# Patient Record
Sex: Male | Born: 1970 | Race: Black or African American | Hispanic: No | Marital: Single | Smoking: Never smoker
Health system: Southern US, Community
[De-identification: ages and names within clinical notes are randomized; demographics above are authoritative.]

## PROBLEM LIST (undated history)

## (undated) HISTORY — PX: OTHER SURGICAL HISTORY: SHX169

---

## 2012-07-11 ENCOUNTER — Encounter: Payer: Self-pay | Admitting: Internal Medicine

## 2012-07-31 ENCOUNTER — Encounter: Payer: Self-pay | Admitting: Internal Medicine

## 2012-08-01 ENCOUNTER — Other Ambulatory Visit (INDEPENDENT_AMBULATORY_CARE_PROVIDER_SITE_OTHER): Payer: PRIVATE HEALTH INSURANCE

## 2012-08-01 ENCOUNTER — Ambulatory Visit (INDEPENDENT_AMBULATORY_CARE_PROVIDER_SITE_OTHER): Payer: PRIVATE HEALTH INSURANCE | Admitting: Internal Medicine

## 2012-08-01 ENCOUNTER — Encounter: Payer: Self-pay | Admitting: Internal Medicine

## 2012-08-01 VITALS — BP 120/80 | HR 66 | Ht 62.0 in | Wt 175.8 lb

## 2012-08-01 DIAGNOSIS — R195 Other fecal abnormalities: Secondary | ICD-10-CM

## 2012-08-01 DIAGNOSIS — Z8719 Personal history of other diseases of the digestive system: Secondary | ICD-10-CM

## 2012-08-01 DIAGNOSIS — R140 Abdominal distension (gaseous): Secondary | ICD-10-CM

## 2012-08-01 DIAGNOSIS — R141 Gas pain: Secondary | ICD-10-CM

## 2012-08-01 LAB — TSH: TSH: 1.17 u[IU]/mL (ref 0.35–5.50)

## 2012-08-01 MED ORDER — RESTORA PO CAPS: 1.0000 | ORAL_CAPSULE | Freq: Every day | ORAL | Status: AC

## 2012-08-01 NOTE — Progress Notes (Signed)
Patient ID: Alex [name], male   DOB: Feb 11, 1971, 42 y.o.   MRN: [id number] HPI: Alex [name] is a 42 year old male with little past medical history who was seen in consultation at the request of Lannie Fields, FNP for evaluation of abdominal bloating and loose stools. The patient reports in late April 2014 he developed abdominal bloating and foul-smelling loose stools which lasted approximately 3 weeks. About a week into his symptoms he was seen in urgent care and submitted stool studies, which he never heard the results of. He was told that he may have developed a bacterial or viral infection, and was placed on clears for a few days, then a brat diet and then eventually returned to normal diet. He reports his abdominal bloating and foul-smelling stools have resolved entirely at this point. Overall symptoms lasted about one month. He is now having 2 formed brown stools daily without bloating, abdominal pain, diarrhea, or constipation. He denies rectal bleeding or melena. No fevers or chills. No nausea or vomiting. No significant heartburn, dysphagia or odynophagia.  There are no active problems to display for this patient.   Past Surgical History  Procedure Laterality Date  . Wisdon teeth      Current Outpatient Prescriptions  Medication Sig Dispense Refill  . Ascorbic Acid (VITAMIN C) 100 MG tablet Take by mouth daily.      . Multiple Vitamin CHEW Chew by mouth.      Marland Kitchen omeprazole (PRILOSEC) 40 MG capsule Take 40 mg by mouth daily.      . Probiotic Product (RESTORA) CAPS Take 1 capsule by mouth daily.  30 capsule  0   No current facility-administered medications for this visit.    No Known Allergies  Family History  Problem Relation Age of Onset  . Heart disease Father   . Heart disease Mother   . Breast cancer Mother     History  Substance Use Topics  . Smoking status: Never Smoker   . Smokeless tobacco: Never Used  . Alcohol Use: No    ROS: As per history of present  illness, otherwise negative  BP 120/80  Pulse 66  Ht 5\' 2"  (1.575 m)  Wt 175 lb 12.8 oz (79.742 kg)  BMI 32.15 kg/m2 Constitutional: Well-developed and well-nourished. No distress. HEENT: Normocephalic and atraumatic. Oropharynx is clear and moist. No oropharyngeal exudate. Conjunctivae are normal.  No scleral icterus. Neck: Neck supple. Trachea midline. Cardiovascular: Normal rate, regular rhythm and intact distal pulses. No M/R/G Pulmonary/chest: Effort normal and breath sounds normal. No wheezing, rales or rhonchi. Abdominal: Soft, nontender, nondistended. Bowel sounds active throughout. There are no masses palpable. No hepatosplenomegaly. Extremities: no clubbing, cyanosis, or edema Lymphadenopathy: No cervical adenopathy noted. Neurological: Alert and oriented to person place and time. Skin: Skin is warm and dry. No rashes noted. Psychiatric: Normal mood and affect. Behavior is normal.  ASSESSMENT/PLAN: 42 year old male with little past medical history who was seen in consultation at the request of Lannie Fields, FNP for evaluation of abdominal bloating and loose stools.   1.  Resolved gastroenteritis/loose stools/abd bloating -- it sounds that he likely had an infectious gastroenteritis, likely viral in nature. It also sounds that he had some postinfectious irritable bowel but lasted almost a month. His symptoms have entirely resolved at this point, nothing further is felt necessary. I have recommended a probiotic and given him samples of Restora, should the symptoms recur. I will also check a TSH, as we discussed thyroid dysfunction can sometimes cause GI symptoms, and  he also feels like he is metabolically slower last several years and having a hard time maintaining a normal weight. He has gained some pounds over the last several years. He can followup as needed.

## 2012-08-01 NOTE — Patient Instructions (Addendum)
Your physician has requested that you go to the basement for the following lab work before leaving today: TSH  We have given you samples of Restora. This puts good bacteria back into your intestines. You should take 1 capsule by mouth once daily. If this works well for you, it can be purchased over the counter.  Follow up as needed                                               We are excited to introduce MyChart, a new best-in-class service that provides you online access to important information in your electronic medical record. We want to make it easier for you to view your health information - all in one secure location - when and where you need it. We expect MyChart will enhance the quality of care and service we provide.  When you register for MyChart, you can:    View your test results.    Request appointments and receive appointment reminders via email.    Request medication renewals.    View your medical history, allergies, medications and immunizations.    Communicate with your physician's office through a password-protected site.    Conveniently print information such as your medication lists.  To find out if MyChart is right for you, please talk to a member of our clinical staff today. We will gladly answer your questions about this free health and wellness tool.  If you are age 83 or older and want a member of your family to have access to your record, you must provide written consent by completing a proxy form available at our office. Please speak to our clinical staff about guidelines regarding accounts for patients younger than age 67.  As you activate your MyChart account and need any technical assistance, please call the MyChart technical support line at [phone]) [phone]-CHART [phone]) or email your question to [email or web address]. If you email your question(s), please include your name, a return phone number and the best time to reach you.  If you have non-urgent  health-related questions, you can send a message to our office through MyChart at [email or web address]. If you have a medical emergency, call 911.  Thank you for using MyChart as your new health and wellness resource!   MyChart licensed from [employer],  4540-9811. Patents Pending.

## 2012-08-09 ENCOUNTER — Telehealth: Payer: Self-pay | Admitting: *Deleted

## 2012-08-09 NOTE — Telephone Encounter (Signed)
Message copied by Florene Glen on Fri Aug 09, 2012 11:24 AM ------      Message from: Beverley Fiedler      Created: Thu Aug 01, 2012  1:26 PM       Normal thyroid function ------

## 2012-08-09 NOTE — Telephone Encounter (Signed)
Mailed pt a letter with results.  

## 2013-03-27 ENCOUNTER — Ambulatory Visit: Payer: PRIVATE HEALTH INSURANCE | Admitting: Physician Assistant

## 2015-05-31 ENCOUNTER — Ambulatory Visit: Payer: Self-pay | Admitting: Family Medicine

## 2020-03-08 ENCOUNTER — Emergency Department (HOSPITAL_BASED_OUTPATIENT_CLINIC_OR_DEPARTMENT_OTHER): Payer: HRSA Program

## 2020-03-08 ENCOUNTER — Emergency Department (HOSPITAL_BASED_OUTPATIENT_CLINIC_OR_DEPARTMENT_OTHER)
Admission: EM | Admit: 2020-03-08 | Discharge: 2020-03-08 | Disposition: A | Discharge status: Home / Self Care | Payer: HRSA Program | Attending: Emergency Medicine | Admitting: Emergency Medicine

## 2020-03-08 ENCOUNTER — Encounter (HOSPITAL_BASED_OUTPATIENT_CLINIC_OR_DEPARTMENT_OTHER): Payer: Self-pay | Admitting: *Deleted

## 2020-03-08 ENCOUNTER — Other Ambulatory Visit: Payer: Self-pay

## 2020-03-08 DIAGNOSIS — J1282 Pneumonia due to coronavirus disease 2019: Secondary | ICD-10-CM | POA: Diagnosis not present

## 2020-03-08 DIAGNOSIS — R509 Fever, unspecified: Secondary | ICD-10-CM | POA: Diagnosis present

## 2020-03-08 DIAGNOSIS — U071 COVID-19: Secondary | ICD-10-CM

## 2020-03-08 LAB — RESP PANEL BY RT-PCR (FLU A&B, COVID) ARPGX2
Influenza A by PCR: NEGATIVE
Influenza B by PCR: NEGATIVE
SARS Coronavirus 2 by RT PCR: POSITIVE — AB

## 2020-03-08 MED ORDER — ONDANSETRON 4 MG PO TBDP: 4.0000 mg | ORAL_TABLET | Freq: Three times a day (TID) | ORAL | 0 refills | Status: AC | PRN

## 2020-03-08 NOTE — ED Notes (Signed)
Pulse ox with ambulation: 93% 108 HR

## 2020-03-08 NOTE — Discharge Instructions (Addendum)
Please follow isolation precautions as discussed.  You should remain in isolation for at least 10 days from onset of symptoms and should be symptom and fever free.  Take Tylenol and Motrin as needed for fevers and body aches.  If you develop any difficulty in breathing, chest pain or other new concerning symptom, return to ER for reassessment.

## 2020-03-08 NOTE — ED Triage Notes (Signed)
Fatigue, headache and fever from Thursday until last night.  Not vaccinated for Covid.

## 2020-03-08 NOTE — ED Provider Notes (Signed)
[facility] EMERGENCY DEPARTMENT Provider Note   CSN: [id number] Arrival date & time: 03/08/20  0753     History Chief Complaint  Patient presents with  . Fever  . Fatigue  . Headache    Alex [name] is a 49 y.o. male.  Presents to ER with concern for Covid symptoms.  Not vaccinated against Covid.  Since Thursday, patient has been having dull achy headache, fatigue, chills and low-grade fevers.  Unsure of his temperature.  Headache is not worsening of his life, not sudden onset.  Currently mild.  Having cough that is nonproductive and nonbloody.  Does not have any chest pain or shortness of breath associated with his symptoms at present.  Denies any medical problems.  Non-smoker.  HPI     History reviewed. No pertinent past medical history.  There are no problems to display for this patient.   Past Surgical History:  Procedure Laterality Date  . Wisdon teeth         Family History  Problem Relation Age of Onset  . Heart disease Father   . Heart disease Mother   . Breast cancer Mother     Social History   Tobacco Use  . Smoking status: Never Smoker  . Smokeless tobacco: Never Used  Substance Use Topics  . Alcohol use: Yes    Comment: occasionally  . Drug use: Never    Home Medications Prior to Admission medications   Medication Sig Start Date End Date Taking? Authorizing Provider  Ascorbic Acid (VITAMIN C) 100 MG tablet Take by mouth daily.    [provider]  Multiple Vitamin CHEW Chew by mouth.    [provider]  omeprazole (PRILOSEC) 40 MG capsule Take 40 mg by mouth daily.    [provider]  ondansetron (ZOFRAN ODT) 4 MG disintegrating tablet Take 1 tablet (4 mg total) by mouth every 8 (eight) hours as needed for nausea or vomiting. 03/08/20   Milagros Loll, MD  Probiotic Product (RESTORA) CAPS Take 1 capsule by mouth daily. 08/01/12   Pyrtle, Carie Caddy, MD    Allergies    Patient has no known  allergies.  Review of Systems   Review of Systems  Constitutional: Positive for chills. Negative for fever.  HENT: Negative for ear pain and sore throat.   Eyes: Negative for pain and visual disturbance.  Respiratory: Positive for cough and shortness of breath.   Cardiovascular: Negative for chest pain and palpitations.  Gastrointestinal: Negative for abdominal pain and vomiting.  Genitourinary: Negative for dysuria and hematuria.  Musculoskeletal: Negative for arthralgias and back pain.  Skin: Negative for color change and rash.  Neurological: Negative for seizures and syncope.  All other systems reviewed and are negative.   Physical Exam Updated Vital Signs BP (!) 141/90 (BP Location: Right Arm)   Pulse (!) 104   Temp 98.9 F (37.2 C) (Oral)   Resp 16   Ht 5\' 4"  (1.626 m)   Wt 72.6 kg   SpO2 94%   BMI 27.46 kg/m   Physical Exam Vitals and nursing note reviewed.  Constitutional:      Appearance: He is well-developed and well-nourished.  HENT:     Head: Normocephalic and atraumatic.  Eyes:     Conjunctiva/sclera: Conjunctivae normal.  Cardiovascular:     Rate and Rhythm: Normal rate and regular rhythm.     Heart sounds: No murmur heard.   Pulmonary:     Effort: Pulmonary effort is normal. No respiratory  distress.     Breath sounds: Normal breath sounds.  Abdominal:     Palpations: Abdomen is soft.     Tenderness: There is no abdominal tenderness.  Musculoskeletal:        General: No edema.     Cervical back: Neck supple.  Skin:    General: Skin is warm and dry.  Neurological:     Mental Status: He is alert.  Psychiatric:        Mood and Affect: Mood and affect and mood normal.        Behavior: Behavior normal.     ED Results / Procedures / Treatments   Labs (all labs ordered are listed, but only abnormal results are displayed) Labs Reviewed  RESP PANEL BY RT-PCR (FLU A&B, COVID) ARPGX2 - Abnormal; Notable for the following components:      Result  Value   SARS Coronavirus 2 by RT PCR POSITIVE (*)    All other components within normal limits    EKG None  Radiology DG Chest Portable 1 View  Result Date: 03/08/2020 CLINICAL DATA:  COVID, shortness of breath. EXAM: PORTABLE CHEST 1 VIEW COMPARISON:  None. FINDINGS: Bibasilar peripheral predominant airspace opacities. No visible pleural effusions or pneumothorax. Cardiac silhouette is accentuated by AP technique and low lung volumes. No acute osseous abnormality. IMPRESSION: Bibasilar peripheral predominant airspace opacities, compatible with multifocal pneumonia and reported COVID diagnosis. Electronically Signed   By: Feliberto Harts MD   On: 03/08/2020 08:42    Procedures Procedures (including critical care time)  Medications Ordered in ED Medications - No data to display  ED Course  I have reviewed the triage vital signs and the nursing notes.  Pertinent labs & imaging results that were available during my care of the patient were reviewed by me and considered in my medical decision making (see chart for details).    MDM Rules/Calculators/A&P                         49 year old male presenting to the emergency room with concern for cough, chills, fatigue and headache.  Covid test is positive.  CXR consistent with Covid pneumonia.  Presentation consistent with Covid.  On exam, patient currently is remarkably well-appearing.  Noted initial documented oxygen saturation of 91% on room air.  When I assessed patient O2 sats remained 93 to 94%.  Personally witnessed ambulation trial in room and patient had no desaturation episodes, and furthermore denies shortness of breath.  At present believe he is appropriate for outpatient management.  Reviewed strict return precautions should he develop worsening symptoms, any chest pain or any shortness of breath.  Patient demonstrated understanding as well as need for isolation precautions.  After the discussed management above, the patient was  determined to be safe for discharge.  The patient was in agreement with this plan and all questions regarding their care were answered.  ED return precautions were discussed and the patient will return to the ED with any significant worsening of condition.  Final Clinical Impression(s) / ED Diagnoses Final diagnoses:  COVID-19  Pneumonia due to COVID-19 virus    Rx / DC Orders ED Discharge Orders         Ordered    ondansetron (ZOFRAN ODT) 4 MG disintegrating tablet  Every 8 hours PRN        03/08/20 0940           Milagros Loll, MD 04-08-2020 [phone]

## 2020-03-08 NOTE — ED Notes (Signed)
ED Provider at bedside. 

## 2020-03-09 ENCOUNTER — Emergency Department (HOSPITAL_COMMUNITY): Payer: Medicaid Other

## 2020-03-09 ENCOUNTER — Inpatient Hospital Stay (HOSPITAL_COMMUNITY)
Admission: EM | Admit: 2020-03-09 | Discharge: 2020-06-11 | DRG: 003 | Disposition: E | Payer: Medicaid Other | Attending: Pulmonary Disease | Admitting: Pulmonary Disease

## 2020-03-09 ENCOUNTER — Encounter (HOSPITAL_COMMUNITY): Payer: Self-pay

## 2020-03-09 DIAGNOSIS — Z7189 Other specified counseling: Secondary | ICD-10-CM | POA: Diagnosis not present

## 2020-03-09 DIAGNOSIS — D6959 Other secondary thrombocytopenia: Secondary | ICD-10-CM | POA: Diagnosis present

## 2020-03-09 DIAGNOSIS — D6489 Other specified anemias: Secondary | ICD-10-CM | POA: Diagnosis present

## 2020-03-09 DIAGNOSIS — E861 Hypovolemia: Secondary | ICD-10-CM | POA: Diagnosis present

## 2020-03-09 DIAGNOSIS — J1282 Pneumonia due to coronavirus disease 2019: Secondary | ICD-10-CM

## 2020-03-09 DIAGNOSIS — X58XXXA Exposure to other specified factors, initial encounter: Secondary | ICD-10-CM | POA: Diagnosis not present

## 2020-03-09 DIAGNOSIS — Z9889 Other specified postprocedural states: Secondary | ICD-10-CM

## 2020-03-09 DIAGNOSIS — I2609 Other pulmonary embolism with acute cor pulmonale: Secondary | ICD-10-CM | POA: Diagnosis not present

## 2020-03-09 DIAGNOSIS — E871 Hypo-osmolality and hyponatremia: Secondary | ICD-10-CM | POA: Diagnosis present

## 2020-03-09 DIAGNOSIS — Z66 Do not resuscitate: Secondary | ICD-10-CM | POA: Diagnosis not present

## 2020-03-09 DIAGNOSIS — R1110 Vomiting, unspecified: Secondary | ICD-10-CM

## 2020-03-09 DIAGNOSIS — J9690 Respiratory failure, unspecified, unspecified whether with hypoxia or hypercapnia: Secondary | ICD-10-CM

## 2020-03-09 DIAGNOSIS — E1152 Type 2 diabetes mellitus with diabetic peripheral angiopathy with gangrene: Secondary | ICD-10-CM | POA: Diagnosis present

## 2020-03-09 DIAGNOSIS — Z79899 Other long term (current) drug therapy: Secondary | ICD-10-CM

## 2020-03-09 DIAGNOSIS — K567 Ileus, unspecified: Secondary | ICD-10-CM | POA: Diagnosis not present

## 2020-03-09 DIAGNOSIS — T85598A Other mechanical complication of other gastrointestinal prosthetic devices, implants and grafts, initial encounter: Secondary | ICD-10-CM

## 2020-03-09 DIAGNOSIS — J80 Acute respiratory distress syndrome: Secondary | ICD-10-CM | POA: Diagnosis present

## 2020-03-09 DIAGNOSIS — Z452 Encounter for adjustment and management of vascular access device: Secondary | ICD-10-CM

## 2020-03-09 DIAGNOSIS — N179 Acute kidney failure, unspecified: Secondary | ICD-10-CM | POA: Diagnosis present

## 2020-03-09 DIAGNOSIS — E870 Hyperosmolality and hypernatremia: Secondary | ICD-10-CM | POA: Diagnosis not present

## 2020-03-09 DIAGNOSIS — T790XXA Air embolism (traumatic), initial encounter: Secondary | ICD-10-CM | POA: Diagnosis not present

## 2020-03-09 DIAGNOSIS — F32A Depression, unspecified: Secondary | ICD-10-CM | POA: Diagnosis not present

## 2020-03-09 DIAGNOSIS — Z4659 Encounter for fitting and adjustment of other gastrointestinal appliance and device: Secondary | ICD-10-CM

## 2020-03-09 DIAGNOSIS — R451 Restlessness and agitation: Secondary | ICD-10-CM | POA: Diagnosis not present

## 2020-03-09 DIAGNOSIS — J9601 Acute respiratory failure with hypoxia: Secondary | ICD-10-CM | POA: Diagnosis present

## 2020-03-09 DIAGNOSIS — R319 Hematuria, unspecified: Secondary | ICD-10-CM | POA: Diagnosis not present

## 2020-03-09 DIAGNOSIS — J154 Pneumonia due to other streptococci: Secondary | ICD-10-CM | POA: Diagnosis not present

## 2020-03-09 DIAGNOSIS — Y832 Surgical operation with anastomosis, bypass or graft as the cause of abnormal reaction of the patient, or of later complication, without mention of misadventure at the time of the procedure: Secondary | ICD-10-CM | POA: Diagnosis not present

## 2020-03-09 DIAGNOSIS — J810 Acute pulmonary edema: Secondary | ICD-10-CM | POA: Diagnosis not present

## 2020-03-09 DIAGNOSIS — L8990 Pressure ulcer of unspecified site, unspecified stage: Secondary | ICD-10-CM | POA: Insufficient documentation

## 2020-03-09 DIAGNOSIS — E11649 Type 2 diabetes mellitus with hypoglycemia without coma: Secondary | ICD-10-CM | POA: Diagnosis not present

## 2020-03-09 DIAGNOSIS — K3184 Gastroparesis: Secondary | ICD-10-CM | POA: Diagnosis present

## 2020-03-09 DIAGNOSIS — I442 Atrioventricular block, complete: Secondary | ICD-10-CM | POA: Diagnosis present

## 2020-03-09 DIAGNOSIS — U071 COVID-19: Secondary | ICD-10-CM | POA: Diagnosis present

## 2020-03-09 DIAGNOSIS — I82622 Acute embolism and thrombosis of deep veins of left upper extremity: Secondary | ICD-10-CM | POA: Diagnosis present

## 2020-03-09 DIAGNOSIS — E8770 Fluid overload, unspecified: Secondary | ICD-10-CM | POA: Diagnosis not present

## 2020-03-09 DIAGNOSIS — Z4682 Encounter for fitting and adjustment of non-vascular catheter: Secondary | ICD-10-CM

## 2020-03-09 DIAGNOSIS — Z0189 Encounter for other specified special examinations: Secondary | ICD-10-CM

## 2020-03-09 DIAGNOSIS — Z978 Presence of other specified devices: Secondary | ICD-10-CM

## 2020-03-09 DIAGNOSIS — R198 Other specified symptoms and signs involving the digestive system and abdomen: Secondary | ICD-10-CM

## 2020-03-09 DIAGNOSIS — Z930 Tracheostomy status: Secondary | ICD-10-CM

## 2020-03-09 DIAGNOSIS — Z9911 Dependence on respirator [ventilator] status: Secondary | ICD-10-CM

## 2020-03-09 DIAGNOSIS — R339 Retention of urine, unspecified: Secondary | ICD-10-CM | POA: Diagnosis not present

## 2020-03-09 DIAGNOSIS — R0989 Other specified symptoms and signs involving the circulatory and respiratory systems: Secondary | ICD-10-CM

## 2020-03-09 DIAGNOSIS — Z515 Encounter for palliative care: Secondary | ICD-10-CM | POA: Diagnosis not present

## 2020-03-09 DIAGNOSIS — B954 Other streptococcus as the cause of diseases classified elsewhere: Secondary | ICD-10-CM | POA: Diagnosis present

## 2020-03-09 DIAGNOSIS — A4181 Sepsis due to Enterococcus: Secondary | ICD-10-CM | POA: Diagnosis not present

## 2020-03-09 DIAGNOSIS — G931 Anoxic brain damage, not elsewhere classified: Secondary | ICD-10-CM | POA: Diagnosis not present

## 2020-03-09 DIAGNOSIS — J95811 Postprocedural pneumothorax: Secondary | ICD-10-CM | POA: Diagnosis not present

## 2020-03-09 DIAGNOSIS — Z9689 Presence of other specified functional implants: Secondary | ICD-10-CM

## 2020-03-09 DIAGNOSIS — I82413 Acute embolism and thrombosis of femoral vein, bilateral: Secondary | ICD-10-CM | POA: Diagnosis present

## 2020-03-09 DIAGNOSIS — E1165 Type 2 diabetes mellitus with hyperglycemia: Secondary | ICD-10-CM | POA: Diagnosis present

## 2020-03-09 DIAGNOSIS — I468 Cardiac arrest due to other underlying condition: Secondary | ICD-10-CM | POA: Diagnosis not present

## 2020-03-09 DIAGNOSIS — B952 Enterococcus as the cause of diseases classified elsewhere: Secondary | ICD-10-CM

## 2020-03-09 DIAGNOSIS — R040 Epistaxis: Secondary | ICD-10-CM | POA: Diagnosis not present

## 2020-03-09 DIAGNOSIS — F419 Anxiety disorder, unspecified: Secondary | ICD-10-CM | POA: Diagnosis present

## 2020-03-09 DIAGNOSIS — J939 Pneumothorax, unspecified: Secondary | ICD-10-CM

## 2020-03-09 DIAGNOSIS — Z9281 Personal history of extracorporeal membrane oxygenation (ECMO): Secondary | ICD-10-CM

## 2020-03-09 DIAGNOSIS — J158 Pneumonia due to other specified bacteria: Secondary | ICD-10-CM | POA: Diagnosis not present

## 2020-03-09 DIAGNOSIS — E874 Mixed disorder of acid-base balance: Secondary | ICD-10-CM | POA: Diagnosis not present

## 2020-03-09 DIAGNOSIS — R7881 Bacteremia: Secondary | ICD-10-CM

## 2020-03-09 DIAGNOSIS — I10 Essential (primary) hypertension: Secondary | ICD-10-CM | POA: Diagnosis present

## 2020-03-09 DIAGNOSIS — R140 Abdominal distension (gaseous): Secondary | ICD-10-CM

## 2020-03-09 DIAGNOSIS — R092 Respiratory arrest: Secondary | ICD-10-CM

## 2020-03-09 DIAGNOSIS — L89152 Pressure ulcer of sacral region, stage 2: Secondary | ICD-10-CM | POA: Diagnosis not present

## 2020-03-09 DIAGNOSIS — S20212A Contusion of left front wall of thorax, initial encounter: Secondary | ICD-10-CM | POA: Diagnosis not present

## 2020-03-09 DIAGNOSIS — R0902 Hypoxemia: Secondary | ICD-10-CM

## 2020-03-09 DIAGNOSIS — E1143 Type 2 diabetes mellitus with diabetic autonomic (poly)neuropathy: Secondary | ICD-10-CM | POA: Diagnosis present

## 2020-03-09 DIAGNOSIS — E860 Dehydration: Secondary | ICD-10-CM | POA: Diagnosis present

## 2020-03-09 DIAGNOSIS — T859XXA Unspecified complication of internal prosthetic device, implant and graft, initial encounter: Secondary | ICD-10-CM

## 2020-03-09 DIAGNOSIS — Z01818 Encounter for other preprocedural examination: Secondary | ICD-10-CM

## 2020-03-09 LAB — BLOOD GAS, VENOUS
Acid-base deficit: 4.2 mmol/L — ABNORMAL HIGH (ref 0.0–2.0)
Bicarbonate: 21.2 mmol/L (ref 20.0–28.0)
O2 Saturation: 67.3 %
Patient temperature: 98.6
pCO2, Ven: 42 mmHg — ABNORMAL LOW (ref 44.0–60.0)
pH, Ven: 7.323 (ref 7.250–7.430)
pO2, Ven: 39.3 mmHg (ref 32.0–45.0)

## 2020-03-09 LAB — COMPREHENSIVE METABOLIC PANEL
ALT: 66 U/L — ABNORMAL HIGH (ref 0–44)
ALT: 70 U/L — ABNORMAL HIGH (ref 0–44)
AST: 76 U/L — ABNORMAL HIGH (ref 15–41)
AST: 96 U/L — ABNORMAL HIGH (ref 15–41)
Albumin: 3.8 g/dL (ref 3.5–5.0)
Albumin: 3.4 g/dL — ABNORMAL LOW (ref 3.5–5.0)
Alkaline Phosphatase: 84 U/L (ref 38–126)
Alkaline Phosphatase: 94 U/L (ref 38–126)
Anion gap: 20 — ABNORMAL HIGH (ref 5–15)
Anion gap: 17 — ABNORMAL HIGH (ref 5–15)
BUN: 13 mg/dL (ref 6–20)
BUN: 15 mg/dL (ref 6–20)
CO2: 21 mmol/L — ABNORMAL LOW (ref 22–32)
CO2: 20 mmol/L — ABNORMAL LOW (ref 22–32)
Calcium: 8.8 mg/dL — ABNORMAL LOW (ref 8.9–10.3)
Calcium: 8.1 mg/dL — ABNORMAL LOW (ref 8.9–10.3)
Chloride: 84 mmol/L — ABNORMAL LOW (ref 98–111)
Chloride: 91 mmol/L — ABNORMAL LOW (ref 98–111)
Creatinine, Ser: 1.22 mg/dL (ref 0.61–1.24)
Creatinine, Ser: 1.15 mg/dL (ref 0.61–1.24)
GFR, Estimated: >60 mL/min (ref >60)
GFR, Estimated: >60 mL/min (ref >60)
Glucose, Bld: 335 mg/dL — ABNORMAL HIGH (ref 70–99)
Glucose, Bld: 365 mg/dL — ABNORMAL HIGH (ref 70–99)
Potassium: 4.3 mmol/L (ref 3.5–5.1)
Potassium: 3.9 mmol/L (ref 3.5–5.1)
Sodium: 125 mmol/L — ABNORMAL LOW (ref 135–145)
Sodium: 128 mmol/L — ABNORMAL LOW (ref 135–145)
Total Bilirubin: 1.3 mg/dL — ABNORMAL HIGH (ref 0.3–1.2)
Total Bilirubin: 1.3 mg/dL — ABNORMAL HIGH (ref 0.3–1.2)
Total Protein: 7.7 g/dL (ref 6.5–8.1)
Total Protein: 7.5 g/dL (ref 6.5–8.1)

## 2020-03-09 LAB — CBC WITH DIFFERENTIAL/PLATELET
Abs Immature Granulocytes: 0.01 10*3/uL (ref 0.00–0.07)
Basophils Absolute: 0 10*3/uL (ref 0.0–0.1)
Basophils Relative: 0 %
Eosinophils Absolute: 0 10*3/uL (ref 0.0–0.5)
Eosinophils Relative: 0 %
HCT: 45.2 % (ref 39.0–52.0)
Hemoglobin: 15.7 g/dL (ref 13.0–17.0)
Immature Granulocytes: 0 %
Lymphocytes Relative: 5 %
Lymphs Abs: 0.3 10*3/uL — ABNORMAL LOW (ref 0.7–4.0)
MCH: 29.1 pg (ref 26.0–34.0)
MCHC: 34.7 g/dL (ref 30.0–36.0)
MCV: 83.7 fL (ref 80.0–100.0)
Monocytes Absolute: 0.1 10*3/uL (ref 0.1–1.0)
Monocytes Relative: 2 %
Neutro Abs: 5.9 10*3/uL (ref 1.7–7.7)
Neutrophils Relative %: 93 %
Platelets: 259 10*3/uL (ref 150–400)
RBC: 5.4 MIL/uL (ref 4.22–5.81)
RDW: 11.8 % (ref 11.5–15.5)
WBC Morphology: INCREASED
WBC: 6.3 10*3/uL (ref 4.0–10.5)
nRBC: 0 % (ref 0.0–0.2)

## 2020-03-09 LAB — C-REACTIVE PROTEIN: CRP: 26.8 mg/dL — ABNORMAL HIGH (ref <1.0)

## 2020-03-09 LAB — GLUCOSE, CAPILLARY
Glucose-Capillary: 401 mg/dL — ABNORMAL HIGH (ref 70–99)
Glucose-Capillary: 331 mg/dL — ABNORMAL HIGH (ref 70–99)
Glucose-Capillary: 206 mg/dL — ABNORMAL HIGH (ref 70–99)
Glucose-Capillary: 202 mg/dL — ABNORMAL HIGH (ref 70–99)
Glucose-Capillary: 209 mg/dL — ABNORMAL HIGH (ref 70–99)

## 2020-03-09 LAB — HEMOGLOBIN A1C
Hgb A1c MFr Bld: 12.2 % — ABNORMAL HIGH (ref 4.8–5.6)
Hgb A1c MFr Bld: 11.8 % — ABNORMAL HIGH (ref 4.8–5.6)
Mean Plasma Glucose: 303.44 mg/dL
Mean Plasma Glucose: 291.96 mg/dL

## 2020-03-09 LAB — BASIC METABOLIC PANEL
Anion gap: 12 (ref 5–15)
BUN: 16 mg/dL (ref 6–20)
CO2: 22 mmol/L (ref 22–32)
Calcium: 7.9 mg/dL — ABNORMAL LOW (ref 8.9–10.3)
Chloride: 94 mmol/L — ABNORMAL LOW (ref 98–111)
Creatinine, Ser: 0.95 mg/dL (ref 0.61–1.24)
GFR, Estimated: >60 mL/min (ref >60)
Glucose, Bld: 209 mg/dL — ABNORMAL HIGH (ref 70–99)
Potassium: 4.1 mmol/L (ref 3.5–5.1)
Sodium: 128 mmol/L — ABNORMAL LOW (ref 135–145)

## 2020-03-09 LAB — OSMOLALITY: Osmolality: 291 mOsm/kg (ref 275–295)

## 2020-03-09 LAB — HEPATITIS PANEL, ACUTE
HCV Ab: NONREACTIVE
Hep A IgM: NONREACTIVE
Hep B C IgM: NONREACTIVE
Hepatitis B Surface Ag: NONREACTIVE

## 2020-03-09 LAB — FERRITIN: Ferritin: 915 ng/mL — ABNORMAL HIGH (ref 24–336)

## 2020-03-09 LAB — MRSA PCR SCREENING: MRSA by PCR: NEGATIVE

## 2020-03-09 LAB — FIBRINOGEN: Fibrinogen: >800 mg/dL — ABNORMAL HIGH (ref 210–475)

## 2020-03-09 LAB — PROCALCITONIN: Procalcitonin: 18.38 ng/mL

## 2020-03-09 LAB — TRIGLYCERIDES: Triglycerides: 137 mg/dL (ref <150)

## 2020-03-09 LAB — LACTIC ACID, PLASMA: Lactic Acid, Venous: 1.4 mmol/L (ref 0.5–1.9)

## 2020-03-09 LAB — D-DIMER, QUANTITATIVE: D-Dimer, Quant: 2.64 ug/mL-FEU — ABNORMAL HIGH (ref 0.00–0.50)

## 2020-03-09 LAB — BETA-HYDROXYBUTYRIC ACID: Beta-Hydroxybutyric Acid: 4.92 mmol/L — ABNORMAL HIGH (ref 0.05–0.27)

## 2020-03-09 LAB — LACTATE DEHYDROGENASE: LDH: 517 U/L — ABNORMAL HIGH (ref 98–192)

## 2020-03-09 MED ORDER — INSULIN REGULAR(HUMAN) IN NACL 100-0.9 UT/100ML-% IV SOLN
INTRAVENOUS | Status: DC
  Administered 2020-03-09: 20:00:00 14 [IU]/h via INTRAVENOUS
  Filled 2020-03-09 (×2): qty 100

## 2020-03-09 MED ORDER — ONDANSETRON HCL 4 MG PO TABS: 4.0000 mg | ORAL_TABLET | Freq: Four times a day (QID) | ORAL | Status: DC | PRN

## 2020-03-09 MED ORDER — ONDANSETRON HCL 4 MG/2ML IJ SOLN: 4.0000 mg | Freq: Four times a day (QID) | INTRAMUSCULAR | Status: DC | PRN

## 2020-03-09 MED ORDER — PROCHLORPERAZINE EDISYLATE 10 MG/2ML IJ SOLN
10.0000 mg | Freq: Once | INTRAMUSCULAR | Status: AC
  Administered 2020-03-09: 17:00:00 10 mg via INTRAVENOUS
  Filled 2020-03-09: qty 2

## 2020-03-09 MED ORDER — ORAL CARE MOUTH RINSE
15.0000 mL | Freq: Two times a day (BID) | OROMUCOSAL | Status: DC
  Administered 2020-03-10 (×2): 15 mL via OROMUCOSAL

## 2020-03-09 MED ORDER — DEXTROSE 50 % IV SOLN: 0.0000 mL | INTRAVENOUS | Status: DC | PRN

## 2020-03-09 MED ORDER — ACETAMINOPHEN 325 MG PO TABS: 650.0000 mg | ORAL_TABLET | Freq: Once | ORAL | Status: DC | PRN

## 2020-03-09 MED ORDER — SODIUM CHLORIDE 0.9 % IV SOLN
100.0000 mg | Freq: Every day | INTRAVENOUS | Status: AC
  Administered 2020-03-10 – 2020-03-13 (×4): 100 mg via INTRAVENOUS
  Filled 2020-03-09 (×2): qty 20
  Filled 2020-03-09: qty 100
  Filled 2020-03-09: qty 20

## 2020-03-09 MED ORDER — PREDNISONE 20 MG PO TABS
50.0000 mg | ORAL_TABLET | Freq: Every day | ORAL | Status: DC
  Administered 2020-03-13 – 2020-03-15 (×3): 50 mg via ORAL
  Filled 2020-03-09 (×3): qty 2

## 2020-03-09 MED ORDER — METHYLPREDNISOLONE SODIUM SUCC 40 MG IJ SOLR
40.0000 mg | Freq: Once | INTRAMUSCULAR | Status: AC
  Administered 2020-03-09: 08:00:00 40 mg via INTRAVENOUS
  Filled 2020-03-09: qty 1

## 2020-03-09 MED ORDER — SODIUM CHLORIDE 0.9 % IV SOLN: INTRAVENOUS | Status: DC

## 2020-03-09 MED ORDER — HYDROCOD POLST-CPM POLST ER 10-8 MG/5ML PO SUER
5.0000 mL | Freq: Two times a day (BID) | ORAL | Status: DC | PRN
  Administered 2020-03-15 (×2): 5 mL via ORAL
  Filled 2020-03-09 (×2): qty 5

## 2020-03-09 MED ORDER — ACETAMINOPHEN 325 MG PO TABS
650.0000 mg | ORAL_TABLET | Freq: Four times a day (QID) | ORAL | Status: DC | PRN
  Filled 2020-03-09: qty 2

## 2020-03-09 MED ORDER — LACTATED RINGERS IV BOLUS
20.0000 mL/kg | Freq: Once | INTRAVENOUS | Status: AC
  Administered 2020-03-09: 20:00:00 1442 mL via INTRAVENOUS

## 2020-03-09 MED ORDER — DEXTROSE IN LACTATED RINGERS 5 % IV SOLN: INTRAVENOUS | Status: DC

## 2020-03-09 MED ORDER — LACTATED RINGERS IV SOLN: INTRAVENOUS | Status: DC

## 2020-03-09 MED ORDER — ENOXAPARIN SODIUM 40 MG/0.4ML ~~LOC~~ SOLN
40.0000 mg | SUBCUTANEOUS | Status: DC
  Administered 2020-03-09 – 2020-03-10 (×2): 40 mg via SUBCUTANEOUS
  Filled 2020-03-09 (×2): qty 0.4

## 2020-03-09 MED ORDER — REMDESIVIR 100 MG IV SOLR
200.0000 mg | Freq: Once | INTRAVENOUS | Status: DC
Start: 2020-03-09 — End: 2020-03-09

## 2020-03-09 MED ORDER — SODIUM CHLORIDE 0.9 % IV SOLN
500.0000 mg | Freq: Once | INTRAVENOUS | Status: AC
  Administered 2020-03-09: 11:00:00 500 mg via INTRAVENOUS
  Filled 2020-03-09: qty 500

## 2020-03-09 MED ORDER — TRAMADOL HCL 50 MG PO TABS
50.0000 mg | ORAL_TABLET | Freq: Two times a day (BID) | ORAL | Status: DC | PRN
  Administered 2020-03-09: 50 mg via ORAL
  Filled 2020-03-09: qty 1

## 2020-03-09 MED ORDER — SODIUM CHLORIDE 0.9 % IV SOLN: 100.0000 mg | Freq: Every day | INTRAVENOUS | Status: DC

## 2020-03-09 MED ORDER — BARICITINIB 2 MG PO TABS: 4.0000 mg | ORAL_TABLET | Freq: Every day | ORAL | Status: DC

## 2020-03-09 MED ORDER — ONDANSETRON HCL 4 MG/2ML IJ SOLN
4.0000 mg | Freq: Once | INTRAMUSCULAR | Status: AC
  Administered 2020-03-09: 08:00:00 4 mg via INTRAVENOUS
  Filled 2020-03-09: qty 2

## 2020-03-09 MED ORDER — SODIUM CHLORIDE 0.9 % IV SOLN
200.0000 mg | Freq: Once | INTRAVENOUS | Status: AC
  Administered 2020-03-09: 13:00:00 200 mg via INTRAVENOUS
  Filled 2020-03-09: qty 200

## 2020-03-09 MED ORDER — ASCORBIC ACID 500 MG PO TABS
500.0000 mg | ORAL_TABLET | Freq: Every day | ORAL | Status: DC
  Administered 2020-03-09 – 2020-03-10 (×2): 500 mg via ORAL
  Filled 2020-03-09 (×2): qty 1

## 2020-03-09 MED ORDER — CHLORHEXIDINE GLUCONATE 0.12 % MT SOLN
15.0000 mL | Freq: Two times a day (BID) | OROMUCOSAL | Status: DC
  Filled 2020-03-09: qty 15

## 2020-03-09 MED ORDER — INSULIN ASPART 100 UNIT/ML ~~LOC~~ SOLN
0.0000 [IU] | Freq: Three times a day (TID) | SUBCUTANEOUS | Status: DC
  Administered 2020-03-09: 17:00:00 15 [IU] via SUBCUTANEOUS

## 2020-03-09 MED ORDER — LACTATED RINGERS IV BOLUS
1000.0000 mL | Freq: Once | INTRAVENOUS | Status: AC
  Administered 2020-03-09: 08:00:00 1000 mL via INTRAVENOUS

## 2020-03-09 MED ORDER — ORAL CARE MOUTH RINSE
15.0000 mL | Freq: Two times a day (BID) | OROMUCOSAL | Status: DC
  Administered 2020-03-10: 16:00:00 15 mL via OROMUCOSAL

## 2020-03-09 MED ORDER — INSULIN ASPART 100 UNIT/ML ~~LOC~~ SOLN: 0.0000 [IU] | Freq: Every day | SUBCUTANEOUS | Status: DC

## 2020-03-09 MED ORDER — SODIUM CHLORIDE 0.9 % IV SOLN
1.0000 g | Freq: Once | INTRAVENOUS | Status: AC
  Administered 2020-03-09: 10:00:00 1 g via INTRAVENOUS
  Filled 2020-03-09: qty 10

## 2020-03-09 MED ORDER — INSULIN REGULAR(HUMAN) IN NACL 100-0.9 UT/100ML-% IV SOLN
INTRAVENOUS | Status: DC
  Filled 2020-03-09: qty 100

## 2020-03-09 MED ORDER — CHLORHEXIDINE GLUCONATE CLOTH 2 % EX PADS
6.0000 | MEDICATED_PAD | Freq: Every day | CUTANEOUS | Status: DC
  Administered 2020-03-09 – 2020-03-22 (×13): 6 via TOPICAL

## 2020-03-09 MED ORDER — ACETAMINOPHEN 500 MG PO TABS
1000.0000 mg | ORAL_TABLET | Freq: Once | ORAL | Status: AC
  Administered 2020-03-09: 08:00:00 1000 mg via ORAL
  Filled 2020-03-09: qty 2

## 2020-03-09 MED ORDER — IPRATROPIUM-ALBUTEROL 20-100 MCG/ACT IN AERS
1.0000 | INHALATION_SPRAY | Freq: Four times a day (QID) | RESPIRATORY_TRACT | Status: DC
  Administered 2020-03-09 – 2020-03-10 (×5): 1 via RESPIRATORY_TRACT
  Filled 2020-03-09: qty 4

## 2020-03-09 MED ORDER — INSULIN GLARGINE 100 UNIT/ML ~~LOC~~ SOLN
10.0000 [IU] | Freq: Once | SUBCUTANEOUS | Status: AC
  Administered 2020-03-09: 13:00:00 10 [IU] via SUBCUTANEOUS
  Filled 2020-03-09: qty 0.1

## 2020-03-09 MED ORDER — GUAIFENESIN-DM 100-10 MG/5ML PO SYRP
10.0000 mL | ORAL_SOLUTION | ORAL | Status: DC | PRN
  Administered 2020-03-09: 15:00:00 10 mL via ORAL
  Filled 2020-03-09: qty 10

## 2020-03-09 MED ORDER — METHYLPREDNISOLONE SODIUM SUCC 125 MG IJ SOLR
40.0000 mg | Freq: Two times a day (BID) | INTRAMUSCULAR | Status: AC
  Administered 2020-03-09 – 2020-03-12 (×6): 40 mg via INTRAVENOUS
  Filled 2020-03-09 (×6): qty 2

## 2020-03-09 MED ORDER — ZINC SULFATE 220 (50 ZN) MG PO CAPS
220.0000 mg | ORAL_CAPSULE | Freq: Every day | ORAL | Status: DC
  Administered 2020-03-09 – 2020-03-10 (×2): 220 mg via ORAL
  Filled 2020-03-09 (×2): qty 1

## 2020-03-09 MED ORDER — POTASSIUM CHLORIDE 10 MEQ/100ML IV SOLN
10.0000 meq | INTRAVENOUS | Status: AC
  Administered 2020-03-09 (×2): 10 meq via INTRAVENOUS
  Filled 2020-03-09 (×2): qty 100

## 2020-03-09 NOTE — ED Notes (Signed)
Pt refuses to put on gown. 

## 2020-03-09 NOTE — ED Triage Notes (Signed)
Pt states that he's not been able to keep anything down since Thursday and it's getting worse, he also has low O2 sats, he doesn't require oxygen at home but is currently on 4 liters to have a sat 93

## 2020-03-09 NOTE — ED Notes (Signed)
Pt request pillow to lean on bedside table. States he breathes much better sitting up and leaning over. Will continue to monitor.

## 2020-03-09 NOTE — ED Notes (Signed)
Recheck on pt temp. Pt oral temp is 101.1.

## 2020-03-09 NOTE — ED Notes (Signed)
Report called to Heather RN

## 2020-03-09 NOTE — ED Notes (Signed)
RT called to place pt on high flow per Dr Criss Alvine.

## 2020-03-09 NOTE — ED Notes (Signed)
Ua collected at bedside incase of order.

## 2020-03-09 NOTE — ED Provider Notes (Signed)
[facility]-EMERGENCY DEPT Provider Note   CSN: [id number] Arrival date & time: 03-16-2020  6213     History No chief complaint on file.   Alex [name] is a 49 y.o. male.  HPI 49 year old male presents with intractable vomiting.  He was diagnosed with COVID yesterday at [facility]  Endorses cough, fever, headache, vomiting and overall not feeling well.  Overall his symptoms have been about 4 days.  No diarrhea or abdominal pain.  Prior to me seeing him, his O2 sats were in the 80s and he is now on 4 L.   History reviewed. No pertinent past medical history.  Patient Active Problem List   Diagnosis Date Noted  . COVID-19 03/16/20    Past Surgical History:  Procedure Laterality Date  . Wisdon teeth         Family History  Problem Relation Age of Onset  . Heart disease Father   . Heart disease Mother   . Breast cancer Mother     Social History   Tobacco Use  . Smoking status: Never Smoker  . Smokeless tobacco: Never Used  Substance Use Topics  . Alcohol use: Yes    Comment: occasionally  . Drug use: Never    Home Medications Prior to Admission medications   Medication Sig Start Date End Date Taking? Authorizing Provider  acetaminophen (TYLENOL) 500 MG tablet Take 1,000 mg by mouth every 6 (six) hours as needed for mild pain.   Yes [provider]  Ascorbic Acid (VITAMIN C) 100 MG tablet Take by mouth daily.   Yes [provider]  guaiFENesin (MUCINEX) 600 MG 12 hr tablet Take 600 mg by mouth 2 (two) times daily as needed for cough or to loosen phlegm.   Yes [provider]  omeprazole (PRILOSEC) 40 MG capsule Take 40 mg by mouth daily as needed (heartburn).   Yes [provider]  ondansetron (ZOFRAN ODT) 4 MG disintegrating tablet Take 1 tablet (4 mg total) by mouth every 8 (eight) hours as needed for nausea or vomiting. 03/08/20   Milagros Loll, MD  Probiotic Product (RESTORA) CAPS Take  1 capsule by mouth daily. Patient not taking: Reported on 03-16-2020 08/01/12   Pyrtle, Carie Caddy, MD    Allergies    Patient has no known allergies.  Review of Systems   Review of Systems  Constitutional: Positive for fever.  Respiratory: Positive for cough and shortness of breath.   Gastrointestinal: Positive for vomiting. Negative for abdominal pain.  Neurological: Positive for headaches.  All other systems reviewed and are negative.   Physical Exam Updated Vital Signs BP (!) 140/102 (BP Location: Right Arm)   Pulse 99   Temp 98.8 F (37.1 C) (Oral)   Resp (!) 29   Ht 5\' 4"  (1.626 m)   Wt 72.1 kg   SpO2 91%   BMI 27.29 kg/m   Physical Exam Vitals and nursing note reviewed.  Constitutional:      General: He is not in acute distress.    Appearance: He is well-developed and well-nourished. He is not ill-appearing or diaphoretic.  HENT:     Head: Normocephalic and atraumatic.     Right Ear: External ear normal.     Left Ear: External ear normal.     Nose: Nose normal.  Eyes:     General:        Right eye: No discharge.        Left eye: No discharge.  Cardiovascular:     Rate and Rhythm: Regular rhythm. Tachycardia present.     Heart sounds: Normal heart sounds.  Pulmonary:     Effort: Pulmonary effort is normal. Tachypnea present. No accessory muscle usage or respiratory distress.     Breath sounds: Examination of the right-lower field reveals rales. Examination of the left-lower field reveals rales. Rales present.  Abdominal:     General: There is no distension.     Palpations: Abdomen is soft.     Tenderness: There is no abdominal tenderness.  Musculoskeletal:        General: No edema.     Cervical back: Neck supple.  Skin:    General: Skin is warm and dry.  Neurological:     Mental Status: He is alert.  Psychiatric:        Mood and Affect: Mood is not anxious.     ED Results / Procedures / Treatments   Labs (all labs ordered are listed, but only  abnormal results are displayed) Labs Reviewed  CBC WITH DIFFERENTIAL/PLATELET - Abnormal; Notable for the following components:      Result Value   Lymphs Abs 0.3 (*)    All other components within normal limits  COMPREHENSIVE METABOLIC PANEL - Abnormal; Notable for the following components:   Sodium 125 (*)    Chloride 84 (*)    CO2 21 (*)    Glucose, Bld 335 (*)    Calcium 8.8 (*)    AST 76 (*)    ALT 66 (*)    Total Bilirubin 1.3 (*)    Anion gap 20 (*)    All other components within normal limits  D-DIMER, QUANTITATIVE (NOT AT [facility]) - Abnormal; Notable for the following components:   D-Dimer, Quant 2.64 (*)    All other components within normal limits  LACTATE DEHYDROGENASE - Abnormal; Notable for the following components:   LDH 517 (*)    All other components within normal limits  FERRITIN - Abnormal; Notable for the following components:   Ferritin 915 (*)    All other components within normal limits  FIBRINOGEN - Abnormal; Notable for the following components:   Fibrinogen >800 (*)    All other components within normal limits  C-REACTIVE PROTEIN - Abnormal; Notable for the following components:   CRP 26.8 (*)    All other components within normal limits  CULTURE, BLOOD (ROUTINE X 2)  CULTURE, BLOOD (ROUTINE X 2)  LACTIC ACID, PLASMA  PROCALCITONIN  TRIGLYCERIDES  LACTIC ACID, PLASMA    EKG EKG Interpretation  Date/Time:  Tuesday March 09 2020 07:41:53 EST Ventricular Rate:  115 PR Interval:    QRS Duration: 92 QT Interval:  321 QTC Calculation: 444 R Axis:   -54 Text Interpretation: Sinus tachycardia Probable left atrial enlargement Left anterior fascicular block RSR' in V1 or V2, right VCD or RVH ST elev, probable normal early repol pattern No old tracing to compare Confirmed by Pricilla Loveless [phone]) on 02/29/2020 8:43:09 AM   Radiology DG Chest 2 View  Result Date: 02/19/2020 CLINICAL DATA:  49 year old male with chest pain and shortness of  breath for 5 days. Nausea vomiting. Positive for COVID-19 yesterday. EXAM: CHEST - 2 VIEW COMPARISON:  Portable chest 03/08/2020. FINDINGS: Widespread patchy but indistinct bilateral mid and lower lung opacity. On the lateral view middle and lower lobe involvement is suggested. Left lung opacity does appear increased since yesterday. No superimposed pneumothorax or pleural effusion. Mediastinal contours remain within normal limits. Visualized tracheal  air column is within normal limits. No osseous abnormality identified. Negative visible bowel gas pattern. IMPRESSION: Bilateral COVID-19 pneumonia with mild progression since yesterday. No pleural effusion. Electronically Signed   By: Odessa Fleming M.D.   On: March 28, 2020 07:33   DG Chest Portable 1 View  Result Date: 03/08/2020 CLINICAL DATA:  COVID, shortness of breath. EXAM: PORTABLE CHEST 1 VIEW COMPARISON:  None. FINDINGS: Bibasilar peripheral predominant airspace opacities. No visible pleural effusions or pneumothorax. Cardiac silhouette is accentuated by AP technique and low lung volumes. No acute osseous abnormality. IMPRESSION: Bibasilar peripheral predominant airspace opacities, compatible with multifocal pneumonia and reported COVID diagnosis. Electronically Signed   By: Feliberto Harts MD   On: 03/08/2020 08:42    Procedures .Critical Care Performed by: Pricilla Loveless, MD Authorized by: Pricilla Loveless, MD   Critical care provider statement:    Critical care time (minutes):  35   Critical care time was exclusive of:  Separately billable procedures and treating other patients   Critical care was necessary to treat or prevent imminent or life-threatening deterioration of the following conditions:  Respiratory failure   Critical care was time spent personally by me on the following activities:  Discussions with consultants, evaluation of patient's response to treatment, examination of patient, ordering and performing treatments and interventions,  ordering and review of laboratory studies, ordering and review of radiographic studies, pulse oximetry, re-evaluation of patient's condition, obtaining history from patient or surrogate and review of old charts   (including critical care time)  Medications Ordered in ED Medications  ondansetron (ZOFRAN) injection 4 mg (4 mg Intravenous Given 03/28/20 0826)  lactated ringers bolus 1,000 mL (0 mLs Intravenous Stopped 2020/03/28 0918)  acetaminophen (TYLENOL) tablet 1,000 mg (1,000 mg Oral Given 03-28-20 0826)  methylPREDNISolone sodium succinate (SOLU-MEDROL) 40 mg/mL injection 40 mg (40 mg Intravenous Given 28-Mar-2020 0826)  cefTRIAXone (ROCEPHIN) 1 g in sodium chloride 0.9 % 100 mL IVPB (0 g Intravenous Stopped 2020/03/28 1054)  azithromycin (ZITHROMAX) 500 mg in sodium chloride 0.9 % 250 mL IVPB (0 mg Intravenous Stopped March 28, 2020 1200)    ED Course  I have reviewed the triage vital signs and the nursing notes.  Pertinent labs & imaging results that were available during my care of the patient were reviewed by me and considered in my medical decision making (see chart for details).    MDM Rules/Calculators/A&P                          Patient has some tachypnea but no real increased work of breathing or distress.  However he is hypoxic on 4 L.  Chest x-ray worse than yesterday.  Given fluids given the poor p.o. intake and given Tylenol.  His oxygen requirement has progressively increased throughout the day though no significant increased work of breathing or distress.  Still maintaining airway easily.  At 1 point went up to a nonrebreather but respiratory has been called and patient is now on a high flow nasal cannula doing better.  I discussed with Dr. Ronaldo Miyamoto of hospitalist service who asked for pneumonia antibiotics because of the elevated procalcitonin.  Patient will need admission.  Given Solu-Medrol IV.  Alex [name] was evaluated in Emergency Department on 2020-03-28 for the symptoms  described in the history of present illness. He was evaluated in the context of the global COVID-19 pandemic, which necessitated consideration that the patient might be at risk for infection with the SARS-CoV-2 virus that causes COVID-19. Institutional  protocols and algorithms that pertain to the evaluation of patients at risk for COVID-19 are in a state of rapid change based on information released by regulatory bodies including the CDC and federal and state organizations. These policies and algorithms were followed during the patient's care in the ED.  Final Clinical Impression(s) / ED Diagnoses Final diagnoses:  Acute respiratory failure with hypoxia (HCC)  Pneumonia due to COVID-19 virus    Rx / DC Orders ED Discharge Orders    None       Pricilla LovelessGoldston, Tynia Wiers, MD 06-09-19 1214

## 2020-03-09 NOTE — ED Notes (Signed)
Pt unable to maintain O2 sats on [place], switched to NRB.

## 2020-03-09 NOTE — Progress Notes (Signed)
Glucose continues to rise despite 10 of lantus. No previous Hx of DM, but he's got an elevated beta-hydroxybutyric acid. A1c is pending. Will start Endotool and move to SDU.

## 2020-03-09 NOTE — H&P (Addendum)
History and Physical    Aravind Chrismer PQZ:[id number] DOB: September 11, 1970 DOA: March 28, 2020  PCP: Patient, No Pcp Per  Patient coming from: Home  Chief Complaint: Cough, N/V.  HPI: Kevontay Burks is a 49 y.o. male with no significant medical history significant. Presenting with dyspnea, cough, N/V. He reports that his symptoms began with cough 1 week ago. He tried some OTC meds but nothing help. Symptoms worsened to include body aches, fatigue, N/V 3 days later. Continued with OTC meds and fluids, but nothing seemed to help. He went to his PCP yesterday and got tested for COVID. He was positive. He went home with supportive care. This morning he woke up unable to breathe. He became concerned and came to the ED.   ED Course: Found he was COVID positive. Started on steroids. TRH was called for admission.   Review of Systems: Review of systems is otherwise negative for all not mentioned in HPI.   PMHx History reviewed. No pertinent past medical history.  PSHx Past Surgical History:  Procedure Laterality Date  . Wisdon teeth      SocHx  reports that he has never smoked. He has never used smokeless tobacco. He reports current alcohol use. He reports that he does not use drugs.  No Known Allergies  FamHx Family History  Problem Relation Age of Onset  . Heart disease Father   . Heart disease Mother   . Breast cancer Mother     Prior to Admission medications   Medication Sig Start Date End Date Taking? Authorizing Provider  Ascorbic Acid (VITAMIN C) 100 MG tablet Take by mouth daily.    [provider]  Multiple Vitamin CHEW Chew by mouth.    [provider]  omeprazole (PRILOSEC) 40 MG capsule Take 40 mg by mouth daily.    [provider]  ondansetron (ZOFRAN ODT) 4 MG disintegrating tablet Take 1 tablet (4 mg total) by mouth every 8 (eight) hours as needed for nausea or vomiting. 03/08/20   Milagros Loll, MD  Probiotic Product (RESTORA) CAPS Take 1  capsule by mouth daily. 08/01/12   Beverley Fiedler, MD    Physical Exam: Vitals:   03-28-2020 0909 2020/03/28 0930 2020/03/28 0952 2020-03-28 1052  BP: (!) 145/96 128/81 128/81 (!) 140/102  Pulse: (!) 125 (!) 106 (!) 106 99  Resp: (!) 21 20 (!) 30 (!) 29  Temp:    98.8 F (37.1 C)  TempSrc:    Oral  SpO2: (!) 84% 95% 92% 91%  Weight:      Height:        General: 49 y.o. male resting in bed in NAD Eyes: PERRL, normal sclera ENMT: Nares patent w/o discharge, orophaynx clear, dentition normal, ears w/o discharge/lesions/ulcers Neck: Supple, trachea midline Cardiovascular: RRR, +S1, S2, no m/g/r, equal pulses throughout Respiratory: decreased at bases, no w/r/r, normal WOB on 12L HFNC GI: BS+, ND, soft, mild global TTP, no masses noted, no organomegaly noted MSK: No e/c/c Skin: No rashes, bruises, ulcerations noted Neuro: A&O x 3, no focal deficits Psyc: Appropriate interaction and affect, calm/cooperative  Labs on Admission: I have personally reviewed following labs and imaging studies  CBC: Recent Labs  Lab 03-28-20 0830  WBC 6.3  NEUTROABS 5.9  HGB 15.7  HCT 45.2  MCV 83.7  PLT 259   Basic Metabolic Panel: Recent Labs  Lab 03/28/20 0830  NA 125*  K 4.3  CL 84*  CO2 21*  GLUCOSE 335*  BUN 13  CREATININE 1.22  CALCIUM 8.8*   GFR: Estimated Creatinine Clearance: 68.2 mL/min (by C-G formula based on SCr of 1.22 mg/dL). Liver Function Tests: Recent Labs  Lab Mar 13, 2020 0830  AST 76*  ALT 66*  ALKPHOS 84  BILITOT 1.3*  PROT 7.7  ALBUMIN 3.8   No results for input(s): LIPASE, AMYLASE in the last 168 hours. No results for input(s): AMMONIA in the last 168 hours. Coagulation Profile: No results for input(s): INR, PROTIME in the last 168 hours. Cardiac Enzymes: No results for input(s): CKTOTAL, CKMB, CKMBINDEX, TROPONINI in the last 168 hours. BNP (last 3 results) No results for input(s): PROBNP in the last 8760 hours. HbA1C: No results for input(s): HGBA1C in the  last 72 hours. CBG: No results for input(s): GLUCAP in the last 168 hours. Lipid Profile: Recent Labs    March 13, 2020 0830  TRIG 137   Thyroid Function Tests: No results for input(s): TSH, T4TOTAL, FREET4, T3FREE, THYROIDAB in the last 72 hours. Anemia Panel: Recent Labs    03/13/2020 0830  FERRITIN 915*   Urine analysis: No results found for: COLORURINE, APPEARANCEUR, LABSPEC, PHURINE, GLUCOSEU, HGBUR, BILIRUBINUR, KETONESUR, PROTEINUR, UROBILINOGEN, NITRITE, LEUKOCYTESUR  Radiological Exams on Admission: DG Chest 2 View  Result Date: 03/13/20 CLINICAL DATA:  49 year old male with chest pain and shortness of breath for 5 days. Nausea vomiting. Positive for COVID-19 yesterday. EXAM: CHEST - 2 VIEW COMPARISON:  Portable chest 03/08/2020. FINDINGS: Widespread patchy but indistinct bilateral mid and lower lung opacity. On the lateral view middle and lower lobe involvement is suggested. Left lung opacity does appear increased since yesterday. No superimposed pneumothorax or pleural effusion. Mediastinal contours remain within normal limits. Visualized tracheal air column is within normal limits. No osseous abnormality identified. Negative visible bowel gas pattern. IMPRESSION: Bilateral COVID-19 pneumonia with mild progression since yesterday. No pleural effusion. Electronically Signed   By: Odessa Fleming M.D.   On: Mar 13, 2020 07:33   DG Chest Portable 1 View  Result Date: 03/08/2020 CLINICAL DATA:  COVID, shortness of breath. EXAM: PORTABLE CHEST 1 VIEW COMPARISON:  None. FINDINGS: Bibasilar peripheral predominant airspace opacities. No visible pleural effusions or pneumothorax. Cardiac silhouette is accentuated by AP technique and low lung volumes. No acute osseous abnormality. IMPRESSION: Bibasilar peripheral predominant airspace opacities, compatible with multifocal pneumonia and reported COVID diagnosis. Electronically Signed   By: Feliberto Harts MD   On: 03/08/2020 08:42    EKG:  Independently reviewed. Sinus tach, no st elevation  Assessment/Plan COVID 19 PNA Possible superimposed bacterial PNA     - admit to inpt, progress     - steroids, remdes, inhalers, anti-tussives, IS, FV     - currently on 12 HFNC, wean O2 as able     - follow inflammatory markers     - procal is also elevated, cover with rocephin, zithro  Hyperglycemia w/ elevated anion gap     - no Hx of DM     - give lantus OT dose now, check A1c and beta-hydroxybutyric acid     - lactic acid is normal     - will also get some fluids  Hyponatremia     - may be related to hyperglycemia     - check Uosm, UNa+     - fluids, follow  Elevated LFTs     - check hepatitis panel  DVT prophylaxis: lovenox  Code Status: FULL  Family Communication: None at bedside  Consults called: None   Status is: Inpatient  Remains inpatient appropriate because:Inpatient level of care  appropriate due to severity of illness   Dispo: The patient is from: Home              Anticipated d/c is to: Home              Anticipated d/c date is: 3 days              Patient currently is not medically stable to d/c.  Teddy Spike DO [facility]  If 7PM-7AM, please contact night-coverage [email or web address]  03-12-2020, 10:59 AM

## 2020-03-10 ENCOUNTER — Inpatient Hospital Stay (HOSPITAL_COMMUNITY): Payer: Medicaid Other

## 2020-03-10 DIAGNOSIS — U071 COVID-19: Secondary | ICD-10-CM

## 2020-03-10 DIAGNOSIS — J1282 Pneumonia due to coronavirus disease 2019: Secondary | ICD-10-CM

## 2020-03-10 LAB — CBC WITH DIFFERENTIAL/PLATELET
Abs Immature Granulocytes: 0.01 10*3/uL (ref 0.00–0.07)
Basophils Absolute: 0 10*3/uL (ref 0.0–0.1)
Basophils Relative: 0 %
Eosinophils Absolute: 0 10*3/uL (ref 0.0–0.5)
Eosinophils Relative: 0 %
HCT: 40.2 % (ref 39.0–52.0)
Hemoglobin: 14.1 g/dL (ref 13.0–17.0)
Immature Granulocytes: 0 %
Lymphocytes Relative: 6 %
Lymphs Abs: 0.4 10*3/uL — ABNORMAL LOW (ref 0.7–4.0)
MCH: 29.2 pg (ref 26.0–34.0)
MCHC: 35.1 g/dL (ref 30.0–36.0)
MCV: 83.2 fL (ref 80.0–100.0)
Monocytes Absolute: 0.1 10*3/uL (ref 0.1–1.0)
Monocytes Relative: 1 %
Neutro Abs: 5.9 10*3/uL (ref 1.7–7.7)
Neutrophils Relative %: 93 %
Platelets: 302 10*3/uL (ref 150–400)
RBC: 4.83 MIL/uL (ref 4.22–5.81)
RDW: 12.1 % (ref 11.5–15.5)
WBC Morphology: INCREASED
WBC: 6.4 10*3/uL (ref 4.0–10.5)
nRBC: 0 % (ref 0.0–0.2)

## 2020-03-10 LAB — COMPREHENSIVE METABOLIC PANEL
ALT: 76 U/L — ABNORMAL HIGH (ref 0–44)
AST: 117 U/L — ABNORMAL HIGH (ref 15–41)
Albumin: 3 g/dL — ABNORMAL LOW (ref 3.5–5.0)
Alkaline Phosphatase: 92 U/L (ref 38–126)
Anion gap: 10 (ref 5–15)
BUN: 16 mg/dL (ref 6–20)
CO2: 25 mmol/L (ref 22–32)
Calcium: 8 mg/dL — ABNORMAL LOW (ref 8.9–10.3)
Chloride: 97 mmol/L — ABNORMAL LOW (ref 98–111)
Creatinine, Ser: 0.93 mg/dL (ref 0.61–1.24)
GFR, Estimated: >60 mL/min (ref >60)
Glucose, Bld: 188 mg/dL — ABNORMAL HIGH (ref 70–99)
Potassium: 3.8 mmol/L (ref 3.5–5.1)
Sodium: 132 mmol/L — ABNORMAL LOW (ref 135–145)
Total Bilirubin: 0.7 mg/dL (ref 0.3–1.2)
Total Protein: 6.4 g/dL — ABNORMAL LOW (ref 6.5–8.1)

## 2020-03-10 LAB — GLUCOSE, CAPILLARY
Glucose-Capillary: 163 mg/dL — ABNORMAL HIGH (ref 70–99)
Glucose-Capillary: 177 mg/dL — ABNORMAL HIGH (ref 70–99)
Glucose-Capillary: 178 mg/dL — ABNORMAL HIGH (ref 70–99)
Glucose-Capillary: 180 mg/dL — ABNORMAL HIGH (ref 70–99)
Glucose-Capillary: 191 mg/dL — ABNORMAL HIGH (ref 70–99)
Glucose-Capillary: 192 mg/dL — ABNORMAL HIGH (ref 70–99)
Glucose-Capillary: 197 mg/dL — ABNORMAL HIGH (ref 70–99)
Glucose-Capillary: 297 mg/dL — ABNORMAL HIGH (ref 70–99)
Glucose-Capillary: 297 mg/dL — ABNORMAL HIGH (ref 70–99)
Glucose-Capillary: 306 mg/dL — ABNORMAL HIGH (ref 70–99)

## 2020-03-10 LAB — BLOOD GAS, ARTERIAL
Acid-base deficit: 4.6 mmol/L — ABNORMAL HIGH (ref 0.0–2.0)
Bicarbonate: 24.9 mmol/L (ref 20.0–28.0)
FIO2: 100
MECHVT: 350 mL
O2 Content: 100 L/min
O2 Saturation: 72 %
PEEP: 16 cmH2O
Patient temperature: 98.6
pCO2 arterial: 66.3 mmHg (ref 32.0–48.0)
pH, Arterial: 7.2 — ABNORMAL LOW (ref 7.350–7.450)
pO2, Arterial: 47.5 mmHg — ABNORMAL LOW (ref 83.0–108.0)

## 2020-03-10 LAB — HIV ANTIBODY (ROUTINE TESTING W REFLEX): HIV Screen 4th Generation wRfx: NONREACTIVE

## 2020-03-10 LAB — C-REACTIVE PROTEIN: CRP: 31.9 mg/dL — ABNORMAL HIGH (ref <1.0)

## 2020-03-10 LAB — D-DIMER, QUANTITATIVE: D-Dimer, Quant: 3.14 ug/mL-FEU — ABNORMAL HIGH (ref 0.00–0.50)

## 2020-03-10 LAB — MAGNESIUM
Magnesium: 2.8 mg/dL — ABNORMAL HIGH (ref 1.7–2.4)
Magnesium: 2.8 mg/dL — ABNORMAL HIGH (ref 1.7–2.4)

## 2020-03-10 LAB — FERRITIN: Ferritin: 1095 ng/mL — ABNORMAL HIGH (ref 24–336)

## 2020-03-10 LAB — PHOSPHORUS: Phosphorus: 4.2 mg/dL (ref 2.5–4.6)

## 2020-03-10 MED ORDER — INSULIN GLARGINE 100 UNIT/ML ~~LOC~~ SOLN: 10.0000 [IU] | Freq: Every day | SUBCUTANEOUS | Status: DC

## 2020-03-10 MED ORDER — SUCCINYLCHOLINE CHLORIDE 200 MG/10ML IV SOSY
PREFILLED_SYRINGE | INTRAVENOUS | Status: AC
  Filled 2020-03-10: qty 10

## 2020-03-10 MED ORDER — SODIUM CHLORIDE 0.9 % IV SOLN
500.0000 mg | INTRAVENOUS | Status: AC
  Administered 2020-03-10 – 2020-03-13 (×4): 500 mg via INTRAVENOUS
  Filled 2020-03-10 (×4): qty 500

## 2020-03-10 MED ORDER — ETOMIDATE 2 MG/ML IV SOLN
INTRAVENOUS | Status: AC
  Administered 2020-03-10: 15:00:00 30 mg
  Filled 2020-03-10: qty 20

## 2020-03-10 MED ORDER — LORAZEPAM 2 MG/ML IJ SOLN
INTRAMUSCULAR | Status: AC
  Filled 2020-03-10: qty 1

## 2020-03-10 MED ORDER — FENTANYL BOLUS VIA INFUSION
50.0000 ug | INTRAVENOUS | Status: DC | PRN
  Administered 2020-03-10 – 2020-03-13 (×2): 50 ug via INTRAVENOUS
  Filled 2020-03-10: qty 50

## 2020-03-10 MED ORDER — INSULIN GLARGINE 100 UNIT/ML ~~LOC~~ SOLN
10.0000 [IU] | Freq: Every day | SUBCUTANEOUS | Status: DC
  Administered 2020-03-10: 05:00:00 10 [IU] via SUBCUTANEOUS
  Filled 2020-03-10 (×2): qty 0.1

## 2020-03-10 MED ORDER — FENTANYL CITRATE (PF) 100 MCG/2ML IJ SOLN: 100.0000 ug | Freq: Once | INTRAMUSCULAR | Status: AC

## 2020-03-10 MED ORDER — FENTANYL 2500MCG IN NS 250ML (10MCG/ML) PREMIX INFUSION
50.0000 ug/h | INTRAVENOUS | Status: DC
  Administered 2020-03-10: 15:00:00 50 ug/h via INTRAVENOUS
  Administered 2020-03-11 (×2): 125 ug/h via INTRAVENOUS
  Administered 2020-03-12: 150 ug/h via INTRAVENOUS
  Administered 2020-03-13: 200 ug/h via INTRAVENOUS
  Administered 2020-03-13: 150 ug/h via INTRAVENOUS
  Filled 2020-03-10 (×5): qty 250

## 2020-03-10 MED ORDER — VECURONIUM BROMIDE 10 MG IV SOLR
0.0000 ug/kg/min | Status: DC
  Administered 2020-03-10 – 2020-03-11 (×2): 1 ug/kg/min via INTRAVENOUS
  Administered 2020-03-11: 10:00:00 0.994 ug/kg/min via INTRAVENOUS
  Administered 2020-03-12: 1 ug/kg/min via INTRAVENOUS
  Filled 2020-03-10 (×5): qty 100

## 2020-03-10 MED ORDER — FUROSEMIDE 10 MG/ML IJ SOLN
40.0000 mg | Freq: Once | INTRAMUSCULAR | Status: AC
  Administered 2020-03-10: 17:00:00 40 mg via INTRAVENOUS
  Filled 2020-03-10: qty 4

## 2020-03-10 MED ORDER — VECURONIUM BOLUS VIA INFUSION
0.0800 mg/kg | Freq: Once | INTRAVENOUS | Status: AC
  Administered 2020-03-10: 20:00:00 5.8 mg via INTRAVENOUS
  Filled 2020-03-10: qty 6

## 2020-03-10 MED ORDER — LORAZEPAM 2 MG/ML IJ SOLN
1.0000 mg | Freq: Once | INTRAMUSCULAR | Status: AC
  Administered 2020-03-10: 14:00:00 1 mg via INTRAVENOUS

## 2020-03-10 MED ORDER — PROPOFOL 1000 MG/100ML IV EMUL
0.0000 ug/kg/min | INTRAVENOUS | Status: DC
  Administered 2020-03-10: 22:00:00 30 ug/kg/min via INTRAVENOUS
  Administered 2020-03-10: 15:00:00 5 ug/kg/min via INTRAVENOUS
  Administered 2020-03-11 – 2020-03-12 (×4): 30 ug/kg/min via INTRAVENOUS
  Administered 2020-03-12: 40 ug/kg/min via INTRAVENOUS
  Administered 2020-03-12 (×2): 30 ug/kg/min via INTRAVENOUS
  Administered 2020-03-13: 35 ug/kg/min via INTRAVENOUS
  Filled 2020-03-10 (×6): qty 100
  Filled 2020-03-10 (×2): qty 200
  Filled 2020-03-10: qty 100

## 2020-03-10 MED ORDER — ROCURONIUM BROMIDE 50 MG/5ML IV SOLN: 30.0000 mg | Freq: Once | INTRAVENOUS | Status: AC

## 2020-03-10 MED ORDER — VITAL HIGH PROTEIN PO LIQD
1000.0000 mL | ORAL | Status: DC
  Administered 2020-03-11: 05:00:00 1000 mL

## 2020-03-10 MED ORDER — FENTANYL CITRATE (PF) 100 MCG/2ML IJ SOLN
INTRAMUSCULAR | Status: AC
  Administered 2020-03-10: 15:00:00 100 ug
  Filled 2020-03-10: qty 2

## 2020-03-10 MED ORDER — MIDAZOLAM HCL 2 MG/2ML IJ SOLN: 4.0000 mg | Freq: Once | INTRAMUSCULAR | Status: AC

## 2020-03-10 MED ORDER — ORAL CARE MOUTH RINSE
15.0000 mL | OROMUCOSAL | Status: DC
  Administered 2020-03-11 – 2020-03-14 (×31): 15 mL via OROMUCOSAL

## 2020-03-10 MED ORDER — PANTOPRAZOLE SODIUM 40 MG IV SOLR
40.0000 mg | Freq: Every day | INTRAVENOUS | Status: DC
  Administered 2020-03-10 – 2020-03-16 (×7): 40 mg via INTRAVENOUS
  Filled 2020-03-10 (×7): qty 40

## 2020-03-10 MED ORDER — VECURONIUM BROMIDE 10 MG IV SOLR
0.1000 mg/kg | INTRAVENOUS | Status: DC | PRN
  Filled 2020-03-10: qty 10

## 2020-03-10 MED ORDER — ETOMIDATE 2 MG/ML IV SOLN: 20.0000 mg | Freq: Once | INTRAVENOUS | Status: AC

## 2020-03-10 MED ORDER — INSULIN STARTER KIT- PEN NEEDLES (ENGLISH)
1.0000 | Freq: Once | Status: DC
  Filled 2020-03-10: qty 1

## 2020-03-10 MED ORDER — VECURONIUM BROMIDE 10 MG IV SOLR
INTRAVENOUS | Status: AC
  Administered 2020-03-10: 15:00:00 10 mg
  Filled 2020-03-10: qty 10

## 2020-03-10 MED ORDER — PHENYLEPHRINE 40 MCG/ML (10ML) SYRINGE FOR IV PUSH (FOR BLOOD PRESSURE SUPPORT)
PREFILLED_SYRINGE | INTRAVENOUS | Status: AC
  Filled 2020-03-10: qty 10

## 2020-03-10 MED ORDER — STERILE WATER FOR INJECTION IJ SOLN
INTRAMUSCULAR | Status: AC
  Filled 2020-03-10: qty 10

## 2020-03-10 MED ORDER — ARTIFICIAL TEARS OPHTHALMIC OINT
1.0000 "application " | TOPICAL_OINTMENT | Freq: Three times a day (TID) | OPHTHALMIC | Status: DC
  Administered 2020-03-10 – 2020-03-13 (×8): 1 via OPHTHALMIC
  Filled 2020-03-10 (×3): qty 3.5

## 2020-03-10 MED ORDER — FENTANYL CITRATE (PF) 100 MCG/2ML IJ SOLN
50.0000 ug | Freq: Once | INTRAMUSCULAR | Status: AC
Start: 2020-03-10 — End: 2020-03-10
  Administered 2020-03-10: 16:00:00 100 ug via INTRAVENOUS

## 2020-03-10 MED ORDER — INSULIN ASPART 100 UNIT/ML ~~LOC~~ SOLN
0.0000 [IU] | Freq: Three times a day (TID) | SUBCUTANEOUS | Status: DC
  Administered 2020-03-10: 17:00:00 8 [IU] via SUBCUTANEOUS
  Administered 2020-03-10: 13:00:00 11 [IU] via SUBCUTANEOUS
  Administered 2020-03-10: 07:00:00 3 [IU] via SUBCUTANEOUS

## 2020-03-10 MED ORDER — INSULIN ASPART 100 UNIT/ML ~~LOC~~ SOLN: 0.0000 [IU] | Freq: Every day | SUBCUTANEOUS | Status: DC

## 2020-03-10 MED ORDER — LIVING WELL WITH DIABETES BOOK
Freq: Once | Status: DC
  Filled 2020-03-10: qty 1

## 2020-03-10 MED ORDER — MIDAZOLAM HCL 2 MG/2ML IJ SOLN
INTRAMUSCULAR | Status: AC
  Administered 2020-03-10: 15:00:00 4 mg
  Filled 2020-03-10: qty 4

## 2020-03-10 MED ORDER — SODIUM CHLORIDE 0.9 % IV SOLN: INTRAVENOUS | Status: DC | PRN

## 2020-03-10 MED ORDER — VECURONIUM BROMIDE 10 MG IV SOLR: 10.0000 mg | Freq: Once | INTRAVENOUS | Status: AC

## 2020-03-10 MED ORDER — INSULIN ASPART 100 UNIT/ML ~~LOC~~ SOLN
0.0000 [IU] | SUBCUTANEOUS | Status: DC
  Administered 2020-03-10 – 2020-03-11 (×5): 8 [IU] via SUBCUTANEOUS
  Administered 2020-03-11: 11 [IU] via SUBCUTANEOUS
  Administered 2020-03-11 – 2020-03-12 (×2): 8 [IU] via SUBCUTANEOUS

## 2020-03-10 MED ORDER — PROSOURCE TF PO LIQD
45.0000 mL | Freq: Two times a day (BID) | ORAL | Status: DC
  Administered 2020-03-10 – 2020-03-11 (×2): 45 mL
  Filled 2020-03-10 (×2): qty 45

## 2020-03-10 MED ORDER — SODIUM CHLORIDE 0.9 % IV SOLN
1.0000 g | INTRAVENOUS | Status: AC
  Administered 2020-03-10 – 2020-03-13 (×4): 1 g via INTRAVENOUS
  Filled 2020-03-10: qty 1
  Filled 2020-03-10: qty 10
  Filled 2020-03-10 (×2): qty 1

## 2020-03-10 MED ORDER — ROCURONIUM BROMIDE 10 MG/ML (PF) SYRINGE
PREFILLED_SYRINGE | INTRAVENOUS | Status: AC
  Administered 2020-03-10: 15:00:00 30 mg
  Filled 2020-03-10: qty 10

## 2020-03-10 MED ORDER — ARTIFICIAL TEARS OPHTHALMIC OINT
1.0000 "application " | TOPICAL_OINTMENT | Freq: Three times a day (TID) | OPHTHALMIC | Status: DC
  Administered 2020-03-10: 1 via OPHTHALMIC
  Filled 2020-03-10: qty 3.5

## 2020-03-10 MED ORDER — CHLORHEXIDINE GLUCONATE 0.12% ORAL RINSE (MEDLINE KIT)
15.0000 mL | Freq: Two times a day (BID) | OROMUCOSAL | Status: DC
  Administered 2020-03-11 – 2020-03-14 (×8): 15 mL via OROMUCOSAL

## 2020-03-10 NOTE — Procedures (Signed)
Central Venous Catheter Insertion Procedure Note  Alex [name]  [id number]  05/19/1970  Date:03/10/20  Time:3:40 PM   Provider Performing:Alex [name]   Procedure: Insertion of Non-tunneled Central Venous Catheter(36556) with US guidance (02233)   Indication(s) Medication administration  Consent Unable to obtain consent due to emergent nature of procedure.  Anesthesia Topical only with 1% lidocaine   Timeout Verified patient identification, verified procedure, site/side was marked, verified correct patient position, special equipment/implants available, medications/allergies/relevant history reviewed, required imaging and test results available.  Sterile Technique Maximal sterile technique including full sterile barrier drape, hand hygiene, sterile gown, sterile gloves, mask, hair covering, sterile ultrasound probe cover (if used).  Procedure Description Area of catheter insertion was cleaned with chlorhexidine and draped in sterile fashion.  With real-time ultrasound guidance a central venous catheter was placed into the right internal jugular vein. Nonpulsatile blood flow and easy flushing noted in all ports.  The catheter was sutured in place and sterile dressing applied.  Complications/Tolerance None; patient tolerated the procedure well. Chest X-ray is ordered to verify placement for internal jugular or subclavian cannulation.   Chest x-ray is not ordered for femoral cannulation.  EBL Minimal  Specimen(s) None

## 2020-03-10 NOTE — Progress Notes (Signed)
eLink Physician-Brief Progress Note Patient Name: Alex [name] DOB: Jan 01, 1971 MRN: [id number]   Date of Service  03/10/2020  HPI/Events of Note  Hyperglycemia - Request to change from AC/HS moderate Novolog SSI to Q 4 moderate Novolog SSI as patient is now intubated.   eICU Interventions  Plan: 1. Change to Q 4 hour moderate Novolog SSI.     Intervention Category Major Interventions: Hyperglycemia - active titration of insulin therapy  Lenell Antu 03/10/2020, 9:13 PM

## 2020-03-10 NOTE — TOC Initial Note (Signed)
Transition of Care [facility]) - Initial/Assessment Note    Patient Details  Name: Alex [name] MRN: [id number] Date of Birth: 1970/12/03  [facility] CM/SW Contact:    Golda Acre, RN Phone Number: 03/10/2020, 7:45 AM  Clinical Narrative:                 49 y.o. male with no significant medical history significant. Presenting with dyspnea, cough, N/V. He reports that his symptoms began with cough 1 week ago. He tried some OTC meds but nothing help. Symptoms worsened to include body aches, fatigue, N/V 3 days later. Continued with OTC meds and fluids, but nothing seemed to help. He went to his PCP yesterday and got tested for COVID. He was positive. He went home with supportive care. This morning he woke up unable to breathe. He became concerned and came to the ED.   ED Course: Found he was COVID positive. Started on steroids. TRH was called for admission.   Review of Systems: Review of systems is otherwise negative for all not mentioned in HPI.   PMHx History reviewed. No pertinent past medical history.  PSHx      Past Surgical History:  Procedure Laterality Date  . Wisdon teeth      SocHx  reports that he has never smoked. He has never used smokeless tobacco. He reports current alcohol use. He reports that he does not use drugs.  PLAN: TO RETURN TO HOME FOLLOWING FOR PROIGRESSION  Expected Discharge Plan: Home/Self Care Barriers to Discharge: Continued Medical Work up   Patient Goals and CMS Choice Patient states their goals for this hospitalization and ongoing recovery are:: to go home CMS Medicare.gov Compare Post Acute Care list provided to:: Patient Choice offered to / list presented to : Patient  Expected Discharge Plan and Services Expected Discharge Plan: Home/Self Care   Discharge Planning Services: CM Consult   Living arrangements for the past 2 months: Single Family Home                                      Prior Living  Arrangements/Services Living arrangements for the past 2 months: Single Family Home Lives with:: Self Patient language and need for interpreter reviewed:: Yes Do you feel safe going back to the place where you live?: Yes      Need for Family Participation in Patient Care: Yes (Comment) Care giver support system in place?: Yes (comment)   Criminal Activity/Legal Involvement Pertinent to Current Situation/Hospitalization: No - Comment as needed  Activities of Daily Living Home Assistive Devices/Equipment: None ADL Screening (condition at time of admission) Patient's cognitive ability adequate to safely complete daily activities?: Yes Is the patient deaf or have difficulty hearing?: No Does the patient have difficulty seeing, even when wearing glasses/contacts?: No Does the patient have difficulty concentrating, remembering, or making decisions?: No Patient able to express need for assistance with ADLs?: Yes Does the patient have difficulty dressing or bathing?: No Independently performs ADLs?: Yes (appropriate for developmental age) Does the patient have difficulty walking or climbing stairs?: No Weakness of Legs: None Weakness of Arms/Hands: None  Permission Sought/Granted                  Emotional Assessment Appearance:: Appears stated age Attitude/Demeanor/Rapport: Engaged Affect (typically observed): Calm Orientation: : Oriented to Place,Oriented to Self,Oriented to  Time,Oriented to Situation Alcohol / Substance Use: Tobacco Use  Psych Involvement: No (comment)  Admission diagnosis:  Acute respiratory failure with hypoxia (HCC) [J96.01] Pneumonia due to COVID-19 virus [U07.1, J12.82] COVID-19 [U07.1] Patient Active Problem List   Diagnosis Date Noted  . COVID-19 03/12/2020  . Pneumonia due to COVID-19 virus 02/15/2020   PCP:  Patient, No Pcp Per Pharmacy:   [id number] [place], [place] - [phone] AT [facility] [address] [place] [address] Phone: [phone] Fax: [phone]     Social Determinants of Health (SDOH) Interventions    Readmission Risk Interventions No flowsheet data found.

## 2020-03-10 NOTE — Procedures (Signed)
Arterial Catheter Insertion Procedure Note  Alex [name]  [id number]  08/12/1970  Date:03/10/20  Time:3:20 PM    Provider Performing: Suzan Garibaldi    Procedure: Insertion of Arterial Line (78295) without US guidance  Indication(s) Blood pressure monitoring and/or need for frequent ABGs  Consent Unable to obtain consent due to emergent nature of procedure.  Anesthesia None   Time Out Verified patient identification, verified procedure, site/side was marked, verified correct patient position, special equipment/implants available, medications/allergies/relevant history reviewed, required imaging and test results available.   Sterile Technique Maximal sterile technique including full sterile barrier drape, hand hygiene, sterile gown, sterile gloves, mask, hair covering, sterile ultrasound probe cover (if used).   Procedure Description Area of catheter insertion was cleaned with chlorhexidine and draped in sterile fashion. With real-time ultrasound guidance an arterial catheter was placed into the left radial artery.  Appropriate arterial tracings confirmed on monitor.     Complications/Tolerance None; patient tolerated the procedure well.   EBL Minimal   Specimen(s) None

## 2020-03-10 NOTE — Procedures (Signed)
Intubation Procedure Note  Alex [name]  [id number]  [birth year]  Date:03/10/20  Time:3:39 PM   Provider Performing:Michaelle Bottomley A Uthman Mroczkowski    Procedure: Intubation (31500)  Indication(s) Respiratory Failure  Consent Unable to obtain consent due to emergent nature of procedure.   Anesthesia Etomidate, Versed, Fentanyl and Rocuronium  20 mg etomidate, 3 mg Versed, 100 mcg fentanyl, 30 mg rocuronium  Time Out Verified patient identification, verified procedure, site/side was marked, verified correct patient position, special equipment/implants available, medications/allergies/relevant history reviewed, required imaging and test results available.   Sterile Technique Usual hand hygeine, masks, and gloves were used   Procedure Description Patient positioned in bed supine.  Sedation given as noted above.  Patient was intubated with endotracheal tube using Glidescope.  View was Grade 1 full glottis .  Number of attempts was 1.  Colorimetric CO2 detector was consistent with tracheal placement.   Complications/Tolerance None; patient tolerated the procedure well. Chest X-ray is ordered to verify placement.   EBL None   Specimen(s) None

## 2020-03-10 NOTE — Progress Notes (Signed)
Pt was given 2 boluses of 100 Fent and 1 bolus of Propofol from pump. Immediately after intubation, per Wynona Neat, MD, verbal order at bedside. Pt was not compliant with vent and MD putting in lines.

## 2020-03-10 NOTE — Progress Notes (Addendum)
Patient placed in prone position  Still hypoxemic with saturations in the low 70s  PEEP increased to 18 Respiratory rate increased to 30  Diuresed with 40 of Lasix Monitor CVP  Chest x-ray reviewed  Initiate tube feeding   Updated patient's son and patient's listed contact  Patient's son [phone]

## 2020-03-10 NOTE — Plan of Care (Signed)
  Problem: Coping: Goal: Psychosocial and spiritual needs will be supported Outcome: Not Progressing   Problem: Respiratory: Goal: Will maintain a patent airway Outcome: Not Progressing   Problem: Education: Goal: Knowledge of General Education information will improve Description: Including pain rating scale, medication(s)/side effects and non-pharmacologic comfort measures Outcome: Progressing   Problem: Education: Goal: Knowledge of risk factors and measures for prevention of condition will improve Outcome: Progressing   Problem: Respiratory: Goal: Complications related to the disease process, condition or treatment will be avoided or minimized Outcome: Progressing

## 2020-03-10 NOTE — Progress Notes (Signed)
Inpatient Diabetes Program Recommendations  AACE/ADA: New Consensus Statement on Inpatient Glycemic Control (2015)  Target Ranges:  Prepandial:   less than 140 mg/dL      Peak postprandial:   less than 180 mg/dL (1-2 hours)      Critically ill patients:  140 - 180 mg/dL   Lab Results  Component Value Date   GLUCAP 191 (H) 03/10/2020   HGBA1C 11.8 (H) 02/29/2020    Review of Glycemic Control  Diabetes history: DM2 Outpatient Diabetes medications: None Current orders for Inpatient glycemic control: IV insulin transitioning to Lantus 10 units QD and Novolog 0-15 units tidwc  On Solumedrol 40 mg Q12H HgbA1C - 11.8%  Inpatient Diabetes Program Recommendations:     Add Novolog HS correction If FBS > 180 mg/dL, increase Lantus to 15 units QD.  Will order Living Well book and insulin pen starter kit, as pt will likely need insulin at home.   Will speak with pt this afternoon regarding his diabetes and HgbA1C of 11.8%.  Allow pt to stick finger for CBG checks and give his own insulin injections. Survival skill teaching.  Continue to follow.  Thank you. Lorenda Peck, RD, LDN, CDE Inpatient Diabetes Coordinator [phone]

## 2020-03-10 NOTE — Progress Notes (Addendum)
ABG from 1601 noted  Recruitment maneuvers did not help saturations  Patient switched to pressure control-did not help desaturations as well   Will try to increase rate to 35, continuous paralysis to see if this helps  Unfortunately continues to do poorly Remains unable to oxygenate well despite full ventilator support  Prognosis is poor

## 2020-03-10 NOTE — Progress Notes (Signed)
Pt was anxious and sob of breath got progressively worse. RT was called to placed pt on HHFNC. Pt was still sob and labored breathing. RT was called back to check on pt. Pt was placed on BiPAP. Pt was still SOB, and tachypnea. No relief from BiPAP, Sats in the 70s. IV Ativan was given to support pt breathing and help calm pt, after stating he was anxious. Sats stayed in the 70s. MD was present and decided to intubate pt. Procedure was explained to pt, and pt agreeable.

## 2020-03-10 NOTE — Consult Note (Signed)
NAME:  Alex [name], MRN:  [id number], DOB:  [birth year], LOS: 1 ADMISSION DATE:  02/12/2020, CONSULTATION DATE:  03/10/2020  REFERRING MD:  Dr Natale Milch, CHIEF COMPLAINT:  Acute respiratory failure   Brief History:  Patient admitted with Covid pneumonia Continues to deteriorate  History of Present Illness:  No significant medical history Had a cough, body aches fatigue nausea and vomiting Got tested for Covid on the 27 Severe shortness of breath 40 woke up on a 28 led to presentation to the hospital  Past Medical History:  History reviewed. No pertinent past medical history.  Significant Hospital Events:  Endotracheal intubation 12/29  Consults:  PCCM 12/29  Procedures:  Endotracheal intubation 12/29  Significant Diagnostic Tests:  Chest x-ray reviewed by myself showing multifocal infiltrates  Micro Data:  Coronavirus positive Blood culture 12/28>> Antimicrobials:  Azithromycin 12/29>> Ceftriaxone 12/29>> Remdesivir 12/29>>  Interim History / Subjective:  Worsening saturations today, significant anxiety Increasing oxygen requirement Persistently low oxygen saturation led to consultation to critical care medicine  Objective   Blood pressure (!) 160/93, pulse (!) 118, temperature 98.6 F (37 C), temperature source Axillary, resp. rate (!) 51, height 5\' 4"  (1.626 m), weight 72.1 kg, SpO2 (!) 75 %.    FiO2 (%):  [100 %] 100 %   Intake/Output Summary (Last 24 hours) at 03/10/2020 1359 Last data filed at 03/10/2020 1308 Gross per 24 hour  Intake 1261.22 ml  Output 475 ml  Net 786.22 ml   Filed Weights   03/12/2020 0556  Weight: 72.1 kg   Examination: General: Middle-age gentleman, increased work of breathing HENT: Dry oral mucosa Lungs: Decreased air movement bilaterally Cardiovascular: S1-S2 appreciated Abdomen: Soft, bowel sounds appreciated Extremities: No clubbing, no edema Neuro: Alert and oriented GU: Fair output  Resolved Hospital Problem list      Assessment & Plan:  Acute hypoxic respiratory failure secondary to Covid pneumonia Possible superimposed concurrent bacterial pneumonia -On steroids, remdesivir -Follow inflammatory markers -Risk of progression is significant  Hypoxemic respiratory failure -Continue mechanical ventilation per ARDS protocol Target TVol 6-8cc/kgIBW Target Plateau Pressure < 30cm H20 Target driving pressure less than 15 cm of water Target PaO2 55-65: titrate PEEP/FiO2 per protocol As long as PaO2 to FiO2 ratio is less than 1:150 position in prone position for 16 hours a day Check CVP daily if CVL in place Target CVP less than 4, diurese as necessary Ventilator associated pneumonia prevention protocol  Hyperglycemia -SSI  Transaminitis -Monitor closely  We will initiate proning  Best practice (evaluated daily)  Diet: Tube feeds Pain/Anxiety/Delirium protocol (if indicated): Propofol, fentanyl, intermittent paralytic VAP protocol (if indicated): In place DVT prophylaxis: Lovenox GI prophylaxis: Protonix Glucose control: SSI Mobility: Bedrest Disposition: ICU  Goals of Care:  Last date of multidisciplinary goals of care discussion: Pending Family and staff present: No Summary of discussion: No Follow up goals of care discussion due: We will update Code Status: Full code  Labs   CBC: Recent Labs  Lab 02/26/2020 0830 03/10/20 0222  WBC 6.3 6.4  NEUTROABS 5.9 5.9  HGB 15.7 14.1  HCT 45.2 40.2  MCV 83.7 83.2  PLT 259 302    Basic Metabolic Panel: Recent Labs  Lab 02/14/2020 0830 02/22/2020 1810 03/02/2020 2254 03/10/20 0222  NA 125* 128* 128* 132*  K 4.3 3.9 4.1 3.8  CL 84* 91* 94* 97*  CO2 21* 20* 22 25  GLUCOSE 335* 365* 209* 188*  BUN 13 15 16 16   CREATININE 1.22 1.15 0.95 0.93  CALCIUM 8.8*  8.1* 7.9* 8.0*  MG  --   --   --  2.8*   GFR: Estimated Creatinine Clearance: 89.4 mL/min (by C-G formula based on SCr of 0.93 mg/dL). Recent Labs  Lab 03-13-20 0830  03/10/20 0222  PROCALCITON 18.38  --   WBC 6.3 6.4  LATICACIDVEN 1.4  --     Liver Function Tests: Recent Labs  Lab March 13, 2020 0830 03/13/20 1810 03/10/20 0222  AST 76* 96* 117*  ALT 66* 70* 76*  ALKPHOS 84 94 92  BILITOT 1.3* 1.3* 0.7  PROT 7.7 7.5 6.4*  ALBUMIN 3.8 3.4* 3.0*   No results for input(s): LIPASE, AMYLASE in the last 168 hours. No results for input(s): AMMONIA in the last 168 hours.  ABG    Component Value Date/Time   HCO3 21.2 03-13-20 1810   ACIDBASEDEF 4.2 (H) 03-13-20 1810   O2SAT 67.3 03/13/2020 1810     Coagulation Profile: No results for input(s): INR, PROTIME in the last 168 hours.  Cardiac Enzymes: No results for input(s): CKTOTAL, CKMB, CKMBINDEX, TROPONINI in the last 168 hours.  HbA1C: Hgb A1c MFr Bld  Date/Time Value Ref Range Status  Mar 13, 2020 06:10 PM 11.8 (H) 4.8 - 5.6 % Final    Comment:    (NOTE) Pre diabetes:          5.7%-6.4%  Diabetes:              >6.4%  Glycemic control for   <7.0% adults with diabetes   13-Mar-2020 08:30 AM 12.2 (H) 4.8 - 5.6 % Final    Comment:    (NOTE) Pre diabetes:          5.7%-6.4%  Diabetes:              >6.4%  Glycemic control for   <7.0% adults with diabetes     CBG: Recent Labs  Lab 03/10/20 0313 03/10/20 0452 03/10/20 0635 03/10/20 0807 03/10/20 1252  GLUCAP 163* 177* 180* 191* 306*    Review of Systems:   Shortness of breath  Past Medical History:  He,  has no past medical history on file.   Surgical History:   Past Surgical History:  Procedure Laterality Date  . Wisd[place] teeth       Social History:   reports that he has never smoked. He has never used smokeless tobacco. He reports current alcohol use. He reports that he does not use drugs.   Family History:  His family history includes Breast cancer in his mother; Heart disease in his father and mother.   Allergies No Known Allergies   Home Medications  Prior to Admission medications   Medication Sig  Start Date End Date Taking? Authorizing Provider  acetaminophen (TYLENOL) 500 MG tablet Take 1,000 mg by mouth every 6 (six) hours as needed for mild pain.   Yes [provider]  Ascorbic Acid (VITAMIN C) 100 MG tablet Take by mouth daily.   Yes [provider]  guaiFENesin (MUCINEX) 600 MG 12 hr tablet Take 600 mg by mouth 2 (two) times daily as needed for cough or to loosen phlegm.   Yes [provider]  omeprazole (PRILOSEC) 40 MG capsule Take 40 mg by mouth daily as needed (heartburn).   Yes [provider]  ondansetron (ZOFRAN ODT) 4 MG disintegrating tablet Take 1 tablet (4 mg total) by mouth every 8 (eight) hours as needed for nausea or vomiting. 03/08/20   Milagros Loll, MD  Probiotic Product (RESTORA) CAPS Take 1  capsule by mouth daily. Patient not taking: Reported on 02/19/2020 08/01/12   Beverley Fiedler, MD     The patient is critically ill with multiple organ systems failure and requires high complexity decision making for assessment and support, frequent evaluation and titration of therapies, application of advanced monitoring technologies and extensive interpretation of multiple databases. Critical Care Time devoted to patient care services described in this note independent of APP/resident time (if applicable)  is 45 minutes.   Virl Diamond MD [facility] Personal pager: [phone] If unanswered, please page CCM On-call: #[phone]

## 2020-03-10 NOTE — Progress Notes (Addendum)
PROGRESS NOTE    Alex [name]  FYB:[id number] DOB: 03-18-1970 DOA: 2020-03-19 PCP: Patient, No Pcp Per   Brief Narrative:  Alex [name] is a 49 y.o. male with no significant medical history significant. Presenting with dyspnea, cough, N/V. He reports that his symptoms began with cough 1 week ago. He tried some OTC meds but nothing help. Symptoms worsened to include body aches, fatigue, N/V 3 days later. Continued with OTC meds and fluids, but nothing seemed to help. He went to his PCP yesterday and got tested for COVID. He was positive. He went home with supportive care. This morning he woke up unable to breathe. He became concerned and came to the ED. Found he was COVID positive. Started on steroids. TRH was called for admission.   **UPDATE - afternoon of 29th patient became markedly more hypoxic, tachypneic, and tachycardic with labored breathing. Given our discussion this am in regards to intubation and agreement for escalation of support if necessary PCCM was consulted for intubation. Will continue to follow along - concern for PE given somewhat acute worsening respiratory status - dimer minimally elevated at 3 today - if continues to rise would evaluate for thrombosis.  Assessment & Plan:   Active Problems:   COVID-19   Pneumonia due to COVID-19 virus   Acute hypoxic respiratory failure secondary to Covid pna Likely superimposed/concurrent bacterial PNA - Steroids, remdesivir, inhalers, anti-tussives, IS, FV - Follow inflammatory markers - Procal is also elevated, cover with rocephin, Azithromycin for likely concurrent bacterial infection/pneumonia -Patient remains high risk given prolonged illness, advanced disease, multiple comorbidities, age and worsening hypoxia.  Patient agreeable for intubation if necessary.  Hyperglycemia w/ elevated anion gap; uncontrolled DM2 Lab Results  Component Value Date   HGBA1C 11.8 (H) 03-19-20  - No known history of DM - no real outpatient  follow up either - Start sliding scale/hypoglycemic protocol - Lactic acid is normal  Hypovolemic hyponatremia  -In the setting of poor p.o. intake and dehydration, continue to advance diet as above status post IV fluids  Elevated LFTs - In the setting of covid/vial infection and dehydration - Hepatitis panel pending, HIV negative  DVT prophylaxis: lovenox  Code Status: FULL  Family Communication: None  Status is: Inpatient  Dispo: The patient is from: Home              Anticipated d/c is to: Pending              Anticipated d/c date is: >72h              Patient currently not medically stable for discharge  Consultants:   None  Procedures:   None  Antimicrobials:  Remdesivir, azithromycin, ceftriaxone - 12/28-ongoing  Subjective: No acute issues or events overnight, respiratory status seems to be stabilizing, patient markedly short of breath with 3-4 word sentences at rest, somewhat anxious about his respiratory status, we did discuss advancement of supportive care including intubation which he was agreeable for if necessary.  Otherwise denies chest pain, nausea, vomiting, diarrhea, constipation, headache, fevers, chills.  Objective: Vitals:   03/19/2020 2000 03/10/20 0000 03/10/20 0400 03/10/20 0600  BP: (!) 155/96 138/76 116/74 124/75  Pulse: (!) 103 97 95 97  Resp: (!) 42 (!) 34 (!) 30 (!) 32  Temp: 98.9 F (37.2 C)  98.7 F (37.1 C)   TempSrc: Oral  Oral   SpO2: (!) 87% (!) 88% (!) 89% 91%  Weight:      Height:  Intake/Output Summary (Last 24 hours) at 03/10/2020 0743 Last data filed at 03/10/2020 0600 Gross per 24 hour  Intake 1261.22 ml  Output --  Net 1261.22 ml   Filed Weights   03/06/2020 0556  Weight: 72.1 kg    Examination:  General:  Pleasantly resting in bed, No acute distress.40 HEENT:  Normocephalic atraumatic.  Sclerae nonicteric, noninjected.  Extraocular movements intact bilaterally. Neck:  Without mass or deformity.  Trachea  is midline. Lungs: Diminished breath sounds bilaterally, marked rhonchi without overt rales Heart: Tachycardic with regular rhythm.  Without murmurs, rubs, or gallops. Abdomen:  Soft, nontender, nondistended.  Without guarding or rebound. Extremities: Without cyanosis, clubbing, edema, or obvious deformity. Vascular:  Dorsalis pedis and posterior tibial pulses palpable bilaterally. Skin:  Warm and dry, no erythema, no ulcerations.    Data Reviewed: I have personally reviewed following labs and imaging studies  CBC: Recent Labs  Lab 03/08/2020 0830 03/10/20 0222  WBC 6.3 6.4  NEUTROABS 5.9 5.9  HGB 15.7 14.1  HCT 45.2 40.2  MCV 83.7 83.2  PLT 259 302   Basic Metabolic Panel: Recent Labs  Lab 02/18/2020 0830 02/16/2020 1810 02/11/2020 2254 03/10/20 0222  NA 125* 128* 128* 132*  K 4.3 3.9 4.1 3.8  CL 84* 91* 94* 97*  CO2 21* 20* 22 25  GLUCOSE 335* 365* 209* 188*  BUN 13 15 16 16   CREATININE 1.22 1.15 0.95 0.93  CALCIUM 8.8* 8.1* 7.9* 8.0*  MG  --   --   --  2.8*   GFR: Estimated Creatinine Clearance: 89.4 mL/min (by C-G formula based on SCr of 0.93 mg/dL). Liver Function Tests: Recent Labs  Lab 03/02/2020 0830 02/14/2020 1810 03/10/20 0222  AST 76* 96* 117*  ALT 66* 70* 76*  ALKPHOS 84 94 92  BILITOT 1.3* 1.3* 0.7  PROT 7.7 7.5 6.4*  ALBUMIN 3.8 3.4* 3.0*   No results for input(s): LIPASE, AMYLASE in the last 168 hours. No results for input(s): AMMONIA in the last 168 hours. Coagulation Profile: No results for input(s): INR, PROTIME in the last 168 hours. Cardiac Enzymes: No results for input(s): CKTOTAL, CKMB, CKMBINDEX, TROPONINI in the last 168 hours. BNP (last 3 results) No results for input(s): PROBNP in the last 8760 hours. HbA1C: Recent Labs    02/27/2020 0830 02/14/2020 1810  HGBA1C 12.2* 11.8*   CBG: Recent Labs  Lab 03/10/20 0113 03/10/20 0210 03/10/20 0313 03/10/20 0452 03/10/20 0635  GLUCAP 178* 197* 163* 177* 180*   Lipid Profile: Recent  Labs    02/22/2020 0830  TRIG 137   Thyroid Function Tests: No results for input(s): TSH, T4TOTAL, FREET4, T3FREE, THYROIDAB in the last 72 hours. Anemia Panel: Recent Labs    02/21/2020 0830 03/10/20 0222  FERRITIN 915* 1,095*   Sepsis Labs: Recent Labs  Lab 02/24/2020 0830  PROCALCITON 18.38  LATICACIDVEN 1.4    Recent Results (from the past 240 hour(s))  Resp Panel by RT-PCR (Flu A&B, Covid) Nasopharyngeal Swab     Status: Abnormal   Collection Time: 03/08/20  7:55 AM   Specimen: Nasopharyngeal Swab; Nasopharyngeal(NP) swabs in vial transport medium  Result Value Ref Range Status   SARS Coronavirus 2 by RT PCR POSITIVE (A) NEGATIVE Final    Comment: RESULT CALLED TO, READ BACK BY AND VERIFIED WITH: BAILEY J. RN AT 0920 BY SROY ON 03/10/20 (NOTE) SARS-CoV-2 target nucleic acids are DETECTED.  The SARS-CoV-2 RNA is generally detectable in upper respiratory specimens during the acute phase of infection. Positive  results are indicative of the presence of the identified virus, but do not rule out bacterial infection or co-infection with other pathogens not detected by the test. Clinical correlation with patient history and other diagnostic information is necessary to determine patient infection status. The expected result is Negative.  Fact Sheet for Patients: BloggerCourse.com  Fact Sheet for Healthcare Providers: SeriousBroker.it  This test is not yet approved or cleared by the [place] FDA and  has been authorized for detection and/or diagnosis of SARS-CoV-2 by FDA under an Emergency Use Authorization (EUA).  This EUA will remain in effect (meaning this test c an be used) for the duration of  the COVID-19 declaration under Section 564(b)(1) of the Act, 21 U.S.C. section 360bbb-3(b)(1), unless the authorization is terminated or revoked sooner.     Influenza A by PCR NEGATIVE NEGATIVE Final   Influenza B by PCR  NEGATIVE NEGATIVE Final    Comment: (NOTE) The Xpert Xpress SARS-CoV-2/FLU/RSV plus assay is intended as an aid in the diagnosis of influenza from Nasopharyngeal swab specimens and should not be used as a sole basis for treatment. Nasal washings and aspirates are unacceptable for Xpert Xpress SARS-CoV-2/FLU/RSV testing.  Fact Sheet for Patients: BloggerCourse.com  Fact Sheet for Healthcare Providers: SeriousBroker.it  This test is not yet approved or cleared by the [place] FDA and has been authorized for detection and/or diagnosis of SARS-CoV-2 by FDA under an Emergency Use Authorization (EUA). This EUA will remain in effect (meaning this test can be used) for the duration of the COVID-19 declaration under Section 564(b)(1) of the Act, 21 U.S.C. section 360bbb-3(b)(1), unless the authorization is terminated or revoked.  Performed at [facility], [address] [place], [place] [address]   Blood Culture (routine x 2)     Status: None (Preliminary result)   Collection Time: 03/03/2020  8:30 AM   Specimen: BLOOD  Result Value Ref Range Status   Specimen Description   Final    BLOOD LEFT ARM Performed at [facility], [address], [place], [place] [address]    Special Requests   Final    BOTTLES DRAWN AEROBIC AND ANAEROBIC Blood Culture adequate volume Performed at [facility] [address] [place], [place] [address]    Culture   Final    NO GROWTH < 24 HOURS Performed at [facility], [address] [place], [place] [address]    Report Status PENDING  Incomplete  MRSA PCR Screening     Status: None   Collection Time: 02/27/2020  7:39 PM   Specimen: Nasopharyngeal  Result Value Ref Range Status   MRSA by PCR NEGATIVE NEGATIVE Final    Comment:        The GeneXpert MRSA Assay (FDA approved for NASAL specimens only), is one component of a comprehensive MRSA  colonization surveillance program. It is not intended to diagnose MRSA infection nor to guide or monitor treatment for MRSA infections. Performed at [facility], [address], [place], [place] [address]          Radiology Studies: DG Chest 2 View  Result Date: 02/17/2020 CLINICAL DATA:  49 year old male with chest pain and shortness of breath for 5 days. Nausea vomiting. Positive for COVID-19 yesterday. EXAM: CHEST - 2 VIEW COMPARISON:  Portable chest 03/08/2020. FINDINGS: Widespread patchy but indistinct bilateral mid and lower lung opacity. On the lateral view middle and lower lobe involvement is suggested. Left lung opacity does  appear increased since yesterday. No superimposed pneumothorax or pleural effusion. Mediastinal contours remain within normal limits. Visualized tracheal air column is within normal limits. No osseous abnormality identified. Negative visible bowel gas pattern. IMPRESSION: Bilateral COVID-19 pneumonia with mild progression since yesterday. No pleural effusion. Electronically Signed   By: Odessa FlemingH  Hall M.D.   On: 02/21/2020 07:33   DG Chest Portable 1 View  Result Date: 03/08/2020 CLINICAL DATA:  COVID, shortness of breath. EXAM: PORTABLE CHEST 1 VIEW COMPARISON:  None. FINDINGS: Bibasilar peripheral predominant airspace opacities. No visible pleural effusions or pneumothorax. Cardiac silhouette is accentuated by AP technique and low lung volumes. No acute osseous abnormality. IMPRESSION: Bibasilar peripheral predominant airspace opacities, compatible with multifocal pneumonia and reported COVID diagnosis. Electronically Signed   By: Feliberto HartsFrederick S Jones MD   On: 03/08/2020 08:42        Scheduled Meds: . vitamin C  500 mg Oral Daily  . Chlorhexidine Gluconate Cloth  6 each Topical Daily  . enoxaparin (LOVENOX) injection  40 mg Subcutaneous Q24H  . insulin aspart  0-15 Units Subcutaneous TID WC  . insulin aspart  0-5 Units Subcutaneous QHS  .  insulin glargine  10 Units Subcutaneous Daily  . Ipratropium-Albuterol  1 puff Inhalation Q6H  . mouth rinse  15 mL Mouth Rinse BID  . mouth rinse  15 mL Mouth Rinse q12n4p  . methylPREDNISolone (SOLU-MEDROL) injection  40 mg Intravenous Q12H   Followed by  . [START ON 03/13/2020] predniSONE  50 mg Oral Daily  . zinc sulfate  220 mg Oral Daily   Continuous Infusions: . sodium chloride Stopped (03/07/2020 1943)  . dextrose 5% lactated ringers Stopped (03/10/20 0545)  . insulin Stopped (03/10/20 0636)  . lactated ringers    . remdesivir 100 mg in NS 100 mL       LOS: 1 day    Time spent: 40 min    Alex FallenWilliam C Reyes Fifield, DO [facility]  If 7PM-7AM, please contact night-coverage [email or web address]  03/10/2020, 7:43 AM

## 2020-03-11 ENCOUNTER — Ambulatory Visit (HOSPITAL_COMMUNITY): Admit: 2020-03-11 | Payer: Self-pay | Admitting: Cardiothoracic Surgery

## 2020-03-11 ENCOUNTER — Inpatient Hospital Stay (HOSPITAL_COMMUNITY): Payer: Medicaid Other

## 2020-03-11 ENCOUNTER — Inpatient Hospital Stay (HOSPITAL_COMMUNITY): Admission: EM | Disposition: E | Payer: Self-pay | Source: Home / Self Care | Attending: Internal Medicine

## 2020-03-11 DIAGNOSIS — J9621 Acute and chronic respiratory failure with hypoxia: Secondary | ICD-10-CM

## 2020-03-11 DIAGNOSIS — U071 COVID-19: Secondary | ICD-10-CM

## 2020-03-11 DIAGNOSIS — J9690 Respiratory failure, unspecified, unspecified whether with hypoxia or hypercapnia: Secondary | ICD-10-CM | POA: Diagnosis present

## 2020-03-11 DIAGNOSIS — J9601 Acute respiratory failure with hypoxia: Secondary | ICD-10-CM

## 2020-03-11 DIAGNOSIS — R7989 Other specified abnormal findings of blood chemistry: Secondary | ICD-10-CM

## 2020-03-11 DIAGNOSIS — J80 Acute respiratory distress syndrome: Secondary | ICD-10-CM

## 2020-03-11 HISTORY — PX: TEE WITHOUT CARDIOVERSION: SHX5443

## 2020-03-11 HISTORY — PX: ECMO CANNULATION: CATH118321

## 2020-03-11 HISTORY — PX: CENTRAL LINE INSERTION: CATH118232

## 2020-03-11 LAB — CBC
HCT: 37.4 % — ABNORMAL LOW (ref 39.0–52.0)
HCT: 31.2 % — ABNORMAL LOW (ref 39.0–52.0)
Hemoglobin: 12.7 g/dL — ABNORMAL LOW (ref 13.0–17.0)
Hemoglobin: 10.6 g/dL — ABNORMAL LOW (ref 13.0–17.0)
MCH: 28.9 pg (ref 26.0–34.0)
MCH: 29.3 pg (ref 26.0–34.0)
MCHC: 34 g/dL (ref 30.0–36.0)
MCHC: 34 g/dL (ref 30.0–36.0)
MCV: 85 fL (ref 80.0–100.0)
MCV: 86.2 fL (ref 80.0–100.0)
Platelets: 186 10*3/uL (ref 150–400)
Platelets: 156 10*3/uL (ref 150–400)
RBC: 4.4 MIL/uL (ref 4.22–5.81)
RBC: 3.62 MIL/uL — ABNORMAL LOW (ref 4.22–5.81)
RDW: 13.2 % (ref 11.5–15.5)
RDW: 13.2 % (ref 11.5–15.5)
WBC: 6.6 10*3/uL (ref 4.0–10.5)
WBC: 6.7 10*3/uL (ref 4.0–10.5)
nRBC: 0 % (ref 0.0–0.2)
nRBC: 0 % (ref 0.0–0.2)

## 2020-03-11 LAB — POCT I-STAT 7, (LYTES, BLD GAS, ICA,H+H)
Acid-Base Excess: 1 mmol/L (ref 0.0–2.0)
Acid-Base Excess: 0 mmol/L (ref 0.0–2.0)
Acid-base deficit: 7 mmol/L — ABNORMAL HIGH (ref 0.0–2.0)
Bicarbonate: 20.1 mmol/L (ref 20.0–28.0)
Bicarbonate: 23.9 mmol/L (ref 20.0–28.0)
Bicarbonate: 25.2 mmol/L (ref 20.0–28.0)
Calcium, Ion: 0.85 mmol/L — CL (ref 1.15–1.40)
Calcium, Ion: 0.9 mmol/L — ABNORMAL LOW (ref 1.15–1.40)
Calcium, Ion: 0.94 mmol/L — ABNORMAL LOW (ref 1.15–1.40)
HCT: 36 % — ABNORMAL LOW (ref 39.0–52.0)
HCT: 36 % — ABNORMAL LOW (ref 39.0–52.0)
HCT: 30 % — ABNORMAL LOW (ref 39.0–52.0)
Hemoglobin: 12.2 g/dL — ABNORMAL LOW (ref 13.0–17.0)
Hemoglobin: 12.2 g/dL — ABNORMAL LOW (ref 13.0–17.0)
Hemoglobin: 10.2 g/dL — ABNORMAL LOW (ref 13.0–17.0)
O2 Saturation: 100 %
O2 Saturation: 100 %
O2 Saturation: 100 %
Patient temperature: 36
Patient temperature: 36
Patient temperature: 36.7
Potassium: 4.9 mmol/L (ref 3.5–5.1)
Potassium: 4.7 mmol/L (ref 3.5–5.1)
Potassium: 4.1 mmol/L (ref 3.5–5.1)
Sodium: 133 mmol/L — ABNORMAL LOW (ref 135–145)
Sodium: 132 mmol/L — ABNORMAL LOW (ref 135–145)
Sodium: 134 mmol/L — ABNORMAL LOW (ref 135–145)
TCO2: 21 mmol/L — ABNORMAL LOW (ref 22–32)
TCO2: 25 mmol/L (ref 22–32)
TCO2: 26 mmol/L (ref 22–32)
pCO2 arterial: 43.6 mmHg (ref 32.0–48.0)
pCO2 arterial: 30.5 mmHg — ABNORMAL LOW (ref 32.0–48.0)
pCO2 arterial: 41.5 mmHg (ref 32.0–48.0)
pH, Arterial: 7.266 — ABNORMAL LOW (ref 7.350–7.450)
pH, Arterial: 7.498 — ABNORMAL HIGH (ref 7.350–7.450)
pH, Arterial: 7.39 (ref 7.350–7.450)
pO2, Arterial: 190 mmHg — ABNORMAL HIGH (ref 83.0–108.0)
pO2, Arterial: 370 mmHg — ABNORMAL HIGH (ref 83.0–108.0)
pO2, Arterial: 420 mmHg — ABNORMAL HIGH (ref 83.0–108.0)

## 2020-03-11 LAB — BLOOD GAS, ARTERIAL
Acid-base deficit: 1.4 mmol/L (ref 0.0–2.0)
Acid-base deficit: 4.8 mmol/L — ABNORMAL HIGH (ref 0.0–2.0)
Bicarbonate: 24.9 mmol/L (ref 20.0–28.0)
Bicarbonate: 23.1 mmol/L (ref 20.0–28.0)
Drawn by: 23281
Drawn by: 270211
FIO2: 100
FIO2: 100
MECHVT: 350 mL
MECHVT: 350 mL
O2 Saturation: 85.3 %
O2 Saturation: 79 %
PEEP: 18 cmH2O
PEEP: 18 cmH2O
Patient temperature: 98.5
Patient temperature: 98.6
RATE: 35 resp/min
RATE: 35 resp/min
pCO2 arterial: 49.9 mmHg — ABNORMAL HIGH (ref 32.0–48.0)
pCO2 arterial: 56.3 mmHg — ABNORMAL HIGH (ref 32.0–48.0)
pH, Arterial: 7.319 — ABNORMAL LOW (ref 7.350–7.450)
pH, Arterial: 7.237 — ABNORMAL LOW (ref 7.350–7.450)
pO2, Arterial: 55 mmHg — ABNORMAL LOW (ref 83.0–108.0)
pO2, Arterial: 52.4 mmHg — ABNORMAL LOW (ref 83.0–108.0)

## 2020-03-11 LAB — CBC WITH DIFFERENTIAL/PLATELET
Abs Immature Granulocytes: 0.04 10*3/uL (ref 0.00–0.07)
Basophils Absolute: 0 10*3/uL (ref 0.0–0.1)
Basophils Relative: 0 %
Eosinophils Absolute: 0 10*3/uL (ref 0.0–0.5)
Eosinophils Relative: 0 %
HCT: 44.2 % (ref 39.0–52.0)
Hemoglobin: 14.8 g/dL (ref 13.0–17.0)
Immature Granulocytes: 1 %
Lymphocytes Relative: 8 %
Lymphs Abs: 0.4 10*3/uL — ABNORMAL LOW (ref 0.7–4.0)
MCH: 28.8 pg (ref 26.0–34.0)
MCHC: 33.5 g/dL (ref 30.0–36.0)
MCV: 86.2 fL (ref 80.0–100.0)
Monocytes Absolute: 0.2 10*3/uL (ref 0.1–1.0)
Monocytes Relative: 3 %
Neutro Abs: 4.4 10*3/uL (ref 1.7–7.7)
Neutrophils Relative %: 88 %
Platelets: 206 10*3/uL (ref 150–400)
RBC: 5.13 MIL/uL (ref 4.22–5.81)
RDW: 12.9 % (ref 11.5–15.5)
WBC Morphology: INCREASED
WBC: 5 10*3/uL (ref 4.0–10.5)
nRBC: 0 % (ref 0.0–0.2)

## 2020-03-11 LAB — ECHOCARDIOGRAM LIMITED
Area-P 1/2: 5.62 cm2
Height: 64 in
S' Lateral: 2.2 cm
Weight: 2544 oz

## 2020-03-11 LAB — MAGNESIUM
Magnesium: 3.3 mg/dL — ABNORMAL HIGH (ref 1.7–2.4)
Magnesium: 2.9 mg/dL — ABNORMAL HIGH (ref 1.7–2.4)

## 2020-03-11 LAB — PROTIME-INR
INR: 2 — ABNORMAL HIGH (ref 0.8–1.2)
Prothrombin Time: 21.8 seconds — ABNORMAL HIGH (ref 11.4–15.2)

## 2020-03-11 LAB — POCT I-STAT EG7
Acid-base deficit: 4 mmol/L — ABNORMAL HIGH (ref 0.0–2.0)
Bicarbonate: 22.9 mmol/L (ref 20.0–28.0)
Calcium, Ion: 0.93 mmol/L — ABNORMAL LOW (ref 1.15–1.40)
HCT: 36 % — ABNORMAL LOW (ref 39.0–52.0)
Hemoglobin: 12.2 g/dL — ABNORMAL LOW (ref 13.0–17.0)
O2 Saturation: 82 %
Patient temperature: 37.4
Potassium: 4.6 mmol/L (ref 3.5–5.1)
Sodium: 133 mmol/L — ABNORMAL LOW (ref 135–145)
TCO2: 24 mmol/L (ref 22–32)
pCO2, Ven: 48.1 mmHg (ref 44.0–60.0)
pH, Ven: 7.287 (ref 7.250–7.430)
pO2, Ven: 53 mmHg — ABNORMAL HIGH (ref 32.0–45.0)

## 2020-03-11 LAB — GLUCOSE, CAPILLARY
Glucose-Capillary: 255 mg/dL — ABNORMAL HIGH (ref 70–99)
Glucose-Capillary: 277 mg/dL — ABNORMAL HIGH (ref 70–99)
Glucose-Capillary: 279 mg/dL — ABNORMAL HIGH (ref 70–99)
Glucose-Capillary: 281 mg/dL — ABNORMAL HIGH (ref 70–99)
Glucose-Capillary: 282 mg/dL — ABNORMAL HIGH (ref 70–99)
Glucose-Capillary: 345 mg/dL — ABNORMAL HIGH (ref 70–99)

## 2020-03-11 LAB — C-REACTIVE PROTEIN: CRP: 20.6 mg/dL — ABNORMAL HIGH (ref <1.0)

## 2020-03-11 LAB — BASIC METABOLIC PANEL
Anion gap: 12 (ref 5–15)
Anion gap: 11 (ref 5–15)
BUN: 54 mg/dL — ABNORMAL HIGH (ref 6–20)
BUN: 51 mg/dL — ABNORMAL HIGH (ref 6–20)
CO2: 19 mmol/L — ABNORMAL LOW (ref 22–32)
CO2: 20 mmol/L — ABNORMAL LOW (ref 22–32)
Calcium: 6.3 mg/dL — CL (ref 8.9–10.3)
Calcium: 6.1 mg/dL — CL (ref 8.9–10.3)
Chloride: 97 mmol/L — ABNORMAL LOW (ref 98–111)
Chloride: 101 mmol/L (ref 98–111)
Creatinine, Ser: 1.98 mg/dL — ABNORMAL HIGH (ref 0.61–1.24)
Creatinine, Ser: 1.81 mg/dL — ABNORMAL HIGH (ref 0.61–1.24)
GFR, Estimated: 41 mL/min — ABNORMAL LOW (ref >60)
GFR, Estimated: 46 mL/min — ABNORMAL LOW (ref >60)
Glucose, Bld: 327 mg/dL — ABNORMAL HIGH (ref 70–99)
Glucose, Bld: 290 mg/dL — ABNORMAL HIGH (ref 70–99)
Potassium: 4.7 mmol/L (ref 3.5–5.1)
Potassium: 4.2 mmol/L (ref 3.5–5.1)
Sodium: 128 mmol/L — ABNORMAL LOW (ref 135–145)
Sodium: 132 mmol/L — ABNORMAL LOW (ref 135–145)

## 2020-03-11 LAB — HEPATIC FUNCTION PANEL
ALT: 48 U/L — ABNORMAL HIGH (ref 0–44)
AST: 63 U/L — ABNORMAL HIGH (ref 15–41)
Albumin: 2.2 g/dL — ABNORMAL LOW (ref 3.5–5.0)
Alkaline Phosphatase: 96 U/L (ref 38–126)
Bilirubin, Direct: 0.1 mg/dL (ref 0.0–0.2)
Indirect Bilirubin: 0.4 mg/dL (ref 0.3–0.9)
Total Bilirubin: 0.5 mg/dL (ref 0.3–1.2)
Total Protein: 4.1 g/dL — ABNORMAL LOW (ref 6.5–8.1)

## 2020-03-11 LAB — PHOSPHORUS
Phosphorus: 4.3 mg/dL (ref 2.5–4.6)
Phosphorus: 3.5 mg/dL (ref 2.5–4.6)

## 2020-03-11 LAB — LACTIC ACID, PLASMA
Lactic Acid, Venous: 2.1 mmol/L (ref 0.5–1.9)
Lactic Acid, Venous: 1.8 mmol/L (ref 0.5–1.9)

## 2020-03-11 LAB — LACTATE DEHYDROGENASE: LDH: 494 U/L — ABNORMAL HIGH (ref 98–192)

## 2020-03-11 LAB — FERRITIN: Ferritin: 1214 ng/mL — ABNORMAL HIGH (ref 24–336)

## 2020-03-11 LAB — POCT ACTIVATED CLOTTING TIME
Activated Clotting Time: 255 seconds
Activated Clotting Time: 285 seconds

## 2020-03-11 LAB — PREPARE RBC (CROSSMATCH)

## 2020-03-11 LAB — FIBRINOGEN: Fibrinogen: 232 mg/dL (ref 210–475)

## 2020-03-11 LAB — TRIGLYCERIDES: Triglycerides: 496 mg/dL — ABNORMAL HIGH (ref <150)

## 2020-03-11 LAB — ABO/RH: ABO/RH(D): B POS

## 2020-03-11 LAB — D-DIMER, QUANTITATIVE: D-Dimer, Quant: >20 ug/mL-FEU — ABNORMAL HIGH (ref 0.00–0.50)

## 2020-03-11 SURGERY — ECMO CANNULATION
Anesthesia: LOCAL

## 2020-03-11 MED ORDER — SODIUM CHLORIDE 0.9% FLUSH: 10.0000 mL | INTRAVENOUS | Status: DC | PRN

## 2020-03-11 MED ORDER — HEPARIN BOLUS VIA INFUSION
3000.0000 [IU] | Freq: Once | INTRAVENOUS | Status: AC
  Administered 2020-03-11: 10:00:00 3000 [IU] via INTRAVENOUS
  Filled 2020-03-11: qty 3000

## 2020-03-11 MED ORDER — ALBUMIN HUMAN 5 % IV SOLN
INTRAVENOUS | Status: AC
  Administered 2020-03-11: 20:00:00 12.5 g via INTRAVENOUS
  Filled 2020-03-11: qty 500

## 2020-03-11 MED ORDER — SODIUM CHLORIDE 0.9% IV SOLUTION: Freq: Once | INTRAVENOUS | Status: AC

## 2020-03-11 MED ORDER — HEPARIN SODIUM (PORCINE) 1000 UNIT/ML IJ SOLN
INTRAMUSCULAR | Status: AC
  Filled 2020-03-11: qty 1

## 2020-03-11 MED ORDER — SODIUM CHLORIDE 0.9% FLUSH
10.0000 mL | Freq: Two times a day (BID) | INTRAVENOUS | Status: DC
  Administered 2020-03-11: 21:00:00 30 mL
  Administered 2020-03-11: 11:00:00 20 mL
  Administered 2020-03-12 – 2020-03-28 (×32): 10 mL
  Administered 2020-03-29: 20 mL
  Administered 2020-03-29 – 2020-04-09 (×20): 10 mL
  Administered 2020-04-09 – 2020-04-10 (×2): 20 mL
  Administered 2020-04-10 – 2020-04-11 (×2): 10 mL
  Administered 2020-04-11: 20 mL
  Administered 2020-04-12 – 2020-04-28 (×30): 10 mL

## 2020-03-11 MED ORDER — ALBUMIN HUMAN 5 % IV SOLN: 12.5000 g | INTRAVENOUS | Status: DC | PRN

## 2020-03-11 MED ORDER — SODIUM CHLORIDE 0.9 % IV SOLN: 2.0000 g | Freq: Once | INTRAVENOUS | Status: DC

## 2020-03-11 MED ORDER — LIDOCAINE HCL (PF) 1 % IJ SOLN
INTRAMUSCULAR | Status: DC | PRN
  Administered 2020-03-11: 15 mL

## 2020-03-11 MED ORDER — ALBUMIN HUMAN 5 % IV SOLN
12.5000 g | INTRAVENOUS | Status: DC | PRN
  Administered 2020-03-11 – 2020-03-27 (×20): 12.5 g via INTRAVENOUS
  Filled 2020-03-11 (×23): qty 250

## 2020-03-11 MED ORDER — INSULIN ASPART 100 UNIT/ML ~~LOC~~ SOLN
5.0000 [IU] | SUBCUTANEOUS | Status: DC
  Administered 2020-03-11 – 2020-03-12 (×8): 5 [IU] via SUBCUTANEOUS

## 2020-03-11 MED ORDER — ONDANSETRON HCL 4 MG/2ML IJ SOLN
4.0000 mg | Freq: Four times a day (QID) | INTRAMUSCULAR | Status: DC | PRN
  Administered 2020-03-13 – 2020-05-08 (×17): 4 mg via INTRAVENOUS
  Filled 2020-03-11 (×19): qty 2

## 2020-03-11 MED ORDER — INSULIN GLARGINE 100 UNIT/ML ~~LOC~~ SOLN
10.0000 [IU] | Freq: Two times a day (BID) | SUBCUTANEOUS | Status: DC
  Administered 2020-03-11 (×2): 10 [IU] via SUBCUTANEOUS
  Filled 2020-03-11 (×5): qty 0.1

## 2020-03-11 MED ORDER — CALCIUM GLUCONATE-NACL 2-0.675 GM/100ML-% IV SOLN
2.0000 g | Freq: Once | INTRAVENOUS | Status: AC
  Administered 2020-03-11: 23:00:00 2000 mg via INTRAVENOUS
  Filled 2020-03-11: qty 100

## 2020-03-11 MED ORDER — VITAL HIGH PROTEIN PO LIQD
1000.0000 mL | ORAL | Status: DC
  Administered 2020-03-11: 21:00:00 1000 mL

## 2020-03-11 MED ORDER — TOCILIZUMAB 400 MG/20ML IV SOLN
8.0000 mg/kg | Freq: Once | INTRAVENOUS | Status: AC
  Administered 2020-03-11: 576 mg via INTRAVENOUS
  Filled 2020-03-11: qty 20

## 2020-03-11 MED ORDER — NOREPINEPHRINE 16 MG/250ML-% IV SOLN
0.0000 ug/min | INTRAVENOUS | Status: DC
  Administered 2020-03-11: 21:00:00 4 ug/min via INTRAVENOUS
  Filled 2020-03-11: qty 250

## 2020-03-11 MED ORDER — PROSOURCE TF PO LIQD: 45.0000 mL | Freq: Three times a day (TID) | ORAL | Status: DC

## 2020-03-11 MED ORDER — SODIUM CHLORIDE 0.9 % IV SOLN
0.0500 mg/kg/h | INTRAVENOUS | Status: DC
  Administered 2020-03-11: 0.05 mg/kg/h via INTRAVENOUS
  Filled 2020-03-11 (×2): qty 250

## 2020-03-11 MED ORDER — HEPARIN (PORCINE) IN NACL 1000-0.9 UT/500ML-% IV SOLN
INTRAVENOUS | Status: DC | PRN
  Administered 2020-03-11: 500 mL

## 2020-03-11 MED ORDER — LIDOCAINE HCL (PF) 1 % IJ SOLN
INTRAMUSCULAR | Status: AC
  Filled 2020-03-11: qty 30

## 2020-03-11 MED ORDER — HEPARIN SODIUM (PORCINE) 1000 UNIT/ML IJ SOLN
INTRAMUSCULAR | Status: AC
  Filled 2020-03-11: qty 2

## 2020-03-11 MED ORDER — SODIUM CHLORIDE 0.9 % IV SOLN
INTRAVENOUS | Status: DC | PRN
  Administered 2020-03-11 – 2020-03-19 (×4): 1000 mL via INTRAVENOUS
  Administered 2020-04-09 – 2020-04-16 (×5): 250 mL via INTRAVENOUS

## 2020-03-11 MED ORDER — HEPARIN SODIUM (PORCINE) 1000 UNIT/ML IJ SOLN
INTRAMUSCULAR | Status: DC | PRN
  Administered 2020-03-11: 2000 [IU] via INTRAVENOUS

## 2020-03-11 MED ORDER — SODIUM CHLORIDE 0.9 % IV SOLN
INTRAVENOUS | Status: DC | PRN
  Administered 2020-03-11 – 2020-03-19 (×3): 1000 mL via INTRAVENOUS
  Administered 2020-03-21: 250 mL via INTRAVENOUS
  Administered 2020-03-24: 1000 mL via INTRAVENOUS
  Administered 2020-04-01 – 2020-04-07 (×3): 250 mL via INTRAVENOUS

## 2020-03-11 MED ORDER — HEPARIN (PORCINE) 25000 UT/250ML-% IV SOLN
1200.0000 [IU]/h | INTRAVENOUS | Status: DC
  Administered 2020-03-11: 10:00:00 1200 [IU]/h via INTRAVENOUS
  Filled 2020-03-11: qty 250

## 2020-03-11 MED ORDER — PROSOURCE TF PO LIQD
45.0000 mL | Freq: Two times a day (BID) | ORAL | Status: DC
  Administered 2020-03-11 – 2020-03-12 (×2): 45 mL
  Filled 2020-03-11 (×2): qty 45

## 2020-03-11 MED ORDER — PIVOT 1.5 CAL PO LIQD
1000.0000 mL | ORAL | Status: DC
  Filled 2020-03-11: qty 1000

## 2020-03-11 MED ORDER — LACTATED RINGERS IV SOLN: INTRAVENOUS | Status: DC

## 2020-03-11 SURGICAL SUPPLY — 8 items
CATH DUAL LUMEN CRESCENT 32FR (CATHETERS) ×1 IMPLANT
KIT CV MULTILUMEN 7FR 20 (SET/KITS/TRAYS/PACK) ×3
KIT CV MULTILUMEN 7FR 20 SUB (SET/KITS/TRAYS/PACK) ×1 IMPLANT
KIT DILATOR VASC 18G NDL (KITS) ×1 IMPLANT
PACK CARDIAC CATHETERIZATION (CUSTOM PROCEDURE TRAY) ×1 IMPLANT
SHEATH PINNACLE 7F 10CM (SHEATH) ×1 IMPLANT
SHEATH PINNACLE 8F 10CM (SHEATH) ×4 IMPLANT
WIRE EMERALD 3MM-J .035X150CM (WIRE) ×3 IMPLANT

## 2020-03-11 NOTE — CV Procedure (Signed)
°  ECMO INITIATION   Patient: Alex [name], 1970-07-14, 49 y.o. Location:   Date of Service:  03/01/2020     Time: 7:46 PM  Date of Admission: March 27, 2020 Admitting diagnosis: Pneumonia due to COVID-19 virus  Ht: 5\' 4"  (162.6 cm) Wt: 72.1 kg BSA: Body surface area is 1.8 meters squared.  Blood Type: PENDING Allergies: No Known Allergies  Past medical history: History reviewed. No pertinent past medical history. Past surgical history:  Past Surgical History:  Procedure Laterality Date   [place] teeth      Indication for ECMO: ARDS  ECMO was deployed at 1642 and initiated at 2  Anticoagulation achieved with Heparin bolus of 2,000 units given to patient at 1655. Cannulated for ECMO Mode: VV and achieved initial ECMO Flow (LPM): 3.58 and ECMO Sweep Gas (LPM): 2.    ECMO Cannula Information     Staff Present  Primary Perfusionist [facility]  Assisting Perfusionist/ECMO Specialist [facility]  Cannulating Physician Devota Pace   ECMO Lot Numbers  CardioHelp Console  2  Oxygenator  Valentina Lucks  Tubing Pack [id number]  ECMO Goals  Flow goal      Anticoagulation goal      Cardiac goal      Respiratory goal      Other goal       ECMO Handoff  Patient Information * Age Height Weight BSA IBW BMI  49 y.o. 5\' 4"  (162.6 cm)  (72.1 kg Body surface area is 1.8 meters squared. 59.1 kg  Body mass index is 27.29 kg/m.   Review History * Primary Diagnosis   Pneumonia due to COVID-19 virus  Prior Cardiac Arrest within 24hrs of ECMO initiation?   ECMO and MCS * Type ECMO Flow ECMO Sweep Gases   ECMO Device: Cardiohelp   Flow (LPM): 3.58   Sweep Gas (LPM): 2     Additional Mechanical Support   Ventilation *    $ Ventilator Initial/Subsequent : Initial, Vent Mode: (S) PCV, Vt Set: 350 mL, Set Rate: 35 bmp, FiO2 (%): 100 %, I Time: 0.9 Sec(s), PEEP: (S) 10 cmH20     Cannula Size and Locations       Drainage 32 Fr Crescent  Return 32 Fr Crescent    *Cannula(e) sutured and anchored, secured and dressed.   Labs and Imaging *  *Cannulation position verified via imaging on arrival to ICU. Concerns communicated to attending surgeon. Labs reviewed.   All ECMO safety checks complete. ECMO flowsheet initiated, applicable charges captured, LDA's entered/confirmed, imaging and labs verified, blood products available, and report given to [phone].

## 2020-03-11 NOTE — Progress Notes (Signed)
  Echocardiogram Echocardiogram Transesophageal has been performed.  Delcie Roch 03/15/20, 7:16 PM

## 2020-03-11 NOTE — Progress Notes (Signed)
Initial Nutrition Assessment  DOCUMENTATION CODES:   Not applicable  INTERVENTION:  - will adjust TF order: Pivot 1.5 @ 25 ml/hr to advance by 10 ml every 4 hours to reach goal rate of 45 ml/hr with 45 ml Prosource TF TID.  - at goal rate, this regimen + kcal from current propofol rate will provide 2083 kcal (96% kcal need), 127 grams protein, and 820 ml free water. - free water flush, if desired, to be per CCM.  NUTRITION DIAGNOSIS:   Increased nutrient needs related to acute illness,catabolic illness (COVID-19 infection) as evidenced by estimated needs.  GOAL:   Patient will meet greater than or equal to 90% of their needs  MONITOR:   Vent status,TF tolerance,Labs,Weight trends  REASON FOR ASSESSMENT:   Ventilator,Consult Enteral/tube feeding initiation and management  ASSESSMENT:   49 y.o. male with no significant medical history. He presented to the ED with dyspnea, cough, and N/V. He reported that his symptoms began with cough 1 week ago. He tried OTC meds but nothing helped. Symptoms worsened to include body aches, fatigue, and N/V 3 days later. He went to his PCP 12/26 and got tested for COVID; he was positive.  Patient was discussed in rounds this AM.   Patient is intubated and has OGT in place; abdominal xray report from 12/29 states tube in the distal stomach vs proximal duodenum.   He has been receiving TF per protocol order set: Vital High Protein @ 40 ml/hr with 45 ml Prosource TF BID. This regimen provides 1040 kcal, 106 grams protein, and 802 ml free water.   RN turned TF off at the time of RD visit a short time ago as patient is being transferred to [facility] to be cannulated and started on ECMO.   RN denies any issues with TF while it was running this shift. He has flexiseal in place. RN conveys that in report she was told that he had diarrhea during night shift, but she reports very little output from flexi this shift.   Weight on 12/28 was 159 lb, weight  on 12/27 was 160 lb, and prior to that the most recently documented weight was on 03/18/14 at [facility] when he weighed 181 lb.   Per notes: - patient was proned 12/29 (first proning); plan to continue proning - COVID PNA with ARDS - SIRS/sepsis   Patient is currently intubated on ventilator support MV: 12.2 L/min Temp (24hrs), Avg:99.1 F (37.3 C), Min:98.24 F (36.8 C), Max:99.5 F (37.5 C) Propofol: 13 ml/hr (343 kcal)  Labs reviewed; CBGs: 345, 281, 255, 282 mg/dl, Na: 354 mmol/l, Cl: 97 mmol/l, Ca: 8 mg/dl, LFTs elevated.  Medications reviewed; 40 mg IV lasix x1 dose 12/29, sliding scale novolog, 5 units novolog every 4 hours, 10 units lantus/day, 40 mg solu-medrol BID, 40 mg IV protonix/day, 100 mg IV remdesivir x1 dose/day x4 days (12/29-1/1). Drips; fentanyl @ 125 mcg/hr, heparin @ 1200 units/hr, propofol @ 30 mcg/kg/min, vec @ 1 mcg/kg/min.     NUTRITION - FOCUSED PHYSICAL EXAM:  completed; no muscle or fat depletions, mild edema to all extremities  Diet Order:   Diet Order    None      EDUCATION NEEDS:   Not appropriate for education at this time  Skin:  Skin Assessment: Reviewed RN Assessment  Last BM:  12/29 (type 5)  Height:   Ht Readings from Last 1 Encounters:  02/22/2020 5\' 4"  (1.626 m)    Weight:   Wt Readings from Last 1  Encounters:  02/22/2020 72.1 kg    Estimated Nutritional Needs:  Kcal:  2165 kcal Protein:  120-130 grams Fluid:  >/= 2.5 L/day      Trenton Gammon, MS, RD, LDN, CNSC Inpatient Clinical Dietitian RD pager # available in AMION  After hours/weekend pager # available in [facility]

## 2020-03-11 NOTE — CV Procedure (Signed)
Procedure: TEE  Sedation: Per CCM  Indication: ECMO cannulation positioning  Findings: Please see echo section for full report.  The left ventricle was normal in size with mild to moderate LV hypertrophy.  EF 50%, mild diffuse hypokinesis.  The RV was moderately dilated with moderate systolic dysfunction.  D-shaped septum suggestive of RV pressure/volume overload.  There was thrombus noted near the RV apex.  Normal right atrial size.  Normal left atrial size.  No LA appendage thrombus.  Trileaflet aortic valve without significant stenosis or regurgitation.  No significant mitral regurgitation or tricuspid regurgitation.    Crescent ECMO cannula noted in RA, the cannula was repositioned to direct outflow across the tricuspid valve.   Alex [name] 02/13/2020 6:52 PM

## 2020-03-11 NOTE — Progress Notes (Signed)
  Echocardiogram 2D Echocardiogram has been performed.  Alex [name] 02/23/2020, 11:10 AM

## 2020-03-11 NOTE — Progress Notes (Signed)
Patient picked up by [facility].  Belongings sent with patient (patient belonging bag and black bookbag with shoes, laptop, and cell phone). Patient's son, [name], notified of transfer and new contact information for [facility] at [facility].  Maintenance fluids and tube feeds held for transfer.  Patient's receiving nurse, Marylene Land, RN made aware.

## 2020-03-11 NOTE — Progress Notes (Signed)
Report called to Marylene Land, RN at [facility]. Medical necessity form completed. Secretary to arrange transport with [facility].

## 2020-03-11 NOTE — Progress Notes (Signed)
Abg obtained on the following vent settings: PRVC mode, VT=362ml, rr35, 100% +18 peep:  Results for [name], Alex [name] (MRN [id number]) as of 03/10/2020 06:33  Ref. Range 02/15/2020 05:35  Delivery systems Unknown VENTILATOR  FIO2 Unknown 100.00  Mode Unknown PRESSURE REGULATED VOLUME CONTROL  VT Latest Units: mL 350  Peep/cpap Latest Units: cm H20 18.0  pH, Arterial Latest Ref Range: 7.350 - 7.450  7.319 (L)  pCO2 arterial Latest Ref Range: 32.0 - 48.0 mmHg 49.9 (H)  pO2, Arterial Latest Ref Range: 83.0 - 108.0 mmHg 55.0 (L)  Acid-base deficit Latest Ref Range: 0.0 - 2.0 mmol/L 1.4  Bicarbonate Latest Ref Range: 20.0 - 28.0 mmol/L 24.9  O2 Saturation Latest Units: % 85.3  Patient temperature Unknown 98.5  Collection site Unknown A-LINE  Allens test (pass/fail) Latest Ref Range: PASS  PASS

## 2020-03-11 NOTE — Progress Notes (Signed)
NAME:  Alex [name], MRN:  [id number], DOB:  [birth year], LOS: 2 ADMISSION DATE:  02/13/2020, CONSULTATION DATE:  03/10/2020  REFERRING MD:  Dr Natale Milch, CHIEF COMPLAINT:  Acute respiratory failure   Brief History:  Admitted w/ COVID 12/28 w/ ARDS   Past Medical History:  History reviewed. No pertinent past medical history.  Significant Hospital Events:  12/28 admitted  Endotracheal intubation 12/29; prone positioning started. Refractory hypoxia  12/30 LE [place] ordered. DDimer > 20, started on IV heparin. ABX continued. ECMO team spoke to Paul Dykes, and [employer].    Consults:  PCCM 12/29  Procedures:  Endotracheal intubation 12/29 Right IJ CVL 12/29>> Left rad aline 12/29>>> Significant Diagnostic Tests:  Chest x-ray reviewed by myself showing multifocal infiltrates  Micro Data:  Coronavirus positive Blood culture 12/28>> Antimicrobials:  Azithromycin 12/29>> Ceftriaxone 12/29>> Remdesivir 12/29>>  Interim History / Subjective:  Still hypoxic  Objective   Blood pressure 98/66, pulse 96, temperature 99.3 F (37.4 C), temperature source Axillary, resp. rate (Abnormal) 35, height 5\' 4"  (1.626 m), weight 72.1 kg, SpO2 (Abnormal) 81 %. CVP:  [8 mmHg-10 mmHg] 9 mmHg  Vent Mode: PRVC FiO2 (%):  [26 %-100 %] 100 % Set Rate:  [15 bmp-35 bmp] 35 bmp Vt Set:  [350 mL] 350 mL PEEP:  [10 cmH20-18 cmH20] 18 cmH20 Plateau Pressure:  [30 cmH20-39 cmH20] 39 cmH20   Intake/Output Summary (Last 24 hours) at 04/05/20 03/13/2020 Last data filed at 04-05-2020 0600 Gross per 24 hour  Intake 2019.2 ml  Output 741 ml  Net 1278.2 ml   Filed Weights   03/04/2020 0556  Weight: 72.1 kg   Examination: General Hen[place] sedated 49 year old black male remains on high PEEP/FiO2 HEENT normocephalic atraumatic no jugular venous distention right IJ unremarkable orally intubated Pulmonary: Diminished bilaterally.  Equal bilaterally.  Current settings on Vent: vt 350/rr 35 peep  18/ fio2 100 pplat 39; sats 81 Card RRR abd soft not tender Ext warm GU clear  Neuro on NMB  Resolved Hospital Problem list     Assessment & Plan:  Acute hypoxic respiratory failure secondary to Covid pneumonia w/ ARDS and what looks like superimposed concurrent bacterial pneumonia pcxr from 12/29 bilateral airspace disease w/ dense bibasilar consolidation Fio2/Peep needs: abg reviewed: P/F ratio: 55 ddimer > 20 Plan Cont full vent support/ARDS protocol Vt 6cc/kg pplat goal <30/driving pressure goal 1/30 Target Pao2 >55 Cont prone positioning for P/F ratio <150 Target sats >88% Day 2 Remdesivir Day 2 of 3 high dose systemic steroids-->then start taper Day 3 ceftriaxone and azithro  VAP bundle PAD protocol w/ RASS goal -4; repeat triglycerides in am; may need to change to versed PRN NMB  Adding empiric anticoagulation and getting LE <67 -->ddimer > 20 and concern for PE fairly high Getting ECHO-->may need to consider TPA (will order ECHO soon as supine) Will also run case by ECMO team  SIRS/Sepsis 2/2 above (COVID and possible CAP) No sig fever, no leukocytosis.  CVP 9 Plan Cont abx Hold further diuresis for now Tele  Keep euvolemic F/u cultures  Hyperglycemia Plan Inc lantus to 10 bid Add TF coverage Cont ssi  Transaminitis Plan Repeat am  We will initiate proning  Best practice (evaluated daily)  Diet: Tube feeds Pain/Anxiety/Delirium protocol (if indicated): Propofol, fentanyl, intermittent paralytic VAP protocol (if indicated): In place DVT prophylaxis: Lovenox-->change to IV heparin 12/30 GI prophylaxis: Protonix Glucose control: SSI; added TF coverage and increased lantus 12/30 Mobility: Bedrest Disposition: ICU going to [facility]  for ECMO  Goals of Care:  Last date of multidisciplinary goals of care discussion: Pending Family and staff present: No Summary of discussion: No Follow up goals of care discussion due: We will update Code Status: Full  code  My cct 43 minutes.   Simonne Martinet ACNP-BC [facility] Pulmonary/Critical Care Pager # [phone] OR # [phone] if no answer

## 2020-03-11 NOTE — Progress Notes (Signed)
Ventilator changes made while pt was in cathlab per Dr. Denese Killings.

## 2020-03-11 NOTE — Progress Notes (Signed)
100 cc of Vec and 200 cc of Fentanyl wasted with Devota Pace.

## 2020-03-11 NOTE — Progress Notes (Signed)
RT assessed with transport from cath lab #6 to 2H23 while on full ventilatory support. Pt tolerated well.

## 2020-03-11 NOTE — Progress Notes (Signed)
[place] for Heparin Indication: presumed PE  No Known Allergies  Patient Measurements: Height: '5\' 4"'  (162.6 cm) Weight: 72.1 kg (159 lb) IBW/kg (Calculated) : 59.2 Heparin Dosing Weight: 72.1 kg  Vital Signs: BP: 93/65 (12/30 0800) Pulse Rate: 96 (12/30 0800)  Labs: Recent Labs    02/22/2020 0830 02/12/2020 1810 02/18/2020 2254 03/10/20 0222 02/18/2020 0518  HGB 15.7  --   --  14.1 14.8  HCT 45.2  --   --  40.2 44.2  PLT 259  --   --  302 206  CREATININE 1.22 1.15 0.95 0.93  --    Estimated Creatinine Clearance: 89.4 mL/min (by C-G formula based on SCr of 0.93 mg/dL).  Medical History: History reviewed. No pertinent past medical history.  Medications:  Scheduled:  . artificial tears  1 application Both Eyes H4L  . chlorhexidine gluconate (MEDLINE KIT)  15 mL Mouth Rinse BID  . Chlorhexidine Gluconate Cloth  6 each Topical Daily  . feeding supplement (PROSource TF)  45 mL Per Tube BID  . feeding supplement (VITAL HIGH PROTEIN)  1,000 mL Per Tube Q24H  . insulin aspart  0-15 Units Subcutaneous Q4H  . insulin aspart  5 Units Subcutaneous Q4H  . insulin glargine  10 Units Subcutaneous BID  . insulin starter kit- pen needles  1 kit Other Once  . Ipratropium-Albuterol  1 puff Inhalation Q6H  . living well with diabetes book   Does not apply Once  . mouth rinse  15 mL Mouth Rinse 10 times per day  . methylPREDNISolone (SOLU-MEDROL) injection  40 mg Intravenous Q12H   Followed by  . [START ON 03/13/2020] predniSONE  50 mg Oral Daily  . pantoprazole (PROTONIX) IV  40 mg Intravenous QHS  . sodium chloride flush  10-40 mL Intracatheter Q12H   Infusions:  . sodium chloride    . azithromycin 500 mg (03/03/2020 1126)  . cefTRIAXone (ROCEPHIN)  IV 1 g (02/13/2020 1033)  . fentaNYL infusion INTRAVENOUS 125 mcg/hr (02/24/2020 0936)  . heparin 1,200 Units/hr (02/17/2020 0939)  . lactated ringers 100 mL/hr at 02/29/2020 1031  . propofol (DIPRIVAN)  infusion 30 mcg/kg/min (03/05/2020 0831)  . remdesivir 100 mg in NS 100 mL 100 mg (03/10/2020 1000)  . vecuronium (NORCURON) infusion 165m/100mL (1 mg/mL) 0.994 mcg/kg/min (03/10/2020 09379    Assessment: Covid positive patient, admit 12/28, intubated 12/29 with high O2 needs. Begin Heparin infusion for presumed PE.  Goal of Therapy:  Heparin level 0.3-0.7 units/ml Monitor for s/s bleed   Plan:   Heparin bolus 3000 units, infusion at 1200 units/hr  Check 1st heparin level 6 hr after start  Daily CBC, plan daily Heparin level at steady state  Alex [name] TRona RavensPharmD 03/08/2020,8:59 AM

## 2020-03-11 NOTE — Progress Notes (Signed)
Lower extremity venous bilateral study completed.  Preliminary results relayed to Denese Killings, MD and Donnal Debar, RN.   See CV Proc for preliminary results report.   Jean Rosenthal, RDMS

## 2020-03-11 NOTE — Consult Note (Signed)
ECMO Consult Note   Called from [facility] for ECMO Consult at (time) 10:30 AM by Zenia Resides, NP Admitting Diagnosis-COVID-19 pneumonia Primary Issue-refractory acute hypoxic respiratory failure Age:49 y.o. Weight: 72 kg (BMI 27.2)  Days on Mechanical Ventilation-1  MAP FiO2 Oxygen Index P/F Ratio   80  1.0  n/a  55    Vasopressors no   MSOF No   RESP score (VV-ECMO) : 6 - 80% chance of survival with ECMO http://www.respscore.com  SAVE score (VA-ECMO) : n/a http://www.save-score.com  Recent Blood Gas:     Component Value Date/Time   PHART 7.319 (L) 03/04/2020 0535   PCO2ART 49.9 (H) 03/01/2020 0535   PO2ART 55.0 (L) 02/21/2020 0535   HCO3 24.9 03/04/2020 0535   ACIDBASEDEF 1.4 03/12/2020 0535   O2SAT 85.3 03/03/2020 0535    Coags:    Component Value Date/Time   FIBRINOGEN >800 (H) 03/03/2020 0830    CBC    Component Value Date/Time   WBC 5.0 03/05/2020 0518   RBC 5.13 02/13/2020 0518   HGB 14.8 03/10/2020 0518   HCT 44.2 02/13/2020 0518   PLT 206 02/27/2020 0518   MCV 86.2 02/24/2020 0518   MCH 28.8 02/12/2020 0518   MCHC 33.5 03/10/2020 0518   RDW 12.9 02/13/2020 0518   LYMPHSABS 0.4 (L) 03/07/2020 0518   MONOABS 0.2 03/04/2020 0518   EOSABS 0.0 02/12/2020 0518   BASOSABS 0.0 02/27/2020 0518    BMET    Component Value Date/Time   NA 132 (L) 03/10/2020 0222   K 3.8 03/10/2020 0222   CL 97 (L) 03/10/2020 0222   CO2 25 03/10/2020 0222   GLUCOSE 188 (H) 03/10/2020 0222   BUN 16 03/10/2020 0222   CREATININE 0.93 03/10/2020 0222   CALCIUM 8.0 (L) 03/10/2020 0222   GFRNONAA >60 03/10/2020 0222                                                                                                                                                             ECMO physician Sarayah Bacchi notified at 10:30 (time) Candidate meets ECMO Criteria- Yes  Accepted in transfer at 10:50   Alex [name] is an 49 y.o. male.  HPI: Admitted to hospital on 12/28 with a 1 week  history of dyspnea, cough, nausea and vomiting.  Came to hospital because of inability to breathe.  Tested Covid positive.  Was started on steroids.  Rapidly deteriorated and required intubation 12/29.  Severe ARDS with persistent hypoxemia and low PF ratio in spite of of 18 of PEEP and 1.0.  No improvement/did not tolerate prone ventilation. ECMO team consulted 12/30 for potential candidacy for VV ECMO.  History reviewed. No pertinent past medical history.  Past Surgical History:  Procedure Laterality Date  . Wisdon teeth      Family  History  Problem Relation Age of Onset  . Heart disease Father   . Heart disease Mother   . Breast cancer Mother     Social History:  reports that he has never smoked. He has never used smokeless tobacco. He reports current alcohol use. He reports that he does not use drugs.  Allergies: No Known Allergies  Medications: I have reviewed the patient's current medications.  Results for orders placed or performed during the hospital encounter of 02/29/2020 (from the past 48 hour(s))  Glucose, capillary     Status: Abnormal   Collection Time: 03/04/2020  4:49 PM  Result Value Ref Range   Glucose-Capillary 401 (H) 70 - 99 mg/dL    Comment: Glucose reference range applies only to samples taken after fasting for at least 8 hours.  Comprehensive metabolic panel     Status: Abnormal   Collection Time: 02/25/2020  6:10 PM  Result Value Ref Range   Sodium 128 (L) 135 - 145 mmol/L   Potassium 3.9 3.5 - 5.1 mmol/L   Chloride 91 (L) 98 - 111 mmol/L   CO2 20 (L) 22 - 32 mmol/L   Glucose, Bld 365 (H) 70 - 99 mg/dL    Comment: Glucose reference range applies only to samples taken after fasting for at least 8 hours.   BUN 15 6 - 20 mg/dL   Creatinine, Ser 7.82 0.61 - 1.24 mg/dL   Calcium 8.1 (L) 8.9 - 10.3 mg/dL   Total Protein 7.5 6.5 - 8.1 g/dL   Albumin 3.4 (L) 3.5 - 5.0 g/dL   AST 96 (H) 15 - 41 U/L   ALT 70 (H) 0 - 44 U/L   Alkaline Phosphatase 94 38 - 126 U/L    Total Bilirubin 1.3 (H) 0.3 - 1.2 mg/dL   GFR, Estimated >95 >62 mL/min    Comment: (NOTE) Calculated using the CKD-EPI Creatinine Equation (2021)    Anion gap 17 (H) 5 - 15    Comment: Performed at [facility], [address], [place], [place] [address]  Blood gas, venous     Status: Abnormal   Collection Time: 03/12/2020  6:10 PM  Result Value Ref Range   pH, Ven 7.323 7.250 - 7.430   pCO2, Ven 42.0 (L) 44.0 - 60.0 mmHg   pO2, Ven 39.3 32.0 - 45.0 mmHg   Bicarbonate 21.2 20.0 - 28.0 mmol/L   Acid-base deficit 4.2 (H) 0.0 - 2.0 mmol/L   O2 Saturation 67.3 %   Patient temperature 98.6     Comment: Performed at [facility], [address] [place], [place] [address]  Osmolality     Status: None   Collection Time: 03/04/2020  6:10 PM  Result Value Ref Range   Osmolality 291 275 - 295 mOsm/kg    Comment: PERFORMED AT [facility] Performed at [facility], [address], [place], [place] [address]   Hemoglobin A1c     Status: Abnormal   Collection Time: 02/24/2020  6:10 PM  Result Value Ref Range   Hgb A1c MFr Bld 11.8 (H) 4.8 - 5.6 %    Comment: (NOTE) Pre diabetes:          5.7%-6.4%  Diabetes:              >6.4%  Glycemic control for   <7.0% adults with diabetes    Mean Plasma Glucose 291.96 mg/dL    Comment: Performed at [facility], [address], [place], [place] [address]  MRSA PCR Screening     Status: None   Collection Time: 02/23/2020  7:39 PM   Specimen: Nasopharyngeal  Result Value Ref Range   MRSA by PCR NEGATIVE NEGATIVE    Comment:        The GeneXpert MRSA Assay (FDA approved for NASAL specimens only), is one component of a comprehensive MRSA colonization surveillance program. It is not intended to diagnose MRSA infection nor to guide or monitor treatment for MRSA infections. Performed at [facility] [address] [place], [place] [address]   Glucose, capillary      Status: Abnormal   Collection Time: 02/11/2020  7:51 PM  Result Value Ref Range   Glucose-Capillary 331 (H) 70 - 99 mg/dL    Comment: Glucose reference range applies only to samples taken after fasting for at least 8 hours.  Glucose, capillary     Status: Abnormal   Collection Time: 03/05/2020  9:06 PM  Result Value Ref Range   Glucose-Capillary 206 (H) 70 - 99 mg/dL    Comment: Glucose reference range applies only to samples taken after fasting for at least 8 hours.   Comment 1 Document in Chart   Glucose, capillary     Status: Abnormal   Collection Time: 03/04/2020 10:17 PM  Result Value Ref Range   Glucose-Capillary 202 (H) 70 - 99 mg/dL    Comment: Glucose reference range applies only to samples taken after fasting for at least 8 hours.  Basic metabolic panel     Status: Abnormal   Collection Time: 02/17/2020 10:54 PM  Result Value Ref Range   Sodium 128 (L) 135 - 145 mmol/L   Potassium 4.1 3.5 - 5.1 mmol/L   Chloride 94 (L) 98 - 111 mmol/L   CO2 22 22 - 32 mmol/L   Glucose, Bld 209 (H) 70 - 99 mg/dL    Comment: Glucose reference range applies only to samples taken after fasting for at least 8 hours.   BUN 16 6 - 20 mg/dL   Creatinine, Ser 0.98 0.61 - 1.24 mg/dL   Calcium 7.9 (L) 8.9 - 10.3 mg/dL   GFR, Estimated >11 >91 mL/min    Comment: (NOTE) Calculated using the CKD-EPI Creatinine Equation (2021)    Anion gap 12 5 - 15    Comment: Performed at [facility], [address], [place], [place] [address]  Glucose, capillary     Status: Abnormal   Collection Time: 03/07/2020 11:19 PM  Result Value Ref Range   Glucose-Capillary 209 (H) 70 - 99 mg/dL    Comment: Glucose reference range applies only to samples taken after fasting for at least 8 hours.  Glucose, capillary     Status: Abnormal   Collection Time: 03/10/20 12:20 AM  Result Value Ref Range   Glucose-Capillary 192 (H) 70 - 99 mg/dL    Comment: Glucose reference range applies only to samples taken after  fasting for at least 8 hours.   Comment 1 Document in Chart   Glucose, capillary     Status: Abnormal   Collection Time: 03/10/20  1:13 AM  Result Value Ref Range   Glucose-Capillary 178 (H) 70 - 99 mg/dL    Comment: Glucose reference range applies only to samples taken after fasting for at least 8 hours.  Glucose, capillary     Status: Abnormal   Collection Time: 03/10/20  2:10 AM  Result Value Ref Range   Glucose-Capillary 197 (H) 70 -  99 mg/dL    Comment: Glucose reference range applies only to samples taken after fasting for at least 8 hours.   Comment 1 Document in Chart   HIV Antibody (routine testing w rflx)     Status: None   Collection Time: 03/10/20  2:22 AM  Result Value Ref Range   HIV Screen 4th Generation wRfx Non Reactive Non Reactive    Comment: Performed at [facility], [address] [place], [place] [address]  CBC with Differential/Platelet     Status: Abnormal   Collection Time: 03/10/20  2:22 AM  Result Value Ref Range   WBC 6.4 4.0 - 10.5 K/uL   RBC 4.83 4.22 - 5.81 MIL/uL   Hemoglobin 14.1 13.0 - 17.0 g/dL   HCT 88.4 16.6 - 06.3 %   MCV 83.2 80.0 - 100.0 fL   MCH 29.2 26.0 - 34.0 pg   MCHC 35.1 30.0 - 36.0 g/dL   RDW 01.6 01.0 - 93.2 %   Platelets 302 150 - 400 K/uL   nRBC 0.0 0.0 - 0.2 %   Neutrophils Relative % 93 %   Neutro Abs 5.9 1.7 - 7.7 K/uL   Lymphocytes Relative 6 %   Lymphs Abs 0.4 (L) 0.7 - 4.0 K/uL   Monocytes Relative 1 %   Monocytes Absolute 0.1 0.1 - 1.0 K/uL   Eosinophils Relative 0 %   Eosinophils Absolute 0.0 0.0 - 0.5 K/uL   Basophils Relative 0 %   Basophils Absolute 0.0 0.0 - 0.1 K/uL   WBC Morphology INCREASED BANDS (>20% BANDS)     Comment: MILD LEFT SHIFT (1-5% METAS, OCC MYELO, OCC BANDS)   Immature Granulocytes 0 %   Abs Immature Granulocytes 0.01 0.00 - 0.07 K/uL    Comment: Performed at [facility], [address], [place], [place] [address]  C-reactive protein     Status: Abnormal    Collection Time: 03/10/20  2:22 AM  Result Value Ref Range   CRP 31.9 (H) <1.0 mg/dL    Comment: Performed at [facility], [address] [place] [address], [place], [place] [address]  D-dimer, quantitative (not at [facility])     Status: Abnormal   Collection Time: 03/10/20  2:22 AM  Result Value Ref Range   D-Dimer, Quant 3.14 (H) 0.00 - 0.50 ug/mL-FEU    Comment: (NOTE) At the manufacturer cut-off value of 0.5 g/mL FEU, this assay has a negative predictive value of 95-100%.This assay is intended for use in conjunction with a clinical pretest probability (PTP) assessment model to exclude pulmonary embolism (PE) and deep venous thrombosis (DVT) in outpatients suspected of PE or DVT. Results should be correlated with clinical presentation. Performed at [facility], [address] [place], [place] [address]   Comprehensive metabolic panel     Status: Abnormal   Collection Time: 03/10/20  2:22 AM  Result Value Ref Range   Sodium 132 (L) 135 - 145 mmol/L   Potassium 3.8 3.5 - 5.1 mmol/L   Chloride 97 (L) 98 - 111 mmol/L   CO2 25 22 - 32 mmol/L   Glucose, Bld 188 (H) 70 - 99 mg/dL    Comment: Glucose reference range applies only to samples taken after fasting for at least 8 hours.   BUN 16 6 - 20 mg/dL   Creatinine, Ser 2.37 0.61 - 1.24 mg/dL   Calcium 8.0 (L) 8.9 - 10.3 mg/dL   Total Protein 6.4 (L) 6.5 - 8.1 g/dL  Albumin 3.0 (L) 3.5 - 5.0 g/dL   AST 696 (H) 15 - 41 U/L   ALT 76 (H) 0 - 44 U/L   Alkaline Phosphatase 92 38 - 126 U/L   Total Bilirubin 0.7 0.3 - 1.2 mg/dL   GFR, Estimated >29 >52 mL/min    Comment: (NOTE) Calculated using the CKD-EPI Creatinine Equation (2021)    Anion gap 10 5 - 15    Comment: Performed at [facility], [address], [place], [place] [address]  Ferritin     Status: Abnormal   Collection Time: 03/10/20  2:22 AM  Result Value Ref Range   Ferritin 1,095 (H) 24 - 336 ng/mL    Comment: Performed at [facility], [address] [place], [place] [address]  Magnesium     Status: Abnormal   Collection Time: 03/10/20  2:22 AM  Result Value Ref Range   Magnesium 2.8 (H) 1.7 - 2.4 mg/dL    Comment: Performed at [facility], [address] [place], [place] [address]  Glucose, capillary     Status: Abnormal   Collection Time: 03/10/20  3:13 AM  Result Value Ref Range   Glucose-Capillary 163 (H) 70 - 99 mg/dL    Comment: Glucose reference range applies only to samples taken after fasting for at least 8 hours.  Glucose, capillary     Status: Abnormal   Collection Time: 03/10/20  4:52 AM  Result Value Ref Range   Glucose-Capillary 177 (H) 70 - 99 mg/dL    Comment: Glucose reference range applies only to samples taken after fasting for at least 8 hours.   Comment 1 Document in Chart   Glucose, capillary     Status: Abnormal   Collection Time: 03/10/20  6:35 AM  Result Value Ref Range   Glucose-Capillary 180 (H) 70 - 99 mg/dL    Comment: Glucose reference range applies only to samples taken after fasting for at least 8 hours.   Comment 1 Document in Chart   Glucose, capillary     Status: Abnormal   Collection Time: 03/10/20  8:07 AM  Result Value Ref Range   Glucose-Capillary 191 (H) 70 - 99 mg/dL    Comment: Glucose reference range applies only to samples taken after fasting for at least 8 hours.  Glucose, capillary     Status: Abnormal   Collection Time: 03/10/20 12:52 PM  Result Value Ref Range   Glucose-Capillary 306 (H) 70 - 99 mg/dL    Comment: Glucose reference range applies only to samples taken after fasting for at least 8 hours.  Blood gas, arterial     Status: Abnormal   Collection Time: 03/10/20  4:01 PM  Result Value Ref Range   FIO2 100.00    O2 Content 100.0 L/min   VT 350 mL   Peep/cpap 16.0 cm H20   pH, Arterial 7.200 (L) 7.350 - 7.450   pCO2 arterial 66.3 (HH) 32.0 - 48.0 mmHg    Comment: CRITICAL RESULT CALLED TO, READ BACK BY AND  VERIFIED WITH: GARCIA,K. RN  ON 12.29.2021 BY COHEN,K    pO2, Arterial 47.5 (L) 83.0 - 108.0 mmHg   Bicarbonate 24.9 20.0 - 28.0 mmol/L   Acid-base deficit 4.6 (H) 0.0 - 2.0 mmol/L   O2 Saturation 72.0 %   Patient temperature 98.6    Collection site A-LINE    Sample type ARTERIAL    Allens test (pass/fail) PASS PASS    Comment: Performed at  [facility], [address] [place], [place] [address]  Glucose, capillary     Status: Abnormal   Collection Time: 03/10/20  4:46 PM  Result Value Ref Range   Glucose-Capillary 297 (H) 70 - 99 mg/dL    Comment: Glucose reference range applies only to samples taken after fasting for at least 8 hours.  Magnesium     Status: Abnormal   Collection Time: 03/10/20  6:49 PM  Result Value Ref Range   Magnesium 2.8 (H) 1.7 - 2.4 mg/dL    Comment: Performed at [facility], [address], [place], [place] [address]  Phosphorus     Status: None   Collection Time: 03/10/20  6:49 PM  Result Value Ref Range   Phosphorus 4.2 2.5 - 4.6 mg/dL    Comment: Performed at [facility], [address] [place], [place] [address]  Glucose, capillary     Status: Abnormal   Collection Time: 03/10/20  7:38 PM  Result Value Ref Range   Glucose-Capillary 297 (H) 70 - 99 mg/dL    Comment: Glucose reference range applies only to samples taken after fasting for at least 8 hours.  Glucose, capillary     Status: Abnormal   Collection Time: 02/26/2020 12:21 AM  Result Value Ref Range   Glucose-Capillary 345 (H) 70 - 99 mg/dL    Comment: Glucose reference range applies only to samples taken after fasting for at least 8 hours.  Glucose, capillary     Status: Abnormal   Collection Time: 03/12/2020  5:00 AM  Result Value Ref Range   Glucose-Capillary 281 (H) 70 - 99 mg/dL    Comment: Glucose reference range applies only to samples taken after fasting for at least 8 hours.  CBC with Differential/Platelet     Status:  Abnormal   Collection Time: 02/15/2020  5:18 AM  Result Value Ref Range   WBC 5.0 4.0 - 10.5 K/uL   RBC 5.13 4.22 - 5.81 MIL/uL   Hemoglobin 14.8 13.0 - 17.0 g/dL   HCT 91.4 78.2 - 95.6 %   MCV 86.2 80.0 - 100.0 fL   MCH 28.8 26.0 - 34.0 pg   MCHC 33.5 30.0 - 36.0 g/dL   RDW 21.3 08.6 - 57.8 %   Platelets 206 150 - 400 K/uL   nRBC 0.0 0.0 - 0.2 %   Neutrophils Relative % 88 %   Neutro Abs 4.4 1.7 - 7.7 K/uL   Lymphocytes Relative 8 %   Lymphs Abs 0.4 (L) 0.7 - 4.0 K/uL   Monocytes Relative 3 %   Monocytes Absolute 0.2 0.1 - 1.0 K/uL   Eosinophils Relative 0 %   Eosinophils Absolute 0.0 0.0 - 0.5 K/uL   Basophils Relative 0 %   Basophils Absolute 0.0 0.0 - 0.1 K/uL   WBC Morphology INCREASED BANDS (>20% BANDS)    Immature Granulocytes 1 %   Abs Immature Granulocytes 0.04 0.00 - 0.07 K/uL   Reactive, Benign Lymphocytes PRESENT     Comment: Performed at [facility], [address], [place], [place] [address]  C-reactive protein     Status: Abnormal   Collection Time: 02/14/2020  5:18 AM  Result Value Ref Range   CRP 20.6 (H) <1.0 mg/dL    Comment: Performed at [facility], [address] [place], [place] [address]  D-dimer, quantitative (not at [facility])     Status: Abnormal   Collection Time: 02/29/2020  5:18 AM  Result Value Ref Range   D-Dimer, Quant >20.00 (H) 0.00 - 0.50 ug/mL-FEU    Comment: (NOTE) At the manufacturer cut-off value of 0.5 g/mL FEU, this assay has a negative predictive value of 95-100%.This assay is intended for use in conjunction with a clinical pretest probability (PTP) assessment model to exclude pulmonary embolism (PE) and deep venous thrombosis (DVT) in outpatients suspected of PE or DVT. Results should be correlated with clinical presentation. Performed at [facility], [address] [place], [place] [address]   Ferritin     Status: Abnormal   Collection Time: January 08, 2020  5:18 AM  Result Value  Ref Range   Ferritin 1,214 (H) 24 - 336 ng/mL    Comment: Performed at [facility], [address] [place], [place] [address]  Magnesium     Status: Abnormal   Collection Time: January 08, 2020  5:18 AM  Result Value Ref Range   Magnesium 3.3 (H) 1.7 - 2.4 mg/dL    Comment: Performed at [facility], [address] [place], [place] [address]  Triglycerides     Status: Abnormal   Collection Time: January 08, 2020  5:18 AM  Result Value Ref Range   Triglycerides 496 (H) <150 mg/dL    Comment: Performed at [facility], [address] [place], [place] [address]  Phosphorus     Status: None   Collection Time: January 08, 2020  5:18 AM  Result Value Ref Range   Phosphorus 4.3 2.5 - 4.6 mg/dL    Comment: Performed at [facility], [address] [place], [place] [address]  Blood gas, arterial     Status: Abnormal   Collection Time: January 08, 2020  5:35 AM  Result Value Ref Range   FIO2 100.00    Delivery systems VENTILATOR    Mode PRESSURE REGULATED VOLUME CONTROL    VT 350 mL   LHR 35 resp/min   Peep/cpap 18.0 cm H20   pH, Arterial 7.319 (L) 7.350 - 7.450   pCO2 arterial 49.9 (H) 32.0 - 48.0 mmHg   pO2, Arterial 55.0 (L) 83.0 - 108.0 mmHg   Bicarbonate 24.9 20.0 - 28.0 mmol/L   Acid-base deficit 1.4 0.0 - 2.0 mmol/L   O2 Saturation 85.3 %   Patient temperature 98.5    Collection site A-LINE    Drawn by [id number]    Allens test (pass/fail) PASS PASS    Comment: Performed at [facility], [address] [place], [place] [address]  Glucose, capillary     Status: Abnormal   Collection Time: January 08, 2020  8:20 AM  Result Value Ref Range   Glucose-Capillary 255 (H) 70 - 99 mg/dL    Comment: Glucose reference range applies only to samples taken after fasting for at least 8 hours.    DG Abd 1 View  Result Date: 03/10/2020 CLINICAL DATA:  OG tube placement EXAM: ABDOMEN - 1 VIEW COMPARISON:  None. FINDINGS: An enteric  tube is present with tip in the right upper quadrant consistent with location in the distal stomach or possibly proximal duodenum. Visualized bowel are decompressed. Consolidation or infiltration suggested in the lung bases. IMPRESSION: Enteric tube tip in the right upper quadrant consistent with location in the distal stomach or possibly proximal duodenum. Electronically Signed   By: Burman NievesWilliam  Stevens M.D.   On: 03/10/2020 16:08   DG Chest Port 1 View  Result Date: 03/10/2020 CLINICAL DATA:  COVID-19 positive, pronation EXAM: PORTABLE CHEST 1 VIEW COMPARISON:  03/10/2020 FINDINGS:  Portable prone radiograph was performed. Endotracheal tube overlies tracheal air column tip at thoracic inlet. Enteric catheter passes below diaphragm tip excluded by collimation. Right internal jugular catheter tip projects over superior vena cava. The cardiac silhouette is obscured by dense bibasilar consolidation, with minimal progression since prior study. No evidence of pneumothorax. No pleural effusion. IMPRESSION: 1. Dense bibasilar consolidation consistent with pneumonia. 2. Stable support devices. Electronically Signed   By: Sharlet Salina M.D.   On: 03/10/2020 18:33   DG CHEST PORT 1 VIEW  Result Date: 03/10/2020 CLINICAL DATA:  Endotracheal and central line placements EXAM: PORTABLE CHEST 1 VIEW COMPARISON:  02/29/2020 FINDINGS: An endotracheal tube has been placed with tip measuring 4.5 cm above the carina. A right central venous catheter has been placed with tip projected over the cavoatrial junction region. No pneumothorax. Bilateral perihilar and basilar lung consolidation, likely multifocal pneumonia or possibly edema. Pulmonary infiltrates are progressing since previous study. No obvious pleural effusions. Heart size is obscured. IMPRESSION: Appliances appear in satisfactory location. Increasing bilateral perihilar and basilar lung consolidation. Electronically Signed   By: Burman Nieves M.D.   On: 03/10/2020  16:10    Review of Systems  Unable to perform ROS: Intubated   Blood pressure 93/65, pulse 96, temperature 99.3 F (37.4 C), temperature source Axillary, resp. rate (!) 35, height 5\' 4"  (1.626 m), weight 72.1 kg, SpO2 (!) 83 %. Physical Exam Constitutional:      Interventions: He is sedated, chemically paralyzed and intubated.  Eyes:     Conjunctiva/sclera: Conjunctivae normal.  Neck:     Comments: Orally intubated, OGT Cardiovascular:     Rate and Rhythm: Normal rate and regular rhythm.     Heart sounds: Normal heart sounds.  Pulmonary:     Effort: He is intubated.     Breath sounds: Decreased air movement present. Decreased breath sounds and rales present.  Genitourinary:    Comments: Foley catheter in place.  Neurological:     Mental Status: He is unresponsive.     GCS: GCS eye subscore is 1. GCS verbal subscore is 1. GCS motor subscore is 1.     Assessment/Plan:  Critically ill due to acute hypoxic and hypercarbic respiratory failure due to severe ARDS from COVID-19 pneumonia. Acute cor pulmonale with severe RV. Acute bilateral lower extremity DVT with clot in transit on TEE  Plan:  Cannulate for VV ECMO.  RV function should improve VV ECMO support. Systemic anticoagulation  Daily Goals Checklist  Pain/Anxiety/Delirium protocol (if indicated): Fentanyl and propofol VAP protocol (if indicated): Bundle in place Respiratory support goals: Rest ventilator settings respiratory rate 20 10/10 40% Blood pressure target: On no vasopressor support.  Likely volume seeking at this time DVT prophylaxis: Systemic bivalirudin Nutrition Status: High nutritional risk initiate tube feed GI prophylaxis: Pantoprazole Fluid status goals: Continue to give fluids Urinary catheter: Assessment of intravascular volume Central lines: Subclavian triple-lumen.  Right IJ crescent can Glucose control: Phase 1 glycemic protocol Mobility/therapy needs: Bedrest Antibiotic de-escalation:  Continue corticosteroids and initiate biologic. Home medication reconciliation: On hold Daily labs: Per ECMO Code Status: Full Family Communication: Son his mother advised at time of consent Disposition: ICU   CRITICAL CARE Performed by:   Total critical care time: 120 minutes  Critical care time was exclusive of separately billable procedures and treating other patients.  Critical care was necessary to treat or prevent imminent or life-threatening deterioration.  Critical care was time spent personally by me on the following activities: development  of treatment plan with patient and/or surrogate as well as nursing, discussions with consultants, evaluation of patient's response to treatment, examination of patient, obtaining history from patient or surrogate, ordering and performing treatments and interventions, ordering and review of laboratory studies, ordering and review of radiographic studies, pulse oximetry, re-evaluation of patient's condition and participation in multidisciplinary rounds.  Lynnell Catalan, MD [facility] ICU Physician [facility]  Pager: [phone] Mobile: [phone] After hours: [phone].     Lynnell Catalan Date: 03/07/2020 Time: 10:33 AM

## 2020-03-11 NOTE — Consult Note (Signed)
Advanced Heart Failure Team Consult Note   Primary Physician: Patient, No Pcp Per PCP-Cardiologist:  No primary care provider on file.  Reason for Consultation: ECMO cannulation  HPI:    Alex [name] is seen today for evaluation of ECMO cannulation at the request of Dr. Doyne Keel.   49 y.o. with minimal past medical history was admitted on 12/28 with COVID-19 PNA, bilateral airspace disease on CXR.  He was intubated on 12/29 and proned.  He was started on steroids, remdesivir, ceftriaxone/azithromycin.  On 12/30, he was noted to have right common femoral vein DVT.  Echo showed normal LV systolic function EF 09% but the RV was noted to be moderately dilated and dysfunctional with D-shaped septum and PASP 51 mmH, IVC dilated.  Concern for PE, heparin begun.   With refractory hypoxemia, decision was made to cannulate for VV ECMO.  Patient was successfully cannulated via right IJ with Crescent catheter.  TEE was used to guide position, thrombus was noted in the RV.  Left subclavian CVL was placed, complicated by left PTX and patient had chest tube placed.   Review of Systems: Intubated, unable to obtain  Home Medications Prior to Admission medications   Medication Sig Start Date End Date Taking? Authorizing Provider  acetaminophen (TYLENOL) 500 MG tablet Take 1,000 mg by mouth every 6 (six) hours as needed for mild pain.   Yes [provider]  Ascorbic Acid (VITAMIN C) 100 MG tablet Take by mouth daily.   Yes [provider]  guaiFENesin (MUCINEX) 600 MG 12 hr tablet Take 600 mg by mouth 2 (two) times daily as needed for cough or to loosen phlegm.   Yes [provider]  omeprazole (PRILOSEC) 40 MG capsule Take 40 mg by mouth daily as needed (heartburn).   Yes [provider]  ondansetron (ZOFRAN ODT) 4 MG disintegrating tablet Take 1 tablet (4 mg total) by mouth every 8 (eight) hours as needed for nausea or vomiting. 03/08/20   Lucrezia Starch, MD   Probiotic Product (RESTORA) CAPS Take 1 capsule by mouth daily. Patient not taking: Reported on 03/10/2020 08/01/12   Pyrtle, Lajuan Lines, MD    Past Medical History: History reviewed. No pertinent past medical history.  Past Surgical History: Past Surgical History:  Procedure Laterality Date  . Wisd[place] teeth      Family History: Family History  Problem Relation Age of Onset  . Heart disease Father   . Heart disease Mother   . Breast cancer Mother     Social History: Social History   Socioeconomic History  . Marital status: Single    Spouse name: Not on file  . Number of children: Not on file  . Years of education: Not on file  . Highest education level: Not on file  Occupational History  . Not on file  Tobacco Use  . Smoking status: Never Smoker  . Smokeless tobacco: Never Used  Substance and Sexual Activity  . Alcohol use: Yes    Comment: occasionally  . Drug use: Never  . Sexual activity: Not Currently  Other Topics Concern  . Not on file  Social History Narrative  . Not on file   Social Determinants of Health   Financial Resource Strain: Not on file  Food Insecurity: Not on file  Transportation Needs: Not on file  Physical Activity: Not on file  Stress: Not on file  Social Connections: Not on file    Allergies:  No Known Allergies  Objective:  Vital Signs:   Temp:  [98.24 F (36.8 C)-99.5 F (37.5 C)] 99.5 F (37.5 C) (12/30 1400) Pulse Rate:  [0-295] 118 (12/30 1852) Resp:  [0-76] 0 (12/30 1852) BP: (61-140)/(36-90) 105/80 (12/30 1847) SpO2:  [0 %-100 %] 0 % (12/30 1852) Arterial Line BP: (88-132)/(63-82) 107/68 (12/30 1400) FiO2 (%):  [100 %] 100 % (12/30 1200) Last BM Date: 03/10/20  Weight change: Filed Weights   03/08/2020 0556  Weight: 72.1 kg    Intake/Output:   Intake/Output Summary (Last 24 hours) at 02/26/2020 1927 Last data filed at 03/10/2020 1456 Gross per 24 hour  Intake 1629 ml  Output 1065 ml  Net 564 ml       Physical Exam    General:  Well appearing. No resp difficulty HEENT: normal Neck: supple. JVP . Carotids 2+ bilat; no bruits. No lymphadenopathy or thyromegaly appreciated. Cor: PMI nondisplaced. Regular rate & rhythm. No rubs, gallops or murmurs. Lungs: clear Abdomen: soft, nontender, nondistended. No hepatosplenomegaly. No bruits or masses. Good bowel sounds. Extremities: no cyanosis, clubbing, rash, edema Neuro: alert & orientedx3, cranial nerves grossly intact. moves all 4 extremities w/o difficulty. Affect pleasant   Telemetry   NSR 70s (personally reviewed)   Labs   Basic Metabolic Panel: Recent Labs  Lab 02/20/2020 0830 02/29/2020 1810 02/19/2020 2254 03/10/20 0222 03/10/20 1849 02/14/2020 0518  NA 125* 128* 128* 132*  --   --   K 4.3 3.9 4.1 3.8  --   --   CL 84* 91* 94* 97*  --   --   CO2 21* 20* 22 25  --   --   GLUCOSE 335* 365* 209* 188*  --   --   BUN '13 15 16 16  ' --   --   CREATININE 1.22 1.15 0.95 0.93  --   --   CALCIUM 8.8* 8.1* 7.9* 8.0*  --   --   MG  --   --   --  2.8* 2.8* 3.3*  PHOS  --   --   --   --  4.2 4.3    Liver Function Tests: Recent Labs  Lab 03/07/2020 0830 02/25/2020 1810 03/10/20 0222  AST 76* 96* 117*  ALT 66* 70* 76*  ALKPHOS 84 94 92  BILITOT 1.3* 1.3* 0.7  PROT 7.7 7.5 6.4*  ALBUMIN 3.8 3.4* 3.0*   No results for input(s): LIPASE, AMYLASE in the last 168 hours. No results for input(s): AMMONIA in the last 168 hours.  CBC: Recent Labs  Lab 03/05/2020 0830 03/10/20 0222 02/26/2020 0518  WBC 6.3 6.4 5.0  NEUTROABS 5.9 5.9 4.4  HGB 15.7 14.1 14.8  HCT 45.2 40.2 44.2  MCV 83.7 83.2 86.2  PLT 259 302 206    Cardiac Enzymes: No results for input(s): CKTOTAL, CKMB, CKMBINDEX, TROPONINI in the last 168 hours.  BNP: BNP (last 3 results) No results for input(s): BNP in the last 8760 hours.  ProBNP (last 3 results) No results for input(s): PROBNP in the last 8760 hours.   CBG: Recent Labs  Lab 03/10/20 1938  03/06/2020 0021 03/01/2020 0500 02/13/2020 0820 02/22/2020 1147  GLUCAP 297* 345* 281* 255* 282*    Coagulation Studies: No results for input(s): LABPROT, INR in the last 72 hours.   Imaging   CARDIAC CATHETERIZATION  Result Date: 03/05/2020 Successful VV ECMO cannulation.  Patient will start on bivalirudin.   VAS [place] LOWER EXTREMITY VENOUS (DVT)  Result Date: 02/19/2020  Lower Venous DVT Study Indications: D-dimer >20, Covid-19,  concern for PE.  Anticoagulation: Heparin. Comparison Study: No prior studies. Performing Technologist: Darlin Coco RDMS  Examination Guidelines: A complete evaluation includes B-mode imaging, spectral Doppler, color Doppler, and power Doppler as needed of all accessible portions of each vessel. Bilateral testing is considered an integral part of a complete examination. Limited examinations for reoccurring indications may be performed as noted. The reflux portion of the exam is performed with the patient in reverse Trendelenburg.  +---------+---------------+---------+-----------+----------+--------------+ RIGHT    CompressibilityPhasicitySpontaneityPropertiesThrombus Aging +---------+---------------+---------+-----------+----------+--------------+ CFV      Partial        Yes      Yes                  Acute          +---------+---------------+---------+-----------+----------+--------------+ SFJ      Full                                                        +---------+---------------+---------+-----------+----------+--------------+ FV Prox                 Yes      Yes                                 +---------+---------------+---------+-----------+----------+--------------+ FV Mid                  Yes      Yes                                 +---------+---------------+---------+-----------+----------+--------------+ FV Distal               Yes      Yes                                  +---------+---------------+---------+-----------+----------+--------------+ PFV                     Yes      Yes                                 +---------+---------------+---------+-----------+----------+--------------+ POP                     Yes      Yes                                 +---------+---------------+---------+-----------+----------+--------------+ PTV                     Yes      Yes                                 +---------+---------------+---------+-----------+----------+--------------+ PERO                    Yes      Yes                                 +---------+---------------+---------+-----------+----------+--------------+   +---------+---------------+---------+-----------+----------+-------------------+  LEFT     CompressibilityPhasicitySpontaneityPropertiesThrombus Aging      +---------+---------------+---------+-----------+----------+-------------------+ CFV      Full           Yes      Yes                                      +---------+---------------+---------+-----------+----------+-------------------+ SFJ      Full                                                             +---------+---------------+---------+-----------+----------+-------------------+ FV Prox  Full                                                             +---------+---------------+---------+-----------+----------+-------------------+ FV Mid   Full                                                             +---------+---------------+---------+-----------+----------+-------------------+ FV DistalPartial        Yes      Yes                                      +---------+---------------+---------+-----------+----------+-------------------+ PFV      Full                                                             +---------+---------------+---------+-----------+----------+-------------------+ POP                     Yes      Yes                                       +---------+---------------+---------+-----------+----------+-------------------+ PTV                     Yes      Yes                                      +---------+---------------+---------+-----------+----------+-------------------+ PERO                                                  Not well visualized +---------+---------------+---------+-----------+----------+-------------------+     Summary: RIGHT: - Findings consistent with partial, acute deep vein thrombosis involving the  right common femoral vein at the level of the saphenofemoral junction. - No cystic structure found in the popliteal fossa.  LEFT: - Findings consistent with partial, acute deep vein thrombosis involving the distal left femoral vein. - No cystic structure found in the popliteal fossa.  *See table(s) above for measurements and observations.    Preliminary    ECHOCARDIOGRAM LIMITED  Result Date: 02/23/2020    ECHOCARDIOGRAM LIMITED REPORT   Patient Name:   Alex [name] Date of Exam: 02/19/2020 Medical Rec #:  [id number]      Height:       64.0 in Accession #:    [id number]     Weight:       159.0 lb Date of Birth:  [birth year]      BSA:          1.775 m Patient Age:    68 years       BP:           98/66 mmHg Patient Gender: M              HR:           94 bpm. Exam Location:  Inpatient Procedure: Limited Echo, Cardiac Doppler and Color Doppler STAT ECHO Indications:    Respiratory distress  History:        Patient has no prior history of Echocardiogram examinations.                 Signs/Symptoms:Dyspnea. COVID+ SIRS sepsis.  Sonographer:    Dustin Flock Referring Phys: [place] Comments: Patient positioned on right side. IMPRESSIONS  1. Left ventricular ejection fraction, by estimation, is 65 to 70%. The left ventricle has normal function. Left ventricular diastolic parameters are consistent with Grade I diastolic dysfunction (impaired relaxation). There is  the interventricular septum is flattened in systole and diastole, consistent with right ventricular pressure and volume overload.  2. Right ventricular systolic function is severely reduced. The right ventricular size is moderately enlarged. There is moderately elevated pulmonary artery systolic pressure. The estimated right ventricular systolic pressure is 39.5 mmHg.  3. The inferior vena cava is dilated in size with <50% respiratory variability, suggesting right atrial pressure of 15 mmHg. FINDINGS  Left Ventricle: Left ventricular ejection fraction, by estimation, is 65 to 70%. The left ventricle has normal function. The interventricular septum is flattened in systole and diastole, consistent with right ventricular pressure and volume overload. Left ventricular diastolic parameters are consistent with Grade I diastolic dysfunction (impaired relaxation). Right Ventricle: The right ventricular size is moderately enlarged. Right ventricular systolic function is severely reduced. There is moderately elevated pulmonary artery systolic pressure. The tricuspid regurgitant velocity is 3.45 m/s, and with an assumed right atrial pressure of 3 mmHg, the estimated right ventricular systolic pressure is 32.0 mmHg. Venous: The inferior vena cava is dilated in size with less than 50% respiratory variability, suggesting right atrial pressure of 15 mmHg. LEFT VENTRICLE PLAX 2D LVIDd:         3.00 cm  Diastology LVIDs:         2.20 cm  LV e' medial:    5.77 cm/s LV PW:         1.00 cm  LV E/e' medial:  7.3 LV IVS:        0.90 cm  LV e' lateral:   6.53 cm/s LVOT diam:     1.60 cm  LV E/e' lateral: 6.5 LVOT Area:     2.01  cm  RIGHT VENTRICLE RV S prime:     3.48 cm/s LEFT ATRIUM         Index LA diam:    2.00 cm 1.13 cm/m   AORTA Ao Root diam: 2.60 cm MITRAL VALVE               TRICUSPID VALVE MV Area (PHT): 5.62 cm    TR Peak grad:   47.6 mmHg MV Decel Time: 135 msec    TR Vmax:        345.00 cm/s MV E velocity: 42.40 cm/s MV A  velocity: 66.00 cm/s  SHUNTS MV E/A ratio:  0.64        Systemic Diam: 1.60 cm Candee Furbish MD Electronically signed by Candee Furbish MD Signature Date/Time: 02/15/2020/1:24:49 PM    Final       Medications:     Current Medications: . artificial tears  1 application Both Eyes Y3K  . chlorhexidine gluconate (MEDLINE KIT)  15 mL Mouth Rinse BID  . Chlorhexidine Gluconate Cloth  6 each Topical Daily  . feeding supplement (PROSource TF)  45 mL Per Tube TID  . insulin aspart  0-15 Units Subcutaneous Q4H  . insulin aspart  5 Units Subcutaneous Q4H  . insulin glargine  10 Units Subcutaneous BID  . insulin starter kit- pen needles  1 kit Other Once  . Ipratropium-Albuterol  1 puff Inhalation Q6H  . living well with diabetes book   Does not apply Once  . mouth rinse  15 mL Mouth Rinse 10 times per day  . methylPREDNISolone (SOLU-MEDROL) injection  40 mg Intravenous Q12H   Followed by  . [START ON 03/13/2020] predniSONE  50 mg Oral Daily  . pantoprazole (PROTONIX) IV  40 mg Intravenous QHS  . sodium chloride flush  10-40 mL Intracatheter Q12H     Infusions: . sodium chloride    . azithromycin Stopped (02/12/2020 1226)  . bivalirudin (ANGIOMAX) infusion 0.5 mg/mL (Non-ACS indications) 0.05 mg/kg/hr (02/23/2020 1923)  . cefTRIAXone (ROCEPHIN)  IV Stopped (02/21/2020 1103)  . feeding supplement (PIVOT 1.5 CAL)    . fentaNYL infusion INTRAVENOUS 125 mcg/hr (03/07/2020 1227)  . lactated ringers 100 mL/hr at 03/07/2020 1227  . propofol (DIPRIVAN) infusion 30 mcg/kg/min (02/18/2020 1922)  . remdesivir 100 mg in NS 100 mL Stopped (03/08/2020 1030)  . vecuronium (NORCURON) infusion 13m/100mL (1 mg/mL) 1 mcg/kg/min (03/12/2020 1227)      Assessment/Plan   1. Acute hypoxemic respiratory failure: Due to COVID-19 PNA with bilateral infiltrates.  Refractory hypoxemia, VV-ECMO cannulation on 03/08/2020 with improvement in oxygenation.   - Patient has had remdesivir, steroids.  - Empiric antibiotics =>  azithromycin/ceftriaxone.  - Aim for negative fluid balance.  2. RLE DVT/thrombus in RV/suspect PE: Echo with moderately dilated and moderately dysfunctional RV.   - Starting bivalirudin gtt, aiming for PTT 50-80.   Length of Stay: 2  DLoralie Champagne MD  03/10/2020, 7:27 PM  Advanced Heart Failure Team Pager [phone](M-F; 7a - 4p)  Please contact [facility] for night-coverage after hours (4p -7a ) and weekends on [email or web address]

## 2020-03-11 NOTE — Progress Notes (Signed)
ANTICOAGULATION CONSULT NOTE  Pharmacy Consult for bivalirudin Indication: ECMO + DVTs  No Known Allergies  Patient Measurements: Height: 5\' 4"  (162.6 cm) Weight: 72.1 kg (159 lb) IBW/kg (Calculated) : 59.2 Heparin Dosing Weight: 72kg  Vital Signs: Temp: 99.5 F (37.5 C) (12/30 1400) Temp Source: Bladder (12/30 1221) BP: 98/77 (12/30 1200) Pulse Rate: 84 (12/30 1400)  Labs: Recent Labs    04-06-20 0830 April 06, 2020 1810 04-06-2020 2254 03/10/20 0222 02/20/2020 0518  HGB 15.7  --   --  14.1 14.8  HCT 45.2  --   --  40.2 44.2  PLT 259  --   --  302 206  CREATININE 1.22 1.15 0.95 0.93  --     Estimated Creatinine Clearance: 89.4 mL/min (by C-G formula based on SCr of 0.93 mg/dL).   Medical History: History reviewed. No pertinent past medical history.   Assessment: 20 yoM admitted with COVID-19 PNA with worsening hypoxia now s/p cannulation for ECMO. Pt was started on IV heparin prior to cannulation due to acute DVTs and possible PE, now to transition to bivalirudin.   Goal of Therapy:  aPTT 50-80 seconds Monitor platelets by anticoagulation protocol: Yes   Plan:  Start bivalirudin 0.05mg /kg/hr (wt 72kg) Check q4h aPTT until therapeutic x2, then begin q12h Watch closely for S/Sx bleeding   59, PharmD, BCPS, [facility] Clinical Pharmacist [phone] Please check AMION for all [facility] Pharmacy numbers 02/16/2020

## 2020-03-11 NOTE — Progress Notes (Signed)
Report called to [facility] °

## 2020-03-11 NOTE — Procedures (Signed)
Central Venous Catheter Insertion Procedure Note  Alex [name]  [id number]  02-05-1971  Date:02/14/2020  Time:7:04 PM   Provider Performing:Law Corsino   Procedure: Insertion of Non-tunneled Central Venous [phone]) with US guidance (70962)   Indication(s) Medication administration and Difficult access  Consent Risks of the procedure as well as the alternatives and risks of each were explained to the patient and/or caregiver.  Consent for the procedure was obtained and is signed in the bedside chart  Anesthesia Topical only with 1% lidocaine   Timeout Verified patient identification, verified procedure, site/side was marked, verified correct patient position, special equipment/implants available, medications/allergies/relevant history reviewed, required imaging and test results available.  Sterile Technique Maximal sterile technique including full sterile barrier drape, hand hygiene, sterile gown, sterile gloves, mask, hair covering, sterile ultrasound probe cover (if used).  Procedure Description Area of catheter insertion was cleaned with chlorhexidine and draped in sterile fashion.  With real-time ultrasound guidance a central venous catheter was placed into the left subclavian vein. Nonpulsatile blood flow and easy flushing noted in all ports.  The catheter was sutured in place and sterile dressing applied.  Complications/Tolerance Pneumothorax  Unable to cannulate via subclavian approach despite 3 passes. Single pass successful via supraclavicular approach.  Chest tube inserted. Chest X-ray is ordered to verify placement for internal jugular or subclavian cannulation.   Chest x-ray is not ordered for femoral cannulation.  EBL Minimal  Lynnell Catalan, MD [facility] ICU Physician [facility]  Pager: [phone] Or Epic Secure Chat After hours: [phone].  02/27/2020, 7:07 PM

## 2020-03-11 NOTE — Progress Notes (Signed)
Emergent RBC given in cath lab during VV ECMO cannulation. 2 RN's verified product. Patient stable throughout transfusion.  Unit #: [id number]

## 2020-03-12 ENCOUNTER — Inpatient Hospital Stay (HOSPITAL_COMMUNITY): Payer: Medicaid Other

## 2020-03-12 DIAGNOSIS — Z515 Encounter for palliative care: Secondary | ICD-10-CM | POA: Diagnosis not present

## 2020-03-12 DIAGNOSIS — U071 COVID-19: Secondary | ICD-10-CM

## 2020-03-12 DIAGNOSIS — J80 Acute respiratory distress syndrome: Secondary | ICD-10-CM | POA: Diagnosis not present

## 2020-03-12 DIAGNOSIS — J1282 Pneumonia due to coronavirus disease 2019: Secondary | ICD-10-CM

## 2020-03-12 DIAGNOSIS — J9601 Acute respiratory failure with hypoxia: Secondary | ICD-10-CM

## 2020-03-12 DIAGNOSIS — Z7189 Other specified counseling: Secondary | ICD-10-CM

## 2020-03-12 LAB — BASIC METABOLIC PANEL
Anion gap: 8 (ref 5–15)
Anion gap: 7 (ref 5–15)
BUN: 52 mg/dL — ABNORMAL HIGH (ref 6–20)
BUN: 49 mg/dL — ABNORMAL HIGH (ref 6–20)
CO2: 22 mmol/L (ref 22–32)
CO2: 23 mmol/L (ref 22–32)
Calcium: 6.6 mg/dL — ABNORMAL LOW (ref 8.9–10.3)
Calcium: 6.7 mg/dL — ABNORMAL LOW (ref 8.9–10.3)
Chloride: 104 mmol/L (ref 98–111)
Chloride: 107 mmol/L (ref 98–111)
Creatinine, Ser: 1.56 mg/dL — ABNORMAL HIGH (ref 0.61–1.24)
Creatinine, Ser: 1.27 mg/dL — ABNORMAL HIGH (ref 0.61–1.24)
GFR, Estimated: 55 mL/min — ABNORMAL LOW (ref >60)
GFR, Estimated: >60 mL/min (ref >60)
Glucose, Bld: 319 mg/dL — ABNORMAL HIGH (ref 70–99)
Glucose, Bld: 382 mg/dL — ABNORMAL HIGH (ref 70–99)
Potassium: 4.1 mmol/L (ref 3.5–5.1)
Potassium: 4.4 mmol/L (ref 3.5–5.1)
Sodium: 134 mmol/L — ABNORMAL LOW (ref 135–145)
Sodium: 137 mmol/L (ref 135–145)

## 2020-03-12 LAB — POCT I-STAT 7, (LYTES, BLD GAS, ICA,H+H)
Acid-base deficit: 4 mmol/L — ABNORMAL HIGH (ref 0.0–2.0)
Acid-base deficit: 3 mmol/L — ABNORMAL HIGH (ref 0.0–2.0)
Acid-base deficit: 1 mmol/L (ref 0.0–2.0)
Acid-base deficit: 2 mmol/L (ref 0.0–2.0)
Acid-base deficit: 2 mmol/L (ref 0.0–2.0)
Acid-base deficit: 3 mmol/L — ABNORMAL HIGH (ref 0.0–2.0)
Acid-base deficit: 2 mmol/L (ref 0.0–2.0)
Acid-base deficit: 1 mmol/L (ref 0.0–2.0)
Bicarbonate: 22.9 mmol/L (ref 20.0–28.0)
Bicarbonate: 23.3 mmol/L (ref 20.0–28.0)
Bicarbonate: 23.9 mmol/L (ref 20.0–28.0)
Bicarbonate: 22.5 mmol/L (ref 20.0–28.0)
Bicarbonate: 22.4 mmol/L (ref 20.0–28.0)
Bicarbonate: 23.3 mmol/L (ref 20.0–28.0)
Bicarbonate: 24.2 mmol/L (ref 20.0–28.0)
Bicarbonate: 24.7 mmol/L (ref 20.0–28.0)
Calcium, Ion: 1.09 mmol/L — ABNORMAL LOW (ref 1.15–1.40)
Calcium, Ion: 1.05 mmol/L — ABNORMAL LOW (ref 1.15–1.40)
Calcium, Ion: 1.04 mmol/L — ABNORMAL LOW (ref 1.15–1.40)
Calcium, Ion: 1.01 mmol/L — ABNORMAL LOW (ref 1.15–1.40)
Calcium, Ion: 1.06 mmol/L — ABNORMAL LOW (ref 1.15–1.40)
Calcium, Ion: 1.08 mmol/L — ABNORMAL LOW (ref 1.15–1.40)
Calcium, Ion: 1.08 mmol/L — ABNORMAL LOW (ref 1.15–1.40)
Calcium, Ion: 1.04 mmol/L — ABNORMAL LOW (ref 1.15–1.40)
HCT: 29 % — ABNORMAL LOW (ref 39.0–52.0)
HCT: 27 % — ABNORMAL LOW (ref 39.0–52.0)
HCT: 26 % — ABNORMAL LOW (ref 39.0–52.0)
HCT: 28 % — ABNORMAL LOW (ref 39.0–52.0)
HCT: 24 % — ABNORMAL LOW (ref 39.0–52.0)
HCT: 28 % — ABNORMAL LOW (ref 39.0–52.0)
HCT: 29 % — ABNORMAL LOW (ref 39.0–52.0)
HCT: 26 % — ABNORMAL LOW (ref 39.0–52.0)
Hemoglobin: 9.9 g/dL — ABNORMAL LOW (ref 13.0–17.0)
Hemoglobin: 9.2 g/dL — ABNORMAL LOW (ref 13.0–17.0)
Hemoglobin: 8.8 g/dL — ABNORMAL LOW (ref 13.0–17.0)
Hemoglobin: 9.5 g/dL — ABNORMAL LOW (ref 13.0–17.0)
Hemoglobin: 8.2 g/dL — ABNORMAL LOW (ref 13.0–17.0)
Hemoglobin: 9.5 g/dL — ABNORMAL LOW (ref 13.0–17.0)
Hemoglobin: 9.9 g/dL — ABNORMAL LOW (ref 13.0–17.0)
Hemoglobin: 8.8 g/dL — ABNORMAL LOW (ref 13.0–17.0)
O2 Saturation: 99 %
O2 Saturation: 99 %
O2 Saturation: 100 %
O2 Saturation: 98 %
O2 Saturation: 97 %
O2 Saturation: 94 %
O2 Saturation: 98 %
O2 Saturation: 99 %
Patient temperature: 37
Patient temperature: 36.8
Patient temperature: 36.8
Patient temperature: 36.6
Patient temperature: 36.8
Patient temperature: 36.8
Patient temperature: 36.8
Potassium: 3.9 mmol/L (ref 3.5–5.1)
Potassium: 4.1 mmol/L (ref 3.5–5.1)
Potassium: 4.1 mmol/L (ref 3.5–5.1)
Potassium: 4.4 mmol/L (ref 3.5–5.1)
Potassium: 4.4 mmol/L (ref 3.5–5.1)
Potassium: 4.3 mmol/L (ref 3.5–5.1)
Potassium: 4.3 mmol/L (ref 3.5–5.1)
Potassium: 4.4 mmol/L (ref 3.5–5.1)
Sodium: 136 mmol/L (ref 135–145)
Sodium: 136 mmol/L (ref 135–145)
Sodium: 137 mmol/L (ref 135–145)
Sodium: 137 mmol/L (ref 135–145)
Sodium: 137 mmol/L (ref 135–145)
Sodium: 136 mmol/L (ref 135–145)
Sodium: 137 mmol/L (ref 135–145)
Sodium: 137 mmol/L (ref 135–145)
TCO2: 24 mmol/L (ref 22–32)
TCO2: 25 mmol/L (ref 22–32)
TCO2: 25 mmol/L (ref 22–32)
TCO2: 24 mmol/L (ref 22–32)
TCO2: 24 mmol/L (ref 22–32)
TCO2: 25 mmol/L (ref 22–32)
TCO2: 26 mmol/L (ref 22–32)
TCO2: 26 mmol/L (ref 22–32)
pCO2 arterial: 51.6 mmHg — ABNORMAL HIGH (ref 32.0–48.0)
pCO2 arterial: 48.5 mmHg — ABNORMAL HIGH (ref 32.0–48.0)
pCO2 arterial: 42.5 mmHg (ref 32.0–48.0)
pCO2 arterial: 38.1 mmHg (ref 32.0–48.0)
pCO2 arterial: 36.2 mmHg (ref 32.0–48.0)
pCO2 arterial: 46.6 mmHg (ref 32.0–48.0)
pCO2 arterial: 49.4 mmHg — ABNORMAL HIGH (ref 32.0–48.0)
pCO2 arterial: 44 mmHg (ref 32.0–48.0)
pH, Arterial: 7.255 — ABNORMAL LOW (ref 7.350–7.450)
pH, Arterial: 7.289 — ABNORMAL LOW (ref 7.350–7.450)
pH, Arterial: 7.359 (ref 7.350–7.450)
pH, Arterial: 7.379 (ref 7.350–7.450)
pH, Arterial: 7.399 (ref 7.350–7.450)
pH, Arterial: 7.307 — ABNORMAL LOW (ref 7.350–7.450)
pH, Arterial: 7.297 — ABNORMAL LOW (ref 7.350–7.450)
pH, Arterial: 7.357 (ref 7.350–7.450)
pO2, Arterial: 148 mmHg — ABNORMAL HIGH (ref 83.0–108.0)
pO2, Arterial: 152 mmHg — ABNORMAL HIGH (ref 83.0–108.0)
pO2, Arterial: 250 mmHg — ABNORMAL HIGH (ref 83.0–108.0)
pO2, Arterial: 112 mmHg — ABNORMAL HIGH (ref 83.0–108.0)
pO2, Arterial: 93 mmHg (ref 83.0–108.0)
pO2, Arterial: 79 mmHg — ABNORMAL LOW (ref 83.0–108.0)
pO2, Arterial: 109 mmHg — ABNORMAL HIGH (ref 83.0–108.0)
pO2, Arterial: 138 mmHg — ABNORMAL HIGH (ref 83.0–108.0)

## 2020-03-12 LAB — HEPATIC FUNCTION PANEL
ALT: 38 U/L (ref 0–44)
AST: 53 U/L — ABNORMAL HIGH (ref 15–41)
Albumin: 2.4 g/dL — ABNORMAL LOW (ref 3.5–5.0)
Alkaline Phosphatase: 68 U/L (ref 38–126)
Bilirubin, Direct: 0.2 mg/dL (ref 0.0–0.2)
Indirect Bilirubin: 0.3 mg/dL (ref 0.3–0.9)
Total Bilirubin: 0.5 mg/dL (ref 0.3–1.2)
Total Protein: 3.9 g/dL — ABNORMAL LOW (ref 6.5–8.1)

## 2020-03-12 LAB — CBC WITH DIFFERENTIAL/PLATELET
Abs Immature Granulocytes: 0.11 10*3/uL — ABNORMAL HIGH (ref 0.00–0.07)
Basophils Absolute: 0 10*3/uL (ref 0.0–0.1)
Basophils Relative: 0 %
Eosinophils Absolute: 0 10*3/uL (ref 0.0–0.5)
Eosinophils Relative: 0 %
HCT: 32.1 % — ABNORMAL LOW (ref 39.0–52.0)
Hemoglobin: 10.5 g/dL — ABNORMAL LOW (ref 13.0–17.0)
Immature Granulocytes: 2 %
Lymphocytes Relative: 7 %
Lymphs Abs: 0.5 10*3/uL — ABNORMAL LOW (ref 0.7–4.0)
MCH: 27.2 pg (ref 26.0–34.0)
MCHC: 32.7 g/dL (ref 30.0–36.0)
MCV: 83.2 fL (ref 80.0–100.0)
Monocytes Absolute: 0.3 10*3/uL (ref 0.1–1.0)
Monocytes Relative: 4 %
Neutro Abs: 6.1 10*3/uL (ref 1.7–7.7)
Neutrophils Relative %: 87 %
Platelets: 143 10*3/uL — ABNORMAL LOW (ref 150–400)
RBC: 3.86 MIL/uL — ABNORMAL LOW (ref 4.22–5.81)
RDW: 16.6 % — ABNORMAL HIGH (ref 11.5–15.5)
WBC: 7.1 10*3/uL (ref 4.0–10.5)
nRBC: 0 % (ref 0.0–0.2)

## 2020-03-12 LAB — TRIGLYCERIDES: Triglycerides: 498 mg/dL — ABNORMAL HIGH (ref <150)

## 2020-03-12 LAB — CBC
HCT: 28.2 % — ABNORMAL LOW (ref 39.0–52.0)
Hemoglobin: 9.4 g/dL — ABNORMAL LOW (ref 13.0–17.0)
MCH: 27.6 pg (ref 26.0–34.0)
MCHC: 33.3 g/dL (ref 30.0–36.0)
MCV: 82.7 fL (ref 80.0–100.0)
Platelets: 156 10*3/uL (ref 150–400)
RBC: 3.41 MIL/uL — ABNORMAL LOW (ref 4.22–5.81)
RDW: 17.9 % — ABNORMAL HIGH (ref 11.5–15.5)
WBC: 9 10*3/uL (ref 4.0–10.5)
nRBC: 0 % (ref 0.0–0.2)

## 2020-03-12 LAB — APTT
aPTT: 191 seconds (ref 24–36)
aPTT: 46 seconds — ABNORMAL HIGH (ref 24–36)
aPTT: 49 seconds — ABNORMAL HIGH (ref 24–36)
aPTT: 47 seconds — ABNORMAL HIGH (ref 24–36)
aPTT: 47 seconds — ABNORMAL HIGH (ref 24–36)

## 2020-03-12 LAB — BLOOD PRODUCT ORDER (VERBAL) VERIFICATION

## 2020-03-12 LAB — PROTIME-INR
INR: 1.5 — ABNORMAL HIGH (ref 0.8–1.2)
Prothrombin Time: 18 seconds — ABNORMAL HIGH (ref 11.4–15.2)

## 2020-03-12 LAB — HEMOGLOBIN AND HEMATOCRIT, BLOOD
HCT: 29.5 % — ABNORMAL LOW (ref 39.0–52.0)
Hemoglobin: 9.9 g/dL — ABNORMAL LOW (ref 13.0–17.0)

## 2020-03-12 LAB — PREPARE RBC (CROSSMATCH)

## 2020-03-12 LAB — LACTIC ACID, PLASMA
Lactic Acid, Venous: 1.2 mmol/L (ref 0.5–1.9)
Lactic Acid, Venous: 1.7 mmol/L (ref 0.5–1.9)

## 2020-03-12 LAB — FERRITIN: Ferritin: 724 ng/mL — ABNORMAL HIGH (ref 24–336)

## 2020-03-12 LAB — GLUCOSE, CAPILLARY
Glucose-Capillary: 291 mg/dL — ABNORMAL HIGH (ref 70–99)
Glucose-Capillary: 297 mg/dL — ABNORMAL HIGH (ref 70–99)
Glucose-Capillary: 263 mg/dL — ABNORMAL HIGH (ref 70–99)
Glucose-Capillary: 344 mg/dL — ABNORMAL HIGH (ref 70–99)
Glucose-Capillary: 365 mg/dL — ABNORMAL HIGH (ref 70–99)
Glucose-Capillary: 368 mg/dL — ABNORMAL HIGH (ref 70–99)

## 2020-03-12 LAB — MAGNESIUM: Magnesium: 2.8 mg/dL — ABNORMAL HIGH (ref 1.7–2.4)

## 2020-03-12 LAB — FIBRINOGEN: Fibrinogen: 174 mg/dL — ABNORMAL LOW (ref 210–475)

## 2020-03-12 LAB — D-DIMER, QUANTITATIVE: D-Dimer, Quant: >20 ug/mL-FEU — ABNORMAL HIGH (ref 0.00–0.50)

## 2020-03-12 LAB — PHOSPHORUS: Phosphorus: 3.3 mg/dL (ref 2.5–4.6)

## 2020-03-12 LAB — LACTATE DEHYDROGENASE: LDH: 410 U/L — ABNORMAL HIGH (ref 98–192)

## 2020-03-12 LAB — C-REACTIVE PROTEIN: CRP: 6.3 mg/dL — ABNORMAL HIGH (ref <1.0)

## 2020-03-12 MED ORDER — FREE WATER
200.0000 mL | Freq: Four times a day (QID) | Status: DC
  Administered 2020-03-12 – 2020-03-15 (×12): 200 mL

## 2020-03-12 MED ORDER — PIVOT 1.5 CAL PO LIQD
1000.0000 mL | ORAL | Status: DC
  Administered 2020-03-12 – 2020-03-15 (×3): 1000 mL

## 2020-03-12 MED ORDER — LIDOCAINE HCL (PF) 1 % IJ SOLN
INTRAMUSCULAR | Status: AC
  Filled 2020-03-12: qty 30

## 2020-03-12 MED ORDER — CALCIUM GLUCONATE-NACL 1-0.675 GM/50ML-% IV SOLN
1.0000 g | Freq: Once | INTRAVENOUS | Status: AC
  Administered 2020-03-12: 1000 mg via INTRAVENOUS
  Filled 2020-03-12: qty 50

## 2020-03-12 MED ORDER — SODIUM CHLORIDE 0.9 % IV SOLN: INTRAVENOUS | Status: DC

## 2020-03-12 MED ORDER — INSULIN ASPART 100 UNIT/ML ~~LOC~~ SOLN
0.0000 [IU] | SUBCUTANEOUS | Status: DC
  Administered 2020-03-12: 11 [IU] via SUBCUTANEOUS
  Administered 2020-03-12: 20 [IU] via SUBCUTANEOUS
  Administered 2020-03-12: 11 [IU] via SUBCUTANEOUS
  Administered 2020-03-12: 20 [IU] via SUBCUTANEOUS
  Administered 2020-03-12: 15 [IU] via SUBCUTANEOUS
  Administered 2020-03-13: 11 [IU] via SUBCUTANEOUS
  Administered 2020-03-13: 15 [IU] via SUBCUTANEOUS

## 2020-03-12 MED ORDER — SODIUM CHLORIDE 0.9 % IV SOLN
0.0250 mg/kg/h | INTRAVENOUS | Status: DC
  Administered 2020-03-12 – 2020-03-13 (×2): 0.025 mg/kg/h via INTRAVENOUS
  Filled 2020-03-12 (×3): qty 250

## 2020-03-12 MED ORDER — LABETALOL HCL 5 MG/ML IV SOLN
10.0000 mg | INTRAVENOUS | Status: DC | PRN
  Administered 2020-03-13 – 2020-05-09 (×23): 10 mg via INTRAVENOUS
  Filled 2020-03-12 (×19): qty 4

## 2020-03-12 MED ORDER — INSULIN GLARGINE 100 UNIT/ML ~~LOC~~ SOLN
25.0000 [IU] | Freq: Two times a day (BID) | SUBCUTANEOUS | Status: DC
  Administered 2020-03-12: 25 [IU] via SUBCUTANEOUS
  Filled 2020-03-12 (×3): qty 0.25

## 2020-03-12 MED ORDER — PROPRANOLOL HCL 20 MG/5ML PO SOLN
40.0000 mg | Freq: Three times a day (TID) | ORAL | Status: DC
  Administered 2020-03-12 – 2020-03-15 (×10): 40 mg
  Filled 2020-03-12 (×13): qty 10

## 2020-03-12 MED ORDER — SODIUM CHLORIDE 0.9% IV SOLUTION: Freq: Once | INTRAVENOUS | Status: AC

## 2020-03-12 MED ORDER — INSULIN ASPART 100 UNIT/ML ~~LOC~~ SOLN
15.0000 [IU] | Freq: Once | SUBCUTANEOUS | Status: AC
  Administered 2020-03-12: 15 [IU] via SUBCUTANEOUS

## 2020-03-12 MED ORDER — INSULIN GLARGINE 100 UNIT/ML ~~LOC~~ SOLN
15.0000 [IU] | Freq: Two times a day (BID) | SUBCUTANEOUS | Status: DC
  Administered 2020-03-12: 15 [IU] via SUBCUTANEOUS
  Filled 2020-03-12 (×2): qty 0.15

## 2020-03-12 MED ORDER — INSULIN ASPART 100 UNIT/ML ~~LOC~~ SOLN
8.0000 [IU] | SUBCUTANEOUS | Status: DC
  Administered 2020-03-12 – 2020-03-13 (×4): 8 [IU] via SUBCUTANEOUS

## 2020-03-12 MED ORDER — SODIUM CHLORIDE 0.9 % IV SOLN: Freq: Once | INTRAVENOUS | Status: AC

## 2020-03-12 MED FILL — Heparin Sodium (Porcine) Inj 1000 Unit/ML: INTRAMUSCULAR | Qty: 10 | Status: AC

## 2020-03-12 NOTE — Progress Notes (Signed)
ANTICOAGULATION CONSULT NOTE  Pharmacy Consult for Bivalirudin Indication: ECMO + DVTs  No Known Allergies  Patient Measurements: Height: 5\' 4"  (162.6 cm) Weight: 75.9 kg (167 lb 5.3 oz) IBW/kg (Calculated) : 59.2 Heparin Dosing Weight: 72kg  Vital Signs: Temp: 98.42 F (36.9 C) (12/31 1600) Temp Source: Core (12/31 1600) BP: 140/71 (12/31 1716) Pulse Rate: 91 (12/31 1600)  Labs: Recent Labs    02/24/2020 2112 03/05/2020 2116 03/12/20 0332 03/12/20 0340 03/12/20 0600 03/12/20 0605 03/12/20 1024 03/12/20 1217 03/12/20 1336 03/12/20 1605  HGB 10.6*   < > 10.5*   < >  --    < >  --  8.2* 9.5* 9.4*  HCT 31.2*   < > 32.1*   < >  --    < >  --  24.0* 28.0* 28.2*  PLT 156  --  143*  --   --   --   --   --   --  156  APTT  --    < >  --   --  49*  --  47*  --   --  47*  LABPROT 21.8*  --  18.0*  --   --   --   --   --   --   --   INR 2.0*  --  1.5*  --   --   --   --   --   --   --   CREATININE 1.81*  --  1.56*  --   --   --   --   --   --  1.27*   < > = values in this interval not displayed.    Estimated Creatinine Clearance: 67 mL/min (A) (by C-G formula based on SCr of 1.27 mg/dL (H)).   Medical History: History reviewed. No pertinent past medical history.   Assessment: 47 yoM admitted with COVID-19 PNA with worsening hypoxia now s/p cannulation for ECMO. Pt was started on IV heparin prior to cannulation due to acute DVTs and possible PE, now to transition to bivalirudin.   Repeat aPTT tonight remains therapeutic at 47 seconds. Hematoma improving per RN and CBC is stable.  Goal of Therapy:  APTT 40-50 secs per MD Monitor platelets by anticoagulation protocol: Yes   Plan:  Cont Bivalirudin at 0.025 mg/kg/hr q12h aPTT   59, PharmD, BCPS, [facility] Clinical Pharmacist [phone] Please check AMION for all [facility] Pharmacy numbers 03/12/2020

## 2020-03-12 NOTE — Progress Notes (Signed)
OT Cancellation Note  Patient Details Name: Victor Langenbach MRN: [id number] DOB: 24-Aug-1970   Cancelled Treatment:    Reason Eval/Treat Not Completed: Medical issues which prohibited therapy.  Advised to hold therapies until Monday.  OT will continue efforts as appropriate.    Shakila Mak D Marigene Erler 03/12/2020, 11:33 AM

## 2020-03-12 NOTE — Progress Notes (Signed)
ECMO PROGRESS NOTE  NAME:  Alex [name], MRN:  [id number], DOB:  May 14, 1970, LOS: 3 ADMISSION DATE:  03/08/2020, CONSULTATION DATE: 20-Mar-2020 REFERRING MD: Wynona Neat -LBPCCM, CHIEF COMPLAINT: Respiratory failure requiring ECMO  HPI/course in hospital  49 year old man admitted to hospital 12/28 with 1 week history of dyspnea cough nausea and vomiting.  Initially admitted to [facility] long hospital and placed on high flow nasal cannula but rapidly failed and required intubation 12/29.  Persistent hypoxic respiratory failure with PF ratio 55 in spite of 18 of PEEP FiO2 0.1 despite paralytics.  Did not improve with prone ventilation  Cannulated for VV ECMO 12/30 via right IJ crescent cannula. Iatrogenic pneumothorax from left subclavian triple-lumen placement  Family confirms no significant past medical history apart from possible prediabetes  Past Medical History  History reviewed. No pertinent past medical history.   Past Surgical History:  Procedure Laterality Date  . [place] teeth      Interim history/subjective:  Volume seeking overnight with chatter.  Inability to flow above 3 L.  Nevertheless gas exchange adequate on the settings. Recurrent left-sided pneumothorax despite chest tube placement.  Objective   Blood pressure 105/66, pulse 79, temperature 98.24 F (36.8 C), resp. rate 20, height 5\' 4"  (1.626 m), weight 75.9 kg, SpO2 95 %. CVP:  [11 mmHg-14 mmHg] 11 mmHg  Vent Mode: PCV FiO2 (%):  [40 %-100 %] 40 % Set Rate:  [20 bmp-35 bmp] 20 bmp Vt Set:  [350 mL] 350 mL PEEP:  [10 cmH20-18 cmH20] 10 cmH20 Plateau Pressure:  [20 cmH20-38 cmH20] 20 cmH20   Intake/Output Summary (Last 24 hours) at 03/12/2020 1059 Last data filed at 03/12/2020 1000 Gross per 24 hour  Intake 8543.49 ml  Output 3000 ml  Net 5543.49 ml   Filed Weights   03/08/2020 0556 03/12/20 0500  Weight: 72.1 kg 75.9 kg    ECMO Device: Cardiohelp  ECMO Mode: VV  Flow (LPM): 2.92    Examination: Physical Exam Constitutional:      Appearance: Normal appearance. He is normal weight.     Interventions: He is sedated and intubated.  HENT:     Head: Normocephalic and atraumatic.     Comments: Right IJ crescent cannula sutured in place. Eyes:     Conjunctiva/sclera: Conjunctivae normal.  Neck:     Trachea: Trachea normal.     Comments: Orally intubated Cardiovascular:     Rate and Rhythm: Normal rate and regular rhythm.     Pulses: Normal pulses.     Heart sounds: Normal heart sounds.  Pulmonary:     Effort: He is intubated.     Breath sounds: Rales present. No decreased breath sounds, wheezing or rhonchi.     Comments: At the bases bilaterally Chest:     Comments: Left chest tube in place.  Nonfluctuant firmness overlying left infraclavicular area Abdominal:     General: Abdomen is flat. Bowel sounds are normal.     Palpations: Abdomen is soft.     Tenderness: There is no abdominal tenderness.  Genitourinary:    Comments: Foley catheter in place Musculoskeletal:     Right lower leg: No edema.     Left lower leg: No edema.  Neurological:     Mental Status: He is unresponsive.     GCS: GCS eye subscore is 1. GCS verbal subscore is 1. GCS motor subscore is 1.      Ancillary tests (personally reviewed)  CBC: Recent Labs  Lab 02/16/2020 0830 03/10/20 0222  03/05/2020 0518 03/07/2020 1804 02/12/2020 1928 02/16/2020 1949 02/24/2020 2112 03/05/2020 2116 03/12/20 0332 03/12/20 0340 03/12/20 0529 03/12/20 0605 03/12/20 0822  WBC 6.3 6.4 5.0  --  6.6  --  6.7  --  7.1  --   --   --   --   NEUTROABS 5.9 5.9 4.4  --   --   --   --   --  6.1  --   --   --   --   HGB 15.7 14.1 14.8   < > 12.7*   < > 10.6*   < > 10.5* 9.9* 9.2* 8.8* 9.5*  HCT 45.2 40.2 44.2   < > 37.4*   < > 31.2*   < > 32.1* 29.0* 27.0* 26.0* 28.0*  MCV 83.7 83.2 86.2  --  85.0  --  86.2  --  83.2  --   --   --   --   PLT 259 302 206  --  186  --  156  --  143*  --   --   --   --    < > = values in  this interval not displayed.    Basic Metabolic Panel: Recent Labs  Lab 02/14/2020 2254 03/10/20 0222 03/10/20 1849 02/28/2020 0518 02/21/2020 1804 02/12/2020 1928 02/27/2020 1949 02/23/2020 2112 03/04/2020 2116 03/12/20 0332 03/12/20 0340 03/12/20 0529 03/12/20 0605 03/12/20 0822  NA 128* 132*  --   --    < > 128*   < > 132*   < > 134* 136 136 137 137  K 4.1 3.8  --   --    < > 4.7   < > 4.2   < > 4.1 3.9 4.1 4.1 4.4  CL 94* 97*  --   --   --  97*  --  101  --  104  --   --   --   --   CO2 22 25  --   --   --  19*  --  20*  --  22  --   --   --   --   GLUCOSE 209* 188*  --   --   --  327*  --  290*  --  319*  --   --   --   --   BUN 16 16  --   --   --  54*  --  51*  --  52*  --   --   --   --   CREATININE 0.95 0.93  --   --   --  1.98*  --  1.81*  --  1.56*  --   --   --   --   CALCIUM 7.9* 8.0*  --   --   --  6.3*  --  6.1*  --  6.6*  --   --   --   --   MG  --  2.8* 2.8* 3.3*  --   --   --  2.9*  --   --   --   --   --   --   PHOS  --   --  4.2 4.3  --   --   --  3.5  --   --   --   --   --   --    < > = values in this interval not displayed.   GFR: Estimated Creatinine Clearance: 54.6 mL/min (A) (by C-G formula based  on SCr of 1.56 mg/dL (H)). Recent Labs  Lab 2020-03-16 0830 03/10/20 0222 03/08/2020 0518 03/08/2020 1928 02/22/2020 2112 03/12/20 0332  PROCALCITON 18.38  --   --   --   --   --   WBC 6.3   < > 5.0 6.6 6.7 7.1  LATICACIDVEN 1.4  --   --  2.1* 1.8 1.2   < > = values in this interval not displayed.    Liver Function Tests: Recent Labs  Lab Mar 16, 2020 0830 2020-03-16 1810 03/10/20 0222 03/08/2020 2112 03/12/20 0332  AST 76* 96* 117* 63* 53*  ALT 66* 70* 76* 48* 38  ALKPHOS 84 94 92 96 68  BILITOT 1.3* 1.3* 0.7 0.5 0.5  PROT 7.7 7.5 6.4* 4.1* 3.9*  ALBUMIN 3.8 3.4* 3.0* 2.2* 2.4*   No results for input(s): LIPASE, AMYLASE in the last 168 hours. No results for input(s): AMMONIA in the last 168 hours.  ABG    Component Value Date/Time   PHART 7.379 03/12/2020  0822   PCO2ART 38.1 03/12/2020 0822   PO2ART 112 (H) 03/12/2020 0822   HCO3 22.5 03/12/2020 0822   TCO2 24 03/12/2020 0822   ACIDBASEDEF 2.0 03/12/2020 0822   O2SAT 98.0 03/12/2020 0822     Coagulation Profile: Recent Labs  Lab 03/10/2020 2112 03/12/20 0332  INR 2.0* 1.5*    Cardiac Enzymes: No results for input(s): CKTOTAL, CKMB, CKMBINDEX, TROPONINI in the last 168 hours.  HbA1C: Hgb A1c MFr Bld  Date/Time Value Ref Range Status  March 16, 2020 06:10 PM 11.8 (H) 4.8 - 5.6 % Final    Comment:    (NOTE) Pre diabetes:          5.7%-6.4%  Diabetes:              >6.4%  Glycemic control for   <7.0% adults with diabetes   16-Mar-2020 08:30 AM 12.2 (H) 4.8 - 5.6 % Final    Comment:    (NOTE) Pre diabetes:          5.7%-6.4%  Diabetes:              >6.4%  Glycemic control for   <7.0% adults with diabetes     CBG: Recent Labs  Lab 02/23/2020 1147 02/26/2020 1949 02/27/2020 2308 03/12/20 0338 03/12/20 0820  GLUCAP 282* 277* 279* 291* 297*   Chest x-ray (personal interpretation) 12/31: Bilateral bibasilar dense consolidations.  Resolved left pneumothorax following placement of new chest tube.  Assessment & Plan:   Critically ill due to acute hypoxic and hypercarbic respiratory failure requiring VV ECMO support and mechanical ventilation. ARDS due to COVID-19 pneumonia Pneumothorax Bilateral DVT ,cardiac clot. Likely small PE but hemodynamic stability likely will start large clot.  Plan: -Mechanical ventilation to rest settings, observe for spontaneous recovery of tidal volume -Stop paralytic allow patient to wake up. -Progress to extubated awake ECMO if possible. -Titrate ECMO blood and gas flows to normal ABG -Chest tube to continuous suction -Expect patient to be volume seeking for next 24 hours given possible SIRS response to new circuit.  Hold off on diuresis for now. -Complete courses of remdesivir, corticosteroids, Actemra. -Complete empiric courses of  antibiotics for community-acquired pneumonia -Will need 3 months of anticoagulation for thromboembolic disease.  Daily Goals Checklist  Pain/Anxiety/Delirium protocol (if indicated): Propofol fentanyl infusion VAP protocol (if indicated): Bundle Respiratory support goals: Rest ventilator settings.  ECMO support. Blood pressure target: MAP greater than 65 on no vasoactive DVT prophylaxis: Systemic bivalirudin. Nutritional status and feeding goals:  High nutritional risk, initiate tube feeds today via core track GI prophylaxis: Pantoprazole Fluid status goals: Volume seeking.  Allow autoregulation Urinary catheter: Assessment of intravascular volume Central lines: Right IJ crescent, left subclavian triple-lumen, right radial arterial line Glucose control: Suboptimal glycemic control, will change to resistant sliding scale and increase Lantus Mobility/therapy needs: Bedrest for now Antibiotic de-escalation: Complete 7 days of antibiotic Home medication reconciliation: None Daily labs: Per ECMO protocol Code Status: Full Family Communication: Son and mother updated 12/31.  Indicated to them that patient had stabilized on ECMO and the plan was to reduce sedation over the next 48 hours and attempt awake extubated support.  Did inform them that this is not always possible and that amount of support may actually increase as we are catching him fairly early in his disease course. Disposition: ICU.  Critical care time: 50 minutes.    Alex Catalanavi Johntavious Francom, MD [facility] ICU Physician [facility] [place] [facility]  Pager: [phone] Or Epic Secure Chat After hours: [phone].  03/12/2020, 10:59 AM

## 2020-03-12 NOTE — Progress Notes (Signed)
ANTICOAGULATION CONSULT NOTE  Pharmacy Consult for Bivalirudin Indication: ECMO + DVTs  No Known Allergies  Patient Measurements: Height: 5\' 4"  (162.6 cm) Weight: 72.1 kg (159 lb) IBW/kg (Calculated) : 59.2 Heparin Dosing Weight: 72kg  Vital Signs: Temp: 97.88 F (36.6 C) (12/31 0007) Temp Source: Bladder (12/30 2332) BP: 104/58 (12/30 2332) Pulse Rate: 80 (12/31 0007)  Labs: Recent Labs    03/10/20 0222 02/22/2020 0518 02/14/2020 1804 03/07/2020 1928 02/19/2020 1949 03/12/2020 2112 02/16/2020 2116 02/21/2020 2306  HGB 14.1 14.8   < > 12.7* 12.2* 10.6* 10.2*  --   HCT 40.2 44.2   < > 37.4* 36.0* 31.2* 30.0*  --   PLT 302 206  --  186  --  156  --   --   APTT  --   --   --   --   --   --   --  191*  LABPROT  --   --   --   --   --  21.8*  --   --   INR  --   --   --   --   --  2.0*  --   --   CREATININE 0.93  --   --  1.98*  --  1.81*  --   --    < > = values in this interval not displayed.    Estimated Creatinine Clearance: 46 mL/min (A) (by C-G formula based on SCr of 1.81 mg/dL (H)).   Medical History: History reviewed. No pertinent past medical history.   Assessment: 17 yoM admitted with COVID-19 PNA with worsening hypoxia now s/p cannulation for ECMO. Pt was started on IV heparin prior to cannulation due to acute DVTs and possible PE, now to transition to bivalirudin.    12/31 AM update:  Initial aPTT is supra-therapeutic at 191, drawn correctly Worsening left upper chest wall hematoma per RN Hgb down to 10.2  Goal of Therapy:  aPTT 50-80 seconds Monitor platelets by anticoagulation protocol: Yes   Plan:  Hold bivalirudin for 2 hours Will have RN contact MD regarding hematoma prior to re-starting If ok to re-start bival per MD, will check aPTT prior  1/32, PharmD, BCPS Clinical Pharmacist Phone: [phone]

## 2020-03-12 NOTE — Consult Note (Signed)
Consultation Note Date: 03/12/2020   Patient Name: Alex [name]  DOB: [birth year]  MRN: [id number]  Age / Sex: 49 y.o., male  PCP: Patient, No Pcp Per Referring Physician: Kipp Brood, MD  Reason for Consultation: ECMO  HPI/Patient Profile: 49 y.o. male  with no significant past medical history admitted on 02/24/2020 with dyspnea, cough, nausea/vomiting ~ 1 week ago with worsening symptoms of body aches and fatigue and +COVID 02/07/20 and admitted with shortness of breath. Required intubation 03/10/20. Cannulated for VV ECMO 02/13/2020. Oxygenating better with ECMO. Also evidence of RLE DVT, RV thrombus, and high suspicion of PE. Has required chest tube to L lung for collapse which needs further reposition with recurrent collapse.   Clinical Assessment and Goals of Care: I met today during ECMO rounds for Alex [name]. He is on COVID precautions so no family at bedside. Plans discussed for reposition of chest tube to improve left chest collapsed lung. Plans after to begin to wean sedation as much as possible. Hopeful that he can begin vent weaning and potential extubation in the near future.   I called and spoke with son, Alex [name]. I explained palliative care role to offer support to he and his family during this time of his father's critical illness. Discussed my goal to ensure Alex [name] is informed and updated in order to continue to make good decisions while he speaks on his father's behalf. Discussed ECMO use and that this could be a very long journey and illness for Alex [name]. He has spoken with Dr. Lynetta Mare today and is familiar with the plan.   All questions/concerns addressed. Emotional support provided.   Primary Decision Maker NEXT OF KIN [name]    SUMMARY OF RECOMMENDATIONS   - ECMO and vent support with hopes of eventual improvement and weaning   Code Status/Advance Care Planning:  Full  code   Symptom Management:   Per PCCM/ECMO team  Palliative Prophylaxis:   Aspiration, Bowel Regimen, Delirium Protocol, Frequent Pain Assessment, Oral Care and Turn Reposition  Additional Recommendations (Limitations, Scope, Preferences):  Full Scope Treatment  Psycho-social/Spiritual:   Desire for further Chaplaincy support:no  Additional Recommendations: Caregiving  Support/Resources  Prognosis:  To be determined  Discharge Planning: To Be Determined      Primary Diagnoses: Present on Admission: . COVID-19 . Respiratory failure ([place])   I have reviewed the medical record, interviewed the patient and family, and examined the patient. The following aspects are pertinent.  History reviewed. No pertinent past medical history. Social History   Socioeconomic History  . Marital status: Single    Spouse name: Not on file  . Number of children: Not on file  . Years of education: Not on file  . Highest education level: Not on file  Occupational History  . Not on file  Tobacco Use  . Smoking status: Never Smoker  . Smokeless tobacco: Never Used  Substance and Sexual Activity  . Alcohol use: Yes    Comment: occasionally  . Drug use: Never  . Sexual activity: Not  Currently  Other Topics Concern  . Not on file  Social History Narrative  . Not on file   Social Determinants of Health   Financial Resource Strain: Not on file  Food Insecurity: Not on file  Transportation Needs: Not on file  Physical Activity: Not on file  Stress: Not on file  Social Connections: Not on file   Family History  Problem Relation Age of Onset  . Heart disease Father   . Heart disease Mother   . Breast cancer Mother    Scheduled Meds: . artificial tears  1 application Both Eyes V9D  . chlorhexidine gluconate (MEDLINE KIT)  15 mL Mouth Rinse BID  . Chlorhexidine Gluconate Cloth  6 each Topical Daily  . feeding supplement (PROSource TF)  45 mL Per Tube BID  . feeding supplement  (VITAL HIGH PROTEIN)  1,000 mL Per Tube Q24H  . insulin aspart  0-20 Units Subcutaneous Q4H  . insulin aspart  5 Units Subcutaneous Q4H  . insulin glargine  15 Units Subcutaneous BID  . insulin starter kit- pen needles  1 kit Other Once  . lidocaine (PF)      . living well with diabetes book   Does not apply Once  . mouth rinse  15 mL Mouth Rinse 10 times per day  . methylPREDNISolone (SOLU-MEDROL) injection  40 mg Intravenous Q12H   Followed by  . [START ON 03/13/2020] predniSONE  50 mg Oral Daily  . pantoprazole (PROTONIX) IV  40 mg Intravenous QHS  . sodium chloride flush  10-40 mL Intracatheter Q12H   Continuous Infusions: . sodium chloride    . sodium chloride 10 mL/hr at 03/12/20 0900  . sodium chloride Stopped (03/12/20 0310)  . sodium chloride Stopped (03/12/20 0131)  . albumin human Stopped (03/12/20 0041)  . azithromycin Stopped (03/08/2020 1226)  . bivalirudin (ANGIOMAX) infusion 0.5 mg/mL (Non-ACS indications) 0.025 mg/kg/hr (03/12/20 0900)  . cefTRIAXone (ROCEPHIN)  IV Stopped (02/26/2020 1103)  . fentaNYL infusion INTRAVENOUS 125 mcg/hr (03/12/20 0900)  . lactated ringers Stopped (02/14/2020 1522)  . norepinephrine (LEVOPHED) Adult infusion 2 mcg/min (03/12/20 0900)  . propofol (DIPRIVAN) infusion 30 mcg/kg/min (03/12/20 0900)  . remdesivir 100 mg in NS 100 mL Stopped (03/04/2020 1030)  . vecuronium (NORCURON) infusion 146m/100mL (1 mg/mL) 1 mcg/kg/min (03/12/20 0900)   PRN Meds:.Place/Maintain arterial line **AND** sodium chloride, sodium chloride, sodium chloride, acetaminophen, albumin human, chlorpheniramine-HYDROcodone, dextrose, fentaNYL, ondansetron (ZOFRAN) IV, sodium chloride flush, vecuronium No Known Allergies Review of Systems  Unable to perform ROS: Acuity of condition    Physical Exam Vitals and nursing note reviewed.  Constitutional:      General: He is not in acute distress. Cardiovascular:     Rate and Rhythm: Normal rate.  Pulmonary:     Effort: No  tachypnea, accessory muscle usage or respiratory distress.     Comments: Sedated on vent VV ECMO Abdominal:     General: Abdomen is flat.  Neurological:     Comments: Sedated     Vital Signs: BP (!) 97/59   Pulse 84   Temp 98.06 F (36.7 C)   Resp 20   Ht _0  (1.626 m)   Wt 75.9 kg   SpO2 100%   BMI 28.72 kg/m  Pain Scale: CPOT   Pain Score: 0-No pain   SpO2: SpO2: 100 % O2 Device:SpO2: 100 % O2 Flow Rate: .O2 Flow Rate (L/min): 70 L/min  IO: Intake/output summary:   Intake/Output Summary (Last 24 hours) at 03/12/2020 [phone]  Last data filed at 03/12/2020 0900 Gross per 24 hour  Intake 8478.25 ml  Output 2850 ml  Net 5628.25 ml    LBM: Last BM Date: 02/28/2020 Baseline Weight: Weight: 72.1 kg Most recent weight: Weight: 75.9 kg     Palliative Assessment/Data:     Time In: 1300 Time Out: 1350 Time Total: 50 min Greater than 50%  of this time was spent counseling and coordinating care related to the above assessment and plan.  Signed by: Vinie Sill, NP Palliative Medicine Team Pager # [phone] (M-F 8a-5p) Team Phone # [phone] (Nights/Weekends)

## 2020-03-12 NOTE — Procedures (Signed)
Extracorporeal support note   ECLS support day: 1 Indication: Severe respiratory failure secondary to COVID-19 pneumonia with RV dysfunction  Configuration: Venovenous  Drainage cannula: 32 French crescent cannula via right IJ Return cannula: Same  Pump speed: 2980 RPM Pump flow: 3.58 L/min Pump used: Cardio help  Oxygenator: Cardio help O2 blender: 100% Sweep gas: 2  Circuit check: No clots Anticoagulant: Bivalirudin Anticoagulation targets: 50-80  Changes in support: ECMO initiated this evening.  Can you positioning confirmed by fluoroscopy and transesophageal echocardiography. Initial difficulty maintaining pump speed with significant chatter.  Patient volume seeking responsive to boluses of albumin and normal saline.  2 units of blood given.  Anticipated goals/duration of support: Bridge to recovery.  Lynnell Catalan, MD [facility] ICU Physician [facility] Critical Care  Pager: [phone] Mobile: [phone] After hours: [phone].  03/12/2020, 10:49 AM

## 2020-03-12 NOTE — Progress Notes (Signed)
Nutrition Follow-up  DOCUMENTATION CODES:   Not applicable  INTERVENTION:   Plan for Cortrak today  Tube Feeding via Cortrak Change to Pivot 1.5 at 60 ml/hr D/C Pro-Source TF Provides 2160 kcals, 135 g of protein and 1094 mL of free water  TF regimen and propofol at current rate providing 2503 total kcal/day   Add free water of 200 mL q 6 hours; provides 1894 mL  Recommend considering insulin drip to manage CBGs, at least initially, with ICU goal of 140-180   NUTRITION DIAGNOSIS:   Increased nutrient needs related to acute illness,catabolic illness (COVID-19 infection) as evidenced by estimated needs.  Being addressed via TF   GOAL:   Patient will meet greater than or equal to 90% of their needs  Progressing  MONITOR:   Vent status,TF tolerance,Labs,Weight trends  REASON FOR ASSESSMENT:   Ventilator,Consult Enteral/tube feeding initiation and management  ASSESSMENT:   49 y.o. male with no significant medical history. He presented to the ED with dyspnea, cough, and N/V. He reported that his symptoms began with cough 1 week ago. He tried OTC meds but nothing helped. Symptoms worsened to include body aches, fatigue, and N/V 3 days later. He went to his PCP 12/26 and got tested for COVID; he was positive.  12/26 COVID+  12/28 Admitted to [facility] 12/29 Intubated 12/30 Transferred to [facility], VV ECMO cannulation, L PTX with Chest tube insertion  Remains on VV ECMO Pt sedated and paralyzed on vent support; sedated on fentanyl and propofol gtt. Requiring levophed. On Bival gtt Propofol: 13 ml/hr  OG tube in place, Vital High Protein at 40 ml/hr infusing, Pro-source TF 45 mL BID. Plan for Cortrak placement today  Chest tube with 870 mL yesterday, 250 mL today thus far  Recommend considering insulin drip initially to control CBGs, ICU goal 140-180  Weight up post procedure  Labs: CBGs 291-297 Meds:  Ss novolog, novolog q 4 hours, lantus, LR at 100 ml/hr    Diet Order:    Diet Order            Diet NPO time specified  Diet effective now                 EDUCATION NEEDS:   Not appropriate for education at this time  Skin:  Skin Assessment: Reviewed RN Assessment  Last BM:  12/31 rectal tube  Height:   Ht Readings from Last 1 Encounters:  02/14/2020 5\' 4"  (1.626 m)    Weight:   Wt Readings from Last 1 Encounters:  03/12/20 75.9 kg    BMI:  Body mass index is 28.72 kg/m.  Estimated Nutritional Needs:   Kcal:  2160-2520 kcals  Protein:  130-150 g  Fluid:  >/= 2 L   05-30-1992 MS, RDN, LDN, CNSC Registered Dietitian III Clinical Nutrition RD Pager and On-Call Pager Number Located in [place]

## 2020-03-12 NOTE — Procedures (Signed)
Extracorporeal support note   ECLS support day: 2 Indication: Severe respiratory failure secondary to COVID-19 pneumonia with RV dysfunction  Configuration: Venovenous  Drainage cannula: 32 French crescent cannula via right IJ Return cannula: Same  Pump speed: 2425 RPM Pump flow: 2.92 L/min Pump used: Cardio help  Oxygenator: Cardio help O2 blender: 100% Sweep gas: 4   Circuit check: No clots Anticoagulant: Bivalirudin Anticoagulation targets: 40-50 for next 24 hours then increase 50-80  Changes in support: Continue current level of support.  May remain volume seeking over next 24 hours.  Conservative anticoagulation target today given possible chest wall hematoma.  Requirements may increase as patient becomes more awake.  Anticipated goals/duration of support: Bridge to recovery.  Lynnell Catalan, MD [facility] ICU Physician [facility] Critical Care  Pager: [phone] Mobile: [phone] After hours: [phone].  03/12/2020, 10:56 AM

## 2020-03-12 NOTE — Progress Notes (Addendum)
Patient ID: Alex [name], male   DOB: 08/15/1970, 49 y.o.   MRN: [id number]     Advanced Heart Failure Rounding Note  PCP-Cardiologist: No primary care provider on file.   Subjective:    - 12/30: VV ECMO cannulation  Overnight, developed extensive hematoma at left subclavian CVL site, bivalirudin rate decreased for PTT 40-50.   CXR last night showed re-expanded left PTX, but this morning, the left lung is again collapsed by CXR.   Bivalirudin gtt PTT 49  Patient is on NE 2 this morning, I/Os positive.  Creatinine lower at 1.56.   Patient remains on ceftriaxone/azithromycin.   ECMO parameters: 2400 rpm Flow 3.12 L/min Pvenous -32 Delta P 11 Sweep 4 ABG 7.35/43/50/100% LDH 410 Lactate 1.2  Objective:   Weight Range: 75.9 kg Body mass index is 28.72 kg/m.   Vital Signs:   Temp:  [97.5 F (36.4 C)-99.5 F (37.5 C)] 98.24 F (36.8 C) (12/31 0700) Pulse Rate:  [0-295] 88 (12/31 0700) Resp:  [0-76] 20 (12/31 0700) BP: (61-140)/(36-88) 115/64 (12/31 0400) SpO2:  [0 %-100 %] 99 % (12/31 0700) Arterial Line BP: (90-143)/(53-115) 103/59 (12/31 0700) FiO2 (%):  [40 %-100 %] 40 % (12/31 0400) Weight:  [75.9 kg] 75.9 kg (12/31 0500) Last BM Date: 02/12/2020  Weight change: Filed Weights   03/02/2020 0556 03/12/20 0500  Weight: 72.1 kg 75.9 kg    Intake/Output:   Intake/Output Summary (Last 24 hours) at 03/12/2020 0749 Last data filed at 03/12/2020 0700 Gross per 24 hour  Intake 8333.69 ml  Output 2450 ml  Net 5883.69 ml      Physical Exam    General:  Intubated/sedated.  HEENT: Normal Neck: Supple. JVP 8-9 cm. Carotids 2+ bilat; no bruits. No lymphadenopathy or thyromegaly appreciated. Cor: PMI nondisplaced. Regular rate & rhythm. No rubs, gallops or murmurs. Lungs: Decreased BS on left Abdomen: Soft, nontender, nondistended. No hepatosplenomegaly. No bruits or masses. Good bowel sounds. Extremities: No cyanosis, clubbing, rash, edema Neuro:  Sedated/paralyzed   Telemetry   NSR 90s (personally reviewed)  Labs    CBC Recent Labs    03/06/2020 0518 03/10/2020 1804 02/22/2020 2112 03/08/2020 2116 03/12/20 0332 03/12/20 0340 03/12/20 0529 03/12/20 0605  WBC 5.0   < > 6.7  --  7.1  --   --   --   NEUTROABS 4.4  --   --   --  6.1  --   --   --   HGB 14.8   < > 10.6*   < > 10.5*   < > 9.2* 8.8*  HCT 44.2   < > 31.2*   < > 32.1*   < > 27.0* 26.0*  MCV 86.2   < > 86.2  --  83.2  --   --   --   PLT 206   < > 156  --  143*  --   --   --    < > = values in this interval not displayed.   Basic Metabolic Panel Recent Labs    02/25/2020 0518 02/16/2020 1804 02/22/2020 2112 03/01/2020 2116 03/12/20 0332 03/12/20 0340 03/12/20 0529 03/12/20 0605  NA  --    < > 132*   < > 134*   < > 136 137  K  --    < > 4.2   < > 4.1   < > 4.1 4.1  CL  --    < > 101  --  104  --   --   --     CO2  --    < > 20*  --  22  --   --   --   GLUCOSE  --    < > 290*  --  319*  --   --   --   BUN  --    < > 51*  --  52*  --   --   --   CREATININE  --    < > 1.81*  --  1.56*  --   --   --   CALCIUM  --    < > 6.1*  --  6.6*  --   --   --   MG 3.3*  --  2.9*  --   --   --   --   --   PHOS 4.3  --  3.5  --   --   --   --   --    < > = values in this interval not displayed.   Liver Function Tests Recent Labs    02/21/2020 2112 03/12/20 0332  AST 63* 53*  ALT 48* 38  ALKPHOS 96 68  BILITOT 0.5 0.5  PROT 4.1* 3.9*  ALBUMIN 2.2* 2.4*   No results for input(s): LIPASE, AMYLASE in the last 72 hours. Cardiac Enzymes No results for input(s): CKTOTAL, CKMB, CKMBINDEX, TROPONINI in the last 72 hours.  BNP: BNP (last 3 results) No results for input(s): BNP in the last 8760 hours.  ProBNP (last 3 results) No results for input(s): PROBNP in the last 8760 hours.   D-Dimer Recent Labs    03/02/2020 0518 03/12/20 0332  DDIMER >20.00* >20.00*   Hemoglobin A1C Recent Labs    02/22/2020 1810  HGBA1C 11.8*   Fasting Lipid Panel Recent Labs     03/12/20 0332  TRIG 498*   Thyroid Function Tests No results for input(s): TSH, T4TOTAL, T3FREE, THYROIDAB in the last 72 hours.  Invalid input(s): FREET3  Other results:   Imaging    CARDIAC CATHETERIZATION  Result Date: 03/06/2020 Successful VV ECMO cannulation.  Patient will start on bivalirudin.   DG CHEST PORT 1 VIEW  Result Date: 03/12/2020 CLINICAL DATA:  49 year old male on ECMO.  COVID-19. EXAM: PORTABLE CHEST 1 VIEW COMPARISON:  Portable chest 02/19/2020 and earlier. FINDINGS: Portable AP semi upright view at at 0549 hours. Endotracheal tube tip in good position between the clavicles and carina. Enteric tube courses to the abdomen, tip not included. Stable left chest central line. Left chest tube in place and stable. Stable right chest ECMO cannula. Suspected new left pneumothorax, with paucity of lung markings in the left apex. Probable superimposed small to moderate volume left pleural effusion. Increased dense opacification of the left lung base also. There are air bronchograms at the left hilum. Basilar predominant right lung opacity with right lower lung consolidation also suspected. Right lung ventilation not significantly changed from yesterday. Stable cardiac size and mediastinal contours. Visualized tracheal air column is within normal limits. IMPRESSION: 1. Stable lines and tubes. Left chest tube remains in place but new left pneumothorax is suspected, with worsening left lung ventilation and probable at least small volume superimposed left pleural fluid. Noncontrast Chest CT would confirm. 2. Stable right lung with lung base consolidation. Electronically Signed   By: Genevie Ann M.D.   On: 03/12/2020 07:40   DG CHEST PORT 1 VIEW  Result Date: 02/20/2020 CLINICAL DATA:  ECMO.  Chest tube in place. EXAM: PORTABLE CHEST 1 VIEW COMPARISON:  Radiograph yesterday.  FINDINGS: Endotracheal tube tip 3.5 cm from the carina. Enteric tube in place with tip below the diaphragm not  included in the field of view. There is a left-sided chest tube in place. Left central venous catheter tip in the region of the mid SVC. ECMO catheter extends from the right internal jugular region to the subdiaphragmatic IVC, not included in the field of view. There is new pneumomediastinum allowing the right and left heart border. Probable pneumomediastinum adjacent to the distal esophagus. Suspected pneumomediastinum tracking along the bronchus in the right middle lobe. Decreasing opacity in the left mid lung. Otherwise unchanged diffuse bilateral lung opacities. Stable heart size and mediastinal contours. IMPRESSION: 1. New pneumomediastinum, moderate in degree. 2. New left-sided chest tube in place. Decreasing opacity in the left mid lung may represent improving pleural effusion. Otherwise unchanged multifocal pneumonia. 3. New left subclavian central line tip in the mid SVC. New right-sided ECMO catheter in place. These results will be called to the ordering clinician or representative by the Radiologist Assistant, and communication documented in the PACS or [facility] Dashboard. Electronically Signed   By: Melanie  Sanford M.D.   On: 03/02/2020 20:14   VAS US LOWER EXTREMITY VENOUS (DVT)  Result Date: 02/20/2020  Lower Venous DVT Study Indications: D-dimer >20, Covid-19, concern for PE.  Anticoagulation: Heparin. Comparison Study: No prior studies. Performing Technologist: Rachel Hodge RDMS  Examination Guidelines: A complete evaluation includes B-mode imaging, spectral Doppler, color Doppler, and power Doppler as needed of all accessible portions of each vessel. Bilateral testing is considered an integral part of a complete examination. Limited examinations for reoccurring indications may be performed as noted. The reflux portion of the exam is performed with the patient in reverse Trendelenburg.  +---------+---------------+---------+-----------+----------+--------------+ RIGHT     CompressibilityPhasicitySpontaneityPropertiesThrombus Aging +---------+---------------+---------+-----------+----------+--------------+ CFV      Partial        Yes      Yes                  Acute          +---------+---------------+---------+-----------+----------+--------------+ SFJ      Full                                                        +---------+---------------+---------+-----------+----------+--------------+ FV Prox                 Yes      Yes                                 +---------+---------------+---------+-----------+----------+--------------+ FV Mid                  Yes      Yes                                 +---------+---------------+---------+-----------+----------+--------------+ FV Distal               Yes      Yes                                 +---------+---------------+---------+-----------+----------+--------------+ PFV                       Yes      Yes                                 +---------+---------------+---------+-----------+----------+--------------+ POP                     Yes      Yes                                 +---------+---------------+---------+-----------+----------+--------------+ PTV                     Yes      Yes                                 +---------+---------------+---------+-----------+----------+--------------+ PERO                    Yes      Yes                                 +---------+---------------+---------+-----------+----------+--------------+   +---------+---------------+---------+-----------+----------+-------------------+ LEFT     CompressibilityPhasicitySpontaneityPropertiesThrombus Aging      +---------+---------------+---------+-----------+----------+-------------------+ CFV      Full           Yes      Yes                                      +---------+---------------+---------+-----------+----------+-------------------+ SFJ      Full                                                              +---------+---------------+---------+-----------+----------+-------------------+ FV Prox  Full                                                             +---------+---------------+---------+-----------+----------+-------------------+ FV Mid   Full                                                             +---------+---------------+---------+-----------+----------+-------------------+ FV DistalPartial        Yes      Yes                                      +---------+---------------+---------+-----------+----------+-------------------+ PFV      Full                                                             +---------+---------------+---------+-----------+----------+-------------------+   POP                     Yes      Yes                                      +---------+---------------+---------+-----------+----------+-------------------+ PTV                     Yes      Yes                                      +---------+---------------+---------+-----------+----------+-------------------+ PERO                                                  Not well visualized +---------+---------------+---------+-----------+----------+-------------------+     Summary: RIGHT: - Findings consistent with partial, acute deep vein thrombosis involving the right common femoral vein at the level of the saphenofemoral junction. - No cystic structure found in the popliteal fossa.  LEFT: - Findings consistent with partial, acute deep vein thrombosis involving the distal left femoral vein. - No cystic structure found in the popliteal fossa.  *See table(s) above for measurements and observations. Electronically signed by Brandon Cain MD on 02/18/2020 at 8:18:18 PM.    Final    ECHOCARDIOGRAM LIMITED  Result Date: 02/17/2020    ECHOCARDIOGRAM LIMITED REPORT   Patient Name:   Alex [name] Date of Exam: 02/23/2020 Medical Rec #:   [id number]      Height:       64.0 in Accession #:    [id number]     Weight:       159.0 lb Date of Birth:  03/03/1971      BSA:          1.775 m Patient Age:    49 years       BP:           98/66 mmHg Patient Gender: M              HR:           94 bpm. Exam Location:  Inpatient Procedure: Limited Echo, Cardiac Doppler and Color Doppler STAT ECHO Indications:    Respiratory distress  History:        Patient has no prior history of Echocardiogram examinations.                 Signs/Symptoms:Dyspnea. COVID+ SIRS sepsis.  Sonographer:    Brooke Strickland Referring Phys: [id number] PETER E BABCOCK  Sonographer Comments: Patient positioned on right side. IMPRESSIONS  1. Left ventricular ejection fraction, by estimation, is 65 to 70%. The left ventricle has normal function. Left ventricular diastolic parameters are consistent with Grade I diastolic dysfunction (impaired relaxation). There is the interventricular septum is flattened in systole and diastole, consistent with right ventricular pressure and volume overload.  2. Right ventricular systolic function is severely reduced. The right ventricular size is moderately enlarged. There is moderately elevated pulmonary artery systolic pressure. The estimated right ventricular systolic pressure is 50.6 mmHg.  3. The inferior vena cava is dilated in size with <50% respiratory variability, suggesting right atrial pressure of 15 mmHg. FINDINGS    Left Ventricle: Left ventricular ejection fraction, by estimation, is 65 to 70%. The left ventricle has normal function. The interventricular septum is flattened in systole and diastole, consistent with right ventricular pressure and volume overload. Left ventricular diastolic parameters are consistent with Grade I diastolic dysfunction (impaired relaxation). Right Ventricle: The right ventricular size is moderately enlarged. Right ventricular systolic function is severely reduced. There is moderately elevated pulmonary artery systolic  pressure. The tricuspid regurgitant velocity is 3.45 m/s, and with an assumed right atrial pressure of 3 mmHg, the estimated right ventricular systolic pressure is 50.6 mmHg. Venous: The inferior vena cava is dilated in size with less than 50% respiratory variability, suggesting right atrial pressure of 15 mmHg. LEFT VENTRICLE PLAX 2D LVIDd:         3.00 cm  Diastology LVIDs:         2.20 cm  LV e' medial:    5.77 cm/s LV PW:         1.00 cm  LV E/e' medial:  7.3 LV IVS:        0.90 cm  LV e' lateral:   6.53 cm/s LVOT diam:     1.60 cm  LV E/e' lateral: 6.5 LVOT Area:     2.01 cm  RIGHT VENTRICLE RV S prime:     3.48 cm/s LEFT ATRIUM         Index LA diam:    2.00 cm 1.13 cm/m   AORTA Ao Root diam: 2.60 cm MITRAL VALVE               TRICUSPID VALVE MV Area (PHT): 5.62 cm    TR Peak grad:   47.6 mmHg MV Decel Time: 135 msec    TR Vmax:        345.00 cm/s MV E velocity: 42.40 cm/s MV A velocity: 66.00 cm/s  SHUNTS MV E/A ratio:  0.64        Systemic Diam: 1.60 cm Mark Skains MD Electronically signed by Mark Skains MD Signature Date/Time: 02/16/2020/1:24:49 PM    Final       Medications:     Scheduled Medications: . artificial tears  1 application Both Eyes Q8H  . chlorhexidine gluconate (MEDLINE KIT)  15 mL Mouth Rinse BID  . Chlorhexidine Gluconate Cloth  6 each Topical Daily  . feeding supplement (PROSource TF)  45 mL Per Tube BID  . feeding supplement (VITAL HIGH PROTEIN)  1,000 mL Per Tube Q24H  . insulin aspart  0-20 Units Subcutaneous Q4H  . insulin aspart  5 Units Subcutaneous Q4H  . insulin glargine  15 Units Subcutaneous BID  . insulin starter kit- pen needles  1 kit Other Once  . Ipratropium-Albuterol  1 puff Inhalation Q6H  . living well with diabetes book   Does not apply Once  . mouth rinse  15 mL Mouth Rinse 10 times per day  . methylPREDNISolone (SOLU-MEDROL) injection  40 mg Intravenous Q12H   Followed by  . [START ON 03/13/2020] predniSONE  50 mg Oral Daily  . pantoprazole  (PROTONIX) IV  40 mg Intravenous QHS  . sodium chloride flush  10-40 mL Intracatheter Q12H     Infusions: . sodium chloride    . sodium chloride 10 mL/hr at 03/12/20 0700  . sodium chloride Stopped (03/12/20 0310)  . sodium chloride Stopped (03/12/20 0131)  . albumin human Stopped (03/12/20 0041)  . azithromycin Stopped (03/08/2020 1226)  . bivalirudin (ANGIOMAX) infusion 0.5 mg/mL (Non-ACS indications) 0.025 mg/kg/hr (03/12/20 0700)  .   calcium gluconate    . cefTRIAXone (ROCEPHIN)  IV Stopped (02/15/2020 1103)  . fentaNYL infusion INTRAVENOUS 125 mcg/hr (03/12/20 0700)  . lactated ringers Stopped (02/11/2020 1522)  . norepinephrine (LEVOPHED) Adult infusion 2 mcg/min (03/12/20 0700)  . propofol (DIPRIVAN) infusion 30 mcg/kg/min (03/12/20 0748)  . remdesivir 100 mg in NS 100 mL Stopped (03/02/2020 1030)  . vecuronium (NORCURON) infusion 100mg/100mL (1 mg/mL) 1 mcg/kg/min (03/12/20 0700)     PRN Medications:  Place/Maintain arterial line **AND** sodium chloride, sodium chloride, sodium chloride, acetaminophen, albumin human, chlorpheniramine-HYDROcodone, dextrose, fentaNYL, ondansetron (ZOFRAN) IV, sodium chloride flush, vecuronium   Assessment/Plan   1. Acute hypoxemic respiratory failure: Due to COVID-19 PNA with bilateral infiltrates.  Refractory hypoxemia, VV-ECMO cannulation on 02/13/2020 with improvement in oxygenation.  Developed left PTX post-subclavian CVL and has left chest tube, the left lung is collapsed again today. - ECMO circuit functioning appropriately, excellent ABG this morning.   - Patient has had remdesivir, getting tocilizumab. - Ongoing steroids with Solumedrol.  - Empiric antibiotics => azithromycin/ceftriaxone.  - Will need repositioning of left CT today, d/w Dr. Atkins.  - Would aim to get off paralytics after CT repositioned.  - Would aim for extubation soon over weekend.  2. RLE DVT/thrombus in RV/suspect PE: Echo with moderately dilated and moderately  dysfunctional RV.  Clot noted on TEE in RV as well.  - Bivalirudin decreased for PTT goal 40-50 for now with left upper chest hematoma, can hopefully increase back to 50-80 tomorrow.  3. Left PTX: Left lung remains collapsed, will need repositioning of left CT as above.  4. Shock: Suspect septic/distributive.  He is on low dose norepinephrine at 2.  Titrate down as able.  5. Anemia: Hgb 10.5, got 2 units PRBCs overnight in setting of large left upper chest hematoma.  6. AKI: Creatinine up to 1.98 yesterday, now lower at 1.56. Fluid balance ++, hold off on diuresis until tomorrow (hopefully titrate off pressors, improve creatinine).   CRITICAL CARE Performed by:    Total critical care time: 40 minutes  Critical care time was exclusive of separately billable procedures and treating other patients.  Critical care was necessary to treat or prevent imminent or life-threatening deterioration.  Critical care was time spent personally by me on the following activities: development of treatment plan with patient and/or surrogate as well as nursing, discussions with consultants, evaluation of patient's response to treatment, examination of patient, obtaining history from patient or surrogate, ordering and performing treatments and interventions, ordering and review of laboratory studies, ordering and review of radiographic studies, pulse oximetry and re-evaluation of patient's condition.    Length of Stay: 3   , MD  03/12/2020, 7:49 AM  Advanced Heart Failure Team Pager [phone] (M-F; 7a - 4p)  Please contact [facility] for night-coverage after hours (4p -7a ) and weekends on [email or web address]  

## 2020-03-12 NOTE — Procedures (Signed)
Cortrak  Person Inserting Tube:  Renie Ora, RD Tube Type:  Cortrak - 43 inches Tube Location:  Left nare Initial Placement:  Postpyloric Secured by: Bridle Technique Used to Measure Tube Placement:  Documented cm marking at nare/ corner of mouth Cortrak Secured At:  88 cm Procedure Comments:  Cortrak Tube Team Note:  Consult received to place a Cortrak feeding tube.   X-ray is required, abdominal x-ray has been ordered by the Cortrak team. Please confirm tube placement before using the Cortrak tube.   If the tube becomes dislodged please keep the tube and contact the Cortrak team at [email or web address] ([email or web address]) for replacement.  If after hours and replacement cannot be delayed, place a NG tube and confirm placement with an abdominal x-ray.      Trenton Gammon, MS, RD, LDN, CNSC Inpatient Clinical Dietitian RD pager # available in AMION  After hours/weekend pager # available in [facility]

## 2020-03-12 NOTE — Progress Notes (Signed)
ANTICOAGULATION CONSULT NOTE  Pharmacy Consult for Bivalirudin Indication: ECMO + DVTs  No Known Allergies  Patient Measurements: Height: 5\' 4"  (162.6 cm) Weight: 75.9 kg (167 lb 5.3 oz) IBW/kg (Calculated) : 59.2 Heparin Dosing Weight: 72kg  Vital Signs: Temp: 98.24 F (36.8 C) (12/31 0600) Temp Source: Bladder (12/31 0400) BP: 115/64 (12/31 0400) Pulse Rate: 89 (12/31 0600)  Labs: Recent Labs    02/25/2020 1928 03/10/2020 1949 03/10/2020 2112 03/09/2020 2116 03/10/2020 2306 03/12/20 0017 03/12/20 0221 03/12/20 0332 03/12/20 0340 03/12/20 0529 03/12/20 0600 03/12/20 0605  HGB 12.7*   < > 10.6*   < >  --    < >  --  10.5* 9.9* 9.2*  --  8.8*  HCT 37.4*   < > 31.2*   < >  --    < >  --  32.1* 29.0* 27.0*  --  26.0*  PLT 186  --  156  --   --   --   --  143*  --   --   --   --   APTT  --   --   --   --  191*  --  46*  --   --   --  49*  --   LABPROT  --   --  21.8*  --   --   --   --  18.0*  --   --   --   --   INR  --   --  2.0*  --   --   --   --  1.5*  --   --   --   --   CREATININE 1.98*  --  1.81*  --   --   --   --  1.56*  --   --   --   --    < > = values in this interval not displayed.    Estimated Creatinine Clearance: 54.6 mL/min (A) (by C-G formula based on SCr of 1.56 mg/dL (H)).   Medical History: History reviewed. No pertinent past medical history.   Assessment: 65 yoM admitted with COVID-19 PNA with worsening hypoxia now s/p cannulation for ECMO. Pt was started on IV heparin prior to cannulation due to acute DVTs and possible PE, now to transition to bivalirudin.    12/31 AM update #1:  Initial aPTT is supra-therapeutic at 191, drawn correctly Worsening left upper chest wall hematoma per RN Hgb down to 10.2  12/31 AM update #2:  APTT now down to 46 Hematoma more stabilized per RN Hgb 9.9-getting 1 unit blood Per Dr. 1/32, OK to re-start heparin with new aPTT goal of 40-50 secs  12/31 AM update #3:  APTT therapeutic at 49 about 2.5 hours after  re-start  Goal of Therapy:  APTT 40-50 secs per MD Monitor platelets by anticoagulation protocol: Yes   Plan:  Cont Bivalirudin at 0.025 mg/kg/hr Re-check aPTT at 1000  1/32, PharmD, BCPS Clinical Pharmacist Phone: [phone]

## 2020-03-12 NOTE — Progress Notes (Addendum)
Pt received from cath lab following cannulation for VV ECMO. Pt transported from cath lab to 2H23 with bedside RN, ECMO Specialist, Perfusionist and RT. Report received from [facility], perfusionist.  Cardiohelp placed on wall power (red outlet) and wall O2. Sweep and flows verified with perfusionist.

## 2020-03-12 NOTE — Progress Notes (Signed)
ANTICOAGULATION CONSULT NOTE  Pharmacy Consult for Bivalirudin Indication: ECMO + DVTs  No Known Allergies  Patient Measurements: Height: 5\' 4"  (162.6 cm) Weight: 75.9 kg (167 lb 5.3 oz) IBW/kg (Calculated) : 59.2 Heparin Dosing Weight: 72kg  Vital Signs: Temp: 98.42 F (36.9 C) (12/31 1445) Temp Source: Core (12/31 1200) BP: 102/70 (12/31 1400) Pulse Rate: 91 (12/31 1445)  Labs: Recent Labs    02/24/2020 1928 02/19/2020 1949 03/09/2020 2112 02/21/2020 2116 03/12/20 0221 03/12/20 0332 03/12/20 0340 03/12/20 0600 03/12/20 0605 03/12/20 0822 03/12/20 1024 03/12/20 1217 03/12/20 1336  HGB 12.7*   < > 10.6*   < >  --  10.5*   < >  --    < > 9.5*  --  8.2* 9.5*  HCT 37.4*   < > 31.2*   < >  --  32.1*   < >  --    < > 28.0*  --  24.0* 28.0*  PLT 186  --  156  --   --  143*  --   --   --   --   --   --   --   APTT  --   --   --    < > 46*  --   --  49*  --   --  47*  --   --   LABPROT  --   --  21.8*  --   --  18.0*  --   --   --   --   --   --   --   INR  --   --  2.0*  --   --  1.5*  --   --   --   --   --   --   --   CREATININE 1.98*  --  1.81*  --   --  1.56*  --   --   --   --   --   --   --    < > = values in this interval not displayed.    Estimated Creatinine Clearance: 54.6 mL/min (A) (by C-G formula based on SCr of 1.56 mg/dL (H)).   Medical History: History reviewed. No pertinent past medical history.   Assessment: 28 yoM admitted with COVID-19 PNA with worsening hypoxia now s/p cannulation for ECMO. Pt was started on IV heparin prior to cannulation due to acute DVTs and possible PE, now to transition to bivalirudin.   APTT this AM remains in range at 47, hematoma stable.  Goal of Therapy:  APTT 40-50 secs per MD Monitor platelets by anticoagulation protocol: Yes   Plan:  Cont Bivalirudin at 0.025 mg/kg/hr Re-check aPTT at 5 pm.  57, PharmD, BCPS Abran Duke, Reece Leader, BCPS, [facility] Clinical Pharmacist  03/12/2020 3:12 PM   [facility] pharmacy  phone numbers are listed on [email or web address]

## 2020-03-12 NOTE — Progress Notes (Signed)
CRITICAL VALUE ALERT  Critical Value:  PTT 191  Date & Time Notied:  03/12/20 @ 0030  Provider Notified: Dr Denese Killings  Orders Received/Actions taken: order for 1 PRBC, 1 NS bolus. Holding Bival gtt  MD made aware of low flows on ECMO despite several Albumins. Also pt turned to left side to help with flows. Pressure bag to applied to left chest hematoma.

## 2020-03-12 NOTE — Progress Notes (Signed)
ANTICOAGULATION CONSULT NOTE  Pharmacy Consult for Bivalirudin Indication: ECMO + DVTs  No Known Allergies  Patient Measurements: Height: 5\' 4"  (162.6 cm) Weight: 72.1 kg (159 lb) IBW/kg (Calculated) : 59.2 Heparin Dosing Weight: 72kg  Vital Signs: Temp: 97.88 F (36.6 C) (12/31 0200) Temp Source: Bladder (12/31 0110) BP: 118/65 (12/31 0110) Pulse Rate: 83 (12/31 0200)  Labs: Recent Labs    03/10/20 0222 02/24/2020 0518 02/11/2020 1804 03/07/2020 1928 03/08/2020 1949 02/18/2020 2112 02/28/2020 2116 03/06/2020 2306 03/12/20 0017 03/12/20 0221  HGB 14.1 14.8   < > 12.7*   < > 10.6* 10.2*  --  9.9*  --   HCT 40.2 44.2   < > 37.4*   < > 31.2* 30.0*  --  29.5*  --   PLT 302 206  --  186  --  156  --   --   --   --   APTT  --   --   --   --   --   --   --  191*  --  46*  LABPROT  --   --   --   --   --  21.8*  --   --   --   --   INR  --   --   --   --   --  2.0*  --   --   --   --   CREATININE 0.93  --   --  1.98*  --  1.81*  --   --   --   --    < > = values in this interval not displayed.    Estimated Creatinine Clearance: 46 mL/min (A) (by C-G formula based on SCr of 1.81 mg/dL (H)).   Medical History: History reviewed. No pertinent past medical history.   Assessment: 89 yoM admitted with COVID-19 PNA with worsening hypoxia now s/p cannulation for ECMO. Pt was started on IV heparin prior to cannulation due to acute DVTs and possible PE, now to transition to bivalirudin.    12/31 AM update #1:  Initial aPTT is supra-therapeutic at 191, drawn correctly Worsening left upper chest wall hematoma per RN Hgb down to 10.2  12/31 AM update #2:  APTT now down to 46 Hematoma more stabilized per RN Hgb 9.9-getting 1 unit blood Per Dr. 1/32, OK to re-start heparin with new aPTT goal of 40-50 secs  Goal of Therapy:  APTT 40-50 secs per MD Monitor platelets by anticoagulation protocol: Yes   Plan:  Re-start Bivalirudin at 0.025 mg/kg/hr Re-check aPTT at 0700  Denese Killings, PharmD, BCPS Clinical Pharmacist Phone: [phone]

## 2020-03-12 NOTE — Progress Notes (Signed)
PT Cancellation Note  Patient Details Name: Alex [name] MRN: [id number] DOB: 16-Jun-1970   Cancelled Treatment:    Reason Eval/Treat Not Completed: Medical issues which prohibited therapy (Pt not ready for PT per nurse. Will check back on Monday with nurse in agreement.)   Berline Lopes 03/12/2020, 1:22 PM Izzabelle Bouley W,PT Acute Rehabilitation Services Pager:  [phone]  Office:  [phone]

## 2020-03-13 ENCOUNTER — Inpatient Hospital Stay (HOSPITAL_COMMUNITY): Payer: Medicaid Other

## 2020-03-13 DIAGNOSIS — Z7189 Other specified counseling: Secondary | ICD-10-CM

## 2020-03-13 DIAGNOSIS — Z515 Encounter for palliative care: Secondary | ICD-10-CM

## 2020-03-13 LAB — BASIC METABOLIC PANEL
Anion gap: 8 (ref 5–15)
Anion gap: 8 (ref 5–15)
BUN: 47 mg/dL — ABNORMAL HIGH (ref 6–20)
BUN: 46 mg/dL — ABNORMAL HIGH (ref 6–20)
CO2: 23 mmol/L (ref 22–32)
CO2: 25 mmol/L (ref 22–32)
Calcium: 6.9 mg/dL — ABNORMAL LOW (ref 8.9–10.3)
Calcium: 7 mg/dL — ABNORMAL LOW (ref 8.9–10.3)
Chloride: 106 mmol/L (ref 98–111)
Chloride: 110 mmol/L (ref 98–111)
Creatinine, Ser: 0.96 mg/dL (ref 0.61–1.24)
Creatinine, Ser: 0.87 mg/dL (ref 0.61–1.24)
GFR, Estimated: >60 mL/min (ref >60)
GFR, Estimated: >60 mL/min (ref >60)
Glucose, Bld: 362 mg/dL — ABNORMAL HIGH (ref 70–99)
Glucose, Bld: 183 mg/dL — ABNORMAL HIGH (ref 70–99)
Potassium: 4.4 mmol/L (ref 3.5–5.1)
Potassium: 4.3 mmol/L (ref 3.5–5.1)
Sodium: 137 mmol/L (ref 135–145)
Sodium: 143 mmol/L (ref 135–145)

## 2020-03-13 LAB — POCT I-STAT 7, (LYTES, BLD GAS, ICA,H+H)
Acid-Base Excess: 0 mmol/L (ref 0.0–2.0)
Acid-Base Excess: 3 mmol/L — ABNORMAL HIGH (ref 0.0–2.0)
Acid-Base Excess: 0 mmol/L (ref 0.0–2.0)
Acid-Base Excess: 2 mmol/L (ref 0.0–2.0)
Acid-Base Excess: 2 mmol/L (ref 0.0–2.0)
Acid-Base Excess: 4 mmol/L — ABNORMAL HIGH (ref 0.0–2.0)
Acid-base deficit: 2 mmol/L (ref 0.0–2.0)
Bicarbonate: 23.5 mmol/L (ref 20.0–28.0)
Bicarbonate: 25.8 mmol/L (ref 20.0–28.0)
Bicarbonate: 27.5 mmol/L (ref 20.0–28.0)
Bicarbonate: 25.2 mmol/L (ref 20.0–28.0)
Bicarbonate: 27.1 mmol/L (ref 20.0–28.0)
Bicarbonate: 26.3 mmol/L (ref 20.0–28.0)
Bicarbonate: 28.8 mmol/L — ABNORMAL HIGH (ref 20.0–28.0)
Calcium, Ion: 1.06 mmol/L — ABNORMAL LOW (ref 1.15–1.40)
Calcium, Ion: 1.12 mmol/L — ABNORMAL LOW (ref 1.15–1.40)
Calcium, Ion: 1.12 mmol/L — ABNORMAL LOW (ref 1.15–1.40)
Calcium, Ion: 1.09 mmol/L — ABNORMAL LOW (ref 1.15–1.40)
Calcium, Ion: 1.09 mmol/L — ABNORMAL LOW (ref 1.15–1.40)
Calcium, Ion: 1.07 mmol/L — ABNORMAL LOW (ref 1.15–1.40)
Calcium, Ion: 1.08 mmol/L — ABNORMAL LOW (ref 1.15–1.40)
HCT: 23 % — ABNORMAL LOW (ref 39.0–52.0)
HCT: 23 % — ABNORMAL LOW (ref 39.0–52.0)
HCT: 22 % — ABNORMAL LOW (ref 39.0–52.0)
HCT: 22 % — ABNORMAL LOW (ref 39.0–52.0)
HCT: 22 % — ABNORMAL LOW (ref 39.0–52.0)
HCT: 25 % — ABNORMAL LOW (ref 39.0–52.0)
HCT: 20 % — ABNORMAL LOW (ref 39.0–52.0)
Hemoglobin: 7.8 g/dL — ABNORMAL LOW (ref 13.0–17.0)
Hemoglobin: 7.8 g/dL — ABNORMAL LOW (ref 13.0–17.0)
Hemoglobin: 7.5 g/dL — ABNORMAL LOW (ref 13.0–17.0)
Hemoglobin: 7.5 g/dL — ABNORMAL LOW (ref 13.0–17.0)
Hemoglobin: 7.5 g/dL — ABNORMAL LOW (ref 13.0–17.0)
Hemoglobin: 8.5 g/dL — ABNORMAL LOW (ref 13.0–17.0)
Hemoglobin: 6.8 g/dL — CL (ref 13.0–17.0)
O2 Saturation: 99 %
O2 Saturation: 99 %
O2 Saturation: 98 %
O2 Saturation: 90 %
O2 Saturation: 96 %
O2 Saturation: 93 %
O2 Saturation: 100 %
Patient temperature: 36.9
Patient temperature: 36.8
Patient temperature: 36.8
Patient temperature: 36.8
Patient temperature: 36.7
Patient temperature: 36.8
Patient temperature: 36.8
Potassium: 4.3 mmol/L (ref 3.5–5.1)
Potassium: 4.2 mmol/L (ref 3.5–5.1)
Potassium: 4.2 mmol/L (ref 3.5–5.1)
Potassium: 3.9 mmol/L (ref 3.5–5.1)
Potassium: 4 mmol/L (ref 3.5–5.1)
Potassium: 3.6 mmol/L (ref 3.5–5.1)
Potassium: 3.5 mmol/L (ref 3.5–5.1)
Sodium: 136 mmol/L (ref 135–145)
Sodium: 139 mmol/L (ref 135–145)
Sodium: 139 mmol/L (ref 135–145)
Sodium: 143 mmol/L (ref 135–145)
Sodium: 143 mmol/L (ref 135–145)
Sodium: 144 mmol/L (ref 135–145)
Sodium: 146 mmol/L — ABNORMAL HIGH (ref 135–145)
TCO2: 25 mmol/L (ref 22–32)
TCO2: 27 mmol/L (ref 22–32)
TCO2: 29 mmol/L (ref 22–32)
TCO2: 26 mmol/L (ref 22–32)
TCO2: 28 mmol/L (ref 22–32)
TCO2: 27 mmol/L (ref 22–32)
TCO2: 30 mmol/L (ref 22–32)
pCO2 arterial: 42.4 mmHg (ref 32.0–48.0)
pCO2 arterial: 43.8 mmHg (ref 32.0–48.0)
pCO2 arterial: 42.2 mmHg (ref 32.0–48.0)
pCO2 arterial: 41.8 mmHg (ref 32.0–48.0)
pCO2 arterial: 41.2 mmHg (ref 32.0–48.0)
pCO2 arterial: 37.2 mmHg (ref 32.0–48.0)
pCO2 arterial: 40.4 mmHg (ref 32.0–48.0)
pH, Arterial: 7.351 (ref 7.350–7.450)
pH, Arterial: 7.377 (ref 7.350–7.450)
pH, Arterial: 7.422 (ref 7.350–7.450)
pH, Arterial: 7.387 (ref 7.350–7.450)
pH, Arterial: 7.424 (ref 7.350–7.450)
pH, Arterial: 7.456 — ABNORMAL HIGH (ref 7.350–7.450)
pH, Arterial: 7.459 — ABNORMAL HIGH (ref 7.350–7.450)
pO2, Arterial: 142 mmHg — ABNORMAL HIGH (ref 83.0–108.0)
pO2, Arterial: 122 mmHg — ABNORMAL HIGH (ref 83.0–108.0)
pO2, Arterial: 111 mmHg — ABNORMAL HIGH (ref 83.0–108.0)
pO2, Arterial: 58 mmHg — ABNORMAL LOW (ref 83.0–108.0)
pO2, Arterial: 82 mmHg — ABNORMAL LOW (ref 83.0–108.0)
pO2, Arterial: 64 mmHg — ABNORMAL LOW (ref 83.0–108.0)
pO2, Arterial: 158 mmHg — ABNORMAL HIGH (ref 83.0–108.0)

## 2020-03-13 LAB — GLUCOSE, CAPILLARY
Glucose-Capillary: 361 mg/dL — ABNORMAL HIGH (ref 70–99)
Glucose-Capillary: 345 mg/dL — ABNORMAL HIGH (ref 70–99)
Glucose-Capillary: 292 mg/dL — ABNORMAL HIGH (ref 70–99)
Glucose-Capillary: 308 mg/dL — ABNORMAL HIGH (ref 70–99)
Glucose-Capillary: 304 mg/dL — ABNORMAL HIGH (ref 70–99)
Glucose-Capillary: 248 mg/dL — ABNORMAL HIGH (ref 70–99)
Glucose-Capillary: 216 mg/dL — ABNORMAL HIGH (ref 70–99)
Glucose-Capillary: 185 mg/dL — ABNORMAL HIGH (ref 70–99)
Glucose-Capillary: 177 mg/dL — ABNORMAL HIGH (ref 70–99)
Glucose-Capillary: 162 mg/dL — ABNORMAL HIGH (ref 70–99)
Glucose-Capillary: 102 mg/dL — ABNORMAL HIGH (ref 70–99)
Glucose-Capillary: 145 mg/dL — ABNORMAL HIGH (ref 70–99)

## 2020-03-13 LAB — CBC WITH DIFFERENTIAL/PLATELET
Abs Immature Granulocytes: 0.22 10*3/uL — ABNORMAL HIGH (ref 0.00–0.07)
Basophils Absolute: 0 10*3/uL (ref 0.0–0.1)
Basophils Relative: 0 %
Eosinophils Absolute: 0 10*3/uL (ref 0.0–0.5)
Eosinophils Relative: 0 %
HCT: 24.1 % — ABNORMAL LOW (ref 39.0–52.0)
Hemoglobin: 8.9 g/dL — ABNORMAL LOW (ref 13.0–17.0)
Immature Granulocytes: 3 %
Lymphocytes Relative: 6 %
Lymphs Abs: 0.5 10*3/uL — ABNORMAL LOW (ref 0.7–4.0)
MCH: 30.3 pg (ref 26.0–34.0)
MCHC: 36.9 g/dL — ABNORMAL HIGH (ref 30.0–36.0)
MCV: 82 fL (ref 80.0–100.0)
Monocytes Absolute: 0.4 10*3/uL (ref 0.1–1.0)
Monocytes Relative: 5 %
Neutro Abs: 6.6 10*3/uL (ref 1.7–7.7)
Neutrophils Relative %: 86 %
Platelets: 167 10*3/uL (ref 150–400)
RBC: 2.94 MIL/uL — ABNORMAL LOW (ref 4.22–5.81)
RDW: 17.7 % — ABNORMAL HIGH (ref 11.5–15.5)
WBC: 7.6 10*3/uL (ref 4.0–10.5)
nRBC: 0.5 % — ABNORMAL HIGH (ref 0.0–0.2)

## 2020-03-13 LAB — MAGNESIUM: Magnesium: 3.4 mg/dL — ABNORMAL HIGH (ref 1.7–2.4)

## 2020-03-13 LAB — CBC
HCT: 24.9 % — ABNORMAL LOW (ref 39.0–52.0)
HCT: 21.6 % — ABNORMAL LOW (ref 39.0–52.0)
Hemoglobin: 8.3 g/dL — ABNORMAL LOW (ref 13.0–17.0)
Hemoglobin: 7.6 g/dL — ABNORMAL LOW (ref 13.0–17.0)
MCH: 27.6 pg (ref 26.0–34.0)
MCH: 28.4 pg (ref 26.0–34.0)
MCHC: 33.3 g/dL (ref 30.0–36.0)
MCHC: 35.2 g/dL (ref 30.0–36.0)
MCV: 82.7 fL (ref 80.0–100.0)
MCV: 80.6 fL (ref 80.0–100.0)
Platelets: 158 10*3/uL (ref 150–400)
Platelets: 145 10*3/uL — ABNORMAL LOW (ref 150–400)
RBC: 3.01 MIL/uL — ABNORMAL LOW (ref 4.22–5.81)
RBC: 2.68 MIL/uL — ABNORMAL LOW (ref 4.22–5.81)
RDW: 17.4 % — ABNORMAL HIGH (ref 11.5–15.5)
RDW: 17.3 % — ABNORMAL HIGH (ref 11.5–15.5)
WBC: 9.4 10*3/uL (ref 4.0–10.5)
WBC: 8.3 10*3/uL (ref 4.0–10.5)
nRBC: 3.1 % — ABNORMAL HIGH (ref 0.0–0.2)
nRBC: 3.4 % — ABNORMAL HIGH (ref 0.0–0.2)

## 2020-03-13 LAB — APTT
aPTT: 44 seconds — ABNORMAL HIGH (ref 24–36)
aPTT: 45 seconds — ABNORMAL HIGH (ref 24–36)
aPTT: 46 seconds — ABNORMAL HIGH (ref 24–36)
aPTT: 46 seconds — ABNORMAL HIGH (ref 24–36)

## 2020-03-13 LAB — PHOSPHORUS: Phosphorus: 1.8 mg/dL — ABNORMAL LOW (ref 2.5–4.6)

## 2020-03-13 LAB — HEPATIC FUNCTION PANEL
ALT: 39 U/L (ref 0–44)
AST: 55 U/L — ABNORMAL HIGH (ref 15–41)
Albumin: 2.4 g/dL — ABNORMAL LOW (ref 3.5–5.0)
Alkaline Phosphatase: 76 U/L (ref 38–126)
Bilirubin, Direct: 0.1 mg/dL (ref 0.0–0.2)
Indirect Bilirubin: 0.2 mg/dL — ABNORMAL LOW (ref 0.3–0.9)
Total Bilirubin: 0.3 mg/dL (ref 0.3–1.2)
Total Protein: 3.8 g/dL — ABNORMAL LOW (ref 6.5–8.1)

## 2020-03-13 LAB — FERRITIN: Ferritin: 470 ng/mL — ABNORMAL HIGH (ref 24–336)

## 2020-03-13 LAB — LACTIC ACID, PLASMA
Lactic Acid, Venous: 2.1 mmol/L (ref 0.5–1.9)
Lactic Acid, Venous: 2 mmol/L (ref 0.5–1.9)

## 2020-03-13 LAB — PREPARE RBC (CROSSMATCH)

## 2020-03-13 LAB — D-DIMER, QUANTITATIVE: D-Dimer, Quant: 9.61 ug/mL-FEU — ABNORMAL HIGH (ref 0.00–0.50)

## 2020-03-13 LAB — FIBRINOGEN: Fibrinogen: 144 mg/dL — ABNORMAL LOW (ref 210–475)

## 2020-03-13 LAB — PROTIME-INR
INR: 1.5 — ABNORMAL HIGH (ref 0.8–1.2)
Prothrombin Time: 17.3 seconds — ABNORMAL HIGH (ref 11.4–15.2)

## 2020-03-13 LAB — LACTATE DEHYDROGENASE: LDH: 543 U/L — ABNORMAL HIGH (ref 98–192)

## 2020-03-13 LAB — C-REACTIVE PROTEIN: CRP: <0.5 mg/dL (ref <1.0)

## 2020-03-13 LAB — TRIGLYCERIDES: Triglycerides: 1460 mg/dL — ABNORMAL HIGH (ref <150)

## 2020-03-13 MED ORDER — SODIUM CHLORIDE 0.9 % IV SOLN
0.2500 mg/kg/h | INTRAVENOUS | Status: DC
  Administered 2020-03-15: 07:00:00 0.14 mg/kg/h via INTRAVENOUS
  Administered 2020-03-16 (×2): 0.23 mg/kg/h via INTRAVENOUS
  Administered 2020-03-17: 0.3 mg/kg/h via INTRAVENOUS
  Administered 2020-03-18: 0.25 mg/kg/h via INTRAVENOUS
  Filled 2020-03-13 (×10): qty 250

## 2020-03-13 MED ORDER — POTASSIUM PHOSPHATES 15 MMOLE/5ML IV SOLN
20.0000 mmol | Freq: Once | INTRAVENOUS | Status: AC
  Administered 2020-03-13: 20 mmol via INTRAVENOUS
  Filled 2020-03-13: qty 6.67

## 2020-03-13 MED ORDER — SODIUM CHLORIDE 0.9% IV SOLUTION: Freq: Once | INTRAVENOUS | Status: AC

## 2020-03-13 MED ORDER — SODIUM CHLORIDE 0.9 % IV SOLN
0.0350 mg/kg/h | INTRAVENOUS | Status: DC
  Administered 2020-03-13: 0.03 mg/kg/h via INTRAVENOUS
  Filled 2020-03-13: qty 250

## 2020-03-13 MED ORDER — DEXTROSE 50 % IV SOLN
0.0000 mL | INTRAVENOUS | Status: DC | PRN
Start: 2020-03-13 — End: 2020-03-28
  Administered 2020-03-18 (×2): 30 mL via INTRAVENOUS
  Administered 2020-03-21: 25 mL via INTRAVENOUS
  Administered 2020-03-21 – 2020-03-23 (×4): 50 mL via INTRAVENOUS
  Filled 2020-03-13 (×6): qty 50

## 2020-03-13 MED ORDER — FUROSEMIDE 10 MG/ML IJ SOLN
40.0000 mg | Freq: Once | INTRAMUSCULAR | Status: AC
  Administered 2020-03-13: 40 mg via INTRAVENOUS
  Filled 2020-03-13: qty 4

## 2020-03-13 MED ORDER — DEXMEDETOMIDINE HCL IN NACL 400 MCG/100ML IV SOLN
0.1000 ug/kg/h | INTRAVENOUS | Status: DC
  Administered 2020-03-13: 0.8 ug/kg/h via INTRAVENOUS
  Administered 2020-03-13: 09:00:00 0.4 ug/kg/h via INTRAVENOUS
  Administered 2020-03-14: 07:00:00 1.1 ug/kg/h via INTRAVENOUS
  Administered 2020-03-14: 1.2 ug/kg/h via INTRAVENOUS
  Administered 2020-03-14: 21:00:00 0.9 ug/kg/h via INTRAVENOUS
  Administered 2020-03-14: 1.2 ug/kg/h via INTRAVENOUS
  Administered 2020-03-15: 0.4 ug/kg/h via INTRAVENOUS
  Administered 2020-03-15: 0.5 ug/kg/h via INTRAVENOUS
  Administered 2020-03-16: 20:00:00 0.3 ug/kg/h via INTRAVENOUS
  Administered 2020-03-16 – 2020-03-17 (×3): 0.8 ug/kg/h via INTRAVENOUS
  Administered 2020-03-18: 0.7 ug/kg/h via INTRAVENOUS
  Administered 2020-03-18: 1 ug/kg/h via INTRAVENOUS
  Administered 2020-03-18: 0.7 ug/kg/h via INTRAVENOUS
  Administered 2020-03-19: 0.8 ug/kg/h via INTRAVENOUS
  Administered 2020-03-19 (×2): 1 ug/kg/h via INTRAVENOUS
  Filled 2020-03-13 (×20): qty 100

## 2020-03-13 MED ORDER — INSULIN REGULAR(HUMAN) IN NACL 100-0.9 UT/100ML-% IV SOLN
INTRAVENOUS | Status: DC
  Administered 2020-03-13: 20 [IU]/h via INTRAVENOUS
  Administered 2020-03-13: 9 [IU]/h via INTRAVENOUS
  Administered 2020-03-14: 1.4 [IU]/h via INTRAVENOUS
  Administered 2020-03-14: 10.5 [IU]/h via INTRAVENOUS
  Administered 2020-03-15: 3.4 [IU]/h via INTRAVENOUS
  Administered 2020-03-16: 9.5 [IU]/h via INTRAVENOUS
  Administered 2020-03-16: 17 [IU]/h via INTRAVENOUS
  Administered 2020-03-17: 8 [IU]/h via INTRAVENOUS
  Administered 2020-03-17: 13 [IU]/h via INTRAVENOUS
  Administered 2020-03-19: 14 [IU]/h via INTRAVENOUS
  Administered 2020-03-19: 11 [IU]/h via INTRAVENOUS
  Administered 2020-03-19: 8 [IU]/h via INTRAVENOUS
  Administered 2020-03-20: 6 [IU]/h via INTRAVENOUS
  Filled 2020-03-13 (×12): qty 100

## 2020-03-13 MED ORDER — AMLODIPINE BESYLATE 5 MG PO TABS: 5.0000 mg | ORAL_TABLET | Freq: Every day | ORAL | Status: DC

## 2020-03-13 MED ORDER — HYDRALAZINE HCL 20 MG/ML IJ SOLN
10.0000 mg | INTRAMUSCULAR | Status: DC | PRN
  Administered 2020-03-13 – 2020-05-09 (×21): 10 mg via INTRAVENOUS
  Filled 2020-03-13 (×21): qty 1

## 2020-03-13 NOTE — Progress Notes (Addendum)
ANTICOAGULATION CONSULT NOTE  Pharmacy Consult for Bivalirudin Indication: ECMO + DVTs  No Known Allergies  Patient Measurements: Height: 5\' 4"  (162.6 cm) Weight: 74.6 kg (164 lb 7.4 oz) IBW/kg (Calculated) : 59.2 Heparin Dosing Weight: 72kg  Vital Signs: Temp: 98.42 F (36.9 C) (01/01 0751) BP: 107/68 (01/01 0751) Pulse Rate: 78 (01/01 0751)  Labs: Recent Labs    02/26/2020 2112 02/15/2020 2116 03/12/20 0332 03/12/20 0340 03/12/20 1024 03/12/20 1217 03/12/20 1605 03/12/20 1613 03/12/20 2026 03/13/20 0314 03/13/20 0324  HGB 10.6*   < > 10.5*   < >  --    < > 9.4*   < > 7.8* 8.9* 7.8*  HCT 31.2*   < > 32.1*   < >  --    < > 28.2*   < > 23.0* 24.1* 23.0*  PLT 156  --  143*  --   --   --  156  --   --  167  --   APTT  --    < >  --    < > 47*  --  47*  --   --  44*  --   LABPROT 21.8*  --  18.0*  --   --   --   --   --   --  17.3*  --   INR 2.0*  --  1.5*  --   --   --   --   --   --  1.5*  --   CREATININE 1.81*  --  1.56*  --   --   --  1.27*  --   --  0.96  --    < > = values in this interval not displayed.    Estimated Creatinine Clearance: 88 mL/min (by C-G formula based on SCr of 0.96 mg/dL).   Medical History: History reviewed. No pertinent past medical history.   Assessment: 76 yoM admitted with COVID-19 PNA with worsening hypoxia now s/p cannulation for ECMO. Pt was started on IV heparin prior to cannulation due to acute DVTs and possible PE, thrombus in RV, now to transition to bivalirudin.   APTT this morning within established goal range at 44 seconds. Hematoma improving per RN and CBC is stable.  Developing some fibrin at corners of oxygenator per RNs report.  Goal of Therapy:  APTT 50-80 secs per MD Monitor platelets by anticoagulation protocol: Yes   Plan:  Discussed with Drs. McLean and Agarwala, will increase aPTT goal to 50-80 since bleeding has stabilized. Increase bivalirudin to 0.03 mg/kg/hr Recheck aPTT in 2 hrs. Daily aPTT, CBC.  57, Reece Leader, BCPS, [facility] Clinical Pharmacist  03/13/2020 8:02 AM   Addendum: APTT about 2 hrs after rate increase = 44.  Will further increase bivalirudin to 0.035 mg/kg/hr.  05/11/2020, Reece Leader, BCCP Clinical Pharmacist  03/13/2020 1:17 PM   [facility] pharmacy phone numbers are listed on [email or web address]

## 2020-03-13 NOTE — Progress Notes (Signed)
RT NOTES: MD switched patient's mode to PS/CPAP 10/5. Sats 82%, RR 40, BP 201/101. Placed back on full support at this time.

## 2020-03-13 NOTE — Progress Notes (Addendum)
   Palliative Medicine Inpatient Follow Up Note  HPI:50 y.o. male  with no significant past medical history admitted on 2020/03/23 with dyspnea, cough, nausea/vomiting ~ 1 week ago with worsening symptoms of body aches and fatigue and +COVID 02/07/20 and admitted with shortness of breath. Required intubation 03/10/20. Cannulated for VV ECMO 02/24/2020. Oxygenating better with ECMO. Also evidence of RLE DVT, RV thrombus, and high suspicion of PE. Has required chest tube to L lung for collapse which needs further reposition with recurrent collapse.   Today's Discussion (03/13/2020): Chart reviewed.   Attended ECMO rounds this morning.   Patient is able to follow commands per nursing.   Per Dr. Denese Killings plan to extubate today given patients stable cognitive state. Left pneumothorax with improvement s/p chest tube. Remains on CTX, axithro, and prednisone. Remains on an insulin gtt. Has been weaned off of norephinephrine.   Patients son, Sharia Reeve called and updated over the phone. He requests an E-Link visit which I shared his RN, Maralyn Sago could help coordinate. He states that he does not have any pressing questions right now.   Discussed the importance of continued conversation with family and their  medical providers regarding overall plan of care and treatment options, ensuring decisions are within the context of the patients values and GOCs.  Questions and concerns addressed   Objective Assessment: Vital Signs Vitals:   03/13/20 1100 03/13/20 1121  BP: 100/66 113/72  Pulse: 76 80  Resp: 20 (!) 21  Temp: 98.42 F (36.9 C) 98.42 F (36.9 C)  SpO2: 92% 91%    Intake/Output Summary (Last 24 hours) at 03/13/2020 1158 Last data filed at 03/13/2020 1130 Gross per 24 hour  Intake 4293.25 ml  Output 2730 ml  Net 1563.25 ml   Last Weight  Most recent update: 03/13/2020 12:53 AM   Weight  74.6 kg (164 lb 7.4 oz)           Physical Exam Vitals and nursing note reviewed.  Constitutional:       General: He is not in acute distress. Cardiovascular:     Rate and Rhythm: Normal rate.  Pulmonary:     Effort: No tachypnea, accessory muscle usage or respiratory distress.     Comments: Sedated on vent; L chest tube with serous drainage VV ECMO Abdominal:     General: Abdomen is flat.  Neurological:     Comments: Sedated   SUMMARY OF RECOMMENDATIONS   Full Code / Full Scope of treatment  ECMO as a bridge to recovery  Ongoing Palliative care support  Spiritual Support - Christian  Time Spent: 25 Greater than 50% of the time was spent in counseling and coordination of care ______________________________________________________________________________________ [facility] Health Palliative Medicine Team Team Cell Phone: [phone] Please utilize secure chat with additional questions, if there is no response within 30 minutes please call the above phone number  Palliative Medicine Team providers are available by phone from 7am to 7pm daily and can be reached through the team cell phone.  Should this patient require assistance outside of these hours, please call the patient's attending physician.

## 2020-03-13 NOTE — Progress Notes (Signed)
Assisted family with video chat. 

## 2020-03-13 NOTE — Progress Notes (Addendum)
Patient ID: Alex [name], male   DOB: February 24, 1971, 50 y.o.   MRN: [id number]     Advanced Heart Failure Rounding Note  PCP-Cardiologist: No primary care provider on file.   Subjective:    - 12/30: VV ECMO cannulation - 12/31: Left chest tube replaced  CXR with bibasilar infiltrates, re-expanded left lung.    Bivalirudin gtt PTT 44  Creatinine down to 0.96, I/Os even.    Patient remains on ceftriaxone/azithromycin.   Patient is awake/alert on vent this morning. Off Propofol, only getting Fentanyl.   ECMO parameters: 2850 rpm Flow 3.23 L/min Pvenous -48 Delta P 13 Sweep 4 ABG 7.38/44/122/99% LDH 410 => 543 Lactate 2.1  Objective:   Weight Range: 74.6 kg Body mass index is 28.23 kg/m.   Vital Signs:   Temp:  [98.06 F (36.7 C)-98.42 F (36.9 C)] 98.42 F (36.9 C) (01/01 0700) Pulse Rate:  [75-95] 78 (01/01 0700) Resp:  [0-25] 20 (01/01 0700) BP: (88-154)/(59-98) 107/68 (01/01 0700) SpO2:  [93 %-100 %] 99 % (01/01 0700) Arterial Line BP: (110-273)/(12-117) 131/62 (01/01 0700) FiO2 (%):  [40 %] 40 % (01/01 0600) Weight:  [74.6 kg] 74.6 kg (01/01 0045) Last BM Date: 03/12/20  Weight change: Filed Weights   02/29/2020 0556 03/12/20 0500 03/13/20 0045  Weight: 72.1 kg 75.9 kg 74.6 kg    Intake/Output:   Intake/Output Summary (Last 24 hours) at 03/13/2020 0745 Last data filed at 03/13/2020 0700 Gross per 24 hour  Intake 2864.23 ml  Output 2675 ml  Net 189.23 ml      Physical Exam    General: Intubated/awake Neck: No JVD, no thyromegaly or thyroid nodule.  Lungs: Decreased at bases. CV: Nondisplaced PMI.  Heart regular S1/S2, no S3/S4, no murmur.  No peripheral edema.  Abdomen: Soft, nontender, no hepatosplenomegaly, no distention.  Skin: Intact without lesions or rashes.  Neurologic: Awake on vent, follows commands.  Extremities: No clubbing or cyanosis.  HEENT: Normal.    Telemetry   NSR 90s (personally reviewed)  Labs    CBC Recent Labs     03/12/20 0332 03/12/20 0340 03/12/20 1605 03/12/20 1613 03/13/20 0314 03/13/20 0324  WBC 7.1  --  9.0  --  7.6  --   NEUTROABS 6.1  --   --   --  6.6  --   HGB 10.5*   < > 9.4*   < > 8.9* 7.8*  HCT 32.1*   < > 28.2*   < > 24.1* 23.0*  MCV 83.2  --  82.7  --  82.0  --   PLT 143*  --  156  --  167  --    < > = values in this interval not displayed.   Basic Metabolic Panel Recent Labs    03/12/20 1605 03/12/20 1613 03/13/20 0314 03/13/20 0324  NA 137   < > 137 139  K 4.4   < > 4.4 4.2  CL 107  --  106  --   CO2 23  --  23  --   GLUCOSE 382*  --  362*  --   BUN 49*  --  47*  --   CREATININE 1.27*  --  0.96  --   CALCIUM 6.7*  --  6.9*  --   MG 2.8*  --  3.4*  --   PHOS 3.3  --  1.8*  --    < > = values in this interval not displayed.   Liver Function Tests Recent Labs  03/12/20 0332 03/13/20 0314  AST 53* 55*  ALT 38 39  ALKPHOS 68 76  BILITOT 0.5 0.3  PROT 3.9* 3.8*  ALBUMIN 2.4* 2.4*   No results for input(s): LIPASE, AMYLASE in the last 72 hours. Cardiac Enzymes No results for input(s): CKTOTAL, CKMB, CKMBINDEX, TROPONINI in the last 72 hours.  BNP: BNP (last 3 results) No results for input(s): BNP in the last 8760 hours.  ProBNP (last 3 results) No results for input(s): PROBNP in the last 8760 hours.   D-Dimer Recent Labs    03/12/20 0332 03/13/20 0314  DDIMER >20.00* 9.61*   Hemoglobin A1C No results for input(s): HGBA1C in the last 72 hours. Fasting Lipid Panel Recent Labs    03/13/20 0314  TRIG 1,460*   Thyroid Function Tests No results for input(s): TSH, T4TOTAL, T3FREE, THYROIDAB in the last 72 hours.  Invalid input(s): FREET3  Other results:   Imaging    DG CHEST PORT 1 VIEW  Result Date: 03/12/2020 CLINICAL DATA:  Chest tube placement. EXAM: PORTABLE CHEST 1 VIEW COMPARISON:  Same day. FINDINGS: Stable cardiomediastinal silhouette. Endotracheal and nasogastric tubes are unchanged in position. Left internal jugular  catheter is unchanged. Left-sided chest tube has been redirected with tip in left lung apex. No definite pneumothorax is noted. Stable bilateral lung opacities are noted concerning for multifocal pneumonia. Bony thorax is unremarkable. IMPRESSION: Left-sided chest tube has been redirected with tip in left lung apex. No definite pneumothorax is noted. Stable bilateral lung opacities are noted concerning for multifocal pneumonia. Electronically Signed   By: Marijo Conception M.D.   On: 03/12/2020 09:11   DG Abd Portable 1V  Result Date: 03/12/2020 CLINICAL DATA:  Feeding tube placement. EXAM: PORTABLE ABDOMEN - 1 VIEW COMPARISON:  None. FINDINGS: The bowel gas pattern is normal. Feeding tube is seen with distal tip in expected position of the proximal jejunum. No radio-opaque calculi or other significant radiographic abnormality are seen. IMPRESSION: Feeding tube tip seen in expected position of proximal jejunum. Electronically Signed   By: Marijo Conception M.D.   On: 03/12/2020 11:28     Medications:     Scheduled Medications: . artificial tears  1 application Both Eyes Z6X  . chlorhexidine gluconate (MEDLINE KIT)  15 mL Mouth Rinse BID  . Chlorhexidine Gluconate Cloth  6 each Topical Daily  . free water  200 mL Per Tube Q6H  . insulin aspart  0-20 Units Subcutaneous Q4H  . insulin aspart  8 Units Subcutaneous Q4H  . insulin glargine  25 Units Subcutaneous BID  . insulin starter kit- pen needles  1 kit Other Once  . living well with diabetes book   Does not apply Once  . mouth rinse  15 mL Mouth Rinse 10 times per day  . pantoprazole (PROTONIX) IV  40 mg Intravenous QHS  . predniSONE  50 mg Oral Daily  . propranolol  40 mg Per Tube TID  . sodium chloride flush  10-40 mL Intracatheter Q12H    Infusions: . sodium chloride    . sodium chloride 10 mL/hr at 03/13/20 0700  . sodium chloride Stopped (03/12/20 0310)  . sodium chloride Stopped (03/12/20 0131)  . albumin human Stopped (03/12/20  1636)  . azithromycin Stopped (03/12/20 1229)  . bivalirudin (ANGIOMAX) infusion 0.5 mg/mL (Non-ACS indications)    . cefTRIAXone (ROCEPHIN)  IV Stopped (03/12/20 1030)  . feeding supplement (PIVOT 1.5 CAL) 1,000 mL (03/12/20 1224)  . fentaNYL infusion INTRAVENOUS 200 mcg/hr (03/13/20 0743)  .  lactated ringers Stopped (02/25/2020 1522)  . norepinephrine (LEVOPHED) Adult infusion Stopped (03/12/20 1156)  . propofol (DIPRIVAN) infusion 15 mcg/kg/min (03/13/20 0700)  . remdesivir 100 mg in NS 100 mL Stopped (03/12/20 1111)  . vecuronium (NORCURON) infusion 158m/100mL (1 mg/mL) Stopped (03/12/20 1055)    PRN Medications: Place/Maintain arterial line **AND** sodium chloride, sodium chloride, sodium chloride, acetaminophen, albumin human, chlorpheniramine-HYDROcodone, dextrose, fentaNYL, labetalol, ondansetron (ZOFRAN) IV, sodium chloride flush, vecuronium   Assessment/Plan   1. Acute hypoxemic respiratory failure: Due to COVID-19 PNA with bilateral infiltrates.  Refractory hypoxemia, VV-ECMO cannulation on 03/08/2020 with improvement in oxygenation.  Developed left PTX post-subclavian CVL and has left chest tube, the left lung is re-expanded.  CXR with bibasilar infiltrates. LDH higher this morning.  - ECMO circuit functioning appropriately, excellent ABG this morning.   - Increase bivalirudin goal to PTT 50-80.  - Patient has had remdesivir, tocilizumab. - Ongoing steroids with prednisone.  - Empiric antibiotics => azithromycin/ceftriaxone.  - Aiming for extubation today.   2. RLE DVT/thrombus in RV/suspect PE: Echo with moderately dilated and moderately dysfunctional RV.  Clot noted on TEE in RV as well.  - Increase bivalirudin for goal PTT 50-80.  - Echo to reassess RV tomorrow.   3. Left PTX: Left chest tube, lung is re-expanded.   4. Shock: Suspect septic/distributive.  Now resolved, off NE.  5. Anemia: Hgb 8.9, transfuse < 8.  6. AKI: Resolving, creatinine down to 0.96.  I/Os even.  7.  Hyperglycemia: Insulin gtt today.   CRITICAL CARE Performed by: DLoralie Champagne Total critical care time: 40 minutes  Critical care time was exclusive of separately billable procedures and treating other patients.  Critical care was necessary to treat or prevent imminent or life-threatening deterioration.  Critical care was time spent personally by me on the following activities: development of treatment plan with patient and/or surrogate as well as nursing, discussions with consultants, evaluation of patient's response to treatment, examination of patient, obtaining history from patient or surrogate, ordering and performing treatments and interventions, ordering and review of laboratory studies, ordering and review of radiographic studies, pulse oximetry and re-evaluation of patient's condition.    Length of Stay: 4  DLoralie Champagne MD  03/13/2020, 7:45 AM  Advanced Heart Failure Team Pager [phone](M-F; 7a - 4p)  Please contact [email or web address] for night-coverage after hours (4p -7a ) and weekends on [email or web address]

## 2020-03-13 NOTE — Progress Notes (Addendum)
ANTICOAGULATION CONSULT NOTE  Pharmacy Consult for Bivalirudin Indication: ECMO + DVTs  No Known Allergies  Patient Measurements: Height: 5\' 4"  (162.6 cm) Weight: 74.6 kg (164 lb 7.4 oz) IBW/kg (Calculated) : 59.2 Heparin Dosing Weight: 72kg  Vital Signs: Temp: 98.42 F (36.9 C) (01/01 1600) Temp Source: Core (01/01 1600) BP: 118/70 (01/01 1600) Pulse Rate: 74 (01/01 1600)  Labs: Recent Labs    2020-03-20 2112 Mar 20, 2020 2116 03/12/20 0332 03/12/20 0340 03/12/20 1605 03/12/20 1613 03/13/20 0314 03/13/20 0324 03/13/20 0804 03/13/20 0946 03/13/20 1126 03/13/20 1626  HGB 10.6*   < > 10.5*   < > 9.4*   < > 8.9*   < > 7.5*  --  7.5* 8.3*  HCT 31.2*   < > 32.1*   < > 28.2*   < > 24.1*   < > 22.0*  --  22.0* 24.9*  PLT 156  --  143*  --  156  --  167  --   --   --   --  158  APTT  --    < >  --    < > 47*  --  44*  --   --  45*  --  46*  LABPROT 21.8*  --  18.0*  --   --   --  17.3*  --   --   --   --   --   INR 2.0*  --  1.5*  --   --   --  1.5*  --   --   --   --   --   CREATININE 1.81*  --  1.56*  --  1.27*  --  0.96  --   --   --   --  0.87   < > = values in this interval not displayed.    Estimated Creatinine Clearance: 97.1 mL/min (by C-G formula based on SCr of 0.87 mg/dL).   Medical History: History reviewed. No pertinent past medical history.   Assessment: 26 yoM admitted with COVID-19 PNA with worsening hypoxia now s/p cannulation for ECMO. Pt was started on IV heparin prior to cannulation due to acute DVTs and possible PE, thrombus in RV, now to transition to bivalirudin.   APTT up to 46 on bivalirudin 0.035 mg/kg/hr  Goal of Therapy:  APTT 50-80 secs per MD Monitor platelets by anticoagulation protocol: Yes   Plan:  Increase bivalirudin to 0.04 mg/kg/hr Recheck aPTT in 4 hrs. Daily aPTT, CBC.  57, PharmD Clinical Pharmacist **Pharmacist phone directory can now be found on [email or web address] ([email or web address]).  Listed under [facility]  Pharmacy.   Addendum -aPTT remains at 46 after increase to 0.4 (lab drawn ~ 2.5 hrs after last rate change) -no further increase and aPTT may increase further with time -aPTT in am  [facility], PharmD Clinical Pharmacist **Pharmacist phone directory can now be found on [email or web address] ([email or web address]).  Listed under [facility].

## 2020-03-13 NOTE — Procedures (Signed)
Extracorporeal support note   ECLS support day: 3 Indication: Severe respiratory failure secondary to COVID-19 pneumonia with RV dysfunction  Configuration: Venovenous  Drainage cannula: 32 French crescent cannula via right IJ Return cannula: Same  Pump speed: 3.42 RPM Pump flow: 2.95 L/min Pump used: Cardio help  Oxygenator: Cardio help O2 blender: 100% Sweep gas: 4   Circuit check: No clots Anticoagulant: Bivalirudin Anticoagulation targets: PTT 50-80  Changes in support: Continue current level of support.  Support requirements have not increased as patient has become more awake.  Will attempt extubation.  Anticipated goals/duration of support: Bridge to recovery.  Lynnell Catalan, MD [facility] ICU Physician [facility] [place] [facility]  Pager: [phone] Mobile: [phone] After hours: [phone].  03/13/2020, 4:07 PM

## 2020-03-13 NOTE — Plan of Care (Signed)
  Problem: Coping: Goal: Psychosocial and spiritual needs will be supported Outcome: Progressing   Problem: Respiratory: Goal: Will maintain a patent airway Outcome: Progressing   Problem: Respiratory: Goal: Ability to maintain a clear airway and adequate ventilation will improve Outcome: Progressing   Problem: Role Relationship: Goal: Method of communication will improve Outcome: Progressing

## 2020-03-13 NOTE — Progress Notes (Signed)
ECMO PROGRESS NOTE  NAME:  Alex [name], MRN:  [id number], DOB:  10/02/1970, LOS: 4 ADMISSION DATE:  03/11/2020, CONSULTATION DATE: 2020-03-13 REFERRING MD: Wynona Neat -LBPCCM, CHIEF COMPLAINT: Respiratory failure requiring ECMO  HPI/course in hospital  50 year old man admitted to hospital 12/28 with 1 week history of dyspnea cough nausea and vomiting.  Initially admitted to [facility] long hospital and placed on high flow nasal cannula but rapidly failed and required intubation 12/29.  Persistent hypoxic respiratory failure with PF ratio 55 in spite of 18 of PEEP FiO2 0.1 despite paralytics.  Did not improve with prone ventilation  Cannulated for VV ECMO 12/30 via right IJ crescent cannula. Iatrogenic pneumothorax from left subclavian triple-lumen placement  Family confirms no significant past medical history apart from possible prediabetes  Past Medical History  History reviewed. No pertinent past medical history.   Past Surgical History:  Procedure Laterality Date  . [place] teeth      Interim history/subjective:  Chatter has resolved, no longer volume seeking.  Now hypertensive.  Awake and following commands intermittently  Objective   Blood pressure 115/76, pulse 84, temperature 98.42 F (36.9 C), resp. rate (!) 21, height 5\' 4"  (1.626 m), weight 74.6 kg, SpO2 96 %. CVP:  [6 mmHg-11 mmHg] 10 mmHg  Vent Mode: PCV FiO2 (%):  [40 %] 40 % Set Rate:  [20 bmp] 20 bmp PEEP:  [10 cmH20] 10 cmH20 Plateau Pressure:  [19 cmH20-20 cmH20] 19 cmH20   Intake/Output Summary (Last 24 hours) at 03/13/2020 1609 Last data filed at 03/13/2020 1500 Gross per 24 hour  Intake 3851.81 ml  Output 2565 ml  Net 1286.81 ml   Filed Weights   03/05/2020 0556 03/12/20 0500 03/13/20 0045  Weight: 72.1 kg 75.9 kg 74.6 kg    ECMO Device: Cardiohelp  ECMO Mode: VV  Flow (LPM): 3.42   Examination:  Physical Exam Constitutional:      Appearance: Normal appearance. He is normal weight.      Interventions: He is sedated and intubated.  HENT:     Head: Normocephalic and atraumatic.     Comments: Right IJ crescent cannula sutured in place. Eyes:     Conjunctiva/sclera: Conjunctivae normal.  Neck:     Trachea: Trachea normal.     Comments: Orally intubated Cardiovascular:     Rate and Rhythm: Normal rate and regular rhythm.     Pulses: Normal pulses.     Heart sounds: Normal heart sounds.  Pulmonary:     Effort: He is intubated.  Apnea on SBT this morning    Breath sounds: Rales present. No decreased breath sounds, wheezing or rhonchi.     Comments: At the bases bilaterally Chest:     Comments: Left chest tube in place.  Nonfluctuant firmness overlying left infraclavicular area Abdominal:     General: Abdomen is flat. Bowel sounds are normal.     Palpations: Abdomen is soft.     Tenderness: There is no abdominal tenderness.  Genitourinary:    Comments: Foley catheter in place Musculoskeletal:     Right lower leg: No edema.     Left lower leg: No edema.  Neurological:     Mental Status: He is somnolent and will follow commands.  Periods of inattention    GCS: 10 T   Ancillary tests (personally reviewed)  CBC: Recent Labs  Lab 02/21/2020 0830 03/10/20 0222 03/13/20 0518 March 13, 2020 1804 2020/03/13 1928 03-13-20 1949 03/13/20 2112 Mar 13, 2020 2116 03/12/20 0332 03/12/20 0340 03/12/20 1605 03/12/20  1613 03/12/20 2026 03/13/20 0314 03/13/20 0324 03/13/20 0804 03/13/20 1126  WBC 6.3 6.4 5.0  --  6.6  --  6.7  --  7.1  --  9.0  --   --  7.6  --   --   --   NEUTROABS 5.9 5.9 4.4  --   --   --   --   --  6.1  --   --   --   --  6.6  --   --   --   HGB 15.7 14.1 14.8   < > 12.7*   < > 10.6*   < > 10.5*   < > 9.4*   < > 7.8* 8.9* 7.8* 7.5* 7.5*  HCT 45.2 40.2 44.2   < > 37.4*   < > 31.2*   < > 32.1*   < > 28.2*   < > 23.0* 24.1* 23.0* 22.0* 22.0*  MCV 83.7 83.2 86.2  --  85.0  --  86.2  --  83.2  --  82.7  --   --  82.0  --   --   --   PLT 259 302 206  --  186  --  156   --  143*  --  156  --   --  167  --   --   --    < > = values in this interval not displayed.    Basic Metabolic Panel: Recent Labs  Lab 03/10/20 1849 03/01/2020 0518 02/14/2020 1804 02/11/2020 1928 02/14/2020 1949 03/06/2020 2112 03/02/2020 2116 03/12/20 0332 03/12/20 0340 03/12/20 1605 03/12/20 1613 03/12/20 2026 03/13/20 0314 03/13/20 0324 03/13/20 0804 03/13/20 1126  NA  --   --    < > 128*   < > 132*   < > 134*   < > 137   < > 136 137 139 139 143  K  --   --    < > 4.7   < > 4.2   < > 4.1   < > 4.4   < > 4.3 4.4 4.2 4.2 3.9  CL  --   --   --  97*  --  101  --  104  --  107  --   --  106  --   --   --   CO2  --   --   --  19*  --  20*  --  22  --  23  --   --  23  --   --   --   GLUCOSE  --   --   --  327*  --  290*  --  319*  --  382*  --   --  362*  --   --   --   BUN  --   --   --  54*  --  51*  --  52*  --  49*  --   --  47*  --   --   --   CREATININE  --   --   --  1.98*  --  1.81*  --  1.56*  --  1.27*  --   --  0.96  --   --   --   CALCIUM  --   --   --  6.3*  --  6.1*  --  6.6*  --  6.7*  --   --  6.9*  --   --   --  MG 2.8* 3.3*  --   --   --  2.9*  --   --   --  2.8*  --   --  3.4*  --   --   --   PHOS 4.2 4.3  --   --   --  3.5  --   --   --  3.3  --   --  1.8*  --   --   --    < > = values in this interval not displayed.   GFR: Estimated Creatinine Clearance: 88 mL/min (by C-G formula based on SCr of 0.96 mg/dL). Recent Labs  Lab 02/15/2020 0830 03/10/20 0222 02/20/2020 2112 03/12/20 0332 03/12/20 1605 03/12/20 1616 03/13/20 0314  PROCALCITON 18.38  --   --   --   --   --   --   WBC 6.3   < > 6.7 7.1 9.0  --  7.6  LATICACIDVEN 1.4   < > 1.8 1.2  --  1.7 2.1*   < > = values in this interval not displayed.    Liver Function Tests: Recent Labs  Lab 03/05/2020 1810 03/10/20 0222 02/14/2020 2112 03/12/20 0332 03/13/20 0314  AST 96* 117* 63* 53* 55*  ALT 70* 76* 48* 38 39  ALKPHOS 94 92 96 68 76  BILITOT 1.3* 0.7 0.5 0.5 0.3  PROT 7.5 6.4* 4.1* 3.9* 3.8*   ALBUMIN 3.4* 3.0* 2.2* 2.4* 2.4*   No results for input(s): LIPASE, AMYLASE in the last 168 hours. No results for input(s): AMMONIA in the last 168 hours.  ABG    Component Value Date/Time   PHART 7.387 03/13/2020 1126   PCO2ART 41.8 03/13/2020 1126   PO2ART 58 (L) 03/13/2020 1126   HCO3 25.2 03/13/2020 1126   TCO2 26 03/13/2020 1126   ACIDBASEDEF 2.0 03/12/2020 2026   O2SAT 90.0 03/13/2020 1126     Coagulation Profile: Recent Labs  Lab 02/18/2020 2112 03/12/20 0332 03/13/20 0314  INR 2.0* 1.5* 1.5*    Cardiac Enzymes: No results for input(s): CKTOTAL, CKMB, CKMBINDEX, TROPONINI in the last 168 hours.  HbA1C: Hgb A1c MFr Bld  Date/Time Value Ref Range Status  02/22/2020 06:10 PM 11.8 (H) 4.8 - 5.6 % Final    Comment:    (NOTE) Pre diabetes:          5.7%-6.4%  Diabetes:              >6.4%  Glycemic control for   <7.0% adults with diabetes   02/24/2020 08:30 AM 12.2 (H) 4.8 - 5.6 % Final    Comment:    (NOTE) Pre diabetes:          5.7%-6.4%  Diabetes:              >6.4%  Glycemic control for   <7.0% adults with diabetes     CBG: Recent Labs  Lab 03/13/20 0918 03/13/20 0950 03/13/20 1124 03/13/20 1216 03/13/20 1410  GLUCAP 308* 304* 248* 216* 185*   Chest x-ray (personal interpretation) 12/31: Bilateral bibasilar dense consolidations.  Resolved left pneumothorax following placement of new chest tube.  Assessment & Plan:   Critically ill due to acute hypoxic and hypercarbic respiratory failure requiring VV ECMO support and mechanical ventilation. ARDS due to COVID-19 pneumonia Pneumothorax Bilateral DVT ,cardiac clot. Likely small PE but hemodynamic stability likely will start large clot.  Plan: -Switch to dexmedetomidine allow fentanyl washout. -SBT and attempt awake extubated ECMO -Titrate ECMO blood and gas flows to  normal ABG -Chest tube to continuous suction -Complete courses of remdesivir, corticosteroids, Actemra. -Complete empiric  courses of antibiotics for community-acquired pneumonia -Will need 3 months of anticoagulation for thromboembolic disease.  Daily Goals Checklist  Pain/Anxiety/Delirium protocol (if indicated): Dexmedetomidine VAP protocol (if indicated): Bundle Respiratory support goals: Rest ventilator settings.  ECMO support.  SBT and possible extubation Blood pressure target: MAP greater than 65 on no vasoactive DVT prophylaxis: Systemic bivalirudin. Nutritional status and feeding goals: High nutritional risk, initiate tube feeds today via core track GI prophylaxis: Pantoprazole Fluid status goals: Volume seeking.  Allow autoregulation Urinary catheter: Assessment of intravascular volume Central lines: Right IJ crescent, left subclavian triple-lumen, right radial arterial line Glucose control: Suboptimal glycemic control, will change to resistant sliding scale and increase Lantus Mobility/therapy needs: Bedrest for now Antibiotic de-escalation: Complete 7 days of antibiotic Home medication reconciliation: None Daily labs: Per ECMO protocol Code Status: Full Family Communication: Son and mother updated 12/31.  Indicated to them that patient had stabilized on ECMO and the plan was to reduce sedation over the next 48 hours and attempt awake extubated support.  Did inform them that this is not always possible and that amount of support may actually increase as we are catching him fairly early in his disease course. Disposition: ICU.  Critical care time: 40 minutes.    Lynnell Catalan, MD [facility] ICU Physician [facility] [place] [facility]  Pager: [phone] Or Epic Secure Chat After hours: [phone].  03/13/2020, 4:09 PM

## 2020-03-13 DEATH — deceased

## 2020-03-14 ENCOUNTER — Inpatient Hospital Stay (HOSPITAL_COMMUNITY): Payer: Medicaid Other

## 2020-03-14 DIAGNOSIS — I5021 Acute systolic (congestive) heart failure: Secondary | ICD-10-CM

## 2020-03-14 LAB — POCT I-STAT 7, (LYTES, BLD GAS, ICA,H+H)
Acid-Base Excess: 3 mmol/L — ABNORMAL HIGH (ref 0.0–2.0)
Acid-Base Excess: 4 mmol/L — ABNORMAL HIGH (ref 0.0–2.0)
Acid-Base Excess: 2 mmol/L (ref 0.0–2.0)
Acid-Base Excess: 1 mmol/L (ref 0.0–2.0)
Acid-Base Excess: 2 mmol/L (ref 0.0–2.0)
Acid-Base Excess: 3 mmol/L — ABNORMAL HIGH (ref 0.0–2.0)
Acid-Base Excess: 7 mmol/L — ABNORMAL HIGH (ref 0.0–2.0)
Bicarbonate: 27.4 mmol/L (ref 20.0–28.0)
Bicarbonate: 28.1 mmol/L — ABNORMAL HIGH (ref 20.0–28.0)
Bicarbonate: 27.5 mmol/L (ref 20.0–28.0)
Bicarbonate: 29 mmol/L — ABNORMAL HIGH (ref 20.0–28.0)
Bicarbonate: 28 mmol/L (ref 20.0–28.0)
Bicarbonate: 28.6 mmol/L — ABNORMAL HIGH (ref 20.0–28.0)
Bicarbonate: 31.8 mmol/L — ABNORMAL HIGH (ref 20.0–28.0)
Calcium, Ion: 1.05 mmol/L — ABNORMAL LOW (ref 1.15–1.40)
Calcium, Ion: 1.05 mmol/L — ABNORMAL LOW (ref 1.15–1.40)
Calcium, Ion: 1.1 mmol/L — ABNORMAL LOW (ref 1.15–1.40)
Calcium, Ion: 1.11 mmol/L — ABNORMAL LOW (ref 1.15–1.40)
Calcium, Ion: 1.11 mmol/L — ABNORMAL LOW (ref 1.15–1.40)
Calcium, Ion: 1.07 mmol/L — ABNORMAL LOW (ref 1.15–1.40)
Calcium, Ion: 1.06 mmol/L — ABNORMAL LOW (ref 1.15–1.40)
HCT: 25 % — ABNORMAL LOW (ref 39.0–52.0)
HCT: 23 % — ABNORMAL LOW (ref 39.0–52.0)
HCT: 27 % — ABNORMAL LOW (ref 39.0–52.0)
HCT: 29 % — ABNORMAL LOW (ref 39.0–52.0)
HCT: 25 % — ABNORMAL LOW (ref 39.0–52.0)
HCT: 24 % — ABNORMAL LOW (ref 39.0–52.0)
HCT: 26 % — ABNORMAL LOW (ref 39.0–52.0)
Hemoglobin: 8.5 g/dL — ABNORMAL LOW (ref 13.0–17.0)
Hemoglobin: 7.8 g/dL — ABNORMAL LOW (ref 13.0–17.0)
Hemoglobin: 9.2 g/dL — ABNORMAL LOW (ref 13.0–17.0)
Hemoglobin: 9.9 g/dL — ABNORMAL LOW (ref 13.0–17.0)
Hemoglobin: 8.5 g/dL — ABNORMAL LOW (ref 13.0–17.0)
Hemoglobin: 8.2 g/dL — ABNORMAL LOW (ref 13.0–17.0)
Hemoglobin: 8.8 g/dL — ABNORMAL LOW (ref 13.0–17.0)
O2 Saturation: 97 %
O2 Saturation: 97 %
O2 Saturation: 88 %
O2 Saturation: 87 %
O2 Saturation: 90 %
O2 Saturation: 96 %
O2 Saturation: 99 %
Patient temperature: 36.8
Patient temperature: 36.9
Patient temperature: 36.8
Patient temperature: 36.8
Patient temperature: 36.7
Patient temperature: 36.5
Patient temperature: 36.6
Potassium: 3.8 mmol/L (ref 3.5–5.1)
Potassium: 3.7 mmol/L (ref 3.5–5.1)
Potassium: 4.3 mmol/L (ref 3.5–5.1)
Potassium: 4.3 mmol/L (ref 3.5–5.1)
Potassium: 4.1 mmol/L (ref 3.5–5.1)
Potassium: 4.1 mmol/L (ref 3.5–5.1)
Potassium: 4.1 mmol/L (ref 3.5–5.1)
Sodium: 141 mmol/L (ref 135–145)
Sodium: 144 mmol/L (ref 135–145)
Sodium: 145 mmol/L (ref 135–145)
Sodium: 144 mmol/L (ref 135–145)
Sodium: 144 mmol/L (ref 135–145)
Sodium: 146 mmol/L — ABNORMAL HIGH (ref 135–145)
Sodium: 145 mmol/L (ref 135–145)
TCO2: 29 mmol/L (ref 22–32)
TCO2: 29 mmol/L (ref 22–32)
TCO2: 29 mmol/L (ref 22–32)
TCO2: 31 mmol/L (ref 22–32)
TCO2: 30 mmol/L (ref 22–32)
TCO2: 30 mmol/L (ref 22–32)
TCO2: 33 mmol/L — ABNORMAL HIGH (ref 22–32)
pCO2 arterial: 39.1 mmHg (ref 32.0–48.0)
pCO2 arterial: 40.5 mmHg (ref 32.0–48.0)
pCO2 arterial: 44 mmHg (ref 32.0–48.0)
pCO2 arterial: 62.2 mmHg — ABNORMAL HIGH (ref 32.0–48.0)
pCO2 arterial: 51.2 mmHg — ABNORMAL HIGH (ref 32.0–48.0)
pCO2 arterial: 46.7 mmHg (ref 32.0–48.0)
pCO2 arterial: 44.8 mmHg (ref 32.0–48.0)
pH, Arterial: 7.453 — ABNORMAL HIGH (ref 7.350–7.450)
pH, Arterial: 7.449 (ref 7.350–7.450)
pH, Arterial: 7.402 (ref 7.350–7.450)
pH, Arterial: 7.275 — ABNORMAL LOW (ref 7.350–7.450)
pH, Arterial: 7.344 — ABNORMAL LOW (ref 7.350–7.450)
pH, Arterial: 7.393 (ref 7.350–7.450)
pH, Arterial: 7.457 — ABNORMAL HIGH (ref 7.350–7.450)
pO2, Arterial: 84 mmHg (ref 83.0–108.0)
pO2, Arterial: 86 mmHg (ref 83.0–108.0)
pO2, Arterial: 55 mmHg — ABNORMAL LOW (ref 83.0–108.0)
pO2, Arterial: 60 mmHg — ABNORMAL LOW (ref 83.0–108.0)
pO2, Arterial: 63 mmHg — ABNORMAL LOW (ref 83.0–108.0)
pO2, Arterial: 84 mmHg (ref 83.0–108.0)
pO2, Arterial: 110 mmHg — ABNORMAL HIGH (ref 83.0–108.0)

## 2020-03-14 LAB — BPAM RBC
Blood Product Expiration Date: 202201202359
Blood Product Expiration Date: 202201202359
Blood Product Expiration Date: 202201202359
Blood Product Expiration Date: 202201202359
Blood Product Expiration Date: 202201202359
Blood Product Expiration Date: 202201212359
Blood Product Expiration Date: 202202032359
Blood Product Expiration Date: 202202032359
Blood Product Expiration Date: 202202032359
Blood Product Expiration Date: 202202032359
ISSUE DATE / TIME: 202112301649
ISSUE DATE / TIME: 202112302312
ISSUE DATE / TIME: 202112310037
ISSUE DATE / TIME: 202112310518
ISSUE DATE / TIME: 202201011018
ISSUE DATE / TIME: 202201011445
ISSUE DATE / TIME: 202201012114
Unit Type and Rh: 5100
Unit Type and Rh: 5100
Unit Type and Rh: 5100
Unit Type and Rh: 5100
Unit Type and Rh: 7300
Unit Type and Rh: 7300
Unit Type and Rh: 7300
Unit Type and Rh: 7300
Unit Type and Rh: 7300
Unit Type and Rh: 7300

## 2020-03-14 LAB — GLUCOSE, CAPILLARY
Glucose-Capillary: 100 mg/dL — ABNORMAL HIGH (ref 70–99)
Glucose-Capillary: 102 mg/dL — ABNORMAL HIGH (ref 70–99)
Glucose-Capillary: 137 mg/dL — ABNORMAL HIGH (ref 70–99)
Glucose-Capillary: 143 mg/dL — ABNORMAL HIGH (ref 70–99)
Glucose-Capillary: 194 mg/dL — ABNORMAL HIGH (ref 70–99)
Glucose-Capillary: 204 mg/dL — ABNORMAL HIGH (ref 70–99)
Glucose-Capillary: 207 mg/dL — ABNORMAL HIGH (ref 70–99)
Glucose-Capillary: 209 mg/dL — ABNORMAL HIGH (ref 70–99)
Glucose-Capillary: 219 mg/dL — ABNORMAL HIGH (ref 70–99)
Glucose-Capillary: 220 mg/dL — ABNORMAL HIGH (ref 70–99)
Glucose-Capillary: 223 mg/dL — ABNORMAL HIGH (ref 70–99)
Glucose-Capillary: 236 mg/dL — ABNORMAL HIGH (ref 70–99)
Glucose-Capillary: 250 mg/dL — ABNORMAL HIGH (ref 70–99)
Glucose-Capillary: 254 mg/dL — ABNORMAL HIGH (ref 70–99)
Glucose-Capillary: 260 mg/dL — ABNORMAL HIGH (ref 70–99)
Glucose-Capillary: 87 mg/dL (ref 70–99)
Glucose-Capillary: 98 mg/dL (ref 70–99)

## 2020-03-14 LAB — CBC WITH DIFFERENTIAL/PLATELET
Abs Immature Granulocytes: 0.65 10*3/uL — ABNORMAL HIGH (ref 0.00–0.07)
Basophils Absolute: 0 10*3/uL (ref 0.0–0.1)
Basophils Relative: 0 %
Eosinophils Absolute: 0 10*3/uL (ref 0.0–0.5)
Eosinophils Relative: 0 %
HCT: 25.5 % — ABNORMAL LOW (ref 39.0–52.0)
Hemoglobin: 9 g/dL — ABNORMAL LOW (ref 13.0–17.0)
Immature Granulocytes: 7 %
Lymphocytes Relative: 8 %
Lymphs Abs: 0.7 10*3/uL (ref 0.7–4.0)
MCH: 28.8 pg (ref 26.0–34.0)
MCHC: 35.3 g/dL (ref 30.0–36.0)
MCV: 81.5 fL (ref 80.0–100.0)
Monocytes Absolute: 0.6 10*3/uL (ref 0.1–1.0)
Monocytes Relative: 6 %
Neutro Abs: 7.4 10*3/uL (ref 1.7–7.7)
Neutrophils Relative %: 79 %
Platelets: 139 10*3/uL — ABNORMAL LOW (ref 150–400)
RBC: 3.13 MIL/uL — ABNORMAL LOW (ref 4.22–5.81)
RDW: 17 % — ABNORMAL HIGH (ref 11.5–15.5)
WBC: 9.4 10*3/uL (ref 4.0–10.5)
nRBC: 3.5 % — ABNORMAL HIGH (ref 0.0–0.2)

## 2020-03-14 LAB — TYPE AND SCREEN
ABO/RH(D): B POS
Antibody Screen: NEGATIVE
Unit division: 0
Unit division: 0
Unit division: 0
Unit division: 0
Unit division: 0
Unit division: 0
Unit division: 0
Unit division: 0
Unit division: 0
Unit division: 0

## 2020-03-14 LAB — HEPATIC FUNCTION PANEL
ALT: 40 U/L (ref 0–44)
AST: 58 U/L — ABNORMAL HIGH (ref 15–41)
Albumin: 2.7 g/dL — ABNORMAL LOW (ref 3.5–5.0)
Alkaline Phosphatase: 73 U/L (ref 38–126)
Bilirubin, Direct: 0.2 mg/dL (ref 0.0–0.2)
Indirect Bilirubin: 0.9 mg/dL (ref 0.3–0.9)
Total Bilirubin: 1.1 mg/dL (ref 0.3–1.2)
Total Protein: 4 g/dL — ABNORMAL LOW (ref 6.5–8.1)

## 2020-03-14 LAB — BASIC METABOLIC PANEL
Anion gap: 9 (ref 5–15)
Anion gap: 7 (ref 5–15)
BUN: 43 mg/dL — ABNORMAL HIGH (ref 6–20)
BUN: 44 mg/dL — ABNORMAL HIGH (ref 6–20)
CO2: 26 mmol/L (ref 22–32)
CO2: 27 mmol/L (ref 22–32)
Calcium: 7 mg/dL — ABNORMAL LOW (ref 8.9–10.3)
Calcium: 7 mg/dL — ABNORMAL LOW (ref 8.9–10.3)
Chloride: 108 mmol/L (ref 98–111)
Chloride: 111 mmol/L (ref 98–111)
Creatinine, Ser: 0.88 mg/dL (ref 0.61–1.24)
Creatinine, Ser: 0.87 mg/dL (ref 0.61–1.24)
GFR, Estimated: >60 mL/min (ref >60)
GFR, Estimated: >60 mL/min (ref >60)
Glucose, Bld: 280 mg/dL — ABNORMAL HIGH (ref 70–99)
Glucose, Bld: 154 mg/dL — ABNORMAL HIGH (ref 70–99)
Potassium: 3.9 mmol/L (ref 3.5–5.1)
Potassium: 4.2 mmol/L (ref 3.5–5.1)
Sodium: 143 mmol/L (ref 135–145)
Sodium: 145 mmol/L (ref 135–145)

## 2020-03-14 LAB — CBC
HCT: 25.7 % — ABNORMAL LOW (ref 39.0–52.0)
Hemoglobin: 8.5 g/dL — ABNORMAL LOW (ref 13.0–17.0)
MCH: 27.7 pg (ref 26.0–34.0)
MCHC: 33.1 g/dL (ref 30.0–36.0)
MCV: 83.7 fL (ref 80.0–100.0)
Platelets: 115 10*3/uL — ABNORMAL LOW (ref 150–400)
RBC: 3.07 MIL/uL — ABNORMAL LOW (ref 4.22–5.81)
RDW: 17.2 % — ABNORMAL HIGH (ref 11.5–15.5)
WBC: 8.5 10*3/uL (ref 4.0–10.5)
nRBC: 2.6 % — ABNORMAL HIGH (ref 0.0–0.2)

## 2020-03-14 LAB — APTT
aPTT: 45 seconds — ABNORMAL HIGH (ref 24–36)
aPTT: 49 seconds — ABNORMAL HIGH (ref 24–36)
aPTT: 53 seconds — ABNORMAL HIGH (ref 24–36)
aPTT: 54 seconds — ABNORMAL HIGH (ref 24–36)
aPTT: 52 seconds — ABNORMAL HIGH (ref 24–36)

## 2020-03-14 LAB — FIBRINOGEN: Fibrinogen: 123 mg/dL — ABNORMAL LOW (ref 210–475)

## 2020-03-14 LAB — TRIGLYCERIDES: Triglycerides: 512 mg/dL — ABNORMAL HIGH (ref <150)

## 2020-03-14 LAB — LACTIC ACID, PLASMA
Lactic Acid, Venous: 1.9 mmol/L (ref 0.5–1.9)
Lactic Acid, Venous: 1.7 mmol/L (ref 0.5–1.9)

## 2020-03-14 LAB — PROTIME-INR
INR: 1.5 — ABNORMAL HIGH (ref 0.8–1.2)
Prothrombin Time: 17.9 seconds — ABNORMAL HIGH (ref 11.4–15.2)

## 2020-03-14 LAB — LACTATE DEHYDROGENASE: LDH: 728 U/L — ABNORMAL HIGH (ref 98–192)

## 2020-03-14 MED ORDER — POTASSIUM CHLORIDE 20 MEQ PO PACK
20.0000 meq | PACK | Freq: Once | ORAL | Status: AC
  Administered 2020-03-14: 20 meq via ORAL
  Filled 2020-03-14: qty 1

## 2020-03-14 MED ORDER — ORAL CARE MOUTH RINSE
15.0000 mL | Freq: Two times a day (BID) | OROMUCOSAL | Status: DC
  Administered 2020-03-14 – 2020-03-16 (×6): 15 mL via OROMUCOSAL

## 2020-03-14 MED ORDER — OXYCODONE HCL 5 MG PO TABS
10.0000 mg | ORAL_TABLET | ORAL | Status: DC | PRN
  Administered 2020-03-14 – 2020-03-15 (×5): 10 mg via ORAL
  Filled 2020-03-14 (×5): qty 2

## 2020-03-14 MED ORDER — FUROSEMIDE 10 MG/ML IJ SOLN
20.0000 mg | Freq: Two times a day (BID) | INTRAMUSCULAR | Status: DC
  Administered 2020-03-14 – 2020-03-15 (×3): 20 mg via INTRAVENOUS
  Filled 2020-03-14 (×3): qty 2

## 2020-03-14 MED ORDER — CLONAZEPAM 0.5 MG PO TABS
1.0000 mg | ORAL_TABLET | Freq: Two times a day (BID) | ORAL | Status: DC
  Administered 2020-03-14 – 2020-03-16 (×6): 1 mg
  Filled 2020-03-14 (×6): qty 2

## 2020-03-14 MED ORDER — CHLORHEXIDINE GLUCONATE 0.12 % MT SOLN
15.0000 mL | Freq: Two times a day (BID) | OROMUCOSAL | Status: DC
  Administered 2020-03-14 – 2020-03-17 (×6): 15 mL via OROMUCOSAL
  Filled 2020-03-14 (×2): qty 15

## 2020-03-14 NOTE — Procedures (Signed)
Extubation Procedure Note  Patient Details:   Name: Alex [name] DOB: 23-Apr-1970 MRN: [id number]   Airway Documentation:   Patient extubated per orders. Placed on HHFNC 40L/100%. Patient had positive cuff leak prior to extubation. Sats 93%. Will continue to monitor. RN at bedside instructing on incentive spirometer.  Vent end date: 03/14/20 Vent end time: 0830   Evaluation  O2 sats: stable throughout Complications: No apparent complications Patient did tolerate procedure well. Bilateral Breath Sounds: Clear,Diminished   Yes  Suszanne Conners 03/14/2020, 8:31 AM

## 2020-03-14 NOTE — Progress Notes (Signed)
ANTICOAGULATION CONSULT NOTE  Pharmacy Consult for Bivalirudin Indication: ECMO + DVTs  No Known Allergies  Patient Measurements: Height: 5\' 4"  (162.6 cm) Weight: 77.5 kg (170 lb 13.7 oz) IBW/kg (Calculated) : 59.2 Heparin Dosing Weight: 72kg  Vital Signs: Temp: 98.42 F (36.9 C) (01/02 0715) BP: 125/77 (01/02 0700) Pulse Rate: 79 (01/02 0715)  Labs: Recent Labs    03/12/20 0332 03/12/20 0340 03/13/20 0314 03/13/20 0324 03/13/20 1626 03/13/20 1628 03/13/20 2025 03/14/20 0429 03/14/20 0435 03/14/20 0557  HGB 10.5*   < > 8.9*   < > 8.3*   < > 7.6* 9.0* 8.5* 7.8*  HCT 32.1*   < > 24.1*   < > 24.9*   < > 21.6* 25.5* 25.0* 23.0*  PLT 143*   < > 167  --  158  --  145* 139*  --   --   APTT  --    < > 44*   < > 46*  --  46* 45*  --   --   LABPROT 18.0*  --  17.3*  --   --   --   --  17.9*  --   --   INR 1.5*  --  1.5*  --   --   --   --  1.5*  --   --   CREATININE 1.56*   < > 0.96  --  0.87  --   --  0.88  --   --    < > = values in this interval not displayed.    Estimated Creatinine Clearance: 97.6 mL/min (by C-G formula based on SCr of 0.88 mg/dL).   Medical History: History reviewed. No pertinent past medical history.   Assessment: 27 yoM admitted with COVID-19 PNA with worsening hypoxia now s/p cannulation for ECMO. Pt was started on IV heparin prior to cannulation due to acute DVTs and possible PE, thrombus in RV, now to transition to bivalirudin.   APTT 45 on bivalirudin 0.04 mg/kg/hr.  Circuit with increasing fibrin per RN report.  Goal of Therapy:  APTT 50-80 secs per MD (targeting closer to 80) Monitor platelets by anticoagulation protocol: Yes   Plan:  Increase bivalirudin to 0.06 mg/kg/hr Recheck aPTT in 2 hrs. Daily aPTT, CBC.  57, Reece Leader, BCCP Clinical Pharmacist  03/14/2020 7:33 AM   [facility] pharmacy phone numbers are listed on [email or web address]

## 2020-03-14 NOTE — Progress Notes (Signed)
ANTICOAGULATION CONSULT NOTE  Pharmacy Consult for Bivalirudin Indication: ECMO + DVTs  No Known Allergies  Patient Measurements: Height: 5\' 4"  (162.6 cm) Weight: 77.5 kg (170 lb 13.7 oz) IBW/kg (Calculated) : 59.2 Heparin Dosing Weight: 72kg  Vital Signs: Temp: 97.88 F (36.6 C) (01/02 1715) Temp Source: Core (01/02 1600) BP: 115/73 (01/02 1700) Pulse Rate: 80 (01/02 1715)  Labs: Recent Labs    03/12/20 0332 03/12/20 0340 03/13/20 0314 03/13/20 0324 03/13/20 1626 03/13/20 1628 03/13/20 2025 03/14/20 0429 03/14/20 0435 03/14/20 1009 03/14/20 1010 03/14/20 1153 03/14/20 1343 03/14/20 1634  HGB 10.5*   < > 8.9*   < > 8.3*   < > 7.6* 9.0*   < >  --  9.9* 8.5*  --  8.5*  HCT 32.1*   < > 24.1*   < > 24.9*   < > 21.6* 25.5*   < >  --  29.0* 25.0*  --  25.7*  PLT 143*   < > 167  --  158  --  145* 139*  --   --   --   --   --  115*  APTT  --    < > 44*   < > 46*  --  46* 45*  --  49*  --   --  53* 54*  LABPROT 18.0*  --  17.3*  --   --   --   --  17.9*  --   --   --   --   --   --   INR 1.5*  --  1.5*  --   --   --   --  1.5*  --   --   --   --   --   --   CREATININE 1.56*   < > 0.96  --  0.87  --   --  0.88  --   --   --   --   --  0.87   < > = values in this interval not displayed.    Estimated Creatinine Clearance: 98.7 mL/min (by C-G formula based on SCr of 0.87 mg/dL).   Medical History: History reviewed. No pertinent past medical history.   Assessment: 69 yoM admitted with COVID-19 PNA with worsening hypoxia now s/p cannulation for ECMO. Pt was started on IV heparin prior to cannulation due to acute DVTs and possible PE, thrombus in RV, now to transition to bivalirudin.   APTT 54 on bivalirudin 0.09 mg/kg/hr.    Goal of Therapy:  APTT 50-80 secs per MD (targeting closer to 80) Monitor platelets by anticoagulation protocol: Yes   Plan:  Increase bivalirudin to 0.1 mg/kg/hr Recheck aPTT in 4 hrs. Daily aPTT, CBC.  57, PharmD Clinical  Pharmacist **Pharmacist phone directory can now be found on [email or web address] ([email or web address]).  Listed under [facility].

## 2020-03-14 NOTE — Progress Notes (Signed)
  Echocardiogram 2D Echocardiogram has been performed.  Delcie Roch 03/14/2020, 5:25 PM

## 2020-03-14 NOTE — Progress Notes (Signed)
   Palliative Medicine Inpatient Follow Up Note  HPI:50 y.o. male  with no significant past medical history admitted on 03/07/2020 with dyspnea, cough, nausea/vomiting ~ 1 week ago with worsening symptoms of body aches and fatigue and +COVID 02/07/20 and admitted with shortness of breath. Required intubation 03/10/20. Cannulated for VV ECMO 03/12/2020. Oxygenating better with ECMO. Also evidence of RLE DVT, RV thrombus, and high suspicion of PE. Has required chest tube to L lung for collapse which needs further reposition with recurrent collapse.   Today's Discussion (03/14/2020): Chart reviewed.   Attended ECMO rounds this morning.   Patient remains to be able to follow commands.   Unable to extubate yesterday so hopefully will do so today if patient remains stable. Left pneumothorax with improvement s/p chest tube to suction. Remains on CTX, axithro, and prednisone. (+) volume overload will get some diuresis today.   Patient son, Sharia Reeve updated over the phone. He is hopeful to get an update when his father is extubated. We discussed anticoagulation and the reality that Jemar in the setting of thromboembolic disease will require this for a three month period of time.   Discussed the importance of continued conversation with family and their  medical providers regarding overall plan of care and treatment options, ensuring decisions are within the context of the patients values and GOCs.  Questions and concerns addressed   Objective Assessment: Vital Signs Vitals:   03/14/20 0731 03/14/20 0830  BP: 125/77   Pulse: 78   Resp: (!) 28   Temp: 98.42 F (36.9 C)   SpO2: 97% 92%    Intake/Output Summary (Last 24 hours) at 03/14/2020 0841 Last data filed at 03/14/2020 0800 Gross per 24 hour  Intake 4915.46 ml  Output 3070 ml  Net 1845.46 ml   Last Weight  Most recent update: 03/14/2020  6:06 AM   Weight  77.5 kg (170 lb 13.7 oz)           Physical Exam Vitals and nursing note reviewed.   Constitutional:      General: He is not in acute distress. Cardiovascular:     Rate and Rhythm: Normal rate.  Pulmonary:     Effort: No tachypnea, accessory muscle usage or respiratory distress.     Comments: Sedated on vent; L chest tube with serous drainage VV ECMO Abdominal:     General: Abdomen is flat.  Neurological:     Comments: Sedated   SUMMARY OF RECOMMENDATIONS   Full Code / Full Scope of treatment  ECMO as a bridge to recovery  Ongoing Palliative care support  Spiritual Support - Christian  Time Spent: 25 Greater than 50% of the time was spent in counseling and coordination of care ______________________________________________________________________________________ Lamarr Lulas [facility] Palliative Medicine Team Team Cell Phone: [phone] Please utilize secure chat with additional questions, if there is no response within 30 minutes please call the above phone number  Palliative Medicine Team providers are available by phone from 7am to 7pm daily and can be reached through the team cell phone.  Should this patient require assistance outside of these hours, please call the patient's attending physician.

## 2020-03-14 NOTE — Progress Notes (Signed)
ANTICOAGULATION CONSULT NOTE  Pharmacy Consult for Bivalirudin Indication: ECMO + DVTs  No Known Allergies  Patient Measurements: Height: 5\' 4"  (162.6 cm) Weight: 77.5 kg (170 lb 13.7 oz) IBW/kg (Calculated) : 59.2 Heparin Dosing Weight: 72kg  Vital Signs: Temp: 98.24 F (36.8 C) (01/02 2200) Temp Source: Core (01/02 1600) BP: 132/75 (01/02 2200) Pulse Rate: 79 (01/02 2200)  Labs: Recent Labs    03/12/20 0332 03/12/20 0340 03/13/20 0314 03/13/20 0324 03/13/20 1626 03/13/20 1628 03/13/20 2025 03/14/20 0429 03/14/20 0435 03/14/20 1343 03/14/20 1633 03/14/20 1634 03/14/20 1949 03/14/20 2153  HGB 10.5*   < > 8.9*   < > 8.3*   < > 7.6* 9.0*   < >  --  8.2* 8.5* 8.8*  --   HCT 32.1*   < > 24.1*   < > 24.9*   < > 21.6* 25.5*   < >  --  24.0* 25.7* 26.0*  --   PLT 143*   < > 167  --  158  --  145* 139*  --   --   --  115*  --   --   APTT  --    < > 44*   < > 46*  --  46* 45*   < > 53*  --  54*  --  52*  LABPROT 18.0*  --  17.3*  --   --   --   --  17.9*  --   --   --   --   --   --   INR 1.5*  --  1.5*  --   --   --   --  1.5*  --   --   --   --   --   --   CREATININE 1.56*   < > 0.96  --  0.87  --   --  0.88  --   --   --  0.87  --   --    < > = values in this interval not displayed.    Estimated Creatinine Clearance: 98.7 mL/min (by C-G formula based on SCr of 0.87 mg/dL).   Medical History: History reviewed. No pertinent past medical history.   Assessment: 42 yoM admitted with COVID-19 PNA with worsening hypoxia now s/p cannulation for ECMO. Pt was started on IV heparin prior to cannulation due to acute DVTs and possible PE, thrombus in RV, now to transition to bivalirudin.   APTT 54 on bivalirudin 0.09 mg/kg/hr.    Goal of Therapy:  APTT 50-80 secs per MD (targeting closer to 80) Monitor platelets by anticoagulation protocol: Yes   Plan:  Increase bivalirudin to 0.1 mg/kg/hr Recheck aPTT in 4 hrs. Daily aPTT, CBC.  57, PharmD Clinical  Pharmacist **Pharmacist phone directory can now be found on [email or web address] ([email or web address]).  Listed under [facility].   addendum -aPTT= 52  Plan -Increase bivalirudin to 0.12 mg/kg/hr -check aPTT in am  [facility], PharmD Clinical Pharmacist **Pharmacist phone directory can now be found on [email or web address] ([email or web address]).  Listed under [facility].

## 2020-03-14 NOTE — Plan of Care (Signed)
  Problem: Coping: Goal: Psychosocial and spiritual needs will be supported Outcome: Progressing   Problem: Respiratory: Goal: Ability to maintain a clear airway and adequate ventilation will improve Outcome: Progressing Note: Able to extubate today but placed on BiPAP

## 2020-03-14 NOTE — Progress Notes (Signed)
ECMO PROGRESS NOTE  NAME:  Alex [name], MRN:  [id number], DOB:  Aug 25, 1970, LOS: 5 ADMISSION DATE:  02/17/2020, CONSULTATION DATE: 03/08/2020 REFERRING MD: Ander Slade -LBPCCM, CHIEF COMPLAINT: Respiratory failure requiring ECMO  HPI/course in hospital  50 year old man admitted to hospital 12/28 with 1 week history of dyspnea cough nausea and vomiting.  Initially admitted to [facility] long hospital and placed on high flow nasal cannula but rapidly failed and required intubation 12/29.  Persistent hypoxic respiratory failure with PF ratio 55 in spite of 18 of PEEP FiO2 0.1 despite paralytics.  Did not improve with prone ventilation  Cannulated for VV ECMO 12/30 via right IJ crescent cannula. Iatrogenic pneumothorax from left subclavian triple-lumen placement  Family confirms no significant past medical history apart from possible prediabetes  Past Medical History  History reviewed. No pertinent past medical history.   Past Surgical History:  Procedure Laterality Date  . [place] teeth      Interim history/subjective:  Received albumin again due to chatter following attempt at diuresis. Extubated today but complains of pain.  Objective   Blood pressure (!) 109/52, pulse 79, temperature 98.2 F (36.8 C), resp. rate (!) 24, height 5\' 4"  (1.626 m), weight 77.5 kg, SpO2 (!) 89 %. CVP:  [6 mmHg-11 mmHg] 10 mmHg  Vent Mode: PCV FiO2 (%):  [40 %-100 %] 100 % Set Rate:  [20 bmp] 20 bmp PEEP:  [5 cmH20-10 cmH20] 10 cmH20 Pressure Support:  [10 cmH20] 10 cmH20 Plateau Pressure:  [18 cmH20] 18 cmH20   Intake/Output Summary (Last 24 hours) at 03/14/2020 1139 Last data filed at 03/14/2020 1000 Gross per 24 hour  Intake 3643.14 ml  Output 3205 ml  Net 438.14 ml   Filed Weights   03/12/20 0500 03/13/20 0045 03/14/20 0600  Weight: 75.9 kg 74.6 kg 77.5 kg    ECMO Device: Cardiohelp  ECMO Mode: VV  Flow (LPM): 3.71   Examination:  Physical Exam Constitutional:      Appearance: Normal  appearance. He is normal weight.     Interventions: He is awake and agitated at times.  HENT:     Head: Normocephalic and atraumatic.     Comments: Right IJ crescent cannula sutured in place. Eyes:     Conjunctiva/sclera: Conjunctivae normal.  Neck:     Trachea: Trachea normal.     Comments: extubated.  Cardiovascular:     Rate and Rhythm: Normal rate and regular rhythm.     Pulses: Normal pulses.     Heart sounds: Normal heart sounds.  Pulmonary:     Effort: He is intubated.  Apnea successful this morning.     Breath sounds: Rales present. No decreased breath sounds, wheezing or rhonchi.     Comments: At the bases bilaterally Chest:     Comments: Left chest tube in place.  Nonfluctuant firmness overlying left infraclavicular area has improved Abdominal:     General: Abdomen is flat. Bowel sounds are normal.     Palpations: Abdomen is soft.     Tenderness: There is no abdominal tenderness.  Genitourinary:    Comments: Foley catheter in place Musculoskeletal:     Right lower leg: No edema.     Left lower leg: No edema.  Neurological:     Mental Status: He is awake and following commands     Ancillary tests (personally reviewed)  CBC: Recent Labs  Lab 03/10/20 0222 02/28/2020 0518 02/12/2020 1804 03/12/20 0865 03/12/20 0340 03/12/20 1605 03/12/20 1613 03/13/20 7846 03/13/20 9629  03/13/20 1626 03/13/20 1628 03/13/20 2025 03/14/20 0429 03/14/20 0435 03/14/20 0557 03/14/20 0918 03/14/20 1010  WBC 6.4 5.0   < > 7.1  --  9.0  --  7.6  --  9.4  --  8.3 9.4  --   --   --   --   NEUTROABS 5.9 4.4  --  6.1  --   --   --  6.6  --   --   --   --  7.4  --   --   --   --   HGB 14.1 14.8   < > 10.5*   < > 9.4*   < > 8.9*   < > 8.3*   < > 7.6* 9.0* 8.5* 7.8* 9.2* 9.9*  HCT 40.2 44.2   < > 32.1*   < > 28.2*   < > 24.1*   < > 24.9*   < > 21.6* 25.5* 25.0* 23.0* 27.0* 29.0*  MCV 83.2 86.2   < > 83.2  --  82.7  --  82.0  --  82.7  --  80.6 81.5  --   --   --   --   PLT 302 206   < >  143*  --  156  --  167  --  158  --  145* 139*  --   --   --   --    < > = values in this interval not displayed.    Basic Metabolic Panel: Recent Labs  Lab 03/10/20 1849 02/20/2020 0518 03/06/2020 1804 02/12/2020 2112 02/25/2020 2116 03/12/20 0332 03/12/20 0340 03/12/20 1605 03/12/20 1613 03/13/20 0314 03/13/20 0324 03/13/20 1626 03/13/20 1628 03/14/20 0429 03/14/20 0435 03/14/20 0557 03/14/20 0918 03/14/20 1010  NA  --   --    < > 132*   < > 134*   < > 137   < > 137   < > 143   < > 143 141 144 145 144  K  --   --    < > 4.2   < > 4.1   < > 4.4   < > 4.4   < > 4.3   < > 3.9 3.8 3.7 4.3 4.3  CL  --   --    < > 101  --  104  --  107  --  106  --  110  --  108  --   --   --   --   CO2  --   --    < > 20*  --  22  --  23  --  23  --  25  --  26  --   --   --   --   GLUCOSE  --   --    < > 290*  --  319*  --  382*  --  362*  --  183*  --  280*  --   --   --   --   BUN  --   --    < > 51*  --  52*  --  49*  --  47*  --  46*  --  43*  --   --   --   --   CREATININE  --   --    < > 1.81*  --  1.56*  --  1.27*  --  0.96  --  0.87  --  0.88  --   --   --   --  CALCIUM  --   --    < > 6.1*  --  6.6*  --  6.7*  --  6.9*  --  7.0*  --  7.0*  --   --   --   --   MG 2.8* 3.3*  --  2.9*  --   --   --  2.8*  --  3.4*  --   --   --   --   --   --   --   --   PHOS 4.2 4.3  --  3.5  --   --   --  3.3  --  1.8*  --   --   --   --   --   --   --   --    < > = values in this interval not displayed.   GFR: Estimated Creatinine Clearance: 97.6 mL/min (by C-G formula based on SCr of 0.88 mg/dL). Recent Labs  Lab 03/03/2020 0830 03/10/20 0222 03/12/20 1616 03/13/20 0314 03/13/20 1626 03/13/20 1632 03/13/20 2025 03/14/20 0429  PROCALCITON 18.38  --   --   --   --   --   --   --   WBC 6.3   < >  --  7.6 9.4  --  8.3 9.4  LATICACIDVEN 1.4   < > 1.7 2.1*  --  2.0*  --  1.9   < > = values in this interval not displayed.    Liver Function Tests: Recent Labs  Lab 03/10/20 0222 Mar 13, 2020 2112  03/12/20 0332 03/13/20 0314 03/14/20 0429  AST 117* 63* 53* 55* 58*  ALT 76* 48* 38 39 40  ALKPHOS 92 96 68 76 73  BILITOT 0.7 0.5 0.5 0.3 1.1  PROT 6.4* 4.1* 3.9* 3.8* 4.0*  ALBUMIN 3.0* 2.2* 2.4* 2.4* 2.7*   No results for input(s): LIPASE, AMYLASE in the last 168 hours. No results for input(s): AMMONIA in the last 168 hours.  ABG    Component Value Date/Time   PHART 7.275 (L) 03/14/2020 1010   PCO2ART 62.2 (H) 03/14/2020 1010   PO2ART 60 (L) 03/14/2020 1010   HCO3 29.0 (H) 03/14/2020 1010   TCO2 31 03/14/2020 1010   ACIDBASEDEF 2.0 03/12/2020 2026   O2SAT 87.0 03/14/2020 1010     Coagulation Profile: Recent Labs  Lab Mar 13, 2020 2112 03/12/20 0332 03/13/20 0314 03/14/20 0429  INR 2.0* 1.5* 1.5* 1.5*    Cardiac Enzymes: No results for input(s): CKTOTAL, CKMB, CKMBINDEX, TROPONINI in the last 168 hours.  HbA1C: Hgb A1c MFr Bld  Date/Time Value Ref Range Status  02/11/2020 06:10 PM 11.8 (H) 4.8 - 5.6 % Final    Comment:    (NOTE) Pre diabetes:          5.7%-6.4%  Diabetes:              >6.4%  Glycemic control for   <7.0% adults with diabetes   02/22/2020 08:30 AM 12.2 (H) 4.8 - 5.6 % Final    Comment:    (NOTE) Pre diabetes:          5.7%-6.4%  Diabetes:              >6.4%  Glycemic control for   <7.0% adults with diabetes     CBG: Recent Labs  Lab 03/14/20 0250 03/14/20 0427 03/14/20 0555 03/14/20 0752 03/14/20 0916  GLUCAP 254* 260* 250* 223* 194*   Chest x-ray (personal interpretation) 12/31: Bilateral bibasilar dense consolidations.  Resolved left pneumothorax following placement of new chest tube.  Assessment & Plan:   Critically ill due to acute hypoxic and hypercarbic respiratory failure requiring VV ECMO support and mechanical ventilation. ARDS due to COVID-19 pneumonia Pneumothorax Bilateral DVT ,cardiac clot. Likely small PE but hemodynamic stability likely will start large clot.  Plan: -Extubated to HFNC, BIPAP as needed.  -  limit diuresis and allow spontaneous fluid clearance.  -Titrate ECMO blood and gas flows to normal ABG -Chest tube to continuous suction -Complete courses of remdesivir, corticosteroids, Actemra. -Complete empiric courses of antibiotics for community-acquired pneumonia -Will need 3 months of anticoagulation for thromboembolic disease.  Daily Goals Checklist  Pain/Anxiety/Delirium protocol (if indicated): enteral oxycodone and clonazepam VAP protocol (if indicated): extubated/  Respiratory support goals: Rest ventilator settings.  ECMO support.   Extubated to HHFNC, BIPAP prn. Progressive ambulation, encourage self proning.  Blood pressure target: MAP greater than 65 on no vasoactive DVT prophylaxis: Systemic bivalirudin. Nutritional status and feeding goals: High nutritional risk,  tube feeds via core track GI prophylaxis: Pantoprazole Fluid status goals: Volume seeking.  Allow autoregulation Urinary catheter: Assessment of intravascular volume Central lines: Right IJ crescent, left subclavian triple-lumen, right radial arterial line Glucose control: Suboptimal glycemic control, will change to resistant sliding scale and increase Lantus Mobility/therapy needs: Bedrest for now Antibiotic de-escalation: Complete 7 days of antibiotic Home medication reconciliation: None Daily labs: Per ECMO protocol Code Status: Full Family Communication: Son and mother updated 12/31.  Indicated to them that patient had stabilized on ECMO and the plan was to reduce sedation over the next 48 hours and attempt awake extubated support.  Did inform them that this is not always possible and that amount of support may actually increase as we are catching him fairly early in his disease course. Disposition: ICU.  Critical care time: 40 minutes.    Lynnell Catalan, MD [facility] ICU Physician [facility] Critical Care  Pager: [phone] Or Epic Secure Chat After hours: [phone].  03/14/2020, 11:39  AM

## 2020-03-14 NOTE — Progress Notes (Addendum)
ANTICOAGULATION CONSULT NOTE  Pharmacy Consult for Bivalirudin Indication: ECMO + DVTs  No Known Allergies  Patient Measurements: Height: 5\' 4"  (162.6 cm) Weight: 77.5 kg (170 lb 13.7 oz) IBW/kg (Calculated) : 59.2 Heparin Dosing Weight: 72kg  Vital Signs: Temp: 97.88 F (36.6 C) (01/02 1415) Temp Source: Core (01/02 1200) BP: 108/64 (01/02 1400) Pulse Rate: 84 (01/02 1415)  Labs: Recent Labs    03/12/20 0332 03/12/20 0340 03/13/20 0314 03/13/20 0324 03/13/20 1626 03/13/20 1628 03/13/20 2025 03/14/20 0429 03/14/20 0435 03/14/20 0918 03/14/20 1009 03/14/20 1010 03/14/20 1153 03/14/20 1343  HGB 10.5*   < > 8.9*   < > 8.3*   < > 7.6* 9.0*   < > 9.2*  --  9.9* 8.5*  --   HCT 32.1*   < > 24.1*   < > 24.9*   < > 21.6* 25.5*   < > 27.0*  --  29.0* 25.0*  --   PLT 143*   < > 167  --  158  --  145* 139*  --   --   --   --   --   --   APTT  --    < > 44*   < > 46*  --  46* 45*  --   --  49*  --   --  53*  LABPROT 18.0*  --  17.3*  --   --   --   --  17.9*  --   --   --   --   --   --   INR 1.5*  --  1.5*  --   --   --   --  1.5*  --   --   --   --   --   --   CREATININE 1.56*   < > 0.96  --  0.87  --   --  0.88  --   --   --   --   --   --    < > = values in this interval not displayed.    Estimated Creatinine Clearance: 97.6 mL/min (by C-G formula based on SCr of 0.88 mg/dL).   Medical History: History reviewed. No pertinent past medical history.   Assessment: 50 yoM admitted with COVID-19 PNA with worsening hypoxia now s/p cannulation for ECMO. Pt was started on IV heparin prior to cannulation due to acute DVTs and possible PE, thrombus in RV, now to transition to bivalirudin.   APTT 49 on bivalirudin 0.06 mg/kg/hr.  Circuit with increasing fibrin per RN report.  Goal of Therapy:  APTT 50-80 secs per MD (targeting closer to 80) Monitor platelets by anticoagulation protocol: Yes   Plan:  Increase bivalirudin to 0.08 mg/kg/hr Recheck aPTT in 2 hrs. Daily aPTT,  CBC.  57, Reece Leader, BCCP Clinical Pharmacist  03/14/2020 2:25 PM   [facility] pharmacy phone numbers are listed on [email or web address]  Addendum:  F/u aPTT up to 53 on bivalirudin at 0.08 mg/kg/hr.  Will increase to 0.09 mg/kg/hr.  Recheck aPTT with 5 pm labs.  [facility], Reece Leader, BCCP Clinical Pharmacist  03/14/2020 2:26 PM   [facility] pharmacy phone numbers are listed on [email or web address]

## 2020-03-14 NOTE — Progress Notes (Signed)
Assisted tele visit to patient with family member.  Alex [name] Harold, RN  

## 2020-03-14 NOTE — Procedures (Signed)
Extracorporeal support note   ECLS support day: 4 Indication: Severe respiratory failure secondary to COVID-19 pneumonia with RV dysfunction  Configuration: Venovenous  Drainage cannula: 32 French crescent cannula via right IJ Return cannula: Same  Pump speed: 3.23 RPM Pump flow: 3.71 L/min Pump used: Cardio help  Oxygenator: Cardio help O2 blender: 100% Sweep gas: 2   Circuit check: clot at corners. Anticoagulant: Bivalirudin Anticoagulation targets: PTT 50-80  Changes in support: Continue current level of support.  Extubated today.  Anticipated goals/duration of support: Bridge to recovery.  Lynnell Catalan, MD [facility] ICU Physician [facility] Critical Care  Pager: [phone] Mobile: [phone] After hours: [phone].  03/14/2020, 11:35 AM

## 2020-03-14 NOTE — Progress Notes (Signed)
Patient ID: Alex [name], male   DOB: Apr 23, 1970, 50 y.o.   MRN: [id number]     Advanced Heart Failure Rounding Note  PCP-Cardiologist: No primary care provider on file.   Subjective:    - 12/30: VV ECMO cannulation - 12/31: Left chest tube replaced  CXR with bibasilar infiltrates, re-expanded left lung.    Bivalirudin gtt PTT 45. More fibrin noted in circuit.   I/Os positive with weight up.   Patient remains on ceftriaxone/azithromycin.   Patient is awake/alert on vent this morning. Off Propofol, only getting Fentanyl.   ECMO parameters: 2650 rpm Flow 2.95 L/min Pvenous -33 Delta P 13 Sweep 4 ABG 7.45/40.5/86/97% LDH 410 => 543 => 728 Lactate 1.9  Objective:   Weight Range: 77.5 kg Body mass index is 29.33 kg/m.   Vital Signs:   Temp:  [98.06 F (36.7 C)-98.6 F (37 C)] 98.42 F (36.9 C) (01/02 0731) Pulse Rate:  [71-85] 78 (01/02 0731) Resp:  [0-30] 28 (01/02 0731) BP: (96-158)/(54-95) 125/77 (01/02 0731) SpO2:  [83 %-100 %] 97 % (01/02 0731) Arterial Line BP: (113-235)/(50-130) 156/69 (01/02 0715) FiO2 (%):  [40 %-100 %] 60 % (01/02 0731) Weight:  [77.5 kg] 77.5 kg (01/02 0600) Last BM Date: 03/13/20  Weight change: Filed Weights   03/12/20 0500 03/13/20 0045 03/14/20 0600  Weight: 75.9 kg 74.6 kg 77.5 kg    Intake/Output:   Intake/Output Summary (Last 24 hours) at 03/14/2020 0742 Last data filed at 03/14/2020 0600 Gross per 24 hour  Intake 5035.54 ml  Output 3065 ml  Net 1970.54 ml      Physical Exam    General: Intubated/awake Neck: No JVD, no thyromegaly or thyroid nodule.  Lungs: Decreased at bases.  CV: Nondisplaced PMI.  Heart regular S1/S2, no S3/S4, no murmur.  No peripheral edema.   Abdomen: Soft, nontender, no hepatosplenomegaly, no distention.  Skin: Intact without lesions or rashes.  Neurologic: Follow commands.  Extremities: No clubbing or cyanosis.  HEENT: Normal.    Telemetry   NSR 90s (personally reviewed)  Labs     CBC Recent Labs    03/13/20 0314 03/13/20 0324 03/13/20 2025 03/14/20 0429 03/14/20 0435 03/14/20 0557  WBC 7.6   < > 8.3 9.4  --   --   NEUTROABS 6.6  --   --  7.4  --   --   HGB 8.9*   < > 7.6* 9.0* 8.5* 7.8*  HCT 24.1*   < > 21.6* 25.5* 25.0* 23.0*  MCV 82.0   < > 80.6 81.5  --   --   PLT 167   < > 145* 139*  --   --    < > = values in this interval not displayed.   Basic Metabolic Panel Recent Labs    03/12/20 1605 03/12/20 1613 03/13/20 0314 03/13/20 0324 03/13/20 1626 03/13/20 1628 03/14/20 0429 03/14/20 0435 03/14/20 0557  NA 137   < > 137   < > 143   < > 143 141 144  K 4.4   < > 4.4   < > 4.3   < > 3.9 3.8 3.7  CL 107  --  106  --  110  --  108  --   --   CO2 23  --  23  --  25  --  26  --   --   GLUCOSE 382*  --  362*  --  183*  --  280*  --   --  BUN 49*  --  47*  --  46*  --  43*  --   --   CREATININE 1.27*  --  0.96  --  0.87  --  0.88  --   --   CALCIUM 6.7*  --  6.9*  --  7.0*  --  7.0*  --   --   MG 2.8*  --  3.4*  --   --   --   --   --   --   PHOS 3.3  --  1.8*  --   --   --   --   --   --    < > = values in this interval not displayed.   Liver Function Tests Recent Labs    03/13/20 0314 03/14/20 0429  AST 55* 58*  ALT 39 40  ALKPHOS 76 73  BILITOT 0.3 1.1  PROT 3.8* 4.0*  ALBUMIN 2.4* 2.7*   No results for input(s): LIPASE, AMYLASE in the last 72 hours. Cardiac Enzymes No results for input(s): CKTOTAL, CKMB, CKMBINDEX, TROPONINI in the last 72 hours.  BNP: BNP (last 3 results) No results for input(s): BNP in the last 8760 hours.  ProBNP (last 3 results) No results for input(s): PROBNP in the last 8760 hours.   D-Dimer Recent Labs    03/12/20 0332 03/13/20 0314  DDIMER >20.00* 9.61*   Hemoglobin A1C No results for input(s): HGBA1C in the last 72 hours. Fasting Lipid Panel Recent Labs    03/14/20 0429  TRIG 512*   Thyroid Function Tests No results for input(s): TSH, T4TOTAL, T3FREE, THYROIDAB in the last 72  hours.  Invalid input(s): FREET3  Other results:   Imaging    No results found.   Medications:     Scheduled Medications: . chlorhexidine gluconate (MEDLINE KIT)  15 mL Mouth Rinse BID  . Chlorhexidine Gluconate Cloth  6 each Topical Daily  . free water  200 mL Per Tube Q6H  . living well with diabetes book   Does not apply Once  . mouth rinse  15 mL Mouth Rinse 10 times per day  . pantoprazole (PROTONIX) IV  40 mg Intravenous QHS  . predniSONE  50 mg Oral Daily  . propranolol  40 mg Per Tube TID  . sodium chloride flush  10-40 mL Intracatheter Q12H    Infusions: . sodium chloride    . sodium chloride 10 mL/hr at 03/14/20 0600  . sodium chloride Stopped (03/12/20 0310)  . sodium chloride Stopped (03/12/20 0131)  . albumin human 60 mL/hr at 03/14/20 0600  . bivalirudin (ANGIOMAX) infusion 0.5 mg/mL (Non-ACS indications) 0.05 mg/kg/hr (03/14/20 0720)  . dexmedetomidine (PRECEDEX) IV infusion 1.1 mcg/kg/hr (03/14/20 6213)  . feeding supplement (PIVOT 1.5 CAL) 1,000 mL (03/14/20 0625)  . fentaNYL infusion INTRAVENOUS 25 mcg/hr (03/14/20 0600)  . insulin 9.5 mL/hr at 03/14/20 0600  . lactated ringers Stopped (03/10/2020 1522)  . norepinephrine (LEVOPHED) Adult infusion Stopped (03/12/20 1156)  . propofol (DIPRIVAN) infusion Stopped (03/13/20 0721)  . vecuronium (NORCURON) infusion 115m/100mL (1 mg/mL) Stopped (03/12/20 1055)    PRN Medications: Place/Maintain arterial line **AND** sodium chloride, sodium chloride, sodium chloride, acetaminophen, albumin human, chlorpheniramine-HYDROcodone, dextrose, fentaNYL, hydrALAZINE, labetalol, ondansetron (ZOFRAN) IV, sodium chloride flush, vecuronium   Assessment/Plan   1. Acute hypoxemic respiratory failure: Due to COVID-19 PNA with bilateral infiltrates.  Refractory hypoxemia, VV-ECMO cannulation on 02/18/2020 with improvement in oxygenation.  Developed left PTX post-subclavian CVL and has left chest tube, the left lung  is  re-expanded.  CXR with bibasilar infiltrates. LDH higher this morning with more fibrin in circuit.  - ECMO circuit functioning appropriately, good ABG this morning.   - Increase bivalirudin goal to PTT 60-80.  - Patient has had remdesivir, tocilizumab. - Ongoing steroids with prednisone.  - Completed antibiotics.   - Aiming for extubation today, start PSV trial.    - Gentle diuresis, Lasix 20 mg IV bid.  2. RLE DVT/thrombus in RV/suspect PE: Echo with moderately dilated and moderately dysfunctional RV.  Clot noted on TEE in RV as well.  - Increase bivalirudin for goal PTT 60-80, has been subtherapeutic.  - Echo to reassess RV.  3. Left PTX: Left chest tube, lung is re-expanded.   4. Shock: Suspect septic/distributive.  Now resolved, off NE.  5. Anemia: Transfused 1 unit overnight.  Hgb 9, transfuse < 8.   6. AKI: Resolving, creatinine down to 0.96.  Net positive.  7. Hyperglycemia: Insulin gtt today.   CRITICAL CARE Performed by: Loralie Champagne  Total critical care time: 40 minutes  Critical care time was exclusive of separately billable procedures and treating other patients.  Critical care was necessary to treat or prevent imminent or life-threatening deterioration.  Critical care was time spent personally by me on the following activities: development of treatment plan with patient and/or surrogate as well as nursing, discussions with consultants, evaluation of patient's response to treatment, examination of patient, obtaining history from patient or surrogate, ordering and performing treatments and interventions, ordering and review of laboratory studies, ordering and review of radiographic studies, pulse oximetry and re-evaluation of patient's condition.    Length of Stay: 5  Loralie Champagne, MD  03/14/2020, 7:42 AM  Advanced Heart Failure Team Pager [phone] (M-F; 7a - 4p)  Please contact [facility] for night-coverage after hours (4p -7a ) and weekends on [email or web address]

## 2020-03-15 ENCOUNTER — Inpatient Hospital Stay (HOSPITAL_COMMUNITY): Payer: Medicaid Other

## 2020-03-15 ENCOUNTER — Encounter (HOSPITAL_COMMUNITY): Payer: Self-pay | Admitting: Cardiology

## 2020-03-15 DIAGNOSIS — Z9281 Personal history of extracorporeal membrane oxygenation (ECMO): Secondary | ICD-10-CM

## 2020-03-15 DIAGNOSIS — J9602 Acute respiratory failure with hypercapnia: Secondary | ICD-10-CM

## 2020-03-15 LAB — POCT I-STAT 7, (LYTES, BLD GAS, ICA,H+H)
Acid-Base Excess: 6 mmol/L — ABNORMAL HIGH (ref 0.0–2.0)
Acid-Base Excess: 5 mmol/L — ABNORMAL HIGH (ref 0.0–2.0)
Acid-Base Excess: 8 mmol/L — ABNORMAL HIGH (ref 0.0–2.0)
Acid-Base Excess: 5 mmol/L — ABNORMAL HIGH (ref 0.0–2.0)
Acid-Base Excess: 4 mmol/L — ABNORMAL HIGH (ref 0.0–2.0)
Acid-Base Excess: 5 mmol/L — ABNORMAL HIGH (ref 0.0–2.0)
Acid-Base Excess: 5 mmol/L — ABNORMAL HIGH (ref 0.0–2.0)
Bicarbonate: 31.1 mmol/L — ABNORMAL HIGH (ref 20.0–28.0)
Bicarbonate: 31.3 mmol/L — ABNORMAL HIGH (ref 20.0–28.0)
Bicarbonate: 32.8 mmol/L — ABNORMAL HIGH (ref 20.0–28.0)
Bicarbonate: 32.4 mmol/L — ABNORMAL HIGH (ref 20.0–28.0)
Bicarbonate: 33.4 mmol/L — ABNORMAL HIGH (ref 20.0–28.0)
Bicarbonate: 30.4 mmol/L — ABNORMAL HIGH (ref 20.0–28.0)
Bicarbonate: 30.2 mmol/L — ABNORMAL HIGH (ref 20.0–28.0)
Calcium, Ion: 1.09 mmol/L — ABNORMAL LOW (ref 1.15–1.40)
Calcium, Ion: 1.13 mmol/L — ABNORMAL LOW (ref 1.15–1.40)
Calcium, Ion: 1.07 mmol/L — ABNORMAL LOW (ref 1.15–1.40)
Calcium, Ion: 1.07 mmol/L — ABNORMAL LOW (ref 1.15–1.40)
Calcium, Ion: 1.16 mmol/L (ref 1.15–1.40)
Calcium, Ion: 1.05 mmol/L — ABNORMAL LOW (ref 1.15–1.40)
Calcium, Ion: 1.03 mmol/L — ABNORMAL LOW (ref 1.15–1.40)
HCT: 25 % — ABNORMAL LOW (ref 39.0–52.0)
HCT: 27 % — ABNORMAL LOW (ref 39.0–52.0)
HCT: 22 % — ABNORMAL LOW (ref 39.0–52.0)
HCT: 27 % — ABNORMAL LOW (ref 39.0–52.0)
HCT: 27 % — ABNORMAL LOW (ref 39.0–52.0)
HCT: 25 % — ABNORMAL LOW (ref 39.0–52.0)
HCT: 31 % — ABNORMAL LOW (ref 39.0–52.0)
Hemoglobin: 8.5 g/dL — ABNORMAL LOW (ref 13.0–17.0)
Hemoglobin: 9.2 g/dL — ABNORMAL LOW (ref 13.0–17.0)
Hemoglobin: 7.5 g/dL — ABNORMAL LOW (ref 13.0–17.0)
Hemoglobin: 9.2 g/dL — ABNORMAL LOW (ref 13.0–17.0)
Hemoglobin: 9.2 g/dL — ABNORMAL LOW (ref 13.0–17.0)
Hemoglobin: 8.5 g/dL — ABNORMAL LOW (ref 13.0–17.0)
Hemoglobin: 10.5 g/dL — ABNORMAL LOW (ref 13.0–17.0)
O2 Saturation: 98 %
O2 Saturation: 90 %
O2 Saturation: 99 %
O2 Saturation: 89 %
O2 Saturation: 81 %
O2 Saturation: 89 %
O2 Saturation: 94 %
Patient temperature: 36.8
Patient temperature: 36.9
Patient temperature: 36.8
Patient temperature: 36.9
Patient temperature: 37.1
Patient temperature: 36.8
Patient temperature: 36.9
Potassium: 4 mmol/L (ref 3.5–5.1)
Potassium: 4.8 mmol/L (ref 3.5–5.1)
Potassium: 4.1 mmol/L (ref 3.5–5.1)
Potassium: 4.4 mmol/L (ref 3.5–5.1)
Potassium: 4.6 mmol/L (ref 3.5–5.1)
Potassium: 4.4 mmol/L (ref 3.5–5.1)
Potassium: 4.3 mmol/L (ref 3.5–5.1)
Sodium: 144 mmol/L (ref 135–145)
Sodium: 144 mmol/L (ref 135–145)
Sodium: 145 mmol/L (ref 135–145)
Sodium: 144 mmol/L (ref 135–145)
Sodium: 145 mmol/L (ref 135–145)
Sodium: 146 mmol/L — ABNORMAL HIGH (ref 135–145)
Sodium: 145 mmol/L (ref 135–145)
TCO2: 32 mmol/L (ref 22–32)
TCO2: 33 mmol/L — ABNORMAL HIGH (ref 22–32)
TCO2: 34 mmol/L — ABNORMAL HIGH (ref 22–32)
TCO2: 34 mmol/L — ABNORMAL HIGH (ref 22–32)
TCO2: 36 mmol/L — ABNORMAL HIGH (ref 22–32)
TCO2: 32 mmol/L (ref 22–32)
TCO2: 32 mmol/L (ref 22–32)
pCO2 arterial: 46.1 mmHg (ref 32.0–48.0)
pCO2 arterial: 56.5 mmHg — ABNORMAL HIGH (ref 32.0–48.0)
pCO2 arterial: 46.1 mmHg (ref 32.0–48.0)
pCO2 arterial: 61.1 mmHg — ABNORMAL HIGH (ref 32.0–48.0)
pCO2 arterial: 82.9 mmHg (ref 32.0–48.0)
pCO2 arterial: 48.6 mmHg — ABNORMAL HIGH (ref 32.0–48.0)
pCO2 arterial: 44.9 mmHg (ref 32.0–48.0)
pH, Arterial: 7.436 (ref 7.350–7.450)
pH, Arterial: 7.351 (ref 7.350–7.450)
pH, Arterial: 7.459 — ABNORMAL HIGH (ref 7.350–7.450)
pH, Arterial: 7.332 — ABNORMAL LOW (ref 7.350–7.450)
pH, Arterial: 7.214 — ABNORMAL LOW (ref 7.350–7.450)
pH, Arterial: 7.404 (ref 7.350–7.450)
pH, Arterial: 7.435 (ref 7.350–7.450)
pO2, Arterial: 98 mmHg (ref 83.0–108.0)
pO2, Arterial: 64 mmHg — ABNORMAL LOW (ref 83.0–108.0)
pO2, Arterial: 131 mmHg — ABNORMAL HIGH (ref 83.0–108.0)
pO2, Arterial: 62 mmHg — ABNORMAL LOW (ref 83.0–108.0)
pO2, Arterial: 58 mmHg — ABNORMAL LOW (ref 83.0–108.0)
pO2, Arterial: 56 mmHg — ABNORMAL LOW (ref 83.0–108.0)
pO2, Arterial: 71 mmHg — ABNORMAL LOW (ref 83.0–108.0)

## 2020-03-15 LAB — HEPATIC FUNCTION PANEL
ALT: 38 U/L (ref 0–44)
AST: 55 U/L — ABNORMAL HIGH (ref 15–41)
Albumin: 3.1 g/dL — ABNORMAL LOW (ref 3.5–5.0)
Alkaline Phosphatase: 76 U/L (ref 38–126)
Bilirubin, Direct: 0.2 mg/dL (ref 0.0–0.2)
Indirect Bilirubin: 0.9 mg/dL (ref 0.3–0.9)
Total Bilirubin: 1.1 mg/dL (ref 0.3–1.2)
Total Protein: 4.5 g/dL — ABNORMAL LOW (ref 6.5–8.1)

## 2020-03-15 LAB — BASIC METABOLIC PANEL
Anion gap: 10 (ref 5–15)
Anion gap: 8 (ref 5–15)
BUN: 42 mg/dL — ABNORMAL HIGH (ref 6–20)
BUN: 45 mg/dL — ABNORMAL HIGH (ref 6–20)
CO2: 28 mmol/L (ref 22–32)
CO2: 30 mmol/L (ref 22–32)
Calcium: 7.4 mg/dL — ABNORMAL LOW (ref 8.9–10.3)
Calcium: 7.1 mg/dL — ABNORMAL LOW (ref 8.9–10.3)
Chloride: 107 mmol/L (ref 98–111)
Chloride: 106 mmol/L (ref 98–111)
Creatinine, Ser: 0.86 mg/dL (ref 0.61–1.24)
Creatinine, Ser: 0.94 mg/dL (ref 0.61–1.24)
GFR, Estimated: >60 mL/min (ref >60)
GFR, Estimated: >60 mL/min (ref >60)
Glucose, Bld: 218 mg/dL — ABNORMAL HIGH (ref 70–99)
Glucose, Bld: 150 mg/dL — ABNORMAL HIGH (ref 70–99)
Potassium: 4.2 mmol/L (ref 3.5–5.1)
Potassium: 4.3 mmol/L (ref 3.5–5.1)
Sodium: 145 mmol/L (ref 135–145)
Sodium: 144 mmol/L (ref 135–145)

## 2020-03-15 LAB — CBC
HCT: 27.3 % — ABNORMAL LOW (ref 39.0–52.0)
HCT: 25.2 % — ABNORMAL LOW (ref 39.0–52.0)
Hemoglobin: 9 g/dL — ABNORMAL LOW (ref 13.0–17.0)
Hemoglobin: 8.1 g/dL — ABNORMAL LOW (ref 13.0–17.0)
MCH: 28 pg (ref 26.0–34.0)
MCH: 28.2 pg (ref 26.0–34.0)
MCHC: 33 g/dL (ref 30.0–36.0)
MCHC: 32.1 g/dL (ref 30.0–36.0)
MCV: 84.8 fL (ref 80.0–100.0)
MCV: 87.8 fL (ref 80.0–100.0)
Platelets: 121 10*3/uL — ABNORMAL LOW (ref 150–400)
Platelets: 119 10*3/uL — ABNORMAL LOW (ref 150–400)
RBC: 3.22 MIL/uL — ABNORMAL LOW (ref 4.22–5.81)
RBC: 2.87 MIL/uL — ABNORMAL LOW (ref 4.22–5.81)
RDW: 17.2 % — ABNORMAL HIGH (ref 11.5–15.5)
RDW: 17.7 % — ABNORMAL HIGH (ref 11.5–15.5)
WBC: 13.6 10*3/uL — ABNORMAL HIGH (ref 4.0–10.5)
WBC: 13.8 10*3/uL — ABNORMAL HIGH (ref 4.0–10.5)
nRBC: 1.6 % — ABNORMAL HIGH (ref 0.0–0.2)
nRBC: 0.5 % — ABNORMAL HIGH (ref 0.0–0.2)

## 2020-03-15 LAB — GLUCOSE, CAPILLARY
Glucose-Capillary: 177 mg/dL — ABNORMAL HIGH (ref 70–99)
Glucose-Capillary: 182 mg/dL — ABNORMAL HIGH (ref 70–99)
Glucose-Capillary: 194 mg/dL — ABNORMAL HIGH (ref 70–99)
Glucose-Capillary: 200 mg/dL — ABNORMAL HIGH (ref 70–99)
Glucose-Capillary: 210 mg/dL — ABNORMAL HIGH (ref 70–99)
Glucose-Capillary: 188 mg/dL — ABNORMAL HIGH (ref 70–99)
Glucose-Capillary: 209 mg/dL — ABNORMAL HIGH (ref 70–99)
Glucose-Capillary: 181 mg/dL — ABNORMAL HIGH (ref 70–99)
Glucose-Capillary: 151 mg/dL — ABNORMAL HIGH (ref 70–99)
Glucose-Capillary: 137 mg/dL — ABNORMAL HIGH (ref 70–99)
Glucose-Capillary: 143 mg/dL — ABNORMAL HIGH (ref 70–99)
Glucose-Capillary: 185 mg/dL — ABNORMAL HIGH (ref 70–99)
Glucose-Capillary: 196 mg/dL — ABNORMAL HIGH (ref 70–99)
Glucose-Capillary: 142 mg/dL — ABNORMAL HIGH (ref 70–99)
Glucose-Capillary: 182 mg/dL — ABNORMAL HIGH (ref 70–99)
Glucose-Capillary: 133 mg/dL — ABNORMAL HIGH (ref 70–99)
Glucose-Capillary: 141 mg/dL — ABNORMAL HIGH (ref 70–99)
Glucose-Capillary: 148 mg/dL — ABNORMAL HIGH (ref 70–99)
Glucose-Capillary: 144 mg/dL — ABNORMAL HIGH (ref 70–99)

## 2020-03-15 LAB — APTT
aPTT: 51 seconds — ABNORMAL HIGH (ref 24–36)
aPTT: 55 seconds — ABNORMAL HIGH (ref 24–36)
aPTT: 59 seconds — ABNORMAL HIGH (ref 24–36)
aPTT: 59 seconds — ABNORMAL HIGH (ref 24–36)

## 2020-03-15 LAB — LACTATE DEHYDROGENASE: LDH: 1041 U/L — ABNORMAL HIGH (ref 98–192)

## 2020-03-15 LAB — PROTIME-INR
INR: 1.6 — ABNORMAL HIGH (ref 0.8–1.2)
Prothrombin Time: 18.8 seconds — ABNORMAL HIGH (ref 11.4–15.2)

## 2020-03-15 LAB — TRIGLYCERIDES: Triglycerides: 449 mg/dL — ABNORMAL HIGH (ref <150)

## 2020-03-15 LAB — LACTIC ACID, PLASMA
Lactic Acid, Venous: 1.9 mmol/L (ref 0.5–1.9)
Lactic Acid, Venous: 1.5 mmol/L (ref 0.5–1.9)

## 2020-03-15 LAB — FIBRINOGEN: Fibrinogen: 160 mg/dL — ABNORMAL LOW (ref 210–475)

## 2020-03-15 MED ORDER — PIVOT 1.5 CAL PO LIQD
1000.0000 mL | ORAL | Status: DC
  Administered 2020-03-15 – 2020-04-02 (×17): 1000 mL
  Filled 2020-03-15: qty 1000

## 2020-03-15 MED ORDER — VANCOMYCIN HCL 2000 MG/400ML IV SOLN
2000.0000 mg | Freq: Once | INTRAVENOUS | Status: AC
  Administered 2020-03-15: 2000 mg via INTRAVENOUS
  Filled 2020-03-15: qty 400

## 2020-03-15 MED ORDER — FENTANYL CITRATE (PF) 100 MCG/2ML IJ SOLN
50.0000 ug | INTRAMUSCULAR | Status: DC | PRN
  Administered 2020-03-16 – 2020-03-17 (×8): 50 ug via INTRAVENOUS
  Filled 2020-03-15 (×9): qty 2

## 2020-03-15 MED ORDER — PREDNISONE 20 MG PO TABS
50.0000 mg | ORAL_TABLET | Freq: Every day | ORAL | Status: DC
  Administered 2020-03-16 – 2020-03-19 (×4): 50 mg
  Filled 2020-03-15 (×5): qty 2

## 2020-03-15 MED ORDER — LORAZEPAM 2 MG/ML IJ SOLN
0.5000 mg | INTRAMUSCULAR | Status: DC | PRN
  Administered 2020-03-15 – 2020-03-23 (×19): 0.5 mg via INTRAVENOUS
  Filled 2020-03-15 (×20): qty 1

## 2020-03-15 MED ORDER — SODIUM CHLORIDE 0.9 % IV SOLN: 2.0000 g | Freq: Three times a day (TID) | INTRAVENOUS | Status: DC

## 2020-03-15 MED ORDER — VANCOMYCIN HCL IN DEXTROSE 1-5 GM/200ML-% IV SOLN
1000.0000 mg | Freq: Two times a day (BID) | INTRAVENOUS | Status: DC
  Administered 2020-03-16 – 2020-03-18 (×5): 1000 mg via INTRAVENOUS
  Filled 2020-03-15 (×5): qty 200

## 2020-03-15 MED ORDER — FREE WATER
200.0000 mL | Status: DC
  Administered 2020-03-15 – 2020-03-19 (×15): 200 mL

## 2020-03-15 MED ORDER — HYDROCOD POLST-CPM POLST ER 10-8 MG/5ML PO SUER
5.0000 mL | Freq: Two times a day (BID) | ORAL | Status: DC | PRN
  Administered 2020-03-16 – 2020-04-05 (×16): 5 mL
  Filled 2020-03-15 (×18): qty 5

## 2020-03-15 MED ORDER — QUETIAPINE FUMARATE 50 MG PO TABS
50.0000 mg | ORAL_TABLET | Freq: Two times a day (BID) | ORAL | Status: DC
  Administered 2020-03-15: 50 mg via ORAL
  Filled 2020-03-15: qty 1

## 2020-03-15 MED ORDER — OXYCODONE HCL 5 MG PO TABS
10.0000 mg | ORAL_TABLET | ORAL | Status: DC | PRN
  Administered 2020-03-15: 10 mg via ORAL
  Filled 2020-03-15: qty 2

## 2020-03-15 MED ORDER — OXYCODONE HCL 5 MG PO TABS
10.0000 mg | ORAL_TABLET | ORAL | Status: DC | PRN
  Administered 2020-03-15 – 2020-04-06 (×15): 10 mg
  Filled 2020-03-15 (×16): qty 2

## 2020-03-15 MED ORDER — QUETIAPINE FUMARATE 50 MG PO TABS
50.0000 mg | ORAL_TABLET | Freq: Two times a day (BID) | ORAL | Status: DC
  Administered 2020-03-15: 50 mg
  Filled 2020-03-15: qty 1

## 2020-03-15 MED ORDER — ACETAMINOPHEN 160 MG/5ML PO SOLN
650.0000 mg | Freq: Four times a day (QID) | ORAL | Status: DC | PRN
  Administered 2020-03-16 – 2020-05-01 (×7): 650 mg
  Filled 2020-03-15 (×9): qty 20.3

## 2020-03-15 MED ORDER — SODIUM CHLORIDE 0.9 % IV SOLN
2.0000 g | Freq: Three times a day (TID) | INTRAVENOUS | Status: AC
  Administered 2020-03-15 – 2020-03-24 (×28): 2 g via INTRAVENOUS
  Filled 2020-03-15 (×28): qty 2

## 2020-03-15 NOTE — Progress Notes (Signed)
Pharmacy Antibiotic Note  Alex [name] is a 50 y.o. male admitted on 03/02/2020 with sepsis.  Pharmacy has been consulted for vancomycin and cefepime dosing.  WBC increasing to 13.6 (on prednisone). LA at 1.9. Afebrile. Scr stable at 0.86. CXR concerning for RLL pneumonia - recently completed ceftriaxone/azithromycin on 1/1.   Plan: Cefepime 2g IV every 8 hours  Vancomycin 2g IV once then 1g IV every 12 hours Monitor renal fx, cx results, clinical pic  Height: 5\' 4"  (162.6 cm) Weight: 77.5 kg (170 lb 13.7 oz) IBW/kg (Calculated) : 59.2  Temp (24hrs), Avg:98.2 F (36.8 C), Min:97.88 F (36.6 C), Max:98.6 F (37 C)  Recent Labs  Lab 03/13/20 0314 03/13/20 1626 03/13/20 1632 03/13/20 2025 03/14/20 0429 03/14/20 1634 03/15/20 0346  WBC 7.6 9.4  --  8.3 9.4 8.5 13.6*  CREATININE 0.96 0.87  --   --  0.88 0.87 0.86  LATICACIDVEN 2.1*  --  2.0*  --  1.9 1.7 1.9    Estimated Creatinine Clearance: 99.9 mL/min (by C-G formula based on SCr of 0.86 mg/dL).    No Known Allergies  Antimicrobials this admission: Ceftriaxone 12/28 >> 1/1 Azithromycin 12/28 >> 1/1 Vancomycin 1/3 >> Cefepime 1/3 >>  Dose adjustments this admission: N/A  Microbiology results: 1/3 BCx: sent 1/3 UCx: sent   1/3 Sputum: sent  12/28 MRSA PCR: neg   Thank you for allowing pharmacy to be a part of this patient's care.  1/29, PharmD, BCCCP Clinical Pharmacist  Phone: [phone] 03/15/2020 12:20 PM  Please check AMION for all [facility] phone numbers After 10:00 PM, call Main Pharmacy [phone]

## 2020-03-15 NOTE — Progress Notes (Signed)
Upon stimulation, patient became hypertensive with SBP 190s by aline. Cuff pressure 40-4mmHg consistently lower than aline. When Bipap taken off to assess patient's mentation and for mouth care, SBP rose into the low to mid 200s. Attempted to reassure and reorient patient to his current situation. Patient became very anxious trying to "get up." Bipap put back on and patient immediately calmed down. BP still elevated so treated with prn Labetalol.

## 2020-03-15 NOTE — Progress Notes (Signed)
Nutrition Follow-up  DOCUMENTATION CODES:   Not applicable  INTERVENTION:   Tube Feeding via Cortrak:  Increase Pivot 1.5 to 65 ml/hr Provides 2340 kcals, 146 g of protein and 1186 mL of free water Meets 100% estimated calorie and protein needs  Total free water with TF plus free water flush of 200 mL q 4 hours: 2386 mL  NUTRITION DIAGNOSIS:   Increased nutrient needs related to acute illness,catabolic illness (COVID-19 infection) as evidenced by estimated needs.  Being addressed via TF   GOAL:   Patient will meet greater than or equal to 90% of their needs  Progressing  MONITOR:   Vent status,TF tolerance,Labs,Weight trends  REASON FOR ASSESSMENT:   Ventilator,Consult Enteral/tube feeding initiation and management  ASSESSMENT:   50 y.o. male with no significant medical history. He presented to the ED with dyspnea, cough, and N/V. He reported that his symptoms began with cough 1 week ago. He tried OTC meds but nothing helped. Symptoms worsened to include body aches, fatigue, and N/V 3 days later. He went to his PCP 12/26 and got tested for COVID; he was positive.  12/26 COVID+  12/28 Admitted to [facility] 12/29 Intubated 12/30 Transferred to [facility], VV ECMO cannulation, L PTX with Chest tube insertion 12/31 Cortrak placed, Post-pyloric  1/02 Extubated to HFNC/BiPap as needed, ECHO with EF 60-65%  Pt currently on BiPap, unable to tolerate HFNC Remains on VV ECMO  Pivot 1.5 at 60 ml/hr via Cortrak (post pyloric), Free water flush of 300 mL q 6 hours. Some nausea, no vomiting. Noted order for zofran prn  Sodium has been trending between 144-146, recommend increasing free water flush  CBGs well controlled currently on insulin drip  Labs: reviewed Meds: lasix, prednisone, insulin drip  Diet Order:   Diet Order            Diet NPO time specified  Diet effective now                 EDUCATION NEEDS:   Not appropriate for education at this time  Skin:  Skin  Assessment: Reviewed RN Assessment  Last BM:  12/31 rectal tube  Height:   Ht Readings from Last 1 Encounters:  02/23/2020 5\' 4"  (1.626 m)    Weight:   Wt Readings from Last 1 Encounters:  03/15/20 77.5 kg    Ideal Body Weight:  59.1 kg  BMI:  Body mass index is 29.33 kg/m.  Estimated Nutritional Needs:   Kcal:  2160-2520 kcals  Protein:  130-150 g  Fluid:  >/= 2 L   05-30-1992 MS, RDN, LDN, CNSC Registered Dietitian III Clinical Nutrition RD Pager and On-Call Pager Number Located in [place]

## 2020-03-15 NOTE — Evaluation (Signed)
Physical Therapy Evaluation Patient Details Name: Alex [name] MRN: [id number] DOB: 02-23-1971 Today's Date: 03/15/2020   History of Present Illness  50 y.o. male  with no significant past medical history admitted on 02/29/2020 with dyspnea, cough, nausea/vomiting ~ 1 week ago with worsening symptoms of body aches and fatigue and +COVID 02/07/20 and admitted with shortness of breath. Required intubation 03/10/20. Cannulated for VV ECMO Apr 01, 2020. Oxygenating better with ECMO. Also evidence of RLE DVT, RV thrombus, and high suspicion of PE. Has required chest tube to L lung for collapse which needs further reposition with recurrent collapse. Extubated 03-15-19  Clinical Impression  Pt admitted with above diagnosis. Pt was not very reponsive to PT today. PT performed P/ROM all 4 extremities.  Did elevate bed to 55 degree sitting position for 10 minutes and pts RR decr to 30's and Saturations incr to 85% for a brief time. Left pt in 45 degree chair position. Will follow acutely and progress pt as able.  Pt currently with functional limitations due to the deficits listed below (see PT Problem List). Pt will benefit from skilled PT to increase their independence and safety with mobility to allow discharge to the venue listed below.      Follow Up Recommendations CIR;Supervision/Assistance - 24 hour    Equipment Recommendations  Other (comment) (TBA)    Recommendations for Other Services       Precautions / Restrictions Precautions Precautions: Fall Precaution Comments: ECMO, Heated HFNC Restrictions Weight Bearing Restrictions: No      Mobility  Bed Mobility Overal bed mobility: Needs Assistance Bed Mobility: Rolling;Sidelying to Sit           General bed mobility comments: Did not assess today as pt lethargic but nurse states he needs max to total assist to roll.Progressively inclined pts bed to 55 degree chair position for 10 min.  Left pt at 45 degree chair position.      Transfers                 General transfer comment: TBA  Ambulation/Gait             General Gait Details: TBA  Stairs            Wheelchair Mobility    Modified Rankin (Stroke Patients Only)       Balance                                             Pertinent Vitals/Pain Pain Assessment:  (per nurse chest pain)    Home Living Family/patient expects to be discharged to:: Private residence Living Arrangements: Alone;Other relatives (son in college and brother who can take off work) Available Help at Discharge: Available 24 hours/day;Family Type of Home: House Home Access: Stairs to enter Entrance Stairs-Rails: Doctor, general practice of Steps: 4 Home Layout: One level Home Equipment: None Additional Comments: Pt coaches football - [place]    Prior Function Level of Independence: Independent               Hand Dominance        Extremity/Trunk Assessment   Upper Extremity Assessment Upper Extremity Assessment: RUE deficits/detail;LUE deficits/detail RUE Deficits / Details: unable to assess as pt did not assist with movement LUE Deficits / Details: unable to assess as pt did not assist with movement    Lower Extremity Assessment Lower Extremity Assessment:  RLE deficits/detail;LLE deficits/detail RLE Deficits / Details: unable to assess as pt did not assist with movement LLE Deficits / Details: unable to assess as pt did not assist with movement       Communication   Communication: Other (comment) (No verbalizations)  Cognition Arousal/Alertness: Lethargic;Suspect due to medications Behavior During Therapy: Flat affect Overall Cognitive Status: Difficult to assess                                        General Comments General comments (skin integrity, edema, etc.): 89-91 bpm, Sats 79-85% during evaluation - nurse aware that saturations are low.  Pt on heated HFNC-50L/Min.  RR  31-43. BP 121/63.    Exercises General Exercises - Upper Extremity Shoulder Flexion: PROM;Both;5 reps;Supine Elbow Flexion: PROM;Both;5 reps;Supine Elbow Extension: PROM;Both;5 reps;Supine General Exercises - Lower Extremity Ankle Circles/Pumps: PROM;Both;5 reps;Supine Hip Flexion/Marching: PROM;Both;5 reps;Supine   Assessment/Plan    PT Assessment Patient needs continued PT services  PT Problem List Decreased activity tolerance;Decreased balance;Decreased mobility;Decreased knowledge of use of DME;Decreased safety awareness;Decreased knowledge of precautions;Cardiopulmonary status limiting activity;Pain;Decreased strength;Decreased range of motion       PT Treatment Interventions DME instruction;Functional mobility training;Therapeutic exercise;Patient/family education    PT Goals (Current goals can be found in the Care Plan section)  Acute Rehab PT Goals Patient Stated Goal: unable to state PT Goal Formulation: Patient unable to participate in goal setting Time For Goal Achievement: 03/29/20 Potential to Achieve Goals: Fair    Frequency Min 3X/week   Barriers to discharge        Co-evaluation               AM-PAC PT "6 Clicks" Mobility  Outcome Measure Help needed turning from your back to your side while in a flat bed without using bedrails?: Total Help needed moving from lying on your back to sitting on the side of a flat bed without using bedrails?: Total Help needed moving to and from a bed to a chair (including a wheelchair)?: Total Help needed standing up from a chair using your arms (e.g., wheelchair or bedside chair)?: Total Help needed to walk in hospital room?: Total Help needed climbing 3-5 steps with a railing? : Total 6 Click Score: 6    End of Session   Activity Tolerance: Patient limited by fatigue;Patient limited by lethargy Patient left: in bed;with call bell/phone within reach;with bed alarm set Nurse Communication: Mobility status PT Visit  Diagnosis: Muscle weakness (generalized) (M62.81);Pain Pain - part of body:  (chest)    Time: 3664-4034 PT Time Calculation (min) (ACUTE ONLY): 21 min   Charges:   PT Evaluation $PT Eval Moderate Complexity: 1 Mod          Babs Dabbs W,PT Acute Rehabilitation Services Pager:  [phone]  Office:  [phone]    Berline Lopes 03/15/2020, 3:04 PM

## 2020-03-15 NOTE — Progress Notes (Signed)
ANTICOAGULATION CONSULT NOTE  Pharmacy Consult for Bivalirudin Indication: ECMO + DVTs  No Known Allergies  Patient Measurements: Height: 5\' 4"  (162.6 cm) Weight: 77.5 kg (170 lb 13.7 oz) IBW/kg (Calculated) : 59.2 Heparin Dosing Weight: 72kg  Vital Signs: Temp: 98.24 F (36.8 C) (01/03 2300) Temp Source: Bladder (01/03 2000) BP: 113/64 (01/03 2300) Pulse Rate: 84 (01/03 2300)  Labs: Recent Labs    03/13/20 0314 03/13/20 0324 03/14/20 0429 03/14/20 0435 03/14/20 1634 03/14/20 1949 03/15/20 0346 03/15/20 0349 03/15/20 1009 03/15/20 1141 03/15/20 1608 03/15/20 1656 03/15/20 2009 03/15/20 2226  HGB 8.9*   < > 9.0*   < > 8.5*   < > 9.0*   < >  --    < > 8.5* 8.1* 10.5*  --   HCT 24.1*   < > 25.5*   < > 25.7*   < > 27.3*   < >  --    < > 25.0* 25.2* 31.0*  --   PLT 167   < > 139*  --  115*  --  121*  --   --   --   --  119*  --   --   APTT 44*   < > 45*   < > 54*   < > 51*  --  55*  --   --  59*  --  59*  LABPROT 17.3*  --  17.9*  --   --   --  18.8*  --   --   --   --   --   --   --   INR 1.5*  --  1.5*  --   --   --  1.6*  --   --   --   --   --   --   --   CREATININE 0.96   < > 0.88  --  0.87  --  0.86  --   --   --   --  0.94  --   --    < > = values in this interval not displayed.    Estimated Creatinine Clearance: 91.4 mL/min (by C-G formula based on SCr of 0.94 mg/dL).   Assessment: 50 yo Male admitted with COVID-19 PNA on ECMO, (+) DVT and possible PE, for bivalirudin  Goal of Therapy:  APTT 60-80 secs Monitor platelets by anticoagulation protocol: Yes   Plan:  Increase Bivalirudin to 0.23 mg/kg/hr Follow-up am labs.   57, PharmD, BCPS

## 2020-03-15 NOTE — Progress Notes (Signed)
ECMO PROGRESS NOTE  NAME:  Alex [name], MRN:  [id number], DOB:  February 22, 1971, LOS: 6 ADMISSION DATE:  03/03/2020, CONSULTATION DATE: 04-03-20 REFERRING MD: Ander Slade -LBPCCM, CHIEF COMPLAINT: Respiratory failure requiring ECMO  HPI/course in hospital  49 year old man admitted to hospital 12/28 with 1 week history of dyspnea cough nausea and vomiting.  Initially admitted to [facility] long hospital and placed on high flow nasal cannula but rapidly failed and required intubation 12/29.  Persistent hypoxic respiratory failure with PF ratio 55 in spite of 18 of PEEP FiO2 0.1 despite paralytics.  Did not improve with prone ventilation  Cannulated for VV ECMO 12/30 via right IJ crescent cannula. Iatrogenic pneumothorax from left subclavian triple-lumen placement  Family confirms no significant past medical history apart from possible prediabetes  Past Medical History  History reviewed. No pertinent past medical history.   Past Surgical History:  Procedure Laterality Date  . CENTRAL LINE INSERTION  04/03/2020   Procedure: CENTRAL LINE INSERTION;  Surgeon: Larey Dresser, MD;  Location: [place] [facility];  Service: Cardiovascular;;  . ECMO CANNULATION N/A 04/03/20   Procedure: ECMO CANNULATION;  Surgeon: Larey Dresser, MD;  Location: [place] [facility];  Service: Cardiovascular;  Laterality: N/A;  . TEE WITHOUT CARDIOVERSION  2020-04-03   Procedure: TRANSESOPHAGEAL ECHOCARDIOGRAM (TEE);  Surgeon: Larey Dresser, MD;  Location: [place] [facility];  Service: Cardiovascular;;  . [place] teeth      Interim history/subjective:  Received albumin multiple time due to chugging.  Some nausea this morning.  Objective   Blood pressure 117/65, pulse 80, temperature 98.24 F (36.8 C), resp. rate (!) 29, height 5\' 4"  (1.626 m), weight 77.5 kg, SpO2 100 %. CVP:  [8 mmHg-18 mmHg] 8 mmHg  Vent Mode: BIPAP;PCV FiO2 (%):  [60 %-100 %] 70 % Set Rate:  [18 bmp-20 bmp] 18 bmp PEEP:  [10 cmH20] 10  cmH20   Intake/Output Summary (Last 24 hours) at 03/15/2020 0657 Last data filed at 03/15/2020 0600 Gross per 24 hour  Intake 3620.11 ml  Output 3780 ml  Net -159.89 ml   Filed Weights   03/13/20 0045 03/14/20 0600 03/15/20 0530  Weight: 74.6 kg 77.5 kg 77.5 kg    ECMO Device: Cardiohelp  ECMO Mode: VV  Flow (LPM): 3.01   Examination: General: middle aged man, ill appearing on ECMO & BiPAP HEENT: [place]/AT, eye anicteric, oral mucosa moist Cardio: Regular rate and rhythm Respiratory-reduced breath sounds bilaterally.  Chest tube in place on the right-bandage clean dry and intact. No air leak. Abdomen soft, nontender, nondistended Extremities no peripheral edema.  Some mottling of left volar surface, both hands cold. Derm no rashes or wounds Neuro awake, agitated but redirectable, moving all extremities spontaneously.    CXR personally reviewed-right basilar opacity silhouetting lateral hemidiaphragm, improving ARDS  Ancillary tests (personally reviewed)  CBC: Recent Labs  Lab 03/10/20 0222 April 03, 2020 0518 2020/04/03 1804 03/12/20 0332 03/12/20 0340 03/13/20 0314 03/13/20 0324 03/13/20 1626 03/13/20 1628 03/13/20 2025 03/14/20 0429 03/14/20 0435 03/14/20 1633 03/14/20 1634 03/14/20 1949 03/15/20 0346 03/15/20 0349  WBC 6.4 5.0   < > 7.1   < > 7.6  --  9.4  --  8.3 9.4  --   --  8.5  --  13.6*  --   NEUTROABS 5.9 4.4  --  6.1  --  6.6  --   --   --   --  7.4  --   --   --   --   --   --  HGB 14.1 14.8   < > 10.5*   < > 8.9*   < > 8.3*   < > 7.6* 9.0*   < > 8.2* 8.5* 8.8* 9.0* 8.5*  HCT 40.2 44.2   < > 32.1*   < > 24.1*   < > 24.9*   < > 21.6* 25.5*   < > 24.0* 25.7* 26.0* 27.3* 25.0*  MCV 83.2 86.2   < > 83.2   < > 82.0  --  82.7  --  80.6 81.5  --   --  83.7  --  84.8  --   PLT 302 206   < > 143*   < > 167  --  158  --  145* 139*  --   --  115*  --  121*  --    < > = values in this interval not displayed.    Basic Metabolic Panel: Recent Labs  Lab 03/10/20 1849  02/12/2020 0518 02/16/2020 1804 03/12/2020 2112 02/18/2020 2116 03/12/20 1605 03/12/20 1613 03/13/20 0314 03/13/20 0324 03/13/20 1626 03/13/20 1628 03/14/20 0429 03/14/20 0435 03/14/20 1633 03/14/20 1634 03/14/20 1949 03/15/20 0346 03/15/20 0349  NA  --   --    < > 132*   < > 137   < > 137   < > 143   < > 143   < > 146* 145 145 145 144  K  --   --    < > 4.2   < > 4.4   < > 4.4   < > 4.3   < > 3.9   < > 4.1 4.2 4.1 4.2 4.0  CL  --   --    < > 101   < > 107  --  106  --  110  --  108  --   --  111  --  107  --   CO2  --   --    < > 20*   < > 23  --  23  --  25  --  26  --   --  27  --  28  --   GLUCOSE  --   --    < > 290*   < > 382*  --  362*  --  183*  --  280*  --   --  154*  --  218*  --   BUN  --   --    < > 51*   < > 49*  --  47*  --  46*  --  43*  --   --  44*  --  42*  --   CREATININE  --   --    < > 1.81*   < > 1.27*  --  0.96  --  0.87  --  0.88  --   --  0.87  --  0.86  --   CALCIUM  --   --    < > 6.1*   < > 6.7*  --  6.9*  --  7.0*  --  7.0*  --   --  7.0*  --  7.4*  --   MG 2.8* 3.3*  --  2.9*  --  2.8*  --  3.4*  --   --   --   --   --   --   --   --   --   --   PHOS 4.2 4.3  --  3.5  --  3.3  --  1.8*  --   --   --   --   --   --   --   --   --   --    < > = values in this interval not displayed.   GFR: Estimated Creatinine Clearance: 99.9 mL/min (by C-G formula based on SCr of 0.86 mg/dL). Recent Labs  Lab 02/22/2020 0830 03/10/20 0222 03/13/20 1632 03/13/20 2025 03/14/20 0429 03/14/20 1634 03/15/20 0346  PROCALCITON 18.38  --   --   --   --   --   --   WBC 6.3   < >  --  8.3 9.4 8.5 13.6*  LATICACIDVEN 1.4   < > 2.0*  --  1.9 1.7 1.9   < > = values in this interval not displayed.    Liver Function Tests: Recent Labs  Lab 03/21/2020 2112 03/12/20 0332 03/13/20 0314 03/14/20 0429 03/15/20 0346  AST 63* 53* 55* 58* 55*  ALT 48* 38 39 40 38  ALKPHOS 96 68 76 73 76  BILITOT 0.5 0.5 0.3 1.1 1.1  PROT 4.1* 3.9* 3.8* 4.0* 4.5*  ALBUMIN 2.2* 2.4* 2.4* 2.7* 3.1*    No results for input(s): LIPASE, AMYLASE in the last 168 hours. No results for input(s): AMMONIA in the last 168 hours.  ABG    Component Value Date/Time   PHART 7.436 03/15/2020 0349   PCO2ART 46.1 03/15/2020 0349   PO2ART 98 03/15/2020 0349   HCO3 31.1 (H) 03/15/2020 0349   TCO2 32 03/15/2020 0349   ACIDBASEDEF 2.0 03/12/2020 2026   O2SAT 98.0 03/15/2020 0349     Coagulation Profile: Recent Labs  Lab 2020-03-21 2112 03/12/20 0332 03/13/20 0314 03/14/20 0429 03/15/20 0346  INR 2.0* 1.5* 1.5* 1.5* 1.6*    Cardiac Enzymes: No results for input(s): CKTOTAL, CKMB, CKMBINDEX, TROPONINI in the last 168 hours.  HbA1C: Hgb A1c MFr Bld  Date/Time Value Ref Range Status  03/01/2020 06:10 PM 11.8 (H) 4.8 - 5.6 % Final    Comment:    (NOTE) Pre diabetes:          5.7%-6.4%  Diabetes:              >6.4%  Glycemic control for   <7.0% adults with diabetes   03/07/2020 08:30 AM 12.2 (H) 4.8 - 5.6 % Final    Comment:    (NOTE) Pre diabetes:          5.7%-6.4%  Diabetes:              >6.4%  Glycemic control for   <7.0% adults with diabetes     CBG: Recent Labs  Lab 03/15/20 0113 03/15/20 0216 03/15/20 0346 03/15/20 0508 03/15/20 0635  GLUCAP 182* 194* 200* 210* 188*     Assessment & Plan:   Critically ill due to acute hypoxic and hypercarbic respiratory failure requiring VV ECMO support and mechanical ventilation. ARDS due to COVID-19 viral pneumonia Pneumothorax on left Bilateral DVT , cardiac clot in transit. Likely small PE but hemodynamically stable. Concern for RLL pneumonia. -Extubated to HFNC, BIPAP as needed.  - diuresis to maintain euvolemia -Titrate ECMO blood and gas flows to normal ABG -Con't chest tube to continuous suction -Complete courses of remdesivir, corticosteroids, Actemra. -Completed empiric courses of antibiotics for community-acquired pneumonia. Adding empiric antibiotics for resistant organisms- cefepime & vanc. -Will need 3  months of anticoagulation for thromboembolic disease.  Agitation -Continue Precedex infusion -Adding Ativan 0.5  every 4 hours as needed -Adding Seroquel 50 mg twice daily -Continue enteral oxycodone -Continue Klonopin 1 mg twice daily  Hyperglycemia; uncontrolled DM PTA (A1c 12.2) -Continue insulin infusion -goal BG 140-1  Anemia Thrombocytopenia; stable -Transfuse for hemoglobin less than 8 or hemodynamically significant bleeding -Continue to monitor platelets  Leukocytosis-Concern for right lower lobe pneumonia -Panculture -Start empiric antibiotics vanc, cefepime  Daily Goals Checklist  Pain/Anxiety/Delirium protocol (if indicated): enteral oxycodone and clonazepam VAP protocol (if indicated): extubated DVT prophylaxis: Systemic bivalirudin Nutritional status and feeding goals: High nutritional risk,  tube feeds via core track GI prophylaxis: Pantoprazole Urinary catheter: Assessment of intravascular volume Central lines: Right IJ crescent, left subclavian triple-lumen, right radial arterial line Glucose control: basal bolus, SSI Mobility/therapy needs: progressive mobility Code Status: Full Family Communication: Son Sharia Reeve updated via phone.  This patient is critically ill with multiple organ system failure which requires frequent high complexity decision making, assessment, support, evaluation, and titration of therapies. This was completed through the application of advanced monitoring technologies and extensive interpretation of multiple databases. During this encounter critical care time was devoted to patient care services described in this note for 45 minutes.  Steffanie Dunn, DO 03/15/20 11:34 AM [facility]

## 2020-03-15 NOTE — Progress Notes (Signed)
ANTICOAGULATION CONSULT NOTE  Pharmacy Consult for Bivalirudin Indication: ECMO + DVTs  No Known Allergies  Patient Measurements: Height: 5\' 4"  (162.6 cm) Weight: 77.5 kg (170 lb 13.7 oz) IBW/kg (Calculated) : 59.2 Heparin Dosing Weight: 72kg  Vital Signs: Temp: 98.06 F (36.7 C) (01/03 1730) Temp Source: Bladder (01/03 1600) BP: 106/68 (01/03 1700) Pulse Rate: 80 (01/03 1730)  Labs: Recent Labs    03/13/20 0314 03/13/20 0324 03/14/20 0429 03/14/20 0435 03/14/20 1634 03/14/20 1949 03/15/20 0346 03/15/20 0349 03/15/20 1009 03/15/20 1141 03/15/20 1312 03/15/20 1513 03/15/20 1608 03/15/20 1656  HGB 8.9*   < > 9.0*   < > 8.5*   < > 9.0*   < >  --    < > 9.2* 9.2* 8.5*  --   HCT 24.1*   < > 25.5*   < > 25.7*   < > 27.3*   < >  --    < > 27.0* 27.0* 25.0*  --   PLT 167   < > 139*  --  115*  --  121*  --   --   --   --   --   --   --   APTT 44*   < > 45*   < > 54*   < > 51*  --  55*  --   --   --   --  59*  LABPROT 17.3*  --  17.9*  --   --   --  18.8*  --   --   --   --   --   --   --   INR 1.5*  --  1.5*  --   --   --  1.6*  --   --   --   --   --   --   --   CREATININE 0.96   < > 0.88  --  0.87  --  0.86  --   --   --   --   --   --   --    < > = values in this interval not displayed.    Estimated Creatinine Clearance: 99.9 mL/min (by C-G formula based on SCr of 0.86 mg/dL).   Medical History: History reviewed. No pertinent past medical history.   Assessment: 76 yoM admitted with COVID-19 PNA with worsening hypoxia now s/p cannulation for ECMO. Pt was started on IV heparin prior to cannulation due to acute DVTs and possible PE, now to transition to bivalirudin.   aPTT this evening increasing but remains below goal at 59 seconds, targeting higher goal with LDH rising and fibrin within circuit.   Goal of Therapy:  APTT 60-80 secs Monitor platelets by anticoagulation protocol: Yes   Plan:  Increase Bivalirudin to 0.19 mg/kg/hr Recheck aPTT in 4h   57, PharmD, [place], [facility] Clinical Pharmacist [phone] Please check AMION for all [facility] numbers 03/15/2020

## 2020-03-15 NOTE — Progress Notes (Signed)
   Palliative Medicine Inpatient Follow Up Note  HPI:50 y.o. male  with no significant past medical history admitted on 08-Apr-2020 with dyspnea, cough, nausea/vomiting ~ 1 week ago with worsening symptoms of body aches and fatigue and +COVID 02/07/20 and admitted with shortness of breath. Required intubation 03/10/20. Cannulated for VV ECMO 02/18/2020. Oxygenating better with ECMO. Also evidence of RLE DVT, RV thrombus, and high suspicion of PE. Has required chest tube to L lung for collapse which needs further reposition with recurrent collapse. Extubated 03-15-19  Today's Discussion (03/14/2020): Chart reviewed.   Collaborated case with team   Patient continues to  follow commands.   Extubated, utilizing BiPap, unable to tolerate HFNC at this time   I spoke to [name]/son by telephone and updated him on his father's condition.  Questions and concerns addressed   Discussed the importance of continued conversation with family and the  medical providers regarding overall plan of care and treatment options, ensuring decisions are within the context of the patients values and GOCs.  Physical Exam Vitals and nursing note reviewed.  Constitutional:      General: He is not in acute distress. Cardiovascular:     Rate and Rhythm: Normal rate.  Pulmonary:     Effort: No tachypnea, accessory muscle usage or respiratory distress.     Comments: Sedated on vent; L chest tube with serous drainage VV ECMO Abdominal:     General: Abdomen is flat.  Neurological:     Comments: follows commands, calm  SUMMARY OF RECOMMENDATIONS   Full Code / Full Scope of treatment  ECMO as a bridge to recovery  Ongoing Palliative care support  Spiritual Support - Christian, discussed with Stephanie Acre  Chaplain with PMT  Time Spent: 20 Greater than 50% of the time was spent in counseling and coordination of care ______________________________________________________________________________________ Lorinda Creed  NP [facility] Palliative Medicine Team Team Cell Phone: [phone] Please utilize secure chat with additional questions, if there is no response within 30 minutes please call the above phone number  Palliative Medicine Team providers are available by phone from 7am to 7pm daily and can be reached through the team cell phone.  Should this patient require assistance outside of these hours, please call the patient's attending physician.

## 2020-03-15 NOTE — Progress Notes (Signed)
Patient ID: Alex [name], male   DOB: March 29, 1970, 50 y.o.   MRN: [id number]     Advanced Heart Failure Rounding Note  PCP-Cardiologist: No primary care provider on file.   Subjective:    - 12/30: VV ECMO cannulation - 12/31: Left chest tube replaced - 1/2: Extubated. Echo with EF 60-65%, mildly dilated RV with mildly decreased systolic function.   CXR with bibasilar infiltrates, re-expanded left lung.  Some improvement.   Bivalirudin gtt PTT 51. More fibrin noted in circuit.   I/Os even with stable weight. Lasix 20 mg IV bid yesterday, needed albumin with chugging.   Patient now off antibiotics. Afebrile but WBCs rising.   On Bipap now with increased work of breathing and anxiety.  Dexmedetomidine gtt.   ECMO parameters: 2600 rpm Flow 3.08 L/min Pvenous -35 Delta P 14 Sweep 2 ABG 7.44/46/98/98% LDH 410 => 543 => 728 => 1041 Lactate 1.9  Objective:   Weight Range: 77.5 kg Body mass index is 29.33 kg/m.   Vital Signs:   Temp:  [97.88 F (36.6 C)-98.6 F (37 C)] 98.24 F (36.8 C) (01/03 0700) Pulse Rate:  [74-97] 85 (01/03 0700) Resp:  [5-41] 36 (01/03 0700) BP: (95-201)/(52-115) 130/79 (01/03 0700) SpO2:  [85 %-100 %] 98 % (01/03 0700) Arterial Line BP: (111-258)/(50-114) 170/70 (01/03 0700) FiO2 (%):  [60 %-100 %] 70 % (01/03 0356) Weight:  [77.5 kg] 77.5 kg (01/03 0530) Last BM Date: 03/14/20  Weight change: Filed Weights   03/13/20 0045 03/14/20 0600 03/15/20 0530  Weight: 74.6 kg 77.5 kg 77.5 kg    Intake/Output:   Intake/Output Summary (Last 24 hours) at 03/15/2020 0735 Last data filed at 03/15/2020 0700 Gross per 24 hour  Intake 3716.61 ml  Output 3780 ml  Net -63.39 ml      Physical Exam    General: Bipap Neck: No JVD, no thyromegaly or thyroid nodule.  Lungs: Decreased at bases. CV: Nondisplaced PMI.  Heart regular S1/S2, no S3/S4, no murmur.  No peripheral edema.   Abdomen: Soft, nontender, no hepatosplenomegaly, no distention.  Skin:  Intact without lesions or rashes.  Neurologic: Alert and oriented x 3.  Psych: Normal affect. Extremities: No clubbing or cyanosis.  HEENT: Normal.    Telemetry   NSR 90s (personally reviewed)  Labs    CBC Recent Labs    03/13/20 0314 03/13/20 0324 03/14/20 0429 03/14/20 0435 03/14/20 1634 03/14/20 1949 03/15/20 0346 03/15/20 0349  WBC 7.6   < > 9.4  --  8.5  --  13.6*  --   NEUTROABS 6.6  --  7.4  --   --   --   --   --   HGB 8.9*   < > 9.0*   < > 8.5*   < > 9.0* 8.5*  HCT 24.1*   < > 25.5*   < > 25.7*   < > 27.3* 25.0*  MCV 82.0   < > 81.5  --  83.7  --  84.8  --   PLT 167   < > 139*  --  115*  --  121*  --    < > = values in this interval not displayed.   Basic Metabolic Panel Recent Labs    03/12/20 1605 03/12/20 1613 03/13/20 0314 03/13/20 0324 03/14/20 1634 03/14/20 1949 03/15/20 0346 03/15/20 0349  NA 137   < > 137   < > 145   < > 145 144  K 4.4   < > 4.4   < >  4.2   < > 4.2 4.0  CL 107  --  106   < > 111  --  107  --   CO2 23  --  23   < > 27  --  28  --   GLUCOSE 382*  --  362*   < > 154*  --  218*  --   BUN 49*  --  47*   < > 44*  --  42*  --   CREATININE 1.27*  --  0.96   < > 0.87  --  0.86  --   CALCIUM 6.7*  --  6.9*   < > 7.0*  --  7.4*  --   MG 2.8*  --  3.4*  --   --   --   --   --   PHOS 3.3  --  1.8*  --   --   --   --   --    < > = values in this interval not displayed.   Liver Function Tests Recent Labs    03/14/20 0429 03/15/20 0346  AST 58* 55*  ALT 40 38  ALKPHOS 73 76  BILITOT 1.1 1.1  PROT 4.0* 4.5*  ALBUMIN 2.7* 3.1*   No results for input(s): LIPASE, AMYLASE in the last 72 hours. Cardiac Enzymes No results for input(s): CKTOTAL, CKMB, CKMBINDEX, TROPONINI in the last 72 hours.  BNP: BNP (last 3 results) No results for input(s): BNP in the last 8760 hours.  ProBNP (last 3 results) No results for input(s): PROBNP in the last 8760 hours.   D-Dimer Recent Labs    03/13/20 0314  DDIMER 9.61*   Hemoglobin  A1C No results for input(s): HGBA1C in the last 72 hours. Fasting Lipid Panel Recent Labs    03/15/20 0346  TRIG 449*   Thyroid Function Tests No results for input(s): TSH, T4TOTAL, T3FREE, THYROIDAB in the last 72 hours.  Invalid input(s): FREET3  Other results:   Imaging    DG CHEST PORT 1 VIEW  Result Date: 03/15/2020 CLINICAL DATA:  COVID EXAM: PORTABLE CHEST 1 VIEW COMPARISON:  03/14/2020 FINDINGS: Left chest tube remains in place. A feeding tube passes into the stomach although the distal tip position is not included on the film. Left subclavian central line tip overlies the mid SVC level. ECMO cannula again noted. The diffuse bilateral airspace disease is similar to prior without substantial pleural effusion. IMPRESSION: Interval extubation. No substantial interval change in cardiopulmonary exam. Electronically Signed   By: Kennith Center M.D.   On: 03/15/2020 07:18     Medications:     Scheduled Medications: . chlorhexidine  15 mL Mouth Rinse BID  . Chlorhexidine Gluconate Cloth  6 each Topical Daily  . clonazePAM  1 mg Per Tube BID  . free water  200 mL Per Tube Q6H  . furosemide  20 mg Intravenous BID  . living well with diabetes book   Does not apply Once  . mouth rinse  15 mL Mouth Rinse q12n4p  . pantoprazole (PROTONIX) IV  40 mg Intravenous QHS  . predniSONE  50 mg Oral Daily  . propranolol  40 mg Per Tube TID  . sodium chloride flush  10-40 mL Intracatheter Q12H    Infusions: . sodium chloride    . sodium chloride 10 mL/hr at 03/14/20 0600  . sodium chloride Stopped (03/12/20 0310)  . sodium chloride Stopped (03/12/20 0131)  . albumin human Stopped (03/14/20 1951)  . bivalirudin (  ANGIOMAX) infusion 0.5 mg/mL (Non-ACS indications) 0.14 mg/kg/hr (03/15/20 0700)  . ceFEPime (MAXIPIME) IV    . dexmedetomidine (PRECEDEX) IV infusion 0.5 mcg/kg/hr (03/15/20 0700)  . feeding supplement (PIVOT 1.5 CAL) 1,000 mL (03/15/20 0500)  . fentaNYL infusion INTRAVENOUS  Stopped (03/15/20 0024)  . insulin 6 mL/hr at 03/15/20 0700  . lactated ringers Stopped (02/27/2020 1522)  . norepinephrine (LEVOPHED) Adult infusion Stopped (03/12/20 1156)  . vancomycin      PRN Medications: Place/Maintain arterial line **AND** sodium chloride, sodium chloride, sodium chloride, acetaminophen, albumin human, chlorpheniramine-HYDROcodone, dextrose, fentaNYL, hydrALAZINE, labetalol, ondansetron (ZOFRAN) IV, oxyCODONE, sodium chloride flush   Assessment/Plan   1. Acute hypoxemic respiratory failure: Due to COVID-19 PNA with bilateral infiltrates.  Refractory hypoxemia, VV-ECMO cannulation on 02/20/2020 with improvement in oxygenation.  Developed left PTX post-subclavian CVL and has left chest tube, the left lung is re-expanded.  He was extubated 1/2, now on Bipap.  CXR with bibasilar infiltrates and WBCs higher. LDH higher this morning with fibrin in circuit.  - ECMO circuit functioning appropriately, good ABG this morning.  Rising LDH from ?infection versus clotting in circuit versus both.  - Increase bivalirudin, goal PTT 60-80.  - Patient has had remdesivir, tocilizumab. - Ongoing steroids with prednisone.  - Will reculture and restart antibiotics, vancomycin/cefepime.    - Gentle diuresis, Lasix 20 mg IV bid to keep even.  Will give albumin with Lasix.  2. RLE DVT/thrombus in RV/suspect PE: Echo with moderately dilated and moderately dysfunctional RV.  Clot noted on TEE in RV as well.  TTE 1/2 showed normal EF 60-65%, RV improved (mildly dilated/dysfunctional).  - Increase bivalirudin for goal PTT 60-80, has been subtherapeutic.  3. Left PTX: Left chest tube, lung is re-expanded.   4. Shock: Suspect septic/distributive.  Now resolved, off NE.  5. Anemia: Transfused 1 unit overnight.  Hgb 9, transfuse < 8.   6. AKI: Resolving, creatinine down to 0.96.  Keep I/Os even to negative.  7. Hyperglycemia: Insulin gtt today.   CRITICAL CARE Performed by: Marca Ancona  Total  critical care time: 40 minutes  Critical care time was exclusive of separately billable procedures and treating other patients.  Critical care was necessary to treat or prevent imminent or life-threatening deterioration.  Critical care was time spent personally by me on the following activities: development of treatment plan with patient and/or surrogate as well as nursing, discussions with consultants, evaluation of patient's response to treatment, examination of patient, obtaining history from patient or surrogate, ordering and performing treatments and interventions, ordering and review of laboratory studies, ordering and review of radiographic studies, pulse oximetry and re-evaluation of patient's condition.    Length of Stay: 6  Marca Ancona, MD  03/15/2020, 7:35 AM  Advanced Heart Failure Team Pager [phone] (M-F; 7a - 4p)  Please contact [facility] for night-coverage after hours (4p -7a ) and weekends on [email or web address]

## 2020-03-15 NOTE — Procedures (Signed)
Extracorporeal support note   ECLS support day: 5 Indication: Severe respiratory failure secondary to COVID-19 pneumonia with RV dysfunction  Configuration: Venovenous  Drainage cannula: 32 French crescent cannula via right IJ Return cannula: Same  Pump speed: 2600 RPM Pump flow: 2.98 L/min Pump used: Cardio help  Oxygenator: Cardio help O2 blender: 100% Sweep gas: 2   Circuit check: clot at corners. Anticoagulant: Bivalirudin Anticoagulation targets: PTT 60-80  Changes in support: Con't current ECMO support. Requires frequent meds for help with agitation- need to ensure he can maintain his airway. BiPAP PRN with HHFNC.  Anticipated goals/duration of support: Bridge to recovery.   Steffanie Dunn, DO 03/15/20 8:43 AM [facility]

## 2020-03-15 NOTE — Progress Notes (Signed)
This chaplain responded to PMT consult for spiritual care.  The Pt. consulted with PMT NP-Mary Larach and Pt. RN-Quandra before calling the Pt. son-[name] and introducing herself.  The chaplain defined the role of spiritual care for the Pt. and the family. Alex [name] requested a chaplain visit for the Pt. The chaplain learned the Pt. has a brother in [place] who will like to visit at the appropriate time.  The chaplain will plan to visit the Pt. following the healthcare team's recommendations.

## 2020-03-15 NOTE — Progress Notes (Addendum)
ANTICOAGULATION CONSULT NOTE  Pharmacy Consult for Bivalirudin Indication: ECMO + DVTs  No Known Allergies  Patient Measurements: Height: 5\' 4"  (162.6 cm) Weight: 77.5 kg (170 lb 13.7 oz) IBW/kg (Calculated) : 59.2 Heparin Dosing Weight: 72kg  Vital Signs: Temp: 98.24 F (36.8 C) (01/03 1200) Temp Source: Bladder (01/03 1200) BP: 127/77 (01/03 1200) Pulse Rate: 85 (01/03 1200)  Labs: Recent Labs    03/13/20 0314 03/13/20 0324 03/14/20 0429 03/14/20 0435 03/14/20 1634 03/14/20 1949 03/14/20 2153 03/15/20 0346 03/15/20 0349 03/15/20 0807 03/15/20 1009  HGB 8.9*   < > 9.0*   < > 8.5*   < >  --  9.0* 8.5* 9.2*  --   HCT 24.1*   < > 25.5*   < > 25.7*   < >  --  27.3* 25.0* 27.0*  --   PLT 167   < > 139*  --  115*  --   --  121*  --   --   --   APTT 44*   < > 45*   < > 54*  --  52* 51*  --   --  55*  LABPROT 17.3*  --  17.9*  --   --   --   --  18.8*  --   --   --   INR 1.5*  --  1.5*  --   --   --   --  1.6*  --   --   --   CREATININE 0.96   < > 0.88  --  0.87  --   --  0.86  --   --   --    < > = values in this interval not displayed.    Estimated Creatinine Clearance: 99.9 mL/min (by C-G formula based on SCr of 0.86 mg/dL).   Medical History: History reviewed. No pertinent past medical history.   Assessment: 48 yoM admitted with COVID-19 PNA with worsening hypoxia now s/p cannulation for ECMO. Pt was started on IV heparin prior to cannulation due to acute DVTs and possible PE, now to transition to bivalirudin.   APTT came back lower than goal range at 55, on bivalirudin@0 .14 mg/kg/hr. Hgb 8.5, plt 121. Fibrinogen 160, LDH 1041. No s/sx of bleeding or infusion issues- chest hematoma stable/resolved. Circuit stable with fibrin - not worsening.  Goal of Therapy:  APTT 60-80 secs Monitor platelets by anticoagulation protocol: Yes   Plan:  Increase Bivalirudin to 0.17 mg/kg/hr Re-check aPTT at 1700  57, PharmD, BCCCP Clinical Pharmacist  Phone:  [phone] 03/15/2020 12:24 PM  Please check AMION for all [facility] Pharmacy phone numbers After 10:00 PM, call Main Pharmacy [phone]

## 2020-03-15 NOTE — Progress Notes (Signed)
ANTICOAGULATION CONSULT NOTE  Pharmacy Consult for Bivalirudin Indication: ECMO + DVTs  No Known Allergies  Patient Measurements: Height: 5\' 4"  (162.6 cm) Weight: 77.5 kg (170 lb 13.7 oz) IBW/kg (Calculated) : 59.2 Heparin Dosing Weight: 72kg  Vital Signs: Temp: 98.6 F (37 C) (01/03 0500) BP: 153/88 (01/03 0500) Pulse Rate: 91 (01/03 0500)  Labs: Recent Labs    03/13/20 0314 03/13/20 0324 03/14/20 0429 03/14/20 0435 03/14/20 1634 03/14/20 1949 03/14/20 2153 03/15/20 0346 03/15/20 0349  HGB 8.9*   < > 9.0*   < > 8.5* 8.8*  --  9.0* 8.5*  HCT 24.1*   < > 25.5*   < > 25.7* 26.0*  --  27.3* 25.0*  PLT 167   < > 139*  --  115*  --   --  121*  --   APTT 44*   < > 45*   < > 54*  --  52* 51*  --   LABPROT 17.3*  --  17.9*  --   --   --   --  18.8*  --   INR 1.5*  --  1.5*  --   --   --   --  1.6*  --   CREATININE 0.96   < > 0.88  --  0.87  --   --  0.86  --    < > = values in this interval not displayed.    Estimated Creatinine Clearance: 99.9 mL/min (by C-G formula based on SCr of 0.86 mg/dL).   Medical History: History reviewed. No pertinent past medical history.   Assessment: 17 yoM admitted with COVID-19 PNA with worsening hypoxia now s/p cannulation for ECMO. Pt was started on IV heparin prior to cannulation due to acute DVTs and possible PE, now to transition to bivalirudin.    1/3 AM update:  APTT remains low despite multiple rate increases No issues per RN Chest hematoma stable  Goal of Therapy:  APTT 60-80 secs Monitor platelets by anticoagulation protocol: Yes   Plan:  Inc Bivalirudin to 0.14 mg/kg/hr Re-check aPTT at 1000  57, PharmD, BCPS Clinical Pharmacist Phone: [phone]

## 2020-03-16 ENCOUNTER — Inpatient Hospital Stay (HOSPITAL_COMMUNITY): Payer: Medicaid Other

## 2020-03-16 LAB — GLUCOSE, CAPILLARY
Glucose-Capillary: 143 mg/dL — ABNORMAL HIGH (ref 70–99)
Glucose-Capillary: 148 mg/dL — ABNORMAL HIGH (ref 70–99)
Glucose-Capillary: 150 mg/dL — ABNORMAL HIGH (ref 70–99)
Glucose-Capillary: 152 mg/dL — ABNORMAL HIGH (ref 70–99)
Glucose-Capillary: 154 mg/dL — ABNORMAL HIGH (ref 70–99)
Glucose-Capillary: 158 mg/dL — ABNORMAL HIGH (ref 70–99)
Glucose-Capillary: 166 mg/dL — ABNORMAL HIGH (ref 70–99)
Glucose-Capillary: 178 mg/dL — ABNORMAL HIGH (ref 70–99)
Glucose-Capillary: 180 mg/dL — ABNORMAL HIGH (ref 70–99)
Glucose-Capillary: 180 mg/dL — ABNORMAL HIGH (ref 70–99)
Glucose-Capillary: 188 mg/dL — ABNORMAL HIGH (ref 70–99)
Glucose-Capillary: 198 mg/dL — ABNORMAL HIGH (ref 70–99)
Glucose-Capillary: 212 mg/dL — ABNORMAL HIGH (ref 70–99)
Glucose-Capillary: 213 mg/dL — ABNORMAL HIGH (ref 70–99)
Glucose-Capillary: 218 mg/dL — ABNORMAL HIGH (ref 70–99)
Glucose-Capillary: 226 mg/dL — ABNORMAL HIGH (ref 70–99)
Glucose-Capillary: 233 mg/dL — ABNORMAL HIGH (ref 70–99)

## 2020-03-16 LAB — BLOOD CULTURE ID PANEL (REFLEXED) - BCID2

## 2020-03-16 LAB — POCT I-STAT 7, (LYTES, BLD GAS, ICA,H+H)
Acid-Base Excess: 6 mmol/L — ABNORMAL HIGH (ref 0.0–2.0)
Acid-Base Excess: 5 mmol/L — ABNORMAL HIGH (ref 0.0–2.0)
Acid-Base Excess: 5 mmol/L — ABNORMAL HIGH (ref 0.0–2.0)
Acid-Base Excess: 6 mmol/L — ABNORMAL HIGH (ref 0.0–2.0)
Acid-Base Excess: 3 mmol/L — ABNORMAL HIGH (ref 0.0–2.0)
Acid-Base Excess: 5 mmol/L — ABNORMAL HIGH (ref 0.0–2.0)
Bicarbonate: 30 mmol/L — ABNORMAL HIGH (ref 20.0–28.0)
Bicarbonate: 30.1 mmol/L — ABNORMAL HIGH (ref 20.0–28.0)
Bicarbonate: 29.9 mmol/L — ABNORMAL HIGH (ref 20.0–28.0)
Bicarbonate: 30.2 mmol/L — ABNORMAL HIGH (ref 20.0–28.0)
Bicarbonate: 27.6 mmol/L (ref 20.0–28.0)
Bicarbonate: 29.3 mmol/L — ABNORMAL HIGH (ref 20.0–28.0)
Calcium, Ion: 1.06 mmol/L — ABNORMAL LOW (ref 1.15–1.40)
Calcium, Ion: 1.07 mmol/L — ABNORMAL LOW (ref 1.15–1.40)
Calcium, Ion: 1.05 mmol/L — ABNORMAL LOW (ref 1.15–1.40)
Calcium, Ion: 1.08 mmol/L — ABNORMAL LOW (ref 1.15–1.40)
Calcium, Ion: 1.05 mmol/L — ABNORMAL LOW (ref 1.15–1.40)
Calcium, Ion: 1.1 mmol/L — ABNORMAL LOW (ref 1.15–1.40)
HCT: 21 % — ABNORMAL LOW (ref 39.0–52.0)
HCT: 29 % — ABNORMAL LOW (ref 39.0–52.0)
HCT: 28 % — ABNORMAL LOW (ref 39.0–52.0)
HCT: 27 % — ABNORMAL LOW (ref 39.0–52.0)
HCT: 27 % — ABNORMAL LOW (ref 39.0–52.0)
HCT: 25 % — ABNORMAL LOW (ref 39.0–52.0)
Hemoglobin: 7.1 g/dL — ABNORMAL LOW (ref 13.0–17.0)
Hemoglobin: 9.9 g/dL — ABNORMAL LOW (ref 13.0–17.0)
Hemoglobin: 9.5 g/dL — ABNORMAL LOW (ref 13.0–17.0)
Hemoglobin: 9.2 g/dL — ABNORMAL LOW (ref 13.0–17.0)
Hemoglobin: 9.2 g/dL — ABNORMAL LOW (ref 13.0–17.0)
Hemoglobin: 8.5 g/dL — ABNORMAL LOW (ref 13.0–17.0)
O2 Saturation: 93 %
O2 Saturation: 92 %
O2 Saturation: 93 %
O2 Saturation: 91 %
O2 Saturation: 89 %
O2 Saturation: 95 %
Patient temperature: 36.7
Patient temperature: 36.8
Patient temperature: 36.9
Patient temperature: 36.8
Potassium: 3.9 mmol/L (ref 3.5–5.1)
Potassium: 4 mmol/L (ref 3.5–5.1)
Potassium: 4 mmol/L (ref 3.5–5.1)
Potassium: 4.1 mmol/L (ref 3.5–5.1)
Potassium: 3.9 mmol/L (ref 3.5–5.1)
Potassium: 3.8 mmol/L (ref 3.5–5.1)
Sodium: 146 mmol/L — ABNORMAL HIGH (ref 135–145)
Sodium: 145 mmol/L (ref 135–145)
Sodium: 144 mmol/L (ref 135–145)
Sodium: 144 mmol/L (ref 135–145)
Sodium: 145 mmol/L (ref 135–145)
Sodium: 144 mmol/L (ref 135–145)
TCO2: 31 mmol/L (ref 22–32)
TCO2: 31 mmol/L (ref 22–32)
TCO2: 31 mmol/L (ref 22–32)
TCO2: 31 mmol/L (ref 22–32)
TCO2: 29 mmol/L (ref 22–32)
TCO2: 31 mmol/L (ref 22–32)
pCO2 arterial: 42.6 mmHg (ref 32.0–48.0)
pCO2 arterial: 44.4 mmHg (ref 32.0–48.0)
pCO2 arterial: 47.1 mmHg (ref 32.0–48.0)
pCO2 arterial: 42.5 mmHg (ref 32.0–48.0)
pCO2 arterial: 41.3 mmHg (ref 32.0–48.0)
pCO2 arterial: 44.1 mmHg (ref 32.0–48.0)
pH, Arterial: 7.455 — ABNORMAL HIGH (ref 7.350–7.450)
pH, Arterial: 7.438 (ref 7.350–7.450)
pH, Arterial: 7.411 (ref 7.350–7.450)
pH, Arterial: 7.458 — ABNORMAL HIGH (ref 7.350–7.450)
pH, Arterial: 7.433 (ref 7.350–7.450)
pH, Arterial: 7.431 (ref 7.350–7.450)
pO2, Arterial: 65 mmHg — ABNORMAL LOW (ref 83.0–108.0)
pO2, Arterial: 60 mmHg — ABNORMAL LOW (ref 83.0–108.0)
pO2, Arterial: 67 mmHg — ABNORMAL LOW (ref 83.0–108.0)
pO2, Arterial: 59 mmHg — ABNORMAL LOW (ref 83.0–108.0)
pO2, Arterial: 55 mmHg — ABNORMAL LOW (ref 83.0–108.0)
pO2, Arterial: 73 mmHg — ABNORMAL LOW (ref 83.0–108.0)

## 2020-03-16 LAB — EXPECTORATED SPUTUM ASSESSMENT W GRAM STAIN, RFLX TO RESP C

## 2020-03-16 LAB — ECHOCARDIOGRAM LIMITED
Area-P 1/2: 3.23 cm2
Height: 64 in
S' Lateral: 2.1 cm
Weight: 2733.7 oz

## 2020-03-16 LAB — HEPATIC FUNCTION PANEL
ALT: 42 U/L (ref 0–44)
AST: 54 U/L — ABNORMAL HIGH (ref 15–41)
Albumin: 2.9 g/dL — ABNORMAL LOW (ref 3.5–5.0)
Alkaline Phosphatase: 72 U/L (ref 38–126)
Bilirubin, Direct: 0.2 mg/dL (ref 0.0–0.2)
Indirect Bilirubin: 0.7 mg/dL (ref 0.3–0.9)
Total Bilirubin: 0.9 mg/dL (ref 0.3–1.2)
Total Protein: 4.4 g/dL — ABNORMAL LOW (ref 6.5–8.1)

## 2020-03-16 LAB — BASIC METABOLIC PANEL
Anion gap: 10 (ref 5–15)
Anion gap: 8 (ref 5–15)
BUN: 44 mg/dL — ABNORMAL HIGH (ref 6–20)
BUN: 38 mg/dL — ABNORMAL HIGH (ref 6–20)
CO2: 28 mmol/L (ref 22–32)
CO2: 27 mmol/L (ref 22–32)
Calcium: 7.2 mg/dL — ABNORMAL LOW (ref 8.9–10.3)
Calcium: 7.3 mg/dL — ABNORMAL LOW (ref 8.9–10.3)
Chloride: 107 mmol/L (ref 98–111)
Chloride: 106 mmol/L (ref 98–111)
Creatinine, Ser: 0.94 mg/dL (ref 0.61–1.24)
Creatinine, Ser: 0.86 mg/dL (ref 0.61–1.24)
GFR, Estimated: >60 mL/min (ref >60)
GFR, Estimated: >60 mL/min (ref >60)
Glucose, Bld: 213 mg/dL — ABNORMAL HIGH (ref 70–99)
Glucose, Bld: 199 mg/dL — ABNORMAL HIGH (ref 70–99)
Potassium: 3.9 mmol/L (ref 3.5–5.1)
Potassium: 4.1 mmol/L (ref 3.5–5.1)
Sodium: 145 mmol/L (ref 135–145)
Sodium: 141 mmol/L (ref 135–145)

## 2020-03-16 LAB — CBC
HCT: 22.4 % — ABNORMAL LOW (ref 39.0–52.0)
HCT: 30.1 % — ABNORMAL LOW (ref 39.0–52.0)
HCT: 27.8 % — ABNORMAL LOW (ref 39.0–52.0)
Hemoglobin: 7.1 g/dL — ABNORMAL LOW (ref 13.0–17.0)
Hemoglobin: 9.6 g/dL — ABNORMAL LOW (ref 13.0–17.0)
Hemoglobin: 9.4 g/dL — ABNORMAL LOW (ref 13.0–17.0)
MCH: 27.8 pg (ref 26.0–34.0)
MCH: 28.2 pg (ref 26.0–34.0)
MCH: 29.5 pg (ref 26.0–34.0)
MCHC: 31.7 g/dL (ref 30.0–36.0)
MCHC: 31.9 g/dL (ref 30.0–36.0)
MCHC: 33.8 g/dL (ref 30.0–36.0)
MCV: 87.8 fL (ref 80.0–100.0)
MCV: 88.5 fL (ref 80.0–100.0)
MCV: 87.1 fL (ref 80.0–100.0)
Platelets: 128 10*3/uL — ABNORMAL LOW (ref 150–400)
Platelets: 141 10*3/uL — ABNORMAL LOW (ref 150–400)
Platelets: 145 10*3/uL — ABNORMAL LOW (ref 150–400)
RBC: 2.55 MIL/uL — ABNORMAL LOW (ref 4.22–5.81)
RBC: 3.4 MIL/uL — ABNORMAL LOW (ref 4.22–5.81)
RBC: 3.19 MIL/uL — ABNORMAL LOW (ref 4.22–5.81)
RDW: 18 % — ABNORMAL HIGH (ref 11.5–15.5)
RDW: 17.3 % — ABNORMAL HIGH (ref 11.5–15.5)
RDW: 17.2 % — ABNORMAL HIGH (ref 11.5–15.5)
WBC: 12.3 10*3/uL — ABNORMAL HIGH (ref 4.0–10.5)
WBC: 16.3 10*3/uL — ABNORMAL HIGH (ref 4.0–10.5)
WBC: 17.7 10*3/uL — ABNORMAL HIGH (ref 4.0–10.5)
nRBC: 0.4 % — ABNORMAL HIGH (ref 0.0–0.2)
nRBC: 0.3 % — ABNORMAL HIGH (ref 0.0–0.2)
nRBC: 0.3 % — ABNORMAL HIGH (ref 0.0–0.2)

## 2020-03-16 LAB — URINE CULTURE: Culture: NO GROWTH

## 2020-03-16 LAB — APTT
aPTT: 62 seconds — ABNORMAL HIGH (ref 24–36)
aPTT: 58 seconds — ABNORMAL HIGH (ref 24–36)

## 2020-03-16 LAB — CULTURE, BLOOD (ROUTINE X 2): Special Requests: ADEQUATE

## 2020-03-16 LAB — PREPARE RBC (CROSSMATCH)

## 2020-03-16 LAB — PROTIME-INR
INR: 2 — ABNORMAL HIGH (ref 0.8–1.2)
Prothrombin Time: 21.6 seconds — ABNORMAL HIGH (ref 11.4–15.2)

## 2020-03-16 LAB — FIBRINOGEN: Fibrinogen: 202 mg/dL — ABNORMAL LOW (ref 210–475)

## 2020-03-16 LAB — LACTATE DEHYDROGENASE: LDH: 998 U/L — ABNORMAL HIGH (ref 98–192)

## 2020-03-16 LAB — LACTIC ACID, PLASMA
Lactic Acid, Venous: 1.9 mmol/L (ref 0.5–1.9)
Lactic Acid, Venous: 1.9 mmol/L (ref 0.5–1.9)

## 2020-03-16 MED ORDER — QUETIAPINE FUMARATE 50 MG PO TABS
50.0000 mg | ORAL_TABLET | Freq: Every day | ORAL | Status: DC
  Administered 2020-03-16: 50 mg
  Filled 2020-03-16: qty 1

## 2020-03-16 MED ORDER — SODIUM CHLORIDE 0.9% IV SOLUTION: Freq: Once | INTRAVENOUS | Status: DC

## 2020-03-16 MED ORDER — METOPROLOL TARTRATE 25 MG/10 ML ORAL SUSPENSION
25.0000 mg | Freq: Two times a day (BID) | ORAL | Status: DC
  Administered 2020-03-16 (×2): 25 mg
  Filled 2020-03-16 (×2): qty 10

## 2020-03-16 MED ORDER — FUROSEMIDE 10 MG/ML IJ SOLN
20.0000 mg | Freq: Every day | INTRAMUSCULAR | Status: DC
  Administered 2020-03-16: 20 mg via INTRAVENOUS
  Filled 2020-03-16 (×2): qty 2

## 2020-03-16 MED ORDER — QUETIAPINE FUMARATE 100 MG PO TABS: 100.0000 mg | ORAL_TABLET | Freq: Two times a day (BID) | ORAL | Status: DC

## 2020-03-16 MED ORDER — QUETIAPINE FUMARATE 100 MG PO TABS
100.0000 mg | ORAL_TABLET | Freq: Every day | ORAL | Status: DC
  Administered 2020-03-16: 100 mg
  Filled 2020-03-16: qty 1

## 2020-03-16 MED ORDER — OXYCODONE HCL 5 MG PO TABS
5.0000 mg | ORAL_TABLET | Freq: Four times a day (QID) | ORAL | Status: DC
  Administered 2020-03-16 – 2020-03-17 (×5): 5 mg
  Filled 2020-03-16 (×5): qty 1

## 2020-03-16 MED ORDER — INSULIN DETEMIR 100 UNIT/ML ~~LOC~~ SOLN
30.0000 [IU] | Freq: Two times a day (BID) | SUBCUTANEOUS | Status: DC
  Administered 2020-03-16 (×2): 30 [IU] via SUBCUTANEOUS
  Filled 2020-03-16 (×4): qty 0.3

## 2020-03-16 NOTE — Progress Notes (Signed)
PHARMACY - PHYSICIAN COMMUNICATION CRITICAL VALUE ALERT - BLOOD CULTURE IDENTIFICATION (BCID)  Alex [name] is an 50 y.o. male who presented to [facility] on 02/26/2020 with a chief complaint of acute respiratory failure requiring ECMO support and mechanical ventilation 2/2 to COVID pneumonia. New concern for bacterial pneumonia currently on cefepime/vancomycin. Blood culture now positive for coagulase neg Staph spp. In 1/4 bottles. Likely represents contamination.   Assessment:  1/4 Bcx + coag neg Staph spp.  Name of physician (or Provider) Contacted: Malva Cogan, PharmD; Karie Fetch, MD  Current antibiotics: Vancomycin + cefepime  Changes to prescribed antibiotics recommended:  Patient is on recommended antibiotics - No changes needed  Results for orders placed or performed during the hospital encounter of 02/16/2020  Blood Culture ID Panel (Reflexed) (Collected: 03/15/2020  4:18 PM)  Result Value Ref Range   Enterococcus faecalis NOT DETECTED NOT DETECTED   Enterococcus Faecium NOT DETECTED NOT DETECTED   Listeria monocytogenes NOT DETECTED NOT DETECTED   Staphylococcus species DETECTED (A) NOT DETECTED   Staphylococcus aureus (BCID) NOT DETECTED NOT DETECTED   Staphylococcus epidermidis NOT DETECTED NOT DETECTED   Staphylococcus lugdunensis NOT DETECTED NOT DETECTED   Streptococcus species NOT DETECTED NOT DETECTED   Streptococcus agalactiae NOT DETECTED NOT DETECTED   Streptococcus pneumoniae NOT DETECTED NOT DETECTED   Streptococcus pyogenes NOT DETECTED NOT DETECTED   A.calcoaceticus-baumannii NOT DETECTED NOT DETECTED   Bacteroides fragilis NOT DETECTED NOT DETECTED   Enterobacterales NOT DETECTED NOT DETECTED   Enterobacter cloacae complex NOT DETECTED NOT DETECTED   Escherichia coli NOT DETECTED NOT DETECTED   Klebsiella aerogenes NOT DETECTED NOT DETECTED   Klebsiella oxytoca NOT DETECTED NOT DETECTED   Klebsiella pneumoniae NOT DETECTED NOT DETECTED   Proteus species NOT  DETECTED NOT DETECTED   Salmonella species NOT DETECTED NOT DETECTED   Serratia marcescens NOT DETECTED NOT DETECTED   Haemophilus influenzae NOT DETECTED NOT DETECTED   Neisseria meningitidis NOT DETECTED NOT DETECTED   Pseudomonas aeruginosa NOT DETECTED NOT DETECTED   Stenotrophomonas maltophilia NOT DETECTED NOT DETECTED   Candida albicans NOT DETECTED NOT DETECTED   Candida auris NOT DETECTED NOT DETECTED   Candida glabrata NOT DETECTED NOT DETECTED   Candida krusei NOT DETECTED NOT DETECTED   Candida parapsilosis NOT DETECTED NOT DETECTED   Candida tropicalis NOT DETECTED NOT DETECTED   Cryptococcus neoformans/gattii NOT DETECTED NOT DETECTED   Margarite Gouge, PharmD PGY2 ID Pharmacy Resident [phone]  03/16/2020  3:28 PM

## 2020-03-16 NOTE — Progress Notes (Signed)
Patient ID: Alex [name], male   DOB: [birth year], 50 y.o.   MRN: [id number]     Advanced Heart Failure Rounding Note  PCP-Cardiologist: No primary care provider on file.   Subjective:    - 12/30: VV ECMO cannulation - 12/31: Left chest tube replaced - 1/2: Extubated. Echo with EF 60-65%, mildly dilated RV with mildly decreased systolic function.   CXR looks worse today.  I/Os +1609 with 1 dose IV Lasix yesterday. Has chugging with Lasix.   Bivalirudin gtt PTT 62. Less fibrin noted in circuit today.   On vanomycin/cefepime, WBCs 12.3.   On Bipap, has significant anxiety.   ECMO parameters: 2800 rpm Flow 3.48 L/min Pvenous -34 Delta P 16 Sweep 3 ABG 7.46/43/65/93% LDH 410 => 543 => 728 => 1041 => 998 Lactate 1.9  Objective:   Weight Range: 79.9 kg Body mass index is 30.24 kg/m.   Vital Signs:   Temp:  [98.06 F (36.7 C)-98.6 F (37 C)] 98.24 F (36.8 C) (01/04 0700) Pulse Rate:  [73-104] 75 (01/04 0700) Resp:  [17-42] 27 (01/04 0700) BP: (91-180)/(56-145) 117/73 (01/04 0700) SpO2:  [79 %-100 %] 95 % (01/04 0700) Arterial Line BP: (106-297)/(47-115) 156/65 (01/04 0700) FiO2 (%):  [60 %-70 %] 60 % (01/04 0600) Weight:  [79.9 kg] 79.9 kg (01/04 0530) Last BM Date: 03/15/20  Weight change: Filed Weights   03/14/20 0600 03/15/20 0530 03/16/20 0530  Weight: 77.5 kg 77.5 kg 79.9 kg    Intake/Output:   Intake/Output Summary (Last 24 hours) at 03/16/2020 0742 Last data filed at 03/16/2020 0700 Gross per 24 hour  Intake 4123.76 ml  Output 2515 ml  Net 1608.76 ml      Physical Exam    General: NAD, Bipap Neck: No JVD, no thyromegaly or thyroid nodule.  Lungs: Decreased at bases.  CV: Nondisplaced PMI.  Heart regular S1/S2, no S3/S4, no murmur.  No peripheral edema.   Abdomen: Soft, nontender, no hepatosplenomegaly, no distention.  Skin: Intact without lesions or rashes.  Neurologic: Awake on Bipap. Extremities: No clubbing or cyanosis.  HEENT: Normal.     Telemetry   NSR 80s (personally reviewed)  Labs    CBC Recent Labs    03/14/20 0429 03/14/20 0435 03/15/20 1656 03/15/20 2009 03/16/20 0427  WBC 9.4   < > 13.8*  --  12.3*  NEUTROABS 7.4  --   --   --   --   HGB 9.0*   < > 8.1* 10.5* 7.1*  7.1*  HCT 25.5*   < > 25.2* 31.0* 22.4*  21.0*  MCV 81.5   < > 87.8  --  87.8  PLT 139*   < > 119*  --  128*   < > = values in this interval not displayed.   Basic Metabolic Panel Recent Labs    03/47/42 1656 03/15/20 2009 03/16/20 0427  NA 144 145 145  146*  K 4.3 4.3 3.9  3.9  CL 106  --  107  CO2 30  --  28  GLUCOSE 150*  --  213*  BUN 45*  --  44*  CREATININE 0.94  --  0.94  CALCIUM 7.1*  --  7.2*   Liver Function Tests Recent Labs    03/15/20 0346 03/16/20 0427  AST 55* 54*  ALT 38 42  ALKPHOS 76 72  BILITOT 1.1 0.9  PROT 4.5* 4.4*  ALBUMIN 3.1* 2.9*   No results for input(s): LIPASE, AMYLASE in the last 72 hours. Cardiac Enzymes  No results for input(s): CKTOTAL, CKMB, CKMBINDEX, TROPONINI in the last 72 hours.  BNP: BNP (last 3 results) No results for input(s): BNP in the last 8760 hours.  ProBNP (last 3 results) No results for input(s): PROBNP in the last 8760 hours.   D-Dimer No results for input(s): DDIMER in the last 72 hours. Hemoglobin A1C No results for input(s): HGBA1C in the last 72 hours. Fasting Lipid Panel Recent Labs    03/15/20 0346  TRIG 449*   Thyroid Function Tests No results for input(s): TSH, T4TOTAL, T3FREE, THYROIDAB in the last 72 hours.  Invalid input(s): FREET3  Other results:   Imaging    DG CHEST PORT 1 VIEW  Result Date: 03/16/2020 CLINICAL DATA:  Respiratory failure.  ECMO.  COVID positive. EXAM: PORTABLE CHEST 1 VIEW COMPARISON:  03/15/2020.  03/14/2020.  03/13/2020. FINDINGS: Feeding tube, left central line, left chest tube in stable position. ECMO device in stable position. Heart size normal. Progressive diffuse dense bilateral pulmonary infiltrates.  Small right pleural effusion cannot be excluded. No pneumothorax. IMPRESSION: 1. Lines and tubes including left chest tube in stable position. No pneumothorax. 2. Progressive diffuse dense bilateral pulmonary infiltrates. Electronically Signed   By: Maisie Fus  Register   On: 03/16/2020 06:08     Medications:     Scheduled Medications: . sodium chloride   Intravenous Once  . chlorhexidine  15 mL Mouth Rinse BID  . Chlorhexidine Gluconate Cloth  6 each Topical Daily  . clonazePAM  1 mg Per Tube BID  . free water  200 mL Per Tube Q4H  . furosemide  20 mg Intravenous Daily  . living well with diabetes book   Does not apply Once  . mouth rinse  15 mL Mouth Rinse q12n4p  . metoprolol tartrate  25 mg Per Tube BID  . oxyCODONE  5 mg Per Tube Q6H  . pantoprazole (PROTONIX) IV  40 mg Intravenous QHS  . predniSONE  50 mg Per Tube Daily  . QUEtiapine  100 mg Per Tube BID  . sodium chloride flush  10-40 mL Intracatheter Q12H    Infusions: . sodium chloride    . sodium chloride 10 mL/hr at 03/16/20 0700  . sodium chloride 10 mL/hr at 03/16/20 0700  . sodium chloride Stopped (03/12/20 0131)  . albumin human Stopped (03/16/20 0407)  . bivalirudin (ANGIOMAX) infusion 0.5 mg/mL (Non-ACS indications) 0.23 mg/kg/hr (03/16/20 0700)  . ceFEPime (MAXIPIME) IV Stopped (03/16/20 0141)  . dexmedetomidine (PRECEDEX) IV infusion 0.4 mcg/kg/hr (03/16/20 0700)  . feeding supplement (PIVOT 1.5 CAL) 1,000 mL (03/16/20 0600)  . insulin 12 mL/hr at 03/16/20 0700  . vancomycin Stopped (03/16/20 0554)    PRN Medications: Place/Maintain arterial line **AND** sodium chloride, sodium chloride, sodium chloride, acetaminophen (TYLENOL) oral liquid 160 mg/5 mL, acetaminophen, albumin human, chlorpheniramine-HYDROcodone, dextrose, fentaNYL (SUBLIMAZE) injection, hydrALAZINE, labetalol, LORazepam, ondansetron (ZOFRAN) IV, oxyCODONE, sodium chloride flush   Assessment/Plan   1. Acute hypoxemic respiratory failure: Due  to COVID-19 PNA with bilateral infiltrates.  Refractory hypoxemia, VV-ECMO cannulation on 08-Apr-2020 with improvement in oxygenation.  Developed left PTX post-subclavian CVL and has left chest tube, the left lung is re-expanded.  He was extubated 1/2, now on Bipap.  CXR looks worse as is ABG with worsening ARDS.  He is now on vancomycin/cefepime for empiric abx coverage.  LDH lower today with antibiotics and higher PTT, less clot noted in circuit. - ECMO circuit functioning appropriately.  Will get chugging when higher flow attempted but ABG is acceptable.  -  Continue bivalirudin, goal PTT 60-80.  - Patient has had remdesivir, tocilizumab. - Ongoing steroids with prednisone.  - Continue vancomycin/cefepime.    - Gentle diuresis, Lasix 20 mg IV x 1 with blood this morning, may add afternoon dose with albumin.  2. RLE DVT/thrombus in RV/suspect PE: Echo with moderately dilated and moderately dysfunctional RV.  Clot noted on TEE in RV as well.  TTE 1/2 showed normal EF 60-65%, RV improved (mildly dilated/dysfunctional).  - Increase bivalirudin for goal PTT 60-80, has been subtherapeutic.  3. Left PTX: Left chest tube, lung is re-expanded.   4. Shock: Suspect septic/distributive.  Now resolved, off NE.  5. Anemia: Hgb 7.1, transfuse < 8.   - Transfuse 1 unit this morning. 6. AKI: Resolving, creatinine down to 0.94.  Aim for I/Os even to negative.  7. Hyperglycemia: Insulin gtt today.  8. HTN: Following cuff pressure (whip in ABG). - Start metoprolol 25 mg bid.   CRITICAL CARE Performed by: Loralie Champagne  Total critical care time: 40 minutes  Critical care time was exclusive of separately billable procedures and treating other patients.  Critical care was necessary to treat or prevent imminent or life-threatening deterioration.  Critical care was time spent personally by me on the following activities: development of treatment plan with patient and/or surrogate as well as nursing, discussions  with consultants, evaluation of patient's response to treatment, examination of patient, obtaining history from patient or surrogate, ordering and performing treatments and interventions, ordering and review of laboratory studies, ordering and review of radiographic studies, pulse oximetry and re-evaluation of patient's condition.    Length of Stay: 7  Loralie Champagne, MD  03/16/2020, 7:42 AM  Advanced Heart Failure Team Pager [phone] (M-F; 7a - 4p)  Please contact [facility] for night-coverage after hours (4p -7a ) and weekends on [email or web address]

## 2020-03-16 NOTE — Progress Notes (Signed)
Inpatient Diabetes Program Recommendations  AACE/ADA: New Consensus Statement on Inpatient Glycemic Control (2015)  Target Ranges:  Prepandial:   less than 140 mg/dL      Peak postprandial:   less than 180 mg/dL (1-2 hours)      Critically ill patients:  140 - 180 mg/dL   Lab Results  Component Value Date   GLUCAP 212 (H) 03/16/2020   HGBA1C 11.8 (H) 03-27-20    Review of Glycemic Control Results for BASSEM, BERNASCONI (MRN [id number]) as of 03/16/2020 09:34  Ref. Range 03/16/2020 06:40 03/16/2020 07:38 03/16/2020 08:40  Glucose-Capillary Latest Ref Range: 70 - 99 mg/dL 858 (H) 850 (H) 277 (H)    Diabetes history: DM2 Outpatient Diabetes medications: None Current orders for Inpatient glycemic control: IV insulin transitioning to Levemir 30 units BID Prednisone 50 mg QD Pivot 1.5 @65  ml/hr   Inpatient Diabetes Program Recommendations:    Noted plan for transition. Glucose trends and insulin needs on Endotool increased as Pivot rate increased. Would also consider adding Novolog 2-6 units Q4H under ICU protocol and Novolog 3 units Q4H for tube feed coverage (to be stopped or held in the event tube feeds held).   Thanks, , MSN, RNC-OB Diabetes Coordinator [phone] (8a-5p)

## 2020-03-16 NOTE — Progress Notes (Signed)
ANTICOAGULATION CONSULT NOTE  Pharmacy Consult for Bivalirudin Indication: ECMO + DVTs  No Known Allergies  Patient Measurements: Height: 5\' 4"  (162.6 cm) Weight: 79.9 kg (176 lb 2.4 oz) IBW/kg (Calculated) : 59.2 Heparin Dosing Weight: 72kg  Vital Signs: Temp: 98.24 F (36.8 C) (01/04 1700) Temp Source: Bladder (01/04 1200) BP: 168/88 (01/04 1700) Pulse Rate: 99 (01/04 1700)  Labs: Recent Labs    03/14/20 0429 03/14/20 0435 03/15/20 0346 03/15/20 0349 03/15/20 1656 03/15/20 2009 03/15/20 2226 03/16/20 0427 03/16/20 1120 03/16/20 1212 03/16/20 1411 03/16/20 1636 03/16/20 1639  HGB 9.0*   < > 9.0*   < > 8.1*   < >  --  7.1*  7.1* 9.6*   < > 9.5* 9.4* 9.2*  HCT 25.5*   < > 27.3*   < > 25.2*   < >  --  22.4*  21.0* 30.1*   < > 28.0* 27.8* 27.0*  PLT 139*   < > 121*  --  119*  --   --  128* 141*  --   --  145*  --   APTT 45*   < > 51*   < > 59*  --  59* 62*  --   --   --  58*  --   LABPROT 17.9*  --  18.8*  --   --   --   --  21.6*  --   --   --   --   --   INR 1.5*  --  1.6*  --   --   --   --  2.0*  --   --   --   --   --   CREATININE 0.88   < > 0.86  --  0.94  --   --  0.94  --   --   --  0.86  --    < > = values in this interval not displayed.    Estimated Creatinine Clearance: 101.4 mL/min (by C-G formula based on SCr of 0.86 mg/dL).   Medical History: History reviewed. No pertinent past medical history.   Assessment: 72 yoM admitted with COVID-19 PNA with worsening hypoxia now s/p cannulation for ECMO. Pt was started on IV heparin prior to cannulation due to acute DVTs and possible PE, now to transition to bivalirudin.   Repeat aPTT this afternoon is now below goal at 58 seconds. CBC stable, no infusion issues per RN.  Goal of Therapy:  APTT 60-80 secs Monitor platelets by anticoagulation protocol: Yes   Plan:  Increase Bivalirudin to 0.25 mg/kg/hr Recheck aPTT with am labs   59, PharmD, BCPS, [facility] Clinical  Pharmacist [phone] Please check AMION for all [facility] numbers 03/16/2020

## 2020-03-16 NOTE — Progress Notes (Signed)
ECMO PROGRESS NOTE  NAME:  Alex [name], MRN:  [id number], DOB:  [birth year], LOS: 7 ADMISSION DATE:  02/26/2020, CONSULTATION DATE: 03-18-20 REFERRING MD: Ander Slade -LBPCCM, CHIEF COMPLAINT: Respiratory failure requiring ECMO  HPI/course in hospital  50 year old man admitted to hospital 12/28 with 1 week history of dyspnea cough nausea and vomiting.  Initially admitted to [facility] and placed on high flow nasal cannula but rapidly failed and required intubation 12/29.  Persistent hypoxic respiratory failure with PF ratio 55 in spite of 18 of PEEP FiO2 0.1 despite paralytics.  Did not improve with prone ventilation  Cannulated for VV ECMO 12/30 via right IJ crescent cannula. Iatrogenic pneumothorax from left subclavian triple-lumen placement  Family confirms no significant past medical history apart from possible prediabetes  Past Medical History  none  Interim history/subjective:  Still occasionally agitated, especially when off BiPAP. Episodes of chugging again overnight.  Objective   Blood pressure 117/73, pulse 75, temperature 98.24 F (36.8 C), resp. rate (!) 27, height 5\' 4"  (1.626 m), weight 79.9 kg, SpO2 95 %. CVP:  [4 mmHg-12 mmHg] 9 mmHg  Vent Mode: BIPAP;PCV FiO2 (%):  [60 %-70 %] 60 % Set Rate:  [18 bmp] 18 bmp PEEP:  [10 cmH20] 10 cmH20   Intake/Output Summary (Last 24 hours) at 03/16/2020 0708 Last data filed at 03/16/2020 0700 Gross per 24 hour  Intake 3523.76 ml  Output 2515 ml  Net 1008.76 ml   Filed Weights   03/14/20 0600 03/15/20 0530 03/16/20 0530  Weight: 77.5 kg 77.5 kg 79.9 kg    ECMO Device: Cardiohelp  ECMO Mode: VV  Flow (LPM): 3.60   Examination: General: critically ill appearing man laying in bed in NAD on BiPAP, ECMO HEENT: [place]/AT, eyes anicteric. Mepilex on bridge of nose. Cardio: RRR, no murmurs Respiratory- Faint rhales bilaterally breathing comfortably on BiPAP, Vt ~450 on PS 14. Abdomen soft, NT, ND Extremities purple  toe distally on medial left first toe, no cyanosis. No peripheral edema. Derm : no rashes Neuro RASS -3, able to follow commands.    CXR personally reviewed- worse bilateral opacities  Ancillary tests (personally reviewed)  CBC: Recent Labs  Lab 03/10/20 0222 03-18-20 0518 18-Mar-2020 1804 03/12/20 0332 03/12/20 0340 03/13/20 0314 03/13/20 0324 03/14/20 0429 03/14/20 0435 03/14/20 1634 03/14/20 1949 03/15/20 0346 03/15/20 0349 03/15/20 1513 03/15/20 1608 03/15/20 1656 03/15/20 2009 03/16/20 0427  WBC 6.4 5.0   < > 7.1   < > 7.6   < > 9.4  --  8.5  --  13.6*  --   --   --  13.8*  --  12.3*  NEUTROABS 5.9 4.4  --  6.1  --  6.6  --  7.4  --   --   --   --   --   --   --   --   --   --   HGB 14.1 14.8   < > 10.5*   < > 8.9*   < > 9.0*   < > 8.5*   < > 9.0*   < > 9.2* 8.5* 8.1* 10.5* 7.1*  7.1*  HCT 40.2 44.2   < > 32.1*   < > 24.1*   < > 25.5*   < > 25.7*   < > 27.3*   < > 27.0* 25.0* 25.2* 31.0* 22.4*  21.0*  MCV 83.2 86.2   < > 83.2   < > 82.0   < > 81.5  --  83.7  --  84.8  --   --   --  87.8  --  87.8  PLT 302 206   < > 143*   < > 167   < > 139*  --  115*  --  121*  --   --   --  119*  --  128*   < > = values in this interval not displayed.    Basic Metabolic Panel: Recent Labs  Lab 03/10/20 1849 02/18/2020 0518 03/08/2020 1804 02/26/2020 2112 02/22/2020 2116 03/12/20 1605 03/12/20 1613 03/13/20 0314 03/13/20 0324 03/14/20 0429 03/14/20 0435 03/14/20 1634 03/14/20 1949 03/15/20 0346 03/15/20 0349 03/15/20 1513 03/15/20 1608 03/15/20 1656 03/15/20 2009 03/16/20 0427  NA  --   --    < > 132*   < > 137   < > 137   < > 143   < > 145   < > 145   < > 145 146* 144 145 145  146*  K  --   --    < > 4.2   < > 4.4   < > 4.4   < > 3.9   < > 4.2   < > 4.2   < > 4.6 4.4 4.3 4.3 3.9  3.9  CL  --   --    < > 101   < > 107  --  106   < > 108  --  111  --  107  --   --   --  106  --  107  CO2  --   --    < > 20*   < > 23  --  23   < > 26  --  27  --  28  --   --   --  30  --  28   GLUCOSE  --   --    < > 290*   < > 382*  --  362*   < > 280*  --  154*  --  218*  --   --   --  150*  --  213*  BUN  --   --    < > 51*   < > 49*  --  47*   < > 43*  --  44*  --  42*  --   --   --  45*  --  44*  CREATININE  --   --    < > 1.81*   < > 1.27*  --  0.96   < > 0.88  --  0.87  --  0.86  --   --   --  0.94  --  0.94  CALCIUM  --   --    < > 6.1*   < > 6.7*  --  6.9*   < > 7.0*  --  7.0*  --  7.4*  --   --   --  7.1*  --  7.2*  MG 2.8* 3.3*  --  2.9*  --  2.8*  --  3.4*  --   --   --   --   --   --   --   --   --   --   --   --   PHOS 4.2 4.3  --  3.5  --  3.3  --  1.8*  --   --   --   --   --   --   --   --   --   --   --   --    < > =  values in this interval not displayed.   GFR: Estimated Creatinine Clearance: 92.8 mL/min (by C-G formula based on SCr of 0.94 mg/dL). Recent Labs  Lab 03/05/2020 0830 03/10/20 0222 03/14/20 1634 03/15/20 0346 03/15/20 1656 03/15/20 1818 03/16/20 0427  PROCALCITON 18.38  --   --   --   --   --   --   WBC 6.3   < > 8.5 13.6* 13.8*  --  12.3*  LATICACIDVEN 1.4   < > 1.7 1.9  --  1.5 1.9   < > = values in this interval not displayed.    Liver Function Tests: Recent Labs  Lab 03/12/20 0332 03/13/20 0314 03/14/20 0429 03/15/20 0346 03/16/20 0427  AST 53* 55* 58* 55* 54*  ALT 38 39 40 38 42  ALKPHOS 68 76 73 76 72  BILITOT 0.5 0.3 1.1 1.1 0.9  PROT 3.9* 3.8* 4.0* 4.5* 4.4*  ALBUMIN 2.4* 2.4* 2.7* 3.1* 2.9*   No results for input(s): LIPASE, AMYLASE in the last 168 hours. No results for input(s): AMMONIA in the last 168 hours.  ABG    Component Value Date/Time   PHART 7.455 (H) 03/16/2020 0427   PCO2ART 42.6 03/16/2020 0427   PO2ART 65 (L) 03/16/2020 0427   HCO3 30.0 (H) 03/16/2020 0427   TCO2 31 03/16/2020 0427   ACIDBASEDEF 2.0 03/12/2020 2026   O2SAT 93.0 03/16/2020 0427     Coagulation Profile: Recent Labs  Lab 03/12/20 0332 03/13/20 0314 03/14/20 0429 03/15/20 0346 03/16/20 0427  INR 1.5* 1.5* 1.5* 1.6* 2.0*     Cardiac Enzymes: No results for input(s): CKTOTAL, CKMB, CKMBINDEX, TROPONINI in the last 168 hours.  HbA1C: Hgb A1c MFr Bld  Date/Time Value Ref Range Status  02/13/2020 06:10 PM 11.8 (H) 4.8 - 5.6 % Final    Comment:    (NOTE) Pre diabetes:          5.7%-6.4%  Diabetes:              >6.4%  Glycemic control for   <7.0% adults with diabetes   02/23/2020 08:30 AM 12.2 (H) 4.8 - 5.6 % Final    Comment:    (NOTE) Pre diabetes:          5.7%-6.4%  Diabetes:              >6.4%  Glycemic control for   <7.0% adults with diabetes     CBG: Recent Labs  Lab 03/16/20 0033 03/16/20 0235 03/16/20 0420 03/16/20 0559 03/16/20 0640  GLUCAP 143* 180* 198* 233* 226*     Assessment & Plan:   Critically ill due to acute hypoxic and hypercarbic respiratory failure requiring VV ECMO support and mechanical ventilation. ARDS due to COVID-19 viral pneumonia Pneumothorax on left Bilateral DVT , cardiac clot in transit. Likely small PE but hemodynamically stable. Concern for RLL pneumonia. -Continue H HFNC and BiPAP as needed.  Need to be able to come off BiPAP for breaks. Working on increasing oral sedation so he can tolerate longer before dyspnea limits him. - diuresis to maintain euvolemia-Lasix 40 mg daily -Titrate ECMO blood and gas flows to normal ABG.  Do not adjust BiPAP settings to achieve different ABG.  Please call PCCM on-call provider with questions. -Con't chest tube to continuous suction -Complete courses of remdesivir, corticosteroids, Actemra. -Completed empiric courses of antibiotics for community-acquired pneumonia.  Continue empiric antibiotics for resistant organisms- cefepime & vanc. -Will need 3 months of anticoagulation for thromboembolic disease.  Agitation -Continue Precedex  infusion; limit as possible -Con't Ativan 0.5 every 4 hours as needed -Con't Seroquel; 50 mg AM, 100mg  QPM -enteral oxycodone -Continue Klonopin 1 mg twice daily  Hyperglycemia;  uncontrolled DM PTA (A1c 12.2) -Continue insulin infusion; increasing long-acting insulin -goal BG 140-180  Acute anemia; suspect hemolytic. LDH improving. Thrombocytopenia; stable -Transfuse for hemoglobin less than 8 or hemodynamically significant bleeding- 1 unit pRBCs today -Continue to monitor platelets  Leukocytosis-Concern for right lower lobe pneumonia -blood cx 1/4 pending -Con't empiric antibiotics vanc, cefepime  Daily Goals Checklist  Pain/Anxiety/Delirium protocol (if indicated): enteral oxycodone and clonazepam VAP protocol (if indicated): extubated DVT prophylaxis: Systemic bivalirudin Nutritional status and feeding goals: High nutritional risk,  tube feeds via core track GI prophylaxis: Pantoprazole Urinary catheter: Assessment of intravascular volume Central lines: Right IJ crescent, left subclavian triple-lumen, right radial arterial line Glucose control: basal bolus, SSI Mobility/therapy needs: progressive mobility Code Status: Full Family Communication: Son updated via phone. Has a cold today. He going to get covid tested by PCP.  Multidisciplinary ECMO rounds with cardiology, pharmacy, RNs, ECMO specialists, and PCCM.  This patient is critically ill with multiple organ system failure which requires frequent high complexity decision making, assessment, support, evaluation, and titration of therapies. This was completed through the application of advanced monitoring technologies and extensive interpretation of multiple databases. During this encounter critical care time was devoted to patient care services described in this note for 45 minutes.  Sharia Reeve, DO 03/16/20 3:01 PM [facility]

## 2020-03-16 NOTE — Progress Notes (Signed)
Physical Therapy Treatment Patient Details Name: Alex [name] MRN: [id number] DOB: 1970/04/07 Today's Date: 03/16/2020    History of Present Illness 50 y.o. male  with no significant past medical history admitted on 2020-03-21 with dyspnea, cough, nausea/vomiting ~ 1 week ago with worsening symptoms of body aches and fatigue and +COVID 02/07/20 and admitted with shortness of breath. Required intubation 03/10/20. Cannulated for VV ECMO 02/25/2020. Oxygenating better with ECMO. Also evidence of RLE DVT, RV thrombus, and high suspicion of PE. Has required chest tube to L lung for collapse which needs further reposition with recurrent collapse. Extubated 03-15-19    PT Comments    Pt admitted with above diagnosis. Pt was able to sit EOB for 5 minutes with total assist of 2 persons for balance and ECMO specialist present as well.  Pt O2 saturations level stayed stable and actually incr to 92-93% once back in chair position in bed.  Pt did respond to questions but could not understand pt due to  Bipap mask. Pt did follow command to move LEs and UEs but could not move them very much at all. Pt currently with functional limitations due to balance and endurance deficits. Pt will benefit from skilled PT to increase their independence and safety with mobility to allow discharge to the venue listed below.     Follow Up Recommendations  CIR;Supervision/Assistance - 24 hour     Equipment Recommendations  Other (comment) (TBA)    Recommendations for Other Services       Precautions / Restrictions Precautions Precautions: Fall Precaution Comments: ECMO, Heated HFNC, chest tube Restrictions Weight Bearing Restrictions: No    Mobility  Bed Mobility Overal bed mobility: Needs Assistance Bed Mobility: Rolling;Sidelying to Sit Rolling: Total assist         General bed mobility comments: Pt needed a little assist for roling to place pillow under pt. Total assist to scoot up in bed.  Transfers                  General transfer comment: TBA  Ambulation/Gait             General Gait Details: TBA   Stairs             Wheelchair Mobility    Modified Rankin (Stroke Patients Only)       Balance Overall balance assessment: Needs assistance Sitting-balance support: No upper extremity supported;Feet supported Sitting balance-Leahy Scale: Zero Sitting balance - Comments: Pt sat a total of 5 minutes at EOB with Ecmo specialist and nurse with PT in room.  Pt needed total assist of 2 as he cannot sit up on his own. Tried to kick LE but only got a little of foot movement.  Pt also extended neck to command.  Saturations at EOB 87-92% on Bipap with other VSS.  Incr RR and nurse asked for pt to lie down after 5 min at EOB.                                    Cognition Arousal/Alertness: Lethargic;Suspect due to medications Behavior During Therapy: Flat affect Overall Cognitive Status: Difficult to assess                                        Exercises General Exercises - Upper Extremity Shoulder Flexion: PROM;Both;5 reps;Supine  Elbow Flexion: PROM;Both;5 reps;Supine Elbow Extension: PROM;Both;5 reps;Supine General Exercises - Lower Extremity Ankle Circles/Pumps: PROM;Both;5 reps;Supine Heel Slides: PROM;Both;10 reps;Supine    General Comments General comments (skin integrity, edema, etc.): HR stable, Sats 87-92% on Bipap      Pertinent Vitals/Pain      Home Living                      Prior Function            PT Goals (current goals can now be found in the care plan section) Acute Rehab PT Goals Patient Stated Goal: unable to state Progress towards PT goals: Progressing toward goals    Frequency    Min 3X/week      PT Plan Current plan remains appropriate    Co-evaluation              AM-PAC PT "6 Clicks" Mobility   Outcome Measure  Help needed turning from your back to your side while in a  flat bed without using bedrails?: Total Help needed moving from lying on your back to sitting on the side of a flat bed without using bedrails?: Total Help needed moving to and from a bed to a chair (including a wheelchair)?: Total Help needed standing up from a chair using your arms (e.g., wheelchair or bedside chair)?: Total Help needed to walk in hospital room?: Total Help needed climbing 3-5 steps with a railing? : Total 6 Click Score: 6    End of Session Equipment Utilized During Treatment: Other (comment) (Bipap) Activity Tolerance: Patient limited by fatigue;Patient limited by lethargy Patient left: in bed;with call bell/phone within reach;with bed alarm set;with restraints reapplied;with nursing/sitter in room Nurse Communication: Mobility status PT Visit Diagnosis: Muscle weakness (generalized) (M62.81);Pain Pain - part of body:  (chest)     Time: 7517-0017 PT Time Calculation (min) (ACUTE ONLY): 29 min  Charges:  $Therapeutic Exercise: 8-22 mins $Therapeutic Activity: 8-22 mins                     Gabrian Hoque W,PT Acute Rehabilitation Services Pager:  [phone]  Office:  [phone]     Berline Lopes 03/16/2020, 12:42 PM

## 2020-03-16 NOTE — Progress Notes (Signed)
Inpatient Rehab Admissions Coordinator Note:   Per therapy recommendations, pt was screened for CIR candidacy by Estill Dooms, PT, DPT.  At this time note pt on ECMO.  Will follow from a distance.  No consult needed at this time.  Please contact me with questions.   Estill Dooms, PT, DPT [phone] 03/16/20 11:47 AM

## 2020-03-16 NOTE — Procedures (Signed)
Extracorporeal support note  ECLS cannulation date: 12/30 ECLS support day: 6 Last circuit change: n/a  Indication: Severe respiratory failure secondary to COVID-19 pneumonia with RV dysfunction  Configuration: Venovenous  Drainage cannula: 32 French crescent cannula via right IJ Return cannula: Same  Pump speed: 2800 RPM Pump flow: 3.4 L/min Pump used: Cardio help  Oxygenator: Cardio help O2 blender: 100% Sweep gas: 3L  Circuit check: clot at corners Anticoagulant: Bivalirudin Anticoagulation targets: PTT 60-80  Changes in support: Con't current ECMO support. Requires frequent meds for help with agitation- need to ensure he can maintain his airway. BiPAP PRN with HHFNC. Adding long-acting insulin.  Anticipated goals/duration of support: Bridge to recovery.   Steffanie Dunn, DO 03/16/20 8:46 AM [facility]

## 2020-03-16 NOTE — Progress Notes (Signed)
This chaplain is present outside the Pt. room for F/U spiritual care requested by the Pt. son-Josh.   A moment of silence was observed by the chaplain before prayer for the Pt. and healthcare team.  F/U spiritual care is available as needed.

## 2020-03-16 NOTE — Progress Notes (Signed)
ANTICOAGULATION CONSULT NOTE  Pharmacy Consult for Bivalirudin Indication: ECMO + DVTs  No Known Allergies  Patient Measurements: Height: 5\' 4"  (162.6 cm) Weight: 79.9 kg (176 lb 2.4 oz) IBW/kg (Calculated) : 59.2 Heparin Dosing Weight: 72kg  Vital Signs: Temp: 98.24 F (36.8 C) (01/04 0700) Temp Source: Bladder (01/04 0600) BP: 117/73 (01/04 0700) Pulse Rate: 75 (01/04 0700)  Labs: Recent Labs    03/14/20 0429 03/14/20 0435 03/15/20 0346 03/15/20 0349 03/15/20 1656 03/15/20 2009 03/15/20 2226 03/16/20 0427  HGB 9.0*   < > 9.0*   < > 8.1* 10.5*  --  7.1*  7.1*  HCT 25.5*   < > 27.3*   < > 25.2* 31.0*  --  22.4*  21.0*  PLT 139*   < > 121*  --  119*  --   --  128*  APTT 45*   < > 51*   < > 59*  --  59* 62*  LABPROT 17.9*  --  18.8*  --   --   --   --  21.6*  INR 1.5*  --  1.6*  --   --   --   --  2.0*  CREATININE 0.88   < > 0.86  --  0.94  --   --  0.94   < > = values in this interval not displayed.    Estimated Creatinine Clearance: 92.8 mL/min (by C-G formula based on SCr of 0.94 mg/dL).   Medical History: History reviewed. No pertinent past medical history.   Assessment: 26 yoM admitted with COVID-19 PNA with worsening hypoxia now s/p cannulation for ECMO. Pt was started on IV heparin prior to cannulation due to acute DVTs and possible PE, now to transition to bivalirudin.   aPTT this morning now therapeutic at 62, on bivalirudin at 0.32 mg/kg/hr, have been targeting higher goal with LDH rising and fibrin within circuit. LDH down slightly to 998, fibrinogen 202, Hgb 7.1, plt 128. Put 100 mL out on chest tube.  Goal of Therapy:  APTT 60-80 secs Monitor platelets by anticoagulation protocol: Yes   Plan:  Increase Bivalirudin to 0.23 mg/kg/hr Recheck aPTT at 1700 Monitor daily aPTT, CBC, LDH, and for s/sx of bleeding  57, PharmD, BCCCP Clinical Pharmacist  Phone: [phone] 03/16/2020 7:26 AM  Please check AMION for all [facility] phone  numbers After 10:00 PM, call Main Pharmacy [phone]

## 2020-03-17 ENCOUNTER — Inpatient Hospital Stay (HOSPITAL_COMMUNITY): Payer: Medicaid Other

## 2020-03-17 DIAGNOSIS — D649 Anemia, unspecified: Secondary | ICD-10-CM

## 2020-03-17 DIAGNOSIS — I361 Nonrheumatic tricuspid (valve) insufficiency: Secondary | ICD-10-CM

## 2020-03-17 DIAGNOSIS — R0603 Acute respiratory distress: Secondary | ICD-10-CM

## 2020-03-17 DIAGNOSIS — R209 Unspecified disturbances of skin sensation: Secondary | ICD-10-CM

## 2020-03-17 DIAGNOSIS — I50811 Acute right heart failure: Secondary | ICD-10-CM

## 2020-03-17 DIAGNOSIS — R579 Shock, unspecified: Secondary | ICD-10-CM

## 2020-03-17 DIAGNOSIS — J181 Lobar pneumonia, unspecified organism: Secondary | ICD-10-CM

## 2020-03-17 DIAGNOSIS — Z9281 Personal history of extracorporeal membrane oxygenation (ECMO): Secondary | ICD-10-CM

## 2020-03-17 LAB — POCT I-STAT 7, (LYTES, BLD GAS, ICA,H+H)
Acid-Base Excess: 5 mmol/L — ABNORMAL HIGH (ref 0.0–2.0)
Acid-Base Excess: 1 mmol/L (ref 0.0–2.0)
Acid-Base Excess: 3 mmol/L — ABNORMAL HIGH (ref 0.0–2.0)
Acid-Base Excess: 5 mmol/L — ABNORMAL HIGH (ref 0.0–2.0)
Acid-Base Excess: 5 mmol/L — ABNORMAL HIGH (ref 0.0–2.0)
Acid-Base Excess: 3 mmol/L — ABNORMAL HIGH (ref 0.0–2.0)
Acid-Base Excess: 1 mmol/L (ref 0.0–2.0)
Acid-Base Excess: 4 mmol/L — ABNORMAL HIGH (ref 0.0–2.0)
Acid-Base Excess: 5 mmol/L — ABNORMAL HIGH (ref 0.0–2.0)
Acid-base deficit: 2 mmol/L (ref 0.0–2.0)
Acid-base deficit: 1 mmol/L (ref 0.0–2.0)
Bicarbonate: 30.2 mmol/L — ABNORMAL HIGH (ref 20.0–28.0)
Bicarbonate: 27.5 mmol/L (ref 20.0–28.0)
Bicarbonate: 28.6 mmol/L — ABNORMAL HIGH (ref 20.0–28.0)
Bicarbonate: 29.6 mmol/L — ABNORMAL HIGH (ref 20.0–28.0)
Bicarbonate: 28.5 mmol/L — ABNORMAL HIGH (ref 20.0–28.0)
Bicarbonate: 27.1 mmol/L (ref 20.0–28.0)
Bicarbonate: 22.6 mmol/L (ref 20.0–28.0)
Bicarbonate: 26 mmol/L (ref 20.0–28.0)
Bicarbonate: 23.3 mmol/L (ref 20.0–28.0)
Bicarbonate: 27.2 mmol/L (ref 20.0–28.0)
Bicarbonate: 28.5 mmol/L — ABNORMAL HIGH (ref 20.0–28.0)
Calcium, Ion: 1.07 mmol/L — ABNORMAL LOW (ref 1.15–1.40)
Calcium, Ion: 1.03 mmol/L — ABNORMAL LOW (ref 1.15–1.40)
Calcium, Ion: 1.06 mmol/L — ABNORMAL LOW (ref 1.15–1.40)
Calcium, Ion: 1.09 mmol/L — ABNORMAL LOW (ref 1.15–1.40)
Calcium, Ion: 1.07 mmol/L — ABNORMAL LOW (ref 1.15–1.40)
Calcium, Ion: 1.01 mmol/L — ABNORMAL LOW (ref 1.15–1.40)
Calcium, Ion: 1.04 mmol/L — ABNORMAL LOW (ref 1.15–1.40)
Calcium, Ion: 1.08 mmol/L — ABNORMAL LOW (ref 1.15–1.40)
Calcium, Ion: 1.02 mmol/L — ABNORMAL LOW (ref 1.15–1.40)
Calcium, Ion: 1.09 mmol/L — ABNORMAL LOW (ref 1.15–1.40)
Calcium, Ion: 1.04 mmol/L — ABNORMAL LOW (ref 1.15–1.40)
HCT: 28 % — ABNORMAL LOW (ref 39.0–52.0)
HCT: 26 % — ABNORMAL LOW (ref 39.0–52.0)
HCT: 26 % — ABNORMAL LOW (ref 39.0–52.0)
HCT: 26 % — ABNORMAL LOW (ref 39.0–52.0)
HCT: 26 % — ABNORMAL LOW (ref 39.0–52.0)
HCT: 26 % — ABNORMAL LOW (ref 39.0–52.0)
HCT: 25 % — ABNORMAL LOW (ref 39.0–52.0)
HCT: 26 % — ABNORMAL LOW (ref 39.0–52.0)
HCT: 25 % — ABNORMAL LOW (ref 39.0–52.0)
HCT: 25 % — ABNORMAL LOW (ref 39.0–52.0)
HCT: 22 % — ABNORMAL LOW (ref 39.0–52.0)
Hemoglobin: 9.5 g/dL — ABNORMAL LOW (ref 13.0–17.0)
Hemoglobin: 8.8 g/dL — ABNORMAL LOW (ref 13.0–17.0)
Hemoglobin: 8.8 g/dL — ABNORMAL LOW (ref 13.0–17.0)
Hemoglobin: 8.8 g/dL — ABNORMAL LOW (ref 13.0–17.0)
Hemoglobin: 8.8 g/dL — ABNORMAL LOW (ref 13.0–17.0)
Hemoglobin: 8.8 g/dL — ABNORMAL LOW (ref 13.0–17.0)
Hemoglobin: 8.5 g/dL — ABNORMAL LOW (ref 13.0–17.0)
Hemoglobin: 8.8 g/dL — ABNORMAL LOW (ref 13.0–17.0)
Hemoglobin: 8.5 g/dL — ABNORMAL LOW (ref 13.0–17.0)
Hemoglobin: 8.5 g/dL — ABNORMAL LOW (ref 13.0–17.0)
Hemoglobin: 7.5 g/dL — ABNORMAL LOW (ref 13.0–17.0)
O2 Saturation: 94 %
O2 Saturation: 100 %
O2 Saturation: 100 %
O2 Saturation: 76 %
O2 Saturation: 99 %
O2 Saturation: 100 %
O2 Saturation: 67 %
O2 Saturation: 81 %
O2 Saturation: 98 %
O2 Saturation: 100 %
O2 Saturation: 100 %
Patient temperature: 36.9
Patient temperature: 36.8
Patient temperature: 36.8
Patient temperature: 36.9
Patient temperature: 36.6
Patient temperature: 36.5
Patient temperature: 36.6
Patient temperature: 36.8
Patient temperature: 36.6
Patient temperature: 98.6
Patient temperature: 36.6
Potassium: 3.9 mmol/L (ref 3.5–5.1)
Potassium: 4.1 mmol/L (ref 3.5–5.1)
Potassium: 4.1 mmol/L (ref 3.5–5.1)
Potassium: 4.2 mmol/L (ref 3.5–5.1)
Potassium: 4.4 mmol/L (ref 3.5–5.1)
Potassium: 4 mmol/L (ref 3.5–5.1)
Potassium: 4.3 mmol/L (ref 3.5–5.1)
Potassium: 4.7 mmol/L (ref 3.5–5.1)
Potassium: 4.2 mmol/L (ref 3.5–5.1)
Potassium: 4.4 mmol/L (ref 3.5–5.1)
Potassium: 4.2 mmol/L (ref 3.5–5.1)
Sodium: 146 mmol/L — ABNORMAL HIGH (ref 135–145)
Sodium: 144 mmol/L (ref 135–145)
Sodium: 145 mmol/L (ref 135–145)
Sodium: 146 mmol/L — ABNORMAL HIGH (ref 135–145)
Sodium: 146 mmol/L — ABNORMAL HIGH (ref 135–145)
Sodium: 146 mmol/L — ABNORMAL HIGH (ref 135–145)
Sodium: 145 mmol/L (ref 135–145)
Sodium: 146 mmol/L — ABNORMAL HIGH (ref 135–145)
Sodium: 144 mmol/L (ref 135–145)
Sodium: 144 mmol/L (ref 135–145)
Sodium: 145 mmol/L (ref 135–145)
TCO2: 32 mmol/L (ref 22–32)
TCO2: 29 mmol/L (ref 22–32)
TCO2: 30 mmol/L (ref 22–32)
TCO2: 31 mmol/L (ref 22–32)
TCO2: 30 mmol/L (ref 22–32)
TCO2: 28 mmol/L (ref 22–32)
TCO2: 24 mmol/L (ref 22–32)
TCO2: 27 mmol/L (ref 22–32)
TCO2: 24 mmol/L (ref 22–32)
TCO2: 28 mmol/L (ref 22–32)
TCO2: 30 mmol/L (ref 22–32)
pCO2 arterial: 45.4 mmHg (ref 32.0–48.0)
pCO2 arterial: 42.4 mmHg (ref 32.0–48.0)
pCO2 arterial: 46.9 mmHg (ref 32.0–48.0)
pCO2 arterial: 51.7 mmHg — ABNORMAL HIGH (ref 32.0–48.0)
pCO2 arterial: 35.4 mmHg (ref 32.0–48.0)
pCO2 arterial: 37.3 mmHg (ref 32.0–48.0)
pCO2 arterial: 37.5 mmHg (ref 32.0–48.0)
pCO2 arterial: 43.8 mmHg (ref 32.0–48.0)
pCO2 arterial: 36.6 mmHg (ref 32.0–48.0)
pCO2 arterial: 35.8 mmHg (ref 32.0–48.0)
pCO2 arterial: 36.5 mmHg (ref 32.0–48.0)
pH, Arterial: 7.43 (ref 7.350–7.450)
pH, Arterial: 7.334 — ABNORMAL LOW (ref 7.350–7.450)
pH, Arterial: 7.393 (ref 7.350–7.450)
pH, Arterial: 7.451 — ABNORMAL HIGH (ref 7.350–7.450)
pH, Arterial: 7.513 — ABNORMAL HIGH (ref 7.350–7.450)
pH, Arterial: 7.467 — ABNORMAL HIGH (ref 7.350–7.450)
pH, Arterial: 7.381 (ref 7.350–7.450)
pH, Arterial: 7.387 (ref 7.350–7.450)
pH, Arterial: 7.41 (ref 7.350–7.450)
pH, Arterial: 7.489 — ABNORMAL HIGH (ref 7.350–7.450)
pH, Arterial: 7.499 — ABNORMAL HIGH (ref 7.350–7.450)
pO2, Arterial: 70 mmHg — ABNORMAL LOW (ref 83.0–108.0)
pO2, Arterial: 251 mmHg — ABNORMAL HIGH (ref 83.0–108.0)
pO2, Arterial: 44 mmHg — ABNORMAL LOW (ref 83.0–108.0)
pO2, Arterial: 455 mmHg — ABNORMAL HIGH (ref 83.0–108.0)
pO2, Arterial: 109 mmHg — ABNORMAL HIGH (ref 83.0–108.0)
pO2, Arterial: 564 mmHg — ABNORMAL HIGH (ref 83.0–108.0)
pO2, Arterial: 35 mmHg — CL (ref 83.0–108.0)
pO2, Arterial: 44 mmHg — ABNORMAL LOW (ref 83.0–108.0)
pO2, Arterial: 110 mmHg — ABNORMAL HIGH (ref 83.0–108.0)
pO2, Arterial: 236 mmHg — ABNORMAL HIGH (ref 83.0–108.0)
pO2, Arterial: 278 mmHg — ABNORMAL HIGH (ref 83.0–108.0)

## 2020-03-17 LAB — BASIC METABOLIC PANEL
Anion gap: 9 (ref 5–15)
Anion gap: 10 (ref 5–15)
BUN: 37 mg/dL — ABNORMAL HIGH (ref 6–20)
BUN: 49 mg/dL — ABNORMAL HIGH (ref 6–20)
CO2: 29 mmol/L (ref 22–32)
CO2: 21 mmol/L — ABNORMAL LOW (ref 22–32)
Calcium: 7.3 mg/dL — ABNORMAL LOW (ref 8.9–10.3)
Calcium: 6.9 mg/dL — ABNORMAL LOW (ref 8.9–10.3)
Chloride: 106 mmol/L (ref 98–111)
Chloride: 109 mmol/L (ref 98–111)
Creatinine, Ser: 0.84 mg/dL (ref 0.61–1.24)
Creatinine, Ser: 1.29 mg/dL — ABNORMAL HIGH (ref 0.61–1.24)
GFR, Estimated: >60 mL/min (ref >60)
GFR, Estimated: >60 mL/min (ref >60)
Glucose, Bld: 156 mg/dL — ABNORMAL HIGH (ref 70–99)
Glucose, Bld: 234 mg/dL — ABNORMAL HIGH (ref 70–99)
Potassium: 3.8 mmol/L (ref 3.5–5.1)
Potassium: 4.2 mmol/L (ref 3.5–5.1)
Sodium: 144 mmol/L (ref 135–145)
Sodium: 140 mmol/L (ref 135–145)

## 2020-03-17 LAB — CBC
HCT: 28.4 % — ABNORMAL LOW (ref 39.0–52.0)
HCT: 28.3 % — ABNORMAL LOW (ref 39.0–52.0)
HCT: 25.6 % — ABNORMAL LOW (ref 39.0–52.0)
Hemoglobin: 9.6 g/dL — ABNORMAL LOW (ref 13.0–17.0)
Hemoglobin: 8.8 g/dL — ABNORMAL LOW (ref 13.0–17.0)
Hemoglobin: 8.6 g/dL — ABNORMAL LOW (ref 13.0–17.0)
MCH: 29.5 pg (ref 26.0–34.0)
MCH: 28.5 pg (ref 26.0–34.0)
MCH: 29.8 pg (ref 26.0–34.0)
MCHC: 33.8 g/dL (ref 30.0–36.0)
MCHC: 31.1 g/dL (ref 30.0–36.0)
MCHC: 33.6 g/dL (ref 30.0–36.0)
MCV: 87.4 fL (ref 80.0–100.0)
MCV: 91.6 fL (ref 80.0–100.0)
MCV: 88.6 fL (ref 80.0–100.0)
Platelets: 149 10*3/uL — ABNORMAL LOW (ref 150–400)
Platelets: 127 10*3/uL — ABNORMAL LOW (ref 150–400)
Platelets: 140 10*3/uL — ABNORMAL LOW (ref 150–400)
RBC: 3.25 MIL/uL — ABNORMAL LOW (ref 4.22–5.81)
RBC: 3.09 MIL/uL — ABNORMAL LOW (ref 4.22–5.81)
RBC: 2.89 MIL/uL — ABNORMAL LOW (ref 4.22–5.81)
RDW: 17.7 % — ABNORMAL HIGH (ref 11.5–15.5)
RDW: 18.2 % — ABNORMAL HIGH (ref 11.5–15.5)
RDW: 18.5 % — ABNORMAL HIGH (ref 11.5–15.5)
WBC: 17.9 10*3/uL — ABNORMAL HIGH (ref 4.0–10.5)
WBC: 15.3 10*3/uL — ABNORMAL HIGH (ref 4.0–10.5)
WBC: 21.3 10*3/uL — ABNORMAL HIGH (ref 4.0–10.5)
nRBC: 0.5 % — ABNORMAL HIGH (ref 0.0–0.2)
nRBC: 0.8 % — ABNORMAL HIGH (ref 0.0–0.2)
nRBC: 0.9 % — ABNORMAL HIGH (ref 0.0–0.2)

## 2020-03-17 LAB — BPAM RBC
Blood Product Expiration Date: 202201202359
Blood Product Expiration Date: 202201202359
Blood Product Expiration Date: 202201212359
Blood Product Expiration Date: 202201232359
ISSUE DATE / TIME: 202201040530
Unit Type and Rh: 7300
Unit Type and Rh: 7300
Unit Type and Rh: 7300
Unit Type and Rh: 7300

## 2020-03-17 LAB — APTT
aPTT: 53 seconds — ABNORMAL HIGH (ref 24–36)
aPTT: 69 seconds — ABNORMAL HIGH (ref 24–36)
aPTT: 50 seconds — ABNORMAL HIGH (ref 24–36)
aPTT: 60 seconds — ABNORMAL HIGH (ref 24–36)

## 2020-03-17 LAB — GLUCOSE, CAPILLARY
Glucose-Capillary: 129 mg/dL — ABNORMAL HIGH (ref 70–99)
Glucose-Capillary: 136 mg/dL — ABNORMAL HIGH (ref 70–99)
Glucose-Capillary: 145 mg/dL — ABNORMAL HIGH (ref 70–99)
Glucose-Capillary: 153 mg/dL — ABNORMAL HIGH (ref 70–99)
Glucose-Capillary: 157 mg/dL — ABNORMAL HIGH (ref 70–99)
Glucose-Capillary: 189 mg/dL — ABNORMAL HIGH (ref 70–99)
Glucose-Capillary: 177 mg/dL — ABNORMAL HIGH (ref 70–99)
Glucose-Capillary: 114 mg/dL — ABNORMAL HIGH (ref 70–99)
Glucose-Capillary: 99 mg/dL (ref 70–99)
Glucose-Capillary: 170 mg/dL — ABNORMAL HIGH (ref 70–99)
Glucose-Capillary: 226 mg/dL — ABNORMAL HIGH (ref 70–99)
Glucose-Capillary: 304 mg/dL — ABNORMAL HIGH (ref 70–99)
Glucose-Capillary: 217 mg/dL — ABNORMAL HIGH (ref 70–99)
Glucose-Capillary: 178 mg/dL — ABNORMAL HIGH (ref 70–99)
Glucose-Capillary: 158 mg/dL — ABNORMAL HIGH (ref 70–99)
Glucose-Capillary: 105 mg/dL — ABNORMAL HIGH (ref 70–99)
Glucose-Capillary: 79 mg/dL (ref 70–99)
Glucose-Capillary: 80 mg/dL (ref 70–99)
Glucose-Capillary: 81 mg/dL (ref 70–99)

## 2020-03-17 LAB — TYPE AND SCREEN
ABO/RH(D): B POS
Antibody Screen: NEGATIVE
Unit division: 0
Unit division: 0
Unit division: 0
Unit division: 0

## 2020-03-17 LAB — FIBRINOGEN: Fibrinogen: 248 mg/dL (ref 210–475)

## 2020-03-17 LAB — HEPATIC FUNCTION PANEL
ALT: 43 U/L (ref 0–44)
ALT: 40 U/L (ref 0–44)
AST: 59 U/L — ABNORMAL HIGH (ref 15–41)
AST: 54 U/L — ABNORMAL HIGH (ref 15–41)
Albumin: 2.9 g/dL — ABNORMAL LOW (ref 3.5–5.0)
Albumin: 3 g/dL — ABNORMAL LOW (ref 3.5–5.0)
Alkaline Phosphatase: 123 U/L (ref 38–126)
Alkaline Phosphatase: 114 U/L (ref 38–126)
Bilirubin, Direct: 0.3 mg/dL — ABNORMAL HIGH (ref 0.0–0.2)
Bilirubin, Direct: 0.3 mg/dL — ABNORMAL HIGH (ref 0.0–0.2)
Indirect Bilirubin: 1.2 mg/dL — ABNORMAL HIGH (ref 0.3–0.9)
Indirect Bilirubin: 1.2 mg/dL — ABNORMAL HIGH (ref 0.3–0.9)
Total Bilirubin: 1.5 mg/dL — ABNORMAL HIGH (ref 0.3–1.2)
Total Bilirubin: 1.5 mg/dL — ABNORMAL HIGH (ref 0.3–1.2)
Total Protein: 4.7 g/dL — ABNORMAL LOW (ref 6.5–8.1)
Total Protein: 4.6 g/dL — ABNORMAL LOW (ref 6.5–8.1)

## 2020-03-17 LAB — PROTIME-INR
INR: 1.8 — ABNORMAL HIGH (ref 0.8–1.2)
INR: 1.7 — ABNORMAL HIGH (ref 0.8–1.2)
Prothrombin Time: 19.9 seconds — ABNORMAL HIGH (ref 11.4–15.2)
Prothrombin Time: 19.2 seconds — ABNORMAL HIGH (ref 11.4–15.2)

## 2020-03-17 LAB — LACTATE DEHYDROGENASE
LDH: 1305 U/L — ABNORMAL HIGH (ref 98–192)
LDH: 1128 U/L — ABNORMAL HIGH (ref 98–192)

## 2020-03-17 LAB — PHOSPHORUS: Phosphorus: 2.4 mg/dL — ABNORMAL LOW (ref 2.5–4.6)

## 2020-03-17 LAB — MAGNESIUM: Magnesium: 2.8 mg/dL — ABNORMAL HIGH (ref 1.7–2.4)

## 2020-03-17 LAB — LACTIC ACID, PLASMA
Lactic Acid, Venous: 1.8 mmol/L (ref 0.5–1.9)
Lactic Acid, Venous: 2.7 mmol/L (ref 0.5–1.9)

## 2020-03-17 LAB — ECHOCARDIOGRAM LIMITED
Height: 64 in
Weight: 2839.52 oz

## 2020-03-17 MED ORDER — SENNOSIDES 8.8 MG/5ML PO SYRP
10.0000 mL | ORAL_SOLUTION | Freq: Every day | ORAL | Status: DC
  Administered 2020-03-17 – 2020-03-20 (×4): 10 mL
  Filled 2020-03-17 (×4): qty 10

## 2020-03-17 MED ORDER — ETOMIDATE 2 MG/ML IV SOLN: 10.0000 mg | Freq: Once | INTRAVENOUS | Status: AC

## 2020-03-17 MED ORDER — ALTEPLASE (PULMONARY EMBOLISM) INFUSION
100.0000 mg | Freq: Once | INTRAVENOUS | Status: DC
  Filled 2020-03-17: qty 100

## 2020-03-17 MED ORDER — NOREPINEPHRINE 16 MG/250ML-% IV SOLN
0.0000 ug/min | INTRAVENOUS | Status: DC
  Filled 2020-03-17: qty 250

## 2020-03-17 MED ORDER — NOREPINEPHRINE 4 MG/250ML-% IV SOLN
0.0000 ug/min | INTRAVENOUS | Status: DC
  Filled 2020-03-17: qty 250

## 2020-03-17 MED ORDER — VECURONIUM BROMIDE 10 MG IV SOLR
0.1000 mg/kg | Freq: Once | INTRAVENOUS | Status: AC
  Administered 2020-03-17: 8.1 mg via INTRAVENOUS

## 2020-03-17 MED ORDER — FENTANYL CITRATE (PF) 100 MCG/2ML IJ SOLN
25.0000 ug | Freq: Once | INTRAMUSCULAR | Status: AC
  Administered 2020-03-17: 25 ug via INTRAVENOUS

## 2020-03-17 MED ORDER — GUAIFENESIN-DM 100-10 MG/5ML PO SYRP
10.0000 mL | ORAL_SOLUTION | Freq: Two times a day (BID) | ORAL | Status: DC
  Administered 2020-03-17 – 2020-04-09 (×47): 10 mL
  Filled 2020-03-17 (×47): qty 10

## 2020-03-17 MED ORDER — MIDAZOLAM HCL 2 MG/2ML IJ SOLN
INTRAMUSCULAR | Status: AC
  Filled 2020-03-17: qty 4

## 2020-03-17 MED ORDER — VECURONIUM BROMIDE 10 MG IV SOLR
INTRAVENOUS | Status: AC
  Filled 2020-03-17: qty 10

## 2020-03-17 MED ORDER — EPINEPHRINE HCL 5 MG/250ML IV SOLN IN NS
0.5000 ug/min | INTRAVENOUS | Status: DC
  Filled 2020-03-17: qty 250

## 2020-03-17 MED ORDER — ROCURONIUM BROMIDE 50 MG/5ML IV SOLN
80.0000 mg | Freq: Once | INTRAVENOUS | Status: AC
  Administered 2020-03-17: 80 mg via INTRAVENOUS

## 2020-03-17 MED ORDER — ORAL CARE MOUTH RINSE
15.0000 mL | OROMUCOSAL | Status: DC
  Administered 2020-03-17 – 2020-04-16 (×296): 15 mL via OROMUCOSAL

## 2020-03-17 MED ORDER — OXYCODONE HCL 5 MG PO TABS
10.0000 mg | ORAL_TABLET | Freq: Four times a day (QID) | ORAL | Status: DC
  Administered 2020-03-18 – 2020-03-20 (×10): 10 mg
  Filled 2020-03-17 (×10): qty 2

## 2020-03-17 MED ORDER — PANTOPRAZOLE SODIUM 40 MG PO PACK
40.0000 mg | PACK | Freq: Every day | ORAL | Status: DC
  Administered 2020-03-17 – 2020-05-05 (×50): 40 mg
  Filled 2020-03-17 (×51): qty 20

## 2020-03-17 MED ORDER — QUETIAPINE FUMARATE 100 MG PO TABS
100.0000 mg | ORAL_TABLET | Freq: Two times a day (BID) | ORAL | Status: DC
  Administered 2020-03-17 – 2020-03-19 (×6): 100 mg
  Filled 2020-03-17 (×7): qty 1

## 2020-03-17 MED ORDER — CHLORHEXIDINE GLUCONATE 0.12% ORAL RINSE (MEDLINE KIT)
15.0000 mL | Freq: Two times a day (BID) | OROMUCOSAL | Status: DC
  Administered 2020-03-17 – 2020-04-16 (×56): 15 mL via OROMUCOSAL

## 2020-03-17 MED ORDER — ROCURONIUM BROMIDE 10 MG/ML (PF) SYRINGE
PREFILLED_SYRINGE | INTRAVENOUS | Status: AC
  Filled 2020-03-17: qty 10

## 2020-03-17 MED ORDER — SODIUM CHLORIDE 0.9 % IV SOLN
250.0000 mL | Freq: Once | INTRAVENOUS | Status: AC
  Administered 2020-03-18: 250 mL via INTRAVENOUS

## 2020-03-17 MED ORDER — METOPROLOL TARTRATE 25 MG/10 ML ORAL SUSPENSION
50.0000 mg | Freq: Two times a day (BID) | ORAL | Status: DC
  Filled 2020-03-17: qty 20

## 2020-03-17 MED ORDER — POTASSIUM CHLORIDE 20 MEQ PO PACK
40.0000 meq | PACK | Freq: Once | ORAL | Status: AC
  Administered 2020-03-17: 40 meq
  Filled 2020-03-17: qty 2

## 2020-03-17 MED ORDER — DOPAMINE-DEXTROSE 3.2-5 MG/ML-% IV SOLN: 2.0000 ug/kg/min | INTRAVENOUS | Status: DC

## 2020-03-17 MED ORDER — NOREPINEPHRINE 4 MG/250ML-% IV SOLN
INTRAVENOUS | Status: AC
  Filled 2020-03-17: qty 250

## 2020-03-17 MED ORDER — VASOPRESSIN 20 UNITS/100 ML INFUSION FOR SHOCK
0.0000 [IU]/min | INTRAVENOUS | Status: DC
  Administered 2020-03-17: 0.03 [IU]/min via INTRAVENOUS
  Filled 2020-03-17: qty 100

## 2020-03-17 MED ORDER — FENTANYL CITRATE (PF) 100 MCG/2ML IJ SOLN
INTRAMUSCULAR | Status: AC
  Filled 2020-03-17: qty 2

## 2020-03-17 MED ORDER — ZIPRASIDONE MESYLATE 20 MG IM SOLR: 10.0000 mg | INTRAMUSCULAR | Status: DC | PRN

## 2020-03-17 MED ORDER — GUAIFENESIN 100 MG/5ML PO SOLN
5.0000 mL | ORAL | Status: DC | PRN
  Administered 2020-03-29 – 2020-03-30 (×2): 100 mg via ORAL
  Filled 2020-03-17 (×2): qty 5

## 2020-03-17 MED ORDER — INSULIN DETEMIR 100 UNIT/ML ~~LOC~~ SOLN
35.0000 [IU] | Freq: Two times a day (BID) | SUBCUTANEOUS | Status: DC
  Administered 2020-03-17 – 2020-03-18 (×2): 35 [IU] via SUBCUTANEOUS
  Filled 2020-03-17 (×6): qty 0.35

## 2020-03-17 MED ORDER — HALOPERIDOL LACTATE 5 MG/ML IJ SOLN
2.0000 mg | Freq: Four times a day (QID) | INTRAMUSCULAR | Status: DC | PRN
  Administered 2020-03-17 – 2020-04-03 (×4): 2 mg via INTRAVENOUS
  Filled 2020-03-17 (×4): qty 1

## 2020-03-17 MED ORDER — FENTANYL BOLUS VIA INFUSION
100.0000 ug | Freq: Once | INTRAVENOUS | Status: AC
  Administered 2020-03-17: 100 ug via INTRAVENOUS

## 2020-03-17 MED ORDER — ETOMIDATE 2 MG/ML IV SOLN
INTRAVENOUS | Status: AC
  Administered 2020-03-17: 10 mg via INTRAVENOUS
  Filled 2020-03-17: qty 20

## 2020-03-17 MED ORDER — FENTANYL 2500MCG IN NS 250ML (10MCG/ML) PREMIX INFUSION
0.0000 ug/h | INTRAVENOUS | Status: DC
  Administered 2020-03-17: 250 ug/h via INTRAVENOUS
  Administered 2020-03-18: 200 ug/h via INTRAVENOUS
  Administered 2020-03-19: 300 ug/h via INTRAVENOUS
  Administered 2020-03-19: 200 ug/h via INTRAVENOUS
  Administered 2020-03-19: 250 ug/h via INTRAVENOUS
  Administered 2020-03-20: 200 ug/h via INTRAVENOUS
  Administered 2020-03-20: 325 ug/h via INTRAVENOUS
  Administered 2020-03-21: 300 ug/h via INTRAVENOUS
  Administered 2020-03-21: 250 ug/h via INTRAVENOUS
  Administered 2020-03-21: 300 ug/h via INTRAVENOUS
  Administered 2020-03-21: 250 ug/h via INTRAVENOUS
  Administered 2020-03-22: 175 ug/h via INTRAVENOUS
  Administered 2020-03-23: 200 ug/h via INTRAVENOUS
  Administered 2020-03-23: 150 ug/h via INTRAVENOUS
  Administered 2020-03-24 – 2020-03-25 (×3): 200 ug/h via INTRAVENOUS
  Filled 2020-03-17 (×20): qty 250

## 2020-03-17 MED ORDER — SORBITOL 70 % SOLN
30.0000 mL | Freq: Once | Status: AC
  Administered 2020-03-17: 30 mL
  Filled 2020-03-17: qty 30

## 2020-03-17 MED ORDER — CLONAZEPAM 1 MG PO TABS
1.0000 mg | ORAL_TABLET | Freq: Three times a day (TID) | ORAL | Status: DC
  Administered 2020-03-17 – 2020-03-21 (×12): 1 mg
  Filled 2020-03-17 (×12): qty 1

## 2020-03-17 MED ORDER — DOCUSATE SODIUM 50 MG/5ML PO LIQD
100.0000 mg | Freq: Two times a day (BID) | ORAL | Status: DC
  Administered 2020-03-17 – 2020-05-06 (×91): 100 mg
  Filled 2020-03-17 (×95): qty 10

## 2020-03-17 MED ORDER — FUROSEMIDE 10 MG/ML IJ SOLN
40.0000 mg | Freq: Two times a day (BID) | INTRAMUSCULAR | Status: DC
  Administered 2020-03-17 – 2020-03-18 (×3): 40 mg via INTRAVENOUS
  Filled 2020-03-17 (×3): qty 4

## 2020-03-17 NOTE — Progress Notes (Signed)
ECMO PROGRESS NOTE  NAME:  Alex [name], MRN:  [id number], DOB:  Dec 31, 1970, LOS: 8 ADMISSION DATE:  02/26/2020, CONSULTATION DATE: 03/08/2020 REFERRING MD: Wynona Neat -LBPCCM, CHIEF COMPLAINT: Respiratory failure requiring ECMO  HPI/course in hospital  50 year old man admitted to hospital 12/28 with 1 week history of dyspnea cough nausea and vomiting.  Initially admitted to [facility] long hospital and placed on high flow nasal cannula but rapidly failed and required intubation 12/29.  Persistent hypoxic respiratory failure with PF ratio 55 in spite of 18 of PEEP FiO2 0.1 despite paralytics.  Did not improve with prone ventilation  Cannulated for VV ECMO 12/30 via right IJ crescent cannula. Iatrogenic pneumothorax from left subclavian triple-lumen placement  Family confirms no significant past medical history apart from possible prediabetes  Past Medical History  none  Interim history/subjective:  More agitated overnight.   Objective   Blood pressure (!) 171/86, pulse 100, temperature 98.24 F (36.8 C), resp. rate (!) 41, height 5\' 4"  (1.626 m), weight 80.5 kg, SpO2 95 %. CVP:  [9 mmHg-16 mmHg] 9 mmHg  Vent Mode: PCV;BIPAP FiO2 (%):  [50 %] 50 % Set Rate:  [18 bmp] 18 bmp PEEP:  [10 cmH20] 10 cmH20   Intake/Output Summary (Last 24 hours) at 03/17/2020 0700 Last data filed at 03/17/2020 0600 Gross per 24 hour  Intake 5678.68 ml  Output 3425 ml  Net 2253.68 ml   Filed Weights   03/15/20 0530 03/16/20 0530 03/17/20 0400  Weight: 77.5 kg 79.9 kg 80.5 kg    ECMO Device: Cardiohelp  ECMO Mode: VV  Flow (LPM): 3.54   Examination: General: critically ill, agitated man laying in bed on BiPAP, ECMO HEENT: [place]/AT, eyes anicteric, oral mucosa dry. Cortrak. Cardio: tachyardic, regular rhythm Respiratory: tachypneic, increased work of breathing but responsive to bolus meds Abdomen soft, NT, ND Extremities no cyanosis or clubbing Derm : no rashes or wounds.  Neuro: RASS +3, moving  all extremities on command, redirectable.    CXR personally reviewed- bilateral opacities, stable since yesterday  Ancillary tests (personally reviewed)  CBC: Recent Labs  Lab 02/13/2020 0518 03/08/2020 1804 03/12/20 0332 03/12/20 0340 03/13/20 0314 03/13/20 0324 03/14/20 0429 03/14/20 0435 03/15/20 1656 03/15/20 2009 03/16/20 0427 03/16/20 1120 03/16/20 1212 03/16/20 1636 03/16/20 1639 03/16/20 1949 03/16/20 2309 03/17/20 0349  WBC 5.0   < > 7.1   < > 7.6   < > 9.4   < > 13.8*  --  12.3* 16.3*  --  17.7*  --   --   --  17.9*  NEUTROABS 4.4  --  6.1  --  6.6  --  7.4  --   --   --   --   --   --   --   --   --   --   --   HGB 14.8   < > 10.5*   < > 8.9*   < > 9.0*   < > 8.1*   < > 7.1*  7.1* 9.6*   < > 9.4* 9.2* 9.2* 8.5* 9.5*  9.6*  HCT 44.2   < > 32.1*   < > 24.1*   < > 25.5*   < > 25.2*   < > 22.4*  21.0* 30.1*   < > 27.8* 27.0* 27.0* 25.0* 28.0*  28.4*  MCV 86.2   < > 83.2   < > 82.0   < > 81.5   < > 87.8  --  87.8 88.5  --  87.1  --   --   --  87.4  PLT 206   < > 143*   < > 167   < > 139*   < > 119*  --  128* 141*  --  145*  --   --   --  149*   < > = values in this interval not displayed.    Basic Metabolic Panel: Recent Labs  Lab 03/12/2020 0518 03/10/2020 1804 03/12/2020 2112 02/14/2020 2116 03/12/20 1605 03/12/20 1613 03/13/20 0314 03/13/20 0324 03/15/20 0346 03/15/20 0349 03/15/20 1656 03/15/20 2009 03/16/20 0427 03/16/20 1212 03/16/20 1636 03/16/20 1639 03/16/20 1949 03/16/20 2309 03/17/20 0349  NA  --    < > 132*   < > 137   < > 137   < > 145   < > 144   < > 145  146*   < > 141 144 145 144 144  146*  K  --    < > 4.2   < > 4.4   < > 4.4   < > 4.2   < > 4.3   < > 3.9  3.9   < > 4.1 4.1 3.9 3.8 3.8  3.9  CL  --    < > 101   < > 107  --  106   < > 107  --  106  --  107  --  106  --   --   --  106  CO2  --    < > 20*   < > 23  --  23   < > 28  --  30  --  28  --  27  --   --   --  29  GLUCOSE  --    < > 290*   < > 382*  --  362*   < > 218*  --  150*   --  213*  --  199*  --   --   --  156*  BUN  --    < > 51*   < > 49*  --  47*   < > 42*  --  45*  --  44*  --  38*  --   --   --  37*  CREATININE  --    < > 1.81*   < > 1.27*  --  0.96   < > 0.86  --  0.94  --  0.94  --  0.86  --   --   --  0.84  CALCIUM  --    < > 6.1*   < > 6.7*  --  6.9*   < > 7.4*  --  7.1*  --  7.2*  --  7.3*  --   --   --  7.3*  MG 3.3*  --  2.9*  --  2.8*  --  3.4*  --   --   --   --   --   --   --   --   --   --   --  2.8*  PHOS 4.3  --  3.5  --  3.3  --  1.8*  --   --   --   --   --   --   --   --   --   --   --  2.4*   < > = values in this interval not displayed.   GFR: Estimated Creatinine Clearance:  104.1 mL/min (by C-G formula based on SCr of 0.84 mg/dL). Recent Labs  Lab 03/15/20 1818 03/16/20 0427 03/16/20 1120 03/16/20 1636 03/17/20 0349  WBC  --  12.3* 16.3* 17.7* 17.9*  LATICACIDVEN 1.5 1.9  --  1.9 1.8    Liver Function Tests: Recent Labs  Lab 03/13/20 0314 03/14/20 0429 03/15/20 0346 03/16/20 0427 03/17/20 0349  AST 55* 58* 55* 54* 59*  ALT 39 40 38 42 43  ALKPHOS 76 73 76 72 123  BILITOT 0.3 1.1 1.1 0.9 1.5*  PROT 3.8* 4.0* 4.5* 4.4* 4.7*  ALBUMIN 2.4* 2.7* 3.1* 2.9* 2.9*   No results for input(s): LIPASE, AMYLASE in the last 168 hours. No results for input(s): AMMONIA in the last 168 hours.  ABG    Component Value Date/Time   PHART 7.430 03/17/2020 0349   PCO2ART 45.4 03/17/2020 0349   PO2ART 70 (L) 03/17/2020 0349   HCO3 30.2 (H) 03/17/2020 0349   TCO2 32 03/17/2020 0349   ACIDBASEDEF 2.0 03/12/2020 2026   O2SAT 94.0 03/17/2020 0349     Coagulation Profile: Recent Labs  Lab 03/13/20 0314 03/14/20 0429 03/15/20 0346 03/16/20 0427 03/17/20 0349  INR 1.5* 1.5* 1.6* 2.0* 1.8*    Cardiac Enzymes: No results for input(s): CKTOTAL, CKMB, CKMBINDEX, TROPONINI in the last 168 hours.  HbA1C: Hgb A1c MFr Bld  Date/Time Value Ref Range Status  03/08/2020 06:10 PM 11.8 (H) 4.8 - 5.6 % Final    Comment:    (NOTE) Pre  diabetes:          5.7%-6.4%  Diabetes:              >6.4%  Glycemic control for   <7.0% adults with diabetes   02/29/2020 08:30 AM 12.2 (H) 4.8 - 5.6 % Final    Comment:    (NOTE) Pre diabetes:          5.7%-6.4%  Diabetes:              >6.4%  Glycemic control for   <7.0% adults with diabetes     CBG: Recent Labs  Lab 03/17/20 0126 03/17/20 0209 03/17/20 0346 03/17/20 0424 03/17/20 0600  GLUCAP 129* 136* 153* 145* 157*     Assessment & Plan:   Critically ill due to acute hypoxic and hypercarbic respiratory failure requiring VV ECMO support and mechanical ventilation. ARDS due to COVID-19 viral pneumonia Pneumothorax on left Bilateral DVT , cardiac clot in transit. Likely small PE but hemodynamically stable. Concern for RLL pneumonia. -HHF; BiPAP PRN. Increasing pain and agitation meds. - diuresis to maintain euvolemia-Lasix 40 mg;  -Titrate ECMO blood and gas flows to normal ABG.  Do not adjust BiPAP settings to change ABG.  Please call PCCM on-call provider with questions. -Con't chest tube to continuous suction. -Complete courses of remdesivir, corticosteroids, Actemra. -Completed empiric courses of antibiotics for community-acquired pneumonia. Continue empiric antibiotics for resistant organisms- cefepime & vanc. -Will need 3 months of anticoagulation for thromboembolic disease. -Pre & post-ABG. Recheck LDH, LFTs, CBC at noon.  Agitation -Continue Precedex infusion; limit as possible -Con't Ativan 0.5 every 4 hours as needed -Increase Seroquel 100 mg BID -increase enteral oxycodone + PRN. Fentanyl PRN for breakthrough. -Increase Klonopin to 1 mg TID -adding haldol PRN   Hyperglycemia; uncontrolled DM PTA (A1c 12.2) -Continue insulin infusion; increasing long-acting insulin to 35 BID -goal BG 140-180  Acute anemia; suspect hemolytic. LDH improving. Thrombocytopenia; stable -Transfuse for hemoglobin less than 8 or hemodynamically significant  bleeding -Continue  to monitor platelets  Leukocytosis-Concern for right lower lobe pneumonia, likely bacteremia -blood cx 1/4 pending- CONSin 3/4 bottles -Con't empiric antibiotics vanc, cefepime  Daily Goals Checklist  Pain/Anxiety/Delirium protocol (if indicated): enteral oxycodone and clonazepam, precedex, seroquel VAP protocol (if indicated): extubated DVT prophylaxis: Systemic bivalirudin Nutritional status and feeding goals: High nutritional risk, tube feeds via core track GI prophylaxis: Pantoprazole Urinary catheter: Assessment of intravascular volume Central lines: Right IJ crescent, left subclavian triple-lumen, right radial arterial line Glucose control: basal bolus, insulin gtt Mobility/therapy needs: progressive mobility Code Status: Full Family Communication: will update son this afternoon  Multidisciplinary ECMO rounds with cardiology, pharmacy, RNs, ECMO specialists, and PCCM.  This patient is critically ill with multiple organ system failure which requires frequent high complexity decision making, assessment, support, evaluation, and titration of therapies. This was completed through the application of advanced monitoring technologies and extensive interpretation of multiple databases. During this encounter critical care time was devoted to patient care services described in this note for 60 minutes.  Steffanie Dunn, DO 03/17/20 8:34 AM [facility]

## 2020-03-17 NOTE — Progress Notes (Signed)
Low saturations this morning- correlated with multiple pulse oximeters. Pre, post circuit gases confirmed appropriate circuit function and patients ABG with low PaO2 correlating with SpO2. TTE confirmed dilated RV, catheter in appropriate position. Due to concern for PE, TPA orderd. Epinephrine infusion ordered in addition to NE and vasopressin. In 3rd degree heart block. iNO started 10ppm. TPA infusing with improvement in SpO2 60-> 70%. TTE performed with appearance of cannula location distally in the IVC. Pulled back about 1.5cm under direct TTE guidance. Resutured into place x 4 and sterile dressing reapplied. Heart block persistent. Coming down on vasopressors.  Repeat ABG in 1 h.  Repeat CXR Limited TTE tomorrow to reassess RV. Half dose of TPA given. Stopped due to nosebleed after 50mg . Will hold on additional 50mg . May need nasal packing.  Cc time: 60 min  , DO 03/17/20 3:23 PM  [facility]

## 2020-03-17 NOTE — Progress Notes (Signed)
Bilateral rhinorockets placed to ensure resolution of epistaxis overnight to allow for safe AC. RASS -3, nodded yes and no to appropriate questions.   Multiple family members updated via phone- brother, friend, son.  Steffanie Dunn, DO 03/17/20 8:04 PM [facility]

## 2020-03-17 NOTE — Progress Notes (Signed)
OT Cancellation Note  Patient Details Name: Alex [name] MRN: [id number] DOB: 26-Oct-1970   Cancelled Treatment:    Reason Eval/Treat Not Completed: Medical issues which prohibited therapy.  Re-intubated 1/5, will continue efforts as appropriate.    Marney Treloar D Makilah Dowda 03/17/2020, 12:53 PM

## 2020-03-17 NOTE — Progress Notes (Signed)
This chaplain checked in with the Pt. healthcare team and was  present for prayer outside of the Pt. room.

## 2020-03-17 NOTE — Progress Notes (Addendum)
ANTICOAGULATION CONSULT NOTE  Pharmacy Consult for Bivalirudin Indication: ECMO + DVTs  No Known Allergies  Patient Measurements: Height: 5\' 4"  (162.6 cm) Weight: 80.5 kg (177 lb 7.5 oz) IBW/kg (Calculated) : 59.2 Heparin Dosing Weight: 72kg  Vital Signs: Temp: 98.06 F (36.7 C) (01/05 1700) Temp Source: Bladder (01/05 1200) BP: 112/59 (01/05 1600) Pulse Rate: 88 (01/05 1700)  Labs: Recent Labs    03/16/20 0427 03/16/20 1120 03/16/20 1636 03/16/20 1639 03/17/20 0349 03/17/20 0904 03/17/20 1224 03/17/20 1252 03/17/20 1310 03/17/20 1608 03/17/20 1621  HGB 7.1*  7.1*   < > 9.4*   < > 9.5*  9.6*   < > 8.8*   < > 8.5* 8.5* 8.6*  HCT 22.4*  21.0*   < > 27.8*   < > 28.0*  28.4*   < > 28.3*   < > 25.0* 25.0* 25.6*  PLT 128*   < > 145*  --  149*  --  127*  --   --   --  140*  APTT 62*  --  58*  --  53*  --  69*  --   --   --  50*  LABPROT 21.6*  --   --   --  19.9*  --   --   --   --   --  19.2*  INR 2.0*  --   --   --  1.8*  --   --   --   --   --  1.7*  CREATININE 0.94  --  0.86  --  0.84  --   --   --   --   --   --    < > = values in this interval not displayed.    Estimated Creatinine Clearance: 104.1 mL/min (by C-G formula based on SCr of 0.84 mg/dL).   Medical History: History reviewed. No pertinent past medical history.   Assessment: 19 yoM admitted with COVID-19 PNA with worsening hypoxia now s/p cannulation for ECMO. Pt was started on IV heparin prior to cannulation due to acute DVTs and possible PE, now to transition to bivalirudin.   Pt decompensated this afternoon with RV enlargement and worsening hypoxia so tPA started with concern for PE. Pt developed nose bleed and O2 sats improved so only 50mg  tPA given. Pharmacy to resume bivalirudin once aPTT < 80s. Afternoon aPTT is 50 seconds, CBC stable. Given thrombolytic will reduce aPTT goal x24.  Goal of Therapy:  APTT 50-60 seconds - s/p tPA Monitor platelets by anticoagulation protocol: Yes   Plan:   -Restart bivalirudin at 0.25 mg/kg/hr - lowering rate since aPTT slightly elevated still -Check aPTT in 4h  ADDENDUM: Repeat aPTT after restarting is 60 seconds. Will keep current infusion rate and recheck in am.  57, PharmD, BCPS, [facility] Clinical Pharmacist [phone] Please check AMION for all [facility] Pharmacy numbers 03/17/2020

## 2020-03-17 NOTE — CV Procedure (Signed)
Procedure: TEE  Sedation: Per CCM  Indication: ECMO cannula position  Findings: Please see echo section for full report.  Normal LV size with EF 60-65% and mild LV hypertrophy.  The RV was moderately dilated and moderate-severely dysfunctional.  There was mild TR.  Initially, the ECMO cannula outflow was located outside the RA in the IVC.  The cannula was withdrawn under TEE guidance until the outflow was appropriately in the right atrium.   Alex [name] 03/17/2020 3:10 PM

## 2020-03-17 NOTE — Progress Notes (Signed)
Patient ID: Alex [name], male   DOB: 04-29-1970, 50 y.o.   MRN: [id number]  Called to bedside by CCM to help evaluate patient with marked drop in oxygen saturation this afternoon as well as hypotension and arrhythmias (heart block).    Patient's oxygen saturation had been stable this morning.  No apparent cannula position movement.  Post-oxygenator PaO2 was excellent so oxygenator functioning appropriately.  Pressors titrated up and TTE was done.  The RV was mod-severely dilated with mod-severe systolic dysfunction.  It was difficult to assess the ECMO cannula position. Given DVT and PE this admission, the patient was started on thrombolytics.   TEE was done, on TEE it was clear that the cannula position was too deep.  The cannula was withdrawn under TEE guidance to correct position and oxygen saturation increased to the 90s.  We were able to titrate down pressors after this and rhythm stabilized. Thrombolytics stopped before full infusion.   CRITICAL CARE Performed by: Marca Ancona  Total critical care time: 45 minutes  Critical care time was exclusive of separately billable procedures and treating other patients.  Critical care was necessary to treat or prevent imminent or life-threatening deterioration.  Critical care was time spent personally by me on the following activities: development of treatment plan with patient and/or surrogate as well as nursing, discussions with consultants, evaluation of patient's response to treatment, examination of patient, obtaining history from patient or surrogate, ordering and performing treatments and interventions, ordering and review of laboratory studies, ordering and review of radiographic studies, pulse oximetry and re-evaluation of patient's condition.   Marca Ancona 03/17/2020 3:08 PM

## 2020-03-17 NOTE — Procedures (Signed)
Extracorporeal support note  ECLS cannulation date: 12/30 ECLS support day: 7 Last circuit change: n/a  Indication: Severe respiratory failure secondary to COVID-19 pneumonia with RV dysfunction  Configuration: Venovenous  Drainage cannula: 32 French crescent cannula via right IJ Return cannula: Same  Pump speed: 3200 RPM Pump flow: 3.86 L/min Pump used: Cardio help  Oxygenator: Cardio help O2 blender: 100% Sweep gas: 3L  Circuit check: clot at corners- worse on the left today Anticoagulant: Bivalirudin Anticoagulation targets: PTT 60-80  Changes in support: Con't current ECMO support. Off bipap this morning titrating up on agitation meds. BiPAP PRN with HHFNC. Increase long-acting insulin. Con't antibiotics. Pre-post- ABGs and recheck LDH early today.  Anticipated goals/duration of support: Bridge to recovery.   Steffanie Dunn, DO 03/17/20 8:08 AM [facility]

## 2020-03-17 NOTE — Progress Notes (Addendum)
ANTICOAGULATION CONSULT NOTE  Pharmacy Consult for Bivalirudin Indication: ECMO + DVTs  No Known Allergies  Patient Measurements: Height: 5\' 4"  (162.6 cm) Weight: 80.5 kg (177 lb 7.5 oz) IBW/kg (Calculated) : 59.2 Heparin Dosing Weight: 72kg  Vital Signs: Temp: 98.24 F (36.8 C) (01/05 0930) Temp Source: Bladder (01/05 0800) BP: 105/61 (01/05 0930) Pulse Rate: 92 (01/05 0930)  Labs: Recent Labs    03/15/20 0346 03/15/20 0349 03/16/20 0427 03/16/20 1120 03/16/20 1212 03/16/20 1636 03/16/20 1639 03/17/20 0349 03/17/20 0904 03/17/20 0908 03/17/20 0911  HGB 9.0*   < > 7.1*  7.1* 9.6*   < > 9.4*   < > 9.5*  9.6* 8.8* 8.8* 8.8*  HCT 27.3*   < > 22.4*  21.0* 30.1*   < > 27.8*   < > 28.0*  28.4* 26.0* 26.0* 26.0*  PLT 121*   < > 128* 141*  --  145*  --  149*  --   --   --   APTT 51*   < > 62*  --   --  58*  --  53*  --   --   --   LABPROT 18.8*  --  21.6*  --   --   --   --  19.9*  --   --   --   INR 1.6*  --  2.0*  --   --   --   --  1.8*  --   --   --   CREATININE 0.86   < > 0.94  --   --  0.86  --  0.84  --   --   --    < > = values in this interval not displayed.    Estimated Creatinine Clearance: 104.1 mL/min (by C-G formula based on SCr of 0.84 mg/dL).   Medical History: History reviewed. No pertinent past medical history.   Assessment: 89 yoM admitted with COVID-19 PNA with worsening hypoxia now s/p cannulation for ECMO. Pt was started on IV heparin prior to cannulation due to acute DVTs and possible PE, now to transition to bivalirudin.    aPTT 53sec this am despite increased drip rate of bivalirudin 0.25mg /kg/hr. CBC stable, no infusion issues per RN. LDH elevated this am and increased fibrin clot in circuit but no pump issues or alarms.    Goal of Therapy:  APTT 60-80 secs> plan for trach tomorrow - will need to decrease goal 50-60 sec for 2 days after trach  Monitor platelets by anticoagulation protocol: Yes   Plan:  Increase Bivalirudin to 0.3  mg/kg/hr Recheck aPTT, LDH and CBC with afternoon labs      PM Addendum  Bivalirudin increased 0.3mg /kg/hr with follow up aptt 69sec at goal  Then O2sat decreased to 50s, HR dropped 50-60s, bedside ECHO shows RV enlargement> concern for PE > bivalirudin drip stopped, and TPA 100mg  drip started continued for about 1/2 dose = 50mg  and O2 sat improved to 90s and slight nose bleed so TPA discontinued.    Plan recheck aptt in 1hr and resume bivalirudin when aptt <80sec  59 Pharm.D. CPP, BCPS Clinical Pharmacist [phone] 03/17/2020 4:39 PM

## 2020-03-17 NOTE — Progress Notes (Signed)
Assisted MD with intubation.  Patient intubated with 8.0 ETT secured at 22 cm at the lip.  Patient placed on PC 10, RR 10, Peep 10 per MD orders.  Patient tolerated well.

## 2020-03-17 NOTE — Plan of Care (Signed)
Updated son Sharia Reeve about need for reintubation for agitation that was difficult to control with retained ability to control his airway. Planning for tracheostomy tomorrow afternoon.  Steffanie Dunn, DO 03/17/20 9:22 AM [facility]

## 2020-03-17 NOTE — Procedures (Signed)
Intubation Procedure Note  Alex [name]  [id number]  [birth year]  Date:03/17/20  Time:9:11 AM   Provider Performing:Jakaila Norment P Chestine Spore    Procedure: Intubation (31500)  Indication(s) Respiratory Failure  Consent Unable to obtain consent due to emergent nature of procedure.   Anesthesia Etomidate 10mg  Rocuronium 80mg   Time Out Verified patient identification, verified procedure, site/side was marked, verified correct patient position, special equipment/implants available, medications/allergies/relevant history reviewed, required imaging and test results available.   Sterile Technique Usual hand hygeine, masks, and gloves were used   Procedure Description Patient positioned in bed supine.  Sedation given as noted above.  Patient was intubated with endotracheal tube using Glidescope.  View was Grade 1 full glottis .  Number of attempts was 1.  Colorimetric CO2 detector was consistent with tracheal placement.   Complications/Tolerance None; patient tolerated the procedure well. Chest X-ray is ordered to verify placement.   EBL Minimal   Specimen(s) None  , DO 03/17/20 9:13 AM [facility]

## 2020-03-17 NOTE — Progress Notes (Signed)
Patient ID: Alex [name], male   DOB: Oct 12, 1970, 50 y.o.   MRN: [id number]     Advanced Heart Failure Rounding Note  PCP-Cardiologist: No primary care provider on file.   Subjective:    - 12/30: VV ECMO cannulation - 12/31: Left chest tube replaced - 1/2: Extubated. Echo with EF 60-65%, mildly dilated RV with mildly decreased systolic function.  - 1/5: Agitated, suspected aspiration.  Re-intubated.   CXR looks worse today.  I/Os positive with weight up.  1 dose of Lasix yesterday.   Bivalirudin gtt PTT 53.   On vanomycin/cefepime.   ECMO parameters: 3000 rpm Flow 3.48 L/min Pvenous -46 Delta P 18 Sweep 3 ABG 7.43/45/70/94% LDH 410 => 543 => 728 => 1041 => 998 => 1305 Lactate 1.8 Post-oxygenator PaO2 445, pre 44.   Objective:   Weight Range: 80.5 kg Body mass index is 30.46 kg/m.   Vital Signs:   Temp:  [98.06 F (36.7 C)-98.78 F (37.1 C)] 98.24 F (36.8 C) (01/05 0930) Pulse Rate:  [80-116] 92 (01/05 0930) Resp:  [10-55] 10 (01/05 0930) BP: (105-189)/(57-107) 105/61 (01/05 0930) SpO2:  [70 %-100 %] 100 % (01/05 0930) Arterial Line BP: (122-312)/(56-115) 122/56 (01/05 0930) FiO2 (%):  [40 %-100 %] 40 % (01/05 0901) Weight:  [80.5 kg] 80.5 kg (01/05 0400) Last BM Date: 03/16/19  Weight change: Filed Weights   03/15/20 0530 03/16/20 0530 03/17/20 0400  Weight: 77.5 kg 79.9 kg 80.5 kg    Intake/Output:   Intake/Output Summary (Last 24 hours) at 03/17/2020 1011 Last data filed at 03/17/2020 0900 Gross per 24 hour  Intake 5334.59 ml  Output 3410 ml  Net 1924.59 ml      Physical Exam    General: Intubated, sedated Neck: JVP 8 cm, no thyromegaly or thyroid nodule.  Lungs: Decreased bilaterally.  CV: Nondisplaced PMI.  Heart regular S1/S2, no S3/S4, no murmur.  Trace ankle edema.  Abdomen: Soft, nontender, no hepatosplenomegaly, no distention.  Skin: Intact without lesions or rashes.  Neurologic: Sedated on vent.  Extremities: No clubbing or cyanosis.   HEENT: Normal.    Telemetry   NSR 80s (personally reviewed)  Labs    CBC Recent Labs    03/16/20 1636 03/16/20 1639 03/17/20 0349 03/17/20 0904 03/17/20 0911  WBC 17.7*  --  17.9*  --   --   HGB 9.4*   < > 9.5*  9.6* 8.8* 8.8*  HCT 27.8*   < > 28.0*  28.4* 26.0* 26.0*  MCV 87.1  --  87.4  --   --   PLT 145*  --  149*  --   --    < > = values in this interval not displayed.   Basic Metabolic Panel Recent Labs    51/88/41 1636 03/16/20 1639 03/17/20 0349 03/17/20 0904 03/17/20 0911  NA 141   < > 144  146* 146* 145  K 4.1   < > 3.8  3.9 4.2 4.1  CL 106  --  106  --   --   CO2 27  --  29  --   --   GLUCOSE 199*  --  156*  --   --   BUN 38*  --  37*  --   --   CREATININE 0.86  --  0.84  --   --   CALCIUM 7.3*  --  7.3*  --   --   MG  --   --  2.8*  --   --  PHOS  --   --  2.4*  --   --    < > = values in this interval not displayed.   Liver Function Tests Recent Labs    03/16/20 0427 03/17/20 0349  AST 54* 59*  ALT 42 43  ALKPHOS 72 123  BILITOT 0.9 1.5*  PROT 4.4* 4.7*  ALBUMIN 2.9* 2.9*   No results for input(s): LIPASE, AMYLASE in the last 72 hours. Cardiac Enzymes No results for input(s): CKTOTAL, CKMB, CKMBINDEX, TROPONINI in the last 72 hours.  BNP: BNP (last 3 results) No results for input(s): BNP in the last 8760 hours.  ProBNP (last 3 results) No results for input(s): PROBNP in the last 8760 hours.   D-Dimer No results for input(s): DDIMER in the last 72 hours. Hemoglobin A1C No results for input(s): HGBA1C in the last 72 hours. Fasting Lipid Panel Recent Labs    03/15/20 0346  TRIG 449*   Thyroid Function Tests No results for input(s): TSH, T4TOTAL, T3FREE, THYROIDAB in the last 72 hours.  Invalid input(s): FREET3  Other results:   Imaging    DG CHEST PORT 1 VIEW  Result Date: 03/17/2020 CLINICAL DATA:  Status post re-intubation EXAM: PORTABLE CHEST 1 VIEW COMPARISON:  Film from earlier in the same day. FINDINGS:  Feeding catheter, ECMO cannula and the left jugular central line are again seen and stable. New endotracheal tube is noted 3.2 cm above the carina in satisfactory position. Left chest tube is again noted without definitive pneumothorax. Diffuse airspace opacity is again identified bilaterally stable from the prior study. No bony abnormality is seen. IMPRESSION: Stable appearance of the chest with the exception of reintroduction of the endotracheal tube in satisfactory position Electronically Signed   By: Alcide Clever M.D.   On: 03/17/2020 09:36   DG CHEST PORT 1 VIEW  Result Date: 03/17/2020 CLINICAL DATA:  Respiratory failure, ECMO, COVID pneumonia EXAM: PORTABLE CHEST 1 VIEW COMPARISON:  03/16/2020 FINDINGS: Nasoenteric feeding tube extends into the upper abdomen beyond the margin of the examination. Left internal jugular central venous catheter tip noted within the superior vena cava. Right internal jugular ECMO cannula is seen with its proximal marker overlying the superior cavoatrial junction and its middle marker overlying the expected right atrium. Pulmonary insufflation is preserved. Superimposed extensive bilateral airspace infiltrate is stable with bibasilar air bronchograms again noted. Left chest tube is in place. No pneumothorax or pleural effusion. Cardiac size is within normal limits. IMPRESSION: Support lines and tubes as described above. Stable extensive diffuse airspace infiltrates. Left chest tube in place.  No pneumothorax. Electronically Signed   By: Helyn Numbers MD   On: 03/17/2020 06:39   DG Abd Portable 1V  Result Date: 03/17/2020 CLINICAL DATA:  Abdominal distension EXAM: PORTABLE ABDOMEN - 1 VIEW COMPARISON:  03/12/2020 FINDINGS: Scattered large and small bowel gas is noted. No free air is seen. No mass lesion is noted. Feeding catheter is seen and stable. ECMO cannula is noted and stable. IMPRESSION: No acute abnormality noted. Electronically Signed   By: Alcide Clever M.D.   On:  03/17/2020 09:37     Medications:     Scheduled Medications: . sodium chloride   Intravenous Once  . chlorhexidine  15 mL Mouth Rinse BID  . Chlorhexidine Gluconate Cloth  6 each Topical Daily  . clonazePAM  1 mg Per Tube Q8H  . docusate  100 mg Per Tube BID  . free water  200 mL Per Tube Q4H  . furosemide  20 mg Intravenous Daily  . guaiFENesin-dextromethorphan  10 mL Per Tube BID  . insulin detemir  35 Units Subcutaneous BID  . living well with diabetes book   Does not apply Once  . mouth rinse  15 mL Mouth Rinse q12n4p  . metoprolol tartrate  50 mg Per Tube BID  . midazolam      . oxyCODONE  10 mg Per Tube Q6H  . pantoprazole sodium  40 mg Per Tube QHS  . predniSONE  50 mg Per Tube Daily  . QUEtiapine  100 mg Per Tube BID  . sennosides  10 mL Per Tube Daily  . sodium chloride flush  10-40 mL Intracatheter Q12H  . sorbitol  30 mL Per Tube Once    Infusions: . sodium chloride    . sodium chloride 10 mL/hr at 03/17/20 0900  . sodium chloride Stopped (03/17/20 0830)  . sodium chloride Stopped (03/12/20 0131)  . albumin human 60 mL/hr at 03/16/20 2300  . bivalirudin (ANGIOMAX) infusion 0.5 mg/mL (Non-ACS indications) 0.3 mg/kg/hr (03/17/20 0900)  . ceFEPime (MAXIPIME) IV 200 mL/hr at 03/17/20 0900  . dexmedetomidine (PRECEDEX) IV infusion 0.6 mcg/kg/hr (03/17/20 0900)  . feeding supplement (PIVOT 1.5 CAL) Stopped (03/17/20 0800)  . fentaNYL infusion INTRAVENOUS    . insulin 13 mL/hr at 03/17/20 0900  . vancomycin Stopped (03/17/20 0533)    PRN Medications: Place/Maintain arterial line **AND** sodium chloride, sodium chloride, sodium chloride, acetaminophen (TYLENOL) oral liquid 160 mg/5 mL, acetaminophen, albumin human, chlorpheniramine-HYDROcodone, dextrose, fentaNYL (SUBLIMAZE) injection, guaiFENesin, haloperidol lactate, hydrALAZINE, labetalol, LORazepam, ondansetron (ZOFRAN) IV, oxyCODONE, sodium chloride flush   Assessment/Plan   1. Acute hypoxemic respiratory  failure: Due to COVID-19 PNA with bilateral infiltrates.  Refractory hypoxemia, VV-ECMO cannulation on 04/02/20 with improvement in oxygenation.  Developed left PTX post-subclavian CVL and has left chest tube, the left lung is re-expanded.  He was extubated 1/2 but reintubated today with agitation and suspected aspiration.  CXR looks worse.  He is now on vancomycin/cefepime for empiric abx coverage.  LDH higher today, but post-oxygenator PaO2 is still good and circuit functioning appropriately.  - ECMO circuit functioning appropriately but LDH rise concerning.  Will not change out yet.  - Tracheostomy tomorrow.  - Continue bivalirudin, goal PTT 60-80.  - Patient has had remdesivir, tocilizumab. - Ongoing steroids with prednisone.  - Continue vancomycin/cefepime.    - Needs diuresis, Lasix 40 mg IV bid today now that he is sedated on vent, will give albumin with Lasix.   2. RLE DVT/thrombus in RV/suspect PE: Echo with moderately dilated and moderately dysfunctional RV.  Clot noted on TEE in RV as well.  TTE 1/2 showed normal EF 60-65%, RV improved (mildly dilated/dysfunctional).  - Bivalirudin for goal PTT 60-80..  3. Left PTX: Left chest tube, lung is re-expanded.   4. Shock: Suspect septic/distributive.  Now resolved, off NE.  5. Anemia: Hgb 8.8, transfuse < 8.   6. AKI: Resolved.  7. Hyperglycemia: Insulin gtt today.  8. HTN: Following cuff pressure (whip in ABG). - Increase metoprolol to 50 mg bid.   CRITICAL CARE Performed by: Loralie Champagne  Total critical care time: 40 minutes  Critical care time was exclusive of separately billable procedures and treating other patients.  Critical care was necessary to treat or prevent imminent or life-threatening deterioration.  Critical care was time spent personally by me on the following activities: development of treatment plan with patient and/or surrogate as well as nursing, discussions with consultants, evaluation of patient's  response to  treatment, examination of patient, obtaining history from patient or surrogate, ordering and performing treatments and interventions, ordering and review of laboratory studies, ordering and review of radiographic studies, pulse oximetry and re-evaluation of patient's condition.    Length of Stay: 8  Marca Ancona, MD  03/17/2020, 10:11 AM  Advanced Heart Failure Team Pager [phone] (M-F; 7a - 4p)  Please contact [facility] for night-coverage after hours (4p -7a ) and weekends on [email or web address]

## 2020-03-17 NOTE — Procedures (Signed)
Arterial Catheter Insertion Procedure Note  Alex [name]  [id number]  1970-03-31  Date:03/17/20  Time:12:51 PM    Provider Performing: Ave Filter    Procedure: Insertion of Arterial Line (49201) without US guidance  Indication(s) Blood pressure monitoring and/or need for frequent ABGs  Consent Unable to obtain consent due to emergent nature of procedure.  Anesthesia None   Time Out Verified patient identification, verified procedure, site/side was marked, verified correct patient position, special equipment/implants available, medications/allergies/relevant history reviewed, required imaging and test results available.   Sterile Technique Maximal sterile technique including full sterile barrier drape, hand hygiene, sterile gown, sterile gloves, mask, hair covering, sterile ultrasound probe cover (if used).   Procedure Description Area of catheter insertion was cleaned with chlorhexidine and draped in sterile fashion. Without real-time ultrasound guidance an arterial catheter was placed into the right radial artery.  Appropriate arterial tracings confirmed on monitor.     Complications/Tolerance None; patient tolerated the procedure well.   EBL Minimal   Specimen(s) None

## 2020-03-17 NOTE — Progress Notes (Signed)
  Echocardiogram Echocardiogram Transesophageal has been performed.  [name] M 03/17/2020, 3:42 PM

## 2020-03-17 NOTE — Progress Notes (Signed)
Upper extremity arterial LT study completed.  Preliminary results relayed to [facility], DO.    See CV Proc for preliminary results report.   Jean Rosenthal, RDMS

## 2020-03-18 ENCOUNTER — Inpatient Hospital Stay (HOSPITAL_COMMUNITY): Payer: Medicaid Other

## 2020-03-18 DIAGNOSIS — Z9911 Dependence on respirator [ventilator] status: Secondary | ICD-10-CM

## 2020-03-18 LAB — BASIC METABOLIC PANEL
Anion gap: 8 (ref 5–15)
Anion gap: 9 (ref 5–15)
BUN: 38 mg/dL — ABNORMAL HIGH (ref 6–20)
BUN: 36 mg/dL — ABNORMAL HIGH (ref 6–20)
CO2: 27 mmol/L (ref 22–32)
CO2: 27 mmol/L (ref 22–32)
Calcium: 7.1 mg/dL — ABNORMAL LOW (ref 8.9–10.3)
Calcium: 7.3 mg/dL — ABNORMAL LOW (ref 8.9–10.3)
Chloride: 109 mmol/L (ref 98–111)
Chloride: 104 mmol/L (ref 98–111)
Creatinine, Ser: 0.89 mg/dL (ref 0.61–1.24)
Creatinine, Ser: 0.95 mg/dL (ref 0.61–1.24)
GFR, Estimated: >60 mL/min (ref >60)
GFR, Estimated: >60 mL/min (ref >60)
Glucose, Bld: 103 mg/dL — ABNORMAL HIGH (ref 70–99)
Glucose, Bld: 155 mg/dL — ABNORMAL HIGH (ref 70–99)
Potassium: 5 mmol/L (ref 3.5–5.1)
Potassium: 5.2 mmol/L — ABNORMAL HIGH (ref 3.5–5.1)
Sodium: 144 mmol/L (ref 135–145)
Sodium: 140 mmol/L (ref 135–145)

## 2020-03-18 LAB — CULTURE, BLOOD (ROUTINE X 2): Special Requests: ADEQUATE

## 2020-03-18 LAB — POCT I-STAT 7, (LYTES, BLD GAS, ICA,H+H)
Acid-Base Excess: 2 mmol/L (ref 0.0–2.0)
Acid-Base Excess: 4 mmol/L — ABNORMAL HIGH (ref 0.0–2.0)
Acid-Base Excess: 4 mmol/L — ABNORMAL HIGH (ref 0.0–2.0)
Acid-Base Excess: 5 mmol/L — ABNORMAL HIGH (ref 0.0–2.0)
Acid-Base Excess: 5 mmol/L — ABNORMAL HIGH (ref 0.0–2.0)
Bicarbonate: 29.1 mmol/L — ABNORMAL HIGH (ref 20.0–28.0)
Bicarbonate: 29.4 mmol/L — ABNORMAL HIGH (ref 20.0–28.0)
Bicarbonate: 29.4 mmol/L — ABNORMAL HIGH (ref 20.0–28.0)
Bicarbonate: 30 mmol/L — ABNORMAL HIGH (ref 20.0–28.0)
Bicarbonate: 30.4 mmol/L — ABNORMAL HIGH (ref 20.0–28.0)
Calcium, Ion: 1.05 mmol/L — ABNORMAL LOW (ref 1.15–1.40)
Calcium, Ion: 1.07 mmol/L — ABNORMAL LOW (ref 1.15–1.40)
Calcium, Ion: 1.08 mmol/L — ABNORMAL LOW (ref 1.15–1.40)
Calcium, Ion: 1.09 mmol/L — ABNORMAL LOW (ref 1.15–1.40)
Calcium, Ion: 1.11 mmol/L — ABNORMAL LOW (ref 1.15–1.40)
HCT: 20 % — ABNORMAL LOW (ref 39.0–52.0)
HCT: 22 % — ABNORMAL LOW (ref 39.0–52.0)
HCT: 23 % — ABNORMAL LOW (ref 39.0–52.0)
HCT: 28 % — ABNORMAL LOW (ref 39.0–52.0)
HCT: 31 % — ABNORMAL LOW (ref 39.0–52.0)
Hemoglobin: 10.5 g/dL — ABNORMAL LOW (ref 13.0–17.0)
Hemoglobin: 6.8 g/dL — CL (ref 13.0–17.0)
Hemoglobin: 7.5 g/dL — ABNORMAL LOW (ref 13.0–17.0)
Hemoglobin: 7.8 g/dL — ABNORMAL LOW (ref 13.0–17.0)
Hemoglobin: 9.5 g/dL — ABNORMAL LOW (ref 13.0–17.0)
O2 Saturation: 100 %
O2 Saturation: 100 %
O2 Saturation: 100 %
O2 Saturation: 98 %
O2 Saturation: 98 %
Patient temperature: 36.6
Patient temperature: 36.7
Patient temperature: 36.7
Patient temperature: 37
Potassium: 4.2 mmol/L (ref 3.5–5.1)
Potassium: 4.3 mmol/L (ref 3.5–5.1)
Potassium: 4.8 mmol/L (ref 3.5–5.1)
Potassium: 5.3 mmol/L — ABNORMAL HIGH (ref 3.5–5.1)
Potassium: 5.6 mmol/L — ABNORMAL HIGH (ref 3.5–5.1)
Sodium: 140 mmol/L (ref 135–145)
Sodium: 140 mmol/L (ref 135–145)
Sodium: 144 mmol/L (ref 135–145)
Sodium: 144 mmol/L (ref 135–145)
Sodium: 144 mmol/L (ref 135–145)
TCO2: 31 mmol/L (ref 22–32)
TCO2: 31 mmol/L (ref 22–32)
TCO2: 31 mmol/L (ref 22–32)
TCO2: 32 mmol/L (ref 22–32)
TCO2: 32 mmol/L (ref 22–32)
pCO2 arterial: 44.1 mmHg (ref 32.0–48.0)
pCO2 arterial: 48.1 mmHg — ABNORMAL HIGH (ref 32.0–48.0)
pCO2 arterial: 48.5 mmHg — ABNORMAL HIGH (ref 32.0–48.0)
pCO2 arterial: 52.7 mmHg — ABNORMAL HIGH (ref 32.0–48.0)
pCO2 arterial: 60 mmHg — ABNORMAL HIGH (ref 32.0–48.0)
pH, Arterial: 7.294 — ABNORMAL LOW (ref 7.350–7.450)
pH, Arterial: 7.368 (ref 7.350–7.450)
pH, Arterial: 7.391 (ref 7.350–7.450)
pH, Arterial: 7.401 (ref 7.350–7.450)
pH, Arterial: 7.431 (ref 7.350–7.450)
pO2, Arterial: 113 mmHg — ABNORMAL HIGH (ref 83.0–108.0)
pO2, Arterial: 124 mmHg — ABNORMAL HIGH (ref 83.0–108.0)
pO2, Arterial: 184 mmHg — ABNORMAL HIGH (ref 83.0–108.0)
pO2, Arterial: 250 mmHg — ABNORMAL HIGH (ref 83.0–108.0)
pO2, Arterial: 274 mmHg — ABNORMAL HIGH (ref 83.0–108.0)

## 2020-03-18 LAB — CBC
HCT: 25.1 % — ABNORMAL LOW (ref 39.0–52.0)
HCT: 32.4 % — ABNORMAL LOW (ref 39.0–52.0)
Hemoglobin: 7.8 g/dL — ABNORMAL LOW (ref 13.0–17.0)
Hemoglobin: 10.4 g/dL — ABNORMAL LOW (ref 13.0–17.0)
MCH: 28.1 pg (ref 26.0–34.0)
MCH: 29.7 pg (ref 26.0–34.0)
MCHC: 31.1 g/dL (ref 30.0–36.0)
MCHC: 32.1 g/dL (ref 30.0–36.0)
MCV: 90.3 fL (ref 80.0–100.0)
MCV: 92.6 fL (ref 80.0–100.0)
Platelets: 127 10*3/uL — ABNORMAL LOW (ref 150–400)
Platelets: 132 10*3/uL — ABNORMAL LOW (ref 150–400)
RBC: 2.78 MIL/uL — ABNORMAL LOW (ref 4.22–5.81)
RBC: 3.5 MIL/uL — ABNORMAL LOW (ref 4.22–5.81)
RDW: 18.2 % — ABNORMAL HIGH (ref 11.5–15.5)
RDW: 18 % — ABNORMAL HIGH (ref 11.5–15.5)
WBC: 13.6 10*3/uL — ABNORMAL HIGH (ref 4.0–10.5)
WBC: 21.4 10*3/uL — ABNORMAL HIGH (ref 4.0–10.5)
nRBC: 1 % — ABNORMAL HIGH (ref 0.0–0.2)
nRBC: 0.8 % — ABNORMAL HIGH (ref 0.0–0.2)

## 2020-03-18 LAB — GLUCOSE, CAPILLARY
Glucose-Capillary: 102 mg/dL — ABNORMAL HIGH (ref 70–99)
Glucose-Capillary: 102 mg/dL — ABNORMAL HIGH (ref 70–99)
Glucose-Capillary: 125 mg/dL — ABNORMAL HIGH (ref 70–99)
Glucose-Capillary: 134 mg/dL — ABNORMAL HIGH (ref 70–99)
Glucose-Capillary: 140 mg/dL — ABNORMAL HIGH (ref 70–99)
Glucose-Capillary: 146 mg/dL — ABNORMAL HIGH (ref 70–99)
Glucose-Capillary: 165 mg/dL — ABNORMAL HIGH (ref 70–99)
Glucose-Capillary: 219 mg/dL — ABNORMAL HIGH (ref 70–99)
Glucose-Capillary: 251 mg/dL — ABNORMAL HIGH (ref 70–99)
Glucose-Capillary: 259 mg/dL — ABNORMAL HIGH (ref 70–99)
Glucose-Capillary: 278 mg/dL — ABNORMAL HIGH (ref 70–99)
Glucose-Capillary: 61 mg/dL — ABNORMAL LOW (ref 70–99)
Glucose-Capillary: 65 mg/dL — ABNORMAL LOW (ref 70–99)
Glucose-Capillary: 66 mg/dL — ABNORMAL LOW (ref 70–99)
Glucose-Capillary: 77 mg/dL (ref 70–99)
Glucose-Capillary: 85 mg/dL (ref 70–99)
Glucose-Capillary: 88 mg/dL (ref 70–99)
Glucose-Capillary: 97 mg/dL (ref 70–99)
Glucose-Capillary: 99 mg/dL (ref 70–99)

## 2020-03-18 LAB — PROTIME-INR
INR: 2 — ABNORMAL HIGH (ref 0.8–1.2)
Prothrombin Time: 22.3 seconds — ABNORMAL HIGH (ref 11.4–15.2)

## 2020-03-18 LAB — HEPATIC FUNCTION PANEL
ALT: 40 U/L (ref 0–44)
AST: 44 U/L — ABNORMAL HIGH (ref 15–41)
Albumin: 2.6 g/dL — ABNORMAL LOW (ref 3.5–5.0)
Alkaline Phosphatase: 82 U/L (ref 38–126)
Bilirubin, Direct: 0.2 mg/dL (ref 0.0–0.2)
Indirect Bilirubin: 1.1 mg/dL — ABNORMAL HIGH (ref 0.3–0.9)
Total Bilirubin: 1.3 mg/dL — ABNORMAL HIGH (ref 0.3–1.2)
Total Protein: 4.2 g/dL — ABNORMAL LOW (ref 6.5–8.1)

## 2020-03-18 LAB — LACTATE DEHYDROGENASE: LDH: 934 U/L — ABNORMAL HIGH (ref 98–192)

## 2020-03-18 LAB — VANCOMYCIN, TROUGH
Vancomycin Tr: 30 ug/mL (ref 15–20)
Vancomycin Tr: 13 ug/mL — ABNORMAL LOW (ref 15–20)

## 2020-03-18 LAB — FIBRINOGEN: Fibrinogen: 231 mg/dL (ref 210–475)

## 2020-03-18 LAB — APTT
aPTT: 63 seconds — ABNORMAL HIGH (ref 24–36)
aPTT: 29 seconds (ref 24–36)
aPTT: 58 seconds — ABNORMAL HIGH (ref 24–36)

## 2020-03-18 LAB — LACTIC ACID, PLASMA
Lactic Acid, Venous: 2.1 mmol/L (ref 0.5–1.9)
Lactic Acid, Venous: 2.1 mmol/L (ref 0.5–1.9)

## 2020-03-18 MED ORDER — DEXTROSE 50 % IV SOLN
INTRAVENOUS | Status: AC
  Filled 2020-03-18: qty 50

## 2020-03-18 MED ORDER — ACETAMINOPHEN 500 MG PO TABS
1000.0000 mg | ORAL_TABLET | Freq: Three times a day (TID) | ORAL | Status: AC
  Administered 2020-03-18 – 2020-03-19 (×3): 1000 mg via ORAL
  Filled 2020-03-18 (×3): qty 2

## 2020-03-18 MED ORDER — SODIUM CHLORIDE 0.9 % IV SOLN
0.1600 mg/kg/h | INTRAVENOUS | Status: DC
  Administered 2020-03-18 – 2020-03-19 (×2): 0.23 mg/kg/h via INTRAVENOUS
  Administered 2020-03-20: 0.25 mg/kg/h via INTRAVENOUS
  Administered 2020-03-20: 0.23 mg/kg/h via INTRAVENOUS
  Administered 2020-03-21 (×2): 0.25 mg/kg/h via INTRAVENOUS
  Administered 2020-03-22: 0.28 mg/kg/h via INTRAVENOUS
  Administered 2020-03-22 – 2020-03-23 (×3): 0.27 mg/kg/h via INTRAVENOUS
  Administered 2020-03-24: 0.25 mg/kg/h via INTRAVENOUS
  Administered 2020-03-25: 02:00:00 0.22 mg/kg/h via INTRAVENOUS
  Administered 2020-03-25 – 2020-03-26 (×2): 0.19 mg/kg/h via INTRAVENOUS
  Administered 2020-03-27 – 2020-03-30 (×5): 0.17 mg/kg/h via INTRAVENOUS
  Administered 2020-03-31: 0.16 mg/kg/h via INTRAVENOUS
  Filled 2020-03-18 (×25): qty 250

## 2020-03-18 MED ORDER — PROPOFOL 10 MG/ML IV BOLUS: 500.0000 mg | Freq: Once | INTRAVENOUS | Status: DC

## 2020-03-18 MED ORDER — VECURONIUM BROMIDE 10 MG IV SOLR
10.0000 mg | Freq: Once | INTRAVENOUS | Status: AC
  Administered 2020-03-18: 10 mg via INTRAVENOUS

## 2020-03-18 MED ORDER — VECURONIUM BROMIDE 10 MG IV SOLR
INTRAVENOUS | Status: AC
  Filled 2020-03-18: qty 10

## 2020-03-18 MED ORDER — VANCOMYCIN HCL IN DEXTROSE 1-5 GM/200ML-% IV SOLN
1000.0000 mg | Freq: Two times a day (BID) | INTRAVENOUS | Status: DC
  Administered 2020-03-18 – 2020-03-22 (×8): 1000 mg via INTRAVENOUS
  Filled 2020-03-18 (×8): qty 200

## 2020-03-18 MED ORDER — PROPOFOL 500 MG/50ML IV EMUL
INTRAVENOUS | Status: AC
  Filled 2020-03-18: qty 50

## 2020-03-18 MED ORDER — FENTANYL CITRATE (PF) 100 MCG/2ML IJ SOLN
200.0000 ug | Freq: Once | INTRAMUSCULAR | Status: AC
  Administered 2020-03-18: 200 ug via INTRAVENOUS

## 2020-03-18 MED ORDER — SODIUM CHLORIDE 0.9% IV SOLUTION: Freq: Once | INTRAVENOUS | Status: AC

## 2020-03-18 MED ORDER — MIDAZOLAM HCL 2 MG/2ML IJ SOLN
5.0000 mg | Freq: Once | INTRAMUSCULAR | Status: DC
  Filled 2020-03-18: qty 6

## 2020-03-18 MED ORDER — VANCOMYCIN VARIABLE DOSE PER UNSTABLE RENAL FUNCTION (PHARMACIST DOSING): Status: DC

## 2020-03-18 MED ORDER — ETOMIDATE 2 MG/ML IV SOLN
INTRAVENOUS | Status: AC
  Administered 2020-03-18: 40 mg
  Filled 2020-03-18: qty 20

## 2020-03-18 MED ORDER — STERILE WATER FOR INJECTION IJ SOLN
INTRAMUSCULAR | Status: AC
  Filled 2020-03-18: qty 10

## 2020-03-18 NOTE — Progress Notes (Signed)
Pharmacy Antibiotic Note  Alex [name] Alex [name] is a 50 y.o. male admitted on 04/05/20 with sepsis.  Pharmacy has been consulted for vancomycin and cefepime dosing.  Vancomycin levels demonstrate therapeutic AUC of 513 with vancomycin trough of 13 mcg/ml.   Plan: Cefepime 2g IV every 8 hours  Continue vancomycin 1000mg  IV q12h  Height: 5\' 4"  (162.6 cm) Weight: 81.6 kg (179 lb 14.3 oz) IBW/kg (Calculated) : 59.2  Temp (24hrs), Avg:98 F (36.7 C), Min:97.52 F (36.4 C), Max:98.42 F (36.9 C)  Recent Labs  Lab 03/16/20 0427 03/16/20 1120 03/16/20 1636 03/17/20 0349 03/17/20 1224 03/17/20 1621 03/18/20 0412 03/18/20 0609 03/18/20 1538  WBC 12.3*   < > 17.7* 17.9* 15.3* 21.3* 13.6*  --   --   CREATININE 0.94  --  0.86 0.84  --  1.29* 0.89  --   --   LATICACIDVEN 1.9  --  1.9 1.8  --  2.7* 2.1*  --   --   VANCOTROUGH  --   --   --   --   --   --   --  30* 13*   < > = values in this interval not displayed.    Estimated Creatinine Clearance: 99 mL/min (by C-G formula based on SCr of 0.89 mg/dL).    No Known Allergies  Antimicrobials this admission: Ceftriaxone 12/28 >> 1/1 Azithromycin 12/28 >> 1/1 Vancomycin 1/3 >> Cefepime 1/3 >>   Microbiology results: 1/3 BCx: sent 1/3 UCx: sent   1/3 Sputum: sent  12/28 MRSA PCR: neg   Thank you for allowing pharmacy to be a part of this patient's care.   1/29, PharmD, BCPS, [facility] Clinical Pharmacist [phone] Please check AMION for all [facility] numbers 03/18/2020

## 2020-03-18 NOTE — Progress Notes (Signed)
Hypoglycemic Event  CBG: 61  Treatment: 50% Dextrose 30 mL IV per [employer] instructions  Symptoms: None  Follow-up CBG: Time: 1045 CBG Result: 134  Possible Reasons for Event: TF on hold  Comments/MD notified:MD aware of hypoglycemic events while off tube feeds    [place], [address]

## 2020-03-18 NOTE — Progress Notes (Signed)
Patient ID: Alex [name], male   DOB: 1971-03-13, 50 y.o.   MRN: [id number]     Advanced Heart Failure Rounding Note  PCP-Cardiologist: No primary care provider on file.   Subjective:    - 12/30: VV ECMO cannulation - 12/31: Left chest tube replaced - 1/2: Extubated. Echo with EF 60-65%, mildly dilated RV with mildly decreased systolic function.  - 1/4: Agitated, suspected aspiration.  Re-intubated.  - 1/5: ECMO cannula repositioned under TEE guidance. TEE showed moderately dilated/moderate-severely dysfunctional RV in setting of hypoxemia. LUE DVT found.  Patient got 1/2 dose of TPA due to initial concern for large PE.   I/Os positive with weight up.  1 dose of Lasix yesterday.   Off pressors.  He is now back in NSR after CHB episode yesterday. Off metoprolol now.   Bivalirudin gtt PTT 63.   On vanomycin/cefepime.   ECMO parameters: 3400 rpm Flow 4.81 L/min Pvenous -80 Delta P 23 Sweep 4 ABG 7.37/53/274/100% LDH 410 => 543 => 728 => 1041 => 998 => 1305 => 934 Lactate 2.1  Objective:   Weight Range: 81.6 kg Body mass index is 30.88 kg/m.   Vital Signs:   Temp:  [97.52 F (36.4 C)-98.78 F (37.1 C)] 98.24 F (36.8 C) (01/06 0600) Pulse Rate:  [50-106] 85 (01/06 0600) Resp:  [0-32] 8 (01/06 0600) BP: (97-196)/(52-102) 134/81 (01/06 0600) SpO2:  [59 %-100 %] 100 % (01/06 0600) Arterial Line BP: (87-342)/(36-121) 159/72 (01/06 0600) FiO2 (%):  [40 %-100 %] 100 % (01/06 0408) Weight:  [81.6 kg] 81.6 kg (01/06 0500) Last BM Date: 03/17/20  Weight change: Filed Weights   03/16/20 0530 03/17/20 0400 03/18/20 0500  Weight: 79.9 kg 80.5 kg 81.6 kg    Intake/Output:   Intake/Output Summary (Last 24 hours) at 03/18/2020 0741 Last data filed at 03/18/2020 0600 Gross per 24 hour  Intake 3787.06 ml  Output 2190 ml  Net 1597.06 ml      Physical Exam    General: NAD, intubated Neck: JVP 8 cm, no thyromegaly or thyroid nodule.  Lungs: Clear to auscultation  bilaterally with normal respiratory effort. CV: Nondisplaced PMI.  Heart regular S1/S2, no S3/S4, no murmur.  No peripheral edema.   Abdomen: Soft, nontender, no hepatosplenomegaly, no distention.  Skin: Intact without lesions or rashes.  Neurologic: Will wake up/follow commands. Extremities: No clubbing or cyanosis.  HEENT: Normal.    Telemetry   NSR 70s (personally reviewed)  Labs    CBC Recent Labs    03/17/20 1621 03/17/20 2039 03/18/20 0412 03/18/20 0414  WBC 21.3*  --  13.6*  --   HGB 8.6*   < > 7.8* 7.5*  HCT 25.6*   < > 25.1* 22.0*  MCV 88.6  --  90.3  --   PLT 140*  --  127*  --    < > = values in this interval not displayed.   Basic Metabolic Panel Recent Labs    03/17/20 0349 03/17/20 0904 03/17/20 1621 03/17/20 2039 03/18/20 0412 03/18/20 0414  NA 144  146*   < > 140   < > 144 144  K 3.8  3.9   < > 4.2   < > 5.0 4.8  CL 106  --  109  --  109  --   CO2 29  --  21*  --  27  --   GLUCOSE 156*  --  234*  --  103*  --   BUN 37*  --  49*  --  38*  --   CREATININE 0.84  --  1.29*  --  0.89  --   CALCIUM 7.3*  --  6.9*  --  7.1*  --   MG 2.8*  --   --   --   --   --   PHOS 2.4*  --   --   --   --   --    < > = values in this interval not displayed.   Liver Function Tests Recent Labs    03/17/20 1224 03/18/20 0412  AST 54* 44*  ALT 40 40  ALKPHOS 114 82  BILITOT 1.5* 1.3*  PROT 4.6* 4.2*  ALBUMIN 3.0* 2.6*   No results for input(s): LIPASE, AMYLASE in the last 72 hours. Cardiac Enzymes No results for input(s): CKTOTAL, CKMB, CKMBINDEX, TROPONINI in the last 72 hours.  BNP: BNP (last 3 results) No results for input(s): BNP in the last 8760 hours.  ProBNP (last 3 results) No results for input(s): PROBNP in the last 8760 hours.   D-Dimer No results for input(s): DDIMER in the last 72 hours. Hemoglobin A1C No results for input(s): HGBA1C in the last 72 hours. Fasting Lipid Panel No results for input(s): CHOL, HDL, LDLCALC, TRIG, CHOLHDL,  LDLDIRECT in the last 72 hours. Thyroid Function Tests No results for input(s): TSH, T4TOTAL, T3FREE, THYROIDAB in the last 72 hours.  Invalid input(s): FREET3  Other results:   Imaging    DG CHEST PORT 1 VIEW  Result Date: 03/18/2020 CLINICAL DATA:  ECMO.  COVID positive EXAM: PORTABLE CHEST 1 VIEW COMPARISON:  Yesterday FINDINGS: ECMO catheter from right IJ approach in stable position. Left-sided central line with tip at the SVC level. Endotracheal tube tip is just below the clavicular heads. Left chest tube in place with tip at the apex. The enteric tube reaches the stomach. Some increase in lung aeration. No visible air leak. Largely obscured heart size. IMPRESSION: 1. Stable hardware positioning. 2. Interval aeration seen in the upper lungs. Electronically Signed   By: Monte Fantasia M.D.   On: 03/18/2020 06:22   DG CHEST PORT 1 VIEW  Result Date: 03/17/2020 CLINICAL DATA:  ECMO EXAM: PORTABLE CHEST 1 VIEW COMPARISON:  Portable exam 1556 hours compared to 0921 hours FINDINGS: Tip of endotracheal tube projects 3.7 cm above carina. Feeding tube extends into stomach. External pacing leads project over chest. LEFT thoracostomy tube stable. LEFT jugular central venous catheter with tip projecting over SVC. ECMO catheter projects over RIGHT atrium. Severe diffuse BILATERAL pulmonary consolidation, increased. No pneumothorax or acute osseous findings. IMPRESSION: Severe diffuse BILATERAL pulmonary consolidation, increased from previous study. Electronically Signed   By: Lavonia Dana M.D.   On: 03/17/2020 16:13   DG CHEST PORT 1 VIEW  Result Date: 03/17/2020 CLINICAL DATA:  Status post re-intubation EXAM: PORTABLE CHEST 1 VIEW COMPARISON:  Film from earlier in the same day. FINDINGS: Feeding catheter, ECMO cannula and the left jugular central line are again seen and stable. New endotracheal tube is noted 3.2 cm above the carina in satisfactory position. Left chest tube is again noted without  definitive pneumothorax. Diffuse airspace opacity is again identified bilaterally stable from the prior study. No bony abnormality is seen. IMPRESSION: Stable appearance of the chest with the exception of reintroduction of the endotracheal tube in satisfactory position Electronically Signed   By: Inez Catalina M.D.   On: 03/17/2020 09:36   DG Abd Portable 1V  Result Date: 03/17/2020 CLINICAL DATA:  Abdominal distension EXAM: PORTABLE  ABDOMEN - 1 VIEW COMPARISON:  03/12/2020 FINDINGS: Scattered large and small bowel gas is noted. No free air is seen. No mass lesion is noted. Feeding catheter is seen and stable. ECMO cannula is noted and stable. IMPRESSION: No acute abnormality noted. Electronically Signed   By: Inez Catalina M.D.   On: 03/17/2020 09:37   VAS [place] UPPER EXTREMITY ARTERIAL DUPLEX  Result Date: 03/17/2020 UPPER EXTREMITY DUPLEX STUDY Indications: Cold, discolored left hand. History:     Patient has a history of COVID-19 infection, on vasopressors.  Limitations: Bandaging Comparison Study: No prior studies. Performing Technologist: Darlin Coco RDMS  Examination Guidelines: A complete evaluation includes B-mode imaging, spectral Doppler, color Doppler, and power Doppler as needed of all accessible portions of each vessel. Bilateral testing is considered an integral part of a complete examination. Limited examinations for reoccurring indications may be performed as noted.  Left Doppler Findings: +--------------+----------+---------+-------------+----------------------------+ Site          PSV (cm/s)Waveform Stenosis     Comments                     +--------------+----------+---------+-------------+----------------------------+ Subclavian Mid88        triphasic             Diffcult to visualize due to                                               bandaging.                   +--------------+----------+---------+-------------+----------------------------+ Axillary      129        triphasic                                          +--------------+----------+---------+-------------+----------------------------+ Brachial Prox 237       triphasic>50% stenosis                             +--------------+----------+---------+-------------+----------------------------+ Brachial Mid  192       triphasic                                          +--------------+----------+---------+-------------+----------------------------+ Brachial Dist 138       triphasic                                          +--------------+----------+---------+-------------+----------------------------+ Radial Prox   92        triphasic                                          +--------------+----------+---------+-------------+----------------------------+ Radial Mid    85        triphasic                                          +--------------+----------+---------+-------------+----------------------------+  Radial Dist   88        triphasic                                          +--------------+----------+---------+-------------+----------------------------+ Ulnar Prox    78        triphasic                                          +--------------+----------+---------+-------------+----------------------------+ Ulnar Mid     44        triphasic                                          +--------------+----------+---------+-------------+----------------------------+ Ulnar Dist    67        triphasic                                          +--------------+----------+---------+-------------+----------------------------+ Palmar Arch   46        triphasic                                          +--------------+----------+---------+-------------+----------------------------+   Summary:  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        Incidental: Left brachial vein acute thrombus. *See table(s) above for measurements and observations.     Preliminary    ECHOCARDIOGRAM LIMITED  Result Date: 03/17/2020    ECHOCARDIOGRAM LIMITED REPORT   Patient Name:   DANDRAE KUSTRA Date of Exam: 03/17/2020 Medical Rec #:  [id number]      Height:       64.0 in Accession #:    [id number]     Weight:       177.5 lb Date of Birth:  1970-04-16      BSA:          1.859 m Patient Age:    75 years       BP:           140/62 mmHg Patient Gender: M              HR:           59 bpm. Exam Location:  Inpatient Procedure: Limited Color Doppler, Cardiac Doppler and Limited Echo Indications:    Acutre respiratory distress R06.03  History:        Patient has prior history of Echocardiogram examinations, most                 recent 03/16/2020. COVID 19. Shock. Anemia. Acute kidney injury.                 hypoxemia.  Sonographer:    Darlina Sicilian RDCS Referring Phys: [place] Comments: Oxygen drop in saturation, hypotension, and change in EKG. Look at [facility] cannula location. IMPRESSIONS  1. Left ventricular ejection fraction, by estimation, is >75%. The left ventricle has hyperdynamic function. The left ventricle has no regional wall motion abnormalities. There is the  interventricular septum is flattened in systole and diastole, consistent with right ventricular pressure and volume overload.  2. Right ventricular systolic function is severely reduced. The right ventricular size is moderately enlarged. There is severely elevated pulmonary artery systolic pressure. The estimated right ventricular systolic pressure is 15.0 mmHg.  3. The pericardial effusion is anterior to the right ventricle.  4. The mitral valve is normal in structure. No evidence of mitral valve regurgitation. No evidence of mitral stenosis.  5. Tricuspid valve regurgitation is mild to moderate.  6. The aortic valve is normal in structure. Aortic valve regurgitation is not visualized. No aortic stenosis is present.  7. ECMO catheter noted in IVC but poorly visualized. The inferior vena cava is  normal in size with greater than 50% respiratory variability, suggesting right atrial pressure of 3 mmHg. FINDINGS  Left Ventricle: Left ventricular ejection fraction, by estimation, is >75%. The left ventricle has hyperdynamic function. The left ventricle has no regional wall motion abnormalities. The left ventricular internal cavity size was normal in size. There is no left ventricular hypertrophy. The interventricular septum is flattened in systole and diastole, consistent with right ventricular pressure and volume overload. Right Ventricle: The right ventricular size is moderately enlarged. No increase in right ventricular wall thickness. Right ventricular systolic function is severely reduced. There is severely elevated pulmonary artery systolic pressure. The tricuspid regurgitant velocity is 3.37 m/s, and with an assumed right atrial pressure of 15 mmHg, the estimated right ventricular systolic pressure is 56.9 mmHg. Left Atrium: Left atrial size was normal in size. Right Atrium: Right atrial size was normal in size. Pericardium: Trivial pericardial effusion is present. The pericardial effusion is anterior to the right ventricle. Mitral Valve: The mitral valve is normal in structure. No evidence of mitral valve stenosis. Tricuspid Valve: The tricuspid valve is normal in structure. Tricuspid valve regurgitation is mild to moderate. No evidence of tricuspid stenosis. Aortic Valve: The aortic valve is normal in structure. Aortic valve regurgitation is not visualized. No aortic stenosis is present. Pulmonic Valve: The pulmonic valve was normal in structure. Pulmonic valve regurgitation is not visualized. No evidence of pulmonic stenosis. Aorta: The aortic root is normal in size and structure. Venous: ECMO catheter noted in IVC but poorly visualized. The inferior vena cava is normal in size with greater than 50% respiratory variability, suggesting right atrial pressure of 3 mmHg. IAS/Shunts: No atrial level shunt  detected by color flow Doppler. TRICUSPID VALVE TR Peak grad:   45.4 mmHg TR Vmax:        337.00 cm/s Fransico Him MD Electronically signed by Fransico Him MD Signature Date/Time: 03/17/2020/5:31:25 PM    Final      Medications:     Scheduled Medications: . sodium chloride   Intravenous Once  . chlorhexidine gluconate (MEDLINE KIT)  15 mL Mouth Rinse BID  . Chlorhexidine Gluconate Cloth  6 each Topical Daily  . clonazePAM  1 mg Per Tube Q8H  . docusate  100 mg Per Tube BID  . free water  200 mL Per Tube Q4H  . furosemide  40 mg Intravenous BID  . guaiFENesin-dextromethorphan  10 mL Per Tube BID  . insulin detemir  35 Units Subcutaneous BID  . living well with diabetes book   Does not apply Once  . mouth rinse  15 mL Mouth Rinse 10 times per day  . oxyCODONE  10 mg Per Tube Q6H  . pantoprazole sodium  40 mg Per Tube QHS  . predniSONE  50  mg Per Tube Daily  . QUEtiapine  100 mg Per Tube BID  . sennosides  10 mL Per Tube Daily  . sodium chloride flush  10-40 mL Intracatheter Q12H  . vancomycin variable dose per unstable renal function (pharmacist dosing)   Does not apply See admin instructions    Infusions: . sodium chloride    . sodium chloride 10 mL/hr at 03/18/20 0600  . sodium chloride 10 mL/hr at 03/18/20 0600  . sodium chloride Stopped (03/12/20 0131)  . sodium chloride    . albumin human 12.5 g (03/17/20 1143)  . bivalirudin (ANGIOMAX) infusion 0.5 mg/mL (Non-ACS indications) 0.25 mg/kg/hr (03/18/20 0600)  . ceFEPime (MAXIPIME) IV Stopped (03/18/20 0122)  . dexmedetomidine (PRECEDEX) IV infusion 0.7 mcg/kg/hr (03/18/20 0701)  . feeding supplement (PIVOT 1.5 CAL) Stopped (03/17/20 1400)  . fentaNYL infusion INTRAVENOUS 200 mcg/hr (03/18/20 0600)  . insulin 0.6 mL/hr at 03/18/20 0600  . norepinephrine (LEVOPHED) Adult infusion Stopped (03/17/20 2127)    PRN Medications: Place/Maintain arterial line **AND** sodium chloride, sodium chloride, sodium chloride, acetaminophen  (TYLENOL) oral liquid 160 mg/5 mL, acetaminophen, albumin human, chlorpheniramine-HYDROcodone, dextrose, fentaNYL (SUBLIMAZE) injection, guaiFENesin, haloperidol lactate, hydrALAZINE, labetalol, LORazepam, ondansetron (ZOFRAN) IV, oxyCODONE, sodium chloride flush   Assessment/Plan   1. Acute hypoxemic respiratory failure: Due to COVID-19 PNA with bilateral infiltrates.  Refractory hypoxemia, VV-ECMO cannulation on 02/18/2020 with improvement in oxygenation.  Developed left PTX post-subclavian CVL and has left chest tube, the left lung is re-expanded.  He was extubated 1/2 but reintubated 1/4 with agitation and suspected aspiration.  CXR with bilateral infiltrates.  He is now on vancomycin/cefepime for empiric abx coverage.  ECMO cannula repositioned 1/5, excellent ABG today.  LDH lower today.  - ECMO circuit functioning appropriately.  - Tracheostomy today.   - Continue bivalirudin, goal PTT 60-80.  - Patient has had remdesivir, tocilizumab. - Ongoing steroids with prednisone.  - Continue vancomycin/cefepime.    - Needs diuresis, Lasix 40 mg IV bid today.  Aim to keep I/Os negative.  2. RLE DVT/LUE DVT/thrombus in RV/suspect PE: Echo with moderately dilated and moderately dysfunctional RV.  Clot noted on TEE in RV as well.  TTE 1/2 showed normal EF 60-65%, RV improved (mildly dilated/dysfunctional). TEE on 1/5 with moderate to severe RV dysfunction but patient was hypoxemic.  Had 1/2 dose TPA on 1/5.  - Bivalirudin for goal PTT 60-80 (will have to hold for trach).   3. Left PTX: Left chest tube, lung is re-expanded.   4. Shock: Suspect septic/distributive.  Now resolved, off NE.  5. Anemia: Hgb 7.8, transfuse < 8.   - 1 unit PRBCs today.  6. AKI: Resolved.  7. Hyperglycemia: Insulin gtt today.  8. HTN: Following cuff pressure (whip in ABG). 9. CHB: Episode of CHB when hypoxemic.  Now off metoprolol.  NSR today.  10. Epistaxis: Rhinorockets 1/5.   CRITICAL CARE Performed by: Loralie Champagne  Total critical care time: 40 minutes  Critical care time was exclusive of separately billable procedures and treating other patients.  Critical care was necessary to treat or prevent imminent or life-threatening deterioration.  Critical care was time spent personally by me on the following activities: development of treatment plan with patient and/or surrogate as well as nursing, discussions with consultants, evaluation of patient's response to treatment, examination of patient, obtaining history from patient or surrogate, ordering and performing treatments and interventions, ordering and review of laboratory studies, ordering and review of radiographic studies, pulse oximetry and re-evaluation of patient's  condition.    Length of Stay: 9  Loralie Champagne, MD  03/18/2020, 7:41 AM  Advanced Heart Failure Team Pager [phone] (M-F; 7a - 4p)  Please contact [facility] for night-coverage after hours (4p -7a ) and weekends on [email or web address]

## 2020-03-18 NOTE — Progress Notes (Signed)
CRITICAL VALUE ALERT  Critical Value: Vanc trough 30  Date & Time Notied: 03/18/20 @ 0730  Provider Notified:PharmD notified  Orders Received/Actions taken: yes, Vanc discontinued

## 2020-03-18 NOTE — Procedures (Signed)
Diagnostic Bronchoscopy  Tion Tse  [id number]  1970-05-06  Date:03/18/20  Time:2:31 PM   Provider Performing:Anetha Slagel Audrie Lia   Procedure: Diagnostic Bronchoscopy (67544)  Indication(s) Assist with direct visualization of tracheostomy placement  Consent Risks of the procedure as well as the alternatives and risks of each were explained to the patient and/or caregiver.  Consent for the procedure was obtained.   Anesthesia See separate tracheostomy note   Time Out Verified patient identification, verified procedure, site/side was marked, verified correct patient position, special equipment/implants available, medications/allergies/relevant history reviewed, required imaging and test results available.   Sterile Technique Usual hand hygiene, masks, gowns, and gloves were used   Procedure Description Bronchoscope advanced through endotracheal tube and into airway.  After suctioning out tracheal secretions, bronchoscope used to provide direct visualization of tracheostomy placement. Post tracheostomy tube placement, the tube was confirmed to be in the trachea before connecting to MV.   Complications/Tolerance None; patient tolerated the procedure well.   EBL None  Specimen(s) None  Steffanie Dunn, DO 03/18/20 2:32 PM [facility]

## 2020-03-18 NOTE — Progress Notes (Signed)
Son Sharia Reeve called to discuss consent, all questions answered. Consent signed with RN Lillia Abed.  Steffanie Dunn, DO 03/18/20 1:46 PM [facility]

## 2020-03-18 NOTE — Progress Notes (Signed)
ECMO PROGRESS NOTE  NAME:  Alex [name], MRN:  [id number], DOB:  [birth year], LOS: 9 ADMISSION DATE:  02/27/2020, CONSULTATION DATE: 04/09/20 REFERRING MD: Wynona Neat -LBPCCM, CHIEF COMPLAINT: Respiratory failure requiring ECMO  HPI/course in hospital  50 year old man admitted to hospital 12/28 with 1 week history of dyspnea cough nausea and vomiting.  Initially admitted to [facility] long hospital and placed on high flow nasal cannula but rapidly failed and required intubation 12/29.  Persistent hypoxic respiratory failure with PF ratio 55 in spite of 18 of PEEP FiO2 0.1 despite paralytics.  Did not improve with prone ventilation  Cannulated for VV ECMO 12/30 via right IJ crescent cannula. Iatrogenic pneumothorax from left subclavian triple-lumen placement  Family confirms no significant past medical history apart from possible prediabetes  Past Medical History  none  Interim history/subjective:  Reintubated yesterday. Cannula pulled back, no issues since.  Objective   Blood pressure 134/81, pulse 85, temperature 98.24 F (36.8 C), resp. rate (!) 8, height 5\' 4"  (1.626 m), weight 81.6 kg, SpO2 100 %. CVP:  [9 mmHg-19 mmHg] 10 mmHg  Vent Mode: PCV FiO2 (%):  [40 %-100 %] 100 % Set Rate:  [10 bmp] 10 bmp PEEP:  [10 cmH20] 10 cmH20 Plateau Pressure:  [18 cmH20-20 cmH20] 20 cmH20   Intake/Output Summary (Last 24 hours) at 03/18/2020 0709 Last data filed at 03/18/2020 0600 Gross per 24 hour  Intake 3787.06 ml  Output 2190 ml  Net 1597.06 ml   Filed Weights   03/16/20 0530 03/17/20 0400 03/18/20 0500  Weight: 79.9 kg 80.5 kg 81.6 kg    ECMO Device: Cardiohelp  ECMO Mode: VV  Flow (LPM): 4.75   Examination: General: Critically ill-appearing middle-age man lying in bed on mechanical ventilation and ECMO HEENT: [place]/AT, eyes anicteric.  Mild scleral edema.  Nasal packing in place-no active bleeding.  Core track in place, endotracheal tube. Cardio: Regular rate and rhythm, no  murmurs.  Back in NSR. Respiratory: Breathing synchronously with the ventilator, tidal volumes around 200 cc.  Serosanguineous chest tube output, no airleak. Abdomen soft, nontender, nondistended Extremities no clubbing, cyanosis in left hand digits Derm : no rashes or wounds Neuro: RASS -2, nods to answer questions, tracks.    CXR personally reviewed- chest tube in appropriate position, marker on cannula overlying 7th rib space. Bilateral opacities.  Ancillary tests (personally reviewed)  CBC: Recent Labs  Lab 03/12/20 0332 03/12/20 0340 03/13/20 0314 03/13/20 0324 03/14/20 0429 03/14/20 0435 03/16/20 1636 03/16/20 1639 03/17/20 0349 03/17/20 0904 03/17/20 1224 03/17/20 1252 03/17/20 1621 03/17/20 2039 03/17/20 2318 03/18/20 0022 03/18/20 0412 03/18/20 0414  WBC 7.1   < > 7.6   < > 9.4   < > 17.7*  --  17.9*  --  15.3*  --  21.3*  --   --   --  13.6*  --   NEUTROABS 6.1  --  6.6  --  7.4  --   --   --   --   --   --   --   --   --   --   --   --   --   HGB 10.5*   < > 8.9*   < > 9.0*   < > 9.4*   < > 9.5*  9.6*   < > 8.8*   < > 8.6* 8.5* 7.5* 6.8* 7.8* 7.5*  HCT 32.1*   < > 24.1*   < > 25.5*   < > 27.8*   < >  28.0*  28.4*   < > 28.3*   < > 25.6* 25.0* 22.0* 20.0* 25.1* 22.0*  MCV 83.2   < > 82.0   < > 81.5   < > 87.1  --  87.4  --  91.6  --  88.6  --   --   --  90.3  --   PLT 143*   < > 167   < > 139*   < > 145*  --  149*  --  127*  --  140*  --   --   --  127*  --    < > = values in this interval not displayed.    Basic Metabolic Panel: Recent Labs  Lab April 07, 2020 2112 07-Apr-2020 2116 03/12/20 1605 03/12/20 1613 03/13/20 0314 03/13/20 0324 03/16/20 0427 03/16/20 1212 03/16/20 1636 03/16/20 1639 03/17/20 0349 03/17/20 2992 03/17/20 1621 03/17/20 2039 03/17/20 2318 03/18/20 0022 03/18/20 0412 03/18/20 0414  NA 132*   < > 137   < > 137   < > 145  146*   < > 141   < > 144  146*   < > 140 144 145 144 144 144  K 4.2   < > 4.4   < > 4.4   < > 3.9  3.9   < >  4.1   < > 3.8  3.9   < > 4.2 4.4 4.2 4.2 5.0 4.8  CL 101   < > 107  --  106   < > 107  --  106  --  106  --  109  --   --   --  109  --   CO2 20*   < > 23  --  23   < > 28  --  27  --  29  --  21*  --   --   --  27  --   GLUCOSE 290*   < > 382*  --  362*   < > 213*  --  199*  --  156*  --  234*  --   --   --  103*  --   BUN 51*   < > 49*  --  47*   < > 44*  --  38*  --  37*  --  49*  --   --   --  38*  --   CREATININE 1.81*   < > 1.27*  --  0.96   < > 0.94  --  0.86  --  0.84  --  1.29*  --   --   --  0.89  --   CALCIUM 6.1*   < > 6.7*  --  6.9*   < > 7.2*  --  7.3*  --  7.3*  --  6.9*  --   --   --  7.1*  --   MG 2.9*  --  2.8*  --  3.4*  --   --   --   --   --  2.8*  --   --   --   --   --   --   --   PHOS 3.5  --  3.3  --  1.8*  --   --   --   --   --  2.4*  --   --   --   --   --   --   --    < > = values  in this interval not displayed.   GFR: Estimated Creatinine Clearance: 99 mL/min (by C-G formula based on SCr of 0.89 mg/dL). Recent Labs  Lab 03/16/20 1636 03/17/20 0349 03/17/20 1224 03/17/20 1621 03/18/20 0412  WBC 17.7* 17.9* 15.3* 21.3* 13.6*  LATICACIDVEN 1.9 1.8  --  2.7* 2.1*    Liver Function Tests: Recent Labs  Lab 03/15/20 0346 03/16/20 0427 03/17/20 0349 03/17/20 1224 03/18/20 0412  AST 55* 54* 59* 54* 44*  ALT 38 42 43 40 40  ALKPHOS 76 72 123 114 82  BILITOT 1.1 0.9 1.5* 1.5* 1.3*  PROT 4.5* 4.4* 4.7* 4.6* 4.2*  ALBUMIN 3.1* 2.9* 2.9* 3.0* 2.6*   No results for input(s): LIPASE, AMYLASE in the last 168 hours. No results for input(s): AMMONIA in the last 168 hours.  ABG    Component Value Date/Time   PHART 7.368 03/18/2020 0414   PCO2ART 52.7 (H) 03/18/2020 0414   PO2ART 274 (H) 03/18/2020 0414   HCO3 30.4 (H) 03/18/2020 0414   TCO2 32 03/18/2020 0414   ACIDBASEDEF 1.0 03/17/2020 1608   O2SAT 100.0 03/18/2020 0414     Coagulation Profile: Recent Labs  Lab 03/15/20 0346 03/16/20 0427 03/17/20 0349 03/17/20 1621 03/18/20 0412  INR 1.6*  2.0* 1.8* 1.7* 2.0*    Cardiac Enzymes: No results for input(s): CKTOTAL, CKMB, CKMBINDEX, TROPONINI in the last 168 hours.  HbA1C: Hgb A1c MFr Bld  Date/Time Value Ref Range Status  03/04/2020 06:10 PM 11.8 (H) 4.8 - 5.6 % Final    Comment:    (NOTE) Pre diabetes:          5.7%-6.4%  Diabetes:              >6.4%  Glycemic control for   <7.0% adults with diabetes   02/25/2020 08:30 AM 12.2 (H) 4.8 - 5.6 % Final    Comment:    (NOTE) Pre diabetes:          5.7%-6.4%  Diabetes:              >6.4%  Glycemic control for   <7.0% adults with diabetes     CBG: Recent Labs  Lab 03/18/20 0225 03/18/20 0245 03/18/20 0410 03/18/20 0539 03/18/20 0648  GLUCAP 66* 140* 102* 99 88     Assessment & Plan:   Acute hypoxic and hypercarbic respiratory failure requiring VV ECMO support and mechanical ventilation. ARDS due to COVID-19 viral pneumonia Pneumothorax on left Bilateral DVT , cardiac clot in transit. Small PE but hemodynamically stable. Concern for RLL pneumonia -Continue ultra lung protective ventilation.  Planning for trach today. -Continue full ECMO support -Diuresis with Lasix twice daily -Con't chest tube to continuous suction. -Complete courses of remdesivir, corticosteroids, Actemra. -Completed empiric courses of antibiotics for community-acquired pneumonia. Continue empiric antibiotics for resistant organisms- cefepime & vanc. -Will need 3 months of anticoagulation for thromboembolic disease.  Agitation-improved now back on mechanical ventilation. -Continue Precedex infusion; limit as possible -Con't Ativan 0.5 every 4 hours as needed -Con't Seroquel 100 mg BID -con'toxycodone + PRN. Fentanyl PRN for breakthrough. -con't Klonopin to 1 mg TID   Hyperglycemia; uncontrolled DM PTA (A1c 12.2) -Continue insulin infusion; holding long-acting insulin while off tube feeds.  -goal BG 140-180  Acute anemia; suspect hemolytic. LDH improving. Thrombocytopenia;  stable -Transfuse for hemoglobin less than 8 or hemodynamically significant bleeding. 1 unit pRBCs today. -Continue to monitor platelets  Leukocytosis-Concern for right lower lobe pneumonia, likely bacteremia -blood cx 1/4 pending- CONSin 3/4 bottles -Con't empiric  antibiotics vanc, cefepime -trach aspirate pending from overnight  Complete heart block; associated with hypoxia due to cannula positioning issues -Resolved -Discontinue metoprolol  FEN -TF on hold until cortrak repositioned, after trach can restart  Daily Goals Checklist  Pain/Anxiety/Delirium protocol (if indicated): enteral oxycodone and clonazepam, precedex, seroquel VAP protocol (if indicated): extubated DVT prophylaxis: Systemic bivalirudin Nutritional status and feeding goals: High nutritional risk, tube feeds via core track GI prophylaxis: Pantoprazole Urinary catheter: Assessment of intravascular volume Central lines: Right IJ crescent, left subclavian triple-lumen, right radial arterial line Glucose control:  insulin gtt Mobility/therapy needs: progressive mobility Code Status: Full Family Communication: will update son this afternoon Last GoC meeting 03/18/19 with brother, son, friend> con't aggressive care  Multidisciplinary ECMO rounds with cardiology, pharmacy, RNs, ECMO specialists, and PCCM.  This patient is critically ill with multiple organ system failure which requires frequent high complexity decision making, assessment, support, evaluation, and titration of therapies. This was completed through the application of advanced monitoring technologies and extensive interpretation of multiple databases. During this encounter critical care time was devoted to patient care services described in this note for 49 minutes.  Steffanie Dunn, DO 03/18/20 12:28 PM [facility]

## 2020-03-18 NOTE — Progress Notes (Signed)
   Palliative Medicine Inpatient Follow Up Note  HPI:49 y.o. male  with no significant past medical history admitted on 03/23/2020 with dyspnea, cough, nausea/vomiting ~ 1 week ago with worsening symptoms of body aches and fatigue and +COVID 02/07/20 and admitted with shortness of breath. Required intubation 03/10/20. Cannulated for VV ECMO 03/05/2020. Oxygenating better with ECMO. Also evidence of RLE DVT, RV thrombus, and high suspicion of PE. Has required chest tube to L lung for collapse which needs further reposition with recurrent collapse. Trach placed 03-18-20  Today's Discussion (03/14/2020):  Chart reviewed.   Collaborated case with team   Patient continues to  follow commands.  Patient appears more comfortable today after trach procedure   I spoke to [name]/son by telephone and updated him on his father's condition.  Josh verbalizes appreciation for ongoing support from p.m. today.  He has not been in to visit secondary to he himself having some upper respiratory symptoms awaiting results of Covid test.  Questions and concerns addressed   Discussed the importance of continued conversation with his support people (Josh tells me that the patient's brother and best friend have been a great support to him through this difficult time )and the  medical providers regarding overall plan of care and treatment options, ensuring decisions are within the context of the patients values and GOCs.   SUMMARY OF RECOMMENDATIONS   Full Code / Full Scope of treatment  ECMO as a bridge to recovery  Ongoing Palliative care support  Spiritual Support - Christian, discussed with Stephanie Acre  Chaplain with PMT  Time Spent: 15 Greater than 50% of the time was spent in counseling and coordination of care ______________________________________________________________________________________ Lorinda Creed NP [facility] Palliative Medicine Team Team Cell Phone: [phone] Please utilize secure chat  with additional questions, if there is no response within 30 minutes please call the above phone number  Palliative Medicine Team providers are available by phone from 7am to 7pm daily and can be reached through the team cell phone.  Should this patient require assistance outside of these hours, please call the patient's attending physician.

## 2020-03-18 NOTE — Progress Notes (Signed)
PT Cancellation Note  Patient Details Name: Alex [name] MRN: [id number] DOB: 28-Jan-1971   Cancelled Treatment:    Reason Eval/Treat Not Completed: Medical issues which prohibited therapy Janina Mayo to be placed today and nurse asked to HOLD.)   Berline Lopes 03/18/2020, 10:51 AM Salwa Bai W,PT Acute Rehabilitation Services Pager:  [phone]  Office:  [phone]

## 2020-03-18 NOTE — Progress Notes (Signed)
Pharmacy Antibiotic Note  Alex [name] is a 50 y.o. male admitted on 05-Apr-2020 with sepsis.  Pharmacy has been consulted for vancomycin and cefepime dosing.  WBC increasing to 13.6 (on prednisone). LA up slightly to 2.1. Afebrile. Scr stable at 0.89. Vancomycin trough came back supratherapeutic at 30 - however, dose was given on 1/6@0358 , level was collected at 0609. Will plan to get another vancomycin level on 1/6 prior to PM dose.   Plan: Cefepime 2g IV every 8 hours  Hold vancomycin - given likely drawn similar to peak, will get vancomycin trough prior to next time due for dose  Monitor renal fx, cx results, clinical pic  Height: 5\' 4"  (162.6 cm) Weight: 81.6 kg (179 lb 14.3 oz) IBW/kg (Calculated) : 59.2  Temp (24hrs), Avg:98.1 F (36.7 C), Min:97.52 F (36.4 C), Max:98.78 F (37.1 C)  Recent Labs  Lab 03/16/20 0427 03/16/20 1120 03/16/20 1636 03/17/20 0349 03/17/20 1224 03/17/20 1621 03/18/20 0412 03/18/20 0609  WBC 12.3*   < > 17.7* 17.9* 15.3* 21.3* 13.6*  --   CREATININE 0.94  --  0.86 0.84  --  1.29* 0.89  --   LATICACIDVEN 1.9  --  1.9 1.8  --  2.7* 2.1*  --   VANCOTROUGH  --   --   --   --   --   --   --  30*   < > = values in this interval not displayed.    Estimated Creatinine Clearance: 99 mL/min (by C-G formula based on SCr of 0.89 mg/dL).    No Known Allergies  Antimicrobials this admission: Ceftriaxone 12/28 >> 1/1 Azithromycin 12/28 >> 1/1 Vancomycin 1/3 >> Cefepime 1/3 >>  Dose adjustments this admission: VT 30 (drawn after dose) - get vancomycin trough prior to next scheduled dose time  Microbiology results: 1/3 BCx: sent 1/3 UCx: sent   1/3 Sputum: sent  12/28 MRSA PCR: neg   Thank you for allowing pharmacy to be a part of this patient's care.  1/29, PharmD, BCCCP Clinical Pharmacist  Phone: [phone] 03/18/2020 7:42 AM  Please check AMION for all [facility] Pharmacy phone numbers After 10:00 PM, call Main Pharmacy [phone]

## 2020-03-18 NOTE — Procedures (Signed)
Bedside Tracheostomy Insertion Procedure Note   Patient Details:   Name: Alex [name] DOB: [birth year] MRN: [id number]  Procedure: Tracheostomy  Pre Procedure Assessment: ET Tube Size: ET Tube secured at lip (cm): Bite block in place: No Breath Sounds: Clear and Diminished  Post Procedure Assessment: BP 117/64   Pulse 85   Temp 98.42 F (36.9 C)   Resp 15   Ht 5\' 4"  (1.626 m)   Wt 81.6 kg   SpO2 99%   BMI 30.88 kg/m  O2 sats: stable throughout Complications: No apparent complications Patient did tolerate procedure well Tracheostomy Brand:Shiley Tracheostomy Style:Cuffed Tracheostomy Size: 8 Tracheostomy Secured Tracheostomy Placement Confirmation:Trach cuff visualized and in place    Jocilyn Trego 03/18/2020, 2:32 PM

## 2020-03-18 NOTE — Progress Notes (Signed)
Assisted tele visit to patient with family member.  Chrystopher Stangl McEachran, RN  

## 2020-03-18 NOTE — Progress Notes (Signed)
Inpatient Diabetes Program Recommendations  AACE/ADA: New Consensus Statement on Inpatient Glycemic Control (2015)  Target Ranges:  Prepandial:   less than 140 mg/dL      Peak postprandial:   less than 180 mg/dL (1-2 hours)      Critically ill patients:  140 - 180 mg/dL   Lab Results  Component Value Date   GLUCAP 61 (L) 03/18/2020   HGBA1C 11.8 (H) 03/17/2020    Review of Glycemic Control Results for Alex [name], Alex [name] (MRN [id number]) as of 03/18/2020 09:54  Ref. Range 03/18/2020 05:39 03/18/2020 06:48 03/18/2020 07:52 03/18/2020 09:01 03/18/2020 09:25  Glucose-Capillary Latest Ref Range: 70 - 99 mg/dL 99 88 77 65 (L) 61 (L)   Inpatient Diabetes Program Recommendations:   Patient's CBG 61 and did not receive Levemir last hs. -Consider hold Levemir currently and restart as needed. Secure chat sent to Dr. Chestine Spore.  Thank you, Billy Fischer. Inetha Maret, RN, MSN, CDE  Diabetes Coordinator Inpatient Glycemic Control Team Team Pager [phone] (8am-5pm) 03/18/2020 9:56 AM

## 2020-03-18 NOTE — Progress Notes (Signed)
Nutrition Follow-up  DOCUMENTATION CODES:   Not applicable  INTERVENTION:   Tube Feeding via Cortrak once Cortrak placement confirmed via xray: Pivot 1.5 to 65 ml/hr Provides 2340 kcals, 146 g of protein and 1186 mL of free water Meets 100% estimated calorie and protein needs  TF plus free water 200 mL q 4 hours provides    NUTRITION DIAGNOSIS:   Increased nutrient needs related to acute illness,catabolic illness (COVID-19 infection) as evidenced by estimated needs.  Being addressed via TF   GOAL:   Patient will meet greater than or equal to 90% of their needs  Progressing  MONITOR:   Vent status,TF tolerance,Labs,Weight trends  REASON FOR ASSESSMENT:   LOS Enteral/tube feeding initiation and management  ASSESSMENT:   50 y.o. male with no significant medical history. He presented to the ED with dyspnea, cough, and N/V. He reported that his symptoms began with cough 1 week ago. He tried OTC meds but nothing helped. Symptoms worsened to include body aches, fatigue, and N/V 3 days later. He went to his PCP 12/26 and got tested for COVID; he was positive.  12/26 COVID+  12/28 Admitted to [facility] 12/29 Intubated 12/30 Transferred to [facility], VV ECMO cannulation, L PTX with Chest tube insertion 12/31 Cortrak placed, Post-pyloric  1/02 Extubated to HFNC/BiPap as needed, ECHO with EF 60-65% 1/06 TEE for ECMO cannula position, Re-Intubated, Rhinorockets placed for epistaxis, Cortrak malpositioned-repositioned and now gastric per xray   Pt on vent support, sedated with fentanyl and versed. Noted plan for trach today Remains on VV ECMO  Hypoglycemia while TF off due to malpositioned Cortrak tube  No pressure injuries noted per RN skin assessment. Admitted wt 72.1 kg; 81.6 kg current  Chest tube 190 mL   Labs: CBGs 65-134 Meds: insulin gtt   Diet Order:   Diet Order            Diet NPO time specified  Diet effective now                 EDUCATION NEEDS:   Not  appropriate for education at this time  Skin:  Skin Assessment: Reviewed RN Assessment  Last BM:  1/6 rectal tube  Height:   Ht Readings from Last 1 Encounters:  03/08/2020 5\' 4"  (1.626 m)    Weight:   Wt Readings from Last 1 Encounters:  03/18/20 81.6 kg    Ideal Body Weight:  59.1 kg  BMI:  Body mass index is 30.88 kg/m.  Estimated Nutritional Needs:   Kcal:  2160-2520 kcals  Protein:  130-150 g  Fluid:  >/= 2 L   05-30-1992 MS, RDN, LDN, CNSC Registered Dietitian III Clinical Nutrition RD Pager and On-Call Pager Number Located in [place]

## 2020-03-18 NOTE — Progress Notes (Addendum)
ANTICOAGULATION CONSULT NOTE  Pharmacy Consult for Bivalirudin Indication: ECMO + DVTs  No Known Allergies  Patient Measurements: Height: 5\' 4"  (162.6 cm) Weight: 81.6 kg (179 lb 14.3 oz) IBW/kg (Calculated) : 59.2 Heparin Dosing Weight: 72kg  Vital Signs: Temp: 98.24 F (36.8 C) (01/06 0600) Temp Source: Bladder (01/06 0400) BP: 134/81 (01/06 0600) Pulse Rate: 85 (01/06 0600)  Labs: Recent Labs    03/17/20 0349 03/17/20 0904 03/17/20 1224 03/17/20 1252 03/17/20 1621 03/17/20 2039 03/17/20 2111 03/17/20 2318 03/18/20 0022 03/18/20 0412 03/18/20 0414  HGB 9.5*  9.6*   < > 8.8*   < > 8.6*   < >  --    < > 6.8* 7.8* 7.5*  HCT 28.0*  28.4*   < > 28.3*   < > 25.6*   < >  --    < > 20.0* 25.1* 22.0*  PLT 149*  --  127*  --  140*  --   --   --   --  127*  --   APTT 53*  --  69*  --  50*  --  60*  --   --  63*  --   LABPROT 19.9*  --   --   --  19.2*  --   --   --   --  22.3*  --   INR 1.8*  --   --   --  1.7*  --   --   --   --  2.0*  --   CREATININE 0.84  --   --   --  1.29*  --   --   --   --  0.89  --    < > = values in this interval not displayed.    Estimated Creatinine Clearance: 99 mL/min (by C-G formula based on SCr of 0.89 mg/dL).   Medical History: History reviewed. No pertinent past medical history.   Assessment: 40 yoM admitted with COVID-19 PNA with worsening hypoxia now s/p cannulation for ECMO. Pt was started on IV heparin prior to cannulation due to acute DVTs and possible PE, now to transition to bivalirudin.   Pt decompensated this afternoon with RV enlargement and worsening hypoxia so 50 mg tPA started with concern for PE.   aPTT is therapeutic at 63 seconds, on bivalirudin@0 .25 mg/kg/hr. Hgb 7.5, plt 127. Fibrinogen 231, LDH 934. Had nose bleed and bleeding at CT tube after tPA now stable.  Goal of Therapy:  APTT 50-60 seconds - s/p tPA Monitor platelets by anticoagulation protocol: Yes   Plan:  -Continue bivalirudin at 0.25 mg/kg/hr after  discussion with team -Evaluation for possible trach today - bivalirudin will need to be stopped before  57, PharmD, BCCCP Clinical Pharmacist  Phone: [phone] 03/18/2020 7:37 AM  Please check AMION for all [facility] phone numbers After 10:00 PM, call Main Pharmacy [phone]  ADDENDUM Underwent trach 1/6 - was stopped at 11, discussed with MD and okay to restart at 1800. Aiming for goal range of 50-60 - was still high this morning, will reduce rate to 0.23 mg/kg/hr and get level 4 hours after restart.   [phone], PharmD, BCCCP Clinical Pharmacist

## 2020-03-18 NOTE — Procedures (Signed)
Percutaneous Tracheostomy Procedure Note  Alex [name]  [id number]  [birth year]  Date:03/18/20  Time:2:30 PM   Provider Performing:[name] L [name]  Procedure: Percutaneous Tracheostomy with Bronchoscopic Guidance (32202)  Indication(s) Chronic hypoxemic respiratory failure, on ECMO for COVID19  Consent Risks of the procedure as well as the alternatives and risks of each were explained to the patient and/or caregiver.  Consent for the procedure was obtained.  Anesthesia Etomidate, Versed, Fentanyl, Vecuronium  Time Out Verified patient identification, verified procedure, site/side was marked, verified correct patient position, special equipment/implants available, medications/allergies/relevant history reviewed, required imaging and test results available.  Sterile Technique Maximal sterile technique including sterile barrier drape, hand hygiene, sterile gown, sterile gloves, mask, hair covering.   Procedure Description Appropriate anatomy identified by palpation.  Patient's neck prepped and draped in sterile fashion.  1% lidocaine with epinephrine was used to anesthetize skin overlying neck.  1.5cm incision made and blunt dissection performed until tracheal rings could be easily palpated.   Then a size 8 Shiley tracheostomy was placed under bronchoscopic visualization using usual Seldinger technique and serial dilation.   Bronchoscope confirmed placement above the carina. Tracheostomy was sutured in place with adhesive pad to protect skin under pressure.    Patient connected to ventilator.  Complications/Tolerance None; patient tolerated the procedure well. Chest X-ray is ordered to confirm no post-procedural complication.  EBL Minimal  Specimen(s) None   Josephine Igo, DO [facility] 03/18/2020 2:30 PM

## 2020-03-18 NOTE — Progress Notes (Signed)
ANTICOAGULATION CONSULT NOTE  Pharmacy Consult for Bivalirudin Indication: ECMO + DVTs  No Known Allergies  Patient Measurements: Height: 5\' 4"  (162.6 cm) Weight: 81.6 kg (179 lb 14.3 oz) IBW/kg (Calculated) : 59.2 Heparin Dosing Weight: 72kg  Vital Signs: Temp: 98.06 F (36.7 C) (01/06 1900) Temp Source: Bladder (01/06 2000) BP: 107/63 (01/06 1900) Pulse Rate: 83 (01/06 1900)  Labs: Recent Labs    03/17/20 0349 03/17/20 0904 03/17/20 1621 03/17/20 2039 03/18/20 0412 03/18/20 0414 03/18/20 1626 03/18/20 1634 03/18/20 2037 03/18/20 2141  HGB 9.5*  9.6*   < > 8.6*   < > 7.8*   < > 10.4* 10.5* 9.5*  --   HCT 28.0*  28.4*   < > 25.6*   < > 25.1*   < > 32.4* 31.0* 28.0*  --   PLT 149*   < > 140*  --  127*  --  132*  --   --   --   APTT 53*   < > 50*   < > 63*  --  29  --   --  58*  LABPROT 19.9*  --  19.2*  --  22.3*  --   --   --   --   --   INR 1.8*  --  1.7*  --  2.0*  --   --   --   --   --   CREATININE 0.84  --  1.29*  --  0.89  --  0.95  --   --   --    < > = values in this interval not displayed.    Estimated Creatinine Clearance: 92.7 mL/min (by C-G formula based on SCr of 0.95 mg/dL).   Medical History: History reviewed. No pertinent past medical history.   Assessment: 63 yoM admitted with COVID-19 PNA with worsening hypoxia now s/p cannulation for ECMO. Pt was started on IV heparin prior to cannulation due to acute DVTs and possible PE, now to transition to bivalirudin.   Bivalirudin held s/p trach and resumed at 1800. APTT is therapeutic at 58 seconds.  Goal of Therapy:  APTT 50-60 seconds - s/p tPA Monitor platelets by anticoagulation protocol: Yes   Plan:  -Continue bivalirudin at 0.23 mg/kg/hr  -q12h coags  59, PharmD, BCPS, [facility] Clinical Pharmacist [phone] Please check AMION for all [facility] numbers 03/18/2020

## 2020-03-18 NOTE — Progress Notes (Addendum)
Brief Note:   Received notification from RN that Cortrak tube is coiled in patient mouth after being slightly displaced during EEG yesterday. RD unclipped Cortrak and able to pull back on Cortrak tube to release coil in pt's mouth. Cortrak tube re-advanced and clipped at 75 cm. Abd xray ordered to confirm placement prior to use. Discussed with RN  Of note, Cortrak team does NOT have service today  Romelle Starcher MS, RDN, LDN, CNSC Registered Dietitian III Clinical Nutrition RD Pager and On-Call Pager Number Located in [place]

## 2020-03-18 NOTE — Procedures (Signed)
Extracorporeal support note  ECLS cannulation date: 12/30 ECLS support day: 8 Last circuit change: n/a  Indication: Severe respiratory failure secondary to COVID-19 pneumonia with RV dysfunction  Configuration: Venovenous  Drainage cannula: 32 French crescent cannula via right IJ Return cannula: Same  Pump speed: 3400 RPM Pump flow: 4.8 L/min Pump used: Cardio help  Oxygenator: Cardio help O2 blender: 100% Sweep gas: 4L  Circuit check: clot at corners-  Better today Anticoagulant: Bivalirudin Anticoagulation targets: PTT 60-80  Changes in support: Con't current ECMO support. Remains on MV- planning for trach this afternoon.Con't antibiotics.    Anticipated goals/duration of support: Bridge to recovery.  Multidisciplinary ECMO rounds completed.   Steffanie Dunn, DO 03/18/20 12:21 PM [facility]

## 2020-03-18 NOTE — Progress Notes (Signed)
OT Cancellation Note  Patient Details Name: Alex [name] MRN: [id number] DOB: March 07, 1971   Cancelled Treatment:    Reason Eval/Treat Not Completed: Patient not medically ready.  Scheduled for trache, and RN requesting to hold for today, OT to continue efforts.    Shandrea Lusk D Cathryne Mancebo 03/18/2020, 10:46 AM

## 2020-03-19 ENCOUNTER — Inpatient Hospital Stay (HOSPITAL_COMMUNITY): Payer: Medicaid Other

## 2020-03-19 DIAGNOSIS — J9601 Acute respiratory failure with hypoxia: Secondary | ICD-10-CM

## 2020-03-19 DIAGNOSIS — I509 Heart failure, unspecified: Secondary | ICD-10-CM

## 2020-03-19 LAB — CBC
HCT: 27 % — ABNORMAL LOW (ref 39.0–52.0)
HCT: 27.1 % — ABNORMAL LOW (ref 39.0–52.0)
Hemoglobin: 8.6 g/dL — ABNORMAL LOW (ref 13.0–17.0)
Hemoglobin: 9 g/dL — ABNORMAL LOW (ref 13.0–17.0)
MCH: 29.4 pg (ref 26.0–34.0)
MCH: 30.3 pg (ref 26.0–34.0)
MCHC: 31.9 g/dL (ref 30.0–36.0)
MCHC: 33.2 g/dL (ref 30.0–36.0)
MCV: 92.2 fL (ref 80.0–100.0)
MCV: 91.2 fL (ref 80.0–100.0)
Platelets: 121 10*3/uL — ABNORMAL LOW (ref 150–400)
Platelets: 125 10*3/uL — ABNORMAL LOW (ref 150–400)
RBC: 2.93 MIL/uL — ABNORMAL LOW (ref 4.22–5.81)
RBC: 2.97 MIL/uL — ABNORMAL LOW (ref 4.22–5.81)
RDW: 17.9 % — ABNORMAL HIGH (ref 11.5–15.5)
RDW: 18.1 % — ABNORMAL HIGH (ref 11.5–15.5)
WBC: 12.4 10*3/uL — ABNORMAL HIGH (ref 4.0–10.5)
WBC: 15.8 10*3/uL — ABNORMAL HIGH (ref 4.0–10.5)
nRBC: 0.6 % — ABNORMAL HIGH (ref 0.0–0.2)
nRBC: 0.3 % — ABNORMAL HIGH (ref 0.0–0.2)

## 2020-03-19 LAB — GLUCOSE, CAPILLARY
Glucose-Capillary: 111 mg/dL — ABNORMAL HIGH (ref 70–99)
Glucose-Capillary: 116 mg/dL — ABNORMAL HIGH (ref 70–99)
Glucose-Capillary: 142 mg/dL — ABNORMAL HIGH (ref 70–99)
Glucose-Capillary: 150 mg/dL — ABNORMAL HIGH (ref 70–99)
Glucose-Capillary: 166 mg/dL — ABNORMAL HIGH (ref 70–99)
Glucose-Capillary: 173 mg/dL — ABNORMAL HIGH (ref 70–99)
Glucose-Capillary: 175 mg/dL — ABNORMAL HIGH (ref 70–99)
Glucose-Capillary: 192 mg/dL — ABNORMAL HIGH (ref 70–99)
Glucose-Capillary: 205 mg/dL — ABNORMAL HIGH (ref 70–99)
Glucose-Capillary: 210 mg/dL — ABNORMAL HIGH (ref 70–99)
Glucose-Capillary: 222 mg/dL — ABNORMAL HIGH (ref 70–99)
Glucose-Capillary: 226 mg/dL — ABNORMAL HIGH (ref 70–99)
Glucose-Capillary: 266 mg/dL — ABNORMAL HIGH (ref 70–99)
Glucose-Capillary: 266 mg/dL — ABNORMAL HIGH (ref 70–99)
Glucose-Capillary: 275 mg/dL — ABNORMAL HIGH (ref 70–99)

## 2020-03-19 LAB — BASIC METABOLIC PANEL
Anion gap: 9 (ref 5–15)
Anion gap: 10 (ref 5–15)
BUN: 41 mg/dL — ABNORMAL HIGH (ref 6–20)
BUN: 36 mg/dL — ABNORMAL HIGH (ref 6–20)
CO2: 26 mmol/L (ref 22–32)
CO2: 28 mmol/L (ref 22–32)
Calcium: 7.3 mg/dL — ABNORMAL LOW (ref 8.9–10.3)
Calcium: 7.7 mg/dL — ABNORMAL LOW (ref 8.9–10.3)
Chloride: 104 mmol/L (ref 98–111)
Chloride: 104 mmol/L (ref 98–111)
Creatinine, Ser: 1.01 mg/dL (ref 0.61–1.24)
Creatinine, Ser: 0.94 mg/dL (ref 0.61–1.24)
GFR, Estimated: >60 mL/min (ref >60)
GFR, Estimated: >60 mL/min (ref >60)
Glucose, Bld: 255 mg/dL — ABNORMAL HIGH (ref 70–99)
Glucose, Bld: 119 mg/dL — ABNORMAL HIGH (ref 70–99)
Potassium: 4.4 mmol/L (ref 3.5–5.1)
Potassium: 4.7 mmol/L (ref 3.5–5.1)
Sodium: 139 mmol/L (ref 135–145)
Sodium: 142 mmol/L (ref 135–145)

## 2020-03-19 LAB — CULTURE, BLOOD (ROUTINE X 2): Special Requests: ADEQUATE

## 2020-03-19 LAB — LACTIC ACID, PLASMA
Lactic Acid, Venous: 2.4 mmol/L (ref 0.5–1.9)
Lactic Acid, Venous: 1.8 mmol/L (ref 0.5–1.9)

## 2020-03-19 LAB — LACTATE DEHYDROGENASE: LDH: 959 U/L — ABNORMAL HIGH (ref 98–192)

## 2020-03-19 LAB — ECHOCARDIOGRAM LIMITED
Calc EF: 70.3 %
Height: 64 in
S' Lateral: 2.1 cm
Single Plane A2C EF: 67.7 %
Single Plane A4C EF: 74.3 %
Weight: 2825.42 oz

## 2020-03-19 LAB — POCT I-STAT 7, (LYTES, BLD GAS, ICA,H+H)
Acid-Base Excess: 3 mmol/L — ABNORMAL HIGH (ref 0.0–2.0)
Acid-Base Excess: 4 mmol/L — ABNORMAL HIGH (ref 0.0–2.0)
Acid-Base Excess: 7 mmol/L — ABNORMAL HIGH (ref 0.0–2.0)
Bicarbonate: 28.6 mmol/L — ABNORMAL HIGH (ref 20.0–28.0)
Bicarbonate: 29.4 mmol/L — ABNORMAL HIGH (ref 20.0–28.0)
Bicarbonate: 32.4 mmol/L — ABNORMAL HIGH (ref 20.0–28.0)
Calcium, Ion: 1.05 mmol/L — ABNORMAL LOW (ref 1.15–1.40)
Calcium, Ion: 1.11 mmol/L — ABNORMAL LOW (ref 1.15–1.40)
Calcium, Ion: 1.09 mmol/L — ABNORMAL LOW (ref 1.15–1.40)
HCT: 24 % — ABNORMAL LOW (ref 39.0–52.0)
HCT: 27 % — ABNORMAL LOW (ref 39.0–52.0)
HCT: 27 % — ABNORMAL LOW (ref 39.0–52.0)
Hemoglobin: 8.2 g/dL — ABNORMAL LOW (ref 13.0–17.0)
Hemoglobin: 9.2 g/dL — ABNORMAL LOW (ref 13.0–17.0)
Hemoglobin: 9.2 g/dL — ABNORMAL LOW (ref 13.0–17.0)
O2 Saturation: 96 %
O2 Saturation: 94 %
O2 Saturation: 96 %
Patient temperature: 36.9
Patient temperature: 36.9
Potassium: 4.4 mmol/L (ref 3.5–5.1)
Potassium: 4.5 mmol/L (ref 3.5–5.1)
Potassium: 4.6 mmol/L (ref 3.5–5.1)
Sodium: 140 mmol/L (ref 135–145)
Sodium: 142 mmol/L (ref 135–145)
Sodium: 140 mmol/L (ref 135–145)
TCO2: 30 mmol/L (ref 22–32)
TCO2: 31 mmol/L (ref 22–32)
TCO2: 34 mmol/L — ABNORMAL HIGH (ref 22–32)
pCO2 arterial: 47.7 mmHg (ref 32.0–48.0)
pCO2 arterial: 46.9 mmHg (ref 32.0–48.0)
pCO2 arterial: 47.5 mmHg (ref 32.0–48.0)
pH, Arterial: 7.386 (ref 7.350–7.450)
pH, Arterial: 7.405 (ref 7.350–7.450)
pH, Arterial: 7.442 (ref 7.350–7.450)
pO2, Arterial: 84 mmHg (ref 83.0–108.0)
pO2, Arterial: 72 mmHg — ABNORMAL LOW (ref 83.0–108.0)
pO2, Arterial: 78 mmHg — ABNORMAL LOW (ref 83.0–108.0)

## 2020-03-19 LAB — HEPATIC FUNCTION PANEL
ALT: 37 U/L (ref 0–44)
AST: 41 U/L (ref 15–41)
Albumin: 2.9 g/dL — ABNORMAL LOW (ref 3.5–5.0)
Alkaline Phosphatase: 83 U/L (ref 38–126)
Bilirubin, Direct: 0.2 mg/dL (ref 0.0–0.2)
Indirect Bilirubin: 1.2 mg/dL — ABNORMAL HIGH (ref 0.3–0.9)
Total Bilirubin: 1.4 mg/dL — ABNORMAL HIGH (ref 0.3–1.2)
Total Protein: 4.7 g/dL — ABNORMAL LOW (ref 6.5–8.1)

## 2020-03-19 LAB — FIBRINOGEN: Fibrinogen: 310 mg/dL (ref 210–475)

## 2020-03-19 LAB — PROTIME-INR
INR: 1.9 — ABNORMAL HIGH (ref 0.8–1.2)
Prothrombin Time: 21.2 seconds — ABNORMAL HIGH (ref 11.4–15.2)

## 2020-03-19 LAB — APTT
aPTT: 61 seconds — ABNORMAL HIGH (ref 24–36)
aPTT: 58 seconds — ABNORMAL HIGH (ref 24–36)

## 2020-03-19 MED ORDER — DEXMEDETOMIDINE HCL IN NACL 400 MCG/100ML IV SOLN
0.1000 ug/kg/h | INTRAVENOUS | Status: DC
  Administered 2020-03-19: 0.8 ug/kg/h via INTRAVENOUS
  Administered 2020-03-20: 1.2 ug/kg/h via INTRAVENOUS
  Administered 2020-03-20: 1 ug/kg/h via INTRAVENOUS
  Administered 2020-03-20: 1.2 ug/kg/h via INTRAVENOUS
  Administered 2020-03-20: 0.8 ug/kg/h via INTRAVENOUS
  Administered 2020-03-20 – 2020-03-21 (×2): 1.4 ug/kg/h via INTRAVENOUS
  Administered 2020-03-21 (×2): 1.2 ug/kg/h via INTRAVENOUS
  Administered 2020-03-21 (×2): 1.4 ug/kg/h via INTRAVENOUS
  Administered 2020-03-22 (×5): 1.3 ug/kg/h via INTRAVENOUS
  Administered 2020-03-23 (×4): 1.4 ug/kg/h via INTRAVENOUS
  Administered 2020-03-23: 1.3 ug/kg/h via INTRAVENOUS
  Administered 2020-03-24 (×3): 1.4 ug/kg/h via INTRAVENOUS
  Filled 2020-03-19 (×18): qty 100
  Filled 2020-03-19: qty 200
  Filled 2020-03-19 (×4): qty 100

## 2020-03-19 MED ORDER — FUROSEMIDE 10 MG/ML IJ SOLN
4.0000 mg/h | INTRAVENOUS | Status: DC
  Administered 2020-03-19: 10:00:00 4 mg/h via INTRAVENOUS
  Administered 2020-03-20: 8 mg/h via INTRAVENOUS
  Administered 2020-03-21: 6 mg/h via INTRAVENOUS
  Administered 2020-03-22 – 2020-03-23 (×2): 4 mg/h via INTRAVENOUS
  Filled 2020-03-19 (×5): qty 20

## 2020-03-19 MED ORDER — FREE WATER
200.0000 mL | Freq: Four times a day (QID) | Status: DC
  Administered 2020-03-19 – 2020-03-21 (×8): 200 mL

## 2020-03-19 MED ORDER — POLYETHYLENE GLYCOL 3350 17 G PO PACK
17.0000 g | PACK | Freq: Every day | ORAL | Status: DC
  Administered 2020-03-19 – 2020-03-20 (×2): 17 g
  Filled 2020-03-19 (×2): qty 1

## 2020-03-19 MED ORDER — INSULIN DETEMIR 100 UNIT/ML ~~LOC~~ SOLN
50.0000 [IU] | Freq: Two times a day (BID) | SUBCUTANEOUS | Status: DC
  Administered 2020-03-19 – 2020-03-21 (×5): 50 [IU] via SUBCUTANEOUS
  Filled 2020-03-19 (×8): qty 0.5

## 2020-03-19 MED ORDER — SORBITOL 70 % SOLN
30.0000 mL | Freq: Once | Status: AC
  Administered 2020-03-19: 30 mL
  Filled 2020-03-19: qty 30

## 2020-03-19 MED ORDER — ACETAMINOPHEN 325 MG PO TABS
650.0000 mg | ORAL_TABLET | Freq: Three times a day (TID) | ORAL | Status: AC
  Administered 2020-03-19 – 2020-03-20 (×3): 650 mg via ORAL
  Filled 2020-03-19 (×3): qty 2

## 2020-03-19 NOTE — Plan of Care (Signed)
[name] updated via phone. Marcelino Duster from Palliative Care has previously called to give an update today. All questions answered.  Steffanie Dunn, DO 03/19/20 2:49 PM [facility]

## 2020-03-19 NOTE — Progress Notes (Signed)
  Echocardiogram 2D Echocardiogram has been performed.  Alex [name] 03/19/2020, 9:42 AM

## 2020-03-19 NOTE — Progress Notes (Addendum)
   Palliative Medicine Inpatient Follow Up Note  HPI:50 y.o. male  with no significant past medical history admitted on 03/10/2020 with dyspnea, cough, nausea/vomiting ~ 1 week ago with worsening symptoms of body aches and fatigue and +COVID 02/07/20 and admitted with shortness of breath. Required intubation 03/10/20. Cannulated for VV ECMO 02/17/2020. Oxygenating better with ECMO. Also evidence of RLE DVT, RV thrombus, and high suspicion of PE. Has required chest tube to L lung for collapse which needs further reposition with recurrent collapse. Trach placed 03-18-20  Today's Discussion (03/19/2020): Chart reviewed.   Attended ECMO round this morning. S/P tracheostomy yesterday. Tylenol ATC for pain from recent trach placement. Started on a lasix gtt for better volume management. Remains on prednisone. Insulin dosages modified d/t hyperglycemia. Constipation will be treatment more aggressively today give abdominal distention. Agitation is stable at this time on current measures. L pneumothorax improved s/p chest tube w/ re-expansion. Nursing staff maintaining vigilance with L hand given some cyanotic changes. Plan to have PT/OT to optimize mobility.   I reached out to patient son, Sharia Reeve. We discussed the plan for today. He vocalizes having some additional questions regarding medications. I did communicate these to [name], [facility]. She plans to reach out to him this afternoon.   Questions and concerns addressed   Discussed the importance of continued conversation with his support people and the  medical providers regarding overall plan of care and treatment options, ensuring decisions are within the context of the patients values and GOCs.  SUMMARY OF RECOMMENDATIONS   Full Code / Full Scope of treatment  ECMO as a bridge to recovery  Ongoing Palliative care support  Spiritual Support - Christian   Time Spent: 25 Greater than 50% of the time was spent in counseling and coordination of  care ______________________________________________________________________________________ Lamarr Lulas NP [facility] Palliative Medicine Team Team Cell Phone: [phone] Please utilize secure chat with additional questions, if there is no response within 30 minutes please call the above phone number  Palliative Medicine Team providers are available by phone from 7am to 7pm daily and can be reached through the team cell phone.  Should this patient require assistance outside of these hours, please call the patient's attending physician.

## 2020-03-19 NOTE — Progress Notes (Signed)
ECMO PROGRESS NOTE  NAME:  Alex [name], MRN:  [id number], DOB:  07-16-1970, LOS: 50 ADMISSION DATE:  03/10/2020, CONSULTATION DATE: 02/28/2020 REFERRING MD: Ander Slade -LBPCCM, CHIEF COMPLAINT: Respiratory failure requiring ECMO  HPI/course in hospital  50 year old man admitted to hospital 12/28 with 1 week history of dyspnea cough nausea and vomiting.  Initially admitted to [facility] long hospital and placed on high flow nasal cannula but rapidly failed and required intubation 12/29.  Persistent hypoxic respiratory failure with PF ratio 55 in spite of 18 of PEEP FiO2 0.1 despite paralytics.  Did not improve with prone ventilation  Cannulated for VV ECMO 12/30 via right IJ crescent cannula. Iatrogenic pneumothorax from left subclavian triple-lumen placement  Family confirms no significant past medical history apart from possible prediabetes  Past Medical History  none  Interim history/subjective:  Trach yesterday, increased pain afterwards, but better with scheduled tylenol overnight. Denies pain this morning.  Objective   Blood pressure 104/63, pulse 83, temperature 98.42 F (36.9 C), resp. rate 13, height 5\' 4"  (1.626 m), weight 80.1 kg, SpO2 98 %. CVP:  [9 mmHg-25 mmHg] 13 mmHg  Vent Mode: PCV FiO2 (%):  [40 %-100 %] 40 % Set Rate:  [10 bmp] 10 bmp PEEP:  [10 cmH20] 10 cmH20 Plateau Pressure:  [18 cmH20-22 cmH20] 18 cmH20   Intake/Output Summary (Last 24 hours) at 03/19/2020 0709 Last data filed at 03/19/2020 0500 Gross per 24 hour  Intake 4864.75 ml  Output 5555 ml  Net -690.25 ml   Filed Weights   03/17/20 0400 03/18/20 0500 03/19/20 0500  Weight: 80.5 kg 81.6 kg 80.1 kg    ECMO Device: Cardiohelp  ECMO Mode: VV  Flow (LPM): 4.05   Examination: General: critically ill appearing man laying in bed on MV, ECMO HEENT: [place]/AT, eyes anicteric.  Some old bloody secretions from throat with suction.  Nasal packing has been removed.  No anterior bleeding, only dried crusted  blood around nares. Neck: Trach without bleeding, RIJ ECMO cannula.  Left subclavian CVC. Cardio: Regular rate and rhythm, no murmur. Respiratory: Breathing synchronously with mechanical ventilation, tidal volumes around 200.  Rales bilaterally.  Tachypnea without increased work of breathing. Abdomen soft, nontender, nondistended Extremities: left 1st 3 digits cyanotic.  Pitting edema throughout.  Right radial A-line with good distal perfusion. Derm : no rashes or wounds Neuro: RASS 0, nods to answer question, moving all extremities on command.    CXR personally reviewed-bilateral opacities.  Tracheostomy in appropriate position.  Ancillary tests (personally reviewed)  CBC: Recent Labs  Lab 03/13/20 0314 03/13/20 0324 03/14/20 0429 03/14/20 0435 03/17/20 1224 03/17/20 1252 03/17/20 1621 03/17/20 2039 03/18/20 0412 03/18/20 0414 03/18/20 1626 03/18/20 1634 03/18/20 2037 03/19/20 0322 03/19/20 0327  WBC 7.6   < > 9.4   < > 15.3*  --  21.3*  --  13.6*  --  21.4*  --   --  12.4*  --   NEUTROABS 6.6  --  7.4  --   --   --   --   --   --   --   --   --   --   --   --   HGB 8.9*   < > 9.0*   < > 8.8*   < > 8.6*   < > 7.8*   < > 10.4* 10.5* 9.5* 8.6* 8.2*  HCT 24.1*   < > 25.5*   < > 28.3*   < > 25.6*   < >  25.1*   < > 32.4* 31.0* 28.0* 27.0* 24.0*  MCV 82.0   < > 81.5   < > 91.6  --  88.6  --  90.3  --  92.6  --   --  92.2  --   PLT 167   < > 139*   < > 127*  --  140*  --  127*  --  132*  --   --  121*  --    < > = values in this interval not displayed.    Basic Metabolic Panel: Recent Labs  Lab 03/12/20 1605 03/12/20 1613 03/13/20 0314 03/13/20 0324 03/17/20 0349 03/17/20 0904 03/17/20 1621 03/17/20 2039 03/18/20 0412 03/18/20 0414 03/18/20 1626 03/18/20 1634 03/18/20 2037 03/19/20 0322 03/19/20 0327  NA 137   < > 137   < > 144  146*   < > 140   < > 144   < > 140 140 140 139 140  K 4.4   < > 4.4   < > 3.8  3.9   < > 4.2   < > 5.0   < > 5.2* 5.3* 5.6* 4.4 4.4  CL  107  --  106   < > 106  --  109  --  109  --  104  --   --  104  --   CO2 23  --  23   < > 29  --  21*  --  27  --  27  --   --  26  --   GLUCOSE 382*  --  362*   < > 156*  --  234*  --  103*  --  155*  --   --  255*  --   BUN 49*  --  47*   < > 37*  --  49*  --  38*  --  36*  --   --  41*  --   CREATININE 1.27*  --  0.96   < > 0.84  --  1.29*  --  0.89  --  0.95  --   --  1.01  --   CALCIUM 6.7*  --  6.9*   < > 7.3*  --  6.9*  --  7.1*  --  7.3*  --   --  7.3*  --   MG 2.8*  --  3.4*  --  2.8*  --   --   --   --   --   --   --   --   --   --   PHOS 3.3  --  1.8*  --  2.4*  --   --   --   --   --   --   --   --   --   --    < > = values in this interval not displayed.   GFR: Estimated Creatinine Clearance: 86.5 mL/min (by C-G formula based on SCr of 1.01 mg/dL). Recent Labs  Lab 03/17/20 1621 03/18/20 0412 03/18/20 1626 03/18/20 1627 03/19/20 0322  WBC 21.3* 13.6* 21.4*  --  12.4*  LATICACIDVEN 2.7* 2.1*  --  2.1* 2.4*    Liver Function Tests: Recent Labs  Lab 03/16/20 0427 03/17/20 0349 03/17/20 1224 03/18/20 0412 03/19/20 0322  AST 54* 59* 54* 44* 41  ALT 42 43 40 40 37  ALKPHOS 72 123 114 82 83  BILITOT 0.9 1.5* 1.5* 1.3* 1.4*  PROT 4.4* 4.7* 4.6* 4.2*  4.7*  ALBUMIN 2.9* 2.9* 3.0* 2.6* 2.9*   No results for input(s): LIPASE, AMYLASE in the last 168 hours. No results for input(s): AMMONIA in the last 168 hours.  ABG    Component Value Date/Time   PHART 7.386 03/19/2020 0327   PCO2ART 47.7 03/19/2020 0327   PO2ART 84 03/19/2020 0327   HCO3 28.6 (H) 03/19/2020 0327   TCO2 30 03/19/2020 0327   ACIDBASEDEF 1.0 03/17/2020 1608   O2SAT 96.0 03/19/2020 0327     Coagulation Profile: Recent Labs  Lab 03/16/20 0427 03/17/20 0349 03/17/20 1621 03/18/20 0412 03/19/20 0322  INR 2.0* 1.8* 1.7* 2.0* 1.9*    Cardiac Enzymes: No results for input(s): CKTOTAL, CKMB, CKMBINDEX, TROPONINI in the last 168 hours.  HbA1C: Hgb A1c MFr Bld  Date/Time Value Ref Range  Status  02/19/2020 06:10 PM 11.8 (H) 4.8 - 5.6 % Final    Comment:    (NOTE) Pre diabetes:          5.7%-6.4%  Diabetes:              >6.4%  Glycemic control for   <7.0% adults with diabetes   03/01/2020 08:30 AM 12.2 (H) 4.8 - 5.6 % Final    Comment:    (NOTE) Pre diabetes:          5.7%-6.4%  Diabetes:              >6.4%  Glycemic control for   <7.0% adults with diabetes     CBG: Recent Labs  Lab 03/18/20 2140 03/18/20 2258 03/19/20 0013 03/19/20 0118 03/19/20 0216  GLUCAP 259* 278* 266* 275* 266*     Assessment & Plan:   Acute hypoxic and hypercarbic respiratory failure requiring VV ECMO support and mechanical ventilation. ARDS due to COVID-19 viral pneumonia Pneumothorax on left Bilateral DVT , cardiac clot in transit. Small PE but hemodynamically stable. Concern for RLL pneumonia -Continue ultra lung protective ventilation.  Can try trach collar trials depending on work of breathing.Marland Kitchen  SLP evaluation for speech and swallow.  Okay to start PMV trials on the vent. -Continue full ECMO support -Diuresis with Lasix drip due to issues with tracking with bolus dosing. -Con't chest tube to continuous suction. -Routine trach care -Complete courses of remdesivir, corticosteroids, Actemra. -Completed empiric courses of antibiotics for community-acquired pneumonia. Continue empiric antibiotics for resistant organisms- cefepime & vanc. -Will need 3 months of anticoagulation for thromboembolic disease.  Agitation-improved now back on mechanical ventilation. -Continue Precedex infusion; limit as possible -Con't Ativan 0.5 every 4 hours as needed -Con't Seroquel 100 mg BID -con't oxycodone + PRN. Fentanyl PRN for breakthrough. -con't Klonopin to 1 mg TID  -Haldol as needed  Hyperglycemia; uncontrolled DM PTA (A1c 12.2) -Continue insulin infusion; increase Levemir to 50 units twice daily -goal BG 140-180  Acute anemia; suspect hemolytic. LDH  improving. Thrombocytopenia; remains stable -Transfuse for hemoglobin less than 8 or hemodynamically significant bleeding.  -Continue to monitor platelets  Leukocytosis-Concern for right lower lobe pneumonia, likely bacteremia -blood cx 1/4 pending- CONSin 3/4 bottles -Con't empiric antibiotics vanc, cefepime -Continue to follow trach aspirate until finalized  Complete heart block; associated with hypoxia due to cannula positioning issues -Resolved -Continue to hold metoprolol  FEN -Continue tube feeds at goal  Daily Goals Checklist  Pain/Anxiety/Delirium protocol (if indicated): enteral oxycodone and clonazepam, precedex, seroquel VAP protocol (if indicated): extubated DVT prophylaxis: Systemic bivalirudin Nutritional status and feeding goals: High nutritional risk, tube feeds via core track GI prophylaxis: Pantoprazole Urinary  catheter: Assessment of intravascular volume Central lines: Right IJ crescent, left subclavian triple-lumen, right radial arterial line Glucose control:  insulin gtt Mobility/therapy needs: progressive mobility Code Status: Full Family Communication: will update son this afternoon Last GoC meeting 03/18/19 with brother, son, friend> con't aggressive care  Multidisciplinary ECMO rounds with cardiology, pharmacy, RNs, ECMO specialists, and PCCM.  This patient is critically ill with multiple organ system failure which requires frequent high complexity decision making, assessment, support, evaluation, and titration of therapies. This was completed through the application of advanced monitoring technologies and extensive interpretation of multiple databases. During this encounter critical care time was devoted to patient care services described in this note for 46 minutes.  Steffanie Dunn, DO 03/19/20 2:01 PM [facility]

## 2020-03-19 NOTE — Evaluation (Signed)
Occupational Therapy Evaluation Patient Details Name: Alex [name] MRN: [id number] DOB: [birth year] Today's Date: 03/19/2020    History of Present Illness 50 y.o. male  with no significant past medical history admitted on April 07, 2020 with dyspnea, cough, nausea/vomiting ~ 1 week ago with worsening symptoms of body aches and fatigue and +COVID 02/07/20 and admitted with shortness of breath. Required intubation 03/10/20. Cannulated for VV ECMO 03/05/2020. Oxygenating better with ECMO. Also evidence of RLE DVT, RV thrombus, and high suspicion of PE. Has required chest tube to L lung for collapse which needs further reposition with recurrent collapse. Extubated 03-15-19.  Reintubated 1/5 and trach placed 1/6.   Clinical Impression   Patient admitted for the diagnosis above.  PTA he was independent with all mobility and care.  He works full time.  Deficits listed below significantly impair his functional status.  Patient was able to sit edge of bed for a total of 5 minutes with OT/PT ECHO specialist and RN.  Vitals monitored throughout.  Patient with great effort and CIR has been reccommended.  OT will continue to follow in the acute setting.      Follow Up Recommendations  CIR    Equipment Recommendations  None recommended by OT    Recommendations for Other Services       Precautions / Restrictions Precautions Precautions: Fall Precaution Comments: ECMO, Vent, trach collar Restrictions Weight Bearing Restrictions: No      Mobility Bed Mobility Overal bed mobility: Needs Assistance Bed Mobility: Rolling;Sidelying to Sit;Sit to Sidelying Rolling: Mod assist;+2 for physical assistance Sidelying to sit: Mod assist;Max assist;+2 for physical assistance     Sit to sidelying: Max assist;Total assist;+2 for physical assistance General bed mobility comments: Pt needed mod to max assist to EOB with pt initiating movement of UE and LEs and trunk but needed assist due tolines and pt bodynot moving  as expected    Transfers                 General transfer comment: TBA    Balance Overall balance assessment: Needs assistance Sitting-balance support: No upper extremity supported;Feet supported Sitting balance-Leahy Scale: Zero Sitting balance - Comments: Pt sat a total of 5 minutes at EOB with Ecmo specialist and nurse with PT in room.  Pt needed total assist of 2 as he cannot sit up on his own. Tried to kick LE but only got a little of foot movement.  Pt also extended neck to command.  Saturations at EOB 87-92% on Bipap with other VSS.  Incr RR and nurse asked for pt to lie down after 5 min at EOB.                                   ADL either performed or assessed with clinical judgement   ADL Overall ADL's : Needs assistance/impaired Eating/Feeding: NPO   Grooming: Wash/dry hands;Wash/dry face;Maximal assistance   Upper Body Bathing: Total assistance;Bed level   Lower Body Bathing: Total assistance;Bed level   Upper Body Dressing : Total assistance;Bed level   Lower Body Dressing: Total assistance;Bed level       Toileting- Clothing Manipulation and Hygiene: Bed level;Total assistance;+2 for physical assistance               Vision Baseline Vision/History: No visual deficits Vision Assessment?: No apparent visual deficits     Perception     Praxis      Pertinent Vitals/Pain  Pain Assessment: Faces Faces Pain Scale: Hurts little more Pain Location: generalized Pain Descriptors / Indicators: Grimacing;Guarding;Discomfort Pain Intervention(s): Monitored during session     Hand Dominance Right   Extremity/Trunk Assessment Upper Extremity Assessment Upper Extremity Assessment: Generalized weakness   Lower Extremity Assessment Lower Extremity Assessment: Defer to PT evaluation       Communication Communication Communication: Tracheostomy   Cognition Arousal/Alertness: Awake/alert Behavior During Therapy: Flat affect Overall  Cognitive Status: Within Functional Limits for tasks assessed                                     General Comments  O2 sats as low as 83% with activity with FiO2 40% and PEEP 10 on vent.  HR stable during treatment.    Exercises   Shoulder Instructions      Home Living Family/patient expects to be discharged to:: Private residence Living Arrangements: Alone;Other relatives Available Help at Discharge: Available 24 hours/day;Family Type of Home: House Home Access: Stairs to enter Entrance Stairs-Number of Steps: 4 Entrance Stairs-Rails: Right;Left Home Layout: One level     Bathroom Shower/Tub: Chief Strategy Officer: Standard     Home Equipment: None   Additional Comments: Pt coaches football      Prior Functioning/Environment Level of Independence: Independent                 OT Problem List: Decreased strength;Decreased range of motion;Decreased activity tolerance;Impaired balance (sitting and/or standing);Impaired UE functional use;Cardiopulmonary status limiting activity;Pain;Increased edema      OT Treatment/Interventions: Self-care/ADL training;Therapeutic exercise;DME and/or AE instruction;Balance training;Patient/family education;Therapeutic activities    OT Goals(Current goals can be found in the care plan section) Acute Rehab OT Goals Patient Stated Goal: unable to state OT Goal Formulation: Patient unable to participate in goal setting Time For Goal Achievement: 04/02/20 Potential to Achieve Goals: Fair ADL Goals Pt Will Perform Grooming: with min guard assist;sitting Pt Will Perform Upper Body Bathing: with mod assist;sitting Pt Will Perform Upper Body Dressing: with mod assist;sitting Additional ADL Goal #1: Patient will be Mod A times one for supine to sit for increased independence with toilet transfers.  OT Frequency: Min 2X/week   Barriers to D/C:    none noted       Co-evaluation PT/OT/SLP  Co-Evaluation/Treatment: Yes Reason for Co-Treatment: Complexity of the patient's impairments (multi-system involvement);For patient/therapist safety PT goals addressed during session: Mobility/safety with mobility OT goals addressed during session: Other (comment) (bed mobility and sitting tolerance)      AM-PAC OT "6 Clicks" Daily Activity     Outcome Measure Help from another person eating meals?: Total Help from another person taking care of personal grooming?: Total Help from another person toileting, which includes using toliet, bedpan, or urinal?: Total Help from another person bathing (including washing, rinsing, drying)?: Total Help from another person to put on and taking off regular upper body clothing?: Total Help from another person to put on and taking off regular lower body clothing?: Total 6 Click Score: 6   End of Session Equipment Utilized During Treatment: Oxygen Nurse Communication: Mobility status  Activity Tolerance: Patient limited by fatigue Patient left: in bed;with call bell/phone within reach;with nursing/sitter in room  OT Visit Diagnosis: Muscle weakness (generalized) (M62.81)                Time: 0936-1000 OT Time Calculation (min): 24 min Charges:  OT General Charges $OT  Visit: 1 Visit OT Evaluation $OT Eval Moderate Complexity: 1 Mod  03/19/2020  Rich, OTR/L  Acute Rehabilitation Services  Office:  [place] 03/19/2020, 11:53 AM

## 2020-03-19 NOTE — Progress Notes (Signed)
ANTICOAGULATION CONSULT NOTE  Pharmacy Consult for Bivalirudin Indication: ECMO + DVTs  No Known Allergies  Patient Measurements: Height: 5\' 4"  (162.6 cm) Weight: 80.1 kg (176 lb 9.4 oz) IBW/kg (Calculated) : 59.2 Heparin Dosing Weight: 72kg  Vital Signs: Temp: 98.42 F (36.9 C) (01/07 0800) Temp Source: Bladder (01/07 0800) BP: 143/78 (01/07 0800) Pulse Rate: 114 (01/07 0800)  Labs: Recent Labs    03/17/20 1621 03/17/20 2039 03/18/20 0412 03/18/20 0414 03/18/20 1626 03/18/20 1634 03/18/20 2037 03/18/20 2141 03/19/20 0322 03/19/20 0327  HGB 8.6*   < > 7.8*   < > 10.4*   < > 9.5*  --  8.6* 8.2*  HCT 25.6*   < > 25.1*   < > 32.4*   < > 28.0*  --  27.0* 24.0*  PLT 140*  --  127*  --  132*  --   --   --  121*  --   APTT 50*   < > 63*  --  29  --   --  58* 61*  --   LABPROT 19.2*  --  22.3*  --   --   --   --   --  21.2*  --   INR 1.7*  --  2.0*  --   --   --   --   --  1.9*  --   CREATININE 1.29*  --  0.89  --  0.95  --   --   --  1.01  --    < > = values in this interval not displayed.    Estimated Creatinine Clearance: 86.5 mL/min (by C-G formula based on SCr of 1.01 mg/dL).   Medical History: History reviewed. No pertinent past medical history.   Assessment: 57 yoM admitted with COVID-19 PNA with worsening hypoxia, s/p cannulation for ECMO. Pt was started on IV heparin prior to cannulation due to acute DVTs and possible PE,  transitioned to bivalirudin with ECMO.   Bivalirudin held s/p trach 1/6 and resumed at 1800. APTT is therapeutic at 61 seconds on bivalirudin drip 0.23mg /kg/hr.  CBC stable post prbc last pm.  LDH elevated but stable 900s, fibrinogen elevated 200>300 will watch closely. No bleeding  Goal of Therapy:  APTT 50-60 seconds - s/p tPA Monitor platelets by anticoagulation protocol: Yes   Plan:  -Continue bivalirudin at 0.23 mg/kg/hr  -q12h CBC, aptt   59 Pharm.D. CPP, BCPS Clinical Pharmacist [phone] 03/19/2020 8:33  AM    Please check AMION for all [facility] Pharmacy numbers 03/19/2020

## 2020-03-19 NOTE — Progress Notes (Addendum)
Patient ID: Alex [name], male   DOB: 03/23/1970, 50 y.o.   MRN: [id number]     Advanced Heart Failure Rounding Note  PCP-Cardiologist: No primary care provider on file.   Subjective:    - 12/30: VV ECMO cannulation - 12/31: Left chest tube replaced - 1/2: Extubated. Echo with EF 60-65%, mildly dilated RV with mildly decreased systolic function.  - 1/4: Agitated, suspected aspiration.  Re-intubated.  - 1/5: ECMO cannula repositioned under TEE guidance. TEE showed moderately dilated/moderate-severely dysfunctional RV in setting of hypoxemia. LUE DVT found.  Patient got 1/2 dose of TPA due to initial concern for large PE.  - 1/6: Tracheostomy  I/Os even with weight down.  However, chugs with Lasix boluses.    Stable in NSR.    Bivalirudin gtt PTT 61.   On vanomycin/cefepime.   ECMO parameters: 3000 rpm Flow 3.93 L/min Pvenous -46 Delta P 22 Sweep 4 ABG 7.39/48/84/96% LDH 410 => 543 => 728 => 1041 => 998 => 1305 => 934 => 959 Lactate 2.4  Objective:   Weight Range: 80.1 kg Body mass index is 30.31 kg/m.   Vital Signs:   Temp:  [98.06 F (36.7 C)-98.42 F (36.9 C)] 98.42 F (36.9 C) (01/07 0800) Pulse Rate:  [82-125] 114 (01/07 0800) Resp:  [0-34] 29 (01/07 0800) BP: (95-224)/(57-132) 143/78 (01/07 0800) SpO2:  [89 %-100 %] 96 % (01/07 0800) Arterial Line BP: (113-275)/(49-104) 227/75 (01/07 0800) FiO2 (%):  [40 %-50 %] 40 % (01/07 0803) Weight:  [80.1 kg] 80.1 kg (01/07 0500) Last BM Date: 03/18/20  Weight change: Filed Weights   03/17/20 0400 03/18/20 0500 03/19/20 0500  Weight: 80.5 kg 81.6 kg 80.1 kg    Intake/Output:   Intake/Output Summary (Last 24 hours) at 03/19/2020 0851 Last data filed at 03/19/2020 0700 Gross per 24 hour  Intake 5003.66 ml  Output 5410 ml  Net -406.34 ml      Physical Exam    General: Awake, NAD Neck: Tracheostomy. No JVD, no thyromegaly or thyroid nodule.  Lungs: Decreased at bases.  CV: Nondisplaced PMI.  Heart regular  S1/S2, no S3/S4, no murmur.  No peripheral edema.  Abdomen: Soft, nontender, no hepatosplenomegaly, no distention.  Skin: Intact without lesions or rashes.  Neurologic: Alert, follows commands Extremities: No clubbing or cyanosis.  HEENT: Normal.    Telemetry   NSR 70s (personally reviewed)  Labs    CBC Recent Labs    03/18/20 1626 03/18/20 1634 03/19/20 0322 03/19/20 0327  WBC 21.4*  --  12.4*  --   HGB 10.4*   < > 8.6* 8.2*  HCT 32.4*   < > 27.0* 24.0*  MCV 92.6  --  92.2  --   PLT 132*  --  121*  --    < > = values in this interval not displayed.   Basic Metabolic Panel Recent Labs    03/17/20 0349 03/17/20 0904 03/18/20 1626 03/18/20 1634 03/19/20 0322 03/19/20 0327  NA 144  146*   < > 140   < > 139 140  K 3.8  3.9   < > 5.2*   < > 4.4 4.4  CL 106   < > 104  --  104  --   CO2 29   < > 27  --  26  --   GLUCOSE 156*   < > 155*  --  255*  --   BUN 37*   < > 36*  --  41*  --  CREATININE 0.84   < > 0.95  --  1.01  --   CALCIUM 7.3*   < > 7.3*  --  7.3*  --   MG 2.8*  --   --   --   --   --   PHOS 2.4*  --   --   --   --   --    < > = values in this interval not displayed.   Liver Function Tests Recent Labs    03/18/20 0412 03/19/20 0322  AST 44* 41  ALT 40 37  ALKPHOS 82 83  BILITOT 1.3* 1.4*  PROT 4.2* 4.7*  ALBUMIN 2.6* 2.9*   No results for input(s): LIPASE, AMYLASE in the last 72 hours. Cardiac Enzymes No results for input(s): CKTOTAL, CKMB, CKMBINDEX, TROPONINI in the last 72 hours.  BNP: BNP (last 3 results) No results for input(s): BNP in the last 8760 hours.  ProBNP (last 3 results) No results for input(s): PROBNP in the last 8760 hours.   D-Dimer No results for input(s): DDIMER in the last 72 hours. Hemoglobin A1C No results for input(s): HGBA1C in the last 72 hours. Fasting Lipid Panel No results for input(s): CHOL, HDL, LDLCALC, TRIG, CHOLHDL, LDLDIRECT in the last 72 hours. Thyroid Function Tests No results for input(s):  TSH, T4TOTAL, T3FREE, THYROIDAB in the last 72 hours.  Invalid input(s): FREET3  Other results:   Imaging    DG CHEST PORT 1 VIEW  Result Date: 03/19/2020 CLINICAL DATA:  ECMO, COVID pneumonia EXAM: PORTABLE CHEST 1 VIEW COMPARISON:  03/18/2020 FINDINGS: Tracheostomy, nasoenteric feeding tube extending beyond the margin of the examination, left apical medial chest tube, left internal jugular central venous catheter with its tip within the superior vena cava, and ECMO cannula with its middle marker overlying the expected right atrium are all unchanged. Diffuse, extensive pulmonary airspace infiltrate with essential complete whiteout of the a pulmonary parenchyma is again noted, unchanged. No pneumothorax or pleural effusion. Cardiac silhouette is largely obscured by overlying infiltrate. IMPRESSION: Stable examination. Unchanged extensive, diffuse pulmonary infiltrate. Left chest tube in place, no pneumothorax. Electronically Signed   By: Fidela Salisbury MD   On: 03/19/2020 06:45   DG Chest Port 1 View  Result Date: 03/18/2020 CLINICAL DATA:  Tracheostomy placement. EXAM: PORTABLE CHEST 1 VIEW COMPARISON:  Same day chest radiograph FINDINGS: Interval extubation and placement of tracheostomy tube, which appears appropriately positioned. Large bore feeding tube extends beyond the inferior margin of the film. Right IJ approach ECMO cannula and left IJ approach central venous catheter remain in place. Left-sided chest tube projecting near the left lung apex. Heart size is largely obscured. Diffuse bilateral airspace opacities with similar degree of aeration within the bilateral upper lung fields. No discernible pneumothorax. IMPRESSION: 1. Interval extubation and placement of tracheostomy tube, which appears appropriately positioned. 2. Diffuse bilateral airspace opacities with similar degree of aeration within the bilateral upper lung fields. Electronically Signed   By: Davina Poke D.O.   On:  03/18/2020 15:40   DG Abd Portable 1V  Result Date: 03/18/2020 CLINICAL DATA:  Feeding tube placement. EXAM: PORTABLE ABDOMEN - 1 VIEW COMPARISON:  None. FINDINGS: The bowel gas pattern is normal. Distal feeding tube is seen in expected position of distal stomach. No radio-opaque calculi or other significant radiographic abnormality are seen. IMPRESSION: Distal feeding tube tip seen in expected position of distal stomach. Electronically Signed   By: Marijo Conception M.D.   On: 03/18/2020 12:55  Medications:     Scheduled Medications: . sodium chloride   Intravenous Once  . acetaminophen  650 mg Oral Q8H  . chlorhexidine gluconate (MEDLINE KIT)  15 mL Mouth Rinse BID  . Chlorhexidine Gluconate Cloth  6 each Topical Daily  . clonazePAM  1 mg Per Tube Q8H  . docusate  100 mg Per Tube BID  . free water  200 mL Per Tube Q6H  . guaiFENesin-dextromethorphan  10 mL Per Tube BID  . insulin detemir  50 Units Subcutaneous BID  . living well with diabetes book   Does not apply Once  . mouth rinse  15 mL Mouth Rinse 10 times per day  . midazolam  5 mg Intravenous Once  . oxyCODONE  10 mg Per Tube Q6H  . pantoprazole sodium  40 mg Per Tube QHS  . polyethylene glycol  17 g Per Tube Daily  . predniSONE  50 mg Per Tube Daily  . propofol  500 mg Intravenous Once  . QUEtiapine  100 mg Per Tube BID  . sennosides  10 mL Per Tube Daily  . sodium chloride flush  10-40 mL Intracatheter Q12H  . sorbitol  30 mL Per Tube Once    Infusions: . sodium chloride    . sodium chloride 10 mL/hr at 03/19/20 0700  . sodium chloride Stopped (03/18/20 1644)  . sodium chloride Stopped (03/12/20 0131)  . albumin human 12.5 g (03/18/20 1736)  . bivalirudin (ANGIOMAX) infusion 0.5 mg/mL (Non-ACS indications) 0.23 mg/kg/hr (03/19/20 0700)  . ceFEPime (MAXIPIME) IV 2 g (03/19/20 0850)  . dexmedetomidine (PRECEDEX) IV infusion 0.5 mcg/kg/hr (03/19/20 0700)  . feeding supplement (PIVOT 1.5 CAL) 65 mL/hr at 03/18/20  2000  . fentaNYL infusion INTRAVENOUS 150 mcg/hr (03/19/20 0700)  . furosemide (LASIX) 200 mg in dextrose 5% 100 mL (36m/mL) infusion    . insulin 15 mL/hr at 03/19/20 0700  . norepinephrine (LEVOPHED) Adult infusion Stopped (03/17/20 2127)  . vancomycin 200 mL/hr at 03/19/20 0700    PRN Medications: Place/Maintain arterial line **AND** sodium chloride, sodium chloride, sodium chloride, acetaminophen (TYLENOL) oral liquid 160 mg/5 mL, acetaminophen, albumin human, chlorpheniramine-HYDROcodone, dextrose, fentaNYL (SUBLIMAZE) injection, guaiFENesin, haloperidol lactate, hydrALAZINE, labetalol, LORazepam, ondansetron (ZOFRAN) IV, oxyCODONE, sodium chloride flush   Assessment/Plan   1. Acute hypoxemic respiratory failure: Due to COVID-19 PNA with bilateral infiltrates.  Refractory hypoxemia, VV-ECMO cannulation on 03/10/2020 with improvement in oxygenation.  Developed left PTX post-subclavian CVL and has left chest tube, the left lung is re-expanded.  He was extubated 1/2 but reintubated 1/4 with agitation and suspected aspiration.  Tracheostomy 1/5.  CXR with bilateral infiltrates.  He is now on vancomycin/cefepime for empiric abx coverage.  ECMO cannula repositioned 1/5, good ABG today.  LDH elevated but stable.  - ECMO circuit functioning appropriately.  - Tracheostomy today.   - Continue bivalirudin, goal PTT 60-80.  - Patient has had remdesivir, tocilizumab. - Ongoing steroids with prednisone.  - Continue vancomycin/cefepime.    - Will start Lasix gtt 4 mg/hr today.  2. RLE DVT/LUE DVT/thrombus in RV/suspect PE: Echo with moderately dilated and moderately dysfunctional RV.  Clot noted on TEE in RV as well.  TTE 1/2 showed normal EF 60-65%, RV improved (mildly dilated/dysfunctional). TEE on 1/5 with moderate to severe RV dysfunction but patient was hypoxemic.  Had 1/2 dose TPA on 1/5.  - Bivalirudin for goal PTT 60-80.   - Limited echo today for RV function, if improved will stop NO.  3. Left  PTX: Left  chest tube, lung is re-expanded.   4. Shock: Suspect septic/distributive.  Now resolved, off NE.  5. Anemia: Hgb 8.6, transfuse < 8.   6. AKI: Resolved.  7. Hyperglycemia: Insulin gtt today.  8. HTN: Following cuff pressure (whip in ABG). 9. CHB: Episode of CHB when hypoxemic.  Now off metoprolol.  NSR today.  10. Epistaxis: Rhinorockets 1/5.   CRITICAL CARE Performed by: Loralie Champagne  Total critical care time: 40 minutes  Critical care time was exclusive of separately billable procedures and treating other patients.  Critical care was necessary to treat or prevent imminent or life-threatening deterioration.  Critical care was time spent personally by me on the following activities: development of treatment plan with patient and/or surrogate as well as nursing, discussions with consultants, evaluation of patient's response to treatment, examination of patient, obtaining history from patient or surrogate, ordering and performing treatments and interventions, ordering and review of laboratory studies, ordering and review of radiographic studies, pulse oximetry and re-evaluation of patient's condition.    Length of Stay: [place], MD  03/19/2020, 8:51 AM  Advanced Heart Failure Team Pager [phone] (M-F; 7a - 4p)  Please contact [facility] for night-coverage after hours (4p -7a ) and weekends on [email or web address]

## 2020-03-19 NOTE — Progress Notes (Signed)
ANTICOAGULATION CONSULT NOTE  Pharmacy Consult for Bivalirudin Indication: ECMO + DVTs  No Known Allergies  Patient Measurements: Height: 5\' 4"  (162.6 cm) Weight: 80.1 kg (176 lb 9.4 oz) IBW/kg (Calculated) : 59.2 Heparin Dosing Weight: 72kg  Vital Signs: Temp: 98.4 F (36.9 C) (01/07 1600) Temp Source: Core (01/07 1600) BP: 150/74 (01/07 1600) Pulse Rate: 103 (01/07 1600)  Labs: Recent Labs    03/17/20 1621 03/17/20 2039 03/18/20 0412 03/18/20 0414 03/18/20 1626 03/18/20 1634 03/18/20 2141 03/19/20 0322 03/19/20 0327 03/19/20 1234 03/19/20 1545 03/19/20 1555  HGB 8.6*   < > 7.8*   < > 10.4*   < >  --  8.6*   < > 9.2* 9.0* 9.2*  HCT 25.6*   < > 25.1*   < > 32.4*   < >  --  27.0*   < > 27.0* 27.1* 27.0*  PLT 140*  --  127*  --  132*  --   --  121*  --   --  125*  --   APTT 50*   < > 63*  --  29  --  58* 61*  --   --  58*  --   LABPROT 19.2*  --  22.3*  --   --   --   --  21.2*  --   --   --   --   INR 1.7*  --  2.0*  --   --   --   --  1.9*  --   --   --   --   CREATININE 1.29*  --  0.89  --  0.95  --   --  1.01  --   --  0.94  --    < > = values in this interval not displayed.    Estimated Creatinine Clearance: 92.9 mL/min (by C-G formula based on SCr of 0.94 mg/dL).   Medical History: History reviewed. No pertinent past medical history.   Assessment: 31 yoM admitted with COVID-19 PNA with worsening hypoxia, s/p cannulation for ECMO. Pt was started on IV heparin prior to cannulation due to acute DVTs and possible PE,  transitioned to bivalirudin with ECMO.   Bivalirudin held s/p trach 1/6 and resumed at 1800. APTT is therapeutic at 58 seconds on bivalirudin drip 0.23mg /kg/hr.  CBC stable this AM after PRBC last PM. LDH stable, fibrinogen elevated 200>300 from AM labs -  will watch closely. No bleeding reported per RN.  Goal of Therapy:  APTT 50-60 seconds - s/p tPA Monitor platelets by anticoagulation protocol: Yes   Plan:  -Continue bivalirudin at 0.23  mg/kg/hr  -q12h CBC, aptt  Thank you for allowing pharmacy to be a part of this patient's care.  59, PharmD, BCPS Clinical Pharmacist Clinical phone for 03/19/2020: [phone] 03/19/2020 5:12 PM   **Pharmacist phone directory can now be found on [email or web address] ([email or web address]).  Listed under [facility].

## 2020-03-19 NOTE — Progress Notes (Signed)
SLP Cancellation Note  Patient Details Name: Alex [name] MRN: [id number] DOB: 18-May-1970   Cancelled treatment:       Reason Eval/Treat Not Completed: Other (comment) Patient with new tracheostomy on previous date. Orders for SLP eval and treat for PMSV and swallowing received. Will follow pt closely for readiness for SLP interventions as appropriate.     Mahala Menghini., M.A. CCC-SLP Acute Rehabilitation Services Pager [phone] Office [phone]  03/19/2020, 8:17 AM

## 2020-03-19 NOTE — Procedures (Signed)
Extracorporeal support note  ECLS cannulation date: 12/30 ECLS support day: 9 Last circuit change: n/a  Indication: Severe respiratory failure secondary to COVID-19 pneumonia with RV dysfunction  Configuration: Venovenous  Drainage cannula: 32 French crescent cannula via right IJ Return cannula: Same  Pump speed: 3000 RPM Pump flow: 4.0 L/min Pump used: Cardio help  Oxygenator: Cardio help O2 blender: 100% Sweep gas: 4L  Circuit check: clot at corners, especially the left Anticoagulant: Bivalirudin Anticoagulation targets: PTT 50-80  Changes in support: Con't current ECMO support. Remains on MV, can go to [facility] as tolerated with WOB. Con't antibiotics.    Anticipated goals/duration of support: Bridge to recovery.  Multidisciplinary ECMO rounds completed.   Steffanie Dunn, DO 03/19/20 1:54 PM [facility]

## 2020-03-19 NOTE — Progress Notes (Signed)
Physical Therapy Treatment Patient Details Name: Alex [name] MRN: [id number] DOB: 1970-04-24 Today's Date: 03/19/2020    History of Present Illness 50 y.o. male  with no significant past medical history admitted on 03/10/2020 with dyspnea, cough, nausea/vomiting ~ 1 week ago with worsening symptoms of body aches and fatigue and +COVID 02/07/20 and admitted with shortness of breath. Required intubation 03/10/20. Cannulated for VV ECMO 02/21/2020. Oxygenating better with ECMO. Also evidence of RLE DVT, RV thrombus, and high suspicion of PE. Has required chest tube to L lung for collapse which needs further reposition with recurrent collapse. Extubated 03-15-19.  Reintubated 1/5 and trach placed 1/6.    PT Comments    Pt admitted with above diagnosis. Pt was able to sit EOB for up to 10 minutes with max to mod assist with pt at times leaning posteriorly and stating "I cant breathe."  Pt anxious and needed lots of relaxation techniques and cues to not lift right UE as he thrashes around when he becomes anxious. Followed commands to move all 4 extremities.  Pt currently with functional limitations due to balance and endurance deficits. Pt will benefit from skilled PT to increase their independence and safety with mobility to allow discharge to the venue listed below.     Follow Up Recommendations  CIR;Supervision/Assistance - 24 hour     Equipment Recommendations  Other (comment) (TBA)    Recommendations for Other Services       Precautions / Restrictions Precautions Precautions: Fall Precaution Comments: ECMO, Vent, chest tube Restrictions Weight Bearing Restrictions: No    Mobility  Bed Mobility Overal bed mobility: Needs Assistance Bed Mobility: Rolling;Sidelying to Sit;Sit to Sidelying Rolling: Mod assist;+2 for physical assistance (extra 2 persons for ECMO and other lines) Sidelying to sit: Mod assist;Max assist;+2 for physical assistance (extra 2 persons)     Sit to sidelying: Max  assist;Total assist;+2 for physical assistance (extra 2 persons for lines) General bed mobility comments: Pt needed mod to max assist to EOB with pt initiating movement of UE and LEs and trunk but needed assist due tolines and pt bodynot moving as expected  Transfers                 General transfer comment: TBA  Ambulation/Gait             General Gait Details: TBA   Stairs             Wheelchair Mobility    Modified Rankin (Stroke Patients Only)       Balance Overall balance assessment: Needs assistance Sitting-balance support: No upper extremity supported;Feet supported Sitting balance-Leahy Scale: Zero Sitting balance - Comments: Pt sat a total of 5 minutes at EOB with Ecmo specialist and nurse with PT in room.  Pt needed total assist of 2 as he cannot sit up on his own. Tried to kick LE but only got a little of foot movement.  Pt also extended neck to command.  Saturations at EOB 87-92% on Bipap with other VSS.  Incr RR and nurse asked for pt to lie down after 5 min at EOB.                                    Cognition Arousal/Alertness: Awake/alert Behavior During Therapy: Flat affect Overall Cognitive Status: Within Functional Limits for tasks assessed  Exercises General Exercises - Upper Extremity Shoulder Flexion: PROM;Both;5 reps;Supine Elbow Flexion: PROM;Both;5 reps;Supine Elbow Extension: PROM;Both;5 reps;Supine General Exercises - Lower Extremity Ankle Circles/Pumps: PROM;Both;5 reps;Supine Long Arc Quad: AROM;Both;5 reps;Seated Heel Slides: PROM;Both;10 reps;Supine    General Comments General comments (skin integrity, edema, etc.): O2 sats as low as 83% with activity with FiO2 40% and PEEP 10 on vent.  HR stable during treatment.      Pertinent Vitals/Pain Pain Assessment: Faces Faces Pain Scale: Hurts even more Pain Location: generalized Pain Descriptors /  Indicators: Grimacing;Guarding;Discomfort Pain Intervention(s): Limited activity within patient's tolerance;Monitored during session;Repositioned    Home Living                      Prior Function            PT Goals (current goals can now be found in the care plan section) Acute Rehab PT Goals Patient Stated Goal: unable to state Progress towards PT goals: Progressing toward goals    Frequency    Min 3X/week      PT Plan Current plan remains appropriate    Co-evaluation PT/OT/SLP Co-Evaluation/Treatment: Yes Reason for Co-Treatment: Complexity of the patient's impairments (multi-system involvement);For patient/therapist safety PT goals addressed during session: Mobility/safety with mobility        AM-PAC PT "6 Clicks" Mobility   Outcome Measure  Help needed turning from your back to your side while in a flat bed without using bedrails?: A Lot Help needed moving from lying on your back to sitting on the side of a flat bed without using bedrails?: A Lot Help needed moving to and from a bed to a chair (including a wheelchair)?: Total Help needed standing up from a chair using your arms (e.g., wheelchair or bedside chair)?: Total Help needed to walk in hospital room?: Total Help needed climbing 3-5 steps with a railing? : Total 6 Click Score: 8    End of Session Equipment Utilized During Treatment: Other (comment) (vent) Activity Tolerance: Patient limited by fatigue Patient left: in bed;with call bell/phone within reach;with bed alarm set;with restraints reapplied;with nursing/sitter in room Nurse Communication: Mobility status PT Visit Diagnosis: Muscle weakness (generalized) (M62.81);Pain Pain - part of body:  (chest)     Time: 8588-5027 PT Time Calculation (min) (ACUTE ONLY): 25 min  Charges:  $Therapeutic Activity: 8-22 mins                     Luetta Piazza W,PT Acute Rehabilitation Services Pager:  [phone]  Office:  [phone]     Berline Lopes 03/19/2020, 10:22 AM

## 2020-03-20 ENCOUNTER — Inpatient Hospital Stay (HOSPITAL_COMMUNITY): Payer: Medicaid Other

## 2020-03-20 LAB — POCT I-STAT 7, (LYTES, BLD GAS, ICA,H+H)
Acid-Base Excess: 7 mmol/L — ABNORMAL HIGH (ref 0.0–2.0)
Acid-Base Excess: 8 mmol/L — ABNORMAL HIGH (ref 0.0–2.0)
Acid-Base Excess: 6 mmol/L — ABNORMAL HIGH (ref 0.0–2.0)
Acid-Base Excess: 9 mmol/L — ABNORMAL HIGH (ref 0.0–2.0)
Acid-Base Excess: 12 mmol/L — ABNORMAL HIGH (ref 0.0–2.0)
Acid-Base Excess: 12 mmol/L — ABNORMAL HIGH (ref 0.0–2.0)
Bicarbonate: 31.8 mmol/L — ABNORMAL HIGH (ref 20.0–28.0)
Bicarbonate: 33.8 mmol/L — ABNORMAL HIGH (ref 20.0–28.0)
Bicarbonate: 32.4 mmol/L — ABNORMAL HIGH (ref 20.0–28.0)
Bicarbonate: 34.3 mmol/L — ABNORMAL HIGH (ref 20.0–28.0)
Bicarbonate: 36.5 mmol/L — ABNORMAL HIGH (ref 20.0–28.0)
Bicarbonate: 36.8 mmol/L — ABNORMAL HIGH (ref 20.0–28.0)
Calcium, Ion: 1.09 mmol/L — ABNORMAL LOW (ref 1.15–1.40)
Calcium, Ion: 1.13 mmol/L — ABNORMAL LOW (ref 1.15–1.40)
Calcium, Ion: 1.09 mmol/L — ABNORMAL LOW (ref 1.15–1.40)
Calcium, Ion: 1.06 mmol/L — ABNORMAL LOW (ref 1.15–1.40)
Calcium, Ion: 1.05 mmol/L — ABNORMAL LOW (ref 1.15–1.40)
Calcium, Ion: 1.04 mmol/L — ABNORMAL LOW (ref 1.15–1.40)
HCT: 26 % — ABNORMAL LOW (ref 39.0–52.0)
HCT: 25 % — ABNORMAL LOW (ref 39.0–52.0)
HCT: 26 % — ABNORMAL LOW (ref 39.0–52.0)
HCT: 25 % — ABNORMAL LOW (ref 39.0–52.0)
HCT: 25 % — ABNORMAL LOW (ref 39.0–52.0)
HCT: 23 % — ABNORMAL LOW (ref 39.0–52.0)
Hemoglobin: 8.8 g/dL — ABNORMAL LOW (ref 13.0–17.0)
Hemoglobin: 8.5 g/dL — ABNORMAL LOW (ref 13.0–17.0)
Hemoglobin: 8.8 g/dL — ABNORMAL LOW (ref 13.0–17.0)
Hemoglobin: 8.5 g/dL — ABNORMAL LOW (ref 13.0–17.0)
Hemoglobin: 8.5 g/dL — ABNORMAL LOW (ref 13.0–17.0)
Hemoglobin: 7.8 g/dL — ABNORMAL LOW (ref 13.0–17.0)
O2 Saturation: 95 %
O2 Saturation: 98 %
O2 Saturation: 92 %
O2 Saturation: 96 %
O2 Saturation: 97 %
O2 Saturation: 99 %
Patient temperature: 36.9
Patient temperature: 36.8
Patient temperature: 36.8
Patient temperature: 36.8
Patient temperature: 36.7
Patient temperature: 36.7
Potassium: 4.6 mmol/L (ref 3.5–5.1)
Potassium: 4.1 mmol/L (ref 3.5–5.1)
Potassium: 3.9 mmol/L (ref 3.5–5.1)
Potassium: 4 mmol/L (ref 3.5–5.1)
Potassium: 4.2 mmol/L (ref 3.5–5.1)
Potassium: 4 mmol/L (ref 3.5–5.1)
Sodium: 138 mmol/L (ref 135–145)
Sodium: 138 mmol/L (ref 135–145)
Sodium: 138 mmol/L (ref 135–145)
Sodium: 137 mmol/L (ref 135–145)
Sodium: 135 mmol/L (ref 135–145)
Sodium: 135 mmol/L (ref 135–145)
TCO2: 33 mmol/L — ABNORMAL HIGH (ref 22–32)
TCO2: 35 mmol/L — ABNORMAL HIGH (ref 22–32)
TCO2: 34 mmol/L — ABNORMAL HIGH (ref 22–32)
TCO2: 36 mmol/L — ABNORMAL HIGH (ref 22–32)
TCO2: 38 mmol/L — ABNORMAL HIGH (ref 22–32)
TCO2: 38 mmol/L — ABNORMAL HIGH (ref 22–32)
pCO2 arterial: 47.5 mmHg (ref 32.0–48.0)
pCO2 arterial: 53.2 mmHg — ABNORMAL HIGH (ref 32.0–48.0)
pCO2 arterial: 56.1 mmHg — ABNORMAL HIGH (ref 32.0–48.0)
pCO2 arterial: 48.5 mmHg — ABNORMAL HIGH (ref 32.0–48.0)
pCO2 arterial: 44.3 mmHg (ref 32.0–48.0)
pCO2 arterial: 47.1 mmHg (ref 32.0–48.0)
pH, Arterial: 7.434 (ref 7.350–7.450)
pH, Arterial: 7.41 (ref 7.350–7.450)
pH, Arterial: 7.368 (ref 7.350–7.450)
pH, Arterial: 7.457 — ABNORMAL HIGH (ref 7.350–7.450)
pH, Arterial: 7.523 — ABNORMAL HIGH (ref 7.350–7.450)
pH, Arterial: 7.5 — ABNORMAL HIGH (ref 7.350–7.450)
pO2, Arterial: 77 mmHg — ABNORMAL LOW (ref 83.0–108.0)
pO2, Arterial: 98 mmHg (ref 83.0–108.0)
pO2, Arterial: 67 mmHg — ABNORMAL LOW (ref 83.0–108.0)
pO2, Arterial: 79 mmHg — ABNORMAL LOW (ref 83.0–108.0)
pO2, Arterial: 79 mmHg — ABNORMAL LOW (ref 83.0–108.0)
pO2, Arterial: 110 mmHg — ABNORMAL HIGH (ref 83.0–108.0)

## 2020-03-20 LAB — BPAM RBC
Blood Product Expiration Date: 202201202359
Blood Product Expiration Date: 202201202359
Blood Product Expiration Date: 202201212359
Blood Product Expiration Date: 202201232359
Blood Product Expiration Date: 202201252359
ISSUE DATE / TIME: 202201060902
Unit Type and Rh: 7300
Unit Type and Rh: 7300
Unit Type and Rh: 7300
Unit Type and Rh: 7300
Unit Type and Rh: 7300

## 2020-03-20 LAB — CBC
HCT: 26.5 % — ABNORMAL LOW (ref 39.0–52.0)
HCT: 26.5 % — ABNORMAL LOW (ref 39.0–52.0)
Hemoglobin: 8.5 g/dL — ABNORMAL LOW (ref 13.0–17.0)
Hemoglobin: 8.6 g/dL — ABNORMAL LOW (ref 13.0–17.0)
MCH: 29.7 pg (ref 26.0–34.0)
MCH: 29.6 pg (ref 26.0–34.0)
MCHC: 32.1 g/dL (ref 30.0–36.0)
MCHC: 32.5 g/dL (ref 30.0–36.0)
MCV: 92.7 fL (ref 80.0–100.0)
MCV: 91.1 fL (ref 80.0–100.0)
Platelets: 116 10*3/uL — ABNORMAL LOW (ref 150–400)
Platelets: 117 10*3/uL — ABNORMAL LOW (ref 150–400)
RBC: 2.86 MIL/uL — ABNORMAL LOW (ref 4.22–5.81)
RBC: 2.91 MIL/uL — ABNORMAL LOW (ref 4.22–5.81)
RDW: 18.2 % — ABNORMAL HIGH (ref 11.5–15.5)
RDW: 17.9 % — ABNORMAL HIGH (ref 11.5–15.5)
WBC: 12.2 10*3/uL — ABNORMAL HIGH (ref 4.0–10.5)
WBC: 15.2 10*3/uL — ABNORMAL HIGH (ref 4.0–10.5)
nRBC: 0.2 % (ref 0.0–0.2)
nRBC: 0 % (ref 0.0–0.2)

## 2020-03-20 LAB — TYPE AND SCREEN
ABO/RH(D): B POS
Antibody Screen: NEGATIVE
Unit division: 0
Unit division: 0
Unit division: 0
Unit division: 0
Unit division: 0

## 2020-03-20 LAB — GLUCOSE, CAPILLARY
Glucose-Capillary: 124 mg/dL — ABNORMAL HIGH (ref 70–99)
Glucose-Capillary: 154 mg/dL — ABNORMAL HIGH (ref 70–99)
Glucose-Capillary: 148 mg/dL — ABNORMAL HIGH (ref 70–99)
Glucose-Capillary: 131 mg/dL — ABNORMAL HIGH (ref 70–99)
Glucose-Capillary: 149 mg/dL — ABNORMAL HIGH (ref 70–99)
Glucose-Capillary: 155 mg/dL — ABNORMAL HIGH (ref 70–99)
Glucose-Capillary: 149 mg/dL — ABNORMAL HIGH (ref 70–99)
Glucose-Capillary: 160 mg/dL — ABNORMAL HIGH (ref 70–99)
Glucose-Capillary: 134 mg/dL — ABNORMAL HIGH (ref 70–99)
Glucose-Capillary: 119 mg/dL — ABNORMAL HIGH (ref 70–99)
Glucose-Capillary: 173 mg/dL — ABNORMAL HIGH (ref 70–99)
Glucose-Capillary: 172 mg/dL — ABNORMAL HIGH (ref 70–99)
Glucose-Capillary: 164 mg/dL — ABNORMAL HIGH (ref 70–99)
Glucose-Capillary: 162 mg/dL — ABNORMAL HIGH (ref 70–99)

## 2020-03-20 LAB — PROTIME-INR
INR: 1.8 — ABNORMAL HIGH (ref 0.8–1.2)
Prothrombin Time: 20 seconds — ABNORMAL HIGH (ref 11.4–15.2)

## 2020-03-20 LAB — BASIC METABOLIC PANEL
Anion gap: 9 (ref 5–15)
Anion gap: 9 (ref 5–15)
BUN: 32 mg/dL — ABNORMAL HIGH (ref 6–20)
BUN: 36 mg/dL — ABNORMAL HIGH (ref 6–20)
CO2: 31 mmol/L (ref 22–32)
CO2: 32 mmol/L (ref 22–32)
Calcium: 7.5 mg/dL — ABNORMAL LOW (ref 8.9–10.3)
Calcium: 7.6 mg/dL — ABNORMAL LOW (ref 8.9–10.3)
Chloride: 98 mmol/L (ref 98–111)
Chloride: 95 mmol/L — ABNORMAL LOW (ref 98–111)
Creatinine, Ser: 0.89 mg/dL (ref 0.61–1.24)
Creatinine, Ser: 0.89 mg/dL (ref 0.61–1.24)
GFR, Estimated: >60 mL/min (ref >60)
GFR, Estimated: >60 mL/min (ref >60)
Glucose, Bld: 143 mg/dL — ABNORMAL HIGH (ref 70–99)
Glucose, Bld: 161 mg/dL — ABNORMAL HIGH (ref 70–99)
Potassium: 4 mmol/L (ref 3.5–5.1)
Potassium: 4.2 mmol/L (ref 3.5–5.1)
Sodium: 138 mmol/L (ref 135–145)
Sodium: 136 mmol/L (ref 135–145)

## 2020-03-20 LAB — CULTURE, RESPIRATORY W GRAM STAIN: Culture: NORMAL

## 2020-03-20 LAB — LACTATE DEHYDROGENASE: LDH: 1009 U/L — ABNORMAL HIGH (ref 98–192)

## 2020-03-20 LAB — LACTIC ACID, PLASMA
Lactic Acid, Venous: 1.4 mmol/L (ref 0.5–1.9)
Lactic Acid, Venous: 1.7 mmol/L (ref 0.5–1.9)

## 2020-03-20 LAB — HEPATIC FUNCTION PANEL
ALT: 32 U/L (ref 0–44)
AST: 43 U/L — ABNORMAL HIGH (ref 15–41)
Albumin: 2.7 g/dL — ABNORMAL LOW (ref 3.5–5.0)
Alkaline Phosphatase: 98 U/L (ref 38–126)
Bilirubin, Direct: 0.2 mg/dL (ref 0.0–0.2)
Indirect Bilirubin: 1 mg/dL — ABNORMAL HIGH (ref 0.3–0.9)
Total Bilirubin: 1.2 mg/dL (ref 0.3–1.2)
Total Protein: 4.7 g/dL — ABNORMAL LOW (ref 6.5–8.1)

## 2020-03-20 LAB — APTT
aPTT: 59 seconds — ABNORMAL HIGH (ref 24–36)
aPTT: 63 seconds — ABNORMAL HIGH (ref 24–36)

## 2020-03-20 LAB — FIBRINOGEN: Fibrinogen: 365 mg/dL (ref 210–475)

## 2020-03-20 MED ORDER — CLONIDINE HCL 0.2 MG PO TABS
0.2000 mg | ORAL_TABLET | Freq: Three times a day (TID) | ORAL | Status: DC
  Administered 2020-03-20 – 2020-03-22 (×6): 0.2 mg via ORAL
  Filled 2020-03-20 (×6): qty 1

## 2020-03-20 MED ORDER — PREDNISONE 20 MG PO TABS
30.0000 mg | ORAL_TABLET | Freq: Every day | ORAL | Status: AC
  Administered 2020-03-23 – 2020-03-25 (×3): 30 mg
  Filled 2020-03-20 (×3): qty 1

## 2020-03-20 MED ORDER — PREDNISONE 20 MG PO TABS: 20.0000 mg | ORAL_TABLET | Freq: Every day | ORAL | Status: DC

## 2020-03-20 MED ORDER — QUETIAPINE FUMARATE 50 MG PO TABS
150.0000 mg | ORAL_TABLET | Freq: Two times a day (BID) | ORAL | Status: DC
  Administered 2020-03-20 (×2): 150 mg
  Filled 2020-03-20 (×2): qty 1

## 2020-03-20 MED ORDER — PREDNISONE 10 MG PO TABS
10.0000 mg | ORAL_TABLET | Freq: Every day | ORAL | Status: AC
  Administered 2020-03-29 – 2020-03-31 (×3): 10 mg
  Filled 2020-03-20 (×3): qty 1

## 2020-03-20 MED ORDER — PREDNISONE 20 MG PO TABS
40.0000 mg | ORAL_TABLET | Freq: Every day | ORAL | Status: AC
  Administered 2020-03-20 – 2020-03-22 (×3): 40 mg
  Filled 2020-03-20 (×2): qty 2

## 2020-03-20 MED ORDER — PREDNISONE 20 MG PO TABS
20.0000 mg | ORAL_TABLET | Freq: Every day | ORAL | Status: AC
  Administered 2020-03-26 – 2020-03-28 (×3): 20 mg
  Filled 2020-03-20 (×3): qty 1

## 2020-03-20 MED ORDER — ACETAMINOPHEN 325 MG PO TABS
650.0000 mg | ORAL_TABLET | Freq: Three times a day (TID) | ORAL | Status: AC
  Administered 2020-03-20 – 2020-03-21 (×3): 650 mg via ORAL
  Filled 2020-03-20 (×3): qty 2

## 2020-03-20 MED ORDER — OXYCODONE HCL 5 MG PO TABS
15.0000 mg | ORAL_TABLET | Freq: Four times a day (QID) | ORAL | Status: DC
  Administered 2020-03-20 – 2020-03-22 (×8): 15 mg
  Filled 2020-03-20 (×8): qty 3

## 2020-03-20 NOTE — Plan of Care (Signed)
  Problem: Coping: Goal: Psychosocial and spiritual needs will be supported Outcome: Progressing   Problem: Respiratory: Goal: Will maintain a patent airway Outcome: Progressing   Problem: Activity: Goal: Ability to tolerate increased activity will improve Outcome: Progressing   Problem: Role Relationship: Goal: Method of communication will improve Outcome: Progressing

## 2020-03-20 NOTE — Progress Notes (Signed)
Per Dr. Chestine Spore, request RT to change pt's ventilator mode from adult to infant while pt is at rest. Once pt wakes up, switch him back to adult mode. RT changed mode to infant. Pt tolerated well.

## 2020-03-20 NOTE — Progress Notes (Signed)
   Palliative Medicine Inpatient Follow Up Note  HPI: 50 y.o. male  with no significant past medical history admitted on 04/03/20 with dyspnea, cough, nausea/vomiting ~ 1 week ago with worsening symptoms of body aches and fatigue and +COVID 02/07/20 and admitted with shortness of breath. Required intubation 03/10/20. Cannulated for VV ECMO 03/09/2020. Oxygenating better with ECMO. Also evidence of RLE DVT, RV thrombus, and high suspicion of PE. Has required chest tube to L lung for collapse which needs further reposition with recurrent collapse. Trach placed 03-18-20  Today's Discussion (03/20/2020): Chart reviewed.   Attended ECMO round this morning. S/P tracheostomy on 1/6. Tylenol ATC for pain from recent trach placement. Started on a lasix gtt yesterday for better volume management. Remains on prednisone. Insulin gtt and long acting continue to be modified by pharmacy. Abd distention improving. Agitation is stable at this time on current measures. L pneumothorax improved s/p chest tube w/ re-expansion. Nursing staff maintaining vigilance with L hand given some cyanotic changes. Plan to have PT/OT to optimize mobility. Plan to have speech start working with patient so that he may be able to communicate more thoroughly possibly with a passy muir valve.  Upon patient assessment he was noted to be anxious - repositioned and bolused with fentanyl.   Josh called this afternoon. Reviewed the patients present health state. He expresses that all of his questions were answered yesterday to his satisfaction and is thankful for the continued - coordinated efforts of all teams involved.   Discussed the importance of continued conversation with his support people and the  medical providers regarding overall plan of care and treatment options, ensuring decisions are within the context of the patients values and GOCs.  SUMMARY OF RECOMMENDATIONS   Full Code / Full Scope of treatment  ECMO as a bridge to  recovery  Ongoing Palliative care support  Spiritual Support - Christian   Time Spent: 25 Greater than 50% of the time was spent in counseling and coordination of care ______________________________________________________________________________________ Lamarr Lulas NP [facility] Palliative Medicine Team Team Cell Phone: [phone] Please utilize secure chat with additional questions, if there is no response within 30 minutes please call the above phone number  Palliative Medicine Team providers are available by phone from 7am to 7pm daily and can be reached through the team cell phone.  Should this patient require assistance outside of these hours, please call the patient's attending physician.

## 2020-03-20 NOTE — Progress Notes (Signed)
ANTICOAGULATION CONSULT NOTE  Pharmacy Consult for Bivalirudin Indication: ECMO + DVTs  No Known Allergies  Patient Measurements: Height: 5\' 4"  (162.6 cm) Weight: 77.5 kg (170 lb 13.7 oz) IBW/kg (Calculated) : 59.2 Heparin Dosing Weight: 72kg  Vital Signs: Temp: 98.42 F (36.9 C) (01/08 0715) BP: 105/67 (01/07 2300) Pulse Rate: 93 (01/08 0715)  Labs: Recent Labs    03/18/20 0412 03/18/20 0414 03/19/20 0322 03/19/20 0327 03/19/20 1545 03/19/20 1555 03/19/20 2050 03/20/20 0408 03/20/20 0415  HGB 7.8*   < > 8.6*   < > 9.0*   < > 8.8* 8.5* 8.5*  HCT 25.1*   < > 27.0*   < > 27.1*   < > 26.0* 26.5* 25.0*  PLT 127*   < > 121*  --  125*  --   --  116*  --   APTT 63*   < > 61*  --  58*  --   --  59*  --   LABPROT 22.3*  --  21.2*  --   --   --   --  20.0*  --   INR 2.0*  --  1.9*  --   --   --   --  1.8*  --   CREATININE 0.89   < > 1.01  --  0.94  --   --  0.89  --    < > = values in this interval not displayed.    Estimated Creatinine Clearance: 96.5 mL/min (by C-G formula based on SCr of 0.89 mg/dL).   Medical History: History reviewed. No pertinent past medical history.   Assessment: 63 yoM admitted with COVID-19 PNA with worsening hypoxia, s/p cannulation for ECMO. Pt was started on IV heparin prior to cannulation due to acute DVTs and possible PE,  transitioned to bivalirudin with ECMO. Now s/p tPA on 1/5 and tracheostomy on 1/6.  APTT is therapeutic at 59 seconds on bivalirudin drip 0.23mg /kg/hr. After discussion with Dr. 3/6 and Dr. Chestine Spore, will increase aPTT goal to 60-80 seconds since now 1.5 days out from trach and no bleeding.  Hgb up to 8.5 this morning, platelets stable 116. LDH up slightly 1009, fibrinogen up 365 this morning -  will watch closely.  Goal of Therapy:  APTT 60-80 seconds - new goal 1/8 Monitor platelets by anticoagulation protocol: Yes   Plan:  -Increase bivalirudin to 0.25 mg/kg/hr  -q12h CBC, aptt  Shirlee Latch, PharmD, BCPS PGY2  Cardiology Pharmacy Resident Phone: [phone] 03/20/2020  7:30 AM  Please check [email or web address] for unit-specific pharmacy phone numbers.

## 2020-03-20 NOTE — Progress Notes (Signed)
ECMO PROGRESS NOTE  NAME:  Alex [name], MRN:  [id number], DOB:  07-22-1970, LOS: 11 ADMISSION DATE:  08-Apr-2020, CONSULTATION DATE: 03/06/2020 REFERRING MD: Wynona Neat -LBPCCM, CHIEF COMPLAINT: Respiratory failure requiring ECMO  HPI/course in hospital  50 year old man admitted to hospital 12/28 with 1 week history of dyspnea cough nausea and vomiting.  Initially admitted to [facility] long hospital and placed on high flow nasal cannula but rapidly failed and required intubation 12/29.  Persistent hypoxic respiratory failure with PF ratio 55 in spite of 18 of PEEP FiO2 0.1 despite paralytics.  Did not improve with prone ventilation  Cannulated for VV ECMO 12/30 via right IJ crescent cannula. Iatrogenic pneumothorax from left subclavian triple-lumen placement  Family confirms no significant past medical history apart from possible prediabetes  Past Medical History  none  Interim history/subjective:  Awake, endorses some pain, but not localizing. He wants to get OOB as much as possible. Complains of dyspnea.  Objective   Blood pressure 105/67, pulse 99, temperature 98.42 F (36.9 C), resp. rate (!) 30, height 5\' 4"  (1.626 m), weight 77.5 kg, SpO2 97 %. CVP:  [8 mmHg-12 mmHg] 8 mmHg  Vent Mode: PCV FiO2 (%):  [40 %-50 %] 50 % Set Rate:  [10 bmp] 10 bmp PEEP:  [10 cmH20] 10 cmH20 Plateau Pressure:  [20 cmH20-22 cmH20] 20 cmH20   Intake/Output Summary (Last 24 hours) at 03/20/2020 0717 Last data filed at 03/20/2020 0700 Gross per 24 hour  Intake 5667.84 ml  Output 6590 ml  Net -922.16 ml   Filed Weights   03/18/20 0500 03/19/20 0500 03/20/20 0645  Weight: 81.6 kg 80.1 kg 77.5 kg    ECMO Device: Cardiohelp  ECMO Mode: VV  Flow (LPM): 4.04   Examination: General: critically ill appearing man laying in bed, awake and alert. On MV and ECMO. HEENT: [place]/AT, eyes anicteric. Oral mucosa moist. No bloody secretions suctioned from mouth today.  Neck: trach w/o bleeding, RIJ ECMO cannula.  L subclavian line. Cardio: RRR, no murmurs Respiratory: breathing synchronously, tahcypneic, Vt ~400cc Abdomen soft, nontender, nondistended Extremities: left hand with cyanosis in first 3 digits, bounding radial artery pulse. R radial Aline. Derm : no wounds or rashes. Ecchymoses around left chest tube site towards axilla. Neuro: RASS 0, moving all extremities, trying to write to communicate. CAM ICU negative.    CXR personally reviewed- b/l dense bilateral opacities, ECMO cannula marker at 7th rib, trach in appropriate position  Ancillary tests (personally reviewed)  CBC: Recent Labs  Lab 03/14/20 0429 03/14/20 0435 03/18/20 0412 03/18/20 0414 03/18/20 1626 03/18/20 1634 03/19/20 0322 03/19/20 0327 03/19/20 1545 03/19/20 1555 03/19/20 2050 03/20/20 0408 03/20/20 0415  WBC 9.4   < > 13.6*  --  21.4*  --  12.4*  --  15.8*  --   --  12.2*  --   NEUTROABS 7.4  --   --   --   --   --   --   --   --   --   --   --   --   HGB 9.0*   < > 7.8*   < > 10.4*   < > 8.6*   < > 9.0* 9.2* 8.8* 8.5* 8.5*  HCT 25.5*   < > 25.1*   < > 32.4*   < > 27.0*   < > 27.1* 27.0* 26.0* 26.5* 25.0*  MCV 81.5   < > 90.3  --  92.6  --  92.2  --  91.2  --   --  92.7  --   PLT 139*   < > 127*  --  132*  --  121*  --  125*  --   --  116*  --    < > = values in this interval not displayed.    Basic Metabolic Panel: Recent Labs  Lab 03/17/20 0349 03/17/20 0904 03/18/20 0412 03/18/20 0414 03/18/20 1626 03/18/20 1634 03/19/20 0322 03/19/20 0327 03/19/20 1545 03/19/20 1555 03/19/20 2050 03/20/20 0408 03/20/20 0415  NA 144  146*   < > 144   < > 140   < > 139   < > 142 140 138 138 138  K 3.8  3.9   < > 5.0   < > 5.2*   < > 4.4   < > 4.7 4.6 4.6 4.0 4.1  CL 106   < > 109  --  104  --  104  --  104  --   --  98  --   CO2 29   < > 27  --  27  --  26  --  28  --   --  31  --   GLUCOSE 156*   < > 103*  --  155*  --  255*  --  119*  --   --  143*  --   BUN 37*   < > 38*  --  36*  --  41*  --  36*  --    --  32*  --   CREATININE 0.84   < > 0.89  --  0.95  --  1.01  --  0.94  --   --  0.89  --   CALCIUM 7.3*   < > 7.1*  --  7.3*  --  7.3*  --  7.7*  --   --  7.5*  --   MG 2.8*  --   --   --   --   --   --   --   --   --   --   --   --   PHOS 2.4*  --   --   --   --   --   --   --   --   --   --   --   --    < > = values in this interval not displayed.   GFR: Estimated Creatinine Clearance: 96.5 mL/min (by C-G formula based on SCr of 0.89 mg/dL). Recent Labs  Lab 03/18/20 1626 03/18/20 1627 03/19/20 0322 03/19/20 1545 03/20/20 0408  WBC 21.4*  --  12.4* 15.8* 12.2*  LATICACIDVEN  --  2.1* 2.4* 1.8 1.4    Liver Function Tests: Recent Labs  Lab 03/17/20 0349 03/17/20 1224 03/18/20 0412 03/19/20 0322 03/20/20 0408  AST 59* 54* 44* 41 43*  ALT 43 40 40 37 32  ALKPHOS 123 114 82 83 98  BILITOT 1.5* 1.5* 1.3* 1.4* 1.2  PROT 4.7* 4.6* 4.2* 4.7* 4.7*  ALBUMIN 2.9* 3.0* 2.6* 2.9* 2.7*   No results for input(s): LIPASE, AMYLASE in the last 168 hours. No results for input(s): AMMONIA in the last 168 hours.  ABG    Component Value Date/Time   PHART 7.410 03/20/2020 0415   PCO2ART 53.2 (H) 03/20/2020 0415   PO2ART 98 03/20/2020 0415   HCO3 33.8 (H) 03/20/2020 0415   TCO2 35 (H) 03/20/2020 0415   ACIDBASEDEF 1.0 03/17/2020 1608   O2SAT  98.0 03/20/2020 0415     Coagulation Profile: Recent Labs  Lab 03/17/20 0349 03/17/20 1621 03/18/20 0412 03/19/20 0322 03/20/20 0408  INR 1.8* 1.7* 2.0* 1.9* 1.8*    Cardiac Enzymes: No results for input(s): CKTOTAL, CKMB, CKMBINDEX, TROPONINI in the last 168 hours.  HbA1C: Hgb A1c MFr Bld  Date/Time Value Ref Range Status  03/05/2020 06:10 PM 11.8 (H) 4.8 - 5.6 % Final    Comment:    (NOTE) Pre diabetes:          5.7%-6.4%  Diabetes:              >6.4%  Glycemic control for   <7.0% adults with diabetes   02/22/2020 08:30 AM 12.2 (H) 4.8 - 5.6 % Final    Comment:    (NOTE) Pre diabetes:          5.7%-6.4%  Diabetes:               >6.4%  Glycemic control for   <7.0% adults with diabetes     CBG: Recent Labs  Lab 03/19/20 2048 03/19/20 2325 03/20/20 0149 03/20/20 0412 03/20/20 0633  GLUCAP 175* 148* 131* 149* 155*     Assessment & Plan:   Acute hypoxic and hypercarbic respiratory failure requiring VV ECMO support and mechanical ventilation. ARDS due to COVID-19 viral pneumonia Pneumothorax on left Bilateral DVT , cardiac clot in transit. Small PE but hemodynamically stable. Concern for RLL pneumonia -Continue ultra lung protective ventilation.  Can try trach collar trials depending on work of breathing, but his dyspnea has been limiting.  SLP evaluation for speech and swallow.  Okay to start PMV trials on the vent-- will discuss with SLP. -Continue full ECMO support -Diuresis with Lasix drip due to issues with tracking with bolus dosing. -Con't chest tube to continuous suction. -Routine trach care -Complete courses of remdesivir, corticosteroids, Actemra. -Completed empiric courses of antibiotics for community-acquired pneumonia. Continue empiric antibiotics for resistant organisms- cefepime & vanc. -Will need 3 months of anticoagulation for provoked thromboembolic disease. -increasing seroquel and oxycodone to help dyspnea -start steroid taper, 40mg  x 3 days, then decrease by 10mg  Q3 days. Monitor for worsening hypoxia.  Agitation-improved now back on mechanical ventilation. -Continue Precedex infusion; limit as possible. Adding clonidine 0.2 Q8h -Con't Ativan 0.5 every 4 hours as needed -Seroquel 150 mg BID -increase oxycodone to 15 mg !4h + PRN. Fentanyl PRN for breakthrough. -con't Klonopin to 1 mg TID  -Haldol as needed  Hyperglycemia; uncontrolled DM PTA (A1c 12.2) -Continue insulin infusion; con't Levemir to 50 units twice daily -goal BG 140-180 -decreasing steroids today  Acute anemia; suspect hemolytic. LDH improving. Thrombocytopenia; remains stable -Transfuse for  hemoglobin less than 8 or hemodynamically significant bleeding.  -Continue to monitor platelets  Leukocytosis-Concern for right lower lobe pneumonia, likely bacteremia -blood cx 1/4 pending- CONS in 3/4 bottles -Con't empiric antibiotics vanc, cefepime -Continue to follow trach aspirate until finalized  Complete heart block; associated with hypoxia due to cannula positioning issues -Resolved -Continue to hold metoprolol  FEN -Continue tube feeds at goal  Daily Goals Checklist  Pain/Anxiety/Delirium protocol (if indicated): enteral oxycodone and clonazepam, precedex, seroquel, clonidine VAP protocol (if indicated): extubated DVT prophylaxis: Systemic bivalirudin Nutritional status and feeding goals: High nutritional risk, tube feeds via core track GI prophylaxis: Pantoprazole Urinary catheter: Assessment of intravascular volume Central lines: Right IJ crescent, left subclavian triple-lumen, right radial arterial line Glucose control:  insulin gtt, levemir Mobility/therapy needs: progressive mobility Code Status: Full Family Communication:  will update son this afternoon Last GoC meeting 03/18/19 with brother, son, friend> con't aggressive care  Multidisciplinary ECMO rounds with cardiology, pharmacy, RNs, ECMO specialists, and PCCM.  This patient is critically ill with multiple organ system failure which requires frequent high complexity decision making, assessment, support, evaluation, and titration of therapies. This was completed through the application of advanced monitoring technologies and extensive interpretation of multiple databases. During this encounter critical care time was devoted to patient care services described in this note for 55 minutes.  Steffanie Dunn, DO 03/20/20 9:36 AM [facility]

## 2020-03-20 NOTE — Procedures (Signed)
Extracorporeal support note  ECLS cannulation date: 12/30 ECLS support day: 10 Last circuit change: n/a  Indication: Severe respiratory failure secondary to COVID-19 pneumonia with RV dysfunction  Configuration: Venovenous  Drainage cannula: 32 French crescent cannula via right IJ Return cannula: Same  Pump speed: 3000 RPM Pump flow: 4.0 L/min Pump used: Cardio help  Oxygenator: Cardio help O2 blender: 100% Sweep gas: 3.5L  Circuit check: clot at corners, especially the left- stable Anticoagulant: Bivalirudin Anticoagulation targets: PTT 60-80  Changes in support: Con't current ECMO support. Remains on MV, can go to [facility] as tolerated with WOB. Con't antibiotics. He is ready for PT, OT, SLP and is motivated to start.  Anticipated goals/duration of support: Bridge to recovery.  Multidisciplinary ECMO rounds completed.   Steffanie Dunn, DO 03/20/20 9:16 AM [facility]

## 2020-03-20 NOTE — Progress Notes (Signed)
ANTICOAGULATION CONSULT NOTE  Pharmacy Consult for Bivalirudin Indication: ECMO + DVTs  No Known Allergies  Patient Measurements: Height: 5\' 4"  (162.6 cm) Weight: 77.5 kg (170 lb 13.7 oz) IBW/kg (Calculated) : 59.2 Heparin Dosing Weight: 72kg  Vital Signs: Temp: 98.6 F (37 C) (01/08 2000) Temp Source: Core (01/08 1600) BP: 169/82 (01/08 2000) Pulse Rate: 105 (01/08 2000)  Labs: Recent Labs    03/18/20 0412 03/18/20 0414 03/19/20 0322 03/19/20 0327 03/19/20 1545 03/19/20 1555 03/20/20 0408 03/20/20 0415 03/20/20 1638 03/20/20 1650 03/20/20 1808  HGB 7.8*   < > 8.6*   < > 9.0*   < > 8.5*   < > 8.5* 8.6* 7.8*  HCT 25.1*   < > 27.0*   < > 27.1*   < > 26.5*   < > 25.0* 26.5* 23.0*  PLT 127*   < > 121*  --  125*  --  116*  --   --  117*  --   APTT 63*   < > 61*  --  58*  --  59*  --   --  63*  --   LABPROT 22.3*  --  21.2*  --   --   --  20.0*  --   --   --   --   INR 2.0*  --  1.9*  --   --   --  1.8*  --   --   --   --   CREATININE 0.89   < > 1.01  --  0.94  --  0.89  --   --  0.89  --    < > = values in this interval not displayed.    Estimated Creatinine Clearance: 96.5 mL/min (by C-G formula based on SCr of 0.89 mg/dL).   Medical History: History reviewed. No pertinent past medical history.   Assessment: 15 yoM admitted with COVID-19 PNA with worsening hypoxia, s/p cannulation for ECMO. Pt was started on IV heparin prior to cannulation due to acute DVTs and possible PE,  transitioned to bivalirudin with ECMO. Now s/p tPA on 1/5 and tracheostomy on 1/6.  APTT 63 seconds on bivalirudin drip 0.25mg /kg/hr. After discussion with Dr. 3/6 and Dr. Chestine Spore, will increase aPTT goal to 60-80 seconds since now 1.5 days out from trach and no bleeding and + VTE.  Hgb trended down 7.8 will watch closely.  platelets stable 116. LDH up slightly 1009, fibrinogen up 365 this morning -  will watch closely.  Goal of Therapy:  APTT 60-80 seconds - new goal 1/8 Monitor platelets  by anticoagulation protocol: Yes   Plan:  -Continue bivalirudin to 0.25 mg/kg/hr  -q12h CBC, aptt   Shirlee Latch Pharm.D. CPP, BCPS Clinical Pharmacist [phone] 03/20/2020 8:11 PM    Please check [email or web address] for unit-specific pharmacy phone numbers.

## 2020-03-20 NOTE — Progress Notes (Signed)
Patient ID: Alex [name], male   DOB: 1970/08/20, 50 y.o.   MRN: [id number]     Advanced Heart Failure Rounding Note  PCP-Cardiologist: No primary care provider on file.   Subjective:    - 12/30: VV ECMO cannulation - 12/31: Left chest tube replaced - 1/2: Extubated. Echo with EF 60-65%, mildly dilated RV with mildly decreased systolic function.  - 1/4: Agitated, suspected aspiration.  Re-intubated.  - 1/5: ECMO cannula repositioned under TEE guidance. TEE showed moderately dilated/moderate-severely dysfunctional RV in setting of hypoxemia. LUE DVT found.  Patient got 1/2 dose of TPA due to initial concern for large PE.  - 1/6: Tracheostomy - 1/7: Echo with mild RV dilation/mild RV dysfunction.   Good diuresis with Lasix gtt 4 mg/hr, I/Os mildly negative with weight down.     CXR with stable bilateral infiltrates.   Bivalirudin gtt PTT 59.   On vanomycin/cefepime.   ECMO parameters: 3000 rpm Flow 4.0 L/min Pvenous -60 Delta P 21 Sweep 3.5 ABG 7.41/53/98/98% LDH 410 => 543 => 728 => 1041 => 998 => 1305 => 934 => 959 => 1009 Lactate 1.4  Objective:   Weight Range: 77.5 kg Body mass index is 29.33 kg/m.   Vital Signs:   Temp:  [98.06 F (36.7 C)-98.78 F (37.1 C)] 98.42 F (36.9 C) (01/08 0715) Pulse Rate:  [81-134] 93 (01/08 0715) Resp:  [8-36] 28 (01/08 0715) BP: (105-178)/(60-93) 105/67 (01/07 2300) SpO2:  [83 %-100 %] 96 % (01/08 0715) Arterial Line BP: (121-250)/(54-86) 158/63 (01/08 0715) FiO2 (%):  [40 %-50 %] 50 % (01/08 0600) Weight:  [77.5 kg] 77.5 kg (01/08 0645) Last BM Date: 03/19/20  Weight change: Filed Weights   03/18/20 0500 03/19/20 0500 03/20/20 0645  Weight: 81.6 kg 80.1 kg 77.5 kg    Intake/Output:   Intake/Output Summary (Last 24 hours) at 03/20/2020 0738 Last data filed at 03/20/2020 0700 Gross per 24 hour  Intake 5667.84 ml  Output 6590 ml  Net -922.16 ml      Physical Exam    General: NAD Neck: No JVD, no thyromegaly or  thyroid nodule.  Lungs: Decreased at bases.  CV: Nondisplaced PMI.  Heart regular S1/S2, no S3/S4, no murmur.  Trace ankle edema.  Abdomen: Soft, nontender, no hepatosplenomegaly, no distention.  Skin: Intact without lesions or rashes.  Neurologic: Will wake up, follow commands.  Extremities: No clubbing or cyanosis.  HEENT: Normal.    Telemetry   NSR 90s (personally reviewed)  Labs    CBC Recent Labs    03/19/20 1545 03/19/20 1555 03/20/20 0408 03/20/20 0415  WBC 15.8*  --  12.2*  --   HGB 9.0*   < > 8.5* 8.5*  HCT 27.1*   < > 26.5* 25.0*  MCV 91.2  --  92.7  --   PLT 125*  --  116*  --    < > = values in this interval not displayed.   Basic Metabolic Panel Recent Labs    03/19/20 1545 03/19/20 1555 03/20/20 0408 03/20/20 0415  NA 142   < > 138 138  K 4.7   < > 4.0 4.1  CL 104  --  98  --   CO2 28  --  31  --   GLUCOSE 119*  --  143*  --   BUN 36*  --  32*  --   CREATININE 0.94  --  0.89  --   CALCIUM 7.7*  --  7.5*  --    < > =  values in this interval not displayed.   Liver Function Tests Recent Labs    03/19/20 0322 03/20/20 0408  AST 41 43*  ALT 37 32  ALKPHOS 83 98  BILITOT 1.4* 1.2  PROT 4.7* 4.7*  ALBUMIN 2.9* 2.7*   No results for input(s): LIPASE, AMYLASE in the last 72 hours. Cardiac Enzymes No results for input(s): CKTOTAL, CKMB, CKMBINDEX, TROPONINI in the last 72 hours.  BNP: BNP (last 3 results) No results for input(s): BNP in the last 8760 hours.  ProBNP (last 3 results) No results for input(s): PROBNP in the last 8760 hours.   D-Dimer No results for input(s): DDIMER in the last 72 hours. Hemoglobin A1C No results for input(s): HGBA1C in the last 72 hours. Fasting Lipid Panel No results for input(s): CHOL, HDL, LDLCALC, TRIG, CHOLHDL, LDLDIRECT in the last 72 hours. Thyroid Function Tests No results for input(s): TSH, T4TOTAL, T3FREE, THYROIDAB in the last 72 hours.  Invalid input(s): FREET3  Other results:   Imaging     ECHOCARDIOGRAM LIMITED  Result Date: 03/19/2020    ECHOCARDIOGRAM LIMITED REPORT   Patient Name:   Alex [name] Date of Exam: 03/19/2020 Medical Rec #:  [id number]      Height:       64.0 in Accession #:    [id number]     Weight:       176.6 lb Date of Birth:  [birth year]      BSA:          1.856 m Patient Age:    68 years       BP:           143/78 mmHg Patient Gender: M              HR:           104 bpm. Exam Location:  Inpatient Procedure: Limited Echo, Color Doppler and Cardiac Doppler Indications:    Evaluate RV function.; I50.9* Heart failure (unspecified)  History:        Patient has prior history of Echocardiogram examinations, most                 recent 03/17/2020. Signs/Symptoms:Shortness of Breath and Dyspnea.                 Covid positive. Respiratory failure. ECMO. Pericardial effusion.                 Chest tube left side.  Sonographer:    Roseanna Rainbow RDCS Referring Phys: [id number] [facility]  Sonographer Comments: Technically difficult study due to poor echo windows and echo performed with patient supine and on artificial respirator. IMPRESSIONS  1. Left ventricular ejection fraction, by estimation, is 65 to 70%. The left ventricle has hyperdynamic function. The left ventricle has no regional wall motion abnormalities. There is mild left ventricular hypertrophy. Left ventricular diastolic function could not be evaluated.  2. Right ventricular systolic function is mildly reduced. The right ventricular size is mildly enlarged.  3. ECMO catheter noted in right atrium, flow appears to be across the tricuspid valve.  4. The mitral valve is normal in structure. No evidence of mitral valve regurgitation. No evidence of mitral stenosis.  5. The aortic valve is tricuspid. Aortic valve regurgitation is not visualized. No aortic stenosis is present.  6. Limited echo. FINDINGS  Left Ventricle: Left ventricular ejection fraction, by estimation, is 65 to 70%. The left ventricle has hyperdynamic function. The  left ventricle has no regional wall  motion abnormalities. The left ventricular internal cavity size was normal in size. There is mild left ventricular hypertrophy. Left ventricular diastolic function could not be evaluated. Right Ventricle: The right ventricular size is mildly enlarged. Right ventricular systolic function is mildly reduced. Left Atrium: Left atrial size was normal in size. Right Atrium: ECMO catheter noted in right atrium, flow appears to be across the tricuspid valve. Right atrial size was normal in size. Pericardium: Trivial pericardial effusion is present. Mitral Valve: The mitral valve is normal in structure. No evidence of mitral valve stenosis. Tricuspid Valve: The tricuspid valve is normal in structure. Tricuspid valve regurgitation is trivial. Aortic Valve: The aortic valve is tricuspid. Aortic valve regurgitation is not visualized. No aortic stenosis is present. Aorta: The aortic root is normal in size and structure. LEFT VENTRICLE PLAX 2D LVIDd:         3.80 cm LVIDs:         2.10 cm LV PW:         1.30 cm LV IVS:        1.20 cm  LV Volumes (MOD) LV vol d, MOD A2C: 64.1 ml LV vol d, MOD A4C: 86.1 ml LV vol s, MOD A2C: 20.7 ml LV vol s, MOD A4C: 22.1 ml LV SV MOD A2C:     43.4 ml LV SV MOD A4C:     86.1 ml LV SV MOD BP:      53.4 ml LEFT ATRIUM         Index LA diam:    2.60 cm 1.40 cm/m   AORTA Ao Root diam: 3.00 cm Loralie Champagne MD Electronically signed by Loralie Champagne MD Signature Date/Time: 03/19/2020/2:44:03 PM    Final      Medications:     Scheduled Medications: . sodium chloride   Intravenous Once  . chlorhexidine gluconate (MEDLINE KIT)  15 mL Mouth Rinse BID  . Chlorhexidine Gluconate Cloth  6 each Topical Daily  . clonazePAM  1 mg Per Tube Q8H  . docusate  100 mg Per Tube BID  . free water  200 mL Per Tube Q6H  . guaiFENesin-dextromethorphan  10 mL Per Tube BID  . insulin detemir  50 Units Subcutaneous BID  . living well with diabetes book   Does not apply Once   . mouth rinse  15 mL Mouth Rinse 10 times per day  . midazolam  5 mg Intravenous Once  . oxyCODONE  10 mg Per Tube Q6H  . pantoprazole sodium  40 mg Per Tube QHS  . polyethylene glycol  17 g Per Tube Daily  . predniSONE  50 mg Per Tube Daily  . propofol  500 mg Intravenous Once  . QUEtiapine  100 mg Per Tube BID  . sennosides  10 mL Per Tube Daily  . sodium chloride flush  10-40 mL Intracatheter Q12H    Infusions: . sodium chloride    . sodium chloride 10 mL/hr at 03/20/20 0700  . sodium chloride 10 mL/hr at 03/20/20 0700  . sodium chloride Stopped (03/12/20 0131)  . albumin human 12.5 g (03/18/20 1736)  . bivalirudin (ANGIOMAX) infusion 0.5 mg/mL (Non-ACS indications) 0.23 mg/kg/hr (03/20/20 0700)  . ceFEPime (MAXIPIME) IV Stopped (03/20/20 0046)  . dexmedetomidine (PRECEDEX) IV infusion 0.8 mcg/kg/hr (03/20/20 0700)  . feeding supplement (PIVOT 1.5 CAL) 65 mL/hr at 03/20/20 0600  . fentaNYL infusion INTRAVENOUS 200 mcg/hr (03/20/20 0700)  . furosemide (LASIX) 200 mg in dextrose 5% 100 mL (61m/mL) infusion 4 mg/hr (03/20/20  0700)  . insulin 5.5 mL/hr at 03/20/20 0700  . norepinephrine (LEVOPHED) Adult infusion Stopped (03/17/20 2127)  . vancomycin Stopped (03/20/20 0625)    PRN Medications: Place/Maintain arterial line **AND** sodium chloride, sodium chloride, sodium chloride, acetaminophen (TYLENOL) oral liquid 160 mg/5 mL, acetaminophen, albumin human, chlorpheniramine-HYDROcodone, dextrose, fentaNYL (SUBLIMAZE) injection, guaiFENesin, haloperidol lactate, hydrALAZINE, labetalol, LORazepam, ondansetron (ZOFRAN) IV, oxyCODONE, sodium chloride flush   Assessment/Plan   1. Acute hypoxemic respiratory failure: Due to COVID-19 PNA with bilateral infiltrates.  Refractory hypoxemia, VV-ECMO cannulation on 02/16/2020 with improvement in oxygenation.  Developed left PTX post-subclavian CVL and has left chest tube, the left lung is re-expanded.  He was extubated 1/2 but reintubated 1/4  with agitation and suspected aspiration.  Tracheostomy 1/6.  CXR with bilateral infiltrates, unchanged.  He is now on vancomycin/cefepime for empiric abx coverage.  ECMO cannula repositioned 1/5, good ABG today.  LDH elevated but relatively stable.  - ECMO circuit functioning appropriately.  - Continue bivalirudin, goal PTT 60-80 (low today, increase).  - Patient has had remdesivir, tocilizumab. - Ongoing steroids with prednisone.  - Continue vancomycin/cefepime x 7 days.    - Conitnue Lasix gtt 4 mg/hr today, aim for gentle negative I/Os.  2. RLE DVT/LUE DVT/thrombus in RV/suspect PE: Echo with moderately dilated and moderately dysfunctional RV.  Clot noted on TEE in RV as well.  TTE 1/2 showed normal EF 60-65%, RV improved (mildly dilated/dysfunctional). TEE on 1/5 with moderate to severe RV dysfunction but patient was hypoxemic.  Had 1/2 dose TPA on 1/5. Echo 1/7 with mildly dilated/mildly dysfunctional RV.  - Bivalirudin for goal PTT 60-80.   3. Left PTX: Left chest tube, lung is re-expanded.   4. Shock: Suspect septic/distributive.  Now resolved, off NE.  5. Anemia: Hgb 8.5, transfuse < 8.   6. AKI: Resolved.  7. Hyperglycemia: Insulin gtt today.  8. HTN: Following cuff pressure (whip in ABG). 9. CHB: Episode of CHB when hypoxemic.  Now off metoprolol.  NSR today.  10. Thrombocytopenia: Mild, follow.  11. FEN: TFs.   CRITICAL CARE Performed by: Loralie Champagne  Total critical care time: 40 minutes  Critical care time was exclusive of separately billable procedures and treating other patients.  Critical care was necessary to treat or prevent imminent or life-threatening deterioration.  Critical care was time spent personally by me on the following activities: development of treatment plan with patient and/or surrogate as well as nursing, discussions with consultants, evaluation of patient's response to treatment, examination of patient, obtaining history from patient or surrogate,  ordering and performing treatments and interventions, ordering and review of laboratory studies, ordering and review of radiographic studies, pulse oximetry and re-evaluation of patient's condition.    Length of Stay: 71  Loralie Champagne, MD  03/20/2020, 7:38 AM  Advanced Heart Failure Team Pager [phone] (M-F; 7a - 4p)  Please contact [facility] for night-coverage after hours (4p -7a ) and weekends on [email or web address]

## 2020-03-21 ENCOUNTER — Inpatient Hospital Stay (HOSPITAL_COMMUNITY): Payer: Medicaid Other

## 2020-03-21 DIAGNOSIS — R739 Hyperglycemia, unspecified: Secondary | ICD-10-CM

## 2020-03-21 LAB — POCT I-STAT 7, (LYTES, BLD GAS, ICA,H+H)
Acid-Base Excess: 11 mmol/L — ABNORMAL HIGH (ref 0.0–2.0)
Acid-Base Excess: 11 mmol/L — ABNORMAL HIGH (ref 0.0–2.0)
Acid-Base Excess: 14 mmol/L — ABNORMAL HIGH (ref 0.0–2.0)
Bicarbonate: 36.4 mmol/L — ABNORMAL HIGH (ref 20.0–28.0)
Bicarbonate: 36.8 mmol/L — ABNORMAL HIGH (ref 20.0–28.0)
Bicarbonate: 39.8 mmol/L — ABNORMAL HIGH (ref 20.0–28.0)
Calcium, Ion: 1.12 mmol/L — ABNORMAL LOW (ref 1.15–1.40)
Calcium, Ion: 1.05 mmol/L — ABNORMAL LOW (ref 1.15–1.40)
Calcium, Ion: 1.04 mmol/L — ABNORMAL LOW (ref 1.15–1.40)
HCT: 27 % — ABNORMAL LOW (ref 39.0–52.0)
HCT: 23 % — ABNORMAL LOW (ref 39.0–52.0)
HCT: 30 % — ABNORMAL LOW (ref 39.0–52.0)
Hemoglobin: 9.2 g/dL — ABNORMAL LOW (ref 13.0–17.0)
Hemoglobin: 7.8 g/dL — ABNORMAL LOW (ref 13.0–17.0)
Hemoglobin: 10.2 g/dL — ABNORMAL LOW (ref 13.0–17.0)
O2 Saturation: 94 %
O2 Saturation: 88 %
O2 Saturation: 96 %
Patient temperature: 37
Patient temperature: 36.7
Patient temperature: 38.6
Potassium: 4.1 mmol/L (ref 3.5–5.1)
Potassium: 3.8 mmol/L (ref 3.5–5.1)
Potassium: 5 mmol/L (ref 3.5–5.1)
Sodium: 133 mmol/L — ABNORMAL LOW (ref 135–145)
Sodium: 136 mmol/L (ref 135–145)
Sodium: 133 mmol/L — ABNORMAL LOW (ref 135–145)
TCO2: 38 mmol/L — ABNORMAL HIGH (ref 22–32)
TCO2: 38 mmol/L — ABNORMAL HIGH (ref 22–32)
TCO2: 42 mmol/L — ABNORMAL HIGH (ref 22–32)
pCO2 arterial: 50.5 mmHg — ABNORMAL HIGH (ref 32.0–48.0)
pCO2 arterial: 53.8 mmHg — ABNORMAL HIGH (ref 32.0–48.0)
pCO2 arterial: 61.5 mmHg — ABNORMAL HIGH (ref 32.0–48.0)
pH, Arterial: 7.466 — ABNORMAL HIGH (ref 7.350–7.450)
pH, Arterial: 7.442 (ref 7.350–7.450)
pH, Arterial: 7.425 (ref 7.350–7.450)
pO2, Arterial: 70 mmHg — ABNORMAL LOW (ref 83.0–108.0)
pO2, Arterial: 52 mmHg — ABNORMAL LOW (ref 83.0–108.0)
pO2, Arterial: 88 mmHg (ref 83.0–108.0)

## 2020-03-21 LAB — CBC
HCT: 24.2 % — ABNORMAL LOW (ref 39.0–52.0)
HCT: 29.5 % — ABNORMAL LOW (ref 39.0–52.0)
Hemoglobin: 7.7 g/dL — ABNORMAL LOW (ref 13.0–17.0)
Hemoglobin: 10 g/dL — ABNORMAL LOW (ref 13.0–17.0)
MCH: 29.4 pg (ref 26.0–34.0)
MCH: 30.6 pg (ref 26.0–34.0)
MCHC: 31.8 g/dL (ref 30.0–36.0)
MCHC: 33.9 g/dL (ref 30.0–36.0)
MCV: 92.4 fL (ref 80.0–100.0)
MCV: 90.2 fL (ref 80.0–100.0)
Platelets: 114 10*3/uL — ABNORMAL LOW (ref 150–400)
Platelets: 130 10*3/uL — ABNORMAL LOW (ref 150–400)
RBC: 2.62 MIL/uL — ABNORMAL LOW (ref 4.22–5.81)
RBC: 3.27 MIL/uL — ABNORMAL LOW (ref 4.22–5.81)
RDW: 18.2 % — ABNORMAL HIGH (ref 11.5–15.5)
RDW: 17.5 % — ABNORMAL HIGH (ref 11.5–15.5)
WBC: 11.5 10*3/uL — ABNORMAL HIGH (ref 4.0–10.5)
WBC: 15 10*3/uL — ABNORMAL HIGH (ref 4.0–10.5)
nRBC: 0.2 % (ref 0.0–0.2)
nRBC: 0.3 % — ABNORMAL HIGH (ref 0.0–0.2)

## 2020-03-21 LAB — BASIC METABOLIC PANEL
Anion gap: 11 (ref 5–15)
Anion gap: 11 (ref 5–15)
BUN: 37 mg/dL — ABNORMAL HIGH (ref 6–20)
BUN: 32 mg/dL — ABNORMAL HIGH (ref 6–20)
CO2: 32 mmol/L (ref 22–32)
CO2: 33 mmol/L — ABNORMAL HIGH (ref 22–32)
Calcium: 7.6 mg/dL — ABNORMAL LOW (ref 8.9–10.3)
Calcium: 7.8 mg/dL — ABNORMAL LOW (ref 8.9–10.3)
Chloride: 93 mmol/L — ABNORMAL LOW (ref 98–111)
Chloride: 91 mmol/L — ABNORMAL LOW (ref 98–111)
Creatinine, Ser: 0.97 mg/dL (ref 0.61–1.24)
Creatinine, Ser: 0.94 mg/dL (ref 0.61–1.24)
GFR, Estimated: >60 mL/min (ref >60)
GFR, Estimated: >60 mL/min (ref >60)
Glucose, Bld: 133 mg/dL — ABNORMAL HIGH (ref 70–99)
Glucose, Bld: 106 mg/dL — ABNORMAL HIGH (ref 70–99)
Potassium: 3.7 mmol/L (ref 3.5–5.1)
Potassium: 5.1 mmol/L (ref 3.5–5.1)
Sodium: 136 mmol/L (ref 135–145)
Sodium: 135 mmol/L (ref 135–145)

## 2020-03-21 LAB — HEPATIC FUNCTION PANEL
ALT: 31 U/L (ref 0–44)
AST: 46 U/L — ABNORMAL HIGH (ref 15–41)
Albumin: 3.2 g/dL — ABNORMAL LOW (ref 3.5–5.0)
Alkaline Phosphatase: 85 U/L (ref 38–126)
Bilirubin, Direct: 0.2 mg/dL (ref 0.0–0.2)
Indirect Bilirubin: 1.1 mg/dL — ABNORMAL HIGH (ref 0.3–0.9)
Total Bilirubin: 1.3 mg/dL — ABNORMAL HIGH (ref 0.3–1.2)
Total Protein: 4.9 g/dL — ABNORMAL LOW (ref 6.5–8.1)

## 2020-03-21 LAB — GLUCOSE, CAPILLARY
Glucose-Capillary: 131 mg/dL — ABNORMAL HIGH (ref 70–99)
Glucose-Capillary: 110 mg/dL — ABNORMAL HIGH (ref 70–99)
Glucose-Capillary: 129 mg/dL — ABNORMAL HIGH (ref 70–99)
Glucose-Capillary: 188 mg/dL — ABNORMAL HIGH (ref 70–99)
Glucose-Capillary: 176 mg/dL — ABNORMAL HIGH (ref 70–99)
Glucose-Capillary: 95 mg/dL (ref 70–99)
Glucose-Capillary: 95 mg/dL (ref 70–99)
Glucose-Capillary: 109 mg/dL — ABNORMAL HIGH (ref 70–99)
Glucose-Capillary: 46 mg/dL — ABNORMAL LOW (ref 70–99)
Glucose-Capillary: 51 mg/dL — ABNORMAL LOW (ref 70–99)
Glucose-Capillary: 174 mg/dL — ABNORMAL HIGH (ref 70–99)
Glucose-Capillary: 37 mg/dL — CL (ref 70–99)
Glucose-Capillary: 88 mg/dL (ref 70–99)

## 2020-03-21 LAB — PROTIME-INR
INR: 1.9 — ABNORMAL HIGH (ref 0.8–1.2)
Prothrombin Time: 20.8 seconds — ABNORMAL HIGH (ref 11.4–15.2)

## 2020-03-21 LAB — APTT
aPTT: 73 seconds — ABNORMAL HIGH (ref 24–36)
aPTT: 65 seconds — ABNORMAL HIGH (ref 24–36)

## 2020-03-21 LAB — MAGNESIUM: Magnesium: 1.9 mg/dL (ref 1.7–2.4)

## 2020-03-21 LAB — LACTIC ACID, PLASMA
Lactic Acid, Venous: 1.3 mmol/L (ref 0.5–1.9)
Lactic Acid, Venous: 1.3 mmol/L (ref 0.5–1.9)

## 2020-03-21 LAB — LACTATE DEHYDROGENASE: LDH: 893 U/L — ABNORMAL HIGH (ref 98–192)

## 2020-03-21 LAB — PREPARE RBC (CROSSMATCH)

## 2020-03-21 LAB — PHOSPHORUS: Phosphorus: 4.3 mg/dL (ref 2.5–4.6)

## 2020-03-21 LAB — FIBRINOGEN: Fibrinogen: 430 mg/dL (ref 210–475)

## 2020-03-21 MED ORDER — INSULIN ASPART 100 UNIT/ML ~~LOC~~ SOLN: 4.0000 [IU] | SUBCUTANEOUS | Status: DC

## 2020-03-21 MED ORDER — LIP MEDEX EX OINT
TOPICAL_OINTMENT | CUTANEOUS | Status: DC | PRN
  Filled 2020-03-21: qty 7

## 2020-03-21 MED ORDER — FREE WATER
200.0000 mL | Freq: Three times a day (TID) | Status: DC
  Administered 2020-03-21 (×2): 200 mL

## 2020-03-21 MED ORDER — POLYETHYLENE GLYCOL 3350 17 G PO PACK
17.0000 g | PACK | Freq: Two times a day (BID) | ORAL | Status: DC
  Administered 2020-03-21 – 2020-04-01 (×22): 17 g
  Filled 2020-03-21 (×23): qty 1

## 2020-03-21 MED ORDER — SENNOSIDES 8.8 MG/5ML PO SYRP
10.0000 mL | ORAL_SOLUTION | Freq: Two times a day (BID) | ORAL | Status: DC
  Administered 2020-03-21 – 2020-04-03 (×24): 10 mL
  Filled 2020-03-21 (×25): qty 10

## 2020-03-21 MED ORDER — QUETIAPINE FUMARATE 50 MG PO TABS
150.0000 mg | ORAL_TABLET | Freq: Three times a day (TID) | ORAL | Status: DC
  Administered 2020-03-21 – 2020-03-25 (×14): 150 mg
  Filled 2020-03-21 (×15): qty 1

## 2020-03-21 MED ORDER — SODIUM CHLORIDE 0.9% IV SOLUTION: Freq: Once | INTRAVENOUS | Status: AC

## 2020-03-21 MED ORDER — POTASSIUM CHLORIDE 10 MEQ/50ML IV SOLN
10.0000 meq | INTRAVENOUS | Status: AC
  Administered 2020-03-21 (×4): 10 meq via INTRAVENOUS
  Filled 2020-03-21 (×4): qty 50

## 2020-03-21 MED ORDER — CLONAZEPAM 1 MG PO TABS
1.5000 mg | ORAL_TABLET | Freq: Four times a day (QID) | ORAL | Status: DC
  Administered 2020-03-21 – 2020-03-26 (×19): 1.5 mg
  Filled 2020-03-21 (×20): qty 1

## 2020-03-21 MED ORDER — INSULIN ASPART 100 UNIT/ML ~~LOC~~ SOLN
3.0000 [IU] | SUBCUTANEOUS | Status: DC
  Administered 2020-03-22 (×2): 3 [IU] via SUBCUTANEOUS
  Administered 2020-03-23 (×3): 6 [IU] via SUBCUTANEOUS
  Administered 2020-03-24: 9 [IU] via SUBCUTANEOUS
  Administered 2020-03-24: 3 [IU] via SUBCUTANEOUS

## 2020-03-21 NOTE — Progress Notes (Signed)
ECMO PROGRESS NOTE  NAME:  Alex [name], MRN:  [id number], DOB:  March 28, 1970, LOS: 12 ADMISSION DATE:  03/08/2020, CONSULTATION DATE: 04-01-2020 REFERRING MD: Wynona Neat -LBPCCM, CHIEF COMPLAINT: Respiratory failure requiring ECMO  HPI/course in hospital  50 year old man admitted to hospital 12/28 with 1 week history of dyspnea cough nausea and vomiting.  Initially admitted to [facility] long hospital and placed on high flow nasal cannula but rapidly failed and required intubation 12/29.  Persistent hypoxic respiratory failure with PF ratio 55 in spite of 18 of PEEP FiO2 0.1 despite paralytics.  Did not improve with prone ventilation  Cannulated for VV ECMO 12/30 via right IJ crescent cannula. Iatrogenic pneumothorax from left subclavian triple-lumen placement  Family confirms no significant past medical history apart from possible prediabetes  Past Medical History  none  Interim history/subjective:  Severe agitation and anxiety yesterday that required heavy sedation due to work of breathing being uncontrolled. This morning denies pain, remains on precedex and fentanyl drips.  Objective   Blood pressure 114/68, pulse 81, temperature 98.24 F (36.8 C), resp. rate 13, height 5\' 4"  (1.626 m), weight 78.5 kg, SpO2 99 %.    Vent Mode: PCV FiO2 (%):  [40 %-100 %] 50 % Set Rate:  [10 bmp] 10 bmp PEEP:  [10 cmH20] 10 cmH20 Plateau Pressure:  [19 cmH20-34 cmH20] 24 cmH20   Intake/Output Summary (Last 24 hours) at 03/21/2020 0723 Last data filed at 03/21/2020 0700 Gross per 24 hour  Intake 6615.44 ml  Output 6765 ml  Net -149.56 ml   Filed Weights   03/19/20 0500 03/20/20 0645 03/21/20 0600  Weight: 80.1 kg 77.5 kg 78.5 kg    ECMO Device: Cardiohelp  ECMO Mode: VV  Flow (LPM): 3.94   Examination: General: critically ill appearing man laying in bed in NAD, on MV and ECMO HEENT: [place]/AT, eyes anicteric, oral mucosa moist. Cortrak left nare. Neck: RIJ ECMO cannula, trach w/o  bleeding. Cardio: S1S2, RRR Respiratory: breathing comfortably, tachypnea but controlled WOB. VT ~300cc Abdomen distended, soft and nontender, tympanitic to percussion Extremities: left hand with cyanotic digits x 5, covid purple toe left first toe, R radial Aline. Pitting edema. Derm : no rashes or wounds Neuro: RASS -2, follows commands, trying to communicate by talking    CXR personally reviewed- dense infiltrates, trach and ECMO cannnula in appropriate positions  Ancillary tests (personally reviewed)  CBC: Recent Labs  Lab 03/19/20 0322 03/19/20 0327 03/19/20 1545 03/19/20 1555 03/20/20 0408 03/20/20 0415 03/20/20 1650 03/20/20 1808 03/20/20 2005 03/21/20 0337 03/21/20 0346  WBC 12.4*  --  15.8*  --  12.2*  --  15.2*  --   --  11.5*  --   HGB 8.6*   < > 9.0*   < > 8.5*   < > 8.6* 7.8* 9.2* 7.7* 7.8*  HCT 27.0*   < > 27.1*   < > 26.5*   < > 26.5* 23.0* 27.0* 24.2* 23.0*  MCV 92.2  --  91.2  --  92.7  --  91.1  --   --  92.4  --   PLT 121*  --  125*  --  116*  --  117*  --   --  114*  --    < > = values in this interval not displayed.    Basic Metabolic Panel: Recent Labs  Lab 03/17/20 0349 03/17/20 0904 03/19/20 0322 03/19/20 0327 03/19/20 1545 03/19/20 1555 03/20/20 0408 03/20/20 0415 03/20/20 1650 03/20/20 1808 03/20/20 2005  03/21/20 0337 03/21/20 0346  NA 144  146*   < > 139   < > 142   < > 138   < > 136 135 133* 136 136  K 3.8  3.9   < > 4.4   < > 4.7   < > 4.0   < > 4.2 4.0 4.1 3.7 3.8  CL 106   < > 104  --  104  --  98  --  95*  --   --  93*  --   CO2 29   < > 26  --  28  --  31  --  32  --   --  32  --   GLUCOSE 156*   < > 255*  --  119*  --  143*  --  161*  --   --  133*  --   BUN 37*   < > 41*  --  36*  --  32*  --  36*  --   --  37*  --   CREATININE 0.84   < > 1.01  --  0.94  --  0.89  --  0.89  --   --  0.97  --   CALCIUM 7.3*   < > 7.3*  --  7.7*  --  7.5*  --  7.6*  --   --  7.6*  --   MG 2.8*  --   --   --   --   --   --   --   --   --   --    --   --   PHOS 2.4*  --   --   --   --   --   --   --   --   --   --   --   --    < > = values in this interval not displayed.   GFR: Estimated Creatinine Clearance: 89.1 mL/min (by C-G formula based on SCr of 0.97 mg/dL). Recent Labs  Lab 03/19/20 1545 03/20/20 0408 03/20/20 1650 03/21/20 0337  WBC 15.8* 12.2* 15.2* 11.5*  LATICACIDVEN 1.8 1.4 1.7 1.3    Liver Function Tests: Recent Labs  Lab 03/17/20 1224 03/18/20 0412 03/19/20 0322 03/20/20 0408 03/21/20 0337  AST 54* 44* 41 43* 46*  ALT 40 40 37 32 31  ALKPHOS 114 82 83 98 85  BILITOT 1.5* 1.3* 1.4* 1.2 1.3*  PROT 4.6* 4.2* 4.7* 4.7* 4.9*  ALBUMIN 3.0* 2.6* 2.9* 2.7* 3.2*   No results for input(s): LIPASE, AMYLASE in the last 168 hours. No results for input(s): AMMONIA in the last 168 hours.  ABG    Component Value Date/Time   PHART 7.442 03/21/2020 0346   PCO2ART 53.8 (H) 03/21/2020 0346   PO2ART 52 (L) 03/21/2020 0346   HCO3 36.8 (H) 03/21/2020 0346   TCO2 38 (H) 03/21/2020 0346   ACIDBASEDEF 1.0 03/17/2020 1608   O2SAT 88.0 03/21/2020 0346     Coagulation Profile: Recent Labs  Lab 03/17/20 1621 03/18/20 0412 03/19/20 0322 03/20/20 0408 03/21/20 0337  INR 1.7* 2.0* 1.9* 1.8* 1.9*    Cardiac Enzymes: No results for input(s): CKTOTAL, CKMB, CKMBINDEX, TROPONINI in the last 168 hours.  HbA1C: Hgb A1c MFr Bld  Date/Time Value Ref Range Status  10-26-2019 06:10 PM 11.8 (H) 4.8 - 5.6 % Final    Comment:    (NOTE) Pre diabetes:          5.7%-6.4%  Diabetes:              >6.4%  Glycemic control for   <7.0% adults with diabetes   03/06/2020 08:30 AM 12.2 (H) 4.8 - 5.6 % Final    Comment:    (NOTE) Pre diabetes:          5.7%-6.4%  Diabetes:              >6.4%  Glycemic control for   <7.0% adults with diabetes     CBG: Recent Labs  Lab 03/20/20 2002 03/20/20 2129 03/21/20 0020 03/21/20 0136 03/21/20 0343  GLUCAP 164* 162* 131* 110* 129*     Assessment & Plan:   Acute  hypoxic and hypercarbic respiratory failure requiring VV ECMO support and mechanical ventilation. ARDS due to COVID-19 viral pneumonia Pneumothorax on left Bilateral DVT , cardiac clot in transit. Small PE but hemodynamically stable. Concern for RLL pneumonia -Con't ultralung protective ventilation. Ok to start PMV trials on vent with SLP. WOB likely too uncontrolled to start TCTs. -Con't full ECMO support, weaning as tolerated. -Con't lasix infusion  -Con't chest tube to suction -Routine trach care -Complete courses of remdesivir, corticosteroids, Actemra. -Completed empiric courses of antibiotics for community-acquired pneumonia. Continue empiric antibiotics for resistant organisms- cefepime & vanc. -Will need 3 months of anticoagulation for provoked thromboembolic disease. -increasing seroquel and klonopin to help dyspnea and anxiety to work on coming off infusions. Con't precedex and fentanyl gtt PRN. Con't enteral oxycodone. -con't steroid taper, 40mg  x 3 days, then decrease by 10mg  Q3 days. Monitor for worsening hypoxia.  Agitation-improved now back on mechanical ventilation. -Continue Precedex infusion; limit as possible. Adding clonidine 0.2 Q8h -Con't Ativan 0.5 every 4 hours as needed -Seroquel 150 mg> now TID -Con't oxycodone to 15 mg q4h + PRN. Fentanyl PRN for breakthrough. -increase Klonopin to 1.5 mg Q6h -Haldol as needed  Hyperglycemia; uncontrolled DM PTA (A1c 12.2) -Continue insulin infusion; con't Levemir to 50 units twice daily -goal BG 140-180 -decreasing steroids today  Acute anemia; suspect hemolytic. LDH improving. Thrombocytopenia; remains stable -Transfuse for hemoglobin less than 8 or hemodynamically significant bleeding. 1 unit pRBCs overnight. -Continue to monitor platelets  Leukocytosis-Concern for right lower lobe pneumonia, likely bacteremia -blood cx 1/4 pending- CONS in 3/4 bottles -Con't empiric antibiotics vanc, cefepime -Continue to follow  trach aspirate until finalized  Complete heart block; associated with hypoxia due to cannula positioning issues -Resolved -Continue to hold metoprolol  FEN -Continue tube feeds at goal -increase bowel regimen  Deconditioning -needs PT, OT, SLP  Daily Goals Checklist  Pain/Anxiety/Delirium protocol (if indicated): enteral oxycodone and clonazepam, precedex, seroquel, clonidine VAP protocol (if indicated): extubated DVT prophylaxis: Systemic bivalirudin Nutritional status and feeding goals: High nutritional risk, tube feeds via core track GI prophylaxis: Pantoprazole Urinary catheter: Assessment of intravascular volume Central lines: Right IJ crescent, left subclavian triple-lumen, right radial arterial line Glucose control:  insulin gtt, levemir Mobility/therapy needs: progressive mobility Code Status: Full Family Communication: will update son this afternoon Last GoC meeting 03/18/19 with brother, son, friend> con't aggressive care  Multidisciplinary ECMO rounds with cardiology, pharmacy, RNs, ECMO specialists, and PCCM.  This patient is critically ill with multiple organ system failure which requires frequent high complexity decision making, assessment, support, evaluation, and titration of therapies. This was completed through the application of advanced monitoring technologies and extensive interpretation of multiple databases. During this encounter critical care time was devoted to patient care services described in this note for 51 minutes.   Kathleena Freeman, DO 03/21/20 8:20 AM [facility]

## 2020-03-21 NOTE — Progress Notes (Signed)
ANTICOAGULATION CONSULT NOTE  Pharmacy Consult for Bivalirudin Indication: ECMO + DVTs  No Known Allergies  Patient Measurements: Height: 5\' 4"  (162.6 cm) Weight: 78.5 kg (173 lb 1 oz) IBW/kg (Calculated) : 59.2 Heparin Dosing Weight: 72kg  Vital Signs: Temp: 98.24 F (36.8 C) (01/09 0700) BP: 114/68 (01/09 0700) Pulse Rate: 81 (01/09 0700)  Labs: Recent Labs    03/19/20 0322 03/19/20 0327 03/20/20 0408 03/20/20 0415 03/20/20 1650 03/20/20 1808 03/20/20 2005 03/21/20 0337 03/21/20 0346  HGB 8.6*   < > 8.5*   < > 8.6*   < > 9.2* 7.7* 7.8*  HCT 27.0*   < > 26.5*   < > 26.5*   < > 27.0* 24.2* 23.0*  PLT 121*   < > 116*  --  117*  --   --  114*  --   APTT 61*   < > 59*  --  63*  --   --  73*  --   LABPROT 21.2*  --  20.0*  --   --   --   --  20.8*  --   INR 1.9*  --  1.8*  --   --   --   --  1.9*  --   CREATININE 1.01   < > 0.89  --  0.89  --   --  0.97  --    < > = values in this interval not displayed.    Estimated Creatinine Clearance: 89.1 mL/min (by C-G formula based on SCr of 0.97 mg/dL).   Medical History: History reviewed. No pertinent past medical history.   Assessment: 5 yoM admitted with COVID-19 PNA with worsening hypoxia, s/p cannulation for ECMO. Pt was started on IV heparin prior to cannulation due to acute DVTs and possible PE,  transitioned to bivalirudin with ECMO. Now s/p tPA on 1/5 and tracheostomy on 1/6.  APTT therapeutic at 73 seconds on bivalirudin drip 0.25mg /kg/hr. After discussion with Dr. 3/6 and Dr. Chestine Spore, aPTT goal was increased to 60-80 seconds since no bleeding since trach and + VTE.  Hgb down 7.7, to get 1u PRBC. Platelets stable 114. There seem to be some clots near the top of the oxygenator, will monitor closely. LDH down from 1009 to 893, fibrinogen up 430 this morning.  Goal of Therapy:  APTT 60-80 seconds - new goal 1/8 Monitor platelets by anticoagulation protocol: Yes   Plan:  -Continue bivalirudin to 0.25 mg/kg/hr   -q12h CBC, aptt  Shirlee Latch, PharmD, BCPS PGY2 Cardiology Pharmacy Resident Phone: [phone] 03/21/2020  7:12 AM  Please check [email or web address] for unit-specific pharmacy phone numbers.

## 2020-03-21 NOTE — Progress Notes (Addendum)
   Palliative Medicine Inpatient Follow Up Note  HPI: 50 y.o. male  with no significant past medical history admitted on 02/13/2020 with dyspnea, cough, nausea/vomiting ~ 1 week ago with worsening symptoms of body aches and fatigue and +COVID 02/07/20 and admitted with shortness of breath. Required intubation 03/10/20. Cannulated for VV ECMO 02/27/2020. Oxygenating better with ECMO. Also evidence of RLE DVT, RV thrombus, and high suspicion of PE. Has required chest tube to L lung for collapse which needs further reposition with recurrent collapse. Trach placed 03-18-20  Today's Discussion (03/21/2020): Chart reviewed.   Attended ECMO round this morning. S/P tracheostomy on 1/6. Tylenol ATC for pain from recent trach placement. Started on a lasix gtt on 1/7 for better volume management. Remains on prednisone. Insulin gtt and long acting continue to be modified by pharmacy. Abd distention improving. Agitation is stable at this time on current measures. L pneumothorax improved s/p chest tube w/ re-expansion - this will remain in place while on ventilator. Nursing staff maintaining vigilance with L hand given some cyanotic changes. Hgb 7.7 - getting 1 unit of blood. Na levels stable therefore pharmacy will reduce FWF frequency. With positional changes tends to have an increase in anxiety likely in the setting of cannula size. Plan to have continued PT/OT to optimize mobility. Plan to have speech start working with patient so that he may be able to communicate more thoroughly possibly with a passy muir valve.  Upon patient assessment he was alert, denied pain, anxiety. Offered to have someone visit today which he was amenable to - he requested his son, Alex [name].  Josh called this morning. He will be coming in to spend time with his father. Provided a brief phone update. Josh also provided consent for the nursing staff to start decorating Alex [name]'s room with his teams artwork.   Discussed the importance of continued  conversation with his support people and the  medical providers regarding overall plan of care and treatment options, ensuring decisions are within the context of the patients values and GOCs.  SUMMARY OF RECOMMENDATIONS   Full Code / Full Scope of treatment  ECMO as a bridge to recovery  Ongoing Palliative care support  Spiritual Support - Christian   Time Spent: 25 Greater than 50% of the time was spent in counseling and coordination of care ______________________________________________________________________________________ Lamarr Lulas NP [facility] Palliative Medicine Team Team Cell Phone: [phone] Please utilize secure chat with additional questions, if there is no response within 30 minutes please call the above phone number  Palliative Medicine Team providers are available by phone from 7am to 7pm daily and can be reached through the team cell phone.  Should this patient require assistance outside of these hours, please call the patient's attending physician.

## 2020-03-21 NOTE — Progress Notes (Signed)
ANTICOAGULATION CONSULT NOTE  Pharmacy Consult for Bivalirudin Indication: ECMO + DVTs  No Known Allergies  Patient Measurements: Height: 5\' 4"  (162.6 cm) Weight: 78.5 kg (173 lb 1 oz) IBW/kg (Calculated) : 59.2 Heparin Dosing Weight: 72kg  Vital Signs: Temp: 98.4 F (36.9 C) (01/09 1700) Temp Source: Core (01/09 1600) BP: 171/129 (01/09 1700) Pulse Rate: 138 (01/09 1700)  Labs: Recent Labs    03/19/20 0322 03/19/20 0327 03/20/20 0408 03/20/20 0415 03/20/20 1650 03/20/20 1808 03/21/20 0337 03/21/20 0346 03/21/20 1624 03/21/20 1634  HGB 8.6*   < > 8.5*   < > 8.6*   < > 7.7* 7.8* 10.0* 10.2*  HCT 27.0*   < > 26.5*   < > 26.5*   < > 24.2* 23.0* 29.5* 30.0*  PLT 121*   < > 116*  --  117*  --  114*  --  130*  --   APTT 61*   < > 59*  --  63*  --  73*  --  65*  --   LABPROT 21.2*  --  20.0*  --   --   --  20.8*  --   --   --   INR 1.9*  --  1.8*  --   --   --  1.9*  --   --   --   CREATININE 1.01   < > 0.89  --  0.89  --  0.97  --  0.94  --    < > = values in this interval not displayed.    Estimated Creatinine Clearance: 91.9 mL/min (by C-G formula based on SCr of 0.94 mg/dL).   Medical History: History reviewed. No pertinent past medical history.   Assessment: 55 yoM admitted with COVID-19 PNA with worsening hypoxia, s/p cannulation for ECMO. Pt was started on IV heparin prior to cannulation due to acute DVTs and possible PE,  transitioned to bivalirudin with ECMO. Now s/p tPA on 1/5 and tracheostomy on 1/6.  After discussion this morning with Dr. 3/6 and Dr. Chestine Spore, aPTT goal was increased to 60-80 seconds since no bleeding since trach and + VTE.  Hgb 10.2 likely inaccurate this afternoon or just seeing effects of transfusion. Platelets stable 130. There seem to be some clots near the top of the oxygenator, will monitor closely. LDH down from 1009 to 893, fibrinogen up 430 this morning.  Aptt this afternoon is at goal at 65s, given new goal will make small rate  increase.  Goal of Therapy:  APTT 60-80 seconds - new goal 1/8 Monitor platelets by anticoagulation protocol: Yes   Plan:  -Increase bivalirudin to 0.28 mg/kg/hr to keep within goal -q12h CBC, aptt  Shirlee Latch PharmD., BCPS Clinical Pharmacist 03/21/2020 5:24 PM

## 2020-03-21 NOTE — Procedures (Signed)
Extracorporeal support note  ECLS cannulation date: 12/30 ECLS support day: 11 Last circuit change: n/a  Indication: Severe respiratory failure secondary to COVID-19 pneumonia with RV dysfunction  Configuration: Venovenous  Drainage cannula: 32 French crescent cannula via right IJ Return cannula: Same  Pump speed: 3000 RPM Pump flow: 3.8 L/min Pump used: Cardio help  Oxygenator: Cardio help O2 blender: 100% Sweep gas: 4L  Circuit check: clot at corners, especially the left- stable Anticoagulant: Bivalirudin Anticoagulation targets: PTT 60-80  Changes in support: Con't current ECMO support. Remains on MV, but struggles with severe anxiety causing uncontrolled WOB. Increasing oral anxiety meds to decrease infusions. Con't antibiotics. He is ready for PT, OT, SLP and is motivated to start.  Anticipated goals/duration of support: Bridge to recovery.  Multidisciplinary ECMO rounds completed.   Steffanie Dunn, DO 03/21/20 8:29 AM [facility]

## 2020-03-21 NOTE — Progress Notes (Signed)
Patient ID: Alex [name], male   DOB: 03-04-1971, 50 y.o.   MRN: [id number]     Advanced Heart Failure Rounding Note  PCP-Cardiologist: No primary care provider on file.   Subjective:    - 12/30: VV ECMO cannulation - 12/31: Left chest tube replaced - 1/2: Extubated. Echo with EF 60-65%, mildly dilated RV with mildly decreased systolic function.  - 1/4: Agitated, suspected aspiration.  Re-intubated.  - 1/5: ECMO cannula repositioned under TEE guidance. TEE showed moderately dilated/moderate-severely dysfunctional RV in setting of hypoxemia. LUE DVT found.  Patient got 1/2 dose of TPA due to initial concern for large PE.  - 1/6: Tracheostomy - 1/7: Echo with mild RV dilation/mild RV dysfunction.   Good diuresis with Lasix gtt 8 mg/hr, I/Os mildly negative.     CXR mildly worse, concern for negative pressure pulmonary edema when he was awake yesterday with increased work of breathing and anxiety.  Now sedated and improved (oxygen saturation up to 97%).   Bivalirudin gtt PTT 73.   On vancomycin/cefepime.   ECMO parameters: 3000 rpm Flow 3.9 L/min Pvenous -50 Delta P 20 Sweep 4 ABG 7.44/54/52/88% LDH 410 => 543 => 728 => 1041 => 998 => 1305 => 934 => 959 => 1009 => 893 Lactate 1.3  Objective:   Weight Range: 78.5 kg Body mass index is 29.71 kg/m.   Vital Signs:   Temp:  [97.88 F (36.6 C)-99.1 F (37.3 C)] 98.24 F (36.8 C) (01/09 0730) Pulse Rate:  [74-139] 81 (01/09 0730) Resp:  [7-53] 20 (01/09 0730) BP: (91-190)/(50-172) 108/72 (01/09 0730) SpO2:  [83 %-100 %] 98 % (01/09 0730) Arterial Line BP: (110-280)/(42-108) 118/62 (01/09 0730) FiO2 (%):  [40 %-100 %] 50 % (01/09 0500) Weight:  [78.5 kg] 78.5 kg (01/09 0600) Last BM Date: 03/20/20  Weight change: Filed Weights   03/19/20 0500 03/20/20 0645 03/21/20 0600  Weight: 80.1 kg 77.5 kg 78.5 kg    Intake/Output:   Intake/Output Summary (Last 24 hours) at 03/21/2020 0747 Last data filed at 03/21/2020  0700 Gross per 24 hour  Intake 6680.44 ml  Output 6765 ml  Net -84.56 ml      Physical Exam    General: NAD,sedated on vent Neck: Tracheostomy. No JVD, no thyromegaly or thyroid nodule.  Lungs: Decreased bilaterally.  CV: Nondisplaced PMI.  Heart regular S1/S2, no S3/S4, no murmur.  No peripheral edema.   Abdomen: Soft, nontender, no hepatosplenomegaly, no distention.  Skin: Intact without lesions or rashes.  Neurologic: Will wake and follow commands. Extremities: No clubbing or cyanosis.  HEENT: Normal.   Telemetry   NSR 90s (personally reviewed)  Labs    CBC Recent Labs    03/20/20 1650 03/20/20 1808 03/21/20 0337 03/21/20 0346  WBC 15.2*  --  11.5*  --   HGB 8.6*   < > 7.7* 7.8*  HCT 26.5*   < > 24.2* 23.0*  MCV 91.1  --  92.4  --   PLT 117*  --  114*  --    < > = values in this interval not displayed.   Basic Metabolic Panel Recent Labs    03/20/20 1650 03/20/20 1808 03/21/20 0337 03/21/20 0346  NA 136   < > 136 136  K 4.2   < > 3.7 3.8  CL 95*  --  93*  --   CO2 32  --  32  --   GLUCOSE 161*  --  133*  --   BUN 36*  --  37*  --   CREATININE 0.89  --  0.97  --   CALCIUM 7.6*  --  7.6*  --    < > = values in this interval not displayed.   Liver Function Tests Recent Labs    03/20/20 0408 03/21/20 0337  AST 43* 46*  ALT 32 31  ALKPHOS 98 85  BILITOT 1.2 1.3*  PROT 4.7* 4.9*  ALBUMIN 2.7* 3.2*   No results for input(s): LIPASE, AMYLASE in the last 72 hours. Cardiac Enzymes No results for input(s): CKTOTAL, CKMB, CKMBINDEX, TROPONINI in the last 72 hours.  BNP: BNP (last 3 results) No results for input(s): BNP in the last 8760 hours.  ProBNP (last 3 results) No results for input(s): PROBNP in the last 8760 hours.   D-Dimer No results for input(s): DDIMER in the last 72 hours. Hemoglobin A1C No results for input(s): HGBA1C in the last 72 hours. Fasting Lipid Panel No results for input(s): CHOL, HDL, LDLCALC, TRIG, CHOLHDL, LDLDIRECT  in the last 72 hours. Thyroid Function Tests No results for input(s): TSH, T4TOTAL, T3FREE, THYROIDAB in the last 72 hours.  Invalid input(s): FREET3  Other results:   Imaging    No results found.   Medications:     Scheduled Medications: . sodium chloride   Intravenous Once  . chlorhexidine gluconate (MEDLINE KIT)  15 mL Mouth Rinse BID  . Chlorhexidine Gluconate Cloth  6 each Topical Daily  . clonazePAM  1 mg Per Tube Q8H  . cloNIDine  0.2 mg Oral Q8H  . docusate  100 mg Per Tube BID  . free water  200 mL Per Tube Q8H  . guaiFENesin-dextromethorphan  10 mL Per Tube BID  . insulin detemir  50 Units Subcutaneous BID  . living well with diabetes book   Does not apply Once  . mouth rinse  15 mL Mouth Rinse 10 times per day  . midazolam  5 mg Intravenous Once  . oxyCODONE  15 mg Per Tube Q6H  . pantoprazole sodium  40 mg Per Tube QHS  . polyethylene glycol  17 g Per Tube Daily  . [START ON 03/23/2020] predniSONE  30 mg Per Tube Q breakfast   Followed by  . [START ON 03/26/2020] predniSONE  20 mg Per Tube Q breakfast   Followed by  . [START ON 03/29/2020] predniSONE  10 mg Per Tube Q breakfast  . predniSONE  40 mg Per Tube Daily  . propofol  500 mg Intravenous Once  . QUEtiapine  150 mg Per Tube BID  . sennosides  10 mL Per Tube Daily  . sodium chloride flush  10-40 mL Intracatheter Q12H    Infusions: . sodium chloride    . sodium chloride 10 mL/hr at 03/21/20 0700  . sodium chloride 200 mL/hr at 03/21/20 0700  . sodium chloride Stopped (03/12/20 0131)  . albumin human 12.5 g (03/20/20 2136)  . bivalirudin (ANGIOMAX) infusion 0.5 mg/mL (Non-ACS indications) 0.25 mg/kg/hr (03/21/20 0700)  . ceFEPime (MAXIPIME) IV 2 g (03/21/20 0005)  . dexmedetomidine (PRECEDEX) IV infusion 1.3 mcg/kg/hr (03/21/20 0700)  . feeding supplement (PIVOT 1.5 CAL) 65 mL/hr at 03/21/20 0700  . fentaNYL infusion INTRAVENOUS 27.5 mcg/hr (03/21/20 0700)  . furosemide (LASIX) 200 mg in dextrose  5% 100 mL (20m/mL) infusion 8 mg/hr (03/21/20 0700)  . insulin 5 mL/hr at 03/21/20 0700  . norepinephrine (LEVOPHED) Adult infusion Stopped (03/17/20 2127)  . potassium chloride    . vancomycin Stopped (03/21/20 0555)  PRN Medications: Place/Maintain arterial line **AND** sodium chloride, sodium chloride, sodium chloride, acetaminophen (TYLENOL) oral liquid 160 mg/5 mL, acetaminophen, albumin human, chlorpheniramine-HYDROcodone, dextrose, fentaNYL (SUBLIMAZE) injection, guaiFENesin, haloperidol lactate, hydrALAZINE, labetalol, LORazepam, ondansetron (ZOFRAN) IV, oxyCODONE, sodium chloride flush   Assessment/Plan   1. Acute hypoxemic respiratory failure: Due to COVID-19 PNA with bilateral infiltrates.  Refractory hypoxemia, VV-ECMO cannulation on 02/23/2020 with improvement in oxygenation.  Developed left PTX post-subclavian CVL and has left chest tube, the left lung is re-expanded.  He was extubated 1/2 but reintubated 1/4 with agitation and suspected aspiration.  Tracheostomy 1/6.  CXR with bilateral infiltrates, unchanged.  He is now on vancomycin/cefepime for empiric abx coverage.  ECMO cannula repositioned 1/5.  LDH elevated but lower today.  - ECMO circuit functioning appropriately.  - Continue bivalirudin, goal PTT 60-80.  - Patient has had remdesivir, tocilizumab. - Ongoing steroids with prednisone.  - Continue vancomycin/cefepime x 7 days.    - Conitnue Lasix gtt 8 mg/hr today, aim for gentle negative I/Os.  2. RLE DVT/LUE DVT/thrombus in RV/suspect PE: Echo with moderately dilated and moderately dysfunctional RV.  Clot noted on TEE in RV as well.  TTE 1/2 showed normal EF 60-65%, RV improved (mildly dilated/dysfunctional). TEE on 1/5 with moderate to severe RV dysfunction but patient was hypoxemic.  Had 1/2 dose TPA on 1/5. Echo 1/7 with mildly dilated/mildly dysfunctional RV.  - Bivalirudin for goal PTT 60-80.   3. Left PTX: Left chest tube, lung is re-expanded.   4. Shock: Suspect  septic/distributive.  Now resolved, off NE.  5. Anemia: Hgb 7.7, transfuse < 8.   - 1 unit PRBCs today.  6. AKI: Resolved.  7. Hyperglycemia: Insulin gtt today.  8. HTN: Following cuff pressure (whip in ABG). 9. CHB: Episode of CHB when hypoxemic.  Now off metoprolol.  NSR today.  10. Thrombocytopenia: Mild, follow.  11. FEN: TFs.   CRITICAL CARE Performed by: Loralie Champagne  Total critical care time: 40 minutes  Critical care time was exclusive of separately billable procedures and treating other patients.  Critical care was necessary to treat or prevent imminent or life-threatening deterioration.  Critical care was time spent personally by me on the following activities: development of treatment plan with patient and/or surrogate as well as nursing, discussions with consultants, evaluation of patient's response to treatment, examination of patient, obtaining history from patient or surrogate, ordering and performing treatments and interventions, ordering and review of laboratory studies, ordering and review of radiographic studies, pulse oximetry and re-evaluation of patient's condition.    Length of Stay: 9  Loralie Champagne, MD  03/21/2020, 7:47 AM  Advanced Heart Failure Team Pager [phone] (M-F; 7a - 4p)  Please contact [facility] for night-coverage after hours (4p -7a ) and weekends on [email or web address]

## 2020-03-21 NOTE — Plan of Care (Signed)
  Problem: Coping: Goal: Psychosocial and spiritual needs will be supported Outcome: Progressing   Problem: Respiratory: Goal: Will maintain a patent airway 03/21/2020 1802 by Allene Pyo, RN Outcome: Progressing 03/21/2020 1759 by Allene Pyo, RN Outcome: Progressing   Problem: Role Relationship: Goal: Method of communication will improve Outcome: Progressing   Problem: Pain Managment: Goal: General experience of comfort will improve Outcome: Progressing   Problem: Coping: Goal: Level of anxiety will decrease Outcome: Not Progressing Note: Pt with continued anxiety and onset of delirium. When pt awake HR in 130s, RR 30s-40s, and BP with drastic increase. Pt usually will calm with time and with boluses.

## 2020-03-21 NOTE — Progress Notes (Signed)
Pharmacy Antibiotic Note  Alex [name] is a 50 y.o. male admitted on 03/05/2020 with sepsis.  Pharmacy has been consulted for vancomycin and cefepime dosing. CXR slightly worse today than yesterday. WBC elevated but down at 11.5. Afebrile. Renal function stable. Making good UOP on IV Lasix infusion.  Plan: Cefepime 2g IV every 8 hours  Continue vancomycin 1000mg  IV q12h  Height: 5\' 4"  (162.6 cm) Weight: 78.5 kg (173 lb 1 oz) IBW/kg (Calculated) : 59.2  Temp (24hrs), Avg:98.3 F (36.8 C), Min:97.88 F (36.6 C), Max:99.1 F (37.3 C)  Recent Labs  Lab 03/18/20 0609 03/18/20 1538 03/18/20 1626 03/19/20 0322 03/19/20 1545 03/20/20 0408 03/20/20 1650 03/21/20 0337  WBC  --   --    < > 12.4* 15.8* 12.2* 15.2* 11.5*  CREATININE  --   --    < > 1.01 0.94 0.89 0.89 0.97  LATICACIDVEN  --   --    < > 2.4* 1.8 1.4 1.7 1.3  VANCOTROUGH 30* 13*  --   --   --   --   --   --    < > = values in this interval not displayed.    Estimated Creatinine Clearance: 89.1 mL/min (by C-G formula based on SCr of 0.97 mg/dL).    No Known Allergies  Antimicrobials this admission: Ceftriaxone 12/28 >> 1/1 Azithromycin 12/28 >> 1/1 Vancomycin 1/3 >> Cefepime 1/3 >>  Microbiology results: 1/6 TA: normal flora 1/3 BCx: staph lugdunensis (S: vanc), staph epidermidis, staph hominis 1/3 UCx: negF 12/28 BCx: propionibacterium acnes 12/28 MRSA PCR: neg   1/29, PharmD, BCPS PGY2 Cardiology Pharmacy Resident Phone: [phone] 03/21/2020  7:55 AM  Please check [email or web address] for unit-specific pharmacy phone numbers.

## 2020-03-22 ENCOUNTER — Inpatient Hospital Stay (HOSPITAL_COMMUNITY): Payer: Medicaid Other

## 2020-03-22 LAB — POCT I-STAT 7, (LYTES, BLD GAS, ICA,H+H)
Acid-Base Excess: 13 mmol/L — ABNORMAL HIGH (ref 0.0–2.0)
Acid-Base Excess: 13 mmol/L — ABNORMAL HIGH (ref 0.0–2.0)
Acid-Base Excess: 15 mmol/L — ABNORMAL HIGH (ref 0.0–2.0)
Acid-Base Excess: 15 mmol/L — ABNORMAL HIGH (ref 0.0–2.0)
Acid-Base Excess: 16 mmol/L — ABNORMAL HIGH (ref 0.0–2.0)
Acid-Base Excess: 17 mmol/L — ABNORMAL HIGH (ref 0.0–2.0)
Acid-Base Excess: 18 mmol/L — ABNORMAL HIGH (ref 0.0–2.0)
Bicarbonate: 39.5 mmol/L — ABNORMAL HIGH (ref 20.0–28.0)
Bicarbonate: 39.9 mmol/L — ABNORMAL HIGH (ref 20.0–28.0)
Bicarbonate: 41.6 mmol/L — ABNORMAL HIGH (ref 20.0–28.0)
Bicarbonate: 41.6 mmol/L — ABNORMAL HIGH (ref 20.0–28.0)
Bicarbonate: 42 mmol/L — ABNORMAL HIGH (ref 20.0–28.0)
Bicarbonate: 42.1 mmol/L — ABNORMAL HIGH (ref 20.0–28.0)
Bicarbonate: 42.4 mmol/L — ABNORMAL HIGH (ref 20.0–28.0)
Calcium, Ion: 1.03 mmol/L — ABNORMAL LOW (ref 1.15–1.40)
Calcium, Ion: 1.04 mmol/L — ABNORMAL LOW (ref 1.15–1.40)
Calcium, Ion: 1.07 mmol/L — ABNORMAL LOW (ref 1.15–1.40)
Calcium, Ion: 1.07 mmol/L — ABNORMAL LOW (ref 1.15–1.40)
Calcium, Ion: 1.07 mmol/L — ABNORMAL LOW (ref 1.15–1.40)
Calcium, Ion: 1.08 mmol/L — ABNORMAL LOW (ref 1.15–1.40)
Calcium, Ion: 1.08 mmol/L — ABNORMAL LOW (ref 1.15–1.40)
HCT: 26 % — ABNORMAL LOW (ref 39.0–52.0)
HCT: 26 % — ABNORMAL LOW (ref 39.0–52.0)
HCT: 26 % — ABNORMAL LOW (ref 39.0–52.0)
HCT: 27 % — ABNORMAL LOW (ref 39.0–52.0)
HCT: 30 % — ABNORMAL LOW (ref 39.0–52.0)
HCT: 31 % — ABNORMAL LOW (ref 39.0–52.0)
HCT: 32 % — ABNORMAL LOW (ref 39.0–52.0)
Hemoglobin: 10.2 g/dL — ABNORMAL LOW (ref 13.0–17.0)
Hemoglobin: 10.5 g/dL — ABNORMAL LOW (ref 13.0–17.0)
Hemoglobin: 10.9 g/dL — ABNORMAL LOW (ref 13.0–17.0)
Hemoglobin: 8.8 g/dL — ABNORMAL LOW (ref 13.0–17.0)
Hemoglobin: 8.8 g/dL — ABNORMAL LOW (ref 13.0–17.0)
Hemoglobin: 8.8 g/dL — ABNORMAL LOW (ref 13.0–17.0)
Hemoglobin: 9.2 g/dL — ABNORMAL LOW (ref 13.0–17.0)
O2 Saturation: 100 %
O2 Saturation: 100 %
O2 Saturation: 92 %
O2 Saturation: 97 %
O2 Saturation: 98 %
O2 Saturation: 99 %
O2 Saturation: 99 %
Patient temperature: 36.6
Patient temperature: 36.6
Patient temperature: 36.7
Patient temperature: 36.8
Patient temperature: 36.8
Patient temperature: 37
Patient temperature: 99
Potassium: 3.6 mmol/L (ref 3.5–5.1)
Potassium: 3.8 mmol/L (ref 3.5–5.1)
Potassium: 3.9 mmol/L (ref 3.5–5.1)
Potassium: 4.2 mmol/L (ref 3.5–5.1)
Potassium: 4.6 mmol/L (ref 3.5–5.1)
Potassium: 4.7 mmol/L (ref 3.5–5.1)
Potassium: 4.7 mmol/L (ref 3.5–5.1)
Sodium: 131 mmol/L — ABNORMAL LOW (ref 135–145)
Sodium: 131 mmol/L — ABNORMAL LOW (ref 135–145)
Sodium: 132 mmol/L — ABNORMAL LOW (ref 135–145)
Sodium: 132 mmol/L — ABNORMAL LOW (ref 135–145)
Sodium: 133 mmol/L — ABNORMAL LOW (ref 135–145)
Sodium: 133 mmol/L — ABNORMAL LOW (ref 135–145)
Sodium: 133 mmol/L — ABNORMAL LOW (ref 135–145)
TCO2: 41 mmol/L — ABNORMAL HIGH (ref 22–32)
TCO2: 42 mmol/L — ABNORMAL HIGH (ref 22–32)
TCO2: 43 mmol/L — ABNORMAL HIGH (ref 22–32)
TCO2: 43 mmol/L — ABNORMAL HIGH (ref 22–32)
TCO2: 44 mmol/L — ABNORMAL HIGH (ref 22–32)
TCO2: 44 mmol/L — ABNORMAL HIGH (ref 22–32)
TCO2: 44 mmol/L — ABNORMAL HIGH (ref 22–32)
pCO2 arterial: 44.3 mmHg (ref 32.0–48.0)
pCO2 arterial: 55.1 mmHg — ABNORMAL HIGH (ref 32.0–48.0)
pCO2 arterial: 60.3 mmHg — ABNORMAL HIGH (ref 32.0–48.0)
pCO2 arterial: 60.6 mmHg — ABNORMAL HIGH (ref 32.0–48.0)
pCO2 arterial: 61.5 mmHg — ABNORMAL HIGH (ref 32.0–48.0)
pCO2 arterial: 62.3 mmHg — ABNORMAL HIGH (ref 32.0–48.0)
pCO2 arterial: 63.7 mmHg — ABNORMAL HIGH (ref 32.0–48.0)
pH, Arterial: 7.42 (ref 7.350–7.450)
pH, Arterial: 7.422 (ref 7.350–7.450)
pH, Arterial: 7.427 (ref 7.350–7.450)
pH, Arterial: 7.435 (ref 7.350–7.450)
pH, Arterial: 7.446 (ref 7.350–7.450)
pH, Arterial: 7.493 — ABNORMAL HIGH (ref 7.350–7.450)
pH, Arterial: 7.58 — ABNORMAL HIGH (ref 7.350–7.450)
pO2, Arterial: 114 mmHg — ABNORMAL HIGH (ref 83.0–108.0)
pO2, Arterial: 139 mmHg — ABNORMAL HIGH (ref 83.0–108.0)
pO2, Arterial: 146 mmHg — ABNORMAL HIGH (ref 83.0–108.0)
pO2, Arterial: 175 mmHg — ABNORMAL HIGH (ref 83.0–108.0)
pO2, Arterial: 189 mmHg — ABNORMAL HIGH (ref 83.0–108.0)
pO2, Arterial: 65 mmHg — ABNORMAL LOW (ref 83.0–108.0)
pO2, Arterial: 99 mmHg (ref 83.0–108.0)

## 2020-03-22 LAB — CBC
HCT: 27.7 % — ABNORMAL LOW (ref 39.0–52.0)
HCT: 30.3 % — ABNORMAL LOW (ref 39.0–52.0)
Hemoglobin: 9.3 g/dL — ABNORMAL LOW (ref 13.0–17.0)
Hemoglobin: 9.8 g/dL — ABNORMAL LOW (ref 13.0–17.0)
MCH: 30.8 pg (ref 26.0–34.0)
MCH: 30 pg (ref 26.0–34.0)
MCHC: 33.6 g/dL (ref 30.0–36.0)
MCHC: 32.3 g/dL (ref 30.0–36.0)
MCV: 91.7 fL (ref 80.0–100.0)
MCV: 92.7 fL (ref 80.0–100.0)
Platelets: 121 10*3/uL — ABNORMAL LOW (ref 150–400)
Platelets: 138 10*3/uL — ABNORMAL LOW (ref 150–400)
RBC: 3.02 MIL/uL — ABNORMAL LOW (ref 4.22–5.81)
RBC: 3.27 MIL/uL — ABNORMAL LOW (ref 4.22–5.81)
RDW: 17.5 % — ABNORMAL HIGH (ref 11.5–15.5)
RDW: 17.3 % — ABNORMAL HIGH (ref 11.5–15.5)
WBC: 9.5 10*3/uL (ref 4.0–10.5)
WBC: 15.7 10*3/uL — ABNORMAL HIGH (ref 4.0–10.5)
nRBC: 0.2 % (ref 0.0–0.2)
nRBC: 0.1 % (ref 0.0–0.2)

## 2020-03-22 LAB — APTT
aPTT: 81 seconds — ABNORMAL HIGH (ref 24–36)
aPTT: 70 seconds — ABNORMAL HIGH (ref 24–36)

## 2020-03-22 LAB — BASIC METABOLIC PANEL
Anion gap: 10 (ref 5–15)
Anion gap: 11 (ref 5–15)
BUN: 30 mg/dL — ABNORMAL HIGH (ref 6–20)
BUN: 27 mg/dL — ABNORMAL HIGH (ref 6–20)
CO2: 36 mmol/L — ABNORMAL HIGH (ref 22–32)
CO2: 34 mmol/L — ABNORMAL HIGH (ref 22–32)
Calcium: 7.9 mg/dL — ABNORMAL LOW (ref 8.9–10.3)
Calcium: 8 mg/dL — ABNORMAL LOW (ref 8.9–10.3)
Chloride: 90 mmol/L — ABNORMAL LOW (ref 98–111)
Chloride: 85 mmol/L — ABNORMAL LOW (ref 98–111)
Creatinine, Ser: 0.86 mg/dL (ref 0.61–1.24)
Creatinine, Ser: 0.86 mg/dL (ref 0.61–1.24)
GFR, Estimated: >60 mL/min (ref >60)
GFR, Estimated: >60 mL/min (ref >60)
Glucose, Bld: 54 mg/dL — ABNORMAL LOW (ref 70–99)
Glucose, Bld: 130 mg/dL — ABNORMAL HIGH (ref 70–99)
Potassium: 3.8 mmol/L (ref 3.5–5.1)
Potassium: 4.7 mmol/L (ref 3.5–5.1)
Sodium: 136 mmol/L (ref 135–145)
Sodium: 130 mmol/L — ABNORMAL LOW (ref 135–145)

## 2020-03-22 LAB — GLUCOSE, CAPILLARY
Glucose-Capillary: 103 mg/dL — ABNORMAL HIGH (ref 70–99)
Glucose-Capillary: 114 mg/dL — ABNORMAL HIGH (ref 70–99)
Glucose-Capillary: 135 mg/dL — ABNORMAL HIGH (ref 70–99)
Glucose-Capillary: 162 mg/dL — ABNORMAL HIGH (ref 70–99)
Glucose-Capillary: 179 mg/dL — ABNORMAL HIGH (ref 70–99)
Glucose-Capillary: 54 mg/dL — ABNORMAL LOW (ref 70–99)
Glucose-Capillary: 66 mg/dL — ABNORMAL LOW (ref 70–99)
Glucose-Capillary: 70 mg/dL (ref 70–99)
Glucose-Capillary: 73 mg/dL (ref 70–99)
Glucose-Capillary: 97 mg/dL (ref 70–99)

## 2020-03-22 LAB — PROTIME-INR
INR: 1.9 — ABNORMAL HIGH (ref 0.8–1.2)
Prothrombin Time: 20.8 seconds — ABNORMAL HIGH (ref 11.4–15.2)

## 2020-03-22 LAB — HEPATIC FUNCTION PANEL
ALT: 33 U/L (ref 0–44)
AST: 48 U/L — ABNORMAL HIGH (ref 15–41)
Albumin: 3 g/dL — ABNORMAL LOW (ref 3.5–5.0)
Alkaline Phosphatase: 81 U/L (ref 38–126)
Bilirubin, Direct: 0.3 mg/dL — ABNORMAL HIGH (ref 0.0–0.2)
Indirect Bilirubin: 1.4 mg/dL — ABNORMAL HIGH (ref 0.3–0.9)
Total Bilirubin: 1.7 mg/dL — ABNORMAL HIGH (ref 0.3–1.2)
Total Protein: 5.2 g/dL — ABNORMAL LOW (ref 6.5–8.1)

## 2020-03-22 LAB — LACTATE DEHYDROGENASE: LDH: 901 U/L — ABNORMAL HIGH (ref 98–192)

## 2020-03-22 LAB — ECHO TEE
Height: 64 in
Weight: 2544 oz

## 2020-03-22 LAB — LACTIC ACID, PLASMA
Lactic Acid, Venous: 0.7 mmol/L (ref 0.5–1.9)
Lactic Acid, Venous: 1.6 mmol/L (ref 0.5–1.9)

## 2020-03-22 LAB — FIBRINOGEN: Fibrinogen: 512 mg/dL — ABNORMAL HIGH (ref 210–475)

## 2020-03-22 LAB — MAGNESIUM: Magnesium: 1.9 mg/dL (ref 1.7–2.4)

## 2020-03-22 LAB — PHOSPHORUS: Phosphorus: 5.1 mg/dL — ABNORMAL HIGH (ref 2.5–4.6)

## 2020-03-22 MED ORDER — DEXTROSE 10 % IV SOLN: INTRAVENOUS | Status: DC

## 2020-03-22 MED ORDER — POTASSIUM CHLORIDE 10 MEQ/50ML IV SOLN
10.0000 meq | INTRAVENOUS | Status: AC
  Administered 2020-03-22 (×4): 10 meq via INTRAVENOUS
  Filled 2020-03-22 (×4): qty 50

## 2020-03-22 MED ORDER — METOPROLOL TARTRATE 5 MG/5ML IV SOLN
INTRAVENOUS | Status: AC
  Filled 2020-03-22: qty 5

## 2020-03-22 MED ORDER — FENTANYL CITRATE (PF) 100 MCG/2ML IJ SOLN
50.0000 ug | INTRAMUSCULAR | Status: DC | PRN
  Administered 2020-03-23 – 2020-03-28 (×7): 100 ug via INTRAVENOUS
  Filled 2020-03-22 (×5): qty 2

## 2020-03-22 MED ORDER — CLONIDINE HCL 0.2 MG PO TABS
0.2000 mg | ORAL_TABLET | Freq: Three times a day (TID) | ORAL | Status: DC
  Administered 2020-03-22 – 2020-04-08 (×51): 0.2 mg
  Filled 2020-03-22 (×52): qty 1

## 2020-03-22 MED ORDER — FREE WATER
60.0000 mL | Freq: Three times a day (TID) | Status: DC
  Administered 2020-03-22 – 2020-03-24 (×6): 60 mL

## 2020-03-22 MED ORDER — METOCLOPRAMIDE HCL 5 MG/ML IJ SOLN
5.0000 mg | Freq: Three times a day (TID) | INTRAMUSCULAR | Status: DC
  Administered 2020-03-22 – 2020-03-26 (×13): 5 mg via INTRAVENOUS
  Filled 2020-03-22 (×13): qty 2

## 2020-03-22 MED ORDER — MAGNESIUM SULFATE 2 GM/50ML IV SOLN
2.0000 g | Freq: Once | INTRAVENOUS | Status: AC
  Administered 2020-03-22: 2 g via INTRAVENOUS
  Filled 2020-03-22: qty 50

## 2020-03-22 MED ORDER — OXYCODONE HCL 5 MG PO TABS
20.0000 mg | ORAL_TABLET | Freq: Four times a day (QID) | ORAL | Status: DC
  Administered 2020-03-22 – 2020-04-04 (×49): 20 mg
  Filled 2020-03-22 (×52): qty 4

## 2020-03-22 NOTE — Plan of Care (Signed)
  Problem: Coping: Goal: Level of anxiety will decrease Outcome: Progressing   Problem: Pain Managment: Goal: General experience of comfort will improve Outcome: Progressing   

## 2020-03-22 NOTE — Progress Notes (Signed)
ECMO PROGRESS NOTE  NAME:  Alex [name], MRN:  [id number], DOB:  12/10/1970, LOS: 13 ADMISSION DATE:  07/15/2019, CONSULTATION DATE: 03/02/2020 REFERRING MD: Wynona Neatlalere -LBPCCM, CHIEF COMPLAINT: Respiratory failure requiring ECMO  HPI/course in hospital  50 year old man admitted to hospital 12/28 with 1 week history of dyspnea cough nausea and vomiting.  Initially admitted to [facility] long hospital and placed on high flow nasal cannula but rapidly failed and required intubation 12/29.  Persistent hypoxic respiratory failure with PF ratio 55 in spite of 18 of PEEP FiO2 0.1 despite paralytics.  Did not improve with prone ventilation  Cannulated for VV ECMO 12/30 via right IJ crescent cannula. Iatrogenic pneumothorax from left subclavian triple-lumen placement  Family confirms no significant past medical history apart from possible prediabetes  Past Medical History  none  Interim history/subjective:  1/10: still on escalated sedation. Will increase po to wean continuous, wbc normal. Lactate normal. Will stop vanc (neg pcr)  Objective   Blood pressure 109/71, pulse 73, temperature 97.88 F (36.6 C), resp. rate (!) 24, height 5\' 4"  (1.626 m), weight 75.8 kg, SpO2 100 %.    Vent Mode: PCV FiO2 (%):  [50 %] 50 % Set Rate:  [10 bmp] 10 bmp PEEP:  [10 cmH20] 10 cmH20 Plateau Pressure:  [18 cmH20-29 cmH20] 29 cmH20   Intake/Output Summary (Last 24 hours) at 03/22/2020 0941 Last data filed at 03/22/2020 0800 Gross per 24 hour  Intake 4390.53 ml  Output 8335 ml  Net -3944.47 ml   Filed Weights   03/20/20 0645 03/21/20 0600 03/22/20 0700  Weight: 77.5 kg 78.5 kg 75.8 kg    ECMO Device: Cardiohelp  ECMO Mode: VV  Flow (LPM): 3.74   Examination: General: critically ill appearing man laying in bed in NAD, on MV and ECMO HEENT: [place]/AT, eyes anicteric, oral mucosa moist. Cortrak left nare. Neck: RIJ ECMO cannula, trach w/o bleeding. Cardio: S1S2, RRR Respiratory: breathing comfortably,  tachypnea but controlled WOB. VT ~300cc Abdomen distended, soft and nontender, tympanitic to percussion Extremities: left hand with cyanotic digits x 5,purple toe left first toe, R radial Aline. Pitting edema. Derm : no rashes or wounds Neuro: RASS -2, follows commands whens edation lightened    CXR personally reviewed- dense infiltrates but slightly improved from yesterday, trach and ECMO cannnula in appropriate positions  Ancillary tests (personally reviewed)  CBC: Recent Labs  Lab 03/20/20 0408 03/20/20 0415 03/20/20 1650 03/20/20 1808 03/21/20 0337 03/21/20 0346 03/21/20 1624 03/21/20 1634 03/21/20 2256 03/22/20 0031 03/22/20 0200 03/22/20 0324 03/22/20 0604  WBC 12.2*  --  15.2*  --  11.5*  --  15.0*  --   --   --   --  9.5  --   HGB 8.5*   < > 8.6*   < > 7.7*   < > 10.0*   < > 8.8* 8.8* 9.2* 9.3* 8.8*  HCT 26.5*   < > 26.5*   < > 24.2*   < > 29.5*   < > 26.0* 26.0* 27.0* 27.7* 26.0*  MCV 92.7  --  91.1  --  92.4  --  90.2  --   --   --   --  91.7  --   PLT 116*  --  117*  --  114*  --  130*  --   --   --   --  121*  --    < > = values in this interval not displayed.    Basic Metabolic  Panel: Recent Labs  Lab 03/17/20 0349 03/17/20 0904 03/20/20 0408 03/20/20 0415 03/20/20 1650 03/20/20 1808 03/21/20 0337 03/21/20 0346 03/21/20 1624 03/21/20 1634 03/21/20 2256 03/22/20 0031 03/22/20 0200 03/22/20 0324 03/22/20 0604  NA 144  146*   < > 138   < > 136   < > 136   < > 135   < > 133* 133* 133* 136 132*  K 3.8  3.9   < > 4.0   < > 4.2   < > 3.7   < > 5.1   < > 4.2 3.8 3.9 3.8 3.6  CL 106   < > 98  --  95*  --  93*  --  91*  --   --   --   --  90*  --   CO2 29   < > 31  --  32  --  32  --  33*  --   --   --   --  36*  --   GLUCOSE 156*   < > 143*  --  161*  --  133*  --  106*  --   --   --   --  54*  --   BUN 37*   < > 32*  --  36*  --  37*  --  32*  --   --   --   --  30*  --   CREATININE 0.84   < > 0.89  --  0.89  --  0.97  --  0.94  --   --   --   --  0.86   --   CALCIUM 7.3*   < > 7.5*  --  7.6*  --  7.6*  --  7.8*  --   --   --   --  7.9*  --   MG 2.8*  --   --   --   --   --   --   --  1.9  --   --   --   --  1.9  --   PHOS 2.4*  --   --   --   --   --   --   --  4.3  --   --   --   --  5.1*  --    < > = values in this interval not displayed.   GFR: Estimated Creatinine Clearance: 98.8 mL/min (by C-G formula based on SCr of 0.86 mg/dL). Recent Labs  Lab 03/20/20 1650 03/21/20 0337 03/21/20 1624 03/22/20 0324  WBC 15.2* 11.5* 15.0* 9.5  LATICACIDVEN 1.7 1.3 1.3 0.7    Liver Function Tests: Recent Labs  Lab 03/18/20 0412 03/19/20 0322 03/20/20 0408 03/21/20 0337 03/22/20 0324  AST 44* 41 43* 46* 48*  ALT 40 37 32 31 33  ALKPHOS 82 83 98 85 81  BILITOT 1.3* 1.4* 1.2 1.3* 1.7*  PROT 4.2* 4.7* 4.7* 4.9* 5.2*  ALBUMIN 2.6* 2.9* 2.7* 3.2* 3.0*   No results for input(s): LIPASE, AMYLASE in the last 168 hours. No results for input(s): AMMONIA in the last 168 hours.  ABG    Component Value Date/Time   PHART 7.446 03/22/2020 0604   PCO2ART 60.3 (H) 03/22/2020 0604   PO2ART 139 (H) 03/22/2020 0604   HCO3 41.6 (H) 03/22/2020 0604   TCO2 43 (H) 03/22/2020 0604   ACIDBASEDEF 1.0 03/17/2020 1608   O2SAT 99.0 03/22/2020 0604     Coagulation Profile: Recent  Labs  Lab 03/18/20 0412 03/19/20 0322 03/20/20 0408 03/21/20 0337 03/22/20 0324  INR 2.0* 1.9* 1.8* 1.9* 1.9*    Cardiac Enzymes: No results for input(s): CKTOTAL, CKMB, CKMBINDEX, TROPONINI in the last 168 hours.  HbA1C: Hgb A1c MFr Bld  Date/Time Value Ref Range Status  03/05/2020 06:10 PM 11.8 (H) 4.8 - 5.6 % Final    Comment:    (NOTE) Pre diabetes:          5.7%-6.4%  Diabetes:              >6.4%  Glycemic control for   <7.0% adults with diabetes   02/21/2020 08:30 AM 12.2 (H) 4.8 - 5.6 % Final    Comment:    (NOTE) Pre diabetes:          5.7%-6.4%  Diabetes:              >6.4%  Glycemic control for   <7.0% adults with diabetes      CBG: Recent Labs  Lab 03/22/20 0158 03/22/20 0327 03/22/20 0601 03/22/20 0720 03/22/20 0741  GLUCAP 70 54* 97 66* 179*     Assessment & Plan:   Acute hypoxic and hypercarbic respiratory failure requiring VV ECMO support and mechanical ventilation. ARDS due to COVID-19 viral pneumonia Pneumothorax on left Bilateral DVT , cardiac clot in transit. Small PE but hemodynamically stable. Concern for RLL pneumonia -Con't ultralung protective ventilation. Ok to start PMV trials on vent with SLP. WOB likely too uncontrolled to start TCTs. -Con't full ECMO support, weaning as tolerated. -Con't lasix infusion but decrease dose to 4 -Con't chest tube to suction -Routine trach care -Complete courses of remdesivir,, Actemra. -steroid taper in -Completed empiric courses of antibiotics for community-acquired pneumonia. S/p 8 days vanc so will stop today.  -cefepime for 10 days today.  -Will need 3 months of anticoagulation for provoked thromboembolic disease. -increasing seroquel and klonopin to help dyspnea and anxiety to work on coming off infusions. Con't precedex and fentanyl gtt PRN. Con't enteral oxycodone. -con't steroid taper, 40mg  x 3 days, then decrease by 10mg  Q3 days. Monitor for worsening hypoxia.  Agitation-improved now back on mechanical ventilation. -Continue Precedex infusion; limit as possible. 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 LDH high Thrombocytopenia; remains stable -Transfuse for hemoglobin less than 8 or hemodynamically significant bleeding. 1 unit pRBCs overnight. -Continue to monitor platelets   Leukocytosis-Concern for right lower lobe  pneumonia, likely bacteremia -blood cx with variant strains of staph, completed 8 days vanc -cefepime for 10 days today.  -sputum negative  Complete heart block; associated with hypoxia due to cannula positioning issues -Resolved -Continue to hold metoprolol  FEN -holding tube feeds Abdominal distention -adding reglan -kub pending -cont bowel regimen  Deconditioning -needs PT, OT, SLP  Daily Goals Checklist  Pain/Anxiety/Delirium protocol (if indicated): enteral oxycodone and clonazepam, precedex, seroquel, clonidine VAP protocol (if indicated): extubated DVT prophylaxis: Systemic bivalirudin Nutritional status and feeding goals: High nutritional risk, tube feeds via core track GI prophylaxis: Pantoprazole Urinary catheter: Assessment of intravascular volume Central lines: Right IJ crescent, left subclavian triple-lumen, right radial arterial line Glucose control:  Monitoring until resumption of tf Mobility/therapy needs: progressive mobility Code Status: Full  Family Communication: will update son this afternoon Last GoC meeting 03/18/19 with brother, son, friend> con't aggressive care  Multidisciplinary ECMO rounds with cardiology, pharmacy, RNs, ECMO specialists, and PCCM.  Critical care time: The patient is critically ill with multiple organ systems failure and requires high complexity decision making for assessment and support, frequent evaluation and titration of therapies, application of advanced monitoring technologies and extensive interpretation of multiple databases.  Critical care time 39 mins. This represents my time independent of the NPs time taking care of the pt. This is excluding procedures.    Briant Sites DO [facility] 03/22/2020, 9:41 AM

## 2020-03-22 NOTE — Evaluation (Addendum)
Passy-Muir Speaking Valve - Evaluation Patient Details  Name: Alex [name] MRN: [id number] Date of Birth: 1971/02/12  Today's Date: 03/22/2020 Time: 1000-1030 SLP Time Calculation (min) (ACUTE ONLY): 30 min  Past Medical History: History reviewed. No pertinent past medical history. Past Surgical History:  Past Surgical History:  Procedure Laterality Date  . CENTRAL LINE INSERTION  02/11/2020   Procedure: CENTRAL LINE INSERTION;  Surgeon: Laurey Morale, MD;  Location: [facility] INVASIVE CV LAB;  Service: Cardiovascular;;  . ECMO CANNULATION N/A 03/03/2020   Procedure: ECMO CANNULATION;  Surgeon: Laurey Morale, MD;  Location: [facility] INVASIVE CV LAB;  Service: Cardiovascular;  Laterality: N/A;  . TEE WITHOUT CARDIOVERSION  03/02/2020   Procedure: TRANSESOPHAGEAL ECHOCARDIOGRAM (TEE);  Surgeon: Laurey Morale, MD;  Location: [facility] INVASIVE CV LAB;  Service: Cardiovascular;;  . [place] teeth     HPI:  50 year old man admitted to hospital 12/28 with 1 week history of dyspnea cough nausea and vomiting.  Initially admitted to [facility] long hospital and placed on high flow nasal cannula but rapidly failed and required intubation 12/29.  Persistent hypoxic respiratory failure with PF ratio 55 in spite of 18 of PEEP FiO2 0.1 despite paralytics.  Did not improve with prone ventilation. Cannulated for VV ECMO 12/30 via right IJ crescent cannula. Iatrogenic pneumothorax from left subclavian triple-lumen placement. Trach on 1/7.   Assessment / Plan / Recommendation Clinical Impression  Pt demonstrates excellent PMSV tolerance on first session. Pt on Pressure control ventilation with 10 CM H20 above 10 of PEEP. RR set at 10. Pt typically achieving about 300 cc tidal volumes at baseline. RN and RT report anxiety that has limited success with interventions. Pt alert and able to follow commands upon arrival, using gestures to communicate. RT decreased PEEP to 3, SLP slowly deflated cuff though there was not immediate  decrease in exhaled volumes. SLP repositioned pts head and volues dropped to 80.  RT increased trigger sensitivity to 5.  PMSV placed and pt able to achieve breathy phonation with some verbal cueing and feedback needed. Pt gradually able to follow more and more cues to take deeper breaths and increase volume at the word level. Pt noted to achieve over 500cc inspiratory volumes at times. Pt not always intelligible at phrase level, but accuracy definitely improved by the end of the session. Pt able to read the date written on a piece of paper with glasses on and some pointing and guiding needed. Pt may be able to use a communication board, but vision may be a barrier to this. He was eager to talk about physical therapy and his son. Will continue efforts as many sessions as possible during the week as pt is having great benefit from interventions at this time.   SLP Assessment  Patient needs continued Speech Lanaguage Pathology Services    Follow Up Recommendations       Frequency and Duration min 2x/week  2 weeks    PMSV Trial PMSV was placed for: 5 minutes Able to redirect subglottic air through upper airway: Yes Able to Attain Phonation: Yes Voice Quality: Hoarse Able to Expectorate Secretions: Yes Level of Secretion Expectoration with PMSV: Oral Breath Support for Phonation: Severely decreased Intelligibility: Intelligibility reduced Word: 50% accurate Phrase: 50% Sentence: 50% Respirations During Trial: 30 SpO2 During Trial: 100 % Pulse During Trial:    Tracheostomy Tube       Vent Dependency  Vent Mode: PCV Set Rate: 10 bmp PEEP: 10 cmH20 FiO2 (%): 50 %  Cuff Deflation Trial  GO  Harlon Ditty, MA CCC-SLP  Acute Rehabilitation Services Pager [phone] Office [phone]  Tolerated Cuff Deflation: Yes Behavior: Alert        Claudine Mouton 03/22/2020, 11:22 AM

## 2020-03-22 NOTE — Progress Notes (Signed)
Patient ID: Alex [name], male   DOB: 1971/02/23, 50 y.o.   MRN: [id number]     Advanced Heart Failure Rounding Note  PCP-Cardiologist: No primary care provider on file.   Subjective:    - 12/30: VV ECMO cannulation - 12/31: Left chest tube replaced - 1/2: Extubated. Echo with EF 60-65%, mildly dilated RV with mildly decreased systolic function.  - 1/4: Agitated, suspected aspiration.  Re-intubated.  - 1/5: ECMO cannula repositioned under TEE guidance. TEE showed moderately dilated/moderate-severely dysfunctional RV in setting of hypoxemia. LUE DVT found.  Patient got 1/2 dose of TPA due to initial concern for large PE.  - 1/6: Tracheostomy - 1/7: Echo with mild RV dilation/mild RV dysfunction.   Good diuresis with Lasix gtt 6 mg/hr, weight down 6 lbs.   CXR looks better today.   Bivalirudin gtt PTT 81.   On vancomycin/cefepime.   ECMO parameters: 3000 rpm Flow 3.76 L/min Pvenous -47 Delta P 20 Sweep 3 ABG 7.43/62/114/98% LDH 410 => 543 => 728 => 1041 => 998 => 1305 => 934 => 959 => 1009 => 893 => 901 Lactate 0.7  Objective:   Weight Range: 75.8 kg Body mass index is 28.68 kg/m.   Vital Signs:   Temp:  [97.88 F (36.6 C)-98.6 F (37 C)] 97.88 F (36.6 C) (01/10 0830) Pulse Rate:  [69-138] 73 (01/10 0830) Resp:  [0-38] 24 (01/10 0830) BP: (101-204)/(62-129) 109/71 (01/10 0830) SpO2:  [93 %-100 %] 100 % (01/10 0830) Arterial Line BP: (109-319)/(57-141) 111/58 (01/10 0830) FiO2 (%):  [50 %] 50 % (01/10 0806) Weight:  [75.8 kg] 75.8 kg (01/10 0700) Last BM Date: 03/20/20  Weight change: Filed Weights   03/20/20 0645 03/21/20 0600 03/22/20 0700  Weight: 77.5 kg 78.5 kg 75.8 kg    Intake/Output:   Intake/Output Summary (Last 24 hours) at 03/22/2020 0939 Last data filed at 03/22/2020 0800 Gross per 24 hour  Intake 4390.53 ml  Output 8335 ml  Net -3944.47 ml      Physical Exam    General: Sedated on vent Neck: No JVD, no thyromegaly or thyroid nodule.   Lungs: Decreased bilaterally.  CV: Nondisplaced PMI.  Heart regular S1/S2, no S3/S4, no murmur.  No peripheral edema.   Abdomen: Soft, nontender, no hepatosplenomegaly, no distention.  Skin: Intact without lesions or rashes.  Neurologic: Alert and oriented x 3.  Psych: Normal affect. Extremities: No clubbing or cyanosis.  HEENT: Normal.    Telemetry   NSR 90s (personally reviewed)  Labs    CBC Recent Labs    03/21/20 1624 03/21/20 1634 03/22/20 0324 03/22/20 0604  WBC 15.0*  --  9.5  --   HGB 10.0*   < > 9.3* 8.8*  HCT 29.5*   < > 27.7* 26.0*  MCV 90.2  --  91.7  --   PLT 130*  --  121*  --    < > = values in this interval not displayed.   Basic Metabolic Panel Recent Labs    03/21/20 1624 03/21/20 1634 03/22/20 0324 03/22/20 0604  NA 135   < > 136 132*  K 5.1   < > 3.8 3.6  CL 91*  --  90*  --   CO2 33*  --  36*  --   GLUCOSE 106*  --  54*  --   BUN 32*  --  30*  --   CREATININE 0.94  --  0.86  --   CALCIUM 7.8*  --  7.9*  --  MG 1.9  --  1.9  --   PHOS 4.3  --  5.1*  --    < > = values in this interval not displayed.   Liver Function Tests Recent Labs    03/21/20 0337 03/22/20 0324  AST 46* 48*  ALT 31 33  ALKPHOS 85 81  BILITOT 1.3* 1.7*  PROT 4.9* 5.2*  ALBUMIN 3.2* 3.0*   No results for input(s): LIPASE, AMYLASE in the last 72 hours. Cardiac Enzymes No results for input(s): CKTOTAL, CKMB, CKMBINDEX, TROPONINI in the last 72 hours.  BNP: BNP (last 3 results) No results for input(s): BNP in the last 8760 hours.  ProBNP (last 3 results) No results for input(s): PROBNP in the last 8760 hours.   D-Dimer No results for input(s): DDIMER in the last 72 hours. Hemoglobin A1C No results for input(s): HGBA1C in the last 72 hours. Fasting Lipid Panel No results for input(s): CHOL, HDL, LDLCALC, TRIG, CHOLHDL, LDLDIRECT in the last 72 hours. Thyroid Function Tests No results for input(s): TSH, T4TOTAL, T3FREE, THYROIDAB in the last 72  hours.  Invalid input(s): FREET3  Other results:   Imaging    DG CHEST PORT 1 VIEW  Result Date: 03/22/2020 CLINICAL DATA:  On ECMO.  COVID pneumonia. EXAM: PORTABLE CHEST 1 VIEW COMPARISON:  03/21/2020 FINDINGS: Stable position of ECMO catheter. Left IJ catheter tip is in the SVC. Left-sided chest tube in place. Tracheostomy tube tip is above the carina. Diffuse bilateral lung opacities are unchanged when compared with the previous exam. IMPRESSION: 1. Stable support apparatus. 2. No change in aeration to the lungs compared with previous exam Electronically Signed   By: Kerby Moors M.D.   On: 03/22/2020 07:19     Medications:     Scheduled Medications: . sodium chloride   Intravenous Once  . chlorhexidine gluconate (MEDLINE KIT)  15 mL Mouth Rinse BID  . Chlorhexidine Gluconate Cloth  6 each Topical Daily  . clonazePAM  1.5 mg Per Tube Q6H  . cloNIDine  0.2 mg Per Tube Q8H  . docusate  100 mg Per Tube BID  . free water  200 mL Per Tube Q8H  . guaiFENesin-dextromethorphan  10 mL Per Tube BID  . insulin aspart  3-9 Units Subcutaneous Q4H  . living well with diabetes book   Does not apply Once  . mouth rinse  15 mL Mouth Rinse 10 times per day  . metoCLOPramide (REGLAN) injection  5 mg Intravenous Q8H  . midazolam  5 mg Intravenous Once  . oxyCODONE  20 mg Per Tube Q6H  . pantoprazole sodium  40 mg Per Tube QHS  . polyethylene glycol  17 g Per Tube BID  . [START ON 03/23/2020] predniSONE  30 mg Per Tube Q breakfast   Followed by  . [START ON 03/26/2020] predniSONE  20 mg Per Tube Q breakfast   Followed by  . [START ON 03/29/2020] predniSONE  10 mg Per Tube Q breakfast  . propofol  500 mg Intravenous Once  . QUEtiapine  150 mg Per Tube TID  . sennosides  10 mL Per Tube BID  . sodium chloride flush  10-40 mL Intracatheter Q12H    Infusions: . sodium chloride    . sodium chloride 10 mL/hr at 03/22/20 0800  . sodium chloride Stopped (03/21/20 0827)  . sodium chloride  Stopped (03/12/20 0131)  . albumin human 12.5 g (03/20/20 2136)  . bivalirudin (ANGIOMAX) infusion 0.5 mg/mL (Non-ACS indications) 0.27 mg/kg/hr (03/22/20 0800)  .  ceFEPime (MAXIPIME) IV 2 g (03/22/20 0803)  . dexmedetomidine (PRECEDEX) IV infusion 1.3 mcg/kg/hr (03/22/20 0850)  . dextrose 50 mL/hr at 03/22/20 0800  . feeding supplement (PIVOT 1.5 CAL) Stopped (03/21/20 0800)  . fentaNYL infusion INTRAVENOUS 250 mcg/hr (03/22/20 0800)  . furosemide (LASIX) 200 mg in dextrose 5% 100 mL (70m/mL) infusion 4 mg/hr (03/22/20 0800)  . norepinephrine (LEVOPHED) Adult infusion Stopped (03/17/20 2127)  . potassium chloride 10 mEq (03/22/20 0901)    PRN Medications: Place/Maintain arterial line **AND** sodium chloride, sodium chloride, sodium chloride, acetaminophen (TYLENOL) oral liquid 160 mg/5 mL, acetaminophen, albumin human, chlorpheniramine-HYDROcodone, dextrose, fentaNYL (SUBLIMAZE) injection, guaiFENesin, haloperidol lactate, hydrALAZINE, labetalol, lip balm, LORazepam, ondansetron (ZOFRAN) IV, oxyCODONE, sodium chloride flush   Assessment/Plan   1. Acute hypoxemic respiratory failure: Due to COVID-19 PNA with bilateral infiltrates.  Refractory hypoxemia, VV-ECMO cannulation on 02/12/2020 with improvement in oxygenation.  Developed left PTX post-subclavian CVL and has left chest tube, the left lung is re-expanded.  He was extubated 1/2 but reintubated 1/4 with agitation and suspected aspiration.  Tracheostomy 1/6.  CXR with bilateral infiltrates, improved today.  He is now on vancomycin/cefepime for empiric abx coverage.  ECMO cannula repositioned 1/5.  LDH elevated but stable.  - ECMO circuit functioning appropriately.  - Continue bivalirudin, goal PTT 60-80.  - Patient has had remdesivir, tocilizumab. - Ongoing steroids with prednisone.  - Continue vancomycin/cefepime x 7 days.    - Can decrease Lasix to 4 mg/hr, aim for even to slightly negative.  2. RLE DVT/LUE DVT/thrombus in RV/suspect  PE: Echo with moderately dilated and moderately dysfunctional RV.  Clot noted on TEE in RV as well.  TTE 1/2 showed normal EF 60-65%, RV improved (mildly dilated/dysfunctional). TEE on 1/5 with moderate to severe RV dysfunction but patient was hypoxemic.  Had 1/2 dose TPA on 1/5. Echo 1/7 with mildly dilated/mildly dysfunctional RV.  - Bivalirudin for goal PTT 60-80.   3. Left PTX: Left chest tube, lung is re-expanded.   4. Shock: Suspect septic/distributive.  Now resolved, off NE.  5. Anemia: Hgb 9.3, transfuse < 8.   6. AKI: Resolved.  7. Hyperglycemia: insulin.  8. HTN: Following cuff pressure (whip in ABG). 9. CHB: Episode of CHB when hypoxemic.  Now off metoprolol.  NSR today.  10. Thrombocytopenia: Mild, follow.  11. FEN: TFs.   CRITICAL CARE Performed by: DLoralie Champagne Total critical care time: 40 minutes  Critical care time was exclusive of separately billable procedures and treating other patients.  Critical care was necessary to treat or prevent imminent or life-threatening deterioration.  Critical care was time spent personally by me on the following activities: development of treatment plan with patient and/or surrogate as well as nursing, discussions with consultants, evaluation of patient's response to treatment, examination of patient, obtaining history from patient or surrogate, ordering and performing treatments and interventions, ordering and review of laboratory studies, ordering and review of radiographic studies, pulse oximetry and re-evaluation of patient's condition.    Length of Stay: 129 DLoralie Champagne MD  03/22/2020, 9:39 AM  Advanced Heart Failure Team Pager [phone](M-F; 7a - 4p)  Please contact [facility] for night-coverage after hours (4p -7a ) and weekends on [email or web address]

## 2020-03-22 NOTE — Progress Notes (Signed)
Physical Therapy Treatment Patient Details Name: Alex [name] MRN: [id number] DOB: 05/21/1970 Today's Date: 03/22/2020    History of Present Illness 50 y.o. male  with no significant past medical history admitted on 03-25-2020 with dyspnea, cough, nausea/vomiting ~ 1 week ago with worsening symptoms of body aches and fatigue and +COVID 02/07/20 and admitted with shortness of breath. Required intubation 03/10/20. Cannulated for VV ECMO 02/13/2020. Oxygenating better with ECMO. Also evidence of RLE DVT, RV thrombus, and high suspicion of PE. Has required chest tube to L lung for collapse which needs further reposition with recurrent collapse. Extubated 03-15-19.  Reintubated 1/5 and trach placed 1/6.    PT Comments    Goal of session to switch regular bed for Kreg bed. RN, ECMO specialist and RT present to facilitate move with PT. Pt able to utilize [place] for mobility between beds. Pt requires modAx3 for coming to EoB, maxAx3 for power up to [facility] and total A to return to bed.  Pt tolerated sitting in Stedy for ~10 minutes with mod-maxAx2 for steadying and line management while beds were changed out behind him. Pt grateful for son present throughout session. Pt vitals appropriate for level of exertion. Pt very frustrated with inability to communicate due to tracheostomy. SLP to work with pt more tomorrow with in-line valve. D/c plans remain appropriate. PT will continue to follow acutely.    Follow Up Recommendations  CIR;Supervision/Assistance - 24 hour     Equipment Recommendations  Other (comment) (TBA)    Recommendations for Other Services       Precautions / Restrictions Precautions Precautions: Fall Precaution Comments: ECMO, Vent, chest tube, rectal tube, Foley Restrictions Weight Bearing Restrictions: No    Mobility  Bed Mobility Overal bed mobility: Needs Assistance Bed Mobility: Rolling;Sidelying to Sit;Sit to Sidelying Rolling: Mod assist;+2 for physical assistance (extra 2  persons for ECMO and other lines) Sidelying to sit: Mod assist;Max assist;+2 for physical assistance (extra 2 persons)     Sit to sidelying: Total assist;+2 for physical assistance (extra 2 persons for lines) General bed mobility comments: modAx3 for helicopter transfer to EoB and total A for returning to bed, heavy use of bed pad to scoot back in bed due to fact Stedy did not fit under Kreg bed  Transfers Overall transfer level: Needs assistance   Transfers: Sit to/from Stand Sit to Stand: Total assist;+2 physical assistance;Max assist         General transfer comment: pt very eager to get out of bed and nods understanding to use of Stedy. pt is maxAx2 for initial sit>stand to [place], vc for upright posture for power up, good ability to come to fully upright for pad placement, pt able to sit in [place] for 10 minutes, pt requires total A due to fatigue  Ambulation/Gait             General Gait Details: TBA         Balance Overall balance assessment: Needs assistance Sitting-balance support: Feet supported;Bilateral upper extremity supported Sitting balance-Leahy Scale: Poor Sitting balance - Comments: pt able to sit in Stedy for ~10 minutes with min-modA for steadying, increased RR however maintains SaO2 on 50%FiO2.                                    Cognition Arousal/Alertness: Awake/alert Behavior During Therapy: Flat affect Overall Cognitive Status: Within Functional Limits for tasks assessed  General Comments: pt very frustrated by not being able to communicate with staff      Exercises General Exercises - Lower Extremity Ankle Circles/Pumps: PROM;Both;5 reps;Supine Long Arc Quad: AROM;Both;5 reps;Seated Heel Slides: PROM;Both;10 reps;Supine    General Comments General comments (skin integrity, edema, etc.): Pt son present during session and very motivating to pt. RN, ECMO specialist and RT all present  to assit with bed transfer      Pertinent Vitals/Pain Pain Assessment: Faces Faces Pain Scale: Hurts little more Pain Location: generalized Pain Descriptors / Indicators: Grimacing;Guarding;Discomfort Pain Intervention(s): Limited activity within patient's tolerance;Monitored during session;Repositioned           PT Goals (current goals can now be found in the care plan section) Acute Rehab PT Goals Patient Stated Goal: unable to state PT Goal Formulation: Patient unable to participate in goal setting Time For Goal Achievement: 03/29/20 Potential to Achieve Goals: Fair Progress towards PT goals: Progressing toward goals    Frequency    Min 3X/week      PT Plan Current plan remains appropriate       AM-PAC PT "6 Clicks" Mobility   Outcome Measure  Help needed turning from your back to your side while in a flat bed without using bedrails?: A Lot Help needed moving from lying on your back to sitting on the side of a flat bed without using bedrails?: A Lot Help needed moving to and from a bed to a chair (including a wheelchair)?: Total Help needed standing up from a chair using your arms (e.g., wheelchair or bedside chair)?: Total Help needed to walk in hospital room?: Total Help needed climbing 3-5 steps with a railing? : Total 6 Click Score: 8    End of Session Equipment Utilized During Treatment: Other (comment) (vent) Activity Tolerance: Patient limited by fatigue Patient left: in bed;with call bell/phone within reach;with bed alarm set;with restraints reapplied;with nursing/sitter in room Nurse Communication: Mobility status PT Visit Diagnosis: Muscle weakness (generalized) (M62.81);Pain Pain - part of body:  (chest)     Time: 0998-3382 PT Time Calculation (min) (ACUTE ONLY): 57 min  Charges:  $Therapeutic Activity: 53-67 mins                     Manreet Kiernan B. Beverely Risen PT, DPT Acute Rehabilitation Services Pager [phone] Office [phone]    [facility] 03/22/2020, 4:48 PM

## 2020-03-22 NOTE — Progress Notes (Signed)
This chaplain responded to PMT consult for spiritual care.  The chaplain was updated on the Pt. progress by the Pt. RN-SarahJ.  The chaplain hopes to coordinate time with the Pt. Son-Josh when he  visits today.  *1400 Family not present in the Pt. Room.  F/U spiritual care is available as needed.

## 2020-03-22 NOTE — Progress Notes (Signed)
ANTICOAGULATION CONSULT NOTE  Pharmacy Consult for Bivalirudin Indication: ECMO + DVTs  No Known Allergies  Patient Measurements: Height: 5\' 4"  (162.6 cm) Weight: 78.5 kg (173 lb 1 oz) IBW/kg (Calculated) : 59.2 Heparin Dosing Weight: 72kg  Vital Signs: Temp: 98.06 F (36.7 C) (01/10 0700) BP: 113/72 (01/10 0630) Pulse Rate: 72 (01/10 0700)  Labs: Recent Labs    03/20/20 0408 03/20/20 0415 03/21/20 0337 03/21/20 0346 03/21/20 1624 03/21/20 1634 03/22/20 0200 03/22/20 0324 03/22/20 0604  HGB 8.5*   < > 7.7*   < > 10.0*   < > 9.2* 9.3* 8.8*  HCT 26.5*   < > 24.2*   < > 29.5*   < > 27.0* 27.7* 26.0*  PLT 116*   < > 114*  --  130*  --   --  121*  --   APTT 59*   < > 73*  --  65*  --   --  81*  --   LABPROT 20.0*  --  20.8*  --   --   --   --  20.8*  --   INR 1.8*  --  1.9*  --   --   --   --  1.9*  --   CREATININE 0.89   < > 0.97  --  0.94  --   --  0.86  --    < > = values in this interval not displayed.    Estimated Creatinine Clearance: 100.5 mL/min (by C-G formula based on SCr of 0.86 mg/dL).   Medical History: History reviewed. No pertinent past medical history.   Assessment: 79 yoM admitted with COVID-19 PNA with worsening hypoxia, s/p cannulation for ECMO. Pt was started on IV heparin prior to cannulation due to acute DVTs and possible PE,  transitioned to bivalirudin with ECMO. Now s/p tPA on 1/5 and tracheostomy on 1/6.  After discussion this morning with Dr. 3/6 and Dr. Chestine Spore, aPTT goal was increased to 60-80 seconds since no bleeding since trach and + VTE.  CBC stable, fibrinogen up. APTT is slightly above goal at 81 seconds.  Aptt this afternoon is at goal at 65s, given new goal will make small rate increase.  Goal of Therapy:  APTT 60-80 seconds - new goal 1/8 Monitor platelets by anticoagulation protocol: Yes   Plan:  -Reduce bivalirudin to 0.27 mg/kg/hr  -q12h CBC, aptt   Shirlee Latch, PharmD, BCPS, [facility] Clinical  Pharmacist [phone] Please check AMION for all [facility] Pharmacy numbers 03/22/2020

## 2020-03-22 NOTE — Progress Notes (Signed)
ANTICOAGULATION CONSULT NOTE  Pharmacy Consult for Bivalirudin Indication: ECMO + DVTs  No Known Allergies  Patient Measurements: Height: 5\' 4"  (162.6 cm) Weight: 75.8 kg (167 lb 1.7 oz) IBW/kg (Calculated) : 59.2 Heparin Dosing Weight: 72kg  Vital Signs: Temp: 98.78 F (37.1 C) (01/10 1445) Temp Source: Core (01/10 1200) BP: 168/92 (01/10 1830) Pulse Rate: 125 (01/10 1830)  Labs: Recent Labs    03/20/20 0408 03/20/20 0415 03/21/20 05/19/20 03/21/20 0346 03/21/20 1624 03/21/20 1634 03/22/20 0324 03/22/20 0604 03/22/20 1147 03/22/20 1641 03/22/20 1700  HGB 8.5*   < > 7.7*   < > 10.0*   < > 9.3*   < > 10.5* 10.2* 9.8*  HCT 26.5*   < > 24.2*   < > 29.5*   < > 27.7*   < > 31.0* 30.0* 30.3*  PLT 116*   < > 114*  --  130*  --  121*  --   --   --  138*  APTT 59*   < > 73*  --  65*  --  81*  --   --   --  70*  LABPROT 20.0*  --  20.8*  --   --   --  20.8*  --   --   --   --   INR 1.8*  --  1.9*  --   --   --  1.9*  --   --   --   --   CREATININE 0.89   < > 0.97  --  0.94  --  0.86  --   --   --  0.86   < > = values in this interval not displayed.    Estimated Creatinine Clearance: 98.8 mL/min (by C-G formula based on SCr of 0.86 mg/dL).   Medical History: History reviewed. No pertinent past medical history.   Assessment: 87 yoM admitted with COVID-19 PNA with worsening hypoxia, s/p cannulation for ECMO. Pt was started on IV heparin prior to cannulation due to acute DVTs and possible PE,  transitioned to bivalirudin with ECMO. Now s/p tPA on 1/5 and tracheostomy on 1/6.  After discussion this morning with Dr. 3/6 and Dr. Chestine Spore, aPTT goal was increased to 60-80 seconds since no bleeding since trach and + VTE.    aPTT this evening is back within goal after a slight rate decrease earlier today. No bleeding or issues noted per RN.  Goal of Therapy:  APTT 60-80 seconds - new goal 1/8 Monitor platelets by anticoagulation protocol: Yes   Plan:  - Continue bivalirudin at  0.27 mg/kg/hr  -q12h CBC, aptt  Thank you for allowing pharmacy to be a part of this patient's care.  Shirlee Latch, PharmD, BCPS Clinical Pharmacist Clinical phone for 03/22/2020: [phone] 03/22/2020 6:51 PM   **Pharmacist phone directory can now be found on [email or web address] ([email or web address]).  Listed under [facility].

## 2020-03-22 NOTE — Procedures (Signed)
Extracorporeal support note  ECLS cannulation date: 12/30 ECLS support day: 12 Last circuit change: n/a  Indication: Severe respiratory failure secondary to COVID-19 pneumonia with RV dysfunction  Configuration: Venovenous  Drainage cannula: 32 French crescent cannula via right IJ Return cannula: Same  Pump speed: 3000 RPM Pump flow: 3.8 L/min Pump used: Cardio help  Oxygenator: Cardio help O2 blender: 100% Sweep gas: 4L  Circuit check: clot at corners, especially the left- stable Anticoagulant: Bivalirudin Anticoagulation targets: PTT 60-80  Changes in support: Con't current ECMO support. Remains on MV, but struggles with severe anxiety causing uncontrolled WOB, did well with inline speaking valve which really seemed to help anxiety.  Con't antibiotics. He is ready for PT, OT, SLP and is motivated to start. Will obtain standing bed  Anticipated goals/duration of support: Bridge to recovery.  Multidisciplinary ECMO rounds completed.   Briant Sites, DO 03/22/20 12:25 PM [facility]

## 2020-03-22 NOTE — Progress Notes (Signed)
Assisted tele visit to patient with family member.  Thomas, Bonni Neuser Renee, RN   

## 2020-03-22 NOTE — Plan of Care (Signed)
  Problem: Role Relationship: Goal: Method of communication will improve Outcome: Progressing Note: With speech therapy and RT pt able to tolerate using inline PMV for a brief period of time   Problem: Clinical Measurements: Goal: Respiratory complications will improve Outcome: Progressing Goal: Cardiovascular complication will be avoided Outcome: Progressing   Problem: Activity: Goal: Risk for activity intolerance will decrease Outcome: Progressing Note: Sat on edge of bed, stood with stedy, and transferred from regular ICU bed to Kreg bed   Problem: Coping: Goal: Level of anxiety will decrease Outcome: Progressing   Problem: Pain Managment: Goal: General experience of comfort will improve Outcome: Progressing

## 2020-03-23 ENCOUNTER — Inpatient Hospital Stay (HOSPITAL_COMMUNITY): Payer: Medicaid Other

## 2020-03-23 LAB — GLUCOSE, CAPILLARY
Glucose-Capillary: 39 mg/dL — CL (ref 70–99)
Glucose-Capillary: 128 mg/dL — ABNORMAL HIGH (ref 70–99)
Glucose-Capillary: 169 mg/dL — ABNORMAL HIGH (ref 70–99)
Glucose-Capillary: 61 mg/dL — ABNORMAL LOW (ref 70–99)
Glucose-Capillary: 112 mg/dL — ABNORMAL HIGH (ref 70–99)
Glucose-Capillary: 201 mg/dL — ABNORMAL HIGH (ref 70–99)
Glucose-Capillary: 194 mg/dL — ABNORMAL HIGH (ref 70–99)
Glucose-Capillary: 189 mg/dL — ABNORMAL HIGH (ref 70–99)

## 2020-03-23 LAB — POCT I-STAT 7, (LYTES, BLD GAS, ICA,H+H)
Acid-Base Excess: 14 mmol/L — ABNORMAL HIGH (ref 0.0–2.0)
Acid-Base Excess: 12 mmol/L — ABNORMAL HIGH (ref 0.0–2.0)
Acid-Base Excess: 10 mmol/L — ABNORMAL HIGH (ref 0.0–2.0)
Acid-Base Excess: 11 mmol/L — ABNORMAL HIGH (ref 0.0–2.0)
Acid-Base Excess: 11 mmol/L — ABNORMAL HIGH (ref 0.0–2.0)
Bicarbonate: 40.2 mmol/L — ABNORMAL HIGH (ref 20.0–28.0)
Bicarbonate: 38.3 mmol/L — ABNORMAL HIGH (ref 20.0–28.0)
Bicarbonate: 36.5 mmol/L — ABNORMAL HIGH (ref 20.0–28.0)
Bicarbonate: 37.2 mmol/L — ABNORMAL HIGH (ref 20.0–28.0)
Bicarbonate: 37.6 mmol/L — ABNORMAL HIGH (ref 20.0–28.0)
Calcium, Ion: 1.09 mmol/L — ABNORMAL LOW (ref 1.15–1.40)
Calcium, Ion: 1.1 mmol/L — ABNORMAL LOW (ref 1.15–1.40)
Calcium, Ion: 1.1 mmol/L — ABNORMAL LOW (ref 1.15–1.40)
Calcium, Ion: 1.08 mmol/L — ABNORMAL LOW (ref 1.15–1.40)
Calcium, Ion: 1.1 mmol/L — ABNORMAL LOW (ref 1.15–1.40)
HCT: 27 % — ABNORMAL LOW (ref 39.0–52.0)
HCT: 30 % — ABNORMAL LOW (ref 39.0–52.0)
HCT: 29 % — ABNORMAL LOW (ref 39.0–52.0)
HCT: 29 % — ABNORMAL LOW (ref 39.0–52.0)
HCT: 29 % — ABNORMAL LOW (ref 39.0–52.0)
Hemoglobin: 9.2 g/dL — ABNORMAL LOW (ref 13.0–17.0)
Hemoglobin: 10.2 g/dL — ABNORMAL LOW (ref 13.0–17.0)
Hemoglobin: 9.9 g/dL — ABNORMAL LOW (ref 13.0–17.0)
Hemoglobin: 9.9 g/dL — ABNORMAL LOW (ref 13.0–17.0)
Hemoglobin: 9.9 g/dL — ABNORMAL LOW (ref 13.0–17.0)
O2 Saturation: 98 %
O2 Saturation: 91 %
O2 Saturation: 95 %
O2 Saturation: 91 %
O2 Saturation: 93 %
Patient temperature: 98.4
Patient temperature: 36.9
Patient temperature: 36.9
Patient temperature: 36.9
Patient temperature: 99
Potassium: 3.8 mmol/L (ref 3.5–5.1)
Potassium: 4.1 mmol/L (ref 3.5–5.1)
Potassium: 4.2 mmol/L (ref 3.5–5.1)
Potassium: 5 mmol/L (ref 3.5–5.1)
Potassium: 4.6 mmol/L (ref 3.5–5.1)
Sodium: 132 mmol/L — ABNORMAL LOW (ref 135–145)
Sodium: 130 mmol/L — ABNORMAL LOW (ref 135–145)
Sodium: 129 mmol/L — ABNORMAL LOW (ref 135–145)
Sodium: 128 mmol/L — ABNORMAL LOW (ref 135–145)
Sodium: 128 mmol/L — ABNORMAL LOW (ref 135–145)
TCO2: 42 mmol/L — ABNORMAL HIGH (ref 22–32)
TCO2: 40 mmol/L — ABNORMAL HIGH (ref 22–32)
TCO2: 38 mmol/L — ABNORMAL HIGH (ref 22–32)
TCO2: 39 mmol/L — ABNORMAL HIGH (ref 22–32)
TCO2: 39 mmol/L — ABNORMAL HIGH (ref 22–32)
pCO2 arterial: 59.1 mmHg — ABNORMAL HIGH (ref 32.0–48.0)
pCO2 arterial: 59.4 mmHg — ABNORMAL HIGH (ref 32.0–48.0)
pCO2 arterial: 59.3 mmHg — ABNORMAL HIGH (ref 32.0–48.0)
pCO2 arterial: 59.8 mmHg — ABNORMAL HIGH (ref 32.0–48.0)
pCO2 arterial: 59.4 mmHg — ABNORMAL HIGH (ref 32.0–48.0)
pH, Arterial: 7.44 (ref 7.350–7.450)
pH, Arterial: 7.416 (ref 7.350–7.450)
pH, Arterial: 7.397 (ref 7.350–7.450)
pH, Arterial: 7.402 (ref 7.350–7.450)
pH, Arterial: 7.411 (ref 7.350–7.450)
pO2, Arterial: 107 mmHg (ref 83.0–108.0)
pO2, Arterial: 63 mmHg — ABNORMAL LOW (ref 83.0–108.0)
pO2, Arterial: 79 mmHg — ABNORMAL LOW (ref 83.0–108.0)
pO2, Arterial: 63 mmHg — ABNORMAL LOW (ref 83.0–108.0)
pO2, Arterial: 70 mmHg — ABNORMAL LOW (ref 83.0–108.0)

## 2020-03-23 LAB — BASIC METABOLIC PANEL
Anion gap: 11 (ref 5–15)
Anion gap: 10 (ref 5–15)
BUN: 24 mg/dL — ABNORMAL HIGH (ref 6–20)
BUN: 27 mg/dL — ABNORMAL HIGH (ref 6–20)
CO2: 34 mmol/L — ABNORMAL HIGH (ref 22–32)
CO2: 32 mmol/L (ref 22–32)
Calcium: 8 mg/dL — ABNORMAL LOW (ref 8.9–10.3)
Calcium: 8 mg/dL — ABNORMAL LOW (ref 8.9–10.3)
Chloride: 87 mmol/L — ABNORMAL LOW (ref 98–111)
Chloride: 87 mmol/L — ABNORMAL LOW (ref 98–111)
Creatinine, Ser: 0.76 mg/dL (ref 0.61–1.24)
Creatinine, Ser: 0.9 mg/dL (ref 0.61–1.24)
GFR, Estimated: >60 mL/min (ref >60)
GFR, Estimated: >60 mL/min (ref >60)
Glucose, Bld: 61 mg/dL — ABNORMAL LOW (ref 70–99)
Glucose, Bld: 200 mg/dL — ABNORMAL HIGH (ref 70–99)
Potassium: 3.8 mmol/L (ref 3.5–5.1)
Potassium: 5.1 mmol/L (ref 3.5–5.1)
Sodium: 132 mmol/L — ABNORMAL LOW (ref 135–145)
Sodium: 129 mmol/L — ABNORMAL LOW (ref 135–145)

## 2020-03-23 LAB — HEPATIC FUNCTION PANEL
ALT: 35 U/L (ref 0–44)
AST: 67 U/L — ABNORMAL HIGH (ref 15–41)
Albumin: 3 g/dL — ABNORMAL LOW (ref 3.5–5.0)
Alkaline Phosphatase: 122 U/L (ref 38–126)
Bilirubin, Direct: 0.3 mg/dL — ABNORMAL HIGH (ref 0.0–0.2)
Indirect Bilirubin: 1.4 mg/dL — ABNORMAL HIGH (ref 0.3–0.9)
Total Bilirubin: 1.7 mg/dL — ABNORMAL HIGH (ref 0.3–1.2)
Total Protein: 5.2 g/dL — ABNORMAL LOW (ref 6.5–8.1)

## 2020-03-23 LAB — CBC
HCT: 28.3 % — ABNORMAL LOW (ref 39.0–52.0)
HCT: 30.2 % — ABNORMAL LOW (ref 39.0–52.0)
Hemoglobin: 9.2 g/dL — ABNORMAL LOW (ref 13.0–17.0)
Hemoglobin: 9.7 g/dL — ABNORMAL LOW (ref 13.0–17.0)
MCH: 30.3 pg (ref 26.0–34.0)
MCH: 29.9 pg (ref 26.0–34.0)
MCHC: 32.5 g/dL (ref 30.0–36.0)
MCHC: 32.1 g/dL (ref 30.0–36.0)
MCV: 93.1 fL (ref 80.0–100.0)
MCV: 93.2 fL (ref 80.0–100.0)
Platelets: 126 10*3/uL — ABNORMAL LOW (ref 150–400)
Platelets: 148 10*3/uL — ABNORMAL LOW (ref 150–400)
RBC: 3.04 MIL/uL — ABNORMAL LOW (ref 4.22–5.81)
RBC: 3.24 MIL/uL — ABNORMAL LOW (ref 4.22–5.81)
RDW: 17.2 % — ABNORMAL HIGH (ref 11.5–15.5)
RDW: 17.2 % — ABNORMAL HIGH (ref 11.5–15.5)
WBC: 9.5 10*3/uL (ref 4.0–10.5)
WBC: 17.3 10*3/uL — ABNORMAL HIGH (ref 4.0–10.5)
nRBC: 0 % (ref 0.0–0.2)
nRBC: 0 % (ref 0.0–0.2)

## 2020-03-23 LAB — PROTIME-INR
INR: 1.8 — ABNORMAL HIGH (ref 0.8–1.2)
Prothrombin Time: 20 seconds — ABNORMAL HIGH (ref 11.4–15.2)

## 2020-03-23 LAB — FIBRINOGEN: Fibrinogen: 511 mg/dL — ABNORMAL HIGH (ref 210–475)

## 2020-03-23 LAB — LACTIC ACID, PLASMA
Lactic Acid, Venous: 1 mmol/L (ref 0.5–1.9)
Lactic Acid, Venous: 1.4 mmol/L (ref 0.5–1.9)

## 2020-03-23 LAB — APTT
aPTT: 68 seconds — ABNORMAL HIGH (ref 24–36)
aPTT: 71 seconds — ABNORMAL HIGH (ref 24–36)

## 2020-03-23 LAB — MAGNESIUM: Magnesium: 2.1 mg/dL (ref 1.7–2.4)

## 2020-03-23 LAB — LACTATE DEHYDROGENASE: LDH: 1078 U/L — ABNORMAL HIGH (ref 98–192)

## 2020-03-23 MED ORDER — METOPROLOL TARTRATE 25 MG PO TABS
25.0000 mg | ORAL_TABLET | Freq: Two times a day (BID) | ORAL | Status: DC
  Administered 2020-03-23 – 2020-03-29 (×14): 25 mg
  Filled 2020-03-23 (×14): qty 1

## 2020-03-23 MED ORDER — NEOSTIGMINE METHYLSULFATE 10 MG/10ML IV SOLN
0.2500 mg | Freq: Four times a day (QID) | INTRAVENOUS | Status: DC
  Administered 2020-03-23 – 2020-03-24 (×6): 0.25 mg via SUBCUTANEOUS
  Filled 2020-03-23 (×15): qty 0.25

## 2020-03-23 MED ORDER — CHLORHEXIDINE GLUCONATE CLOTH 2 % EX PADS
6.0000 | MEDICATED_PAD | Freq: Every day | CUTANEOUS | Status: DC
  Administered 2020-03-23 – 2020-03-28 (×5): 6 via TOPICAL

## 2020-03-23 MED ORDER — POTASSIUM CHLORIDE 10 MEQ/50ML IV SOLN
10.0000 meq | INTRAVENOUS | Status: AC
  Administered 2020-03-23 (×4): 10 meq via INTRAVENOUS
  Filled 2020-03-23 (×4): qty 50

## 2020-03-23 MED FILL — Medication: Qty: 1 | Status: AC

## 2020-03-23 NOTE — Progress Notes (Signed)
SLP Cancellation Note  Patient Details Name: Alex [name] MRN: [id number] DOB: 05-04-1970   Cancelled treatment:       Reason Eval/Treat Not Completed: Other (comment). Unfortunately SLP and RT could not coordinate schedules for a cotreat for inline PMSV today. RN aware, will attempt tomorrow.   Harlon Ditty, MA CCC-SLP  Acute Rehabilitation Services Pager [phone] Office [phone]  Claudine Mouton 03/23/2020, 10:47 AM

## 2020-03-23 NOTE — Progress Notes (Signed)
ANTICOAGULATION CONSULT NOTE  Pharmacy Consult for Bivalirudin Indication: ECMO + DVTs  No Known Allergies  Patient Measurements: Height: 5\' 4"  (162.6 cm) Weight: 79.8 kg (175 lb 14.8 oz) (weight on Kreg bed that pt was placed on yesterday. Wt with Zflow pillow, one blanket) IBW/kg (Calculated) : 59.2 Heparin Dosing Weight: 72kg  Vital Signs: Temp: 98.4 F (36.9 C) (01/11 1600) Temp Source: Core (01/11 1600) BP: 178/87 (01/11 1800) Pulse Rate: 132 (01/11 1800)  Labs: Recent Labs    03/21/20 0337 03/21/20 0346 03/22/20 0324 03/22/20 0604 03/22/20 1700 03/22/20 1958 03/23/20 0424 03/23/20 0801 03/23/20 1016 03/23/20 1551 03/23/20 1600  HGB 7.7*   < > 9.3*   < > 9.8*   < > 9.2*   < > 9.9* 9.7* 9.9*  HCT 24.2*   < > 27.7*   < > 30.3*   < > 28.3*   < > 29.0* 30.2* 29.0*  PLT 114*   < > 121*  --  138*  --  126*  --   --  148*  --   APTT 73*   < > 81*  --  70*  --  68*  --   --  71*  --   LABPROT 20.8*  --  20.8*  --   --   --  20.0*  --   --   --   --   INR 1.9*  --  1.9*  --   --   --  1.8*  --   --   --   --   CREATININE 0.97   < > 0.86  --  0.86  --  0.76  --   --  0.90  --    < > = values in this interval not displayed.    Estimated Creatinine Clearance: 96.7 mL/min (by C-G formula based on SCr of 0.9 mg/dL).   Medical History: History reviewed. No pertinent past medical history.   Assessment: 65 yoM admitted with COVID-19 PNA with worsening hypoxia, s/p cannulation for ECMO. Pt was started on IV heparin prior to cannulation due to acute DVTs and possible PE,  transitioned to bivalirudin with ECMO. Now s/p tPA on 1/5 and tracheostomy on 1/6.   APTT remains at goal at 71seconds on bivalirudin 0.27mg /kg/hr, CBC stable, LDH back up >1000. Stable clot in corners,   Goal of Therapy:  APTT 60-80 seconds - new goal 1/8 Monitor platelets by anticoagulation protocol: Yes   Plan:  -Continue bivalirudin to 0.27 mg/kg/hr  -q12h CBC, aptt   3/6 Pharm.D. CPP,  BCPS Clinical Pharmacist [phone] 03/23/2020 7:11 PM    Please check AMION for all [facility] Pharmacy numbers 03/23/2020

## 2020-03-23 NOTE — Progress Notes (Signed)
Inpatient Diabetes Program Recommendations  AACE/ADA: New Consensus Statement on Inpatient Glycemic Control (2015)  Target Ranges:  Prepandial:   less than 140 mg/dL      Peak postprandial:   less than 180 mg/dL (1-2 hours)      Critically ill patients:  140 - 180 mg/dL   Lab Results  Component Value Date   GLUCAP 61 (L) 03/23/2020   HGBA1C 11.8 (H) March 18, 2020    Review of Glycemic Control Results for HALEN, ANTENUCCI (MRN [id number]) as of 03/23/2020 09:57  Ref. Range 03/22/2020 16:38 03/22/2020 19:55 03/22/2020 23:25 03/23/2020 04:06  Glucose-Capillary Latest Ref Range: 70 - 99 mg/dL 810 (H) 175 (H) 102 (H) 61 (L)   Diabetes history: No hx. Of DM however A1C=11.8% Outpatient Diabetes medications:  None Current orders for Inpatient glycemic control:  Novolog 3-6-9 units q 4 hours D10 50 cc/ hr Inpatient Diabetes Program Recommendations:   Please consider reducing Novolog correction to sensitive q 4 hours.    Thanks  Beryl Meager, RN, BC-ADM Inpatient Diabetes Coordinator Pager [phone] (8a-5p)

## 2020-03-23 NOTE — Progress Notes (Signed)
Patient ID: Alex [name], male   DOB: 1971/02/16, 50 y.o.   MRN: [id number]    Advanced Heart Failure Rounding Note  PCP-Cardiologist: No primary care provider on file.   Subjective:    - 12/30: VV ECMO cannulation - 12/31: Left chest tube replaced - 1/2: Extubated. Echo with EF 60-65%, mildly dilated RV with mildly decreased systolic function.  - 1/4: Agitated, suspected aspiration.  Re-intubated.  - 1/5: ECMO cannula repositioned under TEE guidance. TEE showed moderately dilated/moderate-severely dysfunctional RV in setting of hypoxemia. LUE DVT found.  Patient got 1/2 dose of TPA due to initial concern for large PE.  - 1/6: Tracheostomy - 1/7: Echo with mild RV dilation/mild RV dysfunction.   Good diuresis with Lasix gtt 4 mg/hr.  CXR stable bilateral infiltrates.   Bivalirudin gtt PTT 66.   Off vancomycin, on cefepime.   Circuit functioning normally though fibrin in circuit and LDH higher today.   TFs off for ileus, on Reglan IV.   Stood up in bed, worked with PT.   ECMO parameters: 3225 rpm Flow 3.6 L/min Pvenous -45 Delta P 22 Sweep 2 ABG 7.44/51/107/98% LDH 410 => 543 => 728 => 1041 => 998 => 1305 => 934 => 959 => 1009 => 893 => 901 => 1078 Lactate 1.0  Objective:   Weight Range: 79.8 kg Body mass index is 30.2 kg/m.   Vital Signs:   Temp:  [97.88 F (36.6 C)-99 F (37.2 C)] 98.4 F (36.9 C) (01/11 0400) Pulse Rate:  [73-149] 92 (01/11 0700) Resp:  [0-44] 0 (01/11 0700) BP: (105-207)/(64-130) 113/73 (01/11 0700) SpO2:  [88 %-100 %] 99 % (01/11 0700) Arterial Line BP: (111-300)/(54-106) 130/60 (01/11 0700) FiO2 (%):  [50 %] 50 % (01/11 0430) Weight:  [79.8 kg] 79.8 kg (01/11 0500) Last BM Date: 03/20/20  Weight change: Filed Weights   03/21/20 0600 03/22/20 0700 03/23/20 0500  Weight: 78.5 kg 75.8 kg 79.8 kg    Intake/Output:   Intake/Output Summary (Last 24 hours) at 03/23/2020 0755 Last data filed at 03/23/2020 0700 Gross per 24 hour   Intake 4095.06 ml  Output 4820 ml  Net -724.94 ml      Physical Exam    General: NAD, awake on vent Neck: Tracheostomy. No JVD, no thyromegaly or thyroid nodule.  Lungs: Clear to auscultation bilaterally with normal respiratory effort. CV: Nondisplaced PMI.  Heart regular S1/S2, no S3/S4, no murmur.  No peripheral edema.   Abdomen: Soft, nontender, no hepatosplenomegaly, no distention.  Skin: Intact without lesions or rashes.  Neurologic: Alert and oriented x 3.  Psych: Normal affect. Extremities: No clubbing or cyanosis.  HEENT: Normal.    Telemetry   NSR 110s (personally reviewed)  Labs    CBC Recent Labs    03/22/20 1700 03/22/20 1958 03/23/20 0409 03/23/20 0424  WBC 15.7*  --   --  9.5  HGB 9.8*   < > 9.2* 9.2*  HCT 30.3*   < > 27.0* 28.3*  MCV 92.7  --   --  93.1  PLT 138*  --   --  126*   < > = values in this interval not displayed.   Basic Metabolic Panel Recent Labs    03/21/20 1624 03/21/20 1634 03/22/20 0324 03/22/20 0604 03/22/20 1700 03/22/20 1958 03/23/20 0409 03/23/20 0424  NA 135   < > 136   < > 130*   < > 132* 132*  K 5.1   < > 3.8   < > 4.7   < >  3.8 3.8  CL 91*  --  90*  --  85*  --   --  87*  CO2 33*  --  36*  --  34*  --   --  34*  GLUCOSE 106*  --  54*  --  130*  --   --  61*  BUN 32*  --  30*  --  27*  --   --  24*  CREATININE 0.94  --  0.86  --  0.86  --   --  0.76  CALCIUM 7.8*  --  7.9*  --  8.0*  --   --  8.0*  MG 1.9  --  1.9  --   --   --   --  2.1  PHOS 4.3  --  5.1*  --   --   --   --   --    < > = values in this interval not displayed.   Liver Function Tests Recent Labs    03/22/20 0324 03/23/20 0424  AST 48* 67*  ALT 33 35  ALKPHOS 81 122  BILITOT 1.7* 1.7*  PROT 5.2* 5.2*  ALBUMIN 3.0* 3.0*   No results for input(s): LIPASE, AMYLASE in the last 72 hours. Cardiac Enzymes No results for input(s): CKTOTAL, CKMB, CKMBINDEX, TROPONINI in the last 72 hours.  BNP: BNP (last 3 results) No results for input(s):  BNP in the last 8760 hours.  ProBNP (last 3 results) No results for input(s): PROBNP in the last 8760 hours.   D-Dimer No results for input(s): DDIMER in the last 72 hours. Hemoglobin A1C No results for input(s): HGBA1C in the last 72 hours. Fasting Lipid Panel No results for input(s): CHOL, HDL, LDLCALC, TRIG, CHOLHDL, LDLDIRECT in the last 72 hours. Thyroid Function Tests No results for input(s): TSH, T4TOTAL, T3FREE, THYROIDAB in the last 72 hours.  Invalid input(s): FREET3  Other results:   Imaging    DG CHEST PORT 1 VIEW  Result Date: 03/23/2020 CLINICAL DATA:  50 year old male with COVID-19.  ARDS.  ECMO. EXAM: PORTABLE CHEST 1 VIEW COMPARISON:  Portable chest 03/22/2020 and earlier. FINDINGS: Portable AP semi upright view at 0542 hours. Tracheostomy tube in place with no adverse features. Enteric feeding tube courses to the abdomen, tip not included. Stable right medial chest ECMO catheter. Stable left IJ approach central line. Stable left chest tube. Continued widespread bilateral pulmonary opacity. There are less hilar air bronchograms compared to yesterday. No pneumothorax or large pleural effusion is evident. Most mediastinal contours remain obscured. Ventilation in the upper lungs has improved since 03/21/2020. IMPRESSION: 1.  Stable lines and tubes.  No pneumothorax. 2. Diffuse bilateral pneumonia/ARDS with mildly improved upper lung ventilation since 03/21/2020. Electronically Signed   By: Genevie Ann M.D.   On: 03/23/2020 06:36   DG Abd Portable 1V  Result Date: 03/22/2020 CLINICAL DATA:  COVID positive on ECMO with abdominal distension. EXAM: PORTABLE ABDOMEN - 1 VIEW COMPARISON:  03/18/2020. FINDINGS: Nasogastric tube terminates at the pylorus or duodenal bulb. ECMO catheter is seen in the upper IVC. There is mild gaseous distension of colon. No small bowel dilatation. Rectal temperature probe is in place. No unexpected calculi. IMPRESSION: Mild gaseous distension of colon  can be seen with a colonic ileus. Electronically Signed   By: Lorin Picket M.D.   On: 03/22/2020 10:51     Medications:     Scheduled Medications: . sodium chloride   Intravenous Once  . chlorhexidine gluconate (MEDLINE KIT)  15 mL Mouth Rinse BID  . Chlorhexidine Gluconate Cloth  6 each Topical Daily  . clonazePAM  1.5 mg Per Tube Q6H  . cloNIDine  0.2 mg Per Tube Q8H  . docusate  100 mg Per Tube BID  . free water  60 mL Per Tube Q8H  . guaiFENesin-dextromethorphan  10 mL Per Tube BID  . insulin aspart  3-9 Units Subcutaneous Q4H  . living well with diabetes book   Does not apply Once  . mouth rinse  15 mL Mouth Rinse 10 times per day  . metoCLOPramide (REGLAN) injection  5 mg Intravenous Q8H  . metoprolol tartrate  25 mg Per Tube BID  . midazolam  5 mg Intravenous Once  . neostigmine  0.25 mg Subcutaneous Q6H  . oxyCODONE  20 mg Per Tube Q6H  . pantoprazole sodium  40 mg Per Tube QHS  . polyethylene glycol  17 g Per Tube BID  . predniSONE  30 mg Per Tube Q breakfast   Followed by  . [START ON 03/26/2020] predniSONE  20 mg Per Tube Q breakfast   Followed by  . [START ON 03/29/2020] predniSONE  10 mg Per Tube Q breakfast  . QUEtiapine  150 mg Per Tube TID  . sennosides  10 mL Per Tube BID  . sodium chloride flush  10-40 mL Intracatheter Q12H    Infusions: . sodium chloride    . sodium chloride Stopped (03/23/20 0527)  . sodium chloride Stopped (03/21/20 0827)  . sodium chloride Stopped (03/12/20 0131)  . albumin human Stopped (03/22/20 1900)  . bivalirudin (ANGIOMAX) infusion 0.5 mg/mL (Non-ACS indications) 0.27 mg/kg/hr (03/23/20 0600)  . ceFEPime (MAXIPIME) IV 2 g (03/23/20 0114)  . dexmedetomidine (PRECEDEX) IV infusion 1.3 mcg/kg/hr (03/23/20 0341)  . dextrose 50 mL/hr at 03/23/20 0600  . feeding supplement (PIVOT 1.5 CAL) Stopped (03/21/20 0800)  . fentaNYL infusion INTRAVENOUS 150 mcg/hr (03/23/20 0115)  . furosemide (LASIX) 200 mg in dextrose 5% 100 mL (16m/mL)  infusion 4 mg/hr (03/23/20 0600)  . potassium chloride 10 mEq (03/23/20 0629)    PRN Medications: Place/Maintain arterial line **AND** sodium chloride, sodium chloride, sodium chloride, acetaminophen (TYLENOL) oral liquid 160 mg/5 mL, albumin human, chlorpheniramine-HYDROcodone, dextrose, fentaNYL (SUBLIMAZE) injection, guaiFENesin, haloperidol lactate, hydrALAZINE, labetalol, lip balm, LORazepam, ondansetron (ZOFRAN) IV, oxyCODONE, sodium chloride flush   Assessment/Plan   1. Acute hypoxemic respiratory failure: Due to COVID-19 PNA with bilateral infiltrates.  Refractory hypoxemia, VV-ECMO cannulation on 02/11/2020 with improvement in oxygenation.  Developed left PTX post-subclavian CVL and has left chest tube, the left lung is re-expanded.  He was extubated 1/2 but reintubated 1/4 with agitation and suspected aspiration.  Tracheostomy 1/6.  CXR with bilateral infiltrates, improved today.  He is now on cefepime for empiric abx coverage.  ECMO cannula repositioned 1/5.  LDH mildly higher today but circuit functioning normally.   - ECMO circuit functioning appropriately.  - Continue bivalirudin, goal PTT 60-80.  - Patient has had remdesivir, tocilizumab. - Ongoing steroids with prednisone.  - Continue cefepime 1 more day.    - Conitnue Lasix to 4 mg/hr, aim for even to slightly negative.  2. RLE DVT/LUE DVT/thrombus in RV/suspect PE: Echo with moderately dilated and moderately dysfunctional RV.  Clot noted on TEE in RV as well.  TTE 1/2 showed normal EF 60-65%, RV improved (mildly dilated/dysfunctional). TEE on 1/5 with moderate to severe RV dysfunction but patient was hypoxemic.  Had 1/2 dose TPA on 1/5. Echo 1/7 with mildly dilated/mildly dysfunctional  RV.  - Bivalirudin for goal PTT 60-80.   3. Left PTX: Left chest tube, lung is re-expanded.   4. Shock: Suspect septic/distributive.  Now resolved, off NE.  5. Anemia: Hgb 9.2, transfuse < 8.   6. AKI: Resolved.  7. Hyperglycemia: insulin.  8.  HTN: Following cuff pressure (whip in ABG). 9. CHB: Episode of CHB when hypoxemic.  NSR since then.  - With elevated HR, will start metoprolol 25 mg bid but follow closely.  10. Thrombocytopenia: Mild, stable. 11. Ileus: TFs on hold.  - Getting Reglan - Add neostigmine.   CRITICAL CARE Performed by: Loralie Champagne  Total critical care time: 40 minutes  Critical care time was exclusive of separately billable procedures and treating other patients.  Critical care was necessary to treat or prevent imminent or life-threatening deterioration.  Critical care was time spent personally by me on the following activities: development of treatment plan with patient and/or surrogate as well as nursing, discussions with consultants, evaluation of patient's response to treatment, examination of patient, obtaining history from patient or surrogate, ordering and performing treatments and interventions, ordering and review of laboratory studies, ordering and review of radiographic studies, pulse oximetry and re-evaluation of patient's condition.    Length of Stay: [place], MD  03/23/2020, 7:55 AM  Advanced Heart Failure Team Pager [phone] (M-F; 7a - 4p)  Please contact [facility] for night-coverage after hours (4p -7a ) and weekends on [email or web address]

## 2020-03-23 NOTE — Progress Notes (Signed)
ECMO PROGRESS NOTE  NAME:  Alex [name], MRN:  [id number], DOB:  1970-06-20, LOS: 14 ADMISSION DATE:  Mar 22, 2020, CONSULTATION DATE: 03/05/2020 REFERRING MD: Wynona Neat -LBPCCM, CHIEF COMPLAINT: Respiratory failure requiring ECMO  HPI/course in hospital  50 year old man admitted to hospital 12/28 with 1 week history of dyspnea cough nausea and vomiting.  Initially admitted to [facility] long hospital and placed on high flow nasal cannula but rapidly failed and required intubation 12/29.  Persistent hypoxic respiratory failure with PF ratio 55 in spite of 18 of PEEP FiO2 0.1 despite paralytics.  Did not improve with prone ventilation  Cannulated for VV ECMO 12/30 via right IJ crescent cannula. Iatrogenic pneumothorax from left subclavian triple-lumen placement  Family confirms no significant past medical history apart from possible prediabetes  Past Medical History  none  Interim history/subjective:  1/11: awake and able to communicate. Able to wean to 2 sweep. Still with good diuresis. Able to speak with in line speaking valve.  1/10: still on escalated sedation. Will increase po to wean continuous, wbc normal. Lactate normal. Will stop vanc (neg pcr)  Objective   Blood pressure (!) 155/90, pulse (!) 113, temperature 98.4 F (36.9 C), temperature source Core, resp. rate (!) 41, height 5\' 4"  (1.626 m), weight 79.8 kg, SpO2 (!) 89 %.    Vent Mode: PCV FiO2 (%):  [50 %] 50 % Set Rate:  [10 bmp] 10 bmp PEEP:  [10 cmH20] 10 cmH20 Plateau Pressure:  [21 cmH20-38 cmH20] 38 cmH20   Intake/Output Summary (Last 24 hours) at 03/23/2020 1450 Last data filed at 03/23/2020 1308 Gross per 24 hour  Intake 3873.25 ml  Output 4600 ml  Net -726.75 ml   Filed Weights   03/21/20 0600 03/22/20 0700 03/23/20 0500  Weight: 78.5 kg 75.8 kg 79.8 kg    ECMO Device: Cardiohelp  ECMO Mode: VV  Flow (LPM): 3.59   Examination: General: critically ill appearing man laying in bed in NAD, on MV and  ECMO HEENT: [place]/AT, eyes anicteric, oral mucosa moist. Cortrak left nare. Neck: RIJ ECMO cannula, trach w/o bleeding. Cardio: S1S2, RRR Respiratory: breathing comfortably, tachypnea but controlled WOB. VT ~300cc Abdomen distended, soft and nontender, tympanitic to percussion Extremities: left hand with cyanotic digits x 5,purple toe left first toe, R radial Aline. Pitting edema. Derm : no rashes or wounds Neuro: RASS -2, follows commands when sedation lightened    CXR personally reviewed- dense infiltrates but stable from yesterday, trach and ECMO cannnula in appropriate positions  Ancillary tests (personally reviewed)  CBC: Recent Labs  Lab 03/21/20 0337 03/21/20 0346 03/21/20 1624 03/21/20 1634 03/22/20 0324 03/22/20 0604 03/22/20 1700 03/22/20 1958 03/23/20 0409 03/23/20 0424 03/23/20 0801 03/23/20 1016  WBC 11.5*  --  15.0*  --  9.5  --  15.7*  --   --  9.5  --   --   HGB 7.7*   < > 10.0*   < > 9.3*   < > 9.8* 10.9* 9.2* 9.2* 10.2* 9.9*  HCT 24.2*   < > 29.5*   < > 27.7*   < > 30.3* 32.0* 27.0* 28.3* 30.0* 29.0*  MCV 92.4  --  90.2  --  91.7  --  92.7  --   --  93.1  --   --   PLT 114*  --  130*  --  121*  --  138*  --   --  126*  --   --    < > =  values in this interval not displayed.    Basic Metabolic Panel: Recent Labs  Lab 03/17/20 0349 03/17/20 0904 03/21/20 0337 03/21/20 0346 03/21/20 1624 03/21/20 1634 03/22/20 0324 03/22/20 0604 03/22/20 1700 03/22/20 1958 03/23/20 0409 03/23/20 0424 03/23/20 0801 03/23/20 1016  NA 144  146*   < > 136   < > 135   < > 136   < > 130* 131* 132* 132* 130* 129*  K 3.8  3.9   < > 3.7   < > 5.1   < > 3.8   < > 4.7 4.6 3.8 3.8 4.1 4.2  CL 106   < > 93*  --  91*  --  90*  --  85*  --   --  87*  --   --   CO2 29   < > 32  --  33*  --  36*  --  34*  --   --  34*  --   --   GLUCOSE 156*   < > 133*  --  106*  --  54*  --  130*  --   --  61*  --   --   BUN 37*   < > 37*  --  32*  --  30*  --  27*  --   --  24*  --   --    CREATININE 0.84   < > 0.97  --  0.94  --  0.86  --  0.86  --   --  0.76  --   --   CALCIUM 7.3*   < > 7.6*  --  7.8*  --  7.9*  --  8.0*  --   --  8.0*  --   --   MG 2.8*  --   --   --  1.9  --  1.9  --   --   --   --  2.1  --   --   PHOS 2.4*  --   --   --  4.3  --  5.1*  --   --   --   --   --   --   --    < > = values in this interval not displayed.   GFR: Estimated Creatinine Clearance: 108.8 mL/min (by C-G formula based on SCr of 0.76 mg/dL). Recent Labs  Lab 03/21/20 1624 03/22/20 0324 03/22/20 1700 03/23/20 0424  WBC 15.0* 9.5 15.7* 9.5  LATICACIDVEN 1.3 0.7 1.6 1.0    Liver Function Tests: Recent Labs  Lab 03/19/20 0322 03/20/20 0408 03/21/20 0337 03/22/20 0324 03/23/20 0424  AST 41 43* 46* 48* 67*  ALT 37 32 31 33 35  ALKPHOS 83 98 85 81 122  BILITOT 1.4* 1.2 1.3* 1.7* 1.7*  PROT 4.7* 4.7* 4.9* 5.2* 5.2*  ALBUMIN 2.9* 2.7* 3.2* 3.0* 3.0*   No results for input(s): LIPASE, AMYLASE in the last 168 hours. No results for input(s): AMMONIA in the last 168 hours.  ABG    Component Value Date/Time   PHART 7.397 03/23/2020 1016   PCO2ART 59.3 (H) 03/23/2020 1016   PO2ART 79 (L) 03/23/2020 1016   HCO3 36.5 (H) 03/23/2020 1016   TCO2 38 (H) 03/23/2020 1016   ACIDBASEDEF 1.0 03/17/2020 1608   O2SAT 95.0 03/23/2020 1016     Coagulation Profile: Recent Labs  Lab 03/19/20 0322 03/20/20 0408 03/21/20 0337 03/22/20 0324 03/23/20 0424  INR 1.9* 1.8* 1.9* 1.9* 1.8*    Cardiac Enzymes:  No results for input(s): CKTOTAL, CKMB, CKMBINDEX, TROPONINI in the last 168 hours.  HbA1C: Hgb A1c MFr Bld  Date/Time Value Ref Range Status  03/02/2020 06:10 PM 11.8 (H) 4.8 - 5.6 % Final    Comment:    (NOTE) Pre diabetes:          5.7%-6.4%  Diabetes:              >6.4%  Glycemic control for   <7.0% adults with diabetes   02/20/2020 08:30 AM 12.2 (H) 4.8 - 5.6 % Final    Comment:    (NOTE) Pre diabetes:          5.7%-6.4%  Diabetes:               >6.4%  Glycemic control for   <7.0% adults with diabetes     CBG: Recent Labs  Lab 03/23/20 0406 03/23/20 0428 03/23/20 0759 03/23/20 1129 03/23/20 1130  GLUCAP 61* 169* 112* 201* 194*     Assessment & Plan:   Acute hypoxic and hypercarbic respiratory failure requiring VV ECMO support and mechanical ventilation. ARDS due to COVID-19 viral pneumonia Pneumothorax on left Bilateral DVT , cardiac clot in transit. Small PE but hemodynamically stable. Concern for RLL pneumonia -Con't ultralung protective ventilation. Ok to cont PMV trials on vent with SLP. WOB likely too uncontrolled to start TCTs. -Con't full ECMO support, weaning as tolerated. -Con't lasix infusion  -Con't chest tube to suction -Routine trach care -Complete courses of remdesivir, Actemra. -steroid taper in -Completed empiric courses of antibiotics for community-acquired pneumonia. S/p 8 days vanc so will stop today.  -cefepime for 10 days -Will need 3 months of anticoagulation for provoked thromboembolic disease. -increasing seroquel and klonopin to help dyspnea and anxiety to work on coming off infusions. Con't precedex and fentanyl gtt PRN. Con't enteral oxycodone. -con't steroid taper  Agitation-improved now back on mechanical ventilation. -Continue Precedex infusion; limit as possible. cont clonidine 0.2 Q8h -Con't Ativan 0.5 every 4 hours as needed -Seroquel, qtc ok  -Con't oxycodone  -increase Klonopin to 1.5 mg Q6h -Haldol as needed  dm2 with hyperglycemia PTA (A1c 12.2) -hypoglycemia overnight 2/2 holding tf - holding levemir with holding of tf -goal BG 140-180 -steroid wean  Acute anemia; suspect hemolytic. LDH high Thrombocytopenia; remains stable -Transfuse for hemoglobin less than 8 or hemodynamically significant bleeding. 1 unit pRBCs overnight. -Continue to monitor platelets   Leukocytosis-Concern for right lower lobe pneumonia, likely bacteremia -blood cx with variant strains of  staph, completed 8 days vanc -cefepime for 10 days today.  -sputum negative  Complete heart block; associated with hypoxia due to cannula positioning issues -Resolved -Continue to hold metoprolol  FEN -holding tube feeds ileus -cont reglan and bowel regimen -otherwise bowel rest.  -hold on og placement for ilws  Deconditioning -cont PT, OT, SLP -motivated  Daily Goals Checklist  Pain/Anxiety/Delirium protocol (if indicated): enteral oxycodone and clonazepam, precedex, seroquel, clonidine VAP protocol (if indicated): extubated DVT prophylaxis: Systemic bivalirudin Nutritional status and feeding goals: High nutritional risk, tube feeds via core track GI prophylaxis: Pantoprazole Urinary catheter: Assessment of intravascular volume Central lines: Right IJ crescent, left subclavian triple-lumen, right radial arterial line Glucose control:  Monitoring until resumption of tf Mobility/therapy needs: progressive mobility Code Status: Full Family Communication: will update son this afternoon Last GoC meeting 03/18/19 with brother, son, friend> con't aggressive care  Multidisciplinary ECMO rounds with cardiology, pharmacy, RNs, ECMO specialists, and PCCM.  Critical care time: The patient is critically ill  with multiple organ systems failure and requires high complexity decision making for assessment and support, frequent evaluation and titration of therapies, application of advanced monitoring technologies and extensive interpretation of multiple databases.  Critical care time 33 mins. This represents my time independent of the NPs time taking care of the pt. This is excluding procedures.    Briant SitesJessica Tamie Minteer DO [facility] 03/23/2020, 2:50 PM

## 2020-03-23 NOTE — Progress Notes (Signed)
Pill crusher with 20mg  of oxycodone, 1.5mg  of klonopin,  and clonidine broke into pieces whiles crushing pills. Crushed meds wasted in the presence of  RN 

## 2020-03-23 NOTE — Progress Notes (Signed)
ANTICOAGULATION CONSULT NOTE  Pharmacy Consult for Bivalirudin Indication: ECMO + DVTs  No Known Allergies  Patient Measurements: Height: 5\' 4"  (162.6 cm) Weight: 79.8 kg (175 lb 14.8 oz) (weight on Kreg bed that pt was placed on yesterday. Wt with Zflow pillow, one blanket) IBW/kg (Calculated) : 59.2 Heparin Dosing Weight: 72kg  Vital Signs: Temp: 98.4 F (36.9 C) (01/11 0400) Temp Source: Oral (01/11 0000) BP: 155/78 (01/11 0500) Pulse Rate: 112 (01/11 0544)  Labs: Recent Labs    03/21/20 0337 03/21/20 0346 03/22/20 0324 03/22/20 0604 03/22/20 1700 03/22/20 1958 03/23/20 0409 03/23/20 0424  HGB 7.7*   < > 9.3*   < > 9.8* 10.9* 9.2* 9.2*  HCT 24.2*   < > 27.7*   < > 30.3* 32.0* 27.0* 28.3*  PLT 114*   < > 121*  --  138*  --   --  126*  APTT 73*   < > 81*  --  70*  --   --  68*  LABPROT 20.8*  --  20.8*  --   --   --   --  20.0*  INR 1.9*  --  1.9*  --   --   --   --  1.8*  CREATININE 0.97   < > 0.86  --  0.86  --   --  0.76   < > = values in this interval not displayed.    Estimated Creatinine Clearance: 108.8 mL/min (by C-G formula based on SCr of 0.76 mg/dL).   Medical History: History reviewed. No pertinent past medical history.   Assessment: 11 yoM admitted with COVID-19 PNA with worsening hypoxia, s/p cannulation for ECMO. Pt was started on IV heparin prior to cannulation due to acute DVTs and possible PE,  transitioned to bivalirudin with ECMO. Now s/p tPA on 1/5 and tracheostomy on 1/6.   APTT remains at goal at 68 seconds, CBC stable, LDH >1000.  Goal of Therapy:  APTT 60-80 seconds - new goal 1/8 Monitor platelets by anticoagulation protocol: Yes   Plan:  -Continue bivalirudin to 0.27 mg/kg/hr  -q12h CBC, aptt   3/6, PharmD, BCPS, [facility] Clinical Pharmacist [phone] Please check AMION for all [facility] numbers 03/23/2020

## 2020-03-23 NOTE — Progress Notes (Incomplete)
Nutrition Follow-up  DOCUMENTATION CODES:   Not applicable  INTERVENTION:  ***   NUTRITION DIAGNOSIS:   Increased nutrient needs related to acute illness,catabolic illness (COVID-19 infection) as evidenced by estimated needs.  ***  GOAL:   Patient will meet greater than or equal to 90% of their needs  ***  MONITOR:   Vent status,TF tolerance,Labs,Weight trends  REASON FOR ASSESSMENT:   LOS Enteral/tube feeding initiation and management  ASSESSMENT:   50 y.o. male with no significant medical history. He presented to the ED with dyspnea, cough, and N/V. He reported that his symptoms began with cough 1 week ago. He tried OTC meds but nothing helped. Symptoms worsened to include body aches, fatigue, and N/V 3 days later. He went to his PCP 12/26 and got tested for COVID; he was positive.  ***  Labs: sodium 130 (L) Meds:   NUTRITION - FOCUSED PHYSICAL EXAM:  {RD Focused Exam List:21252}  Diet Order:   Diet Order            Diet NPO time specified  Diet effective midnight                 EDUCATION NEEDS:   Not appropriate for education at this time  Skin:  Skin Assessment: Reviewed RN Assessment  Last BM:  1/6 rectal tube  Height:   Ht Readings from Last 1 Encounters:  03/08/2020 5\' 4"  (1.626 m)    Weight:   Wt Readings from Last 1 Encounters:  03/23/20 79.8 kg    Ideal Body Weight:  59.1 kg  BMI:  Body mass index is 30.2 kg/m.  Estimated Nutritional Needs:   Kcal:  2160-2520 kcals  Protein:  130-150 g  Fluid:  >/= 2 L    05-30-1992 MS, RDN, LDN, CNSC Registered Dietitian III Clinical Nutrition RD Pager and On-Call Pager Number Located in [place]

## 2020-03-23 NOTE — Procedures (Signed)
Extracorporeal support note  ECLS cannulation date: 12/30 ECLS support day: 13 Last circuit change: n/a  Indication: Severe respiratory failure secondary to COVID-19 pneumonia with RV dysfunction  Configuration: Venovenous  Drainage cannula: 32 French crescent cannula via right IJ Return cannula: Same  Pump speed: 3000 RPM Pump flow: 3.8 L/min Pump used: Cardio help  Oxygenator: Cardio help O2 blender: 100% Sweep gas: 2L  Circuit check: clot at corners, especially the left- stable Anticoagulant: Bivalirudin Anticoagulation targets: PTT 60-80  Changes in support: Con't current ECMO support. Remains on MV, anxiety somewhat stabilizing. Cont po agents. Ileus on imaging so cont reglan. Neostigmine per HF  Anticipated goals/duration of support: Bridge to recovery.  Multidisciplinary ECMO rounds completed.   Briant Sites, DO 03/23/20 2:49 PM [facility]

## 2020-03-23 NOTE — Progress Notes (Signed)
Hypoglycemic Event  CBG: 61  Treatment: 73ml d50  Symptoms: None  Follow-up CBG: Time:0426 CBG Result:164  Possible Reasons for Event: NPO  Comments/MD notified:protocol    Amo Kuffour, Alinda Money

## 2020-03-24 ENCOUNTER — Inpatient Hospital Stay (HOSPITAL_COMMUNITY): Payer: Medicaid Other

## 2020-03-24 LAB — POCT I-STAT 7, (LYTES, BLD GAS, ICA,H+H)
Acid-Base Excess: 12 mmol/L — ABNORMAL HIGH (ref 0.0–2.0)
Acid-Base Excess: 11 mmol/L — ABNORMAL HIGH (ref 0.0–2.0)
Acid-Base Excess: 9 mmol/L — ABNORMAL HIGH (ref 0.0–2.0)
Acid-Base Excess: 14 mmol/L — ABNORMAL HIGH (ref 0.0–2.0)
Acid-Base Excess: 13 mmol/L — ABNORMAL HIGH (ref 0.0–2.0)
Acid-Base Excess: 13 mmol/L — ABNORMAL HIGH (ref 0.0–2.0)
Acid-Base Excess: 14 mmol/L — ABNORMAL HIGH (ref 0.0–2.0)
Acid-Base Excess: 15 mmol/L — ABNORMAL HIGH (ref 0.0–2.0)
Bicarbonate: 38.3 mmol/L — ABNORMAL HIGH (ref 20.0–28.0)
Bicarbonate: 38.2 mmol/L — ABNORMAL HIGH (ref 20.0–28.0)
Bicarbonate: 36.1 mmol/L — ABNORMAL HIGH (ref 20.0–28.0)
Bicarbonate: 40.6 mmol/L — ABNORMAL HIGH (ref 20.0–28.0)
Bicarbonate: 39.6 mmol/L — ABNORMAL HIGH (ref 20.0–28.0)
Bicarbonate: 39.9 mmol/L — ABNORMAL HIGH (ref 20.0–28.0)
Bicarbonate: 40.4 mmol/L — ABNORMAL HIGH (ref 20.0–28.0)
Bicarbonate: 41.5 mmol/L — ABNORMAL HIGH (ref 20.0–28.0)
Calcium, Ion: 1.1 mmol/L — ABNORMAL LOW (ref 1.15–1.40)
Calcium, Ion: 1.09 mmol/L — ABNORMAL LOW (ref 1.15–1.40)
Calcium, Ion: 1.12 mmol/L — ABNORMAL LOW (ref 1.15–1.40)
Calcium, Ion: 1.11 mmol/L — ABNORMAL LOW (ref 1.15–1.40)
Calcium, Ion: 1.09 mmol/L — ABNORMAL LOW (ref 1.15–1.40)
Calcium, Ion: 1.1 mmol/L — ABNORMAL LOW (ref 1.15–1.40)
Calcium, Ion: 1.13 mmol/L — ABNORMAL LOW (ref 1.15–1.40)
Calcium, Ion: 1.1 mmol/L — ABNORMAL LOW (ref 1.15–1.40)
HCT: 27 % — ABNORMAL LOW (ref 39.0–52.0)
HCT: 28 % — ABNORMAL LOW (ref 39.0–52.0)
HCT: 28 % — ABNORMAL LOW (ref 39.0–52.0)
HCT: 28 % — ABNORMAL LOW (ref 39.0–52.0)
HCT: 27 % — ABNORMAL LOW (ref 39.0–52.0)
HCT: 27 % — ABNORMAL LOW (ref 39.0–52.0)
HCT: 27 % — ABNORMAL LOW (ref 39.0–52.0)
HCT: 27 % — ABNORMAL LOW (ref 39.0–52.0)
Hemoglobin: 9.2 g/dL — ABNORMAL LOW (ref 13.0–17.0)
Hemoglobin: 9.5 g/dL — ABNORMAL LOW (ref 13.0–17.0)
Hemoglobin: 9.5 g/dL — ABNORMAL LOW (ref 13.0–17.0)
Hemoglobin: 9.5 g/dL — ABNORMAL LOW (ref 13.0–17.0)
Hemoglobin: 9.2 g/dL — ABNORMAL LOW (ref 13.0–17.0)
Hemoglobin: 9.2 g/dL — ABNORMAL LOW (ref 13.0–17.0)
Hemoglobin: 9.2 g/dL — ABNORMAL LOW (ref 13.0–17.0)
Hemoglobin: 9.2 g/dL — ABNORMAL LOW (ref 13.0–17.0)
O2 Saturation: 97 %
O2 Saturation: 100 %
O2 Saturation: 95 %
O2 Saturation: 95 %
O2 Saturation: 95 %
O2 Saturation: 60 %
O2 Saturation: 95 %
O2 Saturation: 97 %
Patient temperature: 99
Patient temperature: 37.6
Patient temperature: 98.9
Patient temperature: 36.7
Patient temperature: 97.8
Patient temperature: 37.1
Patient temperature: 37.1
Potassium: 3.9 mmol/L (ref 3.5–5.1)
Potassium: 3.9 mmol/L (ref 3.5–5.1)
Potassium: 3.9 mmol/L (ref 3.5–5.1)
Potassium: 3.7 mmol/L (ref 3.5–5.1)
Potassium: 3.7 mmol/L (ref 3.5–5.1)
Potassium: 3.6 mmol/L (ref 3.5–5.1)
Potassium: 4.3 mmol/L (ref 3.5–5.1)
Potassium: 3.6 mmol/L (ref 3.5–5.1)
Sodium: 130 mmol/L — ABNORMAL LOW (ref 135–145)
Sodium: 128 mmol/L — ABNORMAL LOW (ref 135–145)
Sodium: 129 mmol/L — ABNORMAL LOW (ref 135–145)
Sodium: 127 mmol/L — ABNORMAL LOW (ref 135–145)
Sodium: 128 mmol/L — ABNORMAL LOW (ref 135–145)
Sodium: 127 mmol/L — ABNORMAL LOW (ref 135–145)
Sodium: 127 mmol/L — ABNORMAL LOW (ref 135–145)
Sodium: 126 mmol/L — ABNORMAL LOW (ref 135–145)
TCO2: 40 mmol/L — ABNORMAL HIGH (ref 22–32)
TCO2: 40 mmol/L — ABNORMAL HIGH (ref 22–32)
TCO2: 38 mmol/L — ABNORMAL HIGH (ref 22–32)
TCO2: 43 mmol/L — ABNORMAL HIGH (ref 22–32)
TCO2: 41 mmol/L — ABNORMAL HIGH (ref 22–32)
TCO2: 42 mmol/L — ABNORMAL HIGH (ref 22–32)
TCO2: 42 mmol/L — ABNORMAL HIGH (ref 22–32)
TCO2: 43 mmol/L — ABNORMAL HIGH (ref 22–32)
pCO2 arterial: 61.5 mmHg — ABNORMAL HIGH (ref 32.0–48.0)
pCO2 arterial: 72.6 mmHg (ref 32.0–48.0)
pCO2 arterial: 61.6 mmHg — ABNORMAL HIGH (ref 32.0–48.0)
pCO2 arterial: 65.4 mmHg (ref 32.0–48.0)
pCO2 arterial: 59.4 mmHg — ABNORMAL HIGH (ref 32.0–48.0)
pCO2 arterial: 62.3 mmHg — ABNORMAL HIGH (ref 32.0–48.0)
pCO2 arterial: 64.7 mmHg — ABNORMAL HIGH (ref 32.0–48.0)
pCO2 arterial: 64.3 mmHg — ABNORMAL HIGH (ref 32.0–48.0)
pH, Arterial: 7.403 (ref 7.350–7.450)
pH, Arterial: 7.332 — ABNORMAL LOW (ref 7.350–7.450)
pH, Arterial: 7.376 (ref 7.350–7.450)
pH, Arterial: 7.399 (ref 7.350–7.450)
pH, Arterial: 7.43 (ref 7.350–7.450)
pH, Arterial: 7.404 (ref 7.350–7.450)
pH, Arterial: 7.415 (ref 7.350–7.450)
pH, Arterial: 7.418 (ref 7.350–7.450)
pO2, Arterial: 94 mmHg (ref 83.0–108.0)
pO2, Arterial: 399 mmHg — ABNORMAL HIGH (ref 83.0–108.0)
pO2, Arterial: 81 mmHg — ABNORMAL LOW (ref 83.0–108.0)
pO2, Arterial: 76 mmHg — ABNORMAL LOW (ref 83.0–108.0)
pO2, Arterial: 74 mmHg — ABNORMAL LOW (ref 83.0–108.0)
pO2, Arterial: 33 mmHg — CL (ref 83.0–108.0)
pO2, Arterial: 81 mmHg — ABNORMAL LOW (ref 83.0–108.0)
pO2, Arterial: 92 mmHg (ref 83.0–108.0)

## 2020-03-24 LAB — HEPATIC FUNCTION PANEL
ALT: 43 U/L (ref 0–44)
AST: 66 U/L — ABNORMAL HIGH (ref 15–41)
Albumin: 3 g/dL — ABNORMAL LOW (ref 3.5–5.0)
Alkaline Phosphatase: 157 U/L — ABNORMAL HIGH (ref 38–126)
Bilirubin, Direct: 0.4 mg/dL — ABNORMAL HIGH (ref 0.0–0.2)
Indirect Bilirubin: 1.7 mg/dL — ABNORMAL HIGH (ref 0.3–0.9)
Total Bilirubin: 2.1 mg/dL — ABNORMAL HIGH (ref 0.3–1.2)
Total Protein: 5.2 g/dL — ABNORMAL LOW (ref 6.5–8.1)

## 2020-03-24 LAB — BPAM RBC
Blood Product Expiration Date: 202201202359
Blood Product Expiration Date: 202201212359
Blood Product Expiration Date: 202201232359
Blood Product Expiration Date: 202201252359
Blood Product Expiration Date: 202201252359
ISSUE DATE / TIME: 202201090530
Unit Type and Rh: 7300
Unit Type and Rh: 7300
Unit Type and Rh: 7300
Unit Type and Rh: 7300
Unit Type and Rh: 7300

## 2020-03-24 LAB — CBC
HCT: 26.2 % — ABNORMAL LOW (ref 39.0–52.0)
HCT: 26.3 % — ABNORMAL LOW (ref 39.0–52.0)
Hemoglobin: 8.9 g/dL — ABNORMAL LOW (ref 13.0–17.0)
Hemoglobin: 9 g/dL — ABNORMAL LOW (ref 13.0–17.0)
MCH: 31.2 pg (ref 26.0–34.0)
MCH: 31.3 pg (ref 26.0–34.0)
MCHC: 34 g/dL (ref 30.0–36.0)
MCHC: 34.2 g/dL (ref 30.0–36.0)
MCV: 91.9 fL (ref 80.0–100.0)
MCV: 91.3 fL (ref 80.0–100.0)
Platelets: 127 10*3/uL — ABNORMAL LOW (ref 150–400)
Platelets: 126 10*3/uL — ABNORMAL LOW (ref 150–400)
RBC: 2.85 MIL/uL — ABNORMAL LOW (ref 4.22–5.81)
RBC: 2.88 MIL/uL — ABNORMAL LOW (ref 4.22–5.81)
RDW: 16.9 % — ABNORMAL HIGH (ref 11.5–15.5)
RDW: 16.6 % — ABNORMAL HIGH (ref 11.5–15.5)
WBC: 12 10*3/uL — ABNORMAL HIGH (ref 4.0–10.5)
WBC: 13.4 10*3/uL — ABNORMAL HIGH (ref 4.0–10.5)
nRBC: 0 % (ref 0.0–0.2)
nRBC: 0 % (ref 0.0–0.2)

## 2020-03-24 LAB — TYPE AND SCREEN
ABO/RH(D): B POS
Antibody Screen: NEGATIVE
Unit division: 0
Unit division: 0
Unit division: 0
Unit division: 0
Unit division: 0

## 2020-03-24 LAB — PROTIME-INR
INR: 1.9 — ABNORMAL HIGH (ref 0.8–1.2)
Prothrombin Time: 21.4 seconds — ABNORMAL HIGH (ref 11.4–15.2)

## 2020-03-24 LAB — BASIC METABOLIC PANEL
Anion gap: 10 (ref 5–15)
Anion gap: 13 (ref 5–15)
BUN: 23 mg/dL — ABNORMAL HIGH (ref 6–20)
BUN: 23 mg/dL — ABNORMAL HIGH (ref 6–20)
CO2: 33 mmol/L — ABNORMAL HIGH (ref 22–32)
CO2: 33 mmol/L — ABNORMAL HIGH (ref 22–32)
Calcium: 7.9 mg/dL — ABNORMAL LOW (ref 8.9–10.3)
Calcium: 8.3 mg/dL — ABNORMAL LOW (ref 8.9–10.3)
Chloride: 88 mmol/L — ABNORMAL LOW (ref 98–111)
Chloride: 83 mmol/L — ABNORMAL LOW (ref 98–111)
Creatinine, Ser: 0.83 mg/dL (ref 0.61–1.24)
Creatinine, Ser: 0.96 mg/dL (ref 0.61–1.24)
GFR, Estimated: >60 mL/min (ref >60)
GFR, Estimated: >60 mL/min (ref >60)
Glucose, Bld: 88 mg/dL (ref 70–99)
Glucose, Bld: 224 mg/dL — ABNORMAL HIGH (ref 70–99)
Potassium: 4 mmol/L (ref 3.5–5.1)
Potassium: 3.7 mmol/L (ref 3.5–5.1)
Sodium: 131 mmol/L — ABNORMAL LOW (ref 135–145)
Sodium: 129 mmol/L — ABNORMAL LOW (ref 135–145)

## 2020-03-24 LAB — GLUCOSE, CAPILLARY
Glucose-Capillary: 107 mg/dL — ABNORMAL HIGH (ref 70–99)
Glucose-Capillary: 140 mg/dL — ABNORMAL HIGH (ref 70–99)
Glucose-Capillary: 177 mg/dL — ABNORMAL HIGH (ref 70–99)
Glucose-Capillary: 225 mg/dL — ABNORMAL HIGH (ref 70–99)
Glucose-Capillary: 241 mg/dL — ABNORMAL HIGH (ref 70–99)
Glucose-Capillary: 91 mg/dL (ref 70–99)

## 2020-03-24 LAB — LACTATE DEHYDROGENASE: LDH: 1170 U/L — ABNORMAL HIGH (ref 98–192)

## 2020-03-24 LAB — LACTIC ACID, PLASMA
Lactic Acid, Venous: 0.7 mmol/L (ref 0.5–1.9)
Lactic Acid, Venous: 1.2 mmol/L (ref 0.5–1.9)

## 2020-03-24 LAB — APTT
aPTT: 82 seconds — ABNORMAL HIGH (ref 24–36)
aPTT: 90 seconds — ABNORMAL HIGH (ref 24–36)
aPTT: 70 seconds — ABNORMAL HIGH (ref 24–36)

## 2020-03-24 LAB — FIBRINOGEN: Fibrinogen: 521 mg/dL — ABNORMAL HIGH (ref 210–475)

## 2020-03-24 MED ORDER — METHYLNALTREXONE BROMIDE 12 MG/0.6ML ~~LOC~~ SOLN
12.0000 mg | Freq: Once | SUBCUTANEOUS | Status: AC
  Administered 2020-03-24: 12 mg via SUBCUTANEOUS
  Filled 2020-03-24: qty 0.6

## 2020-03-24 MED ORDER — SORBITOL 70 % SOLN
45.0000 mL | Freq: Once | Status: AC
  Administered 2020-03-24: 45 mL
  Filled 2020-03-24: qty 60

## 2020-03-24 MED ORDER — METOLAZONE 2.5 MG PO TABS
2.5000 mg | ORAL_TABLET | Freq: Once | ORAL | Status: AC
  Administered 2020-03-24: 2.5 mg
  Filled 2020-03-24: qty 1

## 2020-03-24 NOTE — Progress Notes (Addendum)
ANTICOAGULATION CONSULT NOTE - Follow Up Consult  Pharmacy Consult for bivalirudin Indication: ECMO and VTE  Labs: Recent Labs    03/22/20 0324 03/22/20 0604 03/22/20 1700 03/22/20 1958 03/23/20 0424 03/23/20 0801 03/23/20 1551 03/23/20 1600 03/23/20 1946 03/24/20 0435 03/24/20 0437  HGB 9.3*   < > 9.8*   < > 9.2*   < > 9.7* 9.9* 9.9* 9.2*  --   HCT 27.7*   < > 30.3*   < > 28.3*   < > 30.2* 29.0* 29.0* 27.0*  --   PLT 121*  --  138*  --  126*  --  148*  --   --   --   --   APTT 81*  --  70*  --  68*  --  71*  --   --   --  82*  LABPROT 20.8*  --   --   --  20.0*  --   --   --   --   --  21.4*  INR 1.9*  --   --   --  1.8*  --   --   --   --   --  1.9*  CREATININE 0.86  --  0.86  --  0.76  --  0.90  --   --   --  0.83   < > = values in this interval not displayed.    Assessment: 50yo male now supratherapeutic on bivalirudin; no gtt issues or signs of bleeding per RN.  Goal of Therapy:  aPTT 60-80 seconds   Plan:  Will decrease bivalirudin gtt by ~10% to 0.25 mg/kg/hr and check PTT with next scheduled labs.    Vernard Gambles, PharmD, BCPS  03/24/2020,6:15 AM

## 2020-03-24 NOTE — Progress Notes (Signed)
ECMO PROGRESS NOTE  NAME:  Alex [name], MRN:  [id number], DOB:  March 19, 1970, LOS: 15 ADMISSION DATE:  03/02/2020, CONSULTATION DATE: 02/11/2020 REFERRING MD: Wynona Neat -LBPCCM, CHIEF COMPLAINT: Respiratory failure requiring ECMO  HPI/course in hospital  50 year old man admitted to hospital 12/28 with 1 week history of dyspnea cough nausea and vomiting.  Initially admitted to [facility] long hospital and placed on high flow nasal cannula but rapidly failed and required intubation 12/29.  Persistent hypoxic respiratory failure with PF ratio 55 in spite of 18 of PEEP FiO2 0.1 despite paralytics.  Did not improve with prone ventilation  Cannulated for VV ECMO 12/30 via right IJ crescent cannula. Iatrogenic pneumothorax from left subclavian triple-lumen placement  Family confirms no significant past medical history apart from possible prediabetes  Past Medical History  none  Interim history/subjective:  1/12: awake communicative just stood multiple times and so is tachypneic and fatigues. Febrile this am to 99.1, turn down heater temp for now, pan cx but pt has been on broad abx with vanc just stopping Monday and cefepime ongoing. Will aos attempt to stop precedex as can contribute and if able to settle out will wean sweep and see. Circuit will likely need change in next few days if unable to come off.  1/11: awake and able to communicate. Able to wean to 2 sweep. Still with good diuresis. Able to speak with in line speaking valve.  1/10: still on escalated sedation. Will increase po to wean continuous, wbc normal. Lactate normal. Will stop vanc (neg pcr)  Objective   Blood pressure (!) 178/72, pulse (!) 118, temperature 99.1 F (37.3 C), temperature source Axillary, resp. rate (!) 34, height 5\' 4"  (1.626 m), weight 80.7 kg, SpO2 95 %.    Vent Mode: PCV FiO2 (%):  [50 %] 50 % Set Rate:  [10 bmp] 10 bmp PEEP:  [10 cmH20] 10 cmH20 Plateau Pressure:  [32 cmH20] 32 cmH20   Intake/Output  Summary (Last 24 hours) at 03/24/2020 1212 Last data filed at 03/24/2020 1100 Gross per 24 hour  Intake 4079.89 ml  Output 4055 ml  Net 24.89 ml   Filed Weights   03/22/20 0700 03/23/20 0500 03/24/20 0418  Weight: 75.8 kg 79.8 kg 80.7 kg    ECMO Device: Cardiohelp  ECMO Mode: VV  Flow (LPM): 4.45   Examination: General: critically ill appearing man laying in bed in NAD, on MV via trach and ECMO HEENT: Anchor/AT, eyes anicteric, oral mucosa moist. Cortrak left nare. Neck: RIJ ECMO cannula, trach w/o bleeding. Cardio: S1S2, RRR Respiratory: accessory muscle use and tachypnea (just stood) TV ~305-400 though Abdomen distended, but soft and nontender, tympanitic to percussion, bowel sounds ++++ Extremities: left hand with cyanotic digits x 2-3 (indesx finger with dry gangrene changes),purple toe left first toe, R radial Aline. Pitting edema diffusely (anasarca) with blistering as well Derm : no rashes or wounds Neuro: RASS -2, follows commands when sedation lightened    CXR personally reviewed- dense infiltrates but slightly improved from 1/11, trach and ECMO cannnula in appropriate positions  Ancillary tests (personally reviewed)  CBC: Recent Labs  Lab 03/22/20 0324 03/22/20 0604 03/22/20 1700 03/22/20 1958 03/23/20 0424 03/23/20 0801 03/23/20 1551 03/23/20 1600 03/23/20 1946 03/24/20 0435 03/24/20 0437 03/24/20 0844 03/24/20 1133  WBC 9.5  --  15.7*  --  9.5  --  17.3*  --   --   --  12.0*  --   --   HGB 9.3*   < >  9.8*   < > 9.2*   < > 9.7*   < > 9.9* 9.2* 8.9* 9.5* 9.5*  HCT 27.7*   < > 30.3*   < > 28.3*   < > 30.2*   < > 29.0* 27.0* 26.2* 28.0* 28.0*  MCV 91.7  --  92.7  --  93.1  --  93.2  --   --   --  91.9  --   --   PLT 121*  --  138*  --  126*  --  148*  --   --   --  127*  --   --    < > = values in this interval not displayed.    Basic Metabolic Panel: Recent Labs  Lab 03/21/20 1624 03/21/20 1634 03/22/20 0324 03/22/20 0604 03/22/20 1700 03/22/20 1958  03/23/20 0424 03/23/20 0801 03/23/20 1551 03/23/20 1600 03/23/20 1946 03/24/20 0435 03/24/20 0437 03/24/20 0844 03/24/20 1133  NA 135   < > 136   < > 130*   < > 132*   < > 129*   < > 128* 130* 131* 128* 129*  K 5.1   < > 3.8   < > 4.7   < > 3.8   < > 5.1   < > 4.6 3.9 4.0 3.9 3.9  CL 91*  --  90*  --  85*  --  87*  --  87*  --   --   --  88*  --   --   CO2 33*  --  36*  --  34*  --  34*  --  32  --   --   --  33*  --   --   GLUCOSE 106*  --  54*  --  130*  --  61*  --  200*  --   --   --  88  --   --   BUN 32*  --  30*  --  27*  --  24*  --  27*  --   --   --  23*  --   --   CREATININE 0.94  --  0.86  --  0.86  --  0.76  --  0.90  --   --   --  0.83  --   --   CALCIUM 7.8*  --  7.9*  --  8.0*  --  8.0*  --  8.0*  --   --   --  7.9*  --   --   MG 1.9  --  1.9  --   --   --  2.1  --   --   --   --   --   --   --   --   PHOS 4.3  --  5.1*  --   --   --   --   --   --   --   --   --   --   --   --    < > = values in this interval not displayed.   GFR: Estimated Creatinine Clearance: 105.5 mL/min (by C-G formula based on SCr of 0.83 mg/dL). Recent Labs  Lab 03/22/20 1700 03/23/20 0424 03/23/20 1551 03/24/20 0437  WBC 15.7* 9.5 17.3* 12.0*  LATICACIDVEN 1.6 1.0 1.4 0.7    Liver Function Tests: Recent Labs  Lab 03/20/20 0408 03/21/20 0337 03/22/20 0324 03/23/20 0424 03/24/20 0437  AST 43* 46* 48* 67* 66*  ALT  32 31 33 35 43  ALKPHOS 98 85 81 122 157*  BILITOT 1.2 1.3* 1.7* 1.7* 2.1*  PROT 4.7* 4.9* 5.2* 5.2* 5.2*  ALBUMIN 2.7* 3.2* 3.0* 3.0* 3.0*   No results for input(s): LIPASE, AMYLASE in the last 168 hours. No results for input(s): AMMONIA in the last 168 hours.  ABG    Component Value Date/Time   PHART 7.376 03/24/2020 1133   PCO2ART 61.6 (H) 03/24/2020 1133   PO2ART 81 (L) 03/24/2020 1133   HCO3 36.1 (H) 03/24/2020 1133   TCO2 38 (H) 03/24/2020 1133   ACIDBASEDEF 1.0 03/17/2020 1608   O2SAT 95.0 03/24/2020 1133     Coagulation Profile: Recent Labs  Lab  03/20/20 0408 03/21/20 0337 03/22/20 0324 03/23/20 0424 03/24/20 0437  INR 1.8* 1.9* 1.9* 1.8* 1.9*    Cardiac Enzymes: No results for input(s): CKTOTAL, CKMB, CKMBINDEX, TROPONINI in the last 168 hours.  HbA1C: Hgb A1c MFr Bld  Date/Time Value Ref Range Status  03/02/2020 06:10 PM 11.8 (H) 4.8 - 5.6 % Final    Comment:    (NOTE) Pre diabetes:          5.7%-6.4%  Diabetes:              >6.4%  Glycemic control for   <7.0% adults with diabetes   03/05/2020 08:30 AM 12.2 (H) 4.8 - 5.6 % Final    Comment:    (NOTE) Pre diabetes:          5.7%-6.4%  Diabetes:              >6.4%  Glycemic control for   <7.0% adults with diabetes     CBG: Recent Labs  Lab 03/23/20 1558 03/23/20 1944 03/24/20 0042 03/24/20 0433 03/24/20 0803  GLUCAP 189* 177* 107* 91 140*     Assessment & Plan:   Acute hypoxic and hypercarbic respiratory failure requiring VV ECMO support and mechanical ventilation. ARDS due to COVID-19 viral pneumonia Pneumothorax on left s/p chest tube Bilateral DVT , cardiac clot in transit. Small PE but hemodynamically stable. Concern for RLL pneumonia: s/p abx -Con't ultralung protective ventilation. Ok to cont in line trials on vent with SLP.  -Con't full ECMO support, weaning as tolerated. -Con't lasix infusion, add dose of metolazone today -Con't chest tube to suction until off vent -Routine trach care -Complete courses of remdesivir, Actemra. -steroid taper in -Completed empiric courses of antibiotics for community-acquired pneumonia. - S/p 8 days vanc  -cefepime for 10 days to end today but with fever again will pan cx and perhaps have to change to merrem -Will need 3 months of anticoagulation for provoked thromboembolic disease. -cont seroquel and klonopin to help dyspnea and anxiety to work on coming off infusions. -attempt to stop precedex in case contributing to 99.1 - Con't enteral oxycodone. -con't steroid taper  Agitation-improved now  back on mechanical ventilation. 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 Will d/c this as we shoujld not be treating BS while on d10 but rather weaning d10 -starting relistor to facilitate bowel movement with long term opioid use if able to begin stooling will repeat kub in am and perhaps resume trickle tf and slow advance -goal BG 140-180 -steroid wean   Acute anemia; suspect hemolytic. LDH high -will likely need  circuit change in next few days if unable to wean off soon Thrombocytopenia; remains stable -Transfuse for hemoglobin less than 8 or hemodynamically significant bleeding. 1 unit pRBCs overnight. -Continue to monitor platelets   Leukocytosis-Concern for right lower lobe pneumonia -blood cx with variant strains of staph, completed 8 days vanc -cefepime for 10 days 1/12 -sputum negative -wbc decreasing LGT:  -99.1 but obviously abnormal on ecmo -pan cx with consideration of broadening abx for anaerobic coverage -d/w precedex  Complete heart block; associated with hypoxia due to cannula positioning issues -Resolved -resumed metoprolol yesterday without issue  FEN -holding tube feeds -but hopefully will be able to resume soon ileus -cont reglan and bowel regimen -otherwise bowel rest.  -hold on og placement for ilws -relistor trial -kub in am  Deconditioning -cont PT, OT, SLP -motivated  Daily Goals Checklist  Pain/Anxiety/Delirium protocol (if indicated): enteral oxycodone and clonazepam, precedex, seroquel, clonidine VAP protocol (if indicated):trach DVT prophylaxis: Systemic bivalirudin Nutritional status and feeding goals: High nutritional risk, tube feeds via core track GI prophylaxis: Pantoprazole Urinary catheter: Assessment of intravascular volume Central lines:  Right IJ crescent, left subclavian triple-lumen, right radial arterial line Glucose control:  Monitoring until resumption of tf Mobility/therapy needs: progressive mobility Code Status: Full Family Communication: will update son this afternoon Last GoC meeting 03/18/19 with brother, son, friend> con't aggressive care  Multidisciplinary ECMO rounds with cardiology, pharmacy, RNs, ECMO specialists, and PCCM.  Critical care time: The patient is critically ill with multiple organ systems failure and requires high complexity decision making for assessment and support, frequent evaluation and titration of therapies, application of advanced monitoring technologies and extensive interpretation of multiple databases.  Critical care time 39 mins. This represents my time independent of the NPs time taking care of the pt. This is excluding procedures.    Briant Sites DO [facility] 03/24/2020, 12:12 PM

## 2020-03-24 NOTE — Progress Notes (Signed)
Extracorporeal support note  ECLS cannulation date: 12/30 ECLS support day: 13 Last circuit change: n/a  Indication: Severe respiratory failure secondary to COVID-19 pneumonia with RV dysfunction  Configuration: Venovenous  Drainage cannula: 32 French crescent cannula via right IJ Return cannula: Same  Pump speed: 3350 RPM Pump flow: 4.2 L/min Pump used: Cardio help  Oxygenator: Cardio help O2 blender: 100% Sweep gas: 2L  Circuit check: clot at corners, especially the left- stable Anticoagulant: Bivalirudin Anticoagulation targets: PTT 60-80  Changes in support: cont with distended abdomen. give dose of relistor. Will work again today with standing. Increase diuresis today as well, monitoring sodium which is falling. Pt remains anxious, will have ST come back for in line valve time.   Fever also to just over 99, concern in ecmo pt will decrease heater temp, also pan cx. Pt has just stopped vanc and has been on cefepime for many days. Will also attempt to stop precedex as can contribute to fever and pt has been on for sometime at high dose   Anticipated goals/duration of support: Bridge to recovery.  Multidisciplinary ECMO rounds completed.   Briant Sites, DO 03/24/20 12:08 PM [facility]

## 2020-03-24 NOTE — Progress Notes (Signed)
Patient ID: Alex [name], male   DOB: [birth year], 50 y.o.   MRN: [id number]    Advanced Heart Failure Rounding Note  PCP-Cardiologist: No primary care provider on file.   Subjective:    - 12/30: VV ECMO cannulation - 12/31: Left chest tube replaced - 1/2: Extubated. Echo with EF 60-65%, mildly dilated RV with mildly decreased systolic function.  - 1/4: Agitated, suspected aspiration.  Re-intubated.  - 1/5: ECMO cannula repositioned under TEE guidance. TEE showed moderately dilated/moderate-severely dysfunctional RV in setting of hypoxemia. LUE DVT found.  Patient got 1/2 dose of TPA due to initial concern for large PE.  - 1/6: Tracheostomy - 1/7: Echo with mild RV dilation/mild RV dysfunction.   Remains on Lasix gtt 4 mg/hr. Good diuresis but I/Os and even. Weight up 2 pounds.   CXR mildly improved. Personally reviewed  Remains tachypneic. 3rd spacing.   BP very labile with some period of severe HTN. Metoprolol added yesterday.   LDH climbing   ECMO parameters: 3350 rpm Flow 4.3 L/min Pvenous -57 Delta P 24 Sweep 2 ABG 7.40/62/94/97% LDH 410 => 543 => 728 => 1041 => 998 => 1305 => 934 => 959 => 1009 => 893 => 901 => 1078 => 1,170 Lactate 0.7  On vent 50% TV 300-400  Objective:   Weight Range: 80.7 kg Body mass index is 30.54 kg/m.   Vital Signs:   Temp:  [98.3 F (36.8 C)-99 F (37.2 C)] 99 F (37.2 C) (01/12 0400) Pulse Rate:  [96-132] 105 (01/12 0600) Resp:  [0-51] 31 (01/12 0600) BP: (113-209)/(63-104) 123/70 (01/12 0600) SpO2:  [87 %-100 %] 97 % (01/12 0600) Arterial Line BP: (125-238)/(55-107) 125/58 (01/12 0600) FiO2 (%):  [50 %] 50 % (01/12 0418) Weight:  [80.7 kg] 80.7 kg (01/12 0418) Last BM Date: 03/20/20  Weight change: Filed Weights   03/22/20 0700 03/23/20 0500 03/24/20 0418  Weight: 75.8 kg 79.8 kg 80.7 kg    Intake/Output:   Intake/Output Summary (Last 24 hours) at 03/24/2020 0800 Last data filed at 03/24/2020 0600 Gross per 24 hour   Intake 3724.81 ml  Output 3340 ml  Net 384.81 ml      Physical Exam    General:  Awake tachypneic following commands HEENT: normal Neck: supple.RIJ ecmo cannula + trach Carotids 2+ bilat; no bruits. No lymphadenopathy or thryomegaly appreciated. Cor: PMI nondisplaced. Regular tachy No rubs, gallops or murmurs. Lungs: coarse tachypneic Abdomen: soft, nontender, + distended. No hepatosplenomegaly. No bruits or masses. Good bowel sounds. Extremities: no cyanosis, clubbing, rash, 2+ edema several blue fingers Neuro: alert & following commands   Telemetry   NSR 100-110 Personally reviewed  Labs    CBC Recent Labs    03/23/20 1551 03/23/20 1600 03/24/20 0435 03/24/20 0437  WBC 17.3*  --   --  12.0*  HGB 9.7*   < > 9.2* 8.9*  HCT 30.2*   < > 27.0* 26.2*  MCV 93.2  --   --  91.9  PLT 148*  --   --  127*   < > = values in this interval not displayed.   Basic Metabolic Panel Recent Labs    03/21/20 1624 03/21/20 1634 03/22/20 0324 03/22/20 0604 03/23/20 0424 03/23/20 0801 03/23/20 1551 03/23/20 1600 03/24/20 0435 03/24/20 0437  NA 135   < > 136   < > 132*   < > 129*   < > 130* 131*  K 5.1   < > 3.8   < > 3.8   < >  5.1   < > 3.9 4.0  CL 91*  --  90*   < > 87*  --  87*  --   --  88*  CO2 33*  --  36*   < > 34*  --  32  --   --  33*  GLUCOSE 106*  --  54*   < > 61*  --  200*  --   --  88  BUN 32*  --  30*   < > 24*  --  27*  --   --  23*  CREATININE 0.94  --  0.86   < > 0.76  --  0.90  --   --  0.83  CALCIUM 7.8*  --  7.9*   < > 8.0*  --  8.0*  --   --  7.9*  MG 1.9  --  1.9  --  2.1  --   --   --   --   --   PHOS 4.3  --  5.1*  --   --   --   --   --   --   --    < > = values in this interval not displayed.   Liver Function Tests Recent Labs    03/23/20 0424 03/24/20 0437  AST 67* 66*  ALT 35 43  ALKPHOS 122 157*  BILITOT 1.7* 2.1*  PROT 5.2* 5.2*  ALBUMIN 3.0* 3.0*   No results for input(s): LIPASE, AMYLASE in the last 72 hours. Cardiac Enzymes No  results for input(s): CKTOTAL, CKMB, CKMBINDEX, TROPONINI in the last 72 hours.  BNP: BNP (last 3 results) No results for input(s): BNP in the last 8760 hours.  ProBNP (last 3 results) No results for input(s): PROBNP in the last 8760 hours.   D-Dimer No results for input(s): DDIMER in the last 72 hours. Hemoglobin A1C No results for input(s): HGBA1C in the last 72 hours. Fasting Lipid Panel No results for input(s): CHOL, HDL, LDLCALC, TRIG, CHOLHDL, LDLDIRECT in the last 72 hours. Thyroid Function Tests No results for input(s): TSH, T4TOTAL, T3FREE, THYROIDAB in the last 72 hours.  Invalid input(s): FREET3  Other results:   Imaging    DG CHEST PORT 1 VIEW  Result Date: 03/24/2020 CLINICAL DATA:  50 year old male COVID-70.  ARDS.  ECMO. EXAM: PORTABLE CHEST 1 VIEW COMPARISON:  Portable chest 03/23/2020 and earlier. FINDINGS: Portable AP semi upright view at 0532 hours. Stable tracheostomy, left chest tube, visible enteric feeding tube, left chest central line, right chest ECMO cannula. Mildly improved bilateral upper lung ventilation since yesterday, with slightly improved visualization of mediastinal contours. Bilateral perihilar air bronchograms and extensive bilateral pulmonary opacity persists. No pneumothorax. No definite pleural effusion. Stable visualized osseous structures. IMPRESSION: 1.  Stable lines and tubes. 2. Continued diffuse pulmonary opacity/ARDS but mildly improved bilateral upper lung ventilation since yesterday. Electronically Signed   By: Genevie Ann M.D.   On: 03/24/2020 06:53     Medications:     Scheduled Medications: . sodium chloride   Intravenous Once  . chlorhexidine gluconate (MEDLINE KIT)  15 mL Mouth Rinse BID  . Chlorhexidine Gluconate Cloth  6 each Topical Daily  . clonazePAM  1.5 mg Per Tube Q6H  . cloNIDine  0.2 mg Per Tube Q8H  . docusate  100 mg Per Tube BID  . free water  60 mL Per Tube Q8H  . guaiFENesin-dextromethorphan  10 mL Per Tube  BID  . insulin aspart  3-9  Units Subcutaneous Q4H  . living well with diabetes book   Does not apply Once  . mouth rinse  15 mL Mouth Rinse 10 times per day  . metoCLOPramide (REGLAN) injection  5 mg Intravenous Q8H  . metoprolol tartrate  25 mg Per Tube BID  . midazolam  5 mg Intravenous Once  . neostigmine  0.25 mg Subcutaneous Q6H  . oxyCODONE  20 mg Per Tube Q6H  . pantoprazole sodium  40 mg Per Tube QHS  . polyethylene glycol  17 g Per Tube BID  . predniSONE  30 mg Per Tube Q breakfast   Followed by  . [START ON 03/26/2020] predniSONE  20 mg Per Tube Q breakfast   Followed by  . [START ON 03/29/2020] predniSONE  10 mg Per Tube Q breakfast  . QUEtiapine  150 mg Per Tube TID  . sennosides  10 mL Per Tube BID  . sodium chloride flush  10-40 mL Intracatheter Q12H    Infusions: . sodium chloride    . sodium chloride Stopped (03/23/20 0951)  . sodium chloride 1,000 mL (03/24/20 0034)  . sodium chloride Stopped (03/12/20 0131)  . albumin human Stopped (03/22/20 1900)  . bivalirudin (ANGIOMAX) infusion 0.5 mg/mL (Non-ACS indications) 0.25 mg/kg/hr (03/24/20 3762)  . ceFEPime (MAXIPIME) IV 2 g (03/24/20 0114)  . dexmedetomidine (PRECEDEX) IV infusion 1.4 mcg/kg/hr (03/24/20 0417)  . dextrose 50 mL/hr at 03/24/20 0413  . feeding supplement (PIVOT 1.5 CAL) Stopped (03/21/20 0800)  . fentaNYL infusion INTRAVENOUS 200 mcg/hr (03/24/20 0256)  . furosemide (LASIX) 200 mg in dextrose 5% 100 mL (34m/mL) infusion 4 mg/hr (03/24/20 0500)    PRN Medications: Place/Maintain arterial line **AND** sodium chloride, sodium chloride, sodium chloride, acetaminophen (TYLENOL) oral liquid 160 mg/5 mL, albumin human, chlorpheniramine-HYDROcodone, dextrose, fentaNYL (SUBLIMAZE) injection, guaiFENesin, haloperidol lactate, hydrALAZINE, labetalol, lip balm, LORazepam, ondansetron (ZOFRAN) IV, oxyCODONE, sodium chloride flush   Assessment/Plan   1. Acute hypoxemic respiratory failure: Due to COVID-19 PNA  with bilateral infiltrates.  Refractory hypoxemia, VV-ECMO cannulation on 03/10/2020 with improvement in oxygenation.  Developed left PTX post-subclavian CVL and has left chest tube, the left lung is re-expanded.  He was extubated 1/2 but reintubated 1/4 with agitation and suspected aspiration.  Tracheostomy 1/6.  CXR with bilateral infiltrates, improved today.  He is now on cefepime for empiric abx coverage.  ECMO cannula repositioned 1/5.  LDH continues to climb but circuit functioning normally.   - ECMO circuit functioning appropriately despite climbing LDH. Several clots in oxygenator - Continue bivalirudin, goal PTT 60-80. He is at 824today Discussed dosing with PharmD personally. With HTN try to keep at lower end of range - Patient has had remdesivir, tocilizumab. - Ongoing steroids with prednisone.  - Continue cefepime 1 (stop date today) -> has fever today. Will pan culture and adjust as needed - Sweep is low and gas ok despite tachypnea. LDH on circuit rising. Will diurese more today and also try to have him have a BM to relieve distension. Will then try a sweep trial this afternoon. If we do no think we can liberate from ECMO in next day or two will likely need circuit change prior to the weekend  - Conitnue Lasix to 4 mg/hr. Give one does metolazone today. Place TED hose 2. RLE DVT/LUE DVT/thrombus in RV/suspect PE: Echo with moderately dilated and moderately dysfunctional RV.  Clot noted on TEE in RV as well.  TTE 1/2 showed normal EF 60-65%, RV improved (mildly dilated/dysfunctional). TEE on 1/5  with moderate to severe RV dysfunction but patient was hypoxemic.  Had 1/2 dose TPA on 1/5. Echo 1/7 with mildly dilated/mildly dysfunctional RV.  - Bivalirudin for goal PTT 60-80.  Management as above 3. Left PTX: Left chest tube, lung is re-expanded.   4. Shock: Suspect septic/distributive.  Now resolved, off NE.  5. Anemia: Hgb 8.9, transfuse < 8.   6. AKI: Resolved.  7. Hyperglycemia: insulin.   8. HTN: Following cuff pressure (whip in ABG). BP is elevated with agitation. Adjusting meds as needed 9. CHB: Episode of CHB when hypoxemic.  NSR since then.  - With elevated HR, will continue  metoprolol 25 mg bid but follow closely.  10. Thrombocytopenia: Mild, stable. 11. Ileus: TFs on hold.  - Getting Reglan and neostigmine.  - Add Relastor and sorbitol 12. Ischemic digits - follow  CRITICAL CARE Performed by: Glori Bickers  Total critical care time: 45  minutes  Critical care time was exclusive of separately billable procedures and treating other patients.  Critical care was necessary to treat or prevent imminent or life-threatening deterioration.  Critical care was time spent personally by me on the following activities: development of treatment plan with patient and/or surrogate as well as nursing, discussions with consultants, evaluation of patient's response to treatment, examination of patient, obtaining history from patient or surrogate, ordering and performing treatments and interventions, ordering and review of laboratory studies, ordering and review of radiographic studies, pulse oximetry and re-evaluation of patient's condition.    Length of Stay: [place], MD  03/24/2020, 8:00 AM  Advanced Heart Failure Team Pager [phone] (M-F; 7a - 4p)  Please contact [facility] for night-coverage after hours (4p -7a ) and weekends on [email or web address]

## 2020-03-24 NOTE — Progress Notes (Signed)
Attempted to reach son x2 today without answer and inability to leave message  Attempted to reach brother but went straight to vm.   Unable to provide update today will attempt again tomorrow.

## 2020-03-24 NOTE — Progress Notes (Signed)
OT Cancellation Note  Patient Details Name: Alex [name] MRN: [id number] DOB: 1970-09-21   Cancelled Treatment:    Reason Eval/Treat Not Completed: Patient declined, seen by PT and scheduled for ST at 1:30.  OT will continue efforts as appropriate.    Lakitha Gordy D Cina Klumpp 03/24/2020, 12:58 PM

## 2020-03-24 NOTE — Progress Notes (Addendum)
ANTICOAGULATION CONSULT NOTE - Follow Up Consult  Pharmacy Consult for bivalirudin Indication: ECMO and VTE  Labs: Recent Labs    03/22/20 0324 03/22/20 0604 03/23/20 0424 03/23/20 0801 03/23/20 1551 03/23/20 1600 03/24/20 0437 03/24/20 0844 03/24/20 1722 03/24/20 1724 03/24/20 1743 03/24/20 1752  HGB 9.3*   < > 9.2*   < > 9.7*   < > 8.9*   < >  --  9.2* 9.2* 9.2*  HCT 27.7*   < > 28.3*   < > 30.2*   < > 26.2*   < >  --  27.0* 27.0* 27.0*  PLT 121*   < > 126*  --  148*  --  127*  --   --   --   --   --   APTT 81*   < > 68*  --  71*  --  82*  --  90*  --   --   --   LABPROT 20.8*  --  20.0*  --   --   --  21.4*  --   --   --   --   --   INR 1.9*  --  1.8*  --   --   --  1.9*  --   --   --   --   --   CREATININE 0.86   < > 0.76  --  0.90  --  0.83  --   --   --   --   --    < > = values in this interval not displayed.    Assessment: 54 yoM admitted with COVID-19 PNA with worsening hypoxia, s/p cannulation for ECMO. Pt was started on IV heparin prior to cannulation due to acute DVTs and possible PE,  transitioned to bivalirudin with ECMO. Now s/p tPA on 1/5 and tracheostomy on 1/6.   APTT came back at 90, supratherapeutic, on bivalirudin@0 .25 mg/kg/hr. Has some flank bruising by CT - unchanged and no increase in CT drainage, Hgb 9.2. Circuit stable with clots.   Goal of Therapy:  aPTT 60-80 seconds   Plan:  Will decrease bivalirudin gtt to 0.22 mg/kg/hr and check PTT in 4 hours Monitor CBC, aPTT, LDH, and for s/sx of bleeding   Sherron Monday, PharmD, BCCCP Clinical Pharmacist  Phone: [phone] 03/24/2020 6:12 PM  Please check AMION for all [facility] phone numbers After 10:00 PM, call Main Pharmacy [phone]

## 2020-03-24 NOTE — Progress Notes (Signed)
SLP Cancellation Note  Patient Details Name: Alex [name] MRN: [id number] DOB: 27-Apr-1970   Cancelled treatment:       Reason Eval/Treat Not Completed: Fatigue/lethargy limiting ability to participate. Pt oo fatigued after PT to attempt inline. Plan to try again at 130.     Teriyah Purington, Riley Nearing 03/24/2020, 11:25 AM

## 2020-03-24 NOTE — Progress Notes (Incomplete)
Nutrition Follow-up  DOCUMENTATION CODES:   Not applicable  INTERVENTION:  *** Tube Feeding via Cortrak:    NUTRITION DIAGNOSIS:   Increased nutrient needs related to acute illness,catabolic illness (COVID-19 infection) as evidenced by estimated needs.  Being addressed via TF   GOAL:   Patient will meet greater than or equal to 90% of their needs  Progressing  MONITOR:   Vent status,TF tolerance,Labs,Weight trends  REASON FOR ASSESSMENT:   LOS Enteral/tube feeding initiation and management  ASSESSMENT:   50 y.o. male with no significant medical history. He presented to the ED with dyspnea, cough, and N/V. He reported that his symptoms began with cough 1 week ago. He tried OTC meds but nothing helped. Symptoms worsened to include body aches, fatigue, and N/V 3 days later. He went to his PCP 12/26 and got tested for COVID; he was positive.   12/26 COVID+  12/28 Admitted to [facility] 12/29 Intubated 12/30 Transferred to [facility], VV ECMO cannulation, L PTX with Chest tube insertion 12/31 Cortrak placed, Post-pyloric  1/02 Extubated to HFNC/BiPap as needed, ECHO with EF 60-65% 1/06 TEE for ECMO cannula position, Re-Intubated, Rhinorockets placed for epistaxis, Cortrak malpositioned-repositioned and now gastric per xray 1/07 Trach placed  Remains on VV ECMO Worked with PT x 3 today; plan for SLP to do PMV trials as able today  TF remains on hold. +BM via rectal tube,  Currently on D10 at 50 ml/hr due to hypoglycemia  Labs: sodium 131 (L) Meds: lasix drip,    Diet Order:   Diet Order            Diet NPO time specified  Diet effective midnight                 EDUCATION NEEDS:   Not appropriate for education at this time  Skin:  Skin Assessment: Reviewed RN Assessment  Last BM:  1/6 rectal tube  Height:   Ht Readings from Last 1 Encounters:  03/12/2020 5\' 4"  (1.626 m)    Weight:   Wt Readings from Last 1 Encounters:  03/24/20 80.7 kg    Ideal Body  Weight:  59.1 kg  BMI:  Body mass index is 30.54 kg/m.  Estimated Nutritional Needs:   Kcal:  2160-2520 kcals  Protein:  130-150 g  Fluid:  >/= 2 L   05-30-1992 MS, RDN, LDN, CNSC Registered Dietitian III Clinical Nutrition RD Pager and On-Call Pager Number Located in [place]

## 2020-03-24 NOTE — Progress Notes (Signed)
Physical Therapy Treatment Patient Details Name: Alex [name] MRN: [id number] DOB: 08-26-1970 Today's Date: 03/24/2020    History of Present Illness 50 y.o. male  with no significant past medical history admitted on 03-10-20 with dyspnea, cough, nausea/vomiting ~ 1 week ago with worsening symptoms of body aches and fatigue and +COVID 02/07/20 and admitted with shortness of breath. Required intubation 03/10/20. Cannulated for VV ECMO 02/26/2020. Oxygenating better with ECMO. Also evidence of RLE DVT, RV thrombus, and high suspicion of PE. Has required chest tube to L lung for collapse which needs further reposition with recurrent collapse. Extubated 03-15-19.  Reintubated 1/5 and trach placed 1/6.    PT Comments    Pt admitted with above diagnosis. Pt was able to sit EOB for 5 min and sat or stood in [place] about 5 min.  Pt needs mod to max assist of 2 for mobility with 3rd person to assist with lines.  Pt requires rests with short bursts of activity and fatigues quickly. Will continue to follow acutely and progress as able.  Limited by decr endurance for activity. Pt currently with functional limitations due to balance and endurance deficits. Pt will benefit from skilled PT to increase their independence and safety with mobility to allow discharge to the venue listed below.     Follow Up Recommendations  CIR;Supervision/Assistance - 24 hour     Equipment Recommendations  Other (comment) (TBA)    Recommendations for Other Services       Precautions / Restrictions Precautions Precautions: Fall Precaution Comments: ECMO, Vent, chest tube, rectal tube, Foley Restrictions Weight Bearing Restrictions: No    Mobility  Bed Mobility Overal bed mobility: Needs Assistance Bed Mobility: Rolling;Sidelying to Sit;Sit to Sidelying Rolling: Mod assist;+2 for physical assistance (extra 2 persons for ECMO and other lines) Sidelying to sit: Mod assist;Max assist;+2 for physical assistance (extra 2  persons)     Sit to sidelying: Total assist;+2 for physical assistance (extra 2 persons for lines) General bed mobility comments: modAx3 for helicopter transfer to EoB and total A for returning to bed, heavy use of bed pad to scoot back in bed due to fact Stedy did not fit under Kreg bed  Transfers Overall transfer level: Needs assistance   Transfers: Sit to/from Stand Sit to Stand: Total assist;+2 physical assistance;Max assist;From elevated surface         General transfer comment: pt very eager to get out of bed and nods understanding to use of Stedy. pt is maxAx2 for initial sit>stand to [facility], vc for upright posture for power up, good ability to come to fully upright for pad placement, pt performed multiple stands in [place] and then began to fatigue.  Stood 10 seconds x 2, 20 seconds, 30 seconds and then 45 seconds.  Pt requires total A once he fatigues.  +3 assist to return pt to bed safely.  Ambulation/Gait             General Gait Details: TBA   Stairs             Wheelchair Mobility    Modified Rankin (Stroke Patients Only)       Balance Overall balance assessment: Needs assistance Sitting-balance support: Feet supported;Bilateral upper extremity supported Sitting balance-Leahy Scale: Poor Sitting balance - Comments: pt able to sit in [place] off and on for 5 minutes with min-max A for steadying, increased RR however maintains SaO2 on 90%FiO2 on vent with trach.  Needed mod to max to sit EOB as pt pushes posteriorly  at times.   Standing balance support: Bilateral upper extremity supported;During functional activity Standing balance-Leahy Scale: Poor Standing balance comment: relies on UE support for balance as well as 2 person mod to max assist                            Cognition Arousal/Alertness: Awake/alert Behavior During Therapy: Flat affect Overall Cognitive Status: Within Functional Limits for tasks assessed                                  General Comments: pt very frustrated by not being able to communicate with staff      Exercises General Exercises - Lower Extremity Long Arc Quad: AROM;Both;5 reps;Seated    General Comments General comments (skin integrity, edema, etc.): VSS on 50% FiO2 initially and nurse placed pt on 90% FiO2 during treatment.  PEEP 10      Pertinent Vitals/Pain Pain Assessment: Faces Faces Pain Scale: Hurts little more Pain Location: generalized Pain Descriptors / Indicators: Grimacing;Guarding;Discomfort Pain Intervention(s): Limited activity within patient's tolerance;Monitored during session;Repositioned    Home Living                      Prior Function            PT Goals (current goals can now be found in the care plan section) Acute Rehab PT Goals Patient Stated Goal: unable to state Progress towards PT goals: Progressing toward goals    Frequency    Min 3X/week      PT Plan Current plan remains appropriate    Co-evaluation              AM-PAC PT "6 Clicks" Mobility   Outcome Measure  Help needed turning from your back to your side while in a flat bed without using bedrails?: A Lot Help needed moving from lying on your back to sitting on the side of a flat bed without using bedrails?: A Lot Help needed moving to and from a bed to a chair (including a wheelchair)?: Total Help needed standing up from a chair using your arms (e.g., wheelchair or bedside chair)?: A Lot Help needed to walk in hospital room?: Total Help needed climbing 3-5 steps with a railing? : Total 6 Click Score: 9    End of Session Equipment Utilized During Treatment: Other (comment) (vent with trach) Activity Tolerance: Patient limited by fatigue Patient left: in bed;with call bell/phone within reach;with bed alarm set;with restraints reapplied;with nursing/sitter in room Nurse Communication: Mobility status PT Visit Diagnosis: Muscle weakness (generalized)  (M62.81);Pain Pain - part of body:  (chest)     Time: 2130-8657 PT Time Calculation (min) (ACUTE ONLY): 35 min  Charges:  $Therapeutic Exercise: 8-22 mins $Therapeutic Activity: 8-22 mins                     Tawnya Pujol W,PT Acute Rehabilitation Services Pager:  [phone]  Office:  [phone]     Berline Lopes 03/24/2020, 2:20 PM

## 2020-03-24 NOTE — Progress Notes (Signed)
Sweep trial started 1743  Pt's heart rate slowed, sats dropped and in distress.  Sweep turned on to 2 at 1748   1724 Sweep at 3 1743 Sweep at 1 [address] off  Results for Alex [name], Alex [name] (MRN [id number]) as of 03/24/2020 18:15  Ref. Range 03/24/2020 17:22 03/24/2020 17:24 03/24/2020 17:43 03/24/2020 17:52  Sample type Unknown  ARTERIAL ARTERIAL ARTERIAL  pH, Arterial Latest Ref Range: 7.350 - 7.450   7.430 7.415 7.404  pCO2 arterial Latest Ref Range: 32.0 - 48.0 mmHg  59.4 (H) 62.3 (H) 64.7 (H)  pO2, Arterial Latest Ref Range: 83.0 - 108.0 mmHg  74 (L) 81 (L) 33 (LL)  TCO2 Latest Ref Range: 22 - 32 mmol/L  41 (H) 42 (H) 42 (H)  Acid-Base Excess Latest Ref Range: 0.0 - 2.0 mmol/L  13.0 (H) 13.0 (H) 14.0 (H)  Bicarbonate Latest Ref Range: 20.0 - 28.0 mmol/L  39.6 (H) 39.9 (H) 40.4 (H)  O2 Saturation Latest Units: %  95.0 95.0 60.0  Patient temperature Unknown  97.8 F 37.1 C 37.1 C  BASIC METABOLIC PANEL Unknown Rpt     Sodium Latest Ref Range: 135 - 145 mmol/L  128 (L) 127 (L) 127 (L)  Potassium Latest Ref Range: 3.5 - 5.1 mmol/L  3.7 3.6 4.3  Calcium Ionized Latest Ref Range: 1.15 - 1.40 mmol/L  1.09 (L) 1.10 (L) 1.13 (L)  WBC Latest Ref Range: 4.0 - 10.5 K/uL 13.4 (H)     RBC Latest Ref Range: 4.22 - 5.81 MIL/uL 2.88 (L)     Hemoglobin Latest Ref Range: 13.0 - 17.0 g/dL 9.0 (L) 9.2 (L) 9.2 (L) 9.2 (L)  HCT Latest Ref Range: 39.0 - 52.0 % 26.3 (L) 27.0 (L) 27.0 (L) 27.0 (L)  MCV Latest Ref Range: 80.0 - 100.0 fL 91.3     MCH Latest Ref Range: 26.0 - 34.0 pg 31.3     MCHC Latest Ref Range: 30.0 - 36.0 g/dL 41.9     RDW Latest Ref Range: 11.5 - 15.5 % 16.6 (H)     Platelets Latest Ref Range: 150 - 400 K/uL 126 (L)     nRBC Latest Ref Range: 0.0 - 0.2 % 0.0     APTT Latest Ref Range: 24 - 36 seconds 90 (H)

## 2020-03-24 NOTE — Progress Notes (Signed)
  Speech Language Pathology Treatment: Dysphagia  Patient Details Name: Alex [name] MRN: [id number] DOB: 22-Mar-1970 Today's Date: 03/24/2020 Time: 4239-5320 SLP Time Calculation (min) (ACUTE ONLY): 42 min  Assessment / Plan / Recommendation Clinical Impression  Pt successful with PMSV this afternoon. RT present to deflate cuff, adjust vent settings (PCV Pressure 10cm H20, dropped PEEP from 10 to 3, increased Trigger sensitivity to 5). Pt immediate achieve cuff leak and redirection of air to upper airway with PMSV in place. Pt needed repeated cues to attempt true phonation and not glottal fricatives for speech. Likely airflow was not adequate for nice loud phonation today, question if position dependent given potential for poor trach placement in the trachea when upright. Will pull him up fully next time for chair position. Pt able to communicate with son on the phone in short audible phrases. Pt sometimes needs extra time to formulate words and phrases but does a good job of over articulating to emphasize speech. Brief assessment with aug com tools attempted, but as suspected vision and physical barriers made pointing to a board impossible. Will continue efforts. Plan to see Alex [name] tomorrow with Fannie Knee RT and his [name] at 1:00 pm  HPI HPI: 50 year old man admitted to hospital 12/28 with 1 week history of dyspnea cough nausea and vomiting.  Initially admitted to [facility] long hospital and placed on high flow nasal cannula but rapidly failed and required intubation 12/29.  Persistent hypoxic respiratory failure with PF ratio 55 in spite of 18 of PEEP FiO2 0.1 despite paralytics.  Did not improve with prone ventilation. Cannulated for VV ECMO 12/30 via right IJ crescent cannula. Iatrogenic pneumothorax from left subclavian triple-lumen placement. Trach on 1/7.      SLP Plan  Continue with current plan of care       Recommendations         Patient may use Passy-Muir Speech Valve: with SLP only          Follow up Recommendations: LTACH SLP Visit Diagnosis: Aphonia (R49.1) Plan: Continue with current plan of care       GO                Kentrel Clevenger, Riley Nearing 03/24/2020, 2:40 PM

## 2020-03-25 ENCOUNTER — Inpatient Hospital Stay (HOSPITAL_COMMUNITY): Payer: Medicaid Other

## 2020-03-25 LAB — POCT I-STAT 7, (LYTES, BLD GAS, ICA,H+H)
Acid-Base Excess: 17 mmol/L — ABNORMAL HIGH (ref 0.0–2.0)
Acid-Base Excess: 18 mmol/L — ABNORMAL HIGH (ref 0.0–2.0)
Acid-Base Excess: 22 mmol/L — ABNORMAL HIGH (ref 0.0–2.0)
Acid-Base Excess: 20 mmol/L — ABNORMAL HIGH (ref 0.0–2.0)
Acid-Base Excess: 18 mmol/L — ABNORMAL HIGH (ref 0.0–2.0)
Acid-Base Excess: 18 mmol/L — ABNORMAL HIGH (ref 0.0–2.0)
Acid-Base Excess: 17 mmol/L — ABNORMAL HIGH (ref 0.0–2.0)
Acid-Base Excess: 15 mmol/L — ABNORMAL HIGH (ref 0.0–2.0)
Acid-Base Excess: 16 mmol/L — ABNORMAL HIGH (ref 0.0–2.0)
Acid-Base Excess: 15 mmol/L — ABNORMAL HIGH (ref 0.0–2.0)
Acid-Base Excess: 14 mmol/L — ABNORMAL HIGH (ref 0.0–2.0)
Bicarbonate: 44.1 mmol/L — ABNORMAL HIGH (ref 20.0–28.0)
Bicarbonate: 44.3 mmol/L — ABNORMAL HIGH (ref 20.0–28.0)
Bicarbonate: 48.6 mmol/L — ABNORMAL HIGH (ref 20.0–28.0)
Bicarbonate: 47.2 mmol/L — ABNORMAL HIGH (ref 20.0–28.0)
Bicarbonate: 44.7 mmol/L — ABNORMAL HIGH (ref 20.0–28.0)
Bicarbonate: 45.1 mmol/L — ABNORMAL HIGH (ref 20.0–28.0)
Bicarbonate: 43.4 mmol/L — ABNORMAL HIGH (ref 20.0–28.0)
Bicarbonate: 40.7 mmol/L — ABNORMAL HIGH (ref 20.0–28.0)
Bicarbonate: 41 mmol/L — ABNORMAL HIGH (ref 20.0–28.0)
Bicarbonate: 40.1 mmol/L — ABNORMAL HIGH (ref 20.0–28.0)
Bicarbonate: 38.1 mmol/L — ABNORMAL HIGH (ref 20.0–28.0)
Calcium, Ion: 1.11 mmol/L — ABNORMAL LOW (ref 1.15–1.40)
Calcium, Ion: 1.11 mmol/L — ABNORMAL LOW (ref 1.15–1.40)
Calcium, Ion: 1.09 mmol/L — ABNORMAL LOW (ref 1.15–1.40)
Calcium, Ion: 1.13 mmol/L — ABNORMAL LOW (ref 1.15–1.40)
Calcium, Ion: 1.12 mmol/L — ABNORMAL LOW (ref 1.15–1.40)
Calcium, Ion: 1.15 mmol/L (ref 1.15–1.40)
Calcium, Ion: 1.16 mmol/L (ref 1.15–1.40)
Calcium, Ion: 1.14 mmol/L — ABNORMAL LOW (ref 1.15–1.40)
Calcium, Ion: 1.13 mmol/L — ABNORMAL LOW (ref 1.15–1.40)
Calcium, Ion: 1.15 mmol/L (ref 1.15–1.40)
Calcium, Ion: 1.11 mmol/L — ABNORMAL LOW (ref 1.15–1.40)
HCT: 27 % — ABNORMAL LOW (ref 39.0–52.0)
HCT: 26 % — ABNORMAL LOW (ref 39.0–52.0)
HCT: 28 % — ABNORMAL LOW (ref 39.0–52.0)
HCT: 27 % — ABNORMAL LOW (ref 39.0–52.0)
HCT: 26 % — ABNORMAL LOW (ref 39.0–52.0)
HCT: 27 % — ABNORMAL LOW (ref 39.0–52.0)
HCT: 27 % — ABNORMAL LOW (ref 39.0–52.0)
HCT: 27 % — ABNORMAL LOW (ref 39.0–52.0)
HCT: 26 % — ABNORMAL LOW (ref 39.0–52.0)
HCT: 26 % — ABNORMAL LOW (ref 39.0–52.0)
HCT: 25 % — ABNORMAL LOW (ref 39.0–52.0)
Hemoglobin: 9.2 g/dL — ABNORMAL LOW (ref 13.0–17.0)
Hemoglobin: 8.8 g/dL — ABNORMAL LOW (ref 13.0–17.0)
Hemoglobin: 9.5 g/dL — ABNORMAL LOW (ref 13.0–17.0)
Hemoglobin: 9.2 g/dL — ABNORMAL LOW (ref 13.0–17.0)
Hemoglobin: 8.8 g/dL — ABNORMAL LOW (ref 13.0–17.0)
Hemoglobin: 9.2 g/dL — ABNORMAL LOW (ref 13.0–17.0)
Hemoglobin: 9.2 g/dL — ABNORMAL LOW (ref 13.0–17.0)
Hemoglobin: 9.2 g/dL — ABNORMAL LOW (ref 13.0–17.0)
Hemoglobin: 8.8 g/dL — ABNORMAL LOW (ref 13.0–17.0)
Hemoglobin: 8.8 g/dL — ABNORMAL LOW (ref 13.0–17.0)
Hemoglobin: 8.5 g/dL — ABNORMAL LOW (ref 13.0–17.0)
O2 Saturation: 94 %
O2 Saturation: 94 %
O2 Saturation: 95 %
O2 Saturation: 94 %
O2 Saturation: 94 %
O2 Saturation: 94 %
O2 Saturation: 90 %
O2 Saturation: 91 %
O2 Saturation: 92 %
O2 Saturation: 91 %
O2 Saturation: 92 %
Patient temperature: 98.2
Patient temperature: 37.5
Patient temperature: 37.3
Patient temperature: 37
Patient temperature: 36.8
Patient temperature: 37
Patient temperature: 37
Patient temperature: 37.1
Patient temperature: 37.2
Patient temperature: 37.2
Patient temperature: 98.6
Potassium: 3.2 mmol/L — ABNORMAL LOW (ref 3.5–5.1)
Potassium: 3.5 mmol/L (ref 3.5–5.1)
Potassium: 3.4 mmol/L — ABNORMAL LOW (ref 3.5–5.1)
Potassium: 3.7 mmol/L (ref 3.5–5.1)
Potassium: 3.9 mmol/L (ref 3.5–5.1)
Potassium: 3.9 mmol/L (ref 3.5–5.1)
Potassium: 4.4 mmol/L (ref 3.5–5.1)
Potassium: 3.9 mmol/L (ref 3.5–5.1)
Potassium: 3.8 mmol/L (ref 3.5–5.1)
Potassium: 3.8 mmol/L (ref 3.5–5.1)
Potassium: 3.7 mmol/L (ref 3.5–5.1)
Sodium: 127 mmol/L — ABNORMAL LOW (ref 135–145)
Sodium: 126 mmol/L — ABNORMAL LOW (ref 135–145)
Sodium: 127 mmol/L — ABNORMAL LOW (ref 135–145)
Sodium: 128 mmol/L — ABNORMAL LOW (ref 135–145)
Sodium: 128 mmol/L — ABNORMAL LOW (ref 135–145)
Sodium: 128 mmol/L — ABNORMAL LOW (ref 135–145)
Sodium: 129 mmol/L — ABNORMAL LOW (ref 135–145)
Sodium: 128 mmol/L — ABNORMAL LOW (ref 135–145)
Sodium: 128 mmol/L — ABNORMAL LOW (ref 135–145)
Sodium: 130 mmol/L — ABNORMAL LOW (ref 135–145)
Sodium: 130 mmol/L — ABNORMAL LOW (ref 135–145)
TCO2: 46 mmol/L — ABNORMAL HIGH (ref 22–32)
TCO2: 46 mmol/L — ABNORMAL HIGH (ref 22–32)
TCO2: >50 mmol/L — ABNORMAL HIGH (ref 22–32)
TCO2: 49 mmol/L — ABNORMAL HIGH (ref 22–32)
TCO2: 47 mmol/L — ABNORMAL HIGH (ref 22–32)
TCO2: 47 mmol/L — ABNORMAL HIGH (ref 22–32)
TCO2: 45 mmol/L — ABNORMAL HIGH (ref 22–32)
TCO2: 42 mmol/L — ABNORMAL HIGH (ref 22–32)
TCO2: 43 mmol/L — ABNORMAL HIGH (ref 22–32)
TCO2: 42 mmol/L — ABNORMAL HIGH (ref 22–32)
TCO2: 40 mmol/L — ABNORMAL HIGH (ref 22–32)
pCO2 arterial: 69.1 mmHg (ref 32.0–48.0)
pCO2 arterial: 67.5 mmHg (ref 32.0–48.0)
pCO2 arterial: 66.1 mmHg (ref 32.0–48.0)
pCO2 arterial: 68.9 mmHg (ref 32.0–48.0)
pCO2 arterial: 64 mmHg — ABNORMAL HIGH (ref 32.0–48.0)
pCO2 arterial: 72.2 mmHg (ref 32.0–48.0)
pCO2 arterial: 64.3 mmHg — ABNORMAL HIGH (ref 32.0–48.0)
pCO2 arterial: 57 mmHg — ABNORMAL HIGH (ref 32.0–48.0)
pCO2 arterial: 53.7 mmHg — ABNORMAL HIGH (ref 32.0–48.0)
pCO2 arterial: 50.3 mmHg — ABNORMAL HIGH (ref 32.0–48.0)
pCO2 arterial: 47.1 mmHg (ref 32.0–48.0)
pH, Arterial: 7.412 (ref 7.350–7.450)
pH, Arterial: 7.427 (ref 7.350–7.450)
pH, Arterial: 7.475 — ABNORMAL HIGH (ref 7.350–7.450)
pH, Arterial: 7.443 (ref 7.350–7.450)
pH, Arterial: 7.403 (ref 7.350–7.450)
pH, Arterial: 7.452 — ABNORMAL HIGH (ref 7.350–7.450)
pH, Arterial: 7.438 (ref 7.350–7.450)
pH, Arterial: 7.462 — ABNORMAL HIGH (ref 7.350–7.450)
pH, Arterial: 7.491 — ABNORMAL HIGH (ref 7.350–7.450)
pH, Arterial: 7.51 — ABNORMAL HIGH (ref 7.350–7.450)
pH, Arterial: 7.515 — ABNORMAL HIGH (ref 7.350–7.450)
pO2, Arterial: 73 mmHg — ABNORMAL LOW (ref 83.0–108.0)
pO2, Arterial: 73 mmHg — ABNORMAL LOW (ref 83.0–108.0)
pO2, Arterial: 75 mmHg — ABNORMAL LOW (ref 83.0–108.0)
pO2, Arterial: 70 mmHg — ABNORMAL LOW (ref 83.0–108.0)
pO2, Arterial: 71 mmHg — ABNORMAL LOW (ref 83.0–108.0)
pO2, Arterial: 75 mmHg — ABNORMAL LOW (ref 83.0–108.0)
pO2, Arterial: 60 mmHg — ABNORMAL LOW (ref 83.0–108.0)
pO2, Arterial: 60 mmHg — ABNORMAL LOW (ref 83.0–108.0)
pO2, Arterial: 60 mmHg — ABNORMAL LOW (ref 83.0–108.0)
pO2, Arterial: 56 mmHg — ABNORMAL LOW (ref 83.0–108.0)
pO2, Arterial: 58 mmHg — ABNORMAL LOW (ref 83.0–108.0)

## 2020-03-25 LAB — BASIC METABOLIC PANEL
Anion gap: 13 (ref 5–15)
Anion gap: 11 (ref 5–15)
Anion gap: 11 (ref 5–15)
BUN: 19 mg/dL (ref 6–20)
BUN: 18 mg/dL (ref 6–20)
BUN: 19 mg/dL (ref 6–20)
CO2: 37 mmol/L — ABNORMAL HIGH (ref 22–32)
CO2: 42 mmol/L — ABNORMAL HIGH (ref 22–32)
CO2: 39 mmol/L — ABNORMAL HIGH (ref 22–32)
Calcium: 8.5 mg/dL — ABNORMAL LOW (ref 8.9–10.3)
Calcium: 8.5 mg/dL — ABNORMAL LOW (ref 8.9–10.3)
Calcium: 8.6 mg/dL — ABNORMAL LOW (ref 8.9–10.3)
Chloride: 78 mmol/L — ABNORMAL LOW (ref 98–111)
Chloride: 77 mmol/L — ABNORMAL LOW (ref 98–111)
Chloride: 81 mmol/L — ABNORMAL LOW (ref 98–111)
Creatinine, Ser: 0.99 mg/dL (ref 0.61–1.24)
Creatinine, Ser: 0.98 mg/dL (ref 0.61–1.24)
Creatinine, Ser: 0.89 mg/dL (ref 0.61–1.24)
GFR, Estimated: >60 mL/min (ref >60)
GFR, Estimated: >60 mL/min (ref >60)
GFR, Estimated: >60 mL/min (ref >60)
Glucose, Bld: 219 mg/dL — ABNORMAL HIGH (ref 70–99)
Glucose, Bld: 170 mg/dL — ABNORMAL HIGH (ref 70–99)
Glucose, Bld: 122 mg/dL — ABNORMAL HIGH (ref 70–99)
Potassium: 3.2 mmol/L — ABNORMAL LOW (ref 3.5–5.1)
Potassium: 3.4 mmol/L — ABNORMAL LOW (ref 3.5–5.1)
Potassium: 4.1 mmol/L (ref 3.5–5.1)
Sodium: 128 mmol/L — ABNORMAL LOW (ref 135–145)
Sodium: 130 mmol/L — ABNORMAL LOW (ref 135–145)
Sodium: 131 mmol/L — ABNORMAL LOW (ref 135–145)

## 2020-03-25 LAB — APTT
aPTT: 77 seconds — ABNORMAL HIGH (ref 24–36)
aPTT: 85 seconds — ABNORMAL HIGH (ref 24–36)

## 2020-03-25 LAB — HEPATIC FUNCTION PANEL
ALT: 43 U/L (ref 0–44)
AST: 55 U/L — ABNORMAL HIGH (ref 15–41)
Albumin: 3.5 g/dL (ref 3.5–5.0)
Alkaline Phosphatase: 149 U/L — ABNORMAL HIGH (ref 38–126)
Bilirubin, Direct: 0.4 mg/dL — ABNORMAL HIGH (ref 0.0–0.2)
Indirect Bilirubin: 1.3 mg/dL — ABNORMAL HIGH (ref 0.3–0.9)
Total Bilirubin: 1.7 mg/dL — ABNORMAL HIGH (ref 0.3–1.2)
Total Protein: 6 g/dL — ABNORMAL LOW (ref 6.5–8.1)

## 2020-03-25 LAB — CBC
HCT: 25.9 % — ABNORMAL LOW (ref 39.0–52.0)
HCT: 27.3 % — ABNORMAL LOW (ref 39.0–52.0)
Hemoglobin: 8.9 g/dL — ABNORMAL LOW (ref 13.0–17.0)
Hemoglobin: 9.1 g/dL — ABNORMAL LOW (ref 13.0–17.0)
MCH: 31.2 pg (ref 26.0–34.0)
MCH: 30.3 pg (ref 26.0–34.0)
MCHC: 34.4 g/dL (ref 30.0–36.0)
MCHC: 33.3 g/dL (ref 30.0–36.0)
MCV: 90.9 fL (ref 80.0–100.0)
MCV: 91 fL (ref 80.0–100.0)
Platelets: 150 10*3/uL (ref 150–400)
Platelets: 149 10*3/uL — ABNORMAL LOW (ref 150–400)
RBC: 2.85 MIL/uL — ABNORMAL LOW (ref 4.22–5.81)
RBC: 3 MIL/uL — ABNORMAL LOW (ref 4.22–5.81)
RDW: 16.5 % — ABNORMAL HIGH (ref 11.5–15.5)
RDW: 16.2 % — ABNORMAL HIGH (ref 11.5–15.5)
WBC: 13.5 10*3/uL — ABNORMAL HIGH (ref 4.0–10.5)
WBC: 13.7 10*3/uL — ABNORMAL HIGH (ref 4.0–10.5)
nRBC: 0 % (ref 0.0–0.2)
nRBC: 0 % (ref 0.0–0.2)

## 2020-03-25 LAB — LACTATE DEHYDROGENASE: LDH: 1117 U/L — ABNORMAL HIGH (ref 98–192)

## 2020-03-25 LAB — FIBRINOGEN: Fibrinogen: 545 mg/dL — ABNORMAL HIGH (ref 210–475)

## 2020-03-25 LAB — PROTIME-INR
INR: 1.9 — ABNORMAL HIGH (ref 0.8–1.2)
Prothrombin Time: 21.2 seconds — ABNORMAL HIGH (ref 11.4–15.2)

## 2020-03-25 LAB — GLUCOSE, CAPILLARY
Glucose-Capillary: 248 mg/dL — ABNORMAL HIGH (ref 70–99)
Glucose-Capillary: 224 mg/dL — ABNORMAL HIGH (ref 70–99)
Glucose-Capillary: 251 mg/dL — ABNORMAL HIGH (ref 70–99)
Glucose-Capillary: 207 mg/dL — ABNORMAL HIGH (ref 70–99)
Glucose-Capillary: 144 mg/dL — ABNORMAL HIGH (ref 70–99)
Glucose-Capillary: 152 mg/dL — ABNORMAL HIGH (ref 70–99)

## 2020-03-25 LAB — URINE CULTURE: Culture: NO GROWTH

## 2020-03-25 LAB — LACTIC ACID, PLASMA
Lactic Acid, Venous: 1.6 mmol/L (ref 0.5–1.9)
Lactic Acid, Venous: 1.4 mmol/L (ref 0.5–1.9)

## 2020-03-25 MED ORDER — FUROSEMIDE 10 MG/ML IJ SOLN
40.0000 mg | Freq: Every day | INTRAMUSCULAR | Status: DC
  Administered 2020-03-25: 40 mg via INTRAVENOUS
  Filled 2020-03-25: qty 4

## 2020-03-25 MED ORDER — LEVALBUTEROL HCL 1.25 MG/0.5ML IN NEBU
INHALATION_SOLUTION | RESPIRATORY_TRACT | Status: AC
  Filled 2020-03-25: qty 0.5

## 2020-03-25 MED ORDER — INSULIN ASPART 100 UNIT/ML ~~LOC~~ SOLN
0.0000 [IU] | SUBCUTANEOUS | Status: DC
  Administered 2020-03-25: 5 [IU] via SUBCUTANEOUS
  Administered 2020-03-25: 8 [IU] via SUBCUTANEOUS
  Administered 2020-03-25: 3 [IU] via SUBCUTANEOUS
  Administered 2020-03-25: 2 [IU] via SUBCUTANEOUS
  Administered 2020-03-26 (×5): 3 [IU] via SUBCUTANEOUS
  Administered 2020-03-27: 11 [IU] via SUBCUTANEOUS
  Administered 2020-03-27 (×2): 8 [IU] via SUBCUTANEOUS
  Administered 2020-03-27: 5 [IU] via SUBCUTANEOUS
  Administered 2020-03-27: 2 [IU] via SUBCUTANEOUS
  Administered 2020-03-28: 8 [IU] via SUBCUTANEOUS
  Administered 2020-03-28 (×2): 15 [IU] via SUBCUTANEOUS
  Administered 2020-03-28: 8 [IU] via SUBCUTANEOUS
  Administered 2020-03-28: 5 [IU] via SUBCUTANEOUS

## 2020-03-25 MED ORDER — LEVALBUTEROL HCL 1.25 MG/0.5ML IN NEBU
1.2500 mg | INHALATION_SOLUTION | Freq: Once | RESPIRATORY_TRACT | Status: AC
  Administered 2020-03-25: 1.25 mg via RESPIRATORY_TRACT

## 2020-03-25 MED ORDER — POTASSIUM CHLORIDE 10 MEQ/50ML IV SOLN
10.0000 meq | INTRAVENOUS | Status: AC
  Administered 2020-03-25 (×6): 10 meq via INTRAVENOUS
  Filled 2020-03-25 (×6): qty 50

## 2020-03-25 MED ORDER — ALBUMIN HUMAN 25 % IV SOLN
12.5000 g | INTRAVENOUS | Status: DC | PRN
  Administered 2020-03-25 – 2020-03-30 (×7): 12.5 g via INTRAVENOUS
  Filled 2020-03-25 (×7): qty 50

## 2020-03-25 MED ORDER — ACETAZOLAMIDE 250 MG PO TABS
250.0000 mg | ORAL_TABLET | Freq: Two times a day (BID) | ORAL | Status: AC
  Administered 2020-03-25 – 2020-03-26 (×3): 250 mg
  Filled 2020-03-25 (×3): qty 1

## 2020-03-25 MED ORDER — METHYLNALTREXONE BROMIDE 12 MG/0.6ML ~~LOC~~ SOLN
12.0000 mg | Freq: Once | SUBCUTANEOUS | Status: AC
  Administered 2020-03-25: 12 mg via SUBCUTANEOUS
  Filled 2020-03-25: qty 0.6

## 2020-03-25 NOTE — Progress Notes (Addendum)
   Palliative Medicine Inpatient Follow Up Note  HPI:49 y.o. male  with no significant past medical history admitted on 03/04/2020 with dyspnea, cough, nausea/vomiting ~ 1 week ago with worsening symptoms of body aches and fatigue and +COVID 02/07/20 and admitted with shortness of breath. Required intubation 03/10/20. Cannulated for VV ECMO 03/10/2020. Oxygenating better with ECMO. Also evidence of RLE DVT, RV thrombus, and high suspicion of PE. Has required chest tube to L lung for collapse which needs further reposition with recurrent collapse. Trach placed 03-18-20 Continue signs of improvement, working with therapies, increased communication, able to wean sweep to 2, ongoing adjustments to minimize  anxiety   Today's Discussion (03/25/2020):  Chart reviewed.   Collaborated case with team   Awake and able to communicate.  Patient  is working with therapies.  Plan is for Speech to coordinate trial of Passy-Muir valve with son's visit to facilitate communication.   I spoke to [name]/son by telephone and updated him on his father's condition.    Questions and concerns addressed   Discussed the importance of continued conversation with his support people (Josh tells me that the patient's brother and best friend have been a great support to him through this difficult time )and the  medical providers regarding overall plan of care and treatment options, ensuring decisions are within the context of the patients values and GOCs.   SUMMARY OF RECOMMENDATIONS   Full Code / Full Scope of treatment  ECMO as a bridge to recovery  Ongoing Palliative care support  Spiritual Support - Christian, discussed with Stephanie Acre  Chaplain with PMT  Time Spent: 15 Greater than 50% of the time was spent in counseling and coordination of care ______________________________________________________________________________________ Lorinda Creed NP [facility] Palliative Medicine Team Team Cell Phone:  [phone] Please utilize secure chat with additional questions, if there is no response within 30 minutes please call the above phone number  Palliative Medicine Team providers are available by phone from 7am to 7pm daily and can be reached through the team cell phone.  Should this patient require assistance outside of these hours, please call the patient's attending physician.

## 2020-03-25 NOTE — Progress Notes (Signed)
SLP Cancellation Note  Patient Details Name: Alex [name] MRN: [id number] DOB: 03/14/1970   Cancelled treatment:       Reason Eval/Treat Not Completed: Other (comment). Unable to coordinate with RT for inline PMSV today. Lysle Pearl aware as we had planned to coordinate care with his visit. Will try again for tomorrow, tentatively at 10:00 am   Josselyn Harkins, Riley Nearing 03/25/2020, 12:33 PM

## 2020-03-25 NOTE — Progress Notes (Signed)
Patient ID: Alex [name], male   DOB: 1971-01-10, 50 y.o.   MRN: [id number]    Advanced Heart Failure Rounding Note  PCP-Cardiologist: No primary care provider on file.   Subjective:    - 12/30: VV ECMO cannulation - 12/31: Left chest tube replaced - 1/2: Extubated. Echo with EF 60-65%, mildly dilated RV with mildly decreased systolic function.  - 1/4: Agitated, suspected aspiration.  Re-intubated.  - 1/5: ECMO cannula repositioned under TEE guidance. TEE showed moderately dilated/moderate-severely dysfunctional RV in setting of hypoxemia. LUE DVT found.  Patient got 1/2 dose of TPA due to initial concern for large PE.  - 1/6: Tracheostomy - 1/7: Echo with mild RV dilation/mild RV dysfunction.   Remains on lasix gtt at 4. Got one dose of metoalzone yesterday. Excellent urine output. Weight down 8 pounds.   Precedex turned off. Less agitated.   Was febrile yesterday. Pan cultured.   Post gas paO2 300  Failed sweep trial yesterday with severe bradycardia and hypoxic  CXR with severe diffuse infiltrates no change Personally reviewed  Had good BM yesterday. Still of TFs.   Stood 3 times.  ECMO parameters: 3200 rpm Flow 4.2 L/min Pvenous -57 Delta P 24 Sweep 2   ABG 7.41/69/73/94% LDH 410 => 543 => 728 => 1041 => 998 => 1305 => 934 => 959 => 1009 => 893 => 901 => 1078 => 1,170 => 1,117  Lactate 1.6  On vent 50% TV 300-400  Objective:   Weight Range: 77 kg Body mass index is 29.14 kg/m.   Vital Signs:   Temp:  [97.8 F (36.6 C)-99.1 F (37.3 C)] 98.5 F (36.9 C) (01/13 0000) Pulse Rate:  [99-143] 110 (01/13 0700) Resp:  [17-43] 21 (01/13 0700) BP: (106-198)/(69-96) 121/78 (01/13 0700) SpO2:  [89 %-100 %] 97 % (01/13 0700) Arterial Line BP: (140-243)/(62-92) 185/70 (01/13 0700) FiO2 (%):  [45 %-50 %] 45 % (01/13 0257) Weight:  [77 kg] 77 kg (01/13 0700) Last BM Date: 03/24/20  Weight change: Filed Weights   03/23/20 0500 03/24/20 0418 03/25/20 0700   Weight: 79.8 kg 80.7 kg 77 kg    Intake/Output:   Intake/Output Summary (Last 24 hours) at 03/25/2020 0748 Last data filed at 03/25/2020 0700 Gross per 24 hour  Intake 5111.39 ml  Output 7390 ml  Net -2278.61 ml      Physical Exam    General:  Awake. Anxious follows commands HEENT: normal Neck: supple. RIJ ECMO + trach Carotids 2+ bilat; no bruits. No lymphadenopathy or thryomegaly appreciated. Cor: PMI nondisplaced. Regular rate & rhythm. No rubs, gallops or murmurs. Lungs: tachypneic coarse  Abdomen: soft, nontender, nondistended. No hepatosplenomegaly. No bruits or masses. Good bowel sounds. Extremities: no cyanosis, clubbing, rash, 1+ edema Neuro: awake follows commands   Telemetry   NSR 100-110 Personally reviewed  Labs    CBC Recent Labs    03/24/20 1722 03/24/20 1724 03/25/20 0504 03/25/20 0514  WBC 13.4*  --  13.5*  --   HGB 9.0*   < > 8.9* 9.2*  HCT 26.3*   < > 25.9* 27.0*  MCV 91.3  --  90.9  --   PLT 126*  --  150  --    < > = values in this interval not displayed.   Basic Metabolic Panel Recent Labs    03/23/20 0424 03/23/20 0801 03/24/20 1722 03/24/20 1724 03/25/20 0504 03/25/20 0514  NA 132*   < > 129*   < > 128* 127*  K 3.8   < >  3.7   < > 3.2* 3.2*  CL 87*   < > 83*  --  78*  --   CO2 34*   < > 33*  --  37*  --   GLUCOSE 61*   < > 224*  --  219*  --   BUN 24*   < > 23*  --  19  --   CREATININE 0.76   < > 0.96  --  0.99  --   CALCIUM 8.0*   < > 8.3*  --  8.5*  --   MG 2.1  --   --   --   --   --    < > = values in this interval not displayed.   Liver Function Tests Recent Labs    03/24/20 0437 03/25/20 0504  AST 66* 55*  ALT 43 43  ALKPHOS 157* 149*  BILITOT 2.1* 1.7*  PROT 5.2* 6.0*  ALBUMIN 3.0* 3.5   No results for input(s): LIPASE, AMYLASE in the last 72 hours. Cardiac Enzymes No results for input(s): CKTOTAL, CKMB, CKMBINDEX, TROPONINI in the last 72 hours.  BNP: BNP (last 3 results) No results for input(s): BNP  in the last 8760 hours.  ProBNP (last 3 results) No results for input(s): PROBNP in the last 8760 hours.   D-Dimer No results for input(s): DDIMER in the last 72 hours. Hemoglobin A1C No results for input(s): HGBA1C in the last 72 hours. Fasting Lipid Panel No results for input(s): CHOL, HDL, LDLCALC, TRIG, CHOLHDL, LDLDIRECT in the last 72 hours. Thyroid Function Tests No results for input(s): TSH, T4TOTAL, T3FREE, THYROIDAB in the last 72 hours.  Invalid input(s): FREET3  Other results:   Imaging    DG Abd 1 View  Result Date: 03/25/2020 CLINICAL DATA:  Tracheostomy.  ECMO.  Evaluation for ileus. EXAM: ABDOMEN - 1 VIEW COMPARISON:  Abdomen 03/22/2020. FINDINGS: ECMO device noted stable position. Feeding tube noted coiled over the stomach. Slightly distended loops of small and large bowel noted. Findings suggest adynamic ileus. No free air identified. Bibasilar pulmonary infiltrates. Right pleural effusion. No acute bony abnormality. IMPRESSION: 1. ECMO device noted stable position. Feeding tube noted coiled over the stomach. 2. Slightly distended loops of small and large bowel noted. Findings suggest adynamic ileus. 3. Bibasilar pulmonary infiltrates. Right pleural effusion. Electronically Signed   By: Maisie Fus  Register   On: 03/25/2020 06:41   DG CHEST PORT 1 VIEW  Result Date: 03/25/2020 CLINICAL DATA:  Tracheostomy.  ECMO.  COVID positive. EXAM: PORTABLE CHEST 1 VIEW COMPARISON:  03/24/2020. FINDINGS: Tracheostomy tube, feeding tube, left central line, left chest tube in stable position. ECMO device in stable position. Heart size stable. Diffuse severe bilateral pulmonary infiltrates/edema again noted without interim change. No prominent pleural effusion. No pneumothorax. IMPRESSION: 1. Lines and tubes including left chest tube in stable position. ECMO device in stable position. No pneumothorax. 2. Diffuse severe bilateral pulmonary infiltrates/edema again noted without interim  change. Electronically Signed   By: Maisie Fus  Register   On: 03/25/2020 06:46     Medications:     Scheduled Medications: . sodium chloride   Intravenous Once  . chlorhexidine gluconate (MEDLINE KIT)  15 mL Mouth Rinse BID  . Chlorhexidine Gluconate Cloth  6 each Topical Daily  . clonazePAM  1.5 mg Per Tube Q6H  . cloNIDine  0.2 mg Per Tube Q8H  . docusate  100 mg Per Tube BID  . guaiFENesin-dextromethorphan  10 mL Per Tube BID  . living  well with diabetes book   Does not apply Once  . mouth rinse  15 mL Mouth Rinse 10 times per day  . metoCLOPramide (REGLAN) injection  5 mg Intravenous Q8H  . metoprolol tartrate  25 mg Per Tube BID  . midazolam  5 mg Intravenous Once  . neostigmine  0.25 mg Subcutaneous Q6H  . oxyCODONE  20 mg Per Tube Q6H  . pantoprazole sodium  40 mg Per Tube QHS  . polyethylene glycol  17 g Per Tube BID  . predniSONE  30 mg Per Tube Q breakfast   Followed by  . [START ON 03/26/2020] predniSONE  20 mg Per Tube Q breakfast   Followed by  . [START ON 03/29/2020] predniSONE  10 mg Per Tube Q breakfast  . QUEtiapine  150 mg Per Tube TID  . sennosides  10 mL Per Tube BID  . sodium chloride flush  10-40 mL Intracatheter Q12H    Infusions: . sodium chloride    . sodium chloride Stopped (03/23/20 0951)  . sodium chloride 10 mL/hr at 03/24/20 1100  . sodium chloride Stopped (03/12/20 0131)  . albumin human 12.5 g (03/25/20 0231)  . bivalirudin (ANGIOMAX) infusion 0.5 mg/mL (Non-ACS indications) 0.22 mg/kg/hr (03/25/20 0143)  . dexmedetomidine (PRECEDEX) IV infusion Stopped (03/24/20 1040)  . dextrose 10 mL/hr at 03/25/20 0619  . feeding supplement (PIVOT 1.5 CAL) Stopped (03/21/20 0800)  . fentaNYL infusion INTRAVENOUS 200 mcg/hr (03/25/20 0427)  . furosemide (LASIX) 200 mg in dextrose 5% 100 mL ($Remov'2mg'mgOzna$ /mL) infusion 4 mg/hr (03/24/20 1100)  . potassium chloride 10 mEq (03/25/20 0744)    PRN Medications: Place/Maintain arterial line **AND** sodium chloride, sodium  chloride, sodium chloride, acetaminophen (TYLENOL) oral liquid 160 mg/5 mL, albumin human, chlorpheniramine-HYDROcodone, dextrose, fentaNYL (SUBLIMAZE) injection, guaiFENesin, haloperidol lactate, hydrALAZINE, labetalol, lip balm, LORazepam, ondansetron (ZOFRAN) IV, oxyCODONE, sodium chloride flush   Assessment/Plan   1. Acute hypoxemic respiratory failure: Due to COVID-19 PNA with bilateral infiltrates.  Refractory hypoxemia, VV-ECMO cannulation on 02/21/2020 with improvement in oxygenation.  Developed left PTX post-subclavian CVL and has left chest tube, the left lung is re-expanded.  He was extubated 1/2 but reintubated 1/4 with agitation and suspected aspiration.  Tracheostomy 1/6.  CXR with bilateral infiltrates, improved today.  He is now on cefepime for empiric abx coverage.  ECMO cannula repositioned 1/5.  LDH stable 1000-1100   - Patient has had remdesivir, tocilizumab. - Ongoing steroids with prednisone.  - ECMO circuit functioning appropriately despite high LDH. Several clots in oxygenator. Post oxygenator gas ok on 1/12 - Continue bivalirudin, goal PTT 60-80. He is at 3 today Discussed dosing with PharmD personally. With HTN try to keep at lower end of range - Off cefepime 1 (stopped 1/12) -> Remains with low-grade temps. Recultured 1/12. NGTD. Follow closely.  - Failed sweep trial 1/12. Lungs still with diffuse infiltrates on CXR. Will need more time. Increase sweep as needed today - Volume status improved. Switch lasix to $Remove'40mg'PrbboDT$  IV daily to limit fluid. Continue TED hose. Can repeat metolazone as needed. Will give diamox x 3 doses 2. RLE DVT/LUE DVT/thrombus in RV/suspect PE: Echo with moderately dilated and moderately dysfunctional RV.  Clot noted on TEE in RV as well.  TTE 1/2 showed normal EF 60-65%, RV improved (mildly dilated/dysfunctional). TEE on 1/5 with moderate to severe RV dysfunction but patient was hypoxemic.  Had 1/2 dose TPA on 1/5. Echo 1/7 with mildly dilated/mildly  dysfunctional RV.  - Bivalirudin for goal PTT 60-80.  Management  as above 3. Left PTX: Left chest tube, lung is re-expanded.   4. Shock: Suspect septic/distributive.  Now resolved, off NE.  5. Anemia: Hgb 8.9, transfuse < 8.   6. AKI: Resolved.  7. Hyperglycemia: insulin.  8. HTN: Following cuff pressure (whip in ABG). BP remains very labile. meds adjusted 9. CHB: Episode of CHB when hypoxemic.  NSR since then.  - With elevated HR, will continue  metoprolol 25 mg bid but follow closely.  10. Thrombocytopenia: Mild, stable. 11. Ileus: TFs on hold.  - Getting Reglan and neostigmine.  - Continue Relastor and sorbitol - Start trickle feeds 12. Ischemic digits - follow   CRITICAL CARE Performed by: Glori Bickers  Total critical care time: 40  minutes  Critical care time was exclusive of separately billable procedures and treating other patients.  Critical care was necessary to treat or prevent imminent or life-threatening deterioration.  Critical care was time spent personally by me on the following activities: development of treatment plan with patient and/or surrogate as well as nursing, discussions with consultants, evaluation of patient's response to treatment, examination of patient, obtaining history from patient or surrogate, ordering and performing treatments and interventions, ordering and review of laboratory studies, ordering and review of radiographic studies, pulse oximetry and re-evaluation of patient's condition.    Length of Stay: [place], MD  03/25/2020, 7:48 AM  Advanced Heart Failure Team Pager [phone] (M-F; 7a - 4p)  Please contact [facility] for night-coverage after hours (4p -7a ) and weekends on [email or web address]

## 2020-03-25 NOTE — Progress Notes (Signed)
ECMO PROGRESS NOTE  NAME:  Alex [name], MRN:  [id number], DOB:  03-16-1970, LOS: 16 ADMISSION DATE:  03/14/20, CONSULTATION DATE: 02/20/2020 REFERRING MD: Wynona Neat -LBPCCM, CHIEF COMPLAINT: Respiratory failure requiring ECMO  HPI/course in hospital  50 year old man admitted to hospital 12/28 with 1 week history of dyspnea cough nausea and vomiting.  Initially admitted to [facility] long hospital and placed on high flow nasal cannula but rapidly failed and required intubation 12/29.  Persistent hypoxic respiratory failure with PF ratio 55 in spite of 18 of PEEP FiO2 0.1 despite paralytics.  Did not improve with prone ventilation  Cannulated for VV ECMO 12/30 via right IJ crescent cannula. Iatrogenic pneumothorax from left subclavian triple-lumen placement  Family confirms no significant past medical history apart from possible prediabetes  Past Medical History  none  Interim history/subjective:  1/13: no great issues overnight. Stopping lasix infusion and dosing daily. Will give dose of acetazolamide. Fever down and normothermic now. precedex off and this has greatly helped his mentation. Also started having bm 1/12: awake communicative just stood multiple times and so is tachypneic and fatigues. Febrile this am to 99.1, turn down heater temp for now, pan cx but pt has been on broad abx with vanc just stopping Monday and cefepime ongoing. Will aos attempt to stop precedex as can contribute and if able to settle out will wean sweep and see. Circuit will likely need change in next few days if unable to come off.  1/11: awake and able to communicate. Able to wean to 2 sweep. Still with good diuresis. Able to speak with in line speaking valve.  1/10: still on escalated sedation. Will increase po to wean continuous, wbc normal. Lactate normal. Will stop vanc (neg pcr)  Objective   Blood pressure (!) 166/82, pulse (!) 119, temperature 98.5 F (36.9 C), temperature source Oral, resp. rate 17,  height 5\' 4"  (1.626 m), weight 77 kg, SpO2 93 %.    Vent Mode: PCV FiO2 (%):  [45 %-50 %] 45 % Set Rate:  [10 bmp] 10 bmp PEEP:  [10 cmH20] 10 cmH20   Intake/Output Summary (Last 24 hours) at 03/25/2020 0910 Last data filed at 03/25/2020 0900 Gross per 24 hour  Intake 4831.97 ml  Output 7810 ml  Net -2978.03 ml   Filed Weights   03/23/20 0500 03/24/20 0418 03/25/20 0700  Weight: 79.8 kg 80.7 kg 77 kg    ECMO Device: Cardiohelp  ECMO Mode: VV  Flow (LPM): 4.29   Examination: General: critically ill appearing man laying in bed in NAD, on MV via trach and ECMO HEENT: /AT, eyes anicteric, oral mucosa moist. Cortrak left nare. Neck: RIJ ECMO cannula, trach w/o bleeding. Cardio: S1S2, RRR Respiratory: diminished bilaterally Abdomen distended, but soft and nontender, tympanitic to percussion, bowel sounds + Extremities: left hand with cyanotic digits x 2-3 (index finger with dry gangrene changes),purple toe left first toe, R radial Aline. Pitting edema diffusely (anasarca) with blistering as well Derm : no rashes or wounds Neuro: RASS -1    CXR personally reviewed- dense infiltrates persist, trach and ECMO cannnula in appropriate positions  Ancillary tests (personally reviewed)  CBC: Recent Labs  Lab 03/23/20 0424 03/23/20 0801 03/23/20 1551 03/23/20 1600 03/24/20 0437 03/24/20 0844 03/24/20 1722 03/24/20 1724 03/24/20 1743 03/24/20 1752 03/24/20 1957 03/25/20 0504 03/25/20 0514  WBC 9.5  --  17.3*  --  12.0*  --  13.4*  --   --   --   --  13.5*  --  HGB 9.2*   < > 9.7*   < > 8.9*   < > 9.0*   < > 9.2* 9.2* 9.2* 8.9* 9.2*  HCT 28.3*   < > 30.2*   < > 26.2*   < > 26.3*   < > 27.0* 27.0* 27.0* 25.9* 27.0*  MCV 93.1  --  93.2  --  91.9  --  91.3  --   --   --   --  90.9  --   PLT 126*  --  148*  --  127*  --  126*  --   --   --   --  150  --    < > = values in this interval not displayed.    Basic Metabolic Panel: Recent Labs  Lab 03/21/20 1624 03/21/20 1634  03/22/20 0324 03/22/20 0604 03/23/20 0424 03/23/20 0801 03/23/20 1551 03/23/20 1600 03/24/20 0437 03/24/20 0844 03/24/20 1722 03/24/20 1724 03/24/20 1743 03/24/20 1752 03/24/20 1957 03/25/20 0504 03/25/20 0514  NA 135   < > 136   < > 132*   < > 129*   < > 131*   < > 129*   < > 127* 127* 126* 128* 127*  K 5.1   < > 3.8   < > 3.8   < > 5.1   < > 4.0   < > 3.7   < > 3.6 4.3 3.6 3.2* 3.2*  CL 91*  --  90*   < > 87*  --  87*  --  88*  --  83*  --   --   --   --  78*  --   CO2 33*  --  36*   < > 34*  --  32  --  33*  --  33*  --   --   --   --  37*  --   GLUCOSE 106*  --  54*   < > 61*  --  200*  --  88  --  224*  --   --   --   --  219*  --   BUN 32*  --  30*   < > 24*  --  27*  --  23*  --  23*  --   --   --   --  19  --   CREATININE 0.94  --  0.86   < > 0.76  --  0.90  --  0.83  --  0.96  --   --   --   --  0.99  --   CALCIUM 7.8*  --  7.9*   < > 8.0*  --  8.0*  --  7.9*  --  8.3*  --   --   --   --  8.5*  --   MG 1.9  --  1.9  --  2.1  --   --   --   --   --   --   --   --   --   --   --   --   PHOS 4.3  --  5.1*  --   --   --   --   --   --   --   --   --   --   --   --   --   --    < > = values in this interval not displayed.   GFR: Estimated Creatinine Clearance: 86.5 mL/min (  by C-G formula based on SCr of 0.99 mg/dL). Recent Labs  Lab 03/23/20 1551 03/24/20 0437 03/24/20 1722 03/24/20 1723 03/25/20 0504  WBC 17.3* 12.0* 13.4*  --  13.5*  LATICACIDVEN 1.4 0.7  --  1.2 1.6    Liver Function Tests: Recent Labs  Lab 03/21/20 0337 03/22/20 0324 03/23/20 0424 03/24/20 0437 03/25/20 0504  AST 46* 48* 67* 66* 55*  ALT 31 33 35 43 43  ALKPHOS 85 81 122 157* 149*  BILITOT 1.3* 1.7* 1.7* 2.1* 1.7*  PROT 4.9* 5.2* 5.2* 5.2* 6.0*  ALBUMIN 3.2* 3.0* 3.0* 3.0* 3.5   No results for input(s): LIPASE, AMYLASE in the last 168 hours. No results for input(s): AMMONIA in the last 168 hours.  ABG    Component Value Date/Time   PHART 7.412 03/25/2020 0514   PCO2ART 69.1 (HH)  03/25/2020 0514   PO2ART 73 (L) 03/25/2020 0514   HCO3 44.1 (H) 03/25/2020 0514   TCO2 46 (H) 03/25/2020 0514   ACIDBASEDEF 1.0 03/17/2020 1608   O2SAT 94.0 03/25/2020 0514     Coagulation Profile: Recent Labs  Lab 03/21/20 0337 03/22/20 0324 03/23/20 0424 03/24/20 0437 03/25/20 0504  INR 1.9* 1.9* 1.8* 1.9* 1.9*    Cardiac Enzymes: No results for input(s): CKTOTAL, CKMB, CKMBINDEX, TROPONINI in the last 168 hours.  HbA1C: Hgb A1c MFr Bld  Date/Time Value Ref Range Status  03/03/2020 06:10 PM 11.8 (H) 4.8 - 5.6 % Final    Comment:    (NOTE) Pre diabetes:          5.7%-6.4%  Diabetes:              >6.4%  Glycemic control for   <7.0% adults with diabetes   02/23/2020 08:30 AM 12.2 (H) 4.8 - 5.6 % Final    Comment:    (NOTE) Pre diabetes:          5.7%-6.4%  Diabetes:              >6.4%  Glycemic control for   <7.0% adults with diabetes     CBG: Recent Labs  Lab 03/24/20 1129 03/24/20 1954 03/24/20 2309 03/25/20 0512 03/25/20 0846  GLUCAP 225* 241* 248* 224* 251*     Assessment & Plan:   Acute hypoxic and hypercarbic respiratory failure requiring VV ECMO support and mechanical ventilation. ARDS due to COVID-19 viral pneumonia Pneumothorax on left s/p chest tube Bilateral DVT , cardiac clot in transit. Small PE but hemodynamically stable. Concern for RLL pneumonia: s/p abx -Con't ultralung protective ventilation. Ok to cont in line trials on vent with SLP. Should be doing so at 1300 with son today -Con't full ECMO support, weaning as tolerated. -attempting to get pt of isolation as I feel this will greatly improve his progress to not feel so alone. He was symptomatic for > 7 days prior to presentation and had first testing 17 days ago.  Per cdc guidelines HCP withsevere to critical illnessand are notmoderately to severely immunocompromised:  In general, when 20 days have passedsince symptoms first appeared, and  At least 24 hours have  passedsince last feverwithout the use of fever-reducing medications,and  Symptoms (e.g., cough, shortness of breath) have improved.  His symptoms are not likely 2/2 active covid but the residual longer term effects of covid at this point. I have called ID who stated that they agreed with my clinical judgement but needed clarification from IP. IP stated that they "do not follow the cdc guidelines but utilize 21 days  from first test". They recommended that if I desired an exception then I should contact Dr Jerolyn Center for this. I have paged and awaiting call back.    Spoke with Dr Kendrick Fries and we feel and agree with bedside RN that his window of infectivity has passed and his clinical progression would be much improved. He has been symptomatic for >25 days.   -Con't lasix infusion, add dose of metolazone today -Con't chest tube to suction until off vent -Routine trach care -Complete courses of remdesivir, Actemra. -steroid taper in -Completed empiric courses of antibiotics for community-acquired pneumonia. - S/p 8 days vanc  -cefepime for 10 days to end today but with fever again will pan cx and perhaps have to change to merrem -Will need 3 months of anticoagulation for provoked thromboembolic disease. -cont seroquel and klonopin to help dyspnea and anxiety to work on coming off infusions. -successfully off precautions - Con't enteral oxycodone. -con't steroid taper  Agitation-improved now back on mechanical ventilation. -off precedex and looks much better mentally -cont enteral and prn iv as needed -Seroquel, qtc ok  -Con't oxycodone  - Klonopin to 1.5 mg Q6h -Haldol as needed  dm2 with hyperglycemia PTA (A1c 12.2) -off d10 , resuming sliding scale -goal BG 140-180 -steroid wean   Acute anemia; suspect hemolytic. LDH high -will likely need circuit change in next few days if unable to wean off soon -failed sweep trial yesterday Thrombocytopenia; remains stable -Transfuse for  hemoglobin less than 8 or hemodynamically significant bleeding. -Continue to monitor platelets   Leukocytosis-Concern for right lower lobe pneumonia -blood cx with variant strains of staph, completed 8 days vanc -cefepime completed -sputum negative -wbc stable LGT:  -tmax 99.1 earlier in day yesterday no more  -pan cx with consideration of broadening abx for anaerobic coverage: ngtd -wbc stable   Complete heart block; associated with hypoxia due to cannula positioning issues -Resolved -resumed metoprolol 1/11 without issue  FEN -starting trickle -resume ssi ileus -cont reglan and bowel regimen -relistor today again an then stop -with bm but ileus remains -start trickle feeds  Deconditioning -cont PT, OT, SLP -motivated  Daily Goals Checklist  Pain/Anxiety/Delirium protocol (if indicated): enteral oxycodone and clonazepam,  seroquel, clonidine VAP protocol (if indicated):trach DVT prophylaxis: Systemic bivalirudin Nutritional status and feeding goals: High nutritional risk, tube feeds via core track GI prophylaxis: Pantoprazole Urinary catheter: Assessment of intravascular volume Central lines: Right IJ crescent, left subclavian triple-lumen, right radial arterial line Glucose control:  Monitoring until resumption of tf Mobility/therapy needs: progressive mobility Code Status: Full Family Communication: updated son daily Last GoC meeting 03/18/19 with brother, son, friend> con't aggressive care  Multidisciplinary ECMO rounds with cardiology, pharmacy, RNs, ECMO specialists, and PCCM.  Critical care time: The patient is critically ill with multiple organ systems failure and requires high complexity decision making for assessment and support, frequent evaluation and titration of therapies, application of advanced monitoring technologies and extensive interpretation of multiple databases.  Critical care time 40 mins. This represents my time independent of the NPs time taking  care of the pt. This is excluding procedures.    Briant Sites DO [facility] 03/25/2020, 9:10 AM

## 2020-03-25 NOTE — Procedures (Signed)
Extracorporeal support note  ECLS cannulation date: 12/30 ECLS support day: 13 Last circuit change: n/a  Indication: Severe respiratory failure secondary to COVID-19 pneumonia with RV dysfunction  Configuration: Venovenous  Drainage cannula: 32 French crescent cannula via right IJ Return cannula: Same  Pump speed: 3350 RPM Pump flow: 4.2 L/min Pump used: Cardio help  Oxygenator: Cardio help O2 blender: 100% Sweep gas: 2L  Circuit check: clot at corners, especially the left- stable Anticoagulant: Bivalirudin Anticoagulation targets: PTT 60-80  Changes in support: adding acetazolamide with rising bicarb. Will monitor and adjust sweep for co2. Stop lasix infusion and change to daily dosing. Will come off precautions today with symptoms >25days which should improve anxiety. Maintain off precedex, no elevated temps since this has been stopped.   Anticipated goals/duration of support: Bridge to recovery.  Multidisciplinary ECMO rounds completed.   Briant Sites, DO 03/25/20 12:50 PM [facility]

## 2020-03-25 NOTE — Progress Notes (Signed)
ANTICOAGULATION CONSULT NOTE - Follow Up Consult  Pharmacy Consult for bivalirudin Indication: ECMO and VTE  Labs: Recent Labs    03/22/20 0324 03/22/20 0604 03/23/20 0424 03/23/20 0801 03/23/20 1551 03/23/20 1600 03/24/20 0437 03/24/20 0844 03/24/20 1722 03/24/20 1724 03/24/20 1743 03/24/20 1752 03/24/20 1957 03/24/20 2304  HGB 9.3*   < > 9.2*   < > 9.7*   < > 8.9*   < > 9.0*   < > 9.2* 9.2* 9.2*  --   HCT 27.7*   < > 28.3*   < > 30.2*   < > 26.2*   < > 26.3*   < > 27.0* 27.0* 27.0*  --   PLT 121*   < > 126*  --  148*  --  127*  --  126*  --   --   --   --   --   APTT 81*   < > 68*  --  71*  --  82*  --  90*  --   --   --   --  70*  LABPROT 20.8*  --  20.0*  --   --   --  21.4*  --   --   --   --   --   --   --   INR 1.9*  --  1.8*  --   --   --  1.9*  --   --   --   --   --   --   --   CREATININE 0.86   < > 0.76  --  0.90  --  0.83  --  0.96  --   --   --   --   --    < > = values in this interval not displayed.    Assessment: 39 yoM admitted with COVID-19 PNA with worsening hypoxia, s/p cannulation for ECMO. Pt was started on IV heparin prior to cannulation due to acute DVTs and possible PE,  transitioned to bivalirudin with ECMO. Now s/p tPA on 1/5 and tracheostomy on 1/6.   APTT came back at 90, supratherapeutic, on bivalirudin@0 .25 mg/kg/hr. Has some flank bruising by CT - unchanged and no increase in CT drainage, Hgb 9.2. Circuit stable with clots.   1/13 AM update:  APTT down to 70 after rate decrease  Goal of Therapy:  aPTT 60-80 seconds   Plan:  Cont bivalirudin at 0.22 mg/kg/hr Monitor CBC, 0500/1700 aPTT, LDH, and for s/sx of bleeding   Abran Duke, PharmD, BCPS Clinical Pharmacist Phone: [phone]

## 2020-03-25 NOTE — Progress Notes (Signed)
ANTICOAGULATION CONSULT NOTE - Follow Up Consult  Pharmacy Consult for bivalirudin Indication: ECMO and VTE  Labs: Recent Labs    03/23/20 0424 03/23/20 0801 03/24/20 0437 03/24/20 0844 03/24/20 1722 03/24/20 1724 03/24/20 2304 03/25/20 0504 03/25/20 0514 03/25/20 1244 03/25/20 1350 03/25/20 1508 03/25/20 1602 03/25/20 1620  HGB 9.2*   < > 8.9*   < > 9.0*   < >  --  8.9*   < >  --    < > 8.8* 9.2* 9.1*  HCT 28.3*   < > 26.2*   < > 26.3*   < >  --  25.9*   < >  --    < > 26.0* 27.0* 27.3*  PLT 126*   < > 127*  --  126*  --   --  150  --   --   --   --   --  149*  APTT 68*   < > 82*  --  90*  --  70* 77*  --   --   --   --   --  85*  LABPROT 20.0*  --  21.4*  --   --   --   --  21.2*  --   --   --   --   --   --   INR 1.8*  --  1.9*  --   --   --   --  1.9*  --   --   --   --   --   --   CREATININE 0.76   < > 0.83  --  0.96  --   --  0.99  --  0.98  --   --   --  0.89   < > = values in this interval not displayed.    Assessment: 58 yoM admitted with COVID-19 PNA with worsening hypoxia, s/p cannulation for ECMO. Pt was started on IV heparin prior to cannulation due to acute DVTs and possible PE,  transitioned to bivalirudin with ECMO. Now s/p tPA on 1/5 and tracheostomy on 1/6.   APTT came back at 85, slightly supratherapeutic, on bivalirudin@ 0.22 mg/kg/hr. No change in flank bruising by CT - unchanged and no increase in CT drainage, Hgb 9.1- stable. Circuit stable - no issues.   Goal of Therapy:  aPTT 60-80 seconds   Plan:  Cont bivalirudin at 0.19 mg/kg/hr Recheck in 4 hours after change Monitor CBC, 0500/1700 aPTT, LDH, and for s/sx of bleeding   Link Snuffer, PharmD, BCPS, BCCCP Clinical Pharmacist Please refer to [facility] for [facility] numbers 03/25/2020, 5:01 PM

## 2020-03-25 NOTE — Progress Notes (Signed)
Inpatient Diabetes Program Recommendations  AACE/ADA: New Consensus Statement on Inpatient Glycemic Control (2015)  Target Ranges:  Prepandial:   less than 140 mg/dL      Peak postprandial:   less than 180 mg/dL (1-2 hours)      Critically ill patients:  140 - 180 mg/dL   Lab Results  Component Value Date   GLUCAP 251 (H) 03/25/2020   HGBA1C 11.8 (H) 02/15/2020    Review of Glycemic Control  Results for Alex [name], Alex [name] (MRN [id number]) as of 03/25/2020 12:59  Ref. Range 03/24/2020 11:29 03/24/2020 19:54 03/24/2020 23:09 03/25/2020 05:12 03/25/2020 08:46  Glucose-Capillary Latest Ref Range: 70 - 99 mg/dL 372 (H) 902 (H) 111 (H) 224 (H) 251 (H)   Diabetes history: DM 2 Outpatient Diabetes medications:  None listed Current orders for Inpatient glycemic control:  Novolog moderate q 4 hours Inpatient Diabetes Program Recommendations:   Consider restarting Levemir 20 units bid.    Thanks  Beryl Meager, RN, BC-ADM Inpatient Diabetes Coordinator Pager [phone] (8a-5p)

## 2020-03-25 NOTE — Progress Notes (Signed)
Nutrition Follow-up  DOCUMENTATION CODES:   Not applicable  INTERVENTION:   Tube Feeding via Cortrak:  Resume trickle TF today Goal rate: Pivot 1.5 to 65 ml/hr Begin TF at rate of 20 ml/hr; titrate by 10 mL q 8 hours until goal rate of 65 Provides 2340 kcals, 146 g of protein and 1186 mL of free water Meets 100% estimated calorie and protein needs   NUTRITION DIAGNOSIS:   Increased nutrient needs related to acute illness,catabolic illness (COVID-19 infection) as evidenced by estimated needs.  Being addressed via TF   GOAL:   Patient will meet greater than or equal to 90% of their needs  Progressing  MONITOR:   Vent status,TF tolerance,Labs,Weight trends  REASON FOR ASSESSMENT:   LOS Enteral/tube feeding initiation and management  ASSESSMENT:   50 y.o. male with no significant medical history. He presented to the ED with dyspnea, cough, and N/V. He reported that his symptoms began with cough 1 week ago. He tried OTC meds but nothing helped. Symptoms worsened to include body aches, fatigue, and N/V 3 days later. He went to his PCP 12/26 and got tested for COVID; he was positive.  12/26 COVID+  12/28 Admitted to [facility] 12/29 Intubated 12/30 Transferred to [facility], VV ECMO cannulation, L PTX with Chest tube insertion 12/31 Cortrak placed, Post-pyloric  1/02 Extubated to HFNC/BiPap as needed, ECHO with EF 60-65% 1/06 TEE for ECMO cannula position, Re-Intubated, Rhinorockets placed for epistaxis, Cortrak malpositioned-repositioned and now gastric per xray 1/07 Trach placed  Remains on VV ECMO day 14, on vent support via trach  Pt has been working with PT, PMV trials on vent with SLP as able  TF held for mild colonic ileus on 1/09. +BMs, on reglan and bowel regimen which includes relistor. Pivot 1.5 starting at 20 ml/hr today  Pt had been receiving D10 at 50 ml/hr for hypoglycemia; now hyperglycemic and resuming trickle TF. D10 stopped  Labs: sodium 128 (L), potassium  wdl Meds: reglan, relistor, miralax, senokot, prednisone, ss novolog, lasix, colace  Diet Order:   Diet Order            Diet NPO time specified  Diet effective midnight                 EDUCATION NEEDS:   Not appropriate for education at this time  Skin:  Skin Assessment: Skin Integrity Issues: Skin Integrity Issues:: DTI DTI: face-forehead  Last BM:  1/13 rectal tube  Height:   Ht Readings from Last 1 Encounters:  03/08/2020 5\' 4"  (1.626 m)    Weight:   Wt Readings from Last 1 Encounters:  03/25/20 77 kg    Ideal Body Weight:  59.1 kg  BMI:  Body mass index is 29.14 kg/m.  Estimated Nutritional Needs:   Kcal:  2160-2520 kcals  Protein:  130-150 g  Fluid:  >/= 2 L   05-30-1990 MS, RDN, LDN, CNSC Registered Dietitian III Clinical Nutrition RD Pager and On-Call Pager Number Located in [place]

## 2020-03-25 NOTE — Progress Notes (Signed)
RT called to patient room due to patient's ventilator alarming for low minute ventilation and tidal volumes that have decreased from 300s to 100s.  Attempted to lavage patient however only able to obtain a small amount of thick white sputum.  Patient auscultated and noted to be diminished on left base.  Gave one time xopenex to see if any improvement.  STAT chest xray ordered and MD notified.  Will continue to monitor.

## 2020-03-26 ENCOUNTER — Inpatient Hospital Stay (HOSPITAL_COMMUNITY): Payer: Medicaid Other

## 2020-03-26 LAB — LACTATE DEHYDROGENASE: LDH: 911 U/L — ABNORMAL HIGH (ref 98–192)

## 2020-03-26 LAB — POCT I-STAT 7, (LYTES, BLD GAS, ICA,H+H)
Acid-Base Excess: 14 mmol/L — ABNORMAL HIGH (ref 0.0–2.0)
Acid-Base Excess: 6 mmol/L — ABNORMAL HIGH (ref 0.0–2.0)
Acid-Base Excess: 6 mmol/L — ABNORMAL HIGH (ref 0.0–2.0)
Acid-Base Excess: 4 mmol/L — ABNORMAL HIGH (ref 0.0–2.0)
Acid-Base Excess: 6 mmol/L — ABNORMAL HIGH (ref 0.0–2.0)
Acid-Base Excess: 2 mmol/L (ref 0.0–2.0)
Acid-Base Excess: 3 mmol/L — ABNORMAL HIGH (ref 0.0–2.0)
Acid-Base Excess: 3 mmol/L — ABNORMAL HIGH (ref 0.0–2.0)
Bicarbonate: 37.9 mmol/L — ABNORMAL HIGH (ref 20.0–28.0)
Bicarbonate: 31.9 mmol/L — ABNORMAL HIGH (ref 20.0–28.0)
Bicarbonate: 31 mmol/L — ABNORMAL HIGH (ref 20.0–28.0)
Bicarbonate: 29.3 mmol/L — ABNORMAL HIGH (ref 20.0–28.0)
Bicarbonate: 31.1 mmol/L — ABNORMAL HIGH (ref 20.0–28.0)
Bicarbonate: 27 mmol/L (ref 20.0–28.0)
Bicarbonate: 28.6 mmol/L — ABNORMAL HIGH (ref 20.0–28.0)
Bicarbonate: 28.4 mmol/L — ABNORMAL HIGH (ref 20.0–28.0)
Calcium, Ion: 1.18 mmol/L (ref 1.15–1.40)
Calcium, Ion: 1.25 mmol/L (ref 1.15–1.40)
Calcium, Ion: 1.26 mmol/L (ref 1.15–1.40)
Calcium, Ion: 1.28 mmol/L (ref 1.15–1.40)
Calcium, Ion: 1.31 mmol/L (ref 1.15–1.40)
Calcium, Ion: 1.27 mmol/L (ref 1.15–1.40)
Calcium, Ion: 1.33 mmol/L (ref 1.15–1.40)
Calcium, Ion: 1.33 mmol/L (ref 1.15–1.40)
HCT: 22 % — ABNORMAL LOW (ref 39.0–52.0)
HCT: 25 % — ABNORMAL LOW (ref 39.0–52.0)
HCT: 25 % — ABNORMAL LOW (ref 39.0–52.0)
HCT: 25 % — ABNORMAL LOW (ref 39.0–52.0)
HCT: 23 % — ABNORMAL LOW (ref 39.0–52.0)
HCT: 22 % — ABNORMAL LOW (ref 39.0–52.0)
HCT: 23 % — ABNORMAL LOW (ref 39.0–52.0)
HCT: 21 % — ABNORMAL LOW (ref 39.0–52.0)
Hemoglobin: 7.5 g/dL — ABNORMAL LOW (ref 13.0–17.0)
Hemoglobin: 8.5 g/dL — ABNORMAL LOW (ref 13.0–17.0)
Hemoglobin: 8.5 g/dL — ABNORMAL LOW (ref 13.0–17.0)
Hemoglobin: 8.5 g/dL — ABNORMAL LOW (ref 13.0–17.0)
Hemoglobin: 7.8 g/dL — ABNORMAL LOW (ref 13.0–17.0)
Hemoglobin: 7.5 g/dL — ABNORMAL LOW (ref 13.0–17.0)
Hemoglobin: 7.8 g/dL — ABNORMAL LOW (ref 13.0–17.0)
Hemoglobin: 7.1 g/dL — ABNORMAL LOW (ref 13.0–17.0)
O2 Saturation: 97 %
O2 Saturation: 92 %
O2 Saturation: 95 %
O2 Saturation: 90 %
O2 Saturation: 99 %
O2 Saturation: 98 %
O2 Saturation: 98 %
O2 Saturation: 97 %
Patient temperature: 98.2
Patient temperature: 99.2
Patient temperature: 37
Patient temperature: 37.5
Patient temperature: 37.5
Potassium: 3 mmol/L — ABNORMAL LOW (ref 3.5–5.1)
Potassium: 3.5 mmol/L (ref 3.5–5.1)
Potassium: 3.9 mmol/L (ref 3.5–5.1)
Potassium: 3.8 mmol/L (ref 3.5–5.1)
Potassium: 3.6 mmol/L (ref 3.5–5.1)
Potassium: 3.6 mmol/L (ref 3.5–5.1)
Potassium: 3.8 mmol/L (ref 3.5–5.1)
Potassium: 3.7 mmol/L (ref 3.5–5.1)
Sodium: 131 mmol/L — ABNORMAL LOW (ref 135–145)
Sodium: 130 mmol/L — ABNORMAL LOW (ref 135–145)
Sodium: 132 mmol/L — ABNORMAL LOW (ref 135–145)
Sodium: 132 mmol/L — ABNORMAL LOW (ref 135–145)
Sodium: 133 mmol/L — ABNORMAL LOW (ref 135–145)
Sodium: 133 mmol/L — ABNORMAL LOW (ref 135–145)
Sodium: 134 mmol/L — ABNORMAL LOW (ref 135–145)
Sodium: 133 mmol/L — ABNORMAL LOW (ref 135–145)
TCO2: 39 mmol/L — ABNORMAL HIGH (ref 22–32)
TCO2: 33 mmol/L — ABNORMAL HIGH (ref 22–32)
TCO2: 32 mmol/L (ref 22–32)
TCO2: 31 mmol/L (ref 22–32)
TCO2: 33 mmol/L — ABNORMAL HIGH (ref 22–32)
TCO2: 28 mmol/L (ref 22–32)
TCO2: 30 mmol/L (ref 22–32)
TCO2: 30 mmol/L (ref 22–32)
pCO2 arterial: 42.3 mmHg (ref 32.0–48.0)
pCO2 arterial: 51.6 mmHg — ABNORMAL HIGH (ref 32.0–48.0)
pCO2 arterial: 48 mmHg (ref 32.0–48.0)
pCO2 arterial: 48.5 mmHg — ABNORMAL HIGH (ref 32.0–48.0)
pCO2 arterial: 49.4 mmHg — ABNORMAL HIGH (ref 32.0–48.0)
pCO2 arterial: 44 mmHg (ref 32.0–48.0)
pCO2 arterial: 48.5 mmHg — ABNORMAL HIGH (ref 32.0–48.0)
pCO2 arterial: 48.5 mmHg — ABNORMAL HIGH (ref 32.0–48.0)
pH, Arterial: 7.559 — ABNORMAL HIGH (ref 7.350–7.450)
pH, Arterial: 7.401 (ref 7.350–7.450)
pH, Arterial: 7.418 (ref 7.350–7.450)
pH, Arterial: 7.391 (ref 7.350–7.450)
pH, Arterial: 7.408 (ref 7.350–7.450)
pH, Arterial: 7.396 (ref 7.350–7.450)
pH, Arterial: 7.379 (ref 7.350–7.450)
pH, Arterial: 7.378 (ref 7.350–7.450)
pO2, Arterial: 80 mmHg — ABNORMAL LOW (ref 83.0–108.0)
pO2, Arterial: 67 mmHg — ABNORMAL LOW (ref 83.0–108.0)
pO2, Arterial: 78 mmHg — ABNORMAL LOW (ref 83.0–108.0)
pO2, Arterial: 61 mmHg — ABNORMAL LOW (ref 83.0–108.0)
pO2, Arterial: 166 mmHg — ABNORMAL HIGH (ref 83.0–108.0)
pO2, Arterial: 108 mmHg (ref 83.0–108.0)
pO2, Arterial: 111 mmHg — ABNORMAL HIGH (ref 83.0–108.0)
pO2, Arterial: 101 mmHg (ref 83.0–108.0)

## 2020-03-26 LAB — PROTIME-INR
INR: 1.9 — ABNORMAL HIGH (ref 0.8–1.2)
Prothrombin Time: 20.7 seconds — ABNORMAL HIGH (ref 11.4–15.2)

## 2020-03-26 LAB — TYPE AND SCREEN
ABO/RH(D): B POS
Antibody Screen: NEGATIVE
Unit division: 0
Unit division: 0
Unit division: 0
Unit division: 0

## 2020-03-26 LAB — APTT
aPTT: 66 seconds — ABNORMAL HIGH (ref 24–36)
aPTT: 64 seconds — ABNORMAL HIGH (ref 24–36)
aPTT: 76 seconds — ABNORMAL HIGH (ref 24–36)

## 2020-03-26 LAB — CBC
HCT: 25.3 % — ABNORMAL LOW (ref 39.0–52.0)
HCT: 23.7 % — ABNORMAL LOW (ref 39.0–52.0)
Hemoglobin: 8.2 g/dL — ABNORMAL LOW (ref 13.0–17.0)
Hemoglobin: 8.3 g/dL — ABNORMAL LOW (ref 13.0–17.0)
MCH: 30.1 pg (ref 26.0–34.0)
MCH: 32.2 pg (ref 26.0–34.0)
MCHC: 32.4 g/dL (ref 30.0–36.0)
MCHC: 35 g/dL (ref 30.0–36.0)
MCV: 93 fL (ref 80.0–100.0)
MCV: 91.9 fL (ref 80.0–100.0)
Platelets: 149 10*3/uL — ABNORMAL LOW (ref 150–400)
Platelets: 162 10*3/uL (ref 150–400)
RBC: 2.72 MIL/uL — ABNORMAL LOW (ref 4.22–5.81)
RBC: 2.58 MIL/uL — ABNORMAL LOW (ref 4.22–5.81)
RDW: 16.6 % — ABNORMAL HIGH (ref 11.5–15.5)
RDW: 16.5 % — ABNORMAL HIGH (ref 11.5–15.5)
WBC: 10.6 10*3/uL — ABNORMAL HIGH (ref 4.0–10.5)
WBC: 12.5 10*3/uL — ABNORMAL HIGH (ref 4.0–10.5)
nRBC: 0 % (ref 0.0–0.2)
nRBC: 0.2 % (ref 0.0–0.2)

## 2020-03-26 LAB — GLUCOSE, CAPILLARY
Glucose-Capillary: 125 mg/dL — ABNORMAL HIGH (ref 70–99)
Glucose-Capillary: 181 mg/dL — ABNORMAL HIGH (ref 70–99)
Glucose-Capillary: 158 mg/dL — ABNORMAL HIGH (ref 70–99)
Glucose-Capillary: 165 mg/dL — ABNORMAL HIGH (ref 70–99)
Glucose-Capillary: 151 mg/dL — ABNORMAL HIGH (ref 70–99)
Glucose-Capillary: 113 mg/dL — ABNORMAL HIGH (ref 70–99)

## 2020-03-26 LAB — LACTIC ACID, PLASMA
Lactic Acid, Venous: 1.3 mmol/L (ref 0.5–1.9)
Lactic Acid, Venous: 0.9 mmol/L (ref 0.5–1.9)

## 2020-03-26 LAB — BPAM RBC
Blood Product Expiration Date: 202201212359
Blood Product Expiration Date: 202201232359
Blood Product Expiration Date: 202201252359
Blood Product Expiration Date: 202201252359
Unit Type and Rh: 7300
Unit Type and Rh: 7300
Unit Type and Rh: 7300
Unit Type and Rh: 7300

## 2020-03-26 LAB — BASIC METABOLIC PANEL
Anion gap: 14 (ref 5–15)
Anion gap: 11 (ref 5–15)
BUN: 20 mg/dL (ref 6–20)
BUN: 23 mg/dL — ABNORMAL HIGH (ref 6–20)
CO2: 29 mmol/L (ref 22–32)
CO2: 26 mmol/L (ref 22–32)
Calcium: 9.3 mg/dL (ref 8.9–10.3)
Calcium: 8.9 mg/dL (ref 8.9–10.3)
Chloride: 88 mmol/L — ABNORMAL LOW (ref 98–111)
Chloride: 94 mmol/L — ABNORMAL LOW (ref 98–111)
Creatinine, Ser: 1.1 mg/dL (ref 0.61–1.24)
Creatinine, Ser: 0.97 mg/dL (ref 0.61–1.24)
GFR, Estimated: >60 mL/min (ref >60)
GFR, Estimated: >60 mL/min (ref >60)
Glucose, Bld: 173 mg/dL — ABNORMAL HIGH (ref 70–99)
Glucose, Bld: 148 mg/dL — ABNORMAL HIGH (ref 70–99)
Potassium: 3.5 mmol/L (ref 3.5–5.1)
Potassium: 3.9 mmol/L (ref 3.5–5.1)
Sodium: 131 mmol/L — ABNORMAL LOW (ref 135–145)
Sodium: 131 mmol/L — ABNORMAL LOW (ref 135–145)

## 2020-03-26 LAB — CULTURE, RESPIRATORY W GRAM STAIN: Culture: NORMAL

## 2020-03-26 LAB — HEPATIC FUNCTION PANEL
ALT: 35 U/L (ref 0–44)
AST: 51 U/L — ABNORMAL HIGH (ref 15–41)
Albumin: 4.2 g/dL (ref 3.5–5.0)
Alkaline Phosphatase: 110 U/L (ref 38–126)
Bilirubin, Direct: 0.4 mg/dL — ABNORMAL HIGH (ref 0.0–0.2)
Indirect Bilirubin: 2.7 mg/dL — ABNORMAL HIGH (ref 0.3–0.9)
Total Bilirubin: 3.1 mg/dL — ABNORMAL HIGH (ref 0.3–1.2)
Total Protein: 6.3 g/dL — ABNORMAL LOW (ref 6.5–8.1)

## 2020-03-26 LAB — FIBRINOGEN: Fibrinogen: 433 mg/dL (ref 210–475)

## 2020-03-26 MED ORDER — CLONAZEPAM 1 MG PO TABS
1.0000 mg | ORAL_TABLET | Freq: Four times a day (QID) | ORAL | Status: DC
  Administered 2020-03-26 – 2020-03-31 (×19): 1 mg
  Filled 2020-03-26 (×20): qty 1

## 2020-03-26 MED ORDER — LORAZEPAM 2 MG/ML IJ SOLN
1.0000 mg | INTRAMUSCULAR | Status: DC | PRN
  Administered 2020-03-26 – 2020-04-08 (×18): 1 mg via INTRAVENOUS
  Filled 2020-03-26 (×20): qty 1

## 2020-03-26 MED ORDER — BARIUM SULFATE 0.1 % PO SUSP: 200.0000 mL | Freq: Once | ORAL | Status: DC

## 2020-03-26 MED ORDER — NEOSTIGMINE METHYLSULFATE 10 MG/10ML IV SOLN
0.2500 mg | Freq: Four times a day (QID) | INTRAVENOUS | Status: DC
  Administered 2020-03-26 – 2020-03-27 (×3): 0.25 mg via SUBCUTANEOUS
  Filled 2020-03-26 (×7): qty 0.25

## 2020-03-26 MED ORDER — NEOSTIGMINE METHYLSULFATE 10 MG/10ML IV SOLN
0.2500 mg | Freq: Four times a day (QID) | INTRAVENOUS | Status: DC
  Filled 2020-03-26 (×2): qty 0.25

## 2020-03-26 MED ORDER — LIDOCAINE HCL (PF) 1 % IJ SOLN
5.0000 mL | Freq: Once | INTRAMUSCULAR | Status: AC
  Administered 2020-03-26: 5 mL

## 2020-03-26 MED ORDER — QUETIAPINE FUMARATE 100 MG PO TABS
100.0000 mg | ORAL_TABLET | Freq: Three times a day (TID) | ORAL | Status: DC
  Administered 2020-03-26 – 2020-03-30 (×13): 100 mg
  Filled 2020-03-26 (×14): qty 1

## 2020-03-26 MED ORDER — IOHEXOL 300 MG/ML  SOLN
50.0000 mL | Freq: Once | INTRAMUSCULAR | Status: AC | PRN
  Administered 2020-03-26: 50 mL

## 2020-03-26 MED ORDER — METOCLOPRAMIDE HCL 5 MG/ML IJ SOLN
10.0000 mg | Freq: Four times a day (QID) | INTRAMUSCULAR | Status: DC
  Administered 2020-03-26 – 2020-04-01 (×25): 10 mg via INTRAVENOUS
  Filled 2020-03-26 (×24): qty 2

## 2020-03-26 MED ORDER — POTASSIUM CHLORIDE 10 MEQ/50ML IV SOLN
10.0000 meq | INTRAVENOUS | Status: AC
  Administered 2020-03-26 (×6): 10 meq via INTRAVENOUS
  Filled 2020-03-26 (×5): qty 50

## 2020-03-26 MED ORDER — LIDOCAINE HCL 2 % IJ SOLN
5.0000 mL | Freq: Once | INTRAMUSCULAR | Status: DC
  Filled 2020-03-26: qty 10

## 2020-03-26 MED ORDER — POTASSIUM CHLORIDE 10 MEQ/50ML IV SOLN
INTRAVENOUS | Status: AC
  Filled 2020-03-26: qty 50

## 2020-03-26 NOTE — Progress Notes (Signed)
Physical Therapy Treatment Patient Details Name: Kerri Asche MRN: [id number] DOB: Dec 01, 1970 Today's Date: 03/26/2020    History of Present Illness 50 y.o. male  with no significant past medical history admitted on 03/08/2020 with dyspnea, cough, nausea/vomiting ~ 1 week ago with worsening symptoms of body aches and fatigue and +COVID 02/07/20 and admitted with shortness of breath. Required intubation 03/10/20. Cannulated for VV ECMO 02/24/2020. Oxygenating better with ECMO. Also evidence of RLE DVT, RV thrombus, and high suspicion of PE. Has required chest tube to L lung for collapse which needs further reposition with recurrent collapse. Extubated 03-15-19.  Reintubated 1/5 and trach placed 1/6.    PT Comments    Pt admitted with above diagnosis. Pt tolerated transfer to chair today via STedy.  Pt needing less assist to stand and appears to have improved endurance for activity as well. Will continue to progress pt as able. 0/4 goals met as pt with multiple medical issues. Revised goals today.   Pt currently with functional limitations due to the deficits listed below (see PT Problem List). Pt will benefit from skilled PT to increase their independence and safety with mobility to allow discharge to the venue listed below.     Follow Up Recommendations  CIR;Supervision/Assistance - 24 hour     Equipment Recommendations  Other (comment) (TBA)    Recommendations for Other Services       Precautions / Restrictions Precautions Precautions: Fall Precaution Comments: ECMO, Vent, chest tube, rectal tube, Foley Restrictions Weight Bearing Restrictions: No    Mobility  Bed Mobility Overal bed mobility: Needs Assistance Bed Mobility: Supine to Sit Rolling: Mod assist;+2 for physical assistance (extra 2 persons for ECMO and other lines) Sidelying to sit: Mod assist;Max assist;+2 for physical assistance (extra 2 persons) Supine to sit: Mod assist;+2 for physical assistance;Max assist;+2 for  safety/equipment   Sit to sidelying:  (extra 2 persons for lines) General bed mobility comments: modAx3 for helicopter transfer to EoB, pt needs help to scoot to EOB with mod assist for this  Transfers Overall transfer level: Needs assistance   Transfers: Sit to/from Stand Sit to Stand: +2 physical assistance;From elevated surface;Mod assist;+2 safety/equipment         General transfer comment: pt very eager to get out of bed and nods understanding to use of Stedy. pt is min to mod assist of 2 for initial sit>stand to [facility], vc for upright posture for power up, good ability to come to fully upright for pad placement, As pt fatigues, he can progress to mod assist for standing and mod max to sit on Stedy with care to watch lines.  Goal was to move pt to chair today therefore did not stand but 2 x.    Positioned pt in chair and son came in to visit.  Ambulation/Gait             General Gait Details: TBA   Stairs             Wheelchair Mobility    Modified Rankin (Stroke Patients Only)       Balance Overall balance assessment: Needs assistance Sitting-balance support: Feet supported;Bilateral upper extremity supported Sitting balance-Leahy Scale: Poor Sitting balance - Comments: pt able to sit in [place]  with min-max A for steadying, increased RR however maintains SaO2 on 50% FiO2 on vent with trach.  Needed mod to max to sit EOB as pt pushes posteriorly at times.   Standing balance support: Bilateral upper extremity supported;During functional activity Standing balance-Leahy  Scale: Poor Standing balance comment: relies on UE support for balance as well as 2 person mod to max assist                            Cognition Arousal/Alertness: Awake/alert Behavior During Therapy: Flat affect Overall Cognitive Status: Within Functional Limits for tasks assessed                                        Exercises      General Comments General  comments (skin integrity, edema, etc.): FiO2 50% and PEEP 5 with VSS      Pertinent Vitals/Pain Pain Assessment: Faces Faces Pain Scale: No hurt Pain Location: generalized Pain Descriptors / Indicators: Grimacing;Guarding;Discomfort Pain Intervention(s): Limited activity within patient's tolerance;Monitored during session;Repositioned    Home Living                      Prior Function            PT Goals (current goals can now be found in the care plan section) Acute Rehab PT Goals Patient Stated Goal: unable to state PT Goal Formulation: With patient Time For Goal Achievement: 04/09/20 Potential to Achieve Goals: Fair Progress towards PT goals: Progressing toward goals    Frequency    Min 3X/week      PT Plan Current plan remains appropriate    Co-evaluation PT/OT/SLP Co-Evaluation/Treatment: Yes Reason for Co-Treatment: For patient/therapist safety;Complexity of the patient's impairments (multi-system involvement) PT goals addressed during session: Mobility/safety with mobility OT goals addressed during session: Strengthening/ROM      AM-PAC PT "6 Clicks" Mobility   Outcome Measure  Help needed turning from your back to your side while in a flat bed without using bedrails?: A Lot Help needed moving from lying on your back to sitting on the side of a flat bed without using bedrails?: A Lot Help needed moving to and from a bed to a chair (including a wheelchair)?: A Lot Help needed standing up from a chair using your arms (e.g., wheelchair or bedside chair)?: A Lot Help needed to walk in hospital room?: Total Help needed climbing 3-5 steps with a railing? : Total 6 Click Score: 10    End of Session Equipment Utilized During Treatment: Other (comment) (vent with trach) Activity Tolerance: Patient limited by fatigue Patient left: with call bell/phone within reach;with nursing/sitter in room;in chair;with family/visitor present Nurse Communication:  Mobility status PT Visit Diagnosis: Muscle weakness (generalized) (M62.81);Pain Pain - part of body:  (chest)     Time: 4235-3614 PT Time Calculation (min) (ACUTE ONLY): 28 min  Charges:  $Therapeutic Activity: 8-22 mins                     Slayden Mennenga W,PT Acute Rehabilitation Services Pager:  [phone]  Office:  [place] 03/26/2020, 12:19 PM

## 2020-03-26 NOTE — Progress Notes (Signed)
Large emesis soon after 2200 meds was  Given. Emesis at about 2000  looked like tube feed  which was treated with zofran. Tube feeding stopped at 2310. Emesis preceded by coughing spell both times.

## 2020-03-26 NOTE — Progress Notes (Signed)
ANTICOAGULATION CONSULT NOTE - Follow Up Consult  Pharmacy Consult for bivalirudin Indication: ECMO and VTE  Labs: Recent Labs    03/24/20 0437 03/24/20 0844 03/25/20 0504 03/25/20 0514 03/25/20 1244 03/25/20 1350 03/25/20 1620 03/25/20 1659 03/26/20 0102 03/26/20 0103 03/26/20 0458 03/26/20 0459 03/26/20 1443 03/26/20 1700 03/26/20 1720  HGB 8.9*   < > 8.9*   < >  --    < > 9.1*   < >  --    < > 8.2*   < > 7.8* 8.3* 7.5*  HCT 26.2*   < > 25.9*   < >  --    < > 27.3*   < >  --    < > 25.3*   < > 23.0* 23.7* 22.0*  PLT 127*   < > 150  --   --   --  149*  --   --   --  149*  --   --  162  --   APTT 82*   < > 77*  --   --   --  85*  --  66*  --  64*  --   --  76*  --   LABPROT 21.4*  --  21.2*  --   --   --   --   --   --   --  20.7*  --   --   --   --   INR 1.9*  --  1.9*  --   --   --   --   --   --   --  1.9*  --   --   --   --   CREATININE 0.83   < > 0.99  --  0.98  --  0.89  --   --   --  1.10  --   --   --   --    < > = values in this interval not displayed.    Assessment: 89 yoM admitted with COVID-19 PNA with worsening hypoxia, s/p cannulation for ECMO. Pt was started on IV heparin prior to cannulation due to acute DVTs and possible PE,  transitioned to bivalirudin with ECMO. Now s/p tPA on 1/5 and tracheostomy on 1/6.   APTT therapeutic at 76 on current rate bivalirudin@ 0.19 mg/kg/hr.  Lab Hemoglobin 8.3 -stable. Platelets improving at 162. No overt bleeding noted per RN.    Goal of Therapy:  aPTT 60-80 seconds   Plan:  Continue bivalirudin at 0.19 mg/kg/hr Monitor CBC, 0500/1700 aPTT, LDH, and for s/sx of bleeding   Link Snuffer, PharmD, BCPS, BCCCP Clinical Pharmacist Please refer to [facility] for [facility] numbers 03/26/2020, 5:38 PM

## 2020-03-26 NOTE — Procedures (Signed)
Extracorporeal support note  ECLS cannulation date: 12/30 Last circuit change: n/a  Indication: Severe respiratory failure secondary to COVID-19 pneumonia with RV dysfunction  Configuration: Venovenous  Drainage cannula: 32 French crescent cannula via right IJ Return cannula: Same  Pump speed: 3500 RPM Pump flow: 4.8 L/min Pump used: Cardio help  Oxygenator: Cardio help O2 blender: 100% Sweep gas: 8L  Circuit check: clots at corners of oxygenator L>R, stable Anticoagulant: Bivalirudin Anticoagulation targets: PTT 60-80  Changes in support: none  Anticipated goals/duration of support: Bridge to recovery.  Multidisciplinary ECMO rounds completed.   Lorin Glass, MD 03/26/20 7:18 AM [facility]

## 2020-03-26 NOTE — Progress Notes (Signed)
RT note- Patient with continuous cough, cuff leak, air added, improved Vt's. Lidocaine nebulizer ordered and given with aerogen. Patient much more settled at this time with no coughing.

## 2020-03-26 NOTE — Progress Notes (Signed)
Nutrition Follow-up  DOCUMENTATION CODES:   Not applicable  INTERVENTION:   Tube Feeding via Cortrak (post pyloric):  Resume trickle TF of Pivot 1.5 at 20 ml/hr Goal rate: Pivot 1.5 to 65 ml/hr Recommend TF at rate of 20 ml/hr; titrate by 10 mL q 8 hours until goal rate of 65 Provides 2340 kcals, 146 g of protein and 1186 mL of free water Meets 100% estimated calorie and protein needs   NUTRITION DIAGNOSIS:   Increased nutrient needs related to acute illness,catabolic illness (COVID-19 infection) as evidenced by estimated needs.  Being addressed via TF  GOAL:   Patient will meet greater than or equal to 90% of their needs  Progressing  MONITOR:   Vent status,TF tolerance,Labs,Weight trends  REASON FOR ASSESSMENT:   LOS Enteral/tube feeding initiation and management  ASSESSMENT:   50 y.o. male with no significant medical history. He presented to the ED with dyspnea, cough, and N/V. He reported that his symptoms began with cough 1 week ago. He tried OTC meds but nothing helped. Symptoms worsened to include body aches, fatigue, and N/V 3 days later. He went to his PCP 12/26 and got tested for COVID; he was positive.   12/26 COVID+  12/28 Admitted to [facility] 12/29 Intubated 12/30 Transferred to [facility], VV ECMO cannulation, L PTX with Chest tube insertion 12/31 Cortrak placed, Post-pyloric  1/02 Extubated to HFNC/BiPap as needed, ECHO with EF 60-65% 1/06 TEE for ECMO cannula position, Re-Intubated, Rhinorockets placed for epistaxis, Cortrak malpositioned-repositioned and now gastric per xray 1/07 Trach placed  Pt working with PT today; standing and up to chair.  Remains on VV EMCO and vent support via trach  Trickle TF restarted last night. Pt with emesis overnight after coughing episodes. Abd xray this AM negative for obstruction or air but Cortrak coiled in stomach with tip pointing upwards. Cortrak being repositioned today, goal is post pyloric position with plans to resume  trickle TF  Noted reglan dose increased today; neostigmine q 6 hours initiated  Labs: reviewed Meds: miralax, senokot, prednisone  Diet Order:   Diet Order            Diet NPO time specified  Diet effective midnight                 EDUCATION NEEDS:   Not appropriate for education at this time  Skin:  Skin Assessment: Skin Integrity Issues: Skin Integrity Issues:: DTI DTI: face-forehead  Last BM:  1/13 rectal tube  Height:   Ht Readings from Last 1 Encounters:  04-07-2020 5\' 4"  (1.626 m)    Weight:   Wt Readings from Last 1 Encounters:  03/26/20 73.5 kg    Ideal Body Weight:  59.1 kg  BMI:  Body mass index is 27.81 kg/m.  Estimated Nutritional Needs:   Kcal:  2160-2520 kcals  Protein:  130-150 g  Fluid:  >/= 2 L   05-30-1992 MS, RDN, LDN, CNSC Registered Dietitian III Clinical Nutrition RD Pager and On-Call Pager Number Located in [place]

## 2020-03-26 NOTE — Progress Notes (Signed)
Occupational Therapy Treatment Patient Details Name: Alex [name] MRN: [id number] DOB: [birth year] Today's Date: 03/26/2020    History of present illness 50 y.o. male  with no significant past medical history admitted on 03/07/2020 with dyspnea, cough, nausea/vomiting ~ 1 week ago with worsening symptoms of body aches and fatigue and +COVID 02/07/20 and admitted with shortness of breath. Required intubation 03/10/20. Cannulated for VV ECMO 14-Mar-2020. Oxygenating better with ECMO. Also evidence of RLE DVT, RV thrombus, and high suspicion of PE. Has required chest tube to L lung for collapse which needs further reposition with recurrent collapse. Extubated 03-15-19.  Reintubated 1/5 and trach placed 1/6.   OT comments  Patient continues to be motivated and willing to participate despite current medical status.  Patient with ECMO specialist, RN, and PT able to sit edge of bed for 3 to 4 minutes.  Stood in steady lift times two, and was transferred to the recliner.  Lift pad for overhead lift was placed for eventual return to the bed.  It is hoped as he is tolerating out of bed and in the recliner more, OT can begin to incorporate functional tasks into his routine.  Acute OT to continue in order to maximize functional abilities for a transition to CIR in the near future.     Follow Up Recommendations  CIR    Equipment Recommendations  None recommended by OT    Recommendations for Other Services      Precautions / Restrictions Precautions Precautions: Fall Precaution Comments: ECMO, Vent, chest tube, rectal tube, Foley Restrictions Weight Bearing Restrictions: No       Mobility Bed Mobility Overal bed mobility: Needs Assistance Bed Mobility: Supine to Sit Rolling: (P) Mod assist;+2 for physical assistance (extra 2 persons for ECMO and other lines) Sidelying to sit: (P) Mod assist;Max assist;+2 for physical assistance (extra 2 persons) Supine to sit: Mod assist;+2 for physical assistance;Max  assist;+2 for safety/equipment   Sit to sidelying: (P)  (extra 2 persons for lines) General bed mobility comments: (P) modAx3 for helicopter transfer to EoB, pt needs help to scoot to EOB with mod assist for this  Transfers Overall transfer level: Needs assistance   Transfers: Sit to/from Stand Sit to Stand: +2 physical assistance;From elevated surface;Mod assist;+2 safety/equipment         General transfer comment: (P) pt very eager to get out of bed and nods understanding to use of Stedy. pt is maxAx2 for initial sit>stand to [facility], vc for upright posture for power up, good ability to come to fully upright for pad placement, pt performed multiple stands in [place] and then began to fatigue.  Stood 10 seconds x 2, 20 seconds, 30 seconds and then 45 seconds.  Pt requires total A once he fatigues.  +3 assist to return pt to bed safely.    Balance Overall balance assessment: Needs assistance Sitting-balance support: Feet supported;Bilateral upper extremity supported Sitting balance-Leahy Scale: Poor Sitting balance - Comments: (P) pt able to sit in [place] off and on for 5 minutes with min-max A for steadying, increased RR however maintains SaO2 on 90%FiO2 on vent with trach.  Needed mod to max to sit EOB as pt pushes posteriorly at times.   Standing balance support: Bilateral upper extremity supported;During functional activity Standing balance-Leahy Scale: Poor Standing balance comment: (P) relies on UE support for balance as well as 2 person mod to max assist  Cognition Arousal/Alertness: (P) Awake/alert Behavior During Therapy: (P) Flat affect Overall Cognitive Status: (P) Within Functional Limits for tasks assessed                                          Exercises Exercises: (P) General Upper Extremity;General Lower Extremity General Exercises - Upper Extremity Shoulder Flexion: (P) PROM;Both;5 reps;Supine Elbow Flexion: (P)  PROM;Both;5 reps;Supine Elbow Extension: (P) PROM;Both;5 reps;Supine General Exercises - Lower Extremity Ankle Circles/Pumps: (P) PROM;Both;5 reps;Supine Long Arc Quad: (P) AROM;Both;5 reps;Seated Heel Slides: (P) PROM;Both;10 reps;Supine Hip Flexion/Marching: (P) PROM;Both;5 reps;Supine   Shoulder Instructions       General Comments      Pertinent Vitals/ Pain       Pain Assessment: (P) Faces Faces Pain Scale: (P) Hurts little more Pain Location: (P) generalized Pain Descriptors / Indicators: (P) Grimacing;Guarding;Discomfort Pain Intervention(s): (P) Limited activity within patient's tolerance;Monitored during session;Repositioned                                                          Frequency  Min 2X/week        Progress Toward Goals  OT Goals(current goals can now be found in the care plan section)  Progress towards OT goals: Progressing toward goals  Acute Rehab OT Goals Patient Stated Goal: unable to state OT Goal Formulation: Patient unable to participate in goal setting Time For Goal Achievement: 04/02/20 Potential to Achieve Goals: Good  Plan Discharge plan remains appropriate    Co-evaluation      Reason for Co-Treatment: For patient/therapist safety;Complexity of the patient's impairments (multi-system involvement) PT goals addressed during session: (P) Mobility/safety with mobility OT goals addressed during session: Strengthening/ROM      AM-PAC OT "6 Clicks" Daily Activity     Outcome Measure   Help from another person eating meals?: Total Help from another person taking care of personal grooming?: A Lot Help from another person toileting, which includes using toliet, bedpan, or urinal?: Total Help from another person bathing (including washing, rinsing, drying)?: A Lot Help from another person to put on and taking off regular upper body clothing?: A Lot Help from another person to put on and taking off regular lower  body clothing?: Total 6 Click Score: 9    End of Session    OT Visit Diagnosis: Muscle weakness (generalized) (M62.81)   Activity Tolerance Patient tolerated treatment well   Patient Left in chair;with call bell/phone within reach;with nursing/sitter in room   Nurse Communication Need for lift equipment        Time: 1025-1100 OT Time Calculation (min): 35 min  Charges: OT General Charges $OT Visit: 1 Visit OT Treatments $Therapeutic Activity: 8-22 mins  03/26/2020  Rich, OTR/L  Acute Rehabilitation Services  Office:  [phone]    Suzanna Obey 03/26/2020, 12:02 PM

## 2020-03-26 NOTE — Progress Notes (Signed)
ECMO PROGRESS NOTE  NAME:  Alex [name], MRN:  [id number], DOB:  1971/01/07, LOS: 17 ADMISSION DATE:  02/18/2020, CONSULTATION DATE: 16-Mar-2020 REFERRING MD: Wynona Neat -LBPCCM, CHIEF COMPLAINT: Respiratory failure requiring ECMO  HPI/course in hospital  50 year old man admitted to hospital 12/28 with 1 week history of dyspnea cough nausea and vomiting.  Initially admitted to [facility] long hospital and placed on high flow nasal cannula but rapidly failed and required intubation 12/29.  Persistent hypoxic respiratory failure with PF ratio 55 in spite of 18 of PEEP FiO2 0.1 despite paralytics.  Did not improve with prone ventilation  Cannulated for VV ECMO 12/30 via right IJ crescent cannula. Iatrogenic pneumothorax from left subclavian triple-lumen placement  Family confirms no significant past medical history apart from possible prediabetes  Past Medical History  none  Interim history/subjective:  1/13: no great issues overnight. Stopping lasix infusion and dosing daily. Will give dose of acetazolamide. Fever down and normothermic now. precedex off and this has greatly helped his mentation. Also started having bm 1/12: awake communicative just stood multiple times and so is tachypneic and fatigues. Febrile this am to 99.1, turn down heater temp for now, pan cx but pt has been on broad abx with vanc just stopping Monday and cefepime ongoing. Will aos attempt to stop precedex as can contribute and if able to settle out will wean sweep and see. Circuit will likely need change in next few days if unable to come off.  1/11: awake and able to communicate. Able to wean to 2 sweep. Still with good diuresis. Able to speak with in line speaking valve.  1/10: still on escalated sedation. Will increase po to wean continuous, wbc normal. Lactate normal. Will stop vanc (neg pcr)  Objective   Blood pressure 99/61, pulse (!) 110, temperature 98.4 F (36.9 C), resp. rate (!) 0, height 5\' 4"  (1.626 m),  weight 73.5 kg, SpO2 100 %.    Vent Mode: PCV FiO2 (%):  [50 %] 50 % Set Rate:  [10 bmp] 10 bmp PEEP:  [10 cmH20] 10 cmH20 Plateau Pressure:  [18 cmH20-19 cmH20] 18 cmH20   Intake/Output Summary (Last 24 hours) at 03/26/2020 0720 Last data filed at 03/26/2020 03/28/2020 Gross per 24 hour  Intake 2223.43 ml  Output 6760 ml  Net -4536.57 ml   Filed Weights   03/24/20 0418 03/25/20 0700 03/26/20 0500  Weight: 80.7 kg 77 kg 73.5 kg    ECMO Device: Cardiohelp  ECMO Mode: VV  Flow (LPM): 4.82   Examination: Constitutional: no acute distress lying in bed  Eyes: EOMI, pupils equal Ears, nose, mouth, and throat: trach in place, some small clots around stoma, no active bleeding Cardiovascular: tachycardic, ext warm under warming blanker Respiratory: severely diminished bilaterally, triggering vent Gastrointestinal: hypoactive BS, rectal tube in place Skin: L fingers with ischemic changes, no rashes Neurologic: moves all 4 ext to command, weak Psychiatric: RASS -1, nodding to questions appropriately Chest tube with serosanguinous output  Net -4.5L yesterday, -4.3L for admission CXR a bit more clear, chest tube in place Cr slightly up Liver function stable Lactic acid benign LDH 1100>>911 Plts 149>>149 Hgb 9.1>>8.2 WBC 14>>10 No fevers Off abx Sugars 125>>181   Assessment & Plan:  Acute hypoxemic and hypercarbic respiratory failure secondary to severe COVID ARDS, likely PE, and RV dysfunction s/p VV ECMO. Need for sedation with mechanical ventilation - Continue VV support. Monitor usual parameters - Daily CXR - Keep on drier side - Ultraprotective lung ventilation, not  ready to start liberalizing this - Finish steroid course - Bival targeting ptt 60-80 - Wean sweep today - Diuretic holiday - Continue PO klonipin (reduced today), seroquel (reduced today), oxycodone, clonidine, PRN versed, PRN fentanyl  Intermittent hypertension and likely flash pulmonary edema- seems related  to sedation, need to be careful with sweep and sedation changes - Continue metoprolol - Maintain euvolemia  Gastroparesis with some element ileus- really would benefit from postpyloric tube placement, will ask RD for help with this today - Trickle feeds once TF confirmed postpyloric - Increase reglan - Start subcutaneous neostigmine, watch secretion burden  Ischemic changes L hand- due to pressors and LUE DVT impeding blood flow, stable, monitor, L radial line as been removed  DM2 with hyperglycemia- SSI for now while   Muscular deconditioning- appreciate PT help  Best practice:  Diet: resume after postpyloric TF Pain/Anxiety/Delirium protocol (if indicated): see above VAP protocol (if indicated): in place DVT prophylaxis: bival gtt GI prophylaxis: PPI Glucose control: SSI Mobility: up with PT as tolerated Code Status: full Family Communication: per ecmo team Disposition: ICU   Patient critically ill due to COVID ARDS Interventions to address this today vent titration, sedation titration, ECMO titration Risk of deterioration without these interventions is high  I personally spent 45 minutes providing critical care not including any separately billable procedures  Myrla Halsted MD [facility] 03/26/2020 8:21 AM Personal pager: [phone] If unanswered, please page CCM On-call: #[phone]

## 2020-03-26 NOTE — Progress Notes (Signed)
ANTICOAGULATION CONSULT NOTE - Follow Up Consult  Pharmacy Consult for bivalirudin Indication: ECMO and VTE  Labs: Recent Labs    03/23/20 0424 03/23/20 0801 03/24/20 0437 03/24/20 0844 03/24/20 1722 03/24/20 1724 03/25/20 0504 03/25/20 0514 03/25/20 1244 03/25/20 1350 03/25/20 1620 03/25/20 1659 03/25/20 1833 03/25/20 1958 03/26/20 0102 03/26/20 0103  HGB 9.2*   < > 8.9*   < > 9.0*   < > 8.9*   < >  --    < > 9.1*   < > 8.8* 8.5*  --  7.5*  HCT 28.3*   < > 26.2*   < > 26.3*   < > 25.9*   < >  --    < > 27.3*   < > 26.0* 25.0*  --  22.0*  PLT 126*   < > 127*  --  126*  --  150  --   --   --  149*  --   --   --   --   --   APTT 68*   < > 82*  --  90*   < > 77*  --   --   --  85*  --   --   --  66*  --   LABPROT 20.0*  --  21.4*  --   --   --  21.2*  --   --   --   --   --   --   --   --   --   INR 1.8*  --  1.9*  --   --   --  1.9*  --   --   --   --   --   --   --   --   --   CREATININE 0.76   < > 0.83  --  0.96  --  0.99  --  0.98  --  0.89  --   --   --   --   --    < > = values in this interval not displayed.    Assessment: 68 yoM admitted with COVID-19 PNA with worsening hypoxia, s/p cannulation for ECMO. Pt was started on IV heparin prior to cannulation due to acute DVTs and possible PE,  transitioned to bivalirudin with ECMO. Now s/p tPA on 1/5 and tracheostomy on 1/6.   APTT came back at 85, slightly supratherapeutic, on bivalirudin@ 0.22 mg/kg/hr. No change in flank bruising by CT - unchanged and no increase in CT drainage, Hgb 9.1- stable. Circuit stable - no issues.   1/14 AM update: APTT therapeutic   Goal of Therapy:  aPTT 60-80 seconds   Plan:  Cont bivalirudin at 0.19 mg/kg/hr Monitor CBC, 0500/1700 aPTT, LDH, and for s/sx of bleeding   Abran Duke, PharmD, BCPS Clinical Pharmacist Phone: [phone]

## 2020-03-26 NOTE — Progress Notes (Signed)
Patient ID: Alex [name], male   DOB: September 21, 1970, 50 y.o.   MRN: [id number]    Advanced Heart Failure Rounding Note  PCP-Cardiologist: No primary care provider on file.   Subjective:    - 12/30: VV ECMO cannulation - 12/31: Left chest tube replaced - 1/2: Extubated. Echo with EF 60-65%, mildly dilated RV with mildly decreased systolic function.  - 1/4: Agitated, suspected aspiration.  Re-intubated.  - 1/5: ECMO cannula repositioned under TEE guidance. TEE showed moderately dilated/moderate-severely dysfunctional RV in setting of hypoxemia. LUE DVT found.  Patient got 1/2 dose of TPA due to initial concern for large PE.  - 1/6: Tracheostomy - 1/7: Echo with mild RV dilation/mild RV dysfunction.   Trickle feeds were restarted last night. Had lots of vomitting last night. TFs stopped.   Given diamox overnight. Bicarb improved. Precedex stopped.   CXR improved today Personally reviewed  More alert and less agitated today> RR in 20s  ECMO parameters: 3500 rpm Flow 4.9 L/min Pvenous -84 Delta P 27 Sweep 2 -> 11 -> 8   ABG 7.40/52/67/92% LDH 410 => 543 => 728 => 1041 => 998 => 1305 => 934 => 959 => 1009 => 893 => 901 => 1078 => 1,170 => 1,117 => 911  Lactate 1.3  On vent 50% TV 300-400  Objective:   Weight Range: 73.5 kg Body mass index is 27.81 kg/m.   Vital Signs:   Temp:  [98.3 F (36.8 C)-98.4 F (36.9 C)] 98.4 F (36.9 C) (01/14 0000) Pulse Rate:  [101-127] 101 (01/14 0800) Resp:  [0-41] 0 (01/14 0800) BP: (99-158)/(61-91) 99/61 (01/14 0700) SpO2:  [91 %-100 %] 100 % (01/14 0800) Arterial Line BP: (108-232)/(51-84) 142/65 (01/14 0800) FiO2 (%):  [50 %] 50 % (01/14 0800) Weight:  [73.5 kg] 73.5 kg (01/14 0500) Last BM Date: 03/24/20  Weight change: Filed Weights   03/24/20 0418 03/25/20 0700 03/26/20 0500  Weight: 80.7 kg 77 kg 73.5 kg    Intake/Output:   Intake/Output Summary (Last 24 hours) at 03/26/2020 0810 Last data filed at 03/26/2020 0800 Gross  per 24 hour  Intake 2398.63 ml  Output 6760 ml  Net -4361.37 ml      Physical Exam    General:  Awake on trach. Interactive HEENT: normal + cor-trak Neck: supple. RIJ ecmo cannula + trach Carotids 2+ bilat; no bruits. No lymphadenopathy or thryomegaly appreciated. Cor: PMI nondisplaced. Regular rate & rhythm. No rubs, gallops or murmurs. Lungs: coarse Abdomen: soft, nontender, nondistended. No hepatosplenomegaly. No bruits or masses. Good bowel sounds. Extremities: no cyanosis, clubbing, rash, edema Neuro: alert & orientedx3, cranial nerves grossly intact. moves all 4 extremities w/o difficulty. Affect pleasant   Telemetry   NSR ~100 Personally reviewed  Labs    CBC Recent Labs    03/25/20 1620 03/25/20 1659 03/26/20 0458 03/26/20 0459  WBC 13.7*  --  10.6*  --   HGB 9.1*   < > 8.2* 8.5*  HCT 27.3*   < > 25.3* 25.0*  MCV 91.0  --  93.0  --   PLT 149*  --  149*  --    < > = values in this interval not displayed.   Basic Metabolic Panel Recent Labs    03/25/20 1620 03/25/20 1659 03/26/20 0458 03/26/20 0459  NA 131*   < > 131* 130*  K 4.1   < > 3.5 3.5  CL 81*  --  88*  --   CO2 39*  --  29  --  GLUCOSE 122*  --  173*  --   BUN 19  --  20  --   CREATININE 0.89  --  1.10  --   CALCIUM 8.6*  --  9.3  --    < > = values in this interval not displayed.   Liver Function Tests Recent Labs    03/25/20 0504 03/26/20 0458  AST 55* 51*  ALT 43 35  ALKPHOS 149* 110  BILITOT 1.7* 3.1*  PROT 6.0* 6.3*  ALBUMIN 3.5 4.2   No results for input(s): LIPASE, AMYLASE in the last 72 hours. Cardiac Enzymes No results for input(s): CKTOTAL, CKMB, CKMBINDEX, TROPONINI in the last 72 hours.  BNP: BNP (last 3 results) No results for input(s): BNP in the last 8760 hours.  ProBNP (last 3 results) No results for input(s): PROBNP in the last 8760 hours.   D-Dimer No results for input(s): DDIMER in the last 72 hours. Hemoglobin A1C No results for input(s): HGBA1C in  the last 72 hours. Fasting Lipid Panel No results for input(s): CHOL, HDL, LDLCALC, TRIG, CHOLHDL, LDLDIRECT in the last 72 hours. Thyroid Function Tests No results for input(s): TSH, T4TOTAL, T3FREE, THYROIDAB in the last 72 hours.  Invalid input(s): FREET3  Other results:   Imaging    DG CHEST PORT 1 VIEW  Result Date: 03/25/2020 CLINICAL DATA:  50 year old male with positive COVID-19. ECMO. EXAM: PORTABLE CHEST 1 VIEW COMPARISON:  Earlier radiograph dated 03/25/2020. FINDINGS: The tip of the left IJ central venous line has flipped up extending into the upper SVC in the region of the junction of the right IJ and subclavian vein. The remainder of the support apparatus appear in similar positioning. Slight interval worsening of diffuse bilateral airspace opacities. Small to moderate right pleural effusion. No pneumothorax. Stable cardiomediastinal silhouette. No acute osseous pathology. IMPRESSION: 1. The tip of the left IJ central venous line has flipped up and now located in the upper SVC in the region of the junction of the right IJ and subclavian vein. 2. Worsened bilateral pulmonary opacities. Electronically Signed   By: Anner Crete M.D.   On: 03/25/2020 18:38     Medications:     Scheduled Medications: . sodium chloride   Intravenous Once  . acetaZOLAMIDE  250 mg Per Tube BID  . chlorhexidine gluconate (MEDLINE KIT)  15 mL Mouth Rinse BID  . Chlorhexidine Gluconate Cloth  6 each Topical Daily  . clonazePAM  1 mg Per Tube Q6H  . cloNIDine  0.2 mg Per Tube Q8H  . docusate  100 mg Per Tube BID  . furosemide  40 mg Intravenous Daily  . guaiFENesin-dextromethorphan  10 mL Per Tube BID  . insulin aspart  0-15 Units Subcutaneous Q4H  . living well with diabetes book   Does not apply Once  . mouth rinse  15 mL Mouth Rinse 10 times per day  . metoCLOPramide (REGLAN) injection  10 mg Intravenous Q6H  . metoprolol tartrate  25 mg Per Tube BID  . neostigmine  0.25 mg Subcutaneous  Q6H  . oxyCODONE  20 mg Per Tube Q6H  . pantoprazole sodium  40 mg Per Tube QHS  . polyethylene glycol  17 g Per Tube BID  . predniSONE  20 mg Per Tube Q breakfast   Followed by  . [START ON 03/29/2020] predniSONE  10 mg Per Tube Q breakfast  . QUEtiapine  100 mg Per Tube TID  . sennosides  10 mL Per Tube BID  . sodium  chloride flush  10-40 mL Intracatheter Q12H    Infusions: . sodium chloride    . sodium chloride Stopped (03/23/20 0951)  . sodium chloride 10 mL/hr at 03/26/20 0800  . sodium chloride Stopped (03/12/20 0131)  . albumin human Stopped (03/25/20 2011)  . albumin human 12.5 g (03/25/20 0231)  . bivalirudin (ANGIOMAX) infusion 0.5 mg/mL (Non-ACS indications) 0.19 mg/kg/hr (03/26/20 0800)  . dextrose Stopped (03/25/20 1444)  . feeding supplement (PIVOT 1.5 CAL) Stopped (03/25/20 2305)  . fentaNYL infusion INTRAVENOUS Stopped (03/25/20 1549)  . potassium chloride 50 mL/hr at 03/26/20 0800  . potassium chloride      PRN Medications: Place/Maintain arterial line **AND** sodium chloride, sodium chloride, sodium chloride, acetaminophen (TYLENOL) oral liquid 160 mg/5 mL, albumin human, albumin human, chlorpheniramine-HYDROcodone, dextrose, fentaNYL (SUBLIMAZE) injection, guaiFENesin, haloperidol lactate, hydrALAZINE, labetalol, lip balm, LORazepam, ondansetron (ZOFRAN) IV, oxyCODONE, sodium chloride flush   Assessment/Plan   1. Acute hypoxemic respiratory failure: Due to COVID-19 PNA with bilateral infiltrates.  Refractory hypoxemia, VV-ECMO cannulation on 02/23/2020 with improvement in oxygenation.  Developed left PTX post-subclavian CVL and has left chest tube, the left lung is re-expanded.  He was extubated 1/2 but reintubated 1/4 with agitation and suspected aspiration.  Tracheostomy 1/6.  CXR with bilateral infiltrates, improved today.  He is now on cefepime for empiric abx coverage.  ECMO cannula repositioned 1/5.  LDH coming down today - Patient has had remdesivir,  tocilizumab. - Ongoing steroids with prednisone.  - ECMO circuit functioning appropriately despite high LDH. Several clots in oxygenator. Post oxygenator gas ok on 1/12. LDH coming down today - Continue bivalirudin, goal PTT 60-80. He is at 61 today Discussed dosing with PharmD personally. - Off cefepime 1(stopped 1/12) -> Afebrile.  Recultured 1/12. NGTD. Follow closely.  - Failed sweep trial 1/12. Sweep demands then rose rapidly to 11now weaning back down to 8. CXR is better. Continue to wean sweep - Volume status improved. Was alkalotic with diuresis. Given diamox 1/13. Now improved. Hold diuretics today - off precedex. Less agitated today 2. RLE DVT/LUE DVT/thrombus in RV/suspect PE: Echo with moderately dilated and moderately dysfunctional RV.  Clot noted on TEE in RV as well.  TTE 1/2 showed normal EF 60-65%, RV improved (mildly dilated/dysfunctional). TEE on 1/5 with moderate to severe RV dysfunction but patient was hypoxemic.  Had 1/2 dose TPA on 1/5. Echo 1/7 with mildly dilated/mildly dysfunctional RV.  - Bivalirudin for goal PTT 60-80.  Management as above 3. Left PTX: Left chest tube, lung is re-expanded.   4. Shock: Suspect septic/distributive.  Now resolved, off NE.  5. Anemia: hgb 8.2 transfuse < 8.   6. AKI: Resolved.  7. Hyperglycemia: insulin.  8. HTN: Following cuff pressure (whip in ABG). BP remains very labile but now improved some. Continue to adjust meds as needed 9. CHB: Episode of CHB when hypoxemic.  NSR since then.  - With elevated HR, will continue  metoprolol 25 mg bid but follow closely.  10. Thrombocytopenia: Mild, stable. 11. Ileus: TFs restarted overnight but had n/v - Getting Reglan and neostigmine.  - Continue Relastor and sorbitol - Cor-trac team contacted this am to get tip of tube post-pyloric again 12. Ischemic digits - follow   CRITICAL CARE Performed by: Glori Bickers  Total critical care time: 35  minutes  Critical care time was exclusive  of separately billable procedures and treating other patients.  Critical care was necessary to treat or prevent imminent or life-threatening deterioration.  Critical care was time  spent personally by me on the following activities: development of treatment plan with patient and/or surrogate as well as nursing, discussions with consultants, evaluation of patient's response to treatment, examination of patient, obtaining history from patient or surrogate, ordering and performing treatments and interventions, ordering and review of laboratory studies, ordering and review of radiographic studies, pulse oximetry and re-evaluation of patient's condition.    Length of Stay: [place], MD  03/26/2020, 8:10 AM  Advanced Heart Failure Team Pager [phone] (M-F; [place])  Please contact [facility] for night-coverage after hours (4p -7a ) and weekends on [email or web address]

## 2020-03-26 NOTE — Progress Notes (Signed)
  Speech Language Pathology Treatment: [name] Speaking valve  Patient Details Name: Alex [name] MRN: [id number] DOB: 06/17/1970 Today's Date: 03/26/2020 Time: 1100-1130 SLP Time Calculation (min) (ACUTE ONLY): 30 min  Assessment / Plan / Recommendation Clinical Impression  Excellent session with inline PMSV today. Pt seen up in chair after PT session and voice significantly clearer than any other session, likely due to upright posture and better neck positioning for airflow to upper airway. Vent settings and changes all the same except RT increased FiO2 to 100% mid session given O2 sats around 90%. Pt able to verbalize with about 70% intelligibility at phrase level. Occasionally provided exercises in slower deeper breathing to facilitate increased volume and breath support. As the session progressed pt became a little more breathy as his endurance faded. Pt able to express wants and needs to caregivers, explain changes to his visual field (pt reports best vision at 10-12 o-clock about 5-7 feet distance from him. Pt again struggled with use of communication board; vision impaired up close, pt could only point to 50% of cued items though confirmation methods were reinforced. Also addressed methods to call for help to staff including the video ICU RN and make noise with his hand on the bed. Will continue efforts.   HPI HPI: 50 year old man admitted to hospital 12/28 with 1 week history of dyspnea cough nausea and vomiting.  Initially admitted to [facility] and placed on high flow nasal cannula but rapidly failed and required intubation 12/29.  Persistent hypoxic respiratory failure with PF ratio 55 in spite of 18 of PEEP FiO2 0.1 despite paralytics.  Did not improve with prone ventilation. Cannulated for VV ECMO 12/30 via right IJ crescent cannula. Iatrogenic pneumothorax from left subclavian triple-lumen placement. Trach on 1/7.      SLP Plan  Continue with current plan of care        Recommendations         Patient may use Passy-Muir Speech Valve: with SLP only         General recommendations: Rehab consult Oral Care Recommendations: Oral care BID Follow up Recommendations: Inpatient Rehab SLP Visit Diagnosis: Aphonia (R49.1) Plan: Continue with current plan of care       GO                [name], Riley Nearing 03/26/2020, 12:24 PM

## 2020-03-26 NOTE — Progress Notes (Signed)
RT at bedside for in line PMV trial. Cuff was deflated and the peep was placed on 3 during trial. Pt tolerated well. Pt's cuff was inflated and placed on previous vent settings after PMV trial was completed. Pt is stable at this time. RT will monitor.

## 2020-03-26 NOTE — Progress Notes (Signed)
Brief Cortrak Note  Received page from RN that Cortrak tube is in patient stomach after being displaced during TEE and pt has been vomiting. RN asked Cortrak RD to advance tube post pyloric. RD re-advanced Cortrak and bridled at 90 cm. Abd xray ordered to confirm placement prior to use. Discussed with RN.  Eugene Gavia, MS, RD, LDN RD pager number and weekend/on-call pager number located in [place].

## 2020-03-27 ENCOUNTER — Inpatient Hospital Stay (HOSPITAL_COMMUNITY): Payer: Medicaid Other

## 2020-03-27 LAB — POCT I-STAT 7, (LYTES, BLD GAS, ICA,H+H)
Acid-Base Excess: 3 mmol/L — ABNORMAL HIGH (ref 0.0–2.0)
Acid-Base Excess: 0 mmol/L (ref 0.0–2.0)
Acid-Base Excess: 2 mmol/L (ref 0.0–2.0)
Acid-Base Excess: 0 mmol/L (ref 0.0–2.0)
Acid-Base Excess: 4 mmol/L — ABNORMAL HIGH (ref 0.0–2.0)
Acid-base deficit: 1 mmol/L (ref 0.0–2.0)
Bicarbonate: 28.3 mmol/L — ABNORMAL HIGH (ref 20.0–28.0)
Bicarbonate: 24.6 mmol/L (ref 20.0–28.0)
Bicarbonate: 25.7 mmol/L (ref 20.0–28.0)
Bicarbonate: 28.9 mmol/L — ABNORMAL HIGH (ref 20.0–28.0)
Bicarbonate: 27.1 mmol/L (ref 20.0–28.0)
Bicarbonate: 29.9 mmol/L — ABNORMAL HIGH (ref 20.0–28.0)
Calcium, Ion: 1.33 mmol/L (ref 1.15–1.40)
Calcium, Ion: 1.33 mmol/L (ref 1.15–1.40)
Calcium, Ion: 1.3 mmol/L (ref 1.15–1.40)
Calcium, Ion: 1.28 mmol/L (ref 1.15–1.40)
Calcium, Ion: 1.26 mmol/L (ref 1.15–1.40)
Calcium, Ion: 1.29 mmol/L (ref 1.15–1.40)
HCT: 23 % — ABNORMAL LOW (ref 39.0–52.0)
HCT: 26 % — ABNORMAL LOW (ref 39.0–52.0)
HCT: 28 % — ABNORMAL LOW (ref 39.0–52.0)
HCT: 30 % — ABNORMAL LOW (ref 39.0–52.0)
HCT: 28 % — ABNORMAL LOW (ref 39.0–52.0)
HCT: 26 % — ABNORMAL LOW (ref 39.0–52.0)
Hemoglobin: 7.8 g/dL — ABNORMAL LOW (ref 13.0–17.0)
Hemoglobin: 8.8 g/dL — ABNORMAL LOW (ref 13.0–17.0)
Hemoglobin: 9.5 g/dL — ABNORMAL LOW (ref 13.0–17.0)
Hemoglobin: 10.2 g/dL — ABNORMAL LOW (ref 13.0–17.0)
Hemoglobin: 9.5 g/dL — ABNORMAL LOW (ref 13.0–17.0)
Hemoglobin: 8.8 g/dL — ABNORMAL LOW (ref 13.0–17.0)
O2 Saturation: 97 %
O2 Saturation: 96 %
O2 Saturation: 97 %
O2 Saturation: 96 %
O2 Saturation: 95 %
O2 Saturation: 95 %
Patient temperature: 37.4
Patient temperature: 37.5
Patient temperature: 37.9
Patient temperature: 37.5
Patient temperature: 36.9
Patient temperature: 36.6
Potassium: 3.5 mmol/L (ref 3.5–5.1)
Potassium: 4.2 mmol/L (ref 3.5–5.1)
Potassium: 3.9 mmol/L (ref 3.5–5.1)
Potassium: 3.9 mmol/L (ref 3.5–5.1)
Potassium: 4.3 mmol/L (ref 3.5–5.1)
Potassium: 3.5 mmol/L (ref 3.5–5.1)
Sodium: 134 mmol/L — ABNORMAL LOW (ref 135–145)
Sodium: 134 mmol/L — ABNORMAL LOW (ref 135–145)
Sodium: 133 mmol/L — ABNORMAL LOW (ref 135–145)
Sodium: 135 mmol/L (ref 135–145)
Sodium: 135 mmol/L (ref 135–145)
Sodium: 136 mmol/L (ref 135–145)
TCO2: 30 mmol/L (ref 22–32)
TCO2: 26 mmol/L (ref 22–32)
TCO2: 27 mmol/L (ref 22–32)
TCO2: 31 mmol/L (ref 22–32)
TCO2: 29 mmol/L (ref 22–32)
TCO2: 31 mmol/L (ref 22–32)
pCO2 arterial: 50.6 mmHg — ABNORMAL HIGH (ref 32.0–48.0)
pCO2 arterial: 45.5 mmHg (ref 32.0–48.0)
pCO2 arterial: 49.9 mmHg — ABNORMAL HIGH (ref 32.0–48.0)
pCO2 arterial: 54.2 mmHg — ABNORMAL HIGH (ref 32.0–48.0)
pCO2 arterial: 52.7 mmHg — ABNORMAL HIGH (ref 32.0–48.0)
pCO2 arterial: 52.3 mmHg — ABNORMAL HIGH (ref 32.0–48.0)
pH, Arterial: 7.358 (ref 7.350–7.450)
pH, Arterial: 7.344 — ABNORMAL LOW (ref 7.350–7.450)
pH, Arterial: 7.324 — ABNORMAL LOW (ref 7.350–7.450)
pH, Arterial: 7.337 — ABNORMAL LOW (ref 7.350–7.450)
pH, Arterial: 7.318 — ABNORMAL LOW (ref 7.350–7.450)
pH, Arterial: 7.363 (ref 7.350–7.450)
pO2, Arterial: 99 mmHg (ref 83.0–108.0)
pO2, Arterial: 88 mmHg (ref 83.0–108.0)
pO2, Arterial: 99 mmHg (ref 83.0–108.0)
pO2, Arterial: 90 mmHg (ref 83.0–108.0)
pO2, Arterial: 85 mmHg (ref 83.0–108.0)
pO2, Arterial: 76 mmHg — ABNORMAL LOW (ref 83.0–108.0)

## 2020-03-27 LAB — CBC
HCT: 23.2 % — ABNORMAL LOW (ref 39.0–52.0)
HCT: 27.6 % — ABNORMAL LOW (ref 39.0–52.0)
Hemoglobin: 7.9 g/dL — ABNORMAL LOW (ref 13.0–17.0)
Hemoglobin: 9.4 g/dL — ABNORMAL LOW (ref 13.0–17.0)
MCH: 31.2 pg (ref 26.0–34.0)
MCH: 31.2 pg (ref 26.0–34.0)
MCHC: 34.1 g/dL (ref 30.0–36.0)
MCHC: 34.1 g/dL (ref 30.0–36.0)
MCV: 91.7 fL (ref 80.0–100.0)
MCV: 91.7 fL (ref 80.0–100.0)
Platelets: 157 10*3/uL (ref 150–400)
Platelets: 167 10*3/uL (ref 150–400)
RBC: 2.53 MIL/uL — ABNORMAL LOW (ref 4.22–5.81)
RBC: 3.01 MIL/uL — ABNORMAL LOW (ref 4.22–5.81)
RDW: 16.6 % — ABNORMAL HIGH (ref 11.5–15.5)
RDW: 16.5 % — ABNORMAL HIGH (ref 11.5–15.5)
WBC: 9.9 10*3/uL (ref 4.0–10.5)
WBC: 12.4 10*3/uL — ABNORMAL HIGH (ref 4.0–10.5)
nRBC: 0.2 % (ref 0.0–0.2)
nRBC: 0.2 % (ref 0.0–0.2)

## 2020-03-27 LAB — BASIC METABOLIC PANEL
Anion gap: 10 (ref 5–15)
Anion gap: 11 (ref 5–15)
BUN: 23 mg/dL — ABNORMAL HIGH (ref 6–20)
BUN: 28 mg/dL — ABNORMAL HIGH (ref 6–20)
CO2: 26 mmol/L (ref 22–32)
CO2: 25 mmol/L (ref 22–32)
Calcium: 9.4 mg/dL (ref 8.9–10.3)
Calcium: 8.8 mg/dL — ABNORMAL LOW (ref 8.9–10.3)
Chloride: 98 mmol/L (ref 98–111)
Chloride: 98 mmol/L (ref 98–111)
Creatinine, Ser: 1.03 mg/dL (ref 0.61–1.24)
Creatinine, Ser: 0.98 mg/dL (ref 0.61–1.24)
GFR, Estimated: >60 mL/min (ref >60)
GFR, Estimated: >60 mL/min (ref >60)
Glucose, Bld: 148 mg/dL — ABNORMAL HIGH (ref 70–99)
Glucose, Bld: 306 mg/dL — ABNORMAL HIGH (ref 70–99)
Potassium: 3.6 mmol/L (ref 3.5–5.1)
Potassium: 4.3 mmol/L (ref 3.5–5.1)
Sodium: 134 mmol/L — ABNORMAL LOW (ref 135–145)
Sodium: 134 mmol/L — ABNORMAL LOW (ref 135–145)

## 2020-03-27 LAB — HEPATIC FUNCTION PANEL
ALT: 35 U/L (ref 0–44)
AST: 46 U/L — ABNORMAL HIGH (ref 15–41)
Albumin: 3.7 g/dL (ref 3.5–5.0)
Alkaline Phosphatase: 130 U/L — ABNORMAL HIGH (ref 38–126)
Bilirubin, Direct: 0.3 mg/dL — ABNORMAL HIGH (ref 0.0–0.2)
Indirect Bilirubin: 1.8 mg/dL — ABNORMAL HIGH (ref 0.3–0.9)
Total Bilirubin: 2.1 mg/dL — ABNORMAL HIGH (ref 0.3–1.2)
Total Protein: 5.8 g/dL — ABNORMAL LOW (ref 6.5–8.1)

## 2020-03-27 LAB — GLUCOSE, CAPILLARY
Glucose-Capillary: 174 mg/dL — ABNORMAL HIGH (ref 70–99)
Glucose-Capillary: 150 mg/dL — ABNORMAL HIGH (ref 70–99)
Glucose-Capillary: 250 mg/dL — ABNORMAL HIGH (ref 70–99)
Glucose-Capillary: 272 mg/dL — ABNORMAL HIGH (ref 70–99)
Glucose-Capillary: 303 mg/dL — ABNORMAL HIGH (ref 70–99)
Glucose-Capillary: 293 mg/dL — ABNORMAL HIGH (ref 70–99)
Glucose-Capillary: 237 mg/dL — ABNORMAL HIGH (ref 70–99)

## 2020-03-27 LAB — APTT
aPTT: 84 seconds — ABNORMAL HIGH (ref 24–36)
aPTT: 68 seconds — ABNORMAL HIGH (ref 24–36)

## 2020-03-27 LAB — PHOSPHORUS: Phosphorus: 2.3 mg/dL — ABNORMAL LOW (ref 2.5–4.6)

## 2020-03-27 LAB — PREPARE RBC (CROSSMATCH)

## 2020-03-27 LAB — LACTIC ACID, PLASMA
Lactic Acid, Venous: 0.8 mmol/L (ref 0.5–1.9)
Lactic Acid, Venous: 0.9 mmol/L (ref 0.5–1.9)

## 2020-03-27 LAB — PROTIME-INR
INR: 1.9 — ABNORMAL HIGH (ref 0.8–1.2)
Prothrombin Time: 21.3 seconds — ABNORMAL HIGH (ref 11.4–15.2)

## 2020-03-27 LAB — LACTATE DEHYDROGENASE: LDH: 751 U/L — ABNORMAL HIGH (ref 98–192)

## 2020-03-27 LAB — MAGNESIUM: Magnesium: 2.4 mg/dL (ref 1.7–2.4)

## 2020-03-27 LAB — FIBRINOGEN: Fibrinogen: 438 mg/dL (ref 210–475)

## 2020-03-27 MED ORDER — SODIUM CHLORIDE 0.9% IV SOLUTION: Freq: Once | INTRAVENOUS | Status: AC

## 2020-03-27 MED ORDER — INSULIN DETEMIR 100 UNIT/ML ~~LOC~~ SOLN
15.0000 [IU] | Freq: Two times a day (BID) | SUBCUTANEOUS | Status: DC
  Administered 2020-03-27: 15 [IU] via SUBCUTANEOUS
  Filled 2020-03-27 (×3): qty 0.15

## 2020-03-27 MED ORDER — POTASSIUM CHLORIDE 20 MEQ PO PACK
40.0000 meq | PACK | Freq: Once | ORAL | Status: AC
  Administered 2020-03-27: 40 meq
  Filled 2020-03-27: qty 2

## 2020-03-27 MED ORDER — CHLORHEXIDINE GLUCONATE CLOTH 2 % EX PADS
6.0000 | MEDICATED_PAD | Freq: Every day | CUTANEOUS | Status: DC
  Administered 2020-03-28 – 2020-03-29 (×2): 6 via TOPICAL

## 2020-03-27 MED ORDER — FUROSEMIDE 10 MG/ML IJ SOLN
40.0000 mg | Freq: Once | INTRAMUSCULAR | Status: AC
  Administered 2020-03-27: 40 mg via INTRAVENOUS
  Filled 2020-03-27: qty 4

## 2020-03-27 MED ORDER — NEOSTIGMINE METHYLSULFATE 10 MG/10ML IV SOLN
0.2500 mg | Freq: Four times a day (QID) | INTRAVENOUS | Status: DC
  Administered 2020-03-27 – 2020-03-28 (×4): 0.25 mg via SUBCUTANEOUS
  Filled 2020-03-27 (×8): qty 0.25

## 2020-03-27 NOTE — Progress Notes (Signed)
Patient ID: Alex [name], male   DOB: 06/05/1970, 50 y.o.   MRN: [id number]    Advanced Heart Failure Rounding Note  PCP-Cardiologist: No primary care provider on file.   Subjective:    - 12/30: VV ECMO cannulation - 12/31: Left chest tube replaced - 1/2: Extubated. Echo with EF 60-65%, mildly dilated RV with mildly decreased systolic function.  - 1/4: Agitated, suspected aspiration.  Re-intubated.  - 1/5: ECMO cannula repositioned under TEE guidance. TEE showed moderately dilated/moderate-severely dysfunctional RV in setting of hypoxemia. LUE DVT found.  Patient got 1/2 dose of TPA due to initial concern for large PE.  - 1/6: Tracheostomy - 1/7: Echo with mild RV dilation/mild RV dysfunction.   Had vagal episode yesterday with low flow on ECMO when transferring from chair to bed otherwise stable overnight. Got 1u RBCs. Sweep down to 4. BP less lable   ECMO parameters: 3500 rpm Flow 4.8 L/min Pvenous -90 Delta P 28 Sweep 2 -> 11 -> 8 -> 4   ABG 7.35/51/99/97% LDH 410 => 543 => 728 => 1041 => 998 => 1305 =>  => 901 => 1078 => 1,170 => 1,117 => 911 => 751 Lactate 0.8  On vent 50% TV 250-300  Objective:   Weight Range: 65.1 kg Body mass index is 24.64 kg/m.   Vital Signs:   Temp:  [99 F (37.2 C)-100.04 F (37.8 C)] 99.5 F (37.5 C) (01/15 0800) Pulse Rate:  [102-135] 112 (01/15 0800) Resp:  [23-64] 39 (01/15 0800) BP: (86-158)/(55-130) 122/68 (01/15 0800) SpO2:  [94 %-100 %] 97 % (01/15 0800) Arterial Line BP: (98-221)/(51-92) 136/57 (01/15 0800) FiO2 (%):  [50 %] 50 % (01/15 0800) Weight:  [65.1 kg] 65.1 kg (01/15 0645) Last BM Date: 03/28/19  Weight change: Filed Weights   03/25/20 0700 03/26/20 0500 03/27/20 0645  Weight: 77 kg 73.5 kg 65.1 kg    Intake/Output:   Intake/Output Summary (Last 24 hours) at 03/27/2020 0845 Last data filed at 03/27/2020 0800 Gross per 24 hour  Intake 2042.77 ml  Output 3010 ml  Net -967.23 ml      Physical Exam     General:  Awake on vent. Calm  HEENT: normal Neck: supple. RIJ ECMO. + trach Carotids 2+ bilat; no bruits. No lymphadenopathy or thryomegaly appreciated. Cor: PMI nondisplaced. Regular tachy Lungs: clear Abdomen: soft, nontender, nondistended. No hepatosplenomegaly. No bruits or masses. Good bowel sounds. Extremities: no cyanosis, clubbing, rash, edema Neuro: awake follow commands   Telemetry   NSR 100-110 Personally reviewed  Labs    CBC Recent Labs    03/26/20 1700 03/26/20 1720 03/27/20 0329 03/27/20 0337  WBC 12.5*  --  9.9  --   HGB 8.3*   < > 7.9* 7.8*  HCT 23.7*   < > 23.2* 23.0*  MCV 91.9  --  91.7  --   PLT 162  --  157  --    < > = values in this interval not displayed.   Basic Metabolic Panel Recent Labs    03/26/20 1700 03/26/20 1720 03/27/20 0329 03/27/20 0337  NA 131*   < > 134* 134*  K 3.9   < > 3.6 3.5  CL 94*  --  98  --   CO2 26  --  26  --   GLUCOSE 148*  --  148*  --   BUN 23*  --  23*  --   CREATININE 0.97  --  1.03  --   CALCIUM 8.9  --  9.4  --   MG  --   --  2.4  --   PHOS  --   --  2.3*  --    < > = values in this interval not displayed.   Liver Function Tests Recent Labs    03/26/20 0458 03/27/20 0329  AST 51* 46*  ALT 35 35  ALKPHOS 110 130*  BILITOT 3.1* 2.1*  PROT 6.3* 5.8*  ALBUMIN 4.2 3.7   No results for input(s): LIPASE, AMYLASE in the last 72 hours. Cardiac Enzymes No results for input(s): CKTOTAL, CKMB, CKMBINDEX, TROPONINI in the last 72 hours.  BNP: BNP (last 3 results) No results for input(s): BNP in the last 8760 hours.  ProBNP (last 3 results) No results for input(s): PROBNP in the last 8760 hours.   D-Dimer No results for input(s): DDIMER in the last 72 hours. Hemoglobin A1C No results for input(s): HGBA1C in the last 72 hours. Fasting Lipid Panel No results for input(s): CHOL, HDL, LDLCALC, TRIG, CHOLHDL, LDLDIRECT in the last 72 hours. Thyroid Function Tests No results for input(s): TSH,  T4TOTAL, T3FREE, THYROIDAB in the last 72 hours.  Invalid input(s): FREET3  Other results:   Imaging    DG Abd 1 View  Result Date: 03/26/2020 CLINICAL DATA:  Feeding tube placement. EXAM: ABDOMEN - 1 VIEW COMPARISON:  Earlier same day FINDINGS: 1231 hours. Similar position of the feeding tube. Small volume of contrast material is visible in jejunal loops the left abdomen. No contrast visible in the gastric lumen. Diffuse gas-filled loops of small bowel and colon again noted. IMPRESSION: Feeding tube tip positioned in proximal jejunum of the left abdomen. Electronically Signed   By: Misty Stanley M.D.   On: 03/26/2020 12:57   DG Abd Portable 1V  Result Date: 03/26/2020 CLINICAL DATA:  50 year old male feeding tube placement. EXAM: PORTABLE ABDOMEN - 1 VIEW COMPARISON:  0612 hours today and earlier. FINDINGS: Portable AP supine view at 0932 hours. Feeding tube remains in place, and the bulk of the tube is located in the midline and to the left, but the tube does have a figure of 8 configuration suggesting there has been post pyloric transit of the tip to the level of the ligament of Treitz. Contrast injection through the tube and repeat portable film would confirm. ECMO cannula visible. Negative visible bowel gas pattern. Rectal tube visible. No acute osseous abnormality identified. IMPRESSION: 1. Although most of the enteric feeding tube remains in the left abdomen its configuration now does suggest the tip is in small bowel. Small volume of contrast injection via the tube (25-50 mL of water-soluble contrast would suffice) with immediate post-injection repeat portable abdominal radiograph would confirm. 2. Normal bowel gas pattern. Partially visible ECMO cannula, rectal tube. Electronically Signed   By: Genevie Ann M.D.   On: 03/26/2020 09:47     Medications:     Scheduled Medications: . sodium chloride   Intravenous Once  . barium  200 mL Oral Once  . chlorhexidine gluconate (MEDLINE KIT)  15  mL Mouth Rinse BID  . Chlorhexidine Gluconate Cloth  6 each Topical Daily  . clonazePAM  1 mg Per Tube Q6H  . cloNIDine  0.2 mg Per Tube Q8H  . docusate  100 mg Per Tube BID  . furosemide  40 mg Intravenous Once  . guaiFENesin-dextromethorphan  10 mL Per Tube BID  . insulin aspart  0-15 Units Subcutaneous Q4H  . living well with diabetes book   Does not apply  Once  . mouth rinse  15 mL Mouth Rinse 10 times per day  . metoCLOPramide (REGLAN) injection  10 mg Intravenous Q6H  . metoprolol tartrate  25 mg Per Tube BID  . neostigmine  0.25 mg Subcutaneous Q6H  . oxyCODONE  20 mg Per Tube Q6H  . pantoprazole sodium  40 mg Per Tube QHS  . polyethylene glycol  17 g Per Tube BID  . predniSONE  20 mg Per Tube Q breakfast   Followed by  . [START ON 03/29/2020] predniSONE  10 mg Per Tube Q breakfast  . QUEtiapine  100 mg Per Tube TID  . sennosides  10 mL Per Tube BID  . sodium chloride flush  10-40 mL Intracatheter Q12H    Infusions: . sodium chloride    . sodium chloride Stopped (03/23/20 0951)  . sodium chloride Stopped (03/26/20 0829)  . sodium chloride Stopped (03/12/20 0131)  . albumin human Stopped (03/27/20 0702)  . albumin human 12.5 g (03/25/20 0231)  . bivalirudin (ANGIOMAX) infusion 0.5 mg/mL (Non-ACS indications) 0.17 mg/kg/hr (03/27/20 0800)  . dextrose Stopped (03/25/20 1444)  . feeding supplement (PIVOT 1.5 CAL) 20 mL/hr at 03/27/20 0700  . fentaNYL infusion INTRAVENOUS Stopped (03/25/20 1549)    PRN Medications: Place/Maintain arterial line **AND** sodium chloride, sodium chloride, sodium chloride, acetaminophen (TYLENOL) oral liquid 160 mg/5 mL, albumin human, albumin human, chlorpheniramine-HYDROcodone, dextrose, fentaNYL (SUBLIMAZE) injection, guaiFENesin, haloperidol lactate, hydrALAZINE, labetalol, lip balm, LORazepam, ondansetron (ZOFRAN) IV, oxyCODONE, sodium chloride flush   Assessment/Plan   1. Acute hypoxemic respiratory failure: Due to COVID-19 PNA with  bilateral infiltrates.  Refractory hypoxemia, VV-ECMO cannulation on 02/18/2020 with improvement in oxygenation.  Developed left PTX post-subclavian CVL and has left chest tube, the left lung is re-expanded.  He was extubated 1/2 but reintubated 1/4 with agitation and suspected aspiration.  Tracheostomy 1/6.  CXR with bilateral infiltrates, improved today.  He is now on cefepime for empiric abx coverage.  ECMO cannula repositioned 1/5.  LDH continues to come down today - Patient has had remdesivir, tocilizumab. - Ongoing steroids with prednisone.  - ECMO circuit functioning appropriately despite high LDH. Several clots in oxygenator. Post oxygenator gas ok on 1/12. LDH coming down again today - Continue bivalirudin, goal PTT 60-80. He is at 69 today Discussed dosing with PharmD personally. - Off cefepime 1(stopped 1/12) -> Afebrile.  Recultured 1/12. NGTD. Follow closely.  - Failed sweep trial 1/12. Now weaning sweep. Down to 4. Hopefully can continue to wean today. CXR unchanged today. Give lasix 40IV to keep even 2. RLE DVT/LUE DVT/thrombus in RV/suspect PE: Echo with moderately dilated and moderately dysfunctional RV.  Clot noted on TEE in RV as well.  TTE 1/2 showed normal EF 60-65%, RV improved (mildly dilated/dysfunctional). TEE on 1/5 with moderate to severe RV dysfunction but patient was hypoxemic.  Had 1/2 dose TPA on 1/5. Echo 1/7 with mildly dilated/mildly dysfunctional RV.  - Bivalirudin for goal PTT 60-80.  Management as above 3. Left PTX: Left chest tube, lung is re-expanded.   4. Shock: Suspect septic/distributive.  Now resolved, off NE.  5. Anemia: hgb 7.9 transfuse < 8.   6. AKI: Resolved.  7. Hyperglycemia: insulin.  8. HTN: Following cuff pressure (whip in ABG). BP remains very labile but improving some Continue to adjust meds as needed 9. CHB: Episode of CHB when hypoxemic.  NSR since then.  - With elevated HR, will continue  metoprolol 25 mg bid but follow closely.  10.  Thrombocytopenia: Resolved  11. Ileus: TFs restarted. Remains at trickle feeds - Getting Reglan and neostigmine.  - Continue Relastor and sorbitol - Cor-trak repositioned to post-pyloric placement 12. Ischemic digits - follow   CRITICAL CARE Performed by: Glori Bickers  Total critical care time: 35  minutes  Critical care time was exclusive of separately billable procedures and treating other patients.  Critical care was necessary to treat or prevent imminent or life-threatening deterioration.  Critical care was time spent personally by me on the following activities: development of treatment plan with patient and/or surrogate as well as nursing, discussions with consultants, evaluation of patient's response to treatment, examination of patient, obtaining history from patient or surrogate, ordering and performing treatments and interventions, ordering and review of laboratory studies, ordering and review of radiographic studies, pulse oximetry and re-evaluation of patient's condition.    Length of Stay: [place], MD  03/27/2020, 8:45 AM  Advanced Heart Failure Team Pager [phone] (M-F; 7a - 4p)  Please contact [facility] for night-coverage after hours (4p -7a ) and weekends on [email or web address]

## 2020-03-27 NOTE — Procedures (Signed)
Extracorporeal support note  ECLS cannulation date: 12/30 Last circuit change: n/a  Indication: Severe respiratory failure secondary to COVID-19 pneumonia with RV dysfunction  Configuration: Venovenous  Drainage cannula: 32 French crescent cannula via right IJ Return cannula: Same  Pump speed: 3500 RPM Pump flow: 4.79 L/min Pump used: Cardio help  Oxygenator: Cardio help O2 blender: 100% Sweep gas: 4L  Circuit check: clots at corners of oxygenator L>R, stable Anticoagulant: Bivalirudin Anticoagulation targets: PTT 60-80  Changes in support: keep trying to wean sweep  Anticipated goals/duration of support: Bridge to recovery.  Multidisciplinary ECMO rounds completed.   Lorin Glass, MD 03/27/20 8:11 AM [facility]

## 2020-03-27 NOTE — Progress Notes (Signed)
ANTICOAGULATION CONSULT NOTE - Follow Up Consult  Pharmacy Consult for bivalirudin Indication: ECMO and VTE  Labs: Recent Labs    03/25/20 0504 03/25/20 0514 03/26/20 0458 03/26/20 0459 03/26/20 1700 03/26/20 1720 03/26/20 2012 03/27/20 0329 03/27/20 0337  HGB 8.9*   < > 8.2*   < > 8.3*   < > 7.1* 7.9* 7.8*  HCT 25.9*   < > 25.3*   < > 23.7*   < > 21.0* 23.2* 23.0*  PLT 150   < > 149*  --  162  --   --  157  --   APTT 77*   < > 64*  --  76*  --   --  84*  --   LABPROT 21.2*  --  20.7*  --   --   --   --  21.3*  --   INR 1.9*  --  1.9*  --   --   --   --  1.9*  --   CREATININE 0.99   < > 1.10  --  0.97  --   --  1.03  --    < > = values in this interval not displayed.    Assessment: 37 yoM admitted with COVID-19 PNA with worsening hypoxia, s/p cannulation for ECMO. Pt was started on IV heparin prior to cannulation due to acute DVTs and possible PE,  transitioned to bivalirudin with ECMO. Now s/p tPA on 1/5 and tracheostomy on 1/6.   APTT slightly above goal at 84 seconds, CBC low but stable, LDH trending down.   Goal of Therapy:  aPTT 60-80 seconds   Plan:  Reduce bivalirudin to 0.17 mg/kg/hr Monitor CBC, 0500/1700 aPTT, LDH, and for s/sx of bleeding   Fredonia Highland, PharmD, BCPS, [facility] Clinical Pharmacist [phone] Please check AMION for all [facility] Pharmacy numbers 03/27/2020

## 2020-03-27 NOTE — Progress Notes (Signed)
ANTICOAGULATION CONSULT NOTE - Follow Up Consult  Pharmacy Consult for bivalirudin Indication: ECMO and VTE  Labs: Recent Labs    03/25/20 0504 03/25/20 0514 03/26/20 0458 03/26/20 0459 03/26/20 1700 03/26/20 1720 03/27/20 0329 03/27/20 0337 03/27/20 1133 03/27/20 1654 03/27/20 1701  HGB 8.9*   < > 8.2*   < > 8.3*   < > 7.9*   < > 10.2* 9.4* 9.5*  HCT 25.9*   < > 25.3*   < > 23.7*   < > 23.2*   < > 30.0* 27.6* 28.0*  PLT 150   < > 149*  --  162  --  157  --   --  167  --   APTT 77*   < > 64*  --  76*  --  84*  --   --  68*  --   LABPROT 21.2*  --  20.7*  --   --   --  21.3*  --   --   --   --   INR 1.9*  --  1.9*  --   --   --  1.9*  --   --   --   --   CREATININE 0.99   < > 1.10  --  0.97  --  1.03  --   --  0.98  --    < > = values in this interval not displayed.    Assessment: 71 yoM admitted with COVID-19 PNA with worsening hypoxia, s/p cannulation for ECMO. Pt was started on IV heparin prior to cannulation due to acute DVTs and possible PE,  transitioned to bivalirudin with ECMO. Now s/p tPA on 1/5 and tracheostomy on 1/6.   APTT at goal at 68 seconds on recheck this evening, CBC low but stable, LDH trending down.  Goal of Therapy:  aPTT 60-80 seconds   Plan:  Continue Bivalirudan at 0.17 mg/kg/hr Monitor CBC, 0500/1700 aPTT, LDH, and for s/sx of bleeding   Sheppard Coil PharmD., BCPS Clinical Pharmacist 03/27/2020 6:05 PM  Please check AMION for all [facility] Pharmacy numbers 03/27/2020

## 2020-03-27 NOTE — Plan of Care (Signed)
  Problem: Education: Goal: Knowledge of General Education information will improve Description: Including pain rating scale, medication(s)/side effects and non-pharmacologic comfort measures Outcome: Progressing   Problem: Clinical Measurements: Goal: Ability to maintain clinical measurements within normal limits will improve Outcome: Progressing Goal: Will remain free from infection Outcome: Progressing Goal: Diagnostic test results will improve Outcome: Progressing Goal: Respiratory complications will improve Outcome: Progressing Goal: Cardiovascular complication will be avoided Outcome: Progressing   Problem: Activity: Goal: Risk for activity intolerance will decrease Outcome: Progressing   Problem: Pain Managment: Goal: General experience of comfort will improve Outcome: Progressing   Problem: Education: Goal: Knowledge of risk factors and measures for prevention of condition will improve Outcome: Progressing

## 2020-03-27 NOTE — Progress Notes (Signed)
ECMO PROGRESS NOTE  NAME:  Alex [name], MRN:  [id number], DOB:  02/28/1971, LOS: 18 ADMISSION DATE:  02/27/2020, CONSULTATION DATE: 03/21/2020 REFERRING MD: Wynona Neat -LBPCCM, CHIEF COMPLAINT: Respiratory failure requiring ECMO  HPI/course in hospital  50 year old man admitted to hospital 12/28 with 1 week history of dyspnea cough nausea and vomiting.  Initially admitted to [facility] long hospital and placed on high flow nasal cannula but rapidly failed and required intubation 12/29.  Persistent hypoxic respiratory failure with PF ratio 55 in spite of 18 of PEEP FiO2 0.1 despite paralytics.  Did not improve with prone ventilation  Cannulated for VV ECMO 12/30 via right IJ crescent cannula. Iatrogenic pneumothorax from left subclavian triple-lumen placement  Family confirms no significant past medical history apart from possible prediabetes  Past Medical History  none  Interim history/subjective:  1/13: no great issues overnight. Stopping lasix infusion and dosing daily. Will give dose of acetazolamide. Fever down and normothermic now. precedex off and this has greatly helped his mentation. Also started having bm 1/12: awake communicative just stood multiple times and so is tachypneic and fatigues. Febrile this am to 99.1, turn down heater temp for now, pan cx but pt has been on broad abx with vanc just stopping Monday and cefepime ongoing. Will aos attempt to stop precedex as can contribute and if able to settle out will wean sweep and see. Circuit will likely need change in next few days if unable to come off.  1/11: awake and able to communicate. Able to wean to 2 sweep. Still with good diuresis. Able to speak with in line speaking valve.  1/10: still on escalated sedation. Will increase po to wean continuous, wbc normal. Lactate normal. Will stop vanc (neg pcr)  Objective   Blood pressure 122/68, pulse (!) 112, temperature 99.5 F (37.5 C), temperature source Core, resp. rate (!) 39,  height 5\' 4"  (1.626 m), weight 65.1 kg, SpO2 97 %.    Vent Mode: PCV FiO2 (%):  [50 %] 50 % Set Rate:  [20 bmp] 20 bmp PEEP:  [3 cmH20-10 cmH20] 8 cmH20 Plateau Pressure:  [18 cmH20-21 cmH20] 18 cmH20   Intake/Output Summary (Last 24 hours) at 03/27/2020 0816 Last data filed at 03/27/2020 0800 Gross per 24 hour  Intake 2042.77 ml  Output 3010 ml  Net -967.23 ml   Filed Weights   03/25/20 0700 03/26/20 0500 03/27/20 0645  Weight: 77 kg 73.5 kg 65.1 kg    ECMO Device: Cardiohelp  ECMO Mode: VV  Flow (LPM): 4.79   Examination: Constitutional: no acute distress lying in bed  Eyes: eomi, pupils equal Ears, nose, mouth, and throat: trach in place, small bloody secretions Cardiovascular: slightly tachycardic, ext warm Respiratory: scattered rhonci bases, triggering vent Gastrointestinal: soft, +BS Skin: No rashes, normal turgor, L hand wrapped, stable ischemic changes of digits per RN Neurologic: moves all 4 ext briskly to command Psychiatric: RASS 0, responding appropriately to questions   Net -.5L yesterday, -5.0L for admission CXR stable, chest tube in place Cr stable Liver function improved Lactic acid benign LDH 1100>>911>>700s Plts 149>>149>>157 Hgb 9.1>>8.2>>7.9>>1 unit WBC 14>>10>>12>>10 No fevers Off abx CBG okay   Assessment & Plan:  Acute hypoxemic and hypercarbic respiratory failure secondary to severe COVID ARDS, likely PE, and RV dysfunction s/p VV ECMO. Need for sedation with mechanical ventilation - Continue VV support. Monitor usual parameters - Daily CXR - Keep on drier side - Ultraprotective lung ventilation, will start increasing PC a bit today to  see if we can wean sweep more - Finish steroid course - Bival targeting ptt 60-80 - Lasix x 1 today, goal even - Continue PO klonipin (keep dose stable today), seroquel (keep dose stable today), oxycodone, clonidine, PRN versed, PRN fentanyl  Intermittent hypertension and likely flash pulmonary edema-  seems related to sedation, need to be careful with sweep and sedation changes - Continue metoprolol - Maintain euvolemia  Gastroparesis with some element ileus-  - Continue neostigmine, reglan - start postpyloric TF  Ischemic changes L hand- due to pressors and LUE DVT impeding blood flow, stable, monitor, L radial line as been removed  DM2 with hyperglycemia- SSI for now while   Muscular deconditioning- appreciate PT help up to chair daily  Best practice:  Diet: TF Pain/Anxiety/Delirium protocol (if indicated): see above VAP protocol (if indicated): in place DVT prophylaxis: bival gtt GI prophylaxis: PPI Glucose control: SSI Mobility: up with PT as tolerated Code Status: full Family Communication: per ecmo team Disposition: ICU   Patient critically ill due to COVID ARDS Interventions to address this today vent titration, sedation titration, ECMO titration Risk of deterioration without these interventions is high  I personally spent 35 minutes providing critical care not including any separately billable procedures  Myrla Halsted MD [facility] 03/27/2020 8:16 AM Personal pager: [phone] If unanswered, please page CCM On-call: #[phone]

## 2020-03-28 ENCOUNTER — Inpatient Hospital Stay (HOSPITAL_COMMUNITY): Payer: Medicaid Other

## 2020-03-28 LAB — POCT I-STAT 7, (LYTES, BLD GAS, ICA,H+H)
Acid-Base Excess: 3 mmol/L — ABNORMAL HIGH (ref 0.0–2.0)
Acid-Base Excess: 2 mmol/L (ref 0.0–2.0)
Acid-Base Excess: 3 mmol/L — ABNORMAL HIGH (ref 0.0–2.0)
Acid-Base Excess: 5 mmol/L — ABNORMAL HIGH (ref 0.0–2.0)
Bicarbonate: 29.4 mmol/L — ABNORMAL HIGH (ref 20.0–28.0)
Bicarbonate: 27.8 mmol/L (ref 20.0–28.0)
Bicarbonate: 29.3 mmol/L — ABNORMAL HIGH (ref 20.0–28.0)
Bicarbonate: 30.2 mmol/L — ABNORMAL HIGH (ref 20.0–28.0)
Calcium, Ion: 1.26 mmol/L (ref 1.15–1.40)
Calcium, Ion: 1.2 mmol/L (ref 1.15–1.40)
Calcium, Ion: 1.26 mmol/L (ref 1.15–1.40)
Calcium, Ion: 1.24 mmol/L (ref 1.15–1.40)
HCT: 27 % — ABNORMAL LOW (ref 39.0–52.0)
HCT: 29 % — ABNORMAL LOW (ref 39.0–52.0)
HCT: 30 % — ABNORMAL LOW (ref 39.0–52.0)
HCT: 28 % — ABNORMAL LOW (ref 39.0–52.0)
Hemoglobin: 9.2 g/dL — ABNORMAL LOW (ref 13.0–17.0)
Hemoglobin: 9.9 g/dL — ABNORMAL LOW (ref 13.0–17.0)
Hemoglobin: 10.2 g/dL — ABNORMAL LOW (ref 13.0–17.0)
Hemoglobin: 9.5 g/dL — ABNORMAL LOW (ref 13.0–17.0)
O2 Saturation: 92 %
O2 Saturation: 90 %
O2 Saturation: 88 %
O2 Saturation: 89 %
Patient temperature: 37.1
Patient temperature: 36.8
Patient temperature: 36.8
Patient temperature: 36.6
Potassium: 3.5 mmol/L (ref 3.5–5.1)
Potassium: 4.1 mmol/L (ref 3.5–5.1)
Potassium: 4.5 mmol/L (ref 3.5–5.1)
Potassium: 4.5 mmol/L (ref 3.5–5.1)
Sodium: 138 mmol/L (ref 135–145)
Sodium: 138 mmol/L (ref 135–145)
Sodium: 138 mmol/L (ref 135–145)
Sodium: 142 mmol/L (ref 135–145)
TCO2: 31 mmol/L (ref 22–32)
TCO2: 29 mmol/L (ref 22–32)
TCO2: 31 mmol/L (ref 22–32)
TCO2: 32 mmol/L (ref 22–32)
pCO2 arterial: 52.9 mmHg — ABNORMAL HIGH (ref 32.0–48.0)
pCO2 arterial: 46.8 mmHg (ref 32.0–48.0)
pCO2 arterial: 53.9 mmHg — ABNORMAL HIGH (ref 32.0–48.0)
pCO2 arterial: 48.6 mmHg — ABNORMAL HIGH (ref 32.0–48.0)
pH, Arterial: 7.354 (ref 7.350–7.450)
pH, Arterial: 7.38 (ref 7.350–7.450)
pH, Arterial: 7.342 — ABNORMAL LOW (ref 7.350–7.450)
pH, Arterial: 7.399 (ref 7.350–7.450)
pO2, Arterial: 70 mmHg — ABNORMAL LOW (ref 83.0–108.0)
pO2, Arterial: 61 mmHg — ABNORMAL LOW (ref 83.0–108.0)
pO2, Arterial: 59 mmHg — ABNORMAL LOW (ref 83.0–108.0)
pO2, Arterial: 55 mmHg — ABNORMAL LOW (ref 83.0–108.0)

## 2020-03-28 LAB — BASIC METABOLIC PANEL
Anion gap: 10 (ref 5–15)
BUN: 36 mg/dL — ABNORMAL HIGH (ref 6–20)
CO2: 26 mmol/L (ref 22–32)
Calcium: 9.1 mg/dL (ref 8.9–10.3)
Chloride: 103 mmol/L (ref 98–111)
Creatinine, Ser: 0.82 mg/dL (ref 0.61–1.24)
GFR, Estimated: >60 mL/min (ref >60)
Glucose, Bld: 421 mg/dL — ABNORMAL HIGH (ref 70–99)
Potassium: 4.8 mmol/L (ref 3.5–5.1)
Sodium: 139 mmol/L (ref 135–145)

## 2020-03-28 LAB — CBC
HCT: 29.2 % — ABNORMAL LOW (ref 39.0–52.0)
HCT: 30.8 % — ABNORMAL LOW (ref 39.0–52.0)
Hemoglobin: 9.7 g/dL — ABNORMAL LOW (ref 13.0–17.0)
Hemoglobin: 9.9 g/dL — ABNORMAL LOW (ref 13.0–17.0)
MCH: 30.8 pg (ref 26.0–34.0)
MCH: 29.9 pg (ref 26.0–34.0)
MCHC: 33.2 g/dL (ref 30.0–36.0)
MCHC: 32.1 g/dL (ref 30.0–36.0)
MCV: 92.7 fL (ref 80.0–100.0)
MCV: 93.1 fL (ref 80.0–100.0)
Platelets: 155 10*3/uL (ref 150–400)
Platelets: 184 10*3/uL (ref 150–400)
RBC: 3.15 MIL/uL — ABNORMAL LOW (ref 4.22–5.81)
RBC: 3.31 MIL/uL — ABNORMAL LOW (ref 4.22–5.81)
RDW: 16.9 % — ABNORMAL HIGH (ref 11.5–15.5)
RDW: 17.2 % — ABNORMAL HIGH (ref 11.5–15.5)
WBC: 11.7 10*3/uL — ABNORMAL HIGH (ref 4.0–10.5)
WBC: 13.1 10*3/uL — ABNORMAL HIGH (ref 4.0–10.5)
nRBC: 0.2 % (ref 0.0–0.2)
nRBC: 0.2 % (ref 0.0–0.2)

## 2020-03-28 LAB — RENAL FUNCTION PANEL
Albumin: 3.7 g/dL (ref 3.5–5.0)
Anion gap: 10 (ref 5–15)
BUN: 31 mg/dL — ABNORMAL HIGH (ref 6–20)
CO2: 27 mmol/L (ref 22–32)
Calcium: 8.9 mg/dL (ref 8.9–10.3)
Chloride: 99 mmol/L (ref 98–111)
Creatinine, Ser: 0.83 mg/dL (ref 0.61–1.24)
GFR, Estimated: >60 mL/min (ref >60)
Glucose, Bld: 290 mg/dL — ABNORMAL HIGH (ref 70–99)
Phosphorus: 1.9 mg/dL — ABNORMAL LOW (ref 2.5–4.6)
Potassium: 3.6 mmol/L (ref 3.5–5.1)
Sodium: 136 mmol/L (ref 135–145)

## 2020-03-28 LAB — HEPATIC FUNCTION PANEL
ALT: 36 U/L (ref 0–44)
AST: 37 U/L (ref 15–41)
Albumin: 3.7 g/dL (ref 3.5–5.0)
Alkaline Phosphatase: 131 U/L — ABNORMAL HIGH (ref 38–126)
Bilirubin, Direct: 0.3 mg/dL — ABNORMAL HIGH (ref 0.0–0.2)
Indirect Bilirubin: 1.3 mg/dL — ABNORMAL HIGH (ref 0.3–0.9)
Total Bilirubin: 1.6 mg/dL — ABNORMAL HIGH (ref 0.3–1.2)
Total Protein: 6 g/dL — ABNORMAL LOW (ref 6.5–8.1)

## 2020-03-28 LAB — GLUCOSE, CAPILLARY
Glucose-Capillary: 229 mg/dL — ABNORMAL HIGH (ref 70–99)
Glucose-Capillary: 167 mg/dL — ABNORMAL HIGH (ref 70–99)
Glucose-Capillary: 294 mg/dL — ABNORMAL HIGH (ref 70–99)
Glucose-Capillary: 295 mg/dL — ABNORMAL HIGH (ref 70–99)
Glucose-Capillary: 368 mg/dL — ABNORMAL HIGH (ref 70–99)
Glucose-Capillary: 259 mg/dL — ABNORMAL HIGH (ref 70–99)
Glucose-Capillary: 395 mg/dL — ABNORMAL HIGH (ref 70–99)
Glucose-Capillary: 182 mg/dL — ABNORMAL HIGH (ref 70–99)

## 2020-03-28 LAB — APTT
aPTT: 67 seconds — ABNORMAL HIGH (ref 24–36)
aPTT: 68 seconds — ABNORMAL HIGH (ref 24–36)

## 2020-03-28 LAB — LACTIC ACID, PLASMA
Lactic Acid, Venous: 0.9 mmol/L (ref 0.5–1.9)
Lactic Acid, Venous: 1.5 mmol/L (ref 0.5–1.9)

## 2020-03-28 LAB — MAGNESIUM: Magnesium: 2.4 mg/dL (ref 1.7–2.4)

## 2020-03-28 LAB — LACTATE DEHYDROGENASE: LDH: 699 U/L — ABNORMAL HIGH (ref 98–192)

## 2020-03-28 LAB — FIBRINOGEN: Fibrinogen: 440 mg/dL (ref 210–475)

## 2020-03-28 MED ORDER — INSULIN ASPART 100 UNIT/ML ~~LOC~~ SOLN
6.0000 [IU] | SUBCUTANEOUS | Status: DC
  Administered 2020-03-28 – 2020-03-29 (×4): 6 [IU] via SUBCUTANEOUS

## 2020-03-28 MED ORDER — HYDROMORPHONE HCL 1 MG/ML IJ SOLN
2.0000 mg | INTRAMUSCULAR | Status: DC | PRN
  Administered 2020-03-28 – 2020-03-29 (×7): 2 mg via INTRAVENOUS
  Administered 2020-03-29: 1 mg via INTRAVENOUS
  Administered 2020-03-29: 2 mg via INTRAVENOUS
  Filled 2020-03-28 (×9): qty 2

## 2020-03-28 MED ORDER — INSULIN DETEMIR 100 UNIT/ML ~~LOC~~ SOLN
30.0000 [IU] | Freq: Two times a day (BID) | SUBCUTANEOUS | Status: DC
  Administered 2020-03-28 – 2020-03-29 (×4): 30 [IU] via SUBCUTANEOUS
  Filled 2020-03-28 (×6): qty 0.3

## 2020-03-28 MED ORDER — HYDROMORPHONE HCL 1 MG/ML IJ SOLN: 2.0000 mg | INTRAMUSCULAR | Status: DC | PRN

## 2020-03-28 MED ORDER — POTASSIUM PHOSPHATES 15 MMOLE/5ML IV SOLN
20.0000 mmol | Freq: Once | INTRAVENOUS | Status: AC
  Administered 2020-03-28: 20 mmol via INTRAVENOUS
  Filled 2020-03-28: qty 6.67

## 2020-03-28 MED ORDER — INSULIN ASPART 100 UNIT/ML ~~LOC~~ SOLN
0.0000 [IU] | SUBCUTANEOUS | Status: DC
  Administered 2020-03-28: 4 [IU] via SUBCUTANEOUS
  Administered 2020-03-28 – 2020-03-29 (×3): 11 [IU] via SUBCUTANEOUS
  Administered 2020-03-29: 4 [IU] via SUBCUTANEOUS
  Administered 2020-03-29: 3 [IU] via SUBCUTANEOUS
  Administered 2020-03-29 – 2020-03-30 (×4): 4 [IU] via SUBCUTANEOUS
  Administered 2020-03-30: 7 [IU] via SUBCUTANEOUS
  Administered 2020-03-30: 3 [IU] via SUBCUTANEOUS
  Administered 2020-03-31 (×2): 4 [IU] via SUBCUTANEOUS
  Administered 2020-04-01: 3 [IU] via SUBCUTANEOUS
  Administered 2020-04-01: 4 [IU] via SUBCUTANEOUS
  Administered 2020-04-01: 3 [IU] via SUBCUTANEOUS
  Administered 2020-04-01 – 2020-04-02 (×3): 4 [IU] via SUBCUTANEOUS
  Administered 2020-04-02: 7 [IU] via SUBCUTANEOUS
  Administered 2020-04-02: 4 [IU] via SUBCUTANEOUS
  Administered 2020-04-02: 7 [IU] via SUBCUTANEOUS
  Administered 2020-04-02: 3 [IU] via SUBCUTANEOUS
  Administered 2020-04-02: 4 [IU] via SUBCUTANEOUS
  Administered 2020-04-03: 7 [IU] via SUBCUTANEOUS
  Administered 2020-04-03: 3 [IU] via SUBCUTANEOUS
  Administered 2020-04-03 – 2020-04-04 (×2): 4 [IU] via SUBCUTANEOUS
  Administered 2020-04-04 (×2): 3 [IU] via SUBCUTANEOUS
  Administered 2020-04-04 – 2020-04-05 (×5): 4 [IU] via SUBCUTANEOUS
  Administered 2020-04-05: 3 [IU] via SUBCUTANEOUS
  Administered 2020-04-05: 4 [IU] via SUBCUTANEOUS
  Administered 2020-04-05: 3 [IU] via SUBCUTANEOUS
  Administered 2020-04-06: 4 [IU] via SUBCUTANEOUS
  Administered 2020-04-06: 3 [IU] via SUBCUTANEOUS
  Administered 2020-04-06: 4 [IU] via SUBCUTANEOUS
  Administered 2020-04-06: 3 [IU] via SUBCUTANEOUS
  Administered 2020-04-06: 4 [IU] via SUBCUTANEOUS
  Administered 2020-04-07 (×2): 3 [IU] via SUBCUTANEOUS
  Administered 2020-04-07 (×2): 4 [IU] via SUBCUTANEOUS
  Administered 2020-04-07: 3 [IU] via SUBCUTANEOUS
  Administered 2020-04-08: 4 [IU] via SUBCUTANEOUS
  Administered 2020-04-08: 3 [IU] via SUBCUTANEOUS
  Administered 2020-04-08: 4 [IU] via SUBCUTANEOUS
  Administered 2020-04-08 (×2): 7 [IU] via SUBCUTANEOUS
  Administered 2020-04-09 (×3): 4 [IU] via SUBCUTANEOUS
  Administered 2020-04-09: 3 [IU] via SUBCUTANEOUS
  Administered 2020-04-09 (×2): 4 [IU] via SUBCUTANEOUS
  Administered 2020-04-10: 7 [IU] via SUBCUTANEOUS
  Administered 2020-04-10: 3 [IU] via SUBCUTANEOUS
  Administered 2020-04-10: 4 [IU] via SUBCUTANEOUS
  Administered 2020-04-10: 7 [IU] via SUBCUTANEOUS
  Administered 2020-04-11: 3 [IU] via SUBCUTANEOUS
  Administered 2020-04-11: 11 [IU] via SUBCUTANEOUS
  Administered 2020-04-11 – 2020-04-12 (×6): 4 [IU] via SUBCUTANEOUS
  Administered 2020-04-13: 3 [IU] via SUBCUTANEOUS
  Administered 2020-04-14 (×2): 4 [IU] via SUBCUTANEOUS
  Administered 2020-04-14: 3 [IU] via SUBCUTANEOUS
  Administered 2020-04-14: 4 [IU] via SUBCUTANEOUS
  Administered 2020-04-14: 7 [IU] via SUBCUTANEOUS
  Administered 2020-04-14 – 2020-04-15 (×2): 4 [IU] via SUBCUTANEOUS
  Administered 2020-04-15 – 2020-04-16 (×4): 3 [IU] via SUBCUTANEOUS
  Administered 2020-04-16 (×2): 4 [IU] via SUBCUTANEOUS
  Administered 2020-04-17 (×2): 11 [IU] via SUBCUTANEOUS
  Administered 2020-04-17: 3 [IU] via SUBCUTANEOUS
  Administered 2020-04-17 – 2020-04-18 (×3): 11 [IU] via SUBCUTANEOUS
  Administered 2020-04-18: 7 [IU] via SUBCUTANEOUS
  Administered 2020-04-18 (×2): 15 [IU] via SUBCUTANEOUS
  Administered 2020-04-18: 7 [IU] via SUBCUTANEOUS
  Administered 2020-04-19: 11 [IU] via SUBCUTANEOUS
  Administered 2020-04-19: 15 [IU] via SUBCUTANEOUS
  Administered 2020-04-19: 11 [IU] via SUBCUTANEOUS
  Administered 2020-04-19: 15 [IU] via SUBCUTANEOUS
  Administered 2020-04-19: 7 [IU] via SUBCUTANEOUS
  Administered 2020-04-19: 11 [IU] via SUBCUTANEOUS
  Administered 2020-04-20: 3 [IU] via SUBCUTANEOUS
  Administered 2020-04-20 (×3): 7 [IU] via SUBCUTANEOUS
  Administered 2020-04-20: 4 [IU] via SUBCUTANEOUS
  Administered 2020-04-20: 7 [IU] via SUBCUTANEOUS
  Administered 2020-04-20 – 2020-04-21 (×3): 4 [IU] via SUBCUTANEOUS
  Administered 2020-04-21: 3 [IU] via SUBCUTANEOUS
  Administered 2020-04-21 – 2020-04-22 (×2): 4 [IU] via SUBCUTANEOUS
  Administered 2020-04-22: 3 [IU] via SUBCUTANEOUS
  Administered 2020-04-22 (×2): 4 [IU] via SUBCUTANEOUS
  Administered 2020-04-22 – 2020-04-23 (×3): 3 [IU] via SUBCUTANEOUS
  Administered 2020-04-23: 4 [IU] via SUBCUTANEOUS
  Administered 2020-04-23 – 2020-04-24 (×3): 3 [IU] via SUBCUTANEOUS
  Administered 2020-04-24: 4 [IU] via SUBCUTANEOUS
  Administered 2020-04-24 – 2020-04-25 (×4): 3 [IU] via SUBCUTANEOUS
  Administered 2020-04-26: 11 [IU] via SUBCUTANEOUS
  Administered 2020-04-26 – 2020-04-27 (×2): 7 [IU] via SUBCUTANEOUS
  Administered 2020-04-27 (×3): 3 [IU] via SUBCUTANEOUS
  Administered 2020-04-28: 4 [IU] via SUBCUTANEOUS
  Administered 2020-04-28: 7 [IU] via SUBCUTANEOUS
  Administered 2020-04-28 (×3): 3 [IU] via SUBCUTANEOUS
  Administered 2020-04-28 (×2): 7 [IU] via SUBCUTANEOUS
  Administered 2020-04-29: 11 [IU] via SUBCUTANEOUS
  Administered 2020-04-29: 4 [IU] via SUBCUTANEOUS
  Administered 2020-04-29: 3 [IU] via SUBCUTANEOUS
  Administered 2020-04-29: 4 [IU] via SUBCUTANEOUS
  Administered 2020-04-30: 3 [IU] via SUBCUTANEOUS
  Administered 2020-04-30 (×2): 7 [IU] via SUBCUTANEOUS
  Administered 2020-04-30: 11 [IU] via SUBCUTANEOUS
  Administered 2020-04-30 – 2020-05-01 (×3): 4 [IU] via SUBCUTANEOUS
  Administered 2020-05-01: 3 [IU] via SUBCUTANEOUS
  Administered 2020-05-01: 4 [IU] via SUBCUTANEOUS
  Administered 2020-05-01: 7 [IU] via SUBCUTANEOUS
  Administered 2020-05-01 (×2): 3 [IU] via SUBCUTANEOUS
  Administered 2020-05-02 (×2): 4 [IU] via SUBCUTANEOUS
  Administered 2020-05-02: 3 [IU] via SUBCUTANEOUS
  Administered 2020-05-02: 7 [IU] via SUBCUTANEOUS
  Administered 2020-05-02 (×2): 4 [IU] via SUBCUTANEOUS
  Administered 2020-05-03 (×3): 7 [IU] via SUBCUTANEOUS
  Administered 2020-05-03: 15 [IU] via SUBCUTANEOUS
  Administered 2020-05-03 (×3): 7 [IU] via SUBCUTANEOUS
  Administered 2020-05-04: 4 [IU] via SUBCUTANEOUS
  Administered 2020-05-04: 3 [IU] via SUBCUTANEOUS
  Administered 2020-05-04 (×3): 7 [IU] via SUBCUTANEOUS
  Administered 2020-05-05: 4 [IU] via SUBCUTANEOUS
  Administered 2020-05-05: 7 [IU] via SUBCUTANEOUS
  Administered 2020-05-05: 3 [IU] via SUBCUTANEOUS
  Administered 2020-05-05: 11 [IU] via SUBCUTANEOUS
  Administered 2020-05-05: 4 [IU] via SUBCUTANEOUS
  Administered 2020-05-06 (×2): 3 [IU] via SUBCUTANEOUS
  Administered 2020-05-06: 7 [IU] via SUBCUTANEOUS
  Administered 2020-05-06: 11 [IU] via SUBCUTANEOUS
  Administered 2020-05-06 (×3): 4 [IU] via SUBCUTANEOUS
  Administered 2020-05-07 (×2): 7 [IU] via SUBCUTANEOUS
  Administered 2020-05-07 (×2): 4 [IU] via SUBCUTANEOUS
  Administered 2020-05-08: 7 [IU] via SUBCUTANEOUS
  Administered 2020-05-08: 3 [IU] via SUBCUTANEOUS
  Administered 2020-05-08 (×2): 4 [IU] via SUBCUTANEOUS
  Administered 2020-05-08: 7 [IU] via SUBCUTANEOUS
  Administered 2020-05-09 – 2020-05-11 (×5): 4 [IU] via SUBCUTANEOUS
  Administered 2020-05-11: 7 [IU] via SUBCUTANEOUS
  Administered 2020-05-11: 4 [IU] via SUBCUTANEOUS
  Administered 2020-05-11: 7 [IU] via SUBCUTANEOUS
  Administered 2020-05-11: 4 [IU] via SUBCUTANEOUS
  Administered 2020-05-12 (×2): 7 [IU] via SUBCUTANEOUS
  Administered 2020-05-12: 4 [IU] via SUBCUTANEOUS
  Administered 2020-05-12: 7 [IU] via SUBCUTANEOUS

## 2020-03-28 NOTE — Progress Notes (Signed)
ANTICOAGULATION CONSULT NOTE - Follow Up Consult  Pharmacy Consult for bivalirudin Indication: ECMO and VTE  Labs: Recent Labs    03/26/20 0458 03/26/20 0459 03/27/20 0329 03/27/20 0337 03/27/20 1654 03/27/20 1701 03/27/20 2010 03/28/20 0345 03/28/20 0405  HGB 8.2*   < > 7.9*   < > 9.4*   < > 8.8* 9.2* 9.7*  HCT 25.3*   < > 23.2*   < > 27.6*   < > 26.0* 27.0* 29.2*  PLT 149*   < > 157  --  167  --   --   --  155  APTT 64*   < > 84*  --  68*  --   --   --  67*  LABPROT 20.7*  --  21.3*  --   --   --   --   --   --   INR 1.9*  --  1.9*  --   --   --   --   --   --   CREATININE 1.10   < > 1.03  --  0.98  --   --   --  0.83   < > = values in this interval not displayed.    Assessment: 3 yoM admitted with COVID-19 PNA with worsening hypoxia, s/p cannulation for ECMO. Pt was started on IV heparin prior to cannulation due to acute DVTs and possible PE,  transitioned to bivalirudin with ECMO. Now s/p tPA on 1/5 and tracheostomy on 1/6.   APTT therapeutic at 67 seconds, CBC stable, fibrinogen stable and LDH continues to trend down.  Goal of Therapy:  aPTT 60-80 seconds   Plan:  Continue bivalirudin at 0.17 mg/kg/hr Monitor CBC, 0500/1700 aPTT, LDH, and for s/sx of bleeding   Fredonia Highland, PharmD, BCPS, [facility] Clinical Pharmacist [phone] Please check AMION for all [facility] Pharmacy numbers 03/28/2020

## 2020-03-28 NOTE — Plan of Care (Signed)
  Problem: Clinical Measurements: Goal: Diagnostic test results will improve Outcome: Progressing   Problem: Clinical Measurements: Goal: Respiratory complications will improve Outcome: Progressing   

## 2020-03-28 NOTE — Progress Notes (Signed)
Patient ID: Alex [name], male   DOB: [birth year], 50 y.o.   MRN: [id number]    Advanced Heart Failure Rounding Note  PCP-Cardiologist: No primary care provider on file.   Subjective:    - 12/30: VV ECMO cannulation - 12/31: Left chest tube replaced - 1/2: Extubated. Echo with EF 60-65%, mildly dilated RV with mildly decreased systolic function.  - 1/4: Agitated, suspected aspiration.  Re-intubated.  - 1/5: ECMO cannula repositioned under TEE guidance. TEE showed moderately dilated/moderate-severely dysfunctional RV in setting of hypoxemia. LUE DVT found.  Patient got 1/2 dose of TPA due to initial concern for large PE.  - 1/6: Tracheostomy - 1/7: Echo with mild RV dilation/mild RV dysfunction.   Was agitated and tachypneic overnight. Sweep placed at 4. This am sweep down to 2.5. Remains tachypneic.   CXR with bilateral infiltrates. No change.   ECMO parameters: 3200 rpm Flow 4.3 L/min Pvenous -70 Delta P 25 Sweep 2 -> 11 -> 8 -> 4 -> 2.5   ABG 7.35/53/70/92% (on sweep 4) LDH 410 => 543 => 728 => 1041 => 998 => 1305 =>  => 901 => 1078 => 1,170 => 1,117 => 911 => 751 => 699 Lactate 0.9 PTT 67  On vent 50% TV 300-400s  Objective:   Weight Range: 65.1 kg Body mass index is 24.64 kg/m.   Vital Signs:   Temp:  [97.9 F (36.6 C)-100.22 F (37.9 C)] 98.8 F (37.1 C) (01/16 0400) Pulse Rate:  [93-134] 115 (01/16 0700) Resp:  [16-48] 29 (01/16 0700) BP: (99-197)/(56-106) 142/89 (01/16 0400) SpO2:  [91 %-100 %] 94 % (01/16 0700) Arterial Line BP: (96-306)/(43-111) 209/76 (01/16 0700) FiO2 (%):  [50 %] 50 % (01/16 0322) Weight:  [65.1 kg] 65.1 kg (01/16 0645) Last BM Date: 03/28/19  Weight change: Filed Weights   03/26/20 0500 03/27/20 0645 03/28/20 0645  Weight: 73.5 kg 65.1 kg 65.1 kg    Intake/Output:   Intake/Output Summary (Last 24 hours) at 03/28/2020 0745 Last data filed at 03/28/2020 0700 Gross per 24 hour  Intake 1637.72 ml  Output 3475 ml  Net -1837.28  ml      Physical Exam    General:  Awake on vent. Tachypneic and fatigued HEENT: normal Neck: supple. + RIJ ECMO cannula  + trach Carotids 2+ bilat; no bruits. No lymphadenopathy or thryomegaly appreciated. Cor: PMI nondisplaced. Regular tachycardic. Lungs: tachypneic coarse Abdomen: soft, nontender, nondistended. No hepatosplenomegaly. No bruits or masses. Good bowel sounds. Extremities: no cyanosis, clubbing, rash, edema. arms wrapped  Neuro: alert & orientedx3, cranial nerves grossly intact. moves all 4 extremities w/o difficulty. Affect pleasant   Telemetry   NSR 120s Personally reviewed   Labs    CBC Recent Labs    03/27/20 1654 03/27/20 1701 03/28/20 0345 03/28/20 0405  WBC 12.4*  --   --  11.7*  HGB 9.4*   < > 9.2* 9.7*  HCT 27.6*   < > 27.0* 29.2*  MCV 91.7  --   --  92.7  PLT 167  --   --  155   < > = values in this interval not displayed.   Basic Metabolic Panel Recent Labs    03/27/20 0329 03/27/20 0337 03/27/20 1654 03/27/20 1701 03/28/20 0345 03/28/20 0405  NA 134*   < > 134*   < > 138 136  K 3.6   < > 4.3   < > 3.5 3.6  CL 98  --  98  --   --  99  CO2 26  --  25  --   --  27  GLUCOSE 148*  --  306*  --   --  290*  BUN 23*  --  28*  --   --  31*  CREATININE 1.03  --  0.98  --   --  0.83  CALCIUM 9.4  --  8.8*  --   --  8.9  MG 2.4  --   --   --   --  2.4  PHOS 2.3*  --   --   --   --  1.9*   < > = values in this interval not displayed.   Liver Function Tests Recent Labs    03/27/20 0329 03/28/20 0405  AST 46* 37  ALT 35 36  ALKPHOS 130* 131*  BILITOT 2.1* 1.6*  PROT 5.8* 6.0*  ALBUMIN 3.7 3.7  3.7   No results for input(s): LIPASE, AMYLASE in the last 72 hours. Cardiac Enzymes No results for input(s): CKTOTAL, CKMB, CKMBINDEX, TROPONINI in the last 72 hours.  BNP: BNP (last 3 results) No results for input(s): BNP in the last 8760 hours.  ProBNP (last 3 results) No results for input(s): PROBNP in the last 8760  hours.   D-Dimer No results for input(s): DDIMER in the last 72 hours. Hemoglobin A1C No results for input(s): HGBA1C in the last 72 hours. Fasting Lipid Panel No results for input(s): CHOL, HDL, LDLCALC, TRIG, CHOLHDL, LDLDIRECT in the last 72 hours. Thyroid Function Tests No results for input(s): TSH, T4TOTAL, T3FREE, THYROIDAB in the last 72 hours.  Invalid input(s): FREET3  Other results:   Imaging    No results found.   Medications:     Scheduled Medications: . sodium chloride   Intravenous Once  . barium  200 mL Oral Once  . chlorhexidine gluconate (MEDLINE KIT)  15 mL Mouth Rinse BID  . Chlorhexidine Gluconate Cloth  6 each Topical Daily  . clonazePAM  1 mg Per Tube Q6H  . cloNIDine  0.2 mg Per Tube Q8H  . docusate  100 mg Per Tube BID  . guaiFENesin-dextromethorphan  10 mL Per Tube BID  . insulin aspart  0-15 Units Subcutaneous Q4H  . insulin detemir  15 Units Subcutaneous BID  . living well with diabetes book   Does not apply Once  . mouth rinse  15 mL Mouth Rinse 10 times per day  . metoCLOPramide (REGLAN) injection  10 mg Intravenous Q6H  . metoprolol tartrate  25 mg Per Tube BID  . neostigmine  0.25 mg Subcutaneous Q6H  . oxyCODONE  20 mg Per Tube Q6H  . pantoprazole sodium  40 mg Per Tube QHS  . polyethylene glycol  17 g Per Tube BID  . predniSONE  20 mg Per Tube Q breakfast   Followed by  . [START ON 03/29/2020] predniSONE  10 mg Per Tube Q breakfast  . QUEtiapine  100 mg Per Tube TID  . sennosides  10 mL Per Tube BID  . sodium chloride flush  10-40 mL Intracatheter Q12H    Infusions: . sodium chloride    . sodium chloride Stopped (03/23/20 0951)  . sodium chloride Stopped (03/26/20 0829)  . sodium chloride Stopped (03/12/20 0131)  . albumin human Stopped (03/27/20 1536)  . albumin human 12.5 g (03/27/20 1540)  . bivalirudin (ANGIOMAX) infusion 0.5 mg/mL (Non-ACS indications) 0.17 mg/kg/hr (03/28/20 0700)  . feeding supplement (PIVOT 1.5 CAL)  50 mL/hr at 03/28/20 0700  .  fentaNYL infusion INTRAVENOUS Stopped (03/25/20 1549)  . potassium PHOSPHATE IVPB (in mmol) 20 mmol (03/28/20 0733)    PRN Medications: Place/Maintain arterial line **AND** sodium chloride, sodium chloride, sodium chloride, acetaminophen (TYLENOL) oral liquid 160 mg/5 mL, albumin human, albumin human, chlorpheniramine-HYDROcodone, dextrose, fentaNYL (SUBLIMAZE) injection, guaiFENesin, haloperidol lactate, hydrALAZINE, labetalol, lip balm, LORazepam, ondansetron (ZOFRAN) IV, oxyCODONE, sodium chloride flush   Assessment/Plan   1. Acute hypoxemic respiratory failure: Due to COVID-19 PNA with bilateral infiltrates.  Refractory hypoxemia, VV-ECMO cannulation on 03/03/2020 with improvement in oxygenation.  Developed left PTX post-subclavian CVL and has left chest tube, the left lung is re-expanded.  He was extubated 1/2 but reintubated 1/4 with agitation and suspected aspiration.  Tracheostomy 1/6.  CXR with bilateral infiltrates, improved today.  He is now on cefepime for empiric abx coverage.  ECMO cannula repositioned 1/5.  LDH continues to come down today - Patient has had remdesivir, tocilizumab. - Ongoing steroids with prednisone.  - ECMO circuit functioning appropriately despite high LDH. Several clots in oxygenator. Post oxygenator gas ok on 1/12. LDH coming down again today - Continue bivalirudin, goal PTT 60-80. He is at 25 today Discussed dosing with PharmD personally. - Off cefepime 1(stopped 1/12) -> Afebrile.  Recultured 1/12. NGTD. Follow closely.  - Weaning sweep again but now very tachypneic and fatigued. Initially unclear if tachypnea due to agitation/pain or reactive to keep pH normalized. Sweep turned up to 8 and tachypnea much improved so suspect related to physiologic dead space. Will rest him today and not wean sweep. Continue lasix 40IV to keep even 2. RLE DVT/LUE DVT/thrombus in RV/suspect PE: Echo with moderately dilated and moderately dysfunctional  RV.  Clot noted on TEE in RV as well.  TTE 1/2 showed normal EF 60-65%, RV improved (mildly dilated/dysfunctional). TEE on 1/5 with moderate to severe RV dysfunction but patient was hypoxemic.  Had 1/2 dose TPA on 1/5. Echo 1/7 with mildly dilated/mildly dysfunctional RV.  - Bivalirudin for goal PTT 60-80.  Management as above 3. Left PTX: Left chest tube, lung is re-expanded.  Having ongoing pain at site. Will clamp tube for several hours and check CXR. May be able to remove.  4. Shock: Suspect septic/distributive.  Now resolved, off NE.  5. Anemia: got 1uRBCs overnight. hgb 7.9 -> 9.7 transfuse < 8.   6. AKI: Resolved.  7. Hyperglycemia: insulin.  8. HTN: Following cuff pressure (whip in ABG). BP remains very labile but improving some Continue to adjust meds as needed 9. CHB: Episode of CHB when hypoxemic.  NSR since then.  - With elevated HR, will continue  metoprolol 25 mg bid but follow closely.  10. Thrombocytopenia: Resolved  11. Ileus: TFs restarted. Remains at trickle feeds - Getting Reglan and neostigmine.  - Continue Relastor and sorbitol - Cor-trak repositioned to post-pyloric placement 12. Ischemic digits - UEs wrapped. Will follow. 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  Critical care was necessary to treat or prevent imminent or life-threatening deterioration.  Critical care was time spent personally by me on the following activities: development of treatment plan with patient and/or surrogate as well as nursing, discussions with consultants, evaluation of patient's response to treatment, examination of patient, obtaining history from patient or surrogate, ordering and performing treatments and interventions, ordering and review of laboratory studies, ordering and review of radiographic studies,  pulse oximetry and re-evaluation of patient's condition.    Length of Stay: [place], MD  03/28/2020, 7:45 AM  Advanced Heart Failure Team Pager [phone] (M-F; 7a - 4p)  Please contact [facility] for night-coverage after hours (4p -7a ) and weekends on [email or web address]

## 2020-03-28 NOTE — Progress Notes (Signed)
ANTICOAGULATION CONSULT NOTE - Follow Up Consult  Pharmacy Consult for bivalirudin Indication: ECMO and VTE  Labs: Recent Labs    03/26/20 0458 03/26/20 0459 03/27/20 0329 03/27/20 0337 03/27/20 1654 03/27/20 1701 03/28/20 0405 03/28/20 0904 03/28/20 1620 03/28/20 1635  HGB 8.2*   < > 7.9*   < > 9.4*   < > 9.7* 9.9* 10.2* 9.9*  HCT 25.3*   < > 23.2*   < > 27.6*   < > 29.2* 29.0* 30.0* 30.8*  PLT 149*   < > 157  --  167  --  155  --   --  184  APTT 64*   < > 84*  --  68*  --  67*  --   --  68*  LABPROT 20.7*  --  21.3*  --   --   --   --   --   --   --   INR 1.9*  --  1.9*  --   --   --   --   --   --   --   CREATININE 1.10   < > 1.03  --  0.98  --  0.83  --   --  0.82   < > = values in this interval not displayed.    Assessment: 27 yoM admitted with COVID-19 PNA with worsening hypoxia, s/p cannulation for ECMO. Pt was started on IV heparin prior to cannulation due to acute DVTs and possible PE,  transitioned to bivalirudin with ECMO. Now s/p tPA on 1/5 and tracheostomy on 1/6.   APTT therapeutic at 68 seconds, CBC stable, fibrinogen stable 400s, and LDH 1000>700 continues to trend down.  Goal of Therapy:  aPTT 60-80 seconds   Plan:  Continue bivalirudin at 0.17 mg/kg/hr Monitor CBC, 0500/1700 aPTT, LDH, and for s/sx of bleeding    [name] Pharm.D. CPP, BCPS Clinical Pharmacist [phone] 03/28/2020 5:40 PM    Please check AMION for all [facility] Pharmacy numbers 03/28/2020

## 2020-03-28 NOTE — Procedures (Signed)
Extracorporeal support note  ECLS cannulation date: 12/30 Last circuit change: n/a  Indication: Severe respiratory failure secondary to COVID-19 pneumonia with RV dysfunction  Configuration: Venovenous  Drainage cannula: 32 French crescent cannula via right IJ Return cannula: Same  Pump speed: 3200 RPM Pump flow: 4.3 L/min Pump used: Cardio help  Oxygenator: Cardio help O2 blender: 100% Sweep gas: 4L  Circuit check: clots at corners of oxygenator L>R, stable Anticoagulant: Bivalirudin Anticoagulation targets: PTT 60-80  Changes in support: keep trying to wean sweep  Anticipated goals/duration of support: Bridge to recovery.  Multidisciplinary ECMO rounds completed.   Lorin Glass, MD 03/28/20 7:44 AM [facility]

## 2020-03-28 NOTE — Progress Notes (Addendum)
ECMO PROGRESS NOTE  NAME:  Alex [name], MRN:  [id number], DOB:  1970/03/16, LOS: 19 ADMISSION DATE:  2020/03/16, CONSULTATION DATE: 02/16/2020 REFERRING MD: Wynona Neat -LBPCCM, CHIEF COMPLAINT: Respiratory failure requiring ECMO  HPI/course in hospital  50 year old man admitted to hospital 12/28 with 1 week history of dyspnea cough nausea and vomiting.  Initially admitted to [facility] long hospital and placed on high flow nasal cannula but rapidly failed and required intubation 12/29.  Persistent hypoxic respiratory failure with PF ratio 55 in spite of 18 of PEEP FiO2 0.1 despite paralytics.  Did not improve with prone ventilation  Cannulated for VV ECMO 12/30 via right IJ crescent cannula. Iatrogenic pneumothorax from left subclavian triple-lumen placement  Family confirms no significant past medical history apart from possible prediabetes  Past Medical History  none  Interim history/subjective:  1/13: no great issues overnight. Stopping lasix infusion and dosing daily. Will give dose of acetazolamide. Fever down and normothermic now. precedex off and this has greatly helped his mentation. Also started having bm 1/12: awake communicative just stood multiple times and so is tachypneic and fatigues. Febrile this am to 99.1, turn down heater temp for now, pan cx but pt has been on broad abx with vanc just stopping Monday and cefepime ongoing. Will aos attempt to stop precedex as can contribute and if able to settle out will wean sweep and see. Circuit will likely need change in next few days if unable to come off.  1/11: awake and able to communicate. Able to wean to 2 sweep. Still with good diuresis. Able to speak with in line speaking valve.  1/10: still on escalated sedation. Will increase po to wean continuous, wbc normal. Lactate normal. Will stop vanc (neg pcr)  Objective   Blood pressure (!) 142/89, pulse (!) 115, temperature 98.8 F (37.1 C), temperature source Core, resp. rate (!)  29, height 5\' 4"  (1.626 m), weight 65.1 kg, SpO2 94 %.    Vent Mode: PCV FiO2 (%):  [50 %] 50 % Set Rate:  [20 bmp] 20 bmp PEEP:  [8 cmH20] 8 cmH20   Intake/Output Summary (Last 24 hours) at 03/28/2020 0747 Last data filed at 03/28/2020 0700 Gross per 24 hour  Intake 1637.72 ml  Output 3475 ml  Net -1837.28 ml   Filed Weights   03/26/20 0500 03/27/20 0645 03/28/20 0645  Weight: 73.5 kg 65.1 kg 65.1 kg    ECMO Device: Cardiohelp  ECMO Mode: VV  Flow (LPM): 4.32   Examination: Constitutional: mildly tachypneic man in NAD  Eyes: EOMI, pupils equal Ears, nose, mouth, and throat: trach in place, small thick secretions Cardiovascular: RRR, ext warm Respiratory: Diminished with rhonci at bases, tachypneic Gastrointestinal: Soft, +BS Skin: No rashes, normal turgor Neurologic: moves all 4 ext to command Psychiatric: RASS 0   Net -1.8L yesterday, -8.9 L for admission CXR stable, chest tube in place Cr stable Liver function improved Lactic acid benign LDH 1100>>911>>700>>600s Plts 149>>149>>157 Hgb 9.1>>8.2>>7.9>>1 unit>>9.7 WBC 14>>10>>12>>10>>12>>12 No fevers Off abx CBG up   Assessment & Plan:  Acute hypoxemic and hypercarbic respiratory failure secondary to severe COVID ARDS, likely PE, and RV dysfunction s/p VV ECMO. Need for sedation with mechanical ventilation - Continue VV support. Monitor usual parameters - Clamp chest tube, 2PM CXR, remove if lung still up to reduce chest wall pain and pleural irritation - Daily CXR - For WOB, tested increasing sweep: markedly improved BP and RR, will let him rest a bit, dead space (VTE+ARDS) still  too high for aggressive sweep wean  Intermittent hypertension and likely flash pulmonary edema- seems related to sedation, need to be careful with sweep and sedation changes - Continue metoprolol - Maintain euvolemia  Gastroparesis with some element ileus- improved - Continue neostigmine, reglan - postpyloric TF  Ischemic  changes L hand- due to pressors and LUE DVT impeding blood flow, stable, monitor, L radial line as been removed  DM2 with hyperglycemia- increase levemir  Muscular deconditioning- appreciate PT help up to chair daily, may have to increase sweep for this  Best practice:  Diet: TF Pain/Anxiety/Delirium protocol (if indicated): see above VAP protocol (if indicated): in place DVT prophylaxis: bival gtt GI prophylaxis: PPI Glucose control: SSI Mobility: up with PT as tolerated Code Status: full Family Communication: per ecmo team Disposition: ICU   Patient critically ill due to COVID ARDS Interventions to address this today vent titration, sedation titration, ECMO titration Risk of deterioration without these interventions is high  I personally spent 37 minutes providing critical care not including any separately billable procedures  Myrla Halsted MD [facility] 03/28/2020 7:47 AM Personal pager: [phone] If unanswered, please page CCM On-call: #[phone]

## 2020-03-29 ENCOUNTER — Inpatient Hospital Stay (HOSPITAL_COMMUNITY): Payer: Medicaid Other

## 2020-03-29 DIAGNOSIS — M87046 Idiopathic aseptic necrosis of unspecified finger(s): Secondary | ICD-10-CM

## 2020-03-29 LAB — BPAM RBC
Blood Product Expiration Date: 202201212359
Blood Product Expiration Date: 202201232359
Blood Product Expiration Date: 202201252359
Blood Product Expiration Date: 202201252359
Blood Product Expiration Date: 202201292359
ISSUE DATE / TIME: 202201150431
Unit Type and Rh: 7300
Unit Type and Rh: 7300
Unit Type and Rh: 7300
Unit Type and Rh: 7300
Unit Type and Rh: 7300

## 2020-03-29 LAB — BASIC METABOLIC PANEL
Anion gap: 10 (ref 5–15)
Anion gap: 9 (ref 5–15)
BUN: 39 mg/dL — ABNORMAL HIGH (ref 6–20)
BUN: 41 mg/dL — ABNORMAL HIGH (ref 6–20)
CO2: 28 mmol/L (ref 22–32)
CO2: 32 mmol/L (ref 22–32)
Calcium: 9.2 mg/dL (ref 8.9–10.3)
Calcium: 9.2 mg/dL (ref 8.9–10.3)
Chloride: 105 mmol/L (ref 98–111)
Chloride: 104 mmol/L (ref 98–111)
Creatinine, Ser: 0.76 mg/dL (ref 0.61–1.24)
Creatinine, Ser: 0.76 mg/dL (ref 0.61–1.24)
GFR, Estimated: >60 mL/min (ref >60)
GFR, Estimated: >60 mL/min (ref >60)
Glucose, Bld: 183 mg/dL — ABNORMAL HIGH (ref 70–99)
Glucose, Bld: 196 mg/dL — ABNORMAL HIGH (ref 70–99)
Potassium: 4.1 mmol/L (ref 3.5–5.1)
Potassium: 4.3 mmol/L (ref 3.5–5.1)
Sodium: 143 mmol/L (ref 135–145)
Sodium: 145 mmol/L (ref 135–145)

## 2020-03-29 LAB — CBC
HCT: 30.3 % — ABNORMAL LOW (ref 39.0–52.0)
HCT: 31.1 % — ABNORMAL LOW (ref 39.0–52.0)
Hemoglobin: 9.6 g/dL — ABNORMAL LOW (ref 13.0–17.0)
Hemoglobin: 9.9 g/dL — ABNORMAL LOW (ref 13.0–17.0)
MCH: 29.9 pg (ref 26.0–34.0)
MCH: 30.5 pg (ref 26.0–34.0)
MCHC: 31.7 g/dL (ref 30.0–36.0)
MCHC: 31.8 g/dL (ref 30.0–36.0)
MCV: 94.4 fL (ref 80.0–100.0)
MCV: 95.7 fL (ref 80.0–100.0)
Platelets: 171 10*3/uL (ref 150–400)
Platelets: 191 10*3/uL (ref 150–400)
RBC: 3.21 MIL/uL — ABNORMAL LOW (ref 4.22–5.81)
RBC: 3.25 MIL/uL — ABNORMAL LOW (ref 4.22–5.81)
RDW: 17.6 % — ABNORMAL HIGH (ref 11.5–15.5)
RDW: 18.3 % — ABNORMAL HIGH (ref 11.5–15.5)
WBC: 9.6 10*3/uL (ref 4.0–10.5)
WBC: 10.2 10*3/uL (ref 4.0–10.5)
nRBC: 0.2 % (ref 0.0–0.2)
nRBC: 0.3 % — ABNORMAL HIGH (ref 0.0–0.2)

## 2020-03-29 LAB — POCT I-STAT 7, (LYTES, BLD GAS, ICA,H+H)
Acid-Base Excess: 6 mmol/L — ABNORMAL HIGH (ref 0.0–2.0)
Acid-Base Excess: 6 mmol/L — ABNORMAL HIGH (ref 0.0–2.0)
Acid-Base Excess: 6 mmol/L — ABNORMAL HIGH (ref 0.0–2.0)
Acid-Base Excess: 8 mmol/L — ABNORMAL HIGH (ref 0.0–2.0)
Acid-Base Excess: 11 mmol/L — ABNORMAL HIGH (ref 0.0–2.0)
Bicarbonate: 31.4 mmol/L — ABNORMAL HIGH (ref 20.0–28.0)
Bicarbonate: 32.2 mmol/L — ABNORMAL HIGH (ref 20.0–28.0)
Bicarbonate: 33.7 mmol/L — ABNORMAL HIGH (ref 20.0–28.0)
Bicarbonate: 34.7 mmol/L — ABNORMAL HIGH (ref 20.0–28.0)
Bicarbonate: 37.1 mmol/L — ABNORMAL HIGH (ref 20.0–28.0)
Calcium, Ion: 1.3 mmol/L (ref 1.15–1.40)
Calcium, Ion: 1.3 mmol/L (ref 1.15–1.40)
Calcium, Ion: 1.28 mmol/L (ref 1.15–1.40)
Calcium, Ion: 1.25 mmol/L (ref 1.15–1.40)
Calcium, Ion: 1.26 mmol/L (ref 1.15–1.40)
HCT: 27 % — ABNORMAL LOW (ref 39.0–52.0)
HCT: 29 % — ABNORMAL LOW (ref 39.0–52.0)
HCT: 31 % — ABNORMAL LOW (ref 39.0–52.0)
HCT: 30 % — ABNORMAL LOW (ref 39.0–52.0)
HCT: 29 % — ABNORMAL LOW (ref 39.0–52.0)
Hemoglobin: 9.2 g/dL — ABNORMAL LOW (ref 13.0–17.0)
Hemoglobin: 9.9 g/dL — ABNORMAL LOW (ref 13.0–17.0)
Hemoglobin: 10.5 g/dL — ABNORMAL LOW (ref 13.0–17.0)
Hemoglobin: 10.2 g/dL — ABNORMAL LOW (ref 13.0–17.0)
Hemoglobin: 9.9 g/dL — ABNORMAL LOW (ref 13.0–17.0)
O2 Saturation: 92 %
O2 Saturation: 94 %
O2 Saturation: 94 %
O2 Saturation: 93 %
O2 Saturation: 94 %
Patient temperature: 36.7
Patient temperature: 36.5
Patient temperature: 36.5
Patient temperature: 36.3
Patient temperature: 98.9
Potassium: 4 mmol/L (ref 3.5–5.1)
Potassium: 4.2 mmol/L (ref 3.5–5.1)
Potassium: 4.4 mmol/L (ref 3.5–5.1)
Potassium: 4.1 mmol/L (ref 3.5–5.1)
Potassium: 3.9 mmol/L (ref 3.5–5.1)
Sodium: 144 mmol/L (ref 135–145)
Sodium: 144 mmol/L (ref 135–145)
Sodium: 146 mmol/L — ABNORMAL HIGH (ref 135–145)
Sodium: 145 mmol/L (ref 135–145)
Sodium: 147 mmol/L — ABNORMAL HIGH (ref 135–145)
TCO2: 33 mmol/L — ABNORMAL HIGH (ref 22–32)
TCO2: 34 mmol/L — ABNORMAL HIGH (ref 22–32)
TCO2: 36 mmol/L — ABNORMAL HIGH (ref 22–32)
TCO2: 36 mmol/L — ABNORMAL HIGH (ref 22–32)
TCO2: 39 mmol/L — ABNORMAL HIGH (ref 22–32)
pCO2 arterial: 49.4 mmHg — ABNORMAL HIGH (ref 32.0–48.0)
pCO2 arterial: 50.7 mmHg — ABNORMAL HIGH (ref 32.0–48.0)
pCO2 arterial: 61.4 mmHg — ABNORMAL HIGH (ref 32.0–48.0)
pCO2 arterial: 57.1 mmHg — ABNORMAL HIGH (ref 32.0–48.0)
pCO2 arterial: 59 mmHg — ABNORMAL HIGH (ref 32.0–48.0)
pH, Arterial: 7.41 (ref 7.350–7.450)
pH, Arterial: 7.409 (ref 7.350–7.450)
pH, Arterial: 7.345 — ABNORMAL LOW (ref 7.350–7.450)
pH, Arterial: 7.388 (ref 7.350–7.450)
pH, Arterial: 7.407 (ref 7.350–7.450)
pO2, Arterial: 64 mmHg — ABNORMAL LOW (ref 83.0–108.0)
pO2, Arterial: 71 mmHg — ABNORMAL LOW (ref 83.0–108.0)
pO2, Arterial: 74 mmHg — ABNORMAL LOW (ref 83.0–108.0)
pO2, Arterial: 68 mmHg — ABNORMAL LOW (ref 83.0–108.0)
pO2, Arterial: 73 mmHg — ABNORMAL LOW (ref 83.0–108.0)

## 2020-03-29 LAB — TYPE AND SCREEN
ABO/RH(D): B POS
Antibody Screen: NEGATIVE
Unit division: 0
Unit division: 0
Unit division: 0
Unit division: 0
Unit division: 0

## 2020-03-29 LAB — APTT
aPTT: 65 seconds — ABNORMAL HIGH (ref 24–36)
aPTT: 66 seconds — ABNORMAL HIGH (ref 24–36)

## 2020-03-29 LAB — LACTIC ACID, PLASMA
Lactic Acid, Venous: 1.4 mmol/L (ref 0.5–1.9)
Lactic Acid, Venous: 1.6 mmol/L (ref 0.5–1.9)

## 2020-03-29 LAB — HEPATIC FUNCTION PANEL
ALT: 41 U/L (ref 0–44)
AST: 34 U/L (ref 15–41)
Albumin: 3.5 g/dL (ref 3.5–5.0)
Alkaline Phosphatase: 141 U/L — ABNORMAL HIGH (ref 38–126)
Bilirubin, Direct: 0.3 mg/dL — ABNORMAL HIGH (ref 0.0–0.2)
Indirect Bilirubin: 0.9 mg/dL (ref 0.3–0.9)
Total Bilirubin: 1.2 mg/dL (ref 0.3–1.2)
Total Protein: 5.9 g/dL — ABNORMAL LOW (ref 6.5–8.1)

## 2020-03-29 LAB — GLUCOSE, CAPILLARY
Glucose-Capillary: 164 mg/dL — ABNORMAL HIGH (ref 70–99)
Glucose-Capillary: 254 mg/dL — ABNORMAL HIGH (ref 70–99)
Glucose-Capillary: 265 mg/dL — ABNORMAL HIGH (ref 70–99)
Glucose-Capillary: 182 mg/dL — ABNORMAL HIGH (ref 70–99)
Glucose-Capillary: 140 mg/dL — ABNORMAL HIGH (ref 70–99)

## 2020-03-29 LAB — CULTURE, BLOOD (ROUTINE X 2)
Culture: NO GROWTH
Culture: NO GROWTH

## 2020-03-29 LAB — PROTIME-INR
INR: 1.6 — ABNORMAL HIGH (ref 0.8–1.2)
Prothrombin Time: 18.3 seconds — ABNORMAL HIGH (ref 11.4–15.2)

## 2020-03-29 LAB — FIBRINOGEN: Fibrinogen: 474 mg/dL (ref 210–475)

## 2020-03-29 LAB — LACTATE DEHYDROGENASE: LDH: 691 U/L — ABNORMAL HIGH (ref 98–192)

## 2020-03-29 MED ORDER — FUROSEMIDE 10 MG/ML IJ SOLN
40.0000 mg | Freq: Once | INTRAMUSCULAR | Status: AC
  Administered 2020-03-29: 40 mg via INTRAVENOUS
  Filled 2020-03-29: qty 4

## 2020-03-29 MED ORDER — METOLAZONE 2.5 MG PO TABS
2.5000 mg | ORAL_TABLET | Freq: Once | ORAL | Status: AC
  Administered 2020-03-29: 2.5 mg via ORAL
  Filled 2020-03-29: qty 1

## 2020-03-29 MED ORDER — EPINEPHRINE 1 MG/10ML IJ SOSY
PREFILLED_SYRINGE | INTRAMUSCULAR | Status: AC
  Filled 2020-03-29: qty 10

## 2020-03-29 MED ORDER — FREE WATER
200.0000 mL | Freq: Four times a day (QID) | Status: DC
  Administered 2020-03-29 – 2020-04-02 (×16): 200 mL

## 2020-03-29 MED ORDER — MORPHINE (PF) INJECTION FOR INHALATION 10 MG/ML
10.0000 mg | RESPIRATORY_TRACT | Status: DC | PRN
  Administered 2020-03-29 – 2020-04-08 (×11): 10 mg via RESPIRATORY_TRACT
  Filled 2020-03-29 (×11): qty 1

## 2020-03-29 MED ORDER — HYDROMORPHONE HCL 1 MG/ML IJ SOLN
0.5000 mg | INTRAMUSCULAR | Status: DC | PRN
  Administered 2020-03-29: 1 mg via INTRAVENOUS
  Administered 2020-03-29 – 2020-03-30 (×2): 2 mg via INTRAVENOUS
  Administered 2020-03-30: 1 mg via INTRAVENOUS
  Administered 2020-03-30: 2 mg via INTRAVENOUS
  Administered 2020-03-30 (×2): 1 mg via INTRAVENOUS
  Administered 2020-03-31 – 2020-04-01 (×3): 2 mg via INTRAVENOUS
  Administered 2020-04-02: 1 mg via INTRAVENOUS
  Administered 2020-04-02 – 2020-04-06 (×12): 2 mg via INTRAVENOUS
  Administered 2020-04-06: 1 mg via INTRAVENOUS
  Administered 2020-04-06 – 2020-04-07 (×5): 2 mg via INTRAVENOUS
  Filled 2020-03-29 (×3): qty 2
  Filled 2020-03-29: qty 1
  Filled 2020-03-29: qty 2
  Filled 2020-03-29: qty 1
  Filled 2020-03-29 (×4): qty 2
  Filled 2020-03-29: qty 1
  Filled 2020-03-29 (×5): qty 2
  Filled 2020-03-29: qty 1
  Filled 2020-03-29 (×10): qty 2
  Filled 2020-03-29: qty 1
  Filled 2020-03-29 (×3): qty 2

## 2020-03-29 MED ORDER — INSULIN ASPART 100 UNIT/ML ~~LOC~~ SOLN
10.0000 [IU] | SUBCUTANEOUS | Status: DC
  Administered 2020-03-29 – 2020-03-31 (×10): 10 [IU] via SUBCUTANEOUS

## 2020-03-29 NOTE — Progress Notes (Signed)
Assisted tele visit to patient with family member.  Ephrem Carrick M, RN  

## 2020-03-29 NOTE — Procedures (Signed)
Extracorporeal support note  ECLS cannulation date: 12/30 Last circuit change: n/a  Indication: Severe respiratory failure secondary to COVID-19 pneumonia with RV dysfunction  Configuration: Venovenous  Drainage cannula: 32 French crescent cannula via right IJ Return cannula: Same  Pump speed: 3300 RPM Pump flow: 4.35 L/min Pump used: Cardio help  Oxygenator: Cardio help O2 blender: 100% Sweep gas: 6L  Circuit check: clots at corners of oxygenator L>R, stable Anticoagulant: Bivalirudin Anticoagulation targets: PTT 60-80  Changes in support: kept a higher sweep 1/16 to allow rest, definitely still has high dead space  Anticipated goals/duration of support: Bridge to recovery.  Multidisciplinary ECMO rounds completed.   Lorin Glass, MD 03/29/20 10:05 AM [facility]

## 2020-03-29 NOTE — Progress Notes (Signed)
ANTICOAGULATION CONSULT NOTE - Follow Up Consult  Pharmacy Consult for bivalirudin Indication: ECMO and VTE  Labs: Recent Labs    03/27/20 0329 03/27/20 0337 03/28/20 0405 03/28/20 0904 03/28/20 1635 03/28/20 1940 03/29/20 0300 03/29/20 0800 03/29/20 1159  HGB 7.9*   < > 9.7*   < > 9.9*   < > 9.6*  9.2* 9.9* 10.5*  HCT 23.2*   < > 29.2*   < > 30.8*   < > 30.3*  27.0* 29.0* 31.0*  PLT 157   < > 155  --  184  --  171  --   --   APTT 84*   < > 67*  --  68*  --  65*  --   --   LABPROT 21.3*  --   --   --   --   --  18.3*  --   --   INR 1.9*  --   --   --   --   --  1.6*  --   --   CREATININE 1.03   < > 0.83  --  0.82  --  0.76  --   --    < > = values in this interval not displayed.    Assessment: 84 yoM admitted with COVID-19 PNA with worsening hypoxia, s/p cannulation for ECMO. Pt was started on IV heparin prior to cannulation due to acute DVTs and possible PE,  transitioned to bivalirudin with ECMO. Now s/p tPA on 1/5 and tracheostomy on 1/6.   APTT therapeutic at 65 seconds, CBC stable, fibrinogen stable 400s, and LDH 1000>691 continues to trend down.  Goal of Therapy:  aPTT 60-80 seconds   Plan:  Continue bivalirudin at 0.17 mg/kg/hr Monitor CBC, 0500/1700 aPTT, LDH, and for s/sx of bleeding    Tad Moore, BCPS, BCCP Clinical Pharmacist  03/29/2020 1:38 PM   [facility] pharmacy phone numbers are listed on [email or web address]    Please check AMION for all [facility] Pharmacy numbers 03/29/2020

## 2020-03-29 NOTE — Progress Notes (Signed)
VASCULAR LAB    Left upper extremity arterial duplex has been performed.  See CV proc for preliminary results.   Samah Lapiana, RVT 03/29/2020, 10:43 AM

## 2020-03-29 NOTE — Progress Notes (Signed)
ANTICOAGULATION CONSULT NOTE - Follow Up Consult  Pharmacy Consult for bivalirudin Indication: ECMO and VTE  Labs: Recent Labs    03/27/20 0329 03/27/20 0337 03/28/20 1635 03/28/20 1940 03/29/20 0300 03/29/20 0800 03/29/20 1159 03/29/20 1549 03/29/20 1557  HGB 7.9*   < > 9.9*   < > 9.6*  9.2*   < > 10.5* 9.9* 10.2*  HCT 23.2*   < > 30.8*   < > 30.3*  27.0*   < > 31.0* 31.1* 30.0*  PLT 157   < > 184  --  171  --   --  191  --   APTT 84*   < > 68*  --  65*  --   --  66*  --   LABPROT 21.3*  --   --   --  18.3*  --   --   --   --   INR 1.9*  --   --   --  1.6*  --   --   --   --   CREATININE 1.03   < > 0.82  --  0.76  --   --  0.76  --    < > = values in this interval not displayed.    Assessment: 61 yoM admitted with COVID-19 PNA with worsening hypoxia, s/p cannulation for ECMO. Pt was started on IV heparin prior to cannulation due to acute DVTs and possible PE,  transitioned to bivalirudin with ECMO. Now s/p tPA on 1/5 and tracheostomy on 1/6.   APTT therapeutic at 66 seconds, CBC stable  Goal of Therapy:  aPTT 60-80 seconds   Plan:  Continue bivalirudin at 0.17 mg/kg/hr Monitor CBC, 0500/1700 aPTT, LDH, and for s/sx of bleeding    Jeanella Cara, PharmD, [facility] Clinical Pharmacist Please see AMION for all Pharmacists' Contact Phone Numbers 03/29/2020, 5:38 PM     Please check AMION for all [facility] Pharmacy numbers 03/29/2020

## 2020-03-29 NOTE — Progress Notes (Signed)
This chaplain was updated on the Pt. progress by the healthcare team.   The chaplain introduced herself to the Pt. and offered F/U spiritual care. The Pt. shared a nod of agreement and closed his eyes.    This chaplain will continue spiritual care as indicated by the Pt. and Pt. son-Josh.

## 2020-03-29 NOTE — Progress Notes (Signed)
RT assisted speech with in-line passy muir valve trial.  Patient's cuff was deflated and appropriate ventilator changes were made to allow for phonation.  PRN morphine given prior to trial to help with work of breathing.  Patient tolerated well with no complications.

## 2020-03-29 NOTE — Progress Notes (Signed)
  Speech Language Pathology Treatment: Alex [name] Speaking valve  Patient Details Name: Alex [name] MRN: [id number] DOB: Feb 24, 1971 Today's Date: 03/29/2020 Time: 9833-8250 SLP Time Calculation (min) (ACUTE ONLY): 22 min  Assessment / Plan / Recommendation Clinical Impression  Pt was seen for inline PMV trial, coordinating tx with RT and ECMO RN. Pt's son, Alex [name], was also able to video chat into the session. Pt was upright in his chair today for more optimal positioning, but was also quite lethargic. He did just receive some inhaled morphine , but per RN he has been drowsier over the last several days and not necessarily medication induced. She did say that his tachypnea appeared to be improved since administration. At baseline pt's TV was around 400, dropping significantly upon gradual cuff deflation. PEEP was reduced from 8 to 5, trigger sensitivity was adjusted to 5, and RR was dropped from 20 to 12. Pt needed more consistent cueing today to maintain his alertness and to take deep breaths to improve volume, which did increase his intelligibility. He was also perseverative today, often mumbling and repeating what he had just been prompted to say. PMV was left in place for approximately 15 minutes before pt verbalized that he was tired. His longest utterance was at the end of the session when he repeated to SLP, "I need a break." PMV was doffed, cuff reinflated, and vent returned to baseline settings. Will continue to follow and progress as able.    HPI HPI: 50 year old man admitted to hospital 12/28 with 1 week history of dyspnea cough nausea and vomiting.  Initially admitted to [facility] long hospital and placed on high flow nasal cannula but rapidly failed and required intubation 12/29.  Persistent hypoxic respiratory failure with PF ratio 55 in spite of 18 of PEEP FiO2 0.1 despite paralytics.  Did not improve with prone ventilation. Cannulated for VV ECMO 12/30 via right IJ crescent cannula.  Iatrogenic pneumothorax from left subclavian triple-lumen placement. Trach on 1/7.      SLP Plan  Continue with current plan of care       Recommendations         Patient may use Passy-Muir Speech Valve: with SLP only PMSV Supervision: Full         Oral Care Recommendations: Oral care QID Follow up Recommendations: Inpatient Rehab SLP Visit Diagnosis: Aphonia (R49.1) Plan: Continue with current plan of care       GO                Alex [name]., M.A. CCC-SLP Acute Rehabilitation Services Pager [phone] Office [phone]  03/29/2020, 1:54 PM

## 2020-03-29 NOTE — Progress Notes (Signed)
Physical Therapy Treatment Patient Details Name: Alex [name] MRN: [id number] DOB: 1971-02-18 Today's Date: 03/29/2020    History of Present Illness 50 y.o. male  with no significant past medical history admitted on 16-Mar-2020 with dyspnea, cough, nausea/vomiting ~ 1 week ago with worsening symptoms of body aches and fatigue and +COVID 02/07/20 and admitted with shortness of breath. Required intubation 03/10/20. Cannulated for VV ECMO 02/16/2020. Oxygenating better with ECMO. Also evidence of RLE DVT, RV thrombus, and high suspicion of PE. Has required chest tube to L lung for collapse which needs further reposition with recurrent collapse. Extubated 03-15-19.  Reintubated 1/5 and trach placed 1/6.    PT Comments    Pt admitted with above diagnosis. Pt needing incr assist for all aspects of mobility today with pt unable to sit eOB without incr max assist.  Pt mod to max to stand and could not sustain upright posture for sitting or standing for long periods of time thus needing incr +2 to 3 support.  Pt struggling to breathe as well.  VSS. Nurse and Ecmo specialist in room.  Will continue to follow acutely.  Pt currently with functional limitations due to balance and endurance deficits. Pt will benefit from skilled PT to increase their independence and safety with mobility to allow discharge to the venue listed below.    Follow Up Recommendations  CIR;Supervision/Assistance - 24 hour     Equipment Recommendations  Other (comment) (TBA)    Recommendations for Other Services       Precautions / Restrictions Precautions Precautions: Fall Precaution Comments: ECMO, Vent, rectal tube, Foley Restrictions Weight Bearing Restrictions: No    Mobility  Bed Mobility Overal bed mobility: Needs Assistance Bed Mobility: Supine to Sit Rolling: Mod assist;+2 for physical assistance;Max assist (extra 2 persons for ECMO and other lines) Sidelying to sit: Mod assist;Max assist;+2 for physical assistance  (extra 2 persons)       General bed mobility comments: modA to max assist  for helicopter transfer to EoB, pt needs help to scoot to EOB with mod to max assist for this today as pt having difficulty sitting upright  Transfers Overall transfer level: Needs assistance   Transfers: Sit to/from Stand Sit to Stand: +2 physical assistance;From elevated surface;Mod assist;+2 safety/equipment;Max assist         General transfer comment: pt very eager to get out of bed and nods understanding to use of Stedy. pt was mod to max assist of 2 for sit>stand to [facility] as pt lethargic today and with decr postural stabiity, vc for upright posture for power up. As pt fatigues, he can progress to mod to max assist for standing and mod max to sit on Stedy with care to watch lines.  Goal was to move pt to chair today.    Positioned pt in chair.  Pt very fatigued today and less interactive.  Ambulation/Gait             General Gait Details: TBA   Stairs             Wheelchair Mobility    Modified Rankin (Stroke Patients Only)       Balance Overall balance assessment: Needs assistance Sitting-balance support: Feet supported;Bilateral upper extremity supported Sitting balance-Leahy Scale: Poor Sitting balance - Comments: pt able to sit in [place]  with mod to max A for steadying, increased RR however maintains SaO2 on 50% FiO2 on vent with trach.  Needed mod to max to sit EOB as pt pushes posteriorly at  times.   Standing balance support: Bilateral upper extremity supported;During functional activity Standing balance-Leahy Scale: Poor Standing balance comment: relies on UE support for balance as well as 2 person mod to max assist                            Cognition Arousal/Alertness: Lethargic Behavior During Therapy: Flat affect Overall Cognitive Status: Within Functional Limits for tasks assessed                                        Exercises General  Exercises - Lower Extremity Ankle Circles/Pumps: PROM;Both;5 reps;Supine Quad Sets: AROM;Both;10 reps;Supine Gluteal Sets: AROM;Both;10 reps;Supine Long Arc Quad: AROM;Both;5 reps;Seated    General Comments General comments (skin integrity, edema, etc.): FiO2 50% and PEEP8 with VSS      Pertinent Vitals/Pain Pain Assessment: Faces Faces Pain Scale: No hurt Pain Intervention(s): Limited activity within patient's tolerance;Monitored during session;Repositioned;Premedicated before session    Home Living                      Prior Function            PT Goals (current goals can now be found in the care plan section) Acute Rehab PT Goals Patient Stated Goal: unable to state Progress towards PT goals: Progressing toward goals    Frequency    Min 3X/week      PT Plan Current plan remains appropriate    Co-evaluation              AM-PAC PT "6 Clicks" Mobility   Outcome Measure  Help needed turning from your back to your side while in a flat bed without using bedrails?: A Lot Help needed moving from lying on your back to sitting on the side of a flat bed without using bedrails?: A Lot Help needed moving to and from a bed to a chair (including a wheelchair)?: A Lot Help needed standing up from a chair using your arms (e.g., wheelchair or bedside chair)?: A Lot Help needed to walk in hospital room?: Total Help needed climbing 3-5 steps with a railing? : Total 6 Click Score: 10    End of Session Equipment Utilized During Treatment: Other (comment) (vent with trach) Activity Tolerance: Patient limited by fatigue Patient left: with call bell/phone within reach;with nursing/sitter in room;in chair Nurse Communication: Mobility status;Need for lift equipment PT Visit Diagnosis: Muscle weakness (generalized) (M62.81);Pain Pain - part of body:  (chest)     Time: 7867-6720 PT Time Calculation (min) (ACUTE ONLY): 24 min  Charges:  $Therapeutic Exercise: 8-22  mins $Therapeutic Activity: 8-22 mins                     Fredric Slabach W,PT Acute Rehabilitation Services Pager:  [phone]  Office:  [phone]     Berline Lopes 03/29/2020, 1:24 PM

## 2020-03-29 NOTE — Progress Notes (Signed)
ECMO PROGRESS NOTE  NAME:  Alex [name], MRN:  [id number], DOB:  February 17, 1971, LOS: 20 ADMISSION DATE:  02/14/2020, CONSULTATION DATE: 27-Mar-2020 REFERRING MD: Wynona Neat -LBPCCM, CHIEF COMPLAINT: Respiratory failure requiring ECMO  HPI/course in hospital  50 year old man admitted to hospital 12/28 with 1 week history of dyspnea cough nausea and vomiting.  Initially admitted to [facility] long hospital and placed on high flow nasal cannula but rapidly failed and required intubation 12/29.  Persistent hypoxic respiratory failure with PF ratio 55 in spite of 18 of PEEP FiO2 0.1 despite paralytics.  Did not improve with prone ventilation  Cannulated for VV ECMO 12/30 via right IJ crescent cannula. Iatrogenic pneumothorax from left subclavian triple-lumen placement  Family confirms no significant past medical history apart from possible prediabetes  Past Medical History  none  Interim history/subjective:  More comfortable overnight. Still having coughing fits despite current Rx. Having a lot of pain in his left hand.  Objective   Blood pressure (!) 161/99, pulse (!) 108, temperature 98.42 F (36.9 C), resp. rate (!) 30, height 5\' 4"  (1.626 m), weight 65.2 kg, SpO2 97 %.    Vent Mode: PCV FiO2 (%):  [50 %] 50 % Set Rate:  [20 bmp] 20 bmp PEEP:  [8 cmH20] 8 cmH20   Intake/Output Summary (Last 24 hours) at 03/29/2020 1006 Last data filed at 03/29/2020 1000 Gross per 24 hour  Intake 2623.7 ml  Output 2655 ml  Net -31.3 ml   Filed Weights   03/27/20 0645 03/28/20 0645 03/29/20 0500  Weight: 65.1 kg 65.1 kg 65.2 kg    ECMO Device: Cardiohelp  ECMO Mode: VV  Flow (LPM): 4.35   Examination: Constitutional: no acute distress lying in bed  Eyes: EOMI, pupils equal Ears, nose, mouth, and throat: trach with small bloody secretions, MMM Cardiovascular: tachycardic, ext warm Respiratory: scattered rhonci, triggering vent Gastrointestinal: soft, +BS Skin: No rashes, normal turgor, chest  tube site of left with some serous drainage Neurologic: moves all 4 ext to command Ext: L hand wrapped, stable ischemic changes to hand per nursing Psychiatric: RASS 0   Net +1 L yesterday, -7.7 L for admission CXR stable ARDS, chest tube out Cr stable Liver function improved Lactic acid benign LDH 1100>>911>>700>>600s Plts 149>>149>>157 Hgb 9.1>>8.2>>7.9>>1 unit>>9.7 WBC 14>>10>>12>>10>>12>>12 No fevers Off abx CBG a bit better   Assessment & Plan:  Acute hypoxemic and hypercarbic respiratory failure secondary to severe COVID ARDS, likely PE, and RV dysfunction s/p VV ECMO. Need for sedation with mechanical ventilation Refractory coughing - Continue VV support. Monitor usual parameters - Daily CXR - Another sweep wean trial today paying close attention to WOB - Trial of nebulized morphine for cough - Try to limit suctioning  Intermittent hypertension and likely flash pulmonary edema- seems related to sedation, need to be careful with sweep and sedation changes - Continue metoprolol - Maintain euvolemia, another dose of lasix today, add small dose metolazone and FWF to reduce hypernatremia  Gastroparesis with some element ileus- improved - Continue reglan - postpyloric TF  Ischemic changes L hand- due to pressors and LUE DVT impeding blood flow, stable, monitor, L radial line as been removed  DM2 with hyperglycemia- levemir and basal-bolus as ordered  Muscular deconditioning- appreciate PT help up to chair daily, may have to increase sweep for this  Best practice:  Diet: TF Pain/Anxiety/Delirium protocol (if indicated): see above VAP protocol (if indicated): in place DVT prophylaxis: bival gtt GI prophylaxis: PPI Glucose control: SSI Mobility: up  with PT as tolerated Code Status: full Family Communication: per ecmo team Disposition: ICU   Patient critically ill due to COVID ARDS Interventions to address this today vent titration, sedation titration, ECMO  titration Risk of deterioration without these interventions is high  I personally spent 34 minutes providing critical care not including any separately billable procedures  Myrla Halsted MD [facility] 03/29/2020 10:06 AM Personal pager: [phone] If unanswered, please page CCM On-call: #[phone]

## 2020-03-29 NOTE — Progress Notes (Signed)
Advanced Heart Failure Rounding Note  PCP-Cardiologist: No primary care provider on file.   Subjective:    - 12/30: VV ECMO cannulation - 12/31: Left chest tube replaced - 1/2: Extubated. Echo with EF 60-65%, mildly dilated RV with mildly decreased systolic function.  - 1/4: Agitated, suspected aspiration.  Re-intubated.  - 1/5: ECMO cannula repositioned under TEE guidance. TEE showed moderately dilated/moderate-severely dysfunctional RV in setting of hypoxemia. LUE DVT found.  Patient got 1/2 dose of TPA due to initial concern for large PE.  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  - 1/6: Tracheostomy - 1/7: Echo with mild RV dilation/mild RV dysfunction.  - 1/16: Left chest tube out  Stable overnight, sweep at 6.  Less pain with chest tube out.    CXR with bilateral infiltrates. Left lung remains expanded with CT out.   ECMO parameters: 3300 rpm Flow 4.37 L/min Pvenous -69 Delta P 28 Sweep 6   ABG 7.41/49/64/92% LDH 410 => 543 => 728 => 1041 => 998 => 1305 =>  => 901 => 1078 => 1,170 => 1,117 => 911 => 751 => 699 => 691 Lactate 1.4 PTT 65  Objective:   Weight Range: 65.2 kg Body mass index is 24.67 kg/m.   Vital Signs:   Temp:  [97.7 F (36.5 C)-98.6 F (37 C)] 97.88 F (36.6 C) (01/17 0700) Pulse Rate:  [101-131] 108 (01/17 0700) Resp:  [17-42] 20 (01/17 0700) BP: (100-193)/(62-109) 111/77 (01/17 0700) SpO2:  [91 %-98 %] 96 % (01/17 0700) Arterial Line BP: (132-205)/(55-79) 165/70 (01/17 0700) FiO2 (%):  [50 %] 50 % (01/17 0400) Weight:  [65.2 kg] 65.2 kg (01/17 0500) Last BM Date: 03/28/20  Weight change: Filed Weights   03/27/20 0645 03/28/20 0645 03/29/20 0500  Weight: 65.1 kg 65.1 kg 65.2 kg    Intake/Output:   Intake/Output Summary (Last 24 hours) at 03/29/2020 0742 Last data filed at 03/29/2020 0700 Gross per 24 hour  Intake 3349.73 ml  Output 2300 ml  Net 1049.73 ml      Physical Exam    General: NAD Neck:  Tracheostomy. No JVD, no thyromegaly or thyroid nodule.  Lungs: Crackles/rhonchi bilaterally.  CV: Nondisplaced PMI.  Heart mildly tachy, regular S1/S2, no S3/S4, no murmur.  No peripheral edema.   Abdomen: Soft, nontender, no hepatosplenomegaly, no distention.  Skin: Intact without lesions or rashes.  Neurologic: Alert, follows commands. Extremities: LUE digital tips gangrenous.  HEENT: Normal.    Telemetry   NSR 110s Personally reviewed   Labs    CBC Recent Labs    03/28/20 1635 03/28/20 1940 03/29/20 0300  WBC 13.1*  --  9.6  HGB 9.9* 9.5* 9.6*  9.2*  HCT 30.8* 28.0* 30.3*  27.0*  MCV 93.1  --  94.4  PLT 184  --  263   Basic Metabolic Panel Recent Labs    03/27/20 0329 03/27/20 0337 03/28/20 0405 03/28/20 0904 03/28/20 1635 03/28/20 1940 03/29/20 0300  NA 134*   < > 136   < > 139 142 143  144  K 3.6   < > 3.6   < > 4.8 4.5 4.1  4.0  CL 98   < > 99  --  103  --  105  CO2 26   < > 27  --  26  --  28  GLUCOSE 148*   < > 290*  --  421*  --  183*  BUN 23*   < > 31*  --  36*  --  39*  CREATININE 1.03   < > 0.83  --  0.82  --  0.76  CALCIUM 9.4   < > 8.9  --  9.1  --  9.2  MG 2.4  --  2.4  --   --   --   --   PHOS 2.3*  --  1.9*  --   --   --   --    < > = values in this interval not displayed.   Liver Function Tests Recent Labs    03/28/20 0405 03/29/20 0300  AST 37 34  ALT 36 41  ALKPHOS 131* 141*  BILITOT 1.6* 1.2  PROT 6.0* 5.9*  ALBUMIN 3.7  3.7 3.5   No results for input(s): LIPASE, AMYLASE in the last 72 hours. Cardiac Enzymes No results for input(s): CKTOTAL, CKMB, CKMBINDEX, TROPONINI in the last 72 hours.  BNP: BNP (last 3 results) No results for input(s): BNP in the last 8760 hours.  ProBNP (last 3 results) No results for input(s): PROBNP in the last 8760 hours.   D-Dimer No results for input(s): DDIMER in the last 72 hours. Hemoglobin A1C No results for input(s): HGBA1C in the last 72 hours. Fasting Lipid Panel No results  for input(s): CHOL, HDL, LDLCALC, TRIG, CHOLHDL, LDLDIRECT in the last 72 hours. Thyroid Function Tests No results for input(s): TSH, T4TOTAL, T3FREE, THYROIDAB in the last 72 hours.  Invalid input(s): FREET3  Other results:   Imaging    DG CHEST PORT 1 VIEW  Result Date: 03/29/2020 CLINICAL DATA:  50 year old male COVID-56.  ARDS.  ECMO. EXAM: PORTABLE CHEST 1 VIEW COMPARISON:  Portable chest 03/28/2020 and earlier. FINDINGS: Portable AP semi upright view at 0542 hours. Stable tracheostomy tube, left IJ approach central line, visible feeding tube, right ECMO cannula. Stable lung volumes and mediastinal contours. Perihilar air bronchograms greater on the left. Coarse and indistinct pulmonary opacity elsewhere. Ventilation remains stable, modestly improved since 03/24/2020. IMPRESSION: 1. Stable lines and tubes. 2. Diffuse pneumonia/ARDS with continued stable ventilation. Electronically Signed   By: Genevie Ann M.D.   On: 03/29/2020 05:53   DG CHEST PORT 1 VIEW  Result Date: 03/28/2020 CLINICAL DATA:  Respiratory complications. EXAM: PORTABLE CHEST 1 VIEW COMPARISON:  03/28/2020 at 6:39 a.m. FINDINGS: Tracheostomy tube and left IJ central venous catheter unchanged. Enteric tube courses into the stomach and off the film as tip is not visualized. ECMO apparatus and left-sided chest tube unchanged. Lungs are adequately inflated with stable bilateral patchy hazy airspace opacification likely multifocal infection. No effusion. Cardiomediastinal silhouette and remainder of the exam is unchanged IMPRESSION: 1. Stable bilateral patchy hazy airspace process likely multifocal infection. 2. Tubes and lines as described. Electronically Signed   By: Marin Olp M.D.   On: 03/28/2020 14:05     Medications:     Scheduled Medications: . barium  200 mL Oral Once  . chlorhexidine gluconate (MEDLINE KIT)  15 mL Mouth Rinse BID  . Chlorhexidine Gluconate Cloth  6 each Topical Daily  . clonazePAM  1 mg Per Tube  Q6H  . cloNIDine  0.2 mg Per Tube Q8H  . docusate  100 mg Per Tube BID  . furosemide  40 mg Intravenous Once  . guaiFENesin-dextromethorphan  10 mL Per Tube BID  . insulin aspart  0-20 Units Subcutaneous Q4H  . insulin aspart  6 Units Subcutaneous Q4H  . insulin detemir  30 Units Subcutaneous BID  . living well with diabetes book  Does not apply Once  . mouth rinse  15 mL Mouth Rinse 10 times per day  . metoCLOPramide (REGLAN) injection  10 mg Intravenous Q6H  . metoprolol tartrate  25 mg Per Tube BID  . oxyCODONE  20 mg Per Tube Q6H  . pantoprazole sodium  40 mg Per Tube QHS  . polyethylene glycol  17 g Per Tube BID  . predniSONE  10 mg Per Tube Q breakfast  . QUEtiapine  100 mg Per Tube TID  . sennosides  10 mL Per Tube BID  . sodium chloride flush  10-40 mL Intracatheter Q12H    Infusions: . sodium chloride    . sodium chloride Stopped (03/23/20 0951)  . sodium chloride Stopped (03/26/20 0829)  . sodium chloride Stopped (03/12/20 0131)  . albumin human Stopped (03/27/20 1536)  . bivalirudin (ANGIOMAX) infusion 0.5 mg/mL (Non-ACS indications) 0.17 mg/kg/hr (03/29/20 0700)  . feeding supplement (PIVOT 1.5 CAL) 1,000 mL (03/28/20 2230)  . fentaNYL infusion INTRAVENOUS Stopped (03/25/20 1549)    PRN Medications: Place/Maintain arterial line **AND** sodium chloride, sodium chloride, sodium chloride, acetaminophen (TYLENOL) oral liquid 160 mg/5 mL, albumin human, chlorpheniramine-HYDROcodone, guaiFENesin, haloperidol lactate, hydrALAZINE, HYDROmorphone (DILAUDID) injection, labetalol, lip balm, LORazepam, morphine, ondansetron (ZOFRAN) IV, oxyCODONE, sodium chloride flush   Assessment/Plan   1. Acute hypoxemic respiratory failure: Due to COVID-19 PNA with bilateral infiltrates.  Refractory hypoxemia, VV-ECMO cannulation on 03/03/2020 with improvement in oxygenation.  Developed left PTX post-subclavian CVL and had left chest tube, the left lung is re-expanded and CT out.  He was  extubated 1/2 but reintubated 1/4 with agitation and suspected aspiration.  Tracheostomy 1/6.  CXR with bilateral infiltrates. ECMO cannula repositioned 1/5.  LDH continues to come down today. Still requiring sweep 6, likely related to dead space. He is now off abx. Pain control better with CT out. - Patient has had remdesivir, tocilizumab. - Ongoing steroids with prednisone.  - ECMO circuit functioning appropriately despite high LDH and now trending down. Several clots in oxygenator chronically. - Continue bivalirudin, goal PTT 65-80. He is at 70 today. Discussed dosing with PharmD personally. - Slow wean of sweep. - Lasix 40 mg IV x 1 today.  2. RLE DVT/LUE DVT/thrombus in RV/suspect PE: Echo with moderately dilated and moderately dysfunctional RV.  Clot noted on TEE in RV as well.  TTE 1/2 showed normal EF 60-65%, RV improved (mildly dilated/dysfunctional). TEE on 1/5 with moderate to severe RV dysfunction but patient was hypoxemic.  Had 1/2 dose TPA on 1/5. Echo 1/7 with mildly dilated/mildly dysfunctional RV.  - Bivalirudin for goal PTT 65-80.  Management as above 3. Left PTX: Left chest tube, lung is re-expanded. Tube now out, stable CXR. 4. Shock: Suspect septic/distributive.  Now resolved, off NE.  5. Anemia: Hgb 9.6. transfuse < 8.   6. AKI: Resolved.  7. Hyperglycemia: insulin.  8. 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  - On clonidine.  - Increase metoprolol to 50 mg bid.  9. CHB: Episode of CHB when hypoxemic.  NSR since then.  - With elevated HR, will increase metoprolol to 50 mg bid but follow closely.  10. Thrombocytopenia: Resolved  11. Ileus: Resolved. TFs restarted.  - Getting Reglan  - Cor-trak repositioned to post-pyloric placement 12. Ischemic digits: LUE.  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  Will repeat study, if progressive will involve vascular.    CRITICAL CARE Performed by: Loralie Champagne  Total critical  care time:  45  minutes  Critical care time was exclusive of separately billable procedures and treating other patients.  Critical care was necessary to treat or prevent imminent or life-threatening deterioration.  Critical care was time spent personally by me on the following activities: development of treatment plan with patient and/or surrogate as well as nursing, discussions with consultants, evaluation of patient's response to treatment, examination of patient, obtaining history from patient or surrogate, ordering and performing treatments and interventions, ordering and review of laboratory studies, ordering and review of radiographic studies, pulse oximetry and re-evaluation of patient's condition.    Length of Stay: [place]el [place], MD  03/29/2020, 7:42 AM  Advanced Heart Failure Team Pager [phone] (M-F; 7a - 4p)  Please contact [facility] for night-coverage after hours (4p -7a ) and weekends on [email or web address]

## 2020-03-30 ENCOUNTER — Inpatient Hospital Stay (HOSPITAL_COMMUNITY): Payer: Medicaid Other

## 2020-03-30 LAB — POCT I-STAT 7, (LYTES, BLD GAS, ICA,H+H)
Acid-Base Excess: 12 mmol/L — ABNORMAL HIGH (ref 0.0–2.0)
Acid-Base Excess: 13 mmol/L — ABNORMAL HIGH (ref 0.0–2.0)
Acid-Base Excess: 14 mmol/L — ABNORMAL HIGH (ref 0.0–2.0)
Acid-Base Excess: 16 mmol/L — ABNORMAL HIGH (ref 0.0–2.0)
Acid-Base Excess: 16 mmol/L — ABNORMAL HIGH (ref 0.0–2.0)
Acid-Base Excess: 17 mmol/L — ABNORMAL HIGH (ref 0.0–2.0)
Acid-Base Excess: 19 mmol/L — ABNORMAL HIGH (ref 0.0–2.0)
Acid-Base Excess: 21 mmol/L — ABNORMAL HIGH (ref 0.0–2.0)
Bicarbonate: 38.5 mmol/L — ABNORMAL HIGH (ref 20.0–28.0)
Bicarbonate: 38.7 mmol/L — ABNORMAL HIGH (ref 20.0–28.0)
Bicarbonate: 41.4 mmol/L — ABNORMAL HIGH (ref 20.0–28.0)
Bicarbonate: 41.6 mmol/L — ABNORMAL HIGH (ref 20.0–28.0)
Bicarbonate: 43.2 mmol/L — ABNORMAL HIGH (ref 20.0–28.0)
Bicarbonate: 43.4 mmol/L — ABNORMAL HIGH (ref 20.0–28.0)
Bicarbonate: 45.2 mmol/L — ABNORMAL HIGH (ref 20.0–28.0)
Bicarbonate: 47.2 mmol/L — ABNORMAL HIGH (ref 20.0–28.0)
Calcium, Ion: 1.23 mmol/L (ref 1.15–1.40)
Calcium, Ion: 1.23 mmol/L (ref 1.15–1.40)
Calcium, Ion: 1.25 mmol/L (ref 1.15–1.40)
Calcium, Ion: 1.25 mmol/L (ref 1.15–1.40)
Calcium, Ion: 1.26 mmol/L (ref 1.15–1.40)
Calcium, Ion: 1.26 mmol/L (ref 1.15–1.40)
Calcium, Ion: 1.28 mmol/L (ref 1.15–1.40)
Calcium, Ion: 1.29 mmol/L (ref 1.15–1.40)
HCT: 25 % — ABNORMAL LOW (ref 39.0–52.0)
HCT: 27 % — ABNORMAL LOW (ref 39.0–52.0)
HCT: 28 % — ABNORMAL LOW (ref 39.0–52.0)
HCT: 28 % — ABNORMAL LOW (ref 39.0–52.0)
HCT: 28 % — ABNORMAL LOW (ref 39.0–52.0)
HCT: 29 % — ABNORMAL LOW (ref 39.0–52.0)
HCT: 29 % — ABNORMAL LOW (ref 39.0–52.0)
HCT: 36 % — ABNORMAL LOW (ref 39.0–52.0)
Hemoglobin: 12.2 g/dL — ABNORMAL LOW (ref 13.0–17.0)
Hemoglobin: 8.5 g/dL — ABNORMAL LOW (ref 13.0–17.0)
Hemoglobin: 9.2 g/dL — ABNORMAL LOW (ref 13.0–17.0)
Hemoglobin: 9.5 g/dL — ABNORMAL LOW (ref 13.0–17.0)
Hemoglobin: 9.5 g/dL — ABNORMAL LOW (ref 13.0–17.0)
Hemoglobin: 9.5 g/dL — ABNORMAL LOW (ref 13.0–17.0)
Hemoglobin: 9.9 g/dL — ABNORMAL LOW (ref 13.0–17.0)
Hemoglobin: 9.9 g/dL — ABNORMAL LOW (ref 13.0–17.0)
O2 Saturation: 100 %
O2 Saturation: 75 %
O2 Saturation: 93 %
O2 Saturation: 95 %
O2 Saturation: 98 %
O2 Saturation: 98 %
O2 Saturation: 99 %
O2 Saturation: 99 %
Patient temperature: 37.1
Patient temperature: 37.1
Patient temperature: 97.6
Patient temperature: 98.5
Patient temperature: 98.8
Potassium: 3.3 mmol/L — ABNORMAL LOW (ref 3.5–5.1)
Potassium: 3.4 mmol/L — ABNORMAL LOW (ref 3.5–5.1)
Potassium: 3.6 mmol/L (ref 3.5–5.1)
Potassium: 3.7 mmol/L (ref 3.5–5.1)
Potassium: 3.7 mmol/L (ref 3.5–5.1)
Potassium: 3.8 mmol/L (ref 3.5–5.1)
Potassium: 3.9 mmol/L (ref 3.5–5.1)
Potassium: 4.2 mmol/L (ref 3.5–5.1)
Sodium: 144 mmol/L (ref 135–145)
Sodium: 144 mmol/L (ref 135–145)
Sodium: 145 mmol/L (ref 135–145)
Sodium: 145 mmol/L (ref 135–145)
Sodium: 146 mmol/L — ABNORMAL HIGH (ref 135–145)
Sodium: 146 mmol/L — ABNORMAL HIGH (ref 135–145)
Sodium: 147 mmol/L — ABNORMAL HIGH (ref 135–145)
Sodium: 147 mmol/L — ABNORMAL HIGH (ref 135–145)
TCO2: 40 mmol/L — ABNORMAL HIGH (ref 22–32)
TCO2: 40 mmol/L — ABNORMAL HIGH (ref 22–32)
TCO2: 43 mmol/L — ABNORMAL HIGH (ref 22–32)
TCO2: 44 mmol/L — ABNORMAL HIGH (ref 22–32)
TCO2: 45 mmol/L — ABNORMAL HIGH (ref 22–32)
TCO2: 45 mmol/L — ABNORMAL HIGH (ref 22–32)
TCO2: 47 mmol/L — ABNORMAL HIGH (ref 22–32)
TCO2: 49 mmol/L — ABNORMAL HIGH (ref 22–32)
pCO2 arterial: 54.7 mmHg — ABNORMAL HIGH (ref 32.0–48.0)
pCO2 arterial: 58.1 mmHg — ABNORMAL HIGH (ref 32.0–48.0)
pCO2 arterial: 59 mmHg — ABNORMAL HIGH (ref 32.0–48.0)
pCO2 arterial: 61.4 mmHg — ABNORMAL HIGH (ref 32.0–48.0)
pCO2 arterial: 64.1 mmHg — ABNORMAL HIGH (ref 32.0–48.0)
pCO2 arterial: 66.3 mmHg (ref 32.0–48.0)
pCO2 arterial: 68.5 mmHg (ref 32.0–48.0)
pCO2 arterial: 70.3 mmHg (ref 32.0–48.0)
pH, Arterial: 7.38 (ref 7.350–7.450)
pH, Arterial: 7.41 (ref 7.350–7.450)
pH, Arterial: 7.42 (ref 7.350–7.450)
pH, Arterial: 7.456 — ABNORMAL HIGH (ref 7.350–7.450)
pH, Arterial: 7.457 — ABNORMAL HIGH (ref 7.350–7.450)
pH, Arterial: 7.457 — ABNORMAL HIGH (ref 7.350–7.450)
pH, Arterial: 7.46 — ABNORMAL HIGH (ref 7.350–7.450)
pH, Arterial: 7.461 — ABNORMAL HIGH (ref 7.350–7.450)
pO2, Arterial: 119 mmHg — ABNORMAL HIGH (ref 83.0–108.0)
pO2, Arterial: 125 mmHg — ABNORMAL HIGH (ref 83.0–108.0)
pO2, Arterial: 393 mmHg — ABNORMAL HIGH (ref 83.0–108.0)
pO2, Arterial: 43 mmHg — ABNORMAL LOW (ref 83.0–108.0)
pO2, Arterial: 67 mmHg — ABNORMAL LOW (ref 83.0–108.0)
pO2, Arterial: 75 mmHg — ABNORMAL LOW (ref 83.0–108.0)
pO2, Arterial: 97 mmHg (ref 83.0–108.0)
pO2, Arterial: 98 mmHg (ref 83.0–108.0)

## 2020-03-30 LAB — BASIC METABOLIC PANEL
Anion gap: 12 (ref 5–15)
Anion gap: 11 (ref 5–15)
BUN: 43 mg/dL — ABNORMAL HIGH (ref 6–20)
BUN: 43 mg/dL — ABNORMAL HIGH (ref 6–20)
CO2: 33 mmol/L — ABNORMAL HIGH (ref 22–32)
CO2: 37 mmol/L — ABNORMAL HIGH (ref 22–32)
Calcium: 9.5 mg/dL (ref 8.9–10.3)
Calcium: 9.4 mg/dL (ref 8.9–10.3)
Chloride: 101 mmol/L (ref 98–111)
Chloride: 98 mmol/L (ref 98–111)
Creatinine, Ser: 0.75 mg/dL (ref 0.61–1.24)
Creatinine, Ser: 0.75 mg/dL (ref 0.61–1.24)
GFR, Estimated: >60 mL/min (ref >60)
GFR, Estimated: >60 mL/min (ref >60)
Glucose, Bld: 256 mg/dL — ABNORMAL HIGH (ref 70–99)
Glucose, Bld: 84 mg/dL (ref 70–99)
Potassium: 4.4 mmol/L (ref 3.5–5.1)
Potassium: 3.5 mmol/L (ref 3.5–5.1)
Sodium: 146 mmol/L — ABNORMAL HIGH (ref 135–145)
Sodium: 146 mmol/L — ABNORMAL HIGH (ref 135–145)

## 2020-03-30 LAB — HEPATIC FUNCTION PANEL
ALT: 40 U/L (ref 0–44)
AST: 32 U/L (ref 15–41)
Albumin: 3.7 g/dL (ref 3.5–5.0)
Alkaline Phosphatase: 137 U/L — ABNORMAL HIGH (ref 38–126)
Bilirubin, Direct: 0.3 mg/dL — ABNORMAL HIGH (ref 0.0–0.2)
Indirect Bilirubin: 1 mg/dL — ABNORMAL HIGH (ref 0.3–0.9)
Total Bilirubin: 1.3 mg/dL — ABNORMAL HIGH (ref 0.3–1.2)
Total Protein: 6.3 g/dL — ABNORMAL LOW (ref 6.5–8.1)

## 2020-03-30 LAB — APTT
aPTT: 73 seconds — ABNORMAL HIGH (ref 24–36)
aPTT: 81 seconds — ABNORMAL HIGH (ref 24–36)

## 2020-03-30 LAB — CBC
HCT: 29.7 % — ABNORMAL LOW (ref 39.0–52.0)
HCT: 28.6 % — ABNORMAL LOW (ref 39.0–52.0)
Hemoglobin: 9.8 g/dL — ABNORMAL LOW (ref 13.0–17.0)
Hemoglobin: 8.8 g/dL — ABNORMAL LOW (ref 13.0–17.0)
MCH: 31.5 pg (ref 26.0–34.0)
MCH: 30 pg (ref 26.0–34.0)
MCHC: 33 g/dL (ref 30.0–36.0)
MCHC: 30.8 g/dL (ref 30.0–36.0)
MCV: 95.5 fL (ref 80.0–100.0)
MCV: 97.6 fL (ref 80.0–100.0)
Platelets: 188 10*3/uL (ref 150–400)
Platelets: 178 10*3/uL (ref 150–400)
RBC: 3.11 MIL/uL — ABNORMAL LOW (ref 4.22–5.81)
RBC: 2.93 MIL/uL — ABNORMAL LOW (ref 4.22–5.81)
RDW: 18.6 % — ABNORMAL HIGH (ref 11.5–15.5)
RDW: 18.6 % — ABNORMAL HIGH (ref 11.5–15.5)
WBC: 8.4 10*3/uL (ref 4.0–10.5)
WBC: 4.7 10*3/uL (ref 4.0–10.5)
nRBC: 0.7 % — ABNORMAL HIGH (ref 0.0–0.2)
nRBC: 0.8 % — ABNORMAL HIGH (ref 0.0–0.2)

## 2020-03-30 LAB — EXPECTORATED SPUTUM ASSESSMENT W GRAM STAIN, RFLX TO RESP C

## 2020-03-30 LAB — GLUCOSE, CAPILLARY
Glucose-Capillary: 123 mg/dL — ABNORMAL HIGH (ref 70–99)
Glucose-Capillary: 165 mg/dL — ABNORMAL HIGH (ref 70–99)
Glucose-Capillary: 195 mg/dL — ABNORMAL HIGH (ref 70–99)
Glucose-Capillary: 198 mg/dL — ABNORMAL HIGH (ref 70–99)
Glucose-Capillary: 240 mg/dL — ABNORMAL HIGH (ref 70–99)
Glucose-Capillary: 82 mg/dL (ref 70–99)
Glucose-Capillary: 93 mg/dL (ref 70–99)

## 2020-03-30 LAB — POCT ACTIVATED CLOTTING TIME: Activated Clotting Time: 380 s

## 2020-03-30 LAB — LACTIC ACID, PLASMA
Lactic Acid, Venous: 1.7 mmol/L (ref 0.5–1.9)
Lactic Acid, Venous: 1.1 mmol/L (ref 0.5–1.9)

## 2020-03-30 LAB — PROTIME-INR
INR: 1.6 — ABNORMAL HIGH (ref 0.8–1.2)
Prothrombin Time: 18.9 seconds — ABNORMAL HIGH (ref 11.4–15.2)

## 2020-03-30 LAB — PROCALCITONIN: Procalcitonin: 0.51 ng/mL

## 2020-03-30 LAB — LACTATE DEHYDROGENASE: LDH: 737 U/L — ABNORMAL HIGH (ref 98–192)

## 2020-03-30 LAB — FIBRINOGEN: Fibrinogen: 592 mg/dL — ABNORMAL HIGH (ref 210–475)

## 2020-03-30 MED ORDER — QUETIAPINE FUMARATE 100 MG PO TABS
200.0000 mg | ORAL_TABLET | Freq: Three times a day (TID) | ORAL | Status: DC
  Administered 2020-03-30 – 2020-04-03 (×13): 200 mg
  Filled 2020-03-30 (×13): qty 2

## 2020-03-30 MED ORDER — LIDOCAINE HCL 1 % IJ SOLN
10.0000 mL | INTRAMUSCULAR | Status: DC | PRN
  Administered 2020-03-31 – 2020-04-06 (×13): 10 mL via RESPIRATORY_TRACT
  Filled 2020-03-30 (×18): qty 10

## 2020-03-30 MED ORDER — ALBUMIN HUMAN 25 % IV SOLN
25.0000 g | Freq: Once | INTRAVENOUS | Status: AC
  Administered 2020-03-30: 25 g via INTRAVENOUS
  Filled 2020-03-30: qty 100

## 2020-03-30 MED ORDER — POTASSIUM CHLORIDE 20 MEQ PO PACK
40.0000 meq | PACK | Freq: Once | ORAL | Status: AC
  Administered 2020-03-30: 40 meq
  Filled 2020-03-30: qty 2

## 2020-03-30 MED ORDER — METOPROLOL TARTRATE 50 MG PO TABS
50.0000 mg | ORAL_TABLET | Freq: Two times a day (BID) | ORAL | Status: DC
  Administered 2020-03-30 – 2020-04-01 (×5): 50 mg
  Filled 2020-03-30 (×5): qty 1

## 2020-03-30 MED ORDER — INSULIN DETEMIR 100 UNIT/ML ~~LOC~~ SOLN
35.0000 [IU] | Freq: Two times a day (BID) | SUBCUTANEOUS | Status: DC
  Administered 2020-03-30 – 2020-04-10 (×23): 35 [IU] via SUBCUTANEOUS
  Filled 2020-03-30 (×24): qty 0.35

## 2020-03-30 MED ORDER — FUROSEMIDE 10 MG/ML IJ SOLN
40.0000 mg | Freq: Two times a day (BID) | INTRAMUSCULAR | Status: AC
  Administered 2020-03-30 (×2): 40 mg via INTRAVENOUS
  Filled 2020-03-30 (×2): qty 4

## 2020-03-30 MED ORDER — LORAZEPAM 2 MG/ML IJ SOLN
2.0000 mg | Freq: Once | INTRAMUSCULAR | Status: AC
  Administered 2020-03-30: 2 mg via INTRAVENOUS
  Filled 2020-03-30: qty 1

## 2020-03-30 MED ORDER — METOLAZONE 5 MG PO TABS
5.0000 mg | ORAL_TABLET | Freq: Once | ORAL | Status: AC
  Administered 2020-03-30: 5 mg via ORAL
  Filled 2020-03-30: qty 1

## 2020-03-30 MED ORDER — CHLORHEXIDINE GLUCONATE CLOTH 2 % EX PADS
6.0000 | MEDICATED_PAD | Freq: Every day | CUTANEOUS | Status: DC
  Administered 2020-03-30 – 2020-05-11 (×42): 6 via TOPICAL

## 2020-03-30 MED ORDER — ETOMIDATE 2 MG/ML IV SOLN
10.0000 mg | Freq: Once | INTRAVENOUS | Status: AC
  Administered 2020-03-30: 10 mg via INTRAVENOUS
  Filled 2020-03-30: qty 10

## 2020-03-30 NOTE — Plan of Care (Signed)
?  Problem: Clinical Measurements: ?Goal: Will remain free from infection ?Outcome: Progressing ?  ?

## 2020-03-30 NOTE — Progress Notes (Signed)
ANTICOAGULATION CONSULT NOTE - Follow Up Consult  Pharmacy Consult for bivalirudin Indication: ECMO and VTE  Labs: Recent Labs    03/29/20 0300 03/29/20 0800 03/29/20 1549 03/29/20 1557 03/30/20 0359 03/30/20 0441 03/30/20 1129 03/30/20 1600 03/30/20 1610  HGB 9.6*  9.2*   < > 9.9*   < > 9.8*   < > 9.9* 8.8* 9.2*  HCT 30.3*  27.0*   < > 31.1*   < > 29.7*   < > 29.0* 28.6* 27.0*  PLT 171  --  191  --  188  --   --  178  --   APTT 65*  --  66*  --  73*  --   --  81*  --   LABPROT 18.3*  --   --   --  18.9*  --   --   --   --   INR 1.6*  --   --   --  1.6*  --   --   --   --   CREATININE 0.76  --  0.76  --  0.75  --   --   --   --    < > = values in this interval not displayed.    Assessment: 59 yoM admitted with COVID-19 PNA with worsening hypoxia, s/p cannulation for ECMO. Pt was started on IV heparin prior to cannulation due to acute DVTs and possible PE,  transitioned to bivalirudin with ECMO. Now s/p tPA on 1/5 and tracheostomy on 1/6.   APTT slightly supratherapeutic at 81 seconds, CBC stable.  Will continue same for tonight, but if trends up in am will plan to decrease dose.  Goal of Therapy:  aPTT 60-80 seconds   Plan:  Continue bivalirudin at 0.17 mg/kg/hr Monitor CBC, 0500/1700 aPTT, LDH, and for s/sx of bleeding    Jeanella Cara, PharmD, [facility] Clinical Pharmacist Please see AMION for all Pharmacists' Contact Phone Numbers 03/30/2020, 5:30 PM

## 2020-03-30 NOTE — Procedures (Signed)
Extracorporeal support note  ECLS cannulation date: 12/30 Last circuit change: n/a  Indication: Severe respiratory failure secondary to COVID-19 pneumonia with RV dysfunction  Configuration: Venovenous  Drainage cannula: 32 French crescent cannula via right IJ Return cannula: Same  Pump speed: 3400 RPM Pump flow: 4.57 L/min Pump used: Cardio help  Oxygenator: Cardio help O2 blender: 100% Sweep gas: 4L  Circuit check: clots at corners of oxygenator L>R, stable Anticoagulant: Bivalirudin Anticoagulation targets: PTT 60-80  Changes in support: trials of higher sweep to see if reduces WOB, infectious workup for worsening R lung infiltrates  Anticipated goals/duration of support: Bridge to recovery.  Multidisciplinary ECMO rounds completed.   Lorin Glass, MD 03/30/20 8:00 AM [facility]

## 2020-03-30 NOTE — Progress Notes (Signed)
Elink assisted son Sharia Reeve and brother Kendell Bane with tele visits today.

## 2020-03-30 NOTE — Progress Notes (Signed)
Patient had 2 events of second degree type 2 blocks with rates of about 30s and profound drop in blood pressure related to unprovoked coughing spells. Patient is restless this shift than previous shifts with patient.Work of breathing is increased and he seems to be gasping for air. Volumes on vents are adequate and Oxygen saturation is in the upper 90s

## 2020-03-30 NOTE — Progress Notes (Signed)
ANTICOAGULATION CONSULT NOTE - Follow Up Consult  Pharmacy Consult for bivalirudin Indication: ECMO and VTE  Labs: Recent Labs    03/29/20 0300 03/29/20 0800 03/29/20 1549 03/29/20 1557 03/29/20 1938 03/30/20 0359 03/30/20 0441  HGB 9.6*  9.2*   < > 9.9*   < > 9.9* 9.8* 9.9*  HCT 30.3*  27.0*   < > 31.1*   < > 29.0* 29.7* 29.0*  PLT 171  --  191  --   --  188  --   APTT 65*  --  66*  --   --  73*  --   LABPROT 18.3*  --   --   --   --  18.9*  --   INR 1.6*  --   --   --   --  1.6*  --   CREATININE 0.76  --  0.76  --   --  0.75  --    < > = values in this interval not displayed.    Assessment: 52 yoM admitted with COVID-19 PNA with worsening hypoxia, s/p cannulation for ECMO. Pt was started on IV heparin prior to cannulation due to acute DVTs and possible PE,  transitioned to bivalirudin with ECMO. Now s/p tPA on 1/5 and tracheostomy on 1/6.   APTT therapeutic at 73 seconds, CBC stable  Fibrinogen up 474>592; LDH up 691 > 737  Goal of Therapy:  aPTT 60-80 seconds   Plan:  Continue bivalirudin at 0.17 mg/kg/hr Monitor CBC, 0500/1700 aPTT, LDH, and for s/sx of bleeding    Reece Leader, Colon Flattery, [facility] Clinical Pharmacist  03/30/2020 8:08 AM   [facility] pharmacy phone numbers are listed on [email or web address]

## 2020-03-30 NOTE — Progress Notes (Signed)
ECMO PROGRESS NOTE  NAME:  Trevion Hoben, MRN:  [id number], DOB:  1970/08/16, LOS: 21 ADMISSION DATE:  02/28/2020, CONSULTATION DATE: 2020/03/28 REFERRING MD: Wynona Neat -LBPCCM, CHIEF COMPLAINT: Respiratory failure requiring ECMO  HPI/course in hospital  50 year old man admitted to hospital 12/28 with 1 week history of dyspnea cough nausea and vomiting.  Initially admitted to [facility] and placed on high flow nasal cannula but rapidly failed and required intubation 12/29.  Persistent hypoxic respiratory failure with PF ratio 55 in spite of 18 of PEEP FiO2 0.1 despite paralytics.  Did not improve with prone ventilation  Cannulated for VV ECMO 12/30 via right IJ crescent cannula. Iatrogenic pneumothorax from left subclavian triple-lumen placement  Family confirms no significant past medical history apart from possible prediabetes  Past Medical History  none  Interim history/subjective:  Increased WOB overnight. Still having coughing fits and occasional vagal episodes with bradycardia.  Objective   Blood pressure 128/73, pulse (!) 122, temperature 98.3 F (36.8 C), resp. rate (!) 32, height 5\' 4"  (1.626 m), weight 64.9 kg, SpO2 97 %.    Vent Mode: PCV FiO2 (%):  [50 %] 50 % Set Rate:  [20 bmp] 20 bmp PEEP:  [8 cmH20] 8 cmH20 Plateau Pressure:  [26 cmH20] 26 cmH20   Intake/Output Summary (Last 24 hours) at 03/30/2020 0802 Last data filed at 03/30/2020 0600 Gross per 24 hour  Intake 2823.8 ml  Output 2835 ml  Net -11.2 ml   Filed Weights   03/28/20 0645 03/29/20 0500 03/30/20 0400  Weight: 65.1 kg 65.2 kg 64.9 kg    ECMO Device: Cardiohelp  ECMO Mode: VV  Flow (LPM): 4.57   Examination: Constitutional: mild resp distress  Eyes: EOMI, pupils equal Ears, nose, mouth, and throat: trach in place, small thick bloody secretions Cardiovascular: Tachycardic, ext warm Respiratory: abdominal breathing pattern, +accessory muscle use, crackles bases Gastrointestinal:  soft, +BS Skin: No rashes, normal turgor Neurologic: moves all 4 ext, globally weak Psychiatric: RASS 0  Net even yesterday Sodium up slightly LDH creeping up, fibrinogen as well   Assessment & Plan:  Acute hypoxemic and hypercarbic respiratory failure secondary to severe COVID ARDS, likely PE, and RV dysfunction s/p VV ECMO. Need for sedation with mechanical ventilation Refractory coughing - Continue VV support. Monitor usual parameters - Try to get a lateral CXR to look at trach positioning, he may benefit from a distal XLT - May need to consider circuit change soon if LDH/fibrinogen keep rising - Generous with sweep for now while we figure out reason for his resp distress: pain vs. Agitation vs. Dead space - Check tracheal aspirate, Pct given worsening airspace disease on R  HTN - Increase metoprolol today - Pay attention to other causes of HTN: pain/resp distress/anxiety agitation  Gastroparesis with some element ileus- improved - Continue reglan - postpyloric TF  Ischemic changes L hand- due to pressors and LUE DVT impeding blood flow, stable, monitor, L radial line as been removed; repeat LUE arterial duplex improved  DM2 with hyperglycemia- levemir and basal-bolus as ordered, levemir increase today  Muscular deconditioning- appreciate PT help up to chair daily, may have to increase sweep for this  Best practice:  Diet: TF Pain/Anxiety/Delirium protocol (if indicated): see above VAP protocol (if indicated): in place DVT prophylaxis: bival gtt GI prophylaxis: PPI Glucose control: SSI Mobility: up with PT as tolerated Code Status: full Family Communication: per ecmo team Disposition: ICU   Patient critically ill due to COVID ARDS Interventions to  address this today vent titration, sedation titration, ECMO titration Risk of deterioration without these interventions is high  I personally spent 36 minutes providing critical care not including any separately  billable procedures  Myrla Halsted MD [facility] 03/30/2020 8:02 AM Personal pager: [phone] If unanswered, please page CCM On-call: #[phone]

## 2020-03-30 NOTE — Progress Notes (Signed)
Sputum collected and sent to lab 

## 2020-03-30 NOTE — Progress Notes (Signed)
SLP Cancellation Note  Patient Details Name: Alex [name] MRN: [id number] DOB: Apr 21, 1970   Cancelled treatment:       Reason Eval/Treat Not Completed: Other (comment) Tried coordinating a time for inline PMV with RN assisting with communication with RT. Pt also had a trach change today (now with #6 cuffed XLT), and per RN, pt needed sedation for this. Will continue to see pt as much as possible.    Mahala Menghini., M.A. CCC-SLP Acute Rehabilitation Services Pager [phone] Office [phone]  03/30/2020, 3:48 PM

## 2020-03-30 NOTE — Procedures (Signed)
Tracheostomy Change Note  Patient Details:   Name: Alex [name] DOB: [birth year] MRN: [id number]    Airway Documentation:     Evaluation  O2 sats: stable throughout Complications: No apparent complications Patient did tolerate procedure well. Bilateral Breath Sounds: Diminished   Pts trach changed to #6 XLT per MD order. Pt tolerated procedure well. ETCO2 detector positive for color change. Trach secured.    Guss Bunde 03/30/2020, 11:13 AM

## 2020-03-30 NOTE — Procedures (Signed)
Bronchoscopy Procedure Note  Osmani Kersten  [id number]  27-Apr-1970  Date:03/30/20  Time:11:09 AM   Provider Performing:Ayaan Ringle C Tamala Julian   Procedure(s):  Flexible bronchoscopy with bronchial alveolar lavage [phone])  Indication(s) Worsening R sided infiltrates  Consent Discussed with patient  Anesthesia Etomidate 74m, dilaudid 136m  Time Out Verified patient identification, verified procedure, site/side was marked, verified correct patient position, special equipment/implants available, medications/allergies/relevant history reviewed, required imaging and test results available.   Sterile Technique Usual hand hygiene, masks, gowns, and gloves were used   Procedure Description After sedation, RT changed out 6-0 shiley for distal XLT Bronchoscope advanced through tracheostomy tube and into airway.  Airways were examined down to subsegmental level with findings noted below.   Following diagnostic evaluation, BAL(s) performed in RUL with normal saline and return of clear fluid  Findings:  - Distal XLT in good position ~4cm above carina - Diffuse severe acute bronchitis throughout - Minimal secretions   Complications/Tolerance None; patient tolerated the procedure well. Chest X-ray is not needed post procedure.   EBL Minimal   Specimen(s) RUL BAL

## 2020-03-30 NOTE — Progress Notes (Signed)
Patient ID: Alex [name], male   DOB: [birth year], 50 y.o.   MRN: [id number]     Advanced Heart Failure Rounding Note  PCP-Cardiologist: No primary care provider on file.   Subjective:    - 12/30: VV ECMO cannulation - 12/31: Left chest tube replaced - 1/2: Extubated. Echo with EF 60-65%, mildly dilated RV with mildly decreased systolic function.  - 1/4: Agitated, suspected aspiration.  Re-intubated.  - 1/5: ECMO cannula repositioned under TEE guidance. TEE showed moderately dilated/moderate-severely dysfunctional RV in setting of hypoxemia. LUE DVT found.  Patient got 1/2 dose of TPA due to initial concern for large PE.  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  - 1/6: Tracheostomy - 1/7: Echo with mild RV dilation/mild RV dysfunction.  - 1/16: Left chest tube out - 1/17: LUE arterial dopplers repeated, showed no obstruction.   Sweep at 4 this morning but tachypneic/increased WOB this morning.   CXR with bilateral infiltrates, looks worse today.    I/Os even with Lasix 40 mg IV x 1 yesterday.   Patient sat up in chair 7 hrs yesterday.   ECMO parameters: 3400 rpm Flow 4.55 L/min Pvenous -69 Delta P 28 Sweep 4   ABG 7.42/59/75/95% LDH  1041 => 998 => 1305 =>  => 901 => 1078 => 1,170 => 1,117 => 911 => 751 => 699 => 691 => 737 Lactate 1.7 PTT 73  Objective:   Weight Range: 64.9 kg Body mass index is 24.56 kg/m.   Vital Signs:   Temp:  [97.3 F (36.3 C)-98.6 F (37 C)] 98.3 F (36.8 C) (01/18 0400) Pulse Rate:  [103-128] 118 (01/18 0600) Resp:  [16-47] 19 (01/18 0600) BP: (117-196)/(73-114) 128/73 (01/18 0600) SpO2:  [94 %-100 %] 99 % (01/18 0600) Arterial Line BP: (150-262)/(55-105) 156/64 (01/18 0600) FiO2 (%):  [50 %] 50 % (01/18 0421) Weight:  [64.9 kg] 64.9 kg (01/18 0400) Last BM Date: 03/28/20  Weight change: Filed Weights   03/28/20 0645 03/29/20 0500 03/30/20 0400  Weight: 65.1 kg 65.2 kg 64.9 kg     Intake/Output:   Intake/Output Summary (Last 24 hours) at 03/30/2020 0739 Last data filed at 03/30/2020 0600 Gross per 24 hour  Intake 2913.34 ml  Output 2835 ml  Net 78.34 ml      Physical Exam    General: Increased work of breathing Neck: Tracheostomy. No JVD, no thyromegaly or thyroid nodule.  Lungs: Decreased bilaterally.  CV: Nondisplaced PMI.  Heart tachy, regular S1/S2, no S3/S4, no murmur.  No peripheral edema.   Abdomen: Soft, nontender, no hepatosplenomegaly, no distention.  Skin: Intact without lesions or rashes.  Neurologic: Awake on vent, follows commands. Extremities: No clubbing or cyanosis.  HEENT: Normal.    Telemetry   NSR 110s-120s Personally reviewed   Labs    CBC Recent Labs    03/29/20 1549 03/29/20 1557 03/30/20 0359 03/30/20 0441  WBC 10.2  --  8.4  --   HGB 9.9*   < > 9.8* 9.9*  HCT 31.1*   < > 29.7* 29.0*  MCV 95.7  --  95.5  --   PLT 191  --  188  --    < > = values in this interval not displayed.   Basic Metabolic Panel Recent Labs    03/28/20 0405 03/28/20 0904 03/29/20 1549 03/29/20 1557 03/30/20 0359 03/30/20 0441  NA 136   < > 145   < > 146* 145  K 3.6   < > 4.3   < >  4.4 3.9  CL 99   < > 104  --  101  --   CO2 27   < > 32  --  33*  --   GLUCOSE 290*   < > 196*  --  256*  --   BUN 31*   < > 41*  --  43*  --   CREATININE 0.83   < > 0.76  --  0.75  --   CALCIUM 8.9   < > 9.2  --  9.5  --   MG 2.4  --   --   --   --   --   PHOS 1.9*  --   --   --   --   --    < > = values in this interval not displayed.   Liver Function Tests Recent Labs    03/29/20 0300 03/30/20 0359  AST 34 32  ALT 41 40  ALKPHOS 141* 137*  BILITOT 1.2 1.3*  PROT 5.9* 6.3*  ALBUMIN 3.5 3.7   No results for input(s): LIPASE, AMYLASE in the last 72 hours. Cardiac Enzymes No results for input(s): CKTOTAL, CKMB, CKMBINDEX, TROPONINI in the last 72 hours.  BNP: BNP (last 3 results) No results for input(s): BNP in the last 8760  hours.  ProBNP (last 3 results) No results for input(s): PROBNP in the last 8760 hours.   D-Dimer No results for input(s): DDIMER in the last 72 hours. Hemoglobin A1C No results for input(s): HGBA1C in the last 72 hours. Fasting Lipid Panel No results for input(s): CHOL, HDL, LDLCALC, TRIG, CHOLHDL, LDLDIRECT in the last 72 hours. Thyroid Function Tests No results for input(s): TSH, T4TOTAL, T3FREE, THYROIDAB in the last 72 hours.  Invalid input(s): FREET3  Other results:   Imaging    DG CHEST PORT 1 VIEW  Result Date: 03/30/2020 CLINICAL DATA:  COVID-19, ARDS, ECMO EXAM: PORTABLE CHEST 1 VIEW COMPARISON:  Radiograph 03/29/2020 FINDINGS: Stable positioning of the ECMO cannula along the right mediastinal border. Left IJ approach central venous catheter tip terminates in the low SVC. Endotracheal tube tip terminates in the mid trachea proximally 5 cm from the carina. A transesophageal tube tip terminates below the margins of imaging, beyond the GE junction. Additional monitoring and external support devices overlie the chest. Persistent multifocal heterogeneous opacities are present throughout both lungs with some increasing attenuation towards the right mid to upper lung when compared to prior. Suspect bilateral effusions. No visible pneumothorax. Stable cardiomediastinal contours accounting for differences in technique. No acute osseous or soft tissue abnormality. IMPRESSION: 1. Persistent multifocal heterogeneous opacities throughout both lungs with some increasing attenuation towards the right mid to upper lung when compared to prior. Suspect small bilateral effusions as well. 2. Stable lines and tubes. Electronically Signed   By: Lovena Le M.D.   On: 03/30/2020 02:35   VAS [place] UPPER EXTREMITY ARTERIAL DUPLEX  Result Date: 03/29/2020 UPPER EXTREMITY DUPLEX STUDY Indications: Patient complains of black fingers. History:     Patient has a history of Covid-19, on ECMO. Patient had  elevated              proximal brachial artery velocities on 03/17/20, most likely from              Covid microemboli.  Limitations: Trach, lines and bandages in chest. Entire hand, including fingers              and wrist, bandaged. Comparison Study: Prior study is available from 03/17/2020  Performing Technologist: Sharion Dove RVS  Examination Guidelines: A complete evaluation includes B-mode imaging, spectral Doppler, color Doppler, and power Doppler as needed of all accessible portions of each vessel. Bilateral testing is considered an integral part of a complete examination. Limited examinations for reoccurring indications may be performed as noted.  Left Doppler Findings: +--------------+----------+---------+--------+--------+ Site          PSV (cm/s)Waveform StenosisComments +--------------+----------+---------+--------+--------+ Subclavian Mid74                                  +--------------+----------+---------+--------+--------+ Axillary      98        triphasic                 +--------------+----------+---------+--------+--------+ Brachial Prox 103       triphasic                 +--------------+----------+---------+--------+--------+ Brachial Mid  107       triphasic                 +--------------+----------+---------+--------+--------+ Brachial Dist 98        triphasic                 +--------------+----------+---------+--------+--------+ Radial Prox   95        triphasic                 +--------------+----------+---------+--------+--------+ Radial Mid    93        triphasic                 +--------------+----------+---------+--------+--------+ Radial Dist   112       triphasic                 +--------------+----------+---------+--------+--------+ Ulnar Prox    66        triphasic                 +--------------+----------+---------+--------+--------+ Ulnar Mid     50        triphasic                  +--------------+----------+---------+--------+--------+ Ulnar Dist    -38       triphasic                 +--------------+----------+---------+--------+--------+   Summary:  Left: No obstruction visualized in the left upper extremity Elevated       velocities found 03/17/20, in the brachial artery are no longer       present. *See table(s) above for measurements and observations. Electronically signed by Ruta Hinds MD on 03/29/2020 at 5:17:39 PM.    Final      Medications:     Scheduled Medications: . barium  200 mL Oral Once  . chlorhexidine gluconate (MEDLINE KIT)  15 mL Mouth Rinse BID  . Chlorhexidine Gluconate Cloth  6 each Topical Daily  . clonazePAM  1 mg Per Tube Q6H  . cloNIDine  0.2 mg Per Tube Q8H  . docusate  100 mg Per Tube BID  . EPINEPHrine      . free water  200 mL Per Tube Q6H  . furosemide  40 mg Intravenous BID  . guaiFENesin-dextromethorphan  10 mL Per Tube BID  . insulin aspart  0-20 Units Subcutaneous Q4H  . insulin aspart  10 Units Subcutaneous Q4H  . insulin detemir  30 Units Subcutaneous BID  . living well with diabetes book  Does not apply Once  . mouth rinse  15 mL Mouth Rinse 10 times per day  . metoCLOPramide (REGLAN) injection  10 mg Intravenous Q6H  . metoprolol tartrate  50 mg Per Tube BID  . oxyCODONE  20 mg Per Tube Q6H  . pantoprazole sodium  40 mg Per Tube QHS  . polyethylene glycol  17 g Per Tube BID  . predniSONE  10 mg Per Tube Q breakfast  . QUEtiapine  100 mg Per Tube TID  . sennosides  10 mL Per Tube BID  . sodium chloride flush  10-40 mL Intracatheter Q12H    Infusions: . sodium chloride    . sodium chloride Stopped (03/23/20 0951)  . sodium chloride Stopped (03/26/20 0829)  . sodium chloride Stopped (03/12/20 0131)  . albumin human 60 mL/hr at 03/29/20 1500  . bivalirudin (ANGIOMAX) infusion 0.5 mg/mL (Non-ACS indications) 0.17 mg/kg/hr (03/29/20 1800)  . feeding supplement (PIVOT 1.5 CAL) 65 mL/hr at 03/29/20 1900  .  fentaNYL infusion INTRAVENOUS Stopped (03/25/20 1549)    PRN Medications: Place/Maintain arterial line **AND** sodium chloride, sodium chloride, sodium chloride, acetaminophen (TYLENOL) oral liquid 160 mg/5 mL, albumin human, chlorpheniramine-HYDROcodone, guaiFENesin, haloperidol lactate, hydrALAZINE, HYDROmorphone (DILAUDID) injection, labetalol, lip balm, LORazepam, morphine, ondansetron (ZOFRAN) IV, oxyCODONE, sodium chloride flush   Assessment/Plan   1. Acute hypoxemic respiratory failure: Due to COVID-19 PNA with bilateral infiltrates.  Refractory hypoxemia, VV-ECMO cannulation on 03/07/2020 with improvement in oxygenation.  Developed left PTX post-subclavian CVL and had left chest tube, the left lung is re-expanded and CT out.  He was extubated 1/2 but reintubated 1/4 with agitation and suspected aspiration.  Tracheostomy 1/6.  CXR with bilateral infiltrates. ECMO cannula repositioned 1/5.  LDH mildly higher. Sweep of 4 but increased work of breathing today. He is now off abx. Pain control better with CT out. CXR worse today, afebrile with WBCs 8.4.  - Patient has had remdesivir, tocilizumab. - Ongoing steroids with prednisone.  - ECMO circuit functioning appropriately. Several clots in oxygenator chronically. - Continue bivalirudin, goal PTT 65-80. He is at 43 today. Discussed dosing with PharmD personally. - Sweep back up to 8 today to rest.  - Lasix 40 mg IV bid today.  - With worsening CXR, will culture blood and trach aspirate.  2. RLE DVT/LUE DVT/thrombus in RV/suspect PE: Echo with moderately dilated and moderately dysfunctional RV.  Clot noted on TEE in RV as well.  TTE 1/2 showed normal EF 60-65%, RV improved (mildly dilated/dysfunctional). TEE on 1/5 with moderate to severe RV dysfunction but patient was hypoxemic.  Had 1/2 dose TPA on 1/5. Echo 1/7 with mildly dilated/mildly dysfunctional RV.  - Bivalirudin for goal PTT 65-80.  Management as above 3. Left PTX: Left chest tube, lung  is re-expanded. Tube now out, stable CXR. 4. Shock: Suspect septic/distributive.  Now resolved, off NE.  5. Anemia: Hgb 9.8. transfuse < 8.   6. AKI: Resolved.  7. Hyperglycemia: insulin.  8. HTN: BP mildly elevated today.  - On clonidine.  - Increase metoprolol to 50 mg bid.  9. CHB: Episode of CHB when hypoxemic.  NSR since then.  Has vagal episodes with prolonged coughing.  - With elevated HR, will increase metoprolol to 50 mg bid but follow closely.  10. Thrombocytopenia: Resolved  11. Ileus: Resolved. TFs restarted.  - Getting Reglan  - Cor-trak repositioned to post-pyloric placement 12. Ischemic digits: LUE.  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  Repeat study 1/17 showed no obstruction.  CRITICAL CARE Performed by: Loralie Champagne  Total critical care time: 40 minutes  Critical care time was exclusive of separately billable procedures and treating other patients.  Critical care was necessary to treat or prevent imminent or life-threatening deterioration.  Critical care was time spent personally by me on the following activities: development of treatment plan with patient and/or surrogate as well as nursing, discussions with consultants, evaluation of patient's response to treatment, examination of patient, obtaining history from patient or surrogate, ordering and performing treatments and interventions, ordering and review of laboratory studies, ordering and review of radiographic studies, pulse oximetry and re-evaluation of patient's condition.    Length of Stay: 36  Loralie Champagne, MD  03/30/2020, 7:39 AM  Advanced Heart Failure Team Pager [phone] (M-F; 7a - 4p)  Please contact [facility] for night-coverage after hours (4p -7a ) and weekends on [email or web address]

## 2020-03-31 ENCOUNTER — Inpatient Hospital Stay (HOSPITAL_COMMUNITY): Payer: Medicaid Other

## 2020-03-31 LAB — PROTIME-INR
INR: 1.7 — ABNORMAL HIGH (ref 0.8–1.2)
Prothrombin Time: 19.6 seconds — ABNORMAL HIGH (ref 11.4–15.2)

## 2020-03-31 LAB — POCT I-STAT 7, (LYTES, BLD GAS, ICA,H+H)
Acid-Base Excess: 21 mmol/L — ABNORMAL HIGH (ref 0.0–2.0)
Acid-Base Excess: 19 mmol/L — ABNORMAL HIGH (ref 0.0–2.0)
Acid-Base Excess: 19 mmol/L — ABNORMAL HIGH (ref 0.0–2.0)
Acid-Base Excess: 18 mmol/L — ABNORMAL HIGH (ref 0.0–2.0)
Acid-Base Excess: 15 mmol/L — ABNORMAL HIGH (ref 0.0–2.0)
Bicarbonate: 47.9 mmol/L — ABNORMAL HIGH (ref 20.0–28.0)
Bicarbonate: 46.8 mmol/L — ABNORMAL HIGH (ref 20.0–28.0)
Bicarbonate: 46.4 mmol/L — ABNORMAL HIGH (ref 20.0–28.0)
Bicarbonate: 45.2 mmol/L — ABNORMAL HIGH (ref 20.0–28.0)
Bicarbonate: 42.3 mmol/L — ABNORMAL HIGH (ref 20.0–28.0)
Calcium, Ion: 1.24 mmol/L (ref 1.15–1.40)
Calcium, Ion: 1.23 mmol/L (ref 1.15–1.40)
Calcium, Ion: 1.25 mmol/L (ref 1.15–1.40)
Calcium, Ion: 1.22 mmol/L (ref 1.15–1.40)
Calcium, Ion: 1.25 mmol/L (ref 1.15–1.40)
HCT: 27 % — ABNORMAL LOW (ref 39.0–52.0)
HCT: 29 % — ABNORMAL LOW (ref 39.0–52.0)
HCT: 28 % — ABNORMAL LOW (ref 39.0–52.0)
HCT: 27 % — ABNORMAL LOW (ref 39.0–52.0)
HCT: 25 % — ABNORMAL LOW (ref 39.0–52.0)
Hemoglobin: 9.2 g/dL — ABNORMAL LOW (ref 13.0–17.0)
Hemoglobin: 9.9 g/dL — ABNORMAL LOW (ref 13.0–17.0)
Hemoglobin: 9.5 g/dL — ABNORMAL LOW (ref 13.0–17.0)
Hemoglobin: 9.2 g/dL — ABNORMAL LOW (ref 13.0–17.0)
Hemoglobin: 8.5 g/dL — ABNORMAL LOW (ref 13.0–17.0)
O2 Saturation: 94 %
O2 Saturation: 94 %
O2 Saturation: 98 %
O2 Saturation: 95 %
O2 Saturation: 96 %
Patient temperature: 98.7
Patient temperature: 98.7
Patient temperature: 37
Patient temperature: 37
Patient temperature: 37
Potassium: 3.7 mmol/L (ref 3.5–5.1)
Potassium: 3.8 mmol/L (ref 3.5–5.1)
Potassium: 3.2 mmol/L — ABNORMAL LOW (ref 3.5–5.1)
Potassium: 3.5 mmol/L (ref 3.5–5.1)
Potassium: 3.5 mmol/L (ref 3.5–5.1)
Sodium: 146 mmol/L — ABNORMAL HIGH (ref 135–145)
Sodium: 146 mmol/L — ABNORMAL HIGH (ref 135–145)
Sodium: 147 mmol/L — ABNORMAL HIGH (ref 135–145)
Sodium: 145 mmol/L (ref 135–145)
Sodium: 145 mmol/L (ref 135–145)
TCO2: 50 mmol/L — ABNORMAL HIGH (ref 22–32)
TCO2: 49 mmol/L — ABNORMAL HIGH (ref 22–32)
TCO2: 49 mmol/L — ABNORMAL HIGH (ref 22–32)
TCO2: 47 mmol/L — ABNORMAL HIGH (ref 22–32)
TCO2: 44 mmol/L — ABNORMAL HIGH (ref 22–32)
pCO2 arterial: 65.6 mmHg (ref 32.0–48.0)
pCO2 arterial: 71.4 mmHg (ref 32.0–48.0)
pCO2 arterial: 74.5 mmHg (ref 32.0–48.0)
pCO2 arterial: 75.8 mmHg (ref 32.0–48.0)
pCO2 arterial: 68 mmHg (ref 32.0–48.0)
pH, Arterial: 7.472 — ABNORMAL HIGH (ref 7.350–7.450)
pH, Arterial: 7.424 (ref 7.350–7.450)
pH, Arterial: 7.402 (ref 7.350–7.450)
pH, Arterial: 7.384 (ref 7.350–7.450)
pH, Arterial: 7.402 (ref 7.350–7.450)
pO2, Arterial: 71 mmHg — ABNORMAL LOW (ref 83.0–108.0)
pO2, Arterial: 72 mmHg — ABNORMAL LOW (ref 83.0–108.0)
pO2, Arterial: 106 mmHg (ref 83.0–108.0)
pO2, Arterial: 80 mmHg — ABNORMAL LOW (ref 83.0–108.0)
pO2, Arterial: 87 mmHg (ref 83.0–108.0)

## 2020-03-31 LAB — GLUCOSE, CAPILLARY
Glucose-Capillary: 108 mg/dL — ABNORMAL HIGH (ref 70–99)
Glucose-Capillary: 115 mg/dL — ABNORMAL HIGH (ref 70–99)
Glucose-Capillary: 120 mg/dL — ABNORMAL HIGH (ref 70–99)
Glucose-Capillary: 159 mg/dL — ABNORMAL HIGH (ref 70–99)
Glucose-Capillary: 165 mg/dL — ABNORMAL HIGH (ref 70–99)
Glucose-Capillary: 177 mg/dL — ABNORMAL HIGH (ref 70–99)
Glucose-Capillary: 56 mg/dL — ABNORMAL LOW (ref 70–99)

## 2020-03-31 LAB — BASIC METABOLIC PANEL
Anion gap: 9 (ref 5–15)
Anion gap: 11 (ref 5–15)
BUN: 44 mg/dL — ABNORMAL HIGH (ref 6–20)
BUN: 48 mg/dL — ABNORMAL HIGH (ref 6–20)
CO2: 42 mmol/L — ABNORMAL HIGH (ref 22–32)
CO2: 39 mmol/L — ABNORMAL HIGH (ref 22–32)
Calcium: 9.8 mg/dL (ref 8.9–10.3)
Calcium: 9.4 mg/dL (ref 8.9–10.3)
Chloride: 96 mmol/L — ABNORMAL LOW (ref 98–111)
Chloride: 97 mmol/L — ABNORMAL LOW (ref 98–111)
Creatinine, Ser: 0.78 mg/dL (ref 0.61–1.24)
Creatinine, Ser: 0.8 mg/dL (ref 0.61–1.24)
GFR, Estimated: >60 mL/min (ref >60)
GFR, Estimated: >60 mL/min (ref >60)
Glucose, Bld: 62 mg/dL — ABNORMAL LOW (ref 70–99)
Glucose, Bld: 118 mg/dL — ABNORMAL HIGH (ref 70–99)
Potassium: 3.9 mmol/L (ref 3.5–5.1)
Potassium: 3.6 mmol/L (ref 3.5–5.1)
Sodium: 147 mmol/L — ABNORMAL HIGH (ref 135–145)
Sodium: 147 mmol/L — ABNORMAL HIGH (ref 135–145)

## 2020-03-31 LAB — CBC
HCT: 29.6 % — ABNORMAL LOW (ref 39.0–52.0)
HCT: 28.7 % — ABNORMAL LOW (ref 39.0–52.0)
Hemoglobin: 9 g/dL — ABNORMAL LOW (ref 13.0–17.0)
Hemoglobin: 8.8 g/dL — ABNORMAL LOW (ref 13.0–17.0)
MCH: 30 pg (ref 26.0–34.0)
MCH: 30.8 pg (ref 26.0–34.0)
MCHC: 30.4 g/dL (ref 30.0–36.0)
MCHC: 30.7 g/dL (ref 30.0–36.0)
MCV: 98.7 fL (ref 80.0–100.0)
MCV: 100.3 fL — ABNORMAL HIGH (ref 80.0–100.0)
Platelets: 200 10*3/uL (ref 150–400)
Platelets: 183 10*3/uL (ref 150–400)
RBC: 3 MIL/uL — ABNORMAL LOW (ref 4.22–5.81)
RBC: 2.86 MIL/uL — ABNORMAL LOW (ref 4.22–5.81)
RDW: 18.5 % — ABNORMAL HIGH (ref 11.5–15.5)
RDW: 17.8 % — ABNORMAL HIGH (ref 11.5–15.5)
WBC: 6.6 10*3/uL (ref 4.0–10.5)
WBC: 7.3 10*3/uL (ref 4.0–10.5)
nRBC: 0.9 % — ABNORMAL HIGH (ref 0.0–0.2)
nRBC: 0.5 % — ABNORMAL HIGH (ref 0.0–0.2)

## 2020-03-31 LAB — HEPATIC FUNCTION PANEL
ALT: 35 U/L (ref 0–44)
AST: 31 U/L (ref 15–41)
Albumin: 3.8 g/dL (ref 3.5–5.0)
Alkaline Phosphatase: 127 U/L — ABNORMAL HIGH (ref 38–126)
Bilirubin, Direct: 0.3 mg/dL — ABNORMAL HIGH (ref 0.0–0.2)
Indirect Bilirubin: 1.1 mg/dL — ABNORMAL HIGH (ref 0.3–0.9)
Total Bilirubin: 1.4 mg/dL — ABNORMAL HIGH (ref 0.3–1.2)
Total Protein: 6.3 g/dL — ABNORMAL LOW (ref 6.5–8.1)

## 2020-03-31 LAB — APTT
aPTT: 82 seconds — ABNORMAL HIGH (ref 24–36)
aPTT: 92 seconds — ABNORMAL HIGH (ref 24–36)
aPTT: 97 seconds — ABNORMAL HIGH (ref 24–36)

## 2020-03-31 LAB — FIBRINOGEN: Fibrinogen: 572 mg/dL — ABNORMAL HIGH (ref 210–475)

## 2020-03-31 LAB — LACTIC ACID, PLASMA
Lactic Acid, Venous: 1.6 mmol/L (ref 0.5–1.9)
Lactic Acid, Venous: 1 mmol/L (ref 0.5–1.9)

## 2020-03-31 LAB — LACTATE DEHYDROGENASE: LDH: 671 U/L — ABNORMAL HIGH (ref 98–192)

## 2020-03-31 MED ORDER — ALBUMIN HUMAN 5 % IV SOLN
INTRAVENOUS | Status: AC
  Administered 2020-03-31: 12.5 g via INTRAVENOUS
  Filled 2020-03-31: qty 250

## 2020-03-31 MED ORDER — SODIUM CHLORIDE 0.9 % IV SOLN
0.0700 mg/kg/h | INTRAVENOUS | Status: DC
  Administered 2020-04-01: 09:00:00 0.11 mg/kg/h via INTRAVENOUS
  Administered 2020-04-02: 0.09 mg/kg/h via INTRAVENOUS
  Administered 2020-04-04 – 2020-04-16 (×8): 0.08 mg/kg/h via INTRAVENOUS
  Filled 2020-03-31 (×13): qty 250

## 2020-03-31 MED ORDER — ACETAZOLAMIDE 250 MG PO TABS
250.0000 mg | ORAL_TABLET | Freq: Every day | ORAL | Status: DC
  Administered 2020-03-31 – 2020-04-02 (×3): 250 mg
  Filled 2020-03-31 (×4): qty 1

## 2020-03-31 MED ORDER — FUROSEMIDE 10 MG/ML IJ SOLN
40.0000 mg | Freq: Once | INTRAMUSCULAR | Status: AC
  Administered 2020-03-31: 40 mg via INTRAVENOUS
  Filled 2020-03-31: qty 4

## 2020-03-31 MED ORDER — VALPROIC ACID 250 MG/5ML PO SOLN
250.0000 mg | Freq: Every day | ORAL | Status: DC
  Administered 2020-03-31 – 2020-04-10 (×11): 250 mg
  Filled 2020-03-31 (×11): qty 5

## 2020-03-31 MED ORDER — ALBUMIN HUMAN 5 % IV SOLN: 12.5000 g | Freq: Once | INTRAVENOUS | Status: AC

## 2020-03-31 MED ORDER — INSULIN ASPART 100 UNIT/ML ~~LOC~~ SOLN
6.0000 [IU] | SUBCUTANEOUS | Status: DC
  Administered 2020-03-31 – 2020-04-10 (×58): 6 [IU] via SUBCUTANEOUS

## 2020-03-31 MED ORDER — ALBUMIN HUMAN 5 % IV SOLN
12.5000 g | INTRAVENOUS | Status: DC | PRN
  Administered 2020-04-11 – 2020-04-22 (×2): 12.5 g via INTRAVENOUS
  Filled 2020-03-31 (×5): qty 250

## 2020-03-31 MED ORDER — ALBUMIN HUMAN 5 % IV SOLN: 12.5000 g | Freq: Once | INTRAVENOUS | Status: DC

## 2020-03-31 MED ORDER — DEXTROSE 50 % IV SOLN
INTRAVENOUS | Status: AC
  Administered 2020-03-31: 50 mL via INTRAVENOUS
  Filled 2020-03-31: qty 50

## 2020-03-31 MED ORDER — SODIUM CHLORIDE 0.9 % IV SOLN
0.5000 mg/kg/h | INTRAVENOUS | Status: DC
  Administered 2020-03-31 – 2020-04-02 (×6): 1 mg/kg/h via INTRAVENOUS
  Administered 2020-04-02 – 2020-04-04 (×3): 0.5 mg/kg/h via INTRAVENOUS
  Filled 2020-03-31 (×12): qty 5

## 2020-03-31 MED ORDER — CLONAZEPAM 1 MG PO TABS
2.0000 mg | ORAL_TABLET | Freq: Four times a day (QID) | ORAL | Status: DC
  Administered 2020-03-31 – 2020-04-04 (×16): 2 mg
  Filled 2020-03-31 (×16): qty 2

## 2020-03-31 MED ORDER — DEXTROSE 50 % IV SOLN: 50.0000 mL | Freq: Once | INTRAVENOUS | Status: AC

## 2020-03-31 NOTE — Progress Notes (Signed)
ANTICOAGULATION CONSULT NOTE - Follow Up Consult  Pharmacy Consult for bivalirudin Indication: ECMO and VTE  Labs: Recent Labs    03/29/20 0300 03/29/20 0800 03/30/20 0359 03/30/20 0441 03/30/20 1600 03/30/20 1610 03/31/20 0433 03/31/20 0445 03/31/20 1127 03/31/20 1620 03/31/20 1627  HGB 9.6*  9.2*   < > 9.8*   < > 8.8*   < > 9.0*   < > 9.5* 9.2* 8.8*  HCT 30.3*  27.0*   < > 29.7*   < > 28.6*   < > 29.6*   < > 28.0* 27.0* 28.7*  PLT 171   < > 188  --  178  --  200  --   --   --  183  APTT 65*   < > 73*  --  81*  --  82*  --   --   --  92*  LABPROT 18.3*  --  18.9*  --   --   --  19.6*  --   --   --   --   INR 1.6*  --  1.6*  --   --   --  1.7*  --   --   --   --   CREATININE 0.76   < > 0.75  --  0.75  --  0.78  --   --   --  0.80   < > = values in this interval not displayed.    Assessment: 23 yoM admitted with COVID-19 PNA with worsening hypoxia, s/p cannulation for ECMO. Pt was started on IV heparin prior to cannulation due to acute DVTs and possible PE,  transitioned to bivalirudin with ECMO. Now s/p tPA on 1/5 and tracheostomy on 1/6.   APTT remains supratherapeutic, trended up to 92 seconds after rate decrease this AM, CBC stable. No bleeding or issues with infusion per discussion with RN. Level drawn appropriately.   Goal of Therapy:  aPTT 60-80 seconds   Plan:  Reduce bivalirudin to 0.14 mg/kg/hr (using order-specific wt 72.1kg) Check 4hr aPTT Monitor daily CBC, LDH, and for s/sx of bleeding    Leia Alf, PharmD, BCPS Please check AMION for all [facility] contact numbers Clinical Pharmacist 03/31/2020 5:36 PM

## 2020-03-31 NOTE — Progress Notes (Signed)
Patient ID: Alex [name], male   DOB: 09-Feb-1971, 50 y.o.   MRN: [id number]     Advanced Heart Failure Rounding Note  PCP-Cardiologist: No primary care provider on file.   Subjective:    - 12/30: VV ECMO cannulation - 12/31: Left chest tube replaced - 1/2: Extubated. Echo with EF 60-65%, mildly dilated RV with mildly decreased systolic function.  - 1/4: Agitated, suspected aspiration.  Re-intubated.  - 1/5: ECMO cannula repositioned under TEE guidance. TEE showed moderately dilated/moderate-severely dysfunctional RV in setting of hypoxemia. LUE DVT found.  Patient got 1/2 dose of TPA due to initial concern for large PE.  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  - 1/6: Tracheostomy - 1/7: Echo with mild RV dilation/mild RV dysfunction.  - 1/16: Left chest tube out - 1/17: LUE arterial dopplers repeated, showed no obstruction.   Sweep at 4 this morning. CXR improved today.  I/Os about 800 cc net negative with Lasix 40 mg IV bid.    Working with PT.   ECMO parameters: 3300 rpm Flow 4.58 L/min Pvenous -74 Delta P 28 Sweep 4 Post-oxygenator PaO2 383  ABG 7.47/66/71/94% LDH  998 => 1305 => 901 => 1078 => 1,170 => 1,117 => 911 => 751 => 699 => 691 => 737 => 671 Lactate 1.6 PTT 82  Objective:   Weight Range: 62.6 kg Body mass index is 23.69 kg/m.   Vital Signs:   Temp:  [98.4 F (36.9 C)-98.8 F (37.1 C)] 98.7 F (37.1 C) (01/19 0400) Pulse Rate:  [108-130] 121 (01/19 0706) Resp:  [12-99] 99 (01/19 0706) BP: (98-179)/(56-95) 132/80 (01/19 0706) SpO2:  [95 %-100 %] 99 % (01/19 0706) Arterial Line BP: (129-250)/(52-121) 241/85 (01/19 0500) FiO2 (%):  [50 %] 50 % (01/19 0706) Weight:  [62.6 kg] 62.6 kg (01/19 0500) Last BM Date: 03/28/20  Weight change: Filed Weights   03/29/20 0500 03/30/20 0400 03/31/20 0500  Weight: 65.2 kg 64.9 kg 62.6 kg    Intake/Output:   Intake/Output Summary (Last 24 hours) at 03/31/2020 0737 Last data filed at  03/31/2020 0700 Gross per 24 hour  Intake 2559.85 ml  Output 3400 ml  Net -840.15 ml      Physical Exam    General: NAD Neck: Tracheostomy. No JVD, no thyromegaly or thyroid nodule.  Lungs: Decreased at bases.  CV: Nondisplaced PMI.  Heart mildly tachy, regular S1/S2, no S3/S4, no murmur.  No peripheral edema.   Abdomen: Soft, nontender, no hepatosplenomegaly, no distention.  Skin: Intact without lesions or rashes.  Neurologic: Follows commands. Extremities: No clubbing or cyanosis.  HEENT: Normal.    Telemetry   NSR 110s-120s Personally reviewed   Labs    CBC Recent Labs    03/30/20 1600 03/30/20 1610 03/31/20 0433 03/31/20 0445  WBC 4.7  --  6.6  --   HGB 8.8*   < > 9.0* 9.2*  HCT 28.6*   < > 29.6* 27.0*  MCV 97.6  --  98.7  --   PLT 178  --  200  --    < > = values in this interval not displayed.   Basic Metabolic Panel Recent Labs    03/30/20 1600 03/30/20 1610 03/31/20 0433 03/31/20 0445  NA 146*   < > 147* 146*  K 3.5   < > 3.9 3.7  CL 98  --  96*  --   CO2 37*  --  42*  --   GLUCOSE 84  --  62*  --  BUN 43*  --  44*  --   CREATININE 0.75  --  0.78  --   CALCIUM 9.4  --  9.8  --    < > = values in this interval not displayed.   Liver Function Tests Recent Labs    03/30/20 0359 03/31/20 0433  AST 32 31  ALT 40 35  ALKPHOS 137* 127*  BILITOT 1.3* 1.4*  PROT 6.3* 6.3*  ALBUMIN 3.7 3.8   No results for input(s): LIPASE, AMYLASE in the last 72 hours. Cardiac Enzymes No results for input(s): CKTOTAL, CKMB, CKMBINDEX, TROPONINI in the last 72 hours.  BNP: BNP (last 3 results) No results for input(s): BNP in the last 8760 hours.  ProBNP (last 3 results) No results for input(s): PROBNP in the last 8760 hours.   D-Dimer No results for input(s): DDIMER in the last 72 hours. Hemoglobin A1C No results for input(s): HGBA1C in the last 72 hours. Fasting Lipid Panel No results for input(s): CHOL, HDL, LDLCALC, TRIG, CHOLHDL, LDLDIRECT in  the last 72 hours. Thyroid Function Tests No results for input(s): TSH, T4TOTAL, T3FREE, THYROIDAB in the last 72 hours.  Invalid input(s): FREET3  Other results:   Imaging    DG Chest 1 View  Result Date: 03/30/2020 CLINICAL DATA:  Tracheostomy position EXAM: CHEST  1 VIEW COMPARISON:  Portable lateral views at 0935 hours compared to earlier AP view of 02/20 5 hours FINDINGS: The external portion of the tracheostomy tube is visible, with the internal portion of the tube inadequately visualized. Feeding tube is seen extending into stomach. Large-bore ECMO catheter is present. Under penetration of the lung fields, though diffuse infiltrates are seen, suboptimally assessed. IMPRESSION: Inadequate tracheostomy tube visualization. Electronically Signed   By: Lavonia Dana M.D.   On: 03/30/2020 09:48     Medications:     Scheduled Medications: . acetaZOLAMIDE  250 mg Per Tube Daily  . barium  200 mL Oral Once  . chlorhexidine gluconate (MEDLINE KIT)  15 mL Mouth Rinse BID  . Chlorhexidine Gluconate Cloth  6 each Topical Daily  . clonazePAM  1 mg Per Tube Q6H  . cloNIDine  0.2 mg Per Tube Q8H  . docusate  100 mg Per Tube BID  . free water  200 mL Per Tube Q6H  . furosemide  40 mg Intravenous Once  . guaiFENesin-dextromethorphan  10 mL Per Tube BID  . insulin aspart  0-20 Units Subcutaneous Q4H  . insulin aspart  10 Units Subcutaneous Q4H  . insulin detemir  35 Units Subcutaneous BID  . living well with diabetes book   Does not apply Once  . mouth rinse  15 mL Mouth Rinse 10 times per day  . metoCLOPramide (REGLAN) injection  10 mg Intravenous Q6H  . metoprolol tartrate  50 mg Per Tube BID  . oxyCODONE  20 mg Per Tube Q6H  . pantoprazole sodium  40 mg Per Tube QHS  . polyethylene glycol  17 g Per Tube BID  . predniSONE  10 mg Per Tube Q breakfast  . QUEtiapine  200 mg Per Tube TID  . sennosides  10 mL Per Tube BID  . sodium chloride flush  10-40 mL Intracatheter Q12H     Infusions: . sodium chloride    . sodium chloride Stopped (03/23/20 0951)  . sodium chloride Stopped (03/26/20 0829)  . sodium chloride Stopped (03/12/20 0131)  . albumin human Stopped (03/30/20 1410)  . bivalirudin (ANGIOMAX) infusion 0.5 mg/mL (Non-ACS indications) 0.17 mg/kg/hr (  03/31/20 0700)  . feeding supplement (PIVOT 1.5 CAL) 65 mL/hr at 03/31/20 0700  . fentaNYL infusion INTRAVENOUS Stopped (03/25/20 1549)    PRN Medications: Place/Maintain arterial line **AND** sodium chloride, sodium chloride, sodium chloride, acetaminophen (TYLENOL) oral liquid 160 mg/5 mL, albumin human, chlorpheniramine-HYDROcodone, guaiFENesin, haloperidol lactate, hydrALAZINE, HYDROmorphone (DILAUDID) injection, labetalol, lidocaine, lip balm, LORazepam, morphine, ondansetron (ZOFRAN) IV, oxyCODONE, sodium chloride flush   Assessment/Plan   1. Acute hypoxemic respiratory failure: Due to COVID-19 PNA with bilateral infiltrates.  Refractory hypoxemia, VV-ECMO cannulation on 02/23/2020 with improvement in oxygenation.  Developed left PTX post-subclavian CVL and had left chest tube, the left lung is re-expanded and CT out.  He was extubated 1/2 but reintubated 1/4 with agitation and suspected aspiration.  Tracheostomy 1/6.  CXR with bilateral infiltrates, improved today. ECMO cannula repositioned 1/5.  LDH lower today. Sweep at 4. He is now off abx.  - Patient has had remdesivir, tocilizumab. - Ongoing steroids with prednisone.  - ECMO circuit functioning appropriately. Several clots in oxygenator chronically. Good post-oxygenator PaO2 yesterday.  - Continue bivalirudin, goal PTT 65-80. He is at 58 today, adjust dose.  - Good pH, wean sweep to 3 today.  - Lasix 40 mg IV x 1 + acetazolamide 250 today with elevated HCO3.  2. RLE DVT/LUE DVT/thrombus in RV/suspect PE: Echo with moderately dilated and moderately dysfunctional RV.  Clot noted on TEE in RV as well.  TTE 1/2 showed normal EF 60-65%, RV improved  (mildly dilated/dysfunctional). TEE on 1/5 with moderate to severe RV dysfunction but patient was hypoxemic.  Had 1/2 dose TPA on 1/5. Echo 1/7 with mildly dilated/mildly dysfunctional RV.  - Bivalirudin for goal PTT 65-80.  Management as above 3. Left PTX: Left chest tube, lung is re-expanded. Tube now out, stable CXR. 4. Shock: Suspect septic/distributive.  Now resolved, off NE.  5. Anemia: Hgb 9. transfuse < 8.   6. AKI: Resolved.  7. Hyperglycemia: insulin.  8. HTN: Remains elevated, following cuff pressure due to whip in the arterial line.   - On clonidine.  - Continue metoprolol 50 mg bid.  9. CHB: Episode of CHB when hypoxemic.  NSR since then.  Has vagal episodes with prolonged coughing.  - Continue metoprolol, watch rhythm.  10. Thrombocytopenia: Resolved  11. Ileus: Resolved. TFs restarted.  - Getting Reglan  - Cor-trak repositioned to post-pyloric placement 12. Ischemic digits: LUE.  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  Repeat study 1/17 showed no obstruction.   CRITICAL CARE Performed by: Loralie Champagne  Total critical care time: 40 minutes  Critical care time was exclusive of separately billable procedures and treating other patients.  Critical care was necessary to treat or prevent imminent or life-threatening deterioration.  Critical care was time spent personally by me on the following activities: development of treatment plan with patient and/or surrogate as well as nursing, discussions with consultants, evaluation of patient's response to treatment, examination of patient, obtaining history from patient or surrogate, ordering and performing treatments and interventions, ordering and review of laboratory studies, ordering and review of radiographic studies, pulse oximetry and re-evaluation of patient's condition.    Length of Stay: Ona, MD  03/31/2020, 7:37 AM  Advanced Heart Failure Team Pager [phone] (M-F; 7a - 4p)  Please  contact [facility] for night-coverage after hours (4p -7a ) and weekends on [email or web address]

## 2020-03-31 NOTE — Progress Notes (Signed)
ECMO PROGRESS NOTE  NAME:  Alex [name], MRN:  [id number], DOB:  08-25-1970, LOS: 29 ADMISSION DATE:  02/16/2020, CONSULTATION DATE: 03/12/2020 REFERRING MD: Ander Slade -LBPCCM, CHIEF COMPLAINT: Respiratory failure requiring ECMO  HPI/course in hospital  50 year old man admitted to hospital 12/28 with 1 week history of dyspnea cough nausea and vomiting.  Initially admitted to [facility] and placed on high flow nasal cannula but rapidly failed and required intubation 12/29.  Persistent hypoxic respiratory failure with PF ratio 55 in spite of 18 of PEEP FiO2 0.1 despite paralytics.  Did not improve with prone ventilation  Cannulated for VV ECMO 12/30 via right IJ crescent cannula. Iatrogenic pneumothorax from left subclavian triple-lumen placement  Family confirms no significant past medical history apart from possible prediabetes  Past Medical History  none  Interim history/subjective:  Trach changed out yesterday for distal XLT. Still having issues with agitation causing BP/HR spikes.  Objective   Blood pressure 132/80, pulse (!) 121, temperature 98.7 F (37.1 C), temperature source Oral, resp. rate (!) 99, height _0  (1.626 m), weight 62.6 kg, SpO2 99 %. CVP:  [3 mmHg-88 mmHg] 12 mmHg  Vent Mode: PCV FiO2 (%):  [50 %] 50 % Set Rate:  [20 bmp] 20 bmp PEEP:  [8 cmH20] 8 cmH20   Intake/Output Summary (Last 24 hours) at 03/31/2020 0820 Last data filed at 03/31/2020 0700 Gross per 24 hour  Intake 2445.81 ml  Output 3150 ml  Net -704.19 ml   Filed Weights   03/29/20 0500 03/30/20 0400 03/31/20 0500  Weight: 65.2 kg 64.9 kg 62.6 kg    ECMO Device: Cardiohelp  ECMO Mode: VV  Flow (LPM): 4.62   Examination: Constitutional: anxious man tachypneic on vent  Eyes: EOMI, pupils equal Ears, nose, mouth, and throat: MMM, tracheostomy in place with trace bloody secreations Cardiovascular: tachycardic, ext warm, L hand necrotic Respiratory: tachypneic, rhonci  bilaterally, + accessory muscle use Gastrointestinal: soft, +BS Skin: No rashes, normal turgor Neurologic: moves all 4 ext to command Psychiatric: anxious  Net -840 yesterday CXR somehow looks better Sodium and bicarb up LDH/fibrinogen improved Pct neg CBC good  Assessment & Plan:  Acute hypoxemic and hypercarbic respiratory failure secondary to severe COVID ARDS, likely PE, and RV dysfunction s/p VV ECMO. Need for sedation with mechanical ventilation Refractory coughing - Continue VV support. Monitor usual parameters - Bival PTT 60-80 - Monitor LDH/fibrinogen/circuit clots/delta P - f/u BAL but CXR looks better today - lidocaine/morphine nebs for cough - try to keep even with balanced diuresis - need to work on his PAD today (see next problem)  Anxiety, air hunger, intermittent delirium - Continue PRN dilaudid - Continue standing seroquel (increased 1/18), klonipin (increasing today), oxycodone, start valproate qHS - Trial of ketamine  HTN - Continue metoprolol - Pay attention to other causes of HTN: pain/resp distress/anxiety agitation  Gastroparesis with some element ileus- improved - Continue reglan - postpyloric TF  Ischemic changes L hand- due to pressors and LUE DVT impeding blood flow, stable, monitor, L radial line as been removed; repeat LUE arterial duplex improved - Supportive care, clinical monitoring  DM2 with hyperglycemia- levemir and basal-bolus as ordered, bottomed out last night, will decrease amount of novolog  Muscular deconditioning- appreciate PT help up to chair daily, may have to increase sweep for this  Best practice:  Diet: TF Pain/Anxiety/Delirium protocol (if indicated): see above VAP protocol (if indicated): in place DVT prophylaxis: bival gtt GI prophylaxis: PPI Glucose control: SSI Mobility:  up with PT as tolerated Code Status: full Family Communication: per ecmo team Disposition: ICU   Patient critically ill due to COVID  ARDS Interventions to address this today vent titration, sedation titration, ECMO titration Risk of deterioration without these interventions is high  I personally spent 43 minutes providing critical care not including any separately billable procedures  Erskine Emery MD [facility] 03/31/2020 8:20 AM Personal pager: [phone] If unanswered, please page CCM On-call: [phone]

## 2020-03-31 NOTE — Progress Notes (Signed)
ANTICOAGULATION CONSULT NOTE - Follow Up Consult  Pharmacy Consult for bivalirudin Indication: ECMO and VTE  Labs: Recent Labs    03/29/20 0300 03/29/20 0800 03/30/20 0359 03/30/20 0441 03/30/20 1600 03/30/20 1610 03/30/20 2338 03/31/20 0433 03/31/20 0445  HGB 9.6*  9.2*   < > 9.8*   < > 8.8*   < > 8.5* 9.0* 9.2*  HCT 30.3*  27.0*   < > 29.7*   < > 28.6*   < > 25.0* 29.6* 27.0*  PLT 171   < > 188  --  178  --   --  200  --   APTT 65*   < > 73*  --  81*  --   --  82*  --   LABPROT 18.3*  --  18.9*  --   --   --   --  19.6*  --   INR 1.6*  --  1.6*  --   --   --   --  1.7*  --   CREATININE 0.76   < > 0.75  --  0.75  --   --  0.78  --    < > = values in this interval not displayed.    Assessment: 69 yoM admitted with COVID-19 PNA with worsening hypoxia, s/p cannulation for ECMO. Pt was started on IV heparin prior to cannulation due to acute DVTs and possible PE,  transitioned to bivalirudin with ECMO. Now s/p tPA on 1/5 and tracheostomy on 1/6.   APTT slightly supratherapeutic at 82 seconds, CBC stable.    Goal of Therapy:  aPTT 60-80 seconds   Plan:  Reduce bivalirudin to 0.16 mg/kg/hr Monitor CBC, 0500/1700 aPTT, LDH, and for s/sx of bleeding    Fredonia Highland, PharmD, BCPS, [facility] Clinical Pharmacist [phone] Please check AMION for all [facility] numbers 03/31/2020

## 2020-03-31 NOTE — Progress Notes (Signed)
OT Cancellation Note  Patient Details Name: Alex [name] MRN: [id number] DOB: 25-May-1970   Cancelled Treatment:    Reason Eval/Treat Not Completed: Medical issues which prohibited therapy.  Nursing advised to hold due to increased agitation this date.  Continue efforts.    Richard D Popella 03/31/2020, 9:20 AM

## 2020-03-31 NOTE — Progress Notes (Signed)
   Palliative Medicine Inpatient Follow Up Note  HPI:49 y.o. male  with no significant past medical history admitted on 03/05/2020 with dyspnea, cough, nausea/vomiting ~ 1 week ago with worsening symptoms of body aches and fatigue and +COVID 02/07/20 and admitted with shortness of breath. Required intubation 03/10/20. Cannulated for VV ECMO 02/26/2020. Oxygenating better with ECMO. Also evidence of RLE DVT, RV thrombus, and high suspicion of PE. Has required chest tube to L lung for collapse which needs further reposition with recurrent collapse. Trach placed 03-18-20 Continue signs of improvement, however agitation continue to prohibit progression/ trial of Ketamine  - sweep to 4 this morning  Today's Discussion (03/31/2020):  Chart reviewed.   Collaborated case with team   Currently calm and resting on sedation    I spoke to [name]/son by telephone and updated him on his father's condition.    Questions and concerns addressed    Emotional support offered  Discussed the importance of continued conversation with his support people (Josh tells me that the patient's brother and best friend have been a great support to him through this difficult time )and the  medical providers regarding overall plan of care and treatment options, ensuring decisions are within the context of the patients values and GOCs.   SUMMARY OF RECOMMENDATIONS   Full Code / Full Scope of treatment  ECMO as a bridge to recovery  Ongoing Palliative care support  Spiritual Support - ongoing support from Stephanie Acre  Chaplain with PMT  Time Spent: 20  Greater than 50% of the time was spent in counseling and coordination of care ______________________________________________________________________________________ Lorinda Creed NP [facility] Palliative Medicine Team Team Cell Phone: [phone] Please utilize secure chat with additional questions, if there is no response within 30 minutes please call the above phone  number  Palliative Medicine Team providers are available by phone from 7am to 7pm daily and can be reached through the team cell phone.  Should this patient require assistance outside of these hours, please call the patient's attending physician.

## 2020-03-31 NOTE — Progress Notes (Signed)
SLP Cancellation Note  Patient Details Name: Alex [name] MRN: [id number] DOB: 08-25-1970   Cancelled treatment:       Reason Eval/Treat Not Completed: Medical issues which prohibited therapy. SLP checked in with RN/RT intermittently throughout the day. Pt has been requiring sedation, and they advise holding tx for today. Will f/u for ongoing inline PMV trials.     Mahala Menghini., M.A. CCC-SLP Acute Rehabilitation Services Pager [phone] Office [phone]  03/31/2020, 3:16 PM

## 2020-03-31 NOTE — Procedures (Signed)
Extracorporeal support note  ECLS cannulation date: 12/30 Last circuit change: n/a  Indication: Severe respiratory failure secondary to COVID-19 pneumonia with RV dysfunction  Configuration: Venovenous  Drainage cannula: 32 French crescent cannula via right IJ Return cannula: Same  Pump speed: 3300 RPM Pump flow: 4.62 L/min Pump used: Cardio help  Oxygenator: Cardio help O2 blender: 100% Sweep gas: 4L  Circuit check: clots at corners of oxygenator L>R, stable Anticoagulant: Bivalirudin Anticoagulation targets: PTT 60-80  Changes in support: wean sweep, work on sedation regimen  Anticipated goals/duration of support: Bridge to recovery.  Multidisciplinary ECMO rounds completed.   Lorin Glass, MD 03/31/20 9:59 AM [facility]

## 2020-03-31 NOTE — Progress Notes (Signed)
Assisted tele visit to patient with son.  Evanny Ellerbe Anderson, RN   

## 2020-03-31 NOTE — Plan of Care (Signed)
  Problem: Activity: Goal: Ability to tolerate increased activity will improve Outcome: Progressing   Problem: Respiratory: Goal: Ability to maintain a clear airway and adequate ventilation will improve Outcome: Progressing   

## 2020-03-31 NOTE — Progress Notes (Signed)
PT Cancellation Note  Patient Details Name: Alex [name] MRN: [id number] DOB: 1971-01-03   Cancelled Treatment:    Reason Eval/Treat Not Completed: Medical issues which prohibited therapy (Nurse states pt agitated and they incr meds to calm him. Also drawing ABGs.  Will check back tomorrow per nurse request.)   Berline Lopes 03/31/2020, 9:13 AM Briseida Gittings W,PT Acute Rehabilitation Services Pager:  [phone]  Office:  [phone]

## 2020-03-31 NOTE — Progress Notes (Addendum)
ANTICOAGULATION CONSULT NOTE - Follow Up Consult  Pharmacy Consult for bivalirudin Indication: ECMO and VTE  Labs: Recent Labs    03/29/20 0300 03/29/20 0800 03/30/20 0359 03/30/20 0441 03/30/20 1600 03/30/20 1610 03/31/20 0433 03/31/20 0445 03/31/20 1620 03/31/20 1627 03/31/20 2007 03/31/20 2200  HGB 9.6*  9.2*   < > 9.8*   < > 8.8*   < > 9.0*   < > 9.2* 8.8* 8.5*  --   HCT 30.3*  27.0*   < > 29.7*   < > 28.6*   < > 29.6*   < > 27.0* 28.7* 25.0*  --   PLT 171   < > 188  --  178  --  200  --   --  183  --   --   APTT 65*   < > 73*  --  81*  --  82*  --   --  92*  --  97*  LABPROT 18.3*  --  18.9*  --   --   --  19.6*  --   --   --   --   --   INR 1.6*  --  1.6*  --   --   --  1.7*  --   --   --   --   --   CREATININE 0.76   < > 0.75  --  0.75  --  0.78  --   --  0.80  --   --    < > = values in this interval not displayed.    Assessment: 50yo male remains supratherapeutic on bivalirudin with higher PTT despite decreased rate; no gtt issues or signs of bleeding per RN.  Goal of Therapy:  aPTT 60-80 seconds   Plan:  Will decrease bivalirudin gtt by ~15% to 0.11 mg/kg/hr and check PTT with next scheduled labs.    Vernard Gambles, PharmD, BCPS  03/31/2020,11:31 PM   Addendum: PTT now therapeutic at 78. Will continue gtt at current rate and monitor Q12H PTT.    VB 04/01/2020 4:52 AM

## 2020-04-01 ENCOUNTER — Inpatient Hospital Stay (HOSPITAL_COMMUNITY): Payer: Medicaid Other

## 2020-04-01 DIAGNOSIS — I2602 Saddle embolus of pulmonary artery with acute cor pulmonale: Secondary | ICD-10-CM

## 2020-04-01 LAB — POCT I-STAT 7, (LYTES, BLD GAS, ICA,H+H)
Acid-Base Excess: 10 mmol/L — ABNORMAL HIGH (ref 0.0–2.0)
Acid-Base Excess: 10 mmol/L — ABNORMAL HIGH (ref 0.0–2.0)
Acid-Base Excess: 12 mmol/L — ABNORMAL HIGH (ref 0.0–2.0)
Acid-Base Excess: 13 mmol/L — ABNORMAL HIGH (ref 0.0–2.0)
Acid-Base Excess: 14 mmol/L — ABNORMAL HIGH (ref 0.0–2.0)
Acid-Base Excess: 8 mmol/L — ABNORMAL HIGH (ref 0.0–2.0)
Bicarbonate: 35.5 mmol/L — ABNORMAL HIGH (ref 20.0–28.0)
Bicarbonate: 36.4 mmol/L — ABNORMAL HIGH (ref 20.0–28.0)
Bicarbonate: 36.9 mmol/L — ABNORMAL HIGH (ref 20.0–28.0)
Bicarbonate: 39.1 mmol/L — ABNORMAL HIGH (ref 20.0–28.0)
Bicarbonate: 40 mmol/L — ABNORMAL HIGH (ref 20.0–28.0)
Bicarbonate: 40.8 mmol/L — ABNORMAL HIGH (ref 20.0–28.0)
Calcium, Ion: 1.25 mmol/L (ref 1.15–1.40)
Calcium, Ion: 1.25 mmol/L (ref 1.15–1.40)
Calcium, Ion: 1.25 mmol/L (ref 1.15–1.40)
Calcium, Ion: 1.28 mmol/L (ref 1.15–1.40)
Calcium, Ion: 1.29 mmol/L (ref 1.15–1.40)
Calcium, Ion: 1.3 mmol/L (ref 1.15–1.40)
HCT: 23 % — ABNORMAL LOW (ref 39.0–52.0)
HCT: 25 % — ABNORMAL LOW (ref 39.0–52.0)
HCT: 26 % — ABNORMAL LOW (ref 39.0–52.0)
HCT: 26 % — ABNORMAL LOW (ref 39.0–52.0)
HCT: 27 % — ABNORMAL LOW (ref 39.0–52.0)
HCT: 27 % — ABNORMAL LOW (ref 39.0–52.0)
Hemoglobin: 7.8 g/dL — ABNORMAL LOW (ref 13.0–17.0)
Hemoglobin: 8.5 g/dL — ABNORMAL LOW (ref 13.0–17.0)
Hemoglobin: 8.8 g/dL — ABNORMAL LOW (ref 13.0–17.0)
Hemoglobin: 8.8 g/dL — ABNORMAL LOW (ref 13.0–17.0)
Hemoglobin: 9.2 g/dL — ABNORMAL LOW (ref 13.0–17.0)
Hemoglobin: 9.2 g/dL — ABNORMAL LOW (ref 13.0–17.0)
O2 Saturation: 94 %
O2 Saturation: 95 %
O2 Saturation: 96 %
O2 Saturation: 96 %
O2 Saturation: 98 %
O2 Saturation: 98 %
Patient temperature: 36.8
Patient temperature: 37
Patient temperature: 37
Patient temperature: 37
Patient temperature: 37
Potassium: 3.3 mmol/L — ABNORMAL LOW (ref 3.5–5.1)
Potassium: 3.6 mmol/L (ref 3.5–5.1)
Potassium: 3.8 mmol/L (ref 3.5–5.1)
Potassium: 3.8 mmol/L (ref 3.5–5.1)
Potassium: 3.8 mmol/L (ref 3.5–5.1)
Potassium: 4.1 mmol/L (ref 3.5–5.1)
Sodium: 144 mmol/L (ref 135–145)
Sodium: 145 mmol/L (ref 135–145)
Sodium: 145 mmol/L (ref 135–145)
Sodium: 145 mmol/L (ref 135–145)
Sodium: 145 mmol/L (ref 135–145)
Sodium: 146 mmol/L — ABNORMAL HIGH (ref 135–145)
TCO2: 37 mmol/L — ABNORMAL HIGH (ref 22–32)
TCO2: 38 mmol/L — ABNORMAL HIGH (ref 22–32)
TCO2: 39 mmol/L — ABNORMAL HIGH (ref 22–32)
TCO2: 41 mmol/L — ABNORMAL HIGH (ref 22–32)
TCO2: 42 mmol/L — ABNORMAL HIGH (ref 22–32)
TCO2: 43 mmol/L — ABNORMAL HIGH (ref 22–32)
pCO2 arterial: 61.9 mmHg — ABNORMAL HIGH (ref 32.0–48.0)
pCO2 arterial: 63.5 mmHg — ABNORMAL HIGH (ref 32.0–48.0)
pCO2 arterial: 64.4 mmHg — ABNORMAL HIGH (ref 32.0–48.0)
pCO2 arterial: 64.7 mmHg — ABNORMAL HIGH (ref 32.0–48.0)
pCO2 arterial: 66.9 mmHg (ref 32.0–48.0)
pCO2 arterial: 70 mmHg (ref 32.0–48.0)
pH, Arterial: 7.347 — ABNORMAL LOW (ref 7.350–7.450)
pH, Arterial: 7.349 — ABNORMAL LOW (ref 7.350–7.450)
pH, Arterial: 7.374 (ref 7.350–7.450)
pH, Arterial: 7.378 (ref 7.350–7.450)
pH, Arterial: 7.391 (ref 7.350–7.450)
pH, Arterial: 7.407 (ref 7.350–7.450)
pO2, Arterial: 104 mmHg (ref 83.0–108.0)
pO2, Arterial: 114 mmHg — ABNORMAL HIGH (ref 83.0–108.0)
pO2, Arterial: 78 mmHg — ABNORMAL LOW (ref 83.0–108.0)
pO2, Arterial: 82 mmHg — ABNORMAL LOW (ref 83.0–108.0)
pO2, Arterial: 87 mmHg (ref 83.0–108.0)
pO2, Arterial: 90 mmHg (ref 83.0–108.0)

## 2020-04-01 LAB — CBC WITH DIFFERENTIAL/PLATELET
Abs Immature Granulocytes: 0.06 10*3/uL (ref 0.00–0.07)
Basophils Absolute: 0 10*3/uL (ref 0.0–0.1)
Basophils Relative: 0 %
Eosinophils Absolute: 1 10*3/uL — ABNORMAL HIGH (ref 0.0–0.5)
Eosinophils Relative: 12 %
HCT: 29 % — ABNORMAL LOW (ref 39.0–52.0)
Hemoglobin: 8.8 g/dL — ABNORMAL LOW (ref 13.0–17.0)
Immature Granulocytes: 1 %
Lymphocytes Relative: 9 %
Lymphs Abs: 0.7 10*3/uL (ref 0.7–4.0)
MCH: 30.9 pg (ref 26.0–34.0)
MCHC: 30.3 g/dL (ref 30.0–36.0)
MCV: 101.8 fL — ABNORMAL HIGH (ref 80.0–100.0)
Monocytes Absolute: 0.6 10*3/uL (ref 0.1–1.0)
Monocytes Relative: 7 %
Neutro Abs: 5.8 10*3/uL (ref 1.7–7.7)
Neutrophils Relative %: 71 %
Platelets: 196 10*3/uL (ref 150–400)
RBC: 2.85 MIL/uL — ABNORMAL LOW (ref 4.22–5.81)
RDW: 17.6 % — ABNORMAL HIGH (ref 11.5–15.5)
WBC: 8.2 10*3/uL (ref 4.0–10.5)
nRBC: 0.5 % — ABNORMAL HIGH (ref 0.0–0.2)

## 2020-04-01 LAB — BASIC METABOLIC PANEL
Anion gap: 11 (ref 5–15)
Anion gap: 9 (ref 5–15)
BUN: 48 mg/dL — ABNORMAL HIGH (ref 6–20)
BUN: 49 mg/dL — ABNORMAL HIGH (ref 6–20)
CO2: 36 mmol/L — ABNORMAL HIGH (ref 22–32)
CO2: 34 mmol/L — ABNORMAL HIGH (ref 22–32)
Calcium: 9.3 mg/dL (ref 8.9–10.3)
Calcium: 9.1 mg/dL (ref 8.9–10.3)
Chloride: 98 mmol/L (ref 98–111)
Chloride: 103 mmol/L (ref 98–111)
Creatinine, Ser: 0.81 mg/dL (ref 0.61–1.24)
Creatinine, Ser: 0.82 mg/dL (ref 0.61–1.24)
GFR, Estimated: >60 mL/min (ref >60)
GFR, Estimated: >60 mL/min (ref >60)
Glucose, Bld: 148 mg/dL — ABNORMAL HIGH (ref 70–99)
Glucose, Bld: 138 mg/dL — ABNORMAL HIGH (ref 70–99)
Potassium: 3.7 mmol/L (ref 3.5–5.1)
Potassium: 3.8 mmol/L (ref 3.5–5.1)
Sodium: 145 mmol/L (ref 135–145)
Sodium: 146 mmol/L — ABNORMAL HIGH (ref 135–145)

## 2020-04-01 LAB — HEPATIC FUNCTION PANEL
ALT: 42 U/L (ref 0–44)
AST: 41 U/L (ref 15–41)
Albumin: 3.7 g/dL (ref 3.5–5.0)
Alkaline Phosphatase: 158 U/L — ABNORMAL HIGH (ref 38–126)
Bilirubin, Direct: 0.4 mg/dL — ABNORMAL HIGH (ref 0.0–0.2)
Indirect Bilirubin: 1.3 mg/dL — ABNORMAL HIGH (ref 0.3–0.9)
Total Bilirubin: 1.7 mg/dL — ABNORMAL HIGH (ref 0.3–1.2)
Total Protein: 6.4 g/dL — ABNORMAL LOW (ref 6.5–8.1)

## 2020-04-01 LAB — ECHOCARDIOGRAM LIMITED
Area-P 1/2: 3.85 cm2
Height: 64 in
S' Lateral: 2.3 cm
Weight: 2218.71 oz

## 2020-04-01 LAB — GLUCOSE, CAPILLARY
Glucose-Capillary: 139 mg/dL — ABNORMAL HIGH (ref 70–99)
Glucose-Capillary: 141 mg/dL — ABNORMAL HIGH (ref 70–99)
Glucose-Capillary: 168 mg/dL — ABNORMAL HIGH (ref 70–99)
Glucose-Capillary: 178 mg/dL — ABNORMAL HIGH (ref 70–99)
Glucose-Capillary: 200 mg/dL — ABNORMAL HIGH (ref 70–99)
Glucose-Capillary: 98 mg/dL (ref 70–99)

## 2020-04-01 LAB — LACTIC ACID, PLASMA
Lactic Acid, Venous: 1.1 mmol/L (ref 0.5–1.9)
Lactic Acid, Venous: 1.3 mmol/L (ref 0.5–1.9)

## 2020-04-01 LAB — APTT
aPTT: 78 seconds — ABNORMAL HIGH (ref 24–36)
aPTT: 89 seconds — ABNORMAL HIGH (ref 24–36)

## 2020-04-01 LAB — CBC
HCT: 27.1 % — ABNORMAL LOW (ref 39.0–52.0)
Hemoglobin: 8.5 g/dL — ABNORMAL LOW (ref 13.0–17.0)
MCH: 31.4 pg (ref 26.0–34.0)
MCHC: 31.4 g/dL (ref 30.0–36.0)
MCV: 100 fL (ref 80.0–100.0)
Platelets: 203 10*3/uL (ref 150–400)
RBC: 2.71 MIL/uL — ABNORMAL LOW (ref 4.22–5.81)
RDW: 17.6 % — ABNORMAL HIGH (ref 11.5–15.5)
WBC: 9 10*3/uL (ref 4.0–10.5)
nRBC: 0.2 % (ref 0.0–0.2)

## 2020-04-01 LAB — PROTIME-INR
INR: 1.6 — ABNORMAL HIGH (ref 0.8–1.2)
Prothrombin Time: 18.9 seconds — ABNORMAL HIGH (ref 11.4–15.2)

## 2020-04-01 LAB — MAGNESIUM: Magnesium: 2.5 mg/dL — ABNORMAL HIGH (ref 1.7–2.4)

## 2020-04-01 LAB — FIBRINOGEN: Fibrinogen: 582 mg/dL — ABNORMAL HIGH (ref 210–475)

## 2020-04-01 LAB — LACTATE DEHYDROGENASE: LDH: 629 U/L — ABNORMAL HIGH (ref 98–192)

## 2020-04-01 MED ORDER — WHITE PETROLATUM EX OINT
TOPICAL_OINTMENT | CUTANEOUS | Status: AC
  Administered 2020-04-01: 0.2
  Filled 2020-04-01: qty 28.35

## 2020-04-01 MED ORDER — ATROPINE SULFATE 1 MG/10ML IJ SOSY
PREFILLED_SYRINGE | INTRAMUSCULAR | Status: AC
  Administered 2020-04-01: 2 mg
  Filled 2020-04-01: qty 10

## 2020-04-01 MED ORDER — CEFAZOLIN SODIUM-DEXTROSE 2-4 GM/100ML-% IV SOLN
2.0000 g | Freq: Three times a day (TID) | INTRAVENOUS | Status: DC
  Administered 2020-04-01 – 2020-04-03 (×7): 2 g via INTRAVENOUS
  Filled 2020-04-01 (×8): qty 100

## 2020-04-01 MED ORDER — METOPROLOL TARTRATE 50 MG PO TABS
75.0000 mg | ORAL_TABLET | Freq: Two times a day (BID) | ORAL | Status: DC
  Administered 2020-04-01: 75 mg
  Filled 2020-04-01: qty 1

## 2020-04-01 MED ORDER — POTASSIUM CHLORIDE 20 MEQ PO PACK
40.0000 meq | PACK | Freq: Once | ORAL | Status: AC
  Administered 2020-04-01: 40 meq
  Filled 2020-04-01: qty 2

## 2020-04-01 NOTE — Progress Notes (Signed)
Nutrition Follow-up  DOCUMENTATION CODES:   Not applicable  INTERVENTION:   Tube Feeding via Cortrak:  Increase Pivot 1.5 to 70 ml/hr Provides 2520 kcals, 158 g of protein and 1275 mL of free water  Increase free water flush to 200 mL q 4 hours: total free water 2475 mL from TF plus free water flush  NUTRITION DIAGNOSIS:   Increased nutrient needs related to acute illness,catabolic illness (UORVI-15 infection) as evidenced by estimated needs.  Being addressed via TF   GOAL:   Patient will meet greater than or equal to 90% of their needs  Met via nutrition support  MONITOR:   Vent status,TF tolerance,Labs,Weight trends  REASON FOR ASSESSMENT:   LOS Enteral/tube feeding initiation and management  ASSESSMENT:   50 y.o. male with no significant medical history. He presented to the ED with dyspnea, cough, and N/V. He reported that his symptoms began with cough 1 week ago. He tried OTC meds but nothing helped. Symptoms worsened to include body aches, fatigue, and N/V 3 days later. He went to his PCP 12/26 and got tested for COVID; he was positive.  12/26 COVID+  12/28 Admitted to [facility] 12/29 Intubated 12/30 Transferred to [facility], VV ECMO cannulation, L PTX with Chest tube insertion 12/31 Cortrak placed, Post-pyloric  1/02 Extubated to HFNC/BiPap as needed, ECHO with EF 60-65% 1/06 TEE for ECMO cannula position, Re-Intubated, Rhinorockets placed for epistaxis, Cortrak malpositioned-repositioned and now gastric per xray 1/07 Trach placed 1/14 Cortrak advanced to post pyloric position 1/16 L. Chest tube removed  Pt remains on VV ECMO x 22 days, sweep at 4. Remains on vent support via trach Anxiety and intermittent delirium continues Working with PT/OT/SLP as able  LUE with ischemic changes; evaluated by [facility] RN today. Covid-induced tissue changes-"covid toes." Left index finger black, other digits with fluid filled bullae. Per RN, some of toe tips on the Left foot are with COVID  tissue changes  Pivot 1.5 at 65 via Cortrak (post-pyloric)  Rectal tube in place. Reglan discontinued yesterday. Ileus resolved. If vomiting resumes, recommend resuming reglan  Current wt 62.6 kg; admit weight 72 kg. Net negative 13 L  Labs: sodium 146 (H), Creatinine wdl, CBGs 166-200 (ICU goal 140-180) Meds: ss niovolog, novolog q 4 hours, levemir, miralax  Diet Order:   Diet Order    None      EDUCATION NEEDS:   Not appropriate for education at this time  Skin:  Skin Assessment: Skin Integrity Issues: Skin Integrity Issues:: Other (Comment) DTI: face-forehead Other: ischemic changes to LUE  Last BM:  1/21 rectal tube  Height:   Ht Readings from Last 1 Encounters:  02/21/2020 '5\' 4"'  (1.626 m)    Weight:   Wt Readings from Last 1 Encounters:  04/02/20 62.6 kg    Ideal Body Weight:  59.1 kg  BMI:  Body mass index is 23.69 kg/m.  Estimated Nutritional Needs:   Kcal:  2160-2520 kcals  Protein:  130-150 g  Fluid:  >/= 2 L   Kerman Passey MS, RDN, LDN, CNSC Registered Dietitian III Clinical Nutrition RD Pager and On-Call Pager Number Located in [place]

## 2020-04-01 NOTE — Progress Notes (Signed)
Klonopin 2mg  pulled from pyxis. Two whole tablets noted to be in single blister pack, totaling 3mg . Extra Klonopin 1mg  wasted in stericycle with , RPH. Ordered 2mg  given to patient.

## 2020-04-01 NOTE — Progress Notes (Signed)
ECMO PROGRESS NOTE  NAME:  Alex [name], MRN:  [id number], DOB:  Jul 23, 1970, LOS: 41 ADMISSION DATE:  02/14/2020, CONSULTATION DATE: 02/19/2020 REFERRING MD: Ander Slade -LBPCCM, CHIEF COMPLAINT: Respiratory failure requiring ECMO  HPI/course in hospital  50 year old man admitted to hospital 12/28 with 1 week history of dyspnea cough nausea and vomiting.  Initially admitted to [facility] hospital and placed on high flow nasal cannula but rapidly failed and required intubation 12/29.  Persistent hypoxic respiratory failure with PF ratio 55 in spite of 18 of PEEP FiO2 0.1 despite paralytics.  Did not improve with prone ventilation  Cannulated for VV ECMO 12/30 via right IJ crescent cannula. Iatrogenic pneumothorax from left subclavian triple-lumen placement  Family confirms no significant past medical history apart from possible prediabetes  Past Medical History  none  Interim history/subjective:  No events, remains tough to keep comfortable and sweep weans limited by WOB. Likewise diuresis attempts resulted in chugging and need for albumin boluses.  Objective   Blood pressure (!) 145/81, pulse (!) 122, temperature 98.6 F (37 C), temperature source Core, resp. rate (!) 37, height _0  (1.626 m), weight 62.9 kg, SpO2 97 %. CVP:  [6 mmHg-20 mmHg] 8 mmHg  Vent Mode: PCV FiO2 (%):  [50 %] 50 % Set Rate:  [20 bmp] 20 bmp PEEP:  [8 cmH20] 8 cmH20   Intake/Output Summary (Last 24 hours) at 04/01/2020 5465 Last data filed at 04/01/2020 0930 Gross per 24 hour  Intake 2683.86 ml  Output 2855 ml  Net -171.14 ml   Filed Weights   03/30/20 0400 03/31/20 0500 04/01/20 0349  Weight: 64.9 kg 62.6 kg 62.9 kg    ECMO Device: Cardiohelp  ECMO Mode: VV  Flow (LPM): 4.44   Examination: Constitutional: less anxious than yesterday  Eyes: EOMI, pupils equal Ears, nose, mouth, and throat: trach in place with small bloody secretions Cardiovascular: tachycardic, ext warm Respiratory:  suprisingly clear Gastrointestinal: soft, +BS, cortrak in place Skin: No rashes, normal turgor Neurologic: moves all 4 ext Psychiatric: seems more calm/sedate today, RASS -3   Net -650 yesterday CXR looks stable Sodium/bicarb improved LDH/fibrinogen stable Pct neg Group F strep in sputum CBC good  Assessment & Plan:  Acute hypoxemic and hypercarbic respiratory failure secondary to severe COVID ARDS, likely PE, and RV dysfunction s/p VV ECMO. Need for sedation with mechanical ventilation Refractory coughing ?Strep F pneumonia - Continue VV support. Monitor usual parameters - Bival PTT 60-80 - Monitor LDH/fibrinogen/circuit clots/delta P - f/u BAL but CXR looks better today - lidocaine/morphine nebs for cough - lasix holiday today, okay for a few doses of metolazone - check limited echo, discussed with Dr. Lynetta Mare, consideration for CTA chest in AM to try to figure out what is driving ongoing high dead space  Anxiety, air hunger, intermittent delirium- ongoing issue complicating sweep weans - Continue PRN dilaudid - Continue standing seroquel (increased 1/18), klonipin (increased 1/19), oxycodone, valproate qHS (started 1/19), ketamine gtt trial (started 1/19)  HTN - Increase metoprolol - Pay attention to other causes of HTN: pain/resp distress/anxiety agitation  Gastroparesis with some element ileus- improved - okay for trial off reglan - postpyloric TF  Ischemic changes L hand- due to pressors and LUE DVT impeding blood flow, stable, monitor, L radial line as been removed; repeat LUE arterial duplex improved - Supportive care, clinical monitoring  DM2 with hyperglycemia- levemir and basal-bolus as ordered, CBG okay  Muscular deconditioning- appreciate PT help up to chair daily, may have to  increase sweep for this  Best practice:  Diet: TF Pain/Anxiety/Delirium protocol (if indicated): see above VAP protocol (if indicated): in place DVT prophylaxis: bival gtt GI  prophylaxis: PPI Glucose control: SSI Mobility: up with PT as tolerated Code Status: full Family Communication: per ecmo team Disposition: ICU   Patient critically ill due to COVID ARDS Interventions to address this today vent titration, sedation titration, ECMO titration Risk of deterioration without these interventions is high  I personally spent 33 minutes providing critical care not including any separately billable procedures  Erskine Emery MD [facility] 04/01/2020 9:52 AM Personal pager: [phone] If unanswered, please page CCM On-call: [phone]

## 2020-04-01 NOTE — Progress Notes (Signed)
  Echocardiogram 2D Echocardiogram has been performed.  Alex [name] 04/01/2020, 9:28 AM

## 2020-04-01 NOTE — Progress Notes (Signed)
ANTICOAGULATION CONSULT NOTE - Follow Up Consult  Pharmacy Consult for bivalirudin Indication: ECMO and VTE  Labs: Recent Labs    03/30/20 0359 03/30/20 0441 03/31/20 0433 03/31/20 0445 03/31/20 1627 03/31/20 2007 03/31/20 2200 04/01/20 0326 04/01/20 0332 04/01/20 0844 04/01/20 1548 04/01/20 1630  HGB 9.8*   < > 9.0*   < > 8.8*   < >  --  8.8*   < > 8.8* 8.8* 8.5*  HCT 29.7*   < > 29.6*   < > 28.7*   < >  --  29.0*   < > 26.0* 26.0* 27.1*  PLT 188   < > 200  --  183  --   --  196  --   --   --  203  APTT 73*   < > 82*  --  92*  --  97* 78*  --   --   --  89*  LABPROT 18.9*  --  19.6*  --   --   --   --  18.9*  --   --   --   --   INR 1.6*  --  1.7*  --   --   --   --  1.6*  --   --   --   --   CREATININE 0.75   < > 0.78  --  0.80  --   --  0.81  --   --   --   --    < > = values in this interval not displayed.    Assessment: 33 yoM admitted with COVID-19 PNA with worsening hypoxia, s/p cannulation for ECMO. Pt was started on IV heparin prior to cannulation due to acute DVTs and possible PE,  transitioned to bivalirudin with ECMO. Now s/p tPA on 1/5 and tracheostomy on 1/6.   APTT supratherapeutic, trended back up to 89 seconds after therapeutic this morning after rate decrease. CBC stable. No bleeding or issues with infusion per discussion with RN.   Goal of Therapy:  aPTT 60-80 seconds   Plan:  Reduce bivalirudin to 0.09 mg/kg/hr (using order-specific wt 72.1kg) Monitor q12h aPTT/CBC, LDH, and for s/sx of bleeding    Leia Alf, PharmD, BCPS Please check AMION for all [facility] contact numbers Clinical Pharmacist 04/01/2020 5:34 PM

## 2020-04-01 NOTE — Progress Notes (Signed)
Attempted to wean ECMO sweep from 4L to 3L. Patient had increased WOB.  MDs notified and sweep changed back to 4L.

## 2020-04-01 NOTE — Progress Notes (Signed)
PT Cancellation Note  Patient Details Name: Alex [name] MRN: [id number] DOB: 12-26-1970   Cancelled Treatment:    Reason Eval/Treat Not Completed: (P) Medical issues which prohibited therapy Pt with increased sedation today, when lightened RR in 50s. Pt's RN and ECMO specialist request hold of therapy today. PT will follow back tomorrow to check appropriateness of treatment.   Hayley Horn B. Beverely Risen PT, DPT Acute Rehabilitation Services Pager [phone] Office [phone]    Elon Alas Fleet 04/01/2020, 12:51 PM

## 2020-04-01 NOTE — Procedures (Signed)
Extracorporeal support note  ECLS cannulation date: 12/30 Last circuit change: n/a  Indication: Severe respiratory failure secondary to COVID-19 pneumonia with RV dysfunction  Configuration: Venovenous  Drainage cannula: 32 French crescent cannula via right IJ Return cannula: Same  Pump speed: 3300 RPM Pump flow: 4.44 L/min Pump used: Cardio help  Oxygenator: Cardio help O2 blender: 100% Sweep gas: 4L  Circuit check: clots at corners of oxygenator L>R, stable Anticoagulant: Bivalirudin Anticoagulation targets: PTT 60-80  Changes in support: limited echo, consideration for CTA chest tomorrow AM depending on results, work on sedation regimen so we can sweep wean but current sweep weans result in extreme tachypnea and accessory muscle use  Anticipated goals/duration of support: Bridge to recovery.  Multidisciplinary ECMO rounds completed.   Lorin Glass, MD 04/01/20 9:50 AM [facility]

## 2020-04-01 NOTE — Plan of Care (Signed)
  Problem: Clinical Measurements: Goal: Diagnostic test results will improve 04/01/2020 1723 by Allene Pyo, RN Outcome: Progressing 04/01/2020 1719 by Allene Pyo, RN Outcome: Progressing   Problem: Coping: Goal: Level of anxiety will decrease 04/01/2020 1723 by Allene Pyo, RN Outcome: Progressing Note: Pt less anxious today, however pt sedation remained same 04/01/2020 1719 by Allene Pyo, RN Outcome: Progressing Note: Pt less anxious today, however pt sedation remained same   Problem: Pain Managment: Goal: General experience of comfort will improve 04/01/2020 1723 by Allene Pyo, RN Outcome: Progressing 04/01/2020 1719 by Allene Pyo, RN Outcome: Progressing   Problem: Elimination: Goal: Will not experience complications related to bowel motility Outcome: Progressing

## 2020-04-01 NOTE — Progress Notes (Signed)
RN took down gauze and xeroform dressing on left hand. Serous blisters remain intact on all fingers. Noted discoloration of left pointer finger, however unchanged from last dressing change, MD aware. Hand remains mottled. ROM performed. Xeroform gauze applied to fingers, wrapped in gauze and ACE bandage. Pt tolerated dressing change well.

## 2020-04-01 NOTE — Progress Notes (Signed)
OT Cancellation Note  Patient Details Name: Rudolf Blizard MRN: [id number] DOB: 1970-10-04   Cancelled Treatment:    Reason Eval/Treat Not Completed: Medical issues which prohibited therapy.  Nursing advised to hold today.  OT to continue efforts as appropriate.    Richard D Popella 04/01/2020, 11:57 AM

## 2020-04-01 NOTE — Progress Notes (Signed)
Patient ID: Alex [name], male   DOB: 08-Jul-1970, 50 y.o.   MRN: [id number]     Advanced Heart Failure Rounding Note  PCP-Cardiologist: No primary care provider on file.   Subjective:    - 12/30: VV ECMO cannulation - 12/31: Left chest tube replaced - 1/2: Extubated. Echo with EF 60-65%, mildly dilated RV with mildly decreased systolic function.  - 1/4: Agitated, suspected aspiration.  Re-intubated.  - 1/5: ECMO cannula repositioned under TEE guidance. TEE showed moderately dilated/moderate-severely dysfunctional RV in setting of hypoxemia. LUE DVT found.  Patient got 1/2 dose of TPA due to initial concern for large PE.  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  - 1/6: Tracheostomy - 1/7: Echo with mild RV dilation/mild RV dysfunction.  - 1/16: Left chest tube out - 1/17: LUE arterial dopplers repeated, showed no obstruction.   Sweep at 3.5 this morning but increased work of breathing again. CXR stable today.  I/Os net negative with Lasix 40 mg IV x 1 + acetazolamide 250.   Working with PT.   ECMO parameters: 3300 rpm Flow 4.38 L/min Pvenous -55 Delta P 27 Sweep 3.5  ABG 7.41/63/114/98% LDH  998 => 1305 => 901 => 1078 => 1,170 => 1,117 => 911 => 751 => 699 => 691 => 737 => 671 => 629 Lactate 1.1 PTT 78  Objective:   Weight Range: 62.9 kg Body mass index is 23.8 kg/m.   Vital Signs:   Temp:  [97.7 F (36.5 C)-98.7 F (37.1 C)] 98.6 F (37 C) (01/20 0400) Pulse Rate:  [92-127] 116 (01/20 0700) Resp:  [15-45] 24 (01/20 0700) BP: (103-206)/(64-96) 117/75 (01/20 0700) SpO2:  [86 %-100 %] 100 % (01/20 0700) Arterial Line BP: (136-255)/(51-95) 155/69 (01/20 0700) FiO2 (%):  [50 %] 50 % (01/20 0400) Weight:  [62.9 kg] 62.9 kg (01/20 0349) Last BM Date: 04/01/20  Weight change: Filed Weights   03/30/20 0400 03/31/20 0500 04/01/20 0349  Weight: 64.9 kg 62.6 kg 62.9 kg    Intake/Output:   Intake/Output Summary (Last 24 hours) at 04/01/2020  0733 Last data filed at 04/01/2020 0612 Gross per 24 hour  Intake 2930.71 ml  Output 3585 ml  Net -654.29 ml      Physical Exam    General: NAD Neck: Tracheostomy. No JVD, no thyromegaly or thyroid nodule.  Lungs: Crackles at bases. CV: Nondisplaced PMI.  Heart mildly tachy, regular S1/S2, no S3/S4, no murmur.  No peripheral edema.   Abdomen: Soft, nontender, no hepatosplenomegaly, no distention.  Skin: Intact without lesions or rashes.  Neurologic: Awake, follows commands Extremities: No clubbing or cyanosis.  HEENT: Normal.    Telemetry   NSR 110s Personally reviewed   Labs    CBC Recent Labs    03/31/20 1627 03/31/20 2007 04/01/20 0326 04/01/20 0332 04/01/20 0551  WBC 7.3  --  8.2  --   --   NEUTROABS  --   --  5.8  --   --   HGB 8.8*   < > 8.8* 9.2* 7.8*  HCT 28.7*   < > 29.0* 27.0* 23.0*  MCV 100.3*  --  101.8*  --   --   PLT 183  --  196  --   --    < > = values in this interval not displayed.   Basic Metabolic Panel Recent Labs    03/31/20 1627 03/31/20 2007 04/01/20 0326 04/01/20 0332 04/01/20 0551  NA 147*   < > 145 145 144  K 3.6   < > 3.7 3.6 3.3*  CL 97*  --  98  --   --   CO2 39*  --  36*  --   --   GLUCOSE 118*  --  148*  --   --   BUN 48*  --  48*  --   --   CREATININE 0.80  --  0.81  --   --   CALCIUM 9.4  --  9.3  --   --    < > = values in this interval not displayed.   Liver Function Tests Recent Labs    03/31/20 0433 04/01/20 0326  AST 31 41  ALT 35 42  ALKPHOS 127* 158*  BILITOT 1.4* 1.7*  PROT 6.3* 6.4*  ALBUMIN 3.8 3.7   No results for input(s): LIPASE, AMYLASE in the last 72 hours. Cardiac Enzymes No results for input(s): CKTOTAL, CKMB, CKMBINDEX, TROPONINI in the last 72 hours.  BNP: BNP (last 3 results) No results for input(s): BNP in the last 8760 hours.  ProBNP (last 3 results) No results for input(s): PROBNP in the last 8760 hours.   D-Dimer No results for input(s): DDIMER in the last 72  hours. Hemoglobin A1C No results for input(s): HGBA1C in the last 72 hours. Fasting Lipid Panel No results for input(s): CHOL, HDL, LDLCALC, TRIG, CHOLHDL, LDLDIRECT in the last 72 hours. Thyroid Function Tests No results for input(s): TSH, T4TOTAL, T3FREE, THYROIDAB in the last 72 hours.  Invalid input(s): FREET3  Other results:   Imaging    DG CHEST PORT 1 VIEW  Result Date: 04/01/2020 CLINICAL DATA:  Tracheostomy, ECMO, COVID EXAM: PORTABLE CHEST 1 VIEW COMPARISON:  03/31/2020 FINDINGS: No significant interval change in AP portable chest radiograph featuring extensive bilateral heterogeneous airspace opacity. Support apparatus including tracheostomy, left subclavian vascular catheter, and veno venous ECMO cannula are unchanged. Heart and mediastinum are unremarkable. IMPRESSION: 1. No significant interval change in AP portable chest radiograph featuring extensive bilateral heterogeneous airspace opacity, consistent with edema, infection, and/or ARDS. 2.  Unchanged support apparatus including ECMO cannula. Electronically Signed   By: Eddie Candle M.D.   On: 04/01/2020 07:31     Medications:     Scheduled Medications: . acetaZOLAMIDE  250 mg Per Tube Daily  . barium  200 mL Oral Once  . chlorhexidine gluconate (MEDLINE KIT)  15 mL Mouth Rinse BID  . Chlorhexidine Gluconate Cloth  6 each Topical Daily  . clonazePAM  2 mg Per Tube Q6H  . cloNIDine  0.2 mg Per Tube Q8H  . docusate  100 mg Per Tube BID  . free water  200 mL Per Tube Q6H  . guaiFENesin-dextromethorphan  10 mL Per Tube BID  . insulin aspart  0-20 Units Subcutaneous Q4H  . insulin aspart  6 Units Subcutaneous Q4H  . insulin detemir  35 Units Subcutaneous BID  . mouth rinse  15 mL Mouth Rinse 10 times per day  . metoCLOPramide (REGLAN) injection  10 mg Intravenous Q6H  . metoprolol tartrate  50 mg Per Tube BID  . oxyCODONE  20 mg Per Tube Q6H  . pantoprazole sodium  40 mg Per Tube QHS  . polyethylene glycol  17 g  Per Tube BID  . QUEtiapine  200 mg Per Tube TID  . sennosides  10 mL Per Tube BID  . sodium chloride flush  10-40 mL Intracatheter Q12H  . valproic acid  250 mg Per Tube QHS    Infusions: .  sodium chloride    . sodium chloride Stopped (03/23/20 0951)  . sodium chloride Stopped (03/26/20 0829)  . sodium chloride Stopped (03/12/20 0131)  . albumin human Stopped (03/31/20 1745)  . bivalirudin (ANGIOMAX) infusion 0.5 mg/mL (Non-ACS indications) 0.11 mg/kg/hr (04/01/20 0612)  . feeding supplement (PIVOT 1.5 CAL) 65 mL/hr at 03/31/20 0700  . fentaNYL infusion INTRAVENOUS Stopped (03/25/20 1549)  . ketamine (KETALAR) Adult IV Infusion 1 mg/kg/hr (04/01/20 0612)    PRN Medications: Place/Maintain arterial line **AND** sodium chloride, sodium chloride, sodium chloride, acetaminophen (TYLENOL) oral liquid 160 mg/5 mL, albumin human, chlorpheniramine-HYDROcodone, guaiFENesin, haloperidol lactate, hydrALAZINE, HYDROmorphone (DILAUDID) injection, labetalol, lidocaine, lip balm, LORazepam, morphine, ondansetron (ZOFRAN) IV, oxyCODONE, sodium chloride flush   Assessment/Plan   1. Acute hypoxemic respiratory failure: Due to COVID-19 PNA with bilateral infiltrates.  Refractory hypoxemia, VV-ECMO cannulation on 03/06/2020 with improvement in oxygenation.  Developed left PTX post-subclavian CVL and had left chest tube, the left lung is re-expanded and CT out.  He was extubated 1/2 but reintubated 1/4 with agitation and suspected aspiration.  Tracheostomy 1/6.  CXR with bilateral infiltrates, stable today but gradually improved over time. ECMO cannula repositioned 1/5.  LDH lower today. Sweep at 3.5 but with increased work of breathing this morning. Suspect significant dead space ventilation.  He is now off abx.  - Will take sweep back up to 4.  - Patient has had remdesivir, tocilizumab. - Completed steroid taper.  - ECMO circuit functioning appropriately. Areas of clotting in oxygenator chronically.  -  Continue bivalirudin, goal PTT 65-80. He is at 15 today.  - No Lasix today, will get 1 dose acetazolamide.  2. RLE DVT/LUE DVT/thrombus in RV/suspect PE: Echo with moderately dilated and moderately dysfunctional RV.  Clot noted on TEE in RV as well.  TTE 1/2 showed normal EF 60-65%, RV improved (mildly dilated/dysfunctional). TEE on 1/5 with moderate to severe RV dysfunction but patient was hypoxemic.  Had 1/2 dose TPA on 1/5. Echo 1/7 with mildly dilated/mildly dysfunctional RV.  - Bivalirudin for goal PTT 65-80.  Management as above 3. Left PTX: Left chest tube, lung is re-expanded. Tube now out, stable CXR. 4. Shock: Suspect septic/distributive.  Now resolved, off NE.  5. Anemia: Hgb 8.8. transfuse < 8.   6. AKI: Resolved.  7. Hyperglycemia: insulin.  8. HTN: Remains elevated, following cuff pressure due to whip in the arterial line.   - On clonidine.  - Continue metoprolol 50 mg bid.  9. CHB: Episode of CHB when hypoxemic.  NSR since then.  Has vagal episodes with prolonged coughing.  - Continue metoprolol, watch rhythm.  10. Thrombocytopenia: Resolved  11. Ileus: Resolved. TFs restarted.  - Getting Reglan  - Cor-trak repositioned to post-pyloric placement 12. Ischemic digits: LUE.  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  Repeat study 1/17 showed no obstruction.   CRITICAL CARE Performed by: Loralie Champagne  Total critical care time: 35 minutes  Critical care time was exclusive of separately billable procedures and treating other patients.  Critical care was necessary to treat or prevent imminent or life-threatening deterioration.  Critical care was time spent personally by me on the following activities: development of treatment plan with patient and/or surrogate as well as nursing, discussions with consultants, evaluation of patient's response to treatment, examination of patient, obtaining history from patient or surrogate, ordering and performing treatments and  interventions, ordering and review of laboratory studies, ordering and review of radiographic studies, pulse oximetry and re-evaluation of patient's condition.  Length of Stay: 95  Loralie Champagne, MD  04/01/2020, 7:33 AM  Advanced Heart Failure Team Pager [phone] (M-F; 7a - 4p)  Please contact [facility] for night-coverage after hours (4p -7a ) and weekends on [email or web address]

## 2020-04-01 NOTE — Progress Notes (Signed)
Pt had severe brady episode with 7-8 second pauses between beats following coughing. Half an amp of Atropine given around 2325. Pts HR responded to the 120-130's then returned to pts baseline.  ECMO flowing well with no suction alarms during brady events. Repeat ABG is WNL.  Spoke to Dr Nicole Cella about above events. Currently RT is administing lidocaine neb and bedside RN to give PRN medication for sedation to limit coughing spells.

## 2020-04-01 NOTE — Plan of Care (Signed)
  Problem: Education: Goal: Knowledge of General Education information will improve Description: Including pain rating scale, medication(s)/side effects and non-pharmacologic comfort measures Outcome: Progressing   Problem: Education: Goal: Knowledge of risk factors and measures for prevention of condition will improve Outcome: Progressing   Problem: Coping: Goal: Psychosocial and spiritual needs will be supported Outcome: Progressing   Problem: Respiratory: Goal: Will maintain a patent airway Outcome: Progressing Goal: Complications related to the disease process, condition or treatment will be avoided or minimized Outcome: Progressing   Problem: Activity: Goal: Ability to tolerate increased activity will improve Outcome: Progressing   Problem: Respiratory: Goal: Ability to maintain a clear airway and adequate ventilation will improve Outcome: Progressing   Problem: Role Relationship: Goal: Method of communication will improve Outcome: Progressing   Problem: Health Behavior/Discharge Planning: Goal: Ability to manage health-related needs will improve Outcome: Progressing   Problem: Clinical Measurements: Goal: Ability to maintain clinical measurements within normal limits will improve Outcome: Progressing Goal: Will remain free from infection Outcome: Progressing Goal: Diagnostic test results will improve Outcome: Progressing Goal: Respiratory complications will improve Outcome: Progressing Goal: Cardiovascular complication will be avoided Outcome: Progressing   Problem: Activity: Goal: Risk for activity intolerance will decrease Outcome: Progressing   Problem: Coping: Goal: Level of anxiety will decrease Outcome: Progressing   Problem: Pain Managment: Goal: General experience of comfort will improve Outcome: Progressing

## 2020-04-01 NOTE — Progress Notes (Signed)
SLP Cancellation Note  Patient Details Name: Alex [name] MRN: [id number] DOB: 1970-09-09   Cancelled treatment:       Reason Eval/Treat Not Completed: Medical issues which prohibited therapy. Chart reviewed and discussed pt with RN/RT. Pt continues to require sedation today. He just had an ECHO and may also be needing other testing today. They advice holding for today. Will continue efforts.    Mahala Menghini., M.A. CCC-SLP Acute Rehabilitation Services Pager [phone] Office [phone]  04/01/2020, 10:38 AM

## 2020-04-02 ENCOUNTER — Inpatient Hospital Stay (HOSPITAL_COMMUNITY): Payer: Medicaid Other

## 2020-04-02 LAB — TYPE AND SCREEN
ABO/RH(D): B POS
Antibody Screen: NEGATIVE
Unit division: 0
Unit division: 0
Unit division: 0
Unit division: 0

## 2020-04-02 LAB — POCT I-STAT 7, (LYTES, BLD GAS, ICA,H+H)
Acid-Base Excess: 9 mmol/L — ABNORMAL HIGH (ref 0.0–2.0)
Acid-Base Excess: 7 mmol/L — ABNORMAL HIGH (ref 0.0–2.0)
Acid-Base Excess: 8 mmol/L — ABNORMAL HIGH (ref 0.0–2.0)
Acid-Base Excess: 8 mmol/L — ABNORMAL HIGH (ref 0.0–2.0)
Acid-Base Excess: 7 mmol/L — ABNORMAL HIGH (ref 0.0–2.0)
Acid-Base Excess: 5 mmol/L — ABNORMAL HIGH (ref 0.0–2.0)
Acid-Base Excess: 4 mmol/L — ABNORMAL HIGH (ref 0.0–2.0)
Bicarbonate: 36 mmol/L — ABNORMAL HIGH (ref 20.0–28.0)
Bicarbonate: 37.4 mmol/L — ABNORMAL HIGH (ref 20.0–28.0)
Bicarbonate: 34.9 mmol/L — ABNORMAL HIGH (ref 20.0–28.0)
Bicarbonate: 34 mmol/L — ABNORMAL HIGH (ref 20.0–28.0)
Bicarbonate: 32.1 mmol/L — ABNORMAL HIGH (ref 20.0–28.0)
Bicarbonate: 30.8 mmol/L — ABNORMAL HIGH (ref 20.0–28.0)
Bicarbonate: 31 mmol/L — ABNORMAL HIGH (ref 20.0–28.0)
Calcium, Ion: 1.29 mmol/L (ref 1.15–1.40)
Calcium, Ion: 1.29 mmol/L (ref 1.15–1.40)
Calcium, Ion: 1.25 mmol/L (ref 1.15–1.40)
Calcium, Ion: 1.26 mmol/L (ref 1.15–1.40)
Calcium, Ion: 1.25 mmol/L (ref 1.15–1.40)
Calcium, Ion: 1.24 mmol/L (ref 1.15–1.40)
Calcium, Ion: 1.28 mmol/L (ref 1.15–1.40)
HCT: 25 % — ABNORMAL LOW (ref 39.0–52.0)
HCT: 32 % — ABNORMAL LOW (ref 39.0–52.0)
HCT: 28 % — ABNORMAL LOW (ref 39.0–52.0)
HCT: 27 % — ABNORMAL LOW (ref 39.0–52.0)
HCT: 27 % — ABNORMAL LOW (ref 39.0–52.0)
HCT: 25 % — ABNORMAL LOW (ref 39.0–52.0)
HCT: 29 % — ABNORMAL LOW (ref 39.0–52.0)
Hemoglobin: 8.5 g/dL — ABNORMAL LOW (ref 13.0–17.0)
Hemoglobin: 10.9 g/dL — ABNORMAL LOW (ref 13.0–17.0)
Hemoglobin: 9.5 g/dL — ABNORMAL LOW (ref 13.0–17.0)
Hemoglobin: 9.2 g/dL — ABNORMAL LOW (ref 13.0–17.0)
Hemoglobin: 9.2 g/dL — ABNORMAL LOW (ref 13.0–17.0)
Hemoglobin: 8.5 g/dL — ABNORMAL LOW (ref 13.0–17.0)
Hemoglobin: 9.9 g/dL — ABNORMAL LOW (ref 13.0–17.0)
O2 Saturation: 97 %
O2 Saturation: 93 %
O2 Saturation: 93 %
O2 Saturation: 95 %
O2 Saturation: 92 %
O2 Saturation: 97 %
O2 Saturation: 94 %
Patient temperature: 37
Patient temperature: 98.4
Patient temperature: 37
Patient temperature: 37
Patient temperature: 37
Potassium: 3.9 mmol/L (ref 3.5–5.1)
Potassium: 4.6 mmol/L (ref 3.5–5.1)
Potassium: 4.2 mmol/L (ref 3.5–5.1)
Potassium: 3.8 mmol/L (ref 3.5–5.1)
Potassium: 4.5 mmol/L (ref 3.5–5.1)
Potassium: 4.1 mmol/L (ref 3.5–5.1)
Potassium: 4.7 mmol/L (ref 3.5–5.1)
Sodium: 146 mmol/L — ABNORMAL HIGH (ref 135–145)
Sodium: 146 mmol/L — ABNORMAL HIGH (ref 135–145)
Sodium: 145 mmol/L (ref 135–145)
Sodium: 146 mmol/L — ABNORMAL HIGH (ref 135–145)
Sodium: 145 mmol/L (ref 135–145)
Sodium: 146 mmol/L — ABNORMAL HIGH (ref 135–145)
Sodium: 146 mmol/L — ABNORMAL HIGH (ref 135–145)
TCO2: 38 mmol/L — ABNORMAL HIGH (ref 22–32)
TCO2: 40 mmol/L — ABNORMAL HIGH (ref 22–32)
TCO2: 37 mmol/L — ABNORMAL HIGH (ref 22–32)
TCO2: 36 mmol/L — ABNORMAL HIGH (ref 22–32)
TCO2: 34 mmol/L — ABNORMAL HIGH (ref 22–32)
TCO2: 32 mmol/L (ref 22–32)
TCO2: 33 mmol/L — ABNORMAL HIGH (ref 22–32)
pCO2 arterial: 65.1 mmHg (ref 32.0–48.0)
pCO2 arterial: 89.4 mmHg (ref 32.0–48.0)
pCO2 arterial: 61.8 mmHg — ABNORMAL HIGH (ref 32.0–48.0)
pCO2 arterial: 51.8 mmHg — ABNORMAL HIGH (ref 32.0–48.0)
pCO2 arterial: 49.3 mmHg — ABNORMAL HIGH (ref 32.0–48.0)
pCO2 arterial: 48.2 mmHg — ABNORMAL HIGH (ref 32.0–48.0)
pCO2 arterial: 56.5 mmHg — ABNORMAL HIGH (ref 32.0–48.0)
pH, Arterial: 7.35 (ref 7.350–7.450)
pH, Arterial: 7.229 — ABNORMAL LOW (ref 7.350–7.450)
pH, Arterial: 7.36 (ref 7.350–7.450)
pH, Arterial: 7.425 (ref 7.350–7.450)
pH, Arterial: 7.422 (ref 7.350–7.450)
pH, Arterial: 7.412 (ref 7.350–7.450)
pH, Arterial: 7.347 — ABNORMAL LOW (ref 7.350–7.450)
pO2, Arterial: 97 mmHg (ref 83.0–108.0)
pO2, Arterial: 84 mmHg (ref 83.0–108.0)
pO2, Arterial: 73 mmHg — ABNORMAL LOW (ref 83.0–108.0)
pO2, Arterial: 73 mmHg — ABNORMAL LOW (ref 83.0–108.0)
pO2, Arterial: 65 mmHg — ABNORMAL LOW (ref 83.0–108.0)
pO2, Arterial: 90 mmHg (ref 83.0–108.0)
pO2, Arterial: 75 mmHg — ABNORMAL LOW (ref 83.0–108.0)

## 2020-04-02 LAB — GLUCOSE, CAPILLARY
Glucose-Capillary: 187 mg/dL — ABNORMAL HIGH (ref 70–99)
Glucose-Capillary: 166 mg/dL — ABNORMAL HIGH (ref 70–99)
Glucose-Capillary: 224 mg/dL — ABNORMAL HIGH (ref 70–99)
Glucose-Capillary: 159 mg/dL — ABNORMAL HIGH (ref 70–99)
Glucose-Capillary: 141 mg/dL — ABNORMAL HIGH (ref 70–99)
Glucose-Capillary: 202 mg/dL — ABNORMAL HIGH (ref 70–99)

## 2020-04-02 LAB — CBC
HCT: 26.7 % — ABNORMAL LOW (ref 39.0–52.0)
HCT: 29.5 % — ABNORMAL LOW (ref 39.0–52.0)
Hemoglobin: 7.9 g/dL — ABNORMAL LOW (ref 13.0–17.0)
Hemoglobin: 9 g/dL — ABNORMAL LOW (ref 13.0–17.0)
MCH: 29.9 pg (ref 26.0–34.0)
MCH: 29.3 pg (ref 26.0–34.0)
MCHC: 29.6 g/dL — ABNORMAL LOW (ref 30.0–36.0)
MCHC: 30.5 g/dL (ref 30.0–36.0)
MCV: 101.1 fL — ABNORMAL HIGH (ref 80.0–100.0)
MCV: 96.1 fL (ref 80.0–100.0)
Platelets: 202 10*3/uL (ref 150–400)
Platelets: 213 10*3/uL (ref 150–400)
RBC: 2.64 MIL/uL — ABNORMAL LOW (ref 4.22–5.81)
RBC: 3.07 MIL/uL — ABNORMAL LOW (ref 4.22–5.81)
RDW: 17.8 % — ABNORMAL HIGH (ref 11.5–15.5)
RDW: 19.9 % — ABNORMAL HIGH (ref 11.5–15.5)
WBC: 9.1 10*3/uL (ref 4.0–10.5)
WBC: 10.9 10*3/uL — ABNORMAL HIGH (ref 4.0–10.5)
nRBC: 0.7 % — ABNORMAL HIGH (ref 0.0–0.2)
nRBC: 0.7 % — ABNORMAL HIGH (ref 0.0–0.2)

## 2020-04-02 LAB — BASIC METABOLIC PANEL
Anion gap: 9 (ref 5–15)
Anion gap: 10 (ref 5–15)
BUN: 50 mg/dL — ABNORMAL HIGH (ref 6–20)
BUN: 52 mg/dL — ABNORMAL HIGH (ref 6–20)
CO2: 32 mmol/L (ref 22–32)
CO2: 31 mmol/L (ref 22–32)
Calcium: 8.9 mg/dL (ref 8.9–10.3)
Calcium: 8.9 mg/dL (ref 8.9–10.3)
Chloride: 105 mmol/L (ref 98–111)
Chloride: 104 mmol/L (ref 98–111)
Creatinine, Ser: 0.8 mg/dL (ref 0.61–1.24)
Creatinine, Ser: 0.86 mg/dL (ref 0.61–1.24)
GFR, Estimated: >60 mL/min (ref >60)
GFR, Estimated: >60 mL/min (ref >60)
Glucose, Bld: 194 mg/dL — ABNORMAL HIGH (ref 70–99)
Glucose, Bld: 161 mg/dL — ABNORMAL HIGH (ref 70–99)
Potassium: 3.9 mmol/L (ref 3.5–5.1)
Potassium: 3.8 mmol/L (ref 3.5–5.1)
Sodium: 146 mmol/L — ABNORMAL HIGH (ref 135–145)
Sodium: 145 mmol/L (ref 135–145)

## 2020-04-02 LAB — BPAM RBC
Blood Product Expiration Date: 202202042359
Blood Product Expiration Date: 202202092359
Blood Product Expiration Date: 202202092359
Blood Product Expiration Date: 202202092359
Unit Type and Rh: 7300
Unit Type and Rh: 7300
Unit Type and Rh: 7300
Unit Type and Rh: 7300

## 2020-04-02 LAB — PROTIME-INR
INR: 1.6 — ABNORMAL HIGH (ref 0.8–1.2)
Prothrombin Time: 18.6 seconds — ABNORMAL HIGH (ref 11.4–15.2)

## 2020-04-02 LAB — LACTATE DEHYDROGENASE: LDH: 649 U/L — ABNORMAL HIGH (ref 98–192)

## 2020-04-02 LAB — PREPARE RBC (CROSSMATCH)

## 2020-04-02 LAB — APTT
aPTT: 76 seconds — ABNORMAL HIGH (ref 24–36)
aPTT: 83 seconds — ABNORMAL HIGH (ref 24–36)

## 2020-04-02 LAB — LACTIC ACID, PLASMA
Lactic Acid, Venous: 1 mmol/L (ref 0.5–1.9)
Lactic Acid, Venous: 1.1 mmol/L (ref 0.5–1.9)

## 2020-04-02 LAB — FIBRINOGEN: Fibrinogen: 666 mg/dL — ABNORMAL HIGH (ref 210–475)

## 2020-04-02 MED ORDER — NUTRISOURCE FIBER PO PACK
1.0000 | PACK | Freq: Two times a day (BID) | ORAL | Status: DC
  Administered 2020-04-02 – 2020-05-05 (×64): 1
  Filled 2020-04-02 (×69): qty 1

## 2020-04-02 MED ORDER — FREE WATER
200.0000 mL | Status: DC
  Administered 2020-04-02 – 2020-04-06 (×23): 200 mL

## 2020-04-02 MED ORDER — SIMETHICONE 40 MG/0.6ML PO SUSP
40.0000 mg | Freq: Four times a day (QID) | ORAL | Status: DC | PRN
  Filled 2020-04-02: qty 0.6

## 2020-04-02 MED ORDER — CHLORHEXIDINE GLUCONATE 0.12 % MT SOLN
OROMUCOSAL | Status: AC
  Administered 2020-04-02: 15 mL via OROMUCOSAL
  Filled 2020-04-02: qty 15

## 2020-04-02 MED ORDER — PIVOT 1.5 CAL PO LIQD
1000.0000 mL | ORAL | Status: DC
  Administered 2020-04-02 – 2020-04-26 (×24): 1000 mL
  Filled 2020-04-02: qty 1000

## 2020-04-02 MED ORDER — NITROGLYCERIN 2 % TD OINT
0.5000 [in_us] | TOPICAL_OINTMENT | Freq: Four times a day (QID) | TRANSDERMAL | Status: DC
  Administered 2020-04-02 – 2020-04-13 (×44): 0.5 [in_us] via TOPICAL
  Filled 2020-04-02 (×2): qty 30

## 2020-04-02 MED ORDER — POTASSIUM CHLORIDE 20 MEQ PO PACK
40.0000 meq | PACK | Freq: Once | ORAL | Status: AC
  Administered 2020-04-02: 40 meq
  Filled 2020-04-02: qty 2

## 2020-04-02 MED ORDER — POLYETHYLENE GLYCOL 3350 17 G PO PACK
17.0000 g | PACK | Freq: Every day | ORAL | Status: DC | PRN
  Administered 2020-04-06 – 2020-04-12 (×2): 17 g
  Filled 2020-04-02 (×3): qty 1

## 2020-04-02 MED ORDER — POTASSIUM CHLORIDE 20 MEQ PO PACK
20.0000 meq | PACK | Freq: Once | ORAL | Status: AC
  Administered 2020-04-02: 20 meq
  Filled 2020-04-02: qty 1

## 2020-04-02 MED ORDER — METOPROLOL TARTRATE 50 MG PO TABS
50.0000 mg | ORAL_TABLET | Freq: Two times a day (BID) | ORAL | Status: DC
  Administered 2020-04-02 – 2020-04-08 (×13): 50 mg
  Filled 2020-04-02 (×13): qty 1

## 2020-04-02 MED ORDER — FUROSEMIDE 10 MG/ML IJ SOLN
40.0000 mg | Freq: Once | INTRAMUSCULAR | Status: AC
  Administered 2020-04-02: 40 mg via INTRAVENOUS
  Filled 2020-04-02: qty 4

## 2020-04-02 NOTE — Consult Note (Signed)
WOC Nurse Consult Note: Patient receiving care in [facility] [phone]. Primary RNs present at time of my assessment. Reason for Consult: left hand bullae Wound type: Patient has Covid induced tissue changes all fingers on left hand. Left index finger tip is dry and black. Others have fluid filled bullae. Some of the toe tips on the left foot are also becoming impacted by Covid tissue changes--commonly referred to as "Covid toes" Pressure Injury POA: Yes/No/NA Measurement: Wound bed: Drainage (amount, consistency, odor)  Periwound: Dressing procedure/placement/frequency: Each finger of the left hand was individually wrapped with Xeroform and gauze.  The only change I made was to pull back the Xeroform from the tip of the index finger.   I have entered an order for application of iodine from the swabsticks or swab pads from clean utility to all dry, black fingers or toes.  Allow to air dry.  Existing Nursing care instruction for Xeroform was slightly modified.  Monitor the wound area(s) for worsening of condition such as: Signs/symptoms of infection,  Increase in size,  Development of or worsening of odor, Development of pain, or increased pain at the affected locations.  Notify the medical team if any of these develop.  Thank you for the consult.  Discussed plan of care with the bedside nurses.  WOC nurse will not follow at this time.  Please re-consult the WOC team if needed.  Helmut Muster, RN, MSN, CWOCN, CNS-BC, pager [phone]

## 2020-04-02 NOTE — Progress Notes (Signed)
ECMO PROGRESS NOTE  NAME:  Alex [name], MRN:  [id number], DOB:  04/14/1970, LOS: 24 ADMISSION DATE:  03/08/2020, CONSULTATION DATE: 03/21/2020 REFERRING MD: Wynona Neat -LBPCCM, CHIEF COMPLAINT: Respiratory failure requiring ECMO  HPI/course in hospital  50 year old man admitted to hospital 12/28 with 1 week history of dyspnea cough nausea and vomiting.  Initially admitted to [facility] long hospital and placed on high flow nasal cannula but rapidly failed and required intubation 12/29.  Persistent hypoxic respiratory failure with PF ratio 55 in spite of 18 of PEEP FiO2 0.1 despite paralytics.  Did not improve with prone ventilation  Cannulated for VV ECMO 12/30 via right IJ crescent cannula. Iatrogenic pneumothorax from left subclavian triple-lumen placement  Family confirms no significant past medical history apart from possible prediabetes  Past Medical History  none  Interim history/subjective:  Continues to have difficult to control anxiety. Thick secretions.  Objective   Blood pressure 127/73, pulse (!) 118, temperature 97.7 F (36.5 C), temperature source Axillary, resp. rate (!) 28, height 5\' 4"  (1.626 m), weight 62.6 kg, SpO2 94 %.    Vent Mode: PCV FiO2 (%):  [50 %] 50 % Set Rate:  [20 bmp] 20 bmp PEEP:  [8 cmH20-10 cmH20] 10 cmH20 Plateau Pressure:  [14 cmH20] 14 cmH20   Intake/Output Summary (Last 24 hours) at 04/02/2020 1811 Last data filed at 04/02/2020 1700 Gross per 24 hour  Intake 3906.52 ml  Output 2630 ml  Net 1276.52 ml   Filed Weights   03/31/20 0500 04/01/20 0349 04/02/20 0500  Weight: 62.6 kg 62.9 kg 62.6 kg    ECMO Device: Cardiohelp  ECMO Mode: VV  Flow (LPM): 4.31   Examination: Constitutional: Continued anxiety Eyes: EOMI, pupils equal Ears, nose, mouth, and throat: trach in place with small bloody secretions Cardiovascular: tachycardic, ext warm Respiratory: crackles at bases L>R  Gastrointestinal: soft, +BS, cortrak in place Skin: No  rashes, normal turgor, line site intact. Blistering left hand.  Neurologic: moves all 4 ext  Psychiatric:  more calm/sedate today, RASS -3   Assessment & Plan:  Acute hypoxemic and hypercarbic respiratory failure secondary to severe COVID ARDS, likely PE, and RV dysfunction s/p VV ECMO. Need for sedation with mechanical ventilation Refractory coughing ?Strep F pneumonia - Continue VV support. Monitor usual parameters - Bival PTT 60-80 - Monitor LDH/fibrinogen/circuit clots/delta P - lidocaine/morphine nebs for cough   Anxiety, air hunger, intermittent delirium- ongoing issue complicating sweep weans - Continue PRN dilaudid - Continue standing seroquel (increased 1/18), klonipin (increased 1/19), oxycodone, valproate qHS (started 1/19), ketamine gtt trial (started 1/19)  HTN - Increase metoprolol - Pay attention to other causes of HTN: pain/resp distress/anxiety agitation  Gastroparesis with some element ileus- improved - okay for trial off reglan - postpyloric TF  Ischemic changes L hand- due to pressors and LUE DVT impeding blood flow, stable, monitor, L radial line as been removed; repeat LUE arterial duplex improved - Supportive care, clinical monitoring  DM2 with hyperglycemia- levemir and basal-bolus as ordered, CBG okay  Muscular deconditioning- appreciate PT help up to chair daily, may have to increase sweep for this  Best practice:  Diet: TF Pain/Anxiety/Delirium protocol (if indicated): see above VAP protocol (if indicated): in place DVT prophylaxis: bival gtt GI prophylaxis: PPI Glucose control: SSI Mobility: up with PT as tolerated Code Status: full Family Communication: per ecmo team Disposition: ICU   Patient critically ill due to COVID ARDS Interventions to address this today vent titration, sedation titration, ECMO titration Risk  of deterioration without these interventions is high   CRITICAL CARE Performed by: Lynnell Catalan   Total critical  care time: 40 minutes  Critical care time was exclusive of separately billable procedures and treating other patients.  Critical care was necessary to treat or prevent imminent or life-threatening deterioration.  Critical care was time spent personally by me on the following activities: development of treatment plan with patient and/or surrogate as well as nursing, discussions with consultants, evaluation of patient's response to treatment, examination of patient, obtaining history from patient or surrogate, ordering and performing treatments and interventions, ordering and review of laboratory studies, ordering and review of radiographic studies, pulse oximetry, re-evaluation of patient's condition and participation in multidisciplinary rounds.  Lynnell Catalan, MD [facility] ICU Physician [facility]  Pager: [phone] Mobile: [phone] After hours: [phone].

## 2020-04-02 NOTE — Progress Notes (Signed)
Occupational Therapy Treatment Patient Details Name: Alex [name] MRN: [id number] DOB: 01-Sep-1970 Today's Date: 04/02/2020    History of present illness 50 y.o. male  with no significant past medical history admitted on 03/06/2020 with dyspnea, cough, nausea/vomiting ~ 1 week ago with worsening symptoms of body aches and fatigue and +COVID 02/07/20 and admitted with shortness of breath. Required intubation 03/10/20. Cannulated for VV ECMO 03/28/2020. Oxygenating better with ECMO. Also evidence of RLE DVT, RV thrombus, and high suspicion of PE. Has required chest tube to L lung for collapse which needs further reposition with recurrent collapse. Extubated 03-15-19.  Reintubated 1/5 and trach placed 1/6.   OT comments  Patient seen this date for bed mobility, positioning and attempted AROM to his upper extremities.  Patient continues with a mild sedation, difficult to keep awake throughout the session.  OT to continue efforts, as sedation is further weaned as appropriate, will again progress to sitting edge of be, and sitting in the recliner.  CIR continues to be recommended when appropriate.    Follow Up Recommendations  CIR    Equipment Recommendations  None recommended by OT    Recommendations for Other Services      Precautions / Restrictions Precautions Precaution Comments: ECMO, Vent, rectal tube, Foley       Mobility Bed Mobility Overal bed mobility: Needs Assistance   Rolling: +2 for physical assistance;Max assist            Transfers                                                                 ADL either performed or assessed with clinical judgement   ADL Overall ADL's : Needs assistance/impaired Eating/Feeding: NPO   Grooming: Wash/dry hands;Wash/dry face;Maximal assistance   Upper Body Bathing: Total assistance;Bed level   Lower Body Bathing: Total assistance;Bed level   Upper Body Dressing : Total assistance;Bed level   Lower  Body Dressing: Total assistance;Bed level       Toileting- Clothing Manipulation and Hygiene: Bed level;Total assistance;+2 for physical assistance                                 Cognition Arousal/Alertness: Lethargic   Overall Cognitive Status: Difficult to assess                                          Exercises General Exercises - Upper Extremity Shoulder Flexion: PROM;Both;Supine;10 reps Shoulder ABduction: PROM;Both;10 reps;Supine Elbow Flexion: PROM;Both;Supine;10 reps Elbow Extension: PROM;Both;Supine;10 reps Wrist Flexion: PROM;Both;10 reps;Supine Wrist Extension: PROM;Both;10 reps;Supine Digit Composite Flexion: PROM;Both;10 reps;Supine Composite Extension: PROM;Both;10 reps;Supine    Prior Functioning/Environment              Frequency  Min 2X/week        Progress Toward Goals  OT Goals(current goals can now be found in the care plan section)     Acute Rehab OT Goals Patient Stated Goal: unable to state OT Goal Formulation: Patient unable to participate in goal setting Time For Goal Achievement: 04/16/20 Potential to Achieve Goals: Fair  Plan Discharge plan remains appropriate  Co-evaluation                 AM-PAC OT "6 Clicks" Daily Activity     Outcome Measure   Help from another person eating meals?: Total Help from another person taking care of personal grooming?: Total Help from another person toileting, which includes using toliet, bedpan, or urinal?: Total Help from another person bathing (including washing, rinsing, drying)?: Total Help from another person to put on and taking off regular upper body clothing?: Total Help from another person to put on and taking off regular lower body clothing?: Total 6 Click Score: 6    End of Session Equipment Utilized During Treatment: Oxygen  OT Visit Diagnosis: Muscle weakness (generalized) (M62.81)   Activity Tolerance Patient limited by lethargy    Patient Left with call bell/phone within reach;with nursing/sitter in room;in bed   Nurse Communication Need for lift equipment        Time: 8891-6945 OT Time Calculation (min): 9 min  Charges: OT General Charges $OT Visit: 1 Visit OT Treatments $Therapeutic Exercise: 8-22 mins  04/02/2020  Rich, OTR/L  Acute Rehabilitation Services  Office:  [phone]    Suzanna Obey 04/02/2020, 12:47 PM

## 2020-04-02 NOTE — Progress Notes (Signed)
SLP Cancellation Note  Patient Details Name: Alex [name] MRN: [id number] DOB: June 08, 1970   Cancelled treatment:       Reason Eval/Treat Not Completed: Fatigue/lethargy limiting ability to participate. Pt reports that pt remains too somnolent for inline PMV trials today and recommends following up next week instead. Will continue efforts.    Mahala Menghini., M.A. CCC-SLP Acute Rehabilitation Services Pager [phone] Office [phone]  04/02/2020, 12:08 PM

## 2020-04-02 NOTE — Progress Notes (Signed)
PT Cancellation Note  Patient Details Name: Alex [name] MRN: [id number] DOB: [birth year]   Cancelled Treatment:    Reason Eval/Treat Not Completed: (P) Medical issues which prohibited therapy Pt continues sedation to help control elevated RR. RN giving meds and request follow back later. PT will follow back as able.  Alex [name] PT, DPT Acute Rehabilitation Services Pager [phone] Office [phone]    Elon Alas Fleet 04/02/2020, 10:20 AM

## 2020-04-02 NOTE — Procedures (Signed)
Extracorporeal support note  ECLS cannulation date: 12/30  Support day 23 Last circuit change: n/a  Indication: Severe respiratory failure secondary to COVID-19 pneumonia with RV dysfunction  Configuration: Venovenous  Drainage cannula: 32 French crescent cannula via right IJ Return cannula: Same  Pump speed: 3300 RPM Pump flow: 4.44 L/min Pump used: Cardio help  Oxygenator: Cardio help O2 blender: 100% Sweep gas: 4L  Circuit check: clots at corners of oxygenator L>R, stable Anticoagulant: Bivalirudin Anticoagulation targets: PTT 60-80  Changes in support: limited echo, consideration for CTA chest tomorrow AM depending on results, work on sedation regimen so we can sweep wean but current sweep weans result in extreme tachypnea and accessory muscle use  Anticipated goals/duration of support: Bridge to recovery.  Multidisciplinary ECMO rounds completed.   Lynnell Catalan, MD 04/02/20 6:10 PM [facility]

## 2020-04-02 NOTE — Progress Notes (Signed)
Patient ID: Alex [name], male   DOB: 11/07/1970, 50 y.o.   MRN: [id number]     Advanced Heart Failure Rounding Note  PCP-Cardiologist: No primary care provider on file.   Subjective:    - 12/30: VV ECMO cannulation - 12/31: Left chest tube replaced - 1/2: Extubated. Echo with EF 60-65%, mildly dilated RV with mildly decreased systolic function.  - 1/4: Agitated, suspected aspiration.  Re-intubated.  - 1/5: ECMO cannula repositioned under TEE guidance. TEE showed moderately dilated/moderate-severely dysfunctional RV in setting of hypoxemia. LUE DVT found.  Patient got 1/2 dose of TPA due to initial concern for large PE.  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  - 1/6: Tracheostomy - 1/7: Echo with mild RV dilation/mild RV dysfunction.  - 1/16: Left chest tube out - 1/17: LUE arterial dopplers repeated, showed no obstruction.  - 1/20: Echo with EF 65-70%, mildly D-shaped septum, mildly dilated and mildly dysfunctional RV.   Sweep at 4 this morning, breathing comfortably but still with PaCO2 65 (compensated).    Ancef started for Group F Strep in trach aspirate.   No Lasix yesterday, I/Os +1301.    1 unit PRBCs overnight.   ECMO parameters: 3300 rpm Flow 4.37 L/min Pvenous -69 Delta P 27 Sweep 4  ABG 7.35/65/97/97% LDH  998 => 1305 => 901 => 1078 => 1,170 => 1,117 => 911 => 751 => 699 => 691 => 737 => 671 => 629 => 649 Lactate 1.0 PTT 76  Objective:   Weight Range: 62.6 kg Body mass index is 23.69 kg/m.   Vital Signs:   Temp:  [98 F (36.7 C)-98.6 F (37 C)] 98 F (36.7 C) (01/21 0448) Pulse Rate:  [95-130] 112 (01/21 0700) Resp:  [18-44] 28 (01/21 0700) BP: (91-172)/(54-123) 118/70 (01/21 0700) SpO2:  [97 %-100 %] 100 % (01/21 0700) Arterial Line BP: (104-201)/(44-83) 145/60 (01/21 0700) FiO2 (%):  [50 %] 50 % (01/21 0430) Weight:  [62.6 kg] 62.6 kg (01/21 0500) Last BM Date: 04/01/20  Weight change: Filed Weights   03/31/20 0500  04/01/20 0349 04/02/20 0500  Weight: 62.6 kg 62.9 kg 62.6 kg    Intake/Output:   Intake/Output Summary (Last 24 hours) at 04/02/2020 0803 Last data filed at 04/02/2020 0800 Gross per 24 hour  Intake 3972.2 ml  Output 2895 ml  Net 1077.2 ml      Physical Exam    General: NAD Neck: Tracheostomy. No JVD, no thyromegaly or thyroid nodule.  Lungs: Decreased at bases.  CV: Nondisplaced PMI.  Heart mildly tachy regular S1/S2, no S3/S4, no murmur.  No peripheral edema.   Abdomen: Soft, nontender, no hepatosplenomegaly, no distention.  Skin: Intact without lesions or rashes.  Neurologic: Wakes up and follow commands.  Extremities: No clubbing or cyanosis.  HEENT: Normal.    Telemetry   NSR 110s Personally reviewed   Labs    CBC Recent Labs    04/01/20 0326 04/01/20 0332 04/01/20 1630 04/01/20 1958 04/02/20 0347 04/02/20 0350  WBC 8.2  --  9.0  --  9.1  --   NEUTROABS 5.8  --   --   --   --   --   HGB 8.8*   < > 8.5*   < > 7.9* 8.5*  HCT 29.0*   < > 27.1*   < > 26.7* 25.0*  MCV 101.8*  --  100.0  --  101.1*  --   PLT 196  --  203  --  202  --    < > =  values in this interval not displayed.   Basic Metabolic Panel Recent Labs    04/01/20 1630 04/01/20 1958 04/02/20 0347 04/02/20 0350  NA 146*   < > 146* 146*  K 3.8   < > 3.9 3.9  CL 103  --  105  --   CO2 34*  --  32  --   GLUCOSE 138*  --  194*  --   BUN 49*  --  50*  --   CREATININE 0.82  --  0.80  --   CALCIUM 9.1  --  8.9  --   MG 2.5*  --   --   --    < > = values in this interval not displayed.   Liver Function Tests Recent Labs    03/31/20 0433 04/01/20 0326  AST 31 41  ALT 35 42  ALKPHOS 127* 158*  BILITOT 1.4* 1.7*  PROT 6.3* 6.4*  ALBUMIN 3.8 3.7   No results for input(s): LIPASE, AMYLASE in the last 72 hours. Cardiac Enzymes No results for input(s): CKTOTAL, CKMB, CKMBINDEX, TROPONINI in the last 72 hours.  BNP: BNP (last 3 results) No results for input(s): BNP in the last 8760  hours.  ProBNP (last 3 results) No results for input(s): PROBNP in the last 8760 hours.   D-Dimer No results for input(s): DDIMER in the last 72 hours. Hemoglobin A1C No results for input(s): HGBA1C in the last 72 hours. Fasting Lipid Panel No results for input(s): CHOL, HDL, LDLCALC, TRIG, CHOLHDL, LDLDIRECT in the last 72 hours. Thyroid Function Tests No results for input(s): TSH, T4TOTAL, T3FREE, THYROIDAB in the last 72 hours.  Invalid input(s): FREET3  Other results:   Imaging    ECHOCARDIOGRAM LIMITED  Result Date: 04/01/2020    ECHOCARDIOGRAM LIMITED REPORT   Patient Name:   Alex [name] Date of Exam: 04/01/2020 Medical Rec #:  [id number]      Height:       64.0 in Accession #:    [id number]     Weight:       138.7 lb Date of Birth:  1970-07-29      BSA:          1.674 m Patient Age:    50 years       BP:           144/78 mmHg Patient Gender: M              HR:           113 bpm. Exam Location:  Inpatient Procedure: Limited Echo, Limited Color Doppler and Cardiac Doppler Indications:    I26.02 Pulmonary embolus  History:        Patient has prior history of Echocardiogram examinations, most                 recent 03/19/2020. COVID-19.  Sonographer:    Jonelle Sidle Dance Referring Phys: [id number] Candee Furbish  Sonographer Comments: Echo performed with patient supine and on artificial respirator. IMPRESSIONS  1. Left ventricular ejection fraction, by estimation, is 65 to 70%. The left ventricle has hyperdynamic function. The left ventricle has no regional wall motion abnormalities. There is mild concentric left ventricular hypertrophy.  2. Right ventricular systolic function is mildly reduced. The right ventricular size is mildly enlarged.  3. Catheter visualized in right atrium.  4. The mitral valve is normal in structure. No evidence of mitral valve regurgitation. No evidence of mitral stenosis.  5. The aortic valve is tricuspid.  Aortic valve regurgitation is not visualized. No aortic  stenosis is present.  6. The inferior vena cava is normal in size with greater than 50% respiratory variability, suggesting right atrial pressure of 3 mmHg. Comparison(s): No significant change from prior study. Conclusion(s)/Recommendation(s): Limited echo, largely similar to prior, mild RV enlargement/dilation, ECMO cannula seen in RA. FINDINGS  Left Ventricle: Left ventricular ejection fraction, by estimation, is 65 to 70%. The left ventricle has hyperdynamic function. The left ventricle has no regional wall motion abnormalities. The left ventricular internal cavity size was normal in size. There is mild concentric left ventricular hypertrophy. Right Ventricle: The right ventricular size is mildly enlarged. Right ventricular systolic function is mildly reduced. Left Atrium: Left atrial size was normal in size. Right Atrium: Catheter visualized in right atrium. Right atrial size was normal in size. Pericardium: Trivial pericardial effusion is present. Mitral Valve: The mitral valve is normal in structure. No evidence of mitral valve stenosis. Tricuspid Valve: The tricuspid valve is normal in structure. Tricuspid valve regurgitation is trivial. No evidence of tricuspid stenosis. Aortic Valve: The aortic valve is tricuspid. Aortic valve regurgitation is not visualized. No aortic stenosis is present. Pulmonic Valve: The pulmonic valve was grossly normal. Pulmonic valve regurgitation is not visualized. Venous: The inferior vena cava is normal in size with greater than 50% respiratory variability, suggesting right atrial pressure of 3 mmHg. LEFT VENTRICLE PLAX 2D LVIDd:         3.90 cm LVIDs:         2.30 cm LV PW:         1.10 cm LV IVS:        1.10 cm LVOT diam:     1.90 cm LV SV:         69 LV SV Index:   41 LVOT Area:     2.84 cm  RIGHT VENTRICLE          IVC RV Basal diam:  2.50 cm  IVC diam: 1.50 cm LEFT ATRIUM             Index       RIGHT ATRIUM           Index LA diam:        3.30 cm 1.97 cm/m  RA Area:      11.00 cm LA Vol (A2C):   37.6 ml 22.46 ml/m RA Volume:   24.30 ml  14.51 ml/m LA Vol (A4C):   29.1 ml 17.38 ml/m LA Biplane Vol: 36.1 ml 21.56 ml/m  AORTIC VALVE LVOT Vmax:   175.00 cm/s LVOT Vmean:  97.300 cm/s LVOT VTI:    0.242 m  AORTA Ao Root diam: 3.20 cm Ao Asc diam:  2.70 cm MITRAL VALVE MV Area (PHT): 3.85 cm     SHUNTS MV Decel Time: 197 msec     Systemic VTI:  0.24 m MV E velocity: 121.00 cm/s  Systemic Diam: 1.90 cm MV A velocity: 111.00 cm/s MV E/A ratio:  1.09 Buford Dresser MD Electronically signed by Buford Dresser MD Signature Date/Time: 04/01/2020/11:41:30 AM    Final      Medications:     Scheduled Medications: . acetaZOLAMIDE  250 mg Per Tube Daily  . chlorhexidine gluconate (MEDLINE KIT)  15 mL Mouth Rinse BID  . Chlorhexidine Gluconate Cloth  6 each Topical Daily  . clonazePAM  2 mg Per Tube Q6H  . cloNIDine  0.2 mg Per Tube Q8H  . docusate  100 mg Per Tube BID  .  free water  200 mL Per Tube Q6H  . furosemide  40 mg Intravenous Once  . guaiFENesin-dextromethorphan  10 mL Per Tube BID  . insulin aspart  0-20 Units Subcutaneous Q4H  . insulin aspart  6 Units Subcutaneous Q4H  . insulin detemir  35 Units Subcutaneous BID  . mouth rinse  15 mL Mouth Rinse 10 times per day  . metoprolol tartrate  50 mg Per Tube BID  . oxyCODONE  20 mg Per Tube Q6H  . pantoprazole sodium  40 mg Per Tube QHS  . polyethylene glycol  17 g Per Tube BID  . potassium chloride  20 mEq Per Tube Once  . QUEtiapine  200 mg Per Tube TID  . sennosides  10 mL Per Tube BID  . sodium chloride flush  10-40 mL Intracatheter Q12H  . valproic acid  250 mg Per Tube QHS    Infusions: . sodium chloride    . sodium chloride Stopped (03/23/20 0951)  . sodium chloride 5 mL/hr at 04/02/20 0800  . sodium chloride Stopped (03/12/20 0131)  . albumin human Stopped (03/31/20 1745)  . bivalirudin (ANGIOMAX) infusion 0.5 mg/mL (Non-ACS indications) 0.09 mg/kg/hr (04/02/20 0800)  .  ceFAZolin  (ANCEF) IV Stopped (04/02/20 9604)  . feeding supplement (PIVOT 1.5 CAL) 1,000 mL (04/02/20 0352)  . fentaNYL infusion INTRAVENOUS Stopped (03/25/20 1549)  . ketamine (KETALAR) Adult IV Infusion 1 mg/kg/hr (04/02/20 0800)    PRN Medications: Place/Maintain arterial line **AND** sodium chloride, sodium chloride, sodium chloride, acetaminophen (TYLENOL) oral liquid 160 mg/5 mL, albumin human, chlorpheniramine-HYDROcodone, guaiFENesin, haloperidol lactate, hydrALAZINE, HYDROmorphone (DILAUDID) injection, labetalol, lidocaine, lip balm, LORazepam, morphine, ondansetron (ZOFRAN) IV, oxyCODONE, sodium chloride flush   Assessment/Plan   1. Acute hypoxemic respiratory failure: Due to COVID-19 PNA with bilateral infiltrates.  Refractory hypoxemia, VV-ECMO cannulation on 02/16/2020 with improvement in oxygenation.  Developed left PTX post-subclavian CVL and had left chest tube, the left lung is re-expanded and CT out.  He was extubated 1/2 but reintubated 1/4 with agitation and suspected aspiration.  Tracheostomy 1/6.  CXR with bilateral infiltrates, stable today but gradually improved over time. ECMO cannula repositioned 1/5. LDH stable today. Sweep at 4 but hard to decrease further due to hypercarbia. Suspect significant dead space ventilation.  Now on Ancef for Group F Strep in sputum. I/Os positive without Lasix yesterday.  - Keep sweep at 4 today.  - Patient has had remdesivir, tocilizumab. - Completed steroid taper.  - ECMO circuit functioning appropriately. Areas of clotting in oxygenator chronically.  - Continue bivalirudin, goal PTT 65-80. He is at 35 today.  - Will give Lasix 40 mg IV x 1 today, also getting acetazolamide.  2. RLE DVT/LUE DVT/thrombus in RV/suspect PE: Echo with moderately dilated and moderately dysfunctional RV.  Clot noted on TEE in RV as well.  TTE 1/2 showed normal EF 60-65%, RV improved (mildly dilated/dysfunctional). TEE on 1/5 with moderate to severe RV dysfunction but  patient was hypoxemic.  Had 1/2 dose TPA on 1/5. Echo 1/20 with mildly dilated/mildly dysfunctional RV.  - Bivalirudin for goal PTT 65-80.  Management as above 3. Left PTX: Left chest tube, lung is re-expanded. Tube now out, stable CXR. 4. Shock: Suspect septic/distributive.  Now resolved, off NE.  5. Anemia: Hgb 7.9. transfuse < 8.  - Got 1 unit PRBCs this morning.   6. AKI: Resolved.  7. Hyperglycemia: insulin.  8. HTN: Remains elevated, following cuff pressure due to whip in the arterial line.   - On  clonidine.  - Continue metoprolol 50 mg bid.  9. CHB: Episode of CHB when hypoxemic.  NSR since then.  Has vagal episodes with prolonged coughing.  - Continue metoprolol, watch rhythm.  10. Thrombocytopenia: Resolved  11. Ileus: Resolved. TFs restarted.  - Getting Reglan  - Cor-trak repositioned to post-pyloric placement 12. Ischemic digits: LUE.  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  Repeat study 1/17 showed no obstruction.  13. ID: Group F Strep in sputum, on Ancef.   CRITICAL CARE Performed by: Loralie Champagne  Total critical care time: 40 minutes  Critical care time was exclusive of separately billable procedures and treating other patients.  Critical care was necessary to treat or prevent imminent or life-threatening deterioration.  Critical care was time spent personally by me on the following activities: development of treatment plan with patient and/or surrogate as well as nursing, discussions with consultants, evaluation of patient's response to treatment, examination of patient, obtaining history from patient or surrogate, ordering and performing treatments and interventions, ordering and review of laboratory studies, ordering and review of radiographic studies, pulse oximetry and re-evaluation of patient's condition.    Length of Stay: [place], MD  04/02/2020, 8:03 AM  Advanced Heart Failure Team Pager [phone] (M-F; 7a - 4p)  Please  contact [facility] for night-coverage after hours (4p -7a ) and weekends on [email or web address]

## 2020-04-02 NOTE — Plan of Care (Signed)
°  Problem: Education: Goal: Knowledge of General Education information will improve Description: Including pain rating scale, medication(s)/side effects and non-pharmacologic comfort measures Outcome: Progressing   Problem: Activity: Goal: Ability to tolerate increased activity will improve Outcome: Progressing   Problem: Respiratory: Goal: Ability to maintain a clear airway and adequate ventilation will improve Outcome: Progressing   Problem: Role Relationship: Goal: Method of communication will improve Outcome: Progressing   Problem: Clinical Measurements: Goal: Ability to maintain clinical measurements within normal limits will improve Outcome: Progressing Goal: Will remain free from infection Outcome: Progressing Goal: Diagnostic test results will improve Outcome: Progressing Goal: Respiratory complications will improve Outcome: Progressing Goal: Cardiovascular complication will be avoided Outcome: Progressing   Problem: Activity: Goal: Risk for activity intolerance will decrease Outcome: Progressing   Problem: Coping: Goal: Level of anxiety will decrease Outcome: Progressing Note: Decreasing ketamine gtt for a higher RASS goal for more patient participation.   Problem: Elimination: Goal: Will not experience complications related to bowel motility Outcome: Progressing Goal: Will not experience complications related to urinary retention Outcome: Progressing Note: Foley catheter replaced on previous shift for multiple in and out caths in 24 hours   Problem: Skin Integrity: Goal: Risk for impaired skin integrity will decrease Outcome: Progressing   Problem: Health Behavior/Discharge Planning: Goal: Ability to manage health-related needs will improve Outcome: Not Progressing

## 2020-04-02 NOTE — Progress Notes (Signed)
ANTICOAGULATION CONSULT NOTE - Follow Up Consult  Pharmacy Consult for bivalirudin Indication: ECMO and VTE  Labs: Recent Labs    03/31/20 0433 03/31/20 0445 04/01/20 0326 04/01/20 0332 04/01/20 1630 04/01/20 1958 04/02/20 0347 04/02/20 0350 04/02/20 1125 04/02/20 1614 04/02/20 1616  HGB 9.0*   < > 8.8*   < > 8.5*   < > 7.9*   < > 9.5* 9.0* 9.2*  HCT 29.6*   < > 29.0*   < > 27.1*   < > 26.7*   < > 28.0* 29.5* 27.0*  PLT 200   < > 196  --  203  --  202  --   --  213  --   APTT 82*   < > 78*  --  89*  --  76*  --   --  83*  --   LABPROT 19.6*  --  18.9*  --   --   --  18.6*  --   --   --   --   INR 1.7*  --  1.6*  --   --   --  1.6*  --   --   --   --   CREATININE 0.78   < > 0.81  --  0.82  --  0.80  --   --  0.86  --    < > = values in this interval not displayed.    Assessment: 21 yoM admitted with COVID-19 PNA with worsening hypoxia, s/p cannulation for ECMO. Pt was started on IV heparin prior to cannulation due to acute DVTs and possible PE, transitioned to bivalirudin with ECMO. Now s/p tPA on 1/5 and tracheostomy on 1/6.   APTT now slightly supratherapeutic at 83 seconds, CBC stable. No bleeding or issues with infusion per discussion with RN.   Goal of Therapy:  aPTT 60-80 seconds   Plan:  Reduce bivalirudin to 0.08 mg/kg/hr (using order-specific wt 72.1kg) Monitor q12h aPTT/CBC, LDH, and for s/sx of bleeding    Leia Alf, PharmD, BCPS Please check AMION for all [facility] contact numbers Clinical Pharmacist 04/02/2020 5:38 PM

## 2020-04-02 NOTE — Progress Notes (Signed)
ANTICOAGULATION CONSULT NOTE - Follow Up Consult  Pharmacy Consult for bivalirudin Indication: ECMO and VTE  Labs: Recent Labs    03/31/20 0433 03/31/20 0445 04/01/20 0326 04/01/20 0332 04/01/20 1630 04/01/20 1958 04/01/20 2348 04/02/20 0347 04/02/20 0350  HGB 9.0*   < > 8.8*   < > 8.5*   < > 9.2* 7.9* 8.5*  HCT 29.6*   < > 29.0*   < > 27.1*   < > 27.0* 26.7* 25.0*  PLT 200   < > 196  --  203  --   --  202  --   APTT 82*   < > 78*  --  89*  --   --  76*  --   LABPROT 19.6*  --  18.9*  --   --   --   --  18.6*  --   INR 1.7*  --  1.6*  --   --   --   --  1.6*  --   CREATININE 0.78   < > 0.81  --  0.82  --   --  0.80  --    < > = values in this interval not displayed.    Assessment: 80 yoM admitted with COVID-19 PNA with worsening hypoxia, s/p cannulation for ECMO. Pt was started on IV heparin prior to cannulation due to acute DVTs and possible PE,  transitioned to bivalirudin with ECMO. Now s/p tPA on 1/5 and tracheostomy on 1/6.   APTT is therapeutic at 76 seconds, CBC stable.   Goal of Therapy:  aPTT 60-80 seconds   Plan:  Continue bivalirudin 0.09 mg/kg/hr (using order-specific wt 72.1kg) Monitor q12h aPTT/CBC, LDH, and for s/sx of bleeding    Fredonia Highland, PharmD, BCPS, [facility] Clinical Pharmacist [phone] Please check AMION for all [facility] Pharmacy numbers 04/02/2020

## 2020-04-02 NOTE — Progress Notes (Signed)
Physical Therapy Treatment Patient Details Name: Alex [name] MRN: [id number] DOB: 08/07/1970 Today's Date: 04/02/2020    History of Present Illness 50 y.o. male  with no significant past medical history admitted on 02/11/2020 with dyspnea, cough, nausea/vomiting ~ 1 week ago with worsening symptoms of body aches and fatigue and +COVID 02/07/20 and admitted with shortness of breath. Required intubation 03/10/20. Cannulated for VV ECMO 02/25/2020. Oxygenating better with ECMO. Also evidence of RLE DVT, RV thrombus, and high suspicion of PE. Has required chest tube to L lung for collapse which needs further reposition with recurrent collapse. Extubated 03-15-19.  Reintubated 1/5 and trach placed 1/6.    PT Comments    Pt unable to participate in therapy most of week due to medical complications. Team working on modification of medication to minimize sedation while maintaining safe respiratory status. RN request ROM to improve level of arousal. With maximal multimodal cuing pt able to open eyes briefly however unable to activate muscles. Provided full PROM of LEs. RN hopeful that level of sedation will continue to be decreased for improved function and ability to come to EoB in next session.    Follow Up Recommendations  CIR;Supervision/Assistance - 24 hour     Equipment Recommendations  Other (comment) (TBA)       Precautions / Restrictions Precautions Precaution Comments: ECMO, Vent, rectal tube, Foley Restrictions Weight Bearing Restrictions: No    Mobility  Bed Mobility Overal bed mobility: Needs Assistance Bed Mobility: Rolling Rolling: Max assist;+2 for physical assistance         General bed mobility comments: maxAx2 for rolling to for placement of wedge for weightshift  Transfers                            Cognition Arousal/Alertness: Lethargic;Suspect due to medications   Overall Cognitive Status: Difficult to assess                                         Exercises General Exercises - Upper Extremity Shoulder Flexion: PROM;Both;Supine;10 reps Shoulder ABduction: PROM;Both;10 reps;Supine Elbow Flexion: PROM;Both;Supine;10 reps Elbow Extension: PROM;Both;Supine;10 reps Wrist Flexion: PROM;Both;10 reps;Supine Wrist Extension: PROM;Both;10 reps;Supine Digit Composite Flexion: PROM;Both;10 reps;Supine Composite Extension: PROM;Both;10 reps;Supine General Exercises - Lower Extremity Ankle Circles/Pumps: PROM;Both;10 reps;Supine Short Arc Quad: PROM;Both;10 reps;Supine Heel Slides: PROM;Both;10 reps;Seated Hip ABduction/ADduction: PROM;Both;10 reps;Supine Straight Leg Raises: PROM;Both;10 reps;Supine Hip Flexion/Marching: PROM;Both;10 reps;Supine    General Comments General comments (skin integrity, edema, etc.): Pt with RR in high 40s, RN working on reduction of sedation and hopeful that working with therapy would help level of arousal, pt able to open eyes during session, however unable to command follow      Pertinent Vitals/Pain Pain Assessment: Faces Faces Pain Scale: No hurt           PT Goals (current goals can now be found in the care plan section) Acute Rehab PT Goals Patient Stated Goal: unable to state PT Goal Formulation: With patient Time For Goal Achievement: 04/09/20 Potential to Achieve Goals: Fair Progress towards PT goals: Not progressing toward goals - comment (level of sedation)    Frequency    Min 3X/week      PT Plan Current plan remains appropriate       AM-PAC PT "6 Clicks" Mobility   Outcome Measure  Help needed turning from your back to your  side while in a flat bed without using bedrails?: Total Help needed moving from lying on your back to sitting on the side of a flat bed without using bedrails?: Total Help needed moving to and from a bed to a chair (including a wheelchair)?: Total Help needed standing up from a chair using your arms (e.g., wheelchair or bedside chair)?:  Total Help needed to walk in hospital room?: Total Help needed climbing 3-5 steps with a railing? : Total 6 Click Score: 6    End of Session Equipment Utilized During Treatment: Other (comment) (vent with trach) Activity Tolerance: Treatment limited secondary to medical complications (Comment) (level of sedation and increased RR) Patient left: in bed;with nursing/sitter in room Nurse Communication: Other (comment) (level of arousal with ROM) PT Visit Diagnosis: Muscle weakness (generalized) (M62.81);Pain     Time: 8527-7824 PT Time Calculation (min) (ACUTE ONLY): 9 min  Charges:  $Therapeutic Exercise: 8-22 mins                     Alex [name] B. Beverely Risen PT, DPT Acute Rehabilitation Services Pager [phone] Office [phone]    Elon Alas Fleet 04/02/2020, 1:05 PM

## 2020-04-03 ENCOUNTER — Inpatient Hospital Stay (HOSPITAL_COMMUNITY): Payer: Medicaid Other

## 2020-04-03 LAB — CULTURE, RESPIRATORY W GRAM STAIN
Culture: >=100000 — AB
Gram Stain: NONE SEEN

## 2020-04-03 LAB — POCT I-STAT 7, (LYTES, BLD GAS, ICA,H+H)
Acid-Base Excess: 5 mmol/L — ABNORMAL HIGH (ref 0.0–2.0)
Acid-Base Excess: 5 mmol/L — ABNORMAL HIGH (ref 0.0–2.0)
Acid-Base Excess: 5 mmol/L — ABNORMAL HIGH (ref 0.0–2.0)
Acid-Base Excess: 8 mmol/L — ABNORMAL HIGH (ref 0.0–2.0)
Bicarbonate: 31.6 mmol/L — ABNORMAL HIGH (ref 20.0–28.0)
Bicarbonate: 31.8 mmol/L — ABNORMAL HIGH (ref 20.0–28.0)
Bicarbonate: 32.6 mmol/L — ABNORMAL HIGH (ref 20.0–28.0)
Bicarbonate: 35.2 mmol/L — ABNORMAL HIGH (ref 20.0–28.0)
Calcium, Ion: 1.29 mmol/L (ref 1.15–1.40)
Calcium, Ion: 1.27 mmol/L (ref 1.15–1.40)
Calcium, Ion: 1.25 mmol/L (ref 1.15–1.40)
Calcium, Ion: 1.24 mmol/L (ref 1.15–1.40)
HCT: 29 % — ABNORMAL LOW (ref 39.0–52.0)
HCT: 28 % — ABNORMAL LOW (ref 39.0–52.0)
HCT: 27 % — ABNORMAL LOW (ref 39.0–52.0)
HCT: 28 % — ABNORMAL LOW (ref 39.0–52.0)
Hemoglobin: 9.9 g/dL — ABNORMAL LOW (ref 13.0–17.0)
Hemoglobin: 9.5 g/dL — ABNORMAL LOW (ref 13.0–17.0)
Hemoglobin: 9.2 g/dL — ABNORMAL LOW (ref 13.0–17.0)
Hemoglobin: 9.5 g/dL — ABNORMAL LOW (ref 13.0–17.0)
O2 Saturation: 93 %
O2 Saturation: 95 %
O2 Saturation: 94 %
O2 Saturation: 94 %
Patient temperature: 37
Patient temperature: 37
Patient temperature: 36.9
Patient temperature: 37
Potassium: 4.3 mmol/L (ref 3.5–5.1)
Potassium: 4.5 mmol/L (ref 3.5–5.1)
Potassium: 4.4 mmol/L (ref 3.5–5.1)
Potassium: 4.3 mmol/L (ref 3.5–5.1)
Sodium: 146 mmol/L — ABNORMAL HIGH (ref 135–145)
Sodium: 145 mmol/L (ref 135–145)
Sodium: 146 mmol/L — ABNORMAL HIGH (ref 135–145)
Sodium: 145 mmol/L (ref 135–145)
TCO2: 33 mmol/L — ABNORMAL HIGH (ref 22–32)
TCO2: 34 mmol/L — ABNORMAL HIGH (ref 22–32)
TCO2: 35 mmol/L — ABNORMAL HIGH (ref 22–32)
TCO2: 37 mmol/L — ABNORMAL HIGH (ref 22–32)
pCO2 arterial: 59.4 mmHg — ABNORMAL HIGH (ref 32.0–48.0)
pCO2 arterial: 59.8 mmHg — ABNORMAL HIGH (ref 32.0–48.0)
pCO2 arterial: 62.7 mmHg — ABNORMAL HIGH (ref 32.0–48.0)
pCO2 arterial: 63 mmHg — ABNORMAL HIGH (ref 32.0–48.0)
pH, Arterial: 7.334 — ABNORMAL LOW (ref 7.350–7.450)
pH, Arterial: 7.333 — ABNORMAL LOW (ref 7.350–7.450)
pH, Arterial: 7.324 — ABNORMAL LOW (ref 7.350–7.450)
pH, Arterial: 7.355 (ref 7.350–7.450)
pO2, Arterial: 74 mmHg — ABNORMAL LOW (ref 83.0–108.0)
pO2, Arterial: 82 mmHg — ABNORMAL LOW (ref 83.0–108.0)
pO2, Arterial: 81 mmHg — ABNORMAL LOW (ref 83.0–108.0)
pO2, Arterial: 75 mmHg — ABNORMAL LOW (ref 83.0–108.0)

## 2020-04-03 LAB — CBC
HCT: 30.1 % — ABNORMAL LOW (ref 39.0–52.0)
HCT: 29.2 % — ABNORMAL LOW (ref 39.0–52.0)
Hemoglobin: 9.1 g/dL — ABNORMAL LOW (ref 13.0–17.0)
Hemoglobin: 8.8 g/dL — ABNORMAL LOW (ref 13.0–17.0)
MCH: 29.4 pg (ref 26.0–34.0)
MCH: 29.6 pg (ref 26.0–34.0)
MCHC: 30.2 g/dL (ref 30.0–36.0)
MCHC: 30.1 g/dL (ref 30.0–36.0)
MCV: 97.4 fL (ref 80.0–100.0)
MCV: 98.3 fL (ref 80.0–100.0)
Platelets: 226 10*3/uL (ref 150–400)
Platelets: 230 10*3/uL (ref 150–400)
RBC: 3.09 MIL/uL — ABNORMAL LOW (ref 4.22–5.81)
RBC: 2.97 MIL/uL — ABNORMAL LOW (ref 4.22–5.81)
RDW: 19.8 % — ABNORMAL HIGH (ref 11.5–15.5)
RDW: 19.6 % — ABNORMAL HIGH (ref 11.5–15.5)
WBC: 11.8 10*3/uL — ABNORMAL HIGH (ref 4.0–10.5)
WBC: 12.3 10*3/uL — ABNORMAL HIGH (ref 4.0–10.5)
nRBC: 0.8 % — ABNORMAL HIGH (ref 0.0–0.2)
nRBC: 1.1 % — ABNORMAL HIGH (ref 0.0–0.2)

## 2020-04-03 LAB — GLUCOSE, CAPILLARY
Glucose-Capillary: 151 mg/dL — ABNORMAL HIGH (ref 70–99)
Glucose-Capillary: 209 mg/dL — ABNORMAL HIGH (ref 70–99)
Glucose-Capillary: 118 mg/dL — ABNORMAL HIGH (ref 70–99)
Glucose-Capillary: 122 mg/dL — ABNORMAL HIGH (ref 70–99)
Glucose-Capillary: 109 mg/dL — ABNORMAL HIGH (ref 70–99)
Glucose-Capillary: 146 mg/dL — ABNORMAL HIGH (ref 70–99)

## 2020-04-03 LAB — PROTIME-INR
INR: 1.5 — ABNORMAL HIGH (ref 0.8–1.2)
Prothrombin Time: 17.6 seconds — ABNORMAL HIGH (ref 11.4–15.2)

## 2020-04-03 LAB — APTT
aPTT: 69 seconds — ABNORMAL HIGH (ref 24–36)
aPTT: 80 seconds — ABNORMAL HIGH (ref 24–36)

## 2020-04-03 LAB — LACTIC ACID, PLASMA
Lactic Acid, Venous: 1.2 mmol/L (ref 0.5–1.9)
Lactic Acid, Venous: 0.8 mmol/L (ref 0.5–1.9)

## 2020-04-03 LAB — BASIC METABOLIC PANEL
Anion gap: 10 (ref 5–15)
Anion gap: 12 (ref 5–15)
BUN: 51 mg/dL — ABNORMAL HIGH (ref 6–20)
BUN: 46 mg/dL — ABNORMAL HIGH (ref 6–20)
CO2: 29 mmol/L (ref 22–32)
CO2: 28 mmol/L (ref 22–32)
Calcium: 8.9 mg/dL (ref 8.9–10.3)
Calcium: 8.6 mg/dL — ABNORMAL LOW (ref 8.9–10.3)
Chloride: 107 mmol/L (ref 98–111)
Chloride: 104 mmol/L (ref 98–111)
Creatinine, Ser: 0.83 mg/dL (ref 0.61–1.24)
Creatinine, Ser: 0.76 mg/dL (ref 0.61–1.24)
GFR, Estimated: >60 mL/min (ref >60)
GFR, Estimated: >60 mL/min (ref >60)
Glucose, Bld: 176 mg/dL — ABNORMAL HIGH (ref 70–99)
Glucose, Bld: 156 mg/dL — ABNORMAL HIGH (ref 70–99)
Potassium: 4.3 mmol/L (ref 3.5–5.1)
Potassium: 4.5 mmol/L (ref 3.5–5.1)
Sodium: 146 mmol/L — ABNORMAL HIGH (ref 135–145)
Sodium: 144 mmol/L (ref 135–145)

## 2020-04-03 LAB — FIBRINOGEN: Fibrinogen: 631 mg/dL — ABNORMAL HIGH (ref 210–475)

## 2020-04-03 LAB — LACTATE DEHYDROGENASE: LDH: 629 U/L — ABNORMAL HIGH (ref 98–192)

## 2020-04-03 MED ORDER — BETHANECHOL CHLORIDE 10 MG PO TABS
10.0000 mg | ORAL_TABLET | Freq: Three times a day (TID) | ORAL | Status: DC
  Administered 2020-04-03 – 2020-04-11 (×24): 10 mg
  Filled 2020-04-03 (×25): qty 1

## 2020-04-03 MED ORDER — SIMETHICONE 40 MG/0.6ML PO SUSP
40.0000 mg | Freq: Four times a day (QID) | ORAL | Status: DC | PRN
  Administered 2020-04-12 – 2020-04-13 (×2): 40 mg
  Filled 2020-04-03 (×3): qty 0.6

## 2020-04-03 MED ORDER — EPINEPHRINE 1 MG/10ML IJ SOSY
PREFILLED_SYRINGE | INTRAMUSCULAR | Status: AC
  Filled 2020-04-03: qty 20

## 2020-04-03 MED ORDER — SODIUM BICARBONATE 8.4 % IV SOLN
INTRAVENOUS | Status: AC
  Filled 2020-04-03: qty 50

## 2020-04-03 MED ORDER — FLORANEX PO PACK
1.0000 g | PACK | Freq: Three times a day (TID) | ORAL | Status: DC
  Administered 2020-04-03 – 2020-04-05 (×6): 1 g
  Filled 2020-04-03 (×9): qty 1

## 2020-04-03 MED ORDER — BACITRACIN-NEOMYCIN-POLYMYXIN OINTMENT TUBE
TOPICAL_OINTMENT | CUTANEOUS | Status: DC | PRN
  Administered 2020-04-03 – 2020-04-11 (×2): 1 via TOPICAL
  Filled 2020-04-03: qty 14

## 2020-04-03 MED ORDER — FUROSEMIDE 10 MG/ML IJ SOLN
40.0000 mg | Freq: Two times a day (BID) | INTRAMUSCULAR | Status: DC
  Administered 2020-04-03 – 2020-04-04 (×4): 40 mg via INTRAVENOUS
  Filled 2020-04-03 (×4): qty 4

## 2020-04-03 MED ORDER — PHENYLEPHRINE 40 MCG/ML (10ML) SYRINGE FOR IV PUSH (FOR BLOOD PRESSURE SUPPORT)
PREFILLED_SYRINGE | INTRAVENOUS | Status: AC
  Filled 2020-04-03: qty 10

## 2020-04-03 MED ORDER — IOHEXOL 350 MG/ML SOLN
100.0000 mL | Freq: Once | INTRAVENOUS | Status: AC | PRN
  Administered 2020-04-03: 100 mL via INTRAVENOUS

## 2020-04-03 MED ORDER — SENNOSIDES 8.8 MG/5ML PO SYRP
10.0000 mL | ORAL_SOLUTION | Freq: Every day | ORAL | Status: DC
  Administered 2020-04-04 – 2020-05-05 (×28): 10 mL
  Filled 2020-04-03 (×29): qty 10

## 2020-04-03 MED ORDER — ALBUMIN HUMAN 5 % IV SOLN
INTRAVENOUS | Status: AC
  Filled 2020-04-03: qty 250

## 2020-04-03 MED ORDER — GUAIFENESIN 100 MG/5ML PO SOLN
5.0000 mL | ORAL | Status: DC | PRN
  Administered 2020-04-03 – 2020-04-20 (×25): 100 mg
  Filled 2020-04-03 (×26): qty 5

## 2020-04-03 MED ORDER — MIDAZOLAM HCL 2 MG/2ML IJ SOLN
INTRAMUSCULAR | Status: AC
  Filled 2020-04-03: qty 2

## 2020-04-03 MED ORDER — CALCIUM CHLORIDE 10 % IV SOLN
INTRAVENOUS | Status: AC
  Filled 2020-04-03: qty 10

## 2020-04-03 MED ORDER — VANCOMYCIN HCL 1750 MG/350ML IV SOLN
1750.0000 mg | Freq: Once | INTRAVENOUS | Status: AC
  Administered 2020-04-03: 1750 mg via INTRAVENOUS
  Filled 2020-04-03: qty 350

## 2020-04-03 MED ORDER — VANCOMYCIN HCL 750 MG/150ML IV SOLN
750.0000 mg | Freq: Two times a day (BID) | INTRAVENOUS | Status: DC
  Administered 2020-04-03 – 2020-04-05 (×5): 750 mg via INTRAVENOUS
  Filled 2020-04-03 (×6): qty 150

## 2020-04-03 NOTE — Progress Notes (Signed)
ANTICOAGULATION CONSULT NOTE - Follow Up Consult  Pharmacy Consult for bivalirudin Indication: ECMO and VTE  Labs: Recent Labs    04/01/20 0326 04/01/20 0332 04/02/20 0347 04/02/20 0350 04/02/20 1614 04/02/20 1616 04/03/20 0311 04/03/20 0312 04/03/20 0804 04/03/20 1550 04/03/20 1615  HGB 8.8*   < > 7.9*   < > 9.0*   < > 9.1*   < > 9.5* 9.2* 8.8*  HCT 29.0*   < > 26.7*   < > 29.5*   < > 30.1*   < > 28.0* 27.0* 29.2*  PLT 196   < > 202  --  213  --  226  --   --   --  230  APTT 78*   < > 76*  --  83*  --  69*  --   --   --  80*  LABPROT 18.9*  --  18.6*  --   --   --  17.6*  --   --   --   --   INR 1.6*  --  1.6*  --   --   --  1.5*  --   --   --   --   CREATININE 0.81   < > 0.80  --  0.86  --  0.83  --   --   --  0.76   < > = values in this interval not displayed.    Assessment: 15 yoM admitted with COVID-19 PNA with worsening hypoxia, s/p cannulation for ECMO. Pt was started on IV heparin prior to cannulation due to acute DVTs and possible PE, transitioned to bivalirudin with ECMO. Now s/p tPA on 1/5 and tracheostomy on 1/6.   APTT is therapeutic at 80, on bivalirudin@0 .08 mg/kg/hr. Hgb 9.9, plt 226. Fibrinogen 631, LDH 629. No s/sx of bleeding or infusion issues. Circuit stable.  Goal of Therapy:  aPTT 60-80 seconds   Plan:  Continue bivalirudin to 0.08 mg/kg/hr (using order-specific wt 72.1kg) Monitor q12h aPTT/CBC, LDH, and for s/sx of bleeding   Alphia Moh, PharmD, BCPS, BCCP Clinical Pharmacist  Please check AMION for all [facility] Pharmacy phone numbers After 10:00 PM, call Main Pharmacy [phone]

## 2020-04-03 NOTE — Progress Notes (Signed)
ECMO PROGRESS NOTE  NAME:  Alex [name], MRN:  [id number], DOB:  1971-02-16, LOS: 25 ADMISSION DATE:  02/23/2020, CONSULTATION DATE: 03-17-2020 REFERRING MD: Wynona Neat -LBPCCM, CHIEF COMPLAINT: Respiratory failure requiring ECMO  HPI/course in hospital  50 year old man admitted to hospital 12/28 with 1 week history of dyspnea cough nausea and vomiting.  Initially admitted to [facility] long hospital and placed on high flow nasal cannula but rapidly failed and required intubation 12/29.  Persistent hypoxic respiratory failure with PF ratio 55 in spite of 18 of PEEP FiO2 0.1 despite paralytics.  Did not improve with prone ventilation  Cannulated for VV ECMO 12/30 via right IJ crescent cannula. Iatrogenic pneumothorax from left subclavian triple-lumen placement  Family confirms no significant past medical history apart from possible prediabetes  Past Medical History  none  Interim history/subjective:  Anxiety overnight requiring prn sedation and increase ketamine. CT chest today reveals lung clot.   Objective   Blood pressure 113/78, pulse (!) 107, temperature 98.8 F (37.1 C), temperature source Core, resp. rate 18, height 5\' 4"  (1.626 m), weight 63.5 kg, SpO2 96 %.    Vent Mode: PCV FiO2 (%):  [50 %] 50 % Set Rate:  [20 bmp] 20 bmp PEEP:  [8 cmH20-10 cmH20] 8 cmH20 Pressure Support:  [1 cmH20] 1 cmH20 Plateau Pressure:  [14 cmH20-35 cmH20] 35 cmH20   Intake/Output Summary (Last 24 hours) at 04/03/2020 1426 Last data filed at 04/03/2020 1400 Gross per 24 hour  Intake 3854.08 ml  Output 3265 ml  Net 589.08 ml   Filed Weights   04/01/20 0349 04/02/20 0500 04/03/20 0500  Weight: 62.9 kg 62.6 kg 63.5 kg    ECMO Device: Cardiohelp  ECMO Mode: VV  Flow (LPM): 4.34   Examination: Constitutional: Continued anxiety Eyes: EOMI, pupils equal Ears, nose, mouth, and throat: trach in place with small bloody secretions Cardiovascular: tachycardic, ext warm Respiratory: crackles at  bases L>R  Gastrointestinal: soft, +BS, cortrak in place Skin: No rashes, normal turgor, line site intact. Blistering left hand.  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  Anxiety, air hunger, intermittent delirium- ongoing issue complicating sweep weans Gastroparesis with some element ileus Ischemic changes L hand Muscular deconditioning DM2 with hyperglycemia  Plan:  - Continue VV ECMO support  - Continue ultra lung-protective ventilation - follow for spontaneous Vt recovery - Major difficulty at this time is high dead-space ventilation - Anxiety and delirium are also affecting ability to wean sweep - continue current sedation strategy and increase nighttime dosing to encourage normal sleep-wake cycle. - No intervention for PE apart from anticoagulation given small distal chronic appearing clots.   Daily Goals Checklist  Pain/Anxiety/Delirium protocol (if indicated): ketamine IV, enteral lorazepam, valproate, hydromorphone prn,  oxycodone, clonidine, quetiapine. Add melatonin at night.  VAP protocol (if indicated): bundle in place.  Respiratory support goals: Rest settings. Continue nebulized morphine and lidocaine for cough.  Blood pressure target: metoprolol for BP and HR DVT prophylaxis: bivalirudin Nutrition Status:  Tolerating tube feeds, will decrease laxatives and add probiotic GI prophylaxis: pantoprazole Fluid status goals: appears euvolemic. Urinary catheter: foley catheter due to retention.  Trial of Benechol Central lines: Left [place] TLC intact Glucose control: Euglcyemic on combination of SSI  and Basal insulin. Mobility/therapy needs: Mobilization as tolerated.  Antibiotic  de-escalation: Complete 7 days of vancomycin Home medication reconciliation: on hold Daily labs: per - ECMO protocol  Code Status: Full code Family Communication: Son was updated 1/22 Disposition: ICU.    CRITICAL CARE Performed by: Lynnell Catalan   Total critical care time: 40 minutes  Critical care time was exclusive of separately billable procedures and treating other patients.  Critical care was necessary to treat or prevent imminent or life-threatening deterioration.  Critical care was time spent personally by me on the following activities: development of treatment plan with patient and/or surrogate as well as nursing, discussions with consultants, evaluation of patient's response to treatment, examination of patient, obtaining history from patient or surrogate, ordering and performing treatments and interventions, ordering and review of laboratory studies, ordering and review of radiographic studies, pulse oximetry, re-evaluation of patient's condition and participation in multidisciplinary rounds.  Lynnell Catalan, MD [facility] ICU Physician [facility] [place] [facility]  Pager: [phone] Mobile: [phone] After hours: [phone].

## 2020-04-03 NOTE — Progress Notes (Addendum)
Pharmacy Antibiotic Note  Alex [name] is a 50 y.o. male admitted on 02/20/2020 with pneumonia.  Pharmacy has been consulted for vancomycin dosing.  WBC 11.8, afebrile. LA 1.2. Scr 0.83 (CrCl 92 mL/min). Currently on cefazolin for group F strep - now also growing enterococcus faecalis (pan-sensitive) - however, ampicillin not preferred with ECMO. Will change from cefazolin to vancomycin. Received vancomycin 1750 mg once this morning.   Plan: Will order vancomycin 750 mg IV every 12 hours Monitor renal fx, cx results, clinical pic, vancomycin level at steady state  Height: _0  (162.6 cm) Weight: 63.5 kg (139 lb 15.9 oz) IBW/kg (Calculated) : 59.2  Temp (24hrs), Avg:98.1 F (36.7 C), Min:96.9 F (36.1 C), Max:99 F (37.2 C)  Recent Labs  Lab 04/01/20 0326 04/01/20 1630 04/02/20 0347 04/02/20 1614 04/03/20 0311 04/03/20 0312  WBC 8.2 9.0 9.1 10.9* 11.8*  --   CREATININE 0.81 0.82 0.80 0.86 0.83  --   LATICACIDVEN 1.1 1.3 1.0 1.1  --  1.2    Estimated Creatinine Clearance: 92.1 mL/min (by C-G formula based on SCr of 0.83 mg/dL).    No Known Allergies  Antimicrobials this admission: Remdesivir 12/28-1/1 CTX/Azith starting 12/28 x 5 days> end 1/2 Vanc 1/3>>1/10, restart 1/22>> Cefepime 1/3>>1/12 Cefazolin 1/20>>1/22  Dose adjustments this admission: N/A  Microbiology results: 1/18 BAL: >100k groupF Strep, 80k enterococcus (pan sen) 1/6 TA normal flora 12/28 BCx x 2 > propionibacterium 1/3BCx femoral stick > staph hominis,lugdun,epi 1/3UCx: neg 1/3Sputum: sent  12/28MRSA PCR: neg   Thank you for allowing pharmacy to be a part of this patient's care.  Antonietta Jewel, PharmD, [place] Clinical Pharmacist  Phone: [phone] 04/03/2020 11:59 AM  Please check AMION for all [place] phone numbers After 10:00 PM, call [place] [phone]

## 2020-04-03 NOTE — Progress Notes (Signed)
ANTICOAGULATION CONSULT NOTE - Follow Up Consult  Pharmacy Consult for bivalirudin Indication: ECMO and VTE  Labs: Recent Labs    04/01/20 0326 04/01/20 0332 04/02/20 0347 04/02/20 0350 04/02/20 1614 04/02/20 1616 04/02/20 2326 04/03/20 0311 04/03/20 0312  HGB 8.8*   < > 7.9*   < > 9.0*   < > 9.9* 9.1* 9.9*  HCT 29.0*   < > 26.7*   < > 29.5*   < > 29.0* 30.1* 29.0*  PLT 196   < > 202  --  213  --   --  226  --   APTT 78*   < > 76*  --  83*  --   --  69*  --   LABPROT 18.9*  --  18.6*  --   --   --   --  17.6*  --   INR 1.6*  --  1.6*  --   --   --   --  1.5*  --   CREATININE 0.81   < > 0.80  --  0.86  --   --  0.83  --    < > = values in this interval not displayed.    Assessment: 20 yoM admitted with COVID-19 PNA with worsening hypoxia, s/p cannulation for ECMO. Pt was started on IV heparin prior to cannulation due to acute DVTs and possible PE, transitioned to bivalirudin with ECMO. Now s/p tPA on 1/5 and tracheostomy on 1/6.   APTT is therapeutic this morning at 69, on bivalirudin@0 .08 mg/kg/hr. Hgb 9.9, plt 226. Fibrinogen 631, LDH 629. No s/sx of bleeding or infusion issues. Circuit stable.  Goal of Therapy:  aPTT 60-80 seconds   Plan:  Continue bivalirudin to 0.08 mg/kg/hr (using order-specific wt 72.1kg) Monitor q12h aPTT/CBC, LDH, and for s/sx of bleeding   Sherron Monday, PharmD, BCCCP Clinical Pharmacist  Phone: [phone] 04/03/2020 7:17 AM  Please check AMION for all [facility] Pharmacy phone numbers After 10:00 PM, call Main Pharmacy [phone]

## 2020-04-03 NOTE — Progress Notes (Signed)
Patient ID: Alex [name], male   DOB: 23-May-1970, 50 y.o.   MRN: [id number]     Advanced Heart Failure Rounding Note  PCP-Cardiologist: No primary care provider on file.   Subjective:    - 12/30: VV ECMO cannulation - 12/31: Left chest tube replaced - 1/2: Extubated. Echo with EF 60-65%, mildly dilated RV with mildly decreased systolic function.  - 1/4: Agitated, suspected aspiration.  Re-intubated.  - 1/5: ECMO cannula repositioned under TEE guidance. TEE showed moderately dilated/moderate-severely dysfunctional RV in setting of hypoxemia. LUE DVT found.  Patient got 1/2 dose of TPA due to initial concern for large PE.  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  - 1/6: Tracheostomy - 1/7: Echo with mild RV dilation/mild RV dysfunction.  - 1/16: Left chest tube out - 1/17: LUE arterial dopplers repeated, showed no obstruction.  - 1/20: Echo with EF 65-70%, mildly D-shaped septum, mildly dilated and mildly dysfunctional RV.   Sweep at 5 this morning, breathing comfortably and sleeping currently.  Yesterday had increased work of breathing/tachypnea with sweep up to 8. CXR today with decreased aeration right base.   Ancef for Group F Strep in trach aspirate. Also growing Enterococcus faecalis.   I/Os +1000 with 1 dose of IV Lasix and acetazolamide.      ECMO parameters: 3300 rpm Flow 4.3 L/min Pvenous -61 Delta P 28 Sweep 5  ABG 7.33/59/74/93% LDH  998 => 1305 => 901 => 1078 => 1,170 => 1,117 => 911 => 751 => 699 => 691 => 737 => 671 => 629 => 649 => 629 Lactate 1.2 PTT 69  Objective:   Weight Range: 63.5 kg Body mass index is 24.03 kg/m.   Vital Signs:   Temp:  [96.9 F (36.1 C)-99 F (37.2 C)] 99 F (37.2 C) (01/22 0400) Pulse Rate:  [98-123] 119 (01/22 0600) Resp:  [0-41] 34 (01/22 0600) BP: (101-181)/(65-105) 121/67 (01/22 0600) SpO2:  [93 %-100 %] 99 % (01/22 0602) Arterial Line BP: (116-226)/(54-91) 139/63 (01/22 0600) FiO2 (%):  [50 %]  50 % (01/22 0602) Weight:  [63.5 kg] 63.5 kg (01/22 0500) Last BM Date: 04/02/20  Weight change: Filed Weights   04/01/20 0349 04/02/20 0500 04/03/20 0500  Weight: 62.9 kg 62.6 kg 63.5 kg    Intake/Output:   Intake/Output Summary (Last 24 hours) at 04/03/2020 0734 Last data filed at 04/03/2020 2993 Gross per 24 hour  Intake 3883.72 ml  Output 2845 ml  Net 1038.72 ml      Physical Exam    General: NAD Neck: Tracheostomy. No JVD, no thyromegaly or thyroid nodule.  Lungs: Decreased at bases.  CV: Nondisplaced PMI.  Heart mildly tachy regular S1/S2, no S3/S4, no murmur.  No peripheral edema.    Abdomen: Soft, nontender, no hepatosplenomegaly, no distention.  Skin: Intact without lesions or rashes.  Neurologic: Sleeping but alert/follows commands when awake.  Extremities: No clubbing or cyanosis.  HEENT: Normal.    Telemetry   NSR 110s Personally reviewed   Labs    CBC Recent Labs    04/01/20 0326 04/01/20 0332 04/02/20 1614 04/02/20 1616 04/03/20 0311 04/03/20 0312  WBC 8.2   < > 10.9*  --  11.8*  --   NEUTROABS 5.8  --   --   --   --   --   HGB 8.8*   < > 9.0*   < > 9.1* 9.9*  HCT 29.0*   < > 29.5*   < > 30.1* 29.0*  MCV 101.8*   < > 96.1  --  97.4  --   PLT 196   < > 213  --  226  --    < > = values in this interval not displayed.   Basic Metabolic Panel Recent Labs    04/01/20 1630 04/01/20 1958 04/02/20 1614 04/02/20 1616 04/03/20 0311 04/03/20 0312  NA 146*   < > 145   < > 146* 146*  K 3.8   < > 3.8   < > 4.3 4.3  CL 103   < > 104  --  107  --   CO2 34*   < > 31  --  29  --   GLUCOSE 138*   < > 161*  --  176*  --   BUN 49*   < > 52*  --  51*  --   CREATININE 0.82   < > 0.86  --  0.83  --   CALCIUM 9.1   < > 8.9  --  8.9  --   MG 2.5*  --   --   --   --   --    < > = values in this interval not displayed.   Liver Function Tests Recent Labs    04/01/20 0326  AST 41  ALT 42  ALKPHOS 158*  BILITOT 1.7*  PROT 6.4*  ALBUMIN 3.7   No  results for input(s): LIPASE, AMYLASE in the last 72 hours. Cardiac Enzymes No results for input(s): CKTOTAL, CKMB, CKMBINDEX, TROPONINI in the last 72 hours.  BNP: BNP (last 3 results) No results for input(s): BNP in the last 8760 hours.  ProBNP (last 3 results) No results for input(s): PROBNP in the last 8760 hours.   D-Dimer No results for input(s): DDIMER in the last 72 hours. Hemoglobin A1C No results for input(s): HGBA1C in the last 72 hours. Fasting Lipid Panel No results for input(s): CHOL, HDL, LDLCALC, TRIG, CHOLHDL, LDLDIRECT in the last 72 hours. Thyroid Function Tests No results for input(s): TSH, T4TOTAL, T3FREE, THYROIDAB in the last 72 hours.  Invalid input(s): FREET3  Other results:   Imaging    DG CHEST PORT 1 VIEW  Result Date: 04/03/2020 CLINICAL DATA:  Cardio respiratory failure EXAM: PORTABLE CHEST 1 VIEW COMPARISON:  Yesterday FINDINGS: Sheath in place. Left IJ line with tip at the SVC. The enteric and tracheostomy tubes are in good position. Confluent consolidation. Stable heart size. No visible effusion or air leak. IMPRESSION: Stable hardware positioning and aeration. Electronically Signed   By: Monte Fantasia M.D.   On: 04/03/2020 06:39     Medications:     Scheduled Medications: . acetaZOLAMIDE  250 mg Per Tube Daily  . chlorhexidine gluconate (MEDLINE KIT)  15 mL Mouth Rinse BID  . Chlorhexidine Gluconate Cloth  6 each Topical Daily  . clonazePAM  2 mg Per Tube Q6H  . cloNIDine  0.2 mg Per Tube Q8H  . docusate  100 mg Per Tube BID  . fiber  1 packet Per Tube BID  . free water  200 mL Per Tube Q4H  . guaiFENesin-dextromethorphan  10 mL Per Tube BID  . insulin aspart  0-20 Units Subcutaneous Q4H  . insulin aspart  6 Units Subcutaneous Q4H  . insulin detemir  35 Units Subcutaneous BID  . mouth rinse  15 mL Mouth Rinse 10 times per day  . metoprolol tartrate  50 mg Per Tube BID  . nitroGLYCERIN  0.5 inch Topical Q6H  .  oxyCODONE  20 mg  Per Tube Q6H  . pantoprazole sodium  40 mg Per Tube QHS  . QUEtiapine  200 mg Per Tube TID  . sennosides  10 mL Per Tube BID  . sodium chloride flush  10-40 mL Intracatheter Q12H  . valproic acid  250 mg Per Tube QHS    Infusions: . sodium chloride    . sodium chloride Stopped (03/23/20 0951)  . sodium chloride 5 mL/hr at 04/03/20 7628  . sodium chloride Stopped (03/12/20 0131)  . albumin human Stopped (03/31/20 1745)  . bivalirudin (ANGIOMAX) infusion 0.5 mg/mL (Non-ACS indications) 0.08 mg/kg/hr (04/03/20 3151)  .  ceFAZolin (ANCEF) IV Stopped (04/03/20 0546)  . feeding supplement (PIVOT 1.5 CAL) 1,000 mL (04/03/20 0301)  . ketamine (KETALAR) Adult IV Infusion 0.25 mg/kg/hr (04/03/20 7616)    PRN Medications: Place/Maintain arterial line **AND** sodium chloride, sodium chloride, sodium chloride, acetaminophen (TYLENOL) oral liquid 160 mg/5 mL, albumin human, chlorpheniramine-HYDROcodone, guaiFENesin, haloperidol lactate, hydrALAZINE, HYDROmorphone (DILAUDID) injection, labetalol, lidocaine, lip balm, LORazepam, morphine, ondansetron (ZOFRAN) IV, oxyCODONE, polyethylene glycol, simethicone, sodium chloride flush   Assessment/Plan   1. Acute hypoxemic respiratory failure: Due to COVID-19 PNA with bilateral infiltrates.  Refractory hypoxemia, VV-ECMO cannulation on 02/27/2020 with improvement in oxygenation.  Developed left PTX post-subclavian CVL and had left chest tube, the left lung is re-expanded and CT out.  He was extubated 1/2 but reintubated 1/4 with agitation and suspected aspiration.  Tracheostomy 1/6.  CXR with bilateral infiltrates, stable today but gradually improved over time. ECMO cannula repositioned 1/5. LDH stable today. Sweep at 5, still cannot get down further due to hypercarbia. Suspect significant dead space ventilation.  Now on Ancef for Group F Strep in sputum, now also with Enterococcus faecalis. I/Os positive with 1 dose of Lasix yesterday.  - Keep sweep at 5 for now  based on last ABG.  - Patient has had remdesivir, tocilizumab. - Completed steroid taper.  - ECMO circuit functioning appropriately. Areas of clotting in oxygenator chronically.  - Continue bivalirudin, goal PTT 65-80. He is at 53 today.  - Will give Lasix 40 mg IV bid today.  Can stop acetazolamide for now with HCO3 29.  - Will order CTA chest for closer evaluation today, concern for significant dead space, ?distal clots.  - Will discuss with pharmacy, looks like we will need to transition Ancef to vancomycin given Enterococcus.  2. RLE DVT/LUE DVT/thrombus in RV/suspect PE: Echo with moderately dilated and moderately dysfunctional RV.  Clot noted on TEE in RV as well.  TTE 1/2 showed normal EF 60-65%, RV improved (mildly dilated/dysfunctional). TEE on 1/5 with moderate to severe RV dysfunction but patient was hypoxemic.  Had 1/2 dose TPA on 1/5. Echo 1/20 with mildly dilated/mildly dysfunctional RV.  - Bivalirudin for goal PTT 65-80.  Management as above 3. Left PTX: Left chest tube, lung is re-expanded. Tube now out, stable CXR. 4. Shock: Suspect septic/distributive.  Now resolved, off NE.  5. Anemia: Hgb 9.1. transfuse < 8. morning.   6. AKI: Resolved.  7. Hyperglycemia: insulin.  8. HTN: Remains elevated, following cuff pressure due to whip in the arterial line.   - On clonidine.  - Continue metoprolol 50 mg bid.  9. CHB: Episode of CHB when hypoxemic and with cough (suspect vagal).  NSR since then.    - Continue metoprolol, watch rhythm.  10. Thrombocytopenia: Resolved  11. Ileus: Resolved. TFs restarted.  - Getting Reglan  - Cor-trak repositioned to post-pyloric placement 12. Ischemic  digits: LUE.  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  Repeat study 1/17 showed no obstruction.  13. ID: Group F Strep in sputum, on Ancef.  Also with Enterococcus faecalis in sputum.  - Would switch to vancomycin.   CRITICAL CARE Performed by: Loralie Champagne  Total critical care  time: 40 minutes  Critical care time was exclusive of separately billable procedures and treating other patients.  Critical care was necessary to treat or prevent imminent or life-threatening deterioration.  Critical care was time spent personally by me on the following activities: development of treatment plan with patient and/or surrogate as well as nursing, discussions with consultants, evaluation of patient's response to treatment, examination of patient, obtaining history from patient or surrogate, ordering and performing treatments and interventions, ordering and review of laboratory studies, ordering and review of radiographic studies, pulse oximetry and re-evaluation of patient's condition.    Length of Stay: Tom Green, MD  04/03/2020, 7:34 AM  Advanced Heart Failure Team Pager [phone] (M-F; 7a - 4p)  Please contact [facility] for night-coverage after hours (4p -7a ) and weekends on [email or web address]

## 2020-04-03 NOTE — Progress Notes (Signed)
1030 Transported to CT. Placed on battery and portable o2. Emergency meds taken. Transported without incident. Returned to room and power plugged into red outlet, heater turned on and o2 hooked up to wall.

## 2020-04-03 NOTE — Procedures (Signed)
Extracorporeal support note  ECLS cannulation date: 12/30  Support day 23 Last circuit change: n/a  Indication: Severe respiratory failure secondary to COVID-19 pneumonia with RV dysfunction.   Configuration: Venovenous  Drainage cannula: 32 French crescent cannula via right IJ Return cannula: Same  Pump speed: 3300 RPM Pump flow: 4.36 L/min Pump used: Cardio help  Oxygenator: Cardio help O2 blender: 100% Sweep gas: 5L  Circuit check: clots at corners of oxygenator L>R, stable Anticoagulant: Bivalirudin Anticoagulation targets: PTT 60-80  Changes in support: Continue current level of support, maintain adequate sweep to prevent hypercarbia.   Anticipated goals/duration of support: Bridge to recovery.  Multidisciplinary ECMO rounds completed.   Lynnell Catalan, MD 04/03/20 2:20 PM [facility]

## 2020-04-03 NOTE — Plan of Care (Signed)
  Problem: Coping: Goal: Psychosocial and spiritual needs will be supported Outcome: Progressing   Problem: Pain Managment: Goal: General experience of comfort will improve Outcome: Progressing   Problem: Elimination: Goal: Will not experience complications related to bowel motility Outcome: Progressing   Problem: Skin Integrity: Goal: Risk for impaired skin integrity will decrease Outcome: Progressing   Problem: Clinical Measurements: Goal: Will remain free from infection Outcome: Not Progressing Note: WBC slowly increasing. Ancef d/c and vanc started Goal: Respiratory complications will improve Outcome: Not Progressing Note: Pt continuing to have fast labored breaths when awake.    Problem: Coping: Goal: Level of anxiety will decrease Outcome: Not Progressing Note: Despite all interventions pt continuing to have episodes of awakening with major anxiety with increase in HR, BP, and RR.

## 2020-04-03 NOTE — Plan of Care (Signed)
  Problem: Education: Goal: Knowledge of risk factors and measures for prevention of condition will improve Outcome: Progressing   Problem: Coping: Goal: Psychosocial and spiritual needs will be supported Outcome: Progressing   Problem: Respiratory: Goal: Will maintain a patent airway Outcome: Progressing Goal: Complications related to the disease process, condition or treatment will be avoided or minimized Outcome: Progressing   Problem: Activity: Goal: Ability to tolerate increased activity will improve Outcome: Progressing   Problem: Respiratory: Goal: Ability to maintain a clear airway and adequate ventilation will improve Outcome: Progressing   Problem: Role Relationship: Goal: Method of communication will improve Outcome: Progressing   Problem: Health Behavior/Discharge Planning: Goal: Ability to manage health-related needs will improve Outcome: Progressing   Problem: Clinical Measurements: Goal: Ability to maintain clinical measurements within normal limits will improve Outcome: Progressing Goal: Will remain free from infection Outcome: Progressing Goal: Diagnostic test results will improve Outcome: Progressing Goal: Respiratory complications will improve Outcome: Progressing Goal: Cardiovascular complication will be avoided Outcome: Progressing   Problem: Activity: Goal: Risk for activity intolerance will decrease Outcome: Progressing   Problem: Coping: Goal: Level of anxiety will decrease Outcome: Progressing   Problem: Pain Managment: Goal: General experience of comfort will improve Outcome: Progressing   Problem: Elimination: Goal: Will not experience complications related to bowel motility Outcome: Progressing Goal: Will not experience complications related to urinary retention Outcome: Progressing   Problem: Safety: Goal: Ability to remain free from injury will improve Outcome: Progressing   Problem: Skin Integrity: Goal: Risk for impaired  skin integrity will decrease Outcome: Progressing   

## 2020-04-04 ENCOUNTER — Inpatient Hospital Stay (HOSPITAL_COMMUNITY): Payer: Medicaid Other

## 2020-04-04 LAB — POCT I-STAT 7, (LYTES, BLD GAS, ICA,H+H)
Acid-Base Excess: 9 mmol/L — ABNORMAL HIGH (ref 0.0–2.0)
Acid-Base Excess: 9 mmol/L — ABNORMAL HIGH (ref 0.0–2.0)
Acid-Base Excess: 10 mmol/L — ABNORMAL HIGH (ref 0.0–2.0)
Acid-Base Excess: 12 mmol/L — ABNORMAL HIGH (ref 0.0–2.0)
Acid-Base Excess: 11 mmol/L — ABNORMAL HIGH (ref 0.0–2.0)
Acid-Base Excess: 9 mmol/L — ABNORMAL HIGH (ref 0.0–2.0)
Acid-Base Excess: 12 mmol/L — ABNORMAL HIGH (ref 0.0–2.0)
Bicarbonate: 35.9 mmol/L — ABNORMAL HIGH (ref 20.0–28.0)
Bicarbonate: 35.6 mmol/L — ABNORMAL HIGH (ref 20.0–28.0)
Bicarbonate: 36.6 mmol/L — ABNORMAL HIGH (ref 20.0–28.0)
Bicarbonate: 38.4 mmol/L — ABNORMAL HIGH (ref 20.0–28.0)
Bicarbonate: 36.7 mmol/L — ABNORMAL HIGH (ref 20.0–28.0)
Bicarbonate: 36.1 mmol/L — ABNORMAL HIGH (ref 20.0–28.0)
Bicarbonate: 37.8 mmol/L — ABNORMAL HIGH (ref 20.0–28.0)
Calcium, Ion: 1.24 mmol/L (ref 1.15–1.40)
Calcium, Ion: 1.24 mmol/L (ref 1.15–1.40)
Calcium, Ion: 1.23 mmol/L (ref 1.15–1.40)
Calcium, Ion: 1.22 mmol/L (ref 1.15–1.40)
Calcium, Ion: 1.23 mmol/L (ref 1.15–1.40)
Calcium, Ion: 1.22 mmol/L (ref 1.15–1.40)
Calcium, Ion: 1.21 mmol/L (ref 1.15–1.40)
HCT: 26 % — ABNORMAL LOW (ref 39.0–52.0)
HCT: 25 % — ABNORMAL LOW (ref 39.0–52.0)
HCT: 27 % — ABNORMAL LOW (ref 39.0–52.0)
HCT: 25 % — ABNORMAL LOW (ref 39.0–52.0)
HCT: 24 % — ABNORMAL LOW (ref 39.0–52.0)
HCT: 25 % — ABNORMAL LOW (ref 39.0–52.0)
HCT: 24 % — ABNORMAL LOW (ref 39.0–52.0)
Hemoglobin: 8.8 g/dL — ABNORMAL LOW (ref 13.0–17.0)
Hemoglobin: 8.5 g/dL — ABNORMAL LOW (ref 13.0–17.0)
Hemoglobin: 9.2 g/dL — ABNORMAL LOW (ref 13.0–17.0)
Hemoglobin: 8.5 g/dL — ABNORMAL LOW (ref 13.0–17.0)
Hemoglobin: 8.2 g/dL — ABNORMAL LOW (ref 13.0–17.0)
Hemoglobin: 8.5 g/dL — ABNORMAL LOW (ref 13.0–17.0)
Hemoglobin: 8.2 g/dL — ABNORMAL LOW (ref 13.0–17.0)
O2 Saturation: 91 %
O2 Saturation: 94 %
O2 Saturation: 93 %
O2 Saturation: 95 %
O2 Saturation: 95 %
O2 Saturation: 94 %
O2 Saturation: 93 %
Patient temperature: 37
Patient temperature: 37
Patient temperature: 37
Patient temperature: 37.1
Patient temperature: 36.9
Patient temperature: 36.8
Patient temperature: 98.4
Potassium: 4.6 mmol/L (ref 3.5–5.1)
Potassium: 4.6 mmol/L (ref 3.5–5.1)
Potassium: 4.2 mmol/L (ref 3.5–5.1)
Potassium: 4.3 mmol/L (ref 3.5–5.1)
Potassium: 4.1 mmol/L (ref 3.5–5.1)
Potassium: 4.4 mmol/L (ref 3.5–5.1)
Potassium: 3.8 mmol/L (ref 3.5–5.1)
Sodium: 146 mmol/L — ABNORMAL HIGH (ref 135–145)
Sodium: 147 mmol/L — ABNORMAL HIGH (ref 135–145)
Sodium: 147 mmol/L — ABNORMAL HIGH (ref 135–145)
Sodium: 146 mmol/L — ABNORMAL HIGH (ref 135–145)
Sodium: 147 mmol/L — ABNORMAL HIGH (ref 135–145)
Sodium: 145 mmol/L (ref 135–145)
Sodium: 146 mmol/L — ABNORMAL HIGH (ref 135–145)
TCO2: 38 mmol/L — ABNORMAL HIGH (ref 22–32)
TCO2: 37 mmol/L — ABNORMAL HIGH (ref 22–32)
TCO2: 38 mmol/L — ABNORMAL HIGH (ref 22–32)
TCO2: 40 mmol/L — ABNORMAL HIGH (ref 22–32)
TCO2: 38 mmol/L — ABNORMAL HIGH (ref 22–32)
TCO2: 38 mmol/L — ABNORMAL HIGH (ref 22–32)
TCO2: 40 mmol/L — ABNORMAL HIGH (ref 22–32)
pCO2 arterial: 66.8 mmHg (ref 32.0–48.0)
pCO2 arterial: 59.9 mmHg — ABNORMAL HIGH (ref 32.0–48.0)
pCO2 arterial: 58.9 mmHg — ABNORMAL HIGH (ref 32.0–48.0)
pCO2 arterial: 60.5 mmHg — ABNORMAL HIGH (ref 32.0–48.0)
pCO2 arterial: 54.6 mmHg — ABNORMAL HIGH (ref 32.0–48.0)
pCO2 arterial: 63.7 mmHg — ABNORMAL HIGH (ref 32.0–48.0)
pCO2 arterial: 58.5 mmHg — ABNORMAL HIGH (ref 32.0–48.0)
pH, Arterial: 7.338 — ABNORMAL LOW (ref 7.350–7.450)
pH, Arterial: 7.383 (ref 7.350–7.450)
pH, Arterial: 7.402 (ref 7.350–7.450)
pH, Arterial: 7.411 (ref 7.350–7.450)
pH, Arterial: 7.436 (ref 7.350–7.450)
pH, Arterial: 7.36 (ref 7.350–7.450)
pH, Arterial: 7.417 (ref 7.350–7.450)
pO2, Arterial: 68 mmHg — ABNORMAL LOW (ref 83.0–108.0)
pO2, Arterial: 73 mmHg — ABNORMAL LOW (ref 83.0–108.0)
pO2, Arterial: 69 mmHg — ABNORMAL LOW (ref 83.0–108.0)
pO2, Arterial: 77 mmHg — ABNORMAL LOW (ref 83.0–108.0)
pO2, Arterial: 77 mmHg — ABNORMAL LOW (ref 83.0–108.0)
pO2, Arterial: 76 mmHg — ABNORMAL LOW (ref 83.0–108.0)
pO2, Arterial: 68 mmHg — ABNORMAL LOW (ref 83.0–108.0)

## 2020-04-04 LAB — TYPE AND SCREEN
ABO/RH(D): B POS
Antibody Screen: NEGATIVE
Unit division: 0
Unit division: 0
Unit division: 0
Unit division: 0
Unit division: 0

## 2020-04-04 LAB — BASIC METABOLIC PANEL
Anion gap: 9 (ref 5–15)
Anion gap: 9 (ref 5–15)
BUN: 49 mg/dL — ABNORMAL HIGH (ref 6–20)
BUN: 52 mg/dL — ABNORMAL HIGH (ref 6–20)
CO2: 33 mmol/L — ABNORMAL HIGH (ref 22–32)
CO2: 33 mmol/L — ABNORMAL HIGH (ref 22–32)
Calcium: 8.8 mg/dL — ABNORMAL LOW (ref 8.9–10.3)
Calcium: 8.6 mg/dL — ABNORMAL LOW (ref 8.9–10.3)
Chloride: 105 mmol/L (ref 98–111)
Chloride: 103 mmol/L (ref 98–111)
Creatinine, Ser: 0.8 mg/dL (ref 0.61–1.24)
Creatinine, Ser: 0.8 mg/dL (ref 0.61–1.24)
GFR, Estimated: >60 mL/min (ref >60)
GFR, Estimated: >60 mL/min (ref >60)
Glucose, Bld: 197 mg/dL — ABNORMAL HIGH (ref 70–99)
Glucose, Bld: 150 mg/dL — ABNORMAL HIGH (ref 70–99)
Potassium: 4.6 mmol/L (ref 3.5–5.1)
Potassium: 4.4 mmol/L (ref 3.5–5.1)
Sodium: 147 mmol/L — ABNORMAL HIGH (ref 135–145)
Sodium: 145 mmol/L (ref 135–145)

## 2020-04-04 LAB — BPAM RBC
Blood Product Expiration Date: 202202042359
Blood Product Expiration Date: 202202092359
Blood Product Expiration Date: 202202092359
Blood Product Expiration Date: 202202092359
Blood Product Expiration Date: 202202092359
ISSUE DATE / TIME: 202201210428
Unit Type and Rh: 7300
Unit Type and Rh: 7300
Unit Type and Rh: 7300
Unit Type and Rh: 7300
Unit Type and Rh: 7300

## 2020-04-04 LAB — PROTIME-INR
INR: 1.5 — ABNORMAL HIGH (ref 0.8–1.2)
Prothrombin Time: 17.1 seconds — ABNORMAL HIGH (ref 11.4–15.2)

## 2020-04-04 LAB — CBC
HCT: 30.6 % — ABNORMAL LOW (ref 39.0–52.0)
HCT: 27.3 % — ABNORMAL LOW (ref 39.0–52.0)
Hemoglobin: 9 g/dL — ABNORMAL LOW (ref 13.0–17.0)
Hemoglobin: 8.1 g/dL — ABNORMAL LOW (ref 13.0–17.0)
MCH: 29.1 pg (ref 26.0–34.0)
MCH: 29.6 pg (ref 26.0–34.0)
MCHC: 29.4 g/dL — ABNORMAL LOW (ref 30.0–36.0)
MCHC: 29.7 g/dL — ABNORMAL LOW (ref 30.0–36.0)
MCV: 99 fL (ref 80.0–100.0)
MCV: 99.6 fL (ref 80.0–100.0)
Platelets: 239 10*3/uL (ref 150–400)
Platelets: 232 10*3/uL (ref 150–400)
RBC: 3.09 MIL/uL — ABNORMAL LOW (ref 4.22–5.81)
RBC: 2.74 MIL/uL — ABNORMAL LOW (ref 4.22–5.81)
RDW: 19.3 % — ABNORMAL HIGH (ref 11.5–15.5)
RDW: 19 % — ABNORMAL HIGH (ref 11.5–15.5)
WBC: 12.8 10*3/uL — ABNORMAL HIGH (ref 4.0–10.5)
WBC: 11.7 10*3/uL — ABNORMAL HIGH (ref 4.0–10.5)
nRBC: 0.9 % — ABNORMAL HIGH (ref 0.0–0.2)
nRBC: 1.3 % — ABNORMAL HIGH (ref 0.0–0.2)

## 2020-04-04 LAB — GLUCOSE, CAPILLARY
Glucose-Capillary: 183 mg/dL — ABNORMAL HIGH (ref 70–99)
Glucose-Capillary: 172 mg/dL — ABNORMAL HIGH (ref 70–99)
Glucose-Capillary: 160 mg/dL — ABNORMAL HIGH (ref 70–99)
Glucose-Capillary: 145 mg/dL — ABNORMAL HIGH (ref 70–99)
Glucose-Capillary: 123 mg/dL — ABNORMAL HIGH (ref 70–99)

## 2020-04-04 LAB — LACTIC ACID, PLASMA
Lactic Acid, Venous: 1.1 mmol/L (ref 0.5–1.9)
Lactic Acid, Venous: 1.8 mmol/L (ref 0.5–1.9)

## 2020-04-04 LAB — APTT
aPTT: 72 seconds — ABNORMAL HIGH (ref 24–36)
aPTT: 80 seconds — ABNORMAL HIGH (ref 24–36)

## 2020-04-04 LAB — FIBRINOGEN: Fibrinogen: 698 mg/dL — ABNORMAL HIGH (ref 210–475)

## 2020-04-04 LAB — LACTATE DEHYDROGENASE: LDH: 588 U/L — ABNORMAL HIGH (ref 98–192)

## 2020-04-04 MED ORDER — PROPOFOL 10 MG/ML IV BOLUS: 60.0000 mg | Freq: Once | INTRAVENOUS | Status: AC

## 2020-04-04 MED ORDER — MELATONIN 5 MG PO TABS
10.0000 mg | ORAL_TABLET | Freq: Every day | ORAL | Status: DC
  Administered 2020-04-04 – 2020-04-12 (×9): 10 mg
  Filled 2020-04-04 (×9): qty 2

## 2020-04-04 MED ORDER — CLONAZEPAM 1 MG PO TABS
3.0000 mg | ORAL_TABLET | Freq: Every day | ORAL | Status: DC
  Administered 2020-04-04 – 2020-04-06 (×3): 3 mg
  Administered 2020-04-07: 1 mg
  Administered 2020-04-08 – 2020-04-17 (×10): 3 mg
  Filled 2020-04-04 (×14): qty 3

## 2020-04-04 MED ORDER — CHLORHEXIDINE GLUCONATE 0.12 % MT SOLN
OROMUCOSAL | Status: AC
  Administered 2020-04-04: 15 mL via OROMUCOSAL
  Filled 2020-04-04: qty 15

## 2020-04-04 MED ORDER — PROPOFOL 1000 MG/100ML IV EMUL
5.0000 ug/kg/min | INTRAVENOUS | Status: DC
  Administered 2020-04-04: 60 ug/kg/min via INTRAVENOUS
  Administered 2020-04-04: 70 ug/kg/min via INTRAVENOUS
  Administered 2020-04-04: 5 ug/kg/min via INTRAVENOUS
  Administered 2020-04-05: 60 ug/kg/min via INTRAVENOUS
  Administered 2020-04-05: 50 ug/kg/min via INTRAVENOUS
  Administered 2020-04-05: 65 ug/kg/min via INTRAVENOUS
  Administered 2020-04-05: 70 ug/kg/min via INTRAVENOUS
  Administered 2020-04-05: 65 ug/kg/min via INTRAVENOUS
  Administered 2020-04-06: 40 ug/kg/min via INTRAVENOUS
  Filled 2020-04-04: qty 200
  Filled 2020-04-04: qty 100
  Filled 2020-04-04: qty 200
  Filled 2020-04-04: qty 100
  Filled 2020-04-04: qty 200
  Filled 2020-04-04 (×2): qty 100

## 2020-04-04 MED ORDER — QUETIAPINE FUMARATE 100 MG PO TABS
200.0000 mg | ORAL_TABLET | Freq: Every day | ORAL | Status: DC
  Administered 2020-04-04 – 2020-04-12 (×9): 200 mg
  Filled 2020-04-04 (×9): qty 2

## 2020-04-04 MED ORDER — OXYCODONE HCL 5 MG PO TABS
15.0000 mg | ORAL_TABLET | Freq: Four times a day (QID) | ORAL | Status: DC
Start: 2020-04-04 — End: 2020-04-24
  Administered 2020-04-04 – 2020-04-24 (×65): 15 mg
  Filled 2020-04-04 (×68): qty 3

## 2020-04-04 MED ORDER — CLONAZEPAM 1 MG PO TABS
1.0000 mg | ORAL_TABLET | Freq: Every day | ORAL | Status: DC
  Administered 2020-04-05 – 2020-04-17 (×13): 1 mg
  Filled 2020-04-04 (×13): qty 1

## 2020-04-04 MED ORDER — PROPOFOL 10 MG/ML IV BOLUS
INTRAVENOUS | Status: AC
  Administered 2020-04-04: 200 mg
  Filled 2020-04-04: qty 20

## 2020-04-04 MED ORDER — QUETIAPINE FUMARATE 50 MG PO TABS
50.0000 mg | ORAL_TABLET | Freq: Every day | ORAL | Status: DC
  Administered 2020-04-04 – 2020-04-06 (×3): 50 mg
  Filled 2020-04-04 (×3): qty 1

## 2020-04-04 NOTE — Procedures (Signed)
Extracorporeal support note  ECLS cannulation date: 12/30  Support day 25 Last circuit change: n/a  Indication: Severe respiratory failure secondary to COVID-19 pneumonia with RV dysfunction.   Configuration: Venovenous  Drainage cannula: 32 French crescent cannula via right IJ Return cannula: Same  Pump speed: 3400 RPM Pump flow: 4.5 L/min Pump used: Cardio help  Oxygenator: Cardio help O2 blender: 100% Sweep gas: 5L  Circuit check: clots at corners of oxygenator L>R, stable Anticoagulant: Bivalirudin Anticoagulation targets: PTT 60-80  Changes in support: Continue current level of support, maintain adequate sweep to prevent hypercarbia. Target pCO2 45-55  Anticipated goals/duration of support: Bridge to recovery.  Multidisciplinary ECMO rounds completed.   Lynnell Catalan, MD 04/04/20 11:59 AM [facility]

## 2020-04-04 NOTE — Procedures (Signed)
Bronchoscopy Procedure Note  Jaxston Chohan  [id number]  Feb 28, 1971  Date:04/04/20  Time:1:10 PM   Provider Performing:Chastin Riesgo   Procedure(s):  Flexible Bronchoscopy (53976)  Indication(s) Assessment of tracheostomy positioning.  Consent Risks of the procedure as well as the alternatives and risks of each were explained to the patient and/or caregiver.  Consent for the procedure was obtained and is signed in the bedside chart  Anesthesia Propofol 60 mg   Time Out Verified patient identification, verified procedure, site/side was marked, verified correct patient position, special equipment/implants available, medications/allergies/relevant history reviewed, required imaging and test results available.   Sterile Technique Usual hand hygiene, masks, gowns, and gloves were used   Procedure Description     Bronchoscope advanced through  and into airway.    Findings: End of the tracheostomy was found to abut against the wall of the trachea.  The distal XLT was exchanged for a standard tracheostomy.  A small polypoid mass was then noted to be prolapsing into the lumen of the tracheostomy, likely explaining the patient's cough.  We will keep him sedated overnight and consult ENT in the morning regarding further management.  Complications/Tolerance None; patient tolerated the procedure well. Chest X-ray is not needed post procedure.  Kipp Brood, MD [facility] ICU Physician [place]  Pager: [phone] Or Epic Secure Chat After hours: [phone].  04/04/2020, 1:13 PM

## 2020-04-04 NOTE — Progress Notes (Signed)
ECMO PROGRESS NOTE  NAME:  Alex [name], MRN:  [id number], DOB:  [birth year], LOS: 26 ADMISSION DATE:  02/12/2020, CONSULTATION DATE: 03-20-2020 REFERRING MD: Wynona Neat -LBPCCM, CHIEF COMPLAINT: Respiratory failure requiring ECMO  HPI/course in hospital  50 year old man admitted to hospital 12/28 with 1 week history of dyspnea cough nausea and vomiting.  Initially admitted to [facility] long hospital and placed on high flow nasal cannula but rapidly failed and required intubation 12/29.  Persistent hypoxic respiratory failure with PF ratio 55 in spite of 18 of PEEP FiO2 0.1 despite paralytics.  Did not improve with prone ventilation  Cannulated for VV ECMO 12/30 via right IJ crescent cannula. Iatrogenic pneumothorax from left subclavian triple-lumen placement  Family confirms no significant past medical history apart from possible prediabetes  Past Medical History  none  Interim history/subjective:   CT chest shows only small residual clot, not amenable to intervention.  Continues to have significant cough.  Agitation particularly at nighttime requiring additional doses of as needed medication.  Objective   Blood pressure (!) 92/55, pulse (!) 106, temperature 98.6 F (37 C), temperature source Core, resp. rate (!) 21, height 5\' 4"  (1.626 m), weight 63.2 kg, SpO2 100 %.    Vent Mode: PCV FiO2 (%):  [50 %] 50 % Set Rate:  [20 bmp] 20 bmp PEEP:  [8 cmH20-10 cmH20] 10 cmH20 Plateau Pressure:  [29 cmH20-30 cmH20] 30 cmH20   Intake/Output Summary (Last 24 hours) at 04/04/2020 1203 Last data filed at 04/04/2020 1145 Gross per 24 hour  Intake 3880.78 ml  Output 3755 ml  Net 125.78 ml   Filed Weights   04/02/20 0500 04/03/20 0500 04/04/20 0500  Weight: 62.6 kg 63.5 kg 63.2 kg    ECMO Device: Cardiohelp  ECMO Mode: VV  Flow (LPM): 4.5   Examination: Constitutional: Continued anxiety Eyes: EOMI, pupils equal Ears, nose, mouth, and throat: trach in place with small bloody  secretions Cardiovascular: tachycardic, ext warm Respiratory: crackles at bases L>R  Gastrointestinal: soft, +BS, cortrak in place, rectal tube in place Skin: No rashes, normal turgor, line site intact. Blistering left hand.  Neurologic: moves all 4 ext  Psychiatric: When calm, able to follow commands.  Generalized weakness.  Muscle atrophy.   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  Anxiety, air hunger, intermittent delirium- ongoing issue complicating sweep weans Gastroparesis with some element ileus Ischemic changes L hand Muscular deconditioning DM2 with hyperglycemia  Plan:  - Continue VV ECMO support  - Continue ultra lung-protective ventilation - follow for spontaneous Vt recovery - Major difficulty at this time is high dead-space ventilation - Anxiety and delirium are also affecting ability to wean sweep - continue current sedation strategy and increase nighttime dosing to encourage normal sleep-wake cycle. - No intervention for PE apart from anticoagulation given small distal chronic appearing clots.  -We will perform bronchoscopy to evaluate whether tracheostomy positioning is causing cough.  Daily Goals Checklist  Pain/Anxiety/Delirium protocol (if indicated): ketamine IV, enteral lorazepam, valproate, hydromorphone prn,  oxycodone, clonidine, quetiapine. Add melatonin at night. Will change dosing to give more at night in a 1/3 to 2/3 ratio.  VAP protocol (if indicated): bundle in place.  Respiratory support goals: Rest settings. Continue nebulized morphine and lidocaine for cough.  Blood pressure target: metoprolol for BP and HR DVT  prophylaxis: bivalirudin Nutrition Status:  Tolerating tube feeds, will decrease laxatives and add probiotic GI  prophylaxis: pantoprazole Fluid status goals: appears euvolemic. Urinary catheter: foley catheter due to retention.  Trial of Benechol Central lines: Left [place] TLC intact Glucose control: Euglcyemic on combination of SSI and Basal insulin. Mobility/therapy needs: Mobilization as tolerated.  Antibiotic de-escalation: Complete 7 days of vancomycin Home medication reconciliation: on hold Daily labs: per - ECMO protocol  Code Status: Full code Family Communication: Son was updated 1/22 Disposition: ICU.    CRITICAL CARE Performed by: Lynnell Catalan   Total critical care time: 40 minutes  Critical care time was exclusive of separately billable procedures and treating other patients.  Critical care was necessary to treat or prevent imminent or life-threatening deterioration.  Critical care was time spent personally by me on the following activities: development of treatment plan with patient and/or surrogate as well as nursing, discussions with consultants, evaluation of patient's response to treatment, examination of patient, obtaining history from patient or surrogate, ordering and performing treatments and interventions, ordering and review of laboratory studies, ordering and review of radiographic studies, pulse oximetry, re-evaluation of patient's condition and participation in multidisciplinary rounds.  Lynnell Catalan, MD [facility] ICU Physician [facility]  Pager: [phone] Mobile: [phone] After hours: [phone].

## 2020-04-04 NOTE — Plan of Care (Signed)
  Problem: Pain Managment: Goal: General experience of comfort will improve Outcome: Progressing   Problem: Elimination: Goal: Will not experience complications related to bowel motility Outcome: Progressing Note: Flexi removed and rectal pouch placed   Problem: Clinical Measurements: Goal: Respiratory complications will improve Outcome: Not Progressing Note: Violent cough r/t polyp tissue in trachea causing vagal episodes. Orders to keep pt sedated and comfortable through the night until ENT consulted in AM    Problem: Coping: Goal: Level of anxiety will decrease Outcome: Not Progressing   Problem: Skin Integrity: Goal: Risk for impaired skin integrity will decrease Outcome: Not Progressing Note: New stage 2 found on rectum from prolonged use of flexi

## 2020-04-04 NOTE — Progress Notes (Signed)
Patient ID: Alex [name], male   DOB: Feb 20, 1971, 50 y.o.   MRN: [id number]     Advanced Heart Failure Rounding Note  PCP-Cardiologist: No primary care provider on file.   Subjective:    - 12/30: VV ECMO cannulation - 12/31: Left chest tube replaced - 1/2: Extubated. Echo with EF 60-65%, mildly dilated RV with mildly decreased systolic function.  - 1/4: Agitated, suspected aspiration.  Re-intubated.  - 1/5: ECMO cannula repositioned under TEE guidance. TEE showed moderately dilated/moderate-severely dysfunctional RV in setting of hypoxemia. LUE DVT found.  Patient got 1/2 dose of TPA due to initial concern for large PE.  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  - 1/6: Tracheostomy - 1/7: Echo with mild RV dilation/mild RV dysfunction.  - 1/16: Left chest tube out - 1/17: LUE arterial dopplers repeated, showed no obstruction.  - 1/20: Echo with EF 65-70%, mildly D-shaped septum, mildly dilated and mildly dysfunctional RV.  - 1/22: CTA chest: Bilateral upper lobe PEs (suspect chronic), changes c/w ARDS  Sweep at 5 this morning, has been delirious/uncomfortable, PaCO2 67 though pH compensated 7.34.   Vancomycin for Group F Strep and Enterococcus faecalis in trach aspirate.   I/Os even with Lasix + acetazolamide.      ECMO parameters: 3300 rpm Flow 4.35 L/min Pvenous -65 Delta P 27 Sweep 5  ABG 7.34/67/68/91% LDH  998 => 1305 => 901 => 1078 => 1,170 => 1,117 => 911 => 751 => 699 => 691 => 737 => 671 => 629 => 649 => 629 => 588 Lactate 1.1 PTT 72  Objective:   Weight Range: 63.2 kg Body mass index is 23.92 kg/m.   Vital Signs:   Temp:  [98.1 F (36.7 C)-98.8 F (37.1 C)] 98.1 F (36.7 C) (01/23 0400) Pulse Rate:  [97-127] 115 (01/23 0600) Resp:  [14-48] 17 (01/23 0600) BP: (86-187)/(59-107) 130/82 (01/23 0600) SpO2:  [94 %-100 %] 99 % (01/23 0600) Arterial Line BP: (119-268)/(53-121) 166/65 (01/23 0600) FiO2 (%):  [50 %] 50 % (01/23  0400) Weight:  [63.2 kg] 63.2 kg (01/23 0500) Last BM Date: 04/03/20  Weight change: Filed Weights   04/02/20 0500 04/03/20 0500 04/04/20 0500  Weight: 62.6 kg 63.5 kg 63.2 kg    Intake/Output:   Intake/Output Summary (Last 24 hours) at 04/04/2020 0746 Last data filed at 04/04/2020 0438 Gross per 24 hour  Intake 3794.29 ml  Output 3590 ml  Net 204.29 ml      Physical Exam    General: NAD, sleeping Neck: Tracheostomy. No JVD, no thyromegaly or thyroid nodule.  Lungs: Decreased at bases.  CV: Nondisplaced PMI.  Heart regular S1/S2, no S3/S4, no murmur.  No peripheral edema.   Abdomen: Soft, nontender, no hepatosplenomegaly, no distention.  Skin: Intact without lesions or rashes.  Neurologic: Delirium during day yesterday.  Extremities: Dry gangrene tips of left hand digits.  HEENT: Normal.    Telemetry   NSR 110s Personally reviewed   Labs    CBC Recent Labs    04/03/20 1615 04/03/20 1937 04/04/20 0413 04/04/20 0415  WBC 12.3*  --  12.8*  --   HGB 8.8*   < > 9.0* 8.8*  HCT 29.2*   < > 30.6* 26.0*  MCV 98.3  --  99.0  --   PLT 230  --  239  --    < > = values in this interval not displayed.   Basic Metabolic Panel Recent Labs    04/01/20 1630 04/01/20  1958 04/03/20 1615 04/03/20 1937 04/04/20 0413 04/04/20 0415  NA 146*   < > 144   < > 147* 146*  K 3.8   < > 4.5   < > 4.6 4.6  CL 103   < > 104  --  105  --   CO2 34*   < > 28  --  33*  --   GLUCOSE 138*   < > 156*  --  197*  --   BUN 49*   < > 46*  --  49*  --   CREATININE 0.82   < > 0.76  --  0.80  --   CALCIUM 9.1   < > 8.6*  --  8.8*  --   MG 2.5*  --   --   --   --   --    < > = values in this interval not displayed.   Liver Function Tests No results for input(s): AST, ALT, ALKPHOS, BILITOT, PROT, ALBUMIN in the last 72 hours. No results for input(s): LIPASE, AMYLASE in the last 72 hours. Cardiac Enzymes No results for input(s): CKTOTAL, CKMB, CKMBINDEX, TROPONINI in the last 72  hours.  BNP: BNP (last 3 results) No results for input(s): BNP in the last 8760 hours.  ProBNP (last 3 results) No results for input(s): PROBNP in the last 8760 hours.   D-Dimer No results for input(s): DDIMER in the last 72 hours. Hemoglobin A1C No results for input(s): HGBA1C in the last 72 hours. Fasting Lipid Panel No results for input(s): CHOL, HDL, LDLCALC, TRIG, CHOLHDL, LDLDIRECT in the last 72 hours. Thyroid Function Tests No results for input(s): TSH, T4TOTAL, T3FREE, THYROIDAB in the last 72 hours.  Invalid input(s): FREET3  Other results:   Imaging    CT ANGIO CHEST PE W OR WO CONTRAST  Result Date: 04/03/2020 CLINICAL DATA:  Acute hypoxic respiratory failure secondary to COVID-19 pneumonia. ECMO on 02/25/2020. Left pneumothorax extubated 03/14/2020 and reintubated on 03/16/2020. EXAM: CT ANGIOGRAPHY CHEST WITH CONTRAST TECHNIQUE: Multidetector CT imaging of the chest was performed using the standard protocol during bolus administration of intravenous contrast. Multiplanar CT image reconstructions and MIPs were obtained to evaluate the vascular anatomy. CONTRAST:  118m OMNIPAQUE IOHEXOL 350 MG/ML SOLN COMPARISON:  Multiple chest radiographs, including earlier today. FINDINGS: Cardiovascular: The quality of this exam for evaluation of pulmonary embolism is poor to moderate. The bolus is centered in the SVC. Mild motion degradation. Patient arm position, not raised above the head. Small volume pulmonary emboli are identified, including in the right upper lobe segmental branch on 139 through 143 of series 6. Within a subsegmental left upper lobe branch including on 136/6. Normal aortic caliber. Mild cardiomegaly. ECMO catheter from an internal jugular approach extends beyond the inferior portion of the exam. Left-sided central line terminates at the low SVC. Mediastinum/Nodes: Right paratracheal node of 1.1 cm on 54/5. Node within the azygoesophageal recess measures 1.5 cm on  6076/5. Lungs/Pleura: Trace bilateral pleural fluid. Fluid tracking within the right major fissure. Tracheostomy appropriately positioned. Diffuse airspace and ground-glass opacity bilaterally. Left greater than right base bronchiectasis, cystic bronchiectasis at the left lung base on 115/7. No pneumothorax. Upper Abdomen: Normal imaged portions of the liver, spleen, adrenal glands, kidneys. Nasogastric tube extends beyond the inferior aspect of the exam. Musculoskeletal: Subcutaneous and intramuscular edema about the anterior left chest wall, including on 70/5. Review of the MIP images confirms the above findings. IMPRESSION: 1. Small volume bilateral upper lobe pulmonary emboli. Exam  limitations as detailed above. 2. Diffuse interstitial and airspace disease, likely related to COVID-19 pneumonia with suspicion of developing superimposed ARDS, including cystic bronchiectasis in the left lower lobe. 3. Small bilateral pleural effusions. 4. Thoracic adenopathy, likely reactive. These results will be called to the ordering clinician or representative by the Radiologist Assistant, and communication documented in the PACS or [employer]. Electronically Signed   By: Abigail Miyamoto M.D.   On: 04/03/2020 11:25   DG CHEST PORT 1 VIEW  Result Date: 04/04/2020 CLINICAL DATA:  COVID pneumonia, respiratory failure EXAM: PORTABLE CHEST 1 VIEW COMPARISON:  None. FINDINGS: Tracheostomy, nasoenteric feeding tube extending into the upper abdomen beyond the margin of the examination, left internal jugular central venous catheter with its tip within the superior vena cava, and right internal jugular ECMO catheter with its middle marker overlying the right atrium are all unchanged. Pulmonary insufflation is normal, symmetric, and stable. Extensive diffuse airspace infiltrate is stable with numerous air bronchograms seen at the left lung base and a a cavitary lesion noted within the left lung base. No pneumothorax or pleural  effusion. Cardiac size within normal limits. IMPRESSION: Stable support lines and tubes. Stable diffuse airspace infiltrate with more focal consolidation at the left lung base and associated left basilar cavitary lesion. Electronically Signed   By: Fidela Salisbury MD   On: 04/04/2020 07:11     Medications:     Scheduled Medications: . bethanechol  10 mg Per Tube TID  . chlorhexidine gluconate (MEDLINE KIT)  15 mL Mouth Rinse BID  . Chlorhexidine Gluconate Cloth  6 each Topical Daily  . clonazePAM  2 mg Per Tube Q6H  . cloNIDine  0.2 mg Per Tube Q8H  . docusate  100 mg Per Tube BID  . fiber  1 packet Per Tube BID  . free water  200 mL Per Tube Q4H  . furosemide  40 mg Intravenous BID  . guaiFENesin-dextromethorphan  10 mL Per Tube BID  . insulin aspart  0-20 Units Subcutaneous Q4H  . insulin aspart  6 Units Subcutaneous Q4H  . insulin detemir  35 Units Subcutaneous BID  . lactobacillus  1 g Per Tube TID WC  . mouth rinse  15 mL Mouth Rinse 10 times per day  . metoprolol tartrate  50 mg Per Tube BID  . nitroGLYCERIN  0.5 inch Topical Q6H  . oxyCODONE  20 mg Per Tube Q6H  . pantoprazole sodium  40 mg Per Tube QHS  . QUEtiapine  200 mg Per Tube TID  . sennosides  10 mL Per Tube QHS  . sodium chloride flush  10-40 mL Intracatheter Q12H  . valproic acid  250 mg Per Tube QHS    Infusions: . sodium chloride    . sodium chloride Stopped (03/23/20 0951)  . sodium chloride 5 mL/hr at 04/04/20 0438  . sodium chloride Stopped (03/12/20 0131)  . albumin human Stopped (03/31/20 1745)  . bivalirudin (ANGIOMAX) infusion 0.5 mg/mL (Non-ACS indications) 0.08 mg/kg/hr (04/04/20 0438)  . feeding supplement (PIVOT 1.5 CAL) 1,000 mL (04/04/20 0706)  . ketamine (KETALAR) Adult IV Infusion 0.5 mg/kg/hr (04/04/20 0510)  . vancomycin Stopped (04/03/20 2105)    PRN Medications: Place/Maintain arterial line **AND** sodium chloride, sodium chloride, sodium chloride, acetaminophen (TYLENOL) oral liquid  160 mg/5 mL, albumin human, chlorpheniramine-HYDROcodone, guaiFENesin, haloperidol lactate, hydrALAZINE, HYDROmorphone (DILAUDID) injection, labetalol, lidocaine, lip balm, LORazepam, morphine, neomycin-bacitracin-polymyxin, ondansetron (ZOFRAN) IV, oxyCODONE, polyethylene glycol, simethicone, sodium chloride flush   Assessment/Plan   1. Acute  hypoxemic respiratory failure: Due to COVID-19 PNA with bilateral infiltrates.  Refractory hypoxemia, VV-ECMO cannulation on 02/15/2020 with improvement in oxygenation.  Developed left PTX post-subclavian CVL and had left chest tube, the left lung is re-expanded and CT out.  He was extubated 1/2 but reintubated 1/4 with agitation and suspected aspiration.  Tracheostomy 1/6.  CTA chest 1/22 with suspected chronic PEs and ARDS. ECMO cannula repositioned 1/5. LDH stable today. Sweep at 5, still cannot get down further due to hypercarbia from significant dead space ventilation.  Now on vancomycin for Group F Strep and Enterococcus faecalis in trach aspirate.  I/Os even on current Lasix.   - Increase sweep to 8 with persistent hypercarbia to rest patient (uncomfortable with high work of breathing).  - Patient has had remdesivir, tocilizumab. - Completed steroid taper.  - ECMO circuit functioning appropriately. Areas of clotting in oxygenator chronically.  - Continue bivalirudin, goal PTT 65-80. He is at 56 today.  - Will continue Lasix 40 mg IV bid today.  2. 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  Clot noted on TEE in RV as well.  TTE 1/2 showed normal EF 60-65%, RV improved (mildly dilated/dysfunctional). TEE on 1/5 with moderate to severe RV dysfunction but patient was hypoxemic.  Had 1/2 dose TPA on 1/5. Echo 1/20 with mildly dilated/mildly dysfunctional RV. CTA chest 1/22 with chronic-appearing PEs in upper lobes. - Bivalirudin for goal PTT 65-80.  Management as above 3. Left PTX: Left chest tube,  lung is re-expanded. Tube now out, stable CXR. 4. Shock: Suspect septic/distributive.  Now resolved, off NE.  5. Anemia: Hgb 8.6. transfuse < 8. morning.   6. AKI: Resolved.  7. Hyperglycemia: insulin.  8. HTN: Remains elevated, following cuff pressure due to whip in the arterial line.   - On clonidine.  - Continue metoprolol 50 mg bid.  9. CHB: Episode of CHB when hypoxemic and with cough (suspect vagal).  NSR since then.    - Continue metoprolol, watch rhythm.  10. Thrombocytopenia: Resolved  11. Ileus: Resolved. TFs restarted.  - Getting Reglan  - Cor-trak repositioned to post-pyloric placement 12. Ischemic digits: LUE.  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  Repeat study 1/17 showed no obstruction.  13. ID: Group F Strep in sputum.  Also with Enterococcus faecalis in sputum.  - On vancomycin.   CRITICAL CARE Performed by: Loralie Champagne  Total critical care time: 40 minutes  Critical care time was exclusive of separately billable procedures and treating other patients.  Critical care was necessary to treat or prevent imminent or life-threatening deterioration.  Critical care was time spent personally by me on the following activities: development of treatment plan with patient and/or surrogate as well as nursing, discussions with consultants, evaluation of patient's response to treatment, examination of patient, obtaining history from patient or surrogate, ordering and performing treatments and interventions, ordering and review of laboratory studies, ordering and review of radiographic studies, pulse oximetry and re-evaluation of patient's condition.    Length of Stay: [place], MD  04/04/2020, 7:46 AM  Advanced Heart Failure Team Pager [phone] (M-F; 7a - 4p)  Please contact [facility] for night-coverage after hours (4p -7a ) and weekends on [email or web address]

## 2020-04-04 NOTE — Progress Notes (Signed)
Remaining 74mL of ketamine infusion wasted in steri cycle with Holland Falling RN

## 2020-04-04 NOTE — Progress Notes (Signed)
ANTICOAGULATION CONSULT NOTE - Follow Up Consult  Pharmacy Consult for bivalirudin Indication: ECMO and VTE  Labs: Recent Labs    04/02/20 0347 04/02/20 0350 04/03/20 0311 04/03/20 0312 04/03/20 1615 04/03/20 1937 04/04/20 0413 04/04/20 0415 04/04/20 1408 04/04/20 1627 04/04/20 1629  HGB 7.9*   < > 9.1*   < > 8.8*   < > 9.0*   < > 8.2* 8.1* 8.5*  HCT 26.7*   < > 30.1*   < > 29.2*   < > 30.6*   < > 24.0* 27.3* 25.0*  PLT 202   < > 226  --  230  --  239  --   --  232  --   APTT 76*   < > 69*  --  80*  --  72*  --   --  80*  --   LABPROT 18.6*  --  17.6*  --   --   --  17.1*  --   --   --   --   INR 1.6*  --  1.5*  --   --   --  1.5*  --   --   --   --   CREATININE 0.80   < > 0.83  --  0.76  --  0.80  --   --  0.80  --    < > = values in this interval not displayed.    Assessment: 75 yoM admitted with COVID-19 PNA with worsening hypoxia, s/p cannulation for ECMO. Pt was started on IV heparin prior to cannulation due to acute DVTs and possible PE, transitioned to bivalirudin with ECMO. Now s/p tPA on 1/5 and tracheostomy on 1/6.   APTT is therapeutic at 80, on bivalirudin@0 .08 mg/kg/hr. Hgb 8.8, plt 232. Fibrinogen 698, LDH 588. No s/sx of bleeding or infusion issues. Circuit stable.  Goal of Therapy:  aPTT 60-80 seconds   Plan:  Continue bivalirudin to 0.08 mg/kg/hr (using order-specific wt 72.1kg) Monitor q12h aPTT/CBC, LDH, and for s/sx of bleeding   Jenetta Downer, [facility] Clinical Pharmacist  04/04/2020 5:21 PM   [facility] pharmacy phone numbers are listed on [email or web address]

## 2020-04-04 NOTE — Progress Notes (Signed)
ANTICOAGULATION CONSULT NOTE - Follow Up Consult  Pharmacy Consult for bivalirudin Indication: ECMO and VTE  Labs: Recent Labs    04/02/20 0347 04/02/20 0350 04/03/20 0311 04/03/20 0312 04/03/20 1615 04/03/20 1937 04/04/20 0413 04/04/20 0415  HGB 7.9*   < > 9.1*   < > 8.8* 9.5* 9.0* 8.8*  HCT 26.7*   < > 30.1*   < > 29.2* 28.0* 30.6* 26.0*  PLT 202   < > 226  --  230  --  239  --   APTT 76*   < > 69*  --  80*  --  72*  --   LABPROT 18.6*  --  17.6*  --   --   --  17.1*  --   INR 1.6*  --  1.5*  --   --   --  1.5*  --   CREATININE 0.80   < > 0.83  --  0.76  --  0.80  --    < > = values in this interval not displayed.    Assessment: 44 yoM admitted with COVID-19 PNA with worsening hypoxia, s/p cannulation for ECMO. Pt was started on IV heparin prior to cannulation due to acute DVTs and possible PE, transitioned to bivalirudin with ECMO. Now s/p tPA on 1/5 and tracheostomy on 1/6.   APTT is therapeutic at 72, on bivalirudin@0 .08 mg/kg/hr. Hgb 8.8, plt 239. Fibrinogen 698, LDH 588. No s/sx of bleeding or infusion issues. Circuit stable.  Goal of Therapy:  aPTT 60-80 seconds   Plan:  Continue bivalirudin to 0.08 mg/kg/hr (using order-specific wt 72.1kg) Monitor q12h aPTT/CBC, LDH, and for s/sx of bleeding   Sherron Monday, PharmD, BCCCP Clinical Pharmacist  Phone: [phone] 04/04/2020 7:11 AM  Please check AMION for all [facility] Pharm[facility] phone numbers After 10:00 PM, call Main Pharmacy [phone]

## 2020-04-05 ENCOUNTER — Inpatient Hospital Stay (HOSPITAL_COMMUNITY): Payer: Medicaid Other

## 2020-04-05 DIAGNOSIS — R092 Respiratory arrest: Secondary | ICD-10-CM

## 2020-04-05 DIAGNOSIS — L8990 Pressure ulcer of unspecified site, unspecified stage: Secondary | ICD-10-CM | POA: Insufficient documentation

## 2020-04-05 DIAGNOSIS — Z930 Tracheostomy status: Secondary | ICD-10-CM

## 2020-04-05 LAB — POCT I-STAT 7, (LYTES, BLD GAS, ICA,H+H)
Acid-Base Excess: 11 mmol/L — ABNORMAL HIGH (ref 0.0–2.0)
Acid-Base Excess: 11 mmol/L — ABNORMAL HIGH (ref 0.0–2.0)
Acid-Base Excess: 9 mmol/L — ABNORMAL HIGH (ref 0.0–2.0)
Acid-Base Excess: 9 mmol/L — ABNORMAL HIGH (ref 0.0–2.0)
Bicarbonate: 35.4 mmol/L — ABNORMAL HIGH (ref 20.0–28.0)
Bicarbonate: 35.5 mmol/L — ABNORMAL HIGH (ref 20.0–28.0)
Bicarbonate: 36.1 mmol/L — ABNORMAL HIGH (ref 20.0–28.0)
Bicarbonate: 36.9 mmol/L — ABNORMAL HIGH (ref 20.0–28.0)
Calcium, Ion: 1.15 mmol/L (ref 1.15–1.40)
Calcium, Ion: 1.18 mmol/L (ref 1.15–1.40)
Calcium, Ion: 1.19 mmol/L (ref 1.15–1.40)
Calcium, Ion: 1.21 mmol/L (ref 1.15–1.40)
HCT: 23 % — ABNORMAL LOW (ref 39.0–52.0)
HCT: 23 % — ABNORMAL LOW (ref 39.0–52.0)
HCT: 27 % — ABNORMAL LOW (ref 39.0–52.0)
HCT: 29 % — ABNORMAL LOW (ref 39.0–52.0)
Hemoglobin: 7.8 g/dL — ABNORMAL LOW (ref 13.0–17.0)
Hemoglobin: 7.8 g/dL — ABNORMAL LOW (ref 13.0–17.0)
Hemoglobin: 9.2 g/dL — ABNORMAL LOW (ref 13.0–17.0)
Hemoglobin: 9.9 g/dL — ABNORMAL LOW (ref 13.0–17.0)
O2 Saturation: 92 %
O2 Saturation: 95 %
O2 Saturation: 96 %
O2 Saturation: 97 %
Patient temperature: 36.9
Patient temperature: 37
Patient temperature: 98.5
Patient temperature: 98.5
Potassium: 3.7 mmol/L (ref 3.5–5.1)
Potassium: 3.8 mmol/L (ref 3.5–5.1)
Potassium: 3.9 mmol/L (ref 3.5–5.1)
Potassium: 3.9 mmol/L (ref 3.5–5.1)
Sodium: 143 mmol/L (ref 135–145)
Sodium: 143 mmol/L (ref 135–145)
Sodium: 144 mmol/L (ref 135–145)
Sodium: 145 mmol/L (ref 135–145)
TCO2: 37 mmol/L — ABNORMAL HIGH (ref 22–32)
TCO2: 37 mmol/L — ABNORMAL HIGH (ref 22–32)
TCO2: 38 mmol/L — ABNORMAL HIGH (ref 22–32)
TCO2: 39 mmol/L — ABNORMAL HIGH (ref 22–32)
pCO2 arterial: 54.2 mmHg — ABNORMAL HIGH (ref 32.0–48.0)
pCO2 arterial: 55.3 mmHg — ABNORMAL HIGH (ref 32.0–48.0)
pCO2 arterial: 57.8 mmHg — ABNORMAL HIGH (ref 32.0–48.0)
pCO2 arterial: 60.6 mmHg — ABNORMAL HIGH (ref 32.0–48.0)
pH, Arterial: 7.374 (ref 7.350–7.450)
pH, Arterial: 7.396 (ref 7.350–7.450)
pH, Arterial: 7.431 (ref 7.350–7.450)
pH, Arterial: 7.431 (ref 7.350–7.450)
pO2, Arterial: 65 mmHg — ABNORMAL LOW (ref 83.0–108.0)
pO2, Arterial: 82 mmHg — ABNORMAL LOW (ref 83.0–108.0)
pO2, Arterial: 83 mmHg (ref 83.0–108.0)
pO2, Arterial: 96 mmHg (ref 83.0–108.0)

## 2020-04-05 LAB — BASIC METABOLIC PANEL
Anion gap: 10 (ref 5–15)
Anion gap: 7 (ref 5–15)
BUN: 56 mg/dL — ABNORMAL HIGH (ref 6–20)
BUN: 55 mg/dL — ABNORMAL HIGH (ref 6–20)
CO2: 33 mmol/L — ABNORMAL HIGH (ref 22–32)
CO2: 33 mmol/L — ABNORMAL HIGH (ref 22–32)
Calcium: 8.5 mg/dL — ABNORMAL LOW (ref 8.9–10.3)
Calcium: 8.6 mg/dL — ABNORMAL LOW (ref 8.9–10.3)
Chloride: 102 mmol/L (ref 98–111)
Chloride: 102 mmol/L (ref 98–111)
Creatinine, Ser: 0.87 mg/dL (ref 0.61–1.24)
Creatinine, Ser: 0.81 mg/dL (ref 0.61–1.24)
GFR, Estimated: >60 mL/min (ref >60)
GFR, Estimated: >60 mL/min (ref >60)
Glucose, Bld: 163 mg/dL — ABNORMAL HIGH (ref 70–99)
Glucose, Bld: 155 mg/dL — ABNORMAL HIGH (ref 70–99)
Potassium: 3.8 mmol/L (ref 3.5–5.1)
Potassium: 3.9 mmol/L (ref 3.5–5.1)
Sodium: 145 mmol/L (ref 135–145)
Sodium: 142 mmol/L (ref 135–145)

## 2020-04-05 LAB — CBC
HCT: 25.2 % — ABNORMAL LOW (ref 39.0–52.0)
HCT: 28.5 % — ABNORMAL LOW (ref 39.0–52.0)
Hemoglobin: 7.5 g/dL — ABNORMAL LOW (ref 13.0–17.0)
Hemoglobin: 9.1 g/dL — ABNORMAL LOW (ref 13.0–17.0)
MCH: 29.6 pg (ref 26.0–34.0)
MCH: 30.3 pg (ref 26.0–34.0)
MCHC: 29.8 g/dL — ABNORMAL LOW (ref 30.0–36.0)
MCHC: 31.9 g/dL (ref 30.0–36.0)
MCV: 99.6 fL (ref 80.0–100.0)
MCV: 95 fL (ref 80.0–100.0)
Platelets: 227 10*3/uL (ref 150–400)
Platelets: 248 10*3/uL (ref 150–400)
RBC: 2.53 MIL/uL — ABNORMAL LOW (ref 4.22–5.81)
RBC: 3 MIL/uL — ABNORMAL LOW (ref 4.22–5.81)
RDW: 18.7 % — ABNORMAL HIGH (ref 11.5–15.5)
RDW: 19.5 % — ABNORMAL HIGH (ref 11.5–15.5)
WBC: 9.8 10*3/uL (ref 4.0–10.5)
WBC: 11.9 10*3/uL — ABNORMAL HIGH (ref 4.0–10.5)
nRBC: 1.6 % — ABNORMAL HIGH (ref 0.0–0.2)
nRBC: 1.5 % — ABNORMAL HIGH (ref 0.0–0.2)

## 2020-04-05 LAB — APTT
aPTT: 77 seconds — ABNORMAL HIGH (ref 24–36)
aPTT: 75 seconds — ABNORMAL HIGH (ref 24–36)

## 2020-04-05 LAB — GLUCOSE, CAPILLARY
Glucose-Capillary: 141 mg/dL — ABNORMAL HIGH (ref 70–99)
Glucose-Capillary: 143 mg/dL — ABNORMAL HIGH (ref 70–99)
Glucose-Capillary: 152 mg/dL — ABNORMAL HIGH (ref 70–99)
Glucose-Capillary: 152 mg/dL — ABNORMAL HIGH (ref 70–99)
Glucose-Capillary: 161 mg/dL — ABNORMAL HIGH (ref 70–99)
Glucose-Capillary: 178 mg/dL — ABNORMAL HIGH (ref 70–99)

## 2020-04-05 LAB — FIBRINOGEN: Fibrinogen: 610 mg/dL — ABNORMAL HIGH (ref 210–475)

## 2020-04-05 LAB — LACTIC ACID, PLASMA
Lactic Acid, Venous: 1.3 mmol/L (ref 0.5–1.9)
Lactic Acid, Venous: 0.8 mmol/L (ref 0.5–1.9)

## 2020-04-05 LAB — TRIGLYCERIDES: Triglycerides: 124 mg/dL (ref <150)

## 2020-04-05 LAB — PROTIME-INR
INR: 1.5 — ABNORMAL HIGH (ref 0.8–1.2)
Prothrombin Time: 17.1 seconds — ABNORMAL HIGH (ref 11.4–15.2)

## 2020-04-05 LAB — VANCOMYCIN, PEAK: Vancomycin Pk: 37 ug/mL (ref 30–40)

## 2020-04-05 LAB — LACTATE DEHYDROGENASE: LDH: 491 U/L — ABNORMAL HIGH (ref 98–192)

## 2020-04-05 LAB — PREPARE RBC (CROSSMATCH)

## 2020-04-05 MED ORDER — FUROSEMIDE 10 MG/ML IJ SOLN
8.0000 mg/h | INTRAVENOUS | Status: DC
  Administered 2020-04-05: 6 mg/h via INTRAVENOUS
  Administered 2020-04-06 – 2020-04-07 (×2): 8 mg/h via INTRAVENOUS
  Filled 2020-04-05 (×4): qty 20

## 2020-04-05 MED ORDER — ETOMIDATE 2 MG/ML IV SOLN
INTRAVENOUS | Status: AC
  Administered 2020-04-05: 20 mg
  Filled 2020-04-05: qty 20

## 2020-04-05 MED ORDER — ROCURONIUM BROMIDE 10 MG/ML (PF) SYRINGE
PREFILLED_SYRINGE | INTRAVENOUS | Status: AC
  Administered 2020-04-05: 50 mg
  Filled 2020-04-05: qty 10

## 2020-04-05 MED ORDER — SODIUM CHLORIDE 0.9% IV SOLUTION: Freq: Once | INTRAVENOUS | Status: AC

## 2020-04-05 MED ORDER — FENTANYL CITRATE (PF) 100 MCG/2ML IJ SOLN
INTRAMUSCULAR | Status: AC
  Filled 2020-04-05: qty 2

## 2020-04-05 MED ORDER — BACID PO TABS
2.0000 | ORAL_TABLET | Freq: Three times a day (TID) | ORAL | Status: DC
  Administered 2020-04-05 – 2020-04-26 (×62): 2
  Filled 2020-04-05 (×66): qty 2

## 2020-04-05 MED ORDER — ACETAZOLAMIDE 250 MG PO TABS
250.0000 mg | ORAL_TABLET | Freq: Once | ORAL | Status: AC
  Administered 2020-04-05: 250 mg
  Filled 2020-04-05: qty 1

## 2020-04-05 MED ORDER — MIDAZOLAM HCL 2 MG/2ML IJ SOLN
INTRAMUSCULAR | Status: AC
  Filled 2020-04-05: qty 4

## 2020-04-05 NOTE — Progress Notes (Signed)
ANTICOAGULATION CONSULT NOTE - Follow Up Consult  Pharmacy Consult for bivalirudin Indication: ECMO and VTE  Labs: Recent Labs    04/03/20 0311 04/03/20 0312 04/04/20 0413 04/04/20 0415 04/04/20 1627 04/04/20 1629 04/05/20 0402 04/05/20 0403 04/05/20 1617 04/05/20 1651  HGB 9.1*   < > 9.0*   < > 8.1*   < > 7.5* 7.8* 9.1* 9.2*  HCT 30.1*   < > 30.6*   < > 27.3*   < > 25.2* 23.0* 28.5* 27.0*  PLT 226   < > 239  --  232  --  227  --  248  --   APTT 69*   < > 72*  --  80*  --  77*  --  75*  --   LABPROT 17.6*  --  17.1*  --   --   --  17.1*  --   --   --   INR 1.5*  --  1.5*  --   --   --  1.5*  --   --   --   CREATININE 0.83   < > 0.80  --  0.80  --  0.87  --  0.81  --    < > = values in this interval not displayed.    Assessment: 22 yoM admitted with COVID-19 PNA with worsening hypoxia, s/p cannulation for ECMO. Pt was started on IV heparin prior to cannulation due to acute DVTs and possible PE, transitioned to bivalirudin with ECMO. Now s/p tPA on 1/5 and tracheostomy on 1/6.   APTT is therapeutic at 75, on bivalirudin@0 .08 mg/kg/hr. Hgb 7.8, plt 227 Fibrinogen 610, LDH 491  Goal of Therapy:  aPTT 60-80 seconds   Plan:  Continue bivalirudin to 0.08 mg/kg/hr (using order-specific wt 72.1kg) Monitor q12h aPTT/CBC, LDH, and for s/sx of bleeding   Jeanella Cara, PharmD, [facility] Clinical Pharmacist Please see AMION for all Pharmacists' Contact Phone Numbers 04/05/2020, 5:24 PM

## 2020-04-05 NOTE — Procedures (Signed)
Extracorporeal support note  ECLS cannulation date: 12/30 Support day 26 Last circuit change: n/a  Indication: Acute hypoxic respiratory failure due to ARDS from COVID-19 pneumonia with RV dysfunction.   Configuration: Venovenous  Drainage cannula: 32 French crescent cannula via right IJ Return cannula: Same  Pump speed: 3300 RPM Pump flow: 4.3 L/min Pump used: Cardio help  Oxygenator: Cardio help O2 blender: 100% Sweep gas: 7.5L  Circuit check: clots at corners of oxygenator L>R, stable Anticoagulant: Bivalirudin Anticoagulation targets: PTT 60-80  Changes in support: Continue current level of support, maintain adequate sweep to prevent hypercarbia. Target pCO2 45-55  Anticipated goals/duration of support: Bridge to recovery.  Multidisciplinary ECMO rounds completed.  Cheri Fowler MD [facility] Pager: [phone] Mobile: [phone]

## 2020-04-05 NOTE — Progress Notes (Signed)
OT Cancellation Note  Patient Details Name: Alex [name] MRN: [id number] DOB: October 08, 1970   Cancelled Treatment:    Reason Eval/Treat Not Completed: Medical issues which prohibited therapy (On 1/23 a Bronchoscopy showed semi-occlusive polyp in the trachea. Per RN, pt sedated today while MD team is figuring out what do with polyp in regard to surgical options. Will return as schedule allows and as pt appropriate to progress activity.)  [name]  [name] MSOT, OTR/L Acute Rehab Pager: [phone] Office: [phone] 04/05/2020, 9:37 AM

## 2020-04-05 NOTE — Procedures (Signed)
Bronchoscopy Procedure Note  Alex [name]  [id number]  Aug 21, 1970  Date:04/05/20  Time:4:46 PM   Provider Performing:Alex [name]   Procedure(s):  Flexible Bronchoscopy (69629)  Indication(s) Possible tracheal lesion   Consent Risks of the procedure as well as the alternatives and risks of each were explained to the patient and/or caregiver.  Consent for the procedure was obtained and is signed in the bedside chart  Anesthesia Etomidate, rocuronium, fentanyl and propofol   Time Out Verified patient identification, verified procedure, site/side was marked, verified correct patient position, special equipment/implants available, medications/allergies/relevant history reviewed, required imaging and test results available.  Sterile Technique Usual hand hygiene, masks, gowns, and gloves were used  Procedure Description Bronchoscope advanced through tracheostomy tube and into airway.  Airways were examined down to subsegmental level with findings noted below. The tracheostomy tube ties were loosened and the balloon deflated. The trach was retracted with direct visualization of the airway. The trach was not completely removed. The tracheal wall was erythematous. There was no visible tracheal lesion identified. The previous lesion was likely granulation tissue or consolidated secretions. Nothing further needed and no intervention needed. No airway polyp identified.   Complications/Tolerance None; patient tolerated the procedure well. Chest X-ray is not needed post procedure.  EBL Minimal  Specimen(s) None   Alex Nash, DO [facility] 04/05/2020 4:50 PM

## 2020-04-05 NOTE — Progress Notes (Signed)
ECMO PROGRESS NOTE  NAME:  Alex [name], MRN:  [id number], DOB:  [birth year], LOS: 27 ADMISSION DATE:  2020-04-07, CONSULTATION DATE: 03/08/2020 REFERRING MD: Wynona Neat -LBPCCM, CHIEF COMPLAINT: Respiratory failure requiring ECMO  HPI/course in hospital  50 year old man admitted to hospital 12/28 with 1 week history of dyspnea cough nausea and vomiting.  Initially admitted to [facility] long hospital and placed on high flow nasal cannula but rapidly failed and required intubation 12/29.  Persistent hypoxic respiratory failure with PF ratio 55 in spite of 18 of PEEP FiO2 0.1 despite paralytics.  Did not improve with prone ventilation  Cannulated for VV ECMO 12/30 via right IJ crescent cannula. Iatrogenic pneumothorax from left subclavian triple-lumen placement  Past Medical History  none  Interim history/subjective:   Patient had bronchoscopy yesterday, showing tracheal granulation tissue versus polyp, he was deeply sedated to prevent coughing spell Sleep was increased from 5 L to 7.5  Objective   Blood pressure 119/74, pulse 97, temperature 98.4 F (36.9 C), temperature source Oral, resp. rate (!) 28, height 5\' 4"  (1.626 m), weight 64 kg, SpO2 100 %.    Vent Mode: PCV FiO2 (%):  [50 %] 50 % Set Rate:  [20 bmp] 20 bmp PEEP:  [10 cmH20] 10 cmH20 Plateau Pressure:  [20 cmH20-28 cmH20] 20 cmH20   Intake/Output Summary (Last 24 hours) at 04/05/2020 1524 Last data filed at 04/05/2020 1400 Gross per 24 hour  Intake 5242.56 ml  Output 1990 ml  Net 3252.56 ml   Filed Weights   04/03/20 0500 04/04/20 0500 04/05/20 0600  Weight: 63.5 kg 63.2 kg 64 kg     Examination:   Physical exam: General: Acutely ill-appearing male, s/p trach HEENT: [place]/AT, eyes anicteric.  Dry mucous membranes Neuro: Sedated, not following commands.  Eyes are closed.  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  Acute delirium Gastroparesis with some element ileus Ischemic changes L hand Poorly controlled diabetes with hyperglycemia Acute urinary retention  Continue VV ECMO support  Continue ultra lung-protective ventilation - follow for spontaneous Vt recovery Completed course of steroid Continue bivalirudin with PTT goal 60-80 On bronc patient was noted to have tracheal granulation versus tracheal polyp, ENT was consulted they defer to pulmonary Continue IV vancomycin Blood pressure is better controlled, continue clonidine Continue insulin with fingerstick goal of 140-180 Continue quetiapine Continue IV Lasix infusion, monitor intake and output, net negative goal of 1 L Continue bethanechol  Daily Goals Checklist  Pain/Anxiety/Delirium protocol (if indicated): Propofol, enteral lorazepam, valproate, hydromorphone prn,  oxycodone, clonidine, quetiapine.  VAP protocol (if indicated): bundle in place.  DVT prophylaxis: bivalirudin GI prophylaxis: pantoprazole Glucose control: Euglcyemic on combination of SSI and Basal insulin. Mobility/therapy needs: Mobilization as tolerated.  Antibiotic de-escalation: Complete 7 days of vancomycin Code Status: Full code Family Communication: Son was updated 1/22 Disposition: ICU.    Total critical care time: 48 minutes  Performed by: 2/22   Critical care time was exclusive of separately billable procedures and treating other patients.   Critical care was necessary to treat or prevent imminent or life-threatening  deterioration.   Critical care was  time spent personally by me on the following activities: development of treatment plan with patient and/or surrogate as well as nursing, discussions with consultants, evaluation of patient's response to treatment, examination of patient, obtaining history from patient or surrogate, ordering and performing treatments and interventions, ordering and review of laboratory studies, ordering and review of radiographic studies, pulse oximetry and re-evaluation of patient's condition.   Cheri Fowler MD [facility] Pager: [phone] Mobile: [phone]

## 2020-04-05 NOTE — Progress Notes (Signed)
ANTICOAGULATION CONSULT NOTE - Follow Up Consult  Pharmacy Consult for bivalirudin Indication: ECMO and VTE  Labs: Recent Labs    04/03/20 0311 04/03/20 0312 04/04/20 0413 04/04/20 0415 04/04/20 1627 04/04/20 1629 04/05/20 0017 04/05/20 0402 04/05/20 0403  HGB 9.1*   < > 9.0*   < > 8.1*   < > 7.8* 7.5* 7.8*  HCT 30.1*   < > 30.6*   < > 27.3*   < > 23.0* 25.2* 23.0*  PLT 226   < > 239  --  232  --   --  227  --   APTT 69*   < > 72*  --  80*  --   --  77*  --   LABPROT 17.6*  --  17.1*  --   --   --   --  17.1*  --   INR 1.5*  --  1.5*  --   --   --   --  1.5*  --   CREATININE 0.83   < > 0.80  --  0.80  --   --  0.87  --    < > = values in this interval not displayed.    Assessment: 83 yoM admitted with COVID-19 PNA with worsening hypoxia, s/p cannulation for ECMO. Pt was started on IV heparin prior to cannulation due to acute DVTs and possible PE, transitioned to bivalirudin with ECMO. Now s/p tPA on 1/5 and tracheostomy on 1/6.   APTT is therapeutic at 77, on bivalirudin@0 .08 mg/kg/hr. Hgb 7.8, plt 227 Fibrinogen 610, LDH 491  Goal of Therapy:  aPTT 60-80 seconds   Plan:  Continue bivalirudin to 0.08 mg/kg/hr (using order-specific wt 72.1kg) Monitor q12h aPTT/CBC, LDH, and for s/sx of bleeding   Harland German, PharmD Clinical Pharmacist **Pharmacist phone directory can now be found on [email or web address] ([email or web address]).  Listed under [facility].

## 2020-04-05 NOTE — Progress Notes (Signed)
Patient ID: Alex [name], male   DOB: October 22, 1970, 50 y.o.   MRN: [id number]     Advanced Heart Failure Rounding Note  PCP-Cardiologist: No primary care provider on file.   Subjective:    - 12/30: VV ECMO cannulation - 12/31: Left chest tube replaced - 1/2: Extubated. Echo with EF 60-65%, mildly dilated RV with mildly decreased systolic function.  - 1/4: Agitated, suspected aspiration.  Re-intubated.  - 1/5: ECMO cannula repositioned under TEE guidance. TEE showed moderately dilated/moderate-severely dysfunctional RV in setting of hypoxemia. LUE DVT found.  Patient got 1/2 dose of TPA due to initial concern for large PE.  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  - 1/6: Tracheostomy - 1/7: Echo with mild RV dilation/mild RV dysfunction.  - 1/16: Left chest tube out - 1/17: LUE arterial dopplers repeated, showed no obstruction.  - 1/20: Echo with EF 65-70%, mildly D-shaped septum, mildly dilated and mildly dysfunctional RV.  - 1/22: CTA chest: Bilateral upper lobe PEs (suspect chronic), changes c/w ARDS - 1/23: Bronchoscopy showed semi-occlusive polyp in the trachea.   Sweep at 7.5 this morning.  Sedated with propofol given tracheal polyp pending evaluation by ENT.   Vancomycin for Group F Strep and Enterococcus faecalis in trach aspirate.   He got 1 unit PRBCs this morning for hgb 7.5.   I/Os +2L, had Lasix 40 mg IV bid.       ECMO parameters: 3300 rpm Flow 4.37 L/min Pvenous -66 Delta P 24 Sweep 7.5  ABG 7.43/55/96/97% LDH  1305 => 901 => 1078 => 1,170 => 1,117 => 911 => 751 => 699 => 691 => 737 => 671 => 629 => 649 => 629 => 588 => 491 Lactate 1.1 PTT 77  Objective:   Weight Range: 64 kg Body mass index is 24.22 kg/m.   Vital Signs:   Temp:  [98.3 F (36.8 C)-98.6 F (37 C)] 98.6 F (37 C) (01/24 0725) Pulse Rate:  [94-122] 101 (01/24 0725) Resp:  [12-33] 19 (01/24 0725) BP: (73-161)/(45-92) 101/66 (01/24 0400) SpO2:  [94 %-100 %] 100  % (01/24 0725) Arterial Line BP: (73-220)/(39-82) 148/66 (01/24 0725) FiO2 (%):  [50 %] 50 % (01/24 0343) Weight:  [64 kg] 64 kg (01/24 0600) Last BM Date: 04/03/20  Weight change: Filed Weights   04/03/20 0500 04/04/20 0500 04/05/20 0600  Weight: 63.5 kg 63.2 kg 64 kg    Intake/Output:   Intake/Output Summary (Last 24 hours) at 04/05/2020 0737 Last data filed at 04/05/2020 0725 Gross per 24 hour  Intake 5258.68 ml  Output 2715 ml  Net 2543.68 ml      Physical Exam    General: NAD, sedated Neck: Tracheostomy. No JVD, no thyromegaly or thyroid nodule.  Lungs: Decreased at bases.  CV: Nondisplaced PMI.  Heart regular S1/S2, no S3/S4, no murmur.  No peripheral edema.  Abdomen: Soft, nontender, no hepatosplenomegaly, no distention.  Skin: Intact without lesions or rashes.  Neurologic: Sedated this morning. Extremities: No clubbing or cyanosis.  HEENT: Normal.    Telemetry   NSR 110s Personally reviewed   Labs    CBC Recent Labs    04/04/20 1627 04/04/20 1629 04/05/20 0402 04/05/20 0403  WBC 11.7*  --  9.8  --   HGB 8.1*   < > 7.5* 7.8*  HCT 27.3*   < > 25.2* 23.0*  MCV 99.6  --  99.6  --   PLT 232  --  227  --    < > =  values in this interval not displayed.   Basic Metabolic Panel Recent Labs    04/04/20 1627 04/04/20 1629 04/05/20 0402 04/05/20 0403  NA 145   < > 145 144  K 4.4   < > 3.8 3.7  CL 103  --  102  --   CO2 33*  --  33*  --   GLUCOSE 150*  --  163*  --   BUN 52*  --  56*  --   CREATININE 0.80  --  0.87  --   CALCIUM 8.6*  --  8.5*  --    < > = values in this interval not displayed.   Liver Function Tests No results for input(s): AST, ALT, ALKPHOS, BILITOT, PROT, ALBUMIN in the last 72 hours. No results for input(s): LIPASE, AMYLASE in the last 72 hours. Cardiac Enzymes No results for input(s): CKTOTAL, CKMB, CKMBINDEX, TROPONINI in the last 72 hours.  BNP: BNP (last 3 results) No results for input(s): BNP in the last 8760  hours.  ProBNP (last 3 results) No results for input(s): PROBNP in the last 8760 hours.   D-Dimer No results for input(s): DDIMER in the last 72 hours. Hemoglobin A1C No results for input(s): HGBA1C in the last 72 hours. Fasting Lipid Panel Recent Labs    04/05/20 0402  TRIG 124   Thyroid Function Tests No results for input(s): TSH, T4TOTAL, T3FREE, THYROIDAB in the last 72 hours.  Invalid input(s): FREET3  Other results:   Imaging    DG CHEST PORT 1 VIEW  Result Date: 04/05/2020 CLINICAL DATA:  Tracheostomy.  ECMO. EXAM: PORTABLE CHEST 1 VIEW COMPARISON:  Yesterday FINDINGS: ECMO catheter in place. Unremarkable positioning of tracheostomy, feeding tube, and left IJ line. Confluent pneumonia. No visible effusion or pneumothorax. Normal heart size. IMPRESSION: Stable hardware positioning and generalized airspace disease. Electronically Signed   By: Monte Fantasia M.D.   On: 04/05/2020 06:02     Medications:     Scheduled Medications: . bethanechol  10 mg Per Tube TID  . chlorhexidine gluconate (MEDLINE KIT)  15 mL Mouth Rinse BID  . Chlorhexidine Gluconate Cloth  6 each Topical Daily  . clonazePAM  1 mg Per Tube Daily  . clonazePAM  3 mg Per Tube QHS  . cloNIDine  0.2 mg Per Tube Q8H  . docusate  100 mg Per Tube BID  . fiber  1 packet Per Tube BID  . free water  200 mL Per Tube Q4H  . furosemide  40 mg Intravenous BID  . guaiFENesin-dextromethorphan  10 mL Per Tube BID  . insulin aspart  0-20 Units Subcutaneous Q4H  . insulin aspart  6 Units Subcutaneous Q4H  . insulin detemir  35 Units Subcutaneous BID  . lactobacillus  1 g Per Tube TID WC  . mouth rinse  15 mL Mouth Rinse 10 times per day  . melatonin  10 mg Per Tube QHS  . metoprolol tartrate  50 mg Per Tube BID  . nitroGLYCERIN  0.5 inch Topical Q6H  . oxyCODONE  15 mg Per Tube Q6H  . pantoprazole sodium  40 mg Per Tube QHS  . QUEtiapine  200 mg Per Tube QHS  . QUEtiapine  50 mg Per Tube Daily  .  sennosides  10 mL Per Tube QHS  . sodium chloride flush  10-40 mL Intracatheter Q12H  . valproic acid  250 mg Per Tube QHS    Infusions: . sodium chloride    . sodium chloride  Stopped (03/23/20 7939)  . sodium chloride 10 mL/hr at 04/05/20 0700  . sodium chloride Stopped (03/12/20 0131)  . albumin human Stopped (03/31/20 1745)  . bivalirudin (ANGIOMAX) infusion 0.5 mg/mL (Non-ACS indications) 0.08 mg/kg/hr (04/05/20 0700)  . feeding supplement (PIVOT 1.5 CAL) 1,000 mL (04/04/20 2250)  . ketamine (KETALAR) Adult IV Infusion Stopped (04/04/20 1319)  . propofol (DIPRIVAN) infusion 60 mcg/kg/min (04/05/20 0700)  . vancomycin Stopped (04/04/20 2218)    PRN Medications: Place/Maintain arterial line **AND** sodium chloride, sodium chloride, sodium chloride, acetaminophen (TYLENOL) oral liquid 160 mg/5 mL, albumin human, chlorpheniramine-HYDROcodone, guaiFENesin, haloperidol lactate, hydrALAZINE, HYDROmorphone (DILAUDID) injection, labetalol, lidocaine, lip balm, LORazepam, morphine, neomycin-bacitracin-polymyxin, ondansetron (ZOFRAN) IV, oxyCODONE, polyethylene glycol, simethicone, sodium chloride flush   Assessment/Plan   1. Acute hypoxemic respiratory failure: Due to COVID-19 PNA with bilateral infiltrates.  Refractory hypoxemia, VV-ECMO cannulation on 03/10/2020 with improvement in oxygenation.  Developed left PTX post-subclavian CVL and had left chest tube, the left lung is re-expanded and CT out.  He was extubated 1/2 but reintubated 1/4 with agitation and suspected aspiration.  Tracheostomy 1/6.  CTA chest 1/22 with suspected chronic PEs and ARDS. ECMO cannula repositioned 1/5. LDH lower today. Sweep at 7.5 with hypercarbia from significant dead space ventilation.  Now on vancomycin for Group F Strep and Enterococcus faecalis in trach aspirate.  I/Os positive on current Lasix.  CXR stable.  - Continue sweep 7.5 today with persistent hypercarbia to rest patient (uncomfortable with high work of  breathing).  - Patient has had remdesivir, tocilizumab. - Completed steroid taper.  - ECMO circuit functioning appropriately. Areas of clotting in oxygenator chronically.  - Continue bivalirudin, goal PTT 65-80. He is at 34 today.  - Lasix gtt 6 mg/hr + acetazolamide 250 mg x 1.  - Tracheal polyp (see below).  2. 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  Clot noted on TEE in RV as well.  TTE 1/2 showed normal EF 60-65%, RV improved (mildly dilated/dysfunctional). TEE on 1/5 with moderate to severe RV dysfunction but patient was hypoxemic.  Had 1/2 dose TPA on 1/5. Echo 1/20 with mildly dilated/mildly dysfunctional RV. CTA chest 1/22 with chronic-appearing PEs in upper lobes. - Bivalirudin for goal PTT 65-80.  Management as above 3. Left PTX: Left chest tube, lung is re-expanded. Tube now out, stable CXR. 4. Shock: Suspect septic/distributive.  Now resolved, off NE.  5. Anemia: Hgb 7.5. transfuse < 8. 1 unit this morning.   6. AKI: Resolved.  7. Hyperglycemia: insulin.  8. HTN: Remains elevated, following cuff pressure due to whip in the arterial line.   - On clonidine.  - Continue metoprolol 50 mg bid.  9. CHB: Episode of CHB when hypoxemic and with cough (suspect vagal).  NSR since then.    - Continue metoprolol, watch rhythm.  10. Thrombocytopenia: Resolved  11. Ileus: Resolved. TFs restarted.  - Getting Reglan  - Cor-trak repositioned to post-pyloric placement 12. Ischemic digits: LUE.  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  Repeat study 1/17 showed no obstruction.  13. ID: Group F Strep in sputum.  Also with Enterococcus faecalis in sputum.  - On vancomycin.  14. Tracheal polyp: Large, partially occlusive.  Suspect this impacts work of breathing.   - Consult ENT today for recommendations.   CRITICAL CARE Performed by: Loralie Champagne  Total critical care time: 40 minutes  Critical care time was  exclusive of separately billable procedures and treating other patients.  Critical care was necessary to treat or prevent imminent or life-threatening deterioration.  Critical care was time spent personally by me on the following activities: development of treatment plan with patient and/or surrogate as well as nursing, discussions with consultants, evaluation of patient's response to treatment, examination of patient, obtaining history from patient or surrogate, ordering and performing treatments and interventions, ordering and review of laboratory studies, ordering and review of radiographic studies, pulse oximetry and re-evaluation of patient's condition.    Length of Stay: [place], MD  04/05/2020, 7:37 AM  Advanced Heart Failure Team Pager [phone] (M-F; 7a - 4p)  Please contact [facility] for night-coverage after hours (4p -7a ) and weekends on [email or web address]

## 2020-04-05 NOTE — Progress Notes (Signed)
PT Cancellation Note  Patient Details Name: Alex [name] MRN: [id number] DOB: 1970/12/09   Cancelled Treatment:    Reason Eval/Treat Not Completed: Medical issues which prohibited therapy. On 1/23 a Bronchoscopy showed semi-occlusive polyp in the trachea. Per RN, pt sedated today while MD team is figuring out what do with polyp in regard to surgical options. PT to return as able, as appropriate to progress mobility as able.  Lewis Shock, PT, DPT Acute Rehabilitation Services Pager #: [phone] Office #: [phone]    Alex [name] 04/05/2020, 9:30 AM

## 2020-04-06 ENCOUNTER — Inpatient Hospital Stay (HOSPITAL_COMMUNITY): Payer: Medicaid Other

## 2020-04-06 LAB — POCT I-STAT 7, (LYTES, BLD GAS, ICA,H+H)
Acid-Base Excess: 11 mmol/L — ABNORMAL HIGH (ref 0.0–2.0)
Acid-Base Excess: 10 mmol/L — ABNORMAL HIGH (ref 0.0–2.0)
Acid-Base Excess: 13 mmol/L — ABNORMAL HIGH (ref 0.0–2.0)
Acid-Base Excess: 13 mmol/L — ABNORMAL HIGH (ref 0.0–2.0)
Bicarbonate: 37 mmol/L — ABNORMAL HIGH (ref 20.0–28.0)
Bicarbonate: 38 mmol/L — ABNORMAL HIGH (ref 20.0–28.0)
Bicarbonate: 39.2 mmol/L — ABNORMAL HIGH (ref 20.0–28.0)
Bicarbonate: 40.4 mmol/L — ABNORMAL HIGH (ref 20.0–28.0)
Calcium, Ion: 1.17 mmol/L (ref 1.15–1.40)
Calcium, Ion: 1.19 mmol/L (ref 1.15–1.40)
Calcium, Ion: 1.17 mmol/L (ref 1.15–1.40)
Calcium, Ion: 1.2 mmol/L (ref 1.15–1.40)
HCT: 28 % — ABNORMAL LOW (ref 39.0–52.0)
HCT: 28 % — ABNORMAL LOW (ref 39.0–52.0)
HCT: 28 % — ABNORMAL LOW (ref 39.0–52.0)
HCT: 30 % — ABNORMAL LOW (ref 39.0–52.0)
Hemoglobin: 9.5 g/dL — ABNORMAL LOW (ref 13.0–17.0)
Hemoglobin: 9.5 g/dL — ABNORMAL LOW (ref 13.0–17.0)
Hemoglobin: 9.5 g/dL — ABNORMAL LOW (ref 13.0–17.0)
Hemoglobin: 10.2 g/dL — ABNORMAL LOW (ref 13.0–17.0)
O2 Saturation: 93 %
O2 Saturation: 94 %
O2 Saturation: 95 %
O2 Saturation: 96 %
Patient temperature: 37
Patient temperature: 98.4
Patient temperature: 98.4
Patient temperature: 37
Potassium: 3.8 mmol/L (ref 3.5–5.1)
Potassium: 4.4 mmol/L (ref 3.5–5.1)
Potassium: 4.2 mmol/L (ref 3.5–5.1)
Potassium: 4.2 mmol/L (ref 3.5–5.1)
Sodium: 143 mmol/L (ref 135–145)
Sodium: 142 mmol/L (ref 135–145)
Sodium: 144 mmol/L (ref 135–145)
Sodium: 146 mmol/L — ABNORMAL HIGH (ref 135–145)
TCO2: 39 mmol/L — ABNORMAL HIGH (ref 22–32)
TCO2: 40 mmol/L — ABNORMAL HIGH (ref 22–32)
TCO2: 41 mmol/L — ABNORMAL HIGH (ref 22–32)
TCO2: 42 mmol/L — ABNORMAL HIGH (ref 22–32)
pCO2 arterial: 58.7 mmHg — ABNORMAL HIGH (ref 32.0–48.0)
pCO2 arterial: 68.9 mmHg (ref 32.0–48.0)
pCO2 arterial: 62.6 mmHg — ABNORMAL HIGH (ref 32.0–48.0)
pCO2 arterial: 70 mmHg (ref 32.0–48.0)
pH, Arterial: 7.408 (ref 7.350–7.450)
pH, Arterial: 7.348 — ABNORMAL LOW (ref 7.350–7.450)
pH, Arterial: 7.404 (ref 7.350–7.450)
pH, Arterial: 7.369 (ref 7.350–7.450)
pO2, Arterial: 69 mmHg — ABNORMAL LOW (ref 83.0–108.0)
pO2, Arterial: 77 mmHg — ABNORMAL LOW (ref 83.0–108.0)
pO2, Arterial: 77 mmHg — ABNORMAL LOW (ref 83.0–108.0)
pO2, Arterial: 87 mmHg (ref 83.0–108.0)

## 2020-04-06 LAB — CBC
HCT: 33.8 % — ABNORMAL LOW (ref 39.0–52.0)
HCT: 30.6 % — ABNORMAL LOW (ref 39.0–52.0)
Hemoglobin: 10.9 g/dL — ABNORMAL LOW (ref 13.0–17.0)
Hemoglobin: 9.1 g/dL — ABNORMAL LOW (ref 13.0–17.0)
MCH: 30.6 pg (ref 26.0–34.0)
MCH: 29 pg (ref 26.0–34.0)
MCHC: 32.2 g/dL (ref 30.0–36.0)
MCHC: 29.7 g/dL — ABNORMAL LOW (ref 30.0–36.0)
MCV: 94.9 fL (ref 80.0–100.0)
MCV: 97.5 fL (ref 80.0–100.0)
Platelets: 227 10*3/uL (ref 150–400)
Platelets: 278 10*3/uL (ref 150–400)
RBC: 3.56 MIL/uL — ABNORMAL LOW (ref 4.22–5.81)
RBC: 3.14 MIL/uL — ABNORMAL LOW (ref 4.22–5.81)
RDW: 19.2 % — ABNORMAL HIGH (ref 11.5–15.5)
RDW: 18.5 % — ABNORMAL HIGH (ref 11.5–15.5)
WBC: 9.6 10*3/uL (ref 4.0–10.5)
WBC: 15.1 10*3/uL — ABNORMAL HIGH (ref 4.0–10.5)
nRBC: 2 % — ABNORMAL HIGH (ref 0.0–0.2)
nRBC: 0.9 % — ABNORMAL HIGH (ref 0.0–0.2)

## 2020-04-06 LAB — BASIC METABOLIC PANEL
Anion gap: 13 (ref 5–15)
Anion gap: 14 (ref 5–15)
BUN: 53 mg/dL — ABNORMAL HIGH (ref 6–20)
BUN: 55 mg/dL — ABNORMAL HIGH (ref 6–20)
CO2: 33 mmol/L — ABNORMAL HIGH (ref 22–32)
CO2: 32 mmol/L (ref 22–32)
Calcium: 8.6 mg/dL — ABNORMAL LOW (ref 8.9–10.3)
Calcium: 8.6 mg/dL — ABNORMAL LOW (ref 8.9–10.3)
Chloride: 98 mmol/L (ref 98–111)
Chloride: 99 mmol/L (ref 98–111)
Creatinine, Ser: 0.77 mg/dL (ref 0.61–1.24)
Creatinine, Ser: 0.8 mg/dL (ref 0.61–1.24)
GFR, Estimated: >60 mL/min (ref >60)
GFR, Estimated: >60 mL/min (ref >60)
Glucose, Bld: 154 mg/dL — ABNORMAL HIGH (ref 70–99)
Glucose, Bld: 107 mg/dL — ABNORMAL HIGH (ref 70–99)
Potassium: 3.8 mmol/L (ref 3.5–5.1)
Potassium: 4.2 mmol/L (ref 3.5–5.1)
Sodium: 144 mmol/L (ref 135–145)
Sodium: 145 mmol/L (ref 135–145)

## 2020-04-06 LAB — PROTIME-INR
INR: 1.3 — ABNORMAL HIGH (ref 0.8–1.2)
Prothrombin Time: 16.1 seconds — ABNORMAL HIGH (ref 11.4–15.2)

## 2020-04-06 LAB — GLUCOSE, CAPILLARY
Glucose-Capillary: 155 mg/dL — ABNORMAL HIGH (ref 70–99)
Glucose-Capillary: 161 mg/dL — ABNORMAL HIGH (ref 70–99)
Glucose-Capillary: 123 mg/dL — ABNORMAL HIGH (ref 70–99)
Glucose-Capillary: 112 mg/dL — ABNORMAL HIGH (ref 70–99)
Glucose-Capillary: 134 mg/dL — ABNORMAL HIGH (ref 70–99)
Glucose-Capillary: 150 mg/dL — ABNORMAL HIGH (ref 70–99)
Glucose-Capillary: 170 mg/dL — ABNORMAL HIGH (ref 70–99)

## 2020-04-06 LAB — LACTATE DEHYDROGENASE: LDH: 524 U/L — ABNORMAL HIGH (ref 98–192)

## 2020-04-06 LAB — FIBRINOGEN: Fibrinogen: 560 mg/dL — ABNORMAL HIGH (ref 210–475)

## 2020-04-06 LAB — VANCOMYCIN, TROUGH: Vancomycin Tr: 15 ug/mL (ref 15–20)

## 2020-04-06 LAB — APTT
aPTT: 68 seconds — ABNORMAL HIGH (ref 24–36)
aPTT: 80 seconds — ABNORMAL HIGH (ref 24–36)

## 2020-04-06 MED ORDER — POTASSIUM CHLORIDE 20 MEQ PO PACK
40.0000 meq | PACK | Freq: Once | ORAL | Status: AC
  Administered 2020-04-06: 40 meq
  Filled 2020-04-06: qty 2

## 2020-04-06 MED ORDER — FENTANYL CITRATE (PF) 100 MCG/2ML IJ SOLN
50.0000 ug | INTRAMUSCULAR | Status: DC | PRN
  Administered 2020-04-07: 50 ug via INTRAVENOUS
  Filled 2020-04-06: qty 2

## 2020-04-06 MED ORDER — VANCOMYCIN HCL 1250 MG/250ML IV SOLN
1250.0000 mg | INTRAVENOUS | Status: AC
Start: 2020-04-06 — End: 2020-04-10
  Administered 2020-04-06 – 2020-04-10 (×5): 1250 mg via INTRAVENOUS
  Filled 2020-04-06 (×5): qty 250

## 2020-04-06 MED ORDER — OXYCODONE HCL 5 MG PO TABS
10.0000 mg | ORAL_TABLET | ORAL | Status: DC | PRN
  Administered 2020-04-07: 10 mg
  Filled 2020-04-06: qty 2

## 2020-04-06 MED ORDER — DEXMEDETOMIDINE HCL IN NACL 400 MCG/100ML IV SOLN
0.4000 ug/kg/h | INTRAVENOUS | Status: DC
  Administered 2020-04-06: 0.5 ug/kg/h via INTRAVENOUS
  Administered 2020-04-06 (×2): 1.2 ug/kg/h via INTRAVENOUS
  Administered 2020-04-07: 1.5 ug/kg/h via INTRAVENOUS
  Administered 2020-04-07: 1.2 ug/kg/h via INTRAVENOUS
  Administered 2020-04-07 – 2020-04-08 (×3): 1.5 ug/kg/h via INTRAVENOUS
  Filled 2020-04-06 (×8): qty 100

## 2020-04-06 MED ORDER — PROPOFOL 1000 MG/100ML IV EMUL
INTRAVENOUS | Status: AC
  Filled 2020-04-06: qty 100

## 2020-04-06 MED ORDER — QUETIAPINE FUMARATE 50 MG PO TABS
50.0000 mg | ORAL_TABLET | Freq: Two times a day (BID) | ORAL | Status: DC
  Administered 2020-04-06 – 2020-04-08 (×4): 50 mg
  Filled 2020-04-06 (×4): qty 1

## 2020-04-06 MED ORDER — FREE WATER
100.0000 mL | Freq: Four times a day (QID) | Status: DC
  Administered 2020-04-06 – 2020-04-10 (×16): 100 mL

## 2020-04-06 MED ORDER — NYSTATIN 100000 UNIT/ML MT SUSP
5.0000 mL | Freq: Four times a day (QID) | OROMUCOSAL | Status: DC
  Administered 2020-04-06 – 2020-04-16 (×39): 500000 [IU] via OROMUCOSAL
  Filled 2020-04-06 (×39): qty 5

## 2020-04-06 MED ORDER — HYDROCOD POLST-CPM POLST ER 10-8 MG/5ML PO SUER
5.0000 mL | Freq: Two times a day (BID) | ORAL | Status: DC
  Administered 2020-04-06 – 2020-05-05 (×60): 5 mL
  Filled 2020-04-06 (×61): qty 5

## 2020-04-06 MED ORDER — PROPOFOL 1000 MG/100ML IV EMUL
0.0000 ug/kg/min | INTRAVENOUS | Status: DC
  Administered 2020-04-06 – 2020-04-07 (×2): 10 ug/kg/min via INTRAVENOUS
  Filled 2020-04-06: qty 100

## 2020-04-06 NOTE — Procedures (Signed)
Extracorporeal support note  ECLS cannulation date: 12/30 Support day 27 Last circuit change: n/a  Indication: Acute hypoxic respiratory failure due to ARDS from COVID-19 pneumonia with RV dysfunction.   Configuration: Venovenous  Drainage cannula: 32 French crescent cannula via right IJ Return cannula: Same  Pump speed: 3300 RPM Pump flow: 4.3 L/min Pump used: Cardio help  Oxygenator: Cardio help O2 blender: 100% Sweep gas: 7.5L  Circuit check: clots at corners of oxygenator L>R, stable Anticoagulant: Bivalirudin Anticoagulation targets: PTT 60-80  Changes in support: Continue current level of support, maintain adequate sweep to prevent hypercarbia. Target pCO2 45-55  Anticipated goals/duration of support: Bridge to recovery.  Multidisciplinary ECMO rounds completed.   Cheri Fowler MD [facility] Pager: [phone] Mobile: [phone]

## 2020-04-06 NOTE — Progress Notes (Signed)
  Speech Language Pathology Treatment: Cognitive-Linquistic  Patient Details Name: Alex [name] MRN: [id number] DOB: 02-01-1971 Today's Date: 04/06/2020 Time: 4034-7425 SLP Time Calculation (min) (ACUTE ONLY): 10 min  Assessment / Plan / Recommendation Clinical Impression  Pt was seen briefly this afternoon to assess candidacy for inline PMV and address communication. Pt's RR at rest would fluctuate, reaching as high as 50 sometimes. Verbal/tactile cues were given to try to slow his breathing. He intermittently responded to yes/no questions via head nod. Attempted to have him squeeze my hand, which he did to command, but he could not utilize this to facilitate answering yes/no questions despite cues. Question current impact of mentation. Per RN and ECMO specialist, they have been adjusting his meds today to try to help with over sedation, but his RR has been fluctuating throughout the day. The decision was made to hold inline PMV trials today, but will continue to follow and progress as able.    HPI HPI: 50 year old man admitted to hospital 12/28 with 1 week history of dyspnea cough nausea and vomiting.  Initially admitted to [facility] long hospital and placed on high flow nasal cannula but rapidly failed and required intubation 12/29.  Persistent hypoxic respiratory failure with PF ratio 55 in spite of 18 of PEEP FiO2 0.1 despite paralytics.  Did not improve with prone ventilation. Cannulated for VV ECMO 12/30 via right IJ crescent cannula. Iatrogenic pneumothorax from left subclavian triple-lumen placement. Trach on 1/7.      SLP Plan  Continue with current plan of care       Recommendations         Patient may use Passy-Muir Speech Valve: with SLP only PMSV Supervision: Full         Oral Care Recommendations: Oral care QID Follow up Recommendations: Inpatient Rehab SLP Visit Diagnosis: Aphonia (R49.1) Plan: Continue with current plan of care       GO                Mahala Menghini., M.A. CCC-SLP Acute Rehabilitation Services Pager [phone] Office [phone]  04/06/2020, 4:39 PM

## 2020-04-06 NOTE — Progress Notes (Signed)
ECMO PROGRESS NOTE  NAME:  Alex [name], MRN:  [id number], DOB:  [birth year], LOS: 28 ADMISSION DATE:  02/27/2020, CONSULTATION DATE: 02/20/2020 REFERRING MD: Wynona Neat -LBPCCM, CHIEF COMPLAINT: Respiratory failure requiring ECMO  HPI/course in hospital  50 year old man admitted to hospital 12/28 with 1 week history of dyspnea cough nausea and vomiting.  Initially admitted to [facility] long hospital and placed on high flow nasal cannula but rapidly failed and required intubation 12/29.  Persistent hypoxic respiratory failure with PF ratio 55 in spite of 18 of PEEP FiO2 0.1 despite paralytics.  Did not improve with prone ventilation  Cannulated for VV ECMO 12/30 via right IJ crescent cannula. Iatrogenic pneumothorax from left subclavian triple-lumen placement  Past Medical History  none  Interim history/subjective:   Patient had repeat bronchoscopy yesterday, that did not show tracheal granulation tissue/ polyp,  Sweep on ECMO remained at 7.5 Getting nebulization with morphine and lidocaine for coughing spells  Objective   Blood pressure 109/78, pulse 95, temperature 98.7 F (37.1 C), temperature source Oral, resp. rate 16, height 5\' 4"  (1.626 m), weight 65 kg, SpO2 100 %.    Vent Mode: PCV FiO2 (%):  [50 %] 50 % Set Rate:  [20 bmp] 20 bmp PEEP:  [10 cmH20] 10 cmH20 Plateau Pressure:  [23 cmH20-28 cmH20] 26 cmH20   Intake/Output Summary (Last 24 hours) at 04/06/2020 1135 Last data filed at 04/06/2020 1100 Gross per 24 hour  Intake 4248.66 ml  Output 4490 ml  Net -241.34 ml   Filed Weights   04/04/20 0500 04/05/20 0600 04/06/20 0500  Weight: 63.2 kg 64 kg 65 kg     Examination:   Physical exam: General: Acutely ill-appearing male, s/p trach HEENT: [place]/AT, eyes anicteric.  Moist mucous membranes Neuro: Sedated, not following commands.  Eyes are closed.  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  Acute delirium Gastroparesis with some element ileus Ischemic changes L hand Poorly controlled diabetes with hyperglycemia Acute urinary retention  Continue VV ECMO support  Continue ultra lung-protective ventilation - follow for spontaneous Vt recovery Completed course of steroid Continue bivalirudin with PTT goal 60-80 Repeat bronchoscopy yesterday did not show granulation/polyp in trachea  Patient has been receiving morphine nebulization and topical lidocaine for frequent coughing Continue IV vancomycin to complete 7 to 10 days therapy Blood pressure is better controlled, continue clonidine Continue insulin with fingerstick goal of 140-180 We will stop propofol and switch it to Precedex with RASS goal 0/-1 Continue quetiapine He remained +2 L in last 24 hours Increase Lasix infusion to 8 mg/h, monitor intake and output, net negative goal of 1 L Continue bethanechol  Daily Goals Checklist  Pain/Anxiety/Delirium protocol (if indicated): Precedex, enteral lorazepam, valproate, hydromorphone prn,  oxycodone, clonidine, quetiapine.  VAP protocol (if indicated): bundle in place.  DVT prophylaxis: bivalirudin GI prophylaxis: pantoprazole Glucose control: Euglcyemic on combination of SSI and Basal insulin. Mobility/therapy needs: Mobilization as tolerated.  Code Status: Full code Family Communication: Son was updated 1/24 Disposition: ICU.    Total critical care time: 44 minutes  Performed by: 2/24   Critical care time was exclusive  of separately billable procedures and treating other patients.   Critical care  was necessary to treat or prevent imminent or life-threatening deterioration.   Critical care was time spent personally by me on the following activities: development of treatment plan with patient and/or surrogate as well as nursing, discussions with consultants, evaluation of patient's response to treatment, examination of patient, obtaining history from patient or surrogate, ordering and performing treatments and interventions, ordering and review of laboratory studies, ordering and review of radiographic studies, pulse oximetry and re-evaluation of patient's condition.   Cheri Fowler MD [facility] Pager: [phone] Mobile: [phone]

## 2020-04-06 NOTE — Progress Notes (Signed)
Patient ID: Alex [name], male   DOB: 1970-11-26, 50 y.o.   MRN: [id number]     Advanced Heart Failure Rounding Note  PCP-Cardiologist: No primary care provider on file.   Subjective:    - 12/30: VV ECMO cannulation - 12/31: Left chest tube replaced - 1/2: Extubated. Echo with EF 60-65%, mildly dilated RV with mildly decreased systolic function.  - 1/4: Agitated, suspected aspiration.  Re-intubated.  - 1/5: ECMO cannula repositioned under TEE guidance. TEE showed moderately dilated/moderate-severely dysfunctional RV in setting of hypoxemia. LUE DVT found.  Patient got 1/2 dose of TPA due to initial concern for large PE.  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  - 1/6: Tracheostomy - 1/7: Echo with mild RV dilation/mild RV dysfunction.  - 1/16: Left chest tube out - 1/17: LUE arterial dopplers repeated, showed no obstruction.  - 1/20: Echo with EF 65-70%, mildly D-shaped septum, mildly dilated and mildly dysfunctional RV.  - 1/22: CTA chest: Bilateral upper lobe PEs (suspect chronic), changes c/w ARDS - 1/23: Bronchoscopy showed semi-occlusive ?mass/polyp in the trachea.  - 1/24: Bronchoscopy showed resolution of 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  Sweep at 7.5 this morning.  Sedated with propofol.  Difficult to control coughing when sedation is weaned.  CXR without significant change.   Vancomycin for Group F Strep and Enterococcus faecalis in trach aspirate.   I/Os remains positive on Lasix gtt 6 mg/hr. Stable creatinine 0.77.   ECMO parameters: 3300 rpm Flow 4.32 L/min Pvenous -65 Delta P 30 Sweep 7.5  ABG 7.41/59/69/93% LDH  1,117 => 911 => 751 => 699 => 691 => 737 => 671 => 629 => 649 => 629 => 588 => 491 => 524 Lactate 1.1 PTT 68  Objective:   Weight Range: 65 kg Body mass index is 24.6 kg/m.   Vital Signs:   Temp:  [98.2 F (36.8 C)-99.4 F (37.4 C)] 98.2 F (36.8 C) (01/25 0400) Pulse Rate:  [92-115] 106 (01/25 0600) Resp:  [15-43] 21 (01/25  0600) BP: (87-163)/(53-99) 103/61 (01/25 0600) SpO2:  [91 %-100 %] 99 % (01/25 0600) Arterial Line BP: (94-216)/(36-93) 147/63 (01/25 0600) FiO2 (%):  [50 %] 50 % (01/25 0426) Weight:  [65 kg] 65 kg (01/25 0500) Last BM Date: 04/03/20  Weight change: Filed Weights   04/04/20 0500 04/05/20 0600 04/06/20 0500  Weight: 63.2 kg 64 kg 65 kg    Intake/Output:   Intake/Output Summary (Last 24 hours) at 04/06/2020 0738 Last data filed at 04/06/2020 5188 Gross per 24 hour  Intake 4621.69 ml  Output 3325 ml  Net 1296.69 ml      Physical Exam    General: NAD, sedated Neck: Trach. No JVD, no thyromegaly or thyroid nodule.  Lungs: Decreased at bases.  CV: Nondisplaced PMI.  Heart regular S1/S2, no S3/S4, no murmur.  No peripheral edema.   Abdomen: Soft, nontender, no hepatosplenomegaly, no distention.  Skin: Intact without lesions or rashes.  Neurologic: Sedated this morning.  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    Telemetry   NSR 110s Personally reviewed   Labs    CBC Recent Labs    04/05/20 1617 04/05/20 1651 04/06/20 0406 04/06/20 0408  WBC 11.9*  --  9.6  --   HGB 9.1*   < > 10.9* 9.5*  HCT 28.5*   < > 33.8* 28.0*  MCV 95.0  --  94.9  --   PLT 248  --  227  --    < > =  values in this interval not displayed.   Basic Metabolic Panel Recent Labs    04/05/20 1617 04/05/20 1651 04/06/20 0406 04/06/20 0408  NA 142   < > 144 143  K 3.9   < > 3.8 3.8  CL 102  --  98  --   CO2 33*  --  33*  --   GLUCOSE 155*  --  154*  --   BUN 55*  --  53*  --   CREATININE 0.81  --  0.77  --   CALCIUM 8.6*  --  8.6*  --    < > = values in this interval not displayed.   Liver Function Tests No results for input(s): AST, ALT, ALKPHOS, BILITOT, PROT, ALBUMIN in the last 72 hours. No results for input(s): LIPASE, AMYLASE in the last 72 hours. Cardiac Enzymes No results for input(s): CKTOTAL, CKMB, CKMBINDEX, TROPONINI in the last 72  hours.  BNP: BNP (last 3 results) No results for input(s): BNP in the last 8760 hours.  ProBNP (last 3 results) No results for input(s): PROBNP in the last 8760 hours.   D-Dimer No results for input(s): DDIMER in the last 72 hours. Hemoglobin A1C No results for input(s): HGBA1C in the last 72 hours. Fasting Lipid Panel Recent Labs    04/05/20 0402  TRIG 124   Thyroid Function Tests No results for input(s): TSH, T4TOTAL, T3FREE, THYROIDAB in the last 72 hours.  Invalid input(s): FREET3  Other results:   Imaging    DG CHEST PORT 1 VIEW  Result Date: 04/06/2020 CLINICAL DATA:  Tracheostomy.  ECMO.  Recent COVID. EXAM: PORTABLE CHEST 1 VIEW COMPARISON:  04/05/2018. FINDINGS: Tracheostomy tube, feeding tube, left central line, ECMO device in stable position. Heart size stable. Diffuse bilateral pulmonary infiltrates again noted. Small to moderate right pleural effusion appears present on today's exam. No pneumothorax. IMPRESSION: 1. Lines and tubes in stable position. ECMO device in stable position. 2. Diffuse bilateral pulmonary infiltrates again noted. No interim change. Small to moderate right pleural effusion noted on today's exam. Electronically Signed   By: Marcello Moores  Register   On: 04/06/2020 06:26     Medications:     Scheduled Medications: . bethanechol  10 mg Per Tube TID  . chlorhexidine gluconate (MEDLINE KIT)  15 mL Mouth Rinse BID  . Chlorhexidine Gluconate Cloth  6 each Topical Daily  . chlorpheniramine-HYDROcodone  5 mL Per Tube Q12H  . clonazePAM  1 mg Per Tube Daily  . clonazePAM  3 mg Per Tube QHS  . cloNIDine  0.2 mg Per Tube Q8H  . docusate  100 mg Per Tube BID  . fiber  1 packet Per Tube BID  . free water  200 mL Per Tube Q4H  . guaiFENesin-dextromethorphan  10 mL Per Tube BID  . insulin aspart  0-20 Units Subcutaneous Q4H  . insulin aspart  6 Units Subcutaneous Q4H  . insulin detemir  35 Units Subcutaneous BID  . lactobacillus acidophilus  2  tablet Per Tube TID  . mouth rinse  15 mL Mouth Rinse 10 times per day  . melatonin  10 mg Per Tube QHS  . metoprolol tartrate  50 mg Per Tube BID  . nitroGLYCERIN  0.5 inch Topical Q6H  . oxyCODONE  15 mg Per Tube Q6H  . pantoprazole sodium  40 mg Per Tube QHS  . QUEtiapine  200 mg Per Tube QHS  . QUEtiapine  50 mg Per Tube Daily  . sennosides  10 mL Per Tube QHS  . sodium chloride flush  10-40 mL Intracatheter Q12H  . valproic acid  250 mg Per Tube QHS    Infusions: . sodium chloride    . sodium chloride Stopped (03/23/20 0951)  . sodium chloride 10 mL/hr at 04/06/20 0100  . sodium chloride Stopped (03/12/20 0131)  . albumin human Stopped (03/31/20 1745)  . bivalirudin (ANGIOMAX) infusion 0.5 mg/mL (Non-ACS indications) 0.08 mg/kg/hr (04/06/20 7121)  . dexmedetomidine (PRECEDEX) IV infusion    . feeding supplement (PIVOT 1.5 CAL) 1,000 mL (04/05/20 2355)  . furosemide (LASIX) 200 mg in dextrose 5% 100 mL (58m/mL) infusion 6 mg/hr (04/06/20 09758  . ketamine (KETALAR) Adult IV Infusion Stopped (04/04/20 1319)  . propofol (DIPRIVAN) infusion 40 mcg/kg/min (04/06/20 08325  . vancomycin Stopped (04/05/20 2110)    PRN Medications: Place/Maintain arterial line **AND** sodium chloride, sodium chloride, sodium chloride, acetaminophen (TYLENOL) oral liquid 160 mg/5 mL, albumin human, chlorpheniramine-HYDROcodone, guaiFENesin, haloperidol lactate, hydrALAZINE, HYDROmorphone (DILAUDID) injection, labetalol, lidocaine, lip balm, LORazepam, morphine, neomycin-bacitracin-polymyxin, ondansetron (ZOFRAN) IV, oxyCODONE, polyethylene glycol, simethicone, sodium chloride flush   Assessment/Plan   1. Acute hypoxemic respiratory failure: Due to COVID-19 PNA with bilateral infiltrates.  Refractory hypoxemia, VV-ECMO cannulation on 02/22/2020 with improvement in oxygenation.  Developed left PTX post-subclavian CVL and had left chest tube, the left lung is re-expanded and CT out.  He was extubated 1/2 but  reintubated 1/4 with agitation and suspected aspiration.  Tracheostomy 1/6.  CTA chest 1/22 with suspected chronic PEs and ARDS. ECMO cannula repositioned 1/5. LDH lower today. Sweep at 7.5 with hypercarbia from significant dead space ventilation.  Now on vancomycin for Group F Strep and Enterococcus faecalis in trach aspirate.  I/Os positive on current Lasix.  CXR stable.  Persistent coughing when sedation weaned.  - Continue sweep 7.5 today with persistent hypercarbia to rest patient (uncomfortable with high work of breathing).  - Patient has had remdesivir, tocilizumab. - Completed steroid taper.  - ECMO circuit functioning appropriately. Areas of clotting in oxygenator chronically.  - Continue bivalirudin, goal PTT 65-80. He is at 660today.  - Increase Lasix gtt to 8 mg/hr today to aim for I/Os negative.   - Transition from Propofol to Precedex today.  - Control cough with scheduled Tussionex, lidocaine topical.  2. 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  Clot noted on TEE in RV as well.  TTE 1/2 showed normal EF 60-65%, RV improved (mildly dilated/dysfunctional). TEE on 1/5 with moderate to severe RV dysfunction but patient was hypoxemic.  Had 1/2 dose TPA on 1/5. Echo 1/20 with mildly dilated/mildly dysfunctional RV. CTA chest 1/22 with chronic-appearing PEs in upper lobes. - Bivalirudin for goal PTT 65-80.  Management as above 3. Left PTX: Left chest tube, lung is re-expanded. Tube now out, stable CXR. 4. Shock: Suspect septic/distributive.  Now resolved, off NE.  5. Anemia: Hgb 10.9. transfuse < 8.   6. AKI: Resolved.  7. Hyperglycemia: insulin.  8. HTN: Remains elevated, following cuff pressure due to whip in the arterial line.   - On clonidine.  - Continue metoprolol 50 mg bid.  9. CHB: Episode of CHB when hypoxemic and with cough (suspect vagal).  NSR since then.    - Continue metoprolol, watch rhythm.  10.  Thrombocytopenia: Resolved  11. Ileus: Resolved. TFs restarted.  - Getting Reglan  - Cor-trak repositioned to post-pyloric placement 12. Ischemic digits: LUE.  Arterial dopplers 1/5 showed >50% left brachial  stenosis.  Repeat study 1/17 showed no obstruction.  13. ID: Group F Strep in sputum.  Also with Enterococcus faecalis in sputum.  - On vancomycin.  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    CRITICAL CARE Performed by: Loralie Champagne  Total critical care time: 40 minutes  Critical care time was exclusive of separately billable procedures and treating other patients.  Critical care was necessary to treat or prevent imminent or life-threatening deterioration.  Critical care was time spent personally by me on the following activities: development of treatment plan with patient and/or surrogate as well as nursing, discussions with consultants, evaluation of patient's response to treatment, examination of patient, obtaining history from patient or surrogate, ordering and performing treatments and interventions, ordering and review of laboratory studies, ordering and review of radiographic studies, pulse oximetry and re-evaluation of patient's condition.    Length of Stay: Tyler Run, MD  04/06/2020, 7:38 AM  Advanced Heart Failure Team Pager [phone] (M-F; 7a - 4p)  Please contact [facility] for night-coverage after hours (4p -7a ) and weekends on [email or web address]

## 2020-04-06 NOTE — Progress Notes (Addendum)
Physical Therapy Treatment Patient Details Name: Alex [name] MRN: [id number] DOB: 29-Oct-1970 Today's Date: 04/06/2020    History of Present Illness 50 y.o. male  with no significant past medical history admitted on 2020-04-01 with dyspnea, cough, nausea/vomiting ~ 1 week ago with worsening symptoms of body aches and fatigue and +COVID 02/07/20 and admitted with shortness of breath. Required intubation 03/10/20. Cannulated for VV ECMO 02/22/2020. Oxygenating better with ECMO. Also evidence of RLE DVT, RV thrombus, and high suspicion of PE. Has required chest tube to L lung for collapse which needs further reposition with recurrent collapse. Extubated 03-15-19.  Reintubated 1/5 and trach placed 1/6.    PT Comments    Pt supine in chair like position.  He presents with increased RR this session and only able to tolerate PROM to UE/LE.  Pt was noted to move LUE actively to gesture for need to be suctioned.  Contiues to have coughing spells when alert.  IV sedation given during session but RR remains high.  At this time will continue to recommend CIR at d/c.      Follow Up Recommendations  CIR;Supervision/Assistance - 24 hour     Equipment Recommendations  Other (comment) (TBA)    Recommendations for Other Services       Precautions / Restrictions Precautions Precautions: Fall Precaution Comments: ECMO, Vent, rectal tube, Foley Restrictions Weight Bearing Restrictions: No    Mobility  Bed Mobility               General bed mobility comments: No bed mobility, focused on PROM this session.  Transfers                    Ambulation/Gait                 Stairs             Wheelchair Mobility    Modified Rankin (Stroke Patients Only)       Balance                                            Cognition Arousal/Alertness: Lethargic;Suspect due to medications Behavior During Therapy: Flat affect Overall Cognitive Status: Difficult  to assess                                 General Comments: He was able to gesture to nurse for suction, due to coughing given IV pain meds and more sedated during PROM.      Exercises General Exercises - Lower Extremity Ankle Circles/Pumps: PROM;Both;10 reps;Supine Short Arc Quad: PROM;Both;10 reps;Supine Hip ABduction/ADduction: PROM;Both;10 reps;Supine Shoulder Exercises Elbow Flexion: Both;10 reps;Supine;PROM Elbow Extension: PROM;Both;Supine;10 reps Wrist Flexion: PROM;Both;10 reps;Supine Wrist Extension: PROM;Both;10 reps;Supine Digit Composite Flexion: PROM;Right;10 reps;Supine Composite Extension: PROM;Right;10 reps;Supine Other Exercises Other Exercises: B UE forearm pronation and supination.    General Comments        Pertinent Vitals/Pain Pain Assessment: Faces Faces Pain Scale: Hurts little more Pain Location: generalized- more centralized to coughing Pain Descriptors / Indicators: Grimacing;Guarding;Discomfort Pain Intervention(s): Monitored during session;Repositioned    Home Living                      Prior Function            PT Goals (current goals can now  be found in the care plan section) Acute Rehab PT Goals Patient Stated Goal: unable to state Potential to Achieve Goals: Fair    Frequency    Min 3X/week      PT Plan Current plan remains appropriate    Co-evaluation              AM-PAC PT "6 Clicks" Mobility   Outcome Measure  Help needed turning from your back to your side while in a flat bed without using bedrails?: Total Help needed moving from lying on your back to sitting on the side of a flat bed without using bedrails?: Total Help needed moving to and from a bed to a chair (including a wheelchair)?: Total Help needed standing up from a chair using your arms (e.g., wheelchair or bedside chair)?: Total Help needed to walk in hospital room?: Total Help needed climbing 3-5 steps with a railing? :  Total 6 Click Score: 6    End of Session Equipment Utilized During Treatment: Other (comment) (vent with trach) Activity Tolerance: Treatment limited secondary to medical complications (Comment) (level of sedation and RR increased to 41.) Patient left: in bed;with nursing/sitter in room Nurse Communication: Other (comment) (nurse in room during session.) PT Visit Diagnosis: Muscle weakness (generalized) (M62.81);Pain Pain - part of body:  (chest)     Time: 1300-1316 PT Time Calculation (min) (ACUTE ONLY): 16 min  Charges:  $Therapeutic Exercise: 8-22 mins                     Bonney Leitz , PTA Acute Rehabilitation Services Pager [phone] Office [phone]     Alex [name] 04/06/2020, 1:45 PM

## 2020-04-06 NOTE — Progress Notes (Signed)
Pharmacy Antibiotic Note  Lamel Mccarley is a 50 y.o. male admitted on 02/19/2020 with streptococcal/enterococcal pneumonia.  Pharmacy has been consulted for vancomycin dosing  (day 4) -WBC= 9.6, afebrile, SCr= 0.77 -vancomycin peak= 37, trough= 15; calculated AUC= 620  Plan: -Change vancomycin to 1211m IV q24h (calculated AUC= 516); end date 1/29 -Will follow renal function and clinical progress   Height: _0  (162.6 cm) Weight: 65 kg (143 lb 4.8 oz) IBW/kg (Calculated) : 59.2  Temp (24hrs), Avg:98.4 F (36.9 C), Min:98.2 F (36.8 C), Max:98.7 F (37.1 C)  Recent Labs  Lab 04/03/20 1615 04/04/20 0413 04/04/20 1627 04/05/20 0402 04/05/20 1617 04/05/20 2157 04/06/20 0406 04/06/20 0748  WBC 12.3* 12.8* 11.7* 9.8 11.9*  --  9.6  --   CREATININE 0.76 0.80 0.80 0.87 0.81  --  0.77  --   LATICACIDVEN 0.8 1.1 1.8 1.3 0.8  --   --   --   VANCOTROUGH  --   --   --   --   --   --   --  15  VANCOPEAK  --   --   --   --   --  37  --   --     Estimated Creatinine Clearance: 95.6 mL/min (by C-G formula based on SCr of 0.77 mg/dL).    No Known Allergies  Antimicrobials this admission: Remdesivir 12/28-1/1 CTX/Azith starting 12/28 x 5 days> end 1/2 Vanc 1/3>>1/10, restart 1/22>> Cefepime 1/3>>1/12 Cefazolin 1/20>>1/22    Microbiology results: 1/18 BAL: >100k groupF Strep, 80k enterococcus (pan sen) 1/6 TA normal flora 12/28 BCx x 2 > propionibacterium 1/3BCx femoral stick > staph hominis,lugdun,epi 1/3UCx: neg 1/3Sputum: sent  12/28MRSA PCR: neg   Thank you for allowing pharmacy to be a part of this patient's care.  AHildred Laser PharmD Clinical Pharmacist **Pharmacist phone directory can now be found on [email or web address] ([email or web address]).  Listed under [place]

## 2020-04-06 NOTE — Progress Notes (Signed)
ANTICOAGULATION CONSULT NOTE - Follow Up Consult  Pharmacy Consult for bivalirudin Indication: ECMO and VTE  Labs: Recent Labs    04/04/20 0413 04/04/20 0415 04/05/20 0402 04/05/20 0403 04/05/20 1617 04/05/20 1651 04/05/20 2005 04/06/20 0406 04/06/20 0408  HGB 9.0*   < > 7.5*   < > 9.1*   < > 9.9* 10.9* 9.5*  HCT 30.6*   < > 25.2*   < > 28.5*   < > 29.0* 33.8* 28.0*  PLT 239   < > 227  --  248  --   --  227  --   APTT 72*   < > 77*  --  75*  --   --  68*  --   LABPROT 17.1*  --  17.1*  --   --   --   --  16.1*  --   INR 1.5*  --  1.5*  --   --   --   --  1.3*  --   CREATININE 0.80   < > 0.87  --  0.81  --   --  0.77  --    < > = values in this interval not displayed.    Assessment: 55 yoM admitted with COVID-19 PNA with worsening hypoxia, s/p cannulation for ECMO. Pt was started on IV heparin prior to cannulation due to acute DVTs and possible PE, transitioned to bivalirudin with ECMO. Now s/p tPA on 1/5 and tracheostomy on 1/6.   APTT is therapeutic at 68, on bivalirudin@0 .08 mg/kg/hr. Hgb 9.5, plt 227 Fibrinogen 560, LDH 524  Goal of Therapy:  aPTT 60-80 seconds   Plan:  Continue bivalirudin to 0.08 mg/kg/hr (using order-specific wt 72.1kg) Monitor q12h aPTT/CBC, LDH, and for s/sx of bleeding   Harland German, PharmD Clinical Pharmacist **Pharmacist phone directory can now be found on [email or web address] ([email or web address]).  Listed under [facility].

## 2020-04-06 NOTE — Progress Notes (Signed)
ANTICOAGULATION CONSULT NOTE  Pharmacy Consult for bivalirudin Indication: ECMO and VTE  Labs: Recent Labs    04/04/20 0413 04/04/20 0415 04/05/20 0402 04/05/20 0403 04/05/20 1617 04/05/20 1651 04/06/20 0406 04/06/20 0408 04/06/20 1134 04/06/20 1610 04/06/20 1806  HGB 9.0*   < > 7.5*   < > 9.1*   < > 10.9*   < > 9.5* 9.1* 9.5*  HCT 30.6*   < > 25.2*   < > 28.5*   < > 33.8*   < > 28.0* 30.6* 28.0*  PLT 239   < > 227  --  248  --  227  --   --  278  --   APTT 72*   < > 77*  --  75*  --  68*  --   --  80*  --   LABPROT 17.1*  --  17.1*  --   --   --  16.1*  --   --   --   --   INR 1.5*  --  1.5*  --   --   --  1.3*  --   --   --   --   CREATININE 0.80   < > 0.87  --  0.81  --  0.77  --   --  0.80  --    < > = values in this interval not displayed.    Assessment: 52 yoM admitted with COVID-19 PNA with worsening hypoxia, s/p cannulation for ECMO. Pt was started on IV heparin prior to cannulation due to acute DVTs and possible PE, transitioned to bivalirudin with ECMO. Now s/p tPA on 1/5 and tracheostomy on 1/6.   APTT remains therapeutic; no bleeding reported.  Goal of Therapy:  aPTT 60-80 seconds   Plan:  Continue bivalirudin to 0.08 mg/kg/hr (using order-specific wt 72.1kg) Monitor q12h aPTT/CBC, LDH, and for s/sx of bleeding    Syla Devoss D. Laney Potash, PharmD, BCPS, BCCCP 04/06/2020, 6:33 PM

## 2020-04-07 ENCOUNTER — Inpatient Hospital Stay (HOSPITAL_COMMUNITY): Payer: Medicaid Other

## 2020-04-07 LAB — POCT I-STAT 7, (LYTES, BLD GAS, ICA,H+H)
Acid-Base Excess: 11 mmol/L — ABNORMAL HIGH (ref 0.0–2.0)
Acid-Base Excess: 12 mmol/L — ABNORMAL HIGH (ref 0.0–2.0)
Acid-Base Excess: 13 mmol/L — ABNORMAL HIGH (ref 0.0–2.0)
Acid-Base Excess: 14 mmol/L — ABNORMAL HIGH (ref 0.0–2.0)
Acid-Base Excess: 15 mmol/L — ABNORMAL HIGH (ref 0.0–2.0)
Acid-Base Excess: 15 mmol/L — ABNORMAL HIGH (ref 0.0–2.0)
Acid-Base Excess: 15 mmol/L — ABNORMAL HIGH (ref 0.0–2.0)
Acid-Base Excess: 17 mmol/L — ABNORMAL HIGH (ref 0.0–2.0)
Acid-Base Excess: 17 mmol/L — ABNORMAL HIGH (ref 0.0–2.0)
Bicarbonate: 38 mmol/L — ABNORMAL HIGH (ref 20.0–28.0)
Bicarbonate: 38.3 mmol/L — ABNORMAL HIGH (ref 20.0–28.0)
Bicarbonate: 39.1 mmol/L — ABNORMAL HIGH (ref 20.0–28.0)
Bicarbonate: 39.5 mmol/L — ABNORMAL HIGH (ref 20.0–28.0)
Bicarbonate: 40.3 mmol/L — ABNORMAL HIGH (ref 20.0–28.0)
Bicarbonate: 41 mmol/L — ABNORMAL HIGH (ref 20.0–28.0)
Bicarbonate: 42.1 mmol/L — ABNORMAL HIGH (ref 20.0–28.0)
Bicarbonate: 42.5 mmol/L — ABNORMAL HIGH (ref 20.0–28.0)
Bicarbonate: 43 mmol/L — ABNORMAL HIGH (ref 20.0–28.0)
Calcium, Ion: 1.08 mmol/L — ABNORMAL LOW (ref 1.15–1.40)
Calcium, Ion: 1.14 mmol/L — ABNORMAL LOW (ref 1.15–1.40)
Calcium, Ion: 1.14 mmol/L — ABNORMAL LOW (ref 1.15–1.40)
Calcium, Ion: 1.15 mmol/L (ref 1.15–1.40)
Calcium, Ion: 1.18 mmol/L (ref 1.15–1.40)
Calcium, Ion: 1.18 mmol/L (ref 1.15–1.40)
Calcium, Ion: 1.18 mmol/L (ref 1.15–1.40)
Calcium, Ion: 1.18 mmol/L (ref 1.15–1.40)
Calcium, Ion: 1.21 mmol/L (ref 1.15–1.40)
HCT: 21 % — ABNORMAL LOW (ref 39.0–52.0)
HCT: 24 % — ABNORMAL LOW (ref 39.0–52.0)
HCT: 24 % — ABNORMAL LOW (ref 39.0–52.0)
HCT: 25 % — ABNORMAL LOW (ref 39.0–52.0)
HCT: 26 % — ABNORMAL LOW (ref 39.0–52.0)
HCT: 26 % — ABNORMAL LOW (ref 39.0–52.0)
HCT: 26 % — ABNORMAL LOW (ref 39.0–52.0)
HCT: 27 % — ABNORMAL LOW (ref 39.0–52.0)
HCT: 27 % — ABNORMAL LOW (ref 39.0–52.0)
Hemoglobin: 7.1 g/dL — ABNORMAL LOW (ref 13.0–17.0)
Hemoglobin: 8.2 g/dL — ABNORMAL LOW (ref 13.0–17.0)
Hemoglobin: 8.2 g/dL — ABNORMAL LOW (ref 13.0–17.0)
Hemoglobin: 8.5 g/dL — ABNORMAL LOW (ref 13.0–17.0)
Hemoglobin: 8.8 g/dL — ABNORMAL LOW (ref 13.0–17.0)
Hemoglobin: 8.8 g/dL — ABNORMAL LOW (ref 13.0–17.0)
Hemoglobin: 8.8 g/dL — ABNORMAL LOW (ref 13.0–17.0)
Hemoglobin: 9.2 g/dL — ABNORMAL LOW (ref 13.0–17.0)
Hemoglobin: 9.2 g/dL — ABNORMAL LOW (ref 13.0–17.0)
O2 Saturation: 100 %
O2 Saturation: 52 %
O2 Saturation: 92 %
O2 Saturation: 94 %
O2 Saturation: 94 %
O2 Saturation: 97 %
O2 Saturation: 98 %
O2 Saturation: 99 %
O2 Saturation: 99 %
Patient temperature: 37
Patient temperature: 37
Patient temperature: 37
Patient temperature: 98.5
Potassium: 3.4 mmol/L — ABNORMAL LOW (ref 3.5–5.1)
Potassium: 3.7 mmol/L (ref 3.5–5.1)
Potassium: 3.8 mmol/L (ref 3.5–5.1)
Potassium: 4 mmol/L (ref 3.5–5.1)
Potassium: 4.1 mmol/L (ref 3.5–5.1)
Potassium: 4.2 mmol/L (ref 3.5–5.1)
Potassium: 4.5 mmol/L (ref 3.5–5.1)
Potassium: 4.6 mmol/L (ref 3.5–5.1)
Potassium: 4.6 mmol/L (ref 3.5–5.1)
Sodium: 145 mmol/L (ref 135–145)
Sodium: 145 mmol/L (ref 135–145)
Sodium: 146 mmol/L — ABNORMAL HIGH (ref 135–145)
Sodium: 146 mmol/L — ABNORMAL HIGH (ref 135–145)
Sodium: 146 mmol/L — ABNORMAL HIGH (ref 135–145)
Sodium: 146 mmol/L — ABNORMAL HIGH (ref 135–145)
Sodium: 146 mmol/L — ABNORMAL HIGH (ref 135–145)
Sodium: 147 mmol/L — ABNORMAL HIGH (ref 135–145)
Sodium: 147 mmol/L — ABNORMAL HIGH (ref 135–145)
TCO2: 40 mmol/L — ABNORMAL HIGH (ref 22–32)
TCO2: 40 mmol/L — ABNORMAL HIGH (ref 22–32)
TCO2: 41 mmol/L — ABNORMAL HIGH (ref 22–32)
TCO2: 41 mmol/L — ABNORMAL HIGH (ref 22–32)
TCO2: 42 mmol/L — ABNORMAL HIGH (ref 22–32)
TCO2: 42 mmol/L — ABNORMAL HIGH (ref 22–32)
TCO2: 44 mmol/L — ABNORMAL HIGH (ref 22–32)
TCO2: 45 mmol/L — ABNORMAL HIGH (ref 22–32)
TCO2: 45 mmol/L — ABNORMAL HIGH (ref 22–32)
pCO2 arterial: 49.2 mmHg — ABNORMAL HIGH (ref 32.0–48.0)
pCO2 arterial: 51.5 mmHg — ABNORMAL HIGH (ref 32.0–48.0)
pCO2 arterial: 54.8 mmHg — ABNORMAL HIGH (ref 32.0–48.0)
pCO2 arterial: 60.1 mmHg — ABNORMAL HIGH (ref 32.0–48.0)
pCO2 arterial: 63.8 mmHg — ABNORMAL HIGH (ref 32.0–48.0)
pCO2 arterial: 63.9 mmHg — ABNORMAL HIGH (ref 32.0–48.0)
pCO2 arterial: 66.6 mmHg (ref 32.0–48.0)
pCO2 arterial: 69.8 mmHg (ref 32.0–48.0)
pCO2 arterial: 74 mmHg (ref 32.0–48.0)
pH, Arterial: 7.367 (ref 7.350–7.450)
pH, Arterial: 7.383 (ref 7.350–7.450)
pH, Arterial: 7.389 (ref 7.350–7.450)
pH, Arterial: 7.39 (ref 7.350–7.450)
pH, Arterial: 7.412 (ref 7.350–7.450)
pH, Arterial: 7.436 (ref 7.350–7.450)
pH, Arterial: 7.461 — ABNORMAL HIGH (ref 7.350–7.450)
pH, Arterial: 7.493 — ABNORMAL HIGH (ref 7.350–7.450)
pH, Arterial: 7.528 — ABNORMAL HIGH (ref 7.350–7.450)
pO2, Arterial: 106 mmHg (ref 83.0–108.0)
pO2, Arterial: 119 mmHg — ABNORMAL HIGH (ref 83.0–108.0)
pO2, Arterial: 128 mmHg — ABNORMAL HIGH (ref 83.0–108.0)
pO2, Arterial: 214 mmHg — ABNORMAL HIGH (ref 83.0–108.0)
pO2, Arterial: 30 mmHg — CL (ref 83.0–108.0)
pO2, Arterial: 64 mmHg — ABNORMAL LOW (ref 83.0–108.0)
pO2, Arterial: 70 mmHg — ABNORMAL LOW (ref 83.0–108.0)
pO2, Arterial: 74 mmHg — ABNORMAL LOW (ref 83.0–108.0)
pO2, Arterial: 92 mmHg (ref 83.0–108.0)

## 2020-04-07 LAB — BASIC METABOLIC PANEL
Anion gap: 11 (ref 5–15)
Anion gap: 10 (ref 5–15)
BUN: 55 mg/dL — ABNORMAL HIGH (ref 6–20)
BUN: 56 mg/dL — ABNORMAL HIGH (ref 6–20)
CO2: 37 mmol/L — ABNORMAL HIGH (ref 22–32)
CO2: 36 mmol/L — ABNORMAL HIGH (ref 22–32)
Calcium: 8.9 mg/dL (ref 8.9–10.3)
Calcium: 8.3 mg/dL — ABNORMAL LOW (ref 8.9–10.3)
Chloride: 100 mmol/L (ref 98–111)
Chloride: 99 mmol/L (ref 98–111)
Creatinine, Ser: 0.83 mg/dL (ref 0.61–1.24)
Creatinine, Ser: 0.79 mg/dL (ref 0.61–1.24)
GFR, Estimated: >60 mL/min (ref >60)
GFR, Estimated: >60 mL/min (ref >60)
Glucose, Bld: 150 mg/dL — ABNORMAL HIGH (ref 70–99)
Glucose, Bld: 177 mg/dL — ABNORMAL HIGH (ref 70–99)
Potassium: 3.9 mmol/L (ref 3.5–5.1)
Potassium: 3.5 mmol/L (ref 3.5–5.1)
Sodium: 148 mmol/L — ABNORMAL HIGH (ref 135–145)
Sodium: 145 mmol/L (ref 135–145)

## 2020-04-07 LAB — PROTIME-INR
INR: 1.4 — ABNORMAL HIGH (ref 0.8–1.2)
Prothrombin Time: 16.5 seconds — ABNORMAL HIGH (ref 11.4–15.2)

## 2020-04-07 LAB — APTT
aPTT: 66 seconds — ABNORMAL HIGH (ref 24–36)
aPTT: 86 seconds — ABNORMAL HIGH (ref 24–36)

## 2020-04-07 LAB — HEMOGLOBIN AND HEMATOCRIT, BLOOD
HCT: 23.7 % — ABNORMAL LOW (ref 39.0–52.0)
HCT: 26 % — ABNORMAL LOW (ref 39.0–52.0)
Hemoglobin: 7.4 g/dL — ABNORMAL LOW (ref 13.0–17.0)
Hemoglobin: 7.9 g/dL — ABNORMAL LOW (ref 13.0–17.0)

## 2020-04-07 LAB — CBC
HCT: 29.3 % — ABNORMAL LOW (ref 39.0–52.0)
HCT: 27.8 % — ABNORMAL LOW (ref 39.0–52.0)
Hemoglobin: 8.7 g/dL — ABNORMAL LOW (ref 13.0–17.0)
Hemoglobin: 8.6 g/dL — ABNORMAL LOW (ref 13.0–17.0)
MCH: 28.9 pg (ref 26.0–34.0)
MCH: 30.3 pg (ref 26.0–34.0)
MCHC: 29.7 g/dL — ABNORMAL LOW (ref 30.0–36.0)
MCHC: 30.9 g/dL (ref 30.0–36.0)
MCV: 97.3 fL (ref 80.0–100.0)
MCV: 97.9 fL (ref 80.0–100.0)
Platelets: 257 10*3/uL (ref 150–400)
Platelets: 254 10*3/uL (ref 150–400)
RBC: 3.01 MIL/uL — ABNORMAL LOW (ref 4.22–5.81)
RBC: 2.84 MIL/uL — ABNORMAL LOW (ref 4.22–5.81)
RDW: 18.1 % — ABNORMAL HIGH (ref 11.5–15.5)
RDW: 17.5 % — ABNORMAL HIGH (ref 11.5–15.5)
WBC: 10.8 10*3/uL — ABNORMAL HIGH (ref 4.0–10.5)
WBC: 13.1 10*3/uL — ABNORMAL HIGH (ref 4.0–10.5)
nRBC: 1 % — ABNORMAL HIGH (ref 0.0–0.2)
nRBC: 0.7 % — ABNORMAL HIGH (ref 0.0–0.2)

## 2020-04-07 LAB — LACTIC ACID, PLASMA
Lactic Acid, Venous: 1.1 mmol/L (ref 0.5–1.9)
Lactic Acid, Venous: 0.8 mmol/L (ref 0.5–1.9)

## 2020-04-07 LAB — TRIGLYCERIDES: Triglycerides: 83 mg/dL (ref <150)

## 2020-04-07 LAB — FIBRINOGEN: Fibrinogen: 590 mg/dL — ABNORMAL HIGH (ref 210–475)

## 2020-04-07 LAB — COOXEMETRY PANEL
Carboxyhemoglobin: 3 % — ABNORMAL HIGH (ref 0.5–1.5)
Methemoglobin: 0.8 % (ref 0.0–1.5)
O2 Saturation: 59.5 %
Total hemoglobin: 8.8 g/dL — ABNORMAL LOW (ref 12.0–16.0)

## 2020-04-07 LAB — GLUCOSE, CAPILLARY
Glucose-Capillary: 114 mg/dL — ABNORMAL HIGH (ref 70–99)
Glucose-Capillary: 139 mg/dL — ABNORMAL HIGH (ref 70–99)
Glucose-Capillary: 145 mg/dL — ABNORMAL HIGH (ref 70–99)
Glucose-Capillary: 150 mg/dL — ABNORMAL HIGH (ref 70–99)
Glucose-Capillary: 153 mg/dL — ABNORMAL HIGH (ref 70–99)
Glucose-Capillary: 178 mg/dL — ABNORMAL HIGH (ref 70–99)

## 2020-04-07 LAB — LACTATE DEHYDROGENASE: LDH: 534 U/L — ABNORMAL HIGH (ref 98–192)

## 2020-04-07 LAB — PREPARE RBC (CROSSMATCH)

## 2020-04-07 MED ORDER — JUVEN PO PACK
1.0000 | PACK | Freq: Two times a day (BID) | ORAL | Status: DC
  Administered 2020-04-08 – 2020-04-27 (×39): 1
  Filled 2020-04-07 (×39): qty 1

## 2020-04-07 MED ORDER — ACETAZOLAMIDE 250 MG PO TABS
500.0000 mg | ORAL_TABLET | Freq: Once | ORAL | Status: AC
  Administered 2020-04-07: 500 mg
  Filled 2020-04-07: qty 2

## 2020-04-07 MED ORDER — SODIUM CHLORIDE 0.9% IV SOLUTION: Freq: Once | INTRAVENOUS | Status: DC

## 2020-04-07 MED ORDER — ACETAZOLAMIDE ORAL SUSPENSION 25 MG/ML
500.0000 mg | Freq: Once | ORAL | Status: DC
  Filled 2020-04-07: qty 20

## 2020-04-07 MED ORDER — GERHARDT'S BUTT CREAM
TOPICAL_CREAM | Freq: Two times a day (BID) | CUTANEOUS | Status: DC
  Filled 2020-04-07: qty 1

## 2020-04-07 MED ORDER — SODIUM CHLORIDE 0.9% IV SOLUTION: Freq: Once | INTRAVENOUS | Status: AC

## 2020-04-07 MED ORDER — POTASSIUM CHLORIDE 20 MEQ PO PACK
40.0000 meq | PACK | Freq: Once | ORAL | Status: AC
  Administered 2020-04-07: 40 meq
  Filled 2020-04-07: qty 2

## 2020-04-07 MED ORDER — HEPARIN SODIUM (PORCINE) 1000 UNIT/ML IJ SOLN
2000.0000 [IU] | Freq: Once | INTRAMUSCULAR | Status: AC
  Administered 2020-04-07: 2000 [IU] via INTRAVENOUS
  Filled 2020-04-07: qty 2

## 2020-04-07 MED ORDER — LORAZEPAM 2 MG/ML IJ SOLN
2.0000 mg | Freq: Once | INTRAMUSCULAR | Status: AC
  Administered 2020-04-07: 2 mg via INTRAVENOUS
  Filled 2020-04-07: qty 1

## 2020-04-07 MED ORDER — PHENYLEPHRINE 40 MCG/ML (10ML) SYRINGE FOR IV PUSH (FOR BLOOD PRESSURE SUPPORT)
80.0000 ug | PREFILLED_SYRINGE | Freq: Once | INTRAVENOUS | Status: AC | PRN
  Administered 2020-04-07: 200 ug via INTRAVENOUS

## 2020-04-07 MED ORDER — NOREPINEPHRINE 4 MG/250ML-% IV SOLN
0.0000 ug/min | INTRAVENOUS | Status: DC
  Administered 2020-04-07: 0 ug/min via INTRAVENOUS
  Filled 2020-04-07 (×2): qty 250

## 2020-04-07 MED ORDER — MORPHINE SULFATE (CONCENTRATE) 10 MG/0.5ML PO SOLN
30.0000 mg | ORAL | Status: DC | PRN
  Administered 2020-04-07 (×2): 15 mg
  Administered 2020-04-08: 30 mg
  Filled 2020-04-07 (×2): qty 1.5

## 2020-04-07 NOTE — Progress Notes (Signed)
OT Cancellation Note  Patient Details Name: Alex [name] MRN: [id number] DOB: Aug 27, 1970   Cancelled Treatment:    Reason Eval/Treat Not Completed: Medical issues which prohibited therapy.  Hold for procedure, OT to check back as appropriate.    Richard D Popella 04/07/2020, 8:54 AM

## 2020-04-07 NOTE — Progress Notes (Signed)
ECMO PROGRESS NOTE  NAME:  Alex [name], MRN:  [id number], DOB:  Dec 31, 1970, LOS: 29 ADMISSION DATE:  02/25/2020, CONSULTATION DATE: 03/01/2020 REFERRING MD: Wynona Neat -LBPCCM, CHIEF COMPLAINT: Respiratory failure requiring ECMO  HPI/course in hospital  50 year old man admitted to hospital 12/28 with 1 week history of dyspnea cough nausea and vomiting.  Initially admitted to [facility] long hospital and placed on high flow nasal cannula but rapidly failed and required intubation 12/29.  Persistent hypoxic respiratory failure with PF ratio 55 in spite of 18 of PEEP FiO2 0.1 despite paralytics.  Did not improve with prone ventilation  Cannulated for VV ECMO 12/30 via right IJ crescent cannula. Iatrogenic pneumothorax from left subclavian triple-lumen placement  Past Medical History  none  Interim history/subjective:  Patient's PCO2 was increasing and he remained agitated, had to be started on propofol infusion, continue to have frequent coughing spells. Pre and post circuit ABGs were done, showed no change in PCO2, that prompted [place] to change ECMO circuit, which was done without complications  Remains on IV Lasix infusion and remains net negative in last 24 hours.  Objective   Blood pressure (!) 82/71, pulse 86, temperature 98.5 F (36.9 C), temperature source Oral, resp. rate (!) 21, height 5\' 4"  (1.626 m), weight 64.6 kg, SpO2 100 %.    Vent Mode: PCV FiO2 (%):  [50 %] 50 % Set Rate:  [20 bmp] 20 bmp PEEP:  [10 cmH20] 10 cmH20 Plateau Pressure:  [17 cmH20-23 cmH20] 23 cmH20   Intake/Output Summary (Last 24 hours) at 04/07/2020 1143 Last data filed at 04/07/2020 1100 Gross per 24 hour  Intake 3762.69 ml  Output 4675 ml  Net -912.31 ml   Filed Weights   04/05/20 0600 04/06/20 0500 04/07/20 0442  Weight: 64 kg 65 kg 64.6 kg     Examination:   Physical exam: General: Acutely ill-appearing male, s/p trach HEENT: [place]/AT, eyes anicteric.  Moist mucous membranes.  ECMO cannulation  on right side of the neck Neuro: Opens eyes with vocal stimuli, intermittently following basic commands, moving all 4 extremities spontaneously.  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  Acute delirium Gastroparesis with some element ileus Ischemic changes L hand Poorly controlled diabetes type II Acute urinary retention  Continue VV ECMO support  ECMO circuit was changed this morning as it got clogged leading to decrease gas exchange Repeat ABG on new circuit shows PCO2 downtrending Continue ultra lung-protective ventilation - follow for spontaneous Vt recovery Completed course of steroid Continue bivalirudin with PTT goal 60-80 Morphine nebulization and lidocaine topical was stopped as it was not working Patient was complaining of air hunger so was started on oral morphine solution instead of Dilaudid Continue IV vancomycin to complete 7 to 10 days therapy Blood pressure is better controlled, continue clonidine Continue insulin with fingerstick goal of 140-180 Continue Precedex, try to wean off propofol as its clogging circuit If patient needs more sedation then will add ketamine versus IV Versed pushes Continue quetiapine Continue Lasix infusion to 8 mg/h, monitor intake and output, net negative goal of 1 L Continue bethanechol  Daily Goals Checklist  Pain/Anxiety/Delirium protocol (if indicated): Precedex, enteral lorazepam, valproate, morphine prn,  oxycodone, clonidine, quetiapine.  VAP protocol (if indicated): bundle in place.  DVT  prophylaxis: bivalirudin GI prophylaxis: pantoprazole Glucose control: Euglcyemic on combination of SSI and Basal insulin. Mobility/therapy needs: Mobilization as tolerated.  Code Status: Full code Family Communication: Son was updated 1/24 Disposition: ICU.    Total critical care time: 40 minutes  Performed by: Cheri Fowler   Critical care time was exclusive of separately billable procedures and treating other patients.   Critical care was necessary to treat or prevent imminent or life-threatening deterioration.   Critical care was time spent personally by me on the following activities: development of treatment plan with patient and/or surrogate as well as nursing, discussions with consultants, evaluation of patient's response to treatment, examination of patient, obtaining history from patient or surrogate, ordering and performing treatments and interventions, ordering and review of laboratory studies, ordering and review of radiographic studies, pulse oximetry and re-evaluation of patient's condition.   Cheri Fowler MD [facility] Pager: [phone] Mobile: [phone]

## 2020-04-07 NOTE — Progress Notes (Signed)
After reviewing a pre and post filter gas it was decided by the ECMO team that the patient is requiring a ECMO circuit exchange. ECMO specialists, ECMO Coordinator, Perfusionist, PCCM MD, Pharmacist, Respiratory Therapist and Primary nurse at beside for procedure. Emergency blood, medications and equipment available. At 1100am 2,000 units of heparin was given; Time out was called. Patient off ECMO at 1103am; ECMO restarted at 1104am. During procedure patient had brief episode of bradycardia; no medical intervention needed. Patients vital signs stable post ECMO circuit change. Follow up ABG complete; see lab results.

## 2020-04-07 NOTE — Progress Notes (Addendum)
Pt's BP acutely dropped after scheduled PM meds. Oxycodone given late per scheduled dose d/t patient agitation and coughing spell with RASS of 2. Pt calmed down and then scheduled medications given with exception of total dose of klonopin, clonidine and seroquel being held d/t late administration of oxycodone. See MAR for admin details.   Precedex turned off. Lasix gtt paused while awaiting improvement in BP. Will turn back on when appropriate BP allows to.   Hgb on Cardiohelp reading 7.8- STAT H&H sent and awaiting lab result. neo stick given as emergency medication with no response. Levophed gtt hung with improvement in BP.   Addendum: Hgb resulted 7.9- Will give 1 unit RBC per ECMO goals of hgb >8

## 2020-04-07 NOTE — Progress Notes (Addendum)
Patient ID: Alex [name], male   DOB: 04-27-1970, 50 y.o.   MRN: [id number]     Advanced Heart Failure Rounding Note  PCP-Cardiologist: No primary care provider on file.   Subjective:    - 12/30: VV ECMO cannulation - 12/31: Left chest tube replaced - 1/2: Extubated. Echo with EF 60-65%, mildly dilated RV with mildly decreased systolic function.  - 1/4: Agitated, suspected aspiration.  Re-intubated.  - 1/5: ECMO cannula repositioned under TEE guidance. TEE showed moderately dilated/moderate-severely dysfunctional RV in setting of hypoxemia. LUE DVT found.  Patient got 1/2 dose of TPA due to initial concern for large PE.  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  - 1/6: Tracheostomy - 1/7: Echo with mild RV dilation/mild RV dysfunction.  - 1/16: Left chest tube out - 1/17: LUE arterial dopplers repeated, showed no obstruction.  - 1/20: Echo with EF 65-70%, mildly D-shaped septum, mildly dilated and mildly dysfunctional RV.  - 1/22: CTA chest: Bilateral upper lobe PEs (suspect chronic), changes c/w ARDS - 1/23: Bronchoscopy showed semi-occlusive ?mass/polyp in the trachea.  - 1/24: Bronchoscopy showed resolution of 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  Sweep at 8 this morning.  Sedated with propofol + Precedex.  Difficult to control coughing when sedation is weaned.  CXR without significant change, maybe more streaky infiltrate on right.   Vancomycin for Group F Strep and Enterococcus faecalis in trach aspirate.   I/Os negative on Lasix gtt 8 mg/hr with weight down. Stable creatinine 0.77.   Working with PT.   ECMO parameters: 3300 rpm Flow 4.45 L/min Pvenous -72 Delta P 29 Sweep 8 FiO2 0.5  ABG 7.44/64/70/94% LDH  1,117 => 911 => 751 => 699 => 691 => 737 => 671 => 629 => 649 => 629 => 588 => 491 => 524 => 534 Lactate 1.1 PTT 66  Objective:   Weight Range: 64.6 kg Body mass index is 24.45 kg/m.   Vital Signs:   Temp:  [97.9 F (36.6 C)-98.7 F (37.1 C)] 98.1 F  (36.7 C) (01/26 0400) Pulse Rate:  [85-114] 106 (01/26 0700) Resp:  [16-50] 21 (01/26 0700) BP: (85-171)/(53-86) 112/86 (01/26 0700) SpO2:  [93 %-100 %] 99 % (01/26 0700) Arterial Line BP: (91-171)/(45-81) 150/81 (01/26 0700) FiO2 (%):  [50 %] 50 % (01/26 0400) Weight:  [64.6 kg] 64.6 kg (01/26 0442) Last BM Date: 04/06/20  Weight change: Filed Weights   04/05/20 0600 04/06/20 0500 04/07/20 0442  Weight: 64 kg 65 kg 64.6 kg    Intake/Output:   Intake/Output Summary (Last 24 hours) at 04/07/2020 0742 Last data filed at 04/07/2020 7793 Gross per 24 hour  Intake 3751.71 ml  Output 5000 ml  Net -1248.29 ml      Physical Exam    General: NAD Neck: Trach. No JVD, no thyromegaly or thyroid nodule.  Lungs: Decreased at bases. CV: Nondisplaced PMI.  Heart regular S1/S2, no S3/S4, no murmur.  No peripheral edema.    Abdomen: Soft, nontender, no hepatosplenomegaly, no distention.  Skin: Intact without lesions or rashes.  Neurologic: Will awaken and follow commands. Extremities: No clubbing or cyanosis.  HEENT: Normal.    Telemetry   NSR 100s Personally reviewed   Labs    CBC Recent Labs    04/06/20 1610 04/06/20 1806 04/07/20 0409 04/07/20 0410  WBC 15.1*  --  10.8*  --   HGB 9.1*   < > 8.7* 8.8*  HCT 30.6*   < > 29.3* 26.0*  MCV 97.5  --  97.3  --   PLT 278  --  257  --    < > = values in this interval not displayed.   Basic Metabolic Panel Recent Labs    04/06/20 1610 04/06/20 1806 04/07/20 0409 04/07/20 0410  NA 145   < > 148* 146*  K 4.2   < > 3.9 3.8  CL 99  --  100  --   CO2 32  --  37*  --   GLUCOSE 107*  --  150*  --   BUN 55*  --  55*  --   CREATININE 0.80  --  0.83  --   CALCIUM 8.6*  --  8.9  --    < > = values in this interval not displayed.   Liver Function Tests No results for input(s): AST, ALT, ALKPHOS, BILITOT, PROT, ALBUMIN in the last 72 hours. No results for input(s): LIPASE, AMYLASE in the last 72 hours. Cardiac Enzymes No  results for input(s): CKTOTAL, CKMB, CKMBINDEX, TROPONINI in the last 72 hours.  BNP: BNP (last 3 results) No results for input(s): BNP in the last 8760 hours.  ProBNP (last 3 results) No results for input(s): PROBNP in the last 8760 hours.   D-Dimer No results for input(s): DDIMER in the last 72 hours. Hemoglobin A1C No results for input(s): HGBA1C in the last 72 hours. Fasting Lipid Panel Recent Labs    04/07/20 0409  TRIG 83   Thyroid Function Tests No results for input(s): TSH, T4TOTAL, T3FREE, THYROIDAB in the last 72 hours.  Invalid input(s): FREET3  Other results:   Imaging    DG CHEST PORT 1 VIEW  Result Date: 04/07/2020 CLINICAL DATA:  Tracheostomy.  ECMO.  History of COVID. EXAM: PORTABLE CHEST 1 VIEW COMPARISON:  04/06/2020. FINDINGS: Tracheostomy tube, feeding tube, left central line, ECMO device in stable position. Heart size stable. Diffuse dense bilateral pulmonary infiltrates are again noted without interim change. Small moderate right pleural effusion again noted. No pneumothorax. Chest is unchanged from prior exam. IMPRESSION: 1. Tracheostomy tube, feeding tube, left central line, ECMO device in stable position. 2. Diffuse dense bilateral pulmonary infiltrates and small moderate right pleural effusion again noted. Chest is unchanged from prior exam. Electronically Signed   By: Marcello Moores  Register   On: 04/07/2020 06:26     Medications:     Scheduled Medications: . bethanechol  10 mg Per Tube TID  . chlorhexidine gluconate (MEDLINE KIT)  15 mL Mouth Rinse BID  . Chlorhexidine Gluconate Cloth  6 each Topical Daily  . chlorpheniramine-HYDROcodone  5 mL Per Tube Q12H  . clonazePAM  1 mg Per Tube Daily  . clonazePAM  3 mg Per Tube QHS  . cloNIDine  0.2 mg Per Tube Q8H  . docusate  100 mg Per Tube BID  . fiber  1 packet Per Tube BID  . free water  100 mL Per Tube Q6H  . guaiFENesin-dextromethorphan  10 mL Per Tube BID  . insulin aspart  0-20 Units  Subcutaneous Q4H  . insulin aspart  6 Units Subcutaneous Q4H  . insulin detemir  35 Units Subcutaneous BID  . lactobacillus acidophilus  2 tablet Per Tube TID  . mouth rinse  15 mL Mouth Rinse 10 times per day  . melatonin  10 mg Per Tube QHS  . metoprolol tartrate  50 mg Per Tube BID  . nitroGLYCERIN  0.5 inch Topical Q6H  . nystatin  5 mL Mouth/Throat QID  . oxyCODONE  15  mg Per Tube Q6H  . pantoprazole sodium  40 mg Per Tube QHS  . QUEtiapine  200 mg Per Tube QHS  . QUEtiapine  50 mg Per Tube BID  . sennosides  10 mL Per Tube QHS  . sodium chloride flush  10-40 mL Intracatheter Q12H  . valproic acid  250 mg Per Tube QHS    Infusions: . sodium chloride    . sodium chloride Stopped (03/23/20 0951)  . sodium chloride 10 mL/hr at 04/07/20 6789  . sodium chloride Stopped (03/12/20 0131)  . albumin human Stopped (03/31/20 1745)  . bivalirudin (ANGIOMAX) infusion 0.5 mg/mL (Non-ACS indications) 0.08 mg/kg/hr (04/07/20 3810)  . dexmedetomidine (PRECEDEX) IV infusion 1.2 mcg/kg/hr (04/07/20 0645)  . feeding supplement (PIVOT 1.5 CAL) 1,000 mL (04/05/20 2355)  . furosemide (LASIX) 200 mg in dextrose 5% 100 mL (84m/mL) infusion 8 mg/hr (04/07/20 0621)  . ketamine (KETALAR) Adult IV Infusion Stopped (04/04/20 1319)  . propofol (DIPRIVAN) infusion 10 mcg/kg/min (04/07/20 01751  . vancomycin Stopped (04/06/20 1304)    PRN Medications: Place/Maintain arterial line **AND** sodium chloride, sodium chloride, sodium chloride, acetaminophen (TYLENOL) oral liquid 160 mg/5 mL, albumin human, fentaNYL (SUBLIMAZE) injection, guaiFENesin, haloperidol lactate, hydrALAZINE, HYDROmorphone (DILAUDID) injection, labetalol, lip balm, LORazepam, morphine, neomycin-bacitracin-polymyxin, ondansetron (ZOFRAN) IV, oxyCODONE, polyethylene glycol, simethicone, sodium chloride flush   Assessment/Plan   1. Acute hypoxemic respiratory failure: Due to COVID-19 PNA with bilateral infiltrates.  Refractory hypoxemia,  VV-ECMO cannulation on 03/10/2020 with improvement in oxygenation.  Developed left PTX post-subclavian CVL and had left chest tube, the left lung is re-expanded and CT out.  He was extubated 1/2 but reintubated 1/4 with agitation and suspected aspiration.  Tracheostomy 1/6.  CTA chest 1/22 with suspected chronic PEs and ARDS. ECMO cannula repositioned 1/5. LDH stable today. Sweep at 8 with hypercarbia from significant dead space ventilation.  Now on vancomycin for Group F Strep and Enterococcus faecalis in trach aspirate.  I/Os negative on current Lasix.  CXR stable to mildly worse.  Persistent coughing when sedation weaned.  - Continue sweep 8 today with persistent hypercarbia (uncomfortable with high work of breathing).  - Patient has had remdesivir, tocilizumab. - Completed steroid taper.  - ECMO circuit seems to be functioning appropriately. Areas of clotting in oxygenator chronically. Will check pre/post gas. - Continue bivalirudin, goal PTT 65-80. He is at 667today.  - Continue Lasix gtt 8 mg/hr today to aim for I/Os negative.   - Aim to get him off Propofol today.  - Control cough with scheduled Tussionex, lidocaine topical.  - Work with PT 2. 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  Clot noted on TEE in RV as well.  TTE 1/2 showed normal EF 60-65%, RV improved (mildly dilated/dysfunctional). TEE on 1/5 with moderate to severe RV dysfunction but patient was hypoxemic.  Had 1/2 dose TPA on 1/5. Echo 1/20 with mildly dilated/mildly dysfunctional RV. CTA chest 1/22 with chronic-appearing PEs in upper lobes. - Bivalirudin for goal PTT 65-80.  Management as above 3. Left PTX: Left chest tube, lung is re-expanded. Tube now out, stable CXR. 4. Shock: Suspect septic/distributive.  Now resolved, off NE.  5. Anemia: Hgb 8.7. transfuse < 8.   6. AKI: Resolved.  7. Hyperglycemia: insulin.  8. HTN: Remains elevated, following cuff pressure  due to whip in the arterial line.   - On clonidine.  - Continue metoprolol 50 mg bid.  9. CHB: Episode of CHB when hypoxemic and  with cough (suspect vagal).  NSR since then.    - Continue metoprolol, watch rhythm.  10. Thrombocytopenia: Resolved  11. Ileus: Resolved. TFs restarted.  - Getting Reglan  - Cor-trak repositioned to post-pyloric placement 12. Ischemic digits: LUE.  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  Repeat study 1/17 showed no obstruction.  13. ID: Group F Strep in sputum.  Also with Enterococcus faecalis in sputum.  - On vancomycin.  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    CRITICAL CARE Performed by: Loralie Champagne  Total critical care time: 40 minutes  Critical care time was exclusive of separately billable procedures and treating other patients.  Critical care was necessary to treat or prevent imminent or life-threatening deterioration.  Critical care was time spent personally by me on the following activities: development of treatment plan with patient and/or surrogate as well as nursing, discussions with consultants, evaluation of patient's response to treatment, examination of patient, obtaining history from patient or surrogate, ordering and performing treatments and interventions, ordering and review of laboratory studies, ordering and review of radiographic studies, pulse oximetry and re-evaluation of patient's condition.    Length of Stay: [place], MD  04/07/2020, 7:42 AM  Advanced Heart Failure Team Pager [phone] (M-F; 7a - 4p)  Please contact [facility] for night-coverage after hours (4p -7a ) and weekends on [email or web address]  Pre and post ABG show that we are not effectively clearing CO2.  Discussed with ECMO coordinator, will change out circuit today.   Loralie Champagne 04/07/2020 8:43 AM

## 2020-04-07 NOTE — Consult Note (Signed)
WOC Nurse Consult Note: Patient receiving care in [facility] [phone]. Reason for Consult: re-evaluate left hand Wound type: ruptured bulla Per my phone conversation with Aline August, the large bulla on the dorsum of the left hand has ruptured.  The RN tried to push the skin flap over the open pink wound and covered it with xeroform and gauze.  This is exactly what I would have recommended.  The POC for the left hand in on record. No further topical recommendations needed.  Helmut Muster, RN, MSN, CWOCN, CNS-BC, pager [phone]

## 2020-04-07 NOTE — Progress Notes (Signed)
ANTICOAGULATION CONSULT NOTE  Pharmacy Consult for bivalirudin Indication: ECMO and VTE  Labs: Recent Labs    04/05/20 0402 04/05/20 0403 04/06/20 0406 04/06/20 0408 04/06/20 1610 04/06/20 1806 04/07/20 0009 04/07/20 0409 04/07/20 0410  HGB 7.5*   < > 10.9*   < > 9.1*   < > 8.8* 8.7* 8.8*  HCT 25.2*   < > 33.8*   < > 30.6*   < > 26.0* 29.3* 26.0*  PLT 227   < > 227  --  278  --   --  257  --   APTT 77*   < > 68*  --  80*  --   --  66*  --   LABPROT 17.1*  --  16.1*  --   --   --   --  16.5*  --   INR 1.5*  --  1.3*  --   --   --   --  1.4*  --   CREATININE 0.87   < > 0.77  --  0.80  --   --  0.83  --    < > = values in this interval not displayed.    Assessment: 89 yoM admitted with COVID-19 PNA with worsening hypoxia, s/p cannulation for ECMO. Pt was started on IV heparin prior to cannulation due to acute DVTs and possible PE, transitioned to bivalirudin with ECMO. Now s/p tPA on 1/5 and tracheostomy on 1/6.   APTT remains therapeutic, LDH= 534, fibrinogen= 590, hg= 8.8  Goal of Therapy:  aPTT 60-80 seconds   Plan:  Continue bivalirudin to 0.08 mg/kg/hr (using order-specific wt 72.1kg) Monitor q12h aPTT/CBC, LDH, and for s/sx of bleeding    Harland German, PharmD Clinical Pharmacist **Pharmacist phone directory can now be found on [email or web address] ([email or web address]).  Listed under [facility].

## 2020-04-07 NOTE — Procedures (Signed)
Extracorporeal support note  ECLS cannulation date: 12/30 Support day 27 Last circuit change: 04/07/2020  Indication: Acute hypoxic respiratory failure due to ARDS from COVID-19 pneumonia with RV dysfunction.   Configuration: Venovenous  Drainage cannula: 32 French crescent cannula via right IJ Return cannula: Same  Pump speed: 3300 RPM Pump flow: 4.3 L/min Pump used: Cardio help  Oxygenator: Cardio help O2 blender: 100% Sweep gas: 8 L  Circuit check: clots at corners of oxygenator L>R, stable.  Clean circuit was replaced Anticoagulant: Bivalirudin Anticoagulation targets: PTT 60-80  Changes in support: Continue current level of support, maintain adequate sweep to prevent hypercarbia. Target pCO2 45-55  Anticipated goals/duration of support: Bridge to recovery.  Multidisciplinary ECMO rounds completed.   Cheri Fowler MD  Pulmonary Critical Care Pager: [phone] Mobile: [phone]

## 2020-04-07 NOTE — Progress Notes (Signed)
ANTICOAGULATION CONSULT NOTE  Pharmacy Consult for bivalirudin Indication: ECMO and VTE  Labs: Recent Labs    04/05/20 0402 04/05/20 0403 04/06/20 0406 04/06/20 0408 04/06/20 1610 04/06/20 1806 04/07/20 0409 04/07/20 0410 04/07/20 1458 04/07/20 1548 04/07/20 1639  HGB 7.5*   < > 10.9*   < > 9.1*   < > 8.7*   < > 8.2* 8.6* 8.5*  HCT 25.2*   < > 33.8*   < > 30.6*   < > 29.3*   < > 24.0* 27.8* 25.0*  PLT 227   < > 227  --  278  --  257  --   --  254  --   APTT 77*   < > 68*  --  80*  --  66*  --   --  86*  --   LABPROT 17.1*  --  16.1*  --   --   --  16.5*  --   --   --   --   INR 1.5*  --  1.3*  --   --   --  1.4*  --   --   --   --   CREATININE 0.87   < > 0.77  --  0.80  --  0.83  --   --  0.79  --    < > = values in this interval not displayed.    Assessment: 30 yoM admitted with COVID-19 PNA with worsening hypoxia, s/p cannulation for ECMO. Pt was started on IV heparin prior to cannulation due to acute DVTs and possible PE, transitioned to bivalirudin with ECMO. Now s/p tPA on 1/5 and tracheostomy on 1/6.   APTT slightly supra-therapeutic tonight, possibly from the heparin bolus for circuit change today.  He has been stable on the current rate for multiple days.  No bleeding per RN.  Goal of Therapy:  aPTT 60-80 seconds   Plan:  Continue bivalirudin to 0.08 mg/kg/hr (using order-specific wt 72.1kg) F/U AM labs and adjust bivalirudin if aPTT remains high  Omari Koslosky D. Laney Potash, PharmD, BCPS, BCCCP 04/07/2020, 6:50 PM

## 2020-04-07 NOTE — Progress Notes (Signed)
Nutrition Follow-up  DOCUMENTATION CODES:   Not applicable  INTERVENTION:   Tube Feeding via Cortrak:  Pivot 1.5 to 70 ml/hr Provides 2520 kcals, 158 g of protein and 1275 mL of free water  Add Juven BID to promote wound healing, each packet provides 80 calories, 8 grams of carbohydrate, 2.5  grams of protein (collagen), 7 grams of L-arginine and 7 grams of L-glutamine; supplement contains CaHMB, Vitamins C, E, B12 and Zinc    NUTRITION DIAGNOSIS:   Increased nutrient needs related to acute illness,catabolic illness (COVID-19 infection) as evidenced by estimated needs.  Being addressed via TF regimen  GOAL:   Patient will meet greater than or equal to 90% of their needs  MONITOR:   Vent status,TF tolerance,Labs,Weight trends  REASON FOR ASSESSMENT:   LOS Enteral/tube feeding initiation and management  ASSESSMENT:   50 y.o. male with no significant medical history. He presented to the ED with dyspnea, cough, and N/V. He reported that his symptoms began with cough 1 week ago. He tried OTC meds but nothing helped. Symptoms worsened to include body aches, fatigue, and N/V 3 days later. He went to his PCP 12/26 and got tested for COVID; he was positive.  12/26 COVID+  12/28 Admitted to [facility] 12/29 Intubated 12/30 Transferred to [facility], VV ECMO cannulation, L PTX with Chest tube insertion 12/31 Cortrak placed, Post-pyloric  1/02 Extubated to HFNC/BiPap as needed, ECHO with EF 60-65% 1/06 TEE for ECMO cannula position, Re-Intubated, Rhinorockets placed for epistaxis, Cortrak malpositioned-repositioned and now gastric per xray 1/07 Trach placed 1/14 Cortrak advanced to post pyloric position 1/16 L. Chest tube removed 1/22 CT chest: bilateral UL PE 1/23 Bronch showed semi occlusive ?mass/polyp in trachea 1/24 Bronch showed resolution of 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 1/26 ECMO Circuit exchanged  Remains on VV ECMO, Sweep at 8; ECMO circuit exchange performed today Remains on vent support via  trach Working with PT  Tolerating Pivot 1.5 at 70 ml/hr via Cortrak tube. Free water flush of 100 mL q 6 hours  Large bulla on L hand ruptured; evaluated by WOC RN  Noted newly charted Stage 2 to anus from rectal tube, stage 2 coccyx, DTI buttocks  Current wt 64.6 kg; lowest wt 62.6 kg. Admit wt 72 kg. Net negative   Labs: sodium 146 (H)-free water flush per MD Meds: colace, lasix gtt, nutrisource fiber, ss novolog, novolog q 4 hours, levemir, lactobacillus acidophilus, miralax  Diet Order:   Diet Order    None      EDUCATION NEEDS:   Not appropriate for education at this time  Skin:  Skin Assessment: Skin Integrity Issues: Skin Integrity Issues:: Stage II,Other (Comment) DTI: buttocks, forhead Stage II: anus, coccyx Other: ischemic changes to LUE, large bulla on L hand that ruptured  Last BM:  1/26 rectal tube  Height:   Ht Readings from Last 1 Encounters:  02/28/2020 5\' 4"  (1.626 m)    Weight:   Wt Readings from Last 1 Encounters:  04/07/20 64.6 kg    Ideal Body Weight:  59.1 kg  BMI:  Body mass index is 24.45 kg/m.  Estimated Nutritional Needs:   Kcal:  2160-2520 kcals  Protein:  130-150 g  Fluid:  >/= 2 L   05-30-1992 MS, RDN, LDN, CNSC Registered Dietitian III Clinical Nutrition RD Pager and On-Call Pager Number Located in [place]

## 2020-04-08 ENCOUNTER — Inpatient Hospital Stay (HOSPITAL_COMMUNITY): Payer: Medicaid Other

## 2020-04-08 LAB — POCT I-STAT 7, (LYTES, BLD GAS, ICA,H+H)
Acid-Base Excess: 10 mmol/L — ABNORMAL HIGH (ref 0.0–2.0)
Acid-Base Excess: 10 mmol/L — ABNORMAL HIGH (ref 0.0–2.0)
Acid-Base Excess: 10 mmol/L — ABNORMAL HIGH (ref 0.0–2.0)
Acid-Base Excess: 10 mmol/L — ABNORMAL HIGH (ref 0.0–2.0)
Acid-Base Excess: 11 mmol/L — ABNORMAL HIGH (ref 0.0–2.0)
Acid-Base Excess: 11 mmol/L — ABNORMAL HIGH (ref 0.0–2.0)
Acid-Base Excess: 13 mmol/L — ABNORMAL HIGH (ref 0.0–2.0)
Acid-Base Excess: 14 mmol/L — ABNORMAL HIGH (ref 0.0–2.0)
Acid-Base Excess: 6 mmol/L — ABNORMAL HIGH (ref 0.0–2.0)
Acid-Base Excess: 8 mmol/L — ABNORMAL HIGH (ref 0.0–2.0)
Acid-Base Excess: 9 mmol/L — ABNORMAL HIGH (ref 0.0–2.0)
Acid-Base Excess: 9 mmol/L — ABNORMAL HIGH (ref 0.0–2.0)
Bicarbonate: 34.2 mmol/L — ABNORMAL HIGH (ref 20.0–28.0)
Bicarbonate: 35 mmol/L — ABNORMAL HIGH (ref 20.0–28.0)
Bicarbonate: 35.1 mmol/L — ABNORMAL HIGH (ref 20.0–28.0)
Bicarbonate: 35.5 mmol/L — ABNORMAL HIGH (ref 20.0–28.0)
Bicarbonate: 36 mmol/L — ABNORMAL HIGH (ref 20.0–28.0)
Bicarbonate: 36.5 mmol/L — ABNORMAL HIGH (ref 20.0–28.0)
Bicarbonate: 36.8 mmol/L — ABNORMAL HIGH (ref 20.0–28.0)
Bicarbonate: 37.6 mmol/L — ABNORMAL HIGH (ref 20.0–28.0)
Bicarbonate: 37.9 mmol/L — ABNORMAL HIGH (ref 20.0–28.0)
Bicarbonate: 38.2 mmol/L — ABNORMAL HIGH (ref 20.0–28.0)
Bicarbonate: 38.7 mmol/L — ABNORMAL HIGH (ref 20.0–28.0)
Bicarbonate: 39.2 mmol/L — ABNORMAL HIGH (ref 20.0–28.0)
Calcium, Ion: 1.17 mmol/L (ref 1.15–1.40)
Calcium, Ion: 1.17 mmol/L (ref 1.15–1.40)
Calcium, Ion: 1.19 mmol/L (ref 1.15–1.40)
Calcium, Ion: 1.2 mmol/L (ref 1.15–1.40)
Calcium, Ion: 1.2 mmol/L (ref 1.15–1.40)
Calcium, Ion: 1.2 mmol/L (ref 1.15–1.40)
Calcium, Ion: 1.21 mmol/L (ref 1.15–1.40)
Calcium, Ion: 1.23 mmol/L (ref 1.15–1.40)
Calcium, Ion: 1.25 mmol/L (ref 1.15–1.40)
Calcium, Ion: 1.25 mmol/L (ref 1.15–1.40)
Calcium, Ion: 1.25 mmol/L (ref 1.15–1.40)
Calcium, Ion: 1.26 mmol/L (ref 1.15–1.40)
HCT: 26 % — ABNORMAL LOW (ref 39.0–52.0)
HCT: 26 % — ABNORMAL LOW (ref 39.0–52.0)
HCT: 27 % — ABNORMAL LOW (ref 39.0–52.0)
HCT: 27 % — ABNORMAL LOW (ref 39.0–52.0)
HCT: 28 % — ABNORMAL LOW (ref 39.0–52.0)
HCT: 28 % — ABNORMAL LOW (ref 39.0–52.0)
HCT: 29 % — ABNORMAL LOW (ref 39.0–52.0)
HCT: 29 % — ABNORMAL LOW (ref 39.0–52.0)
HCT: 29 % — ABNORMAL LOW (ref 39.0–52.0)
HCT: 30 % — ABNORMAL LOW (ref 39.0–52.0)
HCT: 31 % — ABNORMAL LOW (ref 39.0–52.0)
HCT: 31 % — ABNORMAL LOW (ref 39.0–52.0)
Hemoglobin: 10.2 g/dL — ABNORMAL LOW (ref 13.0–17.0)
Hemoglobin: 10.5 g/dL — ABNORMAL LOW (ref 13.0–17.0)
Hemoglobin: 10.5 g/dL — ABNORMAL LOW (ref 13.0–17.0)
Hemoglobin: 8.8 g/dL — ABNORMAL LOW (ref 13.0–17.0)
Hemoglobin: 8.8 g/dL — ABNORMAL LOW (ref 13.0–17.0)
Hemoglobin: 9.2 g/dL — ABNORMAL LOW (ref 13.0–17.0)
Hemoglobin: 9.2 g/dL — ABNORMAL LOW (ref 13.0–17.0)
Hemoglobin: 9.5 g/dL — ABNORMAL LOW (ref 13.0–17.0)
Hemoglobin: 9.5 g/dL — ABNORMAL LOW (ref 13.0–17.0)
Hemoglobin: 9.9 g/dL — ABNORMAL LOW (ref 13.0–17.0)
Hemoglobin: 9.9 g/dL — ABNORMAL LOW (ref 13.0–17.0)
Hemoglobin: 9.9 g/dL — ABNORMAL LOW (ref 13.0–17.0)
O2 Saturation: 100 %
O2 Saturation: 100 %
O2 Saturation: 53 %
O2 Saturation: 68 %
O2 Saturation: 87 %
O2 Saturation: 88 %
O2 Saturation: 89 %
O2 Saturation: 91 %
O2 Saturation: 94 %
O2 Saturation: 95 %
O2 Saturation: 97 %
O2 Saturation: 98 %
Patient temperature: 36.8
Patient temperature: 36.8
Patient temperature: 37
Patient temperature: 37
Patient temperature: 37
Patient temperature: 37
Patient temperature: 37
Patient temperature: 37
Patient temperature: 37
Patient temperature: 37
Patient temperature: 37.2
Patient temperature: 37.4
Potassium: 3.3 mmol/L — ABNORMAL LOW (ref 3.5–5.1)
Potassium: 3.5 mmol/L (ref 3.5–5.1)
Potassium: 3.5 mmol/L (ref 3.5–5.1)
Potassium: 3.6 mmol/L (ref 3.5–5.1)
Potassium: 3.6 mmol/L (ref 3.5–5.1)
Potassium: 3.7 mmol/L (ref 3.5–5.1)
Potassium: 3.7 mmol/L (ref 3.5–5.1)
Potassium: 3.8 mmol/L (ref 3.5–5.1)
Potassium: 3.8 mmol/L (ref 3.5–5.1)
Potassium: 3.9 mmol/L (ref 3.5–5.1)
Potassium: 4 mmol/L (ref 3.5–5.1)
Potassium: 4.1 mmol/L (ref 3.5–5.1)
Sodium: 146 mmol/L — ABNORMAL HIGH (ref 135–145)
Sodium: 147 mmol/L — ABNORMAL HIGH (ref 135–145)
Sodium: 147 mmol/L — ABNORMAL HIGH (ref 135–145)
Sodium: 147 mmol/L — ABNORMAL HIGH (ref 135–145)
Sodium: 147 mmol/L — ABNORMAL HIGH (ref 135–145)
Sodium: 147 mmol/L — ABNORMAL HIGH (ref 135–145)
Sodium: 148 mmol/L — ABNORMAL HIGH (ref 135–145)
Sodium: 148 mmol/L — ABNORMAL HIGH (ref 135–145)
Sodium: 148 mmol/L — ABNORMAL HIGH (ref 135–145)
Sodium: 148 mmol/L — ABNORMAL HIGH (ref 135–145)
Sodium: 148 mmol/L — ABNORMAL HIGH (ref 135–145)
Sodium: 149 mmol/L — ABNORMAL HIGH (ref 135–145)
TCO2: 36 mmol/L — ABNORMAL HIGH (ref 22–32)
TCO2: 37 mmol/L — ABNORMAL HIGH (ref 22–32)
TCO2: 37 mmol/L — ABNORMAL HIGH (ref 22–32)
TCO2: 37 mmol/L — ABNORMAL HIGH (ref 22–32)
TCO2: 38 mmol/L — ABNORMAL HIGH (ref 22–32)
TCO2: 38 mmol/L — ABNORMAL HIGH (ref 22–32)
TCO2: 39 mmol/L — ABNORMAL HIGH (ref 22–32)
TCO2: 40 mmol/L — ABNORMAL HIGH (ref 22–32)
TCO2: 40 mmol/L — ABNORMAL HIGH (ref 22–32)
TCO2: 40 mmol/L — ABNORMAL HIGH (ref 22–32)
TCO2: 40 mmol/L — ABNORMAL HIGH (ref 22–32)
TCO2: 41 mmol/L — ABNORMAL HIGH (ref 22–32)
pCO2 arterial: 50.6 mmHg — ABNORMAL HIGH (ref 32.0–48.0)
pCO2 arterial: 50.7 mmHg — ABNORMAL HIGH (ref 32.0–48.0)
pCO2 arterial: 54.1 mmHg — ABNORMAL HIGH (ref 32.0–48.0)
pCO2 arterial: 56.8 mmHg — ABNORMAL HIGH (ref 32.0–48.0)
pCO2 arterial: 60.3 mmHg — ABNORMAL HIGH (ref 32.0–48.0)
pCO2 arterial: 61.5 mmHg — ABNORMAL HIGH (ref 32.0–48.0)
pCO2 arterial: 63.1 mmHg — ABNORMAL HIGH (ref 32.0–48.0)
pCO2 arterial: 63.7 mmHg — ABNORMAL HIGH (ref 32.0–48.0)
pCO2 arterial: 63.9 mmHg — ABNORMAL HIGH (ref 32.0–48.0)
pCO2 arterial: 67.5 mmHg (ref 32.0–48.0)
pCO2 arterial: 70.4 mmHg (ref 32.0–48.0)
pCO2 arterial: 72.7 mmHg (ref 32.0–48.0)
pH, Arterial: 7.296 — ABNORMAL LOW (ref 7.350–7.450)
pH, Arterial: 7.321 — ABNORMAL LOW (ref 7.350–7.450)
pH, Arterial: 7.345 — ABNORMAL LOW (ref 7.350–7.450)
pH, Arterial: 7.363 (ref 7.350–7.450)
pH, Arterial: 7.366 (ref 7.350–7.450)
pH, Arterial: 7.381 (ref 7.350–7.450)
pH, Arterial: 7.385 (ref 7.350–7.450)
pH, Arterial: 7.39 (ref 7.350–7.450)
pH, Arterial: 7.424 (ref 7.350–7.450)
pH, Arterial: 7.447 (ref 7.350–7.450)
pH, Arterial: 7.449 (ref 7.350–7.450)
pH, Arterial: 7.49 — ABNORMAL HIGH (ref 7.350–7.450)
pO2, Arterial: 122 mmHg — ABNORMAL HIGH (ref 83.0–108.0)
pO2, Arterial: 30 mmHg — CL (ref 83.0–108.0)
pO2, Arterial: 35 mmHg — CL (ref 83.0–108.0)
pO2, Arterial: 429 mmHg — ABNORMAL HIGH (ref 83.0–108.0)
pO2, Arterial: 505 mmHg — ABNORMAL HIGH (ref 83.0–108.0)
pO2, Arterial: 55 mmHg — ABNORMAL LOW (ref 83.0–108.0)
pO2, Arterial: 60 mmHg — ABNORMAL LOW (ref 83.0–108.0)
pO2, Arterial: 63 mmHg — ABNORMAL LOW (ref 83.0–108.0)
pO2, Arterial: 70 mmHg — ABNORMAL LOW (ref 83.0–108.0)
pO2, Arterial: 73 mmHg — ABNORMAL LOW (ref 83.0–108.0)
pO2, Arterial: 81 mmHg — ABNORMAL LOW (ref 83.0–108.0)
pO2, Arterial: 97 mmHg (ref 83.0–108.0)

## 2020-04-08 LAB — HEPATIC FUNCTION PANEL
ALT: 44 U/L (ref 0–44)
AST: 27 U/L (ref 15–41)
Albumin: 2.6 g/dL — ABNORMAL LOW (ref 3.5–5.0)
Alkaline Phosphatase: 226 U/L — ABNORMAL HIGH (ref 38–126)
Bilirubin, Direct: 0.2 mg/dL (ref 0.0–0.2)
Indirect Bilirubin: 0.7 mg/dL (ref 0.3–0.9)
Total Bilirubin: 0.9 mg/dL (ref 0.3–1.2)
Total Protein: 5.5 g/dL — ABNORMAL LOW (ref 6.5–8.1)

## 2020-04-08 LAB — CBC
HCT: 28.9 % — ABNORMAL LOW (ref 39.0–52.0)
HCT: 31.4 % — ABNORMAL LOW (ref 39.0–52.0)
Hemoglobin: 9.3 g/dL — ABNORMAL LOW (ref 13.0–17.0)
Hemoglobin: 10 g/dL — ABNORMAL LOW (ref 13.0–17.0)
MCH: 30.6 pg (ref 26.0–34.0)
MCH: 30.6 pg (ref 26.0–34.0)
MCHC: 32.2 g/dL (ref 30.0–36.0)
MCHC: 31.8 g/dL (ref 30.0–36.0)
MCV: 95.1 fL (ref 80.0–100.0)
MCV: 96 fL (ref 80.0–100.0)
Platelets: 248 10*3/uL (ref 150–400)
Platelets: 294 10*3/uL (ref 150–400)
RBC: 3.04 MIL/uL — ABNORMAL LOW (ref 4.22–5.81)
RBC: 3.27 MIL/uL — ABNORMAL LOW (ref 4.22–5.81)
RDW: 18.5 % — ABNORMAL HIGH (ref 11.5–15.5)
RDW: 18.5 % — ABNORMAL HIGH (ref 11.5–15.5)
WBC: 12.3 10*3/uL — ABNORMAL HIGH (ref 4.0–10.5)
WBC: 19.2 10*3/uL — ABNORMAL HIGH (ref 4.0–10.5)
nRBC: 0.9 % — ABNORMAL HIGH (ref 0.0–0.2)
nRBC: 0.6 % — ABNORMAL HIGH (ref 0.0–0.2)

## 2020-04-08 LAB — BASIC METABOLIC PANEL
Anion gap: 9 (ref 5–15)
Anion gap: 11 (ref 5–15)
BUN: 54 mg/dL — ABNORMAL HIGH (ref 6–20)
BUN: 60 mg/dL — ABNORMAL HIGH (ref 6–20)
CO2: 34 mmol/L — ABNORMAL HIGH (ref 22–32)
CO2: 33 mmol/L — ABNORMAL HIGH (ref 22–32)
Calcium: 8.5 mg/dL — ABNORMAL LOW (ref 8.9–10.3)
Calcium: 8.6 mg/dL — ABNORMAL LOW (ref 8.9–10.3)
Chloride: 103 mmol/L (ref 98–111)
Chloride: 105 mmol/L (ref 98–111)
Creatinine, Ser: 0.83 mg/dL (ref 0.61–1.24)
Creatinine, Ser: 0.73 mg/dL (ref 0.61–1.24)
GFR, Estimated: >60 mL/min (ref >60)
GFR, Estimated: >60 mL/min (ref >60)
Glucose, Bld: 195 mg/dL — ABNORMAL HIGH (ref 70–99)
Glucose, Bld: 157 mg/dL — ABNORMAL HIGH (ref 70–99)
Potassium: 3.8 mmol/L (ref 3.5–5.1)
Potassium: 4.2 mmol/L (ref 3.5–5.1)
Sodium: 146 mmol/L — ABNORMAL HIGH (ref 135–145)
Sodium: 149 mmol/L — ABNORMAL HIGH (ref 135–145)

## 2020-04-08 LAB — TYPE AND SCREEN
ABO/RH(D): B POS
Antibody Screen: NEGATIVE
Unit division: 0
Unit division: 0
Unit division: 0
Unit division: 0
Unit division: 0
Unit division: 0

## 2020-04-08 LAB — LACTIC ACID, PLASMA
Lactic Acid, Venous: 1 mmol/L (ref 0.5–1.9)
Lactic Acid, Venous: 1 mmol/L (ref 0.5–1.9)

## 2020-04-08 LAB — CULTURE, RESPIRATORY W GRAM STAIN: Culture: NORMAL

## 2020-04-08 LAB — MAGNESIUM: Magnesium: 2.7 mg/dL — ABNORMAL HIGH (ref 1.7–2.4)

## 2020-04-08 LAB — BPAM RBC
Blood Product Expiration Date: 202202092359
Blood Product Expiration Date: 202202092359
Blood Product Expiration Date: 202202092359
Blood Product Expiration Date: 202202092359
Blood Product Expiration Date: 202202162359
Blood Product Expiration Date: 202202172359
ISSUE DATE / TIME: 202201240446
ISSUE DATE / TIME: 202201260933
ISSUE DATE / TIME: 202201260933
ISSUE DATE / TIME: 202201260933
ISSUE DATE / TIME: 202201262137
Unit Type and Rh: 7300
Unit Type and Rh: 7300
Unit Type and Rh: 7300
Unit Type and Rh: 7300
Unit Type and Rh: 7300
Unit Type and Rh: 7300

## 2020-04-08 LAB — GLUCOSE, CAPILLARY
Glucose-Capillary: 100 mg/dL — ABNORMAL HIGH (ref 70–99)
Glucose-Capillary: 149 mg/dL — ABNORMAL HIGH (ref 70–99)
Glucose-Capillary: 163 mg/dL — ABNORMAL HIGH (ref 70–99)
Glucose-Capillary: 173 mg/dL — ABNORMAL HIGH (ref 70–99)
Glucose-Capillary: 206 mg/dL — ABNORMAL HIGH (ref 70–99)
Glucose-Capillary: 231 mg/dL — ABNORMAL HIGH (ref 70–99)
Glucose-Capillary: 98 mg/dL (ref 70–99)

## 2020-04-08 LAB — FIBRINOGEN: Fibrinogen: 542 mg/dL — ABNORMAL HIGH (ref 210–475)

## 2020-04-08 LAB — LACTATE DEHYDROGENASE: LDH: 488 U/L — ABNORMAL HIGH (ref 98–192)

## 2020-04-08 LAB — APTT
aPTT: 72 seconds — ABNORMAL HIGH (ref 24–36)
aPTT: 78 seconds — ABNORMAL HIGH (ref 24–36)

## 2020-04-08 LAB — PROTIME-INR
INR: 1.5 — ABNORMAL HIGH (ref 0.8–1.2)
Prothrombin Time: 17.2 seconds — ABNORMAL HIGH (ref 11.4–15.2)

## 2020-04-08 MED ORDER — POTASSIUM CHLORIDE 20 MEQ PO PACK
40.0000 meq | PACK | ORAL | Status: AC
  Administered 2020-04-08 – 2020-04-09 (×2): 40 meq
  Filled 2020-04-08: qty 2

## 2020-04-08 MED ORDER — MORPHINE SULFATE (CONCENTRATE) 10 MG/0.5ML PO SOLN: 15.0000 mg | ORAL | Status: DC | PRN

## 2020-04-08 MED ORDER — CARVEDILOL 6.25 MG PO TABS
6.2500 mg | ORAL_TABLET | Freq: Two times a day (BID) | ORAL | Status: DC
  Administered 2020-04-08: 6.25 mg via ORAL
  Filled 2020-04-08: qty 1

## 2020-04-08 MED ORDER — ZINC OXIDE 40 % EX OINT
TOPICAL_OINTMENT | Freq: Every day | CUTANEOUS | Status: DC
  Administered 2020-04-11 – 2020-05-05 (×30): 1 via TOPICAL
  Filled 2020-04-08 (×11): qty 57

## 2020-04-08 MED ORDER — LORAZEPAM 2 MG/ML IJ SOLN
2.0000 mg | INTRAMUSCULAR | Status: DC | PRN
  Filled 2020-04-08: qty 1

## 2020-04-08 MED ORDER — MORPHINE SULFATE (CONCENTRATE) 10 MG/0.5ML PO SOLN
30.0000 mg | ORAL | Status: DC | PRN
  Administered 2020-04-13: 30 mg
  Filled 2020-04-08 (×3): qty 1.5

## 2020-04-08 MED ORDER — CLONIDINE HCL 0.1 MG PO TABS
0.1000 mg | ORAL_TABLET | Freq: Three times a day (TID) | ORAL | Status: DC
  Administered 2020-04-08 – 2020-04-23 (×42): 0.1 mg
  Filled 2020-04-08 (×44): qty 1

## 2020-04-08 MED ORDER — POTASSIUM CHLORIDE 20 MEQ PO PACK
40.0000 meq | PACK | Freq: Once | ORAL | Status: AC
  Administered 2020-04-08: 40 meq
  Filled 2020-04-08: qty 2

## 2020-04-08 MED ORDER — ACETAZOLAMIDE 250 MG PO TABS
250.0000 mg | ORAL_TABLET | Freq: Every day | ORAL | Status: DC
  Administered 2020-04-08: 250 mg
  Filled 2020-04-08 (×2): qty 1

## 2020-04-08 MED ORDER — ACETAZOLAMIDE 250 MG PO TABS
500.0000 mg | ORAL_TABLET | Freq: Four times a day (QID) | ORAL | Status: AC
Start: 2020-04-08 — End: 2020-04-09
  Administered 2020-04-08 – 2020-04-09 (×2): 500 mg via ORAL
  Filled 2020-04-08 (×2): qty 2

## 2020-04-08 MED ORDER — FUROSEMIDE 10 MG/ML IJ SOLN
40.0000 mg | Freq: Two times a day (BID) | INTRAMUSCULAR | Status: DC
  Administered 2020-04-08 (×2): 40 mg via INTRAVENOUS
  Filled 2020-04-08 (×2): qty 4

## 2020-04-08 MED ORDER — MIDAZOLAM 50MG/50ML (1MG/ML) PREMIX INFUSION
0.0000 mg/h | INTRAVENOUS | Status: DC
  Administered 2020-04-08 – 2020-04-13 (×6): 1 mg/h via INTRAVENOUS
  Administered 2020-04-15: 2 mg/h via INTRAVENOUS
  Administered 2020-04-16: 1 mg/h via INTRAVENOUS
  Filled 2020-04-08 (×11): qty 50

## 2020-04-08 MED ORDER — MIDAZOLAM BOLUS VIA INFUSION
1.0000 mg | INTRAVENOUS | Status: DC | PRN
  Administered 2020-04-08 – 2020-04-09 (×2): 2 mg via INTRAVENOUS
  Administered 2020-04-10: 1 mg via INTRAVENOUS
  Administered 2020-04-10: 2 mg via INTRAVENOUS
  Administered 2020-04-11 – 2020-04-12 (×4): 1 mg via INTRAVENOUS
  Administered 2020-04-13 (×3): 2 mg via INTRAVENOUS
  Filled 2020-04-08: qty 2

## 2020-04-08 NOTE — Progress Notes (Signed)
Orthopedic Tech Progress Note Patient Details:  Alex [name] [birth year] [id number] Called in brace Patient ID: Alex [name], male   DOB: 01-Aug-1970, 50 y.o.   MRN: [id number]   Michelle Piper 04/08/2020, 7:07 PM

## 2020-04-08 NOTE — Progress Notes (Signed)
SLP Cancellation Note  Patient Details Name: Alex [name] MRN: [id number] DOB: 1970/07/01   Cancelled treatment:       Reason Eval/Treat Not Completed: Medical issues which prohibited therapy. SLP checked in with ECMO specialist and RN. RR remains elevated; they recommend holding inline PMV. Will continue to follow a little intermittently to see when he may be ready for therapy services.    Mahala Menghini., M.A. CCC-SLP Acute Rehabilitation Services Pager [phone] Office [phone]  04/08/2020, 8:59 AM

## 2020-04-08 NOTE — Progress Notes (Signed)
ANTICOAGULATION CONSULT NOTE  Pharmacy Consult for bivalirudin Indication: ECMO and VTE  Labs: Recent Labs    04/06/20 0406 04/06/20 0408 04/07/20 0409 04/07/20 0410 04/07/20 1548 04/07/20 1639 04/08/20 0011 04/08/20 0413 04/08/20 0419  HGB 10.9*   < > 8.7*   < > 8.6*   < > 8.8* 9.3* 9.2*  HCT 33.8*   < > 29.3*   < > 27.8*   < > 26.0* 28.9* 27.0*  PLT 227   < > 257  --  254  --   --  248  --   APTT 68*   < > 66*  --  86*  --   --  72*  --   LABPROT 16.1*  --  16.5*  --   --   --   --  17.2*  --   INR 1.3*  --  1.4*  --   --   --   --  1.5*  --   CREATININE 0.77   < > 0.83  --  0.79  --   --  0.83  --    < > = values in this interval not displayed.    Assessment: 67 yoM admitted with COVID-19 PNA with worsening hypoxia, s/p cannulation for ECMO. Pt was started on IV heparin prior to cannulation due to acute DVTs and possible PE, transitioned to bivalirudin with ECMO. Now s/p tPA on 1/5 and tracheostomy on 1/6. ECMO circuit change on 1/26 -APTT at goal -LDH 488, fibriogen= 542 -hg= 9.2  Goal of Therapy:  aPTT 60-80 seconds   Plan:  Continue bivalirudin to 0.08 mg/kg/hr (using order-specific wt 72.1kg) Monitor q12h aPTT/CBC, LDH, and for s/sx of bleeding   Harland German, PharmD Clinical Pharmacist **Pharmacist phone directory can now be found on [email or web address] ([email or web address]).  Listed under [facility].

## 2020-04-08 NOTE — Consult Note (Addendum)
WOC Nurse Consult Note: Patient receiving care in [facility] [phone].  Assisted with turning for assessment by 2 RNs. Reason for Consult: sacral wound Wound type: MASD-IAD-thin liquid feces oozing and leading to tissue deterioration Pressure Injury POA: Yes/No/NA Measurement: Wound bed: pink Drainage (amount, consistency, odor) none.  However, thin stool (not liquid, not pudding consistency) trapped under existing sacral dressing and holding stool in the apex of the intergluteal fold area. Periwound: intact Dressing procedure/placement/frequency: Apply Desitin to buttocks, coccyx area, anal area after cleansing.  I have discontinued the Gerhardt's butt cream.  Unfortunately, this cream does not stand up to frequent thin stooling.  The Desitin is thicker and better able to protect areas impacted by this type of stool.  Monitor the wound area(s) for worsening of condition such as: Signs/symptoms of infection,  Increase in size,  Development of or worsening of odor, Development of pain, or increased pain at the affected locations.  Notify the medical team if any of these develop.  Thank you for the consult.  Discussed plan of care with the patient and bedside nurse.  WOC nurse will not follow at this time.  Please re-consult the WOC team if needed.  Helmut Muster, RN, MSN, CWOCN, CNS-BC, pager [phone]

## 2020-04-08 NOTE — Progress Notes (Signed)
Patient ID: Alex [name], male   DOB: April 28, 1970, 50 y.o.   MRN: [id number]     Advanced Heart Failure Rounding Note  PCP-Cardiologist: No primary care provider on file.   Subjective:    - 12/30: VV ECMO cannulation - 12/31: Left chest tube replaced - 1/2: Extubated. Echo with EF 60-65%, mildly dilated RV with mildly decreased systolic function.  - 1/4: Agitated, suspected aspiration.  Re-intubated.  - 1/5: ECMO cannula repositioned under TEE guidance. TEE showed moderately dilated/moderate-severely dysfunctional RV in setting of hypoxemia. LUE DVT found.  Patient got 1/2 dose of TPA due to initial concern for large PE.  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  - 1/6: Tracheostomy - 1/7: Echo with mild RV dilation/mild RV dysfunction.  - 1/16: Left chest tube out - 1/17: LUE arterial dopplers repeated, showed no obstruction.  - 1/20: Echo with EF 65-70%, mildly D-shaped septum, mildly dilated and mildly dysfunctional RV.  - 1/22: CTA chest: Bilateral upper lobe PEs (suspect chronic), changes c/w ARDS - 1/23: Bronchoscopy showed semi-occlusive ?mass/polyp in the trachea.  - 1/24: Bronchoscopy showed resolution of 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 - 1/26: ECMO circuit changed.   Doing well post ECMO circuit change, sweep down to 4.  He is off IV sedation, only getting po.  +Delirium.  I/Os even yesterday, IV Lasix was off for much of the day though.   Vancomycin for Group F Strep and Enterococcus faecalis in trach aspirate.   Working with PT.   ECMO parameters: 3300 rpm Flow 4.42 L/min Pvenous -86 Delta P 27 Sweep 4 FiO2 0.5  ABG 7.39/64/97/97% LDH  1,117 => 911 => 751 => 699 => 691 => 737 => 671 => 629 => 649 => 629 => 588 => 491 => 524 => 534 => 488 PTT 72 Lactate 1.0  Objective:   Weight Range: 68.1 kg Body mass index is 25.77 kg/m.   Vital Signs:   Temp:  [98.1 F (36.7 C)-98.6 F (37 C)] 98.5 F (36.9 C) (01/27 0400) Pulse Rate:  [86-116] 98 (01/27  0700) Resp:  [6-37] 19 (01/27 0700) BP: (71-204)/(42-88) 98/65 (01/27 0700) SpO2:  [94 %-100 %] 100 % (01/27 0700) Arterial Line BP: (81-182)/(43-82) 129/62 (01/27 0500) FiO2 (%):  [40 %-50 %] 40 % (01/27 0400) Weight:  [68.1 kg] 68.1 kg (01/27 0458) Last BM Date: 04/07/20  Weight change: Filed Weights   04/06/20 0500 04/07/20 0442 04/08/20 0458  Weight: 65 kg 64.6 kg 68.1 kg    Intake/Output:   Intake/Output Summary (Last 24 hours) at 04/08/2020 0735 Last data filed at 04/08/2020 0554 Gross per 24 hour  Intake 3873.93 ml  Output 3725 ml  Net 148.93 ml      Physical Exam    General: Sleeping Neck: Tracheostomy. No JVD, no thyromegaly or thyroid nodule.  Lungs: Decreased at bases CV: Nondisplaced PMI.  Heart regular S1/S2, no S3/S4, no murmur.  No peripheral edema.   Abdomen: Soft, nontender, no hepatosplenomegaly, no distention.  Skin: Intact without lesions or rashes.  Neurologic: Follow commands when awake.  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    Telemetry   NSR 100s Personally reviewed   Labs    CBC Recent Labs    04/07/20 1548 04/07/20 1639 04/08/20 0413 04/08/20 0419  WBC 13.1*  --  12.3*  --   HGB 8.6*   < > 9.3* 9.2*  HCT 27.8*   < > 28.9* 27.0*  MCV 97.9  --  95.1  --  PLT 254  --  248  --    < > = values in this interval not displayed.   Basic Metabolic Panel Recent Labs    04/07/20 1548 04/07/20 1639 04/08/20 0413 04/08/20 0419  NA 145   < > 146* 147*  K 3.5   < > 3.8 3.7  CL 99  --  103  --   CO2 36*  --  34*  --   GLUCOSE 177*  --  195*  --   BUN 56*  --  54*  --   CREATININE 0.79  --  0.83  --   CALCIUM 8.3*  --  8.5*  --   MG  --   --  2.7*  --    < > = values in this interval not displayed.   Liver Function Tests Recent Labs    04/08/20 0413  AST 27  ALT 44  ALKPHOS 226*  BILITOT 0.9  PROT 5.5*  ALBUMIN 2.6*   No results for input(s): LIPASE, AMYLASE in the last 72 hours. Cardiac  Enzymes No results for input(s): CKTOTAL, CKMB, CKMBINDEX, TROPONINI in the last 72 hours.  BNP: BNP (last 3 results) No results for input(s): BNP in the last 8760 hours.  ProBNP (last 3 results) No results for input(s): PROBNP in the last 8760 hours.   D-Dimer No results for input(s): DDIMER in the last 72 hours. Hemoglobin A1C No results for input(s): HGBA1C in the last 72 hours. Fasting Lipid Panel Recent Labs    04/07/20 0409  TRIG 83   Thyroid Function Tests No results for input(s): TSH, T4TOTAL, T3FREE, THYROIDAB in the last 72 hours.  Invalid input(s): FREET3  Other results:   Imaging    No results found.   Medications:     Scheduled Medications: . sodium chloride   Intravenous Once  . bethanechol  10 mg Per Tube TID  . chlorhexidine gluconate (MEDLINE KIT)  15 mL Mouth Rinse BID  . Chlorhexidine Gluconate Cloth  6 each Topical Daily  . chlorpheniramine-HYDROcodone  5 mL Per Tube Q12H  . clonazePAM  1 mg Per Tube Daily  . clonazePAM  3 mg Per Tube QHS  . cloNIDine  0.2 mg Per Tube Q8H  . docusate  100 mg Per Tube BID  . fiber  1 packet Per Tube BID  . free water  100 mL Per Tube Q6H  . furosemide  40 mg Intravenous BID  . Gerhardt's butt cream   Topical BID  . guaiFENesin-dextromethorphan  10 mL Per Tube BID  . insulin aspart  0-20 Units Subcutaneous Q4H  . insulin aspart  6 Units Subcutaneous Q4H  . insulin detemir  35 Units Subcutaneous BID  . lactobacillus acidophilus  2 tablet Per Tube TID  . mouth rinse  15 mL Mouth Rinse 10 times per day  . melatonin  10 mg Per Tube QHS  . metoprolol tartrate  50 mg Per Tube BID  . nitroGLYCERIN  0.5 inch Topical Q6H  . nutrition supplement (JUVEN)  1 packet Per Tube BID BM  . nystatin  5 mL Mouth/Throat QID  . oxyCODONE  15 mg Per Tube Q6H  . pantoprazole sodium  40 mg Per Tube QHS  . QUEtiapine  200 mg Per Tube QHS  . QUEtiapine  50 mg Per Tube BID  . sennosides  10 mL Per Tube QHS  . sodium chloride  flush  10-40 mL Intracatheter Q12H  . valproic acid  250 mg Per  Tube QHS    Infusions: . sodium chloride    . sodium chloride Stopped (03/23/20 0951)  . sodium chloride 250 mL (04/07/20 2203)  . sodium chloride Stopped (03/12/20 0131)  . albumin human Stopped (03/31/20 1745)  . bivalirudin (ANGIOMAX) infusion 0.5 mg/mL (Non-ACS indications) 0.08 mg/kg/hr (04/08/20 0554)  . dexmedetomidine (PRECEDEX) IV infusion 1.5 mcg/kg/hr (04/08/20 0615)  . feeding supplement (PIVOT 1.5 CAL) 1,000 mL (04/05/20 2355)  . ketamine (KETALAR) Adult IV Infusion Stopped (04/04/20 1319)  . norepinephrine (LEVOPHED) Adult infusion Stopped (04/07/20 2234)  . propofol (DIPRIVAN) infusion Stopped (04/07/20 1126)  . vancomycin Stopped (04/07/20 1300)    PRN Medications: Place/Maintain arterial line **AND** sodium chloride, sodium chloride, sodium chloride, acetaminophen (TYLENOL) oral liquid 160 mg/5 mL, albumin human, fentaNYL (SUBLIMAZE) injection, guaiFENesin, haloperidol lactate, hydrALAZINE, labetalol, lip balm, LORazepam, morphine, morphine CONCENTRATE, neomycin-bacitracin-polymyxin, ondansetron (ZOFRAN) IV, oxyCODONE, polyethylene glycol, simethicone, sodium chloride flush   Assessment/Plan   1. Acute hypoxemic respiratory failure: Due to COVID-19 PNA with bilateral infiltrates.  Refractory hypoxemia, VV-ECMO cannulation on 02/29/2020 with improvement in oxygenation.  Developed left PTX post-subclavian CVL and had left chest tube, the left lung is re-expanded and CT out.  He was extubated 1/2 but reintubated 1/4 with agitation and suspected aspiration.  Tracheostomy 1/6.  CTA chest 1/22 with suspected chronic PEs and ARDS. ECMO cannula repositioned 1/5. ECMO circuit changed 1/26. LDH stable today. Sweep at 4 with hypercarbia from significant dead space ventilation.  Now on vancomycin for Group F Strep and Enterococcus faecalis in trach aspirate.  I/Os even.  CXR stable to mildly improved today.  Frequent coughing  when sedation weaned.  - Continue sweep 4 today with persistent hypercarbia (uncomfortable with high work of breathing). Will be slow wean.  - Patient has had remdesivir, tocilizumab. - Completed steroid taper.  - Continue bivalirudin, goal PTT 65-80. He is at 54 today.  - Lasix 40 mg IV bid, keep even to mildly negative.   - Off IV sedation, only with pos.  - Mobilize today with PT, sit him up.  - Control cough. 2. 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  Clot noted on TEE in RV as well.  TTE 1/2 showed normal EF 60-65%, RV improved (mildly dilated/dysfunctional). TEE on 1/5 with moderate to severe RV dysfunction but patient was hypoxemic.  Had 1/2 dose TPA on 1/5. Echo 1/20 with mildly dilated/mildly dysfunctional RV. CTA chest 1/22 with chronic-appearing PEs in upper lobes. - Bivalirudin for goal PTT 65-80.  Management as above 3. Left PTX: Left chest tube, lung is re-expanded. Tube now out, stable CXR. 4. Shock: Suspect septic/distributive.  Now resolved, off NE.  5. Anemia: Hgb 9.3. transfuse < 8.   6. AKI: Resolved.  7. Hyperglycemia: insulin.  8. HTN: BP stable.   - On clonidine.  - Continue metoprolol 50 mg bid.  9. CHB: Episode of CHB when hypoxemic and with cough (suspect vagal).  NSR since then.    - Continue metoprolol, watch rhythm.  10. Thrombocytopenia: Resolved  11. Ileus: Resolved. TFs restarted.  - Getting Reglan  - Cor-trak repositioned to post-pyloric placement 12. Ischemic digits: LUE.  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  Repeat study 1/17 showed no obstruction.  13. ID: Group F Strep in sputum.  Also with Enterococcus faecalis in sputum.  - On vancomycin.  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    CRITICAL CARE Performed by: Loralie Champagne  Total critical care time: 40  minutes  Critical care time was exclusive of separately billable procedures and treating other patients.  Critical care was necessary to treat or prevent imminent or life-threatening deterioration.  Critical care was time spent personally by me on the following activities: development of treatment plan with patient and/or surrogate as well as nursing, discussions with consultants, evaluation of patient's response to treatment, examination of patient, obtaining history from patient or surrogate, ordering and performing treatments and interventions, ordering and review of laboratory studies, ordering and review of radiographic studies, pulse oximetry and re-evaluation of patient's condition.    Length of Stay: [place], MD  04/08/2020, 7:35 AM  Advanced Heart Failure Team Pager [phone] (M-F; 7a - 4p)  Please contact [facility] for night-coverage after hours (4p -7a ) and weekends on [email or web address]  Pre and post ABG show that we are not effectively clearing CO2.  Discussed with ECMO coordinator, will change out circuit today.   Loralie Champagne 04/08/2020 7:35 AM

## 2020-04-08 NOTE — Progress Notes (Signed)
ANTICOAGULATION CONSULT NOTE  Pharmacy Consult for bivalirudin Indication: ECMO and VTE  Labs: Recent Labs    04/06/20 0406 04/06/20 0408 04/07/20 0409 04/07/20 0410 04/07/20 1548 04/07/20 1639 04/08/20 0413 04/08/20 0419 04/08/20 1419 04/08/20 1605 04/08/20 1611  HGB 10.9*   < > 8.7*   < > 8.6*   < > 9.3*   < > 10.5* 10.0* 9.9*  HCT 33.8*   < > 29.3*   < > 27.8*   < > 28.9*   < > 31.0* 31.4* 29.0*  PLT 227   < > 257  --  254  --  248  --   --  294  --   APTT 68*   < > 66*  --  86*  --  72*  --   --  78*  --   LABPROT 16.1*  --  16.5*  --   --   --  17.2*  --   --   --   --   INR 1.3*  --  1.4*  --   --   --  1.5*  --   --   --   --   CREATININE 0.77   < > 0.83  --  0.79  --  0.83  --   --   --   --    < > = values in this interval not displayed.    Assessment: [name] admitted with COVID-19 PNA with worsening hypoxia, s/p cannulation for ECMO. Pt was started on IV heparin prior to cannulation due to acute DVTs and possible PE, transitioned to bivalirudin with ECMO. Now s/p tPA on 1/5 and tracheostomy on 1/6.   APTT therapeutic.  CBC stable; no bleeding reported.  Goal of Therapy:  aPTT 60-80 seconds   Plan:  Continue bivalirudin at 0.08 mg/kg/hr (using order-specific wt 72.1kg) Monitor q12h aPTT/CBC, LDH, and for s/sx of bleeding  Mckenleigh Tarlton D. Laney Potash, PharmD, BCPS, BCCCP 04/08/2020, 5:17 PM

## 2020-04-08 NOTE — Progress Notes (Signed)
Occupational Therapy Treatment Patient Details Name: Alex [name] MRN: [id number] DOB: 07-Nov-1970 Today's Date: 04/08/2020    History of present illness 50 y.o. male  with no significant past medical history admitted on 02/25/2020 with dyspnea, cough, nausea/vomiting ~ 1 week ago with worsening symptoms of body aches and fatigue and +COVID 02/07/20 and admitted with shortness of breath. Required intubation 03/10/20. Cannulated for VV ECMO 02/25/2020. Oxygenating better with ECMO. Also evidence of RLE DVT, RV thrombus, and high suspicion of PE. Has required chest tube to L lung for collapse which needs further reposition with recurrent collapse. Extubated 03-15-19.  Reintubated 1/5 and trach placed 1/6.   OT comments  Patient seen this date to evaluate L splint at the request of nursing.  Of note, fingertips continue to be blackened due to vascular deficits.  L hand and fingers remain flexible, and able to move through full range of motion.  L resting hand splint ordered, and OT to fit and establish wear schedule one it is fitted to him.    Follow Up Recommendations  CIR    Equipment Recommendations  None recommended by OT    Recommendations for Other Services      Precautions / Restrictions Precautions Precautions: Fall Precaution Comments: ECMO,rectal tube, Foley Restrictions Weight Bearing Restrictions: No       Mobility Bed Mobility   Bed Mobility: Rolling Rolling: Max assist;+2 for physical assistance;+2 for safety/equipment            Transfers                        Cognition Arousal/Alertness: Lethargic;Suspect due to medications Behavior During Therapy: Flat affect Overall Cognitive Status: Difficult to assess                                                      General Comments while in tilt performed active assist knee flexion/knee extension. performed x 6 trials prior to pt wtih increased coughing and unable to calm, Rn  requesting pt to return supine    Pertinent Vitals/ Pain                                                                  Frequency  Min 2X/week        Progress Toward Goals  OT Goals(current goals can now be found in the care plan section)     Acute Rehab OT Goals Patient Stated Goal: unable to state OT Goal Formulation: Patient unable to participate in goal setting Time For Goal Achievement: 04/16/20 Potential to Achieve Goals: Fair  Plan      Co-evaluation                 AM-PAC OT "6 Clicks" Daily Activity     Outcome Measure   Help from another person eating meals?: Total Help from another person taking care of personal grooming?: Total Help from another person toileting, which includes using toliet, bedpan, or urinal?: Total Help from another person bathing (including washing, rinsing, drying)?: Total Help from another person to put on and taking off  regular upper body clothing?: Total Help from another person to put on and taking off regular lower body clothing?: Total 6 Click Score: 6    End of Session Equipment Utilized During Treatment: Oxygen  OT Visit Diagnosis: Muscle weakness (generalized) (M62.81)   Activity Tolerance Patient limited by lethargy   Patient Left with call bell/phone within reach;with nursing/sitter in room;in bed   Nurse Communication Need for lift equipment        Time: 1000-1012 OT Time Calculation (min): 12 min  Charges: OT General Charges $OT Visit: 1 Visit OT Treatments $Therapeutic Exercise: 8-22 mins  04/08/2020  Rich, OTR/L  Acute Rehabilitation Services  Office:  [phone]    Suzanna Obey 04/08/2020, 1:06 PM

## 2020-04-08 NOTE — Progress Notes (Signed)
Physical Therapy Treatment Patient Details Name: Alex [name] MRN: [id number] DOB: 05/03/1970 Today's Date: 04/08/2020    History of Present Illness 50 y.o. male  with no significant past medical history admitted on 2020-03-23 with dyspnea, cough, nausea/vomiting ~ 1 week ago with worsening symptoms of body aches and fatigue and +COVID 02/07/20 and admitted with shortness of breath. Required intubation 03/10/20. Cannulated for VV ECMO 02/15/2020. Oxygenating better with ECMO. Also evidence of RLE DVT, RV thrombus, and high suspicion of PE. Has required chest tube to L lung for collapse which needs further reposition with recurrent collapse. Extubated 03-15-19.  Reintubated 1/5 and trach placed 1/6.    PT Comments    Pt with limited participation in session. Pt soiled upon arrival requiring max A for bed mobility with increased coughing and anxiety noted during movement. Performed tilt bed, pt able to tolerate and assist with mini squats, limited due to initiation of coughing and unable to calm. Pt continues to demonstrate deficits in balance, strength, coordination, endurance and safety and will benefit from skilled PT to address deficits to maximize independence with functional mobility prior to discharge.    Follow Up Recommendations  CIR;Supervision/Assistance - 24 hour     Equipment Recommendations  Other (comment) (TBD)    Recommendations for Other Services       Precautions / Restrictions Precautions Precautions: Fall Precaution Comments: ECMO, Vent, rectal tube, Foley Restrictions Weight Bearing Restrictions: No    Mobility  Bed Mobility   Bed Mobility: Rolling Rolling: Max assist;+2 for physical assistance;+2 for safety/equipment            Transfers                    Ambulation/Gait                 Stairs             Wheelchair Mobility    Modified Rankin (Stroke Patients Only)       Balance                                             Cognition Arousal/Alertness: Lethargic;Suspect due to medications Behavior During Therapy: Flat affect Overall Cognitive Status: Difficult to assess                                        Exercises      General Comments General comments (skin integrity, edema, etc.): while in tilt performed active assist knee flexion/knee extension. performed x 6 trials prior to pt wtih increased coughing and unable to calm, Rn requesting pt to return supine      Pertinent Vitals/Pain      Home Living                      Prior Function            PT Goals (current goals can now be found in the care plan section) Acute Rehab PT Goals Patient Stated Goal: unable to state PT Goal Formulation: With patient Time For Goal Achievement: 04/09/20 Potential to Achieve Goals: Fair Progress towards PT goals: Not progressing toward goals - comment (increased cough during tilt)    Frequency    Min 3X/week  PT Plan Current plan remains appropriate    Co-evaluation              AM-PAC PT "6 Clicks" Mobility   Outcome Measure  Help needed turning from your back to your side while in a flat bed without using bedrails?: Total Help needed moving from lying on your back to sitting on the side of a flat bed without using bedrails?: Total Help needed moving to and from a bed to a chair (including a wheelchair)?: Total Help needed standing up from a chair using your arms (e.g., wheelchair or bedside chair)?: Total Help needed to walk in hospital room?: Total Help needed climbing 3-5 steps with a railing? : Total 6 Click Score: 6    End of Session Equipment Utilized During Treatment: Other (comment) (vent with trach) Activity Tolerance: Patient limited by fatigue Patient left: in bed;with nursing/sitter in room Nurse Communication: Other (comment) (nurse in room during session.) PT Visit Diagnosis: Muscle weakness (generalized)  (M62.81);Pain     Time: 5643-3295 PT Time Calculation (min) (ACUTE ONLY): 30 min  Charges:  $Therapeutic Activity: 23-37 mins                     Ginette Otto, DPT Acute Rehabilitation Services [phone]   Lucretia Field 04/08/2020, 12:50 PM

## 2020-04-08 NOTE — Plan of Care (Signed)
  Problem: Activity: Goal: Risk for activity intolerance will decrease Outcome: Progressing Note: Stood in [place] bed with PT today, only able to stand for 9 mins.    Problem: Coping: Goal: Level of anxiety will decrease Outcome: Progressing   Problem: Pain Managment: Goal: General experience of comfort will improve Outcome: Progressing   Problem: Elimination: Goal: Will not experience complications related to bowel motility Outcome: Progressing   Problem: Clinical Measurements: Goal: Respiratory complications will improve Outcome: Not Progressing Note: Increasing sweep requirement throughout day d/t increase in HCO3 and pCO2.

## 2020-04-08 NOTE — Progress Notes (Signed)
ECMO PROGRESS NOTE  NAME:  Alex [name], MRN:  [id number], DOB:  [birth year], LOS: 30 ADMISSION DATE:  02/13/2020, CONSULTATION DATE: 02/19/2020 REFERRING MD: Wynona Neat -LBPCCM, CHIEF COMPLAINT: Respiratory failure requiring ECMO  HPI/course in hospital  50 year old man admitted to hospital 12/28 with 1 week history of dyspnea cough nausea and vomiting.  Initially admitted to [facility] long hospital and placed on high flow nasal cannula but rapidly failed and required intubation 12/29.  Persistent hypoxic respiratory failure with PF ratio 55 in spite of 18 of PEEP FiO2 0.1 despite paralytics.  Did not improve with prone ventilation  Cannulated for VV ECMO 12/30 via right IJ crescent cannula.  ECMO circuit was changed on 04/07/2020 Iatrogenic pneumothorax from left subclavian triple-lumen placement  Past Medical History  none  Interim history/subjective:  Patient is diuresing well on Lasix infusion, remains net even ECMO circuit exchange was done without complications yesterday  We will decrease sedation and try to wake him up  Overnight patient became hypotensive, hemoglobin dropped to 7.9, requiring 1 unit PRBC transfusion Objective   Blood pressure (!) 147/90, pulse (!) 118, temperature 98.4 F (36.9 C), temperature source Core, resp. rate (!) 43, height 5\' 4"  (1.626 m), weight 68.1 kg, SpO2 96 %.    Vent Mode: PCV FiO2 (%):  [40 %-50 %] 40 % Set Rate:  [20 bmp] 20 bmp PEEP:  [8 cmH20-10 cmH20] 8 cmH20 Plateau Pressure:  [23 cmH20-33 cmH20] 31 cmH20   Intake/Output Summary (Last 24 hours) at 04/08/2020 04/10/2020 Last data filed at 04/08/2020 0800 Gross per 24 hour  Intake 3590.41 ml  Output 3675 ml  Net -84.59 ml   Filed Weights   04/06/20 0500 04/07/20 0442 04/08/20 0458  Weight: 65 kg 64.6 kg 68.1 kg    Examination:   Physical exam: General: Acutely ill-appearing male, s/p trach HEENT: [place] /AT, eyes anicteric.  Moist mucous membranes.  ECMO cannulation on right side of the  neck Neuro: Opens eyes with vocal stimuli, following basic commands, moving all 4 extremities spontaneously.  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  Acute delirium, with agitation Gastroparesis with some element ileus Ischemic changes L hand Poorly controlled diabetes type II Acute urinary retention  Continue VV ECMO support  PCO2 remains in 60s but pH is normal, now sweep on ECMO is 4 with 100% FiO2 Continue ultra lung-protective ventilation - follow for spontaneous Vt recovery Completed course of steroid Continue bivalirudin with PTT goal 60-80 Patient was complaining of air hunger continue oral morphine solution  Try to avoid sedation with RASS goal 0/-1 Continue IV vancomycin to complete 10 days therapy Blood pressure remained labile, became hypotensive last night requiring stoppage of sedation now blood pressure has been stable Continue clonidine Continue insulin with fingerstick goal of 140-180 Currently Precedex is off, will continue as needed Continue quetiapine, Klonopin Discontinue Lasix infusion, change to Lasix 40 mg every 12 Continue bethanechol  Daily Goals Checklist  Pain/Anxiety/Delirium protocol (if indicated): Precedex as needed, enteral lorazepam, valproate, morphine prn,  oxycodone, clonidine, quetiapine.  VAP protocol (if indicated): bundle in place.  DVT prophylaxis: bivalirudin GI  prophylaxis: pantoprazole Glucose control: Euglcyemic on combination of SSI and Basal insulin. Mobility/therapy needs:  Mobilization as tolerated.  Code Status: Full code Disposition: ICU.    Total critical care time: 35 minutes  Performed by: Cheri Fowler   Critical care time was exclusive of separately billable procedures and treating other patients.   Critical care was necessary to treat or prevent imminent or life-threatening deterioration.   Critical care was time spent personally by me on the following activities: development of treatment plan with patient and/or surrogate as well as nursing, discussions with consultants, evaluation of patient's response to treatment, examination of patient, obtaining history from patient or surrogate, ordering and performing treatments and interventions, ordering and review of laboratory studies, ordering and review of radiographic studies, pulse oximetry and re-evaluation of patient's condition.   Cheri Fowler MD [facility] See Amion for pager If no response to pager, please call [phone] until 7pm After 7pm, Please call E-link [phone]

## 2020-04-08 NOTE — Procedures (Signed)
Extracorporeal support note  ECLS cannulation date: 30-Mar-2020 Support day 27 Last circuit change: 04/07/2020  Indication: Acute hypoxic respiratory failure due to ARDS from COVID-19 pneumonia with RV dysfunction.   Configuration: Venovenous  Drainage cannula: 32 French crescent cannula via right IJ Return cannula: Same  Pump speed: 3300 RPM Pump flow: 4.42 L/min Pump used: Cardio help  Oxygenator: Cardio help O2 blender: 100% Sweep gas: 4L  Circuit check: No clots Anticoagulant: Bivalirudin Anticoagulation targets: PTT 60-80  Changes in support: Continue current level of support, maintain adequate sweep to prevent hypercarbia. Target pCO2 45-55  Anticipated goals/duration of support: Bridge to recovery.  Multidisciplinary ECMO rounds completed.   Cheri Fowler MD [facility] Pager: [phone] Mobile: [phone]

## 2020-04-08 NOTE — Progress Notes (Signed)
ABGs drawn between the times 2023-2037 reflect pre-oxygenator ABG, post-oxygenator ABG, and patient ABG drawn per orders of Dr. Katrinka Blazing. Pt vitals at the time of drawing were: HR 130's, BP 161/67, RR 25-30 and sats 92%. RPMs at time of drawing were 3400, sweep 7.5.   Pt received all nighttime medications per MAR and PRN versed d/t RASS of 1/2.   ABGs drawn between the times 2148-2153 reflect patient ABG, post-oxygenator ABG, and pre-oxygenator ABG per orders of Dr. Katrinka Blazing. Pt vitals at the time of drawing were HR 115, BP 125/56(73), Sat 98%. RPMs still 3400 and sweep 7.5. RASS currently -2/-3.   All results reported to Dr. Katrinka Blazing. Orders received to keep sweep as is for now, give diamox 500mg  now and at 0400. Orders placed and will follow out as ordered. Updated bedside RN. Will continue to update MD as necessary.

## 2020-04-09 ENCOUNTER — Inpatient Hospital Stay (HOSPITAL_COMMUNITY): Payer: Medicaid Other

## 2020-04-09 LAB — POCT I-STAT 7, (LYTES, BLD GAS, ICA,H+H)
Acid-Base Excess: 1 mmol/L (ref 0.0–2.0)
Acid-Base Excess: 1 mmol/L (ref 0.0–2.0)
Acid-Base Excess: 3 mmol/L — ABNORMAL HIGH (ref 0.0–2.0)
Acid-Base Excess: 3 mmol/L — ABNORMAL HIGH (ref 0.0–2.0)
Acid-Base Excess: 3 mmol/L — ABNORMAL HIGH (ref 0.0–2.0)
Acid-Base Excess: 5 mmol/L — ABNORMAL HIGH (ref 0.0–2.0)
Acid-Base Excess: 9 mmol/L — ABNORMAL HIGH (ref 0.0–2.0)
Bicarbonate: 26.6 mmol/L (ref 20.0–28.0)
Bicarbonate: 27.5 mmol/L (ref 20.0–28.0)
Bicarbonate: 29.4 mmol/L — ABNORMAL HIGH (ref 20.0–28.0)
Bicarbonate: 30.2 mmol/L — ABNORMAL HIGH (ref 20.0–28.0)
Bicarbonate: 30.6 mmol/L — ABNORMAL HIGH (ref 20.0–28.0)
Bicarbonate: 32.3 mmol/L — ABNORMAL HIGH (ref 20.0–28.0)
Bicarbonate: 35.2 mmol/L — ABNORMAL HIGH (ref 20.0–28.0)
Calcium, Ion: 1.25 mmol/L (ref 1.15–1.40)
Calcium, Ion: 1.29 mmol/L (ref 1.15–1.40)
Calcium, Ion: 1.3 mmol/L (ref 1.15–1.40)
Calcium, Ion: 1.3 mmol/L (ref 1.15–1.40)
Calcium, Ion: 1.32 mmol/L (ref 1.15–1.40)
Calcium, Ion: 1.35 mmol/L (ref 1.15–1.40)
Calcium, Ion: 1.37 mmol/L (ref 1.15–1.40)
HCT: 25 % — ABNORMAL LOW (ref 39.0–52.0)
HCT: 27 % — ABNORMAL LOW (ref 39.0–52.0)
HCT: 27 % — ABNORMAL LOW (ref 39.0–52.0)
HCT: 28 % — ABNORMAL LOW (ref 39.0–52.0)
HCT: 28 % — ABNORMAL LOW (ref 39.0–52.0)
HCT: 29 % — ABNORMAL LOW (ref 39.0–52.0)
HCT: 30 % — ABNORMAL LOW (ref 39.0–52.0)
Hemoglobin: 10.2 g/dL — ABNORMAL LOW (ref 13.0–17.0)
Hemoglobin: 8.5 g/dL — ABNORMAL LOW (ref 13.0–17.0)
Hemoglobin: 9.2 g/dL — ABNORMAL LOW (ref 13.0–17.0)
Hemoglobin: 9.2 g/dL — ABNORMAL LOW (ref 13.0–17.0)
Hemoglobin: 9.5 g/dL — ABNORMAL LOW (ref 13.0–17.0)
Hemoglobin: 9.5 g/dL — ABNORMAL LOW (ref 13.0–17.0)
Hemoglobin: 9.9 g/dL — ABNORMAL LOW (ref 13.0–17.0)
O2 Saturation: 90 %
O2 Saturation: 90 %
O2 Saturation: 91 %
O2 Saturation: 93 %
O2 Saturation: 93 %
O2 Saturation: 95 %
O2 Saturation: 96 %
Patient temperature: 36.8
Patient temperature: 36.8
Patient temperature: 36.8
Patient temperature: 36.9
Patient temperature: 36.9
Patient temperature: 37
Patient temperature: 37
Potassium: 3.7 mmol/L (ref 3.5–5.1)
Potassium: 3.7 mmol/L (ref 3.5–5.1)
Potassium: 3.8 mmol/L (ref 3.5–5.1)
Potassium: 3.8 mmol/L (ref 3.5–5.1)
Potassium: 4 mmol/L (ref 3.5–5.1)
Potassium: 4.1 mmol/L (ref 3.5–5.1)
Potassium: 4.3 mmol/L (ref 3.5–5.1)
Sodium: 150 mmol/L — ABNORMAL HIGH (ref 135–145)
Sodium: 150 mmol/L — ABNORMAL HIGH (ref 135–145)
Sodium: 150 mmol/L — ABNORMAL HIGH (ref 135–145)
Sodium: 150 mmol/L — ABNORMAL HIGH (ref 135–145)
Sodium: 151 mmol/L — ABNORMAL HIGH (ref 135–145)
Sodium: 151 mmol/L — ABNORMAL HIGH (ref 135–145)
Sodium: 151 mmol/L — ABNORMAL HIGH (ref 135–145)
TCO2: 28 mmol/L (ref 22–32)
TCO2: 29 mmol/L (ref 22–32)
TCO2: 31 mmol/L (ref 22–32)
TCO2: 32 mmol/L (ref 22–32)
TCO2: 33 mmol/L — ABNORMAL HIGH (ref 22–32)
TCO2: 34 mmol/L — ABNORMAL HIGH (ref 22–32)
TCO2: 37 mmol/L — ABNORMAL HIGH (ref 22–32)
pCO2 arterial: 45.8 mmHg (ref 32.0–48.0)
pCO2 arterial: 52.2 mmHg — ABNORMAL HIGH (ref 32.0–48.0)
pCO2 arterial: 56.6 mmHg — ABNORMAL HIGH (ref 32.0–48.0)
pCO2 arterial: 57.7 mmHg — ABNORMAL HIGH (ref 32.0–48.0)
pCO2 arterial: 58.7 mmHg — ABNORMAL HIGH (ref 32.0–48.0)
pCO2 arterial: 59.4 mmHg — ABNORMAL HIGH (ref 32.0–48.0)
pCO2 arterial: 63.2 mmHg — ABNORMAL HIGH (ref 32.0–48.0)
pH, Arterial: 7.292 — ABNORMAL LOW (ref 7.350–7.450)
pH, Arterial: 7.324 — ABNORMAL LOW (ref 7.350–7.450)
pH, Arterial: 7.326 — ABNORMAL LOW (ref 7.350–7.450)
pH, Arterial: 7.328 — ABNORMAL LOW (ref 7.350–7.450)
pH, Arterial: 7.348 — ABNORMAL LOW (ref 7.350–7.450)
pH, Arterial: 7.372 (ref 7.350–7.450)
pH, Arterial: 7.381 (ref 7.350–7.450)
pO2, Arterial: 62 mmHg — ABNORMAL LOW (ref 83.0–108.0)
pO2, Arterial: 65 mmHg — ABNORMAL LOW (ref 83.0–108.0)
pO2, Arterial: 68 mmHg — ABNORMAL LOW (ref 83.0–108.0)
pO2, Arterial: 72 mmHg — ABNORMAL LOW (ref 83.0–108.0)
pO2, Arterial: 78 mmHg — ABNORMAL LOW (ref 83.0–108.0)
pO2, Arterial: 81 mmHg — ABNORMAL LOW (ref 83.0–108.0)
pO2, Arterial: 88 mmHg (ref 83.0–108.0)

## 2020-04-09 LAB — BASIC METABOLIC PANEL
Anion gap: 9 (ref 5–15)
Anion gap: 9 (ref 5–15)
BUN: 56 mg/dL — ABNORMAL HIGH (ref 6–20)
BUN: 59 mg/dL — ABNORMAL HIGH (ref 6–20)
CO2: 30 mmol/L (ref 22–32)
CO2: 28 mmol/L (ref 22–32)
Calcium: 8.9 mg/dL (ref 8.9–10.3)
Calcium: 9.2 mg/dL (ref 8.9–10.3)
Chloride: 110 mmol/L (ref 98–111)
Chloride: 114 mmol/L — ABNORMAL HIGH (ref 98–111)
Creatinine, Ser: 0.82 mg/dL (ref 0.61–1.24)
Creatinine, Ser: 0.76 mg/dL (ref 0.61–1.24)
GFR, Estimated: >60 mL/min (ref >60)
GFR, Estimated: >60 mL/min (ref >60)
Glucose, Bld: 157 mg/dL — ABNORMAL HIGH (ref 70–99)
Glucose, Bld: 205 mg/dL — ABNORMAL HIGH (ref 70–99)
Potassium: 4.2 mmol/L (ref 3.5–5.1)
Potassium: 3.8 mmol/L (ref 3.5–5.1)
Sodium: 149 mmol/L — ABNORMAL HIGH (ref 135–145)
Sodium: 151 mmol/L — ABNORMAL HIGH (ref 135–145)

## 2020-04-09 LAB — PROTIME-INR
INR: 1.5 — ABNORMAL HIGH (ref 0.8–1.2)
Prothrombin Time: 17.3 seconds — ABNORMAL HIGH (ref 11.4–15.2)

## 2020-04-09 LAB — LACTIC ACID, PLASMA
Lactic Acid, Venous: 1.1 mmol/L (ref 0.5–1.9)
Lactic Acid, Venous: 0.6 mmol/L (ref 0.5–1.9)

## 2020-04-09 LAB — CBC
HCT: 31.1 % — ABNORMAL LOW (ref 39.0–52.0)
HCT: 31.9 % — ABNORMAL LOW (ref 39.0–52.0)
Hemoglobin: 9.9 g/dL — ABNORMAL LOW (ref 13.0–17.0)
Hemoglobin: 9.2 g/dL — ABNORMAL LOW (ref 13.0–17.0)
MCH: 31.1 pg (ref 26.0–34.0)
MCH: 29.2 pg (ref 26.0–34.0)
MCHC: 31.8 g/dL (ref 30.0–36.0)
MCHC: 28.8 g/dL — ABNORMAL LOW (ref 30.0–36.0)
MCV: 97.8 fL (ref 80.0–100.0)
MCV: 101.3 fL — ABNORMAL HIGH (ref 80.0–100.0)
Platelets: 309 10*3/uL (ref 150–400)
Platelets: 318 10*3/uL (ref 150–400)
RBC: 3.18 MIL/uL — ABNORMAL LOW (ref 4.22–5.81)
RBC: 3.15 MIL/uL — ABNORMAL LOW (ref 4.22–5.81)
RDW: 18 % — ABNORMAL HIGH (ref 11.5–15.5)
RDW: 17.8 % — ABNORMAL HIGH (ref 11.5–15.5)
WBC: 16.3 10*3/uL — ABNORMAL HIGH (ref 4.0–10.5)
WBC: 16.1 10*3/uL — ABNORMAL HIGH (ref 4.0–10.5)
nRBC: 0.5 % — ABNORMAL HIGH (ref 0.0–0.2)
nRBC: 0.6 % — ABNORMAL HIGH (ref 0.0–0.2)

## 2020-04-09 LAB — GLUCOSE, CAPILLARY
Glucose-Capillary: 135 mg/dL — ABNORMAL HIGH (ref 70–99)
Glucose-Capillary: 181 mg/dL — ABNORMAL HIGH (ref 70–99)
Glucose-Capillary: 183 mg/dL — ABNORMAL HIGH (ref 70–99)
Glucose-Capillary: 184 mg/dL — ABNORMAL HIGH (ref 70–99)
Glucose-Capillary: 190 mg/dL — ABNORMAL HIGH (ref 70–99)
Glucose-Capillary: 192 mg/dL — ABNORMAL HIGH (ref 70–99)

## 2020-04-09 LAB — APTT
aPTT: 79 seconds — ABNORMAL HIGH (ref 24–36)
aPTT: 79 seconds — ABNORMAL HIGH (ref 24–36)

## 2020-04-09 LAB — MRSA PCR SCREENING: MRSA by PCR: NEGATIVE

## 2020-04-09 LAB — LACTATE DEHYDROGENASE: LDH: 547 U/L — ABNORMAL HIGH (ref 98–192)

## 2020-04-09 LAB — FIBRINOGEN: Fibrinogen: 684 mg/dL — ABNORMAL HIGH (ref 210–475)

## 2020-04-09 MED ORDER — POTASSIUM CHLORIDE 20 MEQ PO PACK
40.0000 meq | PACK | Freq: Once | ORAL | Status: AC
  Administered 2020-04-09: 40 meq
  Filled 2020-04-09: qty 2

## 2020-04-09 MED ORDER — PHENOL 1.4 % MT LIQD
1.0000 | OROMUCOSAL | Status: DC | PRN
  Administered 2020-04-10 – 2020-04-12 (×4): 1 via OROMUCOSAL
  Filled 2020-04-09: qty 177

## 2020-04-09 MED ORDER — LIDOCAINE HCL 1 % IJ SOLN
10.0000 mL | INTRAMUSCULAR | Status: DC | PRN
  Administered 2020-04-09 (×2): 10 mL via RESPIRATORY_TRACT
  Filled 2020-04-09 (×3): qty 10

## 2020-04-09 MED ORDER — ACETAZOLAMIDE 250 MG PO TABS
500.0000 mg | ORAL_TABLET | Freq: Two times a day (BID) | ORAL | Status: DC
  Administered 2020-04-09 – 2020-04-10 (×4): 500 mg via ORAL
  Filled 2020-04-09 (×5): qty 2

## 2020-04-09 MED ORDER — CARVEDILOL 12.5 MG PO TABS
12.5000 mg | ORAL_TABLET | Freq: Two times a day (BID) | ORAL | Status: DC
  Administered 2020-04-09 – 2020-04-11 (×6): 12.5 mg via ORAL
  Filled 2020-04-09 (×6): qty 1

## 2020-04-09 MED ORDER — LIDOCAINE HCL 1 % IJ SOLN
10.0000 mL | Freq: Four times a day (QID) | INTRAMUSCULAR | Status: DC | PRN
  Administered 2020-04-10: 10 mL via RESPIRATORY_TRACT
  Filled 2020-04-09 (×2): qty 10

## 2020-04-09 MED ORDER — DEXTROMETHORPHAN POLISTIREX ER 30 MG/5ML PO SUER
30.0000 mg | Freq: Two times a day (BID) | ORAL | Status: DC
  Administered 2020-04-09: 30 mg via ORAL
  Administered 2020-04-09: 30 mg
  Filled 2020-04-09 (×3): qty 5

## 2020-04-09 NOTE — Procedures (Signed)
Extracorporeal support note  ECLS cannulation date: 2020-04-10 Support day 28 Last circuit change: 04/07/2020  Indication: Acute hypoxic respiratory failure due to ARDS from COVID-19 pneumonia with RV dysfunction.    Configuration: Venovenous  Drainage cannula: 32 French crescent cannula via right IJ Return cannula: Same  Pump speed: 3300 RPM Pump flow: 4.42 L/min Pump used: Cardio help  Oxygenator: Cardio help O2 blender: 100% Sweep gas: 7.5L  Circuit check: No clots Anticoagulant: Bivalirudin Anticoagulation targets: PTT 60-80  Changes in support: Continue current level of support, maintain adequate sweep to prevent hypercarbia. Target pCO2 45-55  Anticipated goals/duration of support: Bridge to recovery.  Multidisciplinary ECMO rounds completed.   Critical care time: The patient is critically ill with multiple organ systems failure and requires high complexity decision making for assessment and support, frequent evaluation and titration of therapies, application of advanced monitoring technologies and extensive interpretation of multiple databases.  Critical care time 35 mins. This represents my time independent of the NPs time taking care of the pt. This is excluding procedures.    Briant Sites DO [facility] 04/09/2020, 11:56 AM

## 2020-04-09 NOTE — Progress Notes (Signed)
ANTICOAGULATION CONSULT NOTE  Pharmacy Consult for bivalirudin Indication: ECMO and VTE  Labs: Recent Labs    04/07/20 0409 04/07/20 0410 04/08/20 0413 04/08/20 0419 04/08/20 1605 04/08/20 1611 04/09/20 0409 04/09/20 0418 04/09/20 1145 04/09/20 1603 04/09/20 1606  HGB 8.7*   < > 9.3*   < > 10.0*   < > 9.9*   < > 9.2* 9.2* 9.5*  HCT 29.3*   < > 28.9*   < > 31.4*   < > 31.1*   < > 27.0* 31.9* 28.0*  PLT 257   < > 248  --  294  --  309  --   --  318  --   APTT 66*   < > 72*  --  78*  --  79*  --   --  79*  --   LABPROT 16.5*  --  17.2*  --   --   --  17.3*  --   --   --   --   INR 1.4*  --  1.5*  --   --   --  1.5*  --   --   --   --   CREATININE 0.83   < > 0.83  --  0.73  --  0.82  --   --  0.76  --    < > = values in this interval not displayed.    Assessment: 41 yoM admitted with COVID-19 PNA with worsening hypoxia, s/p cannulation for ECMO. Pt was started on IV heparin prior to cannulation due to acute DVTs and possible PE, transitioned to bivalirudin with ECMO. Now s/p tPA on 1/5 and tracheostomy on 1/6.   APTT remains therapeutic and stable; no bleeding reported.   Goal of Therapy:  aPTT 60-80 seconds   Plan:  Continue bivalirudin at 0.08 mg/kg/hr (using order-specific wt 72.1kg) Monitor q12h aPTT/CBC, LDH, and for s/sx of bleeding  Maralyn Witherell D. Laney Potash, PharmD, BCPS, BCCCP 04/09/2020, 6:52 PM

## 2020-04-09 NOTE — Plan of Care (Signed)
  Problem: Education: Goal: Knowledge of risk factors and measures for prevention of condition will improve Outcome: Progressing   Problem: Coping: Goal: Psychosocial and spiritual needs will be supported Outcome: Progressing   Problem: Respiratory: Goal: Will maintain a patent airway Outcome: Progressing Goal: Complications related to the disease process, condition or treatment will be avoided or minimized Outcome: Progressing   Problem: Activity: Goal: Ability to tolerate increased activity will improve Outcome: Progressing   Problem: Respiratory: Goal: Ability to maintain a clear airway and adequate ventilation will improve Outcome: Progressing   Problem: Role Relationship: Goal: Method of communication will improve Outcome: Progressing   Problem: Health Behavior/Discharge Planning: Goal: Ability to manage health-related needs will improve Outcome: Progressing   Problem: Clinical Measurements: Goal: Ability to maintain clinical measurements within normal limits will improve Outcome: Progressing Goal: Will remain free from infection Outcome: Progressing Goal: Diagnostic test results will improve Outcome: Progressing Goal: Respiratory complications will improve Outcome: Progressing Goal: Cardiovascular complication will be avoided Outcome: Progressing   Problem: Activity: Goal: Risk for activity intolerance will decrease Outcome: Progressing   Problem: Coping: Goal: Level of anxiety will decrease Outcome: Progressing   Problem: Pain Managment: Goal: General experience of comfort will improve Outcome: Progressing   Problem: Elimination: Goal: Will not experience complications related to bowel motility Outcome: Progressing Goal: Will not experience complications related to urinary retention Outcome: Progressing   Problem: Safety: Goal: Ability to remain free from injury will improve Outcome: Progressing   Problem: Skin Integrity: Goal: Risk for impaired  skin integrity will decrease Outcome: Progressing

## 2020-04-09 NOTE — Progress Notes (Signed)
Pharmacy Antibiotic Note  Alex [name] is a 50 y.o. male admitted on 03/08/2020 with streptococcal/enterococcal pneumonia.  Pharmacy has been consulted for vancomycin dosing  (day 7/8) -WBC=16.3 afebrile, SCr= 0.8   Plan: -Change vancomycin to 1268m IV q24h (calculated AUC= 516); end date 1/29 -Will follow renal function and clinical progress   Height: _0  (162.6 cm) Weight: 66.7 kg (147 lb 0.8 oz) IBW/kg (Calculated) : 59.2  Temp (24hrs), Avg:98.7 F (37.1 C), Min:98.4 F (36.9 C), Max:99.3 F (37.4 C)  Recent Labs  Lab 04/05/20 2157 04/06/20 0406 04/06/20 0748 04/06/20 1610 04/07/20 0409 04/07/20 0410 04/07/20 1548 04/08/20 0413 04/08/20 1605 04/09/20 0409  WBC  --    < >  --    < > 10.8*  --  13.1* 12.3* 19.2* 16.3*  CREATININE  --    < >  --    < > 0.83  --  0.79 0.83 0.73 0.82  LATICACIDVEN  --   --   --   --   --  1.1 0.8 1.0 1.0 1.1  VANCOTROUGH  --   --  15  --   --   --   --   --   --   --   VANCOPEAK 37  --   --   --   --   --   --   --   --   --    < > = values in this interval not displayed.    Estimated Creatinine Clearance: 93.3 mL/min (by C-G formula based on SCr of 0.82 mg/dL).    No Known Allergies  Antimicrobials this admission: Remdesivir 12/28-1/1 CTX/Azith starting 12/28 x 5 days> end 1/2 Vanc 1/3>>1/10, restart 1/22>> Cefepime 1/3>>1/12 Cefazolin 1/20>>1/22    Microbiology results: 1/18 BAL: >100k groupF Strep, 80k enterococcus (pan sen) 1/6 TA normal flora 12/28 BCx x 2 > propionibacterium 1/3BCx femoral stick > staph hominis,lugdun,epi 1/3UCx: neg 12/28MRSA PCR: neg   Thank you for allowing pharmacy to be a part of this patient's care.  AHildred Laser PharmD Clinical Pharmacist **Pharmacist phone directory can now be found on [email or web address] ([email or web address]).  Listed under [address]

## 2020-04-09 NOTE — Progress Notes (Addendum)
ECMO PROGRESS NOTE  NAME:  Alex [name], MRN:  [id number], DOB:  1970/10/16, LOS: 31 ADMISSION DATE:  03/02/2020, CONSULTATION DATE: 03/03/2020 REFERRING MD: Wynona Neat -LBPCCM, CHIEF COMPLAINT: Respiratory failure requiring ECMO  HPI/course in hospital  50 year old man admitted to hospital 12/28 with 1 week history of dyspnea cough nausea and vomiting.  Initially admitted to [facility] long hospital and placed on high flow nasal cannula but rapidly failed and required intubation 12/29.  Persistent hypoxic respiratory failure with PF ratio 55 in spite of 18 of PEEP FiO2 0.1 despite paralytics.  Did not improve with prone ventilation  Cannulated for VV ECMO 12/30 via right IJ crescent cannula.  ECMO circuit was changed on 04/07/2020 Iatrogenic pneumothorax from left subclavian triple-lumen placement  Past Medical History  none  Interim history/subjective:  1/28:off lasix, bicarb improved somewhat iwith increased acetazolamide. On sweep 7.5. awake and following commands. Worsening and persistent cough.   1/27:Patient is diuresing well on Lasix infusion, remains net even ECMO circuit exchange was done without complications yesterday  We will decrease sedation and try to wake him up  Overnight patient became hypotensive, hemoglobin dropped to 7.9, requiring 1 unit PRBC transfusion Objective   Blood pressure 123/83, pulse (!) 110, temperature 98.6 F (37 C), temperature source Core, resp. rate 20, height 5\' 4"  (1.626 m), weight 66.7 kg, SpO2 97 %.    Vent Mode: PCV FiO2 (%):  [40 %] 40 % Set Rate:  [20 bmp] 20 bmp PEEP:  [5 cmH20-10 cmH20] 10 cmH20 Plateau Pressure:  [21 cmH20-30 cmH20] 21 cmH20   Intake/Output Summary (Last 24 hours) at 04/09/2020 1135 Last data filed at 04/09/2020 1100 Gross per 24 hour  Intake 3301.7 ml  Output 4190 ml  Net -888.3 ml   Filed Weights   04/07/20 0442 04/08/20 0458 04/09/20 0424  Weight: 64.6 kg 68.1 kg 66.7 kg    Examination:   Physical  exam: General: Acutely ill-appearing male, s/p trach HEENT: [place]/AT, eyes anicteric.  Moist mucous membranes.  ECMO cannulation on right side of the neck Neuro: Opens eyes with vocal stimuli, following basic commands, moving all 4 extremities spontaneously.  Pupils 3 mm bilateral reactive to light Chest: Faint bilateral basal crackles, no wheezes or rhonchi Heart: Regular rate and rhythm, no murmurs or gallops Abdomen: Soft, nontender, nondistended, bowel sounds present Skin: Ischemic left hand  Assessment & Plan:  Acute hypoxemic/hypercapnic respiratory failure due to severe ARDS from COVID-19 pneumonia Probable acute PE, and RV dysfunction s/p TPA Refractory coughing Strep group F/enterococcal pneumonia -Continue VV ECMO support  -PCO2 remains in 60s but pH is normal, now sweep on ECMO is 7.5 with 100% FiO2 -Continue ultra lung-protective ventilation - follow for spontaneous Vt recovery -Completed course of steroid -Continue bivalirudin with PTT goal 60-80 -Patient was complaining of air hunger continue oral morphine solution  -Try to avoid sedation with RASS goal 0/-1 -Continue IV vancomycin to complete 10 days therapy -add dextromethorphan -cxr worsening today but peep was decreased as well from 10 to 5, lasix stopped and circuit changed recently HTN - labile and possibly circuit related.  -reinitiated coreg  Acute delirium, with agitation -remains on versed but alert and following commands -also numerous oral/prn iv agents  Gastroparesis with some element ileus Ischemic changes L hand -wound following  Poorly controlled diabetes type II -insulin  Acute urinary retention Foley bethanechol      Daily Goals Checklist  Pain/Anxiety/Delirium protocol (if indicated): on versed infusion now. Off other continuous VAP protocol (  if indicated): bundle in place.  DVT prophylaxis: bivalirudin GI prophylaxis: pantoprazole Glucose control: Euglcyemic on combination of SSI and  Basal insulin. Mobility/therapy needs: Mobilization as tolerated, pt is more deconditioned than previous Code Status: Full code Disposition: ICU.    Critical care time: The patient is critically ill with multiple organ systems failure and requires high complexity decision making for assessment and support, frequent evaluation and titration of therapies, application of advanced monitoring technologies and extensive interpretation of multiple databases.  Critical care time 43 mins. This represents my time independent of the NPs time taking care of the pt. This is excluding procedures.    Briant Sites DO [facility] 04/09/2020, 11:35 AM

## 2020-04-09 NOTE — Progress Notes (Signed)
Patient ID: Alex [name], male   DOB: 07/02/1970, 50 y.o.   MRN: [id number] Patient ID: Alex [name], male   DOB: November 27, 1970, 50 y.o.   MRN: [id number]     Advanced Heart Failure Rounding Note  PCP-Cardiologist: No primary care provider on file.   Subjective:    - 12/30: VV ECMO cannulation - 12/31: Left chest tube replaced - 1/2: Extubated. Echo with EF 60-65%, mildly dilated RV with mildly decreased systolic function.  - 1/4: Agitated, suspected aspiration.  Re-intubated.  - 1/5: ECMO cannula repositioned under TEE guidance. TEE showed moderately dilated/moderate-severely dysfunctional RV in setting of hypoxemia. LUE DVT found.  Patient got 1/2 dose of TPA due to initial concern for large PE.  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  - 1/6: Tracheostomy - 1/7: Echo with mild RV dilation/mild RV dysfunction.  - 1/16: Left chest tube out - 1/17: LUE arterial dopplers repeated, showed no obstruction.  - 1/20: Echo with EF 65-70%, mildly D-shaped septum, mildly dilated and mildly dysfunctional RV.  - 1/22: CTA chest: Bilateral upper lobe PEs (suspect chronic), changes c/w ARDS - 1/23: Bronchoscopy showed semi-occlusive ?mass/polyp in the trachea.  - 1/24: Bronchoscopy showed resolution of 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 - 1/26: ECMO circuit changed.   Sweep 7.5 today. ABG improved with lower CO2 after pushing acetazolamide overnight.  I/Os negative but CXR looks a bit worse today.   Off Precedex and on low dose Versed, seems to do better with this => awake and appropriate this morning.   Vancomycin for Group F Strep and Enterococcus faecalis in trach aspirate.   Working with PT.   ECMO parameters: 3300 rpm Flow 4.16 L/min Pvenous -89 Delta P 27 Sweep 7.5 FiO2 0.5 TV close to 300 cc  ABG 7.35/59/65/90% LDH  1,117 => 911 => 751 => 699 => 691 => 737 => 671 => 629 => 649 => 629 => 588 => 491 => 524 => 534 => 488 => 547 PTT 79 Lactate 1.1  Objective:   Weight  Range: 66.7 kg Body mass index is 25.24 kg/m.   Vital Signs:   Temp:  [98.4 F (36.9 C)-99.3 F (37.4 C)] 98.6 F (37 C) (01/28 0400) Pulse Rate:  [101-127] 114 (01/28 0600) Resp:  [18-45] 21 (01/28 0600) BP: (96-180)/(61-145) 114/76 (01/28 0600) SpO2:  [90 %-99 %] 97 % (01/28 0600) Arterial Line BP: (126-242)/(55-109) 135/61 (01/28 0600) FiO2 (%):  [40 %] 40 % (01/28 0400) Weight:  [66.7 kg] 66.7 kg (01/28 0424) Last BM Date: 04/07/20  Weight change: Filed Weights   04/07/20 0442 04/08/20 0458 04/09/20 0424  Weight: 64.6 kg 68.1 kg 66.7 kg    Intake/Output:   Intake/Output Summary (Last 24 hours) at 04/09/2020 0742 Last data filed at 04/09/2020 0647 Gross per 24 hour  Intake 3416.32 ml  Output 4765 ml  Net -1348.68 ml      Physical Exam    General: Awake  Neck: Tracheostomy. No JVD, no thyromegaly or thyroid nodule.  Lungs: Crackles at bases.  CV: Nondisplaced PMI.  Heart tachy, regular S1/S2, no S3/S4, no murmur.  No peripheral edema.   Abdomen: Soft, nontender, no hepatosplenomegaly, no distention.  Skin: Intact without lesions or rashes.  Neurologic: Follows commands.  Extremities: Left hand dry gangrene fingertips.   HEENT: Normal.    Telemetry   NSR 110s Personally reviewed   Labs    CBC Recent Labs    04/08/20 1605 04/08/20 1611 04/09/20 0409 04/09/20 0418  WBC 19.2*  --  16.3*  --   HGB 10.0*   < > 9.9* 10.2*  HCT 31.4*   < > 31.1* 30.0*  MCV 96.0  --  97.8  --   PLT 294  --  309  --    < > = values in this interval not displayed.   Basic Metabolic Panel Recent Labs    04/08/20 0413 04/08/20 0419 04/08/20 1605 04/08/20 1611 04/09/20 0409 04/09/20 0418  NA 146*   < > 149*   < > 149* 150*  K 3.8   < > 4.2   < > 4.2 4.1  CL 103  --  105  --  110  --   CO2 34*  --  33*  --  30  --   GLUCOSE 195*  --  157*  --  157*  --   BUN 54*  --  60*  --  56*  --   CREATININE 0.83  --  0.73  --  0.82  --   CALCIUM 8.5*  --  8.6*  --  8.9  --    MG 2.7*  --   --   --   --   --    < > = values in this interval not displayed.   Liver Function Tests Recent Labs    04/08/20 0413  AST 27  ALT 44  ALKPHOS 226*  BILITOT 0.9  PROT 5.5*  ALBUMIN 2.6*   No results for input(s): LIPASE, AMYLASE in the last 72 hours. Cardiac Enzymes No results for input(s): CKTOTAL, CKMB, CKMBINDEX, TROPONINI in the last 72 hours.  BNP: BNP (last 3 results) No results for input(s): BNP in the last 8760 hours.  ProBNP (last 3 results) No results for input(s): PROBNP in the last 8760 hours.   D-Dimer No results for input(s): DDIMER in the last 72 hours. Hemoglobin A1C No results for input(s): HGBA1C in the last 72 hours. Fasting Lipid Panel Recent Labs    04/07/20 0409  TRIG 83   Thyroid Function Tests No results for input(s): TSH, T4TOTAL, T3FREE, THYROIDAB in the last 72 hours.  Invalid input(s): FREET3  Other results:   Imaging    DG CHEST PORT 1 VIEW  Result Date: 04/09/2020 CLINICAL DATA:  50 year old male COVID-36.  ECMO. EXAM: PORTABLE CHEST 1 VIEW COMPARISON:  Portable chest 04/08/2020 and earlier. FINDINGS: Portable AP semi upright view at 0541 hours. Stable tracheostomy tube, visible enteric feeding tube, left IJ approach central line and ECMO cannula. Since yesterday recurrence of confluent lung opacity, especially in the left upper lung, with more obscured mediastinal contours today. Left perihilar air bronchograms persist. No pneumothorax. Probable small right pleural effusion. Paucity of bowel gas in the upper abdomen. Stable visualized osseous structures. IMPRESSION: 1.  Stable lines and tubes. 2. ARDS with worsening ventilation compared to yesterday morning. Probable small right pleural effusion. Electronically Signed   By: Genevie Ann M.D.   On: 04/09/2020 06:31     Medications:     Scheduled Medications: . sodium chloride   Intravenous Once  . acetaZOLAMIDE  500 mg Oral BID  . bethanechol  10 mg Per Tube TID  .  carvedilol  12.5 mg Oral BID WC  . chlorhexidine gluconate (MEDLINE KIT)  15 mL Mouth Rinse BID  . Chlorhexidine Gluconate Cloth  6 each Topical Daily  . chlorpheniramine-HYDROcodone  5 mL Per Tube Q12H  . clonazePAM  1 mg Per Tube Daily  . clonazePAM  3 mg Per Tube QHS  .  cloNIDine  0.1 mg Per Tube Q8H  . docusate  100 mg Per Tube BID  . fiber  1 packet Per Tube BID  . free water  100 mL Per Tube Q6H  . guaiFENesin-dextromethorphan  10 mL Per Tube BID  . insulin aspart  0-20 Units Subcutaneous Q4H  . insulin aspart  6 Units Subcutaneous Q4H  . insulin detemir  35 Units Subcutaneous BID  . lactobacillus acidophilus  2 tablet Per Tube TID  . liver oil-zinc oxide   Topical 5 X Daily  . mouth rinse  15 mL Mouth Rinse 10 times per day  . melatonin  10 mg Per Tube QHS  . nitroGLYCERIN  0.5 inch Topical Q6H  . nutrition supplement (JUVEN)  1 packet Per Tube BID BM  . nystatin  5 mL Mouth/Throat QID  . oxyCODONE  15 mg Per Tube Q6H  . pantoprazole sodium  40 mg Per Tube QHS  . QUEtiapine  200 mg Per Tube QHS  . sennosides  10 mL Per Tube QHS  . sodium chloride flush  10-40 mL Intracatheter Q12H  . valproic acid  250 mg Per Tube QHS    Infusions: . sodium chloride    . sodium chloride 250 mL (04/09/20 0159)  . sodium chloride 250 mL (04/07/20 2203)  . sodium chloride Stopped (03/12/20 0131)  . albumin human Stopped (03/31/20 1745)  . bivalirudin (ANGIOMAX) infusion 0.5 mg/mL (Non-ACS indications) 0.08 mg/kg/hr (04/09/20 0647)  . feeding supplement (PIVOT 1.5 CAL) 1,000 mL (04/05/20 2355)  . midazolam 1 mg/hr (04/09/20 0647)  . norepinephrine (LEVOPHED) Adult infusion Stopped (04/07/20 2234)  . vancomycin Stopped (04/08/20 1315)    PRN Medications: Place/Maintain arterial line **AND** sodium chloride, sodium chloride, sodium chloride, acetaminophen (TYLENOL) oral liquid 160 mg/5 mL, albumin human, guaiFENesin, haloperidol lactate, hydrALAZINE, labetalol, lidocaine, lip balm,  midazolam, morphine CONCENTRATE, neomycin-bacitracin-polymyxin, ondansetron (ZOFRAN) IV, polyethylene glycol, simethicone, sodium chloride flush   Assessment/Plan   1. Acute hypoxemic respiratory failure: Due to COVID-19 PNA with bilateral infiltrates.  Refractory hypoxemia, VV-ECMO cannulation on 02/16/2020 with improvement in oxygenation.  Developed left PTX post-subclavian CVL and had left chest tube, the left lung is re-expanded and CT out.  He was extubated 1/2 but reintubated 1/4 with agitation and suspected aspiration.  Tracheostomy 1/6.  CTA chest 1/22 with suspected chronic PEs and ARDS. ECMO cannula repositioned 1/5. ECMO circuit changed 1/26. LDH stable today. Sweep at 7.5 with hypercarbia from significant dead space ventilation.  Now on vancomycin for Group F Strep and Enterococcus faecalis in trach aspirate.  I/Os negative.  CXR somewhat worse, ?delayed reaction from circuit change.  PaCO2 and HCO3 improved using high dose acetazolamide.  Doing better on Versed, awake and appropriate.  Cough improved.    - Hold Lasix today, use acetazolamide 500 mg bid.  If we can control volume this way, continue.  If not, consider CVVH to clear HCO3 and volume.   - Slowly wean sweep.  - Continue current sedation with Versed.  - Patient has had remdesivir, tocilizumab. - Completed steroid taper. - Continue bivalirudin, goal PTT 65-80. He is at 43 today.  - Mobilize today with PT, sit him up.  2. 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  Clot noted on TEE in RV as well.  TTE 1/2 showed normal EF 60-65%, RV improved (mildly dilated/dysfunctional). TEE on 1/5 with moderate to severe RV dysfunction but patient was hypoxemic.  Had 1/2 dose TPA on  1/5. Echo 1/20 with mildly dilated/mildly dysfunctional RV. CTA chest 1/22 with chronic-appearing PEs in upper lobes. - Bivalirudin for goal PTT 65-80.  Management as above 3. Left PTX: Left chest tube,  lung is re-expanded. Tube now out, stable CXR. 4. Shock: Suspect septic/distributive.  Now resolved, off NE.  5. Anemia: Hgb 9.9. transfuse < 8.   6. AKI: Resolved.  7. Hyperglycemia: insulin.  8. HTN: BP stable.   - On clonidine.  - Increase Coreg to 12.5 mg bid.   9. CHB: Episode of CHB when hypoxemic and with cough (suspect vagal).  NSR since then.    - Continue Coreg, watch rhythm.  10. Thrombocytopenia: Resolved  11. Ileus: Resolved. TFs restarted.  - Getting Reglan  - Cor-trak repositioned to post-pyloric placement 12. Ischemic digits: LUE.  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  Repeat study 1/17 showed no obstruction.  - Have wound care see again today.  13. ID: Group F Strep in sputum.  Also with Enterococcus faecalis in sputum.  - On vancomycin.  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    CRITICAL CARE Performed by: Loralie Champagne  Total critical care time: 40 minutes  Critical care time was exclusive of separately billable procedures and treating other patients.  Critical care was necessary to treat or prevent imminent or life-threatening deterioration.  Critical care was time spent personally by me on the following activities: development of treatment plan with patient and/or surrogate as well as nursing, discussions with consultants, evaluation of patient's response to treatment, examination of patient, obtaining history from patient or surrogate, ordering and performing treatments and interventions, ordering and review of laboratory studies, ordering and review of radiographic studies, pulse oximetry and re-evaluation of patient's condition.    Length of Stay: [place], MD  04/09/2020, 7:42 AM  Advanced Heart Failure Team Pager [phone] (M-F; 7a - 4p)  Please contact [facility] for night-coverage after hours (4p -7a ) and weekends  on [email or web address]  Pre and post ABG show that we are not effectively clearing CO2.  Discussed with ECMO coordinator, will change out circuit today.   Loralie Champagne 04/09/2020 7:42 AM

## 2020-04-09 NOTE — Consult Note (Signed)
WOC Nurse Consult Note: Patient receiving care in [facility] [phone]. I spoke with primary RN, Diannia Ruder, via telephone about the concerns related to the left hand, in specific, the finger that is black and dry and looks like it will fall off.  I explained that nothing the nurses can do will fix this problem.  Concerns need to be taken to the provider and consider ordering a hand surgeon consult. Helmut Muster, RN, MSN, CWOCN, CNS-BC, pager [phone]

## 2020-04-09 NOTE — Progress Notes (Signed)
ANTICOAGULATION CONSULT NOTE  Pharmacy Consult for bivalirudin Indication: ECMO and VTE  Labs: Recent Labs    04/07/20 0409 04/07/20 0410 04/08/20 0413 04/08/20 0419 04/08/20 1605 04/08/20 1611 04/08/20 2356 04/09/20 0409 04/09/20 0418  HGB 8.7*   < > 9.3*   < > 10.0*   < > 9.9* 9.9* 10.2*  HCT 29.3*   < > 28.9*   < > 31.4*   < > 29.0* 31.1* 30.0*  PLT 257   < > 248  --  294  --   --  309  --   APTT 66*   < > 72*  --  78*  --   --  79*  --   LABPROT 16.5*  --  17.2*  --   --   --   --  17.3*  --   INR 1.4*  --  1.5*  --   --   --   --  1.5*  --   CREATININE 0.83   < > 0.83  --  0.73  --   --  0.82  --    < > = values in this interval not displayed.    Assessment: 64 yoM admitted with COVID-19 PNA with worsening hypoxia, s/p cannulation for ECMO. Pt was started on IV heparin prior to cannulation due to acute DVTs and possible PE, transitioned to bivalirudin with ECMO. Now s/p tPA on 1/5 and tracheostomy on 1/6.   -LDH= 547, fibrinogen= 684 -APTT therapeutic, CBC stable  Goal of Therapy:  aPTT 60-80 seconds   Plan:  Continue bivalirudin at 0.08 mg/kg/hr (using order-specific wt 72.1kg) Monitor q12h aPTT/CBC, LDH, and for s/sx of bleeding  Harland German, PharmD Clinical Pharmacist **Pharmacist phone directory can now be found on [email or web address] ([email or web address]).  Listed under [facility].

## 2020-04-10 ENCOUNTER — Inpatient Hospital Stay (HOSPITAL_COMMUNITY): Payer: Medicaid Other

## 2020-04-10 LAB — TYPE AND SCREEN
ABO/RH(D): B POS
Antibody Screen: NEGATIVE
Unit division: 0
Unit division: 0
Unit division: 0
Unit division: 0
Unit division: 0

## 2020-04-10 LAB — CBC
HCT: 28.9 % — ABNORMAL LOW (ref 39.0–52.0)
HCT: 31.3 % — ABNORMAL LOW (ref 39.0–52.0)
Hemoglobin: 9 g/dL — ABNORMAL LOW (ref 13.0–17.0)
Hemoglobin: 9.2 g/dL — ABNORMAL LOW (ref 13.0–17.0)
MCH: 30.8 pg (ref 26.0–34.0)
MCH: 29.6 pg (ref 26.0–34.0)
MCHC: 31.1 g/dL (ref 30.0–36.0)
MCHC: 29.4 g/dL — ABNORMAL LOW (ref 30.0–36.0)
MCV: 99 fL (ref 80.0–100.0)
MCV: 100.6 fL — ABNORMAL HIGH (ref 80.0–100.0)
Platelets: 309 10*3/uL (ref 150–400)
Platelets: 331 10*3/uL (ref 150–400)
RBC: 2.92 MIL/uL — ABNORMAL LOW (ref 4.22–5.81)
RBC: 3.11 MIL/uL — ABNORMAL LOW (ref 4.22–5.81)
RDW: 17.3 % — ABNORMAL HIGH (ref 11.5–15.5)
RDW: 16.9 % — ABNORMAL HIGH (ref 11.5–15.5)
WBC: 12.1 10*3/uL — ABNORMAL HIGH (ref 4.0–10.5)
WBC: 16.1 10*3/uL — ABNORMAL HIGH (ref 4.0–10.5)
nRBC: 1.2 % — ABNORMAL HIGH (ref 0.0–0.2)
nRBC: 1 % — ABNORMAL HIGH (ref 0.0–0.2)

## 2020-04-10 LAB — POCT I-STAT 7, (LYTES, BLD GAS, ICA,H+H)
Acid-Base Excess: 0 mmol/L (ref 0.0–2.0)
Acid-Base Excess: 1 mmol/L (ref 0.0–2.0)
Acid-Base Excess: 0 mmol/L (ref 0.0–2.0)
Acid-Base Excess: 0 mmol/L (ref 0.0–2.0)
Acid-Base Excess: 0 mmol/L (ref 0.0–2.0)
Acid-base deficit: 1 mmol/L (ref 0.0–2.0)
Acid-base deficit: 1 mmol/L (ref 0.0–2.0)
Bicarbonate: 27.1 mmol/L (ref 20.0–28.0)
Bicarbonate: 27.1 mmol/L (ref 20.0–28.0)
Bicarbonate: 26.8 mmol/L (ref 20.0–28.0)
Bicarbonate: 27.5 mmol/L (ref 20.0–28.0)
Bicarbonate: 26.1 mmol/L (ref 20.0–28.0)
Bicarbonate: 25.2 mmol/L (ref 20.0–28.0)
Bicarbonate: 26.1 mmol/L (ref 20.0–28.0)
Calcium, Ion: 1.34 mmol/L (ref 1.15–1.40)
Calcium, Ion: 1.31 mmol/L (ref 1.15–1.40)
Calcium, Ion: 1.36 mmol/L (ref 1.15–1.40)
Calcium, Ion: 1.41 mmol/L — ABNORMAL HIGH (ref 1.15–1.40)
Calcium, Ion: 1.41 mmol/L — ABNORMAL HIGH (ref 1.15–1.40)
Calcium, Ion: 1.36 mmol/L (ref 1.15–1.40)
Calcium, Ion: 1.38 mmol/L (ref 1.15–1.40)
HCT: 26 % — ABNORMAL LOW (ref 39.0–52.0)
HCT: 26 % — ABNORMAL LOW (ref 39.0–52.0)
HCT: 27 % — ABNORMAL LOW (ref 39.0–52.0)
HCT: 28 % — ABNORMAL LOW (ref 39.0–52.0)
HCT: 26 % — ABNORMAL LOW (ref 39.0–52.0)
HCT: 24 % — ABNORMAL LOW (ref 39.0–52.0)
HCT: 30 % — ABNORMAL LOW (ref 39.0–52.0)
Hemoglobin: 8.8 g/dL — ABNORMAL LOW (ref 13.0–17.0)
Hemoglobin: 8.8 g/dL — ABNORMAL LOW (ref 13.0–17.0)
Hemoglobin: 9.2 g/dL — ABNORMAL LOW (ref 13.0–17.0)
Hemoglobin: 9.5 g/dL — ABNORMAL LOW (ref 13.0–17.0)
Hemoglobin: 8.8 g/dL — ABNORMAL LOW (ref 13.0–17.0)
Hemoglobin: 8.2 g/dL — ABNORMAL LOW (ref 13.0–17.0)
Hemoglobin: 10.2 g/dL — ABNORMAL LOW (ref 13.0–17.0)
O2 Saturation: 94 %
O2 Saturation: 96 %
O2 Saturation: 98 %
O2 Saturation: 94 %
O2 Saturation: 99 %
O2 Saturation: 98 %
O2 Saturation: 97 %
Patient temperature: 36.9
Patient temperature: 36.9
Patient temperature: 37
Patient temperature: 37
Patient temperature: 36.8
Patient temperature: 36.9
Patient temperature: 36.9
Potassium: 3.7 mmol/L (ref 3.5–5.1)
Potassium: 3.7 mmol/L (ref 3.5–5.1)
Potassium: 4.8 mmol/L (ref 3.5–5.1)
Potassium: 4.5 mmol/L (ref 3.5–5.1)
Potassium: 3.8 mmol/L (ref 3.5–5.1)
Potassium: 3.8 mmol/L (ref 3.5–5.1)
Potassium: 4.4 mmol/L (ref 3.5–5.1)
Sodium: 152 mmol/L — ABNORMAL HIGH (ref 135–145)
Sodium: 151 mmol/L — ABNORMAL HIGH (ref 135–145)
Sodium: 152 mmol/L — ABNORMAL HIGH (ref 135–145)
Sodium: 154 mmol/L — ABNORMAL HIGH (ref 135–145)
Sodium: 152 mmol/L — ABNORMAL HIGH (ref 135–145)
Sodium: 152 mmol/L — ABNORMAL HIGH (ref 135–145)
Sodium: 151 mmol/L — ABNORMAL HIGH (ref 135–145)
TCO2: 29 mmol/L (ref 22–32)
TCO2: 29 mmol/L (ref 22–32)
TCO2: 28 mmol/L (ref 22–32)
TCO2: 29 mmol/L (ref 22–32)
TCO2: 28 mmol/L (ref 22–32)
TCO2: 27 mmol/L (ref 22–32)
TCO2: 28 mmol/L (ref 22–32)
pCO2 arterial: 53.7 mmHg — ABNORMAL HIGH (ref 32.0–48.0)
pCO2 arterial: 50.5 mmHg — ABNORMAL HIGH (ref 32.0–48.0)
pCO2 arterial: 53.7 mmHg — ABNORMAL HIGH (ref 32.0–48.0)
pCO2 arterial: 63 mmHg — ABNORMAL HIGH (ref 32.0–48.0)
pCO2 arterial: 50.3 mmHg — ABNORMAL HIGH (ref 32.0–48.0)
pCO2 arterial: 50.9 mmHg — ABNORMAL HIGH (ref 32.0–48.0)
pCO2 arterial: 55.6 mmHg — ABNORMAL HIGH (ref 32.0–48.0)
pH, Arterial: 7.311 — ABNORMAL LOW (ref 7.350–7.450)
pH, Arterial: 7.337 — ABNORMAL LOW (ref 7.350–7.450)
pH, Arterial: 7.307 — ABNORMAL LOW (ref 7.350–7.450)
pH, Arterial: 7.248 — ABNORMAL LOW (ref 7.350–7.450)
pH, Arterial: 7.323 — ABNORMAL LOW (ref 7.350–7.450)
pH, Arterial: 7.302 — ABNORMAL LOW (ref 7.350–7.450)
pH, Arterial: 7.28 — ABNORMAL LOW (ref 7.350–7.450)
pO2, Arterial: 79 mmHg — ABNORMAL LOW (ref 83.0–108.0)
pO2, Arterial: 92 mmHg (ref 83.0–108.0)
pO2, Arterial: 111 mmHg — ABNORMAL HIGH (ref 83.0–108.0)
pO2, Arterial: 83 mmHg (ref 83.0–108.0)
pO2, Arterial: 135 mmHg — ABNORMAL HIGH (ref 83.0–108.0)
pO2, Arterial: 111 mmHg — ABNORMAL HIGH (ref 83.0–108.0)
pO2, Arterial: 101 mmHg (ref 83.0–108.0)

## 2020-04-10 LAB — GLUCOSE, CAPILLARY
Glucose-Capillary: 221 mg/dL — ABNORMAL HIGH (ref 70–99)
Glucose-Capillary: 212 mg/dL — ABNORMAL HIGH (ref 70–99)
Glucose-Capillary: 150 mg/dL — ABNORMAL HIGH (ref 70–99)
Glucose-Capillary: 51 mg/dL — ABNORMAL LOW (ref 70–99)
Glucose-Capillary: 167 mg/dL — ABNORMAL HIGH (ref 70–99)
Glucose-Capillary: 192 mg/dL — ABNORMAL HIGH (ref 70–99)

## 2020-04-10 LAB — BASIC METABOLIC PANEL
Anion gap: 7 (ref 5–15)
Anion gap: 9 (ref 5–15)
BUN: 52 mg/dL — ABNORMAL HIGH (ref 6–20)
BUN: 48 mg/dL — ABNORMAL HIGH (ref 6–20)
CO2: 26 mmol/L (ref 22–32)
CO2: 25 mmol/L (ref 22–32)
Calcium: 9.1 mg/dL (ref 8.9–10.3)
Calcium: 9.4 mg/dL (ref 8.9–10.3)
Chloride: 116 mmol/L — ABNORMAL HIGH (ref 98–111)
Chloride: 118 mmol/L — ABNORMAL HIGH (ref 98–111)
Creatinine, Ser: 0.71 mg/dL (ref 0.61–1.24)
Creatinine, Ser: 0.67 mg/dL (ref 0.61–1.24)
GFR, Estimated: >60 mL/min (ref >60)
GFR, Estimated: >60 mL/min (ref >60)
Glucose, Bld: 253 mg/dL — ABNORMAL HIGH (ref 70–99)
Glucose, Bld: 108 mg/dL — ABNORMAL HIGH (ref 70–99)
Potassium: 3.7 mmol/L (ref 3.5–5.1)
Potassium: 3.9 mmol/L (ref 3.5–5.1)
Sodium: 149 mmol/L — ABNORMAL HIGH (ref 135–145)
Sodium: 152 mmol/L — ABNORMAL HIGH (ref 135–145)

## 2020-04-10 LAB — APTT
aPTT: 79 seconds — ABNORMAL HIGH (ref 24–36)
aPTT: 80 seconds — ABNORMAL HIGH (ref 24–36)

## 2020-04-10 LAB — BPAM RBC
Blood Product Expiration Date: 202202092359
Blood Product Expiration Date: 202202092359
Blood Product Expiration Date: 202202162359
Blood Product Expiration Date: 202202172359
Blood Product Expiration Date: 202202192359
ISSUE DATE / TIME: 202201260933
ISSUE DATE / TIME: 202201260933
ISSUE DATE / TIME: 202201262137
Unit Type and Rh: 7300
Unit Type and Rh: 7300
Unit Type and Rh: 7300
Unit Type and Rh: 7300
Unit Type and Rh: 7300

## 2020-04-10 LAB — PROTIME-INR
INR: 1.5 — ABNORMAL HIGH (ref 0.8–1.2)
Prothrombin Time: 17.5 seconds — ABNORMAL HIGH (ref 11.4–15.2)

## 2020-04-10 LAB — FIBRINOGEN: Fibrinogen: 601 mg/dL — ABNORMAL HIGH (ref 210–475)

## 2020-04-10 LAB — LACTATE DEHYDROGENASE: LDH: 461 U/L — ABNORMAL HIGH (ref 98–192)

## 2020-04-10 LAB — LACTIC ACID, PLASMA
Lactic Acid, Venous: 0.7 mmol/L (ref 0.5–1.9)
Lactic Acid, Venous: 0.8 mmol/L (ref 0.5–1.9)

## 2020-04-10 MED ORDER — BUTAMBEN-TETRACAINE-BENZOCAINE 2-2-14 % EX AERO
1.0000 | INHALATION_SPRAY | Freq: Four times a day (QID) | CUTANEOUS | Status: DC | PRN
  Administered 2020-04-10: 1 via TOPICAL
  Filled 2020-04-10: qty 20

## 2020-04-10 MED ORDER — GABAPENTIN 250 MG/5ML PO SOLN
300.0000 mg | Freq: Three times a day (TID) | ORAL | Status: DC
  Administered 2020-04-10 – 2020-04-12 (×7): 300 mg
  Filled 2020-04-10 (×10): qty 6

## 2020-04-10 MED ORDER — POTASSIUM CHLORIDE 20 MEQ PO PACK
40.0000 meq | PACK | Freq: Once | ORAL | Status: AC
  Administered 2020-04-10: 40 meq
  Filled 2020-04-10: qty 2

## 2020-04-10 MED ORDER — ALBUTEROL SULFATE (2.5 MG/3ML) 0.083% IN NEBU
2.5000 mg | INHALATION_SOLUTION | Freq: Four times a day (QID) | RESPIRATORY_TRACT | Status: DC | PRN
  Administered 2020-04-11: 2.5 mg via RESPIRATORY_TRACT
  Filled 2020-04-10: qty 3

## 2020-04-10 MED ORDER — BUDESONIDE 0.5 MG/2ML IN SUSP
0.5000 mg | Freq: Two times a day (BID) | RESPIRATORY_TRACT | Status: DC
  Administered 2020-04-10 – 2020-04-12 (×6): 0.5 mg via RESPIRATORY_TRACT
  Filled 2020-04-10 (×6): qty 2

## 2020-04-10 MED ORDER — INSULIN DETEMIR 100 UNIT/ML ~~LOC~~ SOLN
30.0000 [IU] | Freq: Two times a day (BID) | SUBCUTANEOUS | Status: DC
  Administered 2020-04-10: 30 [IU] via SUBCUTANEOUS
  Filled 2020-04-10 (×3): qty 0.3

## 2020-04-10 MED ORDER — BUTAMBEN-TETRACAINE-BENZOCAINE 2-2-14 % EX AERO
1.0000 | INHALATION_SPRAY | CUTANEOUS | Status: DC | PRN
  Administered 2020-04-10 – 2020-04-13 (×13): 1 via TOPICAL
  Filled 2020-04-10: qty 20

## 2020-04-10 MED ORDER — DEXTROMETHORPHAN POLISTIREX ER 30 MG/5ML PO SUER
30.0000 mg | Freq: Three times a day (TID) | ORAL | Status: DC
  Administered 2020-04-10 – 2020-05-05 (×78): 30 mg
  Filled 2020-04-10 (×80): qty 5

## 2020-04-10 MED ORDER — INSULIN DETEMIR 100 UNIT/ML ~~LOC~~ SOLN
40.0000 [IU] | Freq: Two times a day (BID) | SUBCUTANEOUS | Status: DC
  Filled 2020-04-10: qty 0.4

## 2020-04-10 MED ORDER — FREE WATER
200.0000 mL | Freq: Four times a day (QID) | Status: DC
  Administered 2020-04-10 – 2020-04-13 (×11): 200 mL

## 2020-04-10 MED ORDER — CHLORHEXIDINE GLUCONATE 0.12 % MT SOLN
OROMUCOSAL | Status: AC
  Administered 2020-04-10: 15 mL via OROMUCOSAL
  Filled 2020-04-10: qty 15

## 2020-04-10 MED ORDER — LIDOCAINE HCL 1 % IJ SOLN
10.0000 mL | Freq: Four times a day (QID) | INTRAMUSCULAR | Status: DC | PRN
  Filled 2020-04-10: qty 10

## 2020-04-10 MED ORDER — DEXTROSE 50 % IV SOLN
25.0000 g | INTRAVENOUS | Status: AC
  Administered 2020-04-10: 25 g via INTRAVENOUS
  Filled 2020-04-10: qty 50

## 2020-04-10 NOTE — Procedures (Signed)
Extracorporeal support note  ECLS cannulation date: 03/02/2020 Support day 29 Last circuit change: 04/07/2020  Indication: Acute hypoxic respiratory failure due to ARDS from COVID-19 pneumonia with RV dysfunction.    Configuration: Venovenous  Drainage cannula: 32 French crescent cannula via right IJ Return cannula: Same  Pump speed: 3360 RPM Pump flow: 4.4 L/min Pump used: Cardio help  Oxygenator: Cardio help O2 blender: 100% Sweep gas: 6L  Circuit check: No clots Anticoagulant: Bivalirudin Anticoagulation targets: PTT 60-80  Changes in support:  Cont vent settings with peep at 10 when dropped to 5 increased ards on imaging. Circuit running well. Slowly but surely weaning sweep. Adding inhaled steroid and beta agonist for persistent cough along with attempt at topical cetacaine rather than nebulized lidocaine  Anticipated goals/duration of support: Bridge to recovery.  Multidisciplinary ECMO rounds completed.   Critical care time: The patient is critically ill with multiple organ systems failure and requires high complexity decision making for assessment and support, frequent evaluation and titration of therapies, application of advanced monitoring technologies and extensive interpretation of multiple databases.  Critical care time 37 mins. This represents my time independent of the NPs time taking care of the pt. This is excluding procedures.    Briant Sites DO [facility] 04/10/2020, 10:30 AM

## 2020-04-10 NOTE — Progress Notes (Signed)
ANTICOAGULATION CONSULT NOTE  Pharmacy Consult for bivalirudin Indication: ECMO and VTE  Labs: Recent Labs    04/08/20 0413 04/08/20 0419 04/09/20 0409 04/09/20 0418 04/09/20 1603 04/09/20 1606 04/10/20 0355 04/10/20 0357 04/10/20 1214 04/10/20 1358 04/10/20 1637  HGB 9.3*   < > 9.9*   < > 9.2*   < > 9.0*   < > 9.5* 8.8* 9.2*  HCT 28.9*   < > 31.1*   < > 31.9*   < > 28.9*   < > 28.0* 26.0* 31.3*  PLT 248   < > 309  --  318  --  309  --   --   --  331  APTT 72*   < > 79*  --  79*  --  79*  --   --   --  80*  LABPROT 17.2*  --  17.3*  --   --   --  17.5*  --   --   --   --   INR 1.5*  --  1.5*  --   --   --  1.5*  --   --   --   --   CREATININE 0.83   < > 0.82  --  0.76  --  0.71  --   --   --  0.67   < > = values in this interval not displayed.    Assessment: 46 yoM admitted with COVID-19 PNA with worsening hypoxia, s/p cannulation for ECMO. Pt was started on IV heparin prior to cannulation due to acute DVTs and possible PE, transitioned to bivalirudin with ECMO. Now s/p tPA on 1/5 and tracheostomy on 1/6. Circuit last changed 1/26.  APTT remains therapeutic and stable at upper end of goal. No bleeding or circuit issues per discussion with nursing.  Goal of Therapy:  aPTT 60-80 seconds   Plan:  Continue bivalirudin at 0.08 mg/kg/hr (using order-specific wt 72.1kg) Monitor q12h aPTT/CBC, LDH, and for s/sx of bleeding  Tama Headings, PharmD, BCPS PGY2 Cardiology Pharmacy Resident Phone: [phone] 04/10/2020  5:43 PM  Please check [email or web address] for unit-specific pharmacy phone numbers.

## 2020-04-10 NOTE — Progress Notes (Addendum)
Patient ID: Alex [name], male   DOB: [birth year], 50 y.o.   MRN: [id number] Patient ID: Alex [name], male   DOB: [birth year], 50 y.o.   MRN: [id number]     Advanced Heart Failure Rounding Note  PCP-Cardiologist: No primary care provider on file.   Subjective:    - 12/30: VV ECMO cannulation - 12/31: Left chest tube replaced - 1/2: Extubated. Echo with EF 60-65%, mildly dilated RV with mildly decreased systolic function.  - 1/4: Agitated, suspected aspiration.  Re-intubated.  - 1/5: ECMO cannula repositioned under TEE guidance. TEE showed moderately dilated/moderate-severely dysfunctional RV in setting of hypoxemia. LUE DVT found.  Patient got 1/2 dose of TPA due to initial concern for large PE.  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  - 1/6: Tracheostomy - 1/7: Echo with mild RV dilation/mild RV dysfunction.  - 1/16: Left chest tube out - 1/17: LUE arterial dopplers repeated, showed no obstruction.  - 1/20: Echo with EF 65-70%, mildly D-shaped septum, mildly dilated and mildly dysfunctional RV.  - 1/22: CTA chest: Bilateral upper lobe PEs (suspect chronic), changes c/w ARDS - 1/23: Bronchoscopy showed semi-occlusive ?mass/polyp in the trachea.  - 1/24: Bronchoscopy showed resolution of 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 - 1/26: ECMO circuit changed.   Awake and following commands. On versed at 1. Says there is a tickle in his throat and coughs frequently and alarms on ecmo.   CXR much better.Tidal volumes stable at 300. FiO2 down to 40%  Vancomycin for Group F Strep and Enterococcus faecalis in trach aspirate.   Working with PT.   ECMO parameters: 3360 rpm Flow 4.40 L/min Pvenous -77 Delta P 28 Sweep 6 FiO2 0.5 -> 0.4 TV close to 300 cc  ABG 7.30/54/111/98% LDH  1,117 => 911 => 751 => 699 => 691 => 534 => 488 => 547 => 461 PTT 79 Lactate 0.7  Objective:   Weight Range: 65.7 kg Body mass index is 24.86 kg/m.   Vital Signs:   Temp:  [98.2 F (36.8 C)-98.6  F (37 C)] 98.4 F (36.9 C) (01/29 0400) Pulse Rate:  [98-122] 109 (01/29 0600) Resp:  [0-41] 22 (01/29 0600) BP: (85-177)/(55-109) 117/81 (01/29 0600) SpO2:  [91 %-100 %] 100 % (01/29 0600) Arterial Line BP: (90-237)/(45-98) 141/60 (01/29 0600) FiO2 (%):  [40 %] 40 % (01/29 0536) Weight:  [65.7 kg] 65.7 kg (01/29 0406) Last BM Date: 04/09/20  Weight change: Filed Weights   04/08/20 0458 04/09/20 0424 04/10/20 0406  Weight: 68.1 kg 66.7 kg 65.7 kg    Intake/Output:   Intake/Output Summary (Last 24 hours) at 04/10/2020 0749 Last data filed at 04/10/2020 0600 Gross per 24 hour  Intake 3791.05 ml  Output 2650 ml  Net 1141.05 ml      Physical Exam    General:  Awake on trach. Coughing incessantly HEENT: normal Neck: supple. + trach and ECMO cannula   Cor: PMI nondisplaced. Regular  Tachy  No rubs, gallops or murmurs. Lungs: clear Abdomen: soft, nontender, nondistended. No hepatosplenomegaly. No bruits or masses. Good bowel sounds. Extremities: no cyanosis, clubbing, rash, edema Left hand dry gangrene fingertips.   Neuro: alert & orientedx3, cranial nerves grossly intact. moves all 4 extremities w/o difficulty. Affect pleasant  Telemetry   NSR 100-110s Personally reviewed   Labs    CBC Recent Labs    04/09/20 1603 04/09/20 1606 04/10/20 0355 04/10/20 0357 04/10/20 0605  WBC 16.1*  --  12.1*  --   --   HGB  9.2*   < > 9.0* 8.8* 9.2*  HCT 31.9*   < > 28.9* 26.0* 27.0*  MCV 101.3*  --  99.0  --   --   PLT 318  --  309  --   --    < > = values in this interval not displayed.   Basic Metabolic Panel Recent Labs    04/08/20 0413 04/08/20 0419 04/09/20 1603 04/09/20 1606 04/10/20 0355 04/10/20 0357 04/10/20 0605  NA 146*   < > 151*   < > 149* 151* 152*  K 3.8   < > 3.8   < > 3.7 3.7 4.8  CL 103   < > 114*  --  116*  --   --   CO2 34*   < > 28  --  26  --   --   GLUCOSE 195*   < > 205*  --  253*  --   --   BUN 54*   < > 59*  --  52*  --   --   CREATININE  0.83   < > 0.76  --  0.71  --   --   CALCIUM 8.5*   < > 9.2  --  9.1  --   --   MG 2.7*  --   --   --   --   --   --    < > = values in this interval not displayed.   Liver Function Tests Recent Labs    04/08/20 0413  AST 27  ALT 44  ALKPHOS 226*  BILITOT 0.9  PROT 5.5*  ALBUMIN 2.6*   No results for input(s): LIPASE, AMYLASE in the last 72 hours. Cardiac Enzymes No results for input(s): CKTOTAL, CKMB, CKMBINDEX, TROPONINI in the last 72 hours.  BNP: BNP (last 3 results) No results for input(s): BNP in the last 8760 hours.  ProBNP (last 3 results) No results for input(s): PROBNP in the last 8760 hours.   D-Dimer No results for input(s): DDIMER in the last 72 hours. Hemoglobin A1C No results for input(s): HGBA1C in the last 72 hours. Fasting Lipid Panel No results for input(s): CHOL, HDL, LDLCALC, TRIG, CHOLHDL, LDLDIRECT in the last 72 hours. Thyroid Function Tests No results for input(s): TSH, T4TOTAL, T3FREE, THYROIDAB in the last 72 hours.  Invalid input(s): FREET3  Other results:   Imaging    No results found.   Medications:     Scheduled Medications: . sodium chloride   Intravenous Once  . acetaZOLAMIDE  500 mg Oral BID  . bethanechol  10 mg Per Tube TID  . carvedilol  12.5 mg Oral BID WC  . chlorhexidine gluconate (MEDLINE KIT)  15 mL Mouth Rinse BID  . Chlorhexidine Gluconate Cloth  6 each Topical Daily  . chlorpheniramine-HYDROcodone  5 mL Per Tube Q12H  . clonazePAM  1 mg Per Tube Daily  . clonazePAM  3 mg Per Tube QHS  . cloNIDine  0.1 mg Per Tube Q8H  . dextromethorphan  30 mg Per Tube BID  . docusate  100 mg Per Tube BID  . fiber  1 packet Per Tube BID  . free water  100 mL Per Tube Q6H  . insulin aspart  0-20 Units Subcutaneous Q4H  . insulin aspart  6 Units Subcutaneous Q4H  . insulin detemir  35 Units Subcutaneous BID  . lactobacillus acidophilus  2 tablet Per Tube TID  . liver oil-zinc oxide   Topical 5 X Daily  .  mouth rinse  15  mL Mouth Rinse 10 times per day  . melatonin  10 mg Per Tube QHS  . nitroGLYCERIN  0.5 inch Topical Q6H  . nutrition supplement (JUVEN)  1 packet Per Tube BID BM  . nystatin  5 mL Mouth/Throat QID  . oxyCODONE  15 mg Per Tube Q6H  . pantoprazole sodium  40 mg Per Tube QHS  . QUEtiapine  200 mg Per Tube QHS  . sennosides  10 mL Per Tube QHS  . sodium chloride flush  10-40 mL Intracatheter Q12H  . valproic acid  250 mg Per Tube QHS    Infusions: . sodium chloride    . sodium chloride Stopped (04/09/20 1901)  . sodium chloride 250 mL (04/07/20 2203)  . sodium chloride Stopped (03/12/20 0131)  . albumin human Stopped (03/31/20 1745)  . bivalirudin (ANGIOMAX) infusion 0.5 mg/mL (Non-ACS indications) 0.08 mg/kg/hr (04/10/20 0600)  . feeding supplement (PIVOT 1.5 CAL) 1,000 mL (04/05/20 2355)  . midazolam 1 mg/hr (04/10/20 0724)  . norepinephrine (LEVOPHED) Adult infusion Stopped (04/10/20 0132)  . vancomycin Stopped (04/09/20 1323)    PRN Medications: Place/Maintain arterial line **AND** sodium chloride, sodium chloride, sodium chloride, acetaminophen (TYLENOL) oral liquid 160 mg/5 mL, albumin human, guaiFENesin, haloperidol lactate, hydrALAZINE, labetalol, lidocaine, lip balm, midazolam, morphine CONCENTRATE, neomycin-bacitracin-polymyxin, ondansetron (ZOFRAN) IV, phenol, polyethylene glycol, simethicone, sodium chloride flush   Assessment/Plan   1. Acute hypoxemic respiratory failure: Due to COVID-19 PNA with bilateral infiltrates.  Refractory hypoxemia, VV-ECMO cannulation on 02/26/2020 with improvement in oxygenation.  Developed left PTX post-subclavian CVL and had left chest tube, the left lung is re-expanded and CT out.  He was extubated 1/2 but reintubated 1/4 with agitation and suspected aspiration.  Tracheostomy 1/6.  CTA chest 1/22 with suspected chronic PEs and ARDS. ECMO cannula repositioned 1/5. ECMO circuit changed 1/26. LDH stable today. Sweep at 7.5 with hypercarbia from  significant dead space ventilation.  Now on vancomycin for Group F Strep and Enterococcus faecalis in trach aspirate. Vent settings improved.  Sweep down to 6. CXR improved. ABG looks better. Weight down another 3 pounds. Continue acetazolamide.  - Slowly wean sweep.  - Continue current sedation with Versed.  - Patient has had remdesivir, tocilizumab. - Completed steroid taper. - Continue bivalirudin, goal PTT 65-80. He is at 70 today.  - Continue to mobilize  - Increase Delsym for cough. Will try cetacaine spray through trach. I spoke with Dr. Benjamine Mola (ENT) - suggested gabapentin for cough. Will add 2. 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  Clot noted on TEE in RV as well.  TTE 1/2 showed normal EF 60-65%, RV improved (mildly dilated/dysfunctional). TEE on 1/5 with moderate to severe RV dysfunction but patient was hypoxemic.  Had 1/2 dose TPA on 1/5. Echo 1/20 with mildly dilated/mildly dysfunctional RV. CTA chest 1/22 with chronic-appearing PEs in upper lobes. - Bivalirudin for goal PTT 65-80.  Management as above 3. Left PTX: Left chest tube, lung is re-expanded. Tube now out, stable CXR. 4. Shock: Suspect septic/distributive.  Now resolved, off NE.  5. Anemia: Hgb 9.0. transfuse < 8.   6. AKI: Resolved.  7. Hyperglycemia: insulin.  8. HTN: BP stable.   - On clonidine.  - Continue Coreg to 12.5 mg bid.   9. CHB: Episode of CHB when hypoxemic and with cough (suspect vagal).  NSR since then.    - Continue Coreg, watch rhythm.  10. Thrombocytopenia: Resolved  11.  Ileus: Resolved. TFs restarted.  - Getting Reglan  - Cor-trak repositioned to post-pyloric placement 12. Ischemic digits: LUE.  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  Repeat study 1/17 showed no obstruction.  - Wound Care following 13. ID: Group F Strep in sputum.  Also with Enterococcus faecalis in sputum.  - On vancomycin.  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   15. Hypernatremia: Increase FW  CRITICAL CARE Performed by: Glori Bickers  Total critical care time: 35 minutes  Critical care time was exclusive of separately billable procedures and treating other patients.  Critical care was necessary to treat or prevent imminent or life-threatening deterioration.  Critical care was time spent personally by me on the following activities: development of treatment plan with patient and/or surrogate as well as nursing, discussions with consultants, evaluation of patient's response to treatment, examination of patient, obtaining history from patient or surrogate, ordering and performing treatments and interventions, ordering and review of laboratory studies, ordering and review of radiographic studies, pulse oximetry and re-evaluation of patient's condition.    Length of Stay: [place], MD  04/10/2020, 7:49 AM  Advanced Heart Failure Team Pager [phone] (M-F; 7a - 4p)  Please contact [facility] for night-coverage after hours (4p -7a ) and weekends on [email or web address]

## 2020-04-10 NOTE — Progress Notes (Signed)
ECMO PROGRESS NOTE  NAME:  Alex [name], MRN:  [id number], DOB:  30-Jun-1970, LOS: 32 ADMISSION DATE:  02/25/2020, CONSULTATION DATE: 03/01/2020 REFERRING MD: Wynona Neat -LBPCCM, CHIEF COMPLAINT: Respiratory failure requiring ECMO  HPI/course in hospital  50 year old man admitted to hospital 12/28 with 1 week history of dyspnea cough nausea and vomiting.  Initially admitted to [facility] long hospital and placed on high flow nasal cannula but rapidly failed and required intubation 12/29.  Persistent hypoxic respiratory failure with PF ratio 55 in spite of 18 of PEEP FiO2 0.1 despite paralytics.  Did not improve with prone ventilation  Cannulated for VV ECMO 12/30 via right IJ crescent cannula.  ECMO circuit was changed on 04/07/2020 Iatrogenic pneumothorax from left subclavian triple-lumen placement  Past Medical History  none  Interim history/subjective:  1/29: numbers improving. On sweep 6. Awake. Cough improved with cetacaine. Cont other measures for now.   1/28:off lasix, bicarb improved somewhat iwith increased acetazolamide. On sweep 7.5. awake and following commands. Worsening and persistent cough.   1/27:Patient is diuresing well on Lasix infusion, remains net even ECMO circuit exchange was done without complications yesterday  We will decrease sedation and try to wake him up  Overnight patient became hypotensive, hemoglobin dropped to 7.9, requiring 1 unit PRBC transfusion Objective   Blood pressure 119/81, pulse (!) 110, temperature 98.4 F (36.9 C), temperature source Core, resp. rate (!) 24, height 5\' 4"  (1.626 m), weight 65.7 kg, SpO2 100 %.    Vent Mode: PCV FiO2 (%):  [40 %] 40 % Set Rate:  [20 bmp] 20 bmp PEEP:  [10 cmH20] 10 cmH20 Plateau Pressure:  [16 cmH20-29 cmH20] 16 cmH20   Intake/Output Summary (Last 24 hours) at 04/10/2020 1043 Last data filed at 04/10/2020 1000 Gross per 24 hour  Intake 3804.98 ml  Output 2385 ml  Net 1419.98 ml   Filed Weights    04/08/20 0458 04/09/20 0424 04/10/20 0406  Weight: 68.1 kg 66.7 kg 65.7 kg    Examination:   Physical exam: General: ill-appearing male, s/p trach HEENT: [place]/AT, eyes anicteric.  Moist mucous membranes.  ECMO cannulation on right side of the neck Neuro: Opens eyes with vocal stimuli, following basic commands, moving all 4 extremities spontaneously.  Pupils 3 mm bilateral reactive to light Chest:no wheeze, greatly diminished bilaterally  Heart: Regular rate and rhythm, no murmurs or gallops Abdomen: Soft, nontender, mildly distended, bowel sounds present Skin: Ischemic left hand  Assessment & Plan:  Acute hypoxemic/hypercapnic respiratory failure due to severe ARDS from COVID-19 pneumonia Probable acute PE, and RV dysfunction s/p TPA Refractory coughing Strep group F/enterococcal pneumonia -Continue VV ECMO support  -improved sweep on ECMO is 6 with 100% FiO2 -Continue ultra lung-protective ventilation  - follow for spontaneous Vt recovery -Continue bivalirudin with PTT goal 60-80 -Try to avoid sedation with RASS goal 0/-1 -Continue IV vancomycin to complete 10 days therapy - dextromethorphan with benefit so increase dosing -adding cetacaine topical for coughing as well, ENT recommended gabapentin which we will start. Monitoring mental status.  -add pulmicort -cxr improved HTN - labile and possibly circuit related.  -contcoreg  Acute delirium, with agitation -remains on versed but alert and following commands -also numerous oral/prn iv agents  Gastroparesis with some element ileus Ischemic changes L hand -wound following  Poorly controlled diabetes type II -insulin -increase basal slightly to 40U BID  Acute urinary retention Foley bethanechol      Daily Goals Checklist  Pain/Anxiety/Delirium protocol (if indicated): on versed infusion  now. Off other continuous VAP protocol (if indicated): bundle in place.  DVT prophylaxis: bivalirudin GI prophylaxis:  pantoprazole Glucose control: Euglcyemic on combination of SSI and Basal insulin. Mobility/therapy needs: Mobilization as tolerated, pt is more deconditioned than previous Code Status: Full code Disposition: ICU.    Critical care time: The patient is critically ill with multiple organ systems failure and requires high complexity decision making for assessment and support, frequent evaluation and titration of therapies, application of advanced monitoring technologies and extensive interpretation of multiple databases.  Critical care time 40 mins. This represents my time independent of the NPs time taking care of the pt. This is excluding procedures.    Briant Sites DO [facility] 04/10/2020, 10:43 AM

## 2020-04-10 NOTE — Plan of Care (Signed)
  Problem: Coping: Goal: Psychosocial and spiritual needs will be supported Outcome: Progressing   Problem: Role Relationship: Goal: Method of communication will improve Outcome: Progressing Note: Pt more awake and interactive throughout day   Problem: Clinical Measurements: Goal: Respiratory complications will improve Outcome: Progressing Note: Nebulizer's and cetacaine spray added with improvement Goal: Cardiovascular complication will be avoided Outcome: Progressing   Problem: Activity: Goal: Risk for activity intolerance will decrease Outcome: Progressing Note: Stood in bed x30 mins; participated in exercises while standing   Problem: Coping: Goal: Level of anxiety will decrease Outcome: Progressing   Problem: Elimination: Goal: Will not experience complications related to bowel motility Outcome: Progressing

## 2020-04-10 NOTE — Progress Notes (Signed)
Hypoglycemic Event  CBG: 51  Treatment: D50 50 mL (25 gm)  Symptoms: None  Follow-up CBG: Time: 1548 CBG Result:167  Possible Reasons for Event: Other: tube feedings on hold d/t vomiting  Comments/MD notified:Marshall, Shanda Bumps, DO    Allene Pyo

## 2020-04-10 NOTE — Progress Notes (Signed)
ANTICOAGULATION CONSULT NOTE  Pharmacy Consult for bivalirudin Indication: ECMO and VTE  Labs: Recent Labs    04/08/20 0413 04/08/20 0419 04/09/20 0409 04/09/20 0418 04/09/20 1603 04/09/20 1606 04/10/20 0355 04/10/20 0357 04/10/20 0605  HGB 9.3*   < > 9.9*   < > 9.2*   < > 9.0* 8.8* 9.2*  HCT 28.9*   < > 31.1*   < > 31.9*   < > 28.9* 26.0* 27.0*  PLT 248   < > 309  --  318  --  309  --   --   APTT 72*   < > 79*  --  79*  --  79*  --   --   LABPROT 17.2*  --  17.3*  --   --   --  17.5*  --   --   INR 1.5*  --  1.5*  --   --   --  1.5*  --   --   CREATININE 0.83   < > 0.82  --  0.76  --  0.71  --   --    < > = values in this interval not displayed.    Assessment: 44 yoM admitted with COVID-19 PNA with worsening hypoxia, s/p cannulation for ECMO. Pt was started on IV heparin prior to cannulation due to acute DVTs and possible PE, transitioned to bivalirudin with ECMO. Now s/p tPA on 1/5 and tracheostomy on 1/6. Circuit last changed 1/26.  APTT remains therapeutic and stable; no bleeding or circuit issues per discussion with nursing.  Goal of Therapy:  aPTT 60-80 seconds   Plan:  Continue bivalirudin at 0.08 mg/kg/hr (using order-specific wt 72.1kg) Monitor q12h aPTT/CBC, LDH, and for s/sx of bleeding  Tama Headings, PharmD, BCPS PGY2 Cardiology Pharmacy Resident Phone: [phone] 04/10/2020  7:33 AM  Please check [email or web address] for unit-specific pharmacy phone numbers.

## 2020-04-10 NOTE — Progress Notes (Signed)
Blue port unable to flush or draw back blood. Cap changed with no access. Primary RN aware and will discuss alternative lines with the MD

## 2020-04-11 ENCOUNTER — Inpatient Hospital Stay (HOSPITAL_COMMUNITY): Payer: Medicaid Other

## 2020-04-11 ENCOUNTER — Inpatient Hospital Stay: Payer: Self-pay

## 2020-04-11 LAB — POCT I-STAT 7, (LYTES, BLD GAS, ICA,H+H)
Acid-Base Excess: 1 mmol/L (ref 0.0–2.0)
Acid-Base Excess: 0 mmol/L (ref 0.0–2.0)
Acid-base deficit: 1 mmol/L (ref 0.0–2.0)
Acid-base deficit: 1 mmol/L (ref 0.0–2.0)
Acid-base deficit: 1 mmol/L (ref 0.0–2.0)
Acid-base deficit: 1 mmol/L (ref 0.0–2.0)
Acid-base deficit: 1 mmol/L (ref 0.0–2.0)
Bicarbonate: 24.8 mmol/L (ref 20.0–28.0)
Bicarbonate: 25.4 mmol/L (ref 20.0–28.0)
Bicarbonate: 25.6 mmol/L (ref 20.0–28.0)
Bicarbonate: 25.8 mmol/L (ref 20.0–28.0)
Bicarbonate: 25.9 mmol/L (ref 20.0–28.0)
Bicarbonate: 26 mmol/L (ref 20.0–28.0)
Bicarbonate: 26.6 mmol/L (ref 20.0–28.0)
Calcium, Ion: 1.34 mmol/L (ref 1.15–1.40)
Calcium, Ion: 1.34 mmol/L (ref 1.15–1.40)
Calcium, Ion: 1.35 mmol/L (ref 1.15–1.40)
Calcium, Ion: 1.36 mmol/L (ref 1.15–1.40)
Calcium, Ion: 1.37 mmol/L (ref 1.15–1.40)
Calcium, Ion: 1.38 mmol/L (ref 1.15–1.40)
Calcium, Ion: 1.38 mmol/L (ref 1.15–1.40)
HCT: 22 % — ABNORMAL LOW (ref 39.0–52.0)
HCT: 24 % — ABNORMAL LOW (ref 39.0–52.0)
HCT: 24 % — ABNORMAL LOW (ref 39.0–52.0)
HCT: 25 % — ABNORMAL LOW (ref 39.0–52.0)
HCT: 25 % — ABNORMAL LOW (ref 39.0–52.0)
HCT: 25 % — ABNORMAL LOW (ref 39.0–52.0)
HCT: 26 % — ABNORMAL LOW (ref 39.0–52.0)
Hemoglobin: 7.5 g/dL — ABNORMAL LOW (ref 13.0–17.0)
Hemoglobin: 8.2 g/dL — ABNORMAL LOW (ref 13.0–17.0)
Hemoglobin: 8.2 g/dL — ABNORMAL LOW (ref 13.0–17.0)
Hemoglobin: 8.5 g/dL — ABNORMAL LOW (ref 13.0–17.0)
Hemoglobin: 8.5 g/dL — ABNORMAL LOW (ref 13.0–17.0)
Hemoglobin: 8.5 g/dL — ABNORMAL LOW (ref 13.0–17.0)
Hemoglobin: 8.8 g/dL — ABNORMAL LOW (ref 13.0–17.0)
O2 Saturation: 97 %
O2 Saturation: 97 %
O2 Saturation: 98 %
O2 Saturation: 99 %
O2 Saturation: 99 %
O2 Saturation: 99 %
O2 Saturation: 99 %
Patient temperature: 36.6
Patient temperature: 36.8
Patient temperature: 36.9
Patient temperature: 36.9
Patient temperature: 98.3
Patient temperature: 98.5
Patient temperature: 98.6
Potassium: 3.8 mmol/L (ref 3.5–5.1)
Potassium: 3.8 mmol/L (ref 3.5–5.1)
Potassium: 4 mmol/L (ref 3.5–5.1)
Potassium: 4 mmol/L (ref 3.5–5.1)
Potassium: 4.2 mmol/L (ref 3.5–5.1)
Potassium: 4.3 mmol/L (ref 3.5–5.1)
Potassium: 4.5 mmol/L (ref 3.5–5.1)
Sodium: 148 mmol/L — ABNORMAL HIGH (ref 135–145)
Sodium: 149 mmol/L — ABNORMAL HIGH (ref 135–145)
Sodium: 150 mmol/L — ABNORMAL HIGH (ref 135–145)
Sodium: 150 mmol/L — ABNORMAL HIGH (ref 135–145)
Sodium: 151 mmol/L — ABNORMAL HIGH (ref 135–145)
Sodium: 151 mmol/L — ABNORMAL HIGH (ref 135–145)
Sodium: 151 mmol/L — ABNORMAL HIGH (ref 135–145)
TCO2: 26 mmol/L (ref 22–32)
TCO2: 27 mmol/L (ref 22–32)
TCO2: 27 mmol/L (ref 22–32)
TCO2: 27 mmol/L (ref 22–32)
TCO2: 27 mmol/L (ref 22–32)
TCO2: 27 mmol/L (ref 22–32)
TCO2: 28 mmol/L (ref 22–32)
pCO2 arterial: 44.2 mmHg (ref 32.0–48.0)
pCO2 arterial: 44.6 mmHg (ref 32.0–48.0)
pCO2 arterial: 47.6 mmHg (ref 32.0–48.0)
pCO2 arterial: 50.8 mmHg — ABNORMAL HIGH (ref 32.0–48.0)
pCO2 arterial: 52.1 mmHg — ABNORMAL HIGH (ref 32.0–48.0)
pCO2 arterial: 52.8 mmHg — ABNORMAL HIGH (ref 32.0–48.0)
pCO2 arterial: 53.8 mmHg — ABNORMAL HIGH (ref 32.0–48.0)
pH, Arterial: 7.293 — ABNORMAL LOW (ref 7.350–7.450)
pH, Arterial: 7.302 — ABNORMAL LOW (ref 7.350–7.450)
pH, Arterial: 7.302 — ABNORMAL LOW (ref 7.350–7.450)
pH, Arterial: 7.313 — ABNORMAL LOW (ref 7.350–7.450)
pH, Arterial: 7.335 — ABNORMAL LOW (ref 7.350–7.450)
pH, Arterial: 7.352 (ref 7.350–7.450)
pH, Arterial: 7.377 (ref 7.350–7.450)
pO2, Arterial: 122 mmHg — ABNORMAL HIGH (ref 83.0–108.0)
pO2, Arterial: 128 mmHg — ABNORMAL HIGH (ref 83.0–108.0)
pO2, Arterial: 130 mmHg — ABNORMAL HIGH (ref 83.0–108.0)
pO2, Arterial: 136 mmHg — ABNORMAL HIGH (ref 83.0–108.0)
pO2, Arterial: 154 mmHg — ABNORMAL HIGH (ref 83.0–108.0)
pO2, Arterial: 92 mmHg (ref 83.0–108.0)
pO2, Arterial: 98 mmHg (ref 83.0–108.0)

## 2020-04-11 LAB — GLUCOSE, CAPILLARY
Glucose-Capillary: 135 mg/dL — ABNORMAL HIGH (ref 70–99)
Glucose-Capillary: 163 mg/dL — ABNORMAL HIGH (ref 70–99)
Glucose-Capillary: 168 mg/dL — ABNORMAL HIGH (ref 70–99)
Glucose-Capillary: 176 mg/dL — ABNORMAL HIGH (ref 70–99)
Glucose-Capillary: 185 mg/dL — ABNORMAL HIGH (ref 70–99)
Glucose-Capillary: 226 mg/dL — ABNORMAL HIGH (ref 70–99)
Glucose-Capillary: 45 mg/dL — ABNORMAL LOW (ref 70–99)

## 2020-04-11 LAB — CBC
HCT: 29.2 % — ABNORMAL LOW (ref 39.0–52.0)
HCT: 27.8 % — ABNORMAL LOW (ref 39.0–52.0)
Hemoglobin: 8.7 g/dL — ABNORMAL LOW (ref 13.0–17.0)
Hemoglobin: 8.6 g/dL — ABNORMAL LOW (ref 13.0–17.0)
MCH: 30.2 pg (ref 26.0–34.0)
MCH: 30.5 pg (ref 26.0–34.0)
MCHC: 29.8 g/dL — ABNORMAL LOW (ref 30.0–36.0)
MCHC: 30.9 g/dL (ref 30.0–36.0)
MCV: 101.4 fL — ABNORMAL HIGH (ref 80.0–100.0)
MCV: 98.6 fL (ref 80.0–100.0)
Platelets: 309 10*3/uL (ref 150–400)
Platelets: 313 10*3/uL (ref 150–400)
RBC: 2.88 MIL/uL — ABNORMAL LOW (ref 4.22–5.81)
RBC: 2.82 MIL/uL — ABNORMAL LOW (ref 4.22–5.81)
RDW: 16.7 % — ABNORMAL HIGH (ref 11.5–15.5)
RDW: 16.6 % — ABNORMAL HIGH (ref 11.5–15.5)
WBC: 13.1 10*3/uL — ABNORMAL HIGH (ref 4.0–10.5)
WBC: 12.3 10*3/uL — ABNORMAL HIGH (ref 4.0–10.5)
nRBC: 1 % — ABNORMAL HIGH (ref 0.0–0.2)
nRBC: 0.9 % — ABNORMAL HIGH (ref 0.0–0.2)

## 2020-04-11 LAB — BASIC METABOLIC PANEL
Anion gap: 7 (ref 5–15)
Anion gap: 9 (ref 5–15)
BUN: 50 mg/dL — ABNORMAL HIGH (ref 6–20)
BUN: 47 mg/dL — ABNORMAL HIGH (ref 6–20)
CO2: 25 mmol/L (ref 22–32)
CO2: 24 mmol/L (ref 22–32)
Calcium: 9 mg/dL (ref 8.9–10.3)
Calcium: 9 mg/dL (ref 8.9–10.3)
Chloride: 116 mmol/L — ABNORMAL HIGH (ref 98–111)
Chloride: 115 mmol/L — ABNORMAL HIGH (ref 98–111)
Creatinine, Ser: 0.72 mg/dL (ref 0.61–1.24)
Creatinine, Ser: 0.61 mg/dL (ref 0.61–1.24)
GFR, Estimated: >60 mL/min (ref >60)
GFR, Estimated: >60 mL/min (ref >60)
Glucose, Bld: 190 mg/dL — ABNORMAL HIGH (ref 70–99)
Glucose, Bld: 48 mg/dL — ABNORMAL LOW (ref 70–99)
Potassium: 3.8 mmol/L (ref 3.5–5.1)
Potassium: 3.8 mmol/L (ref 3.5–5.1)
Sodium: 148 mmol/L — ABNORMAL HIGH (ref 135–145)
Sodium: 148 mmol/L — ABNORMAL HIGH (ref 135–145)

## 2020-04-11 LAB — HEPATIC FUNCTION PANEL
ALT: 32 U/L (ref 0–44)
AST: 19 U/L (ref 15–41)
Albumin: 2.9 g/dL — ABNORMAL LOW (ref 3.5–5.0)
Alkaline Phosphatase: 143 U/L — ABNORMAL HIGH (ref 38–126)
Bilirubin, Direct: <0.1 mg/dL (ref 0.0–0.2)
Total Bilirubin: 0.8 mg/dL (ref 0.3–1.2)
Total Protein: 5.9 g/dL — ABNORMAL LOW (ref 6.5–8.1)

## 2020-04-11 LAB — LACTIC ACID, PLASMA
Lactic Acid, Venous: 1 mmol/L (ref 0.5–1.9)
Lactic Acid, Venous: 0.5 mmol/L (ref 0.5–1.9)

## 2020-04-11 LAB — LACTATE DEHYDROGENASE: LDH: 474 U/L — ABNORMAL HIGH (ref 98–192)

## 2020-04-11 LAB — FIBRINOGEN: Fibrinogen: 622 mg/dL — ABNORMAL HIGH (ref 210–475)

## 2020-04-11 LAB — APTT
aPTT: 73 seconds — ABNORMAL HIGH (ref 24–36)
aPTT: 78 seconds — ABNORMAL HIGH (ref 24–36)

## 2020-04-11 LAB — PROTIME-INR
INR: 1.5 — ABNORMAL HIGH (ref 0.8–1.2)
Prothrombin Time: 17.8 seconds — ABNORMAL HIGH (ref 11.4–15.2)

## 2020-04-11 MED ORDER — VALPROIC ACID 250 MG/5ML PO SOLN
125.0000 mg | Freq: Every day | ORAL | Status: AC
  Administered 2020-04-11 – 2020-04-12 (×2): 125 mg
  Filled 2020-04-11 (×2): qty 5

## 2020-04-11 MED ORDER — CHLORPROMAZINE HCL 25 MG PO TABS
25.0000 mg | ORAL_TABLET | Freq: Three times a day (TID) | ORAL | Status: AC
  Administered 2020-04-11 – 2020-04-13 (×6): 25 mg
  Filled 2020-04-11 (×6): qty 1

## 2020-04-11 MED ORDER — SODIUM CHLORIDE 0.9% FLUSH: 10.0000 mL | INTRAVENOUS | Status: DC | PRN

## 2020-04-11 MED ORDER — POTASSIUM CHLORIDE 20 MEQ PO PACK
40.0000 meq | PACK | Freq: Once | ORAL | Status: AC
  Administered 2020-04-11: 40 meq
  Filled 2020-04-11: qty 2

## 2020-04-11 MED ORDER — INSULIN DETEMIR 100 UNIT/ML ~~LOC~~ SOLN
40.0000 [IU] | Freq: Two times a day (BID) | SUBCUTANEOUS | Status: DC
  Administered 2020-04-11 – 2020-04-12 (×4): 40 [IU] via SUBCUTANEOUS
  Filled 2020-04-11 (×6): qty 0.4

## 2020-04-11 MED ORDER — DEXTROSE 50 % IV SOLN
25.0000 g | INTRAVENOUS | Status: AC
  Administered 2020-04-11: 25 g via INTRAVENOUS
  Filled 2020-04-11: qty 50

## 2020-04-11 MED ORDER — INSULIN ASPART 100 UNIT/ML ~~LOC~~ SOLN
4.0000 [IU] | SUBCUTANEOUS | Status: DC
  Administered 2020-04-11 – 2020-04-19 (×40): 4 [IU] via SUBCUTANEOUS

## 2020-04-11 MED ORDER — INSULIN ASPART 100 UNIT/ML ~~LOC~~ SOLN: 4.0000 [IU] | Freq: Four times a day (QID) | SUBCUTANEOUS | Status: DC

## 2020-04-11 MED ORDER — CHLORHEXIDINE GLUCONATE 0.12 % MT SOLN
OROMUCOSAL | Status: AC
  Administered 2020-04-11: 15 mL via OROMUCOSAL
  Filled 2020-04-11: qty 15

## 2020-04-11 MED ORDER — METOCLOPRAMIDE HCL 5 MG/ML IJ SOLN
10.0000 mg | Freq: Three times a day (TID) | INTRAMUSCULAR | Status: DC
  Administered 2020-04-11 – 2020-04-20 (×26): 10 mg via INTRAVENOUS
  Filled 2020-04-11 (×27): qty 2

## 2020-04-11 MED ORDER — IPRATROPIUM-ALBUTEROL 0.5-2.5 (3) MG/3ML IN SOLN
3.0000 mL | Freq: Four times a day (QID) | RESPIRATORY_TRACT | Status: AC
  Administered 2020-04-11 – 2020-04-13 (×8): 3 mL via RESPIRATORY_TRACT
  Filled 2020-04-11 (×8): qty 3

## 2020-04-11 MED ORDER — SODIUM CHLORIDE 0.9 % IV SOLN
25.0000 mg | Freq: Once | INTRAVENOUS | Status: AC
  Administered 2020-04-11: 25 mg via INTRAVENOUS
  Filled 2020-04-11: qty 1

## 2020-04-11 NOTE — Progress Notes (Signed)
Patient ID: Alex [name], male   DOB: November 29, 1970, 50 y.o.   MRN: [id number]     Advanced Heart Failure Rounding Note  PCP-Cardiologist: No primary care provider on file.   Subjective:    - 12/30: VV ECMO cannulation - 12/31: Left chest tube replaced - 1/2: Extubated. Echo with EF 60-65%, mildly dilated RV with mildly decreased systolic function.  - 1/4: Agitated, suspected aspiration.  Re-intubated.  - 1/5: ECMO cannula repositioned under TEE guidance. TEE showed moderately dilated/moderate-severely dysfunctional RV in setting of hypoxemia. LUE DVT found.  Patient got 1/2 dose of TPA due to initial concern for large PE.  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  - 1/6: Tracheostomy - 1/7: Echo with mild RV dilation/mild RV dysfunction.  - 1/16: Left chest tube out - 1/17: LUE arterial dopplers repeated, showed no obstruction.  - 1/20: Echo with EF 65-70%, mildly D-shaped septum, mildly dilated and mildly dysfunctional RV.  - 1/22: CTA chest: Bilateral upper lobe PEs (suspect chronic), changes c/w ARDS - 1/23: Bronchoscopy showed semi-occlusive ?mass/polyp in the trachea.  - 1/24: Bronchoscopy showed resolution of 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 - 1/26: ECMO circuit changed.   Failed turning sweep down to 6 yesterday due to acidosis (PCO2 not that high)  Cough improved with cetacaine and gabapentin.   Stood in bed x 30 mins. Sodium down with increase FW  CXR slightly worse left base. Tidal volumes stable at 280-300. FiO2 down to 40%  Vancomycin for Group F Strep and Enterococcus faecalis in trach aspirate.   ECMO parameters: 3360 rpm Flow 4.47 L/min Pvenous -83 Delta P 27 Sweep 6.5 FiO2 0.5 -> 0.4 TV 280-300 cc  ABG 7.35/45/136/99% LDH  1,117 => 911 => 751 => 699 => 691 => 534 => 488 => 547 => 461 => 474 PTT 73 Lactate 1.0  Objective:   Weight Range: 66.7 kg Body mass index is 25.24 kg/m.   Vital Signs:   Temp:  [98.1 F (36.7 C)-98.6 F (37 C)] 98.5  F (36.9 C) (01/30 0400) Pulse Rate:  [94-127] 104 (01/30 0700) Resp:  [16-39] 20 (01/30 0700) BP: (87-143)/(56-93) 115/79 (01/30 0700) SpO2:  [97 %-100 %] 100 % (01/30 0700) Arterial Line BP: (109-231)/(53-116) 143/69 (01/30 0700) FiO2 (%):  [40 %] 40 % (01/30 0408) Weight:  [66.7 kg] 66.7 kg (01/30 0700) Last BM Date: 04/09/20  Weight change: Filed Weights   04/09/20 0424 04/10/20 0406 04/11/20 0700  Weight: 66.7 kg 65.7 kg 66.7 kg    Intake/Output:   Intake/Output Summary (Last 24 hours) at 04/11/2020 0801 Last data filed at 04/11/2020 0700 Gross per 24 hour  Intake 3719.43 ml  Output 2230 ml  Net 1489.43 ml      Physical Exam    General:  Sitting up in bed. On vent through trach HEENT: normal Neck: supple. + ECMO cannula and trach Carotids 2+ bilat; no bruits. No lymphadenopathy or thryomegaly appreciated. Cor: PMI nondisplaced. Tachy regular  No rubs, gallops or murmurs. Lungs: coarse Abdomen: soft, nontender, nondistended. No hepatosplenomegaly. No bruits or masses. Good bowel sounds. Extremities: no cyanosis, clubbing, rash, edema L hand wrapped Neuro: awake on vent. Follows commands  Telemetry   NSR 100-110s Personally reviewed   Labs    CBC Recent Labs    04/10/20 1637 04/10/20 1810 04/11/20 0403 04/11/20 0408  WBC 16.1*  --  13.1*  --   HGB 9.2*   < > 8.7* 8.5*  HCT 31.3*   < > 29.2* 25.0*  MCV 100.6*  --  101.4*  --   PLT 331  --  309  --    < > = values in this interval not displayed.   Basic Metabolic Panel Recent Labs    04/10/20 1637 04/10/20 1810 04/11/20 0403 04/11/20 0408  NA 152*   < > 148* 151*  K 3.9   < > 3.8 3.8  CL 118*  --  116*  --   CO2 25  --  25  --   GLUCOSE 108*  --  190*  --   BUN 48*  --  50*  --   CREATININE 0.67  --  0.72  --   CALCIUM 9.4  --  9.0  --    < > = values in this interval not displayed.   Liver Function Tests No results for input(s): AST, ALT, ALKPHOS, BILITOT, PROT, ALBUMIN in the last 72  hours. No results for input(s): LIPASE, AMYLASE in the last 72 hours. Cardiac Enzymes No results for input(s): CKTOTAL, CKMB, CKMBINDEX, TROPONINI in the last 72 hours.  BNP: BNP (last 3 results) No results for input(s): BNP in the last 8760 hours.  ProBNP (last 3 results) No results for input(s): PROBNP in the last 8760 hours.   D-Dimer No results for input(s): DDIMER in the last 72 hours. Hemoglobin A1C No results for input(s): HGBA1C in the last 72 hours. Fasting Lipid Panel No results for input(s): CHOL, HDL, LDLCALC, TRIG, CHOLHDL, LDLDIRECT in the last 72 hours. Thyroid Function Tests No results for input(s): TSH, T4TOTAL, T3FREE, THYROIDAB in the last 72 hours.  Invalid input(s): FREET3  Other results:   Imaging    DG Abd 1 View  Result Date: 04/10/2020 CLINICAL DATA:  Nausea and vomiting EXAM: ABDOMEN - 1 VIEW COMPARISON:  Portable exam 1558 hours compared to 03/26/2020 FINDINGS: Tip of feeding tube projects over the proximal jejunum in the LEFT upper quadrant. Severely rotated to the LEFT. ECMO catheter within IVC. Bowel gas pattern normal. No osseous abnormalities. IMPRESSION: Tip of feeding tube projects over jejunal loops in the LEFT upper quadrant. Nonobstructive bowel gas pattern. Electronically Signed   By: Lavonia Dana M.D.   On: 04/10/2020 16:07   DG CHEST PORT 1 VIEW  Result Date: 04/11/2020 CLINICAL DATA:  Respiratory failure EXAM: PORTABLE CHEST 1 VIEW COMPARISON:  04/10/2020 FINDINGS: Tracheostomy tube, feeding tube and central venous line unchanged. Stable diffuse dense bilateral airspace disease. No pneumothorax. Pneumomediastinum. Large bore ECMO catheter unchanged. IMPRESSION: 1. Stable support apparatus. 2. No change in diffuse dense airspace disease. Electronically Signed   By: Suzy Bouchard M.D.   On: 04/11/2020 07:19     Medications:     Scheduled Medications: . sodium chloride   Intravenous Once  . acetaZOLAMIDE  500 mg Oral BID  .  bethanechol  10 mg Per Tube TID  . budesonide (PULMICORT) nebulizer solution  0.5 mg Nebulization BID  . carvedilol  12.5 mg Oral BID WC  . chlorhexidine gluconate (MEDLINE KIT)  15 mL Mouth Rinse BID  . Chlorhexidine Gluconate Cloth  6 each Topical Daily  . chlorpheniramine-HYDROcodone  5 mL Per Tube Q12H  . clonazePAM  1 mg Per Tube Daily  . clonazePAM  3 mg Per Tube QHS  . cloNIDine  0.1 mg Per Tube Q8H  . dextromethorphan  30 mg Per Tube TID  . docusate  100 mg Per Tube BID  . fiber  1 packet Per Tube BID  . free water  200  mL Per Tube Q6H  . gabapentin  300 mg Per Tube Q8H  . insulin aspart  0-20 Units Subcutaneous Q4H  . insulin detemir  30 Units Subcutaneous BID  . lactobacillus acidophilus  2 tablet Per Tube TID  . liver oil-zinc oxide   Topical 5 X Daily  . mouth rinse  15 mL Mouth Rinse 10 times per day  . melatonin  10 mg Per Tube QHS  . nitroGLYCERIN  0.5 inch Topical Q6H  . nutrition supplement (JUVEN)  1 packet Per Tube BID BM  . nystatin  5 mL Mouth/Throat QID  . oxyCODONE  15 mg Per Tube Q6H  . pantoprazole sodium  40 mg Per Tube QHS  . potassium chloride  40 mEq Per Tube Once  . QUEtiapine  200 mg Per Tube QHS  . sennosides  10 mL Per Tube QHS  . sodium chloride flush  10-40 mL Intracatheter Q12H  . valproic acid  250 mg Per Tube QHS    Infusions: . sodium chloride    . sodium chloride Stopped (04/09/20 1901)  . sodium chloride 250 mL (04/07/20 2203)  . sodium chloride Stopped (03/12/20 0131)  . albumin human Stopped (03/31/20 1745)  . bivalirudin (ANGIOMAX) infusion 0.5 mg/mL (Non-ACS indications) 0.08 mg/kg/hr (04/11/20 0700)  . feeding supplement (PIVOT 1.5 CAL) 70 mL/hr at 04/10/20 1700  . midazolam 1 mg/hr (04/11/20 0700)  . norepinephrine (LEVOPHED) Adult infusion Stopped (04/10/20 0132)    PRN Medications: Place/Maintain arterial line **AND** sodium chloride, sodium chloride, sodium chloride, acetaminophen (TYLENOL) oral liquid 160 mg/5 mL, albumin  human, [DISCONTINUED] lidocaine **AND** albuterol, butamben-tetracaine-benzocaine, guaiFENesin, haloperidol lactate, hydrALAZINE, labetalol, lip balm, midazolam, morphine CONCENTRATE, neomycin-bacitracin-polymyxin, ondansetron (ZOFRAN) IV, phenol, polyethylene glycol, simethicone, sodium chloride flush   Assessment/Plan   1. Acute hypoxemic respiratory failure: Due to COVID-19 PNA with bilateral infiltrates.  Refractory hypoxemia, VV-ECMO cannulation on 02/20/2020 with improvement in oxygenation.  Developed left PTX post-subclavian CVL and had left chest tube, the left lung is re-expanded and CT out.  He was extubated 1/2 but reintubated 1/4 with agitation and suspected aspiration.  Tracheostomy 1/6.  CTA chest 1/22 with suspected chronic PEs and ARDS. ECMO cannula repositioned 1/5. ECMO circuit changed 1/26. LDH stable today. Sweep at 7.5 with hypercarbia from significant dead space ventilation.  Now on vancomycin for Group F Strep and Enterococcus faecalis in trach aspirate. Vent settings improved. Failed turning sweep down to 6 overnight due to acidosis. Pco2 only 55. Bcarb down to 25. Will stop diamox for now. CXR slightly worse - Stop diamox. Continue to slowly wean sweep.  - Consider repeat chest CT later this week to look for progressive fibrosis/architectural changes - Continue current sedation with Versed.  - Patient has had remdesivir, tocilizumab. - Completed steroid taper. - Continue bivalirudin, goal PTT 65-80. He is at 94 today. Discussed dosing with PharmD personally.  - Continue to mobilize  - Continue Delsym, cetacaine and gabapentin for cough. Can increase gabapentin as needed 2. 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  Clot noted on TEE in RV as well.  TTE 1/2 showed normal EF 60-65%, RV improved (mildly dilated/dysfunctional). TEE on 1/5 with moderate to severe RV dysfunction but patient was hypoxemic.  Had 1/2 dose TPA  on 1/5. Echo 1/20 with mildly dilated/mildly dysfunctional RV. CTA chest 1/22 with chronic-appearing PEs in upper lobes. - Bivalirudin for goal PTT 65-80.  He is 75 today. Discussed dosing with PharmD personally. 3. Left  PTX: Left chest tube, lung is re-expanded. Tube now out, stable CXR. 4. Shock: Suspect septic/distributive.  Now resolved, off NE.  5. Anemia: Hgb 8.7 transfuse < 8.   6. AKI: Resolved.  7. Hyperglycemia: insulin.  8. HTN: BP stable.   - On clonidine.  - Continue Coreg 12.5 mg bid.   9. CHB: Episode of CHB when hypoxemic and with cough (suspect vagal).  NSR since then.    - Continue Coreg, watch rhythm.  10. Thrombocytopenia: Resolved  11. Ileus: Resolved. TFs restarted.  - Getting Reglan  - Cor-trak repositioned to post-pyloric placement 12. Ischemic digits: LUE.  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  Repeat study 1/17 showed no obstruction.  - Wound Care following. - Seems to be progressing slowly. Wil follow 13. ID: Group F Strep in sputum.  Also with Enterococcus faecalis in sputum.  - On vancomycin.  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   15. Hypernatremia: FW increased yesterday. Sodium improved.   CRITICAL CARE Performed by: Glori Bickers  Total critical care time: 35 minutes  Critical care time was exclusive of separately billable procedures and treating other patients.  Critical care was necessary to treat or prevent imminent or life-threatening deterioration.  Critical care was time spent personally by me on the following activities: development of treatment plan with patient and/or surrogate as well as nursing, discussions with consultants, evaluation of patient's response to treatment, examination of patient, obtaining history from patient or surrogate, ordering and performing treatments and interventions, ordering and review of  laboratory studies, ordering and review of radiographic studies, pulse oximetry and re-evaluation of patient's condition.    Length of Stay: Selmer, MD  04/11/2020, 8:01 AM  Advanced Heart Failure Team Pager [phone] (M-F; 7a - 4p)  Please contact [facility] for night-coverage after hours (4p -7a ) and weekends on [email or web address]

## 2020-04-11 NOTE — Plan of Care (Signed)
  Problem: Coping: Goal: Psychosocial and spiritual needs will be supported Outcome: Progressing   Problem: Role Relationship: Goal: Method of communication will improve Outcome: Progressing   Problem: Clinical Measurements: Goal: Respiratory complications will improve Outcome: Progressing Note: Number of coughing spells today greatly decreased with continuation of cetacaine spray, delsym, and addition of thorazine.     Problem: Activity: Goal: Risk for activity intolerance will decrease Outcome: Progressing Note: Stood in bed for 35 minutes at 38 degrees

## 2020-04-11 NOTE — Progress Notes (Signed)
Patient was moved from 2H23 to 2H24 with ECMO specialist, 3 RN's and RT without any complications. Cardiohelp was plugged up to a red outlet and oxygen was connected back to O2 outlet on wall.

## 2020-04-11 NOTE — Progress Notes (Signed)
ECMO PROGRESS NOTE  NAME:  Alex [name], MRN:  [id number], DOB:  April 10, 1970, LOS: 33 ADMISSION DATE:  March 24, 2020, CONSULTATION DATE: 03/10/2020 REFERRING MD: Wynona Neat -LBPCCM, CHIEF COMPLAINT: Respiratory failure requiring ECMO  HPI/course in hospital  50 year old man admitted to hospital 12/28 with 1 week history of dyspnea cough nausea and vomiting.  Initially admitted to [facility] and placed on high flow nasal cannula but rapidly failed and required intubation 12/29.  Persistent hypoxic respiratory failure with PF ratio 55 in spite of 18 of PEEP FiO2 0.1 despite paralytics.  Did not improve with prone ventilation  Cannulated for VV ECMO 12/30 via right IJ crescent cannula.  ECMO circuit was changed on 04/07/2020 Iatrogenic pneumothorax from left subclavian triple-lumen placement  Past Medical History  none  Interim history/subjective:  1/30: cough improved with changes. Sweep only able to wean from increase to 7 yesterday down to 6.5 today.   1/29: numbers improving. On sweep 6. Awake. Cough improved with cetacaine. Cont other measures for now.   1/28:off lasix, bicarb improved somewhat iwith increased acetazolamide. On sweep 7.5. awake and following commands. Worsening and persistent cough.   1/27:Patient is diuresing well on Lasix infusion, remains net even ECMO circuit exchange was done without complications yesterday  We will decrease sedation and try to wake him up  Overnight patient became hypotensive, hemoglobin dropped to 7.9, requiring 1 unit PRBC transfusion Objective   Blood pressure (!) 125/92, pulse (!) 110, temperature 98.4 F (36.9 C), temperature source Core, resp. rate (!) 22, height 5\' 4"  (1.626 m), weight 66.7 kg, SpO2 100 %.    Vent Mode: PCV FiO2 (%):  [40 %] 40 % Set Rate:  [20 bmp] 20 bmp PEEP:  [10 cmH20] 10 cmH20 Plateau Pressure:  [23 cmH20-34 cmH20] 31 cmH20   Intake/Output Summary (Last 24 hours) at 04/11/2020 0932 Last data filed at  04/11/2020 0845 Gross per 24 hour  Intake 3336.87 ml  Output 2160 ml  Net 1176.87 ml   Filed Weights   04/09/20 0424 04/10/20 0406 04/11/20 0700  Weight: 66.7 kg 65.7 kg 66.7 kg    Examination:   Physical exam: General: ill-appearing male, s/p trach HEENT: [place]/AT, eyes anicteric.  Moist mucous membranes.  ECMO cannulation on right side of the neck Neuro: Opens eyes with vocal stimuli, following basic commands, moving all 4 extremities spontaneously.  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 Monitoring mental status.  -cont pulmicort -cxr stable today with exception of L base HTN - labile and possibly circuit related.  -cont coreg  Acute delirium, with agitation -remains on versed but alert and following commands -also numerous oral/prn iv agents  Gastroparesis with some element ileus: improved Ischemic changes L hand -wound following -could involve plastics/vascular for  surgical intervention but not likely a candidate until declares borders +/- off circuit.  -did discuss with him likelihood of needed some portion of amputation of his  non-dominant hand  Poorly controlled diabetes type II -insulin -had hypoglycemia in light of holding tf after emesis yesterday.  -kub stable so resumed and tolerating tf now -will increase insulin back   Acute urinary retention Foley Bethanechol  Hypernatremia:  -stable with increased fwf, which were also held for a period of time yesterday with emesis  Emesis: -prn zofran -tolerating tf again today -kub without abnormality -still having stools      Daily Goals Checklist  Pain/Anxiety/Delirium protocol (if indicated): on versed infusion now. Off other continuous VAP protocol (if indicated): bundle in place.  DVT prophylaxis: bivalirudin GI prophylaxis: pantoprazole Glucose control: Euglcyemic on combination of SSI and Basal insulin. Mobility/therapy needs: Mobilization as tolerated, pt is more deconditioned than previous Code Status: Full code Disposition: ICU.    Critical care time: The patient is critically ill with multiple organ systems failure and requires high complexity decision making for assessment and support, frequent evaluation and titration of therapies, application of advanced monitoring technologies and extensive interpretation of multiple databases.  Critical care time 43 mins. This represents my time independent of the NPs time taking care of the pt. This is excluding procedures.    Briant Sites DO [facility] 04/11/2020, 9:32 AM

## 2020-04-11 NOTE — Procedures (Signed)
Extracorporeal support note  ECLS cannulation date: 04-06-20 Support day 31 Last circuit change: 04/07/2020  Indication: Acute hypoxic respiratory failure due to ARDS from COVID-19 pneumonia with RV dysfunction.    Configuration: Venovenous  Drainage cannula: 32 French crescent cannula via right IJ Return cannula: Same  Pump speed: 3360 RPM Pump flow: 4.4 L/min Pump used: Cardio help  Oxygenator: Cardio help O2 blender: 100% Sweep gas: 6.5L  Circuit check: No clots Anticoagulant: Bivalirudin Anticoagulation targets: PTT 60-80  Changes in support:  Cont slow wean as tolerated. Cxr with worsened L base but vent mechanics stable. Cough improved with changes made yesterday, will continue.   Anticipated goals/duration of support: Bridge to recovery.  Multidisciplinary ECMO rounds completed.   Critical care time: The patient is critically ill with multiple organ systems failure and requires high complexity decision making for assessment and support, frequent evaluation and titration of therapies, application of advanced monitoring technologies and extensive interpretation of multiple databases.  Critical care time 35 mins. This represents my time independent of the NPs time taking care of the pt. This is excluding procedures.    Briant Sites DO [facility] 04/11/2020, 9:26 AM

## 2020-04-11 NOTE — Progress Notes (Signed)
Patient ID: Alex [name], male   DOB: [birth year], 50 y.o.   MRN: [id number]  This NP visited patient at the bedside as a follow up to  yesterday's GOCs meeting.  49 y.o.malewith no significantpast medical history admitted on12/28/2021with dyspnea, cough, nausea/vomiting ~ 1 week ago with worsening symptoms of body aches and fatigue and +COVID 02/07/20 and admitted with shortness of breath.Required intubation 03/10/20. Cannulated for VV ECMO 2020/03/18. Oxygenating better with ECMO. Also evidence of RLE DVT, RV thrombus, and high suspicion of PE. Has required chest tube to L lung for collapse which needs further reposition with recurrent collapse.Trach placed 03-18-20  Today is day 33 of this hospitalization.  Patient remains ventilator dependent and on ECMO.      Patient is critically ill and prognosis moving forward is guarded.   He is high risk for decompensation  I spoke to [name]/patient's son by telephone for continued support as he navigates his father's complex medical situation.  Alex [name] tells me that he plans to be moving to [place] sometime between February 5 and 10.  At that time he wishes to turn over the right to make medical decisions for Alex [name] to his fathers friend  Alex [name].  Throughout this long hospitalization Alex [name] has been supported not only from his uncle Alex [name] but patient's friend Alex [name].  They have continued to have ongoing conversation and support each other through  difficult decisions.     I spoke with Alex [name] by telephone and he confirms that he is willing to take on the responsibility of medical decision maker for this patient once patient's [name] leaves the state.  However he reinforces that there will be ongoing conversation amongst himself, the patient's son, and the patient's brother through out ongoing illness trajectory.  Discussed with both Alex [name] and Alex [name] the importance of continued conversation with  each other  and the medical providers regarding overall plan of care and treatment options,  ensuring decisions are within the context of the patients values and GOCs.  Questions and concerns addressed   Discussed with bedside RN       PMT will continue to support holistically  Total time spent on the unit was 35 minutes  Greater than 50% of the time was spent in counseling and coordination of care  Lorinda Creed NP  Palliative Medicine Team Team Phone # [phone] Pager [phone]

## 2020-04-11 NOTE — Progress Notes (Signed)
ANTICOAGULATION CONSULT NOTE  Pharmacy Consult for bivalirudin Indication: ECMO and VTE  Labs: Recent Labs    04/09/20 0409 04/09/20 0418 04/10/20 0355 04/10/20 0357 04/10/20 1637 04/10/20 1810 04/11/20 0003 04/11/20 0403 04/11/20 0408  HGB 9.9*   < > 9.0*   < > 9.2*   < > 7.5* 8.7* 8.5*  HCT 31.1*   < > 28.9*   < > 31.3*   < > 22.0* 29.2* 25.0*  PLT 309   < > 309  --  331  --   --  309  --   APTT 79*   < > 79*  --  80*  --   --  73*  --   LABPROT 17.3*  --  17.5*  --   --   --   --  17.8*  --   INR 1.5*  --  1.5*  --   --   --   --  1.5*  --   CREATININE 0.82   < > 0.71  --  0.67  --   --  0.72  --    < > = values in this interval not displayed.    Assessment: 57 yoM admitted with COVID-19 PNA with worsening hypoxia, s/p cannulation for ECMO. Pt was started on IV heparin prior to cannulation due to acute DVTs and possible PE, transitioned to bivalirudin with ECMO. Now s/p tPA on 1/5 and tracheostomy on 1/6. Circuit last changed 1/26.  APTT remains therapeutic and stable. No bleeding or circuit issues per discussion with nursing.  Goal of Therapy:  aPTT 60-80 seconds   Plan:  Continue bivalirudin at 0.08 mg/kg/hr (using order-specific wt 72.1kg) Monitor q12h aPTT/CBC, LDH, and for s/sx of bleeding  Tama Headings, PharmD, BCPS PGY2 Cardiology Pharmacy Resident Phone: [phone] 04/11/2020  7:44 AM  Please check [email or web address] for unit-specific pharmacy phone numbers.

## 2020-04-11 NOTE — Progress Notes (Signed)
PICC placed per ECG at [facility].  Noted deflection throughout the SVC.  Dr Gala Romney aware and stated no changes need to be made.

## 2020-04-11 NOTE — Progress Notes (Signed)
Patient moved from [facility] room # 23 to 2H room # 24 without any complications.

## 2020-04-11 NOTE — Progress Notes (Signed)
ANTICOAGULATION CONSULT NOTE  Pharmacy Consult for bivalirudin Indication: ECMO and VTE  Labs: Recent Labs    04/09/20 0409 04/09/20 0418 04/10/20 0355 04/10/20 0357 04/10/20 1637 04/10/20 1810 04/11/20 0403 04/11/20 0408 04/11/20 1610 04/11/20 1616 04/11/20 1717  HGB 9.9*   < > 9.0*   < > 9.2*   < > 8.7*   < > 8.6* 8.2* 8.2*  HCT 31.1*   < > 28.9*   < > 31.3*   < > 29.2*   < > 27.8* 24.0* 24.0*  PLT 309   < > 309  --  331  --  309  --  313  --   --   APTT 79*   < > 79*  --  80*  --  73*  --  78*  --   --   LABPROT 17.3*  --  17.5*  --   --   --  17.8*  --   --   --   --   INR 1.5*  --  1.5*  --   --   --  1.5*  --   --   --   --   CREATININE 0.82   < > 0.71  --  0.67  --  0.72  --  0.61  --   --    < > = values in this interval not displayed.    Assessment: 45 yoM admitted with COVID-19 PNA with worsening hypoxia, s/p cannulation for ECMO. Pt was started on IV heparin prior to cannulation due to acute DVTs and possible PE, transitioned to bivalirudin with ECMO. Now s/p tPA on 1/5 and tracheostomy on 1/6. Circuit last changed 1/26.  APTT remains therapeutic and stable this evening. No bleeding or circuit issues per discussion with nursing.  Goal of Therapy:  aPTT 60-80 seconds   Plan:  Continue bivalirudin at 0.08 mg/kg/hr (using order-specific wt 72.1kg) Monitor q12h aPTT/CBC, LDH, and for s/sx of bleeding  Tad Moore, BCPS, BCCP Clinical Pharmacist  04/11/2020 6:18 PM   [facility] pharmacy phone numbers are listed on [email or web address]

## 2020-04-11 NOTE — Progress Notes (Signed)
Secure chat with Dr Gala Romney, new orders to place PICC as per medical necessity.

## 2020-04-11 NOTE — Progress Notes (Signed)
Multiple attempts to call son, no answer.  RN notified.  RN will obtain PICC consent if speaks with him.  Will continue to call.

## 2020-04-11 NOTE — Progress Notes (Signed)
Hypoglycemic Event  CBG: 45  Treatment: D50 50 mL (25 gm)  Symptoms: None  Follow-up CBG: Time:1641 CBG Result:168  Possible Reasons for Event: Inadequate meal intake and Other: tube feeds on hold  Comments/MD notified:Marshall, Shanda Bumps, DO    Allene Pyo

## 2020-04-11 NOTE — Progress Notes (Signed)
Pt was laying flat for picc procedure and again had emesis as yesterday.   Will start reglan again as pt tolerated this and perhaps req some motility agent. Will closely follow.

## 2020-04-11 NOTE — Progress Notes (Signed)
Peripherally Inserted Central Catheter Placement  The IV Nurse has discussed with the patient and/or persons authorized to consent for the patient, the purpose of this procedure and the potential benefits and risks involved with this procedure.  The benefits include less needle sticks, lab draws from the catheter, and the patient may be discharged home with the catheter. Risks include, but not limited to, infection, bleeding, blood clot (thrombus formation), and puncture of an artery; nerve damage and irregular heartbeat and possibility to perform a PICC exchange if needed/ordered by physician.  Alternatives to this procedure were also discussed.  Bard Power PICC patient education guide, fact sheet on infection prevention and patient information card has been provided to patient /or left at bedside.    PICC Placement Documentation  PICC Triple Lumen 04/11/20 PICC Right Brachial 39 cm 1 cm (Active)  Indication for Insertion or Continuance of Line Prolonged intravenous therapies 04/11/20 1035  Exposed Catheter (cm) 0 cm 04/11/20 1035  Site Assessment Clean;Intact;Dry 04/11/20 1035  Lumen #1 Status Flushed;Blood return noted 04/11/20 1035  Lumen #2 Status Flushed;Blood return noted;Saline locked 04/11/20 1035  Lumen #3 Status Flushed;Blood return noted;Saline locked 04/11/20 1035  Dressing Type Transparent 04/11/20 1035  Dressing Status Clean;Dry;Intact 04/11/20 1035  Antimicrobial disc in place? Yes 04/11/20 1035  Dressing Change Due 04/18/20 04/11/20 1035       Audrie Gallus 04/11/2020, 10:37 AM

## 2020-04-12 ENCOUNTER — Inpatient Hospital Stay (HOSPITAL_COMMUNITY): Payer: Medicaid Other

## 2020-04-12 LAB — POCT I-STAT 7, (LYTES, BLD GAS, ICA,H+H)
Acid-Base Excess: 1 mmol/L (ref 0.0–2.0)
Acid-Base Excess: 2 mmol/L (ref 0.0–2.0)
Acid-Base Excess: 2 mmol/L (ref 0.0–2.0)
Acid-Base Excess: 5 mmol/L — ABNORMAL HIGH (ref 0.0–2.0)
Acid-Base Excess: 3 mmol/L — ABNORMAL HIGH (ref 0.0–2.0)
Acid-Base Excess: 4 mmol/L — ABNORMAL HIGH (ref 0.0–2.0)
Acid-Base Excess: 5 mmol/L — ABNORMAL HIGH (ref 0.0–2.0)
Acid-Base Excess: 5 mmol/L — ABNORMAL HIGH (ref 0.0–2.0)
Acid-Base Excess: 5 mmol/L — ABNORMAL HIGH (ref 0.0–2.0)
Bicarbonate: 27.1 mmol/L (ref 20.0–28.0)
Bicarbonate: 28.4 mmol/L — ABNORMAL HIGH (ref 20.0–28.0)
Bicarbonate: 28.9 mmol/L — ABNORMAL HIGH (ref 20.0–28.0)
Bicarbonate: 30.8 mmol/L — ABNORMAL HIGH (ref 20.0–28.0)
Bicarbonate: 30.8 mmol/L — ABNORMAL HIGH (ref 20.0–28.0)
Bicarbonate: 30.9 mmol/L — ABNORMAL HIGH (ref 20.0–28.0)
Bicarbonate: 32 mmol/L — ABNORMAL HIGH (ref 20.0–28.0)
Bicarbonate: 31 mmol/L — ABNORMAL HIGH (ref 20.0–28.0)
Bicarbonate: 31.7 mmol/L — ABNORMAL HIGH (ref 20.0–28.0)
Calcium, Ion: 1.34 mmol/L (ref 1.15–1.40)
Calcium, Ion: 1.35 mmol/L (ref 1.15–1.40)
Calcium, Ion: 1.31 mmol/L (ref 1.15–1.40)
Calcium, Ion: 1.29 mmol/L (ref 1.15–1.40)
Calcium, Ion: 1.35 mmol/L (ref 1.15–1.40)
Calcium, Ion: 1.33 mmol/L (ref 1.15–1.40)
Calcium, Ion: 1.32 mmol/L (ref 1.15–1.40)
Calcium, Ion: 1.27 mmol/L (ref 1.15–1.40)
Calcium, Ion: 1.3 mmol/L (ref 1.15–1.40)
HCT: 24 % — ABNORMAL LOW (ref 39.0–52.0)
HCT: 24 % — ABNORMAL LOW (ref 39.0–52.0)
HCT: 25 % — ABNORMAL LOW (ref 39.0–52.0)
HCT: 24 % — ABNORMAL LOW (ref 39.0–52.0)
HCT: 27 % — ABNORMAL LOW (ref 39.0–52.0)
HCT: 25 % — ABNORMAL LOW (ref 39.0–52.0)
HCT: 27 % — ABNORMAL LOW (ref 39.0–52.0)
HCT: 25 % — ABNORMAL LOW (ref 39.0–52.0)
HCT: 26 % — ABNORMAL LOW (ref 39.0–52.0)
Hemoglobin: 8.2 g/dL — ABNORMAL LOW (ref 13.0–17.0)
Hemoglobin: 8.2 g/dL — ABNORMAL LOW (ref 13.0–17.0)
Hemoglobin: 8.5 g/dL — ABNORMAL LOW (ref 13.0–17.0)
Hemoglobin: 8.2 g/dL — ABNORMAL LOW (ref 13.0–17.0)
Hemoglobin: 9.2 g/dL — ABNORMAL LOW (ref 13.0–17.0)
Hemoglobin: 8.5 g/dL — ABNORMAL LOW (ref 13.0–17.0)
Hemoglobin: 9.2 g/dL — ABNORMAL LOW (ref 13.0–17.0)
Hemoglobin: 8.5 g/dL — ABNORMAL LOW (ref 13.0–17.0)
Hemoglobin: 8.8 g/dL — ABNORMAL LOW (ref 13.0–17.0)
O2 Saturation: 96 %
O2 Saturation: 98 %
O2 Saturation: 97 %
O2 Saturation: 98 %
O2 Saturation: 97 %
O2 Saturation: 98 %
O2 Saturation: 96 %
O2 Saturation: 98 %
O2 Saturation: 96 %
Patient temperature: 98.3
Patient temperature: 36.9
Patient temperature: 36.6
Patient temperature: 36.9
Patient temperature: 36.9
Patient temperature: 36.9
Patient temperature: 37
Patient temperature: 37
Patient temperature: 98.8
Potassium: 4.1 mmol/L (ref 3.5–5.1)
Potassium: 4.2 mmol/L (ref 3.5–5.1)
Potassium: 3.9 mmol/L (ref 3.5–5.1)
Potassium: 3.9 mmol/L (ref 3.5–5.1)
Potassium: 4.1 mmol/L (ref 3.5–5.1)
Potassium: 3.8 mmol/L (ref 3.5–5.1)
Potassium: 4.5 mmol/L (ref 3.5–5.1)
Potassium: 4.1 mmol/L (ref 3.5–5.1)
Potassium: 4.3 mmol/L (ref 3.5–5.1)
Sodium: 149 mmol/L — ABNORMAL HIGH (ref 135–145)
Sodium: 149 mmol/L — ABNORMAL HIGH (ref 135–145)
Sodium: 149 mmol/L — ABNORMAL HIGH (ref 135–145)
Sodium: 148 mmol/L — ABNORMAL HIGH (ref 135–145)
Sodium: 146 mmol/L — ABNORMAL HIGH (ref 135–145)
Sodium: 147 mmol/L — ABNORMAL HIGH (ref 135–145)
Sodium: 146 mmol/L — ABNORMAL HIGH (ref 135–145)
Sodium: 147 mmol/L — ABNORMAL HIGH (ref 135–145)
Sodium: 146 mmol/L — ABNORMAL HIGH (ref 135–145)
TCO2: 29 mmol/L (ref 22–32)
TCO2: 30 mmol/L (ref 22–32)
TCO2: 31 mmol/L (ref 22–32)
TCO2: 33 mmol/L — ABNORMAL HIGH (ref 22–32)
TCO2: 33 mmol/L — ABNORMAL HIGH (ref 22–32)
TCO2: 33 mmol/L — ABNORMAL HIGH (ref 22–32)
TCO2: 34 mmol/L — ABNORMAL HIGH (ref 22–32)
TCO2: 33 mmol/L — ABNORMAL HIGH (ref 22–32)
TCO2: 33 mmol/L — ABNORMAL HIGH (ref 22–32)
pCO2 arterial: 51 mmHg — ABNORMAL HIGH (ref 32.0–48.0)
pCO2 arterial: 54.9 mmHg — ABNORMAL HIGH (ref 32.0–48.0)
pCO2 arterial: 54.4 mmHg — ABNORMAL HIGH (ref 32.0–48.0)
pCO2 arterial: 55.3 mmHg — ABNORMAL HIGH (ref 32.0–48.0)
pCO2 arterial: 66.5 mmHg (ref 32.0–48.0)
pCO2 arterial: 61 mmHg — ABNORMAL HIGH (ref 32.0–48.0)
pCO2 arterial: 61.1 mmHg — ABNORMAL HIGH (ref 32.0–48.0)
pCO2 arterial: 54.5 mmHg — ABNORMAL HIGH (ref 32.0–48.0)
pCO2 arterial: 56.9 mmHg — ABNORMAL HIGH (ref 32.0–48.0)
pH, Arterial: 7.333 — ABNORMAL LOW (ref 7.350–7.450)
pH, Arterial: 7.321 — ABNORMAL LOW (ref 7.350–7.450)
pH, Arterial: 7.331 — ABNORMAL LOW (ref 7.350–7.450)
pH, Arterial: 7.354 (ref 7.350–7.450)
pH, Arterial: 7.273 — ABNORMAL LOW (ref 7.350–7.450)
pH, Arterial: 7.313 — ABNORMAL LOW (ref 7.350–7.450)
pH, Arterial: 7.327 — ABNORMAL LOW (ref 7.350–7.450)
pH, Arterial: 7.363 (ref 7.350–7.450)
pH, Arterial: 7.354 (ref 7.350–7.450)
pO2, Arterial: 90 mmHg (ref 83.0–108.0)
pO2, Arterial: 118 mmHg — ABNORMAL HIGH (ref 83.0–108.0)
pO2, Arterial: 100 mmHg (ref 83.0–108.0)
pO2, Arterial: 121 mmHg — ABNORMAL HIGH (ref 83.0–108.0)
pO2, Arterial: 102 mmHg (ref 83.0–108.0)
pO2, Arterial: 115 mmHg — ABNORMAL HIGH (ref 83.0–108.0)
pO2, Arterial: 87 mmHg (ref 83.0–108.0)
pO2, Arterial: 113 mmHg — ABNORMAL HIGH (ref 83.0–108.0)
pO2, Arterial: 88 mmHg (ref 83.0–108.0)

## 2020-04-12 LAB — LACTIC ACID, PLASMA
Lactic Acid, Venous: 1 mmol/L (ref 0.5–1.9)
Lactic Acid, Venous: 0.7 mmol/L (ref 0.5–1.9)

## 2020-04-12 LAB — BASIC METABOLIC PANEL
Anion gap: 8 (ref 5–15)
Anion gap: 9 (ref 5–15)
BUN: 47 mg/dL — ABNORMAL HIGH (ref 6–20)
BUN: 53 mg/dL — ABNORMAL HIGH (ref 6–20)
CO2: 25 mmol/L (ref 22–32)
CO2: 29 mmol/L (ref 22–32)
Calcium: 9.1 mg/dL (ref 8.9–10.3)
Calcium: 8.7 mg/dL — ABNORMAL LOW (ref 8.9–10.3)
Chloride: 113 mmol/L — ABNORMAL HIGH (ref 98–111)
Chloride: 107 mmol/L (ref 98–111)
Creatinine, Ser: 0.67 mg/dL (ref 0.61–1.24)
Creatinine, Ser: 0.66 mg/dL (ref 0.61–1.24)
GFR, Estimated: >60 mL/min (ref >60)
GFR, Estimated: >60 mL/min (ref >60)
Glucose, Bld: 200 mg/dL — ABNORMAL HIGH (ref 70–99)
Glucose, Bld: 185 mg/dL — ABNORMAL HIGH (ref 70–99)
Potassium: 4.1 mmol/L (ref 3.5–5.1)
Potassium: 4.4 mmol/L (ref 3.5–5.1)
Sodium: 146 mmol/L — ABNORMAL HIGH (ref 135–145)
Sodium: 145 mmol/L (ref 135–145)

## 2020-04-12 LAB — GLUCOSE, CAPILLARY
Glucose-Capillary: 107 mg/dL — ABNORMAL HIGH (ref 70–99)
Glucose-Capillary: 118 mg/dL — ABNORMAL HIGH (ref 70–99)
Glucose-Capillary: 153 mg/dL — ABNORMAL HIGH (ref 70–99)
Glucose-Capillary: 161 mg/dL — ABNORMAL HIGH (ref 70–99)
Glucose-Capillary: 184 mg/dL — ABNORMAL HIGH (ref 70–99)
Glucose-Capillary: 87 mg/dL (ref 70–99)
Glucose-Capillary: 99 mg/dL (ref 70–99)

## 2020-04-12 LAB — CBC
HCT: 27.8 % — ABNORMAL LOW (ref 39.0–52.0)
HCT: 29.4 % — ABNORMAL LOW (ref 39.0–52.0)
Hemoglobin: 8.3 g/dL — ABNORMAL LOW (ref 13.0–17.0)
Hemoglobin: 8.9 g/dL — ABNORMAL LOW (ref 13.0–17.0)
MCH: 29.9 pg (ref 26.0–34.0)
MCH: 29.9 pg (ref 26.0–34.0)
MCHC: 29.9 g/dL — ABNORMAL LOW (ref 30.0–36.0)
MCHC: 30.3 g/dL (ref 30.0–36.0)
MCV: 100 fL (ref 80.0–100.0)
MCV: 98.7 fL (ref 80.0–100.0)
Platelets: 308 10*3/uL (ref 150–400)
Platelets: 328 10*3/uL (ref 150–400)
RBC: 2.78 MIL/uL — ABNORMAL LOW (ref 4.22–5.81)
RBC: 2.98 MIL/uL — ABNORMAL LOW (ref 4.22–5.81)
RDW: 16.5 % — ABNORMAL HIGH (ref 11.5–15.5)
RDW: 16.4 % — ABNORMAL HIGH (ref 11.5–15.5)
WBC: 15.7 10*3/uL — ABNORMAL HIGH (ref 4.0–10.5)
WBC: 18.8 10*3/uL — ABNORMAL HIGH (ref 4.0–10.5)
nRBC: 0.4 % — ABNORMAL HIGH (ref 0.0–0.2)
nRBC: 0.5 % — ABNORMAL HIGH (ref 0.0–0.2)

## 2020-04-12 LAB — PROTIME-INR
INR: 1.4 — ABNORMAL HIGH (ref 0.8–1.2)
Prothrombin Time: 17 seconds — ABNORMAL HIGH (ref 11.4–15.2)

## 2020-04-12 LAB — APTT
aPTT: 72 seconds — ABNORMAL HIGH (ref 24–36)
aPTT: 73 seconds — ABNORMAL HIGH (ref 24–36)

## 2020-04-12 LAB — LACTATE DEHYDROGENASE: LDH: 489 U/L — ABNORMAL HIGH (ref 98–192)

## 2020-04-12 MED ORDER — GABAPENTIN 250 MG/5ML PO SOLN
600.0000 mg | Freq: Three times a day (TID) | ORAL | Status: DC
  Administered 2020-04-12 – 2020-04-16 (×12): 600 mg
  Filled 2020-04-12 (×13): qty 12

## 2020-04-12 MED ORDER — CARVEDILOL 25 MG PO TABS
25.0000 mg | ORAL_TABLET | Freq: Two times a day (BID) | ORAL | Status: DC
  Administered 2020-04-12 – 2020-04-23 (×24): 25 mg via ORAL
  Filled 2020-04-12 (×24): qty 1

## 2020-04-12 MED ORDER — FUROSEMIDE 10 MG/ML IJ SOLN
40.0000 mg | Freq: Once | INTRAMUSCULAR | Status: AC
  Administered 2020-04-12: 40 mg via INTRAVENOUS
  Filled 2020-04-12: qty 4

## 2020-04-12 NOTE — Progress Notes (Signed)
Inpatient Diabetes Program Recommendations  AACE/ADA: New Consensus Statement on Inpatient Glycemic Control (2015)  Target Ranges:  Prepandial:   less than 140 mg/dL      Peak postprandial:   less than 180 mg/dL (1-2 hours)      Critically ill patients:  140 - 180 mg/dL   Lab Results  Component Value Date   GLUCAP 153 (H) 04/12/2020   HGBA1C 11.8 (H) 03/08/2020    Review of Glycemic Control Results for GARRETTE, CAINE (MRN [id number]) as of 04/12/2020 09:15  Ref. Range 04/11/2020 20:06 04/11/2020 23:58 04/12/2020 04:07 04/12/2020 07:51  Glucose-Capillary Latest Ref Range: 70 - 99 mg/dL 384 (H) 665 (H) 993 (H) 153 (H)   Diabetes history: Type 2 DM Outpatient Diabetes medications: none Current orders for Inpatient glycemic control: Novolog 0-20 units Q4H, Novolog 4 units Q4H, Levemir 40 units BID Pivot 70 ml/hr  Inpatient Diabetes Program Recommendations:    Noted hypoglycemia of 48 mg/dL. Tube feeds were on hold due to patient vomiting. This is second episode in last two days. Tube feed coverage given appropriately. Would recommend decreasing Levemir back to 35 units BID.   Thanks, Lujean Rave, MSN, RNC-OB Diabetes Coordinator [phone] (8a-5p)

## 2020-04-12 NOTE — Procedures (Addendum)
Extracorporeal support note  ECLS cannulation date: 02/19/2020 Last circuit change: 04/07/2020  Indication: Acute hypoxic respiratory failure due to ARDS from COVID-19 pneumonia with RV dysfunction.   Configuration: Venovenous  Drainage cannula: 32 French crescent cannula via right IJ Return cannula: Same  Pump speed: 3300 RPM Pump flow: 4.33 L/min Pump used: Cardio help  Oxygenator: Cardio help O2 blender: 100% Sweep gas: 5L  Circuit check: No clots Anticoagulant: Bivalirudin Anticoagulation targets: PTT 60-80  Changes in support:  Sweep weans as tolerated  Anticipated goals/duration of support: Bridge to recovery.  Multidisciplinary ECMO rounds completed.  Myrla Halsted MD PCCM 04/12/20

## 2020-04-12 NOTE — Progress Notes (Addendum)
Physical Therapy Treatment Patient Details Name: Alex [name] MRN: [id number] DOB: [birth year] Today's Date: 04/12/2020    History of Present Illness Pt is 50 y.o. male  with no significant past medical history admitted on 02/21/2020 with dyspnea, cough, nausea/vomiting ~ 1 week ago with worsening symptoms of body aches and fatigue and +COVID 02/07/20 and admitted with shortness of breath. Required intubation 03/10/20. Cannulated for VV ECMO 02/18/2020. Oxygenating better with ECMO. Also evidence of RLE DVT, RV thrombus, and high suspicion of PE. Has required chest tube to L lung for collapse which needs further reposition with recurrent collapse. Extubated 03-15-19.  Reintubated 1/5 and trach placed 1/6.    PT Comments    Pt on ECMO and 40% FiO2 vent to trach - RN , ECMO RN, and OT in for treatment.  RN reports pt attempted to slide legs to EOB earlier , so attempted sitting EOB today.  Pt tolerated 5 mins at EOB with min-mod A for balance.  Pt with increased coughing requiring return to supine.  Nursing has been performing tilts on KREG bed - recommend continuing this.  Goals were due and adjusted to reflect slow progress due to medical status and still on ECMO.     Follow Up Recommendations  CIR;Supervision/Assistance - 24 hour     Equipment Recommendations  Other (comment) (TBD (pt still on ECMO and Vent))    Recommendations for Other Services       Precautions / Restrictions Precautions Precautions: Fall Precaution Comments: ECMO, NGtube, Foley, trach Required Braces or Orthoses: Splint/Cast Splint/Cast: LUE resting hand splint applied with instruction to RN to do 2 hours on 2 hours off. Splint/Cast - Date Prophylactic Dressing Applied (if applicable): 04/12/20 Restrictions Weight Bearing Restrictions: No    Mobility  Bed Mobility Overal bed mobility: Needs Assistance Bed Mobility: Supine to Sit;Sit to Supine   Sidelying to sit: Total assist;+2 for physical assistance Supine  to sit: Total assist;+2 for physical assistance     General bed mobility comments: A for legs and trunk: PT/OT assisted with mobility, nursing into assist with ECMO lines  Transfers                 General transfer comment: Unable to attempt - on ECMO  Ambulation/Gait                 Stairs             Wheelchair Mobility    Modified Rankin (Stroke Patients Only)       Balance Overall balance assessment: Needs assistance Sitting-balance support: No upper extremity supported;Feet supported Sitting balance-Leahy Scale: Poor Sitting balance - Comments: Varied from min A-mod A with left lateral lean as time progressed ; Sat EOB 5 minutes; limited due to coughing Postural control: Left lateral lean     Standing balance comment: unable                            Cognition Arousal/Alertness: Awake/alert Behavior During Therapy: Flat affect Overall Cognitive Status: Difficult to assess                                 General Comments: Spontaneously tried to start to sit up (used right hand on rail to pull self forward) before we were ready for him to start to sit up. Then when we were ready he needed increased A to sit  up. Followed one command while seated EOB (to touch my hand with his RUE--it was more that he opened his hand than reached up to touch my hand). RN reports shortly before we arrived pt took [place] in right hand and suctioned his mouth.      Exercises General Exercises - Lower Extremity Ankle Circles/Pumps: PROM;Both;10 reps;Supine Heel Slides: PROM;Both;10 reps;Supine Hip ABduction/ADduction: PROM;Both;10 reps;Supine Limited exercise due to fatigue post sitting EOB    General Comments General comments (skin integrity, edema, etc.): Pt on trach to vent with FiO2 40% and 10 PEEP.  Also on ECMO      Pertinent Vitals/Pain Pain Assessment: Faces Faces Pain Scale: Hurts little more Pain Location: generalized- more  centralized to coughing Pain Descriptors / Indicators: Grimacing;Guarding;Discomfort Pain Intervention(s): Limited activity within patient's tolerance;Monitored during session;Repositioned    Home Living                      Prior Function            PT Goals (current goals can now be found in the care plan section) Acute Rehab PT Goals Patient Stated Goal: unable to state PT Goal Formulation: With patient Time For Goal Achievement: 04/26/20 Potential to Achieve Goals: Fair Progress towards PT goals: Progressing toward goals    Frequency    Min 3X/week      PT Plan Current plan remains appropriate    Co-evaluation PT/OT/SLP Co-Evaluation/Treatment: Yes Reason for Co-Treatment: Complexity of the patient's impairments (multi-system involvement);For patient/therapist safety PT goals addressed during session: Mobility/safety with mobility;Balance OT goals addressed during session: Strengthening/ROM      AM-PAC PT "6 Clicks" Mobility   Outcome Measure  Help needed turning from your back to your side while in a flat bed without using bedrails?: Total Help needed moving from lying on your back to sitting on the side of a flat bed without using bedrails?: Total Help needed moving to and from a bed to a chair (including a wheelchair)?: Total Help needed standing up from a chair using your arms (e.g., wheelchair or bedside chair)?: Total Help needed to walk in hospital room?: Total Help needed climbing 3-5 steps with a railing? : Total 6 Click Score: 6    End of Session   Activity Tolerance: Patient limited by fatigue Patient left: in bed;with nursing/sitter in room;with call bell/phone within reach Nurse Communication: Other (comment) (nurse in room) PT Visit Diagnosis: Muscle weakness (generalized) (M62.81);Pain     Time: 8657-8469 PT Time Calculation (min) (ACUTE ONLY): 24 min  Charges:  $Therapeutic Activity: 8-22 mins                     Anise Salvo,  PT Acute Rehab Services Pager [phone] [facility] [phone]     Rayetta Humphrey 04/12/2020, 3:43 PM

## 2020-04-12 NOTE — Progress Notes (Signed)
ECMO PROGRESS NOTE  NAME:  Alex [name], MRN:  [id number], DOB:  Mar 06, 1971, LOS: 34 ADMISSION DATE:  02/21/2020, CONSULTATION DATE: Mar 22, 2020 REFERRING MD: Wynona Neat -LBPCCM, CHIEF COMPLAINT: Respiratory failure requiring ECMO  HPI/course in hospital  50 year old man admitted to hospital 12/28 with 1 week history of dyspnea cough nausea and vomiting.  Initially admitted to [facility] hospital and placed on high flow nasal cannula but rapidly failed and required intubation 12/29.  Persistent hypoxic respiratory failure with PF ratio 55 in spite of 18 of PEEP FiO2 0.1 despite paralytics.  Did not improve with prone ventilation  Cannulated for VV ECMO 12/30 via right IJ crescent cannula.  ECMO circuit was changed on 04/07/2020 Iatrogenic pneumothorax from left subclavian triple-lumen placement  Past Medical History  none  Interim history/subjective:  Down to 5 sweep Still having issues with gastric motility Still gets agitated.  Objective   Blood pressure (!) 126/91, pulse (!) 110, temperature 98.5 F (36.9 C), temperature source Axillary, resp. rate (!) 23, height 5\' 4"  (1.626 m), weight 69 kg, SpO2 100 %.    Vent Mode: PCV FiO2 (%):  [40 %] 40 % Set Rate:  [20 bmp] 20 bmp PEEP:  [10 cmH20] 10 cmH20 Plateau Pressure:  [23 cmH20-24 cmH20] 23 cmH20   Intake/Output Summary (Last 24 hours) at 04/12/2020 0710 Last data filed at 04/12/2020 0600 Gross per 24 hour  Intake 3176.83 ml  Output 2100 ml  Net 1076.83 ml   Filed Weights   04/10/20 0406 04/11/20 0700 04/12/20 0600  Weight: 65.7 kg 66.7 kg 69 kg    Examination:   Constitutional: ill appearing man in NAD  Eyes: EOMI, pupils equal Ears, nose, mouth, and throat: trach in place, small thick secretions Cardiovascular: Tachycardic, ext warm Respiratory: Diminished bases, triggering vent Gastrointestinal: hypoactive BS, soft Skin: No rashes, normal turgor Neurologic: moves all 4 ext to command Psychiatric: RASS  -1 Circuit: no clots in oxygenator, good color change   ABG mild resp insufficiency BMP with hypernatremia CBG better since TF restarted WBC mildly up Plts stable Afebrile CXR bilateral infiltrates quest developing R effusion  Assessment & Plan:  Acute hypoxemic/hypercapnic respiratory failure due to severe ARDS from COVID-19 pneumonia Probable acute PE, and RV dysfunction s/p TPA Refractory coughing Strep group F/enterococcal pneumonia- s/p 10 days vanc 1/29 - Continue VV ECMO support, sweep weans as able - Current cough regimen: pulmicort, cetacaine, dextromethorphan, increase gabapentin - Continue lung protective ventilation - Check R chest w/ 2/29  HTN  - increase coreg to increase fractional flow through ECMO circuit  Acute delirium, with agitation -  RASS goal -1 to 0, currently on klonipin 1mg  qAM 3mg  qHS, versed gtt, PRN, morphine PRN, oxycodone 15mg  q6h, seroquel 200mg  qHS - Worsened delirium causes increased BP/HR, worsened fractional flows and higher sweep needs; need to be very slow/cautious with changes  Gastroparesis with some element ileus: still an issue - Continue reglan, strengthened bowel regimen (miralax, docusate, fiberpack), check EKG  Ischemic changes L hand -wound following - surgical consult for L hand once more stable  Poorly controlled diabetes type II -Continue levemir, SSI  Acute urinary retention Foley Bethanechol  Hypernatremia:  -continue FWF   Daily Goals Checklist  Pain/Anxiety/Delirium protocol (if indicated): on versed infusion now. Off other continuous VAP protocol (if indicated): bundle in place.  DVT prophylaxis: bivalirudin GI prophylaxis: pantoprazole Glucose control: Euglcyemic on combination of SSI and Basal insulin. Mobility/therapy needs: Mobilization as tolerated, appreciate PT help Code Status: Full  code Disposition: ICU.     Patient critically ill due to COVID ARDS Interventions to address this today vent wean,  ECMO wean Risk of deterioration without these interventions is high  I personally spent 35 minutes providing critical care not including any separately billable procedures  Myrla Halsted MD [facility] 04/12/2020 7:23 AM Prefer epic messenger for cross cover needs If after hours, please call E-link

## 2020-04-12 NOTE — Plan of Care (Signed)
  Problem: Clinical Measurements: Goal: Respiratory complications will improve Outcome: Progressing Note: Frequency of coughing episodes decreased    Problem: Activity: Goal: Risk for activity intolerance will decrease Outcome: Progressing Note: Sat on edge of bed x with PT/OT   Problem: Coping: Goal: Level of anxiety will decrease Outcome: Progressing   Problem: Pain Managment: Goal: General experience of comfort will improve Outcome: Progressing   Problem: Elimination: Goal: Will not experience complications related to bowel motility Outcome: Progressing

## 2020-04-12 NOTE — Progress Notes (Signed)
Occupational Therapy Treatment Patient Details Name: Alex [name] MRN: [id number] DOB: 19-May-1970 Today's Date: 04/12/2020    History of present illness 50 y.o. male  with no significant past medical history admitted on 03/07/2020 with dyspnea, cough, nausea/vomiting ~ 1 week ago with worsening symptoms of body aches and fatigue and +COVID 02/07/20 and admitted with shortness of breath. Required intubation 03/10/20. Cannulated for VV ECMO 02/27/2020. Oxygenating better with ECMO. Also evidence of RLE DVT, RV thrombus, and high suspicion of PE. Has required chest tube to L lung for collapse which needs further reposition with recurrent collapse. Extubated 03-15-19.  Reintubated 1/5 and trach placed 1/6.   OT comments  This 50 yo male admitted with above presents to acute OT with being able to tolerate sitting up on EOB for 5 minutes today (may have been first time sitting on EOB, if not then has been a while since he was able to). 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 Resting hand splint applied and RN to remove in 2 hours with a 2 hour on/2 hour off schedule). He will continue to benefit from acute OT with follow up on CIR.   Follow Up Recommendations  CIR;Supervision/Assistance - 24 hour    Equipment Recommendations  Other (comment) (TBD at next venue)       Precautions / Restrictions Precautions Precautions: Fall Precaution Comments: ECMO,rectal tube, Foley Required Braces or Orthoses: Splint/Cast Splint/Cast: LUE resting hand splint applied with instruction to RN to do 2 hours on 2 hours off. Splint/Cast - Date Prophylactic Dressing Applied (if applicable): 04/12/20 Restrictions Weight Bearing Restrictions: No       Mobility Bed Mobility Overal bed mobility: Needs Assistance Bed Mobility: Supine to Sit;Sit to Supine   Sidelying to sit: Total assist;+2 for physical assistance Supine to sit: Total  assist;+2 for physical assistance     General bed mobility comments: A for legs and trunk  Transfers                      Balance Overall balance assessment: Needs assistance Sitting-balance support: No upper extremity supported;Feet supported Sitting balance-Leahy Scale: Poor Sitting balance - Comments: varied from min A-mod A with left lateral lean as time progressed during 5 minutes he tolerated sitting EOB. Postural control: Left lateral lean                                 ADL either performed or assessed with clinical judgement   ADL Overall ADL's : Needs assistance/impaired                                       General ADL Comments: total A for all basic ADls     Vision   Additional Comments: Pt has difficulty with visual regard when trying to find people in room but pt still with confusion as well with decreased command following          Cognition Arousal/Alertness: Awake/alert Behavior During Therapy: Flat affect Overall Cognitive Status: Difficult to assess                                 General Comments: Spontaneously tried to start to sit up (  used right hand on rail to pull self forward) before we were ready for him to start to sit up. Then when we were ready he needed increased A to sit up. Followed one command while seated EOB (to touch my hand with his RUE--it was more that he opened his hand than reached up to touch my hand). RN reports shortly before we arrived pt took [place] in right hand and suctioned his mouth.        Exercises Other Exercises Other Exercises: LUE resting hand splint fitted to pt's left hand. Unable to secure thumb strap nor grey strap due to bandages on pt's hand. Asked RN that when they change dressing next time to do as little as possible to see if we can get splint to fit better.      General Comments pt on vent with FIo2 40% and 10 PEEP    Pertinent Vitals/ Pain       Pain  Assessment: Faces Faces Pain Scale: Hurts little more Pain Location: generalized- more centralized to coughing Pain Descriptors / Indicators: Grimacing;Guarding;Discomfort Pain Intervention(s): Limited activity within patient's tolerance;Repositioned         Frequency  Min 2X/week        Progress Toward Goals  OT Goals(current goals can now be found in the care plan section)  Progress towards OT goals: Progressing toward goals (able to sit on EOB today)  Acute Rehab OT Goals Patient Stated Goal: unable to state OT Goal Formulation: Patient unable to participate in goal setting Time For Goal Achievement: 04/16/20 Potential to Achieve Goals: Fair  Plan Discharge plan remains appropriate    Co-evaluation    PT/OT/SLP Co-Evaluation/Treatment: Yes Reason for Co-Treatment: Complexity of the patient's impairments (multi-system involvement);For patient/therapist safety PT goals addressed during session: Mobility/safety with mobility;Balance;Strengthening/ROM OT goals addressed during session: Strengthening/ROM      AM-PAC OT "6 Clicks" Daily Activity     Outcome Measure   Help from another person eating meals?: Total Help from another person taking care of personal grooming?: Total Help from another person toileting, which includes using toliet, bedpan, or urinal?: Total Help from another person bathing (including washing, rinsing, drying)?: Total Help from another person to put on and taking off regular upper body clothing?: Total Help from another person to put on and taking off regular lower body clothing?: Total 6 Click Score: 6    End of Session Equipment Utilized During Treatment: Oxygen (vent Fio2 40%)  OT Visit Diagnosis: Muscle weakness (generalized) (M62.81);Other symptoms and signs involving cognitive function;Other abnormalities of gait and mobility (R26.89)   Activity Tolerance Patient tolerated treatment well   Patient Left in bed;with call bell/phone  within reach;with bed alarm set   Nurse Communication  (RNs in with [place] whole time to A with lines)        Time: 7622-6333 OT Time Calculation (min): 25 min  Charges: OT General Charges $OT Visit: 1 Visit OT Treatments $Therapeutic Activity: 8-22 mins  Ignacia Palma, OTR/L Acute [employer] Pager [phone] Office [phone]      Evette Georges 04/12/2020, 3:28 PM

## 2020-04-12 NOTE — Progress Notes (Signed)
ANTICOAGULATION CONSULT NOTE  Pharmacy Consult for bivalirudin Indication: ECMO and VTE  Labs: Recent Labs    04/10/20 0355 04/10/20 0357 04/11/20 0403 04/11/20 0408 04/11/20 1610 04/11/20 1616 04/12/20 0417 04/12/20 0754 04/12/20 1409 04/12/20 1647 04/12/20 1700  HGB 9.0*   < > 8.7*   < > 8.6*   < > 8.3*   < > 8.5* 9.2* 8.9*  HCT 28.9*   < > 29.2*   < > 27.8*   < > 27.8*   < > 25.0* 27.0* 29.4*  PLT 309   < > 309  --  313  --  308  --   --   --  328  APTT 79*   < > 73*  --  78*  --  72*  --   --   --  73*  LABPROT 17.5*  --  17.8*  --   --   --  17.0*  --   --   --   --   INR 1.5*  --  1.5*  --   --   --  1.4*  --   --   --   --   CREATININE 0.71   < > 0.72  --  0.61  --  0.67  --   --   --   --    < > = values in this interval not displayed.    Assessment: 53 yoM admitted with COVID-19 PNA with worsening hypoxia, s/p cannulation for ECMO. Pt was started on IV heparin prior to cannulation due to acute DVTs and possible PE, transitioned to bivalirudin with ECMO. Now s/p tPA on 1/5 and tracheostomy on 1/6. Circuit last changed 1/26.  APTT remains therapeutic and stable.  Goal of Therapy:  aPTT 60-80 seconds   Plan:  Continue bivalirudin at 0.08 mg/kg/hr (using order-specific wt 72.1kg) Monitor q12h aPTT/CBC, LDH, and for s/sx of bleeding  Jeanella Cara, PharmD, [facility] Clinical Pharmacist Please see AMION for all Pharmacists' Contact Phone Numbers 04/12/2020, 6:03 PM

## 2020-04-12 NOTE — Progress Notes (Signed)
ANTICOAGULATION CONSULT NOTE  Pharmacy Consult for bivalirudin Indication: ECMO and VTE  Labs: Recent Labs    04/10/20 0355 04/10/20 0357 04/11/20 0403 04/11/20 0408 04/11/20 1610 04/11/20 1616 04/11/20 2009 04/12/20 0410 04/12/20 0417  HGB 9.0*   < > 8.7*   < > 8.6*   < > 8.8* 8.2* 8.3*  HCT 28.9*   < > 29.2*   < > 27.8*   < > 26.0* 24.0* 27.8*  PLT 309   < > 309  --  313  --   --   --  308  APTT 79*   < > 73*  --  78*  --   --   --  72*  LABPROT 17.5*  --  17.8*  --   --   --   --   --  17.0*  INR 1.5*  --  1.5*  --   --   --   --   --  1.4*  CREATININE 0.71   < > 0.72  --  0.61  --   --   --  0.67   < > = values in this interval not displayed.    Assessment: 86 yoM admitted with COVID-19 PNA with worsening hypoxia, s/p cannulation for ECMO. Pt was started on IV heparin prior to cannulation due to acute DVTs and possible PE, transitioned to bivalirudin with ECMO. Now s/p tPA on 1/5 and tracheostomy on 1/6. Circuit last changed 1/26.  APTT remains therapeutic and stable.  Goal of Therapy:  aPTT 60-80 seconds   Plan:  Continue bivalirudin at 0.08 mg/kg/hr (using order-specific wt 72.1kg) Monitor q12h aPTT/CBC, LDH, and for s/sx of bleeding  Harland German, PharmD Clinical Pharmacist **Pharmacist phone directory can now be found on [email or web address] ([email or web address]).  Listed under [facility].

## 2020-04-12 NOTE — Progress Notes (Signed)
Patient ID: Alex [name], male   DOB: 27-Feb-1971, 50 y.o.   MRN: [id number]     Advanced Heart Failure Rounding Note  PCP-Cardiologist: No primary care provider on file.   Subjective:    - 12/30: VV ECMO cannulation - 12/31: Left chest tube replaced - 1/2: Extubated. Echo with EF 60-65%, mildly dilated RV with mildly decreased systolic function.  - 1/4: Agitated, suspected aspiration.  Re-intubated.  - 1/5: ECMO cannula repositioned under TEE guidance. TEE showed moderately dilated/moderate-severely dysfunctional RV in setting of hypoxemia. LUE DVT found.  Patient got 1/2 dose of TPA due to initial concern for large PE.  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  - 1/6: Tracheostomy - 1/7: Echo with mild RV dilation/mild RV dysfunction.  - 1/16: Left chest tube out - 1/17: LUE arterial dopplers repeated, showed no obstruction.  - 1/20: Echo with EF 65-70%, mildly D-shaped septum, mildly dilated and mildly dysfunctional RV.  - 1/22: CTA chest: Bilateral upper lobe PEs (suspect chronic), changes c/w ARDS - 1/23: Bronchoscopy showed semi-occlusive ?mass/polyp in the trachea.  - 1/24: Bronchoscopy showed resolution of 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 - 1/26: ECMO circuit changed.   Sweep down to 5 today, pH 7.33 this morning. HCO3 25.  Now off acetazolamide.  Did not get Lasix yesterday, weight up.   Cough improved with cetacaine and gabapentin.   Stood in bed x 30 mins, required albumin with chugging while standing.  He is now off abx.   ECMO parameters: 3300 rpm Flow 4.4 L/min Pvenous -76 Delta P 26 Sweep 5 FiO2 0.4 TV 280-300 cc  ABG 7.33/51/90/96% LDH  1,117 => 911 => 751 => 699 => 691 => 534 => 488 => 547 => 461 => 474 => 489 PTT 72 Lactate 1.0  Objective:   Weight Range: 69 kg Body mass index is 26.11 kg/m.   Vital Signs:   Temp:  [97.9 F (36.6 C)-98.5 F (36.9 C)] 98.5 F (36.9 C) (01/31 0400) Pulse Rate:  [45-121] 110 (01/31 0700) Resp:  [0-42] 23  (01/31 0700) BP: (92-172)/(45-108) 126/91 (01/31 0700) SpO2:  [93 %-100 %] 100 % (01/31 0700) Arterial Line BP: (113-223)/(49-108) 156/76 (01/31 0700) FiO2 (%):  [40 %] 40 % (01/31 0408) Weight:  [69 kg] 69 kg (01/31 0600) Last BM Date: 04/11/20  Weight change: Filed Weights   04/10/20 0406 04/11/20 0700 04/12/20 0600  Weight: 65.7 kg 66.7 kg 69 kg    Intake/Output:   Intake/Output Summary (Last 24 hours) at 04/12/2020 0734 Last data filed at 04/12/2020 0600 Gross per 24 hour  Intake 3176.83 ml  Output 2100 ml  Net 1076.83 ml      Physical Exam    General: Awake Neck: Tracheostomy. No JVD, no thyromegaly or thyroid nodule.  Lungs: Decreased at bases.  CV: Nondisplaced PMI.  Heart regular S1/S2, no S3/S4, no murmur.  No peripheral edema.   Abdomen: Soft, nontender, no hepatosplenomegaly, no distention.  Skin: Intact without lesions or rashes.  Neurologic: Follow commands.  Extremities: No clubbing or cyanosis.  HEENT: Normal.    Telemetry   NSR 110s Personally reviewed   Labs    CBC Recent Labs    04/11/20 1610 04/11/20 1616 04/12/20 0410 04/12/20 0417  WBC 12.3*  --   --  15.7*  HGB 8.6*   < > 8.2* 8.3*  HCT 27.8*   < > 24.0* 27.8*  MCV 98.6  --   --  100.0  PLT 313  --   --  308   < > = values in this interval not displayed.   Basic Metabolic Panel Recent Labs    04/11/20 1610 04/11/20 1616 04/12/20 0410 04/12/20 0417  NA 148*   < > 149* 146*  K 3.8   < > 4.1 4.1  CL 115*  --   --  113*  CO2 24  --   --  25  GLUCOSE 48*  --   --  200*  BUN 47*  --   --  47*  CREATININE 0.61  --   --  0.67  CALCIUM 9.0  --   --  9.1   < > = values in this interval not displayed.   Liver Function Tests Recent Labs    04/11/20 0403  AST 19  ALT 32  ALKPHOS 143*  BILITOT 0.8  PROT 5.9*  ALBUMIN 2.9*   No results for input(s): LIPASE, AMYLASE in the last 72 hours. Cardiac Enzymes No results for input(s): CKTOTAL, CKMB, CKMBINDEX, TROPONINI in the last  72 hours.  BNP: BNP (last 3 results) No results for input(s): BNP in the last 8760 hours.  ProBNP (last 3 results) No results for input(s): PROBNP in the last 8760 hours.   D-Dimer No results for input(s): DDIMER in the last 72 hours. Hemoglobin A1C No results for input(s): HGBA1C in the last 72 hours. Fasting Lipid Panel No results for input(s): CHOL, HDL, LDLCALC, TRIG, CHOLHDL, LDLDIRECT in the last 72 hours. Thyroid Function Tests No results for input(s): TSH, T4TOTAL, T3FREE, THYROIDAB in the last 72 hours.  Invalid input(s): FREET3  Other results:   Imaging    [place] EKG SITE RITE  Result Date: 04/11/2020 If Site Rite image not attached, placement could not be confirmed due to current cardiac rhythm.    Medications:     Scheduled Medications: . budesonide (PULMICORT) nebulizer solution  0.5 mg Nebulization BID  . carvedilol  25 mg Oral BID WC  . chlorhexidine gluconate (MEDLINE KIT)  15 mL Mouth Rinse BID  . Chlorhexidine Gluconate Cloth  6 each Topical Daily  . chlorpheniramine-HYDROcodone  5 mL Per Tube Q12H  . chlorproMAZINE  25 mg Per Tube TID  . clonazePAM  1 mg Per Tube Daily  . clonazePAM  3 mg Per Tube QHS  . cloNIDine  0.1 mg Per Tube Q8H  . dextromethorphan  30 mg Per Tube TID  . docusate  100 mg Per Tube BID  . fiber  1 packet Per Tube BID  . free water  200 mL Per Tube Q6H  . furosemide  40 mg Intravenous Once  . gabapentin  600 mg Per Tube Q8H  . insulin aspart  0-20 Units Subcutaneous Q4H  . insulin aspart  4 Units Subcutaneous Q4H  . insulin detemir  40 Units Subcutaneous BID  . ipratropium-albuterol  3 mL Nebulization Q6H  . lactobacillus acidophilus  2 tablet Per Tube TID  . liver oil-zinc oxide   Topical 5 X Daily  . mouth rinse  15 mL Mouth Rinse 10 times per day  . melatonin  10 mg Per Tube QHS  . metoCLOPramide (REGLAN) injection  10 mg Intravenous Q8H  . nitroGLYCERIN  0.5 inch Topical Q6H  . nutrition supplement (JUVEN)  1 packet  Per Tube BID BM  . nystatin  5 mL Mouth/Throat QID  . oxyCODONE  15 mg Per Tube Q6H  . pantoprazole sodium  40 mg Per Tube QHS  . QUEtiapine  200 mg Per Tube QHS  .  sennosides  10 mL Per Tube QHS  . sodium chloride flush  10-40 mL Intracatheter Q12H  . valproic acid  125 mg Per Tube QHS    Infusions: . sodium chloride    . sodium chloride 10 mL/hr at 04/11/20 2022  . sodium chloride 250 mL (04/07/20 2203)  . sodium chloride Stopped (03/12/20 0131)  . albumin human 12.5 g (04/11/20 1558)  . bivalirudin (ANGIOMAX) infusion 0.5 mg/mL (Non-ACS indications) 0.08 mg/kg/hr (04/11/20 1251)  . feeding supplement (PIVOT 1.5 CAL) 1,000 mL (04/12/20 0643)  . midazolam 1 mg/hr (04/12/20 0600)    PRN Medications: Place/Maintain arterial line **AND** sodium chloride, sodium chloride, sodium chloride, acetaminophen (TYLENOL) oral liquid 160 mg/5 mL, albumin human, [DISCONTINUED] lidocaine **AND** albuterol, butamben-tetracaine-benzocaine, guaiFENesin, hydrALAZINE, labetalol, lip balm, midazolam, morphine CONCENTRATE, neomycin-bacitracin-polymyxin, ondansetron (ZOFRAN) IV, phenol, polyethylene glycol, simethicone, sodium chloride flush   Assessment/Plan   1. Acute hypoxemic respiratory failure: Due to COVID-19 PNA with bilateral infiltrates.  Refractory hypoxemia, VV-ECMO cannulation on 02/19/2020 with improvement in oxygenation.  Developed left PTX post-subclavian CVL and had left chest tube, the left lung is re-expanded and CT out.  He was extubated 1/2 but reintubated 1/4 with agitation and suspected aspiration.  Tracheostomy 1/6.  CTA chest 1/22 with suspected chronic PEs and ARDS. ECMO cannula repositioned 1/5. ECMO circuit changed 1/26. LDH stable today. Sweep at 5, have struggled with hypercarbia from significant dead space ventilation.  Now off abx. PCO2 51. Bcarb down to 25.  I/Os positive and weight up, no diuretics yesterday.  - Slowly wean sweep, may be able to tolerate down to 4 today.  - Will  give a dose of Lasix 40 mg IV x 1 today and follow response.   - Consider repeat chest CT later this week to look for progressive fibrosis/architectural changes - Awake this morning, off Versed.   - Patient has had remdesivir, tocilizumab. - Completed steroid taper. - Continue bivalirudin, goal PTT 65-80. He is at 23 today. Discussed dosing with PharmD personally.  - Continue to mobilize  - Continue Delsym, cetacaine and gabapentin for cough. Seems improved.  2. 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  Clot noted on TEE in RV as well.  TTE 1/2 showed normal EF 60-65%, RV improved (mildly dilated/dysfunctional). TEE on 1/5 with moderate to severe RV dysfunction but patient was hypoxemic.  Had 1/2 dose TPA on 1/5. Echo 1/20 with mildly dilated/mildly dysfunctional RV. CTA chest 1/22 with chronic-appearing PEs in upper lobes. - Bivalirudin for goal PTT 65-80.  He is 74 today. Discussed dosing with PharmD personally. 3. Left PTX: Left chest tube, lung is re-expanded. Tube now out, stable CXR. 4. Shock: Suspect septic/distributive.  Now resolved, off NE.  5. Anemia: Hgb 8.3 transfuse < 8.   6. AKI: Resolved.  7. Hyperglycemia: insulin.  8. HTN: BP stable.   - On clonidine.  - Continue Coreg 12.5 mg bid.   9. CHB: Episode of CHB when hypoxemic and with cough (suspect vagal).  NSR since then.    - Continue Coreg, watch rhythm.  10. Thrombocytopenia: Resolved  11. Ileus: Resolved. TFs restarted.  - Getting Reglan  - Cor-trak repositioned to post-pyloric placement 12. Ischemic digits: LUE.  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  Repeat study 1/17 showed no obstruction.  - Wound Care following. 13. ID: Group F Strep in sputum.  Also with Enterococcus faecalis in sputum. Completed abx.  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   15. Hypernatremia: Na decreased with increased free water.   CRITICAL CARE Performed by: Loralie Champagne  Total critical care time: 35 minutes  Critical care time was exclusive of separately billable procedures and treating other patients.  Critical care was necessary to treat or prevent imminent or life-threatening deterioration.  Critical care was time spent personally by me on the following activities: development of treatment plan with patient and/or surrogate as well as nursing, discussions with consultants, evaluation of patient's response to treatment, examination of patient, obtaining history from patient or surrogate, ordering and performing treatments and interventions, ordering and review of laboratory studies, ordering and review of radiographic studies, pulse oximetry and re-evaluation of patient's condition.    Length of Stay: Reynolds, MD  04/12/2020, 7:34 AM  Advanced Heart Failure Team Pager [phone] (M-F; 7a - 4p)  Please contact [facility] for night-coverage after hours (4p -7a ) and weekends on [email or web address]

## 2020-04-13 ENCOUNTER — Inpatient Hospital Stay (HOSPITAL_COMMUNITY): Payer: Medicaid Other

## 2020-04-13 LAB — CBC
HCT: 27.1 % — ABNORMAL LOW (ref 39.0–52.0)
HCT: 28.2 % — ABNORMAL LOW (ref 39.0–52.0)
Hemoglobin: 8.4 g/dL — ABNORMAL LOW (ref 13.0–17.0)
Hemoglobin: 9 g/dL — ABNORMAL LOW (ref 13.0–17.0)
MCH: 30.2 pg (ref 26.0–34.0)
MCH: 31.1 pg (ref 26.0–34.0)
MCHC: 31 g/dL (ref 30.0–36.0)
MCHC: 31.9 g/dL (ref 30.0–36.0)
MCV: 97.5 fL (ref 80.0–100.0)
MCV: 97.6 fL (ref 80.0–100.0)
Platelets: 302 10*3/uL (ref 150–400)
Platelets: 316 10*3/uL (ref 150–400)
RBC: 2.78 MIL/uL — ABNORMAL LOW (ref 4.22–5.81)
RBC: 2.89 MIL/uL — ABNORMAL LOW (ref 4.22–5.81)
RDW: 16.2 % — ABNORMAL HIGH (ref 11.5–15.5)
RDW: 16.5 % — ABNORMAL HIGH (ref 11.5–15.5)
WBC: 19.3 10*3/uL — ABNORMAL HIGH (ref 4.0–10.5)
WBC: 26.4 10*3/uL — ABNORMAL HIGH (ref 4.0–10.5)
nRBC: 0.3 % — ABNORMAL HIGH (ref 0.0–0.2)
nRBC: 0.2 % (ref 0.0–0.2)

## 2020-04-13 LAB — BASIC METABOLIC PANEL
Anion gap: 9 (ref 5–15)
Anion gap: 10 (ref 5–15)
BUN: 42 mg/dL — ABNORMAL HIGH (ref 6–20)
BUN: 54 mg/dL — ABNORMAL HIGH (ref 6–20)
CO2: 30 mmol/L (ref 22–32)
CO2: 30 mmol/L (ref 22–32)
Calcium: 9 mg/dL (ref 8.9–10.3)
Calcium: 9 mg/dL (ref 8.9–10.3)
Chloride: 107 mmol/L (ref 98–111)
Chloride: 106 mmol/L (ref 98–111)
Creatinine, Ser: 0.53 mg/dL — ABNORMAL LOW (ref 0.61–1.24)
Creatinine, Ser: 0.57 mg/dL — ABNORMAL LOW (ref 0.61–1.24)
GFR, Estimated: >60 mL/min (ref >60)
GFR, Estimated: >60 mL/min (ref >60)
Glucose, Bld: 114 mg/dL — ABNORMAL HIGH (ref 70–99)
Glucose, Bld: 105 mg/dL — ABNORMAL HIGH (ref 70–99)
Potassium: 3.9 mmol/L (ref 3.5–5.1)
Potassium: 3.4 mmol/L — ABNORMAL LOW (ref 3.5–5.1)
Sodium: 146 mmol/L — ABNORMAL HIGH (ref 135–145)
Sodium: 146 mmol/L — ABNORMAL HIGH (ref 135–145)

## 2020-04-13 LAB — LACTIC ACID, PLASMA
Lactic Acid, Venous: 0.6 mmol/L (ref 0.5–1.9)
Lactic Acid, Venous: 0.6 mmol/L (ref 0.5–1.9)

## 2020-04-13 LAB — POCT I-STAT 7, (LYTES, BLD GAS, ICA,H+H)
Acid-Base Excess: 7 mmol/L — ABNORMAL HIGH (ref 0.0–2.0)
Acid-Base Excess: 8 mmol/L — ABNORMAL HIGH (ref 0.0–2.0)
Acid-Base Excess: 8 mmol/L — ABNORMAL HIGH (ref 0.0–2.0)
Acid-Base Excess: 7 mmol/L — ABNORMAL HIGH (ref 0.0–2.0)
Acid-Base Excess: 7 mmol/L — ABNORMAL HIGH (ref 0.0–2.0)
Acid-Base Excess: 9 mmol/L — ABNORMAL HIGH (ref 0.0–2.0)
Bicarbonate: 33.4 mmol/L — ABNORMAL HIGH (ref 20.0–28.0)
Bicarbonate: 33.9 mmol/L — ABNORMAL HIGH (ref 20.0–28.0)
Bicarbonate: 34.1 mmol/L — ABNORMAL HIGH (ref 20.0–28.0)
Bicarbonate: 33.4 mmol/L — ABNORMAL HIGH (ref 20.0–28.0)
Bicarbonate: 33.9 mmol/L — ABNORMAL HIGH (ref 20.0–28.0)
Bicarbonate: 35.5 mmol/L — ABNORMAL HIGH (ref 20.0–28.0)
Calcium, Ion: 1.22 mmol/L (ref 1.15–1.40)
Calcium, Ion: 1.29 mmol/L (ref 1.15–1.40)
Calcium, Ion: 1.28 mmol/L (ref 1.15–1.40)
Calcium, Ion: 1.29 mmol/L (ref 1.15–1.40)
Calcium, Ion: 1.3 mmol/L (ref 1.15–1.40)
Calcium, Ion: 1.25 mmol/L (ref 1.15–1.40)
HCT: 25 % — ABNORMAL LOW (ref 39.0–52.0)
HCT: 26 % — ABNORMAL LOW (ref 39.0–52.0)
HCT: 27 % — ABNORMAL LOW (ref 39.0–52.0)
HCT: 27 % — ABNORMAL LOW (ref 39.0–52.0)
HCT: 27 % — ABNORMAL LOW (ref 39.0–52.0)
HCT: 26 % — ABNORMAL LOW (ref 39.0–52.0)
Hemoglobin: 8.5 g/dL — ABNORMAL LOW (ref 13.0–17.0)
Hemoglobin: 8.8 g/dL — ABNORMAL LOW (ref 13.0–17.0)
Hemoglobin: 9.2 g/dL — ABNORMAL LOW (ref 13.0–17.0)
Hemoglobin: 9.2 g/dL — ABNORMAL LOW (ref 13.0–17.0)
Hemoglobin: 9.2 g/dL — ABNORMAL LOW (ref 13.0–17.0)
Hemoglobin: 8.8 g/dL — ABNORMAL LOW (ref 13.0–17.0)
O2 Saturation: 95 %
O2 Saturation: 97 %
O2 Saturation: 94 %
O2 Saturation: 95 %
O2 Saturation: 92 %
O2 Saturation: 96 %
Patient temperature: 37
Patient temperature: 37.2
Patient temperature: 36.9
Patient temperature: 36.8
Patient temperature: 36.9
Patient temperature: 37
Potassium: 3.9 mmol/L (ref 3.5–5.1)
Potassium: 4.6 mmol/L (ref 3.5–5.1)
Potassium: 3.9 mmol/L (ref 3.5–5.1)
Potassium: 3.6 mmol/L (ref 3.5–5.1)
Potassium: 3.4 mmol/L — ABNORMAL LOW (ref 3.5–5.1)
Potassium: 3.9 mmol/L (ref 3.5–5.1)
Sodium: 148 mmol/L — ABNORMAL HIGH (ref 135–145)
Sodium: 148 mmol/L — ABNORMAL HIGH (ref 135–145)
Sodium: 146 mmol/L — ABNORMAL HIGH (ref 135–145)
Sodium: 149 mmol/L — ABNORMAL HIGH (ref 135–145)
Sodium: 149 mmol/L — ABNORMAL HIGH (ref 135–145)
Sodium: 147 mmol/L — ABNORMAL HIGH (ref 135–145)
TCO2: 35 mmol/L — ABNORMAL HIGH (ref 22–32)
TCO2: 36 mmol/L — ABNORMAL HIGH (ref 22–32)
TCO2: 36 mmol/L — ABNORMAL HIGH (ref 22–32)
TCO2: 35 mmol/L — ABNORMAL HIGH (ref 22–32)
TCO2: 36 mmol/L — ABNORMAL HIGH (ref 22–32)
TCO2: 37 mmol/L — ABNORMAL HIGH (ref 22–32)
pCO2 arterial: 55.3 mmHg — ABNORMAL HIGH (ref 32.0–48.0)
pCO2 arterial: 57.3 mmHg — ABNORMAL HIGH (ref 32.0–48.0)
pCO2 arterial: 56.2 mmHg — ABNORMAL HIGH (ref 32.0–48.0)
pCO2 arterial: 58.5 mmHg — ABNORMAL HIGH (ref 32.0–48.0)
pCO2 arterial: 60.8 mmHg — ABNORMAL HIGH (ref 32.0–48.0)
pCO2 arterial: 60.6 mmHg — ABNORMAL HIGH (ref 32.0–48.0)
pH, Arterial: 7.388 (ref 7.350–7.450)
pH, Arterial: 7.382 (ref 7.350–7.450)
pH, Arterial: 7.39 (ref 7.350–7.450)
pH, Arterial: 7.364 (ref 7.350–7.450)
pH, Arterial: 7.354 (ref 7.350–7.450)
pH, Arterial: 7.375 (ref 7.350–7.450)
pO2, Arterial: 81 mmHg — ABNORMAL LOW (ref 83.0–108.0)
pO2, Arterial: 93 mmHg (ref 83.0–108.0)
pO2, Arterial: 75 mmHg — ABNORMAL LOW (ref 83.0–108.0)
pO2, Arterial: 80 mmHg — ABNORMAL LOW (ref 83.0–108.0)
pO2, Arterial: 70 mmHg — ABNORMAL LOW (ref 83.0–108.0)
pO2, Arterial: 88 mmHg (ref 83.0–108.0)

## 2020-04-13 LAB — PROCALCITONIN: Procalcitonin: 0.42 ng/mL

## 2020-04-13 LAB — COOXEMETRY PANEL
Carboxyhemoglobin: 2.5 % — ABNORMAL HIGH (ref 0.5–1.5)
Methemoglobin: 0.9 % (ref 0.0–1.5)
O2 Saturation: 71.5 %
Total hemoglobin: 8.1 g/dL — ABNORMAL LOW (ref 12.0–16.0)

## 2020-04-13 LAB — FIBRINOGEN: Fibrinogen: 582 mg/dL — ABNORMAL HIGH (ref 210–475)

## 2020-04-13 LAB — PROTIME-INR
INR: 1.5 — ABNORMAL HIGH (ref 0.8–1.2)
Prothrombin Time: 17.4 seconds — ABNORMAL HIGH (ref 11.4–15.2)

## 2020-04-13 LAB — GLUCOSE, CAPILLARY
Glucose-Capillary: 104 mg/dL — ABNORMAL HIGH (ref 70–99)
Glucose-Capillary: 86 mg/dL (ref 70–99)
Glucose-Capillary: 141 mg/dL — ABNORMAL HIGH (ref 70–99)
Glucose-Capillary: 106 mg/dL — ABNORMAL HIGH (ref 70–99)
Glucose-Capillary: 93 mg/dL (ref 70–99)

## 2020-04-13 LAB — APTT
aPTT: 74 seconds — ABNORMAL HIGH (ref 24–36)
aPTT: 78 seconds — ABNORMAL HIGH (ref 24–36)

## 2020-04-13 LAB — LACTATE DEHYDROGENASE: LDH: 553 U/L — ABNORMAL HIGH (ref 98–192)

## 2020-04-13 MED ORDER — ETOMIDATE 2 MG/ML IV SOLN
20.0000 mg | Freq: Once | INTRAVENOUS | Status: AC
  Administered 2020-04-13: 20 mg via INTRAVENOUS
  Filled 2020-04-13: qty 10

## 2020-04-13 MED ORDER — ALBUMIN HUMAN 5 % IV SOLN
INTRAVENOUS | Status: AC
  Filled 2020-04-13: qty 250

## 2020-04-13 MED ORDER — MELATONIN 3 MG PO TABS
3.0000 mg | ORAL_TABLET | Freq: Every day | ORAL | Status: DC
  Administered 2020-04-13 – 2020-04-21 (×9): 3 mg
  Filled 2020-04-13 (×9): qty 1

## 2020-04-13 MED ORDER — ACETAZOLAMIDE 250 MG PO TABS
250.0000 mg | ORAL_TABLET | Freq: Once | ORAL | Status: AC
  Administered 2020-04-13: 250 mg via ORAL
  Filled 2020-04-13: qty 1

## 2020-04-13 MED ORDER — INSULIN DETEMIR 100 UNIT/ML ~~LOC~~ SOLN
35.0000 [IU] | Freq: Two times a day (BID) | SUBCUTANEOUS | Status: DC
  Administered 2020-04-13 (×2): 35 [IU] via SUBCUTANEOUS
  Filled 2020-04-13 (×4): qty 0.35

## 2020-04-13 MED ORDER — ACETAZOLAMIDE 250 MG PO TABS
500.0000 mg | ORAL_TABLET | Freq: Once | ORAL | Status: DC
  Filled 2020-04-13: qty 2

## 2020-04-13 MED ORDER — FUROSEMIDE 10 MG/ML IJ SOLN
40.0000 mg | Freq: Two times a day (BID) | INTRAMUSCULAR | Status: AC
  Administered 2020-04-13 (×2): 40 mg via INTRAVENOUS
  Filled 2020-04-13 (×2): qty 4

## 2020-04-13 MED ORDER — FREE WATER
200.0000 mL | Status: DC
  Administered 2020-04-13 – 2020-04-25 (×69): 200 mL

## 2020-04-13 MED ORDER — EPINEPHRINE 1 MG/10ML IJ SOSY
PREFILLED_SYRINGE | INTRAMUSCULAR | Status: AC
  Filled 2020-04-13: qty 20

## 2020-04-13 MED ORDER — PHENYLEPHRINE 40 MCG/ML (10ML) SYRINGE FOR IV PUSH (FOR BLOOD PRESSURE SUPPORT)
PREFILLED_SYRINGE | INTRAVENOUS | Status: AC
  Filled 2020-04-13: qty 10

## 2020-04-13 MED ORDER — METOLAZONE 2.5 MG PO TABS: 2.5000 mg | ORAL_TABLET | Freq: Once | ORAL | Status: DC

## 2020-04-13 MED ORDER — CALCIUM CHLORIDE 10 % IV SOLN
INTRAVENOUS | Status: AC
  Filled 2020-04-13: qty 10

## 2020-04-13 MED ORDER — SODIUM BICARBONATE 8.4 % IV SOLN
INTRAVENOUS | Status: AC
  Filled 2020-04-13: qty 50

## 2020-04-13 MED ORDER — MIDAZOLAM BOLUS VIA INFUSION
1.0000 mg | INTRAVENOUS | Status: DC | PRN
  Filled 2020-04-13: qty 4

## 2020-04-13 MED ORDER — POTASSIUM CHLORIDE 20 MEQ PO PACK
40.0000 meq | PACK | Freq: Once | ORAL | Status: AC
  Administered 2020-04-13: 40 meq
  Filled 2020-04-13: qty 2

## 2020-04-13 MED ORDER — FUROSEMIDE 10 MG/ML IJ SOLN: 40.0000 mg | Freq: Once | INTRAMUSCULAR | Status: DC

## 2020-04-13 MED ORDER — QUETIAPINE FUMARATE 100 MG PO TABS
300.0000 mg | ORAL_TABLET | Freq: Every day | ORAL | Status: DC
  Administered 2020-04-13 – 2020-04-14 (×2): 300 mg
  Filled 2020-04-13 (×3): qty 3

## 2020-04-13 NOTE — Progress Notes (Signed)
Patient ID: Alex [name], male   DOB: Nov 29, 1970, 50 y.o.   MRN: [id number] Patient ID: Alex [name], male   DOB: Sep 10, 1970, 50 y.o.   MRN: [id number]     Advanced Heart Failure Rounding Note  PCP-Cardiologist: No primary care provider on file.   Subjective:    - 12/30: VV ECMO cannulation - 12/31: Left chest tube replaced - 1/2: Extubated. Echo with EF 60-65%, mildly dilated RV with mildly decreased systolic function.  - 1/4: Agitated, suspected aspiration.  Re-intubated.  - 1/5: ECMO cannula repositioned under TEE guidance. TEE showed moderately dilated/moderate-severely dysfunctional RV in setting of hypoxemia. LUE DVT found.  Patient got 1/2 dose of TPA due to initial concern for large PE.  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  - 1/6: Tracheostomy - 1/7: Echo with mild RV dilation/mild RV dysfunction.  - 1/16: Left chest tube out - 1/17: LUE arterial dopplers repeated, showed no obstruction.  - 1/20: Echo with EF 65-70%, mildly D-shaped septum, mildly dilated and mildly dysfunctional RV.  - 1/22: CTA chest: Bilateral upper lobe PEs (suspect chronic), changes c/w ARDS - 1/23: Bronchoscopy showed semi-occlusive ?mass/polyp in the trachea.  - 1/24: Bronchoscopy showed resolution of 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 - 1/26: ECMO circuit changed.   Sweep stable at 5 today, CXR unchanged.  I/Os mildly positive, weight up.   Cough worse this morning, patient tachycardia and uncomfortable.   He is now off abx.   ECMO parameters: 3300 rpm Flow 4.35 L/min Pvenous -74 Delta P 26 Sweep 5 FiO2 0.4 TV 350 cc  ABG 7.39/55/81/95% LDH  1,117 => 911 => 751 => 699 => 691 => 534 => 488 => 547 => 461 => 474 => 489 => 553 PTT 74 Lactate 0.6  Objective:   Weight Range: 69.7 kg Body mass index is 26.38 kg/m.   Vital Signs:   Temp:  [98.4 F (36.9 C)-99 F (37.2 C)] 98.6 F (37 C) (02/01 0400) Pulse Rate:  [88-125] 115 (02/01 0700) Resp:  [13-45] 21 (02/01 0700) BP:  (100-147)/(69-98) 138/91 (02/01 0700) SpO2:  [97 %-100 %] 100 % (02/01 0700) Arterial Line BP: (136-242)/(61-240) 169/68 (02/01 0700) FiO2 (%):  [40 %] 40 % (02/01 0403) Weight:  [69.7 kg] 69.7 kg (02/01 0437) Last BM Date: 04/12/20  Weight change: Filed Weights   04/11/20 0700 04/12/20 0600 04/13/20 0437  Weight: 66.7 kg 69 kg 69.7 kg    Intake/Output:   Intake/Output Summary (Last 24 hours) at 04/13/2020 0738 Last data filed at 04/13/2020 0700 Gross per 24 hour  Intake 4040.25 ml  Output 3560 ml  Net 480.25 ml      Physical Exam    General: Awake with cough Neck: No JVD, no thyromegaly or thyroid nodule.  Lungs: Crackles at bases.  CV: Nondisplaced PMI.  Heart tachy, regular S1/S2, no S3/S4, no murmur.  No peripheral edema.   Abdomen: Soft, nontender, no hepatosplenomegaly, no distention.  Skin: Intact without lesions or rashes.  Neurologic: Alert and oriented x 3.  Psych: Follows commands.  HEENT: Normal.    Telemetry   NSR 120s Personally reviewed   Labs    CBC Recent Labs    04/12/20 1700 04/12/20 1806 04/13/20 0332 04/13/20 0334  WBC 18.8*  --  19.3*  --   HGB 8.9*   < > 8.4* 8.5*  HCT 29.4*   < > 27.1* 25.0*  MCV 98.7  --  97.5  --   PLT 328  --  302  --    < > =  values in this interval not displayed.   Basic Metabolic Panel Recent Labs    04/12/20 1700 04/12/20 1806 04/13/20 0332 04/13/20 0334  NA 145   < > 146* 148*  K 4.4   < > 3.9 3.9  CL 107  --  107  --   CO2 29  --  30  --   GLUCOSE 185*  --  114*  --   BUN 53*  --  42*  --   CREATININE 0.66  --  0.53*  --   CALCIUM 8.7*  --  9.0  --    < > = values in this interval not displayed.   Liver Function Tests Recent Labs    04/11/20 0403  AST 19  ALT 32  ALKPHOS 143*  BILITOT 0.8  PROT 5.9*  ALBUMIN 2.9*   No results for input(s): LIPASE, AMYLASE in the last 72 hours. Cardiac Enzymes No results for input(s): CKTOTAL, CKMB, CKMBINDEX, TROPONINI in the last 72  hours.  BNP: BNP (last 3 results) No results for input(s): BNP in the last 8760 hours.  ProBNP (last 3 results) No results for input(s): PROBNP in the last 8760 hours.   D-Dimer No results for input(s): DDIMER in the last 72 hours. Hemoglobin A1C No results for input(s): HGBA1C in the last 72 hours. Fasting Lipid Panel No results for input(s): CHOL, HDL, LDLCALC, TRIG, CHOLHDL, LDLDIRECT in the last 72 hours. Thyroid Function Tests No results for input(s): TSH, T4TOTAL, T3FREE, THYROIDAB in the last 72 hours.  Invalid input(s): FREET3  Other results:   Imaging    DG CHEST PORT 1 VIEW  Result Date: 04/13/2020 CLINICAL DATA:  Respiratory failure and ECMO EXAM: PORTABLE CHEST 1 VIEW COMPARISON:  Film from the previous day. FINDINGS: Cardiac shadow is stable. ECMO cannula, tracheostomy tube and weighted feeding catheter are again noted and stable. Lungs again demonstrate diffuse bilateral airspace opacity consistent with the given clinical history of COVID-19 positivity. Persistent lucency in the left base is noted stable from the prior exam as well as multiple exams dating back to 04/03/2020. No acute abnormality noted. IMPRESSION: Overall stable appearance of the chest when compared with the prior exam Electronically Signed   By: Inez Catalina M.D.   On: 04/13/2020 03:58     Medications:     Scheduled Medications: . acetaZOLAMIDE  250 mg Oral Once  . budesonide (PULMICORT) nebulizer solution  0.5 mg Nebulization BID  . carvedilol  25 mg Oral BID WC  . chlorhexidine gluconate (MEDLINE KIT)  15 mL Mouth Rinse BID  . Chlorhexidine Gluconate Cloth  6 each Topical Daily  . chlorpheniramine-HYDROcodone  5 mL Per Tube Q12H  . chlorproMAZINE  25 mg Per Tube TID  . clonazePAM  1 mg Per Tube Daily  . clonazePAM  3 mg Per Tube QHS  . cloNIDine  0.1 mg Per Tube Q8H  . dextromethorphan  30 mg Per Tube TID  . docusate  100 mg Per Tube BID  . fiber  1 packet Per Tube BID  . free water   200 mL Per Tube Q6H  . furosemide  40 mg Intravenous BID  . gabapentin  600 mg Per Tube Q8H  . insulin aspart  0-20 Units Subcutaneous Q4H  . insulin aspart  4 Units Subcutaneous Q4H  . insulin detemir  40 Units Subcutaneous BID  . ipratropium-albuterol  3 mL Nebulization Q6H  . lactobacillus acidophilus  2 tablet Per Tube TID  . liver oil-zinc oxide  Topical 5 X Daily  . mouth rinse  15 mL Mouth Rinse 10 times per day  . melatonin  10 mg Per Tube QHS  . metoCLOPramide (REGLAN) injection  10 mg Intravenous Q8H  . nitroGLYCERIN  0.5 inch Topical Q6H  . nutrition supplement (JUVEN)  1 packet Per Tube BID BM  . nystatin  5 mL Mouth/Throat QID  . oxyCODONE  15 mg Per Tube Q6H  . pantoprazole sodium  40 mg Per Tube QHS  . QUEtiapine  200 mg Per Tube QHS  . sennosides  10 mL Per Tube QHS  . sodium chloride flush  10-40 mL Intracatheter Q12H    Infusions: . sodium chloride    . sodium chloride 10 mL/hr at 04/13/20 0700  . sodium chloride 250 mL (04/07/20 2203)  . sodium chloride Stopped (03/12/20 0131)  . albumin human 12.5 g (04/11/20 1558)  . bivalirudin (ANGIOMAX) infusion 0.5 mg/mL (Non-ACS indications) 0.08 mg/kg/hr (04/13/20 0700)  . feeding supplement (PIVOT 1.5 CAL) 1,000 mL (04/12/20 2252)  . midazolam 2 mg/hr (04/13/20 0721)    PRN Medications: Place/Maintain arterial line **AND** sodium chloride, sodium chloride, sodium chloride, acetaminophen (TYLENOL) oral liquid 160 mg/5 mL, albumin human, [DISCONTINUED] lidocaine **AND** albuterol, butamben-tetracaine-benzocaine, guaiFENesin, hydrALAZINE, labetalol, lip balm, midazolam, morphine CONCENTRATE, neomycin-bacitracin-polymyxin, ondansetron (ZOFRAN) IV, phenol, polyethylene glycol, simethicone, sodium chloride flush   Assessment/Plan   1. Acute hypoxemic respiratory failure: Due to COVID-19 PNA with bilateral infiltrates.  Refractory hypoxemia, VV-ECMO cannulation on 02/19/2020 with improvement in oxygenation.  Developed left  PTX post-subclavian CVL and had left chest tube, the left lung is re-expanded and CT out.  He was extubated 1/2 but reintubated 1/4 with agitation and suspected aspiration.  Tracheostomy 1/6.  CTA chest 1/22 with suspected chronic PEs and ARDS. ECMO cannula repositioned 1/5. ECMO circuit changed 1/26. LDH mildly higher today. Sweep at 5, have struggled with hypercarbia from significant dead space ventilation.  Now off abx. PCO2 55. HCO3 up to 30.  I/Os mildly positive and weight up.  Cough remains a major problem, worse this morning and appears uncomfortable.  - Send for CT chest w/o contrast, will check trach position and change for different trach today (versus decannulate) to see if we can lessen irritation and drive for cough.  - Slowly wean sweep but hold off until cough controlled.  - Lasix 40 mg IV bid + acetazolamide 250 mg x 1 this morning.   - Patient has had remdesivir, tocilizumab. - Completed steroid taper. - Continue bivalirudin, goal PTT 65-80. He is at 80 today. Discussed dosing with PharmD personally.  - Continue to mobilize  - Will need to consider lung transplant candidacy, assess CT chest today for progressive fibrosis.  - Coreg increased to 25 mg bid to increase fractional flow via ECMO circuit.  2. 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  Clot noted on TEE in RV as well.  TTE 1/2 showed normal EF 60-65%, RV improved (mildly dilated/dysfunctional). TEE on 1/5 with moderate to severe RV dysfunction but patient was hypoxemic.  Had 1/2 dose TPA on 1/5. Echo 1/20 with mildly dilated/mildly dysfunctional RV. CTA chest 1/22 with chronic-appearing PEs in upper lobes. - Bivalirudin for goal PTT 65-80.  He is 76 today. Discussed dosing with PharmD personally. 3. Left PTX: Left chest tube, lung is re-expanded. Tube now out, stable CXR. 4. Shock: Suspect septic/distributive.  Now resolved, off NE.  5. Anemia: Hgb 8.4, transfuse  < 8.  6. AKI: Resolved.  7. Hyperglycemia: insulin.  8. HTN: BP stable.   - On clonidine.  - Continue Coreg 25 mg bid.   - Suspect arterial line inaccurate, following cuff for now.  9. CHB: Episode of CHB when hypoxemic and with cough (suspect vagal).  NSR since then.    - Continue Coreg, watch rhythm.  10. Thrombocytopenia: Resolved  11. Ileus: Resolved. TFs restarted.  - Getting Reglan  - Cor-trak repositioned to post-pyloric placement 12. Ischemic digits: LUE.  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  Repeat study 1/17 showed no obstruction.  - Wound Care following. 13. ID: Group F Strep in sputum.  Also with Enterococcus faecalis in sputum. Completed abx.  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   15. Hypernatremia: Continue free water.   CRITICAL CARE Performed by: Loralie Champagne  Total critical care time: 40 minutes  Critical care time was exclusive of separately billable procedures and treating other patients.  Critical care was necessary to treat or prevent imminent or life-threatening deterioration.  Critical care was time spent personally by me on the following activities: development of treatment plan with patient and/or surrogate as well as nursing, discussions with consultants, evaluation of patient's response to treatment, examination of patient, obtaining history from patient or surrogate, ordering and performing treatments and interventions, ordering and review of laboratory studies, ordering and review of radiographic studies, pulse oximetry and re-evaluation of patient's condition.    Length of Stay: [place], MD  04/13/2020, 7:38 AM  Advanced Heart Failure Team Pager [phone] (M-F; 7a - 4p)  Please contact [facility] for night-coverage after hours (4p -7a ) and weekends on [email or web address]

## 2020-04-13 NOTE — Progress Notes (Signed)
  Speech Language Pathology Treatment: [name] Speaking valve  Patient Details Name: Alex [name] MRN: [id number] DOB: Jan 10, 1971 Today's Date: 04/13/2020 Time: 1022-1049 SLP Time Calculation (min) (ACUTE ONLY): 27 min  Assessment / Plan / Recommendation Clinical Impression  Pt was seen by SLP with the assistance of ECMO specialist. Pt has had his cuff partially deflated throughout the morning with evidence of cuff leak in expiratory volumes, and he has produced intermittent phonation and verbalization for staff. His breathing looks more comfortable today with slower RR compared to last week. Pt was repositioned into chair position and cuff was deflated the rest of the way to allow for inline PMV placement (PEEP lowered to 5 today). Pt had the valve donned for 9 minutes with no overt indicators of intolerance, but he has minimal verbalizations given lethargy. Soft vocalizations are noted and with Max cues he shaped sounds into "hello." Per ECMO specialist, pt had not gotten much sleep last night and had received some versed this morning, which may likely account for lethargy. Will f/u as able for ongoing inline PMV trials.    HPI HPI: 50 year old man admitted to hospital 12/28 with 1 week history of dyspnea cough nausea and vomiting.  Initially admitted to [facility] long hospital and placed on high flow nasal cannula but rapidly failed and required intubation 12/29.  Persistent hypoxic respiratory failure with PF ratio 55 in spite of 18 of PEEP FiO2 0.1 despite paralytics.  Did not improve with prone ventilation. Cannulated for VV ECMO 12/30 via right IJ crescent cannula. Iatrogenic pneumothorax from left subclavian triple-lumen placement. Trach on 1/7.      SLP Plan  Continue with current plan of care       Recommendations         Patient may use Passy-Muir Speech Valve: with SLP only PMSV Supervision: Full         Oral Care Recommendations: Oral care QID Follow up Recommendations:  Inpatient Rehab SLP Visit Diagnosis: Aphonia (R49.1) Plan: Continue with current plan of care       GO                Mahala Menghini., M.A. CCC-SLP Acute Rehabilitation Services Pager [phone] Office [phone]  04/13/2020, 12:16 PM

## 2020-04-13 NOTE — Consult Note (Signed)
WOC Nurse wound follow up Patient receiving care in [facility] 2H24.  I was asked to see the left hand with the OT, Reina Fuse. Wound type: The left hand has been ischemic for weeks.  The concern today was skin issues related to excessive Xeroform over not only ruptured skin flaps, but intact skin as well. The tip of the left index finger remains black, stable, and may at some point in the future fall off.  The skin of fingers 2, 3, and 4 is loose, related to bursting of former bullae sites.  With the OTs help, I removed all existing sheets of Xeroform, and fingers 2, 3 and 4 were rewrapped using 1/3 of a large Xeroform dressing.  We placed the skin flaps over each finger as best we could, then the Xeroform, then the OT wrapped each individual finger.  She then evaluated the wrapping to ensure it was appropriate with use of the left hand splint.  I have updated the wound order to the hand to reflect with care that should be provided. Helmut Muster, RN, MSN, CWOCN, CNS-BC, pager [phone]

## 2020-04-13 NOTE — Progress Notes (Signed)
Occupational Therapy Treatment Patient Details Name: Alex [name] MRN: [id number] DOB: Dec 19, 1970 Today's Date: 04/13/2020    History of present illness Pt is 50 y.o. male  with no significant past medical history admitted on 2020-03-15 with dyspnea, cough, nausea/vomiting ~ 1 week ago with worsening symptoms of body aches and fatigue and +COVID 02/07/20 and admitted with shortness of breath. Required intubation 03/10/20. Cannulated for VV ECMO 03/12/2020. Oxygenating better with ECMO. Also evidence of RLE DVT, RV thrombus, and high suspicion of PE. Has required chest tube to L lung for collapse which needs further reposition with recurrent collapse. Extubated 03-15-19.  Reintubated 1/5 and trach placed 1/6.   OT comments  Pt seen in conjunction with wound care RN, bandages were removed from left hand by wound care RN. 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 Pt had ~2/3 PROM for digit flexion again limited in DIP of first digit. Pt able to also demonstrate AROM finger flexion (~1/3 range) and trace finger extension. Wound care RN placed xeroform dressing individually around digits 2-4, then I applied the curlex in a modified "figure 8" pattern for fingers so as to cover xeroform but not have too much bandage to inhibit wearing of splint. Splint fitted to pt and looked good (thumb strap nor strap with grey velcro on underside will fit--but splint still usable. To be worn only at night from 8pm-12am with RN who removes it checking for any pressure areas at thumb and notifying OT if there are.(per order written). We will continue to follow.            Precautions / Restrictions Precautions Precautions: Fall Precaution Comments: ECMO, NGtube, Foley, trach Required Braces or Orthoses: Splint/Cast Splint/Cast: Left resting hand splint to be worn for 4 hours at night (8pm-12am) with RN to check thumb at end of wearing  time for any pressure issues and contact OT department if so (numbers left in orders section of chart) Restrictions Weight Bearing Restrictions: No                  Pertinent Vitals/ Pain       Pain Assessment: Faces Faces Pain Scale: No hurt                                         Time: 1050-1111 OT Time Calculation (min): 21 min  Charges: OT General Charges $OT Visit: 1 Visit OT Treatments $Orthotics/Prosthetics Check: 8-22 mins  Ignacia Palma, OTR/L Acute Rehab Services Pager [phone] Office [phone]      Evette Georges 04/13/2020, 2:51 PM

## 2020-04-13 NOTE — Progress Notes (Signed)
ECMO PROGRESS NOTE  NAME:  Alex [name], MRN:  [id number], DOB:  [birth year], LOS: 35 ADMISSION DATE:  02/18/2020, CONSULTATION DATE: 02/17/2020 REFERRING MD: Wynona Neat -LBPCCM, CHIEF COMPLAINT: Respiratory failure requiring ECMO  HPI/course in hospital  50 year old man admitted to hospital 12/28 with 1 week history of dyspnea cough nausea and vomiting.  Initially admitted to [facility] long hospital and placed on high flow nasal cannula but rapidly failed and required intubation 12/29.  Persistent hypoxic respiratory failure with PF ratio 55 in spite of 18 of PEEP FiO2 0.1 despite paralytics.  Did not improve with prone ventilation  Cannulated for VV ECMO 12/30 via right IJ crescent cannula.  ECMO circuit was changed on 04/07/2020 Iatrogenic pneumothorax from left subclavian triple-lumen placement  Past Medical History  none  Interim history/subjective:  Still with coughing fits. Slept poorly. Has throbbing pain in left hand.  Objective   Blood pressure (!) 138/91, pulse (!) 115, temperature 98.6 F (37 C), temperature source Core, resp. rate (!) 21, height 5\' 4"  (1.626 m), weight 69.7 kg, SpO2 100 %.    Vent Mode: PCV FiO2 (%):  [40 %] 40 % Set Rate:  [20 bmp] 20 bmp PEEP:  [10 cmH20] 10 cmH20   Intake/Output Summary (Last 24 hours) at 04/13/2020 06/11/2020 Last data filed at 04/13/2020 0700 Gross per 24 hour  Intake 4040.25 ml  Output 3560 ml  Net 480.25 ml   Filed Weights   04/11/20 0700 04/12/20 0600 04/13/20 0437  Weight: 66.7 kg 69 kg 69.7 kg    Examination:   Constitutional: ill appearing man coughing uncontrollably Eyes: EOMI, pupils equal Ears, nose, mouth, and throat: trach in place, small thick secretions Cardiovascular: Tachycardic, ext warm Respiratory: Diminished bases, triggering vent, small loculated effusion on R chest Gastrointestinal: +BS, soft Skin: No rashes, normal turgor Neurologic: moves all 4 ext to command Psychiatric: RASS -1 Ext: L hand wrapped,  ischemic changes stable Circuit: no clots in oxygenator, good color change  Net neutral ABG mild resp insufficiency, bicarb creeping up BMP with hypernatremia, stable cr CBG better since TF restarted WBC up Plts stably elevated Afebrile CXR bilateral infiltrates stable  Assessment & Plan:  Acute hypoxemic/hypercapnic respiratory failure due to severe ARDS from COVID-19 pneumonia Probable acute PE, and RV dysfunction s/p TPA Refractory coughing Strep group F/enterococcal pneumonia- s/p 10 days vanc 1/29 - Continue VV ECMO support, sweep weans as able - Current cough regimen: pulmicort, cetacaine, dextromethorphan, gabapentin - DC pulmicort, cetacaine as effectiveness has waned;  - Continue lung protective ventilation - Check CT chest, consider lung transplant referral but likely just needs more time  HTN  - continue increased coreg - try to treat underlying agitation/anxiety prior to initiating a CCB as patient BP correlates closely with this  Acute delirium, with agitation -  RASS goal -1 to 0, currently on klonipin 1mg  qAM 3mg  qHS, versed gtt, PRN, morphine PRN, oxycodone 15mg  q6h, seroquel 200mg  qHS - Worsened delirium causes increased BP/HR, worsened fractional flows and higher sweep needs; need to be very slow/cautious with changes; versed gtt increased a bit today  Gastroparesis with some element ileus: still an issue - Continue reglan, strengthened bowel regimen (miralax, docusate, fiberpack), QTc okay 1/31  Ischemic changes L hand - wound following - surgical consult for L hand once more stable  Poorly controlled diabetes type II -Continue levemir, SSI; decrease levemir a bit  Acute urinary retention Foley Bethanechol  Hypernatremia:  -continue FWF; dose of metolazone   Daily Goals  Checklist  Pain/Anxiety/Delirium protocol (if indicated): see above VAP protocol (if indicated): bundle in place.  DVT prophylaxis: bivalirudin GI prophylaxis:  pantoprazole Glucose control: Euglcyemic on combination of SSI and Basal insulin. Mobility/therapy needs: Mobilization as tolerated, appreciate PT help Code Status: Full code Disposition: ICU.     Patient critically ill due to COVID ARDS Interventions to address this today vent wean, ECMO wean Risk of deterioration without these interventions is high  I personally spent 36 minutes providing critical care not including any separately billable procedures  Myrla Halsted MD [facility] 04/13/2020 7:22 AM Prefer epic messenger for cross cover needs If after hours, please call E-link

## 2020-04-13 NOTE — Progress Notes (Signed)
NAME:  Alex [name], MRN:  [id number], DOB:  1970/07/23, LOS: 35 ADMISSION DATE:  02/27/2020, CONSULTATION DATE:  March 27, 2020 REFERRING MD:  Wynona Neat, CHIEF COMPLAINT:  Respiratory failure requiring ecmo   Brief History    50 year old man admitted to hospital 12/28 with 1 week history of dyspnea cough nausea and vomiting.  Initially admitted to [facility] long hospital and placed on high flow nasal cannula but rapidly failed and required intubation 12/29.  Persistent hypoxic respiratory failure with PF ratio 55 in spite of 18 of PEEP FiO2 0.1 despite paralytics.  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  Micro Data:  1/28 MRSA PCR negative  Antimicrobials:    Interim history/subjective:  Didn't sleep well, irritated/ uncomfortable. Left hand discomfort. Coughing earlier this morning. Interim, trach cuff partially deflated, seems more comfortable, still coughing. Sleepy initially but perked up with decrease in medication, work with SLP, and WOC.  Objective   Blood pressure 125/86, pulse (!) 128, temperature 99 F (37.2 C), temperature source Core, resp. rate (!) 22, height 5\' 4"  (1.626 m), weight 69.7 kg, SpO2 98 %.    Vent Mode: PCV FiO2 (%):  [40 %] 40 % Set Rate:  [20 bmp] 20 bmp PEEP:  [10 cmH20] 10 cmH20 Plateau Pressure:  [30 cmH20] 30 cmH20   Intake/Output Summary (Last 24 hours) at 04/13/2020 0945 Last data filed at 04/13/2020 8115 Gross per 24 hour  Intake 3664.32 ml  Output 2895 ml  Net 769.32 ml   Filed Weights   04/11/20 0700 04/12/20 0600 04/13/20 0437  Weight: 66.7 kg 69  kg 69.7 kg    Examination: Constitutional:Ill-appearing, uncomfortable; sleepy but awakens to name, responds to commands  HEENT: eyes anicteric, tracks to voice;  Cardiovascular: heart sounds are sinus tach, ext are warm to touch and radial, brachial and dorsalis pedis pulses present. No dependent edema  In legs/ feet noted. Respiratory: Loud, rhonchus breath sounds bilaterally, tracheal cuff partially deflated. Gastrointestinal: abdomen is soft, slightly distended with positive bowel sounds. GU: foley present, draining Skin: Left index finger blackened,diminished in size; macerated skin on fingers two and three, fourth has some softening skin, but intact. Thumb shows some distal blackened skin. Denies pain with movement and was able to make a fist and relax twice with OT. Small fissure on left great toe, healing. Neurologic: Follows commands Psychiatric: Calm, able to express not wanting to answer questions.  Resolved Hospital Problem list     Assessment & Plan:  Acute hypoxemic/hypercapnic respiratory failure due to severe ARDS from COVID-19 pneumonia Probable acute PE, and RV dysfunction s/p TPA Refractory coughing Strep group F/enterococcal pneumonia- s/p 10 days vanc 1/29 - Continue VV ECMO support, sweep weans as able - Current cough regimen: dextromethorphan, gabapentin - Continue lung protective ventilation - Chest CT 2/1 demonstrates expected ECMO cannula placement, small right pleural effusion, Bronchiectasis in both lung bases,, some cystic bronchiectasis in left lower lobe. Diffuse opacity throughout (pna vs inflammation)--> consistent with Covid and/ or ARDS on top of that. Stable 12 mm right paratracheal lymph node, possibly reactive. -Bivalrudin, aPTT of 74 -consider possibly changing trach given placement causing irritation/ coughing (balloon inflation) -Vent 40%FiO2, satting  97-100%, cuff partially deflated  HTN  - continue increased coreg - try to treat underlying  agitation/anxiety prior to initiating a CCB as patient BP correlates closely with this  Acute delirium, with agitation -  RASS goal -1 to 0, currently on klonipin 1mg  qAM 3mg  qHS, versed gtt increased to 1-4 q2hrs, PRN, morphine PRN, oxycodone 15mg  q6h, seroquel 200mg  qHS - Worsened delirium causes increased BP/HR, worsened fractional flows and higher sweep needs; need to be very slow/cautious with changes; versed gtt titrated to effect -delirium mitigation: lights on during the day, off at night; reorientation -decrease melatonin to 3mg  qhs -consider increasing seroquel to 300mg  qhs. On reglan, QTc on 1/31 was...  Gastroparesis with some element ileus: still an issue - Continue reglan, strengthened bowel regimen (miralax, docusate, fiberpack),   Ischemic changes L hand - wound following -specific guidelines per wound on wrapping (individual fingers with xeroform). - surgical consult for L hand once more stable  Poorly controlled diabetes type II -Continue levemir, SSI; decrease levemir to 35 units BID  Acute urinary retention -Foley -Bethanechol  Hypernatremia:  -continue FWF -triple diuretic--acetazolamide x1 dose, lasix 40mg  IV BID, metazolone, up 0.5L in 24hrs -monitor  Best practice:  Diet: via NGT  Pain/Anxiety/Delirium protocol (if indicated): Versed gtt, as above VAP protocol (if indicated): bundle present DVT prophylaxis: bivalrudin GI prophylaxis: pantoprazole Glucose control: good control (80s-100s) with SSI and levemir Mobility: mobilization as tolerated, PT involved Code Status: Full Disposition: ICU  This note was written by , MS4.  Medical Decision Making     Labs   CBC: Recent Labs  Lab 04/11/20 0403 04/11/20 0408 04/11/20 1610 04/11/20 1616 04/12/20 0417 04/12/20 0754 04/12/20 1700 04/12/20 1806 04/12/20 2206 04/13/20 0332 04/13/20 0334 04/13/20 0850  WBC 13.1*  --  12.3*  --  15.7*  --  18.8*  --   --  19.3*  --   --    HGB 8.7*   < > 8.6*   < > 8.3*   < > 8.9* 8.5* 8.8* 8.4* 8.5* 8.8*  HCT 29.2*   < > 27.8*   < > 27.8*   < > 29.4* 25.0* 26.0* 27.1* 25.0* 26.0*  MCV 101.4*  --  98.6  --  100.0  --  98.7  --   --  97.5  --   --   PLT 309  --  313  --  308  --  328  --   --  302  --   --    < > = values in this interval not displayed.    Basic Metabolic Panel: Recent Labs  Lab 04/08/20 0413 04/08/20 0419 04/11/20 0403 04/11/20 0408 04/11/20 1610 04/11/20 1616 04/12/20 0417 04/12/20 0754 04/12/20 1700 04/12/20 1806 04/12/20 2206 04/13/20 0332 04/13/20 0334 04/13/20 0850  NA 146*   < > 148*   < > 148*   < > 146*   < > 145 147* 146* 146* 148* 148*  K 3.8   < > 3.8   < > 3.8   < > 4.1   < > 4.4 4.1 4.3 3.9 3.9 4.6  CL 103   < > 116*  --  115*  --  113*  --  107  --   --  107  --   --   CO2 34*   < > 25  --  24  --  25  --  29  --   --  30  --   --  GLUCOSE 195*   < > 190*  --  48*  --  200*  --  185*  --   --  114*  --   --   BUN 54*   < > 50*  --  47*  --  47*  --  53*  --   --  42*  --   --   CREATININE 0.83   < > 0.72  --  0.61  --  0.67  --  0.66  --   --  0.53*  --   --   CALCIUM 8.5*   < > 9.0  --  9.0  --  9.1  --  8.7*  --   --  9.0  --   --   MG 2.7*  --   --   --   --   --   --   --   --   --   --   --   --   --    < > = values in this interval not displayed.   GFR: Estimated Creatinine Clearance: 95.6 mL/min (A) (by C-G formula based on SCr of 0.53 mg/dL (L)). Recent Labs  Lab 04/11/20 1610 04/12/20 0417 04/12/20 1640 04/12/20 1700 04/13/20 0332  WBC 12.3* 15.7*  --  18.8* 19.3*  LATICACIDVEN 0.5 1.0 0.7  --  0.6    Liver Function Tests: Recent Labs  Lab 04/08/20 0413 04/11/20 0403  AST 27 19  ALT 44 32  ALKPHOS 226* 143*  BILITOT 0.9 0.8  PROT 5.5* 5.9*  ALBUMIN 2.6* 2.9*   No results for input(s): LIPASE, AMYLASE in the last 168 hours. No results for input(s): AMMONIA in the last 168 hours.  ABG    Component Value Date/Time   PHART 7.382 04/13/2020 0850    PCO2ART 57.3 (H) 04/13/2020 0850   PO2ART 93 04/13/2020 0850   HCO3 33.9 (H) 04/13/2020 0850   TCO2 36 (H) 04/13/2020 0850   ACIDBASEDEF 1.0 04/11/2020 1717   O2SAT 97.0 04/13/2020 0850     Coagulation Profile: Recent Labs  Lab 04/09/20 0409 04/10/20 0355 04/11/20 0403 04/12/20 0417 04/13/20 0332  INR 1.5* 1.5* 1.5* 1.4* 1.5*    Cardiac Enzymes: No results for input(s): CKTOTAL, CKMB, CKMBINDEX, TROPONINI in the last 168 hours.  HbA1C: Hgb A1c MFr Bld  Date/Time Value Ref Range Status  Mar 23, 2020 06:10 PM 11.8 (H) 4.8 - 5.6 % Final    Comment:    (NOTE) Pre diabetes:          5.7%-6.4%  Diabetes:              >6.4%  Glycemic control for   <7.0% adults with diabetes   2020/03/23 08:30 AM 12.2 (H) 4.8 - 5.6 % Final    Comment:    (NOTE) Pre diabetes:          5.7%-6.4%  Diabetes:              >6.4%  Glycemic control for   <7.0% adults with diabetes     CBG: Recent Labs  Lab 04/12/20 1112 04/12/20 1639 04/12/20 1950 04/12/20 2323 04/13/20 0332  GLUCAP 118* 161* 99 87 104*    Review of Systems:   Patient condition precluded.  Past Medical History  He,  has no past medical history on file.   Surgical History    Past Surgical History:  Procedure Laterality Date  . CENTRAL LINE INSERTION  02/11/2020   Procedure: CENTRAL LINE INSERTION;  Surgeon: Laurey Morale, MD;  Location: [facility] INVASIVE CV LAB;  Service: Cardiovascular;;  . ECMO CANNULATION N/A 02/18/2020   Procedure: ECMO CANNULATION;  Surgeon: Laurey Morale, MD;  Location: [facility] INVASIVE CV LAB;  Service: Cardiovascular;  Laterality: N/A;  . TEE WITHOUT CARDIOVERSION  02/23/2020   Procedure: TRANSESOPHAGEAL ECHOCARDIOGRAM (TEE);  Surgeon: Laurey Morale, MD;  Location: [facility] INVASIVE CV LAB;  Service: Cardiovascular;;  . [place] teeth       Social History   reports that he has never smoked. He has never used smokeless tobacco. He reports current alcohol use. He reports that he does not use  drugs.   Family History   His family history includes Breast cancer in his mother; Heart disease in his father and mother.   Allergies No Known Allergies   Home Medications  Prior to Admission medications   Medication Sig Start Date End Date Taking? Authorizing Provider  acetaminophen (TYLENOL) 500 MG tablet Take 1,000 mg by mouth every 6 (six) hours as needed for mild pain.   Yes [provider]  Ascorbic Acid (VITAMIN C) 100 MG tablet Take by mouth daily.   Yes [provider]  guaiFENesin (MUCINEX) 600 MG 12 hr tablet Take 600 mg by mouth 2 (two) times daily as needed for cough or to loosen phlegm.   Yes [provider]  omeprazole (PRILOSEC) 40 MG capsule Take 40 mg by mouth daily as needed (heartburn).   Yes [provider]  ondansetron (ZOFRAN ODT) 4 MG disintegrating tablet Take 1 tablet (4 mg total) by mouth every 8 (eight) hours as needed for nausea or vomiting. 03/08/20   Milagros Loll, MD  Probiotic Product (RESTORA) CAPS Take 1 capsule by mouth daily. Patient not taking: Reported on March 18, 2020 08/01/12   Pyrtle, Carie Caddy, MD     Critical care time:

## 2020-04-13 NOTE — Progress Notes (Signed)
ANTICOAGULATION CONSULT NOTE  Pharmacy Consult for bivalirudin Indication: ECMO and VTE  Labs: Recent Labs    04/11/20 0403 04/11/20 0408 04/12/20 0417 04/12/20 0754 04/12/20 1700 04/12/20 1806 04/13/20 0332 04/13/20 0334 04/13/20 1519 04/13/20 1618 04/13/20 1625  HGB 8.7*   < > 8.3*   < > 8.9*   < > 8.4*   < > 9.2* 9.0* 9.2*  HCT 29.2*   < > 27.8*   < > 29.4*   < > 27.1*   < > 27.0* 28.2* 27.0*  PLT 309   < > 308  --  328  --  302  --   --  316  --   APTT 73*   < > 72*  --  73*  --  74*  --   --  78*  --   LABPROT 17.8*  --  17.0*  --   --   --  17.4*  --   --   --   --   INR 1.5*  --  1.4*  --   --   --  1.5*  --   --   --   --   CREATININE 0.72   < > 0.67  --  0.66  --  0.53*  --   --  0.57*  --    < > = values in this interval not displayed.    Assessment: 48 yoM admitted with COVID-19 PNA with worsening hypoxia, s/p cannulation for ECMO. Pt was started on IV heparin prior to cannulation due to acute DVTs and possible PE, transitioned to bivalirudin with ECMO. Now s/p tPA on 1/5 and tracheostomy on 1/6. Circuit last changed 1/26.  APTT remains therapeutic at 78 seconds and CBC stable.  Goal of Therapy:  aPTT 60-80 seconds   Plan:  Continue bivalirudin at 0.08 mg/kg/hr (using order-specific wt 72.1kg) Monitor q12h aPTT/CBC, LDH, and for s/sx of bleeding  Harland German, PharmD Clinical Pharmacist **Pharmacist phone directory can now be found on [email or web address] ([email or web address]).  Listed under [facility].

## 2020-04-13 NOTE — Progress Notes (Signed)
Nutrition Follow-up  DOCUMENTATION CODES:   Not applicable  INTERVENTION:   Continue Tube Feeding via Cortrak:  Pivot 1.5 to 70 ml/hr Provides 2520 kcals, 158 g of protein and 1275 mL of free water  Continue Juven BID to promote wound healing, each packet provides 80 calories, 8 grams of carbohydrate, 2.5  grams of protein (collagen), 7 grams of L-arginine and 7 grams of L-glutamine; supplement contains CaHMB, Vitamins C, E, B12 and Zinc   Recommend increasing free water flushes to account for free water deficit given persistent hypernatremia  NUTRITION DIAGNOSIS:   Increased nutrient needs related to acute illness,catabolic illness (COVID-19 infection) as evidenced by estimated needs.  Being addressed via TF   GOAL:   Patient will meet greater than or equal to 90% of their needs  Progressing  MONITOR:   Vent status,TF tolerance,Labs,Weight trends  REASON FOR ASSESSMENT:   LOS Enteral/tube feeding initiation and management  ASSESSMENT:   50 y.o. male with no significant medical history. He presented to the ED with dyspnea, cough, and N/V. He reported that his symptoms began with cough 1 week ago. He tried OTC meds but nothing helped. Symptoms worsened to include body aches, fatigue, and N/V 3 days later. He went to his PCP 12/26 and got tested for COVID; he was positive.   12/26 COVID+  12/28 Admitted to [facility] 12/29 Intubated 12/30 Transferred to [facility] VV ECMO cannulation, L PTX with Chest tube insertion 12/31 Cortrak placed, Post-pyloric  1/02 Extubated to HFNC/BiPap as needed, ECHO with EF 60-65% 1/06 TEE for ECMO cannula position, Re-Intubated, Rhinorockets placed for epistaxis, Cortrak malpositioned-repositioned and now gastric per xray 1/07 Trach placed 1/14 Cortrak advanced to post pyloric position 1/16 L. Chest tube removed 1/22 CT chest: bilateral UL PE 1/23 Bronch showed semi occlusive ?mass/polyp in trachea 1/24 Bronch showed resolution of mass in  trachea 1/26 ECMO Circuit exchanged  Remains on VV ECMO (33 days), sweep down to 5. Pt continues with coughing spells. Pain in left hand.   Remains on vent support via trach Noted plan to consider lung transplant candidacy, plan for CT chest  Pt continues to work with PT/OT/SLP; noted pt sitting at EOB for 5 mins yesterday.  With cuff partially deflated this AM, pt able to phonate/verbalize during SLP eval today  Pt with episodes of emesis over the weekend that were NOT TF colored in appearance. Vomiting episodes associated with severe coughing episodes per RN. Also noted pt vomited when lying flat for procedure. TF has been restarted without issue  Of note, Cortrak tube tip in proximal jejunum per abd xray on 1/29  Tolerating Pivot 1.5 at 70 ml/hr via Cortrak, Free water Flush 200 mL q 6 hours  L hand wrapped, plan for surgical consult once able. Wound reassessed by [facility] RN today as well.   Rectal tube removed; +BM  Labs: sodium 148 (H) Meds: reglan, colace BID, miralax daily, senokot, nutrisource fiber, lasix, ss novolog, novolog q 4 hours, levemir, lactobacillus acidophilus    Diet Order:   Diet Order    None      EDUCATION NEEDS:   Not appropriate for education at this time  Skin:  Skin Assessment: Skin Integrity Issues: Skin Integrity Issues:: Stage II,Other (Comment) DTI: buttocks, forhead Stage II: anus, coccyx Other: ischemic changes to LUE, large bulla on L hand that ruptured  Last BM:  1/31 type 7 large (rectal tube has been removed)  Height:   Ht Readings from Last 1 Encounters:  2020-03-24 5'  4" (1.626 m)    Weight:   Wt Readings from Last 1 Encounters:  04/13/20 69.7 kg    Ideal Body Weight:  59.1 kg  BMI:  Body mass index is 26.38 kg/m.  Estimated Nutritional Needs:   Kcal:  2160-2520 kcals  Protein:  130-150 g  Fluid:  >/= 2 L   Romelle Starcher MS, RDN, LDN, CNSC Registered Dietitian III Clinical Nutrition RD Pager and On-Call Pager  Number Located in [facility]

## 2020-04-13 NOTE — Progress Notes (Signed)
ABG obtained pre (0850) and post (1028)  to compare partial trach cuff deflation for ECMO and vented patient.  Patient is more comfortable with the partial cuff deflation. Results given to CCM.   Results for KOY, LAMP (MRN [id number]) as of 04/13/2020 11:32  Ref. Range 04/13/2020 08:50 04/13/2020 10:28  Sample type Unknown ARTERIAL ARTERIAL  pH, Arterial Latest Ref Range: 7.350 - 7.450  7.382 7.390  pCO2 arterial Latest Ref Range: 32.0 - 48.0 mmHg 57.3 (H) 56.2 (H)  pO2, Arterial Latest Ref Range: 83.0 - 108.0 mmHg 93 75 (L)  TCO2 Latest Ref Range: 22 - 32 mmol/L 36 (H) 36 (H)  Acid-Base Excess Latest Ref Range: 0.0 - 2.0 mmol/L 8.0 (H) 8.0 (H)  Bicarbonate Latest Ref Range: 20.0 - 28.0 mmol/L 33.9 (H) 34.1 (H)  O2 Saturation Latest Units: % 97.0 94.0  Patient temperature Unknown 37.2 C 36.9 C  Collection site Unknown  art line

## 2020-04-13 NOTE — Progress Notes (Signed)
Per Dr. Michaelle Copas request, the trach tube cuff was partially deflated for better patient comfort.  Patient RR increased to 59, sats remained stable and patient shows no signs of increased WOB. Patient states it feels more comfortable and he is able to speak with a strong voice.  An ABG will be collected in an hour.

## 2020-04-13 NOTE — Progress Notes (Signed)
ECMO and vent patient transported to CT and returned to 2H24 without complications by: Merlene Laughter, RT, ECMO Specialist Cathie Beams, RN, ECMO Specialist Sula Rumple, RT Devota Pace, RN, ECMO Specialist, ECMO Coordinator  Prior to transport all emergency drugs and equipment prepared for the trip, circuit battery verified, oxygen tubing connected to a full O2 tank set to 5L of sweep, circuit heater turned off and heater tubing clamped.  When patient returned to 2H24, the circuit was connected back to a red wall power outlet, the heater was turned back on and set to 37 degrees, the oxygen hose was connected back to the wall O2 outlet, and the oxygen tubing connected back to the circuit flow meter at 5L of sweep. Once the heater warmed to 37 degrees the heater tubing was unclamped.

## 2020-04-13 NOTE — Progress Notes (Signed)
Occupational Therapy Treatment Patient Details Name: Alex [name] MRN: [id number] DOB: [birth year] Today's Date: 04/13/2020    History of present illness Pt is 50 y.o. male  with no significant past medical history admitted on 03/04/2020 with dyspnea, cough, nausea/vomiting ~ 1 week ago with worsening symptoms of body aches and fatigue and +COVID 02/07/20 and admitted with shortness of breath. Required intubation 03/10/20. Cannulated for VV ECMO 02/19/2020. Oxygenating better with ECMO. Also evidence of RLE DVT, RV thrombus, and high suspicion of PE. Has required chest tube to L lung for collapse which needs further reposition with recurrent collapse. Extubated 03-15-19.  Reintubated 1/5 and trach placed 1/6.   OT comments  Pt seen with another OT to look at pt's left hand. Currently there is to much bandaging on left hand to look at full ROM, to look at splint fit and to establish appropriate splint wearing schedule. Within constraints of bandages he has good PROM for finger extension and he can move fingers minimally.Some of his skin on fingers looks macerated.Have contacted wound care RN to coordinate time for [place] to meet up to see patient together to remove dressing and re-dress to meet needs of wounds, splinting, and ROM. Of note original wound care RN's note stated to bandage so pt still has ability for ROM. It is my understanding from speaking with pt's RN yesterday that splint was originally ordered due to patient was keeping his hand closed and there was concern for flexion contractures.                                        Pertinent Vitals/ Pain       Pain Assessment: Faces Faces Pain Scale: No hurt                                Time: 7989-2119 OT Time Calculation (min): 22 min  Charges: OT General Charges $OT Visit: 1 Visit OT Treatments $Orthotics/Prosthetics Check: 8-22 mins  Ignacia Palma, OTR/L Acute [employer] Pager [phone] Office  [phone]      Evette Georges 04/13/2020, 2:05 PM

## 2020-04-13 NOTE — Procedures (Signed)
Extracorporeal support note  ECLS cannulation date: 02/27/2020 Last circuit change: 04/07/2020  Indication: Acute hypoxic respiratory failure due to ARDS from COVID-19 pneumonia with RV dysfunction.   Configuration: Venovenous  Drainage cannula: 32 French crescent cannula via right IJ Return cannula: Same  Pump speed: 3300 RPM Pump flow: 4.36 L/min Pump used: Cardio help  Oxygenator: Cardio help O2 blender: 100% Sweep gas: 5L  Circuit check: No clots Anticoagulant: Bivalirudin Anticoagulation targets: PTT 60-80  Changes in support:  Sweep weans as tolerated, work on balanced diuresis  Anticipated goals/duration of support: Bridge to recovery.  Multidisciplinary ECMO rounds completed.  Myrla Halsted MD PCCM 04/13/2020

## 2020-04-13 NOTE — Plan of Care (Signed)
  Problem: Education: Goal: Knowledge of risk factors and measures for prevention of condition will improve Outcome: Progressing   Problem: Coping: Goal: Psychosocial and spiritual needs will be supported Outcome: Progressing   Problem: Respiratory: Goal: Will maintain a patent airway Outcome: Progressing Goal: Complications related to the disease process, condition or treatment will be avoided or minimized Outcome: Progressing   Problem: Activity: Goal: Ability to tolerate increased activity will improve Outcome: Progressing   Problem: Respiratory: Goal: Ability to maintain a clear airway and adequate ventilation will improve Outcome: Progressing   Problem: Role Relationship: Goal: Method of communication will improve Outcome: Progressing   Problem: Health Behavior/Discharge Planning: Goal: Ability to manage health-related needs will improve Outcome: Progressing   Problem: Clinical Measurements: Goal: Ability to maintain clinical measurements within normal limits will improve Outcome: Progressing Goal: Will remain free from infection Outcome: Progressing Goal: Diagnostic test results will improve Outcome: Progressing Goal: Respiratory complications will improve Outcome: Progressing Goal: Cardiovascular complication will be avoided Outcome: Progressing   Problem: Activity: Goal: Risk for activity intolerance will decrease Outcome: Progressing   Problem: Coping: Goal: Level of anxiety will decrease Outcome: Progressing   Problem: Pain Managment: Goal: General experience of comfort will improve Outcome: Progressing   Problem: Elimination: Goal: Will not experience complications related to bowel motility Outcome: Progressing Goal: Will not experience complications related to urinary retention Outcome: Progressing   Problem: Safety: Goal: Ability to remain free from injury will improve Outcome: Progressing   Problem: Skin Integrity: Goal: Risk for impaired  skin integrity will decrease Outcome: Progressing   

## 2020-04-13 NOTE — Progress Notes (Signed)
ANTICOAGULATION CONSULT NOTE  Pharmacy Consult for bivalirudin Indication: ECMO and VTE  Labs: Recent Labs    04/11/20 0403 04/11/20 0408 04/12/20 0417 04/12/20 0754 04/12/20 1700 04/12/20 1806 04/12/20 2206 04/13/20 0332 04/13/20 0334  HGB 8.7*   < > 8.3*   < > 8.9*   < > 8.8* 8.4* 8.5*  HCT 29.2*   < > 27.8*   < > 29.4*   < > 26.0* 27.1* 25.0*  PLT 309   < > 308  --  328  --   --  302  --   APTT 73*   < > 72*  --  73*  --   --  74*  --   LABPROT 17.8*  --  17.0*  --   --   --   --  17.4*  --   INR 1.5*  --  1.4*  --   --   --   --  1.5*  --   CREATININE 0.72   < > 0.67  --  0.66  --   --  0.53*  --    < > = values in this interval not displayed.    Assessment: 39 yoM admitted with COVID-19 PNA with worsening hypoxia, s/p cannulation for ECMO. Pt was started on IV heparin prior to cannulation due to acute DVTs and possible PE, transitioned to bivalirudin with ECMO. Now s/p tPA on 1/5 and tracheostomy on 1/6. Circuit last changed 1/26.  APTT remains therapeutic at 74 seconds and CBC stable.  Goal of Therapy:  aPTT 60-80 seconds   Plan:  Continue bivalirudin at 0.08 mg/kg/hr (using order-specific wt 72.1kg) Monitor q12h aPTT/CBC, LDH, and for s/sx of bleeding  Fredonia Highland, PharmD, BCPS, [facility] Clinical Pharmacist [phone] Please check AMION for all [facility] numbers 04/13/2020

## 2020-04-13 NOTE — Progress Notes (Signed)
Orthopedic Tech Progress Note Patient Details:  Alex [name] [birth year] [id number]  Ortho Devices Type of Ortho Device: Prafo boot/shoe Ortho Device/Splint Location: BLE Ortho Device/Splint Interventions: Ordered,Application   Post Interventions Patient Tolerated: Well Instructions Provided: Care of device   Donald Pore 04/13/2020, 9:53 AM

## 2020-04-14 ENCOUNTER — Inpatient Hospital Stay (HOSPITAL_COMMUNITY): Payer: Medicaid Other

## 2020-04-14 LAB — POCT I-STAT 7, (LYTES, BLD GAS, ICA,H+H)
Acid-Base Excess: 5 mmol/L — ABNORMAL HIGH (ref 0.0–2.0)
Acid-Base Excess: 6 mmol/L — ABNORMAL HIGH (ref 0.0–2.0)
Acid-Base Excess: 6 mmol/L — ABNORMAL HIGH (ref 0.0–2.0)
Acid-Base Excess: 7 mmol/L — ABNORMAL HIGH (ref 0.0–2.0)
Acid-Base Excess: 7 mmol/L — ABNORMAL HIGH (ref 0.0–2.0)
Acid-Base Excess: 7 mmol/L — ABNORMAL HIGH (ref 0.0–2.0)
Acid-Base Excess: 8 mmol/L — ABNORMAL HIGH (ref 0.0–2.0)
Acid-Base Excess: 9 mmol/L — ABNORMAL HIGH (ref 0.0–2.0)
Acid-Base Excess: 9 mmol/L — ABNORMAL HIGH (ref 0.0–2.0)
Bicarbonate: 32.7 mmol/L — ABNORMAL HIGH (ref 20.0–28.0)
Bicarbonate: 33.8 mmol/L — ABNORMAL HIGH (ref 20.0–28.0)
Bicarbonate: 34.1 mmol/L — ABNORMAL HIGH (ref 20.0–28.0)
Bicarbonate: 34.4 mmol/L — ABNORMAL HIGH (ref 20.0–28.0)
Bicarbonate: 34.8 mmol/L — ABNORMAL HIGH (ref 20.0–28.0)
Bicarbonate: 35 mmol/L — ABNORMAL HIGH (ref 20.0–28.0)
Bicarbonate: 35 mmol/L — ABNORMAL HIGH (ref 20.0–28.0)
Bicarbonate: 35 mmol/L — ABNORMAL HIGH (ref 20.0–28.0)
Bicarbonate: 35.4 mmol/L — ABNORMAL HIGH (ref 20.0–28.0)
Calcium, Ion: 1.2 mmol/L (ref 1.15–1.40)
Calcium, Ion: 1.25 mmol/L (ref 1.15–1.40)
Calcium, Ion: 1.27 mmol/L (ref 1.15–1.40)
Calcium, Ion: 1.28 mmol/L (ref 1.15–1.40)
Calcium, Ion: 1.31 mmol/L (ref 1.15–1.40)
Calcium, Ion: 1.31 mmol/L (ref 1.15–1.40)
Calcium, Ion: 1.31 mmol/L (ref 1.15–1.40)
Calcium, Ion: 1.31 mmol/L (ref 1.15–1.40)
Calcium, Ion: 1.33 mmol/L (ref 1.15–1.40)
HCT: 23 % — ABNORMAL LOW (ref 39.0–52.0)
HCT: 24 % — ABNORMAL LOW (ref 39.0–52.0)
HCT: 26 % — ABNORMAL LOW (ref 39.0–52.0)
HCT: 27 % — ABNORMAL LOW (ref 39.0–52.0)
HCT: 27 % — ABNORMAL LOW (ref 39.0–52.0)
HCT: 28 % — ABNORMAL LOW (ref 39.0–52.0)
HCT: 28 % — ABNORMAL LOW (ref 39.0–52.0)
HCT: 28 % — ABNORMAL LOW (ref 39.0–52.0)
HCT: 29 % — ABNORMAL LOW (ref 39.0–52.0)
Hemoglobin: 7.8 g/dL — ABNORMAL LOW (ref 13.0–17.0)
Hemoglobin: 8.2 g/dL — ABNORMAL LOW (ref 13.0–17.0)
Hemoglobin: 8.8 g/dL — ABNORMAL LOW (ref 13.0–17.0)
Hemoglobin: 9.2 g/dL — ABNORMAL LOW (ref 13.0–17.0)
Hemoglobin: 9.2 g/dL — ABNORMAL LOW (ref 13.0–17.0)
Hemoglobin: 9.5 g/dL — ABNORMAL LOW (ref 13.0–17.0)
Hemoglobin: 9.5 g/dL — ABNORMAL LOW (ref 13.0–17.0)
Hemoglobin: 9.5 g/dL — ABNORMAL LOW (ref 13.0–17.0)
Hemoglobin: 9.9 g/dL — ABNORMAL LOW (ref 13.0–17.0)
O2 Saturation: 97 %
O2 Saturation: 97 %
O2 Saturation: 97 %
O2 Saturation: 98 %
O2 Saturation: 98 %
O2 Saturation: 98 %
O2 Saturation: 98 %
O2 Saturation: 98 %
O2 Saturation: 99 %
Patient temperature: 36.8
Patient temperature: 36.9
Patient temperature: 36.9
Patient temperature: 37
Patient temperature: 37
Patient temperature: 37
Patient temperature: 37
Patient temperature: 37
Patient temperature: 37
Potassium: 3.7 mmol/L (ref 3.5–5.1)
Potassium: 3.7 mmol/L (ref 3.5–5.1)
Potassium: 4.6 mmol/L (ref 3.5–5.1)
Potassium: 4.6 mmol/L (ref 3.5–5.1)
Potassium: 4.6 mmol/L (ref 3.5–5.1)
Potassium: 4.6 mmol/L (ref 3.5–5.1)
Potassium: 4.7 mmol/L (ref 3.5–5.1)
Potassium: 4.7 mmol/L (ref 3.5–5.1)
Potassium: 4.9 mmol/L (ref 3.5–5.1)
Sodium: 145 mmol/L (ref 135–145)
Sodium: 145 mmol/L (ref 135–145)
Sodium: 145 mmol/L (ref 135–145)
Sodium: 146 mmol/L — ABNORMAL HIGH (ref 135–145)
Sodium: 146 mmol/L — ABNORMAL HIGH (ref 135–145)
Sodium: 146 mmol/L — ABNORMAL HIGH (ref 135–145)
Sodium: 147 mmol/L — ABNORMAL HIGH (ref 135–145)
Sodium: 147 mmol/L — ABNORMAL HIGH (ref 135–145)
Sodium: 147 mmol/L — ABNORMAL HIGH (ref 135–145)
TCO2: 35 mmol/L — ABNORMAL HIGH (ref 22–32)
TCO2: 36 mmol/L — ABNORMAL HIGH (ref 22–32)
TCO2: 36 mmol/L — ABNORMAL HIGH (ref 22–32)
TCO2: 36 mmol/L — ABNORMAL HIGH (ref 22–32)
TCO2: 37 mmol/L — ABNORMAL HIGH (ref 22–32)
TCO2: 37 mmol/L — ABNORMAL HIGH (ref 22–32)
TCO2: 37 mmol/L — ABNORMAL HIGH (ref 22–32)
TCO2: 37 mmol/L — ABNORMAL HIGH (ref 22–32)
TCO2: 38 mmol/L — ABNORMAL HIGH (ref 22–32)
pCO2 arterial: 54.1 mmHg — ABNORMAL HIGH (ref 32.0–48.0)
pCO2 arterial: 57.6 mmHg — ABNORMAL HIGH (ref 32.0–48.0)
pCO2 arterial: 58.6 mmHg — ABNORMAL HIGH (ref 32.0–48.0)
pCO2 arterial: 62.5 mmHg — ABNORMAL HIGH (ref 32.0–48.0)
pCO2 arterial: 68.6 mmHg (ref 32.0–48.0)
pCO2 arterial: 70 mmHg (ref 32.0–48.0)
pCO2 arterial: 72 mmHg (ref 32.0–48.0)
pCO2 arterial: 76.7 mmHg (ref 32.0–48.0)
pCO2 arterial: 81.3 mmHg (ref 32.0–48.0)
pH, Arterial: 7.239 — ABNORMAL LOW (ref 7.350–7.450)
pH, Arterial: 7.272 — ABNORMAL LOW (ref 7.350–7.450)
pH, Arterial: 7.277 — ABNORMAL LOW (ref 7.350–7.450)
pH, Arterial: 7.283 — ABNORMAL LOW (ref 7.350–7.450)
pH, Arterial: 7.316 — ABNORMAL LOW (ref 7.350–7.450)
pH, Arterial: 7.356 (ref 7.350–7.450)
pH, Arterial: 7.377 (ref 7.350–7.450)
pH, Arterial: 7.384 (ref 7.350–7.450)
pH, Arterial: 7.411 (ref 7.350–7.450)
pO2, Arterial: 105 mmHg (ref 83.0–108.0)
pO2, Arterial: 105 mmHg (ref 83.0–108.0)
pO2, Arterial: 109 mmHg — ABNORMAL HIGH (ref 83.0–108.0)
pO2, Arterial: 110 mmHg — ABNORMAL HIGH (ref 83.0–108.0)
pO2, Arterial: 113 mmHg — ABNORMAL HIGH (ref 83.0–108.0)
pO2, Arterial: 114 mmHg — ABNORMAL HIGH (ref 83.0–108.0)
pO2, Arterial: 131 mmHg — ABNORMAL HIGH (ref 83.0–108.0)
pO2, Arterial: 136 mmHg — ABNORMAL HIGH (ref 83.0–108.0)
pO2, Arterial: 95 mmHg (ref 83.0–108.0)

## 2020-04-14 LAB — TYPE AND SCREEN
ABO/RH(D): B POS
Antibody Screen: NEGATIVE
Unit division: 0
Unit division: 0
Unit division: 0
Unit division: 0

## 2020-04-14 LAB — BPAM RBC
Blood Product Expiration Date: 202202092359
Blood Product Expiration Date: 202202162359
Blood Product Expiration Date: 202202172359
Blood Product Expiration Date: 202202192359
ISSUE DATE / TIME: 202201260933
ISSUE DATE / TIME: 202201260933
Unit Type and Rh: 7300
Unit Type and Rh: 7300
Unit Type and Rh: 7300
Unit Type and Rh: 7300

## 2020-04-14 LAB — PREPARE RBC (CROSSMATCH)

## 2020-04-14 LAB — BASIC METABOLIC PANEL
Anion gap: 11 (ref 5–15)
Anion gap: 7 (ref 5–15)
BUN: 53 mg/dL — ABNORMAL HIGH (ref 6–20)
BUN: 63 mg/dL — ABNORMAL HIGH (ref 6–20)
CO2: 31 mmol/L (ref 22–32)
CO2: 32 mmol/L (ref 22–32)
Calcium: 8.8 mg/dL — ABNORMAL LOW (ref 8.9–10.3)
Calcium: 8.8 mg/dL — ABNORMAL LOW (ref 8.9–10.3)
Chloride: 104 mmol/L (ref 98–111)
Chloride: 107 mmol/L (ref 98–111)
Creatinine, Ser: 0.75 mg/dL (ref 0.61–1.24)
Creatinine, Ser: 0.68 mg/dL (ref 0.61–1.24)
GFR, Estimated: >60 mL/min (ref >60)
GFR, Estimated: >60 mL/min (ref >60)
Glucose, Bld: 232 mg/dL — ABNORMAL HIGH (ref 70–99)
Glucose, Bld: 156 mg/dL — ABNORMAL HIGH (ref 70–99)
Potassium: 3.7 mmol/L (ref 3.5–5.1)
Potassium: 4.7 mmol/L (ref 3.5–5.1)
Sodium: 146 mmol/L — ABNORMAL HIGH (ref 135–145)
Sodium: 146 mmol/L — ABNORMAL HIGH (ref 135–145)

## 2020-04-14 LAB — APTT
aPTT: 76 seconds — ABNORMAL HIGH (ref 24–36)
aPTT: 82 seconds — ABNORMAL HIGH (ref 24–36)

## 2020-04-14 LAB — CBC
HCT: 24.9 % — ABNORMAL LOW (ref 39.0–52.0)
HCT: 27.8 % — ABNORMAL LOW (ref 39.0–52.0)
Hemoglobin: 7.9 g/dL — ABNORMAL LOW (ref 13.0–17.0)
Hemoglobin: 8.8 g/dL — ABNORMAL LOW (ref 13.0–17.0)
MCH: 30.9 pg (ref 26.0–34.0)
MCH: 30.3 pg (ref 26.0–34.0)
MCHC: 31.7 g/dL (ref 30.0–36.0)
MCHC: 31.7 g/dL (ref 30.0–36.0)
MCV: 97.3 fL (ref 80.0–100.0)
MCV: 95.9 fL (ref 80.0–100.0)
Platelets: 277 10*3/uL (ref 150–400)
Platelets: 263 10*3/uL (ref 150–400)
RBC: 2.56 MIL/uL — ABNORMAL LOW (ref 4.22–5.81)
RBC: 2.9 MIL/uL — ABNORMAL LOW (ref 4.22–5.81)
RDW: 16.5 % — ABNORMAL HIGH (ref 11.5–15.5)
RDW: 19.4 % — ABNORMAL HIGH (ref 11.5–15.5)
WBC: 22 10*3/uL — ABNORMAL HIGH (ref 4.0–10.5)
WBC: 21.1 10*3/uL — ABNORMAL HIGH (ref 4.0–10.5)
nRBC: 0.1 % (ref 0.0–0.2)
nRBC: 0.2 % (ref 0.0–0.2)

## 2020-04-14 LAB — GLUCOSE, CAPILLARY
Glucose-Capillary: 138 mg/dL — ABNORMAL HIGH (ref 70–99)
Glucose-Capillary: 153 mg/dL — ABNORMAL HIGH (ref 70–99)
Glucose-Capillary: 174 mg/dL — ABNORMAL HIGH (ref 70–99)
Glucose-Capillary: 185 mg/dL — ABNORMAL HIGH (ref 70–99)
Glucose-Capillary: 210 mg/dL — ABNORMAL HIGH (ref 70–99)

## 2020-04-14 LAB — PROCALCITONIN: Procalcitonin: 0.53 ng/mL

## 2020-04-14 LAB — FIBRINOGEN: Fibrinogen: 632 mg/dL — ABNORMAL HIGH (ref 210–475)

## 2020-04-14 LAB — LACTIC ACID, PLASMA
Lactic Acid, Venous: 0.8 mmol/L (ref 0.5–1.9)
Lactic Acid, Venous: 0.8 mmol/L (ref 0.5–1.9)

## 2020-04-14 LAB — LACTATE DEHYDROGENASE: LDH: 449 U/L — ABNORMAL HIGH (ref 98–192)

## 2020-04-14 LAB — PROTIME-INR
INR: 1.6 — ABNORMAL HIGH (ref 0.8–1.2)
Prothrombin Time: 18 seconds — ABNORMAL HIGH (ref 11.4–15.2)

## 2020-04-14 MED ORDER — POTASSIUM CHLORIDE 20 MEQ PO PACK
40.0000 meq | PACK | Freq: Once | ORAL | Status: AC
  Administered 2020-04-14: 40 meq
  Filled 2020-04-14: qty 2

## 2020-04-14 MED ORDER — DILTIAZEM HCL 60 MG PO TABS: 30.0000 mg | ORAL_TABLET | Freq: Four times a day (QID) | ORAL | Status: DC

## 2020-04-14 MED ORDER — FENTANYL CITRATE (PF) 100 MCG/2ML IJ SOLN
INTRAMUSCULAR | Status: AC
  Filled 2020-04-14: qty 2

## 2020-04-14 MED ORDER — DILTIAZEM HCL 60 MG PO TABS
30.0000 mg | ORAL_TABLET | Freq: Four times a day (QID) | ORAL | Status: DC
  Administered 2020-04-14 – 2020-04-17 (×12): 30 mg
  Filled 2020-04-14 (×12): qty 1

## 2020-04-14 MED ORDER — SODIUM CHLORIDE 0.9% IV SOLUTION: Freq: Once | INTRAVENOUS | Status: AC

## 2020-04-14 MED ORDER — INSULIN DETEMIR 100 UNIT/ML ~~LOC~~ SOLN
38.0000 [IU] | Freq: Two times a day (BID) | SUBCUTANEOUS | Status: DC
  Administered 2020-04-14 – 2020-04-17 (×7): 38 [IU] via SUBCUTANEOUS
  Filled 2020-04-14 (×10): qty 0.38

## 2020-04-14 NOTE — Progress Notes (Signed)
ANTICOAGULATION CONSULT NOTE  Pharmacy Consult for bivalirudin Indication: ECMO and VTE  Labs: Recent Labs    04/12/20 0417 04/12/20 0754 04/13/20 0332 04/13/20 0334 04/13/20 1618 04/13/20 1625 04/14/20 0037 04/14/20 0353 04/14/20 0400  HGB 8.3*   < > 8.4*   < > 9.0*   < > 8.2* 7.9* 7.8*  HCT 27.8*   < > 27.1*   < > 28.2*   < > 24.0* 24.9* 23.0*  PLT 308   < > 302  --  316  --   --  277  --   APTT 72*   < > 74*  --  78*  --   --  76*  --   LABPROT 17.0*  --  17.4*  --   --   --   --  18.0*  --   INR 1.4*  --  1.5*  --   --   --   --  1.6*  --   CREATININE 0.67   < > 0.53*  --  0.57*  --   --  0.75  --    < > = values in this interval not displayed.    Assessment: 35 yoM admitted with COVID-19 PNA with worsening hypoxia, s/p cannulation for ECMO. Pt was started on IV heparin prior to cannulation due to acute DVTs and possible PE, transitioned to bivalirudin with ECMO. Now s/p tPA on 1/5 and tracheostomy on 1/6. Circuit last changed 1/26.  APTT remains therapeutic at 76 seconds and CBC stable.  Goal of Therapy:  aPTT 60-80 seconds   Plan:  -Continue bivalirudin at 0.08 mg/kg/hr (using order-specific wt 72.1kg) -Monitor q12h aPTT/CBC, LDH, and for s/sx of bleeding   Fredonia Highland, PharmD, BCPS, [facility] Clinical Pharmacist [phone] Please check AMION for all [facility] numbers 04/14/2020

## 2020-04-14 NOTE — Progress Notes (Signed)
SLP Cancellation Note  Patient Details Name: Elex Mainwaring MRN: [id number] DOB: April 05, 1970   Cancelled treatment:       Reason Eval/Treat Not Completed: Medical issues which prohibited therapy. Talked to RT. Pt is attempting a trial of ATC today but will also have a bronchoscopy and trach change. Will f/u tomorrow and hope to try PMSV with ATC and new trach.    [place] Delauder, Riley Nearing 04/14/2020, 8:44 AM

## 2020-04-14 NOTE — Progress Notes (Signed)
Physical Therapy Treatment Patient Details Name: Alex [name] MRN: [id number] DOB: [birth year] Today's Date: 04/14/2020    History of Present Illness Pt is 50 y.o. male  with no significant past medical history admitted on 03/11/2020 with dyspnea, cough, nausea/vomiting ~ 1 week ago with worsening symptoms of body aches and fatigue and +COVID 02/07/20 and admitted with shortness of breath. Required intubation 03/10/20. Cannulated for VV ECMO 2020/03/30. Oxygenating better with ECMO. Also evidence of RLE DVT, RV thrombus, and high suspicion of PE. Has required chest tube to L lung for collapse which needs further reposition with recurrent collapse. Extubated 03-15-19.  Reintubated 1/5 and trach placed 1/6.    PT Comments    Pt admitted with above diagnosis. Pt was able to sit EOB x 20 minutes with min to max assist with atttempts to engage core to sit upright. Pt was able to extend neck to command but could not sustain. Pt was able to perform UE and LE exercises as well.  Pt fatigues but did tolerate EOB well overall.  Pt currently with functional limitations due to balance and endurance deficits. Pt will benefit from skilled PT to increase their independence and safety with mobility to allow discharge to the venue listed below.    Follow Up Recommendations  CIR;Supervision/Assistance - 24 hour     Equipment Recommendations  Other (comment) (TBD (pt still on ECMO and Vent))    Recommendations for Other Services       Precautions / Restrictions Precautions Precautions: Fall Precaution Comments: ECMO, NGtube, Foley, trach Required Braces or Orthoses: Splint/Cast Splint/Cast: Left resting hand splint to be worn for 4 hours at night (8pm-12am) with RN to check thumb at end of wearing time for any pressure issues and contact OT department if so (numbers left in orders section of chart) Restrictions Weight Bearing Restrictions: No    Mobility  Bed Mobility Overal bed mobility: Needs  Assistance Bed Mobility: Supine to Sit;Sit to Supine Rolling: Max assist;+2 for physical assistance;+2 for safety/equipment Sidelying to sit: Total assist;+2 for physical assistance Supine to sit: Total assist;+2 for physical assistance   Sit to sidelying: Total assist;+2 for physical assistance (extra 2 persons for lines) General bed mobility comments: A for legs and trunk: PT/OT assisted with mobility, nursing  assist with ECMO lines  Transfers                 General transfer comment: NT  Ambulation/Gait             General Gait Details: TBA   Stairs             Wheelchair Mobility    Modified Rankin (Stroke Patients Only)       Balance Overall balance assessment: Needs assistance Sitting-balance support: No upper extremity supported;Feet supported;Bilateral upper extremity supported Sitting balance-Leahy Scale: Poor Sitting balance - Comments: Varied from min A-mod A with left lateral lean as time progressed ; Sat EOB 20 minutes; Pt performed neck extension, LE LAQ, attempts to move left UE and grip with left UE; tried to have pt lateral lean and push up from side sit but pt could not initiate movement although he did try from the left elbow prop Postural control: Left lateral lean                                  Cognition Arousal/Alertness: Awake/alert Behavior During Therapy: Flat affect Overall Cognitive Status: Difficult to assess  General Comments: Spontaneously tried to start to sit up (used right hand on rail to pull self forward) before we were ready for him to start to sit up. Then when we were ready he needed increased A to sit up. Followed one command while seated EOB (to touch my hand with his RUE--it was more that he opened his hand than reached up to touch my hand). RN reports shortly before we arrived pt took [place] in right hand and suctioned his mouth.      Exercises  General Exercises - Upper Extremity Shoulder Flexion: PROM;Both;Supine;10 reps Shoulder ABduction: PROM;Both;10 reps;Supine Elbow Flexion: PROM;Both;Supine;10 reps Elbow Extension: PROM;Both;Supine;10 reps Wrist Flexion: PROM;Both;10 reps;Supine Wrist Extension: PROM;Both;10 reps;Supine Digit Composite Flexion: PROM;Both;10 reps;Supine Composite Extension: PROM;Both;10 reps;Supine General Exercises - Lower Extremity Long Arc Quad: Both;Seated;AAROM;10 reps    General Comments General comments (skin integrity, edema, etc.): Pt on trach to vent with FiO2 40% and 10 PEEP.  Also on ECMO. Gave several O2 breaths to aid with breathing during treatment.      Pertinent Vitals/Pain Pain Assessment: Faces Faces Pain Scale: Hurts little more Pain Location: generalized- more centralized to coughing Pain Descriptors / Indicators: Grimacing;Guarding;Discomfort Pain Intervention(s): Limited activity within patient's tolerance;Monitored during session;Repositioned    Home Living                      Prior Function            PT Goals (current goals can now be found in the care plan section) Acute Rehab PT Goals Patient Stated Goal: unable to state Progress towards PT goals: Progressing toward goals    Frequency    Min 3X/week      PT Plan Current plan remains appropriate    Co-evaluation              AM-PAC PT "6 Clicks" Mobility   Outcome Measure  Help needed turning from your back to your side while in a flat bed without using bedrails?: Total Help needed moving from lying on your back to sitting on the side of a flat bed without using bedrails?: Total Help needed moving to and from a bed to a chair (including a wheelchair)?: Total Help needed standing up from a chair using your arms (e.g., wheelchair or bedside chair)?: Total Help needed to walk in hospital room?: Total Help needed climbing 3-5 steps with a railing? : Total 6 Click Score: 6    End of  Session Equipment Utilized During Treatment: Other (comment) (vent with trach) Activity Tolerance: Patient limited by fatigue Patient left: in bed;with nursing/sitter in room;with call bell/phone within reach Nurse Communication: Other (comment) (nurse in room) PT Visit Diagnosis: Muscle weakness (generalized) (M62.81);Pain Pain - part of body:  (chest)     Time: 0737-1062 PT Time Calculation (min) (ACUTE ONLY): 31 min  Charges:  $Therapeutic Exercise: 8-22 mins $Therapeutic Activity: 8-22 mins                     Martha Ellerby W,PT Acute Rehabilitation Services Pager:  [phone]  Office:  [phone]     Berline Lopes 04/14/2020, 11:44 AM

## 2020-04-14 NOTE — Progress Notes (Signed)
ECMO PROGRESS NOTE  NAME:  Alex [name], MRN:  [id number], DOB:  [birth year], LOS: 36 ADMISSION DATE:  03/02/2020, CONSULTATION DATE: 03-16-2020 REFERRING MD: Wynona Neat -LBPCCM, CHIEF COMPLAINT: Respiratory failure requiring ECMO  HPI/course in hospital  50 year old man admitted to hospital 12/28 with 1 week history of dyspnea cough nausea and vomiting.  Initially admitted to [facility] long hospital and placed on high flow nasal cannula but rapidly failed and required intubation 12/29.  Persistent hypoxic respiratory failure with PF ratio 55 in spite of 18 of PEEP FiO2 0.1 despite paralytics.  Did not improve with prone ventilation  Cannulated for VV ECMO 12/30 via right IJ crescent cannula.  ECMO circuit was changed on 04/07/2020 Iatrogenic pneumothorax from left subclavian triple-lumen placement  Past Medical History  none  Interim history/subjective:  Given more sedation to allow him to sleep last night. Actually did pretty well on TC yesterday with less coughing.  Objective   Blood pressure 115/81, pulse (!) 109, temperature 98.6 F (37 C), temperature source Core, resp. rate 20, height 5\' 4"  (1.626 m), weight 66.1 kg, SpO2 98 %.    Vent Mode: PCV FiO2 (%):  [40 %] 40 % Set Rate:  [20 bmp] 20 bmp PEEP:  [10 cmH20] 10 cmH20 Plateau Pressure:  [22 cmH20-30 cmH20] 22 cmH20   Intake/Output Summary (Last 24 hours) at 04/14/2020 06/12/2020 Last data filed at 04/14/2020 0703 Gross per 24 hour  Intake 2986.49 ml  Output 4115 ml  Net -1128.51 ml   Filed Weights   04/12/20 0600 04/13/20 0437 04/14/20 0500  Weight: 69 kg 69.7 kg 66.1 kg    Examination:   Constitutional: ill appearing man on vent  Eyes: pupils equal, reactive Ears, nose, mouth, and throat: trach in place, minimal secretions Cardiovascular: tachycardic, ext warm Respiratory: scattered rhonci, mostly passive on vent with current sedation level Gastrointestinal: hypoactive BS Skin: No rashes, normal turgor Neurologic:  moves all 4 ext to command Psychiatric: RASS -3 today   Net -1.1L K a bit low ABG looks good Cr slightly up CBG better since TF restarted WBC 26 yesterday now 22, Hgb down 1 g, getting 1 unit Plts stable LDH good Afebrile CXR bilateral infiltrates stable, R effusion a little more conspicuous CT chest 2/1   Assessment & Plan:  Acute hypoxemic/hypercapnic respiratory failure due to severe ARDS from COVID-19 pneumonia Probable acute PE, and RV dysfunction s/p TPA Refractory coughing Strep group F/enterococcal pneumonia- s/p 10 days vanc 1/29 - Continue VV ECMO support, sweep weans as able - Current cough regimen:  dextromethorphan, gabapentin - DC pulmicort, cetacaine as effectiveness has waned;  - Continue lung protective ventilation - Today, lighten sedation, if still having coughing fits will try an adjustable length bivona (distal XLT goes down too far, normal shiley abuts posterior tracheal wall  HTN - HR/BP still a bit high even with heavier sedation overnight.  Trials of reducing HR/BP have resulted in reduced pCO2 (likely related to fractional flow through ECMO circuit) - continue increased coreg - add low dose diltiazem - try to treat underlying agitation/anxiety prior to initiating a CCB as patient BP correlates closely with this  Acute delirium, with agitation -  RASS goal -1 to 0, currently on klonipin 1mg  qAM 3mg  qHS, versed gtt, PRN, morphine PRN, oxycodone 15mg  q6h, seroquel 300mg  qHS - Hopefully with trach change we can start weaning down on these - Really need to encourage day/night cycles  Gastroparesis with some element ileus: still an issue but improved -  Continue reglan, strengthened bowel regimen (miralax, docusate, fiberpack), QTc okay 1/31  Ischemic changes L hand - wound following, appreciate dressing recommendations - surgical consult for L hand once more stable  Poorly controlled diabetes type II -Continue levemir, SSI; go back up on levemir a  bit  Acute urinary retention Foley Bethanechol  Hypernatremia, fluid balance- will d/w CHF team   Daily Goals Checklist  Pain/Anxiety/Delirium protocol (if indicated): see above VAP protocol (if indicated): bundle in place.  DVT prophylaxis: bivalirudin GI prophylaxis: pantoprazole Glucose control: Euglcyemic on combination of SSI and Basal insulin. Mobility/therapy needs: Mobilization as tolerated, appreciate PT help Code Status: Full code Disposition: ICU.    Patient critically ill due to COVID ARDS Interventions to address this today vent wean, ECMO wean Risk of deterioration without these interventions is high  I personally spent 32 minutes providing critical care not including any separately billable procedures  Myrla Halsted MD [facility] 04/14/2020 7:07 AM Prefer epic messenger for cross cover needs If after hours, please call E-link

## 2020-04-14 NOTE — Progress Notes (Signed)
ANTICOAGULATION CONSULT NOTE  Pharmacy Consult for bivalirudin Indication: ECMO and VTE  Labs: Recent Labs    04/12/20 0417 04/12/20 0754 04/13/20 0332 04/13/20 0334 04/13/20 1618 04/13/20 1625 04/14/20 0353 04/14/20 0400 04/14/20 1558 04/14/20 1743 04/14/20 1748  HGB 8.3*   < > 8.4*   < > 9.0*   < > 7.9*   < > 9.5* 8.8* 8.8*  HCT 27.8*   < > 27.1*   < > 28.2*   < > 24.9*   < > 28.0* 26.0* 27.8*  PLT 308   < > 302  --  316  --  277  --   --   --  263  APTT 72*   < > 74*  --  78*  --  76*  --   --   --  82*  LABPROT 17.0*  --  17.4*  --   --   --  18.0*  --   --   --   --   INR 1.4*  --  1.5*  --   --   --  1.6*  --   --   --   --   CREATININE 0.67   < > 0.53*  --  0.57*  --  0.75  --   --   --  0.68   < > = values in this interval not displayed.    Assessment: 46 yoM admitted with COVID-19 PNA with worsening hypoxia, s/p cannulation for ECMO. Pt was started on IV heparin prior to cannulation due to acute DVTs and possible PE, transitioned to bivalirudin with ECMO. Now s/p tPA on 1/5 and tracheostomy on 1/6. Circuit last changed 1/26.  APTT slightly above goal at 82. No recent change in dose and prior aPTTs have been at goal  Goal of Therapy:  aPTT 60-80 seconds   Plan:  -Continue bivalirudin at 0.08 mg/kg/hr (using order-specific wt 72.1kg) -Monitor q12h aPTT/CBC, LDH, and for s/sx of bleeding  Harland German, PharmD Clinical Pharmacist **Pharmacist phone directory can now be found on [email or web address] ([email or web address]).  Listed under [facility].

## 2020-04-14 NOTE — Procedures (Signed)
Extracorporeal support note  ECLS cannulation date: March 23, 2020 Last circuit change: 04/07/2020  Indication: Acute hypoxic respiratory failure due to ARDS from COVID-19 pneumonia with RV dysfunction.   Configuration: Venovenous  Drainage cannula: 32 French crescent cannula via right IJ Return cannula: Same  Pump speed: 3300 RPM Pump flow: 4.25 L/min Pump used: Cardio help  Oxygenator: Cardio help O2 blender: 100% Sweep gas: 4L  Circuit check: No clots Anticoagulant: Bivalirudin Anticoagulation targets: PTT 60-80  Changes in support:  Sweep weans, probably going to change out trach  Anticipated goals/duration of support: Bridge to recovery.  Multidisciplinary ECMO rounds completed.  Myrla Halsted MD PCCM 04/14/2020

## 2020-04-14 NOTE — Progress Notes (Signed)
Patient ID: Alex [name], male   DOB: 1970-07-21, 50 y.o.   MRN: [id number]     Advanced Heart Failure Rounding Note  PCP-Cardiologist: No primary care provider on file.   Subjective:    - 12/30: VV ECMO cannulation - 12/31: Left chest tube replaced - 1/2: Extubated. Echo with EF 60-65%, mildly dilated RV with mildly decreased systolic function.  - 1/4: Agitated, suspected aspiration.  Re-intubated.  - 1/5: ECMO cannula repositioned under TEE guidance. TEE showed moderately dilated/moderate-severely dysfunctional RV in setting of hypoxemia. LUE DVT found.  Patient got 1/2 dose of TPA due to initial concern for large PE.  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  - 1/6: Tracheostomy - 1/7: Echo with mild RV dilation/mild RV dysfunction.  - 1/16: Left chest tube out - 1/17: LUE arterial dopplers repeated, showed no obstruction.  - 1/20: Echo with EF 65-70%, mildly D-shaped septum, mildly dilated and mildly dysfunctional RV.  - 1/22: CTA chest: Bilateral upper lobe PEs (suspect chronic), changes c/w ARDS - 1/23: Bronchoscopy showed semi-occlusive ?mass/polyp in the trachea.  - 1/24: Bronchoscopy showed resolution of 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 - 1/26: ECMO circuit changed.  - 2/1: CT chest showed diffuse bronchiectasis as well as diffuse opacity consistent with COVID-19 PNA with ARDS.  Sweep lower at 4 today, stable CXR.  Cough improved with Versed.   He is now off abx. PCT 0.4 and WBCs 22 (lower).  Trach aspirate sent yesterday.   ECMO parameters: 3300 rpm Flow 4.3 L/min Pvenous -78 Delta P 28 Sweep 4 FiO2 0.4  ABG 7.38/58.6/105/98% LDH  1,117 => 911 => 751 => 699 => 691 => 534 => 488 => 547 => 461 => 474 => 489 => 553 => 449 PTT 76 Lactate 0.8  Objective:   Weight Range: 66.1 kg Body mass index is 25.01 kg/m.   Vital Signs:   Temp:  [98.6 F (37 C)] 98.6 F (37 C) (02/02 0715) Pulse Rate:  [107-128] 112 (02/02 0715) Resp:  [0-59] 32 (02/02 0715) BP:  (98-163)/(63-106) 115/81 (02/02 0715) SpO2:  [94 %-100 %] 100 % (02/02 0715) Arterial Line BP: (116-230)/(48-94) 173/90 (02/02 0715) FiO2 (%):  [40 %] 40 % (02/02 0715) Weight:  [66.1 kg] 66.1 kg (02/02 0500) Last BM Date: 04/12/20  Weight change: Filed Weights   04/12/20 0600 04/13/20 0437 04/14/20 0500  Weight: 69 kg 69.7 kg 66.1 kg    Intake/Output:   Intake/Output Summary (Last 24 hours) at 04/14/2020 0806 Last data filed at 04/14/2020 0715 Gross per 24 hour  Intake 3375.83 ml  Output 4085 ml  Net -709.17 ml      Physical Exam    General: Sedated on vent Neck: Tracheostomy. No JVD, no thyromegaly or thyroid nodule.  Lungs: Crackles at bases.  CV: Nondisplaced PMI.  Heart regular S1/S2, no S3/S4, no murmur.  No peripheral edema.   Abdomen: Soft, nontender, no hepatosplenomegaly, no distention.  Skin: Intact without lesions or rashes.  Neurologic: Will awaken and follow commands. Extremities: No clubbing or cyanosis.  HEENT: Normal.    Telemetry   NSR 110s Personally reviewed   Labs    CBC Recent Labs    04/13/20 1618 04/13/20 1625 04/14/20 0353 04/14/20 0400  WBC 26.4*  --  22.0*  --   HGB 9.0*   < > 7.9* 7.8*  HCT 28.2*   < > 24.9* 23.0*  MCV 97.6  --  97.3  --   PLT 316  --  277  --    < > =  values in this interval not displayed.   Basic Metabolic Panel Recent Labs    04/13/20 1618 04/13/20 1625 04/14/20 0353 04/14/20 0400  NA 146*   < > 146* 147*  K 3.4*   < > 3.7 3.7  CL 106  --  104  --   CO2 30  --  31  --   GLUCOSE 105*  --  232*  --   BUN 54*  --  53*  --   CREATININE 0.57*  --  0.75  --   CALCIUM 9.0  --  8.8*  --    < > = values in this interval not displayed.   Liver Function Tests No results for input(s): AST, ALT, ALKPHOS, BILITOT, PROT, ALBUMIN in the last 72 hours. No results for input(s): LIPASE, AMYLASE in the last 72 hours. Cardiac Enzymes No results for input(s): CKTOTAL, CKMB, CKMBINDEX, TROPONINI in the last 72  hours.  BNP: BNP (last 3 results) No results for input(s): BNP in the last 8760 hours.  ProBNP (last 3 results) No results for input(s): PROBNP in the last 8760 hours.   D-Dimer No results for input(s): DDIMER in the last 72 hours. Hemoglobin A1C No results for input(s): HGBA1C in the last 72 hours. Fasting Lipid Panel No results for input(s): CHOL, HDL, LDLCALC, TRIG, CHOLHDL, LDLDIRECT in the last 72 hours. Thyroid Function Tests No results for input(s): TSH, T4TOTAL, T3FREE, THYROIDAB in the last 72 hours.  Invalid input(s): FREET3  Other results:   Imaging    CT CHEST WO CONTRAST  Result Date: 04/13/2020 CLINICAL DATA:  Respiratory distress syndrome. EXAM: CT CHEST WITHOUT CONTRAST TECHNIQUE: Multidetector CT imaging of the chest was performed following the standard protocol without IV contrast. COMPARISON:  April 03, 2020. FINDINGS: Cardiovascular: Mild cardiomegaly is noted. No pericardial effusion is noted. No evidence of thoracic aortic aneurysm is noted. ECMO device is seen involving the right internal jugular vein and passing through the SVC right atrium and into the IVC. Right-sided PICC line is seen with distal tip in expected position of cavoatrial junction. Mediastinum/Nodes: Feeding tube is seen passing through esophagus and into stomach. Thyroid gland is unremarkable. Tracheostomy tube is in grossly good position. Stable 12 mm right paratracheal lymph node is noted. Lungs/Pleura: No pneumothorax is noted. Small right pleural effusion is noted. Bronchiectasis is noted throughout both lung bases. Some degree of cystic bronchiectasis is noted in the left lower lobe. There remains diffuse opacity of both lungs consistent with pneumonia or inflammation. Upper Abdomen: No acute abnormality. Musculoskeletal: No chest wall mass or suspicious bone lesions identified. IMPRESSION: 1. ECMO device is seen involving the right internal jugular vein and passing through the SVC, right  atrium and into the IVC. Right-sided PICC line is seen with distal tip in expected position of cavoatrial junction. 2. Small right pleural effusion is noted. 3. Bronchiectasis is noted throughout both lung bases. Some degree of cystic bronchiectasis is noted in the left lower lobe. There remains diffuse opacity of both lungs consistent with pneumonia or inflammation. These findings are consistent with COVID-19 pneumonia as well as superimposed ARDS. 4. Stable 12 mm right paratracheal lymph node is noted which most likely is reactive in etiology. Electronically Signed   By: Marijo Conception M.D.   On: 04/13/2020 08:48   DG CHEST PORT 1 VIEW  Result Date: 04/14/2020 CLINICAL DATA:  Tracheostomy.  ECMO.  COVID EXAM: PORTABLE CHEST 1 VIEW COMPARISON:  CT chest 04/13/2020.  Chest x-ray 05/03/2020. FINDINGS:  Tracheostomy tube, feeding tube, right PICC line, ECMO device in stable position. Heart size stable. Unchanged dense bilateral pulmonary infiltrates. Small bilateral pleural effusions again noted. No pneumothorax. IMPRESSION: 1. Lines and tubes in stable position. 2. Unchanged dense bilateral pulmonary infiltrates. Small bilateral pleural effusions again noted. Electronically Signed   By: Marcello Moores  Register   On: 04/14/2020 06:38     Medications:     Scheduled Medications: . carvedilol  25 mg Oral BID WC  . chlorhexidine gluconate (MEDLINE KIT)  15 mL Mouth Rinse BID  . Chlorhexidine Gluconate Cloth  6 each Topical Daily  . chlorpheniramine-HYDROcodone  5 mL Per Tube Q12H  . clonazePAM  1 mg Per Tube Daily  . clonazePAM  3 mg Per Tube QHS  . cloNIDine  0.1 mg Per Tube Q8H  . dextromethorphan  30 mg Per Tube TID  . diltiazem  30 mg Per Tube Q6H  . docusate  100 mg Per Tube BID  . fiber  1 packet Per Tube BID  . free water  200 mL Per Tube Q4H  . gabapentin  600 mg Per Tube Q8H  . insulin aspart  0-20 Units Subcutaneous Q4H  . insulin aspart  4 Units Subcutaneous Q4H  . insulin detemir  38 Units  Subcutaneous BID  . lactobacillus acidophilus  2 tablet Per Tube TID  . liver oil-zinc oxide   Topical 5 X Daily  . mouth rinse  15 mL Mouth Rinse 10 times per day  . melatonin  3 mg Per Tube QHS  . metoCLOPramide (REGLAN) injection  10 mg Intravenous Q8H  . nutrition supplement (JUVEN)  1 packet Per Tube BID BM  . nystatin  5 mL Mouth/Throat QID  . oxyCODONE  15 mg Per Tube Q6H  . pantoprazole sodium  40 mg Per Tube QHS  . QUEtiapine  300 mg Per Tube QHS  . sennosides  10 mL Per Tube QHS  . sodium chloride flush  10-40 mL Intracatheter Q12H    Infusions: . sodium chloride    . sodium chloride 10 mL/hr at 04/14/20 0703  . sodium chloride 250 mL (04/07/20 2203)  . sodium chloride Stopped (03/12/20 0131)  . albumin human 12.5 g (04/11/20 1558)  . bivalirudin (ANGIOMAX) infusion 0.5 mg/mL (Non-ACS indications) 0.08 mg/kg/hr (04/14/20 0703)  . feeding supplement (PIVOT 1.5 CAL) 1,000 mL (04/14/20 0031)  . midazolam 2 mg/hr (04/14/20 0703)    PRN Medications: Place/Maintain arterial line **AND** sodium chloride, sodium chloride, sodium chloride, acetaminophen (TYLENOL) oral liquid 160 mg/5 mL, albumin human, [DISCONTINUED] lidocaine **AND** albuterol, guaiFENesin, hydrALAZINE, labetalol, lip balm, midazolam, morphine CONCENTRATE, ondansetron (ZOFRAN) IV, phenol, polyethylene glycol, simethicone, sodium chloride flush   Assessment/Plan   1. Acute hypoxemic respiratory failure: Due to COVID-19 PNA with bilateral infiltrates.  Refractory hypoxemia, VV-ECMO cannulation on 02/26/2020 with improvement in oxygenation.  Developed left PTX post-subclavian CVL and had left chest tube, the left lung is re-expanded and CT out.  He was extubated 1/2 but reintubated 1/4 with agitation and suspected aspiration.  Tracheostomy 1/6.  CTA chest 1/22 with suspected chronic PEs and ARDS. ECMO cannula repositioned 1/5. ECMO circuit changed 1/26. LDH stable. Sweep lower at 4, have struggled with hypercarbia from  significant dead space ventilation.  Now off abx but WBCs high. PCO2 58.6. HCO3 31.  I/Os negative with Lasix.  Cough remains a major problem though better today. - Will change trach today to try to lessen irritation triggering cough. - Slowly wean sweep, hopefully can continue  to go down.  - No diuretics today.  - Patient has had remdesivir, tocilizumab. - Completed steroid taper. - Continue bivalirudin, goal PTT 65-80. He is at 51 today. Discussed dosing with PharmD personally.  - Continue to mobilize  - Coreg increased to 25 mg bid to increase fractional flow via ECMO circuit.  2. 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  Clot noted on TEE in RV as well.  TTE 1/2 showed normal EF 60-65%, RV improved (mildly dilated/dysfunctional). TEE on 1/5 with moderate to severe RV dysfunction but patient was hypoxemic.  Had 1/2 dose TPA on 1/5. Echo 1/20 with mildly dilated/mildly dysfunctional RV. CTA chest 1/22 with chronic-appearing PEs in upper lobes. - Bivalirudin for goal PTT 65-80.  He is 78 today. Discussed dosing with PharmD personally. 3. Left PTX: Left chest tube, lung is re-expanded. Tube now out, stable CXR. 4. Shock: Suspect septic/distributive.  Now resolved, off NE.  5. Anemia: Hgb 8.4, transfuse < 8.   6. AKI: Resolved.  7. Hyperglycemia: insulin.  8. HTN: BP stable.   - On clonidine.  - Continue Coreg 25 mg bid.   - Suspect arterial line inaccurate, following cuff for now.  9. CHB: Episode of CHB when hypoxemic and with cough (suspect vagal).  NSR since then.    - Continue Coreg, watch rhythm.  10. Thrombocytopenia: Resolved  11. Ileus: Resolved. TFs restarted.  - Getting Reglan  - Cor-trak repositioned to post-pyloric placement 12. Ischemic digits: LUE.  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  Repeat study 1/17 showed no obstruction.  - Wound Care following. 13. ID: Group F Strep in sputum.  Also  with Enterococcus faecalis in sputum. Completed abx. WBCs now up to 22K with PCT 0.4.  - Resend PCT - Sent trach aspirate culture.  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   15. Hypernatremia: Continue free water.   CRITICAL CARE Performed by: Loralie Champagne  Total critical care time: 40 minutes  Critical care time was exclusive of separately billable procedures and treating other patients.  Critical care was necessary to treat or prevent imminent or life-threatening deterioration.  Critical care was time spent personally by me on the following activities: development of treatment plan with patient and/or surrogate as well as nursing, discussions with consultants, evaluation of patient's response to treatment, examination of patient, obtaining history from patient or surrogate, ordering and performing treatments and interventions, ordering and review of laboratory studies, ordering and review of radiographic studies, pulse oximetry and re-evaluation of patient's condition.    Length of Stay: [place], MD  04/14/2020, 8:06 AM  Advanced Heart Failure Team Pager [phone] (M-F; 7a - 4p)  Please contact [facility] for night-coverage after hours (4p -7a ) and weekends on [email or web address]

## 2020-04-15 ENCOUNTER — Inpatient Hospital Stay (HOSPITAL_COMMUNITY): Payer: Medicaid Other

## 2020-04-15 LAB — POCT I-STAT 7, (LYTES, BLD GAS, ICA,H+H)
Acid-Base Excess: 10 mmol/L — ABNORMAL HIGH (ref 0.0–2.0)
Acid-Base Excess: 8 mmol/L — ABNORMAL HIGH (ref 0.0–2.0)
Acid-Base Excess: 8 mmol/L — ABNORMAL HIGH (ref 0.0–2.0)
Acid-Base Excess: 10 mmol/L — ABNORMAL HIGH (ref 0.0–2.0)
Bicarbonate: 37.3 mmol/L — ABNORMAL HIGH (ref 20.0–28.0)
Bicarbonate: 35.1 mmol/L — ABNORMAL HIGH (ref 20.0–28.0)
Bicarbonate: 35 mmol/L — ABNORMAL HIGH (ref 20.0–28.0)
Bicarbonate: 36 mmol/L — ABNORMAL HIGH (ref 20.0–28.0)
Calcium, Ion: 1.28 mmol/L (ref 1.15–1.40)
Calcium, Ion: 1.28 mmol/L (ref 1.15–1.40)
Calcium, Ion: 1.28 mmol/L (ref 1.15–1.40)
Calcium, Ion: 1.26 mmol/L (ref 1.15–1.40)
HCT: 26 % — ABNORMAL LOW (ref 39.0–52.0)
HCT: 26 % — ABNORMAL LOW (ref 39.0–52.0)
HCT: 26 % — ABNORMAL LOW (ref 39.0–52.0)
HCT: 24 % — ABNORMAL LOW (ref 39.0–52.0)
Hemoglobin: 8.8 g/dL — ABNORMAL LOW (ref 13.0–17.0)
Hemoglobin: 8.8 g/dL — ABNORMAL LOW (ref 13.0–17.0)
Hemoglobin: 8.8 g/dL — ABNORMAL LOW (ref 13.0–17.0)
Hemoglobin: 8.2 g/dL — ABNORMAL LOW (ref 13.0–17.0)
O2 Saturation: 99 %
O2 Saturation: 99 %
O2 Saturation: 96 %
O2 Saturation: 99 %
Patient temperature: 37
Patient temperature: 37
Patient temperature: 36.9
Patient temperature: 36.9
Potassium: 4.5 mmol/L (ref 3.5–5.1)
Potassium: 4.5 mmol/L (ref 3.5–5.1)
Potassium: 3.9 mmol/L (ref 3.5–5.1)
Potassium: 3.7 mmol/L (ref 3.5–5.1)
Sodium: 146 mmol/L — ABNORMAL HIGH (ref 135–145)
Sodium: 145 mmol/L (ref 135–145)
Sodium: 147 mmol/L — ABNORMAL HIGH (ref 135–145)
Sodium: 146 mmol/L — ABNORMAL HIGH (ref 135–145)
TCO2: 39 mmol/L — ABNORMAL HIGH (ref 22–32)
TCO2: 37 mmol/L — ABNORMAL HIGH (ref 22–32)
TCO2: 37 mmol/L — ABNORMAL HIGH (ref 22–32)
TCO2: 38 mmol/L — ABNORMAL HIGH (ref 22–32)
pCO2 arterial: 64.9 mmHg — ABNORMAL HIGH (ref 32.0–48.0)
pCO2 arterial: 68 mmHg (ref 32.0–48.0)
pCO2 arterial: 61.3 mmHg — ABNORMAL HIGH (ref 32.0–48.0)
pCO2 arterial: 58.1 mmHg — ABNORMAL HIGH (ref 32.0–48.0)
pH, Arterial: 7.368 (ref 7.350–7.450)
pH, Arterial: 7.321 — ABNORMAL LOW (ref 7.350–7.450)
pH, Arterial: 7.363 (ref 7.350–7.450)
pH, Arterial: 7.399 (ref 7.350–7.450)
pO2, Arterial: 124 mmHg — ABNORMAL HIGH (ref 83.0–108.0)
pO2, Arterial: 145 mmHg — ABNORMAL HIGH (ref 83.0–108.0)
pO2, Arterial: 88 mmHg (ref 83.0–108.0)
pO2, Arterial: 138 mmHg — ABNORMAL HIGH (ref 83.0–108.0)

## 2020-04-15 LAB — CBC
HCT: 27.7 % — ABNORMAL LOW (ref 39.0–52.0)
HCT: 25.6 % — ABNORMAL LOW (ref 39.0–52.0)
Hemoglobin: 8.3 g/dL — ABNORMAL LOW (ref 13.0–17.0)
Hemoglobin: 8 g/dL — ABNORMAL LOW (ref 13.0–17.0)
MCH: 29.2 pg (ref 26.0–34.0)
MCH: 30.5 pg (ref 26.0–34.0)
MCHC: 30 g/dL (ref 30.0–36.0)
MCHC: 31.3 g/dL (ref 30.0–36.0)
MCV: 97.5 fL (ref 80.0–100.0)
MCV: 97.7 fL (ref 80.0–100.0)
Platelets: 258 10*3/uL (ref 150–400)
Platelets: 259 10*3/uL (ref 150–400)
RBC: 2.84 MIL/uL — ABNORMAL LOW (ref 4.22–5.81)
RBC: 2.62 MIL/uL — ABNORMAL LOW (ref 4.22–5.81)
RDW: 19.1 % — ABNORMAL HIGH (ref 11.5–15.5)
RDW: 18.9 % — ABNORMAL HIGH (ref 11.5–15.5)
WBC: 16.1 10*3/uL — ABNORMAL HIGH (ref 4.0–10.5)
WBC: 13.7 10*3/uL — ABNORMAL HIGH (ref 4.0–10.5)
nRBC: 0.2 % (ref 0.0–0.2)
nRBC: 0.2 % (ref 0.0–0.2)

## 2020-04-15 LAB — BASIC METABOLIC PANEL
Anion gap: 9 (ref 5–15)
Anion gap: 9 (ref 5–15)
BUN: 55 mg/dL — ABNORMAL HIGH (ref 6–20)
BUN: 64 mg/dL — ABNORMAL HIGH (ref 6–20)
CO2: 32 mmol/L (ref 22–32)
CO2: 32 mmol/L (ref 22–32)
Calcium: 8.9 mg/dL (ref 8.9–10.3)
Calcium: 8.9 mg/dL (ref 8.9–10.3)
Chloride: 105 mmol/L (ref 98–111)
Chloride: 105 mmol/L (ref 98–111)
Creatinine, Ser: 0.74 mg/dL (ref 0.61–1.24)
Creatinine, Ser: 0.71 mg/dL (ref 0.61–1.24)
GFR, Estimated: >60 mL/min (ref >60)
GFR, Estimated: >60 mL/min (ref >60)
Glucose, Bld: 153 mg/dL — ABNORMAL HIGH (ref 70–99)
Glucose, Bld: 120 mg/dL — ABNORMAL HIGH (ref 70–99)
Potassium: 4.5 mmol/L (ref 3.5–5.1)
Potassium: 3.8 mmol/L (ref 3.5–5.1)
Sodium: 146 mmol/L — ABNORMAL HIGH (ref 135–145)
Sodium: 146 mmol/L — ABNORMAL HIGH (ref 135–145)

## 2020-04-15 LAB — GLUCOSE, CAPILLARY
Glucose-Capillary: 112 mg/dL — ABNORMAL HIGH (ref 70–99)
Glucose-Capillary: 117 mg/dL — ABNORMAL HIGH (ref 70–99)
Glucose-Capillary: 142 mg/dL — ABNORMAL HIGH (ref 70–99)
Glucose-Capillary: 144 mg/dL — ABNORMAL HIGH (ref 70–99)
Glucose-Capillary: 150 mg/dL — ABNORMAL HIGH (ref 70–99)
Glucose-Capillary: 160 mg/dL — ABNORMAL HIGH (ref 70–99)
Glucose-Capillary: 187 mg/dL — ABNORMAL HIGH (ref 70–99)

## 2020-04-15 LAB — APTT
aPTT: 78 seconds — ABNORMAL HIGH (ref 24–36)
aPTT: 79 seconds — ABNORMAL HIGH (ref 24–36)

## 2020-04-15 LAB — LACTIC ACID, PLASMA
Lactic Acid, Venous: 0.8 mmol/L (ref 0.5–1.9)
Lactic Acid, Venous: 0.8 mmol/L (ref 0.5–1.9)

## 2020-04-15 LAB — PROTIME-INR
INR: 1.5 — ABNORMAL HIGH (ref 0.8–1.2)
Prothrombin Time: 17.7 seconds — ABNORMAL HIGH (ref 11.4–15.2)

## 2020-04-15 LAB — FIBRINOGEN: Fibrinogen: 660 mg/dL — ABNORMAL HIGH (ref 210–475)

## 2020-04-15 LAB — LACTATE DEHYDROGENASE: LDH: 457 U/L — ABNORMAL HIGH (ref 98–192)

## 2020-04-15 MED ORDER — FENTANYL CITRATE (PF) 100 MCG/2ML IJ SOLN
INTRAMUSCULAR | Status: AC
  Filled 2020-04-15: qty 2

## 2020-04-15 MED ORDER — FUROSEMIDE 10 MG/ML IJ SOLN
40.0000 mg | Freq: Two times a day (BID) | INTRAMUSCULAR | Status: AC
  Administered 2020-04-15 (×2): 40 mg via INTRAVENOUS
  Filled 2020-04-15 (×2): qty 4

## 2020-04-15 MED ORDER — MORPHINE (PF) INJECTION FOR INHALATION 10 MG/ML
10.0000 mg | RESPIRATORY_TRACT | Status: DC | PRN
  Administered 2020-04-16 – 2020-04-21 (×5): 10 mg via RESPIRATORY_TRACT
  Filled 2020-04-15 (×5): qty 1

## 2020-04-15 MED ORDER — MIDAZOLAM BOLUS VIA INFUSION
2.0000 mg | INTRAVENOUS | Status: DC | PRN
  Filled 2020-04-15: qty 2

## 2020-04-15 MED ORDER — FUROSEMIDE 10 MG/ML IJ SOLN: 40.0000 mg | Freq: Once | INTRAMUSCULAR | Status: DC

## 2020-04-15 MED ORDER — POTASSIUM CHLORIDE 20 MEQ PO PACK
40.0000 meq | PACK | Freq: Once | ORAL | Status: AC
  Administered 2020-04-15: 40 meq
  Filled 2020-04-15: qty 2

## 2020-04-15 MED ORDER — ACETAZOLAMIDE 250 MG PO TABS
500.0000 mg | ORAL_TABLET | Freq: Two times a day (BID) | ORAL | Status: AC
  Administered 2020-04-15 (×2): 500 mg via ORAL
  Filled 2020-04-15 (×2): qty 2

## 2020-04-15 MED ORDER — ETOMIDATE 2 MG/ML IV SOLN
INTRAVENOUS | Status: AC
  Administered 2020-04-15: 20 mg
  Filled 2020-04-15: qty 20

## 2020-04-15 MED ORDER — METOLAZONE 2.5 MG PO TABS: 2.5000 mg | ORAL_TABLET | Freq: Once | ORAL | Status: DC

## 2020-04-15 MED ORDER — NOREPINEPHRINE 4 MG/250ML-% IV SOLN
0.0000 ug/min | INTRAVENOUS | Status: DC
  Administered 2020-04-17: 5 ug/min via INTRAVENOUS
  Administered 2020-04-18: 2 ug/min via INTRAVENOUS
  Filled 2020-04-15 (×2): qty 250

## 2020-04-15 MED ORDER — ROCURONIUM BROMIDE 10 MG/ML (PF) SYRINGE
PREFILLED_SYRINGE | INTRAVENOUS | Status: AC
  Administered 2020-04-15: 60 mg
  Filled 2020-04-15: qty 10

## 2020-04-15 NOTE — Procedures (Signed)
Tracheostomy Change Note  Patient Details:   Name: Laurent Cargile DOB: 07-23-1970 MRN: [id number]    Airway Documentation:     Evaluation  O2 sats: stable throughout Complications: No apparent complications Patient did tolerate procedure well. Bilateral Breath Sounds: Diminished    CCM MD and RT changed patient's trach to #6 Bivona hyperflex XL with no complications. Bronchoscopy used to confirm trach placement. VSS. RT will continue to monitor.   Dioselina Brumbaugh Lajuana Ripple 04/15/2020, 8:54 AM

## 2020-04-15 NOTE — Progress Notes (Signed)
ECMO PROGRESS NOTE  NAME:  Alex [name], MRN:  [id number], DOB:  [birth year], LOS: 37 ADMISSION DATE:  2020-03-22, CONSULTATION DATE: 02/25/2020 REFERRING MD: Wynona Neat -LBPCCM, CHIEF COMPLAINT: Respiratory failure requiring ECMO  HPI/course in hospital  50 year old man admitted to hospital 12/28 with 1 week history of dyspnea cough nausea and vomiting.  Initially admitted to [facility] long hospital and placed on high flow nasal cannula but rapidly failed and required intubation 12/29.  Persistent hypoxic respiratory failure with PF ratio 55 in spite of 18 of PEEP FiO2 0.1 despite paralytics.  Did not improve with prone ventilation  Cannulated for VV ECMO 12/30 via right IJ crescent cannula.  ECMO circuit was changed on 04/07/2020 Iatrogenic pneumothorax from left subclavian triple-lumen placement  Past Medical History  none  Interim history/subjective:  Did not make much urine yesterday.  Objective   Blood pressure 108/76, pulse 95, temperature (P) 98.6 F (37 C), temperature source (P) Core, resp. rate 17, height 5\' 4"  (1.626 m), weight 69.1 kg, SpO2 100 %.    Vent Mode: SIMV;PSV;PCV FiO2 (%):  [40 %] 40 % Set Rate:  [20 bmp-24 bmp] 24 bmp PEEP:  [10 cmH20] 10 cmH20 Pressure Support:  [16 cmH20] 16 cmH20 Plateau Pressure:  [19 cmH20-28 cmH20] 28 cmH20   Intake/Output Summary (Last 24 hours) at 04/15/2020 06/13/2020 Last data filed at 04/15/2020 06/13/2020 Gross per 24 hour  Intake 4382.74 ml  Output 1605 ml  Net 2777.74 ml   Filed Weights   04/13/20 0437 04/14/20 0500 04/15/20 0500  Weight: 69.7 kg 66.1 kg 69.1 kg    Examination:   Constitutional: no acute distress  Eyes: EOMI, pupils equal Ears, nose, mouth, and throat: trach in place, small thick secretions Cardiovascular: tachycardic, ext warm Respiratory: diminished bases, triggering vent, plat 30, PEEP 10, DP 20cmH2O Gastrointestinal: slighlty distended, +BS Skin: No rashes, normal turgor Neurologic: moves all 4 ext to  command, globally weak Psychiatric: RASS 0 Circuit: no clots in oxygenator, good color change   Net +2.7L ABG starting to show contraction alkalosis again Cr improved CBG stable WBC improved, H/H stable Plts stable No LDH today Afebrile CXR worse pulmonary edema CT chest 2/1: 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, no frank fibrosis, ongoing airspace disease, small R effusion  Assessment & Plan:  Acute hypoxemic/hypercapnic respiratory failure due to severe ARDS from COVID-19 pneumonia Probable acute PE, and RV dysfunction s/p TPA Refractory coughing Strep group F/enterococcal pneumonia- s/p 10 days vanc 1/29 - Continue VV ECMO support, sweep weans as able - Current cough regimen:  dextromethorphan, gabapentin - Continue lung protective ventilation - Today, balanced diuresis, change out tracheostomy for bivona to see if it sit better in trachea and reduces posterior tracheal membrane irritation (and thereby hopefully his cough)  HTN - Trials of reducing HR/BP have resulted in reduced pCO2 (likely related to fractional flow through ECMO circuit) - continue increased coreg, low dose diltiazem  Acute delirium, with agitation -  RASS goal -1 to 0, currently on klonipin 1mg  qAM 3mg  qHS, versed gtt, PRN, morphine PRN, oxycodone 15mg  q6h, seroquel 300mg  qHS - Start weaning versed gtt after trach wean if able  Gastroparesis with some element ileus: improved - Continue reglan, strengthened bowel regimen (miralax, docusate, fiberpack), QTc okay 1/31   Ischemic changes L hand - wound following, appreciate dressing recommendations - surgical consult for L hand once more stable  Poorly controlled diabetes type II -Continue levemir, SSI; looks okay today  Acute urinary retention Foley Bethanechol  Hypernatremia, fluid balance- metolazone/lasix/diamox as ordered   Daily Goals Checklist  Pain/Anxiety/Delirium protocol (if indicated): see above VAP protocol (if indicated):  bundle in place.  DVT prophylaxis: bivalirudin GI prophylaxis: pantoprazole Glucose control: Euglcyemic on combination of SSI and Basal insulin. Mobility/therapy needs: Mobilization as tolerated, appreciate PT help Code Status: Full code Disposition: ICU.    Patient critically ill due to COVID ARDS Interventions to address this today vent wean, ECMO wean Risk of deterioration without these interventions is high  I personally spent 38 minutes providing critical care not including any separately billable procedures  Myrla Halsted MD [facility] 04/15/2020 7:11 AM Prefer epic messenger for cross cover needs If after hours, please call E-link

## 2020-04-15 NOTE — Progress Notes (Addendum)
OT Cancellation Note  Patient Details Name: Torrey Ballinas MRN: [id number] DOB: [birth year]   Cancelled Treatment:    Reason Eval/Treat Not Completed: Medical issues which prohibited therapy.  Currently undergoing trach change.  OT will try back later as appropriate.    Chiquitta Matty D Shaley Leavens 04/15/2020, 11:34 AM

## 2020-04-15 NOTE — Progress Notes (Signed)
ANTICOAGULATION CONSULT NOTE  Pharmacy Consult for bivalirudin Indication: ECMO and VTE  Labs: Recent Labs    04/13/20 0332 04/13/20 0334 04/13/20 1618 04/13/20 1625 04/14/20 0353 04/14/20 0400 04/14/20 1748 04/14/20 2038 04/15/20 0407 04/15/20 0415  HGB 8.4*   < > 9.0*   < > 7.9*   < > 8.8* 9.2* 8.8* 8.3*  HCT 27.1*   < > 28.2*   < > 24.9*   < > 27.8* 27.0* 26.0* 27.7*  PLT 302  --  316  --  277  --  263  --   --  258  APTT 74*  --  78*  --  76*  --  82*  --   --  78*  LABPROT 17.4*  --   --   --  18.0*  --   --   --   --  17.7*  INR 1.5*  --   --   --  1.6*  --   --   --   --  1.5*  CREATININE 0.53*  --  0.57*  --  0.75  --  0.68  --   --   --    < > = values in this interval not displayed.    Assessment: 35 yoM admitted with COVID-19 PNA with worsening hypoxia, s/p cannulation for ECMO. Pt was started on IV heparin prior to cannulation due to acute DVTs and possible PE, transitioned to bivalirudin with ECMO. Now s/p tPA on 1/5 and tracheostomy on 1/6. Circuit last changed 1/26.  APTT remains therapeutic at 78 seconds and CBC stable.  Goal of Therapy:  aPTT 60-80 seconds   Plan:  -Continue bivalirudin at 0.08 mg/kg/hr (using order-specific wt 72.1kg) -Monitor q12h aPTT/CBC, LDH, and for s/sx of bleeding   Fredonia Highland, PharmD, BCPS, [facility] Clinical Pharmacist [phone] Please check AMION for all [facility] numbers 04/15/2020

## 2020-04-15 NOTE — Procedures (Signed)
Tracheostomy Exchange Procedure Note  Harlon Kutner  [id number]  1970-08-21  Date:04/15/20  Time:8:37 AM   Provider Performing:Meeyah Ovitt Salena Saner Katrinka Blazing   Procedure: Tracheostomy Exchange Through Immature Stoma (18590)  Indication(s) Abnormally seated tracheostomy  Consent Part of ECMO consent, obtained verbal permission  Anesthesia 4mg  versed   Time Out Verified patient identification, verified procedure, site/side was marked, verified correct patient position, special equipment/implants available, medications/allergies/relevant history reviewed, required imaging and test results available.   Sterile Technique Hand hygiene, gloves   Procedure Description Size 8 cuffed existing Shiley removed and Bivona XL placed through stoma.   Complications/Tolerance None; patient tolerated the procedure well..   EBL Minimal

## 2020-04-15 NOTE — Procedures (Signed)
Bronchoscopy Procedure Note  Khari Mally  [id number]  February 08, 1971  Date:04/15/20  Time:8:38 AM   Provider Performing:Beulah Capobianco C Kortnie Stovall   Procedure(s):  Flexible Bronchoscopy [phone])  Indication(s) Check trach placement  Consent Risks of the procedure as well as the alternatives and risks of each were explained to the patient and/or caregiver.  Consent for the procedure was obtained and is signed in the bedside chart  Anesthesia In place for trach exchange   Time Out Verified patient identification, verified procedure, site/side was marked, verified correct patient position, special equipment/implants available, medications/allergies/relevant history reviewed, required imaging and test results available.   Sterile Technique Usual hand hygiene, masks, gowns, and gloves were used   Procedure Description Bronchoscope advanced through tracheostomy tube and into airway.  Airways were examined down to subsegmental level with findings noted below.     Findings:  - Severe acute bronchitis changes - Tracheostomy tip 2.5cm above carina  Complications/Tolerance None; patient tolerated the procedure well. Chest X-ray is needed post procedure.   EBL Minimal   Specimen(s) none

## 2020-04-15 NOTE — Progress Notes (Signed)
Patient ID: Alex [name], male   DOB: September 25, 1970, 50 y.o.   MRN: [id number]     Advanced Heart Failure Rounding Note  PCP-Cardiologist: No primary care provider on file.   Subjective:    - 12/30: VV ECMO cannulation - 12/31: Left chest tube replaced - 1/2: Extubated. Echo with EF 60-65%, mildly dilated RV with mildly decreased systolic function.  - 1/4: Agitated, suspected aspiration.  Re-intubated.  - 1/5: ECMO cannula repositioned under TEE guidance. TEE showed moderately dilated/moderate-severely dysfunctional RV in setting of hypoxemia. LUE DVT found.  Patient got 1/2 dose of TPA due to initial concern for large PE.  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  - 1/6: Tracheostomy - 1/7: Echo with mild RV dilation/mild RV dysfunction.  - 1/16: Left chest tube out - 1/17: LUE arterial dopplers repeated, showed no obstruction.  - 1/20: Echo with EF 65-70%, mildly D-shaped septum, mildly dilated and mildly dysfunctional RV.  - 1/22: CTA chest: Bilateral upper lobe PEs (suspect chronic), changes c/w ARDS - 1/23: Bronchoscopy showed semi-occlusive ?mass/polyp in the trachea.  - 1/24: Bronchoscopy showed resolution of 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 - 1/26: ECMO circuit changed.  - 2/1: CT chest showed diffuse bronchiectasis as well as diffuse opacity consistent with COVID-19 PNA with ARDS.  Sweep lower at 4 today, stable CXR.  Cough seems improved.    He is now off abx. WBCs 16 (lower), afebrile.    I/Os positive and weight up, no diuretics yesterday.  HCO3 32.   Trach collar trial for about 45 minutes yesterday.   ECMO parameters: 3400 rpm Flow 4.5 L/min Pvenous -77 Delta P 28 Sweep 4 FiO2 0.4 VT 380  ABG 7.37/65/124/99% LDH  1,117 => 911 => 751 => 699 => 691 => 534 => 488 => 547 => 461 => 474 => 489 => 553 => 449 => 457 PTT 76 Lactate 0.8  Objective:   Weight Range: 69.1 kg Body mass index is 26.15 kg/m.   Vital Signs:   Temp:  [98.4 F (36.9 C)-98.6 F  (37 C)] (P) 98.6 F (37 C) (02/03 0400) Pulse Rate:  [95-110] 95 (02/03 0600) Resp:  [0-37] 17 (02/03 0600) BP: (98-175)/(63-95) 108/76 (02/03 0600) SpO2:  [83 %-100 %] 100 % (02/03 0600) Arterial Line BP: (94-175)/(51-83) 125/61 (02/03 0600) FiO2 (%):  [40 %] 40 % (02/03 0422) Weight:  [69.1 kg] 69.1 kg (02/03 0500) Last BM Date: 04/13/20  Weight change: Filed Weights   04/13/20 0437 04/14/20 0500 04/15/20 0500  Weight: 69.7 kg 66.1 kg 69.1 kg    Intake/Output:   Intake/Output Summary (Last 24 hours) at 04/15/2020 0831 Last data filed at 04/15/2020 0656 Gross per 24 hour  Intake 3482.71 ml  Output 1390 ml  Net 2092.71 ml      Physical Exam    General: NAD, sedated on vent Neck: Trach. No JVD, no thyromegaly or thyroid nodule.  Lungs: Clear to auscultation bilaterally with normal respiratory effort. CV: Nondisplaced PMI.  Heart regular S1/S2, no S3/S4, no murmur.  No peripheral edema.   Abdomen: Soft, nontender, no hepatosplenomegaly, no distention.  Skin: Intact without lesions or rashes.  Neurologic: Will wake up and follow commands.  Extremities: Dry gangrene tips left fingers.  HEENT: Normal.    Telemetry   NSR 100s Personally reviewed   Labs    CBC Recent Labs    04/14/20 1748 04/14/20 2038 04/15/20 0407 04/15/20 0415  WBC 21.1*  --   --  16.1*  HGB 8.8*   < >  8.8* 8.3*  HCT 27.8*   < > 26.0* 27.7*  MCV 95.9  --   --  97.5  PLT 263  --   --  258   < > = values in this interval not displayed.   Basic Metabolic Panel Recent Labs    04/14/20 1748 04/14/20 2038 04/15/20 0407 04/15/20 0415  NA 146*   < > 146* 146*  K 4.7   < > 4.5 4.5  CL 107  --   --  105  CO2 32  --   --  32  GLUCOSE 156*  --   --  153*  BUN 63*  --   --  55*  CREATININE 0.68  --   --  0.74  CALCIUM 8.8*  --   --  8.9   < > = values in this interval not displayed.   Liver Function Tests No results for input(s): AST, ALT, ALKPHOS, BILITOT, PROT, ALBUMIN in the last 72  hours. No results for input(s): LIPASE, AMYLASE in the last 72 hours. Cardiac Enzymes No results for input(s): CKTOTAL, CKMB, CKMBINDEX, TROPONINI in the last 72 hours.  BNP: BNP (last 3 results) No results for input(s): BNP in the last 8760 hours.  ProBNP (last 3 results) No results for input(s): PROBNP in the last 8760 hours.   D-Dimer No results for input(s): DDIMER in the last 72 hours. Hemoglobin A1C No results for input(s): HGBA1C in the last 72 hours. Fasting Lipid Panel No results for input(s): CHOL, HDL, LDLCALC, TRIG, CHOLHDL, LDLDIRECT in the last 72 hours. Thyroid Function Tests No results for input(s): TSH, T4TOTAL, T3FREE, THYROIDAB in the last 72 hours.  Invalid input(s): FREET3  Other results:   Imaging    DG CHEST PORT 1 VIEW  Result Date: 04/15/2020 CLINICAL DATA:  Cardiac respiratory problems. EXAM: PORTABLE CHEST 1 VIEW COMPARISON:  04/14/2020.  CT 04/13/2020. FINDINGS: Tracheostomy tube, feeding tube, right PICC line, ECMO device in stable position. Heart size stable. Diffuse progressive bilateral pulmonary infiltrates/edema. Pleural effusions cannot be completely excluded. No pneumothorax. IMPRESSION: 1. Lines and tubes in stable position. 2. Diffuse progressive bilateral pulmonary infiltrates/edema. Electronically Signed   By: Marcello Moores  Register   On: 04/15/2020 05:43     Medications:     Scheduled Medications: . acetaZOLAMIDE  500 mg Oral BID  . carvedilol  25 mg Oral BID WC  . chlorhexidine gluconate (MEDLINE KIT)  15 mL Mouth Rinse BID  . Chlorhexidine Gluconate Cloth  6 each Topical Daily  . chlorpheniramine-HYDROcodone  5 mL Per Tube Q12H  . clonazePAM  1 mg Per Tube Daily  . clonazePAM  3 mg Per Tube QHS  . cloNIDine  0.1 mg Per Tube Q8H  . dextromethorphan  30 mg Per Tube TID  . diltiazem  30 mg Per Tube Q6H  . docusate  100 mg Per Tube BID  . fiber  1 packet Per Tube BID  . free water  200 mL Per Tube Q4H  . furosemide  40 mg  Intravenous BID  . gabapentin  600 mg Per Tube Q8H  . insulin aspart  0-20 Units Subcutaneous Q4H  . insulin aspart  4 Units Subcutaneous Q4H  . insulin detemir  38 Units Subcutaneous BID  . lactobacillus acidophilus  2 tablet Per Tube TID  . liver oil-zinc oxide   Topical 5 X Daily  . mouth rinse  15 mL Mouth Rinse 10 times per day  . melatonin  3 mg Per Tube QHS  .  metoCLOPramide (REGLAN) injection  10 mg Intravenous Q8H  . nutrition supplement (JUVEN)  1 packet Per Tube BID BM  . nystatin  5 mL Mouth/Throat QID  . oxyCODONE  15 mg Per Tube Q6H  . pantoprazole sodium  40 mg Per Tube QHS  . QUEtiapine  300 mg Per Tube QHS  . sennosides  10 mL Per Tube QHS  . sodium chloride flush  10-40 mL Intracatheter Q12H    Infusions: . sodium chloride    . sodium chloride 10 mL/hr at 04/15/20 0656  . sodium chloride 250 mL (04/07/20 2203)  . sodium chloride Stopped (03/12/20 0131)  . albumin human 12.5 g (04/11/20 1558)  . bivalirudin (ANGIOMAX) infusion 0.5 mg/mL (Non-ACS indications) 0.08 mg/kg/hr (04/15/20 0656)  . feeding supplement (PIVOT 1.5 CAL) 1,000 mL (04/14/20 0031)  . midazolam 2 mg/hr (04/15/20 0422)    PRN Medications: Place/Maintain arterial line **AND** sodium chloride, sodium chloride, sodium chloride, acetaminophen (TYLENOL) oral liquid 160 mg/5 mL, albumin human, [DISCONTINUED] lidocaine **AND** albuterol, guaiFENesin, hydrALAZINE, labetalol, lip balm, midazolam, midazolam, morphine CONCENTRATE, ondansetron (ZOFRAN) IV, phenol, polyethylene glycol, simethicone, sodium chloride flush   Assessment/Plan   1. Acute hypoxemic respiratory failure: Due to COVID-19 PNA with bilateral infiltrates.  Refractory hypoxemia, VV-ECMO cannulation on 03/06/2020 with improvement in oxygenation.  Developed left PTX post-subclavian CVL and had left chest tube, the left lung is re-expanded and CT out.  He was extubated 1/2 but reintubated 1/4 with agitation and suspected aspiration.   Tracheostomy 1/6.  CTA chest 1/22 with suspected chronic PEs and ARDS. ECMO cannula repositioned 1/5. ECMO circuit changed 1/26. LDH stable. Sweep at 4, have struggled with hypercarbia from significant dead space ventilation.  Now off abx with WBCs trending down. PCO2 65. HCO3 32.  I/Os negative with Lasix.  Cough remains a major problem though better today. - Will change trach today (could not get the correct trach yesterday) to try to lessen irritation triggering cough. - Slowly wean sweep, hopefully can continue to go down. Wean sedation.  - Lasix 40 mg IV bid with Diamox 500 bid today.   - Patient has had remdesivir, tocilizumab. - Completed steroid taper. - Continue bivalirudin, goal PTT 65-80. He is at 46 today. Discussed dosing with PharmD personally.  - Continue to mobilize  - Coreg increased to 25 mg bid to increase fractional flow via ECMO circuit. Diltiazem 30 q6 added.  2. 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  Clot noted on TEE in RV as well.  TTE 1/2 showed normal EF 60-65%, RV improved (mildly dilated/dysfunctional). TEE on 1/5 with moderate to severe RV dysfunction but patient was hypoxemic.  Had 1/2 dose TPA on 1/5. Echo 1/20 with mildly dilated/mildly dysfunctional RV. CTA chest 1/22 with chronic-appearing PEs in upper lobes. - Bivalirudin for goal PTT 65-80.  He is 80 today. Discussed dosing with PharmD personally. 3. Left PTX: Left chest tube, lung is re-expanded. Tube now out, stable CXR. 4. Shock: Suspect septic/distributive.  Now resolved, off NE.  5. Anemia: Hgb 8.4, transfuse < 8.   6. AKI: Resolved.  7. Hyperglycemia: insulin.  8. HTN: BP stable.   - On clonidine.  - Continue Coreg 25 mg bid.   - Diltiazem 30 q6 hrs - Suspect arterial line inaccurate, following cuff for now.  9. CHB: Episode of CHB when hypoxemic and with cough (suspect vagal).  NSR since then.    - Continue Coreg and diltiazem, watch  rhythm.  10. Thrombocytopenia: Resolved  11. Ileus: Resolved. TFs restarted.  - Getting Reglan  - Cor-trak repositioned to post-pyloric placement 12. Ischemic digits: LUE.  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  Repeat study 1/17 showed no obstruction.  - Wound Care following. 13. ID: Group F Strep in sputum.  Also with Enterococcus faecalis in sputum. Completed abx. WBCs lower at 16 with PCT 0.53 on 2/2.   - Sent trach aspirate culture.  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   15. Hypernatremia: Continue free water.   CRITICAL CARE Performed by: Loralie Champagne  Total critical care time: 40 minutes  Critical care time was exclusive of separately billable procedures and treating other patients.  Critical care was necessary to treat or prevent imminent or life-threatening deterioration.  Critical care was time spent personally by me on the following activities: development of treatment plan with patient and/or surrogate as well as nursing, discussions with consultants, evaluation of patient's response to treatment, examination of patient, obtaining history from patient or surrogate, ordering and performing treatments and interventions, ordering and review of laboratory studies, ordering and review of radiographic studies, pulse oximetry and re-evaluation of patient's condition.    Length of Stay: [place], MD  04/15/2020, 8:31 AM  Advanced Heart Failure Team Pager [phone] (M-F; 7a - 4p)  Please contact [facility] for night-coverage after hours (4p -7a ) and weekends on [email or web address]

## 2020-04-15 NOTE — Procedures (Signed)
Tracheostomy Change Note  Patient Details:   Name: Alex [name] DOB: [birth year] MRN: [id number]    Airway Documentation:     Evaluation  O2 sats: stable throughout Complications: No apparent complications Patient did tolerate procedure well. Bilateral Breath Sounds: Diminished     CCM MD and RT changed patient's trach to #6 [place] cuffed with no complications. Positive color change noted on ETCO2 detector. VSS. RT will continue to monitor.   Susie Ehresman Lajuana Ripple 04/15/2020, 12:56 PM

## 2020-04-15 NOTE — Progress Notes (Signed)
ANTICOAGULATION CONSULT NOTE  Pharmacy Consult for bivalirudin Indication: ECMO and VTE  Labs: Recent Labs    04/13/20 0332 04/13/20 0334 04/14/20 0353 04/14/20 0400 04/14/20 1748 04/14/20 2038 04/15/20 0415 04/15/20 0936 04/15/20 1410 04/15/20 1608  HGB 8.4*   < > 7.9*   < > 8.8*   < > 8.3* 8.8* 8.8* 8.0*  HCT 27.1*   < > 24.9*   < > 27.8*   < > 27.7* 26.0* 26.0* 25.6*  PLT 302   < > 277  --  263  --  258  --   --  259  APTT 74*   < > 76*  --  82*  --  78*  --   --  79*  LABPROT 17.4*  --  18.0*  --   --   --  17.7*  --   --   --   INR 1.5*  --  1.6*  --   --   --  1.5*  --   --   --   CREATININE 0.53*   < > 0.75  --  0.68  --  0.74  --   --  0.71   < > = values in this interval not displayed.    Assessment: 48 yoM admitted with COVID-19 PNA with worsening hypoxia, s/p cannulation for ECMO. Pt was started on IV heparin prior to cannulation due to acute DVTs and possible PE, transitioned to bivalirudin with ECMO. Now s/p tPA on 1/5 and tracheostomy on 1/6. Circuit last changed 1/26.  APTT remains therapeutic at 79 seconds and CBC stable.  Goal of Therapy:  aPTT 60-80 seconds   Plan:  -Continue bivalirudin at 0.08 mg/kg/hr (using order-specific wt 72.1kg) -Monitor q12h aPTT/CBC, LDH, and for s/sx of bleeding  Elmer Sow, PharmD, BCPS, BCCCP Clinical Pharmacist [phone]  Please check AMION for all [facility] Pharmacy numbers  04/15/2020 8:42 PM

## 2020-04-15 NOTE — Procedures (Signed)
Extracorporeal support note  ECLS cannulation date: 02/17/2020 Last circuit change: 04/07/2020  Indication: Acute hypoxic respiratory failure due to ARDS from COVID-19 pneumonia with RV dysfunction.   Configuration: Venovenous  Drainage cannula: 32 French crescent cannula via right IJ Return cannula: Same  Pump speed: 3400 RPM Pump flow: 4.58 L/min Pump used: Cardio help  Oxygenator: Cardio help O2 blender: 100% Sweep gas: 4L  Circuit check: No clots Anticoagulant: Bivalirudin Anticoagulation targets: PTT 60-80  Changes in support:  Wean sweep, consider trach changeout to see if cough improves  Anticipated goals/duration of support: Bridge to recovery.  Multidisciplinary ECMO rounds completed.  Myrla Halsted MD PCCM 04/15/2020

## 2020-04-15 NOTE — Progress Notes (Signed)
RT note- RN concerned with air leak and low VT's, PC increased to 18, now with volumes 180'sml, continue to monitor.

## 2020-04-16 ENCOUNTER — Inpatient Hospital Stay (HOSPITAL_COMMUNITY): Payer: Medicaid Other

## 2020-04-16 LAB — LACTIC ACID, PLASMA
Lactic Acid, Venous: 0.7 mmol/L (ref 0.5–1.9)
Lactic Acid, Venous: 0.5 mmol/L (ref 0.5–1.9)

## 2020-04-16 LAB — POCT I-STAT 7, (LYTES, BLD GAS, ICA,H+H)
Acid-Base Excess: 3 mmol/L — ABNORMAL HIGH (ref 0.0–2.0)
Acid-Base Excess: 6 mmol/L — ABNORMAL HIGH (ref 0.0–2.0)
Acid-Base Excess: 6 mmol/L — ABNORMAL HIGH (ref 0.0–2.0)
Acid-Base Excess: 7 mmol/L — ABNORMAL HIGH (ref 0.0–2.0)
Acid-Base Excess: 8 mmol/L — ABNORMAL HIGH (ref 0.0–2.0)
Acid-Base Excess: 8 mmol/L — ABNORMAL HIGH (ref 0.0–2.0)
Acid-Base Excess: 9 mmol/L — ABNORMAL HIGH (ref 0.0–2.0)
Acid-Base Excess: 9 mmol/L — ABNORMAL HIGH (ref 0.0–2.0)
Bicarbonate: 32.4 mmol/L — ABNORMAL HIGH (ref 20.0–28.0)
Bicarbonate: 34.3 mmol/L — ABNORMAL HIGH (ref 20.0–28.0)
Bicarbonate: 34.6 mmol/L — ABNORMAL HIGH (ref 20.0–28.0)
Bicarbonate: 34.7 mmol/L — ABNORMAL HIGH (ref 20.0–28.0)
Bicarbonate: 34.8 mmol/L — ABNORMAL HIGH (ref 20.0–28.0)
Bicarbonate: 35.6 mmol/L — ABNORMAL HIGH (ref 20.0–28.0)
Bicarbonate: 35.9 mmol/L — ABNORMAL HIGH (ref 20.0–28.0)
Bicarbonate: 36.3 mmol/L — ABNORMAL HIGH (ref 20.0–28.0)
Calcium, Ion: 1.26 mmol/L (ref 1.15–1.40)
Calcium, Ion: 1.26 mmol/L (ref 1.15–1.40)
Calcium, Ion: 1.27 mmol/L (ref 1.15–1.40)
Calcium, Ion: 1.27 mmol/L (ref 1.15–1.40)
Calcium, Ion: 1.28 mmol/L (ref 1.15–1.40)
Calcium, Ion: 1.32 mmol/L (ref 1.15–1.40)
Calcium, Ion: 1.32 mmol/L (ref 1.15–1.40)
Calcium, Ion: 1.33 mmol/L (ref 1.15–1.40)
HCT: 22 % — ABNORMAL LOW (ref 39.0–52.0)
HCT: 23 % — ABNORMAL LOW (ref 39.0–52.0)
HCT: 30 % — ABNORMAL LOW (ref 39.0–52.0)
HCT: 31 % — ABNORMAL LOW (ref 39.0–52.0)
HCT: 31 % — ABNORMAL LOW (ref 39.0–52.0)
HCT: 31 % — ABNORMAL LOW (ref 39.0–52.0)
HCT: 31 % — ABNORMAL LOW (ref 39.0–52.0)
HCT: 33 % — ABNORMAL LOW (ref 39.0–52.0)
Hemoglobin: 10.2 g/dL — ABNORMAL LOW (ref 13.0–17.0)
Hemoglobin: 10.5 g/dL — ABNORMAL LOW (ref 13.0–17.0)
Hemoglobin: 10.5 g/dL — ABNORMAL LOW (ref 13.0–17.0)
Hemoglobin: 10.5 g/dL — ABNORMAL LOW (ref 13.0–17.0)
Hemoglobin: 10.5 g/dL — ABNORMAL LOW (ref 13.0–17.0)
Hemoglobin: 11.2 g/dL — ABNORMAL LOW (ref 13.0–17.0)
Hemoglobin: 7.5 g/dL — ABNORMAL LOW (ref 13.0–17.0)
Hemoglobin: 7.8 g/dL — ABNORMAL LOW (ref 13.0–17.0)
O2 Saturation: 87 %
O2 Saturation: 89 %
O2 Saturation: 90 %
O2 Saturation: 90 %
O2 Saturation: 99 %
O2 Saturation: 99 %
O2 Saturation: 99 %
O2 Saturation: 99 %
Patient temperature: 36.8
Patient temperature: 36.8
Patient temperature: 36.9
Patient temperature: 36.9
Patient temperature: 36.9
Patient temperature: 37
Patient temperature: 98.8
Potassium: 3.8 mmol/L (ref 3.5–5.1)
Potassium: 3.9 mmol/L (ref 3.5–5.1)
Potassium: 4 mmol/L (ref 3.5–5.1)
Potassium: 4.1 mmol/L (ref 3.5–5.1)
Potassium: 4.4 mmol/L (ref 3.5–5.1)
Potassium: 4.4 mmol/L (ref 3.5–5.1)
Potassium: 4.5 mmol/L (ref 3.5–5.1)
Potassium: 5.6 mmol/L — ABNORMAL HIGH (ref 3.5–5.1)
Sodium: 145 mmol/L (ref 135–145)
Sodium: 145 mmol/L (ref 135–145)
Sodium: 145 mmol/L (ref 135–145)
Sodium: 146 mmol/L — ABNORMAL HIGH (ref 135–145)
Sodium: 146 mmol/L — ABNORMAL HIGH (ref 135–145)
Sodium: 146 mmol/L — ABNORMAL HIGH (ref 135–145)
Sodium: 146 mmol/L — ABNORMAL HIGH (ref 135–145)
Sodium: 147 mmol/L — ABNORMAL HIGH (ref 135–145)
TCO2: 35 mmol/L — ABNORMAL HIGH (ref 22–32)
TCO2: 36 mmol/L — ABNORMAL HIGH (ref 22–32)
TCO2: 36 mmol/L — ABNORMAL HIGH (ref 22–32)
TCO2: 37 mmol/L — ABNORMAL HIGH (ref 22–32)
TCO2: 37 mmol/L — ABNORMAL HIGH (ref 22–32)
TCO2: 38 mmol/L — ABNORMAL HIGH (ref 22–32)
TCO2: 38 mmol/L — ABNORMAL HIGH (ref 22–32)
TCO2: 39 mmol/L — ABNORMAL HIGH (ref 22–32)
pCO2 arterial: 56.5 mmHg — ABNORMAL HIGH (ref 32.0–48.0)
pCO2 arterial: 57 mmHg — ABNORMAL HIGH (ref 32.0–48.0)
pCO2 arterial: 62.9 mmHg — ABNORMAL HIGH (ref 32.0–48.0)
pCO2 arterial: 66 mmHg (ref 32.0–48.0)
pCO2 arterial: 67.6 mmHg (ref 32.0–48.0)
pCO2 arterial: 74.5 mmHg (ref 32.0–48.0)
pCO2 arterial: 75.1 mmHg (ref 32.0–48.0)
pCO2 arterial: 84.8 mmHg (ref 32.0–48.0)
pH, Arterial: 7.238 — ABNORMAL LOW (ref 7.350–7.450)
pH, Arterial: 7.243 — ABNORMAL LOW (ref 7.350–7.450)
pH, Arterial: 7.274 — ABNORMAL LOW (ref 7.350–7.450)
pH, Arterial: 7.319 — ABNORMAL LOW (ref 7.350–7.450)
pH, Arterial: 7.34 — ABNORMAL LOW (ref 7.350–7.450)
pH, Arterial: 7.364 (ref 7.350–7.450)
pH, Arterial: 7.387 (ref 7.350–7.450)
pH, Arterial: 7.395 (ref 7.350–7.450)
pO2, Arterial: 135 mmHg — ABNORMAL HIGH (ref 83.0–108.0)
pO2, Arterial: 149 mmHg — ABNORMAL HIGH (ref 83.0–108.0)
pO2, Arterial: 159 mmHg — ABNORMAL HIGH (ref 83.0–108.0)
pO2, Arterial: 171 mmHg — ABNORMAL HIGH (ref 83.0–108.0)
pO2, Arterial: 61 mmHg — ABNORMAL LOW (ref 83.0–108.0)
pO2, Arterial: 65 mmHg — ABNORMAL LOW (ref 83.0–108.0)
pO2, Arterial: 66 mmHg — ABNORMAL LOW (ref 83.0–108.0)
pO2, Arterial: 67 mmHg — ABNORMAL LOW (ref 83.0–108.0)

## 2020-04-16 LAB — CBC
HCT: 26 % — ABNORMAL LOW (ref 39.0–52.0)
HCT: 34.3 % — ABNORMAL LOW (ref 39.0–52.0)
Hemoglobin: 7.7 g/dL — ABNORMAL LOW (ref 13.0–17.0)
Hemoglobin: 10.6 g/dL — ABNORMAL LOW (ref 13.0–17.0)
MCH: 29.5 pg (ref 26.0–34.0)
MCH: 30.1 pg (ref 26.0–34.0)
MCHC: 29.6 g/dL — ABNORMAL LOW (ref 30.0–36.0)
MCHC: 30.9 g/dL (ref 30.0–36.0)
MCV: 99.6 fL (ref 80.0–100.0)
MCV: 97.4 fL (ref 80.0–100.0)
Platelets: 239 10*3/uL (ref 150–400)
Platelets: 306 10*3/uL (ref 150–400)
RBC: 2.61 MIL/uL — ABNORMAL LOW (ref 4.22–5.81)
RBC: 3.52 MIL/uL — ABNORMAL LOW (ref 4.22–5.81)
RDW: 18.6 % — ABNORMAL HIGH (ref 11.5–15.5)
RDW: 18.6 % — ABNORMAL HIGH (ref 11.5–15.5)
WBC: 11.1 10*3/uL — ABNORMAL HIGH (ref 4.0–10.5)
WBC: 17.2 10*3/uL — ABNORMAL HIGH (ref 4.0–10.5)
nRBC: 0.3 % — ABNORMAL HIGH (ref 0.0–0.2)
nRBC: 0.4 % — ABNORMAL HIGH (ref 0.0–0.2)

## 2020-04-16 LAB — BASIC METABOLIC PANEL
Anion gap: 10 (ref 5–15)
Anion gap: 11 (ref 5–15)
BUN: 64 mg/dL — ABNORMAL HIGH (ref 6–20)
BUN: 67 mg/dL — ABNORMAL HIGH (ref 6–20)
CO2: 30 mmol/L (ref 22–32)
CO2: 32 mmol/L (ref 22–32)
Calcium: 8.8 mg/dL — ABNORMAL LOW (ref 8.9–10.3)
Calcium: 9.3 mg/dL (ref 8.9–10.3)
Chloride: 105 mmol/L (ref 98–111)
Chloride: 104 mmol/L (ref 98–111)
Creatinine, Ser: 0.79 mg/dL (ref 0.61–1.24)
Creatinine, Ser: 0.77 mg/dL (ref 0.61–1.24)
GFR, Estimated: >60 mL/min (ref >60)
GFR, Estimated: >60 mL/min (ref >60)
Glucose, Bld: 147 mg/dL — ABNORMAL HIGH (ref 70–99)
Glucose, Bld: 123 mg/dL — ABNORMAL HIGH (ref 70–99)
Potassium: 3.7 mmol/L (ref 3.5–5.1)
Potassium: 4.5 mmol/L (ref 3.5–5.1)
Sodium: 145 mmol/L (ref 135–145)
Sodium: 147 mmol/L — ABNORMAL HIGH (ref 135–145)

## 2020-04-16 LAB — GLUCOSE, CAPILLARY
Glucose-Capillary: 104 mg/dL — ABNORMAL HIGH (ref 70–99)
Glucose-Capillary: 111 mg/dL — ABNORMAL HIGH (ref 70–99)
Glucose-Capillary: 120 mg/dL — ABNORMAL HIGH (ref 70–99)
Glucose-Capillary: 146 mg/dL — ABNORMAL HIGH (ref 70–99)
Glucose-Capillary: 161 mg/dL — ABNORMAL HIGH (ref 70–99)
Glucose-Capillary: 165 mg/dL — ABNORMAL HIGH (ref 70–99)

## 2020-04-16 LAB — FIBRINOGEN: Fibrinogen: 519 mg/dL — ABNORMAL HIGH (ref 210–475)

## 2020-04-16 LAB — APTT
aPTT: 68 seconds — ABNORMAL HIGH (ref 24–36)
aPTT: 73 seconds — ABNORMAL HIGH (ref 24–36)

## 2020-04-16 LAB — CULTURE, RESPIRATORY W GRAM STAIN: Culture: NORMAL

## 2020-04-16 LAB — PROTIME-INR
INR: 1.6 — ABNORMAL HIGH (ref 0.8–1.2)
Prothrombin Time: 18.2 seconds — ABNORMAL HIGH (ref 11.4–15.2)

## 2020-04-16 LAB — LACTATE DEHYDROGENASE: LDH: 421 U/L — ABNORMAL HIGH (ref 98–192)

## 2020-04-16 LAB — PREPARE RBC (CROSSMATCH)

## 2020-04-16 MED ORDER — POTASSIUM CHLORIDE 20 MEQ PO PACK
40.0000 meq | PACK | Freq: Once | ORAL | Status: AC
  Administered 2020-04-16: 40 meq
  Filled 2020-04-16: qty 2

## 2020-04-16 MED ORDER — SODIUM CHLORIDE 0.9% IV SOLUTION: Freq: Once | INTRAVENOUS | Status: AC

## 2020-04-16 MED ORDER — ACETAZOLAMIDE 250 MG PO TABS
500.0000 mg | ORAL_TABLET | Freq: Two times a day (BID) | ORAL | Status: AC
  Administered 2020-04-16 (×2): 500 mg via ORAL
  Filled 2020-04-16 (×2): qty 2

## 2020-04-16 MED ORDER — FUROSEMIDE 10 MG/ML IJ SOLN
3.0000 mg/h | INTRAVENOUS | Status: DC
  Administered 2020-04-16: 6 mg/h via INTRAVENOUS
  Administered 2020-04-17: 3 mg/h via INTRAVENOUS
  Filled 2020-04-16 (×2): qty 20

## 2020-04-16 MED ORDER — ORAL CARE MOUTH RINSE
15.0000 mL | Freq: Two times a day (BID) | OROMUCOSAL | Status: DC
  Administered 2020-04-16 – 2020-05-08 (×42): 15 mL via OROMUCOSAL

## 2020-04-16 MED ORDER — QUETIAPINE FUMARATE 100 MG PO TABS: 100.0000 mg | ORAL_TABLET | Freq: Every evening | ORAL | Status: DC | PRN

## 2020-04-16 MED ORDER — POTASSIUM CHLORIDE 20 MEQ PO PACK
60.0000 meq | PACK | Freq: Once | ORAL | Status: AC
  Administered 2020-04-16: 60 meq
  Filled 2020-04-16: qty 3

## 2020-04-16 MED ORDER — GABAPENTIN 250 MG/5ML PO SOLN
900.0000 mg | Freq: Three times a day (TID) | ORAL | Status: DC
  Administered 2020-04-16 – 2020-04-22 (×19): 900 mg
  Filled 2020-04-16 (×27): qty 18

## 2020-04-16 MED ORDER — CHLORHEXIDINE GLUCONATE 0.12 % MT SOLN
15.0000 mL | Freq: Two times a day (BID) | OROMUCOSAL | Status: DC
  Administered 2020-04-16 – 2020-05-08 (×45): 15 mL via OROMUCOSAL
  Filled 2020-04-16 (×35): qty 15

## 2020-04-16 NOTE — Progress Notes (Signed)
Orthopedic Tech Progress Note Patient Details:  Alex [name] March 14, 1970 [id number] RN called requesting another PRAFO BOOT. Stick stand was broken so she needed a new one Ortho Devices Type of Ortho Device: Prafo boot/shoe Ortho Device/Splint Location: BLE Ortho Device/Splint Interventions: Other (comment)   Post Interventions Patient Tolerated: Other (comment) Instructions Provided: Other (comment)   Donald Pore 04/16/2020, 12:31 PM

## 2020-04-16 NOTE — Progress Notes (Signed)
Physical Therapy Treatment Patient Details Name: Alex [name] MRN: [id number] DOB: 09/09/1970 Today's Date: 04/16/2020    History of Present Illness Pt is 50 y.o. male  with no significant past medical history admitted on 03/01/2020 with dyspnea, cough, nausea/vomiting ~ 1 week ago with worsening symptoms of body aches and fatigue and +COVID 02/07/20 and admitted with shortness of breath. Required intubation 03/10/20. Cannulated for VV ECMO 03/18/2020. Oxygenating better with ECMO. Also evidence of RLE DVT, RV thrombus, and high suspicion of PE. Has required chest tube to L lung for collapse which needs further reposition with recurrent collapse. Extubated 03-15-19.  Reintubated 1/5 and trach placed 1/6. Decannulated 04/16/20.    PT Comments    Pt admitted with above diagnosis. Pt sat EOB with 20 min with min to mod assist for sitting posture. Pt had episodes of holding his neck into extension as well as performing UE and LE exercises.  Tolerated fairly well with pt on 10L intiially and incr to 12L HFNC with activity.  Other VSS.  Pt decannulated 04/16/20.  Pt currently with functional limitations due to balance and endurance deficits. Pt will benefit from skilled PT to increase their independence and safety with mobility to allow discharge to the venue listed below.     Follow Up Recommendations  CIR;Supervision/Assistance - 24 hour     Equipment Recommendations  Other (comment) (TBD (pt still on ECMO))    Recommendations for Other Services       Precautions / Restrictions Precautions Precautions: Fall Precaution Comments: ECMO, NGtube, Foley, Pt decannulated himself 2/4 Required Braces or Orthoses: Splint/Cast Splint/Cast: Left resting hand splint to be worn for 4 hours at night (8pm-12am) with RN to check thumb at end of wearing time for any pressure issues and contact OT department if so (numbers left in orders section of chart) Restrictions Weight Bearing Restrictions: No    Mobility   Bed Mobility Overal bed mobility: Needs Assistance Bed Mobility: Supine to Sit;Sit to Supine Rolling: Max assist;+2 for physical assistance;+2 for safety/equipment Sidelying to sit: Total assist;+2 for physical assistance Supine to sit: Total assist;+2 for physical assistance   Sit to sidelying: Total assist;+2 for physical assistance (extra 2 persons for lines) General bed mobility comments: A for legs and trunk: PT/OT assisted with mobility, nursing  assist with ECMO lines  Transfers                 General transfer comment: NT  Ambulation/Gait             General Gait Details: TBA   Stairs             Wheelchair Mobility    Modified Rankin (Stroke Patients Only)       Balance Overall balance assessment: Needs assistance Sitting-balance support: No upper extremity supported;Feet supported;Bilateral upper extremity supported Sitting balance-Leahy Scale: Poor Sitting balance - Comments: Varied from min A-mod A with left lateral lean as time progressed ; Sat EOB 20 minutes; Pt performed neck extension, LE LAQ with ability to sustain left LE into full knee extension for up to 8 seconds at least once, could not sustain right LE full extension but did kick the LE.  attempts to move left UE and grip with left UE; Pt gripped PT with right UE and did push and pull PT away. Postural control: Left lateral lean  Cognition Arousal/Alertness: Awake/alert Behavior During Therapy: Flat affect;Restless Overall Cognitive Status: Difficult to assess                                 General Comments: Appears fatigued; inconsistently following 1 step commands      Exercises General Exercises - Upper Extremity Shoulder Flexion: AROM;Both;10 reps;Seated Elbow Flexion: Both;10 reps;Seated;AROM;AAROM;Strengthening Elbow Extension: AROM;AAROM;Strengthening;Both;10 reps;Seated Wrist Flexion: AROM;AAROM;Both;10  reps;Seated Digit Composite Flexion: AROM;AAROM;Both;10 reps;Seated General Exercises - Lower Extremity Long Arc Quad: Both;Seated;AAROM;10 reps Other Exercises Other Exercises: Neck extension in sitting    General Comments General comments (skin integrity, edema, etc.): ROM and dressings assess; tubular guaze applied to fingers with increase ROM allowed; attmepted to reposition slint. Difficult to achieve good fit due to small hand, short digits and amount of edema. will most likley need custom splint      Pertinent Vitals/Pain Pain Assessment: Faces Faces Pain Scale: Hurts little more Pain Location: generalized Pain Descriptors / Indicators: Discomfort Pain Intervention(s): Limited activity within patient's tolerance;Monitored during session;Repositioned    Home Living                      Prior Function            PT Goals (current goals can now be found in the care plan section) Acute Rehab PT Goals Patient Stated Goal: unable to state Progress towards PT goals: Progressing toward goals    Frequency    Min 3X/week      PT Plan Current plan remains appropriate    Co-evaluation              AM-PAC PT "6 Clicks" Mobility   Outcome Measure  Help needed turning from your back to your side while in a flat bed without using bedrails?: Total Help needed moving from lying on your back to sitting on the side of a flat bed without using bedrails?: Total Help needed moving to and from a bed to a chair (including a wheelchair)?: Total Help needed standing up from a chair using your arms (e.g., wheelchair or bedside chair)?: Total Help needed to walk in hospital room?: Total Help needed climbing 3-5 steps with a railing? : Total 6 Click Score: 6    End of Session   Activity Tolerance: Patient limited by fatigue Patient left: in bed;with nursing/sitter in room;with call bell/phone within reach (left bed in chair position) Nurse Communication: Other  (comment) (nurse in room) PT Visit Diagnosis: Muscle weakness (generalized) (M62.81);Pain Pain - part of body:  (chest)     Time: 3354-5625 PT Time Calculation (min) (ACUTE ONLY): 36 min  Charges:  $Therapeutic Exercise: 8-22 mins $Therapeutic Activity: 8-22 mins                     Reeshemah Nazaryan W,PT Acute Rehabilitation Services Pager:  [phone]  Office:  [phone]     Berline Lopes 04/16/2020, 4:59 PM

## 2020-04-16 NOTE — Progress Notes (Signed)
Pt continues to seem very comfortable without trach in place. Pt is resting in bed, not coughing, and has stable vitals. Stoma site is clean and covered with gauze. RT will monitor.

## 2020-04-16 NOTE — Progress Notes (Addendum)
RT NOTE: RT X 2 removed patient from ventilator and removed trach per CCM order. Stoma site cleaned and covered with gauze and tape. Patient tolerated decannulation well but is still persistently coughing. Morphine nebulizer administered per MD. Vitals are stable. RT will continue to monitor.

## 2020-04-16 NOTE — Plan of Care (Signed)

## 2020-04-16 NOTE — Progress Notes (Signed)
ANTICOAGULATION CONSULT NOTE  Pharmacy Consult for bivalirudin Indication: ECMO and VTE  Labs: Recent Labs    04/14/20 0353 04/14/20 0400 04/15/20 0415 04/15/20 0936 04/15/20 1608 04/15/20 2000 04/16/20 0019 04/16/20 0353 04/16/20 0355  HGB 7.9*   < > 8.3*   < > 8.0*   < > 7.8* 7.7* 7.5*  HCT 24.9*   < > 27.7*   < > 25.6*   < > 23.0* 26.0* 22.0*  PLT 277   < > 258  --  259  --   --  239  --   APTT 76*   < > 78*  --  79*  --   --  68*  --   LABPROT 18.0*  --  17.7*  --   --   --   --  18.2*  --   INR 1.6*  --  1.5*  --   --   --   --  1.6*  --   CREATININE 0.75   < > 0.74  --  0.71  --   --  0.79  --    < > = values in this interval not displayed.    Assessment: 60 yoM admitted with COVID-19 PNA with worsening hypoxia, s/p cannulation for ECMO. Pt was started on IV heparin prior to cannulation due to acute DVTs and possible PE, transitioned to bivalirudin with ECMO. Now s/p tPA on 1/5 and tracheostomy on 1/6. Circuit last changed 1/26.  APTT remains therapeutic at 68 seconds and CBC stable.  Goal of Therapy:  aPTT 60-80 seconds   Plan:  -Continue bivalirudin at 0.08 mg/kg/hr (using order-specific wt 72.1kg) -Monitor q12h aPTT/CBC, LDH, and for s/sx of bleeding   Fredonia Highland, PharmD, BCPS, [facility] Clinical Pharmacist [phone] Please check AMION for all [facility] Pharmacy numbers 04/16/2020

## 2020-04-16 NOTE — Progress Notes (Signed)
Patient ID: Alex [name], male   DOB: [birth year], 50 y.o.   MRN: [id number]     Advanced Heart Failure Rounding Note  PCP-Cardiologist: No primary care provider on file.   Subjective:    - 12/30: VV ECMO cannulation - 12/31: Left chest tube replaced - 1/2: Extubated. Echo with EF 60-65%, mildly dilated RV with mildly decreased systolic function.  - 1/4: Agitated, suspected aspiration.  Re-intubated.  - 1/5: ECMO cannula repositioned under TEE guidance. TEE showed moderately dilated/moderate-severely dysfunctional RV in setting of hypoxemia. LUE DVT found.  Patient got 1/2 dose of TPA due to initial concern for large PE.  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  - 1/6: Tracheostomy - 1/7: Echo with mild RV dilation/mild RV dysfunction.  - 1/16: Left chest tube out - 1/17: LUE arterial dopplers repeated, showed no obstruction.  - 1/20: Echo with EF 65-70%, mildly D-shaped septum, mildly dilated and mildly dysfunctional RV.  - 1/22: CTA chest: Bilateral upper lobe PEs (suspect chronic), changes c/w ARDS - 1/23: Bronchoscopy showed semi-occlusive ?mass/polyp in the trachea.  - 1/24: Bronchoscopy showed resolution of 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 - 1/26: ECMO circuit changed.  - 2/1: CT chest showed diffuse bronchiectasis as well as diffuse opacity consistent with COVID-19 PNA with ARDS. - 2/3: Trach exchange  Sweep 5 today, stable CXR.  He had tracheostomy exchange yesterday, cough seems at least somewhat improved.   He is now off abx. WBCs 11 (lower), afebrile.    I/Os positive and weight up.  BUN is also up to 64 with stable creatinine, no evidence for GI bleeding.  He had Lasix 40 mg IV bid + acetazolamide 500 bid yesterday.    ECMO parameters: 3400 rpm Flow 4.47 L/min Pvenous -77 Delta P 30 Sweep 5 FiO2 0.4 VT 380  ABG 7.39/57/135/99% LDH  1,117 => 911 => 751 => 699 => 691 => 534 => 488 => 547 => 461 => 474 => 489 => 553 => 449 => 457 => 421 PTT 68 Lactate  0.7  Objective:   Weight Range: 70 kg Body mass index is 26.49 kg/m.   Vital Signs:   Temp:  [98.2 F (36.8 C)-98.6 F (37 C)] 98.2 F (36.8 C) (02/04 0550) Pulse Rate:  [90-109] 96 (02/04 0700) Resp:  [0-31] 21 (02/04 0700) BP: (90-134)/(55-84) 109/79 (02/04 0700) SpO2:  [96 %-100 %] 100 % (02/04 0700) Arterial Line BP: (99-175)/(52-84) 128/67 (02/04 0700) FiO2 (%):  [40 %] 40 % (02/04 0550) Weight:  [70 kg] 70 kg (02/04 0452) Last BM Date: 04/15/20  Weight change: Filed Weights   04/14/20 0500 04/15/20 0500 04/16/20 0452  Weight: 66.1 kg 69.1 kg 70 kg    Intake/Output:   Intake/Output Summary (Last 24 hours) at 04/16/2020 0755 Last data filed at 04/16/2020 0700 Gross per 24 hour  Intake 3795.31 ml  Output 2975 ml  Net 820.31 ml      Physical Exam    General: NAD, awake Neck: Tracheostomy. No JVD, no thyromegaly or thyroid nodule.  Lungs: Decreased at bases.  CV: Nondisplaced PMI.  Heart regular S1/S2, no S3/S4, no murmur.  No peripheral edema.   Abdomen: Soft, nontender, no hepatosplenomegaly, no distention.  Skin: Intact without lesions or rashes.  Neurologic: Alert, follows commands.  Extremities: Dry gangrene tips of left fingers.  HEENT: Normal.    Telemetry   NSR 100s Personally reviewed   Labs    CBC Recent Labs    04/15/20 1608 04/15/20 2000 04/16/20 5681  04/16/20 0355  WBC 13.7*  --  11.1*  --   HGB 8.0*   < > 7.7* 7.5*  HCT 25.6*   < > 26.0* 22.0*  MCV 97.7  --  99.6  --   PLT 259  --  239  --    < > = values in this interval not displayed.   Basic Metabolic Panel Recent Labs    04/15/20 1608 04/15/20 2000 04/16/20 0353 04/16/20 0355  NA 146*   < > 145 145  K 3.8   < > 3.7 3.8  CL 105  --  105  --   CO2 32  --  30  --   GLUCOSE 120*  --  147*  --   BUN 64*  --  64*  --   CREATININE 0.71  --  0.79  --   CALCIUM 8.9  --  8.8*  --    < > = values in this interval not displayed.   Liver Function Tests No results for  input(s): AST, ALT, ALKPHOS, BILITOT, PROT, ALBUMIN in the last 72 hours. No results for input(s): LIPASE, AMYLASE in the last 72 hours. Cardiac Enzymes No results for input(s): CKTOTAL, CKMB, CKMBINDEX, TROPONINI in the last 72 hours.  BNP: BNP (last 3 results) No results for input(s): BNP in the last 8760 hours.  ProBNP (last 3 results) No results for input(s): PROBNP in the last 8760 hours.   D-Dimer No results for input(s): DDIMER in the last 72 hours. Hemoglobin A1C No results for input(s): HGBA1C in the last 72 hours. Fasting Lipid Panel No results for input(s): CHOL, HDL, LDLCALC, TRIG, CHOLHDL, LDLDIRECT in the last 72 hours. Thyroid Function Tests No results for input(s): TSH, T4TOTAL, T3FREE, THYROIDAB in the last 72 hours.  Invalid input(s): FREET3  Other results:   Imaging    DG Chest 1 View  Result Date: 04/15/2020 CLINICAL DATA:  Tracheostomy, ECMO, COVID-19 EXAM: CHEST  1 VIEW COMPARISON:  Portable exam 0848 hours compared to 04/15/2020 FINDINGS: Tracheostomy tube projects over tracheal air column. Feeding tube extends into stomach. Large-bore ECMO catheter with tip projecting over IVC. RIGHT arm PICC line with tip projecting over SVC. Stable heart size and mediastinal contours. Severe diffuse BILATERAL airspace infiltrates consistent with multifocal pneumonia/ARDS. Probable small RIGHT pleural effusion. No pneumothorax. IMPRESSION: Persistent severe diffuse BILATERAL pulmonary infiltrates consistent with multifocal pneumonia/ COVID-19 and potentially ARDS. Electronically Signed   By: Lavonia Dana M.D.   On: 04/15/2020 08:55     Medications:     Scheduled Medications: . acetaZOLAMIDE  500 mg Oral BID  . carvedilol  25 mg Oral BID WC  . chlorhexidine gluconate (MEDLINE KIT)  15 mL Mouth Rinse BID  . Chlorhexidine Gluconate Cloth  6 each Topical Daily  . chlorpheniramine-HYDROcodone  5 mL Per Tube Q12H  . clonazePAM  1 mg Per Tube Daily  . clonazePAM  3 mg Per  Tube QHS  . cloNIDine  0.1 mg Per Tube Q8H  . dextromethorphan  30 mg Per Tube TID  . diltiazem  30 mg Per Tube Q6H  . docusate  100 mg Per Tube BID  . fiber  1 packet Per Tube BID  . free water  200 mL Per Tube Q4H  . gabapentin  600 mg Per Tube Q8H  . insulin aspart  0-20 Units Subcutaneous Q4H  . insulin aspart  4 Units Subcutaneous Q4H  . insulin detemir  38 Units Subcutaneous BID  . lactobacillus acidophilus  2 tablet Per Tube TID  . liver oil-zinc oxide   Topical 5 X Daily  . mouth rinse  15 mL Mouth Rinse 10 times per day  . melatonin  3 mg Per Tube QHS  . metoCLOPramide (REGLAN) injection  10 mg Intravenous Q8H  . nutrition supplement (JUVEN)  1 packet Per Tube BID BM  . nystatin  5 mL Mouth/Throat QID  . oxyCODONE  15 mg Per Tube Q6H  . pantoprazole sodium  40 mg Per Tube QHS  . potassium chloride  60 mEq Per Tube Once  . QUEtiapine  300 mg Per Tube QHS  . sennosides  10 mL Per Tube QHS  . sodium chloride flush  10-40 mL Intracatheter Q12H    Infusions: . sodium chloride    . sodium chloride 10 mL/hr at 04/16/20 0700  . sodium chloride 250 mL (04/07/20 2203)  . sodium chloride Stopped (03/12/20 0131)  . albumin human 12.5 g (04/11/20 1558)  . bivalirudin (ANGIOMAX) infusion 0.5 mg/mL (Non-ACS indications) 0.08 mg/kg/hr (04/16/20 0700)  . feeding supplement (PIVOT 1.5 CAL) 1,000 mL (04/15/20 1600)  . furosemide (LASIX) 200 mg in dextrose 5% 100 mL ($Remov'2mg'GessSO$ /mL) infusion    . midazolam 2 mg/hr (04/16/20 0700)  . norepinephrine (LEVOPHED) Adult infusion Stopped (04/15/20 2322)    PRN Medications: Place/Maintain arterial line **AND** sodium chloride, sodium chloride, sodium chloride, acetaminophen (TYLENOL) oral liquid 160 mg/5 mL, albumin human, [DISCONTINUED] lidocaine **AND** albuterol, guaiFENesin, hydrALAZINE, labetalol, lip balm, midazolam, midazolam, morphine, morphine CONCENTRATE, ondansetron (ZOFRAN) IV, phenol, polyethylene glycol, simethicone, sodium chloride  flush   Assessment/Plan   1. Acute hypoxemic respiratory failure: Due to COVID-19 PNA with bilateral infiltrates.  Refractory hypoxemia, VV-ECMO cannulation on 02/15/2020 with improvement in oxygenation.  Developed left PTX post-subclavian CVL and had left chest tube, the left lung is re-expanded and CT out.  He was extubated 1/2 but reintubated 1/4 with agitation and suspected aspiration.  Tracheostomy 1/6.  CTA chest 1/22 with suspected chronic PEs and ARDS. ECMO cannula repositioned 1/5. ECMO circuit changed 1/26. LDH stable. Sweep at 4, have struggled with hypercarbia from significant dead space ventilation.  Now off abx with WBCs trending down. PCO2 57. HCO3 30.  I/Os still not negative.  Cough seems better with tracheostomy exchange.  - Push sweep wean today, will allow lower pH.  - Lasix gtt 6 mg/hr with Diamox 500 bid today.   - Patient has had remdesivir, tocilizumab. - Completed steroid taper. - Continue bivalirudin, goal PTT 65-80. He is at 41 today. Discussed dosing with PharmD personally.  - Continue to mobilize  - Coreg increased to 25 mg bid to increase fractional flow via ECMO circuit. Diltiazem 30 q6 added.  2. 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  Clot noted on TEE in RV as well.  TTE 1/2 showed normal EF 60-65%, RV improved (mildly dilated/dysfunctional). TEE on 1/5 with moderate to severe RV dysfunction but patient was hypoxemic.  Had 1/2 dose TPA on 1/5. Echo 1/20 with mildly dilated/mildly dysfunctional RV. CTA chest 1/22 with chronic-appearing PEs in upper lobes. - Bivalirudin for goal PTT 65-80.  He is 70 today. Discussed dosing with PharmD personally. 3. Left PTX: Left chest tube, lung is re-expanded. Tube now out, stable CXR. 4. Shock: Suspect septic/distributive.  Now resolved, off NE.  5. Anemia: Hgb 7.5, transfuse < 8.  Getting 1 unit PRBCs this morning.  6. AKI: Stable creatinine but BUN rising, no  evidence for  GI bleeding.  Following with diuresis.   7. Hyperglycemia: insulin.  8. HTN: BP stable.   - On clonidine.  - Continue Coreg 25 mg bid.   - Diltiazem 30 q6 hrs - Suspect arterial line inaccurate, following cuff for now.  9. CHB: Episode of CHB when hypoxemic and with cough (suspect vagal).  NSR since then.    - Continue Coreg and diltiazem, watch rhythm.  10. Thrombocytopenia: Resolved  11. Ileus: Resolved. TFs restarted.  - Getting Reglan  - Cor-trak repositioned to post-pyloric placement 12. Ischemic digits: LUE.  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  Repeat study 1/17 showed no obstruction.  - Wound Care following. 13. ID: Group F Strep in sputum.  Also with Enterococcus faecalis in sputum. Completed abx. WBCs lower at 11 with PCT 0.53 on 2/2.   - Sent trach aspirate culture.  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   15. Hypernatremia: Continue free water.   CRITICAL CARE Performed by: Loralie Champagne  Total critical care time: 40 minutes  Critical care time was exclusive of separately billable procedures and treating other patients.  Critical care was necessary to treat or prevent imminent or life-threatening deterioration.  Critical care was time spent personally by me on the following activities: development of treatment plan with patient and/or surrogate as well as nursing, discussions with consultants, evaluation of patient's response to treatment, examination of patient, obtaining history from patient or surrogate, ordering and performing treatments and interventions, ordering and review of laboratory studies, ordering and review of radiographic studies, pulse oximetry and re-evaluation of patient's condition.    Length of Stay: [place], MD  04/16/2020, 7:55 AM  Advanced Heart Failure Team Pager [phone] (M-F; 7a - 4p)  Please  contact [facility] for night-coverage after hours (4p -7a ) and weekends on [email or web address]

## 2020-04-16 NOTE — Progress Notes (Signed)
SLP Cancellation Note  Patient Details Name: Alex [name] MRN: [id number] DOB: 1970-09-27   Cancelled treatment:       Reason Eval/Treat Not Completed: Medical issues which prohibited therapy. Team planning to decannulate. Will f/u for needs.    Valentine Barney, Riley Nearing 04/16/2020, 10:39 AM

## 2020-04-16 NOTE — Progress Notes (Signed)
ANTICOAGULATION CONSULT NOTE  Pharmacy Consult for bivalirudin Indication: ECMO and VTE  Labs: Recent Labs    04/14/20 0353 04/14/20 0400 04/15/20 0415 04/15/20 0936 04/15/20 1608 04/15/20 2000 04/16/20 0353 04/16/20 0355 04/16/20 1320 04/16/20 1611 04/16/20 1614  HGB 7.9*   < > 8.3*   < > 8.0*   < > 7.7*   < > 10.2* 10.6* 11.2*  HCT 24.9*   < > 27.7*   < > 25.6*   < > 26.0*   < > 30.0* 34.3* 33.0*  PLT 277   < > 258  --  259  --  239  --   --  306  --   APTT 76*   < > 78*  --  79*  --  68*  --   --  73*  --   LABPROT 18.0*  --  17.7*  --   --   --  18.2*  --   --   --   --   INR 1.6*  --  1.5*  --   --   --  1.6*  --   --   --   --   CREATININE 0.75   < > 0.74  --  0.71  --  0.79  --   --   --   --    < > = values in this interval not displayed.    Assessment: 73 yoM admitted with COVID-19 PNA with worsening hypoxia, s/p cannulation for ECMO. Pt was started on IV heparin prior to cannulation due to acute DVTs and possible PE, transitioned to bivalirudin with ECMO. Now s/p tPA on 1/5 and tracheostomy on 1/6. Circuit last changed 1/26.  APTT remains therapeutic at 73 seconds and CBC stable.  Goal of Therapy:  aPTT 60-80 seconds   Plan:  -Continue bivalirudin at 0.08 mg/kg/hr (using order-specific wt 72.1kg) -Monitor q12h aPTT/CBC, LDH, and for s/sx of bleeding  Elmer Sow, PharmD, BCPS, BCCCP Clinical Pharmacist [phone]  Please check AMION for all [facility] numbers  04/16/2020 5:01 PM

## 2020-04-16 NOTE — Progress Notes (Addendum)
ECMO PROGRESS NOTE  NAME:  Aadin Gaut, MRN:  [id number], DOB:  [birth year], LOS: 38 ADMISSION DATE:  Mar 26, 2020, CONSULTATION DATE: 02/21/2020 REFERRING MD: Wynona Neat -LBPCCM, CHIEF COMPLAINT: Respiratory failure requiring ECMO  HPI/course in hospital  50 year old man admitted to hospital 12/28 with 1 week history of dyspnea cough nausea and vomiting.  Initially admitted to [facility] long hospital and placed on high flow nasal cannula but rapidly failed and required intubation 12/29.  Persistent hypoxic respiratory failure with PF ratio 55 in spite of 18 of PEEP FiO2 0.1 despite paralytics.  Did not improve with prone ventilation  Cannulated for VV ECMO 12/30 via right IJ crescent cannula.  ECMO circuit was changed on 04/07/2020 Iatrogenic pneumothorax from left subclavian triple-lumen placement  Past Medical History  none  Interim history/subjective:  Still positive fluid balance Slept better  Objective   Blood pressure 131/88, pulse (!) 102, temperature 98.2 F (36.8 C), temperature source Core, resp. rate (!) 21, height 5\' 4"  (1.626 m), weight 70 kg, SpO2 100 %.    Vent Mode: SIMV/PC/PS FiO2 (%):  [40 %] 40 % Set Rate:  [24 bmp] 24 bmp PEEP:  [10 cmH20] 10 cmH20 Pressure Support:  [16 cmH20] 16 cmH20 Plateau Pressure:  [26 cmH20-28 cmH20] 26 cmH20   Intake/Output Summary (Last 24 hours) at 04/16/2020 06/14/2020 Last data filed at 04/16/2020 0800 Gross per 24 hour  Intake 3419.76 ml  Output 2890 ml  Net 529.76 ml   Filed Weights   04/14/20 0500 04/15/20 0500 04/16/20 0452  Weight: 66.1 kg 69.1 kg 70 kg    Examination:   Constitutional: ill appearing man on ECMO  Eyes: EOMI, pupils equal Ears, nose, mouth, and throat: MMM, trach in place Cardiovascular: More regular (less tachycardic) today, ext warm Respiratory: diminished bases, less tachypneic Gastrointestinal: soft, hypoactive BS Skin: stable ischemic changes of left hand Neurologic: moves all 4 ext to  command Psychiatric: RASS 0   Net +0.8L K a bit low ABG looks good Cr slightly up CBG good WBC improving H/H slightly low, transfusing Plts stable LDH good Afebrile CXR bilateral infiltrates stable CT chest 2/1: 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, no frank fibrosis, ongoing airspace disease, small R effusion  Assessment & Plan:  Acute hypoxemic/hypercapnic respiratory failure due to severe ARDS from COVID-19 pneumonia Probable acute PE, and RV dysfunction s/p TPA Refractory coughing Strep group F/enterococcal pneumonia- s/p 10 days vanc 1/29 - Continue VV ECMO support, sweep weans as able - Current cough regimen:  dextromethorphan, gabapentin - Continue lung protective ventilation - Today, starting lasix gtt, diamox 500 bid, goal is -500-1 L by end of day; going to really push sweep wean, if we determine he is being limited by cough (ie more coughing with sweep wean worsening ventilation) I think we should try trach decannulation  HTN - Trials of reducing HR/BP have resulted in reduced pCO2 (likely related to fractional flow through ECMO circuit) - continue increased coreg, low dose diltiazem  Acute delirium, with agitation -  RASS goal -1 to 0, currently on klonipin 1mg  qAM 3mg  qHS, low dose versed gtt, PRN, morphine PRN, oxycodone 15mg  q6h, seroquel 0-300mg  qHS PRN - Start weaning versed gtt after vent wean if able  Gastroparesis with some element ileus: improved - Continue reglan, strengthened bowel regimen (miralax, docusate, fiberpack), QTc okay 1/31   Ischemic changes L hand - wound following, appreciate dressing recommendations - surgical consult for L hand once more stable  Poorly controlled diabetes type II -  Continue levemir, SSI; looks good  Acute urinary retention -Foley, need to think about trial off this at some point -Bethanechol  Hypernatremia, fluid balance- lasix gtt + diamox   Daily Goals Checklist  Pain/Anxiety/Delirium protocol (if  indicated): see above VAP protocol (if indicated): bundle in place.  DVT prophylaxis: bivalirudin GI prophylaxis: pantoprazole Glucose control: Euglcyemic on combination of SSI and Basal insulin. Mobility/therapy needs: Mobilization as tolerated, appreciate PT help Code Status: Full code Disposition: ICU.    Patient critically ill due to COVID ARDS Interventions to address this today vent wean, ECMO wean Risk of deterioration without these interventions is high  I personally spent 33 minutes providing critical care not including any separately billable procedures  Myrla Halsted MD [facility] 04/16/2020 8:07 AM Prefer epic messenger for cross cover needs If after hours, please call E-link

## 2020-04-16 NOTE — Progress Notes (Signed)
Notified Smith MD of critical pCO2 on iSTAT ABG. Orders received to recheck in 2 hrs.

## 2020-04-16 NOTE — Progress Notes (Signed)
27mL versed wasted and witnessed with Royston Bake, RN in stericycle container d/t expiring IV tubing.

## 2020-04-16 NOTE — Progress Notes (Signed)
Event log: 12/28 admitted covid, ARDS 12/30 ECMO cannulation, iatrogenic pneumothrorax, DVT, Bivalrudin started           Left subclavian hematoma, chest tube, ceftriaxone/ azithromycin started 12/31 1Unit PRBC 1/1 Palliative consult, noted HTN, fentanyl only 1/2 Extubation I/O positive, left lung re-expanded, EF 60-65%, Lasix 20mg  BID 1/3 BiPAP in place, HTN to 190s, 200s, labetolol for BP, I/O even, cefepime and vancomycin started 1/4 agitation off BiPAP, possible aspiration 1/5 agitation overnight, reintubated      ECHO with RV dilation, ECMO cannula repositioned, LUE DVT (+), 1/2 dose tPA given,      I/O up, on lasix, Epi, NE, vasopressin started, HR/ rhythm issues noted 1/6 Tracheostomy, hypoglycemic when off tube feeds       Cortrack tune issues 1/7 on lasix gtt 4mg /hr with diuresis, agitation improved 1/8 starting steroid taper 1/9 severe agitation, heavy sedation required, lasix gtt 6mg /hr, I/Os (-)       precedex and fentanyl 1/10 Vanc stopped 1/11 sweep to 2, lasix gtt at 4mg /hr, weight up, metoprolol added for HTN 1/12 fevers to 99 noted, lasix gtt and metazolone 1/13 lasix gtt dced 1/14 intermittent HTN, possibly flash pulmonary edema tied to sedation        Ischemic left hand changes, 1 Unit PRBCs 1/15 1/16 sweep from 4 to 2.5, chest tube removed 1/17 sweep at 6, trial of nebulized morphine for cough, LUE dopplers show no obstruction 1/18 two events of 2nd degree type II HB noted with coughing         sweep at 4, IV lasix 40, acetazolamide, distal XLT trach placement         agitation and BP spikes 1/19 agitation, air hunger, seroquel, klonopin, oxy, valproate, ketamine trial 1/20 sweep at 3.5, diuresis results in chugging, albumin given         1 Unit PRBCs, lidocaine nebulizers for cough, ancef started 1/21 sweep to 8, anxiety, I/Os (+) with lasix and acetazolamide 1/22 sweep at 5, chest CT bilateral upper lobe PEs 1/23 sweep at 5, delirium, I/Os even with lasix and  acetazolamide         continued coughing, 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 1/24 sweep at 7.5, 1 Unit PRBCs, repeat bronchoscopy showed resolution of 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 1/25 sweep at 7.5, on propofol, lasix gtt at 79ml/hr, I/Os (+)         nebulized morphine and lidocaine for cough 1/26 sweep at 8, propofol/ precedex, cough present when weaned, lasix gtt at 35ml/hr        ECMO circuit changes, levophed started, 1 Unit PRBCs 1/27 sweep at 4, off sedation, delirium, I/Os even        lasix gtt restated, acetazolamide overnight 1/28 sweep at 7.5, HTN labile 1/29 sweep of 6, patient reports a tickle in throat, cetacaine 1/30 sweep down to 6, cough improved with cetacaine and gabapentin 1/31 sweep at 5, agitation, off acetazolamide, lasix, weight up 2/1 cough, of pulmicort, cetacaine for cough, continued gabapentin, dexmethorphan       CT demonstrated trach placement against back of trachea 2/2 Trach collar trial somewhat effective in managing cough       low dose diltiazem for HR,  2/3 sweep at 4,trach cannula replacement (longer, more flexible bivona); brochoscopy shows irritated mucosa       IV lasix and acetazolamide 2/4 sweep at 5, I/Os (+), BUN elevated, lasix gtt with diamox, pushing sweep      trach decannulation, increased gabapentin for cough

## 2020-04-16 NOTE — Progress Notes (Signed)
Occupational Therapy Treatment Patient Details Name: Alex [name] MRN: [id number] DOB: 1971-01-11 Today's Date: 04/16/2020    History of present illness Pt is 50 y.o. male  with no significant past medical history admitted on 03/02/2020 with dyspnea, cough, nausea/vomiting ~ 1 week ago with worsening symptoms of body aches and fatigue and +COVID 02/07/20 and admitted with shortness of breath. Required intubation 03/10/20. Cannulated for VV ECMO April 06, 2020. Oxygenating better with ECMO. Also evidence of RLE DVT, RV thrombus, and high suspicion of PE. Has required chest tube to L lung for collapse which needs further reposition with recurrent collapse. Extubated 03-15-19.  Reintubated 1/5 and trach placed 1/6. Decannulated 03/16/20.   OT comments  Pt decanulated earlier. Inconsistently following 1 step commands. RR in 40s. Pt seen for BUE strengthening in seated position addition to further assessing L hand. Bulky dressing removed. Tubular guaze placed over each digit to secure xeroform and allow for increased digit ROM. Attempted to modify prefab splint to increase MP flexion and IP extension in intrinsic plus position - difficult to achieve good fit due to size of pt's hand. Recommend nsg encourage frequent digit ROM during awake hours. Only wear splint at night (will further assess and may need to fabricate custom splint). Keep B hands elevated to reduce dependent edema. Ortho tech brought tubular guaze to room to use for dressing changes.  Follow Up Recommendations  CIR;Supervision/Assistance - 24 hour (will further assess)    Equipment Recommendations  Other (comment)    Recommendations for Other Services      Precautions / Restrictions Precautions Precautions: Fall Precaution Comments: ECMO, NGtube, Foley, trach Required Braces or Orthoses: Splint/Cast Splint/Cast: Left resting hand splint to be worn for 4 hours at night (8pm-12am) with RN to check thumb at end of wearing time for any pressure  issues and contact OT department if so (numbers left in orders section of chart)       Mobility Bed Mobility                  Transfers                      Balance                                           ADL either performed or assessed with clinical judgement   ADL                                         General ADL Comments: total A for all basic ADls     Vision       Perception     Praxis      Cognition Arousal/Alertness: Awake/alert Behavior During Therapy: Flat affect;Restless Overall Cognitive Status: Difficult to assess                                 General Comments: Appears fatigues; inconsistently following 1 step commands        Exercises Exercises: Other exercises General Exercises - Upper Extremity Shoulder Flexion: Strengthening;Both;10 reps;Seated (to 90 FF) Elbow Flexion: Both;10 reps;Seated;AROM;AAROM;Strengthening Elbow Extension: AROM;AAROM;Strengthening;Both;10 reps;Seated Wrist Flexion: AROM;AAROM;Both;10 reps;Seated Digit Composite Flexion: AROM;AAROM;Both;10 reps;Seated Other Exercises Other Exercises: continuous passive stretch into  composite flexion; able to achieve @ 1 inch from palm - L   Shoulder Instructions       General Comments ROM and dressings assess; tubular guaze applied to fingers with increase ROM allowed; attmepted to reposition slint. Difficult to achieve good fit due to small hand, short digits and amount of edema. will most likley need custom splint    Pertinent Vitals/ Pain       Pain Assessment: Faces Faces Pain Scale: Hurts little more Pain Location: generalized Pain Descriptors / Indicators: Discomfort Pain Intervention(s): Limited activity within patient's tolerance  Home Living                                          Prior Functioning/Environment              Frequency  Min 2X/week        Progress Toward  Goals  OT Goals(current goals can now be found in the care plan section)  Progress towards OT goals: Progressing toward goals  Acute Rehab OT Goals Patient Stated Goal: unable to state OT Goal Formulation: Patient unable to participate in goal setting Time For Goal Achievement: 04/16/20 Potential to Achieve Goals: Fair ADL Goals Pt Will Perform Grooming: with min assist Pt Will Perform Upper Body Bathing: with mod assist;sitting Pt Will Perform Upper Body Dressing: with mod assist;sitting Pt/caregiver will Perform Home Exercise Program: Increased ROM;Increased strength;Left upper extremity;With written HEP provided Additional ADL Goal #1: Patient will be Mod A times one for supine to sit for increased independence with toilet transfers. Additional ADL Goal #2: Pt will tolerate left resting hand splint for 4 hours at night only  Plan Discharge plan remains appropriate    Co-evaluation                 AM-PAC OT "6 Clicks" Daily Activity     Outcome Measure   Help from another person eating meals?: Total Help from another person taking care of personal grooming?: Total Help from another person toileting, which includes using toliet, bedpan, or urinal?: Total Help from another person bathing (including washing, rinsing, drying)?: Total Help from another person to put on and taking off regular upper body clothing?: Total Help from another person to put on and taking off regular lower body clothing?: Total 6 Click Score: 6    End of Session Equipment Utilized During Treatment: Oxygen  OT Visit Diagnosis: Muscle weakness (generalized) (M62.81);Other symptoms and signs involving cognitive function;Other abnormalities of gait and mobility (R26.89)   Activity Tolerance Patient limited by lethargy;Patient limited by fatigue   Patient Left in bed;with call bell/phone within reach;with nursing/sitter in room   Nurse Communication Other (comment) (encourage movemetn of L hand; keep  BUE elevated; have pt squeeze washcloths with B hands; wear splint at night only; adapt dressing change using tubular guqze)        Time: 3545-6256 OT Time Calculation (min): 24 min  Charges: OT General Charges $OT Visit: 1 Visit OT Treatments $Therapeutic Activity: 8-22 mins $Therapeutic Exercise: 8-22 mins  Luisa Dago, OT/L   Acute OT Clinical Specialist Acute Rehabilitation Services Pager [phone] Office [phone]    [facility] 04/16/2020, 1:13 PM

## 2020-04-16 NOTE — Progress Notes (Signed)
RT NOTE: Patient's stoma site clean and covered with gauze. Patient is resting comfortably, coughing seems to have subsided. Vitals are stable. RT will continue to monitor.

## 2020-04-16 NOTE — Procedures (Signed)
Extracorporeal support note  ECLS cannulation date: 03/25/20 Last circuit change: 04/07/2020  Indication: Acute hypoxic respiratory failure due to ARDS from COVID-19 pneumonia with RV dysfunction.   Configuration: Venovenous  Drainage cannula: 32 French crescent cannula via right IJ Return cannula: Same  Pump speed: 3400 RPM Pump flow: 4.6 L/min Pump used: Cardio help  Oxygenator: Cardio help O2 blender: 100% Sweep gas: 5L  Circuit check: No clots, same Anticoagulant: Bivalirudin Anticoagulation targets: PTT 60-80  Changes in support:  In a somewhat more stable position with coughing, going to really wean sweep and see if it is the coughing that is limiting sweep weans; if it is the cough I think we will try taking trach out completely  Anticipated goals/duration of support: Bridge to recovery.  Multidisciplinary ECMO rounds completed.  Myrla Halsted MD PCCM 04/16/2020

## 2020-04-16 NOTE — Progress Notes (Signed)
Smith MD notified of all critical pCO2 values throughout shift. See orders.

## 2020-04-17 ENCOUNTER — Inpatient Hospital Stay (HOSPITAL_COMMUNITY): Payer: Medicaid Other

## 2020-04-17 LAB — TYPE AND SCREEN
ABO/RH(D): B POS
Antibody Screen: NEGATIVE
Unit division: 0
Unit division: 0
Unit division: 0
Unit division: 0
Unit division: 0
Unit division: 0

## 2020-04-17 LAB — POCT I-STAT 7, (LYTES, BLD GAS, ICA,H+H)
Acid-Base Excess: 5 mmol/L — ABNORMAL HIGH (ref 0.0–2.0)
Acid-Base Excess: 6 mmol/L — ABNORMAL HIGH (ref 0.0–2.0)
Acid-Base Excess: 6 mmol/L — ABNORMAL HIGH (ref 0.0–2.0)
Acid-Base Excess: 6 mmol/L — ABNORMAL HIGH (ref 0.0–2.0)
Acid-Base Excess: 9 mmol/L — ABNORMAL HIGH (ref 0.0–2.0)
Acid-Base Excess: 9 mmol/L — ABNORMAL HIGH (ref 0.0–2.0)
Bicarbonate: 32.2 mmol/L — ABNORMAL HIGH (ref 20.0–28.0)
Bicarbonate: 32.2 mmol/L — ABNORMAL HIGH (ref 20.0–28.0)
Bicarbonate: 32.7 mmol/L — ABNORMAL HIGH (ref 20.0–28.0)
Bicarbonate: 33.4 mmol/L — ABNORMAL HIGH (ref 20.0–28.0)
Bicarbonate: 35 mmol/L — ABNORMAL HIGH (ref 20.0–28.0)
Bicarbonate: 35.8 mmol/L — ABNORMAL HIGH (ref 20.0–28.0)
Calcium, Ion: 1.17 mmol/L (ref 1.15–1.40)
Calcium, Ion: 1.18 mmol/L (ref 1.15–1.40)
Calcium, Ion: 1.18 mmol/L (ref 1.15–1.40)
Calcium, Ion: 1.19 mmol/L (ref 1.15–1.40)
Calcium, Ion: 1.21 mmol/L (ref 1.15–1.40)
Calcium, Ion: 1.24 mmol/L (ref 1.15–1.40)
HCT: 26 % — ABNORMAL LOW (ref 39.0–52.0)
HCT: 26 % — ABNORMAL LOW (ref 39.0–52.0)
HCT: 27 % — ABNORMAL LOW (ref 39.0–52.0)
HCT: 27 % — ABNORMAL LOW (ref 39.0–52.0)
HCT: 29 % — ABNORMAL LOW (ref 39.0–52.0)
HCT: 29 % — ABNORMAL LOW (ref 39.0–52.0)
Hemoglobin: 8.8 g/dL — ABNORMAL LOW (ref 13.0–17.0)
Hemoglobin: 8.8 g/dL — ABNORMAL LOW (ref 13.0–17.0)
Hemoglobin: 9.2 g/dL — ABNORMAL LOW (ref 13.0–17.0)
Hemoglobin: 9.2 g/dL — ABNORMAL LOW (ref 13.0–17.0)
Hemoglobin: 9.9 g/dL — ABNORMAL LOW (ref 13.0–17.0)
Hemoglobin: 9.9 g/dL — ABNORMAL LOW (ref 13.0–17.0)
O2 Saturation: 90 %
O2 Saturation: 92 %
O2 Saturation: 97 %
O2 Saturation: 97 %
O2 Saturation: 98 %
O2 Saturation: 99 %
Patient temperature: 36.6
Patient temperature: 36.9
Patient temperature: 36.9
Patient temperature: 98.3
Patient temperature: 99.4
Patient temperature: 99.5
Potassium: 2.9 mmol/L — ABNORMAL LOW (ref 3.5–5.1)
Potassium: 3.2 mmol/L — ABNORMAL LOW (ref 3.5–5.1)
Potassium: 3.7 mmol/L (ref 3.5–5.1)
Potassium: 3.8 mmol/L (ref 3.5–5.1)
Potassium: 3.9 mmol/L (ref 3.5–5.1)
Potassium: 4.1 mmol/L (ref 3.5–5.1)
Sodium: 143 mmol/L (ref 135–145)
Sodium: 143 mmol/L (ref 135–145)
Sodium: 143 mmol/L (ref 135–145)
Sodium: 144 mmol/L (ref 135–145)
Sodium: 144 mmol/L (ref 135–145)
Sodium: 146 mmol/L — ABNORMAL HIGH (ref 135–145)
TCO2: 34 mmol/L — ABNORMAL HIGH (ref 22–32)
TCO2: 34 mmol/L — ABNORMAL HIGH (ref 22–32)
TCO2: 34 mmol/L — ABNORMAL HIGH (ref 22–32)
TCO2: 35 mmol/L — ABNORMAL HIGH (ref 22–32)
TCO2: 37 mmol/L — ABNORMAL HIGH (ref 22–32)
TCO2: 38 mmol/L — ABNORMAL HIGH (ref 22–32)
pCO2 arterial: 58.1 mmHg — ABNORMAL HIGH (ref 32.0–48.0)
pCO2 arterial: 58.2 mmHg — ABNORMAL HIGH (ref 32.0–48.0)
pCO2 arterial: 59.5 mmHg — ABNORMAL HIGH (ref 32.0–48.0)
pCO2 arterial: 60.6 mmHg — ABNORMAL HIGH (ref 32.0–48.0)
pCO2 arterial: 61.1 mmHg — ABNORMAL HIGH (ref 32.0–48.0)
pCO2 arterial: 63.3 mmHg — ABNORMAL HIGH (ref 32.0–48.0)
pH, Arterial: 7.329 — ABNORMAL LOW (ref 7.350–7.450)
pH, Arterial: 7.333 — ABNORMAL LOW (ref 7.350–7.450)
pH, Arterial: 7.352 (ref 7.350–7.450)
pH, Arterial: 7.356 (ref 7.350–7.450)
pH, Arterial: 7.378 (ref 7.350–7.450)
pH, Arterial: 7.38 (ref 7.350–7.450)
pO2, Arterial: 101 mmHg (ref 83.0–108.0)
pO2, Arterial: 125 mmHg — ABNORMAL HIGH (ref 83.0–108.0)
pO2, Arterial: 136 mmHg — ABNORMAL HIGH (ref 83.0–108.0)
pO2, Arterial: 63 mmHg — ABNORMAL LOW (ref 83.0–108.0)
pO2, Arterial: 69 mmHg — ABNORMAL LOW (ref 83.0–108.0)
pO2, Arterial: 98 mmHg (ref 83.0–108.0)

## 2020-04-17 LAB — PROCALCITONIN: Procalcitonin: 3.07 ng/mL

## 2020-04-17 LAB — BPAM RBC
Blood Product Expiration Date: 202202172359
Blood Product Expiration Date: 202202192359
Blood Product Expiration Date: 202202202359
Blood Product Expiration Date: 202202202359
Blood Product Expiration Date: 202202212359
Blood Product Expiration Date: 202202212359
ISSUE DATE / TIME: 202202020619
ISSUE DATE / TIME: 202202040522
Unit Type and Rh: 7300
Unit Type and Rh: 7300
Unit Type and Rh: 7300
Unit Type and Rh: 7300
Unit Type and Rh: 7300
Unit Type and Rh: 7300

## 2020-04-17 LAB — URINALYSIS, ROUTINE W REFLEX MICROSCOPIC
Bilirubin Urine: NEGATIVE
Glucose, UA: NEGATIVE mg/dL
Ketones, ur: NEGATIVE mg/dL
Nitrite: NEGATIVE
Protein, ur: NEGATIVE mg/dL
Specific Gravity, Urine: 1.02 (ref 1.005–1.030)
pH: 5 (ref 5.0–8.0)

## 2020-04-17 LAB — CBC
HCT: 33 % — ABNORMAL LOW (ref 39.0–52.0)
HCT: 28.1 % — ABNORMAL LOW (ref 39.0–52.0)
Hemoglobin: 9.9 g/dL — ABNORMAL LOW (ref 13.0–17.0)
Hemoglobin: 8.4 g/dL — ABNORMAL LOW (ref 13.0–17.0)
MCH: 28.8 pg (ref 26.0–34.0)
MCH: 29.3 pg (ref 26.0–34.0)
MCHC: 30 g/dL (ref 30.0–36.0)
MCHC: 29.9 g/dL — ABNORMAL LOW (ref 30.0–36.0)
MCV: 95.9 fL (ref 80.0–100.0)
MCV: 97.9 fL (ref 80.0–100.0)
Platelets: 262 10*3/uL (ref 150–400)
Platelets: 215 10*3/uL (ref 150–400)
RBC: 3.44 MIL/uL — ABNORMAL LOW (ref 4.22–5.81)
RBC: 2.87 MIL/uL — ABNORMAL LOW (ref 4.22–5.81)
RDW: 17.9 % — ABNORMAL HIGH (ref 11.5–15.5)
RDW: 17.6 % — ABNORMAL HIGH (ref 11.5–15.5)
WBC: 20.9 10*3/uL — ABNORMAL HIGH (ref 4.0–10.5)
WBC: 25 10*3/uL — ABNORMAL HIGH (ref 4.0–10.5)
nRBC: 0.2 % (ref 0.0–0.2)
nRBC: 0.1 % (ref 0.0–0.2)

## 2020-04-17 LAB — LACTIC ACID, PLASMA
Lactic Acid, Venous: 1.1 mmol/L (ref 0.5–1.9)
Lactic Acid, Venous: 1.1 mmol/L (ref 0.5–1.9)

## 2020-04-17 LAB — BLOOD CULTURE ID PANEL (REFLEXED) - BCID2

## 2020-04-17 LAB — BASIC METABOLIC PANEL
Anion gap: 12 (ref 5–15)
Anion gap: 11 (ref 5–15)
BUN: 56 mg/dL — ABNORMAL HIGH (ref 6–20)
BUN: 85 mg/dL — ABNORMAL HIGH (ref 6–20)
CO2: 33 mmol/L — ABNORMAL HIGH (ref 22–32)
CO2: 30 mmol/L (ref 22–32)
Calcium: 9 mg/dL (ref 8.9–10.3)
Calcium: 8.3 mg/dL — ABNORMAL LOW (ref 8.9–10.3)
Chloride: 100 mmol/L (ref 98–111)
Chloride: 100 mmol/L (ref 98–111)
Creatinine, Ser: 0.73 mg/dL (ref 0.61–1.24)
Creatinine, Ser: 1.1 mg/dL (ref 0.61–1.24)
GFR, Estimated: >60 mL/min (ref >60)
GFR, Estimated: >60 mL/min (ref >60)
Glucose, Bld: 119 mg/dL — ABNORMAL HIGH (ref 70–99)
Glucose, Bld: 330 mg/dL — ABNORMAL HIGH (ref 70–99)
Potassium: 2.9 mmol/L — ABNORMAL LOW (ref 3.5–5.1)
Potassium: 3.9 mmol/L (ref 3.5–5.1)
Sodium: 145 mmol/L (ref 135–145)
Sodium: 141 mmol/L (ref 135–145)

## 2020-04-17 LAB — GLUCOSE, CAPILLARY
Glucose-Capillary: 108 mg/dL — ABNORMAL HIGH (ref 70–99)
Glucose-Capillary: 122 mg/dL — ABNORMAL HIGH (ref 70–99)
Glucose-Capillary: 124 mg/dL — ABNORMAL HIGH (ref 70–99)
Glucose-Capillary: 269 mg/dL — ABNORMAL HIGH (ref 70–99)
Glucose-Capillary: 278 mg/dL — ABNORMAL HIGH (ref 70–99)
Glucose-Capillary: 289 mg/dL — ABNORMAL HIGH (ref 70–99)
Glucose-Capillary: 293 mg/dL — ABNORMAL HIGH (ref 70–99)
Glucose-Capillary: 69 mg/dL — ABNORMAL LOW (ref 70–99)

## 2020-04-17 LAB — PROTIME-INR
INR: 1.4 — ABNORMAL HIGH (ref 0.8–1.2)
Prothrombin Time: 16.9 seconds — ABNORMAL HIGH (ref 11.4–15.2)

## 2020-04-17 LAB — APTT
aPTT: 67 seconds — ABNORMAL HIGH (ref 24–36)
aPTT: 89 seconds — ABNORMAL HIGH (ref 24–36)

## 2020-04-17 LAB — FIBRINOGEN: Fibrinogen: 650 mg/dL — ABNORMAL HIGH (ref 210–475)

## 2020-04-17 LAB — LACTATE DEHYDROGENASE: LDH: 539 U/L — ABNORMAL HIGH (ref 98–192)

## 2020-04-17 MED ORDER — ACETAZOLAMIDE 250 MG PO TABS
250.0000 mg | ORAL_TABLET | Freq: Two times a day (BID) | ORAL | Status: DC
  Filled 2020-04-17: qty 1

## 2020-04-17 MED ORDER — IPRATROPIUM-ALBUTEROL 0.5-2.5 (3) MG/3ML IN SOLN
3.0000 mL | Freq: Four times a day (QID) | RESPIRATORY_TRACT | Status: DC
  Administered 2020-04-17 – 2020-04-22 (×20): 3 mL via RESPIRATORY_TRACT
  Filled 2020-04-17 (×21): qty 3

## 2020-04-17 MED ORDER — VANCOMYCIN HCL 1250 MG/250ML IV SOLN
1250.0000 mg | Freq: Once | INTRAVENOUS | Status: AC
  Administered 2020-04-17: 1250 mg via INTRAVENOUS
  Filled 2020-04-17: qty 250

## 2020-04-17 MED ORDER — ACETAZOLAMIDE 250 MG PO TABS
500.0000 mg | ORAL_TABLET | Freq: Two times a day (BID) | ORAL | Status: DC
  Administered 2020-04-17 – 2020-04-18 (×4): 500 mg
  Filled 2020-04-17 (×5): qty 2

## 2020-04-17 MED ORDER — SODIUM CHLORIDE 0.9 % IV SOLN
2.0000 g | Freq: Three times a day (TID) | INTRAVENOUS | Status: DC
  Administered 2020-04-17 (×3): 2 g via INTRAVENOUS
  Filled 2020-04-17 (×3): qty 2

## 2020-04-17 MED ORDER — POTASSIUM CHLORIDE 20 MEQ PO PACK
40.0000 meq | PACK | ORAL | Status: AC
  Administered 2020-04-17 (×2): 40 meq
  Filled 2020-04-17 (×2): qty 2

## 2020-04-17 MED ORDER — VANCOMYCIN HCL IN DEXTROSE 1-5 GM/200ML-% IV SOLN
1000.0000 mg | Freq: Two times a day (BID) | INTRAVENOUS | Status: DC
  Administered 2020-04-17 – 2020-04-19 (×4): 1000 mg via INTRAVENOUS
  Filled 2020-04-17 (×4): qty 200

## 2020-04-17 MED ORDER — DILTIAZEM HCL 60 MG PO TABS
60.0000 mg | ORAL_TABLET | Freq: Four times a day (QID) | ORAL | Status: DC
  Filled 2020-04-17 (×2): qty 1

## 2020-04-17 MED ORDER — GERHARDT'S BUTT CREAM
1.0000 "application " | TOPICAL_CREAM | CUTANEOUS | Status: DC | PRN
  Administered 2020-04-18 – 2020-04-25 (×3): 1 via TOPICAL
  Filled 2020-04-17 (×2): qty 1

## 2020-04-17 MED ORDER — DEXTROSE 50 % IV SOLN
INTRAVENOUS | Status: AC
  Administered 2020-04-17: 12.5 g via INTRAVENOUS
  Filled 2020-04-17: qty 50

## 2020-04-17 MED ORDER — DEXTROSE 50 % IV SOLN: 12.5000 g | INTRAVENOUS | Status: AC

## 2020-04-17 MED ORDER — QUETIAPINE FUMARATE 100 MG PO TABS
100.0000 mg | ORAL_TABLET | Freq: Every day | ORAL | Status: DC
  Administered 2020-04-17 – 2020-04-22 (×6): 100 mg
  Filled 2020-04-17 (×2): qty 1
  Filled 2020-04-17: qty 3
  Filled 2020-04-17 (×3): qty 1

## 2020-04-17 NOTE — Progress Notes (Signed)
PHARMACY - PHYSICIAN COMMUNICATION CRITICAL VALUE ALERT - BLOOD CULTURE IDENTIFICATION (BCID)  Alex [name] is an 51 y.o. male who presented to [facility] on 03/03/2020 with a chief complaint of cough and n/v. Found to have covid.   Assessment: Patient well known to pharmacy service due to very intense care while on ECMO support in ICU. Tmax 99.5 this morning, wbc up to 25. Patient started on HCAP coverage this am with vancomycin and cefepime. Now with positive blood cultures for Enterococcus faecalis.   Name of physician (or Provider) Contacted: Katrinka Blazing  Current antibiotics: Vancomycin and Cefepime  Changes to prescribed antibiotics recommended:  Patient is on recommended antibiotics - No changes needed  Will continue current antibiotics in case its polymicrobial, vancomycin will cover the enterococcus. MD to discuss with ID and the rest of the team tomorrow.   Results for orders placed or performed during the hospital encounter of 02/15/2020  Blood Culture ID Panel (Reflexed) (Collected: 04/17/2020  8:11 AM)  Result Value Ref Range   Enterococcus faecalis DETECTED (A) NOT DETECTED   Enterococcus Faecium NOT DETECTED NOT DETECTED   Listeria monocytogenes NOT DETECTED NOT DETECTED   Staphylococcus species NOT DETECTED NOT DETECTED   Staphylococcus aureus (BCID) NOT DETECTED NOT DETECTED   Staphylococcus epidermidis NOT DETECTED NOT DETECTED   Staphylococcus lugdunensis NOT DETECTED NOT DETECTED   Streptococcus species NOT DETECTED NOT DETECTED   Streptococcus agalactiae NOT DETECTED NOT DETECTED   Streptococcus pneumoniae NOT DETECTED NOT DETECTED   Streptococcus pyogenes NOT DETECTED NOT DETECTED   A.calcoaceticus-baumannii NOT DETECTED NOT DETECTED   Bacteroides fragilis NOT DETECTED NOT DETECTED   Enterobacterales NOT DETECTED NOT DETECTED   Enterobacter cloacae complex NOT DETECTED NOT DETECTED   Escherichia coli NOT DETECTED NOT DETECTED   Klebsiella aerogenes NOT DETECTED NOT  DETECTED   Klebsiella oxytoca NOT DETECTED NOT DETECTED   Klebsiella pneumoniae NOT DETECTED NOT DETECTED   Proteus species NOT DETECTED NOT DETECTED   Salmonella species NOT DETECTED NOT DETECTED   Serratia marcescens NOT DETECTED NOT DETECTED   Haemophilus influenzae NOT DETECTED NOT DETECTED   Neisseria meningitidis NOT DETECTED NOT DETECTED   Pseudomonas aeruginosa NOT DETECTED NOT DETECTED   Stenotrophomonas maltophilia NOT DETECTED NOT DETECTED   Candida albicans NOT DETECTED NOT DETECTED   Candida auris NOT DETECTED NOT DETECTED   Candida glabrata NOT DETECTED NOT DETECTED   Candida krusei NOT DETECTED NOT DETECTED   Candida parapsilosis NOT DETECTED NOT DETECTED   Candida tropicalis NOT DETECTED NOT DETECTED   Cryptococcus neoformans/gattii NOT DETECTED NOT DETECTED   Vancomycin resistance NOT DETECTED NOT DETECTED    Sheppard Coil PharmD., BCPS Clinical Pharmacist 04/17/2020 9:52 PM

## 2020-04-17 NOTE — Progress Notes (Signed)
ECMO PROGRESS NOTE  NAME:  Alex [name], MRN:  [id number], DOB:  Jun 10, 1970, LOS: 39 ADMISSION DATE:  03/10/2020, CONSULTATION DATE: 02/16/2020 REFERRING MD: Wynona Neat -LBPCCM, CHIEF COMPLAINT: Respiratory failure requiring ECMO  HPI/course in hospital  50 year old man admitted to hospital 12/28 with 1 week history of dyspnea cough nausea and vomiting.  Initially admitted to [facility] long hospital and placed on high flow nasal cannula but rapidly failed and required intubation 12/29.  Persistent hypoxic respiratory failure with PF ratio 55 in spite of 18 of PEEP FiO2 0.1 despite paralytics.  Did not improve with prone ventilation  Cannulated for VV ECMO 12/30 via right IJ crescent cannula.  ECMO circuit was changed on 04/07/2020 Iatrogenic pneumothorax from left subclavian triple-lumen placement  12/28 admitted covid, ARDS 12/30 ECMO cannulation, iatrogenic pneumothrorax, DVT, Bivalrudin started           Left subclavian hematoma, chest tube, ceftriaxone/ azithromycin started 12/31 1Unit PRBC 1/1 Palliative consult, noted HTN, fentanyl only 1/2 Extubation I/O positive, left lung re-expanded, EF 60-65%, Lasix 20mg  BID 1/3 BiPAP in place, HTN to 190s, 200s, labetolol for BP, I/O even, cefepime and vancomycin started 1/4 agitation off BiPAP, possible aspiration 1/5 agitation overnight, reintubated      ECHO with RV dilation, ECMO cannula repositioned, LUE DVT (+), 1/2 dose tPA given,      I/O up, on lasix, Epi, NE, vasopressin started, HR/ rhythm issues noted 1/6 Tracheostomy, hypoglycemic when off tube feeds       Cortrack tune issues 1/7 on lasix gtt 4mg /hr with diuresis, agitation improved 1/8 starting steroid taper 1/9 severe agitation, heavy sedation required, lasix gtt 6mg /hr, I/Os (-)       precedex and fentanyl 1/10 Vanc stopped 1/11 sweep to 2, lasix gtt at 4mg /hr, weight up, metoprolol added for HTN 1/12 fevers to 99 noted, lasix gtt and metazolone 1/13 lasix gtt dced 1/14  intermittent HTN, possibly flash pulmonary edema tied to sedation        Ischemic left hand changes, 1 Unit PRBCs 1/15 1/16 sweep from 4 to 2.5, chest tube removed 1/17 sweep at 6, trial of nebulized morphine for cough, LUE dopplers show no obstruction 1/18 two events of 2nd degree type II HB noted with coughing         sweep at 4, IV lasix 40, acetazolamide, distal XLT trach placement         agitation and BP spikes 1/19 agitation, air hunger, seroquel, klonopin, oxy, valproate, ketamine trial 1/20 sweep at 3.5, diuresis results in chugging, albumin given         1 Unit PRBCs, lidocaine nebulizers for cough, ancef started 1/21 sweep to 8, anxiety, I/Os (+) with lasix and acetazolamide 1/22 sweep at 5, chest CT bilateral upper lobe PEs 1/23 sweep at 5, delirium, I/Os even with lasix and acetazolamide         continued coughing, 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 1/24 sweep at 7.5, 1 Unit PRBCs, repeat bronchoscopy showed resolution of 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 1/25 sweep at 7.5, on propofol, lasix gtt at 64ml/hr, I/Os (+)         nebulized morphine and lidocaine for cough 1/26 sweep at 8, propofol/ precedex, cough present when weaned, lasix gtt at 39ml/hr        ECMO circuit changes, levophed started, 1 Unit PRBCs 1/27 sweep at 4, off sedation, delirium, I/Os even        lasix gtt restated, acetazolamide overnight 1/28 sweep at 7.5, HTN  labile 1/29 sweep of 6, patient reports a tickle in throat, cetacaine 1/30 sweep down to 6, cough improved with cetacaine and gabapentin 1/31 sweep at 5, agitation, off acetazolamide, lasix, weight up 2/1 cough, of pulmicort, cetacaine for cough, continued gabapentin, dexmethorphan       CT demonstrated trach placement against back of trachea 2/2 Trach collar trial somewhat effective in managing cough       low dose diltiazem for HR,  2/3 sweep at 4,trach cannula replacement (longer, more flexible bivona); brochoscopy shows  irritated mucosa       IV lasix and acetazolamide 2/4 sweep at 5, I/Os (+), BUN elevated, lasix gtt with diamox,       trach decannulation, increased gabapentin for cough 2/5 start HCAP coverage for rising WBC/temps, check Pct/cultures, CXR worse with lack of PEEP, diuresed well   Past Medical History  none  Interim history/subjective:  Diuresed well Coughed a ton last night  Objective   Blood pressure 103/65, pulse (!) 113, temperature 99.5 F (37.5 C), temperature source Axillary, resp. rate (!) 25, height 5\' 4"  (1.626 m), weight 67 kg, SpO2 100 %.    Vent Mode: SIMV;PCV;PSV FiO2 (%):  [40 %] 40 % Set Rate:  [24 bmp] 24 bmp PEEP:  [10 cmH20] 10 cmH20 Pressure Support:  [16 cmH20] 16 cmH20   Intake/Output Summary (Last 24 hours) at 04/17/2020 0716 Last data filed at 04/17/2020 0700 Gross per 24 hour  Intake 2608.84 ml  Output 5500 ml  Net -2891.16 ml   Filed Weights   04/15/20 0500 04/16/20 0452 04/17/20 0600  Weight: 69.1 kg 70 kg 67 kg    Examination: Constitutional: chronically ill man in NAD  Eyes: EOMI, pupils equal Ears, nose, mouth, and throat: trach stoma covered Cardiovascular: RRR, ext warm Respiratory: shallow inspiratory efforts Gastrointestinal: soft, hypoactive BS Skin: No rashes, normal turgor Neurologic: moves all 4 ext and interacts with overnight nursing, will not do this for me but did get oxycodone Psychiatric: RASS 0    Net -2.8L K low, repleting ABG looks good Cr slightly up CBG good WBC worse H/H stable Plts stable LDH good Temps are slowly rising CXR worse aeration with loss of PEEP after trach removed CT chest 2/1: 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, no frank fibrosis, ongoing airspace disease, small R effusion; resident re examined R chest 06/15/20 2/4 and minimal effusion  Assessment & Plan:  Acute hypoxemic/hypercapnic respiratory failure due to severe ARDS from COVID-19 pneumonia Probable acute PE, and RV dysfunction s/p  TPA Refractory coughing Strep group F/enterococcal pneumonia- s/p 10 days vanc 1/29 - Continue VV ECMO support, sweep weans as able - Current cough regimen:  dextromethorphan, gabapentin - Continue lung protective ventilation - Today, discuss lasix gtt with CHF team, continue diamox to prevent contraction alkalosis, add standing duonebs and switch seroquel from PRN to standing at night  HTN - Trials of reducing HR/BP have resulted in reduced pCO2 (likely related to fractional flow through ECMO circuit) - continue increased coreg, increase diltiazem  Acute delirium, with agitation- improved -  RASS goal -1 to 0, currently on klonipin 1mg  qAM 3mg  qHS, PRN, morphine PRN, oxycodone 15mg  q6h - Versed drip is off, qHS seroquel changed from PRN to standing  Worsened leukocytosis/fever- my biggest suspicion is related to aspiration from coughing fits.  Will tx as HCAP for now and try to get culture data - Vanc/cefepime, Pct/blood cx/sputum cx/UA  Gastroparesis with some element ileus: improved but occasional vomiting spells with coughing  attacks - Continue reglan, strengthened bowel regimen (miralax, docusate, fiberpack), QTc okay 1/31   Ischemic changes L hand - wound following, appreciate dressing recommendations - surgical consult for L hand once more stable  Poorly controlled diabetes type II -Continue levemir, SSI; looks good  Acute urinary retention -With fevers/white count, take out foley, trial of condom cath with PRN bladder scans, replace if occurs again - Hold on bethenacol (off since 1/30)  Hypernatremia, fluid balance- will d/w CHF team, need to keep even to neg but keep balanced diuresis   Daily Goals Checklist  Pain/Anxiety/Delirium protocol (if indicated): see above VAP protocol (if indicated): bundle in place.  DVT prophylaxis: bivalirudin GI prophylaxis: pantoprazole Glucose control: Euglcyemic on combination of SSI and Basal insulin. Mobility/therapy needs:  Mobilization as tolerated, appreciate PT help Code Status: Full code Disposition: ICU.    Patient critically ill due to COVID ARDS Interventions to address this today vent wean, ECMO wean Risk of deterioration without these interventions is high  I personally spent 38 minutes providing critical care not including any separately billable procedures  Myrla Halsted MD [facility] 04/17/2020 7:16 AM Prefer epic messenger for cross cover needs If after hours, please call E-link

## 2020-04-17 NOTE — Progress Notes (Signed)
Pharmacy Antibiotic Note  Alex [name] is a 50 y.o. male on ECMO. WBC= 20.9 and pharmacy consulted for vancomycin and cefepime  Plan: -Cefepime 2gm IV q8h -Vancomycin 1250mg  IV x1 followed by 1000mg  IV q12h -Will follow renal function, cultures and clinical progress    Height: 5\' 4"  (162.6 cm) Weight: 67 kg (147 lb 11.3 oz) IBW/kg (Calculated) : 59.2  Temp (24hrs), Avg:98.9 F (37.2 C), Min:98.4 F (36.9 C), Max:99.5 F (37.5 C)  Recent Labs  Lab 04/15/20 0413 04/15/20 0415 04/15/20 1608 04/16/20 0353 04/16/20 1611 04/17/20 0435  WBC  --  16.1* 13.7* 11.1* 17.2* 20.9*  CREATININE  --  0.74 0.71 0.79 0.77 0.73  LATICACIDVEN 0.8  --  0.8 0.7 0.5 1.1    Estimated Creatinine Clearance: 95.6 mL/min (by C-G formula based on SCr of 0.73 mg/dL).    No Known Allergies   Thank you for allowing pharmacy to be a part of this patient's care.  06/14/20, PharmD Clinical Pharmacist **Pharmacist phone directory can now be found on [email or web address] ([email or web address]).  Listed under [facility].

## 2020-04-17 NOTE — Progress Notes (Signed)
ANTICOAGULATION CONSULT NOTE  Pharmacy Consult for bivalirudin Indication: ECMO and VTE  Labs: Recent Labs    04/15/20 0415 04/15/20 0936 04/16/20 0353 04/16/20 0355 04/16/20 1611 04/16/20 1614 04/17/20 0435 04/17/20 1157 04/17/20 1606 04/17/20 1707  HGB 8.3*   < > 7.7*   < > 10.6*   < > 9.9* 9.2* 8.4* 8.8*  HCT 27.7*   < > 26.0*   < > 34.3*   < > 33.0* 27.0* 28.1* 26.0*  PLT 258   < > 239  --  306  --  262  --  215  --   APTT 78*   < > 68*  --  73*  --  67*  --  89*  --   LABPROT 17.7*  --  18.2*  --   --   --  16.9*  --   --   --   INR 1.5*  --  1.6*  --   --   --  1.4*  --   --   --   CREATININE 0.74   < > 0.79  --  0.77  --  0.73  --  1.10  --    < > = values in this interval not displayed.    Assessment: 5 yoM admitted with COVID-19 PNA with worsening hypoxia, s/p cannulation for ECMO. Pt was started on IV heparin prior to cannulation due to acute DVTs and possible PE, transitioned to bivalirudin with ECMO. Now s/p tPA on 1/5 and tracheostomy on 1/6. Circuit last changed 1/26.  APTT now above goal at 89 seconds, has been stable prior to this evening. No bleeding issues noted, hgb stable 8.8.   Patient has been relatively stable on currently dose, will make slight rate change and follow up in am  Goal of Therapy:  aPTT 60-80 seconds   Plan:  -Reduce bivalirudin to 0.07 mg/kg/hr (using order-specific wt 72.1kg) -Monitor q12h aPTT/CBC, LDH, and for s/sx of bleeding  Sheppard Coil PharmD., BCPS Clinical Pharmacist 04/17/2020 6:17 PM

## 2020-04-17 NOTE — Progress Notes (Addendum)
Patient ID: Alex [name], male   DOB: 1970-05-27, 50 y.o.   MRN: [id number]     Advanced Heart Failure Rounding Note  PCP-Cardiologist: No primary care provider on file.   Subjective:    - 12/30: VV ECMO cannulation - 12/31: Left chest tube replaced - 1/2: Extubated. Echo with EF 60-65%, mildly dilated RV with mildly decreased systolic function.  - 1/4: Agitated, suspected aspiration.  Re-intubated.  - 1/5: ECMO cannula repositioned under TEE guidance. TEE showed moderately dilated/moderate-severely dysfunctional RV in setting of hypoxemia. LUE DVT found.  Patient got 1/2 dose of TPA due to initial concern for large PE.  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  - 1/6: Tracheostomy - 1/7: Echo with mild RV dilation/mild RV dysfunction.  - 1/16: Left chest tube out - 1/17: LUE arterial dopplers repeated, showed no obstruction.  - 1/20: Echo with EF 65-70%, mildly D-shaped septum, mildly dilated and mildly dysfunctional RV.  - 1/22: CTA chest: Bilateral upper lobe PEs (suspect chronic), changes c/w ARDS - 1/23: Bronchoscopy showed semi-occlusive ?mass/polyp in the trachea.  - 1/24: Bronchoscopy showed resolution of 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 - 1/26: ECMO circuit changed.  - 2/1: CT chest showed diffuse bronchiectasis as well as diffuse opacity consistent with COVID-19 PNA with ARDS. - 2/3: Trach exchange  Sweep 6 today, CXR mildly worse.  Cough improved since trach exchange. Attempted to wean sweep yesterday but had significant rise in PaCO2 and fall in pH.  He is now off abx. WBCs 21 with Tm 99.5.    I/Os markedly negative with weight down, stable BUN/creatinine.  He is on Lasix gtt at 6 + acetazolamide 500 bid.    ECMO parameters: 3400 rpm Flow 4.4 L/min Pvenous -80 Delta P 30 Sweep 6 FiO2 0.4 VT 380  ABG 7.38/59.5/63/90% LDH  1,117 => 911 => 751 => 699 => 691 => 534 => 488 => 547 => 461 => 474 => 489 => 553 => 449 => 457 => 421  PTT 67 Lactate  0.7  Objective:   Weight Range: 67 kg Body mass index is 25.35 kg/m.   Vital Signs:   Temp:  [98.4 F (36.9 C)-99.5 F (37.5 C)] 99.5 F (37.5 C) (02/05 0400) Pulse Rate:  [91-128] 113 (02/05 0700) Resp:  [11-44] 25 (02/05 0700) BP: (103-158)/(65-95) 103/65 (02/05 0700) SpO2:  [91 %-100 %] 100 % (02/05 0700) Arterial Line BP: (124-222)/(65-104) 124/67 (02/05 0700) FiO2 (%):  [40 %] 40 % (02/04 0823) Weight:  [67 kg] 67 kg (02/05 0600) Last BM Date: 04/16/20  Weight change: Filed Weights   04/15/20 0500 04/16/20 0452 04/17/20 0600  Weight: 69.1 kg 70 kg 67 kg    Intake/Output:   Intake/Output Summary (Last 24 hours) at 04/17/2020 0749 Last data filed at 04/17/2020 0700 Gross per 24 hour  Intake 2608.84 ml  Output 5500 ml  Net -2891.16 ml      Physical Exam    General: NAD Neck: No JVD, no thyromegaly or thyroid nodule.  Lungs: Crackles bilaterally.  CV: Nondisplaced PMI.  Heart regular S1/S2, no S3/S4, no murmur.  No peripheral edema.   Abdomen: Soft, nontender, no hepatosplenomegaly, no distention.  Skin: Intact without lesions or rashes.  Neurologic: Wakes up and follows commands. Extremities: Dry gangrene digits left hand.  HEENT: Normal.    Telemetry   NSR 100s Personally reviewed   Labs    CBC Recent Labs    04/16/20 1611 04/16/20 1614 04/17/20 0414 04/17/20 0435  WBC 17.2*  --   --  20.9*  HGB 10.6*   < > 9.9* 9.9*  HCT 34.3*   < > 29.0* 33.0*  MCV 97.4  --   --  95.9  PLT 306  --   --  262   < > = values in this interval not displayed.   Basic Metabolic Panel Recent Labs    66/81/59 1611 04/16/20 1614 04/17/20 0414 04/17/20 0435  NA 147*   < > 146* 145  K 4.5   < > 2.9* 2.9*  CL 104  --   --  100  CO2 32  --   --  33*  GLUCOSE 123*  --   --  119*  BUN 67*  --   --  56*  CREATININE 0.77  --   --  0.73  CALCIUM 9.3  --   --  9.0   < > = values in this interval not displayed.   Liver Function Tests No results for input(s): AST,  ALT, ALKPHOS, BILITOT, PROT, ALBUMIN in the last 72 hours. No results for input(s): LIPASE, AMYLASE in the last 72 hours. Cardiac Enzymes No results for input(s): CKTOTAL, CKMB, CKMBINDEX, TROPONINI in the last 72 hours.  BNP: BNP (last 3 results) No results for input(s): BNP in the last 8760 hours.  ProBNP (last 3 results) No results for input(s): PROBNP in the last 8760 hours.   D-Dimer No results for input(s): DDIMER in the last 72 hours. Hemoglobin A1C No results for input(s): HGBA1C in the last 72 hours. Fasting Lipid Panel No results for input(s): CHOL, HDL, LDLCALC, TRIG, CHOLHDL, LDLDIRECT in the last 72 hours. Thyroid Function Tests No results for input(s): TSH, T4TOTAL, T3FREE, THYROIDAB in the last 72 hours.  Invalid input(s): FREET3  Other results:   Imaging    DG CHEST PORT 1 VIEW  Result Date: 04/17/2020 CLINICAL DATA:  History of ECMO. EXAM: PORTABLE CHEST 1 VIEW COMPARISON:  Yesterday FINDINGS: Tracheostomy tube is no longer seen. Feeding tube with tip reaching the stomach at least. Right PICC with obscured tip. Unchanged ECMO cannula. Possibly progressed confluent bilateral airspace disease. Stable heart size. No visible air leak. IMPRESSION: 1. Unchanged remaining hardware. 2. Stable or progressive pulmonary density. Electronically Signed   By: Marnee Spring M.D.   On: 04/17/2020 07:14   DG Abd Portable 1V  Result Date: 04/16/2020 CLINICAL DATA:  Nasoenteric feeding tube repositioning EXAM: PORTABLE ABDOMEN - 1 VIEW COMPARISON:  04/10/2020 FINDINGS: Nasoenteric feeding tube is seen with its tip overlying the expected proximal jejunum, similar to that noted on prior examination. Normal abdominal gas pattern. ECMO cannula overlies its expected position bibasilar pulmonary infiltrates with air bronchograms and small right pleural effusion again noted. IMPRESSION: Nasoenteric feeding tube tip within the expected proximal jejunum. Electronically Signed   By: Helyn Numbers MD   On: 04/16/2020 12:35     Medications:     Scheduled Medications: . acetaZOLAMIDE  500 mg Per Tube BID  . carvedilol  25 mg Oral BID WC  . chlorhexidine  15 mL Mouth Rinse BID  . Chlorhexidine Gluconate Cloth  6 each Topical Daily  . chlorpheniramine-HYDROcodone  5 mL Per Tube Q12H  . clonazePAM  1 mg Per Tube Daily  . clonazePAM  3 mg Per Tube QHS  . cloNIDine  0.1 mg Per Tube Q8H  . dextromethorphan  30 mg Per Tube TID  . diltiazem  60 mg Per Tube Q6H  . docusate  100 mg Per Tube BID  . fiber  1 packet Per Tube BID  . free water  200 mL Per Tube Q4H  . gabapentin  900 mg Per Tube Q8H  . insulin aspart  0-20 Units Subcutaneous Q4H  . insulin aspart  4 Units Subcutaneous Q4H  . insulin detemir  38 Units Subcutaneous BID  . ipratropium-albuterol  3 mL Nebulization QID  . lactobacillus acidophilus  2 tablet Per Tube TID  . liver oil-zinc oxide   Topical 5 X Daily  . mouth rinse  15 mL Mouth Rinse q12n4p  . melatonin  3 mg Per Tube QHS  . metoCLOPramide (REGLAN) injection  10 mg Intravenous Q8H  . nutrition supplement (JUVEN)  1 packet Per Tube BID BM  . oxyCODONE  15 mg Per Tube Q6H  . pantoprazole sodium  40 mg Per Tube QHS  . potassium chloride  40 mEq Per Tube Q4H  . QUEtiapine  100-300 mg Per Tube QHS  . sennosides  10 mL Per Tube QHS  . sodium chloride flush  10-40 mL Intracatheter Q12H    Infusions: . sodium chloride    . sodium chloride Stopped (04/16/20 0934)  . sodium chloride 250 mL (04/07/20 2203)  . sodium chloride Stopped (03/12/20 0131)  . albumin human 12.5 g (04/11/20 1558)  . bivalirudin (ANGIOMAX) infusion 0.5 mg/mL (Non-ACS indications) 0.08 mg/kg/hr (04/17/20 0700)  . ceFEPime (MAXIPIME) IV    . feeding supplement (PIVOT 1.5 CAL) 1,000 mL (04/17/20 0742)  . furosemide (LASIX) 200 mg in dextrose 5% 100 mL (2mg /mL) infusion 6 mg/hr (04/17/20 0700)  . norepinephrine (LEVOPHED) Adult infusion Stopped (04/15/20 2322)  . vancomycin    .  vancomycin      PRN Medications: Place/Maintain arterial line **AND** sodium chloride, sodium chloride, sodium chloride, acetaminophen (TYLENOL) oral liquid 160 mg/5 mL, albumin human, [DISCONTINUED] lidocaine **AND** albuterol, guaiFENesin, hydrALAZINE, labetalol, lip balm, midazolam, morphine, morphine CONCENTRATE, ondansetron (ZOFRAN) IV, phenol, polyethylene glycol, simethicone, sodium chloride flush   Assessment/Plan   1. Acute hypoxemic respiratory failure: Due to COVID-19 PNA with bilateral infiltrates.  Refractory hypoxemia, VV-ECMO cannulation on 2020-03-15 with improvement in oxygenation.  Developed left PTX post-subclavian CVL and had left chest tube, the left lung is re-expanded and CT out.  He was extubated 1/2 but reintubated 1/4 with agitation and suspected aspiration.  Tracheostomy 1/6.  CTA chest 1/22 with suspected chronic PEs and ARDS. ECMO cannula repositioned 1/5. ECMO circuit changed 1/26. LDH stable. Sweep at 5, have struggled with hypercarbia from significant dead space ventilation.  Now off abx but WBCs and fever curve trending up. PCO2 59.5. HCO3 33.  I/Os markedly negative yesterday  Cough seems better with tracheostomy exchange.  - Continue to try to wean sweep, will allow lower pH.  - Decrease Lasix gtt to 3 mg/hr with Diamox 500 bid today.   - Patient has had remdesivir, tocilizumab. - Completed steroid taper. - Continue bivalirudin, goal PTT 65-80. He is at 2 today. Discussed dosing with PharmD personally.  - Continue to mobilize  - Coreg increased to 25 mg bid to increase fractional flow via ECMO circuit. Diltiazem 30 q6 added.  - Will need to consider for lung transplantation if no improvement.  2. 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  Clot noted on TEE in RV as well.  TTE 1/2 showed normal EF 60-65%, RV improved (mildly dilated/dysfunctional). TEE on 1/5 with moderate to severe RV dysfunction but  patient was hypoxemic.  Had 1/2 dose TPA on 1/5.  Echo 1/20 with mildly dilated/mildly dysfunctional RV. CTA chest 1/22 with chronic-appearing PEs in upper lobes. - Bivalirudin for goal PTT 65-80.  He is 69 today. Discussed dosing with PharmD personally. 3. Left PTX: Left chest tube, lung is re-expanded. Tube now out, stable CXR. 4. Shock: Suspect septic/distributive.  Now resolved, off NE.  5. Anemia: Hgb 7.5, transfuse < 8.  Getting 1 unit PRBCs this morning.  6. AKI: Stable.  Following with diuresis.   7. Hyperglycemia: insulin.  8. HTN: BP stable.   - On clonidine.  - Continue Coreg 25 mg bid.   - Diltiazem 30 q6 hrs - Suspect arterial line inaccurate, following cuff for now.  9. CHB: Episode of CHB when hypoxemic and with cough (suspect vagal).  NSR since then.    - Continue Coreg and diltiazem, watch rhythm.  10. Thrombocytopenia: Resolved  11. Ileus: Resolved. TFs restarted.  - Getting Reglan  - Cor-trak repositioned to post-pyloric placement 12. Ischemic digits: LUE.  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  Repeat study 1/17 showed no obstruction.  - Wound Care following. 13. ID: Group F Strep in sputum.  Also with Enterococcus faecalis in sputum. Completed abx for Enterococcus. Now with rising WBCs again and higher fever curve. - Panculture.  - Start HCAP coverage.   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   15. Hypernatremia: Continue free water.   CRITICAL CARE Performed by: Marca Ancona  Total critical care time: 40 minutes  Critical care time was exclusive of separately billable procedures and treating other patients.  Critical care was necessary to treat or prevent imminent or life-threatening deterioration.  Critical care was time spent personally by me on the following activities: development of treatment plan with patient and/or surrogate as well  as nursing, discussions with consultants, evaluation of patient's response to treatment, examination of patient, obtaining history from patient or surrogate, ordering and performing treatments and interventions, ordering and review of laboratory studies, ordering and review of radiographic studies, pulse oximetry and re-evaluation of patient's condition.    Length of Stay: 65  Marca Ancona, MD  04/17/2020, 7:49 AM  Advanced Heart Failure Team Pager [phone] (M-F; 7a - 4p)  Please contact [facility] for night-coverage after hours (4p -7a ) and weekends on [email or web address]

## 2020-04-17 NOTE — Progress Notes (Signed)
ANTICOAGULATION CONSULT NOTE  Pharmacy Consult for bivalirudin Indication: ECMO and VTE  Labs: Recent Labs    04/15/20 0415 04/15/20 0936 04/16/20 0353 04/16/20 0355 04/16/20 1611 04/16/20 1614 04/17/20 0006 04/17/20 0414 04/17/20 0435  HGB 8.3*   < > 7.7*   < > 10.6*   < > 9.9* 9.9* 9.9*  HCT 27.7*   < > 26.0*   < > 34.3*   < > 29.0* 29.0* 33.0*  PLT 258   < > 239  --  306  --   --   --  262  APTT 78*   < > 68*  --  73*  --   --   --  67*  LABPROT 17.7*  --  18.2*  --   --   --   --   --  16.9*  INR 1.5*  --  1.6*  --   --   --   --   --  1.4*  CREATININE 0.74   < > 0.79  --  0.77  --   --   --  0.73   < > = values in this interval not displayed.    Assessment: 40 yoM admitted with COVID-19 PNA with worsening hypoxia, s/p cannulation for ECMO. Pt was started on IV heparin prior to cannulation due to acute DVTs and possible PE, transitioned to bivalirudin with ECMO. Now s/p tPA on 1/5 and tracheostomy on 1/6. Circuit last changed 1/26.  APTT remains therapeutic at 67 seconds and CBC stable.  Goal of Therapy:  aPTT 60-80 seconds   Plan:  -Continue bivalirudin at 0.08 mg/kg/hr (using order-specific wt 72.1kg) -Monitor q12h aPTT/CBC, LDH, and for s/sx of bleeding   Harland German, PharmD Clinical Pharmacist **Pharmacist phone directory can now be found on [email or web address] ([email or web address]).  Listed under [facility].

## 2020-04-17 NOTE — Procedures (Signed)
Extracorporeal support note  ECLS cannulation date: 02/12/2020 Last circuit change: 04/07/2020  Indication: Acute hypoxic respiratory failure due to ARDS from COVID-19 pneumonia with RV dysfunction.   Configuration: Venovenous  Drainage cannula: 32 French crescent cannula via right IJ Return cannula: Same  Pump speed: 3450 RPM Pump flow: 4.35 L/min Pump used: Cardio help  Oxygenator: Cardio help O2 blender: 100% Sweep gas: 6L  Circuit check: minimal clots in connectors Anticoagulant: Bivalirudin Anticoagulation targets: PTT 60-80  Changes in support:  See how he does today with coughing without trach, work on sweep wean.  Check cultures and start HCAP coverage.  Anticipated goals/duration of support: Bridge to recovery.  Multidisciplinary ECMO rounds completed.  Myrla Halsted MD PCCM 04/17/2020

## 2020-04-17 NOTE — Plan of Care (Signed)
  Problem: Health Behavior/Discharge Planning: Goal: Ability to manage health-related needs will improve Outcome: Progressing   Problem: Clinical Measurements: Goal: Ability to maintain clinical measurements within normal limits will improve Outcome: Progressing Goal: Will remain free from infection Outcome: Progressing Goal: Diagnostic test results will improve Outcome: Progressing Goal: Respiratory complications will improve Outcome: Progressing Goal: Cardiovascular complication will be avoided Outcome: Progressing   Problem: Activity: Goal: Risk for activity intolerance will decrease Outcome: Progressing   Problem: Coping: Goal: Level of anxiety will decrease Outcome: Progressing   Problem: Pain Managment: Goal: General experience of comfort will improve Outcome: Progressing   Problem: Elimination: Goal: Will not experience complications related to bowel motility Outcome: Progressing Goal: Will not experience complications related to urinary retention Outcome: Progressing   Problem: Safety: Goal: Ability to remain free from injury will improve Outcome: Progressing   Problem: Skin Integrity: Goal: Risk for impaired skin integrity will decrease Outcome: Progressing   Problem: Education: Goal: Knowledge of risk factors and measures for prevention of condition will improve Outcome: Progressing   Problem: Coping: Goal: Psychosocial and spiritual needs will be supported Outcome: Progressing   Problem: Respiratory: Goal: Will maintain a patent airway Outcome: Progressing Goal: Complications related to the disease process, condition or treatment will be avoided or minimized Outcome: Progressing   Problem: Activity: Goal: Ability to tolerate increased activity will improve Outcome: Progressing   Problem: Respiratory: Goal: Ability to maintain a clear airway and adequate ventilation will improve Outcome: Progressing   Problem: Role Relationship: Goal: Method  of communication will improve Outcome: Progressing

## 2020-04-18 ENCOUNTER — Inpatient Hospital Stay (HOSPITAL_COMMUNITY): Payer: Medicaid Other

## 2020-04-18 DIAGNOSIS — A4181 Sepsis due to Enterococcus: Secondary | ICD-10-CM

## 2020-04-18 DIAGNOSIS — B952 Enterococcus as the cause of diseases classified elsewhere: Secondary | ICD-10-CM

## 2020-04-18 DIAGNOSIS — I330 Acute and subacute infective endocarditis: Secondary | ICD-10-CM

## 2020-04-18 LAB — POCT I-STAT 7, (LYTES, BLD GAS, ICA,H+H)
Acid-Base Excess: 6 mmol/L — ABNORMAL HIGH (ref 0.0–2.0)
Acid-Base Excess: 6 mmol/L — ABNORMAL HIGH (ref 0.0–2.0)
Acid-Base Excess: 6 mmol/L — ABNORMAL HIGH (ref 0.0–2.0)
Acid-Base Excess: 4 mmol/L — ABNORMAL HIGH (ref 0.0–2.0)
Acid-Base Excess: 6 mmol/L — ABNORMAL HIGH (ref 0.0–2.0)
Acid-Base Excess: 5 mmol/L — ABNORMAL HIGH (ref 0.0–2.0)
Bicarbonate: 33.6 mmol/L — ABNORMAL HIGH (ref 20.0–28.0)
Bicarbonate: 34.1 mmol/L — ABNORMAL HIGH (ref 20.0–28.0)
Bicarbonate: 33.4 mmol/L — ABNORMAL HIGH (ref 20.0–28.0)
Bicarbonate: 31.5 mmol/L — ABNORMAL HIGH (ref 20.0–28.0)
Bicarbonate: 32.4 mmol/L — ABNORMAL HIGH (ref 20.0–28.0)
Bicarbonate: 30.1 mmol/L — ABNORMAL HIGH (ref 20.0–28.0)
Calcium, Ion: 1.23 mmol/L (ref 1.15–1.40)
Calcium, Ion: 1.23 mmol/L (ref 1.15–1.40)
Calcium, Ion: 1.24 mmol/L (ref 1.15–1.40)
Calcium, Ion: 1.23 mmol/L (ref 1.15–1.40)
Calcium, Ion: 1.23 mmol/L (ref 1.15–1.40)
Calcium, Ion: 1.24 mmol/L (ref 1.15–1.40)
HCT: 33 % — ABNORMAL LOW (ref 39.0–52.0)
HCT: 27 % — ABNORMAL LOW (ref 39.0–52.0)
HCT: 25 % — ABNORMAL LOW (ref 39.0–52.0)
HCT: 26 % — ABNORMAL LOW (ref 39.0–52.0)
HCT: 25 % — ABNORMAL LOW (ref 39.0–52.0)
HCT: 27 % — ABNORMAL LOW (ref 39.0–52.0)
Hemoglobin: 11.2 g/dL — ABNORMAL LOW (ref 13.0–17.0)
Hemoglobin: 9.2 g/dL — ABNORMAL LOW (ref 13.0–17.0)
Hemoglobin: 8.5 g/dL — ABNORMAL LOW (ref 13.0–17.0)
Hemoglobin: 8.8 g/dL — ABNORMAL LOW (ref 13.0–17.0)
Hemoglobin: 8.5 g/dL — ABNORMAL LOW (ref 13.0–17.0)
Hemoglobin: 9.2 g/dL — ABNORMAL LOW (ref 13.0–17.0)
O2 Saturation: 98 %
O2 Saturation: 97 %
O2 Saturation: 96 %
O2 Saturation: 93 %
O2 Saturation: 99 %
O2 Saturation: 96 %
Patient temperature: 98.4
Patient temperature: 36.8
Patient temperature: 36.8
Patient temperature: 36.8
Patient temperature: 36.8
Patient temperature: 98.3
Potassium: 3.4 mmol/L — ABNORMAL LOW (ref 3.5–5.1)
Potassium: 4 mmol/L (ref 3.5–5.1)
Potassium: 3.8 mmol/L (ref 3.5–5.1)
Potassium: 3.8 mmol/L (ref 3.5–5.1)
Potassium: 3.7 mmol/L (ref 3.5–5.1)
Potassium: 5 mmol/L (ref 3.5–5.1)
Sodium: 144 mmol/L (ref 135–145)
Sodium: 144 mmol/L (ref 135–145)
Sodium: 145 mmol/L (ref 135–145)
Sodium: 145 mmol/L (ref 135–145)
Sodium: 144 mmol/L (ref 135–145)
Sodium: 145 mmol/L (ref 135–145)
TCO2: 36 mmol/L — ABNORMAL HIGH (ref 22–32)
TCO2: 36 mmol/L — ABNORMAL HIGH (ref 22–32)
TCO2: 35 mmol/L — ABNORMAL HIGH (ref 22–32)
TCO2: 33 mmol/L — ABNORMAL HIGH (ref 22–32)
TCO2: 34 mmol/L — ABNORMAL HIGH (ref 22–32)
TCO2: 32 mmol/L (ref 22–32)
pCO2 arterial: 67.1 mmHg (ref 32.0–48.0)
pCO2 arterial: 70 mmHg (ref 32.0–48.0)
pCO2 arterial: 65.9 mmHg (ref 32.0–48.0)
pCO2 arterial: 61.8 mmHg — ABNORMAL HIGH (ref 32.0–48.0)
pCO2 arterial: 54.6 mmHg — ABNORMAL HIGH (ref 32.0–48.0)
pCO2 arterial: 48.3 mmHg — ABNORMAL HIGH (ref 32.0–48.0)
pH, Arterial: 7.307 — ABNORMAL LOW (ref 7.350–7.450)
pH, Arterial: 7.295 — ABNORMAL LOW (ref 7.350–7.450)
pH, Arterial: 7.312 — ABNORMAL LOW (ref 7.350–7.450)
pH, Arterial: 7.315 — ABNORMAL LOW (ref 7.350–7.450)
pH, Arterial: 7.381 (ref 7.350–7.450)
pH, Arterial: 7.402 (ref 7.350–7.450)
pO2, Arterial: 110 mmHg — ABNORMAL HIGH (ref 83.0–108.0)
pO2, Arterial: 102 mmHg (ref 83.0–108.0)
pO2, Arterial: 91 mmHg (ref 83.0–108.0)
pO2, Arterial: 74 mmHg — ABNORMAL LOW (ref 83.0–108.0)
pO2, Arterial: 127 mmHg — ABNORMAL HIGH (ref 83.0–108.0)
pO2, Arterial: 80 mmHg — ABNORMAL LOW (ref 83.0–108.0)

## 2020-04-18 LAB — BASIC METABOLIC PANEL WITH GFR
Anion gap: 12 (ref 5–15)
BUN: 86 mg/dL — ABNORMAL HIGH (ref 6–20)
CO2: 29 mmol/L (ref 22–32)
Calcium: 8.7 mg/dL — ABNORMAL LOW (ref 8.9–10.3)
Chloride: 104 mmol/L (ref 98–111)
Creatinine, Ser: 0.94 mg/dL (ref 0.61–1.24)
GFR, Estimated: >60 mL/min
Glucose, Bld: 372 mg/dL — ABNORMAL HIGH (ref 70–99)
Potassium: 3.7 mmol/L (ref 3.5–5.1)
Sodium: 145 mmol/L (ref 135–145)

## 2020-04-18 LAB — CBC
HCT: 28.4 % — ABNORMAL LOW (ref 39.0–52.0)
HCT: 26.9 % — ABNORMAL LOW (ref 39.0–52.0)
HCT: 26.1 % — ABNORMAL LOW (ref 39.0–52.0)
Hemoglobin: 8.7 g/dL — ABNORMAL LOW (ref 13.0–17.0)
Hemoglobin: 7.9 g/dL — ABNORMAL LOW (ref 13.0–17.0)
Hemoglobin: 7.7 g/dL — ABNORMAL LOW (ref 13.0–17.0)
MCH: 30 pg (ref 26.0–34.0)
MCH: 29 pg (ref 26.0–34.0)
MCH: 29.1 pg (ref 26.0–34.0)
MCHC: 30.6 g/dL (ref 30.0–36.0)
MCHC: 29.4 g/dL — ABNORMAL LOW (ref 30.0–36.0)
MCHC: 29.5 g/dL — ABNORMAL LOW (ref 30.0–36.0)
MCV: 97.9 fL (ref 80.0–100.0)
MCV: 98.9 fL (ref 80.0–100.0)
MCV: 98.5 fL (ref 80.0–100.0)
Platelets: 206 10*3/uL (ref 150–400)
Platelets: 196 10*3/uL (ref 150–400)
Platelets: 187 10*3/uL (ref 150–400)
RBC: 2.9 MIL/uL — ABNORMAL LOW (ref 4.22–5.81)
RBC: 2.72 MIL/uL — ABNORMAL LOW (ref 4.22–5.81)
RBC: 2.65 MIL/uL — ABNORMAL LOW (ref 4.22–5.81)
RDW: 17.8 % — ABNORMAL HIGH (ref 11.5–15.5)
RDW: 17.9 % — ABNORMAL HIGH (ref 11.5–15.5)
RDW: 17.8 % — ABNORMAL HIGH (ref 11.5–15.5)
WBC: 21.8 10*3/uL — ABNORMAL HIGH (ref 4.0–10.5)
WBC: 17.1 10*3/uL — ABNORMAL HIGH (ref 4.0–10.5)
WBC: 16.1 10*3/uL — ABNORMAL HIGH (ref 4.0–10.5)
nRBC: 0.1 % (ref 0.0–0.2)
nRBC: 0.1 % (ref 0.0–0.2)
nRBC: 0 % (ref 0.0–0.2)

## 2020-04-18 LAB — BASIC METABOLIC PANEL
Anion gap: 11 (ref 5–15)
BUN: 78 mg/dL — ABNORMAL HIGH (ref 6–20)
CO2: 30 mmol/L (ref 22–32)
Calcium: 8.6 mg/dL — ABNORMAL LOW (ref 8.9–10.3)
Chloride: 103 mmol/L (ref 98–111)
Creatinine, Ser: 0.95 mg/dL (ref 0.61–1.24)
GFR, Estimated: >60 mL/min (ref >60)
Glucose, Bld: 292 mg/dL — ABNORMAL HIGH (ref 70–99)
Potassium: 3.6 mmol/L (ref 3.5–5.1)
Sodium: 144 mmol/L (ref 135–145)

## 2020-04-18 LAB — PREPARE RBC (CROSSMATCH)

## 2020-04-18 LAB — GLUCOSE, CAPILLARY
Glucose-Capillary: 262 mg/dL — ABNORMAL HIGH (ref 70–99)
Glucose-Capillary: 220 mg/dL — ABNORMAL HIGH (ref 70–99)
Glucose-Capillary: 311 mg/dL — ABNORMAL HIGH (ref 70–99)
Glucose-Capillary: 331 mg/dL — ABNORMAL HIGH (ref 70–99)
Glucose-Capillary: 241 mg/dL — ABNORMAL HIGH (ref 70–99)

## 2020-04-18 LAB — FIBRINOGEN: Fibrinogen: 610 mg/dL — ABNORMAL HIGH (ref 210–475)

## 2020-04-18 LAB — APTT
aPTT: 76 s — ABNORMAL HIGH (ref 24–36)
aPTT: 70 seconds — ABNORMAL HIGH (ref 24–36)

## 2020-04-18 LAB — ECHOCARDIOGRAM COMPLETE
Area-P 1/2: 4.17 cm2
Height: 64 in
S' Lateral: 2.5 cm
Weight: 2440.93 oz

## 2020-04-18 LAB — HEMOGLOBIN AND HEMATOCRIT, BLOOD
HCT: 29.7 % — ABNORMAL LOW (ref 39.0–52.0)
Hemoglobin: 9 g/dL — ABNORMAL LOW (ref 13.0–17.0)

## 2020-04-18 LAB — LACTIC ACID, PLASMA
Lactic Acid, Venous: 1.2 mmol/L (ref 0.5–1.9)
Lactic Acid, Venous: 1.6 mmol/L (ref 0.5–1.9)

## 2020-04-18 LAB — PROTIME-INR
INR: 1.7 — ABNORMAL HIGH (ref 0.8–1.2)
Prothrombin Time: 19.3 seconds — ABNORMAL HIGH (ref 11.4–15.2)

## 2020-04-18 LAB — PROCALCITONIN: Procalcitonin: 14.49 ng/mL

## 2020-04-18 LAB — LACTATE DEHYDROGENASE: LDH: 464 U/L — ABNORMAL HIGH (ref 98–192)

## 2020-04-18 MED ORDER — FUROSEMIDE 10 MG/ML IJ SOLN
40.0000 mg | Freq: Once | INTRAMUSCULAR | Status: AC
  Administered 2020-04-18: 40 mg via INTRAVENOUS
  Filled 2020-04-18: qty 4

## 2020-04-18 MED ORDER — INSULIN DETEMIR 100 UNIT/ML ~~LOC~~ SOLN
50.0000 [IU] | Freq: Two times a day (BID) | SUBCUTANEOUS | Status: DC
  Filled 2020-04-18 (×2): qty 0.5

## 2020-04-18 MED ORDER — PERFLUTREN LIPID MICROSPHERE
1.0000 mL | INTRAVENOUS | Status: AC | PRN
  Administered 2020-04-18: 1 mL via INTRAVENOUS
  Filled 2020-04-18: qty 10

## 2020-04-18 MED ORDER — INSULIN DETEMIR 100 UNIT/ML ~~LOC~~ SOLN
35.0000 [IU] | Freq: Two times a day (BID) | SUBCUTANEOUS | Status: DC
  Administered 2020-04-18 (×2): 35 [IU] via SUBCUTANEOUS
  Filled 2020-04-18 (×5): qty 0.35

## 2020-04-18 MED ORDER — DILTIAZEM HCL 60 MG PO TABS
30.0000 mg | ORAL_TABLET | Freq: Four times a day (QID) | ORAL | Status: DC
  Administered 2020-04-18 – 2020-04-19 (×3): 30 mg
  Filled 2020-04-18 (×3): qty 1

## 2020-04-18 MED ORDER — SODIUM CHLORIDE 0.9% IV SOLUTION: Freq: Once | INTRAVENOUS | Status: DC

## 2020-04-18 MED ORDER — CLONAZEPAM 1 MG PO TABS
1.0000 mg | ORAL_TABLET | Freq: Four times a day (QID) | ORAL | Status: DC
  Administered 2020-04-18 – 2020-04-20 (×9): 1 mg via ORAL
  Filled 2020-04-18 (×9): qty 1

## 2020-04-18 MED ORDER — POTASSIUM CHLORIDE 20 MEQ PO PACK
40.0000 meq | PACK | Freq: Once | ORAL | Status: AC
  Administered 2020-04-18: 40 meq
  Filled 2020-04-18: qty 2

## 2020-04-18 MED ORDER — POTASSIUM CHLORIDE 20 MEQ PO PACK
40.0000 meq | PACK | ORAL | Status: AC
  Administered 2020-04-18 (×2): 40 meq via ORAL
  Filled 2020-04-18 (×2): qty 2

## 2020-04-18 NOTE — Progress Notes (Signed)
  Echocardiogram 2D Echocardiogram has been performed.  Roosvelt Maser F 04/18/2020, 9:15 AM

## 2020-04-18 NOTE — Progress Notes (Signed)
Patient ID: Alex [name], male   DOB: [birth year], 50 y.o.   MRN: [id number]     Advanced Heart Failure Rounding Note  PCP-Cardiologist: No primary care provider on file.   Subjective:    - 12/30: VV ECMO cannulation - 12/31: Left chest tube replaced - 1/2: Extubated. Echo with EF 60-65%, mildly dilated RV with mildly decreased systolic function.  - 1/4: Agitated, suspected aspiration.  Re-intubated.  - 1/5: ECMO cannula repositioned under TEE guidance. TEE showed moderately dilated/moderate-severely dysfunctional RV in setting of hypoxemia. LUE DVT found.  Patient got 1/2 dose of TPA due to initial concern for large PE.  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  - 1/6: Tracheostomy - 1/7: Echo with mild RV dilation/mild RV dysfunction.  - 1/16: Left chest tube out - 1/17: LUE arterial dopplers repeated, showed no obstruction.  - 1/20: Echo with EF 65-70%, mildly D-shaped septum, mildly dilated and mildly dysfunctional RV.  - 1/22: CTA chest: Bilateral upper lobe PEs (suspect chronic), changes c/w ARDS - 1/23: Bronchoscopy showed semi-occlusive ?mass/polyp in the trachea.  - 1/24: Bronchoscopy showed resolution of 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 - 1/26: ECMO circuit changed.  - 2/1: CT chest showed diffuse bronchiectasis as well as diffuse opacity consistent with COVID-19 PNA with ARDS. - 2/3: Trach exchange - 2/5: Extubate to HFNC  Sweep 4 today, extubated to HFNC.  Comfortable this morning. CXR worse without PEEP.   WBCs 22, PCT 14.5, now afebrile. He is on vancomycin/cefepime for sepsis, now growing Enterococcus faecalis in blood cultures.   I/Os positive, Lasix gtt was stopped yesterday in setting of sepsis.    ECMO parameters: 3450 rpm Flow 4.55 L/min Pvenous -78 Delta P 30 Sweep 4 HFNC 10 L  ABG 7.31/67/110/98% LDH  534 => 488 => 547 => 461 => 474 => 489 => 553 => 449 => 457 => 421 => 464 PTT 76 Lactate 1.2  Objective:   Weight Range: 69.2 kg Body mass  index is 26.19 kg/m.   Vital Signs:   Temp:  [97.6 F (36.4 C)-98.6 F (37 C)] 97.6 F (36.4 C) (02/05 2000) Pulse Rate:  [100-115] 115 (02/06 0700) Resp:  [11-44] 44 (02/06 0700) BP: (70-127)/(51-105) 115/80 (02/06 0700) SpO2:  [95 %-100 %] 95 % (02/06 0700) Arterial Line BP: (92-163)/(54-79) 163/74 (02/06 0700) Weight:  [69.2 kg] 69.2 kg (02/06 0600) Last BM Date: 04/17/20  Weight change: Filed Weights   04/16/20 0452 04/17/20 0600 04/18/20 0600  Weight: 70 kg 67 kg 69.2 kg    Intake/Output:   Intake/Output Summary (Last 24 hours) at 04/18/2020 0741 Last data filed at 04/18/2020 0700 Gross per 24 hour  Intake 2415.59 ml  Output 950 ml  Net 1465.59 ml      Physical Exam    General: Sleeping Neck: Thick. No JVD, no thyromegaly or thyroid nodule.  Lungs: Crackles at bases.  CV: Nondisplaced PMI.  Heart regular S1/S2, no S3/S4, no murmur.  No peripheral edema.   Abdomen: Soft, nontender, no hepatosplenomegaly, no distention.  Skin: Intact without lesions or rashes.  Neurologic: Wakes up and follows commands. Extremities: No clubbing or cyanosis.  HEENT: Normal.    Telemetry   NSR 100s Personally reviewed   Labs    CBC Recent Labs    04/17/20 1606 04/17/20 1707 04/18/20 0416 04/18/20 0422  WBC 25.0*  --  21.8*  --   HGB 8.4*   < > 8.7* 11.2*  HCT 28.1*   < > 28.4* 33.0*  MCV 97.9  --  97.9  --   PLT 215  --  206  --    < > = values in this interval not displayed.   Basic Metabolic Panel Recent Labs    83/09/40 1606 04/17/20 1707 04/18/20 0416 04/18/20 0422  NA 141   < > 144 144  K 3.9   < > 3.6 3.4*  CL 100  --  103  --   CO2 30  --  30  --   GLUCOSE 330*  --  292*  --   BUN 85*  --  78*  --   CREATININE 1.10  --  0.95  --   CALCIUM 8.3*  --  8.6*  --    < > = values in this interval not displayed.   Liver Function Tests No results for input(s): AST, ALT, ALKPHOS, BILITOT, PROT, ALBUMIN in the last 72 hours. No results for input(s):  LIPASE, AMYLASE in the last 72 hours. Cardiac Enzymes No results for input(s): CKTOTAL, CKMB, CKMBINDEX, TROPONINI in the last 72 hours.  BNP: BNP (last 3 results) No results for input(s): BNP in the last 8760 hours.  ProBNP (last 3 results) No results for input(s): PROBNP in the last 8760 hours.   D-Dimer No results for input(s): DDIMER in the last 72 hours. Hemoglobin A1C No results for input(s): HGBA1C in the last 72 hours. Fasting Lipid Panel No results for input(s): CHOL, HDL, LDLCALC, TRIG, CHOLHDL, LDLDIRECT in the last 72 hours. Thyroid Function Tests No results for input(s): TSH, T4TOTAL, T3FREE, THYROIDAB in the last 72 hours.  Invalid input(s): FREET3  Other results:   Imaging    DG CHEST PORT 1 VIEW  Result Date: 04/18/2020 CLINICAL DATA:  ECMO. EXAM: PORTABLE CHEST 1 VIEW COMPARISON:  One-view chest x-ray 04/17/2020 FINDINGS: Heart is enlarged. ECMO catheter in stable position. Feeding tube courses off the inferior border of the film. Diffuse bilateral airspace opacities are again noted. Right pleural effusion evident. There is no significant interval change. IMPRESSION: Stable cardiomegaly and diffuse bilateral airspace opacities. Findings consistent with congestive heart failure or infection. Support apparatus including ECMO catheter is stable. Electronically Signed   By: Marin Roberts M.D.   On: 04/18/2020 06:54     Medications:     Scheduled Medications: . acetaZOLAMIDE  500 mg Per Tube BID  . carvedilol  25 mg Oral BID WC  . chlorhexidine  15 mL Mouth Rinse BID  . Chlorhexidine Gluconate Cloth  6 each Topical Daily  . chlorpheniramine-HYDROcodone  5 mL Per Tube Q12H  . clonazePAM  1 mg Oral QID  . cloNIDine  0.1 mg Per Tube Q8H  . dextromethorphan  30 mg Per Tube TID  . diltiazem  60 mg Per Tube Q6H  . docusate  100 mg Per Tube BID  . fiber  1 packet Per Tube BID  . free water  200 mL Per Tube Q4H  . gabapentin  900 mg Per Tube Q8H  .  insulin aspart  0-20 Units Subcutaneous Q4H  . insulin aspart  4 Units Subcutaneous Q4H  . insulin detemir  50 Units Subcutaneous BID  . ipratropium-albuterol  3 mL Nebulization QID  . lactobacillus acidophilus  2 tablet Per Tube TID  . liver oil-zinc oxide   Topical 5 X Daily  . mouth rinse  15 mL Mouth Rinse q12n4p  . melatonin  3 mg Per Tube QHS  . metoCLOPramide (REGLAN) injection  10 mg Intravenous Q8H  .  nutrition supplement (JUVEN)  1 packet Per Tube BID BM  . oxyCODONE  15 mg Per Tube Q6H  . pantoprazole sodium  40 mg Per Tube QHS  . QUEtiapine  100-300 mg Per Tube QHS  . sennosides  10 mL Per Tube QHS  . sodium chloride flush  10-40 mL Intracatheter Q12H    Infusions: . sodium chloride    . sodium chloride Stopped (04/16/20 0934)  . sodium chloride 250 mL (04/07/20 2203)  . sodium chloride Stopped (03/12/20 0131)  . albumin human 12.5 g (04/11/20 1558)  . bivalirudin (ANGIOMAX) infusion 0.5 mg/mL (Non-ACS indications) 0.07 mg/kg/hr (04/18/20 0700)  . feeding supplement (PIVOT 1.5 CAL) 70 mL/hr at 04/17/20 1800  . furosemide (LASIX) 200 mg in dextrose 5% 100 mL (2mg /mL) infusion Stopped (04/17/20 0921)  . norepinephrine (LEVOPHED) Adult infusion Stopped (04/17/20 0929)  . vancomycin Stopped (04/17/20 2107)    PRN Medications: Place/Maintain arterial line **AND** sodium chloride, sodium chloride, sodium chloride, acetaminophen (TYLENOL) oral liquid 160 mg/5 mL, albumin human, [DISCONTINUED] lidocaine **AND** albuterol, Gerhardt's butt cream, guaiFENesin, hydrALAZINE, labetalol, lip balm, midazolam, morphine, morphine CONCENTRATE, ondansetron (ZOFRAN) IV, phenol, polyethylene glycol, simethicone, sodium chloride flush   Assessment/Plan   1. Acute hypoxemic respiratory failure: Due to COVID-19 PNA with bilateral infiltrates.  Refractory hypoxemia, VV-ECMO cannulation on 02/14/2020 with improvement in oxygenation.  Developed left PTX post-subclavian CVL and had left chest tube,  the left lung is re-expanded and CT out.  He was extubated 1/2 but reintubated 1/4 with agitation and suspected aspiration.  Tracheostomy 1/6.  CTA chest 1/22 with suspected chronic PEs and ARDS. ECMO cannula repositioned 1/5. ECMO circuit changed 1/26. Extubated to HFNC on 2/5. LDH stable. Sweep at 4, have struggled with hypercarbia from significant dead space ventilation.  Restarted abx 2/5 with sepsis, Enterococcus faecalis.  Lasix gtt held yesterday with sepsis.  CXR is a bit worse with extubation and loss of PEEP.  - Continue to try to wean sweep, will allow lower pH.  - Lasix 40 mg IV x 1 today and follow response.  - On vancomycin/cefepime with sepsis.    - Patient has had remdesivir, tocilizumab. - Completed steroid taper. - Continue bivalirudin, goal PTT 65-80. He is at 66 today. Discussed dosing with PharmD personally.  - Continue to mobilize  - Coreg increased to 25 mg bid to increase fractional flow via ECMO circuit. Diltiazem 30 q6 added.  - Will need to consider for lung transplantation if no improvement.  2. 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  Clot noted on TEE in RV as well.  TTE 1/2 showed normal EF 60-65%, RV improved (mildly dilated/dysfunctional). TEE on 1/5 with moderate to severe RV dysfunction but patient was hypoxemic.  Had 1/2 dose TPA on 1/5. Echo 1/20 with mildly dilated/mildly dysfunctional RV. CTA chest 1/22 with chronic-appearing PEs in upper lobes. - Bivalirudin for goal PTT 65-80.  He is 78 today. Discussed dosing with PharmD personally. 3. Left PTX: Left chest tube, lung is re-expanded. Tube now out, stable CXR. 4. Shock: Suspect septic/distributive.  Now resolved, off NE.  5. Anemia: Hgb 8.7, transfuse < 8.    6. AKI: BUN higher in setting of sepsis, follow closely with diuresis.    7. Hyperglycemia: insulin.  8. HTN: BP stable.   - On clonidine.  - Continue Coreg 25 mg bid.   - Diltiazem 30  q6 hrs - Suspect arterial line inaccurate, following cuff for now.  9. CHB: Episode of CHB when hypoxemic and with cough (suspect vagal).  NSR since then.    - Continue Coreg and diltiazem, watch rhythm.  10. Thrombocytopenia: Resolved  11. Ileus: Resolved. TFs restarted.  - Getting Reglan  - Cor-trak repositioned to post-pyloric placement 12. Ischemic digits: LUE.  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  Repeat study 1/17 showed no obstruction.  - Wound Care following. 13. ID: Group F Strep in sputum.  Also with Enterococcus faecalis in sputum. Completed abx for Enterococcus. Now with rising WBCs again and higher fever curve, Enterococcus faecalis in blood now. - He is on vancomycin/cefepime, narrow to vancomycin.    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   15. Hypernatremia: Continue free water.   CRITICAL CARE Performed by: Marca Ancona  Total critical care time: 40 minutes  Critical care time was exclusive of separately billable procedures and treating other patients.  Critical care was necessary to treat or prevent imminent or life-threatening deterioration.  Critical care was time spent personally by me on the following activities: development of treatment plan with patient and/or surrogate as well as nursing, discussions with consultants, evaluation of patient's response to treatment, examination of patient, obtaining history from patient or surrogate, ordering and performing treatments and interventions, ordering and review of laboratory studies, ordering and review of radiographic studies, pulse oximetry and re-evaluation of patient's condition.    Length of Stay: 36  Marca Ancona, MD  04/18/2020, 7:41 AM  Advanced Heart Failure Team Pager [phone] (M-F; 7a - 4p)  Please contact [facility] for night-coverage after hours (4p -7a ) and weekends on  [email or web address]

## 2020-04-18 NOTE — Procedures (Signed)
Extracorporeal support note  ECLS cannulation date: 03/05/2020 Last circuit change: 04/07/2020  Indication: Acute hypoxic respiratory failure due to ARDS from COVID-19 pneumonia with RV dysfunction.   Configuration: Venovenous  Drainage cannula: 32 French crescent cannula via right IJ Return cannula: Same  Pump speed: 3450 RPM Pump flow: 4.56 L/min Pump used: Cardio help  Oxygenator: Cardio help O2 blender: 100% Sweep gas: 4L  Circuit check: minimal clots in connectors Anticoagulant: Bivalirudin Anticoagulation targets: PTT 60-80  Changes in support:  Now bacteremic  Anticipated goals/duration of support: Bridge to recovery.  Multidisciplinary ECMO rounds completed.  Myrla Halsted MD PCCM 04/18/2020

## 2020-04-18 NOTE — Progress Notes (Addendum)
ECMO PROGRESS NOTE  NAME:  Alex [name], MRN:  [id number], DOB:  1970/09/03, LOS: 40 ADMISSION DATE:  03/08/2020, CONSULTATION DATE: 03/31/2020 REFERRING MD: Wynona Neat -LBPCCM, CHIEF COMPLAINT: Respiratory failure requiring ECMO  HPI/course in hospital  50 year old man admitted to hospital 12/28 with 1 week history of dyspnea cough nausea and vomiting.  Initially admitted to [facility] long hospital and placed on high flow nasal cannula but rapidly failed and required intubation 12/29.  Persistent hypoxic respiratory failure with PF ratio 55 in spite of 18 of PEEP FiO2 0.1 despite paralytics.  Did not improve with prone ventilation  Cannulated for VV ECMO 12/30 via right IJ crescent cannula.  ECMO circuit was changed on 04/07/2020 Iatrogenic pneumothorax from left subclavian triple-lumen placement  12/28 admitted covid, ARDS 12/30 ECMO cannulation, iatrogenic pneumothrorax, DVT, Bivalrudin started           Left subclavian hematoma, chest tube, ceftriaxone/ azithromycin started 12/31 1Unit PRBC 1/1 Palliative consult, noted HTN, fentanyl only 1/2 Extubation I/O positive, left lung re-expanded, EF 60-65%, Lasix 20mg  BID 1/3 BiPAP in place, HTN to 190s, 200s, labetolol for BP, I/O even, cefepime and vancomycin started 1/4 agitation off BiPAP, possible aspiration 1/5 agitation overnight, reintubated      ECHO with RV dilation, ECMO cannula repositioned, LUE DVT (+), 1/2 dose tPA given,      I/O up, on lasix, Epi, NE, vasopressin started, HR/ rhythm issues noted 1/6 Tracheostomy, hypoglycemic when off tube feeds       Cortrack tune issues 1/7 on lasix gtt 4mg /hr with diuresis, agitation improved 1/8 starting steroid taper 1/9 severe agitation, heavy sedation required, lasix gtt 6mg /hr, I/Os (-)       precedex and fentanyl 1/10 Vanc stopped 1/11 sweep to 2, lasix gtt at 4mg /hr, weight up, metoprolol added for HTN 1/12 fevers to 99 noted, lasix gtt and metazolone 1/13 lasix gtt dced 1/14  intermittent HTN, possibly flash pulmonary edema tied to sedation        Ischemic left hand changes, 1 Unit PRBCs 1/15 1/16 sweep from 4 to 2.5, chest tube removed 1/17 sweep at 6, trial of nebulized morphine for cough, LUE dopplers show no obstruction 1/18 two events of 2nd degree type II HB noted with coughing         sweep at 4, IV lasix 40, acetazolamide, distal XLT trach placement         agitation and BP spikes 1/19 agitation, air hunger, seroquel, klonopin, oxy, valproate, ketamine trial 1/20 sweep at 3.5, diuresis results in chugging, albumin given         1 Unit PRBCs, lidocaine nebulizers for cough, ancef started 1/21 sweep to 8, anxiety, I/Os (+) with lasix and acetazolamide 1/22 sweep at 5, chest CT bilateral upper lobe PEs 1/23 sweep at 5, delirium, I/Os even with lasix and acetazolamide         continued coughing, 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 1/24 sweep at 7.5, 1 Unit PRBCs, repeat bronchoscopy showed resolution of 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 1/25 sweep at 7.5, on propofol, lasix gtt at 53ml/hr, I/Os (+)         nebulized morphine and lidocaine for cough 1/26 sweep at 8, propofol/ precedex, cough present when weaned, lasix gtt at 72ml/hr        ECMO circuit changes, levophed started, 1 Unit PRBCs 1/27 sweep at 4, off sedation, delirium, I/Os even        lasix gtt restated, acetazolamide overnight 1/28 sweep at 7.5, HTN  labile 1/29 sweep of 6, patient reports a tickle in throat, cetacaine 1/30 sweep down to 6, cough improved with cetacaine and gabapentin 1/31 sweep at 5, agitation, off acetazolamide, lasix, weight up 2/1 cough, of pulmicort, cetacaine for cough, continued gabapentin, dexmethorphan       CT demonstrated trach placement against back of trachea 2/2 Trach collar trial somewhat effective in managing cough       low dose diltiazem for HR,  2/3 sweep at 4,trach cannula replacement (longer, more flexible bivona); brochoscopy shows  irritated mucosa       IV lasix and acetazolamide 2/4 sweep at 5, I/Os (+), BUN elevated, lasix gtt with diamox,       trach decannulation, increased gabapentin for cough 2/5 start HCAP coverage for rising WBC/temps, check Pct/cultures, CXR worse with lack of PEEP, diuresed well but started getting very hypotensive, sepsis vs. Too dry 04/18/20 blood cx grew E faecalis   Past Medical History  none  Interim history/subjective:  No events. Remains on ECMO.  Objective   Blood pressure 115/80, pulse (!) 115, temperature 97.6 F (36.4 C), temperature source Axillary, resp. rate (!) 44, height 5\' 4"  (1.626 m), weight 69.2 kg, SpO2 95 %.        Intake/Output Summary (Last 24 hours) at 04/18/2020 0701 Last data filed at 04/18/2020 0700 Gross per 24 hour  Intake 2415.59 ml  Output 950 ml  Net 1465.59 ml   Filed Weights   04/16/20 0452 04/17/20 0600 04/18/20 0600  Weight: 70 kg 67 kg 69.2 kg    Examination: Constitutional: ill appearing man on HFNC  Eyes: EOMI, pupils equal, mild chemosis Ears, nose, mouth, and throat: trach site dressed, MMM Cardiovascular: Tachycardic, ext warm Respiratory: rapid shallow inspiratory efforts, occasional accessory muscle use Gastrointestinal: soft, NGT in place Skin: No rashes, normal turgor, stable L hand ischemic changes Neurologic: moves all 4 ext to command Psychiatric: flat affect, more somnolent, RASS -1     Net +1.4 L K low, repleting ABG showing retention Cr slightly up CBG up WBC improved H/H stable Plts stable LDH improved Temps improved with abx CXR contninued severe bilateral infiltrates CT chest 2/1: 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, no frank fibrosis, ongoing airspace disease, small R effusion; resident re examined R chest 06/16/20 2/4 and minimal effusion  Assessment & Plan:  Acute hypoxemic/hypercapnic respiratory failure due to severe ARDS from COVID-19 pneumonia Probable acute PE, and RV dysfunction s/p  TPA Refractory coughing- improved after trach removal but still an issue Strep group F/enterococcal pneumonia- s/p 10 days vanc 1/29 - Continue VV ECMO support, sweep weans as able - Current cough regimen:  dextromethorphan, gabapentin - Continue lung protective ventilation - Today, goal even with diuresis, sweep pushes  HTN - Trials of reducing HR/BP have resulted in reduced pCO2 (likely related to fractional flow through ECMO circuit) - continue increased coreg, increase diltiazem; had to hold 2/5 due to hypotension  Acute delirium, with agitation- improved -  RASS goal -1 to 0, PRN, morphine PRN, oxycodone 15mg  q6h, qHS seroquel - Change kloninpin to 1mg  qid - Today: most things are on hold since he is so somnolent, question effect of sepsis  Sepsis secondary to E faecalis bacteremia- secondary to PNA, has grown enterococcus in sputum prior, previous vanc course completed 1/29 - Continue vanc, dc cefepime - ID to see and give further recommendations - TTE  Gastroparesis with some element ileus: improved but occasional vomiting spells with coughing attacks - Continue reglan, strengthened bowel  regimen (miralax, docusate, fiberpack), QTc okay 1/31   Ischemic changes L hand - wound following, appreciate dressing recommendations - surgical consult for L hand once more stable  Poorly controlled diabetes type II -Continue levemir, SSI; levemir was not given yesterday morning due to hypoglycemia, will reduce to 35 units BID  Acute urinary retention - On condom cath trial - Hold on bethenacol (off since 1/30)  Hypernatremia, fluid balance- goal even with balanced diuresis   Daily Goals Checklist  Pain/Anxiety/Delirium protocol (if indicated): see above VAP protocol (if indicated): bundle in place.  DVT prophylaxis: bivalirudin GI prophylaxis: pantoprazole Glucose control: Euglcyemic on combination of SSI and Basal insulin. Mobility/therapy needs: Mobilization as tolerated,  appreciate PT help Code Status: Full code Disposition: ICU.    Patient critically ill due to COVID ARDS Interventions to address this today vent wean, ECMO wean Risk of deterioration without these interventions is high  I personally spent 38 minutes providing critical care not including any separately billable procedures  Myrla Halsted MD [facility] 04/18/2020 7:01 AM Prefer epic messenger for cross cover needs If after hours, please call E-link

## 2020-04-18 NOTE — Plan of Care (Signed)
  Problem: Health Behavior/Discharge Planning: Goal: Ability to manage health-related needs will improve Outcome: Progressing   Problem: Clinical Measurements: Goal: Ability to maintain clinical measurements within normal limits will improve Outcome: Progressing Goal: Will remain free from infection Outcome: Progressing Goal: Diagnostic test results will improve Outcome: Progressing Goal: Respiratory complications will improve Outcome: Progressing Goal: Cardiovascular complication will be avoided Outcome: Progressing   Problem: Activity: Goal: Risk for activity intolerance will decrease Outcome: Progressing   Problem: Pain Managment: Goal: General experience of comfort will improve Outcome: Progressing   Problem: Safety: Goal: Ability to remain free from injury will improve Outcome: Progressing   Problem: Respiratory: Goal: Will maintain a patent airway Outcome: Progressing Goal: Complications related to the disease process, condition or treatment will be avoided or minimized Outcome: Progressing

## 2020-04-18 NOTE — Progress Notes (Signed)
ANTICOAGULATION CONSULT NOTE  Pharmacy Consult for bivalirudin Indication: ECMO and VTE  Labs: Recent Labs    04/16/20 0353 04/16/20 0355 04/17/20 0435 04/17/20 1157 04/17/20 1606 04/17/20 1707 04/17/20 2208 04/18/20 0416 04/18/20 0422  HGB 7.7*   < > 9.9*   < > 8.4*   < > 9.2* 8.7* 11.2*  HCT 26.0*   < > 33.0*   < > 28.1*   < > 27.0* 28.4* 33.0*  PLT 239   < > 262  --  215  --   --  206  --   APTT 68*   < > 67*  --  89*  --   --  76*  --   LABPROT 18.2*  --  16.9*  --   --   --   --  19.3*  --   INR 1.6*  --  1.4*  --   --   --   --  1.7*  --   CREATININE 0.79   < > 0.73  --  1.10  --   --  0.95  --    < > = values in this interval not displayed.    Assessment: 36 yoM admitted with COVID-19 PNA with worsening hypoxia, s/p cannulation for ECMO. Pt was started on IV heparin prior to cannulation due to acute DVTs and possible PE, transitioned to bivalirudin with ECMO. Now s/p tPA on 1/5 and tracheostomy on 1/6. Circuit last changed 1/26. -APTT= 76, hg= 11.2   Goal of Therapy:  aPTT 60-80 seconds   Plan:  -Continue bivalirudin at0.07 mg/kg/hr (using order-specific wt 72.1kg) -Monitor q12h aPTT/CBC, LDH, and for s/sx of bleeding  Harland German, PharmD Clinical Pharmacist **Pharmacist phone directory can now be found on [email or web address] ([email or web address]).  Listed under [facility].

## 2020-04-18 NOTE — Consult Note (Signed)
[facility]    Date of Admission:  02/24/2020   Total days of antibiotics: 1 vanco/cefepime --> vanco               Reason for Consult: Enterococcal bacteremia    Referring Provider: CHAMP!   Assessment: Enterococcal bacteremia Sepsis ARDS from COVID ECMO Multiple lines  Plan: 1. Change to high dose ampicillin when sensi available 2. Repeat BCx 3. Consider TTE/TEE 4. Change lines as able [facility], circuit?)  Comment Will defer to CHF and CCM teams on ability to change pt's lines.   Thank you so much for this interesting consult,  Active Problems:   COVID-19   Acute respiratory distress syndrome (ARDS) due to COVID-19 virus (HCC)   Acute respiratory failure with hypoxia (HCC)   Respiratory failure (HCC)   Pressure injury of skin   . acetaZOLAMIDE  500 mg Per Tube BID  . carvedilol  25 mg Oral BID WC  . chlorhexidine  15 mL Mouth Rinse BID  . Chlorhexidine Gluconate Cloth  6 each Topical Daily  . chlorpheniramine-HYDROcodone  5 mL Per Tube Q12H  . clonazePAM  1 mg Oral QID  . cloNIDine  0.1 mg Per Tube Q8H  . dextromethorphan  30 mg Per Tube TID  . diltiazem  60 mg Per Tube Q6H  . docusate  100 mg Per Tube BID  . fiber  1 packet Per Tube BID  . free water  200 mL Per Tube Q4H  . gabapentin  900 mg Per Tube Q8H  . insulin aspart  0-20 Units Subcutaneous Q4H  . insulin aspart  4 Units Subcutaneous Q4H  . insulin detemir  35 Units Subcutaneous BID  . ipratropium-albuterol  3 mL Nebulization QID  . lactobacillus acidophilus  2 tablet Per Tube TID  . liver oil-zinc oxide   Topical 5 X Daily  . mouth rinse  15 mL Mouth Rinse q12n4p  . melatonin  3 mg Per Tube QHS  . metoCLOPramide (REGLAN) injection  10 mg Intravenous Q8H  . nutrition supplement (JUVEN)  1 packet Per Tube BID BM  . oxyCODONE  15 mg Per Tube Q6H  . pantoprazole sodium  40 mg Per Tube QHS  . QUEtiapine  100-300 mg Per Tube QHS  . sennosides  10 mL Per Tube QHS   . sodium chloride flush  10-40 mL Intracatheter Q12H    HPI: Alex [name] is a 50 y.o. male with hx of COVID+ 03-08-20 and adm to hospital on 02/18/2020.  He required intubation 12-29 and ECMO 12-30.  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 Bilateral PE (1-22).  His BAL (1-22) grew Group F strep and E faecalis (pan-sens) and C parapsilosis.  His ECMO circuit was last changed 04-07-20.  By 2-5 he was noted to have rising WBC (11 --> 17--> 25), hypotension, and temp (99.5). He had BCx showing (E faecalis, non-VRE) in 3/4 bottles. He was started on vanco/cefepime.  Review of Systems: Review of Systems  Unable to perform ROS: Critical illness  occas awake per nursing.   History reviewed. No pertinent past medical history.  Social History   Tobacco Use  . Smoking status: Never Smoker  . Smokeless tobacco: Never Used  Substance Use Topics  . Alcohol use: Yes    Comment: occasionally  . Drug use: Never    Family History  Problem Relation Age of Onset  . Heart disease  Father   . Heart disease Mother   . Breast cancer Mother      Medications:  Scheduled: . acetaZOLAMIDE  500 mg Per Tube BID  . carvedilol  25 mg Oral BID WC  . chlorhexidine  15 mL Mouth Rinse BID  . Chlorhexidine Gluconate Cloth  6 each Topical Daily  . chlorpheniramine-HYDROcodone  5 mL Per Tube Q12H  . clonazePAM  1 mg Oral QID  . cloNIDine  0.1 mg Per Tube Q8H  . dextromethorphan  30 mg Per Tube TID  . diltiazem  60 mg Per Tube Q6H  . docusate  100 mg Per Tube BID  . fiber  1 packet Per Tube BID  . free water  200 mL Per Tube Q4H  . gabapentin  900 mg Per Tube Q8H  . insulin aspart  0-20 Units Subcutaneous Q4H  . insulin aspart  4 Units Subcutaneous Q4H  . insulin detemir  35 Units Subcutaneous BID  . ipratropium-albuterol  3 mL Nebulization QID  . lactobacillus acidophilus  2 tablet Per Tube TID  . liver oil-zinc oxide   Topical 5 X Daily  . mouth  rinse  15 mL Mouth Rinse q12n4p  . melatonin  3 mg Per Tube QHS  . metoCLOPramide (REGLAN) injection  10 mg Intravenous Q8H  . nutrition supplement (JUVEN)  1 packet Per Tube BID BM  . oxyCODONE  15 mg Per Tube Q6H  . pantoprazole sodium  40 mg Per Tube QHS  . QUEtiapine  100-300 mg Per Tube QHS  . sennosides  10 mL Per Tube QHS  . sodium chloride flush  10-40 mL Intracatheter Q12H    Abtx:  Anti-infectives (From admission, onward)   Start     Dose/Rate Route Frequency Ordered Stop   04/17/20 2030  vancomycin (VANCOCIN) IVPB 1000 mg/200 mL premix        1,000 mg 200 mL/hr over 60 Minutes Intravenous Every 12 hours 04/17/20 0723     04/17/20 0830  ceFEPIme (MAXIPIME) 2 g in sodium chloride 0.9 % 100 mL IVPB  Status:  Discontinued        2 g 200 mL/hr over 30 Minutes Intravenous Every 8 hours 04/17/20 0723 04/18/20 0712   04/17/20 0815  vancomycin (VANCOREADY) IVPB 1250 mg/250 mL        1,250 mg 166.7 mL/hr over 90 Minutes Intravenous  Once 04/17/20 0723 04/17/20 1017   04/06/20 1200  vancomycin (VANCOREADY) IVPB 1250 mg/250 mL        1,250 mg 166.7 mL/hr over 90 Minutes Intravenous Every 24 hours 04/06/20 1106 04/10/20 1346   04/03/20 2100  vancomycin (VANCOREADY) IVPB 750 mg/150 mL  Status:  Discontinued        750 mg 150 mL/hr over 60 Minutes Intravenous Every 12 hours 04/03/20 1200 04/06/20 1106   04/03/20 0845  vancomycin (VANCOREADY) IVPB 1750 mg/350 mL        1,750 mg 175 mL/hr over 120 Minutes Intravenous  Once 04/03/20 0757 04/03/20 1122   04/01/20 0830  ceFAZolin (ANCEF) IVPB 2g/100 mL premix  Status:  Discontinued        2 g 200 mL/hr over 30 Minutes Intravenous Every 8 hours 04/01/20 0736 04/03/20 0757   03/18/20 1700  vancomycin (VANCOCIN) IVPB 1000 mg/200 mL premix  Status:  Discontinued        1,000 mg 200 mL/hr over 60 Minutes Intravenous Every 12 hours 03/18/20 1655 03/22/20 0935   03/18/20 0735  vancomycin variable dose per unstable  renal function (pharmacist  dosing)  Status:  Discontinued         Does not apply See admin instructions 03/18/20 0740 03/18/20 1655   03/16/20 0400  vancomycin (VANCOCIN) IVPB 1000 mg/200 mL premix  Status:  Discontinued        1,000 mg 200 mL/hr over 60 Minutes Intravenous Every 12 hours 03/15/20 1222 03/18/20 0740   03/15/20 1700  ceFEPIme (MAXIPIME) 2 g in sodium chloride 0.9 % 100 mL IVPB        2 g 200 mL/hr over 30 Minutes Intravenous Every 8 hours 03/15/20 1532 03/24/20 1854   03/15/20 0830  ceFEPIme (MAXIPIME) 2 g in sodium chloride 0.9 % 100 mL IVPB  Status:  Discontinued        2 g 200 mL/hr over 30 Minutes Intravenous Every 8 hours 03/15/20 0733 03/15/20 1532   03/15/20 0830  vancomycin (VANCOREADY) IVPB 2000 mg/400 mL        2,000 mg 200 mL/hr over 120 Minutes Intravenous  Once 03/15/20 0733 03/15/20 0851   03/10/20 1100  azithromycin (ZITHROMAX) 500 mg in sodium chloride 0.9 % 250 mL IVPB        500 mg 250 mL/hr over 60 Minutes Intravenous Every 24 hours 03/10/20 0746 03/13/20 1137   03/10/20 1000  remdesivir 100 mg in sodium chloride 0.9 % 100 mL IVPB  Status:  Discontinued       "Followed by" Linked Group Details   100 mg 200 mL/hr over 30 Minutes Intravenous Daily 02/15/2020 1448 02/12/2020 1453   03/10/20 1000  remdesivir 100 mg in sodium chloride 0.9 % 100 mL IVPB        100 mg 200 mL/hr over 30 Minutes Intravenous Daily 02/22/2020 1244 03/13/20 0918   03/10/20 1000  cefTRIAXone (ROCEPHIN) 1 g in sodium chloride 0.9 % 100 mL IVPB        1 g 200 mL/hr over 30 Minutes Intravenous Every 24 hours 03/10/20 0746 03/13/20 1028   02/18/2020 1400  remdesivir 200 mg in sodium chloride 0.9% 250 mL IVPB        200 mg 580 mL/hr over 30 Minutes Intravenous Once 03/08/2020 1244 03/12/2020 1405   02/27/2020 1245  remdesivir 200 mg in sodium chloride 0.9% 250 mL IVPB  Status:  Discontinued       "Followed by" Linked Group Details   200 mg 580 mL/hr over 30 Minutes Intravenous Once 03/07/2020 1448 02/14/2020 1453   02/19/2020  1015  cefTRIAXone (ROCEPHIN) 1 g in sodium chloride 0.9 % 100 mL IVPB        1 g 200 mL/hr over 30 Minutes Intravenous  Once 03/08/2020 1012 03/04/2020 1054   03/07/2020 1015  azithromycin (ZITHROMAX) 500 mg in sodium chloride 0.9 % 250 mL IVPB        500 mg 250 mL/hr over 60 Minutes Intravenous  Once 02/29/2020 1012 02/28/2020 1200        OBJECTIVE: Blood pressure 112/65, pulse (!) 101, temperature 98.4 F (36.9 C), temperature source Core (Comment), resp. rate (!) 34, height _0  (1.626 m), weight 69.2 kg, SpO2 100 %.  Physical Exam Vitals reviewed.  Constitutional:      Appearance: He is ill-appearing and toxic-appearing.  Neck:     Comments: R neck ecmo canula.  Cardiovascular:     Rate and Rhythm: Tachycardia present. Rhythm irregular.  Pulmonary:     Breath sounds: Rhonchi present.     Comments: tachypnea Abdominal:     General: There is  distension.     Palpations: Abdomen is soft.  Musculoskeletal:     Right lower leg: Edema present.     Left lower leg: Edema present.     Comments: Infarcts/emboli to L digits. RUE PIC clean.  anasarca     Lab Results Results for orders placed or performed during the hospital encounter of 02/13/2020 (from the past 48 hour(s))  Glucose, capillary     Status: Abnormal   Collection Time: 04/16/20 11:09 AM  Result Value Ref Range   Glucose-Capillary 111 (H) 70 - 99 mg/dL    Comment: Glucose reference range applies only to samples taken after fasting for at least 8 hours.  I-STAT 7, (LYTES, BLD GAS, ICA, H+H)     Status: Abnormal   Collection Time: 04/16/20 11:12 AM  Result Value Ref Range   pH, Arterial 7.243 (L) 7.350 - 7.450   pCO2 arterial 75.1 (HH) 32.0 - 48.0 mmHg   pO2, Arterial 159 (H) 83.0 - 108.0 mmHg   Bicarbonate 32.4 (H) 20.0 - 28.0 mmol/L   TCO2 35 (H) 22 - 32 mmol/L   O2 Saturation 99.0 %   Acid-Base Excess 3.0 (H) 0.0 - 2.0 mmol/L   Sodium 146 (H) 135 - 145 mmol/L   Potassium 5.6 (H) 3.5 - 5.1 mmol/L   Calcium, Ion 1.32  1.15 - 1.40 mmol/L   HCT 31.0 (L) 39.0 - 52.0 %   Hemoglobin 10.5 (L) 13.0 - 17.0 g/dL   Patient temperature 36.9 C    Sample type ARTERIAL    Comment NOTIFIED PHYSICIAN   I-STAT 7, (LYTES, BLD GAS, ICA, H+H)     Status: Abnormal   Collection Time: 04/16/20  1:20 PM  Result Value Ref Range   pH, Arterial 7.274 (L) 7.350 - 7.450   pCO2 arterial 74.5 (HH) 32.0 - 48.0 mmHg   pO2, Arterial 171 (H) 83.0 - 108.0 mmHg   Bicarbonate 34.6 (H) 20.0 - 28.0 mmol/L   TCO2 37 (H) 22 - 32 mmol/L   O2 Saturation 99.0 %   Acid-Base Excess 6.0 (H) 0.0 - 2.0 mmol/L   Sodium 147 (H) 135 - 145 mmol/L   Potassium 4.4 3.5 - 5.1 mmol/L   Calcium, Ion 1.32 1.15 - 1.40 mmol/L   HCT 30.0 (L) 39.0 - 52.0 %   Hemoglobin 10.2 (L) 13.0 - 17.0 g/dL   Patient temperature 36.8 C    Sample type ARTERIAL    Comment NOTIFIED PHYSICIAN   APTT     Status: Abnormal   Collection Time: 04/16/20  4:11 PM  Result Value Ref Range   aPTT 73 (H) 24 - 36 seconds    Comment:        IF BASELINE aPTT IS ELEVATED, SUGGEST PATIENT RISK ASSESSMENT BE USED TO DETERMINE APPROPRIATE ANTICOAGULANT THERAPY. Performed at [facility], [place] [address], [place], [place] [address]   Lactic acid, plasma     Status: None   Collection Time: 04/16/20  4:11 PM  Result Value Ref Range   Lactic Acid, Venous 0.5 0.5 - 1.9 mmol/L    Comment: Performed at [facility] [address], [place], [place] [address]  CBC     Status: Abnormal   Collection Time: 04/16/20  4:11 PM  Result Value Ref Range   WBC 17.2 (H) 4.0 - 10.5 K/uL   RBC 3.52 (L) 4.22 - 5.81 MIL/uL    Comment: REPEATED TO VERIFY   Hemoglobin 10.6 (L) 13.0 - 17.0 g/dL  Comment: REPEATED TO VERIFY POST TRANSFUSION SPECIMEN    HCT 34.3 (L) 39.0 - 52.0 %    Comment: REPEATED TO VERIFY   MCV 97.4 80.0 - 100.0 fL    Comment: POST TRANSFUSION SPECIMEN REPEATED TO VERIFY    MCH 30.1 26.0 - 34.0 pg   MCHC 30.9 30.0 - 36.0 g/dL   RDW 18.6 (H) 11.5 - 15.5 %    Platelets 306 150 - 400 K/uL   nRBC 0.4 (H) 0.0 - 0.2 %    Comment: Performed at [facility] [address] [place], [place] [address]  Basic metabolic panel     Status: Abnormal   Collection Time: 04/16/20  4:11 PM  Result Value Ref Range   Sodium 147 (H) 135 - 145 mmol/L   Potassium 4.5 3.5 - 5.1 mmol/L   Chloride 104 98 - 111 mmol/L   CO2 32 22 - 32 mmol/L   Glucose, Bld 123 (H) 70 - 99 mg/dL    Comment: Glucose reference range applies only to samples taken after fasting for at least 8 hours.   BUN 67 (H) 6 - 20 mg/dL   Creatinine, Ser 0.77 0.61 - 1.24 mg/dL   Calcium 9.3 8.9 - 10.3 mg/dL   GFR, Estimated >60 >60 mL/min    Comment: (NOTE) Calculated using the CKD-EPI Creatinine Equation (2021)    Anion gap 11 5 - 15    Comment: Performed at [facility] [address], [place], [place] [address]  Glucose, capillary     Status: Abnormal   Collection Time: 04/16/20  4:11 PM  Result Value Ref Range   Glucose-Capillary 120 (H) 70 - 99 mg/dL    Comment: Glucose reference range applies only to samples taken after fasting for at least 8 hours.  I-STAT 7, (LYTES, BLD GAS, ICA, H+H)     Status: Abnormal   Collection Time: 04/16/20  4:14 PM  Result Value Ref Range   pH, Arterial 7.238 (L) 7.350 - 7.450   pCO2 arterial 84.8 (HH) 32.0 - 48.0 mmHg   pO2, Arterial 66 (L) 83.0 - 108.0 mmHg   Bicarbonate 36.3 (H) 20.0 - 28.0 mmol/L   TCO2 39 (H) 22 - 32 mmol/L   O2 Saturation 87.0 %   Acid-Base Excess 6.0 (H) 0.0 - 2.0 mmol/L   Sodium 145 135 - 145 mmol/L   Potassium 4.5 3.5 - 5.1 mmol/L   Calcium, Ion 1.33 1.15 - 1.40 mmol/L   HCT 33.0 (L) 39.0 - 52.0 %   Hemoglobin 11.2 (L) 13.0 - 17.0 g/dL   Patient temperature 36.9 C    Sample type ARTERIAL    Comment NOTIFIED PHYSICIAN   I-STAT 7, (LYTES, BLD GAS, ICA, H+H)     Status: Abnormal   Collection Time: 04/16/20  5:21 PM  Result Value Ref Range   pH, Arterial 7.319 (L) 7.350 - 7.450   pCO2 arterial 67.6 (HH) 32.0 -  48.0 mmHg   pO2, Arterial 67 (L) 83.0 - 108.0 mmHg   Bicarbonate 34.8 (H) 20.0 - 28.0 mmol/L   TCO2 37 (H) 22 - 32 mmol/L   O2 Saturation 90.0 %   Acid-Base Excess 7.0 (H) 0.0 - 2.0 mmol/L   Sodium 146 (H) 135 - 145 mmol/L   Potassium 4.4 3.5 - 5.1 mmol/L   Calcium, Ion 1.26 1.15 - 1.40 mmol/L   HCT 31.0 (L) 39.0 - 52.0 %   Hemoglobin 10.5 (L) 13.0 - 17.0 g/dL   Patient temperature  37.0 C    Sample type ARTERIAL    Comment NOTIFIED PHYSICIAN   I-STAT 7, (LYTES, BLD GAS, ICA, H+H)     Status: Abnormal   Collection Time: 04/16/20  6:35 PM  Result Value Ref Range   pH, Arterial 7.340 (L) 7.350 - 7.450   pCO2 arterial 66.0 (HH) 32.0 - 48.0 mmHg   pO2, Arterial 65 (L) 83.0 - 108.0 mmHg   Bicarbonate 35.6 (H) 20.0 - 28.0 mmol/L   TCO2 38 (H) 22 - 32 mmol/L   O2 Saturation 90.0 %   Acid-Base Excess 8.0 (H) 0.0 - 2.0 mmol/L   Sodium 145 135 - 145 mmol/L   Potassium 4.1 3.5 - 5.1 mmol/L   Calcium, Ion 1.26 1.15 - 1.40 mmol/L   HCT 31.0 (L) 39.0 - 52.0 %   Hemoglobin 10.5 (L) 13.0 - 17.0 g/dL   Collection site Magazine features editor by Nurse    Sample type ARTERIAL    Comment VALUES EXPECTED, NO REPEAT   Glucose, capillary     Status: Abnormal   Collection Time: 04/16/20  7:57 PM  Result Value Ref Range   Glucose-Capillary 104 (H) 70 - 99 mg/dL    Comment: Glucose reference range applies only to samples taken after fasting for at least 8 hours.  I-STAT 7, (LYTES, BLD GAS, ICA, H+H)     Status: Abnormal   Collection Time: 04/16/20  8:01 PM  Result Value Ref Range   pH, Arterial 7.364 7.350 - 7.450   pCO2 arterial 62.9 (H) 32.0 - 48.0 mmHg   pO2, Arterial 61 (L) 83.0 - 108.0 mmHg   Bicarbonate 35.9 (H) 20.0 - 28.0 mmol/L   TCO2 38 (H) 22 - 32 mmol/L   O2 Saturation 89.0 %   Acid-Base Excess 9.0 (H) 0.0 - 2.0 mmol/L   Sodium 146 (H) 135 - 145 mmol/L   Potassium 3.9 3.5 - 5.1 mmol/L   Calcium, Ion 1.27 1.15 - 1.40 mmol/L   HCT 31.0 (L) 39.0 - 52.0 %   Hemoglobin 10.5 (L) 13.0 - 17.0  g/dL   Patient temperature 98.8 F    Collection site Magazine features editor by RT    Sample type ARTERIAL   Glucose, capillary     Status: Abnormal   Collection Time: 04/17/20 12:03 AM  Result Value Ref Range   Glucose-Capillary 124 (H) 70 - 99 mg/dL    Comment: Glucose reference range applies only to samples taken after fasting for at least 8 hours.  I-STAT 7, (LYTES, BLD GAS, ICA, H+H)     Status: Abnormal   Collection Time: 04/17/20 12:06 AM  Result Value Ref Range   pH, Arterial 7.378 7.350 - 7.450   pCO2 arterial 61.1 (H) 32.0 - 48.0 mmHg   pO2, Arterial 69 (L) 83.0 - 108.0 mmHg   Bicarbonate 35.8 (H) 20.0 - 28.0 mmol/L   TCO2 38 (H) 22 - 32 mmol/L   O2 Saturation 92.0 %   Acid-Base Excess 9.0 (H) 0.0 - 2.0 mmol/L   Sodium 144 135 - 145 mmol/L   Potassium 3.2 (L) 3.5 - 5.1 mmol/L   Calcium, Ion 1.24 1.15 - 1.40 mmol/L   HCT 29.0 (L) 39.0 - 52.0 %   Hemoglobin 9.9 (L) 13.0 - 17.0 g/dL   Patient temperature 99.4 F    Collection site Magazine features editor by RT    Sample type ARTERIAL   Glucose, capillary     Status:  Abnormal   Collection Time: 04/17/20  4:11 AM  Result Value Ref Range   Glucose-Capillary 108 (H) 70 - 99 mg/dL    Comment: Glucose reference range applies only to samples taken after fasting for at least 8 hours.  I-STAT 7, (LYTES, BLD GAS, ICA, H+H)     Status: Abnormal   Collection Time: 04/17/20  4:14 AM  Result Value Ref Range   pH, Arterial 7.380 7.350 - 7.450   pCO2 arterial 59.5 (H) 32.0 - 48.0 mmHg   pO2, Arterial 63 (L) 83.0 - 108.0 mmHg   Bicarbonate 35.0 (H) 20.0 - 28.0 mmol/L   TCO2 37 (H) 22 - 32 mmol/L   O2 Saturation 90.0 %   Acid-Base Excess 9.0 (H) 0.0 - 2.0 mmol/L   Sodium 146 (H) 135 - 145 mmol/L   Potassium 2.9 (L) 3.5 - 5.1 mmol/L   Calcium, Ion 1.18 1.15 - 1.40 mmol/L   HCT 29.0 (L) 39.0 - 52.0 %   Hemoglobin 9.9 (L) 13.0 - 17.0 g/dL   Patient temperature 99.5 F    Collection site Magazine features editor by RT    Sample type ARTERIAL   Type  and screen [place]     Status: None (Preliminary result)   Collection Time: 04/17/20  4:35 AM  Result Value Ref Range   ABO/RH(D) B POS    Antibody Screen NEG    Sample Expiration 04/20/2020,2359    Unit Number [id number]    Blood Component Type RBC LR PHER1    Unit division 00    Status of Unit ALLOCATED    Transfusion Status OK TO TRANSFUSE    Crossmatch Result      Compatible Performed at [facility], [address], [place], [place] [address]    Unit Number [id number]    Blood Component Type RED CELLS,LR    Unit division 00    Status of Unit ALLOCATED    Transfusion Status OK TO TRANSFUSE    Crossmatch Result Compatible    Unit Number [id number]    Blood Component Type RED CELLS,LR    Unit division 00    Status of Unit ALLOCATED    Transfusion Status OK TO TRANSFUSE    Crossmatch Result Compatible    Unit Number [id number]    Blood Component Type RED CELLS,LR    Unit division 00    Status of Unit ALLOCATED    Transfusion Status OK TO TRANSFUSE    Crossmatch Result Compatible   Protime-INR     Status: Abnormal   Collection Time: 04/17/20  4:35 AM  Result Value Ref Range   Prothrombin Time 16.9 (H) 11.4 - 15.2 seconds   INR 1.4 (H) 0.8 - 1.2    Comment: (NOTE) INR goal varies based on device and disease states. Performed at [facility], [place] [address] [place], [place] [address]   APTT     Status: Abnormal   Collection Time: 04/17/20  4:35 AM  Result Value Ref Range   aPTT 67 (H) 24 - 36 seconds    Comment:        IF BASELINE aPTT IS ELEVATED, SUGGEST PATIENT RISK ASSESSMENT BE USED TO DETERMINE APPROPRIATE ANTICOAGULANT THERAPY. Performed at [facility], [place] [address], [place],  [address]   Lactic acid, plasma     Status: None   Collection Time: 04/17/20  4:35 AM  Result Value Ref Range   Lactic Acid, Venous 1.1  0.5 - 1.9 mmol/L    Comment: Performed at [facility], [place] [address],  [place], [place] [address]  CBC     Status: Abnormal   Collection Time: 04/17/20  4:35 AM  Result Value Ref Range   WBC 20.9 (H) 4.0 - 10.5 K/uL   RBC 3.44 (L) 4.22 - 5.81 MIL/uL   Hemoglobin 9.9 (L) 13.0 - 17.0 g/dL   HCT 33.0 (L) 39.0 - 52.0 %   MCV 95.9 80.0 - 100.0 fL   MCH 28.8 26.0 - 34.0 pg   MCHC 30.0 30.0 - 36.0 g/dL   RDW 17.9 (H) 11.5 - 15.5 %   Platelets 262 150 - 400 K/uL   nRBC 0.2 0.0 - 0.2 %    Comment: Performed at [facility], [place] [address], [place], [place] [address]  Basic metabolic panel     Status: Abnormal   Collection Time: 04/17/20  4:35 AM  Result Value Ref Range   Sodium 145 135 - 145 mmol/L   Potassium 2.9 (L) 3.5 - 5.1 mmol/L   Chloride 100 98 - 111 mmol/L   CO2 33 (H) 22 - 32 mmol/L   Glucose, Bld 119 (H) 70 - 99 mg/dL    Comment: Glucose reference range applies only to samples taken after fasting for at least 8 hours.   BUN 56 (H) 6 - 20 mg/dL   Creatinine, Ser 0.73 0.61 - 1.24 mg/dL   Calcium 9.0 8.9 - 10.3 mg/dL   GFR, Estimated >60 >60 mL/min    Comment: (NOTE) Calculated using the CKD-EPI Creatinine Equation (2021)    Anion gap 12 5 - 15    Comment: Performed at [facility] [address], [place], [place] [address]  Fibrinogen     Status: Abnormal   Collection Time: 04/17/20  4:35 AM  Result Value Ref Range   Fibrinogen 650 (H) 210 - 475 mg/dL    Comment: Performed at [facility] [address], [place],  [address]  Procalcitonin - Baseline     Status: None   Collection Time: 04/17/20  7:25 AM  Result Value Ref Range   Procalcitonin 3.07 ng/mL    Comment:        Interpretation: PCT > 2 ng/mL: Systemic infection (sepsis) is likely, unless other causes are known. (NOTE)       Sepsis PCT Algorithm           Lower Respiratory Tract                                      Infection PCT Algorithm    ----------------------------     ----------------------------         PCT < 0.25 ng/mL                PCT < 0.10  ng/mL          Strongly encourage             Strongly discourage   discontinuation of antibiotics    initiation of antibiotics    ----------------------------     -----------------------------       PCT 0.25 - 0.50 ng/mL            PCT 0.10 - 0.25 ng/mL               OR       >  80% decrease in PCT            Discourage initiation of                                            antibiotics      Encourage discontinuation           of antibiotics    ----------------------------     -----------------------------         PCT >= 0.50 ng/mL              PCT 0.26 - 0.50 ng/mL               AND       <80% decrease in PCT              Encourage initiation of                                             antibiotics       Encourage continuation           of antibiotics    ----------------------------     -----------------------------        PCT >= 0.50 ng/mL                  PCT > 0.50 ng/mL               AND         increase in PCT                  Strongly encourage                                      initiation of antibiotics    Strongly encourage escalation           of antibiotics                                     -----------------------------                                           PCT <= 0.25 ng/mL                                                 OR                                        > 80% decrease in PCT                                      Discontinue / Do not initiate  antibiotics  Performed at [facility], [place] [address] [place], [place] [address]   Lactate dehydrogenase     Status: Abnormal   Collection Time: 04/17/20  7:25 AM  Result Value Ref Range   LDH 539 (H) 98 - 192 U/L    Comment: Performed at [facility], [place] [address], [place], [place] [address]  Glucose, capillary     Status: Abnormal   Collection Time: 04/17/20  7:29 AM  Result Value Ref Range   Glucose-Capillary 69 (L) 70 - 99 mg/dL    Comment:  Glucose reference range applies only to samples taken after fasting for at least 8 hours.  Glucose, capillary     Status: Abnormal   Collection Time: 04/17/20  7:50 AM  Result Value Ref Range   Glucose-Capillary 122 (H) 70 - 99 mg/dL    Comment: Glucose reference range applies only to samples taken after fasting for at least 8 hours.  Culture, blood (routine x 2)     Status: None (Preliminary result)   Collection Time: 04/17/20  8:01 AM   Specimen: BLOOD LEFT HAND  Result Value Ref Range   Specimen Description BLOOD LEFT HAND    Special Requests      BOTTLES DRAWN AEROBIC ONLY Blood Culture adequate volume   Culture  Setup Time      GRAM POSITIVE COCCI IN CHAINS IN PAIRS AEROBIC BOTTLE ONLY CRITICAL VALUE NOTED.  VALUE IS CONSISTENT WITH PREVIOUSLY REPORTED AND CALLED VALUE. Performed at [facility], [place] [address], [place], [place] [address]    Culture GRAM POSITIVE COCCI    Report Status PENDING   Culture, blood (routine x 2)     Status: Abnormal (Preliminary result)   Collection Time: 04/17/20  8:11 AM   Specimen: BLOOD LEFT WRIST  Result Value Ref Range   Specimen Description BLOOD LEFT WRIST    Special Requests      BOTTLES DRAWN AEROBIC AND ANAEROBIC Blood Culture adequate volume   Culture  Setup Time      GRAM POSITIVE COCCI IN CHAINS IN BOTH AEROBIC AND ANAEROBIC BOTTLES CRITICAL RESULT CALLED TO, READ BACK BY AND VERIFIED WITH: Erin Hearing PHARMD _0  04/17/20 EB    Culture (A)     ENTEROCOCCUS FAECALIS SUSCEPTIBILITIES TO FOLLOW Performed at [facility], [place] [address] [place], [place] [address]    Report Status PENDING   Blood Culture ID Panel (Reflexed)     Status: Abnormal   Collection Time: 04/17/20  8:11 AM  Result Value Ref Range   Enterococcus faecalis DETECTED (A) NOT DETECTED    Comment: CRITICAL RESULT CALLED TO, READ BACK BY AND VERIFIED WITH: Erin Hearing PHARMD _1  04/17/20 EB    Enterococcus Faecium NOT DETECTED NOT DETECTED    Listeria monocytogenes NOT DETECTED NOT DETECTED   Staphylococcus species NOT DETECTED NOT DETECTED   Staphylococcus aureus (BCID) NOT DETECTED NOT DETECTED   Staphylococcus epidermidis NOT DETECTED NOT DETECTED   Staphylococcus lugdunensis NOT DETECTED NOT DETECTED   Streptococcus species NOT DETECTED NOT DETECTED   Streptococcus agalactiae NOT DETECTED NOT DETECTED   Streptococcus pneumoniae NOT DETECTED NOT DETECTED   Streptococcus pyogenes NOT DETECTED NOT DETECTED   A.calcoaceticus-baumannii NOT DETECTED NOT DETECTED   Bacteroides fragilis NOT DETECTED NOT DETECTED   Enterobacterales NOT DETECTED NOT DETECTED   Enterobacter cloacae complex NOT DETECTED NOT DETECTED   Escherichia coli NOT DETECTED NOT DETECTED   Klebsiella aerogenes NOT DETECTED NOT DETECTED  Klebsiella oxytoca NOT DETECTED NOT DETECTED   Klebsiella pneumoniae NOT DETECTED NOT DETECTED   Proteus species NOT DETECTED NOT DETECTED   Salmonella species NOT DETECTED NOT DETECTED   Serratia marcescens NOT DETECTED NOT DETECTED   Haemophilus influenzae NOT DETECTED NOT DETECTED   Neisseria meningitidis NOT DETECTED NOT DETECTED   Pseudomonas aeruginosa NOT DETECTED NOT DETECTED   Stenotrophomonas maltophilia NOT DETECTED NOT DETECTED   Candida albicans NOT DETECTED NOT DETECTED   Candida auris NOT DETECTED NOT DETECTED   Candida glabrata NOT DETECTED NOT DETECTED   Candida krusei NOT DETECTED NOT DETECTED   Candida parapsilosis NOT DETECTED NOT DETECTED   Candida tropicalis NOT DETECTED NOT DETECTED   Cryptococcus neoformans/gattii NOT DETECTED NOT DETECTED   Vancomycin resistance NOT DETECTED NOT DETECTED    Comment: Performed at [facility], [place] [address] [place], [place] [address]  Glucose, capillary     Status: Abnormal   Collection Time: 04/17/20 11:55 AM  Result Value Ref Range   Glucose-Capillary 269 (H) 70 - 99 mg/dL    Comment: Glucose reference range applies only to samples taken after fasting  for at least 8 hours.  I-STAT 7, (LYTES, BLD GAS, ICA, H+H)     Status: Abnormal   Collection Time: 04/17/20 11:57 AM  Result Value Ref Range   pH, Arterial 7.333 (L) 7.350 - 7.450   pCO2 arterial 60.6 (H) 32.0 - 48.0 mmHg   pO2, Arterial 136 (H) 83.0 - 108.0 mmHg   Bicarbonate 32.2 (H) 20.0 - 28.0 mmol/L   TCO2 34 (H) 22 - 32 mmol/L   O2 Saturation 99.0 %   Acid-Base Excess 5.0 (H) 0.0 - 2.0 mmol/L   Sodium 143 135 - 145 mmol/L   Potassium 4.1 3.5 - 5.1 mmol/L   Calcium, Ion 1.17 1.15 - 1.40 mmol/L   HCT 27.0 (L) 39.0 - 52.0 %   Hemoglobin 9.2 (L) 13.0 - 17.0 g/dL   Patient temperature 36.9 C    Sample type ARTERIAL   APTT     Status: Abnormal   Collection Time: 04/17/20  4:06 PM  Result Value Ref Range   aPTT 89 (H) 24 - 36 seconds    Comment:        IF BASELINE aPTT IS ELEVATED, SUGGEST PATIENT RISK ASSESSMENT BE USED TO DETERMINE APPROPRIATE ANTICOAGULANT THERAPY. Performed at [facility], [place] [address] [place], [place] [address]   Lactic acid, plasma     Status: None   Collection Time: 04/17/20  4:06 PM  Result Value Ref Range   Lactic Acid, Venous 1.1 0.5 - 1.9 mmol/L    Comment: Performed at [facility] [address], [place], [place] [address]  CBC     Status: Abnormal   Collection Time: 04/17/20  4:06 PM  Result Value Ref Range   WBC 25.0 (H) 4.0 - 10.5 K/uL   RBC 2.87 (L) 4.22 - 5.81 MIL/uL   Hemoglobin 8.4 (L) 13.0 - 17.0 g/dL   HCT 28.1 (L) 39.0 - 52.0 %   MCV 97.9 80.0 - 100.0 fL   MCH 29.3 26.0 - 34.0 pg   MCHC 29.9 (L) 30.0 - 36.0 g/dL   RDW 17.6 (H) 11.5 - 15.5 %   Platelets 215 150 - 400 K/uL   nRBC 0.1 0.0 - 0.2 %    Comment: Performed at [facility] [address] [place], [place] [address]  Basic metabolic panel     Status:  Abnormal   Collection Time: 04/17/20  4:06 PM  Result Value Ref Range   Sodium 141 135 - 145 mmol/L   Potassium 3.9 3.5 - 5.1 mmol/L   Chloride 100 98 - 111 mmol/L   CO2 30 22 - 32 mmol/L    Glucose, Bld 330 (H) 70 - 99 mg/dL    Comment: Glucose reference range applies only to samples taken after fasting for at least 8 hours.   BUN 85 (H) 6 - 20 mg/dL   Creatinine, Ser 1.10 0.61 - 1.24 mg/dL   Calcium 8.3 (L) 8.9 - 10.3 mg/dL   GFR, Estimated >60 >60 mL/min    Comment: (NOTE) Calculated using the CKD-EPI Creatinine Equation (2021)    Anion gap 11 5 - 15    Comment: Performed at [facility] [address], [place], [place] [address]  Glucose, capillary     Status: Abnormal   Collection Time: 04/17/20  4:12 PM  Result Value Ref Range   Glucose-Capillary 289 (H) 70 - 99 mg/dL    Comment: Glucose reference range applies only to samples taken after fasting for at least 8 hours.  I-STAT 7, (LYTES, BLD GAS, ICA, H+H)     Status: Abnormal   Collection Time: 04/17/20  5:07 PM  Result Value Ref Range   pH, Arterial 7.356 7.350 - 7.450   pCO2 arterial 58.2 (H) 32.0 - 48.0 mmHg   pO2, Arterial 101 83.0 - 108.0 mmHg   Bicarbonate 32.7 (H) 20.0 - 28.0 mmol/L   TCO2 34 (H) 22 - 32 mmol/L   O2 Saturation 97.0 %   Acid-Base Excess 6.0 (H) 0.0 - 2.0 mmol/L   Sodium 144 135 - 145 mmol/L   Potassium 3.9 3.5 - 5.1 mmol/L   Calcium, Ion 1.19 1.15 - 1.40 mmol/L   HCT 26.0 (L) 39.0 - 52.0 %   Hemoglobin 8.8 (L) 13.0 - 17.0 g/dL   Patient temperature 36.6 C    Collection site art line    Drawn by Nurse    Sample type ARTERIAL   I-STAT 7, (LYTES, BLD GAS, ICA, H+H)     Status: Abnormal   Collection Time: 04/17/20  6:17 PM  Result Value Ref Range   pH, Arterial 7.329 (L) 7.350 - 7.450   pCO2 arterial 63.3 (H) 32.0 - 48.0 mmHg   pO2, Arterial 125 (H) 83.0 - 108.0 mmHg   Bicarbonate 33.4 (H) 20.0 - 28.0 mmol/L   TCO2 35 (H) 22 - 32 mmol/L   O2 Saturation 98.0 %   Acid-Base Excess 6.0 (H) 0.0 - 2.0 mmol/L   Sodium 143 135 - 145 mmol/L   Potassium 3.8 3.5 - 5.1 mmol/L   Calcium, Ion 1.18 1.15 - 1.40 mmol/L   HCT 26.0 (L) 39.0 - 52.0 %   Hemoglobin 8.8 (L) 13.0 - 17.0 g/dL    Patient temperature 36.9 C    Collection site art line    Drawn by Nurse    Sample type ARTERIAL   Glucose, capillary     Status: Abnormal   Collection Time: 04/17/20  7:59 PM  Result Value Ref Range   Glucose-Capillary 293 (H) 70 - 99 mg/dL    Comment: Glucose reference range applies only to samples taken after fasting for at least 8 hours.  I-STAT 7, (LYTES, BLD GAS, ICA, H+H)     Status: Abnormal   Collection Time: 04/17/20 10:08 PM  Result Value Ref Range   pH, Arterial 7.352 7.350 - 7.450  pCO2 arterial 58.1 (H) 32.0 - 48.0 mmHg   pO2, Arterial 98 83.0 - 108.0 mmHg   Bicarbonate 32.2 (H) 20.0 - 28.0 mmol/L   TCO2 34 (H) 22 - 32 mmol/L   O2 Saturation 97.0 %   Acid-Base Excess 6.0 (H) 0.0 - 2.0 mmol/L   Sodium 143 135 - 145 mmol/L   Potassium 3.7 3.5 - 5.1 mmol/L   Calcium, Ion 1.21 1.15 - 1.40 mmol/L   HCT 27.0 (L) 39.0 - 52.0 %   Hemoglobin 9.2 (L) 13.0 - 17.0 g/dL   Patient temperature 98.3 F    Collection site Magazine features editor by Operator    Sample type ARTERIAL   Urinalysis, Routine w reflex microscopic     Status: Abnormal   Collection Time: 04/17/20 10:49 PM  Result Value Ref Range   Color, Urine AMBER (A) YELLOW    Comment: BIOCHEMICALS MAY BE AFFECTED BY COLOR   APPearance HAZY (A) CLEAR   Specific Gravity, Urine 1.020 1.005 - 1.030   pH 5.0 5.0 - 8.0   Glucose, UA NEGATIVE NEGATIVE mg/dL   Hgb urine dipstick SMALL (A) NEGATIVE   Bilirubin Urine NEGATIVE NEGATIVE   Ketones, ur NEGATIVE NEGATIVE mg/dL   Protein, ur NEGATIVE NEGATIVE mg/dL   Nitrite NEGATIVE NEGATIVE   Leukocytes,Ua LARGE (A) NEGATIVE   RBC / HPF 21-50 0 - 5 RBC/hpf   WBC, UA 21-50 0 - 5 WBC/hpf   Bacteria, UA RARE (A) NONE SEEN   Squamous Epithelial / LPF 0-5 0 - 5   Mucus PRESENT     Comment: Performed at [facility], [address] [place], [place] [address]  Glucose, capillary     Status: Abnormal   Collection Time: 04/17/20 11:48 PM  Result Value Ref Range    Glucose-Capillary 278 (H) 70 - 99 mg/dL    Comment: Glucose reference range applies only to samples taken after fasting for at least 8 hours.  Glucose, capillary     Status: Abnormal   Collection Time: 04/18/20  4:09 AM  Result Value Ref Range   Glucose-Capillary 262 (H) 70 - 99 mg/dL    Comment: Glucose reference range applies only to samples taken after fasting for at least 8 hours.  Protime-INR     Status: Abnormal   Collection Time: 04/18/20  4:16 AM  Result Value Ref Range   Prothrombin Time 19.3 (H) 11.4 - 15.2 seconds   INR 1.7 (H) 0.8 - 1.2    Comment: (NOTE) INR goal varies based on device and disease states. Performed at [facility], [place] [address], [place], [place] [address]   APTT     Status: Abnormal   Collection Time: 04/18/20  4:16 AM  Result Value Ref Range   aPTT 76 (H) 24 - 36 seconds    Comment:        IF BASELINE aPTT IS ELEVATED, SUGGEST PATIENT RISK ASSESSMENT BE USED TO DETERMINE APPROPRIATE ANTICOAGULANT THERAPY. Performed at [facility], [place] [address], [place], [place] [address]   Lactic acid, plasma     Status: None   Collection Time: 04/18/20  4:16 AM  Result Value Ref Range   Lactic Acid, Venous 1.2 0.5 - 1.9 mmol/L    Comment: Performed at [facility] [address], [place], [place] [address]  CBC     Status: Abnormal   Collection Time: 04/18/20  4:16 AM  Result Value Ref Range   WBC  21.8 (H) 4.0 - 10.5 K/uL   RBC 2.90 (L) 4.22 - 5.81 MIL/uL   Hemoglobin 8.7 (L) 13.0 - 17.0 g/dL   HCT 28.4 (L) 39.0 - 52.0 %   MCV 97.9 80.0 - 100.0 fL   MCH 30.0 26.0 - 34.0 pg   MCHC 30.6 30.0 - 36.0 g/dL   RDW 17.8 (H) 11.5 - 15.5 %   Platelets 206 150 - 400 K/uL   nRBC 0.1 0.0 - 0.2 %    Comment: Performed at [facility] [address], [place], [place] [address]  Basic metabolic panel     Status: Abnormal   Collection Time: 04/18/20  4:16 AM  Result Value Ref Range   Sodium 144 135 - 145 mmol/L   Potassium 3.6 3.5 - 5.1  mmol/L   Chloride 103 98 - 111 mmol/L   CO2 30 22 - 32 mmol/L   Glucose, Bld 292 (H) 70 - 99 mg/dL    Comment: Glucose reference range applies only to samples taken after fasting for at least 8 hours.   BUN 78 (H) 6 - 20 mg/dL   Creatinine, Ser 0.95 0.61 - 1.24 mg/dL   Calcium 8.6 (L) 8.9 - 10.3 mg/dL   GFR, Estimated >60 >60 mL/min    Comment: (NOTE) Calculated using the CKD-EPI Creatinine Equation (2021)    Anion gap 11 5 - 15    Comment: Performed at [facility] [address], [place], [place] [address]  Fibrinogen     Status: Abnormal   Collection Time: 04/18/20  4:16 AM  Result Value Ref Range   Fibrinogen 610 (H) 210 - 475 mg/dL    Comment: Performed at [facility] [address] [place], [place] [address]  Procalcitonin     Status: None   Collection Time: 04/18/20  4:16 AM  Result Value Ref Range   Procalcitonin 14.49 ng/mL    Comment:        Interpretation: PCT >= 10 ng/mL: Important systemic inflammatory response, almost exclusively due to severe bacterial sepsis or septic shock. (NOTE)       Sepsis PCT Algorithm           Lower Respiratory Tract                                      Infection PCT Algorithm    ----------------------------     ----------------------------         PCT < 0.25 ng/mL                PCT < 0.10 ng/mL          Strongly encourage             Strongly discourage   discontinuation of antibiotics    initiation of antibiotics    ----------------------------     -----------------------------       PCT 0.25 - 0.50 ng/mL            PCT 0.10 - 0.25 ng/mL               OR       >80% decrease in PCT            Discourage initiation of  antibiotics      Encourage discontinuation           of antibiotics    ----------------------------     -----------------------------         PCT >= 0.50 ng/mL              PCT 0.26 - 0.50 ng/mL                AND       <80% decrease in PCT              Encourage initiation of                                             antibiotics       Encourage continuation           of antibiotics    ----------------------------     -----------------------------        PCT >= 0.50 ng/mL                  PCT > 0.50 ng/mL               AND         increase in PCT                  Strongly encourage                                      initiation of antibiotics    Strongly encourage escalation           of antibiotics                                     -----------------------------                                           PCT <= 0.25 ng/mL                                                 OR                                        > 80% decrease in PCT                                      Discontinue / Do not initiate                                             antibiotics  Performed at [facility], [place] [address] [place], [place] [address]   Lactate dehydrogenase     Status: Abnormal   Collection  Time: 04/18/20  4:16 AM  Result Value Ref Range   LDH 464 (H) 98 - 192 U/L    Comment: Performed at [facility], [place] [address] [place], [place] [address]  I-STAT 7, (LYTES, BLD GAS, ICA, H+H)     Status: Abnormal   Collection Time: 04/18/20  4:22 AM  Result Value Ref Range   pH, Arterial 7.307 (L) 7.350 - 7.450   pCO2 arterial 67.1 (HH) 32.0 - 48.0 mmHg   pO2, Arterial 110 (H) 83.0 - 108.0 mmHg   Bicarbonate 33.6 (H) 20.0 - 28.0 mmol/L   TCO2 36 (H) 22 - 32 mmol/L   O2 Saturation 98.0 %   Acid-Base Excess 6.0 (H) 0.0 - 2.0 mmol/L   Sodium 144 135 - 145 mmol/L   Potassium 3.4 (L) 3.5 - 5.1 mmol/L   Calcium, Ion 1.23 1.15 - 1.40 mmol/L   HCT 33.0 (L) 39.0 - 52.0 %   Hemoglobin 11.2 (L) 13.0 - 17.0 g/dL   Patient temperature 98.4 F    Collection site Magazine features editor by Operator    Sample type ARTERIAL    Comment VALUES EXPECTED, NO REPEAT   I-STAT 7, (LYTES, BLD GAS, ICA, H+H)     Status: Abnormal   Collection Time: 04/18/20   9:28 AM  Result Value Ref Range   pH, Arterial 7.295 (L) 7.350 - 7.450   pCO2 arterial 70.0 (HH) 32.0 - 48.0 mmHg   pO2, Arterial 102 83.0 - 108.0 mmHg   Bicarbonate 34.1 (H) 20.0 - 28.0 mmol/L   TCO2 36 (H) 22 - 32 mmol/L   O2 Saturation 97.0 %   Acid-Base Excess 6.0 (H) 0.0 - 2.0 mmol/L   Sodium 144 135 - 145 mmol/L   Potassium 4.0 3.5 - 5.1 mmol/L   Calcium, Ion 1.23 1.15 - 1.40 mmol/L   HCT 27.0 (L) 39.0 - 52.0 %   Hemoglobin 9.2 (L) 13.0 - 17.0 g/dL   Patient temperature 36.8 C    Collection site Radial    Drawn by HIDE    Sample type ARTERIAL       Component Value Date/Time   SDES BLOOD LEFT WRIST 04/17/2020 0811   SPECREQUEST  04/17/2020 0811    BOTTLES DRAWN AEROBIC AND ANAEROBIC Blood Culture adequate volume   CULT (A) 04/17/2020 0811    ENTEROCOCCUS FAECALIS SUSCEPTIBILITIES TO FOLLOW Performed at [facility], [address], [place], [place] [address]    REPTSTATUS PENDING 04/17/2020 2637   DG CHEST PORT 1 VIEW  Result Date: 04/18/2020 CLINICAL DATA:  ECMO. EXAM: PORTABLE CHEST 1 VIEW COMPARISON:  One-view chest x-ray 04/17/2020 FINDINGS: Heart is enlarged. ECMO catheter in stable position. Feeding tube courses off the inferior border of the film. Diffuse bilateral airspace opacities are again noted. Right pleural effusion evident. There is no significant interval change. IMPRESSION: Stable cardiomegaly and diffuse bilateral airspace opacities. Findings consistent with congestive heart failure or infection. Support apparatus including ECMO catheter is stable. Electronically Signed   By: San Morelle M.D.   On: 04/18/2020 06:54   DG CHEST PORT 1 VIEW  Result Date: 04/17/2020 CLINICAL DATA:  History of ECMO. EXAM: PORTABLE CHEST 1 VIEW COMPARISON:  Yesterday FINDINGS: Tracheostomy tube is no longer seen. Feeding tube with tip reaching the stomach at least. Right PICC with obscured tip. Unchanged ECMO cannula. Possibly progressed confluent bilateral airspace  disease. Stable heart size. No visible air leak. IMPRESSION: 1. Unchanged remaining hardware. 2. Stable or  progressive pulmonary density. Electronically Signed   By: Monte Fantasia M.D.   On: 04/17/2020 07:14   DG Abd Portable 1V  Result Date: 04/16/2020 CLINICAL DATA:  Nasoenteric feeding tube repositioning EXAM: PORTABLE ABDOMEN - 1 VIEW COMPARISON:  04/10/2020 FINDINGS: Nasoenteric feeding tube is seen with its tip overlying the expected proximal jejunum, similar to that noted on prior examination. Normal abdominal gas pattern. ECMO cannula overlies its expected position bibasilar pulmonary infiltrates with air bronchograms and small right pleural effusion again noted. IMPRESSION: Nasoenteric feeding tube tip within the expected proximal jejunum. Electronically Signed   By: Fidela [place] MD   On: 04/16/2020 12:35   Recent Results (from the past 240 hour(s))  MRSA PCR Screening     Status: None   Collection Time: 04/09/20 12:09 AM   Specimen: Nasopharyngeal  Result Value Ref Range Status   MRSA by PCR NEGATIVE NEGATIVE Final    Comment:        The GeneXpert MRSA Assay (FDA approved for NASAL specimens only), is one component of a comprehensive MRSA colonization surveillance program. It is not intended to diagnose MRSA infection nor to guide or monitor treatment for MRSA infections. Performed at [facility], [place] [address] [place], [place] [address]   Culture, respiratory (non-expectorated)     Status: None   Collection Time: 04/13/20  5:23 PM   Specimen: Tracheal Aspirate; Respiratory  Result Value Ref Range Status   Specimen Description TRACHEAL ASPIRATE  Final   Special Requests NONE  Final   Gram Stain   Final    MODERATE WBC PRESENT, PREDOMINANTLY PMN FEW GRAM POSITIVE RODS RARE GRAM POSITIVE COCCI    Culture   Final    FEW Normal respiratory flora-no Staph aureus or Pseudomonas seen Performed at [facility], [address] [place], [place] [address]     Report Status 04/16/2020 FINAL  Final  Culture, blood (routine x 2)     Status: None (Preliminary result)   Collection Time: 04/17/20  8:01 AM   Specimen: BLOOD LEFT HAND  Result Value Ref Range Status   Specimen Description BLOOD LEFT HAND  Final   Special Requests   Final    BOTTLES DRAWN AEROBIC ONLY Blood Culture adequate volume   Culture  Setup Time   Final    GRAM POSITIVE COCCI IN CHAINS IN PAIRS AEROBIC BOTTLE ONLY CRITICAL VALUE NOTED.  VALUE IS CONSISTENT WITH PREVIOUSLY REPORTED AND CALLED VALUE. Performed at [facility], [place] [address] [place], [place] [address]    Culture GRAM POSITIVE COCCI  Final   Report Status PENDING  Incomplete  Culture, blood (routine x 2)     Status: Abnormal (Preliminary result)   Collection Time: 04/17/20  8:11 AM   Specimen: BLOOD LEFT WRIST  Result Value Ref Range Status   Specimen Description BLOOD LEFT WRIST  Final   Special Requests   Final    BOTTLES DRAWN AEROBIC AND ANAEROBIC Blood Culture adequate volume   Culture  Setup Time   Final    GRAM POSITIVE COCCI IN CHAINS IN BOTH AEROBIC AND ANAEROBIC BOTTLES CRITICAL RESULT CALLED TO, READ BACK BY AND VERIFIED WITH: Erin Hearing PHARMD _0  04/17/20 EB    Culture (A)  Final    ENTEROCOCCUS FAECALIS SUSCEPTIBILITIES TO FOLLOW Performed at [facility], [place] [address], [place], [place] [address]    Report Status PENDING  Incomplete  Blood Culture ID Panel (Reflexed)     Status:  Abnormal   Collection Time: 04/17/20  8:11 AM  Result Value Ref Range Status   Enterococcus faecalis DETECTED (A) NOT DETECTED Final    Comment: CRITICAL RESULT CALLED TO, READ BACK BY AND VERIFIED WITH: Erin Hearing PHARMD _0  04/17/20 EB    Enterococcus Faecium NOT DETECTED NOT DETECTED Final   Listeria monocytogenes NOT DETECTED NOT DETECTED Final   Staphylococcus species NOT DETECTED NOT DETECTED Final   Staphylococcus aureus (BCID) NOT DETECTED NOT DETECTED Final   Staphylococcus  epidermidis NOT DETECTED NOT DETECTED Final   Staphylococcus lugdunensis NOT DETECTED NOT DETECTED Final   Streptococcus species NOT DETECTED NOT DETECTED Final   Streptococcus agalactiae NOT DETECTED NOT DETECTED Final   Streptococcus pneumoniae NOT DETECTED NOT DETECTED Final   Streptococcus pyogenes NOT DETECTED NOT DETECTED Final   A.calcoaceticus-baumannii NOT DETECTED NOT DETECTED Final   Bacteroides fragilis NOT DETECTED NOT DETECTED Final   Enterobacterales NOT DETECTED NOT DETECTED Final   Enterobacter cloacae complex NOT DETECTED NOT DETECTED Final   Escherichia coli NOT DETECTED NOT DETECTED Final   Klebsiella aerogenes NOT DETECTED NOT DETECTED Final   Klebsiella oxytoca NOT DETECTED NOT DETECTED Final   Klebsiella pneumoniae NOT DETECTED NOT DETECTED Final   Proteus species NOT DETECTED NOT DETECTED Final   Salmonella species NOT DETECTED NOT DETECTED Final   Serratia marcescens NOT DETECTED NOT DETECTED Final   Haemophilus influenzae NOT DETECTED NOT DETECTED Final   Neisseria meningitidis NOT DETECTED NOT DETECTED Final   Pseudomonas aeruginosa NOT DETECTED NOT DETECTED Final   Stenotrophomonas maltophilia NOT DETECTED NOT DETECTED Final   Candida albicans NOT DETECTED NOT DETECTED Final   Candida auris NOT DETECTED NOT DETECTED Final   Candida glabrata NOT DETECTED NOT DETECTED Final   Candida krusei NOT DETECTED NOT DETECTED Final   Candida parapsilosis NOT DETECTED NOT DETECTED Final   Candida tropicalis NOT DETECTED NOT DETECTED Final   Cryptococcus neoformans/gattii NOT DETECTED NOT DETECTED Final   Vancomycin resistance NOT DETECTED NOT DETECTED Final    Comment: Performed at [facility], [address] [place], [place] [address]    Microbiology: Recent Results (from the past 240 hour(s))  MRSA PCR Screening     Status: None   Collection Time: 04/09/20 12:09 AM   Specimen: Nasopharyngeal  Result Value Ref Range Status   MRSA by PCR NEGATIVE NEGATIVE  Final    Comment:        The GeneXpert MRSA Assay (FDA approved for NASAL specimens only), is one component of a comprehensive MRSA colonization surveillance program. It is not intended to diagnose MRSA infection nor to guide or monitor treatment for MRSA infections. Performed at [facility], [place] [address] [place], [place] [address]   Culture, respiratory (non-expectorated)     Status: None   Collection Time: 04/13/20  5:23 PM   Specimen: Tracheal Aspirate; Respiratory  Result Value Ref Range Status   Specimen Description TRACHEAL ASPIRATE  Final   Special Requests NONE  Final   Gram Stain   Final    MODERATE WBC PRESENT, PREDOMINANTLY PMN FEW GRAM POSITIVE RODS RARE GRAM POSITIVE COCCI    Culture   Final    FEW Normal respiratory flora-no Staph aureus or Pseudomonas seen Performed at [facility], [address] [place], [place] [address]    Report Status 04/16/2020 FINAL  Final  Culture, blood (routine x 2)     Status: None (Preliminary result)   Collection Time: 04/17/20  8:01 AM  Specimen: BLOOD LEFT HAND  Result Value Ref Range Status   Specimen Description BLOOD LEFT HAND  Final   Special Requests   Final    BOTTLES DRAWN AEROBIC ONLY Blood Culture adequate volume   Culture  Setup Time   Final    GRAM POSITIVE COCCI IN CHAINS IN PAIRS AEROBIC BOTTLE ONLY CRITICAL VALUE NOTED.  VALUE IS CONSISTENT WITH PREVIOUSLY REPORTED AND CALLED VALUE. Performed at [facility], [place] [address], [place], [place] [address]    Culture GRAM POSITIVE COCCI  Final   Report Status PENDING  Incomplete  Culture, blood (routine x 2)     Status: Abnormal (Preliminary result)   Collection Time: 04/17/20  8:11 AM   Specimen: BLOOD LEFT WRIST  Result Value Ref Range Status   Specimen Description BLOOD LEFT WRIST  Final   Special Requests   Final    BOTTLES DRAWN AEROBIC AND ANAEROBIC Blood Culture adequate volume   Culture  Setup Time   Final    GRAM POSITIVE  COCCI IN CHAINS IN BOTH AEROBIC AND ANAEROBIC BOTTLES CRITICAL RESULT CALLED TO, READ BACK BY AND VERIFIED WITH: Erin Hearing PHARMD _0  04/17/20 EB    Culture (A)  Final    ENTEROCOCCUS FAECALIS SUSCEPTIBILITIES TO FOLLOW Performed at [facility], [place] [address] [place], [place] [address]    Report Status PENDING  Incomplete  Blood Culture ID Panel (Reflexed)     Status: Abnormal   Collection Time: 04/17/20  8:11 AM  Result Value Ref Range Status   Enterococcus faecalis DETECTED (A) NOT DETECTED Final    Comment: CRITICAL RESULT CALLED TO, READ BACK BY AND VERIFIED WITH: Erin Hearing PHARMD _1  04/17/20 EB    Enterococcus Faecium NOT DETECTED NOT DETECTED Final   Listeria monocytogenes NOT DETECTED NOT DETECTED Final   Staphylococcus species NOT DETECTED NOT DETECTED Final   Staphylococcus aureus (BCID) NOT DETECTED NOT DETECTED Final   Staphylococcus epidermidis NOT DETECTED NOT DETECTED Final   Staphylococcus lugdunensis NOT DETECTED NOT DETECTED Final   Streptococcus species NOT DETECTED NOT DETECTED Final   Streptococcus agalactiae NOT DETECTED NOT DETECTED Final   Streptococcus pneumoniae NOT DETECTED NOT DETECTED Final   Streptococcus pyogenes NOT DETECTED NOT DETECTED Final   A.calcoaceticus-baumannii NOT DETECTED NOT DETECTED Final   Bacteroides fragilis NOT DETECTED NOT DETECTED Final   Enterobacterales NOT DETECTED NOT DETECTED Final   Enterobacter cloacae complex NOT DETECTED NOT DETECTED Final   Escherichia coli NOT DETECTED NOT DETECTED Final   Klebsiella aerogenes NOT DETECTED NOT DETECTED Final   Klebsiella oxytoca NOT DETECTED NOT DETECTED Final   Klebsiella pneumoniae NOT DETECTED NOT DETECTED Final   Proteus species NOT DETECTED NOT DETECTED Final   Salmonella species NOT DETECTED NOT DETECTED Final   Serratia marcescens NOT DETECTED NOT DETECTED Final   Haemophilus influenzae NOT DETECTED NOT DETECTED Final   Neisseria meningitidis NOT DETECTED NOT  DETECTED Final   Pseudomonas aeruginosa NOT DETECTED NOT DETECTED Final   Stenotrophomonas maltophilia NOT DETECTED NOT DETECTED Final   Candida albicans NOT DETECTED NOT DETECTED Final   Candida auris NOT DETECTED NOT DETECTED Final   Candida glabrata NOT DETECTED NOT DETECTED Final   Candida krusei NOT DETECTED NOT DETECTED Final   Candida parapsilosis NOT DETECTED NOT DETECTED Final   Candida tropicalis NOT DETECTED NOT DETECTED Final   Cryptococcus neoformans/gattii NOT DETECTED NOT DETECTED Final   Vancomycin resistance NOT DETECTED NOT DETECTED Final    Comment: Performed at [facility]  [facility], [place] [address] [place], [place] [address]    Radiographs and labs were personally reviewed by me.   Bobby Rumpf, MD [facility] [phone] 04/18/2020, 9:42 AM

## 2020-04-18 NOTE — Progress Notes (Signed)
ANTICOAGULATION CONSULT NOTE  Pharmacy Consult for bivalirudin Indication: ECMO and VTE  Labs: Recent Labs    04/16/20 0353 04/16/20 0355 04/17/20 0435 04/17/20 1157 04/17/20 1606 04/17/20 1707 04/18/20 0416 04/18/20 0422 04/18/20 1501 04/18/20 1607 04/18/20 1609  HGB 7.7*   < > 9.9*   < > 8.4*   < > 8.7*   < > 8.8* 7.9* 8.5*  HCT 26.0*   < > 33.0*   < > 28.1*   < > 28.4*   < > 26.0* 26.9* 25.0*  PLT 239   < > 262  --  215  --  206  --   --  196  --   APTT 68*   < > 67*  --  89*  --  76*  --   --  70*  --   LABPROT 18.2*  --  16.9*  --   --   --  19.3*  --   --   --   --   INR 1.6*  --  1.4*  --   --   --  1.7*  --   --   --   --   CREATININE 0.79   < > 0.73  --  1.10  --  0.95  --   --  0.94  --    < > = values in this interval not displayed.    Assessment: 54 yoM admitted with COVID-19 PNA with worsening hypoxia, s/p cannulation for ECMO. Pt was started on IV heparin prior to cannulation due to acute DVTs and possible PE, transitioned to bivalirudin with ECMO. Now s/p tPA on 1/5 and tracheostomy on 1/6. Circuit last changed 1/26. -APTT= 70s, hg= 8.5 (stable)  Goal of Therapy:  aPTT 60-80 seconds   Plan:  -Continue bivalirudin at0.07 mg/kg/hr (using order-specific wt 72.1kg) -Monitor q12h aPTT/CBC, LDH, and for s/sx of bleeding  Sheppard Coil PharmD., BCPS Clinical Pharmacist 04/18/2020 5:17 PM

## 2020-04-19 ENCOUNTER — Inpatient Hospital Stay (HOSPITAL_COMMUNITY): Payer: Medicaid Other

## 2020-04-19 ENCOUNTER — Inpatient Hospital Stay: Payer: Self-pay

## 2020-04-19 DIAGNOSIS — I38 Endocarditis, valve unspecified: Secondary | ICD-10-CM

## 2020-04-19 DIAGNOSIS — R7881 Bacteremia: Secondary | ICD-10-CM

## 2020-04-19 LAB — POCT I-STAT 7, (LYTES, BLD GAS, ICA,H+H)
Acid-Base Excess: 2 mmol/L (ref 0.0–2.0)
Acid-Base Excess: 2 mmol/L (ref 0.0–2.0)
Acid-Base Excess: 3 mmol/L — ABNORMAL HIGH (ref 0.0–2.0)
Acid-Base Excess: 4 mmol/L — ABNORMAL HIGH (ref 0.0–2.0)
Acid-Base Excess: 4 mmol/L — ABNORMAL HIGH (ref 0.0–2.0)
Bicarbonate: 28.5 mmol/L — ABNORMAL HIGH (ref 20.0–28.0)
Bicarbonate: 29 mmol/L — ABNORMAL HIGH (ref 20.0–28.0)
Bicarbonate: 29.3 mmol/L — ABNORMAL HIGH (ref 20.0–28.0)
Bicarbonate: 29.7 mmol/L — ABNORMAL HIGH (ref 20.0–28.0)
Bicarbonate: 30.5 mmol/L — ABNORMAL HIGH (ref 20.0–28.0)
Calcium, Ion: 1.24 mmol/L (ref 1.15–1.40)
Calcium, Ion: 1.31 mmol/L (ref 1.15–1.40)
Calcium, Ion: 1.32 mmol/L (ref 1.15–1.40)
Calcium, Ion: 1.32 mmol/L (ref 1.15–1.40)
Calcium, Ion: 1.34 mmol/L (ref 1.15–1.40)
HCT: 26 % — ABNORMAL LOW (ref 39.0–52.0)
HCT: 28 % — ABNORMAL LOW (ref 39.0–52.0)
HCT: 29 % — ABNORMAL LOW (ref 39.0–52.0)
HCT: 30 % — ABNORMAL LOW (ref 39.0–52.0)
HCT: 30 % — ABNORMAL LOW (ref 39.0–52.0)
Hemoglobin: 10.2 g/dL — ABNORMAL LOW (ref 13.0–17.0)
Hemoglobin: 10.2 g/dL — ABNORMAL LOW (ref 13.0–17.0)
Hemoglobin: 8.8 g/dL — ABNORMAL LOW (ref 13.0–17.0)
Hemoglobin: 9.5 g/dL — ABNORMAL LOW (ref 13.0–17.0)
Hemoglobin: 9.9 g/dL — ABNORMAL LOW (ref 13.0–17.0)
O2 Saturation: 87 %
O2 Saturation: 90 %
O2 Saturation: 92 %
O2 Saturation: 92 %
O2 Saturation: 96 %
Patient temperature: 36.8
Patient temperature: 36.8
Patient temperature: 37
Patient temperature: 98.7
Patient temperature: 98.7
Potassium: 4 mmol/L (ref 3.5–5.1)
Potassium: 4 mmol/L (ref 3.5–5.1)
Potassium: 4 mmol/L (ref 3.5–5.1)
Potassium: 4.2 mmol/L (ref 3.5–5.1)
Potassium: 4.8 mmol/L (ref 3.5–5.1)
Sodium: 146 mmol/L — ABNORMAL HIGH (ref 135–145)
Sodium: 146 mmol/L — ABNORMAL HIGH (ref 135–145)
Sodium: 148 mmol/L — ABNORMAL HIGH (ref 135–145)
Sodium: 148 mmol/L — ABNORMAL HIGH (ref 135–145)
Sodium: 149 mmol/L — ABNORMAL HIGH (ref 135–145)
TCO2: 30 mmol/L (ref 22–32)
TCO2: 31 mmol/L (ref 22–32)
TCO2: 31 mmol/L (ref 22–32)
TCO2: 31 mmol/L (ref 22–32)
TCO2: 32 mmol/L (ref 22–32)
pCO2 arterial: 46.6 mmHg (ref 32.0–48.0)
pCO2 arterial: 53.2 mmHg — ABNORMAL HIGH (ref 32.0–48.0)
pCO2 arterial: 54.9 mmHg — ABNORMAL HIGH (ref 32.0–48.0)
pCO2 arterial: 55.5 mmHg — ABNORMAL HIGH (ref 32.0–48.0)
pCO2 arterial: 55.8 mmHg — ABNORMAL HIGH (ref 32.0–48.0)
pH, Arterial: 7.331 — ABNORMAL LOW (ref 7.350–7.450)
pH, Arterial: 7.333 — ABNORMAL LOW (ref 7.350–7.450)
pH, Arterial: 7.338 — ABNORMAL LOW (ref 7.350–7.450)
pH, Arterial: 7.348 — ABNORMAL LOW (ref 7.350–7.450)
pH, Arterial: 7.407 (ref 7.350–7.450)
pO2, Arterial: 57 mmHg — ABNORMAL LOW (ref 83.0–108.0)
pO2, Arterial: 63 mmHg — ABNORMAL LOW (ref 83.0–108.0)
pO2, Arterial: 68 mmHg — ABNORMAL LOW (ref 83.0–108.0)
pO2, Arterial: 69 mmHg — ABNORMAL LOW (ref 83.0–108.0)
pO2, Arterial: 81 mmHg — ABNORMAL LOW (ref 83.0–108.0)

## 2020-04-19 LAB — BASIC METABOLIC PANEL
Anion gap: 11 (ref 5–15)
Anion gap: 10 (ref 5–15)
BUN: 74 mg/dL — ABNORMAL HIGH (ref 6–20)
BUN: 69 mg/dL — ABNORMAL HIGH (ref 6–20)
CO2: 27 mmol/L (ref 22–32)
CO2: 27 mmol/L (ref 22–32)
Calcium: 8.8 mg/dL — ABNORMAL LOW (ref 8.9–10.3)
Calcium: 9 mg/dL (ref 8.9–10.3)
Chloride: 107 mmol/L (ref 98–111)
Chloride: 107 mmol/L (ref 98–111)
Creatinine, Ser: 0.79 mg/dL (ref 0.61–1.24)
Creatinine, Ser: 0.8 mg/dL (ref 0.61–1.24)
GFR, Estimated: >60 mL/min (ref >60)
GFR, Estimated: >60 mL/min (ref >60)
Glucose, Bld: 329 mg/dL — ABNORMAL HIGH (ref 70–99)
Glucose, Bld: 303 mg/dL — ABNORMAL HIGH (ref 70–99)
Potassium: 3.6 mmol/L (ref 3.5–5.1)
Potassium: 4 mmol/L (ref 3.5–5.1)
Sodium: 145 mmol/L (ref 135–145)
Sodium: 144 mmol/L (ref 135–145)

## 2020-04-19 LAB — CBC
HCT: 29 % — ABNORMAL LOW (ref 39.0–52.0)
HCT: 31 % — ABNORMAL LOW (ref 39.0–52.0)
Hemoglobin: 8.8 g/dL — ABNORMAL LOW (ref 13.0–17.0)
Hemoglobin: 9.2 g/dL — ABNORMAL LOW (ref 13.0–17.0)
MCH: 29 pg (ref 26.0–34.0)
MCH: 28.5 pg (ref 26.0–34.0)
MCHC: 30.3 g/dL (ref 30.0–36.0)
MCHC: 29.7 g/dL — ABNORMAL LOW (ref 30.0–36.0)
MCV: 95.7 fL (ref 80.0–100.0)
MCV: 96 fL (ref 80.0–100.0)
Platelets: 193 10*3/uL (ref 150–400)
Platelets: 226 10*3/uL (ref 150–400)
RBC: 3.03 MIL/uL — ABNORMAL LOW (ref 4.22–5.81)
RBC: 3.23 MIL/uL — ABNORMAL LOW (ref 4.22–5.81)
RDW: 20.2 % — ABNORMAL HIGH (ref 11.5–15.5)
RDW: 20.7 % — ABNORMAL HIGH (ref 11.5–15.5)
WBC: 16.3 10*3/uL — ABNORMAL HIGH (ref 4.0–10.5)
WBC: 14.8 10*3/uL — ABNORMAL HIGH (ref 4.0–10.5)
nRBC: 0.2 % (ref 0.0–0.2)
nRBC: 0.3 % — ABNORMAL HIGH (ref 0.0–0.2)

## 2020-04-19 LAB — CULTURE, BLOOD (ROUTINE X 2)
Special Requests: ADEQUATE
Special Requests: ADEQUATE

## 2020-04-19 LAB — PROTIME-INR
INR: 1.5 — ABNORMAL HIGH (ref 0.8–1.2)
Prothrombin Time: 17.9 seconds — ABNORMAL HIGH (ref 11.4–15.2)

## 2020-04-19 LAB — LACTATE DEHYDROGENASE: LDH: 469 U/L — ABNORMAL HIGH (ref 98–192)

## 2020-04-19 LAB — GLUCOSE, CAPILLARY
Glucose-Capillary: 241 mg/dL — ABNORMAL HIGH (ref 70–99)
Glucose-Capillary: 264 mg/dL — ABNORMAL HIGH (ref 70–99)
Glucose-Capillary: 279 mg/dL — ABNORMAL HIGH (ref 70–99)
Glucose-Capillary: 282 mg/dL — ABNORMAL HIGH (ref 70–99)
Glucose-Capillary: 303 mg/dL — ABNORMAL HIGH (ref 70–99)
Glucose-Capillary: 323 mg/dL — ABNORMAL HIGH (ref 70–99)

## 2020-04-19 LAB — LACTIC ACID, PLASMA
Lactic Acid, Venous: 1.1 mmol/L (ref 0.5–1.9)
Lactic Acid, Venous: 1.2 mmol/L (ref 0.5–1.9)

## 2020-04-19 LAB — FIBRINOGEN: Fibrinogen: 538 mg/dL — ABNORMAL HIGH (ref 210–475)

## 2020-04-19 LAB — APTT
aPTT: 61 seconds — ABNORMAL HIGH (ref 24–36)
aPTT: 68 seconds — ABNORMAL HIGH (ref 24–36)

## 2020-04-19 LAB — PROCALCITONIN: Procalcitonin: 8.43 ng/mL

## 2020-04-19 MED ORDER — FENTANYL CITRATE (PF) 100 MCG/2ML IJ SOLN
100.0000 ug | Freq: Once | INTRAMUSCULAR | Status: AC | PRN
  Administered 2020-04-19: 100 ug via INTRAVENOUS
  Filled 2020-04-19: qty 2

## 2020-04-19 MED ORDER — SODIUM CHLORIDE 0.9 % IV SOLN
0.0900 mg/kg/h | INTRAVENOUS | Status: DC
  Administered 2020-04-19 – 2020-04-20 (×2): 0.08 mg/kg/h via INTRAVENOUS
  Administered 2020-04-22: 0.083 mg/kg/h via INTRAVENOUS
  Administered 2020-04-24 – 2020-05-02 (×6): 0.085 mg/kg/h via INTRAVENOUS
  Administered 2020-05-04: 06:00:00 0.087 mg/kg/h via INTRAVENOUS
  Administered 2020-05-05: 0.09 mg/kg/h via INTRAVENOUS
  Filled 2020-04-19 (×13): qty 250

## 2020-04-19 MED ORDER — FENTANYL CITRATE (PF) 100 MCG/2ML IJ SOLN
INTRAMUSCULAR | Status: AC
  Administered 2020-04-19: 100 ug
  Filled 2020-04-19: qty 2

## 2020-04-19 MED ORDER — KETAMINE HCL-SODIUM CHLORIDE 100-0.9 MG/10ML-% IV SOSY: 100.0000 mg | PREFILLED_SYRINGE | Freq: Once | INTRAVENOUS | Status: DC

## 2020-04-19 MED ORDER — LORAZEPAM 2 MG/ML IJ SOLN: 4.0000 mg | Freq: Once | INTRAMUSCULAR | Status: DC | PRN

## 2020-04-19 MED ORDER — SODIUM CHLORIDE 0.9 % IV SOLN
INTRAVENOUS | Status: DC
  Administered 2020-05-10 (×3): 250 mL via INTRAVENOUS

## 2020-04-19 MED ORDER — INSULIN DETEMIR 100 UNIT/ML ~~LOC~~ SOLN
38.0000 [IU] | Freq: Two times a day (BID) | SUBCUTANEOUS | Status: DC
  Administered 2020-04-19 (×2): 38 [IU] via SUBCUTANEOUS
  Filled 2020-04-19 (×5): qty 0.38

## 2020-04-19 MED ORDER — KETAMINE HCL 50 MG/5ML IJ SOSY
100.0000 mg | PREFILLED_SYRINGE | Freq: Once | INTRAMUSCULAR | Status: AC
  Administered 2020-04-19: 50 mg via INTRAVENOUS

## 2020-04-19 MED ORDER — FENTANYL CITRATE (PF) 100 MCG/2ML IJ SOLN: 100.0000 ug | Freq: Once | INTRAMUSCULAR | Status: AC

## 2020-04-19 MED ORDER — INSULIN ASPART 100 UNIT/ML ~~LOC~~ SOLN
8.0000 [IU] | SUBCUTANEOUS | Status: DC
  Administered 2020-04-19 – 2020-04-20 (×4): 8 [IU] via SUBCUTANEOUS

## 2020-04-19 MED ORDER — FUROSEMIDE 10 MG/ML IJ SOLN
40.0000 mg | Freq: Two times a day (BID) | INTRAMUSCULAR | Status: AC
  Administered 2020-04-19 (×2): 40 mg via INTRAVENOUS
  Filled 2020-04-19 (×2): qty 4

## 2020-04-19 MED ORDER — SODIUM CHLORIDE 0.9% FLUSH
10.0000 mL | Freq: Two times a day (BID) | INTRAVENOUS | Status: DC
  Administered 2020-04-19 – 2020-05-10 (×39): 10 mL

## 2020-04-19 MED ORDER — PIPERACILLIN-TAZOBACTAM 3.375 G IVPB: 3.3750 g | Freq: Three times a day (TID) | INTRAVENOUS | Status: DC

## 2020-04-19 MED ORDER — ACETAZOLAMIDE 250 MG PO TABS
250.0000 mg | ORAL_TABLET | Freq: Two times a day (BID) | ORAL | Status: DC
  Administered 2020-04-19 – 2020-04-21 (×5): 250 mg
  Filled 2020-04-19 (×8): qty 1

## 2020-04-19 MED ORDER — ATROPINE SULFATE 1 MG/10ML IJ SOSY
PREFILLED_SYRINGE | INTRAMUSCULAR | Status: AC
  Filled 2020-04-19: qty 10

## 2020-04-19 MED ORDER — MIDAZOLAM HCL 2 MG/2ML IJ SOLN
INTRAMUSCULAR | Status: AC
  Administered 2020-04-19: 2 mg
  Filled 2020-04-19: qty 2

## 2020-04-19 MED ORDER — SODIUM CHLORIDE 0.9% FLUSH
10.0000 mL | INTRAVENOUS | Status: DC | PRN
  Administered 2020-04-28: 10 mL

## 2020-04-19 MED ORDER — VANCOMYCIN HCL IN DEXTROSE 1-5 GM/200ML-% IV SOLN
1000.0000 mg | Freq: Two times a day (BID) | INTRAVENOUS | Status: DC
  Administered 2020-04-19 – 2020-04-20 (×2): 1000 mg via INTRAVENOUS
  Filled 2020-04-19 (×2): qty 200

## 2020-04-19 MED ORDER — POTASSIUM CHLORIDE 20 MEQ PO PACK
40.0000 meq | PACK | Freq: Once | ORAL | Status: AC
  Administered 2020-04-19: 40 meq
  Filled 2020-04-19: qty 2

## 2020-04-19 NOTE — Progress Notes (Signed)
ANTICOAGULATION CONSULT NOTE  Pharmacy Consult for bivalirudin Indication: ECMO and VTE  Labs: Recent Labs    04/17/20 0435 04/17/20 1157 04/18/20 0416 04/18/20 0422 04/18/20 1607 04/18/20 1609 04/18/20 1657 04/18/20 2016 04/19/20 0003 04/19/20 0411 04/19/20 0631  HGB 9.9*   < > 8.7*   < > 7.9*   < > 7.7*   < > 8.8* 8.8* 10.2*  HCT 33.0*   < > 28.4*   < > 26.9*   < > 26.1*   < > 26.0* 29.0* 30.0*  PLT 262   < > 206  --  196  --  187  --   --  193  --   APTT 67*   < > 76*  --  70*  --   --   --   --  61*  --   LABPROT 16.9*  --  19.3*  --   --   --   --   --   --  17.9*  --   INR 1.4*  --  1.7*  --   --   --   --   --   --  1.5*  --   CREATININE 0.73   < > 0.95  --  0.94  --   --   --   --  0.79  --    < > = values in this interval not displayed.    Assessment: 13 yoM admitted with COVID-19 PNA with worsening hypoxia, s/p cannulation for ECMO. Pt was started on IV heparin prior to cannulation due to acute DVTs and possible PE, transitioned to bivalirudin with ECMO. Now s/p tPA on 1/5 and tracheostomy on 1/6. Circuit last changed 1/26.  APTT this morning therapeutic but down to 61 seconds, H/H stable. LDH and fibrinogen remain stable.  Goal of Therapy:  aPTT 60-80 seconds   Plan:  -Increase bivalirudin to 0.08 mg/kg/hr (using order-specific wt 72.1kg) -Monitor q12h aPTT/CBC, LDH, and for s/sx of bleeding  Fredonia Highland, PharmD, BCPS, [facility] Clinical Pharmacist [phone] Please check AMION for all [facility] Pharmacy numbers 04/19/2020

## 2020-04-19 NOTE — Progress Notes (Signed)
Physical Therapy Treatment Patient Details Name: Alex [name] MRN: [id number] DOB: [birth year] Today's Date: 04/19/2020    History of Present Illness Pt is 50 y.o. male  with no significant past medical history admitted on 03/12/2020 with dyspnea, cough, nausea/vomiting ~ 1 week ago with worsening symptoms of body aches and fatigue and +COVID 02/07/20 and admitted with shortness of breath. Required intubation 03/10/20. Cannulated for VV ECMO 03/10/2020. Oxygenating better with ECMO. Also evidence of RLE DVT, RV thrombus, and high suspicion of PE. Has required chest tube to L lung for collapse which needs further reposition with recurrent collapse. Extubated 03-15-19.  Reintubated 1/5 and trach placed 1/6. Decannulated 04/16/20.    PT Comments    Pt admitted with above diagnosis. Pt was able to tolerate UE and LE exercises with pt assisting with rest breaks. Pt incr RR with little activity. LEft pt in chair position.  Pt currently with functional limitations due to balance and endurance deficits. Pt will benefit from skilled PT to increase their independence and safety with mobility to allow discharge to the venue listed below.     Follow Up Recommendations  CIR;Supervision/Assistance - 24 hour     Equipment Recommendations  Other (comment) (TBD (pt still on ECMO))    Recommendations for Other Services       Precautions / Restrictions Precautions Precautions: Fall Precaution Comments: ECMO, NGtube, Foley, Pt decannulated himself 2/4 Required Braces or Orthoses: Splint/Cast Splint/Cast: Left resting hand splint to be worn for 4 hours at night (8pm-12am) with RN to check thumb at end of wearing time for any pressure issues and contact OT department if so (numbers left in orders section of chart) Restrictions Weight Bearing Restrictions: No    Mobility  Bed Mobility               General bed mobility comments: NT  Transfers                 General transfer comment:  NT  Ambulation/Gait             General Gait Details: TBA   Stairs             Wheelchair Mobility    Modified Rankin (Stroke Patients Only)       Balance                                            Cognition Arousal/Alertness: Awake/alert Behavior During Therapy: Flat affect;Restless Overall Cognitive Status: Difficult to assess                                 General Comments: Appears fatigued; inconsistently following 1 step commands      Exercises General Exercises - Upper Extremity Shoulder Flexion: AROM;Both;10 reps;Seated Shoulder ABduction: PROM;Both;10 reps;Supine Elbow Flexion: Both;10 reps;Seated;AROM;AAROM;Strengthening Elbow Extension: AROM;AAROM;Strengthening;Both;10 reps;Seated Wrist Flexion: AROM;AAROM;Both;10 reps;Seated Wrist Extension: PROM;Both;10 reps;Supine Digit Composite Flexion: AROM;AAROM;Both;10 reps;Seated Composite Extension: PROM;Both;10 reps;Supine General Exercises - Lower Extremity Ankle Circles/Pumps: PROM;Both;10 reps;Supine Quad Sets: AROM;Both;10 reps;Supine Gluteal Sets: AROM;Both;10 reps;Supine Long Arc Quad: Both;Seated;AAROM;5 reps Heel Slides: Both;10 reps;Supine;AAROM Hip ABduction/ADduction: Both;Supine;AAROM;5 reps    General Comments General comments (skin integrity, edema, etc.): Nursing asked for ROM only as pt is septic and has little reserve for activity.      Pertinent Vitals/Pain Pain Assessment: Faces Faces Pain  Scale: Hurts little more Pain Location: generalized Pain Descriptors / Indicators: Discomfort Pain Intervention(s): Limited activity within patient's tolerance;Monitored during session;Repositioned    Home Living                      Prior Function            PT Goals (current goals can now be found in the care plan section) Acute Rehab PT Goals Patient Stated Goal: unable to state Progress towards PT goals: Progressing toward goals     Frequency    Min 3X/week      PT Plan Current plan remains appropriate    Co-evaluation              AM-PAC PT "6 Clicks" Mobility   Outcome Measure  Help needed turning from your back to your side while in a flat bed without using bedrails?: Total Help needed moving from lying on your back to sitting on the side of a flat bed without using bedrails?: Total Help needed moving to and from a bed to a chair (including a wheelchair)?: Total Help needed standing up from a chair using your arms (e.g., wheelchair or bedside chair)?: Total Help needed to walk in hospital room?: Total Help needed climbing 3-5 steps with a railing? : Total 6 Click Score: 6    End of Session   Activity Tolerance: Patient limited by fatigue Patient left: in bed;with nursing/sitter in room;with call bell/phone within reach (left bed in chair position) Nurse Communication: Mobility status PT Visit Diagnosis: Muscle weakness (generalized) (M62.81);Pain Pain - part of body:  (chest)     Time: 9798-9211 PT Time Calculation (min) (ACUTE ONLY): 27 min  Charges:  $Therapeutic Exercise: 23-37 mins                     Ashritha Desrosiers W,PT Acute Rehabilitation Services Pager:  [phone]  Office:  [phone]     Berline Lopes 04/19/2020, 4:14 PM

## 2020-04-19 NOTE — Progress Notes (Signed)
Patient ID: Alex Alex [name], male   DOB: December 31, 1970, 50 y.o.   MRN: [id number]     Advanced Heart Failure Rounding Note  PCP-Cardiologist: No primary care provider on file.   Subjective:    - 12/30: VV ECMO cannulation - 12/31: Left chest tube replaced - 1/2: Extubated. Echo with EF 60-65%, mildly dilated RV with mildly decreased systolic function.  - 1/4: Agitated, suspected aspiration.  Re-intubated.  - 1/5: ECMO cannula repositioned under TEE guidance. TEE showed moderately dilated/moderate-severely dysfunctional RV in setting of hypoxemia. LUE DVT found.  Patient got 1/2 dose of TPA due to initial concern for large Alex [name].  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  - 1/6: Tracheostomy - 1/7: Echo with mild RV dilation/mild RV dysfunction.  - 1/16: Left chest tube out - 1/17: LUE arterial dopplers repeated, showed no obstruction.  - 1/20: Echo with EF 65-70%, mildly D-shaped septum, mildly dilated and mildly dysfunctional RV.  - 1/22: CTA chest: Bilateral upper lobe PEs (suspect chronic), changes c/w ARDS - 1/23: Bronchoscopy showed semi-occlusive ?mass/polyp in the trachea.  - 1/24: Bronchoscopy showed resolution of 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 - 1/26: ECMO circuit changed.  - 2/1: CT chest showed diffuse bronchiectasis as well as diffuse opacity consistent with COVID-19 PNA with ARDS. - 2/3: Trach exchange - 2/5: Extubate to HFNC - 2/6: Enterococcal bacteremia.  Echo showed EF 65-70%, normal-appearing RV, no vegetation noted.   Sweep 4 today, remains on HFNC.  Sedated this morning.  CXR with increased airspace disease.   WBCs 22 => 16, PCT 14.5 => 8.43, now afebrile. He is on vancomycin for Enterococcus faecalis in blood cultures.   I/Os positive, had 1 dose IV Lasix yesterday.  Creatinine stable.    ECMO parameters: 3450 rpm Flow 4.64 L/min Pvenous -74 Delta P 38 Sweep 4 HFNC 10 L  ABG 7.34/53/57/87% LDH  534 => 488 => 547 => 461 => 474 => 489 => 553 => 449 =>  457 => 421 => 464 => 469 PTT 61  Objective:   Weight Range: 71.2 kg Body mass index is 26.94 kg/m.   Vital Signs:   Temp:  [98 F (36.7 C)-98.7 F (37.1 C)] 98.7 F (37.1 C) (02/07 0000) Pulse Rate:  [46-108] 100 (02/07 0731) Resp:  [3-39] 20 (02/07 0731) BP: (81-144)/(48-89) 106/71 (02/07 0731) SpO2:  [93 %-100 %] 100 % (02/07 0731) Arterial Line BP: (111-195)/(62-83) 163/73 (02/07 0600) Weight:  [71.2 kg] 71.2 kg (02/07 0600) Last BM Date: 04/17/20  Weight change: Filed Weights   04/17/20 0600 04/18/20 0600 04/19/20 0600  Weight: 67 kg 69.2 kg 71.2 kg    Intake/Output:   Intake/Output Summary (Last 24 hours) at 04/19/2020 0827 Last data filed at 04/19/2020 0600 Gross per 24 hour  Intake 2308.29 ml  Output 1770 ml  Net 538.29 ml      Physical Exam    General: Sleeping Neck: No JVD, no thyromegaly or thyroid nodule.  Lungs: Crackles bilaterally.  CV: Nondisplaced PMI.  Heart regular S1/S2, no S3/S4, no murmur.  No peripheral edema.   Abdomen: Soft, nontender, no hepatosplenomegaly, no distention.  Skin: Intact without lesions or rashes.  Neurologic: Sedated but will awaken.  Extremities: Dry gangrene digits left hand.  HEENT: Normal.    Telemetry   NSR 100s Personally reviewed   Labs    CBC Recent Labs    04/18/20 1657 04/18/20 2016 04/19/20 0411 04/19/20 0631  WBC 16.1*  --  16.3*  --   HGB  7.7*   < > 8.8* 10.2*  HCT 26.1*   < > 29.0* 30.0*  MCV 98.5  --  95.7  --   PLT 187  --  193  --    < > = values in this interval not displayed.   Basic Metabolic Panel Recent Labs    16/10/96 1607 04/18/20 1609 04/19/20 0411 04/19/20 0631  NA 145   < > 145 146*  K 3.7   < > 3.6 4.8  CL 104  --  107  --   CO2 29  --  27  --   GLUCOSE 372*  --  329*  --   BUN 86*  --  74*  --   CREATININE 0.94  --  0.79  --   CALCIUM 8.7*  --  8.8*  --    < > = values in this interval not displayed.   Liver Function Tests No results for input(s): AST, ALT,  ALKPHOS, BILITOT, PROT, ALBUMIN in the last 72 hours. No results for input(s): LIPASE, AMYLASE in the last 72 hours. Cardiac Enzymes No results for input(s): CKTOTAL, CKMB, CKMBINDEX, TROPONINI in the last 72 hours.  BNP: BNP (last 3 results) No results for input(s): BNP in the last 8760 hours.  ProBNP (last 3 results) No results for input(s): PROBNP in the last 8760 hours.   D-Dimer No results for input(s): DDIMER in the last 72 hours. Hemoglobin A1C No results for input(s): HGBA1C in the last 72 hours. Fasting Lipid Panel No results for input(s): CHOL, HDL, LDLCALC, TRIG, CHOLHDL, LDLDIRECT in the last 72 hours. Thyroid Function Tests No results for input(s): TSH, T4TOTAL, T3FREE, THYROIDAB in the last 72 hours.  Invalid input(s): FREET3  Other results:   Imaging    DG CHEST PORT 1 VIEW  Result Date: 04/19/2020 CLINICAL DATA:  50 year old male COVID-19.  ECMO. EXAM: PORTABLE CHEST 1 VIEW COMPARISON:  Portable chest 04/18/2020 and earlier. FINDINGS: Portable AP semi upright view at 0343 hours. Right PICC line, right side ECMO cannula, and enteric feeding tube appear stable. Continued widespread bilateral pulmonary opacity, with bilateral infrahilar air bronchograms. The left upper lung is least affected, and since yesterday right perihilar opacity has mildly increased. Small right pleural effusion was evident on CT 04/13/2020, but radiographically occult. Since 04/17/2020 ventilation in both lungs has decreased. Bowel gas in the left upper quadrant is stable and within normal limits. IMPRESSION: 1. Stable lines and tubes. 2. ARDS with continued widespread bilateral pulmonary opacity, mildly progressed about the right hilum since yesterday. Overall ventilation decreased since 04/17/2020. And small right pleural effusion noted on recent CT. Electronically Signed   By: Odessa Fleming M.D.   On: 04/19/2020 04:04   ECHOCARDIOGRAM COMPLETE  Result Date: 04/18/2020    ECHOCARDIOGRAM REPORT    Patient Name:   Alex Alex [name] Date of Exam: 04/18/2020 Medical Rec #:  [id number]      Height:       64.0 in Accession #:    [id number]     Weight:       152.6 lb Date of Birth:  September 06, 1970      BSA:          1.744 m Patient Age:    49 years       BP:           150/77 mmHg Patient Gender: M              HR:  109 bpm. Exam Location:  Inpatient Procedure: 2D Echo, Cardiac Doppler, Color Doppler and Intracardiac            Opacification Agent Indications:    endocarditis  History:        Patient has prior history of Echocardiogram examinations, most                 recent 04/01/2020. H/O Covid 19. On ECMO.  Sonographer:    Roosvelt Maser RDCS Referring Phys: [id number] Lorin Glass IMPRESSIONS  1. Left ventricular ejection fraction, by estimation, is 60 to 65%. The left ventricle has normal function. The left ventricle has no regional wall motion abnormalities. Left ventricular diastolic parameters were normal.  2. Right ventricular systolic function is normal. The right ventricular size is normal.  3. Left atrial size was severely dilated.  4. The pericardial effusion is posterior to the left ventricle.  5. The mitral valve is grossly normal. Trivial mitral valve regurgitation.  6. Tricuspid valve regurgitation is mild to moderate.  7. The aortic valve is tricuspid. Aortic valve regurgitation is not visualized. Comparison(s): Changes from prior study are noted. 04/01/20: LVEF 65-70%. Conclusion(s)/Recommendation(s): No evidence of valvular vegetations on this transthoracic echocardiogram. Would recommend a transesophageal echocardiogram to exclude infective endocarditis if clinically indicated. FINDINGS  Left Ventricle: Left ventricular ejection fraction, by estimation, is 60 to 65%. The left ventricle has normal function. The left ventricle has no regional wall motion abnormalities. Definity contrast agent was given IV to delineate the left ventricular  endocardial borders. The left ventricular internal cavity size  was normal in size. There is no left ventricular hypertrophy. Left ventricular diastolic parameters were normal. Right Ventricle: The right ventricular size is normal. No increase in right ventricular wall thickness. Right ventricular systolic function is normal. Left Atrium: Left atrial size was severely dilated. Right Atrium: Right atrial size was normal in size. Pericardium: Trivial pericardial effusion is present. The pericardial effusion is posterior to the left ventricle. Mitral Valve: The mitral valve is grossly normal. Trivial mitral valve regurgitation. Tricuspid Valve: The tricuspid valve is grossly normal. Tricuspid valve regurgitation is mild to moderate. Aortic Valve: The aortic valve is tricuspid. Aortic valve regurgitation is not visualized. Pulmonic Valve: The pulmonic valve was grossly normal. Pulmonic valve regurgitation is not visualized. Aorta: The aortic root and ascending aorta are structurally normal, with no evidence of dilitation. IAS/Shunts: No atrial level shunt detected by color flow Doppler.  LEFT VENTRICLE PLAX 2D LVIDd:         3.70 cm  Diastology LVIDs:         2.50 cm  LV e' medial:    9.90 cm/s LV PW:         1.10 cm  LV E/e' medial:  10.9 LV IVS:        1.10 cm  LV e' lateral:   9.36 cm/s LVOT diam:     1.90 cm  LV E/e' lateral: 11.5 LV SV:         48 LV SV Index:   28 LVOT Area:     2.84 cm  RIGHT VENTRICLE RV Basal diam:  3.50 cm RV S prime:     12.00 cm/s TAPSE (M-mode): 2.2 cm LEFT ATRIUM              Index       RIGHT ATRIUM           Index LA diam:        3.50 cm  2.01 cm/m  RA Area:     17.90 cm LA Vol (A2C):   95.7 ml  54.88 ml/m RA Volume:   51.20 ml  29.36 ml/m LA Vol (A4C):   95.3 ml  54.65 ml/m LA Biplane Vol: 103.0 ml 59.07 ml/m  AORTIC VALVE LVOT Vmax:   116.00 cm/s LVOT Vmean:  66.800 cm/s LVOT VTI:    0.171 m  AORTA Ao Root diam: 3.10 cm MITRAL VALVE                TRICUSPID VALVE MV Area (PHT): 4.17 cm     TR Peak grad:   32.7 mmHg MV Decel Time: 182 msec      TR Vmax:        286.00 cm/s MV E velocity: 108.00 cm/s MV A velocity: 90.80 cm/s   SHUNTS MV E/A ratio:  1.19         Systemic VTI:  0.17 m                             Systemic Diam: 1.90 cm Zoila Shutter MD Electronically signed by Zoila Shutter MD Signature Date/Time: 04/18/2020/12:36:57 PM    Final      Medications:     Scheduled Medications: . fentaNYL      . sodium chloride   Intravenous Once  . acetaZOLAMIDE  250 mg Per Tube BID  . carvedilol  25 mg Oral BID WC  . chlorhexidine  15 mL Mouth Rinse BID  . Chlorhexidine Gluconate Cloth  6 each Topical Daily  . chlorpheniramine-HYDROcodone  5 mL Per Tube Q12H  . clonazePAM  1 mg Oral QID  . cloNIDine  0.1 mg Per Tube Q8H  . dextromethorphan  30 mg Per Tube TID  . docusate  100 mg Per Tube BID  . fiber  1 packet Per Tube BID  . free water  200 mL Per Tube Q4H  . furosemide  40 mg Intravenous BID  . gabapentin  900 mg Per Tube Q8H  . insulin aspart  0-20 Units Subcutaneous Q4H  . insulin aspart  4 Units Subcutaneous Q4H  . insulin detemir  35 Units Subcutaneous BID  . ipratropium-albuterol  3 mL Nebulization QID  . lactobacillus acidophilus  2 tablet Per Tube TID  . liver oil-zinc oxide   Topical 5 X Daily  . mouth rinse  15 mL Mouth Rinse q12n4p  . melatonin  3 mg Per Tube QHS  . metoCLOPramide (REGLAN) injection  10 mg Intravenous Q8H  . nutrition supplement (JUVEN)  1 packet Per Tube BID BM  . oxyCODONE  15 mg Per Tube Q6H  . pantoprazole sodium  40 mg Per Tube QHS  . QUEtiapine  100-300 mg Per Tube QHS  . sennosides  10 mL Per Tube QHS  . sodium chloride flush  10-40 mL Intracatheter Q12H    Infusions: . sodium chloride    . sodium chloride Stopped (04/16/20 0934)  . sodium chloride 250 mL (04/07/20 2203)  . sodium chloride Stopped (03/12/20 0131)  . albumin human 12.5 g (04/11/20 1558)  . bivalirudin (ANGIOMAX) infusion 0.5 mg/mL (Non-ACS indications)    . feeding supplement (PIVOT 1.5 CAL) 70 mL/hr at 04/18/20  1700  . norepinephrine (LEVOPHED) Adult infusion Stopped (04/19/20 0146)  . vancomycin Stopped (04/18/20 2102)    PRN Medications: Place/Maintain arterial line **AND** sodium chloride, sodium chloride, sodium chloride, acetaminophen (TYLENOL) oral liquid 160 mg/5 mL, albumin human, [  DISCONTINUED] lidocaine **AND** albuterol, Gerhardt's butt cream, guaiFENesin, hydrALAZINE, labetalol, lip balm, midazolam, morphine, morphine CONCENTRATE, ondansetron (ZOFRAN) IV, phenol, polyethylene glycol, simethicone, sodium chloride flush   Assessment/Plan   1. Acute hypoxemic respiratory failure: Due to COVID-19 PNA with bilateral infiltrates.  Refractory hypoxemia, VV-ECMO cannulation on 03/01/2020 with improvement in oxygenation.  Developed left PTX post-subclavian CVL and had left chest tube, the left lung is re-expanded and CT out.  He was extubated 1/2 but reintubated 1/4 with agitation and suspected aspiration.  Tracheostomy 1/6.  CTA chest 1/22 with suspected chronic PEs and ARDS. ECMO cannula repositioned 1/5. ECMO circuit changed 1/26. Extubated to HFNC on 2/5. LDH stable. Sweep at 4, have struggled with hypercarbia from significant dead space ventilation.  Restarted abx 2/5 with sepsis, Enterococcus faecalis, now on vancomycin.  He had 1 dose IV Lasix yesterday. CXR is a bit worse with extubation and loss of PEEP.  - Continue to try to wean sweep, will allow lower pH.  - Lasix 40 mg IV bid today.  - On vancomycin with Enterococcal.    - Patient has had remdesivir, tocilizumab. - Completed steroid taper. - Continue bivalirudin, goal PTT 65-80.  Discussed dosing with PharmD personally.  - Continue to mobilize  - Coreg increased to 25 mg bid to increase fractional flow via ECMO circuit. Diltiazem was also added but will stop today with lower BP. .  - Will need to consider for lung transplantation if no improvement.  2. 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  Clot noted on TEE in RV as well.  TTE 1/2 showed normal EF 60-65%, RV improved (mildly dilated/dysfunctional). TEE on 1/5 with moderate to severe RV dysfunction but patient was hypoxemic.  Had 1/2 dose TPA on 1/5. Echo 1/20 with mildly dilated/mildly dysfunctional RV. CTA chest 1/22 with chronic-appearing PEs in upper lobes. - Bivalirudin for goal PTT 65-80.  Discussed dosing with PharmD personally. 3. Left PTX: Left chest tube, lung is re-expanded. Tube now out, stable CXR. 4. Shock: Suspect septic/distributive.  Now resolved, off NE.  5. Anemia: Hgb 8.8, transfuse < 8.    6. AKI: Stable renal indices, follow closely.   7. Hyperglycemia: insulin.  8. HTN: BP lower today.    - On clonidine.  - Continue Coreg 25 mg bid.   - Stop diltiazem with lower BP.  - Suspect arterial line inaccurate, following cuff for now.  9. CHB: Episode of CHB when hypoxemic and with cough (suspect vagal).  NSR since then.    - Continue Coreg, watch rhythm.  10. Thrombocytopenia: Resolved  11. Ileus: Resolved. TFs restarted.  - Getting Reglan  - Cor-trak repositioned to post-pyloric placement 12. Ischemic digits: LUE.  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  Repeat study 1/17 showed no obstruction.  - Wound Care following. 13. ID: Group F Strep and Enterococcus faecalis in sputum. Initially completed abx for these bacteria. Now with recurrent sepsis, Enterococcus faecalis in blood now. Vancomycin restarted. TTE yesterday did not show definite vegetation.  - Continue vancomycin. - TEE today.     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   15. Hypernatremia: Continue free water.   CRITICAL CARE Performed by: Marca Ancona  Total critical care time: 40 minutes  Critical care time was exclusive of separately billable procedures and treating other patients.  Critical care was  necessary to treat or  prevent imminent or life-threatening deterioration.  Critical care was time spent personally by me on the following activities: development of treatment plan with patient and/or surrogate as well as nursing, discussions with consultants, evaluation of patient's response to treatment, examination of patient, obtaining history from patient or surrogate, ordering and performing treatments and interventions, ordering and review of laboratory studies, ordering and review of radiographic studies, pulse oximetry and re-evaluation of patient's condition.    Length of Stay: 66  Marca Ancona, MD  04/19/2020, 8:27 AM  Advanced Heart Failure Team Pager [phone] (M-F; 7a - 4p)  Please contact [facility] for night-coverage after hours (4p -7a ) and weekends on [email or web address]

## 2020-04-19 NOTE — Procedures (Signed)
Extracorporeal support note  ECLS cannulation date: 2020/03/15 Last circuit change: 04/07/2020  Indication: Acute hypoxic respiratory failure due to ARDS from COVID-19 pneumonia with RV dysfunction.   Configuration: Venovenous  Drainage cannula: 32 French crescent cannula via right IJ Return cannula: Same  Pump speed: 3450 RPM Pump flow: 4.6 L/min Pump used: Cardio help  Oxygenator: Cardio help O2 blender: 100% Sweep gas: 8L  Circuit check: minimal clots in circuit Anticoagulant: Bivalirudin Anticoagulation targets: PTT 60-80  Changes in support:  TEE today, change PICC. Con't antibiotics.  Anticipated goals/duration of support: Bridge to recovery.  Multidisciplinary ECMO rounds completed.  Steffanie Dunn, DO 04/19/20 7:38 PM [facility]

## 2020-04-19 NOTE — Progress Notes (Signed)
  Echocardiogram Echocardiogram Transesophageal has been performed.  Alex [name] 04/19/2020, 10:40 AM

## 2020-04-19 NOTE — Progress Notes (Signed)
[facility]   Reason for visit: Follow up on bacteremia  Interval History: TEE with no vegetation.  WBC down to 16.3, remains afebrile.  Remains on ECMO.   Day 3 antibiotics Day 3 vancomycin  Physical Exam: Constitutional:  Vitals:   04/19/20 0857 04/19/20 0922  BP: 132/84   Pulse: (!) 105   Resp:    Temp:    SpO2:  97%   patient appears in NAD Eyes: anicteric HENT: + facemask Respiratory: increased respiratory effort Cardiovascular: tachy RR GI: soft, nt, nd Lines: ecmo line right neck picc right arm, c/d/i  Review of Systems: Unable to be assessed due to patient factors  Lab Results  Component Value Date   WBC 16.3 (H) 04/19/2020   HGB 10.2 (L) 04/19/2020   HCT 30.0 (L) 04/19/2020   MCV 95.7 04/19/2020   PLT 193 04/19/2020    Lab Results  Component Value Date   CREATININE 0.79 04/19/2020   BUN 74 (H) 04/19/2020   NA 146 (H) 04/19/2020   K 4.8 04/19/2020   CL 107 04/19/2020   CO2 27 04/19/2020    Lab Results  Component Value Date   ALT 32 04/11/2020   AST 19 04/11/2020   ALKPHOS 143 (H) 04/11/2020     Microbiology: Recent Results (from the past 240 hour(s))  Culture, respiratory (non-expectorated)     Status: None   Collection Time: 04/13/20  5:23 PM   Specimen: Tracheal Aspirate; Respiratory  Result Value Ref Range Status   Specimen Description TRACHEAL ASPIRATE  Final   Special Requests NONE  Final   Gram Stain   Final    MODERATE WBC PRESENT, PREDOMINANTLY PMN FEW GRAM POSITIVE RODS RARE GRAM POSITIVE COCCI    Culture   Final    FEW Normal respiratory flora-no Staph aureus or Pseudomonas seen Performed at [facility], [address], [place], [place] [address]    Report Status 04/16/2020 FINAL  Final  Culture, blood (routine x 2)     Status: Abnormal   Collection Time: 04/17/20  8:01 AM   Specimen: BLOOD LEFT HAND  Result Value Ref Range Status   Specimen Description BLOOD LEFT HAND  Final   Special  Requests   Final    BOTTLES DRAWN AEROBIC ONLY Blood Culture adequate volume   Culture  Setup Time   Final    GRAM POSITIVE COCCI IN CHAINS IN PAIRS AEROBIC BOTTLE ONLY CRITICAL VALUE NOTED.  VALUE IS CONSISTENT WITH PREVIOUSLY REPORTED AND CALLED VALUE.    Culture (A)  Final    ENTEROCOCCUS FAECALIS SUSCEPTIBILITIES PERFORMED ON PREVIOUS CULTURE WITHIN THE LAST 5 DAYS. Performed at [facility], [address] [place], [place] [address]    Report Status 04/19/2020 FINAL  Final  Culture, blood (routine x 2)     Status: Abnormal   Collection Time: 04/17/20  8:11 AM   Specimen: BLOOD LEFT WRIST  Result Value Ref Range Status   Specimen Description BLOOD LEFT WRIST  Final   Special Requests   Final    BOTTLES DRAWN AEROBIC AND ANAEROBIC Blood Culture adequate volume   Culture  Setup Time   Final    GRAM POSITIVE COCCI IN CHAINS IN BOTH AEROBIC AND ANAEROBIC BOTTLES CRITICAL RESULT CALLED TO, READ BACK BY AND VERIFIED WITH: Sheppard Coil PHARMD @2016  04/17/20 EB Performed at [facility], [address], [place], [place]    Culture ENTEROCOCCUS FAECALIS (A)  Final  Report Status 04/19/2020 FINAL  Final   Organism ID, Bacteria ENTEROCOCCUS FAECALIS  Final      Susceptibility   Enterococcus faecalis - MIC*    AMPICILLIN <=2 SENSITIVE Sensitive     VANCOMYCIN 1 SENSITIVE Sensitive     GENTAMICIN SYNERGY SENSITIVE Sensitive     * ENTEROCOCCUS FAECALIS  Blood Culture ID Panel (Reflexed)     Status: Abnormal   Collection Time: 04/17/20  8:11 AM  Result Value Ref Range Status   Enterococcus faecalis DETECTED (A) NOT DETECTED Final    Comment: CRITICAL RESULT CALLED TO, READ BACK BY AND VERIFIED WITH: Sheppard Coil PHARMD @2016  04/17/20 EB    Enterococcus Faecium NOT DETECTED NOT DETECTED Final   Listeria monocytogenes NOT DETECTED NOT DETECTED Final   Staphylococcus species NOT DETECTED NOT DETECTED Final   Staphylococcus aureus (BCID) NOT DETECTED NOT DETECTED  Final   Staphylococcus epidermidis NOT DETECTED NOT DETECTED Final   Staphylococcus lugdunensis NOT DETECTED NOT DETECTED Final   Streptococcus species NOT DETECTED NOT DETECTED Final   Streptococcus agalactiae NOT DETECTED NOT DETECTED Final   Streptococcus pneumoniae NOT DETECTED NOT DETECTED Final   Streptococcus pyogenes NOT DETECTED NOT DETECTED Final   A.calcoaceticus-baumannii NOT DETECTED NOT DETECTED Final   Bacteroides fragilis NOT DETECTED NOT DETECTED Final   Enterobacterales NOT DETECTED NOT DETECTED Final   Enterobacter cloacae complex NOT DETECTED NOT DETECTED Final   Escherichia coli NOT DETECTED NOT DETECTED Final   Klebsiella aerogenes NOT DETECTED NOT DETECTED Final   Klebsiella oxytoca NOT DETECTED NOT DETECTED Final   Klebsiella pneumoniae NOT DETECTED NOT DETECTED Final   Proteus species NOT DETECTED NOT DETECTED Final   Salmonella species NOT DETECTED NOT DETECTED Final   Serratia marcescens NOT DETECTED NOT DETECTED Final   Haemophilus influenzae NOT DETECTED NOT DETECTED Final   Neisseria meningitidis NOT DETECTED NOT DETECTED Final   Pseudomonas aeruginosa NOT DETECTED NOT DETECTED Final   Stenotrophomonas maltophilia NOT DETECTED NOT DETECTED Final   Candida albicans NOT DETECTED NOT DETECTED Final   Candida auris NOT DETECTED NOT DETECTED Final   Candida glabrata NOT DETECTED NOT DETECTED Final   Candida krusei NOT DETECTED NOT DETECTED Final   Candida parapsilosis NOT DETECTED NOT DETECTED Final   Candida tropicalis NOT DETECTED NOT DETECTED Final   Cryptococcus neoformans/gattii NOT DETECTED NOT DETECTED Final   Vancomycin resistance NOT DETECTED NOT DETECTED Final    Comment: Performed at [facility], [address], [place], [place]    Impression/Plan:  1. Enterococcal bacteremia - he remains on vancomycin and a repeat blood culture sent today.  TEE without vegetation.  Will continue with vancomycin.    2.  ECMO - ampicillin not  viable on ECMO so will not narrow antibiotics for #1.  Continue vancomycin.   3.  Leukocytosis - improving since starting antibiotics.  Will continue as above.

## 2020-04-19 NOTE — Plan of Care (Signed)
  Problem: Health Behavior/Discharge Planning: Goal: Ability to manage health-related needs will improve Outcome: Progressing   Problem: Clinical Measurements: Goal: Ability to maintain clinical measurements within normal limits will improve Outcome: Progressing Goal: Will remain free from infection Outcome: Progressing Goal: Diagnostic test results will improve Outcome: Progressing Goal: Respiratory complications will improve Outcome: Progressing Goal: Cardiovascular complication will be avoided Outcome: Progressing   Problem: Activity: Goal: Risk for activity intolerance will decrease Outcome: Progressing   Problem: Coping: Goal: Level of anxiety will decrease Outcome: Progressing   Problem: Pain Managment: Goal: General experience of comfort will improve Outcome: Progressing   Problem: Elimination: Goal: Will not experience complications related to bowel motility Outcome: Progressing Goal: Will not experience complications related to urinary retention Outcome: Progressing   

## 2020-04-19 NOTE — Progress Notes (Signed)
SLP Cancellation Note  Patient Details Name: Alex [name] MRN: [id number] DOB: 11-01-1970   Cancelled treatment:       Reason Eval/Treat Not Completed: Medical issues which prohibited therapy. Pt now decannulated, but still with active orders to assess swallowing whenever pt may be medically ready. Per RN, this morning he was having "air hunger" and received sedation for TEE. Will f/u as able for readiness.     Mahala Menghini., M.A. CCC-SLP Acute Rehabilitation Services Pager [phone] Office [phone]  04/19/2020, 11:07 AM

## 2020-04-19 NOTE — Progress Notes (Signed)
OT Note Nsg reports pt dealing with "air hunger" this am. Pt on BiPap and scheduled to have TEE this am. Will return this pm if able. Tubular guaze appears to be working well for L hand. Contacted [employer] to order additional box of Tube[employer]auz. Elastic Net - tubular dressing retainer. Size #4. Lot # [id number]. Luisa Dago, OT/L   Acute OT Clinical Specialist Acute Rehabilitation Services Pager [phone] Office [phone]

## 2020-04-19 NOTE — CV Procedure (Signed)
Procedure: TEE  Sedation: Per CCM  Indication: Endocarditis  Findings: Please see echo section for full report.  Normal LV size with mild LV hypertrophy.  EF 55-60%.  Normal right ventricular size with low normal systolic function.  Normal right atrial size.  ECMO catheter noted in the right atrium with flow directed across tricuspid valve.  PICC line enters the RA adjacent to ECMO catheter.  Trivial tricuspid regurgitation, no vegetation. No pulmonary valve vegetation.  There is a small PFO noted by color doppler.  Trivial mitral regurgitation, no vegetation.  Tricuspid aortic valve, no vegetation.  No AS, trivial AI.  Normal caliber thoracic aorta with minimal plaque.    Impression: No evidence for endocarditis.   Alex [name] 04/19/2020 10:20 AM

## 2020-04-19 NOTE — Progress Notes (Signed)
ECMO PROGRESS NOTE  NAME:  Alex [name], MRN:  [id number], DOB:  [birth year], LOS: 41 ADMISSION DATE:  30-Mar-2020, CONSULTATION DATE: 03/01/2020 REFERRING MD: Wynona Neat -LBPCCM, CHIEF COMPLAINT: Respiratory failure requiring ECMO  HPI/course in hospital  50 year old man admitted to hospital 12/28 with 1 week history of dyspnea cough nausea and vomiting.  Initially admitted to [facility] long hospital and placed on high flow nasal cannula but rapidly failed and required intubation 12/29.  Persistent hypoxic respiratory failure with PF ratio 55 in spite of 18 of PEEP FiO2 0.1 despite paralytics.  Did not improve with prone ventilation  Cannulated for VV ECMO 12/30 via right IJ crescent cannula.  ECMO circuit was changed on 04/07/2020 Iatrogenic pneumothorax from left subclavian triple-lumen placement  12/28 admitted covid, ARDS 12/30 ECMO cannulation, iatrogenic pneumothrorax, DVT, Bivalrudin started           Left subclavian hematoma, chest tube, ceftriaxone/ azithromycin started 12/31 1Unit PRBC 1/1 Palliative consult, noted HTN, fentanyl only 1/2 Extubation I/O positive, left lung re-expanded, EF 60-65%, Lasix 20mg  BID 1/3 BiPAP in place, HTN to 190s, 200s, labetolol for BP, I/O even, cefepime and vancomycin started 1/4 agitation off BiPAP, possible aspiration 1/5 agitation overnight, reintubated      ECHO with RV dilation, ECMO cannula repositioned, LUE DVT (+), 1/2 dose tPA given,      I/O up, on lasix, Epi, NE, vasopressin started, HR/ rhythm issues noted 1/6 Tracheostomy, hypoglycemic when off tube feeds       Cortrack tune issues 1/7 on lasix gtt 4mg /hr with diuresis, agitation improved 1/8 starting steroid taper 1/9 severe agitation, heavy sedation required, lasix gtt 6mg /hr, I/Os (-)       precedex and fentanyl 1/10 Vanc stopped 1/11 sweep to 2, lasix gtt at 4mg /hr, weight up, metoprolol added for HTN 1/12 fevers to 99 noted, lasix gtt and metazolone 1/13 lasix gtt dced 1/14  intermittent HTN, possibly flash pulmonary edema tied to sedation        Ischemic left hand changes, 1 Unit PRBCs 1/15 1/16 sweep from 4 to 2.5, chest tube removed 1/17 sweep at 6, trial of nebulized morphine for cough, LUE dopplers show no obstruction 1/18 two events of 2nd degree type II HB noted with coughing         sweep at 4, IV lasix 40, acetazolamide, distal XLT trach placement         agitation and BP spikes 1/19 agitation, air hunger, seroquel, klonopin, oxy, valproate, ketamine trial 1/20 sweep at 3.5, diuresis results in chugging, albumin given         1 Unit PRBCs, lidocaine nebulizers for cough, ancef started 1/21 sweep to 8, anxiety, I/Os (+) with lasix and acetazolamide 1/22 sweep at 5, chest CT bilateral upper lobe PEs 1/23 sweep at 5, delirium, I/Os even with lasix and acetazolamide         continued coughing, 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 1/24 sweep at 7.5, 1 Unit PRBCs, repeat bronchoscopy showed resolution of 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 1/25 sweep at 7.5, on propofol, lasix gtt at 37ml/hr, I/Os (+)         nebulized morphine and lidocaine for cough 1/26 sweep at 8, propofol/ precedex, cough present when weaned, lasix gtt at 8ml/hr        ECMO circuit changes, levophed started, 1 Unit PRBCs 1/27 sweep at 4, off sedation, delirium, I/Os even        lasix gtt restated, acetazolamide overnight 1/28 sweep at 7.5, HTN  labile 1/29 sweep of 6, patient reports a tickle in throat, cetacaine 1/30 sweep down to 6, cough improved with cetacaine and gabapentin 1/31 sweep at 5, agitation, off acetazolamide, lasix, weight up 2/1 cough, of pulmicort, cetacaine for cough, continued gabapentin, dexmethorphan       CT demonstrated trach placement against back of trachea 2/2 Trach collar trial somewhat effective in managing cough       low dose diltiazem for HR,  2/3 sweep at 4,trach cannula replacement (longer, more flexible bivona); brochoscopy shows  irritated mucosa       IV lasix and acetazolamide 2/4 sweep at 5, I/Os (+), BUN elevated, lasix gtt with diamox,       trach decannulation, increased gabapentin for cough 2/5 start HCAP coverage for rising WBC/temps, check Pct/cultures, CXR worse with lack of PEEP, diuresed well but started getting very hypotensive, sepsis vs. Too dry 04/18/20 blood cx grew E faecalis 2/7 PICC change, TEE  Past Medical History  none  Interim history/subjective:  No acute events. Sleeping more, denies complaints. On ECMO, nasal cannula  Objective   Blood pressure 122/80, pulse 100, temperature 98.7 F (37.1 C), temperature source Axillary, resp. rate (!) 34, height 5\' 4"  (1.626 m), weight 71.2 kg, SpO2 94 %.        Intake/Output Summary (Last 24 hours) at 04/19/2020 06/17/2020 Last data filed at 04/19/2020 0600 Gross per 24 hour  Intake 2424.9 ml  Output 1770 ml  Net 654.9 ml   Filed Weights   04/17/20 0600 04/18/20 0600 04/19/20 0600  Weight: 67 kg 69.2 kg 71.2 kg    Examination: Constitutional: ill appearing man lying in bed no acute distress Eyes: Eyes anicteric, oral mucosa moist Ears, nose, mouth, and throat: Trach site nearly closed, no bleeding or drainage Cardiovascular: Tachycardic, regular rhythm, no murmurs Respiratory: Tachypnea, rales bilaterally, moderate strength cough Gastrointestinal: Soft, nontender, nondistended Skin: No rashes or wounds Neurologic: Moves all extremities, mostly sleeping but arousable with stimulation Psychiatric: Cooperative with exam     Net +0.7L CXR> worse opacities, esp bases  Assessment & Plan:  Acute hypoxemic/hypercapnic respiratory failure due to severe ARDS from COVID-19 pneumonia Probable acute PE, and RV dysfunction s/p TPA Refractory coughing- improved after trach removal but still an issue Strep group F/enterococcal pneumonia- s/p 10 days vanc 1/29 - Continue VV ECMO support, wean sweep as able, but limited with poor respiratory excursion -  Current cough regimen:  dextromethorphan, gabapentin - supplemental O2 via [place]; maintain of oxygenation is ECMO circuit - unable to replace trach for TEE; done with ketamine.  HTN - Trials of reducing HR/BP have resulted in reduced pCO2 (likely related to fractional flow through ECMO circuit) - continue increased coreg. No longer on diltiazem; had to hold 2/5 due to hypotension -con't clonidine  Acute delirium, with agitation- improved -  RASS goal -1 to 0, PRN, morphine PRN, oxycodone 15mg  q6h, qHS seroquel - Change kloninpin to 1mg  qid - some meds on hold since he is so somnolent 2/2 sepsis  Sepsis secondary to E faecalis bacteremia- secondary to PNA, has grown enterococcus in sputum prior, previous vanc course completed 1/29 - Continue vanc & ampicillin - appreciate ID recs - TTE today  Gastroparesis with some element ileus: improved but occasional vomiting spells with coughing attacks - Continue reglan, con't bowel regimen (miralax, docusate, fiberpack), QTc okay 1/31   Ischemic changes L hand; 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  - wound following, appreciate dressing  recommendations - surgical consult for L hand once more stable -keep Aline out of left arm  Poorly controlled diabetes type II -Continue levemir, SSI; levemir was not given yesterday morning due to hypoglycemia, will reduce to 38 units BID -increasing TF coverage  Acute urinary retention - On condom cath trial; may need foley replaced - Hold on bethenacol (off since 1/30)  Hypernatremia, fluid balance- goal even with balanced diuresis-- resolved -con't to monitor   Daily Goals Checklist  Pain/Anxiety/Delirium protocol (if indicated): see above VAP protocol (if indicated): bundle in place.  DVT prophylaxis: bivalirudin GI prophylaxis: pantoprazole Glucose control: Euglcyemic on combination of SSI and Basal insulin. Mobility/therapy needs: Mobilization as  tolerated, appreciate PT help Code Status: Full code Disposition: ICU.    This patient is critically ill with multiple organ system failure which requires frequent high complexity decision making, assessment, support, evaluation, and titration of therapies. This was completed through the application of advanced monitoring technologies and extensive interpretation of multiple databases. During this encounter critical care time was devoted to patient care services described in this note for 45 minutes.   Steffanie Dunn, DO 04/19/20 7:47 PM [facility]  From 7AM- 7PM if no response to pager, please call [phone]. After hours, 7PM- 7AM, please call Elink  [phone].

## 2020-04-19 NOTE — Progress Notes (Signed)
Peripherally Inserted Central Catheter Placement  The IV Nurse has discussed with the patient and/or persons authorized to consent for the patient, the purpose of this procedure and the potential benefits and risks involved with this procedure.  The benefits include less needle sticks, lab draws from the catheter, and the patient may be discharged home with the catheter. Risks include, but not limited to, infection, bleeding, blood clot (thrombus formation), and puncture of an artery; nerve damage and irregular heartbeat and possibility to perform a PICC exchange if needed/ordered by physician.  Alternatives to this procedure were also discussed.  Bard Power PICC patient education guide, fact sheet on infection prevention and patient information card has been provided to patient /or left at bedside.    PICC Placement Documentation  PICC Triple Lumen 04/11/20 PICC Right Brachial 39 cm 1 cm (Active)  Indication for Insertion or Continuance of Line Prolonged intravenous therapies 04/18/20 2000  Exposed Catheter (cm) 1 cm 04/17/20 2105  Site Assessment Clean;Dry;Intact 04/18/20 2000  Lumen #1 Status Infusing 04/18/20 2000  Lumen #2 Status Infusing 04/18/20 2000  Lumen #3 Status Flushed;Saline locked 04/18/20 2000  Dressing Type Transparent;Securing device 04/18/20 2000  Dressing Status Clean;Dry;Intact 04/18/20 2000  Antimicrobial disc in place? Yes 04/18/20 2000  Safety Lock Intact 04/18/20 2000  Line Care Connections checked and tightened 04/18/20 2000  Line Adjustment (NICU/IV Team Only) No 04/17/20 2105  Dressing Intervention Dressing changed;Antimicrobial disc changed 04/18/20 0500  Dressing Change Due 04/25/20 04/18/20 0800     PICC Triple Lumen 04/19/20 PICC Left Brachial 41 cm 0 cm (Active)  Indication for Insertion or Continuance of Line Vasoactive infusions 04/19/20 1300  Exposed Catheter (cm) 0 cm 04/19/20 1300  Site Assessment Clean;Dry;Intact 04/19/20 1300  Lumen #1 Status  Flushed;Blood return noted 04/19/20 1300  Lumen #2 Status Flushed;Blood return noted 04/19/20 1300  Lumen #3 Status Flushed;Blood return noted 04/19/20 1300  Dressing Type Transparent 04/19/20 1300  Dressing Status Clean;Dry;Intact 04/19/20 1300  Antimicrobial disc in place? Yes 04/19/20 1300  Dressing Intervention New dressing 04/19/20 1300  Dressing Change Due 04/26/20 04/19/20 1300       Stacie Glaze Horton 04/19/2020, 1:40 PM

## 2020-04-19 NOTE — Progress Notes (Signed)
ANTICOAGULATION CONSULT NOTE  Pharmacy Consult for bivalirudin Indication: ECMO and VTE  Labs: Recent Labs    04/17/20 0435 04/17/20 1157 04/18/20 0416 04/18/20 0422 04/18/20 1607 04/18/20 1609 04/18/20 1657 04/18/20 2016 04/19/20 0411 04/19/20 0631 04/19/20 1201 04/19/20 1637 04/19/20 1640  HGB 9.9*   < > 8.7*   < > 7.9*   < > 7.7*   < > 8.8*   < > 9.9* 9.2* 10.2*  HCT 33.0*   < > 28.4*   < > 26.9*   < > 26.1*   < > 29.0*   < > 29.0* 31.0* 30.0*  PLT 262   < > 206  --  196  --  187  --  193  --   --  226  --   APTT 67*   < > 76*  --  70*  --   --   --  61*  --   --  68*  --   LABPROT 16.9*  --  19.3*  --   --   --   --   --  17.9*  --   --   --   --   INR 1.4*  --  1.7*  --   --   --   --   --  1.5*  --   --   --   --   CREATININE 0.73   < > 0.95  --  0.94  --   --   --  0.79  --   --  0.80  --    < > = values in this interval not displayed.    Assessment: 24 yoM admitted with COVID-19 PNA with worsening hypoxia, s/p cannulation for ECMO. Pt was started on IV heparin prior to cannulation due to acute DVTs and possible PE, transitioned to bivalirudin with ECMO. Now s/p tPA on 1/5 and tracheostomy on 1/6. Circuit last changed 1/26.  APTT this evening continues to be therapeutic at 68 seconds, H/H stable. LDH and fibrinogen remain stable.  Goal of Therapy:  aPTT 60-80 seconds   Plan:  -bivalirudin at 0.08 mg/kg/hr (using order-specific wt 72.1kg) -Monitor q12h aPTT/CBC, LDH, and for s/sx of bleeding  Sheppard Coil PharmD., BCPS Clinical Pharmacist 04/19/2020 5:57 PM

## 2020-04-19 NOTE — Progress Notes (Signed)
PT Cancellation Note  Patient Details Name: Alex [name] MRN: [id number] DOB: [birth year]   Cancelled Treatment:    Reason Eval/Treat Not Completed: Medical issues which prohibited therapy (Nsg reports pt dealing with "air hunger" this am. Pt on BiPap and scheduled to have TEE this am. Will return this pm if able.)   Berline Lopes 04/19/2020, 11:09 AM Ashleigh Luckow W,PT Acute Rehabilitation Services Pager:  [phone]  Office:  [phone]

## 2020-04-19 NOTE — Progress Notes (Signed)
Inpatient Diabetes Program Recommendations  AACE/ADA: New Consensus Statement on Inpatient Glycemic Control (2015)  Target Ranges:  Prepandial:   less than 140 mg/dL      Peak postprandial:   less than 180 mg/dL (1-2 hours)      Critically ill patients:  140 - 180 mg/dL   Lab Results  Component Value Date   GLUCAP 303 (H) 04/19/2020   HGBA1C 11.8 (H) Mar 23, 2020    Review of Glycemic Control Results for Alex [name], Alex [name] (MRN [id number]) as of 04/19/2020 10:26  Ref. Range 04/18/2020 19:56 04/19/2020 00:00 04/19/2020 04:07 04/19/2020 07:39  Glucose-Capillary Latest Ref Range: 70 - 99 mg/dL 884 (H) 166 (H) 063 (H) 303 (H)   Current orders for Inpatient glycemic control:  Novolog resistant q 4 hours Pivot 70 cc/hr Novolog 4 units q 4 hours (tube feed coverage) Levemir 38 units q 12 hours Inpatient Diabetes Program Recommendations:    Consider increasing Novolog tube feed coverage to 8 units q 4 hours.   Thanks,  Beryl Meager, RN, BC-ADM Inpatient Diabetes Coordinator Pager [phone] (8a-5p)

## 2020-04-20 ENCOUNTER — Inpatient Hospital Stay (HOSPITAL_COMMUNITY): Payer: Medicaid Other

## 2020-04-20 DIAGNOSIS — R7881 Bacteremia: Secondary | ICD-10-CM

## 2020-04-20 DIAGNOSIS — J9602 Acute respiratory failure with hypercapnia: Secondary | ICD-10-CM

## 2020-04-20 DIAGNOSIS — L89152 Pressure ulcer of sacral region, stage 2: Secondary | ICD-10-CM

## 2020-04-20 DIAGNOSIS — I2699 Other pulmonary embolism without acute cor pulmonale: Secondary | ICD-10-CM

## 2020-04-20 DIAGNOSIS — B952 Enterococcus as the cause of diseases classified elsewhere: Secondary | ICD-10-CM

## 2020-04-20 DIAGNOSIS — Z9281 Personal history of extracorporeal membrane oxygenation (ECMO): Secondary | ICD-10-CM

## 2020-04-20 DIAGNOSIS — R5381 Other malaise: Secondary | ICD-10-CM

## 2020-04-20 DIAGNOSIS — I96 Gangrene, not elsewhere classified: Secondary | ICD-10-CM

## 2020-04-20 LAB — POCT I-STAT 7, (LYTES, BLD GAS, ICA,H+H)
Acid-Base Excess: 4 mmol/L — ABNORMAL HIGH (ref 0.0–2.0)
Acid-Base Excess: 1 mmol/L (ref 0.0–2.0)
Acid-Base Excess: 3 mmol/L — ABNORMAL HIGH (ref 0.0–2.0)
Acid-Base Excess: 4 mmol/L — ABNORMAL HIGH (ref 0.0–2.0)
Acid-Base Excess: 6 mmol/L — ABNORMAL HIGH (ref 0.0–2.0)
Bicarbonate: 30.3 mmol/L — ABNORMAL HIGH (ref 20.0–28.0)
Bicarbonate: 28.2 mmol/L — ABNORMAL HIGH (ref 20.0–28.0)
Bicarbonate: 29.4 mmol/L — ABNORMAL HIGH (ref 20.0–28.0)
Bicarbonate: 30.6 mmol/L — ABNORMAL HIGH (ref 20.0–28.0)
Bicarbonate: 30.6 mmol/L — ABNORMAL HIGH (ref 20.0–28.0)
Calcium, Ion: 1.31 mmol/L (ref 1.15–1.40)
Calcium, Ion: 1.3 mmol/L (ref 1.15–1.40)
Calcium, Ion: 1.32 mmol/L (ref 1.15–1.40)
Calcium, Ion: 1.33 mmol/L (ref 1.15–1.40)
Calcium, Ion: 1.3 mmol/L (ref 1.15–1.40)
HCT: 26 % — ABNORMAL LOW (ref 39.0–52.0)
HCT: 27 % — ABNORMAL LOW (ref 39.0–52.0)
HCT: 27 % — ABNORMAL LOW (ref 39.0–52.0)
HCT: 27 % — ABNORMAL LOW (ref 39.0–52.0)
HCT: 26 % — ABNORMAL LOW (ref 39.0–52.0)
Hemoglobin: 8.8 g/dL — ABNORMAL LOW (ref 13.0–17.0)
Hemoglobin: 9.2 g/dL — ABNORMAL LOW (ref 13.0–17.0)
Hemoglobin: 9.2 g/dL — ABNORMAL LOW (ref 13.0–17.0)
Hemoglobin: 9.2 g/dL — ABNORMAL LOW (ref 13.0–17.0)
Hemoglobin: 8.8 g/dL — ABNORMAL LOW (ref 13.0–17.0)
O2 Saturation: 94 %
O2 Saturation: 91 %
O2 Saturation: 95 %
O2 Saturation: 89 %
O2 Saturation: 90 %
Patient temperature: 36.9
Patient temperature: 36.9
Patient temperature: 98.6
Potassium: 3.6 mmol/L (ref 3.5–5.1)
Potassium: 4.2 mmol/L (ref 3.5–5.1)
Potassium: 3.6 mmol/L (ref 3.5–5.1)
Potassium: 4.1 mmol/L (ref 3.5–5.1)
Potassium: 3.8 mmol/L (ref 3.5–5.1)
Sodium: 148 mmol/L — ABNORMAL HIGH (ref 135–145)
Sodium: 148 mmol/L — ABNORMAL HIGH (ref 135–145)
Sodium: 148 mmol/L — ABNORMAL HIGH (ref 135–145)
Sodium: 147 mmol/L — ABNORMAL HIGH (ref 135–145)
Sodium: 146 mmol/L — ABNORMAL HIGH (ref 135–145)
TCO2: 32 mmol/L (ref 22–32)
TCO2: 30 mmol/L (ref 22–32)
TCO2: 31 mmol/L (ref 22–32)
TCO2: 32 mmol/L (ref 22–32)
TCO2: 32 mmol/L (ref 22–32)
pCO2 arterial: 52.6 mmHg — ABNORMAL HIGH (ref 32.0–48.0)
pCO2 arterial: 59.4 mmHg — ABNORMAL HIGH (ref 32.0–48.0)
pCO2 arterial: 54.7 mmHg — ABNORMAL HIGH (ref 32.0–48.0)
pCO2 arterial: 55.7 mmHg — ABNORMAL HIGH (ref 32.0–48.0)
pCO2 arterial: 45.7 mmHg (ref 32.0–48.0)
pH, Arterial: 7.369 (ref 7.350–7.450)
pH, Arterial: 7.284 — ABNORMAL LOW (ref 7.350–7.450)
pH, Arterial: 7.339 — ABNORMAL LOW (ref 7.350–7.450)
pH, Arterial: 7.347 — ABNORMAL LOW (ref 7.350–7.450)
pH, Arterial: 7.434 (ref 7.350–7.450)
pO2, Arterial: 75 mmHg — ABNORMAL LOW (ref 83.0–108.0)
pO2, Arterial: 70 mmHg — ABNORMAL LOW (ref 83.0–108.0)
pO2, Arterial: 82 mmHg — ABNORMAL LOW (ref 83.0–108.0)
pO2, Arterial: 61 mmHg — ABNORMAL LOW (ref 83.0–108.0)
pO2, Arterial: 58 mmHg — ABNORMAL LOW (ref 83.0–108.0)

## 2020-04-20 LAB — TYPE AND SCREEN
ABO/RH(D): B POS
Antibody Screen: NEGATIVE
Unit division: 0
Unit division: 0
Unit division: 0
Unit division: 0
Unit division: 0

## 2020-04-20 LAB — BASIC METABOLIC PANEL
Anion gap: 10 (ref 5–15)
Anion gap: 10 (ref 5–15)
BUN: 70 mg/dL — ABNORMAL HIGH (ref 6–20)
BUN: 70 mg/dL — ABNORMAL HIGH (ref 6–20)
CO2: 27 mmol/L (ref 22–32)
CO2: 28 mmol/L (ref 22–32)
Calcium: 8.8 mg/dL — ABNORMAL LOW (ref 8.9–10.3)
Calcium: 8.9 mg/dL (ref 8.9–10.3)
Chloride: 110 mmol/L (ref 98–111)
Chloride: 109 mmol/L (ref 98–111)
Creatinine, Ser: 0.79 mg/dL (ref 0.61–1.24)
Creatinine, Ser: 0.65 mg/dL (ref 0.61–1.24)
GFR, Estimated: >60 mL/min (ref >60)
GFR, Estimated: >60 mL/min (ref >60)
Glucose, Bld: 277 mg/dL — ABNORMAL HIGH (ref 70–99)
Glucose, Bld: 214 mg/dL — ABNORMAL HIGH (ref 70–99)
Potassium: 3.6 mmol/L (ref 3.5–5.1)
Potassium: 4.1 mmol/L (ref 3.5–5.1)
Sodium: 147 mmol/L — ABNORMAL HIGH (ref 135–145)
Sodium: 147 mmol/L — ABNORMAL HIGH (ref 135–145)

## 2020-04-20 LAB — GLUCOSE, CAPILLARY
Glucose-Capillary: 145 mg/dL — ABNORMAL HIGH (ref 70–99)
Glucose-Capillary: 163 mg/dL — ABNORMAL HIGH (ref 70–99)
Glucose-Capillary: 190 mg/dL — ABNORMAL HIGH (ref 70–99)
Glucose-Capillary: 224 mg/dL — ABNORMAL HIGH (ref 70–99)
Glucose-Capillary: 227 mg/dL — ABNORMAL HIGH (ref 70–99)
Glucose-Capillary: 239 mg/dL — ABNORMAL HIGH (ref 70–99)
Glucose-Capillary: 245 mg/dL — ABNORMAL HIGH (ref 70–99)

## 2020-04-20 LAB — BPAM RBC
Blood Product Expiration Date: 202202192359
Blood Product Expiration Date: 202202202359
Blood Product Expiration Date: 202202212359
Blood Product Expiration Date: 202202212359
Blood Product Expiration Date: 202203022359
ISSUE DATE / TIME: 202202061742
Unit Type and Rh: 7300
Unit Type and Rh: 7300
Unit Type and Rh: 7300
Unit Type and Rh: 7300
Unit Type and Rh: 7300

## 2020-04-20 LAB — LACTIC ACID, PLASMA
Lactic Acid, Venous: 1.1 mmol/L (ref 0.5–1.9)
Lactic Acid, Venous: 1.2 mmol/L (ref 0.5–1.9)

## 2020-04-20 LAB — CBC
HCT: 28.1 % — ABNORMAL LOW (ref 39.0–52.0)
HCT: 30 % — ABNORMAL LOW (ref 39.0–52.0)
Hemoglobin: 8.3 g/dL — ABNORMAL LOW (ref 13.0–17.0)
Hemoglobin: 8.7 g/dL — ABNORMAL LOW (ref 13.0–17.0)
MCH: 28.6 pg (ref 26.0–34.0)
MCH: 28.2 pg (ref 26.0–34.0)
MCHC: 29.5 g/dL — ABNORMAL LOW (ref 30.0–36.0)
MCHC: 29 g/dL — ABNORMAL LOW (ref 30.0–36.0)
MCV: 96.9 fL (ref 80.0–100.0)
MCV: 97.1 fL (ref 80.0–100.0)
Platelets: 197 10*3/uL (ref 150–400)
Platelets: 206 10*3/uL (ref 150–400)
RBC: 2.9 MIL/uL — ABNORMAL LOW (ref 4.22–5.81)
RBC: 3.09 MIL/uL — ABNORMAL LOW (ref 4.22–5.81)
RDW: 20.4 % — ABNORMAL HIGH (ref 11.5–15.5)
RDW: 20.2 % — ABNORMAL HIGH (ref 11.5–15.5)
WBC: 12 10*3/uL — ABNORMAL HIGH (ref 4.0–10.5)
WBC: 13.5 10*3/uL — ABNORMAL HIGH (ref 4.0–10.5)
nRBC: 0.4 % — ABNORMAL HIGH (ref 0.0–0.2)
nRBC: 1.3 % — ABNORMAL HIGH (ref 0.0–0.2)

## 2020-04-20 LAB — PROTIME-INR
INR: 1.6 — ABNORMAL HIGH (ref 0.8–1.2)
Prothrombin Time: 18.1 seconds — ABNORMAL HIGH (ref 11.4–15.2)

## 2020-04-20 LAB — APTT
aPTT: 66 seconds — ABNORMAL HIGH (ref 24–36)
aPTT: 64 seconds — ABNORMAL HIGH (ref 24–36)

## 2020-04-20 LAB — LACTATE DEHYDROGENASE: LDH: 432 U/L — ABNORMAL HIGH (ref 98–192)

## 2020-04-20 LAB — FIBRINOGEN: Fibrinogen: 365 mg/dL (ref 210–475)

## 2020-04-20 LAB — VANCOMYCIN, TROUGH: Vancomycin Tr: 20 ug/mL (ref 15–20)

## 2020-04-20 MED ORDER — VANCOMYCIN HCL 750 MG/150ML IV SOLN
750.0000 mg | Freq: Two times a day (BID) | INTRAVENOUS | Status: DC
  Administered 2020-04-20 – 2020-04-24 (×8): 750 mg via INTRAVENOUS
  Filled 2020-04-20 (×10): qty 150

## 2020-04-20 MED ORDER — METOCLOPRAMIDE HCL 5 MG/ML IJ SOLN
5.0000 mg | Freq: Three times a day (TID) | INTRAMUSCULAR | Status: DC
  Administered 2020-04-20 – 2020-04-22 (×7): 5 mg via INTRAVENOUS
  Filled 2020-04-20 (×7): qty 2

## 2020-04-20 MED ORDER — INSULIN DETEMIR 100 UNIT/ML ~~LOC~~ SOLN
45.0000 [IU] | Freq: Two times a day (BID) | SUBCUTANEOUS | Status: DC
  Administered 2020-04-20 – 2020-04-24 (×10): 45 [IU] via SUBCUTANEOUS
  Filled 2020-04-20 (×13): qty 0.45

## 2020-04-20 MED ORDER — POTASSIUM CHLORIDE 20 MEQ PO PACK
40.0000 meq | PACK | Freq: Once | ORAL | Status: AC
  Administered 2020-04-20: 40 meq
  Filled 2020-04-20: qty 2

## 2020-04-20 MED ORDER — FUROSEMIDE 10 MG/ML IJ SOLN
40.0000 mg | Freq: Two times a day (BID) | INTRAMUSCULAR | Status: AC
  Administered 2020-04-20 (×2): 40 mg via INTRAVENOUS
  Filled 2020-04-20 (×2): qty 4

## 2020-04-20 MED ORDER — INSULIN ASPART 100 UNIT/ML ~~LOC~~ SOLN
9.0000 [IU] | SUBCUTANEOUS | Status: DC
  Administered 2020-04-20 – 2020-04-21 (×5): 9 [IU] via SUBCUTANEOUS

## 2020-04-20 MED ORDER — PROMETHAZINE-CODEINE 6.25-10 MG/5ML PO SYRP
5.0000 mL | ORAL_SOLUTION | Freq: Four times a day (QID) | ORAL | Status: DC | PRN
  Administered 2020-04-20 – 2020-05-03 (×12): 5 mL
  Filled 2020-04-20 (×12): qty 5

## 2020-04-20 MED ORDER — PROMETHAZINE-CODEINE 6.25-10 MG/5ML PO SYRP: 5.0000 mL | ORAL_SOLUTION | Freq: Four times a day (QID) | ORAL | Status: DC | PRN

## 2020-04-20 MED ORDER — GUAIFENESIN 100 MG/5ML PO SOLN
5.0000 mL | ORAL | Status: DC | PRN
  Administered 2020-04-21 – 2020-04-23 (×2): 100 mg
  Filled 2020-04-20 (×2): qty 5

## 2020-04-20 NOTE — Progress Notes (Signed)
ANTICOAGULATION CONSULT NOTE  Pharmacy Consult for bivalirudin Indication: ECMO and VTE  Labs: Recent Labs    04/18/20 0416 04/18/20 0422 04/19/20 0411 04/19/20 0631 04/19/20 1637 04/19/20 1640 04/19/20 2000 04/20/20 0357 04/20/20 0404  HGB 8.7*   < > 8.8*   < > 9.2*   < > 9.5* 8.3* 8.8*  HCT 28.4*   < > 29.0*   < > 31.0*   < > 28.0* 28.1* 26.0*  PLT 206   < > 193  --  226  --   --  197  --   APTT 76*   < > 61*  --  68*  --   --  66*  --   LABPROT 19.3*  --  17.9*  --   --   --   --  18.1*  --   INR 1.7*  --  1.5*  --   --   --   --  1.6*  --   CREATININE 0.95   < > 0.79  --  0.80  --   --  0.79  --    < > = values in this interval not displayed.    Assessment: 59 yoM admitted with COVID-19 PNA with worsening hypoxia, s/p cannulation for ECMO. Pt was started on IV heparin prior to cannulation due to acute DVTs and possible PE, transitioned to bivalirudin with ECMO. Now s/p tPA on 1/5 and tracheostomy on 1/6. Circuit last changed 1/26.  APTT therapeutic at 66 seconds, CBC stable, LDH and fibrinogen stable.  Goal of Therapy:  aPTT 60-80 seconds   Plan:  -Continue bivalirudin at 0.08 mg/kg/hr (using order-specific wt 72.1kg) -Monitor q12h aPTT/CBC, LDH, and for s/sx of bleeding  Fredonia Highland, PharmD, BCPS, [facility] Clinical Pharmacist [phone] Please check AMION for all [facility] Pharmacy numbers 04/20/2020

## 2020-04-20 NOTE — Progress Notes (Signed)
Patient ID: Alex [name], male   DOB: 1971/01/14, 50 y.o.   MRN: [id number]     Advanced Heart Failure Rounding Note  PCP-Cardiologist: No primary care provider on file.   Subjective:    - 12/30: VV ECMO cannulation - 12/31: Left chest tube replaced - 1/2: Extubated. Echo with EF 60-65%, mildly dilated RV with mildly decreased systolic function.  - 1/4: Agitated, suspected aspiration.  Re-intubated.  - 1/5: ECMO cannula repositioned under TEE guidance. TEE showed moderately dilated/moderate-severely dysfunctional RV in setting of hypoxemia. LUE DVT found.  Patient got 1/2 dose of TPA due to initial concern for large PE.  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  - 1/6: Tracheostomy - 1/7: Echo with mild RV dilation/mild RV dysfunction.  - 1/16: Left chest tube out - 1/17: LUE arterial dopplers repeated, showed no obstruction.  - 1/20: Echo with EF 65-70%, mildly D-shaped septum, mildly dilated and mildly dysfunctional RV.  - 1/22: CTA chest: Bilateral upper lobe PEs (suspect chronic), changes c/w ARDS - 1/23: Bronchoscopy showed semi-occlusive ?mass/polyp in the trachea.  - 1/24: Bronchoscopy showed resolution of 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 - 1/26: ECMO circuit changed.  - 2/1: CT chest showed diffuse bronchiectasis as well as diffuse opacity consistent with COVID-19 PNA with ARDS. - 2/3: Trach exchange - 2/5: Extubate to HFNC - 2/6: Enterococcal bacteremia.  Echo showed EF 65-70%, normal-appearing RV, no vegetation noted.  - 2/7: TEE with no vegetation, EF 55-60%, RV low normal function with normal size.   Sweep 8 today, remains on HFNC 10L.  Awake on vent.  CXR with severe bilateral airspace disease.   WBCs 22 => 16 => 12, PCT 14.5 => 8.43, now afebrile. He is on vancomycin for Enterococcus faecalis in blood cultures.   I/Os negative with 2 dose 40 mg IV Lasix + Diamox.    ECMO parameters: 3600 rpm Flow 4.9 L/min Pvenous -80 Delta P 33 Sweep 8 HFNC 10 L  ABG  7.37/53/75/94% LDH  534 => 488 => 547 => 461 => 474 => 489 => 553 => 449 => 457 => 421 => 464 => 469 => 432 PTT 66 Lactate 1.1  Objective:   Weight Range: 70.9 kg Body mass index is 26.83 kg/m.   Vital Signs:   Temp:  [98 F (36.7 C)-99.1 F (37.3 C)] 98.4 F (36.9 C) (02/08 0400) Pulse Rate:  [92-112] 112 (02/08 0600) Resp:  [10-41] 26 (02/08 0600) BP: (97-183)/(51-88) 113/75 (02/08 0600) SpO2:  [97 %-100 %] 99 % (02/08 0626) Arterial Line BP: (97-187)/(49-96) 143/63 (02/08 0600) FiO2 (%):  [40 %-98 %] 98 % (02/07 2032) Weight:  [70.9 kg] 70.9 kg (02/08 0426) Last BM Date: 04/18/20  Weight change: Filed Weights   04/18/20 0600 04/19/20 0600 04/20/20 0426  Weight: 69.2 kg 71.2 kg 70.9 kg    Intake/Output:   Intake/Output Summary (Last 24 hours) at 04/20/2020 0746 Last data filed at 04/20/2020 0600 Gross per 24 hour  Intake 2867.1 ml  Output 3700 ml  Net -832.9 ml      Physical Exam    General: Awake Neck: Thick. No JVD, no thyromegaly or thyroid nodule.  Lungs: Crackles bilaterally.  CV: Nondisplaced PMI.  Heart regular S1/S2, no S3/S4, no murmur.  No peripheral edema.   Abdomen: Soft, nontender, no hepatosplenomegaly, no distention.  Skin: Intact without lesions or rashes.  Neurologic: Awake, follows commands Extremities: Dry gangrene left hand fingers  HEENT: Normal.     Telemetry   NSR 100s  Personally reviewed   Labs    CBC Recent Labs    04/19/20 1637 04/19/20 1640 04/20/20 0357 04/20/20 0404  WBC 14.8*  --  12.0*  --   HGB 9.2*   < > 8.3* 8.8*  HCT 31.0*   < > 28.1* 26.0*  MCV 96.0  --  96.9  --   PLT 226  --  197  --    < > = values in this interval not displayed.   Basic Metabolic Panel Recent Labs    36/62/94 1637 04/19/20 1640 04/20/20 0357 04/20/20 0404  NA 144   < > 147* 148*  K 4.0   < > 3.6 3.6  CL 107  --  110  --   CO2 27  --  27  --   GLUCOSE 303*  --  277*  --   BUN 69*  --  70*  --   CREATININE 0.80  --  0.79  --    CALCIUM 9.0  --  8.8*  --    < > = values in this interval not displayed.   Liver Function Tests No results for input(s): AST, ALT, ALKPHOS, BILITOT, PROT, ALBUMIN in the last 72 hours. No results for input(s): LIPASE, AMYLASE in the last 72 hours. Cardiac Enzymes No results for input(s): CKTOTAL, CKMB, CKMBINDEX, TROPONINI in the last 72 hours.  BNP: BNP (last 3 results) No results for input(s): BNP in the last 8760 hours.  ProBNP (last 3 results) No results for input(s): PROBNP in the last 8760 hours.   D-Dimer No results for input(s): DDIMER in the last 72 hours. Hemoglobin A1C No results for input(s): HGBA1C in the last 72 hours. Fasting Lipid Panel No results for input(s): CHOL, HDL, LDLCALC, TRIG, CHOLHDL, LDLDIRECT in the last 72 hours. Thyroid Function Tests No results for input(s): TSH, T4TOTAL, T3FREE, THYROIDAB in the last 72 hours.  Invalid input(s): FREET3  Other results:   Imaging    ECHO TEE  Result Date: 04/19/2020    TRANSESOPHOGEAL ECHO REPORT   Patient Name:   Alex [name] Date of Exam: 04/19/2020 Medical Rec #:  [id number]      Height:       64.0 in Accession #:    [id number]     Weight:       157.0 lb Date of Birth:  March 03, 1971      BSA:          1.765 m Patient Age:    49 years       BP:           132/84 mmHg Patient Gender: M              HR:           88 bpm. Exam Location:  Inpatient Procedure: Transesophageal Echo Indications:    Endocarditis  History:        Patient has prior history of Echocardiogram examinations, most                 recent 04/18/2020.  Sonographer:    Alex [name] Referring Phys: Alex [name] Alex [name] PROCEDURE: After discussion of the risks and benefits of a TEE, an informed consent was obtained. The transesophogeal probe was passed without difficulty through the esophogus of the patient. Sedation performed by different physician. Patients was under conscious sedation during this procedure. Anesthetic administered: of  Fentanyl. The patient's vital signs; including heart rate, blood pressure, and oxygen saturation; remained stable  throughout the procedure. The patient developed no complications during the procedure. IMPRESSIONS  1. Left ventricular ejection fraction, by estimation, is 55 to 60%. The left ventricle has normal function. The left ventricle has no regional wall motion abnormalities.  2. Right ventricular systolic function is low normal. The right ventricular size is normal.  3. No left atrial/left atrial appendage thrombus was detected.  4. ECMO cannula noted in the right atrium with flow directed towards the tricuspid valve. There is a PICC line noted ending in the the RA just beyond the SVC inlet, it is adjacent to the ECMO cannula. No vegetation noted on PICC or ECMO cannula.  5. No aortic valve vegetation. The aortic valve is tricuspid. Aortic valve regurgitation is not visualized. No aortic stenosis is present.  6. No pulmonic valve vegetation.  7. No mitral valve vegetation. The mitral valve is normal in structure. Trivial mitral valve regurgitation. No evidence of mitral stenosis.  8. No tricuspid valve vegetation.  9. Small PFO noted by color doppler. FINDINGS  Left Ventricle: Left ventricular ejection fraction, by estimation, is 55 to 60%. The left ventricle has normal function. The left ventricle has no regional wall motion abnormalities. The left ventricular internal cavity size was normal in size. There is  no left ventricular hypertrophy. Right Ventricle: The right ventricular size is normal. No increase in right ventricular wall thickness. Right ventricular systolic function is low normal. Left Atrium: Left atrial size was normal in size. No left atrial/left atrial appendage thrombus was detected. Right Atrium: Right atrial size was normal in size. Pericardium: Trivial pericardial effusion is present. Mitral Valve: No mitral valve vegetation. The mitral valve is normal in structure. Trivial mitral valve  regurgitation. No evidence of mitral valve stenosis. Tricuspid Valve: No tricuspid valve vegetation. The tricuspid valve is normal in structure. Tricuspid valve regurgitation is trivial. Aortic Valve: No aortic valve vegetation. The aortic valve is tricuspid. Aortic valve regurgitation is not visualized. No aortic stenosis is present. Pulmonic Valve: No pulmonic valve vegetation. The pulmonic valve was normal in structure. Pulmonic valve regurgitation is not visualized. Aorta: The aortic root is normal in size and structure. IAS/Shunts: Small PFO noted by color doppler. Marca Anconaalton Taheerah Guldin MD Electronically signed by Marca Anconaalton Rondrick Barreira MD Signature Date/Time: 04/19/2020/2:42:09 PM    Final    US EKG SITE RITE  Result Date: 04/19/2020 If Site Rite image not attached, placement could not be confirmed due to current cardiac rhythm.    Medications:     Scheduled Medications: . sodium chloride   Intravenous Once  . acetaZOLAMIDE  250 mg Per Tube BID  . carvedilol  25 mg Oral BID WC  . chlorhexidine  15 mL Mouth Rinse BID  . Chlorhexidine Gluconate Cloth  6 each Topical Daily  . chlorpheniramine-HYDROcodone  5 mL Per Tube Q12H  . clonazePAM  1 mg Oral QID  . cloNIDine  0.1 mg Per Tube Q8H  . dextromethorphan  30 mg Per Tube TID  . docusate  100 mg Per Tube BID  . fiber  1 packet Per Tube BID  . free water  200 mL Per Tube Q4H  . furosemide  40 mg Intravenous BID  . gabapentin  900 mg Per Tube Q8H  . insulin aspart  0-20 Units Subcutaneous Q4H  . insulin aspart  8 Units Subcutaneous Q4H  . insulin detemir  45 Units Subcutaneous BID  . ipratropium-albuterol  3 mL Nebulization QID  . lactobacillus acidophilus  2 tablet Per Tube TID  .  liver oil-zinc oxide   Topical 5 X Daily  . mouth rinse  15 mL Mouth Rinse q12n4p  . melatonin  3 mg Per Tube QHS  . metoCLOPramide (REGLAN) injection  10 mg Intravenous Q8H  . nutrition supplement (JUVEN)  1 packet Per Tube BID BM  . oxyCODONE  15 mg Per Tube Q6H  .  pantoprazole sodium  40 mg Per Tube QHS  . QUEtiapine  100-300 mg Per Tube QHS  . sennosides  10 mL Per Tube QHS  . sodium chloride flush  10-40 mL Intracatheter Q12H  . sodium chloride flush  10-40 mL Intracatheter Q12H    Infusions: . sodium chloride    . sodium chloride Stopped (04/16/20 0934)  . sodium chloride 250 mL (04/07/20 2203)  . sodium chloride Stopped (03/12/20 0131)  . sodium chloride Stopped (04/19/20 1959)  . albumin human 12.5 g (04/11/20 1558)  . bivalirudin (ANGIOMAX) infusion 0.5 mg/mL (Non-ACS indications) 0.08 mg/kg/hr (04/20/20 0600)  . feeding supplement (PIVOT 1.5 CAL) 1,000 mL (04/19/20 2152)  . norepinephrine (LEVOPHED) Adult infusion Stopped (04/19/20 0146)  . vancomycin Stopped (04/19/20 2111)    PRN Medications: Place/Maintain arterial line **AND** sodium chloride, sodium chloride, sodium chloride, acetaminophen (TYLENOL) oral liquid 160 mg/5 mL, albumin human, [DISCONTINUED] lidocaine **AND** albuterol, Gerhardt's butt cream, guaiFENesin, hydrALAZINE, labetalol, lip balm, midazolam, morphine, morphine CONCENTRATE, ondansetron (ZOFRAN) IV, phenol, polyethylene glycol, simethicone, sodium chloride flush, sodium chloride flush   Assessment/Plan   1. Acute hypoxemic respiratory failure: Due to COVID-19 PNA with bilateral infiltrates.  Refractory hypoxemia, VV-ECMO cannulation on 03/10/2020 with improvement in oxygenation.  Developed left PTX post-subclavian CVL and had left chest tube, the left lung is re-expanded and CT out.  He was extubated 1/2 but reintubated 1/4 with agitation and suspected aspiration.  Tracheostomy 1/6.  CTA chest 1/22 with suspected chronic PEs and ARDS. ECMO cannula repositioned 1/5. ECMO circuit changed 1/26. Extubated to HFNC on 2/5. LDH stable. Sweep at 8, have struggled with hypercarbia from significant dead space ventilation and had a set back with recurrent sepsis.  Restarted abx 2/5 with sepsis, Enterococcus faecalis, now on  vancomycin.  I/Os negative with IV Lasix. CXR is a bit worse with extubation and loss of PEEP.  - Continue to try to wean sweep, will allow lower pH.  - Lasix 40 mg IV bid again today with acetazolamide 250 bid.  - On vancomycin with Enterococcus.    - Patient has had remdesivir, tocilizumab. - Completed steroid taper. - Continue bivalirudin, goal PTT 65-80.  Discussed dosing with PharmD personally.  - Continue to mobilize  - Coreg increased to 25 mg bid to increase fractional flow via ECMO circuit.  - Will need to consider for lung transplantation if no improvement, think we need to start thinking along this line once Enterococcal infection treated.  2. 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  Clot noted on TEE in RV as well.  TTE 1/2 showed normal EF 60-65%, RV improved (mildly dilated/dysfunctional). TEE on 1/5 with moderate to severe RV dysfunction but patient was hypoxemic.  Had 1/2 dose TPA on 1/5. Echo 1/20 with mildly dilated/mildly dysfunctional RV. CTA chest 1/22 with chronic-appearing PEs in upper lobes. - Bivalirudin for goal PTT 65-80.  Discussed dosing with PharmD personally. 3. Left PTX: Left chest tube, lung is re-expanded. Tube now out, stable CXR. 4. Shock: Suspect septic/distributive.  Now resolved, off NE.  5. Anemia: Hgb 8.3, transfuse < 8.  6. AKI: Stable renal indices, follow closely.   7. Hyperglycemia: insulin.  8. HTN: BP stable.     - On clonidine.  - Continue Coreg 25 mg bid.   - Suspect arterial line inaccurate, following cuff for now.  9. CHB: Episode of CHB when hypoxemic and with cough (suspect vagal).  NSR since then.    - Continue Coreg, watch rhythm.  10. Thrombocytopenia: Resolved  11. Ileus: Resolved. TFs restarted.  - Getting Reglan  - Cor-trak repositioned to post-pyloric placement 12. Ischemic digits: LUE.  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  Repeat study 1/17  showed no obstruction.  - Wound Care following. 13. ID: Group F Strep and Enterococcus faecalis in sputum. Initially completed abx for these bacteria. Now with recurrent sepsis, Enterococcus faecalis in blood now. Vancomycin restarted. TEE 2/7 with no vegetation.  - Continue vancomycin.   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   15. Hypernatremia: Continue free water.   CRITICAL CARE Performed by: Marca Ancona  Total critical care time: 40 minutes  Critical care time was exclusive of separately billable procedures and treating other patients.  Critical care was necessary to treat or prevent imminent or life-threatening deterioration.  Critical care was time spent personally by me on the following activities: development of treatment plan with patient and/or surrogate as well as nursing, discussions with consultants, evaluation of patient's response to treatment, examination of patient, obtaining history from patient or surrogate, ordering and performing treatments and interventions, ordering and review of laboratory studies, ordering and review of radiographic studies, pulse oximetry and re-evaluation of patient's condition.    Length of Stay: 49  Marca Ancona, MD  04/20/2020, 7:46 AM  Advanced Heart Failure Team Pager [phone] (M-F; 7a - 4p)  Please contact [facility] for night-coverage after hours (4p -7a ) and weekends on [email or web address]

## 2020-04-20 NOTE — Progress Notes (Signed)
Pharmacy Antibiotic Note  Alex [name] is a 50 y.o. male on ECMO. Pharmacy consulted to dose vancomycin for treatment of enterococcal bacteremia. TEE showed no vegetation. Will continue on vancomycin. Vancomycin trough this morning drawn correctly at upper end of goal at 20. Leukocytosis improving.   Plan: - Reduce to vancomycin 750mg  Q12H  - Will follow renal function, cultures and clinical progress - Follow levels as needed    Height: 5\' 4"  (162.6 cm) Weight: 70.9 kg (156 lb 4.9 oz) IBW/kg (Calculated) : 59.2  Temp (24hrs), Avg:98.5 F (36.9 C), Min:98 F (36.7 C), Max:99.1 F (37.3 C)  Recent Labs  Lab 04/18/20 0416 04/18/20 1607 04/18/20 1657 04/19/20 0411 04/19/20 1637 04/20/20 0357 04/20/20 0737  WBC 21.8* 17.1* 16.1* 16.3* 14.8* 12.0*  --   CREATININE 0.95 0.94  --  0.79 0.80 0.79  --   LATICACIDVEN 1.2 1.6  --  1.1 1.2 1.1  --   VANCOTROUGH  --   --   --   --   --   --  20    Estimated Creatinine Clearance: 95.6 mL/min (by C-G formula based on SCr of 0.79 mg/dL).    No Known Allergies   Thank you for allowing pharmacy to be a part of this patient's care.  06/18/20, PharmD, MBA Pharmacy Resident [phone] 04/20/2020 11:43 AM

## 2020-04-20 NOTE — Progress Notes (Signed)
ANTICOAGULATION CONSULT NOTE  Pharmacy Consult for bivalirudin Indication: ECMO and VTE  Labs: Recent Labs    04/18/20 0416 04/18/20 0422 04/19/20 0411 04/19/20 0631 04/19/20 1637 04/19/20 1640 04/20/20 0357 04/20/20 0404 04/20/20 1153 04/20/20 1530 04/20/20 1538  HGB 8.7*   < > 8.8*   < > 9.2*   < > 8.3*   < > 9.2* 8.7* 9.2*  HCT 28.4*   < > 29.0*   < > 31.0*   < > 28.1*   < > 27.0* 30.0* 27.0*  PLT 206   < > 193  --  226  --  197  --   --  206  --   APTT 76*   < > 61*  --  68*  --  66*  --   --  64*  --   LABPROT 19.3*  --  17.9*  --   --   --  18.1*  --   --   --   --   INR 1.7*  --  1.5*  --   --   --  1.6*  --   --   --   --   CREATININE 0.95   < > 0.79  --  0.80  --  0.79  --   --  0.65  --    < > = values in this interval not displayed.    Assessment: 69 yoM admitted with COVID-19 PNA with worsening hypoxia, s/p cannulation for ECMO. Pt was started on IV heparin prior to cannulation due to acute DVTs and possible PE, transitioned to bivalirudin with ECMO. Now s/p tPA on 1/5 and tracheostomy on 1/6. Circuit last changed 1/26.  APTT is therapeutic at 64 seconds, CBC stable, LDH and fibrinogen stable. Circuit remains unchanged.   Goal of Therapy:  aPTT 60-80 seconds   Plan:  -Continue bivalirudin at 0.08 mg/kg/hr (using order-specific wt 72.1kg) -Monitor q12h aPTT/CBC, LDH, and for s/sx of bleeding  Sherron Monday, PharmD, BCCCP Clinical Pharmacist  Phone: [phone] 04/20/2020 5:36 PM  Please check AMION for all [facility] Pharmacy phone numbers After 10:00 PM, call Main Pharmacy [phone]

## 2020-04-20 NOTE — Progress Notes (Signed)
Physical Therapy Treatment Patient Details Name: Joseangel Nettleton MRN: [id number] DOB: [birth year] Today's Date: 04/20/2020    History of Present Illness Pt is 50 y.o. male  with no significant past medical history admitted on 02/13/2020 with dyspnea, cough, nausea/vomiting ~ 1 week ago with worsening symptoms of body aches and fatigue and +COVID 02/07/20 and admitted with shortness of breath. Required intubation 03/10/20. Cannulated for VV ECMO 03/07/2020. Oxygenating better with ECMO. Also evidence of RLE DVT, RV thrombus, and high suspicion of PE. Has required chest tube to L lung for collapse which needs further reposition with recurrent collapse. Extubated 03-15-19.  Reintubated 1/5 and trach placed 1/6. Decannulated 04/16/20.    PT Comments    Pt admitted with above diagnosis. Pt was able to sit EOB 20 minutes and perform UE and LEs. Pt on 10L HFNC with sats 86-94% with activity.  Other VSS.   Pt currently with functional limitations due to balance and endurance deficits. Pt will benefit from skilled PT to increase their independence and safety with mobility to allow discharge to the venue listed below.     Follow Up Recommendations  CIR;Supervision/Assistance - 24 hour     Equipment Recommendations  Other (comment) (TBD (pt still on ECMO))    Recommendations for Other Services       Precautions / Restrictions Precautions Precautions: Fall Precaution Comments: ECMO, NGtube, Foley, Pt decannulated himself 2/4 Required Braces or Orthoses: Splint/Cast Splint/Cast:  (B Prevalon boots) Restrictions Weight Bearing Restrictions: No    Mobility  Bed Mobility Overal bed mobility: Needs Assistance Bed Mobility: Supine to Sit;Sit to Supine Rolling: Max assist;+2 for physical assistance;+2 for safety/equipment Sidelying to sit: Total assist;+2 for physical assistance Supine to sit: Total assist;+2 for physical assistance   Sit to sidelying: Total assist;+2 for physical assistance (extra 2  persons for lines) General bed mobility comments: NT  Transfers Overall transfer level: Needs assistance               General transfer comment: NT  Ambulation/Gait             General Gait Details: TBA   Stairs             Wheelchair Mobility    Modified Rankin (Stroke Patients Only)       Balance Overall balance assessment: Needs assistance Sitting-balance support: No upper extremity supported;Feet supported;Bilateral upper extremity supported Sitting balance-Leahy Scale: Poor Sitting balance - Comments: Varied from min A-mod A with left lateral lean as time progressed ; Sat EOB 20 minutes; Pt performed neck extension, LE LAQ with ability to sustain left LE into full knee extension for up to 5 seconds at least once each LE.  attempts to move left UE and grip with left UE; Pt gripped PT with right UE and did push and pull PT away. Postural control: Left lateral lean Standing balance support: Bilateral upper extremity supported;During functional activity Standing balance-Leahy Scale: Poor Standing balance comment: unable                            Cognition Arousal/Alertness: Lethargic;Suspect due to medications Behavior During Therapy: Restless;Flat affect Overall Cognitive Status: Impaired/Different from baseline                                 General Comments: inconsistently following 1 step commands; also affected by meds      Exercises General  Exercises - Upper Extremity Shoulder Flexion: AAROM;Both;10 reps;Supine;Seated (to 90 FF on R due to cannula) Shoulder ABduction: PROM;Both;10 reps;Supine Elbow Flexion: Both;10 reps;AAROM;Seated Elbow Extension: Both;10 reps;Seated;Sidelying;AAROM;Strengthening Digit Composite Flexion: Both;10 reps;Seated;Supine;AAROM;AROM;PROM Composite Extension: Both;10 reps;Seated;Supine;PROM;AROM;AAROM;Strengthening General Exercises - Lower Extremity Ankle Circles/Pumps: PROM;Both;10  reps;Supine Long Arc Quad: Both;Seated;AAROM;5 reps Other Exercises Other Exercises: gentle retrodgrade massage R/L hand with significant decrease in edema Other Exercises: L hand splint further modified to attempt to increase MP flexion and IP extension    General Comments        Pertinent Vitals/Pain Pain Assessment: Faces Faces Pain Scale: Hurts little more Pain Location: nose Pain Descriptors / Indicators: Discomfort;Grimacing;Guarding Pain Intervention(s): Limited activity within patient's tolerance;Monitored during session;Premedicated before session;Repositioned    Home Living                      Prior Function            PT Goals (current goals can now be found in the care plan section) Acute Rehab PT Goals Patient Stated Goal: unable to state Progress towards PT goals: Progressing toward goals    Frequency    Min 3X/week      PT Plan Current plan remains appropriate    Co-evaluation              AM-PAC PT "6 Clicks" Mobility   Outcome Measure  Help needed turning from your back to your side while in a flat bed without using bedrails?: Total Help needed moving from lying on your back to sitting on the side of a flat bed without using bedrails?: Total Help needed moving to and from a bed to a chair (including a wheelchair)?: Total Help needed standing up from a chair using your arms (e.g., wheelchair or bedside chair)?: Total Help needed to walk in hospital room?: Total Help needed climbing 3-5 steps with a railing? : Total 6 Click Score: 6    End of Session   Activity Tolerance: Patient limited by fatigue Patient left: in bed;with nursing/sitter in room;with call bell/phone within reach (left bed in chair position) Nurse Communication: Mobility status PT Visit Diagnosis: Muscle weakness (generalized) (M62.81);Pain Pain - part of body:  (chest)     Time: 7471-5953 PT Time Calculation (min) (ACUTE ONLY): 40 min  Charges:   $Therapeutic Exercise: 8-22 mins $Therapeutic Activity: 23-37 mins                     Dimetrius Montfort W,PT Acute Rehabilitation Services Pager:  [phone]  Office:  [phone]     Berline Lopes 04/20/2020, 12:25 PM

## 2020-04-20 NOTE — Evaluation (Signed)
Clinical/Bedside Swallow Evaluation Patient Details  Name: Alex [name] MRN: [id number] Date of Birth: 1970-05-25  Today's Date: 04/20/2020 Time: SLP Start Time (ACUTE ONLY): 1144 SLP Stop Time (ACUTE ONLY): 1157 SLP Time Calculation (min) (ACUTE ONLY): 13 min  Past Medical History: History reviewed. No pertinent past medical history. Past Surgical History:  Past Surgical History:  Procedure Laterality Date  . CENTRAL LINE INSERTION  02/11/2020   Procedure: CENTRAL LINE INSERTION;  Surgeon: Laurey Morale, MD;  Location: [facility] INVASIVE CV LAB;  Service: Cardiovascular;;  . ECMO CANNULATION N/A 03/04/2020   Procedure: ECMO CANNULATION;  Surgeon: Laurey Morale, MD;  Location: [facility] INVASIVE CV LAB;  Service: Cardiovascular;  Laterality: N/A;  . TEE WITHOUT CARDIOVERSION  02/25/2020   Procedure: TRANSESOPHAGEAL ECHOCARDIOGRAM (TEE);  Surgeon: Laurey Morale, MD;  Location: [facility] INVASIVE CV LAB;  Service: Cardiovascular;;  . [place] teeth     HPI:  50 year old man admitted to hospital 12/28 with 1 week history of dyspnea cough nausea and vomiting.  Initially admitted to [facility] long hospital and placed on high flow nasal cannula but rapidly failed and required intubation 12/29.  Persistent hypoxic respiratory failure with PF ratio 55 in spite of 18 of PEEP FiO2 0.1 despite paralytics.  Did not improve with prone ventilation. Cannulated for VV ECMO 12/30 via right IJ crescent cannula. Iatrogenic pneumothorax from left subclavian triple-lumen placement. Trach on 1/6. Decannulated 04/16/20   Assessment / Plan / Recommendation Clinical Impression  Pt was seen for swallowing evaluation post-decannulation last week. He is most impacted at this time by lethargy and significant, generalized weakness. Per ECMO specialist, pt has already had an eventful morning with PT and OT sessions, and she had also recently increased his sweep due to persistent tachypnea. He did open his mouth to command with limited  mandibular ROM. When asked to protrude his tongue he opens his mouth as well, but protrusion is very limited. Pt made no lingual or labial movements in response to ice chip though. Will continue to follow to facilitate utilization of his swallowing muculature with further instrumental evaluation of swallowing as his levels of alertness and endurance improve. Pt may benefit from coordination of therapy services either jointly to maximize arousal or at least attempting swallowing exercises before other activities.  SLP Visit Diagnosis: Dysphagia, unspecified (R13.10)    Aspiration Risk  Risk for inadequate nutrition/hydration;Severe aspiration risk    Diet Recommendation NPO   Medication Administration: Via alternative means    Other  Recommendations Oral Care Recommendations: Oral care QID Other Recommendations: Have oral suction available   Follow up Recommendations Inpatient Rehab      Frequency and Duration min 2x/week  2 weeks       Prognosis Prognosis for Safe Diet Advancement: Good      Swallow Study   General HPI: 50 year old man admitted to hospital 12/28 with 1 week history of dyspnea cough nausea and vomiting.  Initially admitted to [facility] long hospital and placed on high flow nasal cannula but rapidly failed and required intubation 12/29.  Persistent hypoxic respiratory failure with PF ratio 55 in spite of 18 of PEEP FiO2 0.1 despite paralytics.  Did not improve with prone ventilation. Cannulated for VV ECMO 12/30 via right IJ crescent cannula. Iatrogenic pneumothorax from left subclavian triple-lumen placement. Trach on 1/6. Decannulated 04/16/20 Type of Study: Bedside Swallow Evaluation Previous Swallow Assessment: none in chart Diet Prior to this Study: NPO;NG Tube Temperature Spikes Noted: No Respiratory Status: Nasal cannula (HFNC) History of Recent  Intubation: No Behavior/Cognition: Lethargic/Drowsy;Requires cueing Oral Cavity Assessment: Other (comment) (limited  visibility but no significant findings) Oral Care Completed by SLP: Yes Oral Cavity - Dentition: Adequate natural dentition Vision: Impaired for self-feeding Self-Feeding Abilities: Total assist Patient Positioning: Upright in bed Baseline Vocal Quality: Not observed    Oral/Motor/Sensory Function Overall Oral Motor/Sensory Function: Generalized oral weakness   Ice Chips Ice chips: Impaired Presentation: Spoon Oral Phase Impairments: Poor awareness of bolus;Reduced labial seal;Reduced lingual movement/coordination (no acceptance)   Thin Liquid Thin Liquid: Not tested    Nectar Thick Nectar Thick Liquid: Not tested   Honey Thick Honey Thick Liquid: Not tested   Puree Puree: Not tested   Solid     Solid: Not tested      Mahala Menghini., M.A. CCC-SLP Acute Rehabilitation Services Pager [phone] Office [phone]  04/20/2020,12:41 PM

## 2020-04-20 NOTE — Plan of Care (Signed)
  Problem: Health Behavior/Discharge Planning: Goal: Ability to manage health-related needs will improve Outcome: Progressing   Problem: Clinical Measurements: Goal: Ability to maintain clinical measurements within normal limits will improve Outcome: Progressing Goal: Will remain free from infection Outcome: Progressing Goal: Diagnostic test results will improve Outcome: Progressing Goal: Respiratory complications will improve Outcome: Progressing Goal: Cardiovascular complication will be avoided Outcome: Progressing   Problem: Activity: Goal: Risk for activity intolerance will decrease Outcome: Progressing   Problem: Coping: Goal: Level of anxiety will decrease Outcome: Progressing   Problem: Pain Managment: Goal: General experience of comfort will improve Outcome: Progressing   Problem: Elimination: Goal: Will not experience complications related to bowel motility Outcome: Progressing Goal: Will not experience complications related to urinary retention Outcome: Progressing   Problem: Safety: Goal: Ability to remain free from injury will improve Outcome: Progressing   Problem: Skin Integrity: Goal: Risk for impaired skin integrity will decrease Outcome: Progressing

## 2020-04-20 NOTE — Procedures (Signed)
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   Configuration: Venovenous  Drainage cannula: 32 French crescent cannula via right IJ Return cannula: Same  Pump speed: 3600 RPM Pump flow: 4.9 L/min Pump used: Cardio help  Oxygenator: Cardio help O2 blender: 100% Sweep gas: 8L  Circuit check: minimal clots in circuit Anticoagulant: Bivalirudin Anticoagulation targets: PTT 60-80  Changes in support:  none  Anticipated goals/duration of support: Bridge to recovery.  Multidisciplinary ECMO rounds completed.  Steffanie Dunn, DO 04/20/20 8:00 AM  Pulmonary & Critical Care

## 2020-04-20 NOTE — Progress Notes (Signed)
Occupational Therapy Treatment Patient Details Name: Alex [name] MRN: [id number] DOB: January 02, 1971 Today's Date: 04/20/2020    History of present illness Pt is 50 y.o. male  with no significant past medical history admitted on 02/29/2020 with dyspnea, cough, nausea/vomiting ~ 1 week ago with worsening symptoms of body aches and fatigue and +COVID 02/07/20 and admitted with shortness of breath. Required intubation 03/10/20. Cannulated for VV ECMO Apr 07, 2020. Oxygenating better with ECMO. Also evidence of RLE DVT, RV thrombus, and high suspicion of PE. Has required chest tube to L lung for collapse which needs further reposition with recurrent collapse. Extubated 03-15-19.  Reintubated 1/5 and trach placed 1/6. Decannulated 04/16/20.   OT comments  Focus of session on BUE ROM/strengthening. Pt lethargic and inconsistently following commands. Reaching for NG tube at times and apparently having discomfort @ his nose - nsg made aware. Tubular dressings on digits appear to be working well (extra box of materials provided by ortho tech yesterday). Completed sustained passive stretch., gentle retrograde massage and A/AA/PROM B hands/UE (limited to 90 FF on R). Decreased edema and increased ROM end of session. Pt able to achieve full gross composite flexion L hand after intervention. Encouraged BUE to be placed in elevated position although difficult to maintain due to pt movement and restlessness. Pt completed simple grooming tasks by assisting with washing face and suctioning self. When awake, encourage frequent B hand movement. B Prevalon boots correctly positioned. Goals updated. Continue to follow acutely.   Follow Up Recommendations  CIR;Supervision/Assistance - 24 hour    Equipment Recommendations  Other (comment) (will further assess)    Recommendations for Other Services      Precautions / Restrictions Precautions Precautions: Fall Precaution Comments: ECMO, NGtube, Foley, Pt decannulated himself  2/4 Required Braces or Orthoses: Splint/Cast Splint/Cast: Left resting hand splint to be worn for 4 hours at night (8pm-12am) with RN to check thumb at end of wearing time for any pressure issues and contact OT department if so (numbers left in orders section of chart) (B Prevalon boots) Restrictions Weight Bearing Restrictions: No       Mobility Bed Mobility                  Transfers                      Balance                                           ADL either performed or assessed with clinical judgement   ADL Overall ADL's : Needs assistance/impaired                                       General ADL Comments: total A; able to assist with wipng eyes, nose and mouth using R hand. Held [place] and supported arm while pt attempted to suction mouth     Vision       Perception     Praxis      Cognition Arousal/Alertness: Lethargic Behavior During Therapy: Restless;Flat affect Overall Cognitive Status: Impaired/Different from baseline                                 General Comments: inconsistently following 1  step commands; also affected by meds        Exercises General Exercises - Upper Extremity Shoulder Flexion: AAROM;Both;10 reps;Supine;Seated (to 90 FF on R due to cannula) Elbow Flexion: Both;10 reps;AAROM;Seated Elbow Extension: Both;10 reps;Seated;Sidelying;AAROM;Strengthening Digit Composite Flexion: Both;10 reps;Seated;Supine;AAROM;AROM;PROM Composite Extension: Both;10 reps;Seated;Supine;PROM;AROM;AAROM;Strengthening Other Exercises Other Exercises: gentle retrodgrade massage R/L hand with significant decrease in edema Other Exercises: L hand splint further modified to attempt to increase MP flexion and IP extension   Shoulder Instructions       General Comments      Pertinent Vitals/ Pain       Pain Assessment: Faces Faces Pain Scale: Hurts little more Pain Location: nose Pain  Descriptors / Indicators: Discomfort;Grimacing;Guarding Pain Intervention(s): Limited activity within patient's tolerance (nsg notified)  Home Living                                          Prior Functioning/Environment              Frequency  Min 2X/week        Progress Toward Goals  OT Goals(current goals can now be found in the care plan section)  Progress towards OT goals: Goals drowngraded-see care plan  Acute Rehab OT Goals Patient Stated Goal: unable to state OT Goal Formulation: Patient unable to participate in goal setting Potential to Achieve Goals: Fair ADL Goals Pt Will Perform Grooming: with mod assist;sitting Pt Will Perform Upper Body Bathing: with max assist;sitting Pt/caregiver will Perform Home Exercise Program: Increased ROM;Increased strength;Both right and left upper extremity;With minimal assist Additional ADL Goal #2: Pt will complete hand to mouth pattern x 10 in preparation for ADL tasks Additional ADL Goal #3: Pt will maintain unsupported  midline postural control with mod A for 5 min in preparation for ADL tasks  Plan Discharge plan remains appropriate    Co-evaluation                 AM-PAC OT "6 Clicks" Daily Activity     Outcome Measure   Help from another person eating meals?: Total Help from another person taking care of personal grooming?: A Lot Help from another person toileting, which includes using toliet, bedpan, or urinal?: Total Help from another person bathing (including washing, rinsing, drying)?: Total Help from another person to put on and taking off regular upper body clothing?: Total Help from another person to put on and taking off regular lower body clothing?: Total 6 Click Score: 7    End of Session    OT Visit Diagnosis: Muscle weakness (generalized) (M62.81);Other symptoms and signs involving cognitive function;Other abnormalities of gait and mobility (R26.89)   Activity Tolerance  Patient limited by lethargy;Patient limited by fatigue;Other (comment) (sedating meds)   Patient Left in bed;with call bell/phone within reach;with nursing/sitter in room   Nurse Communication Other (comment) (keep BUE elevated to reduce dependent edema; encourage frequent movement of hands when awake; splint to be worn on L hand only at night)        Time: 4782-9562 OT Time Calculation (min): 25 min  Charges: OT General Charges $OT Visit: 1 Visit OT Treatments $Self Care/Home Management : 8-22 mins $Therapeutic Exercise: 8-22 mins  Luisa Dago, OT/L   Acute OT Clinical Specialist Acute Rehabilitation Services Pager [phone] Office [phone]    [facility] 04/20/2020, 10:00 AM

## 2020-04-20 NOTE — Progress Notes (Signed)
Nutrition Follow-up  DOCUMENTATION CODES:   Not applicable  INTERVENTION:   Continue Tube Feeding via Cortrak:  Pivot 1.5 to 70 ml/hr Provides 2520 kcals, 158 g of protein and 1275 mL of free water  ContinueJuven BIDto promote wound healing, each packet provides 80 calories, 8 grams of carbohydrate, 2.5 grams of protein (collagen), 7 grams of L-arginine and 7 grams of L-glutamine; supplement contains CaHMB, Vitamins C, E, B12 and Zinc   Recommend increasing free water flushes to account for free water deficit given persistent hypernatremia   NUTRITION DIAGNOSIS:   Increased nutrient needs related to acute illness,catabolic illness (COVID-19 infection) as evidenced by estimated needs.  Being addressed via nutrition support  GOAL:   Patient will meet greater than or equal to 90% of their needs  Progressing  MONITOR:   Vent status,TF tolerance,Labs,Weight trends  REASON FOR ASSESSMENT:   LOS Enteral/tube feeding initiation and management  ASSESSMENT:   50 y.o. male with no significant medical history. He presented to the ED with dyspnea, cough, and N/V. He reported that his symptoms began with cough 1 week ago. He tried OTC meds but nothing helped. Symptoms worsened to include body aches, fatigue, and N/V 3 days later. He went to his PCP 12/26 and got tested for COVID; he was positive.  12/26 COVID+  12/28 Admitted to [facility] 12/29 Intubated 12/30 Transferred to [facility], VV ECMO cannulation, L PTX with Chest tube insertion 12/31 Cortrak placed, Post-pyloric  1/02 Extubated to HFNC/BiPap as needed, ECHO with EF 60-65% 1/06 TEE for ECMO cannula position, Re-Intubated, Rhinorockets placed for epistaxis, Cortrak malpositioned-repositioned and now gastric per xray 1/07 Trach placed 1/14 Cortrak advanced to post pyloric position 1/16 L. Chest tube removed 1/22 CT chest: bilateral UL PE 1/23 Bronch showed semi occlusive ?mass/polyp in trachea 1/24 Bronch showed resolution of  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 1/26 ECMO Circuit exchanged 2/03 Trach exchange 2/05 Extubated to HFNC 2/07 TEE with no vegetation, EF 55-60%  Currently on 10 L HFNC; remains on VV ECMO, Sweep 8 L  SLP following and evaluated today for diet, remains NPO post eval Continues to work with PT/OT  Tolerating Pivot 1.5 at 70 ml/hr via Cortrak (tip in jejunum)  No new areas of skin breakdown per RN skin assessment  +loose stool, rectal tube now reinserted  Labs: sodium 148 (H), Creatinine wdl, CBGs 227-239 (ICU goal 140-180) Meds: lasix, nutrisource fiber, ss novolog, novolog q 4 hours, levemir, lactobacillus acidophilus, simethicone, senokot, miralax   Diet Order:   Diet Order            Diet NPO time specified  Diet effective now                 EDUCATION NEEDS:   Not appropriate for education at this time  Skin:  Skin Assessment: Skin Integrity Issues: Skin Integrity Issues:: Stage II,Other (Comment) DTI: buttocks, forhead Stage II: anus, coccyx Other: ischemic changes to LUE, large bulla on L hand that ruptured  Last BM:  2/8 rectal tube re-inserted  Height:   Ht Readings from Last 1 Encounters:  03/03/2020 5\' 4"  (1.626 m)    Weight:   Wt Readings from Last 1 Encounters:  04/20/20 70.9 kg    Ideal Body Weight:  59.1 kg  BMI:  Body mass index is 26.83 kg/m.  Estimated Nutritional Needs:   Kcal:  2160-2520 kcals  Protein:  130-150 g  Fluid:  >/= 2 L   05-30-1992 MS, RDN, LDN, CNSC Registered Dietitian III Clinical  Nutrition RD Pager and On-Call Pager Number Located in [place]

## 2020-04-20 NOTE — Progress Notes (Signed)
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  2/07 repeat bcx ngtd  2/07 right UE picc removed; placement left UE picc. ECMO catheter remains along with right radial a-line 2/07 TEE no valvular vegetation; ecmo catheter without thrombus/vegetation  ------ 2/08 assessment Still remains on high fio2 sweep on ecmo, 10L of oxygen Stable left hand dry gangrene No new fever Leukocytosis improved on targetted abx for e faecalis bacteremia. Repeat bcx ngtd  Tee no valve/ecmo catheter vegetation. If repeat bcx positive will need to revisit catheter exchange discussion including a-line and ecmo  Vancomycin chosen for e faecalis bsi due to lack of pk-pd data for beta-lactam  PLAN: 1. Continue vancomycin; goal 2 weeks from the last persistent negative blood culture 2. F/u repeat bcx  Active Problems:   COVID-19   Acute respiratory distress syndrome (ARDS) due to COVID-19 virus (HCC)   Acute respiratory failure with hypoxia (HCC)   Respiratory failure  (HCC)   Pressure injury of skin   Scheduled Meds: . sodium chloride   Intravenous Once  . acetaZOLAMIDE  250 mg Per Tube BID  . carvedilol  25 mg Oral BID WC  . chlorhexidine  15 mL Mouth Rinse BID  . Chlorhexidine Gluconate Cloth  6 each Topical Daily  . chlorpheniramine-HYDROcodone  5 mL Per Tube Q12H  . clonazePAM  1 mg Oral QID  . cloNIDine  0.1 mg Per Tube Q8H  . dextromethorphan  30 mg Per Tube TID  . docusate  100 mg Per Tube BID  . fiber  1 packet Per Tube BID  . free water  200 mL Per Tube Q4H  . furosemide  40 mg Intravenous BID  . gabapentin  900 mg Per Tube Q8H  . insulin aspart  0-20 Units Subcutaneous Q4H  . insulin aspart  9 Units Subcutaneous Q4H  . insulin detemir  45 Units Subcutaneous BID  . ipratropium-albuterol  3 mL Nebulization QID  . lactobacillus acidophilus  2 tablet Per Tube TID  . liver oil-zinc oxide   Topical 5 X Daily  . mouth rinse  15 mL Mouth Rinse q12n4p  . melatonin  3 mg Per Tube QHS  . metoCLOPramide (REGLAN) injection  5 mg Intravenous Q8H  . nutrition supplement (JUVEN)  1 packet Per Tube BID BM  . oxyCODONE  15 mg Per Tube Q6H  . pantoprazole sodium  40 mg Per Tube QHS  . QUEtiapine  100-300 mg Per Tube QHS  . sennosides  10 mL Per Tube QHS  . sodium chloride flush  10-40 mL Intracatheter Q12H  . sodium chloride flush  10-40 mL Intracatheter Q12H  Continuous Infusions: . sodium chloride    . sodium chloride Stopped (04/16/20 0934)  . sodium chloride 250 mL (04/07/20 2203)  . sodium chloride Stopped (03/12/20 0131)  . sodium chloride Stopped (04/19/20 1959)  . albumin human 12.5 g (04/11/20 1558)  . bivalirudin (ANGIOMAX) infusion 0.5 mg/mL (Non-ACS indications) 0.08 mg/kg/hr (04/20/20 0800)  . feeding supplement (PIVOT 1.5 CAL) 1,000 mL (04/19/20 2152)  . norepinephrine (LEVOPHED) Adult infusion Stopped (04/19/20 0146)  . vancomycin 200 mL/hr at 04/20/20 0800   PRN Meds:.Place/Maintain arterial line **AND** sodium chloride,  sodium chloride, sodium chloride, acetaminophen (TYLENOL) oral liquid 160 mg/5 mL, albumin human, [DISCONTINUED] lidocaine **AND** albuterol, Gerhardt's butt cream, guaiFENesin, hydrALAZINE, labetalol, lip balm, midazolam, morphine, morphine CONCENTRATE, ondansetron (ZOFRAN) IV, phenol, polyethylene glycol, simethicone, sodium chloride flush, sodium chloride flush   SUBJECTIVE: Patient followign commands per nursing Left hand gangrene/dry stable No fever Wbc improving Repeat bcx negative No diarrhea No n/v No new rash Remains on high o2 sweep on ecmo  Review of Systems: ROS Other ros negative  No Known Allergies  OBJECTIVE: Vitals:   04/20/20 0626 04/20/20 0700 04/20/20 0744 04/20/20 0800  BP:  114/78 114/78   Pulse:  (!) 103 (!) 105 (!) 103  Resp:  (!) 27 (!) 32 (!) 35  Temp:      TempSrc:      SpO2: 99% 97% 96% 99%  Weight:      Height:       Body mass index is 26.83 kg/m.  Physical Exam On ecmo veno-veno circuit; o2 set at 10 liters  Right ij ecmo catheter site no purulence/redness LUE picc site no purulence/redness Right radial a-line site no purulence/redness  Acutely ill appearing, eyes open but not much interaction or tracking Heent: normocephalic; conj clear; oropharynx clear cv rrr no mrg Lungs coarse bs/rhonchi bialteral abd soft/nondistended/nontender Ext no edema Skin left hand dry gangrene on fingers Neuro - unable to assess as patient per nuring exhausted from this morning (but was followign commands earlier)   Lab Results Lab Results  Component Value Date   WBC 12.0 (H) 04/20/2020   HGB 8.8 (L) 04/20/2020   HCT 26.0 (L) 04/20/2020   MCV 96.9 04/20/2020   PLT 197 04/20/2020    Lab Results  Component Value Date   CREATININE 0.79 04/20/2020   BUN 70 (H) 04/20/2020   NA 148 (H) 04/20/2020   K 3.6 04/20/2020   CL 110 04/20/2020   CO2 27 04/20/2020    Lab Results  Component Value Date   ALT 32 04/11/2020   AST 19 04/11/2020   ALKPHOS  143 (H) 04/11/2020   BILITOT 0.8 04/11/2020     Microbiology: Recent Results (from the past 240 hour(s))  Culture, respiratory (non-expectorated)     Status: None   Collection Time: 04/13/20  5:23 PM   Specimen: Tracheal Aspirate; Respiratory  Result Value Ref Range Status   Specimen Description TRACHEAL ASPIRATE  Final   Special Requests NONE  Final   Gram Stain   Final    MODERATE WBC PRESENT, PREDOMINANTLY PMN FEW GRAM POSITIVE RODS RARE GRAM POSITIVE COCCI    Culture   Final    FEW Normal respiratory flora-no Staph aureus or Pseudomonas seen Performed at [facility], [address] [place], [place] [address]    Report Status 04/16/2020 FINAL  Final  Culture, blood (routine x 2)     Status: Abnormal   Collection Time: 04/17/20  8:01 AM   Specimen: BLOOD LEFT  HAND  Result Value Ref Range Status   Specimen Description BLOOD LEFT HAND  Final   Special Requests   Final    BOTTLES DRAWN AEROBIC ONLY Blood Culture adequate volume   Culture  Setup Time   Final    GRAM POSITIVE COCCI IN CHAINS IN PAIRS AEROBIC BOTTLE ONLY CRITICAL VALUE NOTED.  VALUE IS CONSISTENT WITH PREVIOUSLY REPORTED AND CALLED VALUE.    Culture (A)  Final    ENTEROCOCCUS FAECALIS SUSCEPTIBILITIES PERFORMED ON PREVIOUS CULTURE WITHIN THE LAST 5 DAYS. Performed at [facility], [address], [place], [place] [address]    Report Status 04/19/2020 FINAL  Final  Culture, blood (routine x 2)     Status: Abnormal   Collection Time: 04/17/20  8:11 AM   Specimen: BLOOD LEFT WRIST  Result Value Ref Range Status   Specimen Description BLOOD LEFT WRIST  Final   Special Requests   Final    BOTTLES DRAWN AEROBIC AND ANAEROBIC Blood Culture adequate volume   Culture  Setup Time   Final    GRAM POSITIVE COCCI IN CHAINS IN BOTH AEROBIC AND ANAEROBIC BOTTLES CRITICAL RESULT CALLED TO, READ BACK BY AND VERIFIED WITH: Sheppard Coil PHARMD @2016  04/17/20 EB Performed at [facility], [address], [place], [place] [address]    Culture ENTEROCOCCUS FAECALIS (A)  Final   Report Status 04/19/2020 FINAL  Final   Organism ID, Bacteria ENTEROCOCCUS FAECALIS  Final      Susceptibility   Enterococcus faecalis - MIC*    AMPICILLIN <=2 SENSITIVE Sensitive     VANCOMYCIN 1 SENSITIVE Sensitive     GENTAMICIN SYNERGY SENSITIVE Sensitive     * ENTEROCOCCUS FAECALIS  Blood Culture ID Panel (Reflexed)     Status: Abnormal   Collection Time: 04/17/20  8:11 AM  Result Value Ref Range Status   Enterococcus faecalis DETECTED (A) NOT DETECTED Final    Comment: CRITICAL RESULT CALLED TO, READ BACK BY AND VERIFIED WITH: Sheppard Coil PHARMD @2016  04/17/20 EB    Enterococcus Faecium NOT DETECTED NOT DETECTED Final   Listeria monocytogenes NOT DETECTED NOT DETECTED Final   Staphylococcus species NOT DETECTED NOT DETECTED Final   Staphylococcus aureus (BCID) NOT DETECTED NOT DETECTED Final   Staphylococcus epidermidis NOT DETECTED NOT DETECTED Final   Staphylococcus lugdunensis NOT DETECTED NOT DETECTED Final   Streptococcus species NOT DETECTED NOT DETECTED Final   Streptococcus agalactiae NOT DETECTED NOT DETECTED Final   Streptococcus pneumoniae NOT DETECTED NOT DETECTED Final   Streptococcus pyogenes NOT DETECTED NOT DETECTED Final   A.calcoaceticus-baumannii NOT DETECTED NOT DETECTED Final   Bacteroides fragilis NOT DETECTED NOT DETECTED Final   Enterobacterales NOT DETECTED NOT DETECTED Final   Enterobacter cloacae complex NOT DETECTED NOT DETECTED Final   Escherichia coli NOT DETECTED NOT DETECTED Final   Klebsiella aerogenes NOT DETECTED NOT DETECTED Final   Klebsiella oxytoca NOT DETECTED NOT DETECTED Final   Klebsiella pneumoniae NOT DETECTED NOT DETECTED Final   Proteus species NOT DETECTED NOT DETECTED Final   Salmonella species NOT DETECTED NOT DETECTED Final   Serratia marcescens NOT DETECTED NOT DETECTED Final   Haemophilus influenzae NOT DETECTED NOT DETECTED Final   Neisseria  meningitidis NOT DETECTED NOT DETECTED Final   Pseudomonas aeruginosa NOT DETECTED NOT DETECTED Final   Stenotrophomonas maltophilia NOT DETECTED NOT DETECTED Final   Candida albicans NOT DETECTED NOT DETECTED Final   Candida auris NOT DETECTED NOT DETECTED Final   Candida glabrata NOT DETECTED  NOT DETECTED Final   Candida krusei NOT DETECTED NOT DETECTED Final   Candida parapsilosis NOT DETECTED NOT DETECTED Final   Candida tropicalis NOT DETECTED NOT DETECTED Final   Cryptococcus neoformans/gattii NOT DETECTED NOT DETECTED Final   Vancomycin resistance NOT DETECTED NOT DETECTED Final    Comment: Performed at [facility], [address], [place], [place] [address]    Serology: Hiv/hepatitis serology negative 12/27 covid pcr positive  Micro: 2/5 bcx e faecalis 2/7 bcx ngtd  Imaging: 2/01 ct chest reviewed 1. ECMO device is seen involving the right internal jugular vein and passing through the SVC, right atrium and into the IVC. Right-sided PICC line is seen with distal tip in expected position of cavoatrial junction. 2. Small right pleural effusion is noted. 3. Bronchiectasis is noted throughout both lung bases. Some degree of cystic bronchiectasis is noted in the left lower lobe. There remains diffuse opacity of both lungs consistent with pneumonia or inflammation. These findings are consistent with COVID-19 pneumonia as well as superimposed ARDS. 4. Stable 12 mm right paratracheal lymph node is noted which most likely is reactive in etiology.  2/07 tee 1. Left ventricular ejection fraction, by estimation, is 55 to 60%. The  left ventricle has normal function. The left ventricle has no regional  wall motion abnormalities.  2. Right ventricular systolic function is low normal. The right  ventricular size is normal.  3. No left atrial/left atrial appendage thrombus was detected.  4. ECMO cannula noted in the right atrium with flow directed towards the   tricuspid valve. There is a PICC line noted ending in the the RA just  beyond the SVC inlet, it is adjacent to the ECMO cannula. No vegetation  noted on PICC or ECMO cannula.  5. No aortic valve vegetation. The aortic valve is tricuspid. Aortic  valve regurgitation is not visualized. No aortic stenosis is present.  6. No pulmonic valve vegetation.  7. No mitral valve vegetation. The mitral valve is normal in structure.  Trivial mitral valve regurgitation. No evidence of mitral stenosis.  8. No tricuspid valve vegetation.  9. Small PFO noted by color doppler.   2/08 cxr Reviewed severe bilateral diffuse airspace opacity 1. Similar appearance of diffuse bilateral airspace disease although air bronchograms are now more evident. 2. New left PICC line tip overlies the proximal SVC level. 3. Interval removal right PICC line   Raymondo Band, MD [facility] [phone] pager    04/20/2020, 9:36 AM

## 2020-04-20 NOTE — Progress Notes (Signed)
ECMO PROGRESS NOTE  NAME:  Irene Mitcham, MRN:  [id number], DOB:  February 15, 1971, LOS: 42 ADMISSION DATE:  03/10/2020, CONSULTATION DATE: 03/16/2020 REFERRING MD: Wynona Neat -LBPCCM, CHIEF COMPLAINT: Respiratory failure requiring ECMO  HPI/course in hospital  50 year old man admitted to hospital 12/28 with 1 week history of dyspnea cough nausea and vomiting.  Initially admitted to [facility] hospital and placed on high flow nasal cannula but rapidly failed and required intubation 12/29.  Persistent hypoxic respiratory failure with PF ratio 55 in spite of 18 of PEEP FiO2 0.1 despite paralytics.  Did not improve with prone ventilation  Cannulated for VV ECMO 12/30 via right IJ crescent cannula.  ECMO circuit was changed on 04/07/2020 Iatrogenic pneumothorax from left subclavian triple-lumen placement  12/28 admitted covid, ARDS 12/30 ECMO cannulation, iatrogenic pneumothrorax, DVT, Bivalrudin started           Left subclavian hematoma, chest tube, ceftriaxone/ azithromycin started 12/31 1Unit PRBC 1/1 Palliative consult, noted HTN, fentanyl only 1/2 Extubation I/O positive, left lung re-expanded, EF 60-65%, Lasix 20mg  BID 1/3 BiPAP in place, HTN to 190s, 200s, labetolol for BP, I/O even, cefepime and vancomycin started 1/4 agitation off BiPAP, possible aspiration 1/5 agitation overnight, reintubated      ECHO with RV dilation, ECMO cannula repositioned, LUE DVT (+), 1/2 dose tPA given,      I/O up, on lasix, Epi, NE, vasopressin started, HR/ rhythm issues noted 1/6 Tracheostomy, hypoglycemic when off tube feeds       Cortrack tune issues 1/7 on lasix gtt 4mg /hr with diuresis, agitation improved 1/8 starting steroid taper 1/9 severe agitation, heavy sedation required, lasix gtt 6mg /hr, I/Os (-)       precedex and fentanyl 1/10 Vanc stopped 1/11 sweep to 2, lasix gtt at 4mg /hr, weight up, metoprolol added for HTN 1/12 fevers to 99 noted, lasix gtt and metazolone 1/13 lasix gtt dced 1/14  intermittent HTN, possibly flash pulmonary edema tied to sedation        Ischemic left hand changes, 1 Unit PRBCs 1/15 1/16 sweep from 4 to 2.5, chest tube removed 1/17 sweep at 6, trial of nebulized morphine for cough, LUE dopplers show no obstruction 1/18 two events of 2nd degree type II HB noted with coughing         sweep at 4, IV lasix 40, acetazolamide, distal XLT trach placement         agitation and BP spikes 1/19 agitation, air hunger, seroquel, klonopin, oxy, valproate, ketamine trial 1/20 sweep at 3.5, diuresis results in chugging, albumin given         1 Unit PRBCs, lidocaine nebulizers for cough, ancef started 1/21 sweep to 8, anxiety, I/Os (+) with lasix and acetazolamide 1/22 sweep at 5, chest CT bilateral upper lobe PEs 1/23 sweep at 5, delirium, I/Os even with lasix and acetazolamide         continued coughing, 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 1/24 sweep at 7.5, 1 Unit PRBCs, repeat bronchoscopy showed resolution of 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 1/25 sweep at 7.5, on propofol, lasix gtt at 46ml/hr, I/Os (+)         nebulized morphine and lidocaine for cough 1/26 sweep at 8, propofol/ precedex, cough present when weaned, lasix gtt at 38ml/hr        ECMO circuit changes, levophed started, 1 Unit PRBCs 1/27 sweep at 4, off sedation, delirium, I/Os even        lasix gtt restated, acetazolamide overnight 1/28 sweep at 7.5, HTN  labile 1/29 sweep of 6, patient reports a tickle in throat, cetacaine 1/30 sweep down to 6, cough improved with cetacaine and gabapentin 1/31 sweep at 5, agitation, off acetazolamide, lasix, weight up 2/1 cough, of pulmicort, cetacaine for cough, continued gabapentin, dexmethorphan       CT demonstrated trach placement against back of trachea 2/2 Trach collar trial somewhat effective in managing cough       low dose diltiazem for HR,  2/3 sweep at 4,trach cannula replacement (longer, more flexible bivona); brochoscopy shows  irritated mucosa       IV lasix and acetazolamide 2/4 sweep at 5, I/Os (+), BUN elevated, lasix gtt with diamox,       trach decannulation, increased gabapentin for cough 2/5 start HCAP coverage for rising WBC/temps, check Pct/cultures, CXR worse with lack of PEEP, diuresed well but started getting very hypotensive, sepsis vs. Too dry 04/18/20 blood cx grew E faecalis 2/7 PICC change, TEE  Past Medical History  none  Interim history/subjective:  Agitated with increased WOB this morning, better with scheduled meds.    Objective   Blood pressure 113/75, pulse (!) 112, temperature 98.4 F (36.9 C), temperature source Axillary, resp. rate (!) 26, height 5\' 4"  (1.626 m), weight 70.9 kg, SpO2 99 %.    Vent Mode: PCV;BIPAP FiO2 (%):  [40 %-98 %] 98 % Set Rate:  [8 bmp] 8 bmp PEEP:  [5 cmH20] 5 cmH20   Intake/Output Summary (Last 24 hours) at 04/20/2020 0710 Last data filed at 04/20/2020 0600 Gross per 24 hour  Intake 2867.1 ml  Output 3700 ml  Net -832.9 ml   Filed Weights   04/18/20 0600 04/19/20 0600 04/20/20 0426  Weight: 69.2 kg 71.2 kg 70.9 kg    Examination: Constitutional: ill appearing man laying in bed in NAD HEENT: [place]/AT, eyes anicteric Neck: trach site bandaged Cardiovascular: tachycardic, reg rhythm Respiratory: tachypnea, rhales L>R Gastrointestinal: soft, NT, ND Skin: no rashes, left hand bandaged Neurologic: awake, more alert, sluggishly responsive but answers y/n questions. Follows commands, globally weak. Can wiggle all toes but no plantar flexion on the left, present on right. Not moving LUE now, but had been moving both arms overnight. Psychiatric: cooperative with exam   Net  -800cc, net +3L for admission CXR> cavity in left base, improving opacities but still dense peripheral opacities with air bronchograms.  Assessment & Plan:  Acute hypoxemic/hypercapnic respiratory failure due to severe ARDS from COVID-19 pneumonia Probable acute PE, and RV dysfunction  s/p TPA Refractory coughing- improved after trach removal but still an issue Strep group F/ enterococcal pneumonia- s/p 10 days vanc 1/29 - Continue VV ECMO support, wean sweep as able, but limited with poor respiratory excursion - Current cough regimen:  dextromethorphan, gabapentin, plus ongoing opiates and benzos to control WOB - supplemental O2 via [place]; maintain oxygenation via ECMO circuit  HTN - Trials of reducing HR/BP have resulted in reduced pCO2 (likely related to fractional flow through ECMO circuit) - continue increased coreg. No longer on diltiazem due to hypotension -con't clonidine  Acute delirium, with agitation- improved -  RASS goal -1 to 0, PRN, morphine PRN, oxycodone 15mg  q6h, qHS seroquel - Change kloninpin to 1mg  qid - some meds on hold since he is so somnolent 2/2 sepsis  Sepsis secondary to E faecalis bacteremia- secondary to PNA, has grown enterococcus in sputum prior, previous vanc course completed 1/29. TEE on 2/7 without vegetations. 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  - wound following, appreciate dressing recommendations - surgical consult for L hand once more stable -keep Aline out of left arm   Poorly controlled diabetes type II -Increase levemir to 45 units BID, SSI  -increasing TF coverage to 9 units Q4h with hold parameters  Acute urinary retention -failed condom cath trial, foley replaced 2/7 - Hold on bethenacol (off since 1/30)  Hypernatremia, fluid balance- goal even with balanced diuresis-- resolved -con't to monitor  Deconditioning -increase mobility today now that sepsis is resolving   Daily Goals Checklist   Pain/Anxiety/Delirium protocol (if indicated): see above VAP protocol (if indicated): bundle in place.  DVT prophylaxis: bivalirudin GI prophylaxis: pantoprazole Glucose control: Euglcyemic on combination of SSI and Basal insulin. Mobility/therapy needs: Mobilization as tolerated, appreciate PT help Code Status: Full code Disposition: ICU.    This patient is critically ill with multiple organ system failure which requires frequent high complexity decision making, assessment, support, evaluation, and titration of therapies. This was completed through the application of advanced monitoring technologies and extensive interpretation of multiple databases. During this encounter critical care time was devoted to patient care services described in this note for 43 minutes.   2/30, DO 04/20/20 7:59 AM [facility]  From 7AM- 7PM if no response to pager, please call [phone]. After hours, 7PM- 7AM, please call Elink  [phone].

## 2020-04-21 ENCOUNTER — Inpatient Hospital Stay (HOSPITAL_COMMUNITY): Payer: Medicaid Other

## 2020-04-21 DIAGNOSIS — I2699 Other pulmonary embolism without acute cor pulmonale: Secondary | ICD-10-CM

## 2020-04-21 DIAGNOSIS — I96 Gangrene, not elsewhere classified: Secondary | ICD-10-CM

## 2020-04-21 DIAGNOSIS — R531 Weakness: Secondary | ICD-10-CM

## 2020-04-21 DIAGNOSIS — J80 Acute respiratory distress syndrome: Secondary | ICD-10-CM

## 2020-04-21 LAB — CBC
HCT: 28.4 % — ABNORMAL LOW (ref 39.0–52.0)
HCT: 27 % — ABNORMAL LOW (ref 39.0–52.0)
Hemoglobin: 8.9 g/dL — ABNORMAL LOW (ref 13.0–17.0)
Hemoglobin: 8.6 g/dL — ABNORMAL LOW (ref 13.0–17.0)
MCH: 29.6 pg (ref 26.0–34.0)
MCH: 30.4 pg (ref 26.0–34.0)
MCHC: 31.3 g/dL (ref 30.0–36.0)
MCHC: 31.9 g/dL (ref 30.0–36.0)
MCV: 94.4 fL (ref 80.0–100.0)
MCV: 95.4 fL (ref 80.0–100.0)
Platelets: 216 10*3/uL (ref 150–400)
Platelets: 204 10*3/uL (ref 150–400)
RBC: 3.01 MIL/uL — ABNORMAL LOW (ref 4.22–5.81)
RBC: 2.83 MIL/uL — ABNORMAL LOW (ref 4.22–5.81)
RDW: 19.6 % — ABNORMAL HIGH (ref 11.5–15.5)
RDW: 19.2 % — ABNORMAL HIGH (ref 11.5–15.5)
WBC: 13 10*3/uL — ABNORMAL HIGH (ref 4.0–10.5)
WBC: 13.7 10*3/uL — ABNORMAL HIGH (ref 4.0–10.5)
nRBC: 2.7 % — ABNORMAL HIGH (ref 0.0–0.2)
nRBC: 3.8 % — ABNORMAL HIGH (ref 0.0–0.2)

## 2020-04-21 LAB — POCT I-STAT 7, (LYTES, BLD GAS, ICA,H+H)
Acid-Base Excess: 4 mmol/L — ABNORMAL HIGH (ref 0.0–2.0)
Acid-Base Excess: 4 mmol/L — ABNORMAL HIGH (ref 0.0–2.0)
Acid-Base Excess: 5 mmol/L — ABNORMAL HIGH (ref 0.0–2.0)
Acid-Base Excess: 7 mmol/L — ABNORMAL HIGH (ref 0.0–2.0)
Acid-Base Excess: 2 mmol/L (ref 0.0–2.0)
Acid-Base Excess: 3 mmol/L — ABNORMAL HIGH (ref 0.0–2.0)
Bicarbonate: 29.9 mmol/L — ABNORMAL HIGH (ref 20.0–28.0)
Bicarbonate: 30.6 mmol/L — ABNORMAL HIGH (ref 20.0–28.0)
Bicarbonate: 31 mmol/L — ABNORMAL HIGH (ref 20.0–28.0)
Bicarbonate: 32.5 mmol/L — ABNORMAL HIGH (ref 20.0–28.0)
Bicarbonate: 29.1 mmol/L — ABNORMAL HIGH (ref 20.0–28.0)
Bicarbonate: 29.3 mmol/L — ABNORMAL HIGH (ref 20.0–28.0)
Calcium, Ion: 1.3 mmol/L (ref 1.15–1.40)
Calcium, Ion: 1.29 mmol/L (ref 1.15–1.40)
Calcium, Ion: 1.28 mmol/L (ref 1.15–1.40)
Calcium, Ion: 1.36 mmol/L (ref 1.15–1.40)
Calcium, Ion: 1.31 mmol/L (ref 1.15–1.40)
Calcium, Ion: 1.29 mmol/L (ref 1.15–1.40)
HCT: 27 % — ABNORMAL LOW (ref 39.0–52.0)
HCT: 28 % — ABNORMAL LOW (ref 39.0–52.0)
HCT: 25 % — ABNORMAL LOW (ref 39.0–52.0)
HCT: 25 % — ABNORMAL LOW (ref 39.0–52.0)
HCT: 26 % — ABNORMAL LOW (ref 39.0–52.0)
HCT: 27 % — ABNORMAL LOW (ref 39.0–52.0)
Hemoglobin: 9.2 g/dL — ABNORMAL LOW (ref 13.0–17.0)
Hemoglobin: 9.5 g/dL — ABNORMAL LOW (ref 13.0–17.0)
Hemoglobin: 8.5 g/dL — ABNORMAL LOW (ref 13.0–17.0)
Hemoglobin: 8.5 g/dL — ABNORMAL LOW (ref 13.0–17.0)
Hemoglobin: 8.8 g/dL — ABNORMAL LOW (ref 13.0–17.0)
Hemoglobin: 9.2 g/dL — ABNORMAL LOW (ref 13.0–17.0)
O2 Saturation: 92 %
O2 Saturation: 93 %
O2 Saturation: 100 %
O2 Saturation: 95 %
O2 Saturation: 57 %
O2 Saturation: 96 %
Patient temperature: 98.7
Patient temperature: 36.9
Patient temperature: 37
Patient temperature: 37
Patient temperature: 37
Patient temperature: 37
Potassium: 3.8 mmol/L (ref 3.5–5.1)
Potassium: 3.9 mmol/L (ref 3.5–5.1)
Potassium: 3.6 mmol/L (ref 3.5–5.1)
Potassium: 3.6 mmol/L (ref 3.5–5.1)
Potassium: 3.7 mmol/L (ref 3.5–5.1)
Potassium: 4.1 mmol/L (ref 3.5–5.1)
Sodium: 146 mmol/L — ABNORMAL HIGH (ref 135–145)
Sodium: 148 mmol/L — ABNORMAL HIGH (ref 135–145)
Sodium: 146 mmol/L — ABNORMAL HIGH (ref 135–145)
Sodium: 146 mmol/L — ABNORMAL HIGH (ref 135–145)
Sodium: 148 mmol/L — ABNORMAL HIGH (ref 135–145)
Sodium: 147 mmol/L — ABNORMAL HIGH (ref 135–145)
TCO2: 31 mmol/L (ref 22–32)
TCO2: 32 mmol/L (ref 22–32)
TCO2: 33 mmol/L — ABNORMAL HIGH (ref 22–32)
TCO2: 34 mmol/L — ABNORMAL HIGH (ref 22–32)
TCO2: 31 mmol/L (ref 22–32)
TCO2: 31 mmol/L (ref 22–32)
pCO2 arterial: 50.7 mmHg — ABNORMAL HIGH (ref 32.0–48.0)
pCO2 arterial: 53.3 mmHg — ABNORMAL HIGH (ref 32.0–48.0)
pCO2 arterial: 51.8 mmHg — ABNORMAL HIGH (ref 32.0–48.0)
pCO2 arterial: 53.1 mmHg — ABNORMAL HIGH (ref 32.0–48.0)
pCO2 arterial: 56.4 mmHg — ABNORMAL HIGH (ref 32.0–48.0)
pCO2 arterial: 53 mmHg — ABNORMAL HIGH (ref 32.0–48.0)
pH, Arterial: 7.38 (ref 7.350–7.450)
pH, Arterial: 7.367 (ref 7.350–7.450)
pH, Arterial: 7.375 (ref 7.350–7.450)
pH, Arterial: 7.406 (ref 7.350–7.450)
pH, Arterial: 7.321 — ABNORMAL LOW (ref 7.350–7.450)
pH, Arterial: 7.35 (ref 7.350–7.450)
pO2, Arterial: 65 mmHg — ABNORMAL LOW (ref 83.0–108.0)
pO2, Arterial: 69 mmHg — ABNORMAL LOW (ref 83.0–108.0)
pO2, Arterial: 438 mmHg — ABNORMAL HIGH (ref 83.0–108.0)
pO2, Arterial: 79 mmHg — ABNORMAL LOW (ref 83.0–108.0)
pO2, Arterial: 33 mmHg — CL (ref 83.0–108.0)
pO2, Arterial: 89 mmHg (ref 83.0–108.0)

## 2020-04-21 LAB — FIBRINOGEN: Fibrinogen: 303 mg/dL (ref 210–475)

## 2020-04-21 LAB — BASIC METABOLIC PANEL
Anion gap: 9 (ref 5–15)
Anion gap: 10 (ref 5–15)
BUN: 64 mg/dL — ABNORMAL HIGH (ref 6–20)
BUN: 67 mg/dL — ABNORMAL HIGH (ref 6–20)
CO2: 28 mmol/L (ref 22–32)
CO2: 28 mmol/L (ref 22–32)
Calcium: 8.9 mg/dL (ref 8.9–10.3)
Calcium: 8.9 mg/dL (ref 8.9–10.3)
Chloride: 108 mmol/L (ref 98–111)
Chloride: 108 mmol/L (ref 98–111)
Creatinine, Ser: 0.73 mg/dL (ref 0.61–1.24)
Creatinine, Ser: 0.64 mg/dL (ref 0.61–1.24)
GFR, Estimated: >60 mL/min (ref >60)
GFR, Estimated: >60 mL/min (ref >60)
Glucose, Bld: 197 mg/dL — ABNORMAL HIGH (ref 70–99)
Glucose, Bld: 154 mg/dL — ABNORMAL HIGH (ref 70–99)
Potassium: 3.7 mmol/L (ref 3.5–5.1)
Potassium: 3.7 mmol/L (ref 3.5–5.1)
Sodium: 145 mmol/L (ref 135–145)
Sodium: 146 mmol/L — ABNORMAL HIGH (ref 135–145)

## 2020-04-21 LAB — GLUCOSE, CAPILLARY
Glucose-Capillary: 173 mg/dL — ABNORMAL HIGH (ref 70–99)
Glucose-Capillary: 176 mg/dL — ABNORMAL HIGH (ref 70–99)
Glucose-Capillary: 149 mg/dL — ABNORMAL HIGH (ref 70–99)
Glucose-Capillary: 143 mg/dL — ABNORMAL HIGH (ref 70–99)
Glucose-Capillary: 112 mg/dL — ABNORMAL HIGH (ref 70–99)

## 2020-04-21 LAB — APTT
aPTT: 59 seconds — ABNORMAL HIGH (ref 24–36)
aPTT: 63 seconds — ABNORMAL HIGH (ref 24–36)

## 2020-04-21 LAB — LACTATE DEHYDROGENASE: LDH: 497 U/L — ABNORMAL HIGH (ref 98–192)

## 2020-04-21 LAB — LACTIC ACID, PLASMA
Lactic Acid, Venous: 1.1 mmol/L (ref 0.5–1.9)
Lactic Acid, Venous: 1 mmol/L (ref 0.5–1.9)

## 2020-04-21 LAB — PROTIME-INR
INR: 1.5 — ABNORMAL HIGH (ref 0.8–1.2)
Prothrombin Time: 17.8 seconds — ABNORMAL HIGH (ref 11.4–15.2)

## 2020-04-21 MED ORDER — POTASSIUM CHLORIDE 20 MEQ PO PACK
40.0000 meq | PACK | Freq: Once | ORAL | Status: AC
  Administered 2020-04-21: 40 meq
  Filled 2020-04-21: qty 2

## 2020-04-21 MED ORDER — CLONAZEPAM 1 MG PO TABS
1.0000 mg | ORAL_TABLET | Freq: Three times a day (TID) | ORAL | Status: DC
  Administered 2020-04-21 – 2020-04-23 (×9): 1 mg via ORAL
  Filled 2020-04-21 (×11): qty 1

## 2020-04-21 MED ORDER — FUROSEMIDE 10 MG/ML IJ SOLN
40.0000 mg | Freq: Two times a day (BID) | INTRAMUSCULAR | Status: AC
  Administered 2020-04-21 (×2): 40 mg via INTRAVENOUS
  Filled 2020-04-21 (×2): qty 4

## 2020-04-21 MED ORDER — INSULIN ASPART 100 UNIT/ML ~~LOC~~ SOLN
10.0000 [IU] | SUBCUTANEOUS | Status: DC
  Administered 2020-04-21 – 2020-04-27 (×22): 10 [IU] via SUBCUTANEOUS

## 2020-04-21 MED ORDER — BETHANECHOL CHLORIDE 10 MG PO TABS
10.0000 mg | ORAL_TABLET | Freq: Three times a day (TID) | ORAL | Status: DC
  Administered 2020-04-21 – 2020-05-05 (×45): 10 mg
  Filled 2020-04-21 (×46): qty 1

## 2020-04-21 MED ORDER — ATROPINE SULFATE 1 MG/10ML IJ SOSY
PREFILLED_SYRINGE | INTRAMUSCULAR | Status: AC
  Filled 2020-04-21: qty 10

## 2020-04-21 NOTE — Progress Notes (Signed)
Patient ID: Alex [name], male   DOB: 07/30/1970, 50 y.o.   MRN: [id number]  This NP visited patient at the bedside as a follow up for palliative medcien needs and emotional support.  Medical records reviewed.  Discussed with treatment team  49 y.o.malewith no significantpast medical history admitted on12/28/2021with dyspnea, cough, nausea/vomiting ~ 1 week ago with worsening symptoms of body aches and fatigue and +COVID 02/07/20 and admitted with shortness of breath.Required intubation 03/10/20. Cannulated for VV ECMO 03/21/20. Oxygenating better with ECMO. Also evidence of RLE DVT, RV thrombus, and high suspicion of PE. Has required chest tube to L lung for collapse which needs further reposition with recurrent collapse.  Today is day 82 of this hospitalization.  Patient successfully independent form   ventilator, remains on ECMO.      Patient is critically ill and making  slow prognosis, he continues to work with therapies    He remains  high risk for decompensation  I spoke to [place] Martin/patitnt's friend.   His son [name] will likely be moving to [place] sometime in the next day or 2 and at that time [name] will turn over right to make medical decisions for his father [name] to [name].  In speaking with Mr. Daphine Deutscher today he again confirms willingness to take on the responsibility of medical decision maker for his friend [name]  Patient's son/[name], patient's brother [name] and patient's friend Willaim Sheng continue to have ongoing conversation and support each other through  all decisions regarding treatment plan   Education offered today with  Zollie Beckers the importance of continued conversations with each other  and the medical providers regarding overall plan of care and treatment options,  ensuring decisions are within the context of the patients values and GOCs.  Patient is with seriousness illness and high risk for decompensation.  Questions and  concerns addressed   Discussed with bedside RN       PMT will continue to support holistically  Total time spent on the unit was 35 minutes  Greater than 50% of the time was spent in counseling and coordination of care  Lorinda Creed NP  Palliative Medicine Team Team Phone # [phone] Pager [phone]

## 2020-04-21 NOTE — Progress Notes (Signed)
ANTICOAGULATION CONSULT NOTE  Pharmacy Consult for bivalirudin Indication: ECMO and VTE  Labs: Recent Labs    04/19/20 0411 04/19/20 0631 04/20/20 0357 04/20/20 0404 04/20/20 1530 04/20/20 1538 04/20/20 2000 04/21/20 0402 04/21/20 0406  HGB 8.8*   < > 8.3*   < > 8.7*   < > 8.8* 8.9* 9.2*  HCT 29.0*   < > 28.1*   < > 30.0*   < > 26.0* 28.4* 27.0*  PLT 193   < > 197  --  206  --   --  216  --   APTT 61*   < > 66*  --  64*  --   --  59*  --   LABPROT 17.9*  --  18.1*  --   --   --   --  17.8*  --   INR 1.5*  --  1.6*  --   --   --   --  1.5*  --   CREATININE 0.79   < > 0.79  --  0.65  --   --  0.73  --    < > = values in this interval not displayed.    Assessment: 23 yoM admitted with COVID-19 PNA with worsening hypoxia, s/p cannulation for ECMO. Pt was started on IV heparin prior to cannulation due to acute DVTs and possible PE, transitioned to bivalirudin with ECMO. Now s/p tPA on 1/5 and tracheostomy on 1/6. Circuit last changed 1/26.  APTT is just slightly  subtherapeutic at 59 seconds, CBC stable, LDH fairly stable and fibrinogen decreasing. Circuit remains unchanged.   Goal of Therapy:  aPTT 60-80 seconds   Plan:  -Increase bivalirudin slightly to 0.083 mg/kg/hr (using order-specific wt 72.1kg) -Monitor q12h aPTT/CBC, LDH, and for s/sx of bleeding  Jenetta Downer, [facility] Clinical Pharmacist  04/21/2020 8:44 AM   [facility] pharmacy phone numbers are listed on [email or web address]

## 2020-04-21 NOTE — Progress Notes (Signed)
ANTICOAGULATION CONSULT NOTE  Pharmacy Consult for bivalirudin Indication: ECMO and VTE  Labs: Recent Labs    04/19/20 0411 04/19/20 0631 04/20/20 0357 04/20/20 0404 04/20/20 1530 04/20/20 1538 04/21/20 0402 04/21/20 0406 04/21/20 0759 04/21/20 1534 04/21/20 1603  HGB 8.8*   < > 8.3*   < > 8.7*   < > 8.9*   < > 9.5* 8.5* 8.6*  HCT 29.0*   < > 28.1*   < > 30.0*   < > 28.4*   < > 28.0* 25.0* 27.0*  PLT 193   < > 197  --  206  --  216  --   --   --  204  APTT 61*   < > 66*  --  64*  --  59*  --   --   --  63*  LABPROT 17.9*  --  18.1*  --   --   --  17.8*  --   --   --   --   INR 1.5*  --  1.6*  --   --   --  1.5*  --   --   --   --   CREATININE 0.79   < > 0.79  --  0.65  --  0.73  --   --   --  0.64   < > = values in this interval not displayed.    Assessment: 25 yoM admitted with COVID-19 PNA with worsening hypoxia, s/p cannulation for ECMO. Pt was started on IV heparin prior to cannulation due to acute DVTs and possible PE, transitioned to bivalirudin with ECMO. Now s/p tPA on 1/5 and tracheostomy on 1/6. Circuit last changed 1/26.  APTT came back therapeutic at 63, on bivalirudin 0.083 mg/kg/hr. Hgb 8.6, plt 204. Having bleeding in urine - will monitor closely. Circuit remains unchanged.   Goal of Therapy:  aPTT 60-80 seconds   Plan:  -Continue bivalirudin at 0.083 mg/kg/hr (using order-specific wt 72.1kg) -Monitor q12h aPTT/CBC, LDH, and for s/sx of bleeding  Sherron Monday, PharmD, BCCCP Clinical Pharmacist  Phone: [phone] 04/21/2020 5:41 PM  Please check AMION for all [facility] Pharmacy phone numbers After 10:00 PM, call Main Pharmacy [phone]

## 2020-04-21 NOTE — Progress Notes (Signed)
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  2/07 repeat bcx ngtd  2/07 right UE picc removed; placement left UE picc. ECMO catheter remains along with right radial a-line 2/07 TEE no valvular vegetation; ecmo catheter without thrombus/vegetation  ------ 2/09 assessment Severe covid; imaging severe parenchymal disease. Remains on high fio2 sweep on ecmo, 10L of oxygen Stable left hand dry gangrene No new fever  Enterococcus BSI s/p picc exchange. Tee no valve/ecmo catheter vegetation. If repeat bcx positive will need to revisit catheter exchange discussion including a-line and ecmo  Vancomycin chosen for e faecalis bsi due to lack of pk-pd data for beta-lactam  PLAN: 1. Continue vancomycin; goal 2 weeks from the last persistent negative blood culture 2. F/u repeat bcx  Active Problems:   COVID-19   Pneumonia due to COVID-19 virus   Acute respiratory failure with hypoxia (HCC)   Respiratory failure (HCC)   Pressure injury of skin   History  of extracorporeal membrane oxygenation   Bacteremia due to Enterococcus   Scheduled Meds: . sodium chloride   Intravenous Once  . acetaZOLAMIDE  250 mg Per Tube BID  . bethanechol  10 mg Per Tube TID  . carvedilol  25 mg Oral BID WC  . chlorhexidine  15 mL Mouth Rinse BID  . Chlorhexidine Gluconate Cloth  6 each Topical Daily  . chlorpheniramine-HYDROcodone  5 mL Per Tube Q12H  . clonazePAM  1 mg Oral TID  . cloNIDine  0.1 mg Per Tube Q8H  . dextromethorphan  30 mg Per Tube TID  . docusate  100 mg Per Tube BID  . fiber  1 packet Per Tube BID  . free water  200 mL Per Tube Q4H  . furosemide  40 mg Intravenous BID  . gabapentin  900 mg Per Tube Q8H  . insulin aspart  0-20 Units Subcutaneous Q4H  . insulin aspart  10 Units Subcutaneous Q4H  . insulin detemir  45 Units Subcutaneous BID  . ipratropium-albuterol  3 mL Nebulization QID  . lactobacillus acidophilus  2 tablet Per Tube TID  . liver oil-zinc oxide   Topical 5 X Daily  . mouth rinse  15 mL Mouth Rinse q12n4p  . melatonin  3 mg Per Tube QHS  . metoCLOPramide (REGLAN) injection  5 mg Intravenous Q8H  . nutrition supplement (JUVEN)  1 packet Per Tube BID BM  . oxyCODONE  15 mg Per Tube Q6H  . pantoprazole sodium  40 mg Per Tube QHS  . QUEtiapine  100-300 mg Per Tube QHS  . sennosides  10 mL Per Tube QHS  . sodium chloride flush  10-40  mL Intracatheter Q12H  . sodium chloride flush  10-40 mL Intracatheter Q12H   Continuous Infusions: . sodium chloride    . sodium chloride Stopped (04/16/20 0934)  . sodium chloride 250 mL (04/07/20 2203)  . sodium chloride Stopped (03/12/20 0131)  . sodium chloride Stopped (04/19/20 1959)  . albumin human 12.5 g (04/11/20 1558)  . bivalirudin (ANGIOMAX) infusion 0.5 mg/mL (Non-ACS indications) 0.083 mg/kg/hr (04/21/20 1200)  . feeding supplement (PIVOT 1.5 CAL) 1,000 mL (04/21/20 0604)  . norepinephrine (LEVOPHED) Adult infusion Stopped (04/19/20 0146)  . vancomycin Stopped (04/21/20  0925)   PRN Meds:.Place/Maintain arterial line **AND** sodium chloride, sodium chloride, sodium chloride, acetaminophen (TYLENOL) oral liquid 160 mg/5 mL, albumin human, [DISCONTINUED] lidocaine **AND** albuterol, Gerhardt's butt cream, guaiFENesin, hydrALAZINE, labetalol, lip balm, midazolam, morphine, morphine CONCENTRATE, ondansetron (ZOFRAN) IV, phenol, polyethylene glycol, promethazine-codeine, simethicone, sodium chloride flush, sodium chloride flush   SUBJECTIVE: Patient followign commands per nursing Left hand gangrene/dry stable No fever Wbc improving Repeat bcx negative No diarrhea No n/v No new rash Remains on high o2 sweep on ecmo  Review of Systems: ROS Other ros negative  No Known Allergies  OBJECTIVE: Vitals:   04/21/20 1100 04/21/20 1139 04/21/20 1200 04/21/20 1300  BP: 119/72  129/80 (!) 129/95  Pulse: (!) 106 (!) 108 (!) 105 (!) 106  Resp: (!) 30 (!) 24 (!) 27 (!) 21  Temp:      TempSrc:      SpO2: 98%  97% 100%  Weight:      Height:       Body mass index is 26.45 kg/m.  Physical Exam Same settings from 2/08 --> ecmo veno-veno circuit; o2 set at 10 liters  Right ij ecmo catheter site no purulence/redness LUE picc site no purulence/redness Right radial a-line site no purulence/redness  Acutely chronically ill appearing, eyes open; tracking and more cooperative today Heent: normocephalic; conj clear; oropharynx clear cv rrr no mrg Lungs coarse bs/rhonchi bialteral abd soft/nondistended/nontender Ext no edema Skin left hand dry gangrene on fingers Neuro - more tracking and following simple commands today 2/9  Lab Results Lab Results  Component Value Date   WBC 13.0 (H) 04/21/2020   HGB 9.5 (L) 04/21/2020   HCT 28.0 (L) 04/21/2020   MCV 94.4 04/21/2020   PLT 216 04/21/2020    Lab Results  Component Value Date   CREATININE 0.73 04/21/2020   BUN 64 (H) 04/21/2020   NA 148 (H) 04/21/2020   K 3.9 04/21/2020   CL 108 04/21/2020   CO2 28  04/21/2020    Lab Results  Component Value Date   ALT 32 04/11/2020   AST 19 04/11/2020   ALKPHOS 143 (H) 04/11/2020   BILITOT 0.8 04/11/2020     Microbiology: Recent Results (from the past 240 hour(s))  Culture, respiratory (non-expectorated)     Status: None   Collection Time: 04/13/20  5:23 PM   Specimen: Tracheal Aspirate; Respiratory  Result Value Ref Range Status   Specimen Description TRACHEAL ASPIRATE  Final   Special Requests NONE  Final   Gram Stain   Final    MODERATE WBC PRESENT, PREDOMINANTLY PMN FEW GRAM POSITIVE RODS RARE GRAM POSITIVE COCCI    Culture   Final    FEW Normal respiratory flora-no Staph aureus or Pseudomonas seen Performed at [facility], [address], [place], [place] [address]    Report Status 04/16/2020 FINAL  Final  Culture, blood (routine x 2)     Status: Abnormal  Collection Time: 04/17/20  8:01 AM   Specimen: BLOOD LEFT HAND  Result Value Ref Range Status   Specimen Description BLOOD LEFT HAND  Final   Special Requests   Final    BOTTLES DRAWN AEROBIC ONLY Blood Culture adequate volume   Culture  Setup Time   Final    GRAM POSITIVE COCCI IN CHAINS IN PAIRS AEROBIC BOTTLE ONLY CRITICAL VALUE NOTED.  VALUE IS CONSISTENT WITH PREVIOUSLY REPORTED AND CALLED VALUE.    Culture (A)  Final    ENTEROCOCCUS FAECALIS SUSCEPTIBILITIES PERFORMED ON PREVIOUS CULTURE WITHIN THE LAST 5 DAYS. Performed at [facility], [address], [place], [place] [address]    Report Status 04/19/2020 FINAL  Final  Culture, blood (routine x 2)     Status: Abnormal   Collection Time: 04/17/20  8:11 AM   Specimen: BLOOD LEFT WRIST  Result Value Ref Range Status   Specimen Description BLOOD LEFT WRIST  Final   Special Requests   Final    BOTTLES DRAWN AEROBIC AND ANAEROBIC Blood Culture adequate volume   Culture  Setup Time   Final    GRAM POSITIVE COCCI IN CHAINS IN BOTH AEROBIC AND ANAEROBIC BOTTLES CRITICAL RESULT CALLED TO, READ BACK BY  AND VERIFIED WITH: Sheppard Coil PHARMD @2016  04/17/20 EB Performed at [facility], [address], [place], [place] [address]    Culture ENTEROCOCCUS FAECALIS (A)  Final   Report Status 04/19/2020 FINAL  Final   Organism ID, Bacteria ENTEROCOCCUS FAECALIS  Final      Susceptibility   Enterococcus faecalis - MIC*    AMPICILLIN <=2 SENSITIVE Sensitive     VANCOMYCIN 1 SENSITIVE Sensitive     GENTAMICIN SYNERGY SENSITIVE Sensitive     * ENTEROCOCCUS FAECALIS  Blood Culture ID Panel (Reflexed)     Status: Abnormal   Collection Time: 04/17/20  8:11 AM  Result Value Ref Range Status   Enterococcus faecalis DETECTED (A) NOT DETECTED Final    Comment: CRITICAL RESULT CALLED TO, READ BACK BY AND VERIFIED WITH: Sheppard Coil PHARMD @2016  04/17/20 EB    Enterococcus Faecium NOT DETECTED NOT DETECTED Final   Listeria monocytogenes NOT DETECTED NOT DETECTED Final   Staphylococcus species NOT DETECTED NOT DETECTED Final   Staphylococcus aureus (BCID) NOT DETECTED NOT DETECTED Final   Staphylococcus epidermidis NOT DETECTED NOT DETECTED Final   Staphylococcus lugdunensis NOT DETECTED NOT DETECTED Final   Streptococcus species NOT DETECTED NOT DETECTED Final   Streptococcus agalactiae NOT DETECTED NOT DETECTED Final   Streptococcus pneumoniae NOT DETECTED NOT DETECTED Final   Streptococcus pyogenes NOT DETECTED NOT DETECTED Final   A.calcoaceticus-baumannii NOT DETECTED NOT DETECTED Final   Bacteroides fragilis NOT DETECTED NOT DETECTED Final   Enterobacterales NOT DETECTED NOT DETECTED Final   Enterobacter cloacae complex NOT DETECTED NOT DETECTED Final   Escherichia coli NOT DETECTED NOT DETECTED Final   Klebsiella aerogenes NOT DETECTED NOT DETECTED Final   Klebsiella oxytoca NOT DETECTED NOT DETECTED Final   Klebsiella pneumoniae NOT DETECTED NOT DETECTED Final   Proteus species NOT DETECTED NOT DETECTED Final   Salmonella species NOT DETECTED NOT DETECTED Final   Serratia marcescens  NOT DETECTED NOT DETECTED Final   Haemophilus influenzae NOT DETECTED NOT DETECTED Final   Neisseria meningitidis NOT DETECTED NOT DETECTED Final   Pseudomonas aeruginosa NOT DETECTED NOT DETECTED Final   Stenotrophomonas maltophilia NOT DETECTED NOT DETECTED Final   Candida albicans NOT DETECTED NOT DETECTED Final   Candida auris  NOT DETECTED NOT DETECTED Final   Candida glabrata NOT DETECTED NOT DETECTED Final   Candida krusei NOT DETECTED NOT DETECTED Final   Candida parapsilosis NOT DETECTED NOT DETECTED Final   Candida tropicalis NOT DETECTED NOT DETECTED Final   Cryptococcus neoformans/gattii NOT DETECTED NOT DETECTED Final   Vancomycin resistance NOT DETECTED NOT DETECTED Final    Comment: Performed at [facility], [address] [place], [place] [address]  Culture, blood (routine x 2)     Status: None (Preliminary result)   Collection Time: 04/19/20 10:40 AM   Specimen: BLOOD LEFT FOREARM  Result Value Ref Range Status   Specimen Description BLOOD LEFT FOREARM  Final   Special Requests   Final    BOTTLES DRAWN AEROBIC AND ANAEROBIC Blood Culture adequate volume   Culture   Final    NO GROWTH 2 DAYS Performed at [facility], [address] [place], [place] [address]    Report Status PENDING  Incomplete  Culture, blood (routine x 2)     Status: None (Preliminary result)   Collection Time: 04/19/20 12:20 PM   Specimen: BLOOD  Result Value Ref Range Status   Specimen Description BLOOD SITE NOT SPECIFIED  Final   Special Requests   Final    BOTTLES DRAWN AEROBIC AND ANAEROBIC Blood Culture results may not be optimal due to an excessive volume of blood received in culture bottles   Culture   Final    NO GROWTH 2 DAYS Performed at [facility], [address] [place], [place] [address]    Report Status PENDING  Incomplete    Serology: Hiv/hepatitis serology negative 12/27 covid pcr positive  Micro: 2/5 bcx e faecalis 2/7 bcx ngtd  Imaging: 2/01  ct chest reviewed 1. ECMO device is seen involving the right internal jugular vein and passing through the SVC, right atrium and into the IVC. Right-sided PICC line is seen with distal tip in expected position of cavoatrial junction. 2. Small right pleural effusion is noted. 3. Bronchiectasis is noted throughout both lung bases. Some degree of cystic bronchiectasis is noted in the left lower lobe. There remains diffuse opacity of both lungs consistent with pneumonia or inflammation. These findings are consistent with COVID-19 pneumonia as well as superimposed ARDS. 4. Stable 12 mm right paratracheal lymph node is noted which most likely is reactive in etiology.  2/07 tee 1. Left ventricular ejection fraction, by estimation, is 55 to 60%. The  left ventricle has normal function. The left ventricle has no regional  wall motion abnormalities.  2. Right ventricular systolic function is low normal. The right  ventricular size is normal.  3. No left atrial/left atrial appendage thrombus was detected.  4. ECMO cannula noted in the right atrium with flow directed towards the  tricuspid valve. There is a PICC line noted ending in the the RA just  beyond the SVC inlet, it is adjacent to the ECMO cannula. No vegetation  noted on PICC or ECMO cannula.  5. No aortic valve vegetation. The aortic valve is tricuspid. Aortic  valve regurgitation is not visualized. No aortic stenosis is present.  6. No pulmonic valve vegetation.  7. No mitral valve vegetation. The mitral valve is normal in structure.  Trivial mitral valve regurgitation. No evidence of mitral stenosis.  8. No tricuspid valve vegetation.  9. Small PFO noted by color doppler.   2/08 cxr Reviewed severe bilateral diffuse airspace opacity 1. Similar appearance of diffuse bilateral airspace  disease although air bronchograms are now more evident. 2. New left PICC line tip overlies the proximal SVC level. 3. Interval removal  right PICC line  2/09 cxr Reviewed Not any better. Almost complete white out bilaterally with air bronchogram   Raymondo Band, MD [facility] [phone] pager    04/21/2020, 1:15 PM

## 2020-04-21 NOTE — Progress Notes (Signed)
Assisted tele visit to patient with son, [name].  Vena Austria, RN

## 2020-04-21 NOTE — Progress Notes (Addendum)
RT called Dr. Chestine Spore.  Patient has had 425 output since 1900.  Venous pressures are increasing on the pump.  Per Dr. Chestine Spore we are to hold the diamox tonight.

## 2020-04-21 NOTE — Progress Notes (Signed)
Patient ID: Alex [name], male   DOB: Mar 05, 1971, 50 y.o.   MRN: [id number]     Advanced Heart Failure Rounding Note  PCP-Cardiologist: No primary care provider on file.   Subjective:    - 12/30: VV ECMO cannulation - 12/31: Left chest tube replaced - 1/2: Extubated. Echo with EF 60-65%, mildly dilated RV with mildly decreased systolic function.  - 1/4: Agitated, suspected aspiration.  Re-intubated.  - 1/5: ECMO cannula repositioned under TEE guidance. TEE showed moderately dilated/moderate-severely dysfunctional RV in setting of hypoxemia. LUE DVT found.  Patient got 1/2 dose of TPA due to initial concern for large PE.  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  - 1/6: Tracheostomy - 1/7: Echo with mild RV dilation/mild RV dysfunction.  - 1/16: Left chest tube out - 1/17: LUE arterial dopplers repeated, showed no obstruction.  - 1/20: Echo with EF 65-70%, mildly D-shaped septum, mildly dilated and mildly dysfunctional RV.  - 1/22: CTA chest: Bilateral upper lobe PEs (suspect chronic), changes c/w ARDS - 1/23: Bronchoscopy showed semi-occlusive ?mass/polyp in the trachea.  - 1/24: Bronchoscopy showed resolution of 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 - 1/26: ECMO circuit changed.  - 2/1: CT chest showed diffuse bronchiectasis as well as diffuse opacity consistent with COVID-19 PNA with ARDS. - 2/3: Trach exchange - 2/5: Extubate to HFNC - 2/6: Enterococcal bacteremia.  Echo showed EF 65-70%, normal-appearing RV, no vegetation noted.  - 2/7: TEE with no vegetation, EF 55-60%, RV low normal function with normal size.   Sweep 9 today, remains on HFNC 10L.  Awake on vent.  CXR with severe bilateral airspace disease, no improvement.   WBCs 13, PCT 14.5 => 8.43, afebrile. He is on vancomycin for Enterococcus faecalis in blood cultures.   I/Os negative with 2 dose 40 mg IV Lasix + Diamox.    Has been working with PT and speech, able to speak some. Cough continues.   ECMO  parameters: 3600 rpm Flow 4.93 L/min Pvenous -93 Delta P 37 Sweep 9 HFNC 10 L  ABG 7.38/51/65/92% LDH  534 => 488 => 547 => 461 => 474 => 489 => 553 => 449 => 457 => 421 => 464 => 469 => 432 => 497 PTT 59 Lactate 1.1  Objective:   Weight Range: 69.9 kg Body mass index is 26.45 kg/m.   Vital Signs:   Temp:  [98.5 F (36.9 C)-98.7 F (37.1 C)] 98.7 F (37.1 C) (02/09 0400) Pulse Rate:  [92-117] 109 (02/09 0700) Resp:  [14-55] 34 (02/09 0700) BP: (91-154)/(52-106) 134/73 (02/09 0700) SpO2:  [89 %-100 %] 94 % (02/09 0700) Arterial Line BP: (90-207)/(45-110) 182/72 (02/09 0700) Weight:  [69.9 kg] 69.9 kg (02/09 0600) Last BM Date: 04/19/20  Weight change: Filed Weights   04/19/20 0600 04/20/20 0426 04/21/20 0600  Weight: 71.2 kg 70.9 kg 69.9 kg    Intake/Output:   Intake/Output Summary (Last 24 hours) at 04/21/2020 0741 Last data filed at 04/21/2020 0700 Gross per 24 hour  Intake 2558.86 ml  Output 3525 ml  Net -966.14 ml      Physical Exam    General: NAD Neck: No JVD, no thyromegaly or thyroid nodule.  Lungs: Crackles bilaterally.  CV: Nondisplaced PMI.  Heart regular S1/S2, no S3/S4, no murmur.  No peripheral edema.   Abdomen: Soft, nontender, no hepatosplenomegaly, no distention.  Skin: Intact without lesions or rashes.  Neurologic: Alert, follows commands. Extremities: Dry gangrene fingers left hand.   HEENT: Normal.    Telemetry  NSR 100s Personally reviewed   Labs    CBC Recent Labs    04/20/20 1530 04/20/20 1538 04/21/20 0402 04/21/20 0406  WBC 13.5*  --  13.0*  --   HGB 8.7*   < > 8.9* 9.2*  HCT 30.0*   < > 28.4* 27.0*  MCV 97.1  --  94.4  --   PLT 206  --  216  --    < > = values in this interval not displayed.   Basic Metabolic Panel Recent Labs    49/44/96 1530 04/20/20 1538 04/21/20 0402 04/21/20 0406  NA 147*   < > 145 146*  K 4.1   < > 3.7 3.8  CL 109  --  108  --   CO2 28  --  28  --   GLUCOSE 214*  --  197*  --    BUN 70*  --  64*  --   CREATININE 0.65  --  0.73  --   CALCIUM 8.9  --  8.9  --    < > = values in this interval not displayed.   Liver Function Tests No results for input(s): AST, ALT, ALKPHOS, BILITOT, PROT, ALBUMIN in the last 72 hours. No results for input(s): LIPASE, AMYLASE in the last 72 hours. Cardiac Enzymes No results for input(s): CKTOTAL, CKMB, CKMBINDEX, TROPONINI in the last 72 hours.  BNP: BNP (last 3 results) No results for input(s): BNP in the last 8760 hours.  ProBNP (last 3 results) No results for input(s): PROBNP in the last 8760 hours.   D-Dimer No results for input(s): DDIMER in the last 72 hours. Hemoglobin A1C No results for input(s): HGBA1C in the last 72 hours. Fasting Lipid Panel No results for input(s): CHOL, HDL, LDLCALC, TRIG, CHOLHDL, LDLDIRECT in the last 72 hours. Thyroid Function Tests No results for input(s): TSH, T4TOTAL, T3FREE, THYROIDAB in the last 72 hours.  Invalid input(s): FREET3  Other results:   Imaging    No results found.   Medications:     Scheduled Medications: . sodium chloride   Intravenous Once  . acetaZOLAMIDE  250 mg Per Tube BID  . carvedilol  25 mg Oral BID WC  . chlorhexidine  15 mL Mouth Rinse BID  . Chlorhexidine Gluconate Cloth  6 each Topical Daily  . chlorpheniramine-HYDROcodone  5 mL Per Tube Q12H  . clonazePAM  1 mg Oral QID  . cloNIDine  0.1 mg Per Tube Q8H  . dextromethorphan  30 mg Per Tube TID  . docusate  100 mg Per Tube BID  . fiber  1 packet Per Tube BID  . free water  200 mL Per Tube Q4H  . gabapentin  900 mg Per Tube Q8H  . insulin aspart  0-20 Units Subcutaneous Q4H  . insulin aspart  10 Units Subcutaneous Q4H  . insulin detemir  45 Units Subcutaneous BID  . ipratropium-albuterol  3 mL Nebulization QID  . lactobacillus acidophilus  2 tablet Per Tube TID  . liver oil-zinc oxide   Topical 5 X Daily  . mouth rinse  15 mL Mouth Rinse q12n4p  . melatonin  3 mg Per Tube QHS  .  metoCLOPramide (REGLAN) injection  5 mg Intravenous Q8H  . nutrition supplement (JUVEN)  1 packet Per Tube BID BM  . oxyCODONE  15 mg Per Tube Q6H  . pantoprazole sodium  40 mg Per Tube QHS  . potassium chloride  40 mEq Per Tube Once  . QUEtiapine  100-300  mg Per Tube QHS  . sennosides  10 mL Per Tube QHS  . sodium chloride flush  10-40 mL Intracatheter Q12H  . sodium chloride flush  10-40 mL Intracatheter Q12H    Infusions: . sodium chloride    . sodium chloride Stopped (04/16/20 0934)  . sodium chloride 250 mL (04/07/20 2203)  . sodium chloride Stopped (03/12/20 0131)  . sodium chloride Stopped (04/19/20 1959)  . albumin human 12.5 g (04/11/20 1558)  . bivalirudin (ANGIOMAX) infusion 0.5 mg/mL (Non-ACS indications) 0.08 mg/kg/hr (04/21/20 0700)  . feeding supplement (PIVOT 1.5 CAL) 1,000 mL (04/21/20 0604)  . norepinephrine (LEVOPHED) Adult infusion Stopped (04/19/20 0146)  . vancomycin Stopped (04/20/20 2148)    PRN Medications: Place/Maintain arterial line **AND** sodium chloride, sodium chloride, sodium chloride, acetaminophen (TYLENOL) oral liquid 160 mg/5 mL, albumin human, [DISCONTINUED] lidocaine **AND** albuterol, Gerhardt's butt cream, guaiFENesin, hydrALAZINE, labetalol, lip balm, midazolam, morphine, morphine CONCENTRATE, ondansetron (ZOFRAN) IV, phenol, polyethylene glycol, promethazine-codeine, simethicone, sodium chloride flush, sodium chloride flush   Assessment/Plan   1. Acute hypoxemic respiratory failure: Due to COVID-19 PNA with bilateral infiltrates.  Refractory hypoxemia, VV-ECMO cannulation on 02/17/2020 with improvement in oxygenation.  Developed left PTX post-subclavian CVL and had left chest tube, the left lung is re-expanded and CT out.  He was extubated 1/2 but reintubated 1/4 with agitation and suspected aspiration.  Tracheostomy 1/6.  CTA chest 1/22 with suspected chronic PEs and ARDS. ECMO cannula repositioned 1/5. ECMO circuit changed 1/26. Extubated to  HFNC on 2/5. LDH stable. Sweep at 9, have struggled with hypercarbia from significant dead space ventilation and had a set back with recurrent sepsis.  Restarted abx 2/5 with sepsis, Enterococcus faecalis, now on vancomycin.  I/Os negative with IV Lasix. CXR is a bit worse with extubation and loss of PEEP. I/Os mildly negative. - Continue to try to wean sweep, will allow lower pH.  - Lasix 40 mg IV bid again today with acetazolamide 250 bid.  - On vancomycin with Enterococcus.    - Patient has had remdesivir, tocilizumab. - Completed steroid taper. - Continue bivalirudin, goal PTT 65-80.  Discussed dosing with PharmD personally.  - Continue to mobilize  - Coreg increased to 25 mg bid to increase fractional flow via ECMO circuit.  - Will need to consider for lung transplantation, think we need to start thinking along this line once Enterococcal infection treated.  2. 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  Clot noted on TEE in RV as well.  TTE 1/2 showed normal EF 60-65%, RV improved (mildly dilated/dysfunctional). TEE on 1/5 with moderate to severe RV dysfunction but patient was hypoxemic.  Had 1/2 dose TPA on 1/5. Echo 1/20 with mildly dilated/mildly dysfunctional RV. CTA chest 1/22 with chronic-appearing PEs in upper lobes. - Bivalirudin for goal PTT 65-80.  Discussed dosing with PharmD personally. 3. Left PTX: Left chest tube, lung is re-expanded. Tube now out, stable CXR. 4. Shock: Suspect septic/distributive.  Now resolved, off NE.  5. Anemia: Hgb 8.9, transfuse < 8.    6. AKI: Stable renal indices, follow closely.   7. Hyperglycemia: insulin.  8. HTN: BP stable.     - On clonidine.  - Continue Coreg 25 mg bid.   - Suspect arterial line inaccurate, following cuff for now.  9. CHB: Episode of CHB when hypoxemic and with cough (suspect vagal).  NSR since then.    - Continue Coreg, watch rhythm.  10. Thrombocytopenia:  Resolved  11. Ileus: Resolved. TFs restarted.  - Getting Reglan  - Cor-trak repositioned to post-pyloric placement 12. Ischemic digits: LUE.  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  Repeat study 1/17 showed no obstruction.  - Wound Care following. 13. ID: Group F Strep and Enterococcus faecalis in sputum. Initially completed abx for these bacteria. Now with recurrent sepsis, Enterococcus faecalis in blood now. Vancomycin restarted. TEE 2/7 with no vegetation.  - Continue vancomycin.   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   15. Hypernatremia: Continue free water.   CRITICAL CARE Performed by: Marca Ancona  Total critical care time: 40 minutes  Critical care time was exclusive of separately billable procedures and treating other patients.  Critical care was necessary to treat or prevent imminent or life-threatening deterioration.  Critical care was time spent personally by me on the following activities: development of treatment plan with patient and/or surrogate as well as nursing, discussions with consultants, evaluation of patient's response to treatment, examination of patient, obtaining history from patient or surrogate, ordering and performing treatments and interventions, ordering and review of laboratory studies, ordering and review of radiographic studies, pulse oximetry and re-evaluation of patient's condition.    Length of Stay: 61  Marca Ancona, MD  04/21/2020, 7:41 AM  Advanced Heart Failure Team Pager [phone] (M-F; 7a - 4p)  Please contact [facility] for night-coverage after hours (4p -7a ) and weekends on [email or web address]

## 2020-04-21 NOTE — Procedures (Signed)
Extracorporeal support note  ECLS cannulation date: 03-15-2020 Last circuit change: 04/07/2020  Indication: Acute hypoxic respiratory failure due to ARDS from COVID-19 pneumonia with RV dysfunction.   Configuration: Venovenous  Drainage cannula: 32 French crescent cannula via right IJ Return cannula: Same  Pump speed: 3600 RPM Pump flow: 4.93 L/min Pump used: Cardio help  Oxygenator: Cardio help O2 blender: 100% Sweep gas: 9L  Circuit check: minimal clots in circuit Anticoagulant: Bivalirudin Anticoagulation targets: PTT 60-80  Changes in support:  None, increase mobility  Anticipated goals/duration of support: Bridge to recovery.  Multidisciplinary ECMO rounds completed.  Steffanie Dunn, DO 04/21/20 7:43 AM [facility]

## 2020-04-21 NOTE — Progress Notes (Signed)
ECMO PROGRESS NOTE  NAME:  Alex [name], MRN:  [id number], DOB:  [birth year], LOS: 43 ADMISSION DATE:  2020/04/08, CONSULTATION DATE: 02/24/2020 REFERRING MD: Wynona Neat -LBPCCM, CHIEF COMPLAINT: Respiratory failure requiring ECMO  HPI/course in hospital  50 year old man admitted to hospital 12/28 with 1 week history of dyspnea cough nausea and vomiting.  Initially admitted to [facility] long hospital and placed on high flow nasal cannula but rapidly failed and required intubation 12/29.  Persistent hypoxic respiratory failure with PF ratio 55 in spite of 18 of PEEP FiO2 0.1 despite paralytics.  Did not improve with prone ventilation  Cannulated for VV ECMO 12/30 via right IJ crescent cannula.  ECMO circuit was changed on 04/07/2020 Iatrogenic pneumothorax from left subclavian triple-lumen placement  12/28 admitted covid, ARDS 12/30 ECMO cannulation, iatrogenic pneumothrorax, DVT, Bivalrudin started           Left subclavian hematoma, chest tube, ceftriaxone/ azithromycin started 12/31 1Unit PRBC 1/1 Palliative consult, noted HTN, fentanyl only 1/2 Extubation I/O positive, left lung re-expanded, EF 60-65%, Lasix 20mg  BID 1/3 BiPAP in place, HTN to 190s, 200s, labetolol for BP, I/O even, cefepime and vancomycin started 1/4 agitation off BiPAP, possible aspiration 1/5 agitation overnight, reintubated      ECHO with RV dilation, ECMO cannula repositioned, LUE DVT (+), 1/2 dose tPA given,      I/O up, on lasix, Epi, NE, vasopressin started, HR/ rhythm issues noted 1/6 Tracheostomy, hypoglycemic when off tube feeds       Cortrack tune issues 1/7 on lasix gtt 4mg /hr with diuresis, agitation improved 1/8 starting steroid taper 1/9 severe agitation, heavy sedation required, lasix gtt 6mg /hr, I/Os (-)       precedex and fentanyl 1/10 Vanc stopped 1/11 sweep to 2, lasix gtt at 4mg /hr, weight up, metoprolol added for HTN 1/12 fevers to 99 noted, lasix gtt and metazolone 1/13 lasix gtt dced 1/14  intermittent HTN, possibly flash pulmonary edema tied to sedation        Ischemic left hand changes, 1 Unit PRBCs 1/15 1/16 sweep from 4 to 2.5, chest tube removed 1/17 sweep at 6, trial of nebulized morphine for cough, LUE dopplers show no obstruction 1/18 two events of 2nd degree type II HB noted with coughing         sweep at 4, IV lasix 40, acetazolamide, distal XLT trach placement         agitation and BP spikes 1/19 agitation, air hunger, seroquel, klonopin, oxy, valproate, ketamine trial 1/20 sweep at 3.5, diuresis results in chugging, albumin given         1 Unit PRBCs, lidocaine nebulizers for cough, ancef started 1/21 sweep to 8, anxiety, I/Os (+) with lasix and acetazolamide 1/22 sweep at 5, chest CT bilateral upper lobe PEs 1/23 sweep at 5, delirium, I/Os even with lasix and acetazolamide         continued coughing, 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 1/24 sweep at 7.5, 1 Unit PRBCs, repeat bronchoscopy showed resolution of 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 1/25 sweep at 7.5, on propofol, lasix gtt at 78ml/hr, I/Os (+)         nebulized morphine and lidocaine for cough 1/26 sweep at 8, propofol/ precedex, cough present when weaned, lasix gtt at 44ml/hr        ECMO circuit changes, levophed started, 1 Unit PRBCs 1/27 sweep at 4, off sedation, delirium, I/Os even        lasix gtt restated, acetazolamide overnight 1/28 sweep at 7.5, HTN  labile 1/29 sweep of 6, patient reports a tickle in throat, cetacaine 1/30 sweep down to 6, cough improved with cetacaine and gabapentin 1/31 sweep at 5, agitation, off acetazolamide, lasix, weight up 2/1 cough, of pulmicort, cetacaine for cough, continued gabapentin, dexmethorphan       CT demonstrated trach placement against back of trachea 2/2 Trach collar trial somewhat effective in managing cough       low dose diltiazem for HR,  2/3 sweep at 4,trach cannula replacement (longer, more flexible bivona); brochoscopy shows  irritated mucosa       IV lasix and acetazolamide 2/4 sweep at 5, I/Os (+), BUN elevated, lasix gtt with diamox,       trach decannulation, increased gabapentin for cough 2/5 start HCAP coverage for rising WBC/temps, check Pct/cultures, CXR worse with lack of PEEP, diuresed well but started getting very hypotensive, sepsis vs. Too dry 04/18/20 blood cx grew E faecalis 2/7 PICC change, TEE  Past Medical History  none  Interim history/subjective:  This morning feels sleepy, but denies other complaints. Had significant SOB and increased WOB yesterday- responded to increased sweep. Very tired after PT & OT.   Objective   Blood pressure 134/73, pulse (!) 109, temperature 98.7 F (37.1 C), temperature source Axillary, resp. rate (!) 34, height 5\' 4"  (1.626 m), weight 69.9 kg, SpO2 94 %.        Intake/Output Summary (Last 24 hours) at 04/21/2020 0717 Last data filed at 04/21/2020 0700 Gross per 24 hour  Intake 2558.86 ml  Output 3525 ml  Net -966.14 ml   Filed Weights   04/19/20 0600 04/20/20 0426 04/21/20 0600  Weight: 71.2 kg 70.9 kg 69.9 kg    Examination: Constitutional: critically ill appearing man laying in bed in NAD HEENT: [place]/AT, eyes anicteric Neck: trach site bandaged Cardiovascular: tachycardic, reg rhythm, no murmurs Respiratory: tachypneic, absent breath sounds, minimal coughing today Gastrointestinal: soft, NT, ND Skin: no rashes, wounds on left hand bandaged Neurologic: sleepy but arousable to verbal stimulation, globally weak Psychiatric: cooperative with exam, calm   Net  -1L, net +3.6L for admission CXR> cavity in left base, severe opacification of bilateral lung fields  Assessment & Plan:  Acute hypoxemic/hypercapnic respiratory failure due to severe ARDS from COVID-19 pneumonia Probable acute PE, and RV dysfunction s/p TPA Refractory coughing- improved after trach removal but still an issue Strep group F/ enterococcal pneumonia- s/p 10 days vanc 1/29 -  Continue VV ECMO support, wean sweep as able, but limited with poor respiratory excursion. Being off PPV may be limiting. - Current cough regimen:  dextromethorphan, gabapentin, plus ongoing opiates and benzos to control WOB -con't supplemental O2 via [place]; maintstay of respiratory support is ECMO circuit -IS, flutter, OOB mobility  HTN - Trials of reducing HR/BP have resulted in reduced pCO2 (likely related to fractional flow through ECMO circuit) -continue coreg. No longer on diltiazem due to hypotension; hopefully can restart in a few days -con't clonidine  Acute delirium, with agitation- improved - RASS goal -1 to 0, PRN, morphine PRN, oxycodone 15mg  q6h, qHS seroquel - Decrease kloninpin to 1mg  tid to help improve alertness during the day  Sepsis secondary to E faecalis bacteremia- secondary to PNA, has grown enterococcus in sputum prior, previous vanc course completed 1/29. TEE on 2/7 without vegetations. 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  - wound care following, appreciate dressing recommendations - surgical consult for L hand once more stable -keep A-line out of left arm   Poorly controlled diabetes type II -con't levemir 45 units BID, SSI  prn -increasing TF coverage to 10 units Q4h with hold parameters  Acute urinary retention -failed condom cath trial, foley replaced 2/7 - restart bethenacol (off since 1/30); pull foley tomorrow  Hypernatremia, fluid balance- goal even with balanced diuresis-- resolved -con't to monitor  Deconditioning -increase mobility today now that sepsis is resolving -con't working with PT, OT, SLP   Daily Goals Checklist   Pain/Anxiety/Delirium protocol (if indicated): see above VAP protocol (if indicated):   DVT prophylaxis: bivalirudin GI prophylaxis: pantoprazole Glucose control: Euglcyemic on basal bolus insulin Mobility/therapy needs: Mobilization as tolerated, appreciate PT's help Code Status: Full code Disposition: ICU   This patient is critically ill with multiple organ system failure which requires frequent high complexity decision making, assessment, support, evaluation, and titration of therapies. This was completed through the application of advanced monitoring technologies and extensive interpretation of multiple databases. During this encounter critical care time was devoted to patient care services described in this note for 44 minutes.   Steffanie Dunn, DO 04/21/20 8:01 AM [facility]  From 7AM- 7PM if no response to pager, please call [phone]. After hours, 7PM- 7AM, please call Elink  [phone].

## 2020-04-21 NOTE — Progress Notes (Addendum)
Patient started vigorously coughing.  Patient brady down to 38 but recovered quickly.  Atropine at bedside.

## 2020-04-21 NOTE — Progress Notes (Signed)
Occupational Therapy Treatment Patient Details Name: Alex [name] MRN: [id number] DOB: [birth year] Today's Date: 04/21/2020    History of present illness Pt is 50 y.o. male  with no significant past medical history admitted on 03/08/2020 with dyspnea, cough, nausea/vomiting ~ 1 week ago with worsening symptoms of body aches and fatigue and +COVID 02/07/20 and admitted with shortness of breath. Required intubation 03/10/20. Cannulated for VV ECMO 03/05/2020. Oxygenating better with ECMO. Also evidence of RLE DVT, RV thrombus, and high suspicion of PE. Has required chest tube to L lung for collapse which needs further reposition with recurrent collapse. Extubated 03-15-19.  Reintubated 1/5 and trach placed 1/6. Decannulated 04/16/20.   OT comments  Continued to work on positioning and retrograde massage to L hand to minimize swelling.  Hand positioned with pillows to encourage elevation.  ROM completed to L forearm, wrist, fingers and thumb.  Caution taken to DID on digit number 4 to L hand.  Splint placed and squeeze ball/theraband left.  Patient had just complete edge of bed sitting with nursing for nearly 30 minutes.  Increased fatigue noted.  Continue seeing patient in the acute setting, CIR continues to be recommended when appropriate.    Follow Up Recommendations  CIR;Supervision/Assistance - 24 hour    Equipment Recommendations       Recommendations for Other Services      Precautions / Restrictions Precautions Precautions: Fall Precaution Comments: ECMO, NGtube, Foley, Pt decannulated himself 2/4 Required Braces or Orthoses: Splint/Cast Restrictions Weight Bearing Restrictions: No              ADL either performed or assessed with clinical judgement   ADL Overall ADL's : Needs assistance/impaired         Upper Body Bathing: Total assistance;Bed level   Lower Body Bathing: Total assistance;Bed level   Upper Body Dressing : Total assistance;Bed level   Lower Body Dressing:  Total assistance;Bed level                                         Cognition Arousal/Alertness: Lethargic;Suspect due to medications Behavior During Therapy: Restless;Flat affect Overall Cognitive Status: Impaired/Different from baseline                                          Exercises Hand Exercises Forearm Supination: AAROM;Left;10 reps Forearm Pronation: AAROM;Left;10 reps Wrist Flexion: AAROM;Left;10 reps Wrist Extension: AAROM;Left;10 reps Digit Composite Flexion: PROM;Left;10 reps Composite Extension: PROM;Left;10 reps Thumb Abduction: PROM;Left;10 reps Thumb Adduction: PROM;Left;10 reps Other Exercises Other Exercises: gentle retrodgrade massage R/L hand with significant decrease in edema Other Exercises: L hand splint further modified to attempt to increase MP flexion and IP extension -- tried to use straps to better hold MP in flexion and IPs in ext.   Shoulder Instructions            Frequency  Min 2X/week        Progress Toward Goals  OT Goals(current goals can now be found in the care plan section)  Progress towards OT goals: Progressing toward goals  Acute Rehab OT Goals Patient Stated Goal: unable to state OT Goal Formulation: Patient unable to participate in goal setting Time For Goal Achievement: 04/30/20 Potential to Achieve Goals: Fair  Plan Discharge plan remains appropriate    Co-evaluation  AM-PAC OT "6 Clicks" Daily Activity     Outcome Measure   Help from another person eating meals?: Total Help from another person taking care of personal grooming?: Total Help from another person toileting, which includes using toliet, bedpan, or urinal?: Total Help from another person bathing (including washing, rinsing, drying)?: Total Help from another person to put on and taking off regular upper body clothing?: Total Help from another person to put on and taking off regular lower body  clothing?: Total 6 Click Score: 6    End of Session Equipment Utilized During Treatment: Oxygen  OT Visit Diagnosis: Muscle weakness (generalized) (M62.81);Other symptoms and signs involving cognitive function;Other abnormalities of gait and mobility (R26.89)   Activity Tolerance Patient limited by fatigue   Patient Left in bed;with call bell/phone within reach;with nursing/sitter in room   Nurse Communication          Time: 8341-9622 OT Time Calculation (min): 20 min  Charges: OT General Charges $OT Visit: 1 Visit OT Treatments $Therapeutic Exercise: 8-22 mins  04/21/2020  Rich, OTR/L  Acute Rehabilitation Services  Office:  [phone]    Suzanna Obey 04/21/2020, 2:14 PM

## 2020-04-22 ENCOUNTER — Inpatient Hospital Stay (HOSPITAL_COMMUNITY): Payer: Medicaid Other

## 2020-04-22 DIAGNOSIS — R339 Retention of urine, unspecified: Secondary | ICD-10-CM

## 2020-04-22 LAB — POCT I-STAT 7, (LYTES, BLD GAS, ICA,H+H)
Acid-Base Excess: 0 mmol/L (ref 0.0–2.0)
Acid-Base Excess: 0 mmol/L (ref 0.0–2.0)
Acid-Base Excess: 3 mmol/L — ABNORMAL HIGH (ref 0.0–2.0)
Acid-Base Excess: 3 mmol/L — ABNORMAL HIGH (ref 0.0–2.0)
Acid-Base Excess: 4 mmol/L — ABNORMAL HIGH (ref 0.0–2.0)
Acid-Base Excess: 3 mmol/L — ABNORMAL HIGH (ref 0.0–2.0)
Acid-Base Excess: 3 mmol/L — ABNORMAL HIGH (ref 0.0–2.0)
Acid-Base Excess: 4 mmol/L — ABNORMAL HIGH (ref 0.0–2.0)
Acid-Base Excess: 5 mmol/L — ABNORMAL HIGH (ref 0.0–2.0)
Bicarbonate: 25.6 mmol/L (ref 20.0–28.0)
Bicarbonate: 27.6 mmol/L (ref 20.0–28.0)
Bicarbonate: 29.4 mmol/L — ABNORMAL HIGH (ref 20.0–28.0)
Bicarbonate: 30.6 mmol/L — ABNORMAL HIGH (ref 20.0–28.0)
Bicarbonate: 31.1 mmol/L — ABNORMAL HIGH (ref 20.0–28.0)
Bicarbonate: 30.4 mmol/L — ABNORMAL HIGH (ref 20.0–28.0)
Bicarbonate: 30.5 mmol/L — ABNORMAL HIGH (ref 20.0–28.0)
Bicarbonate: 30.8 mmol/L — ABNORMAL HIGH (ref 20.0–28.0)
Bicarbonate: 30.9 mmol/L — ABNORMAL HIGH (ref 20.0–28.0)
Calcium, Ion: 1.22 mmol/L (ref 1.15–1.40)
Calcium, Ion: 1.26 mmol/L (ref 1.15–1.40)
Calcium, Ion: 1.29 mmol/L (ref 1.15–1.40)
Calcium, Ion: 1.3 mmol/L (ref 1.15–1.40)
Calcium, Ion: 1.3 mmol/L (ref 1.15–1.40)
Calcium, Ion: 1.31 mmol/L (ref 1.15–1.40)
Calcium, Ion: 1.33 mmol/L (ref 1.15–1.40)
Calcium, Ion: 1.32 mmol/L (ref 1.15–1.40)
Calcium, Ion: 1.3 mmol/L (ref 1.15–1.40)
HCT: 39 % (ref 39.0–52.0)
HCT: 26 % — ABNORMAL LOW (ref 39.0–52.0)
HCT: 26 % — ABNORMAL LOW (ref 39.0–52.0)
HCT: 27 % — ABNORMAL LOW (ref 39.0–52.0)
HCT: 29 % — ABNORMAL LOW (ref 39.0–52.0)
HCT: 26 % — ABNORMAL LOW (ref 39.0–52.0)
HCT: 27 % — ABNORMAL LOW (ref 39.0–52.0)
HCT: 28 % — ABNORMAL LOW (ref 39.0–52.0)
HCT: 26 % — ABNORMAL LOW (ref 39.0–52.0)
Hemoglobin: 13.3 g/dL (ref 13.0–17.0)
Hemoglobin: 8.8 g/dL — ABNORMAL LOW (ref 13.0–17.0)
Hemoglobin: 8.8 g/dL — ABNORMAL LOW (ref 13.0–17.0)
Hemoglobin: 9.2 g/dL — ABNORMAL LOW (ref 13.0–17.0)
Hemoglobin: 9.9 g/dL — ABNORMAL LOW (ref 13.0–17.0)
Hemoglobin: 8.8 g/dL — ABNORMAL LOW (ref 13.0–17.0)
Hemoglobin: 9.2 g/dL — ABNORMAL LOW (ref 13.0–17.0)
Hemoglobin: 9.5 g/dL — ABNORMAL LOW (ref 13.0–17.0)
Hemoglobin: 8.8 g/dL — ABNORMAL LOW (ref 13.0–17.0)
O2 Saturation: 94 %
O2 Saturation: 100 %
O2 Saturation: 69 %
O2 Saturation: 92 %
O2 Saturation: 95 %
O2 Saturation: 96 %
O2 Saturation: 97 %
O2 Saturation: 97 %
O2 Saturation: 95 %
Patient temperature: 98.7
Patient temperature: 37
Patient temperature: 37
Patient temperature: 37
Patient temperature: 37
Patient temperature: 36.9
Patient temperature: 37
Patient temperature: 37
Patient temperature: 37
Potassium: 3.5 mmol/L (ref 3.5–5.1)
Potassium: 3.6 mmol/L (ref 3.5–5.1)
Potassium: 3.9 mmol/L (ref 3.5–5.1)
Potassium: 4 mmol/L (ref 3.5–5.1)
Potassium: 4 mmol/L (ref 3.5–5.1)
Potassium: 4 mmol/L (ref 3.5–5.1)
Potassium: 4 mmol/L (ref 3.5–5.1)
Potassium: 4.2 mmol/L (ref 3.5–5.1)
Potassium: 3.9 mmol/L (ref 3.5–5.1)
Sodium: 147 mmol/L — ABNORMAL HIGH (ref 135–145)
Sodium: 146 mmol/L — ABNORMAL HIGH (ref 135–145)
Sodium: 147 mmol/L — ABNORMAL HIGH (ref 135–145)
Sodium: 147 mmol/L — ABNORMAL HIGH (ref 135–145)
Sodium: 148 mmol/L — ABNORMAL HIGH (ref 135–145)
Sodium: 146 mmol/L — ABNORMAL HIGH (ref 135–145)
Sodium: 146 mmol/L — ABNORMAL HIGH (ref 135–145)
Sodium: 145 mmol/L (ref 135–145)
Sodium: 146 mmol/L — ABNORMAL HIGH (ref 135–145)
TCO2: 27 mmol/L (ref 22–32)
TCO2: 29 mmol/L (ref 22–32)
TCO2: 31 mmol/L (ref 22–32)
TCO2: 32 mmol/L (ref 22–32)
TCO2: 33 mmol/L — ABNORMAL HIGH (ref 22–32)
TCO2: 32 mmol/L (ref 22–32)
TCO2: 32 mmol/L (ref 22–32)
TCO2: 33 mmol/L — ABNORMAL HIGH (ref 22–32)
TCO2: 33 mmol/L — ABNORMAL HIGH (ref 22–32)
pCO2 arterial: 45.9 mmHg (ref 32.0–48.0)
pCO2 arterial: 50.7 mmHg — ABNORMAL HIGH (ref 32.0–48.0)
pCO2 arterial: 59.7 mmHg — ABNORMAL HIGH (ref 32.0–48.0)
pCO2 arterial: 60.4 mmHg — ABNORMAL HIGH (ref 32.0–48.0)
pCO2 arterial: 62.5 mmHg — ABNORMAL HIGH (ref 32.0–48.0)
pCO2 arterial: 60.7 mmHg — ABNORMAL HIGH (ref 32.0–48.0)
pCO2 arterial: 61.8 mmHg — ABNORMAL HIGH (ref 32.0–48.0)
pCO2 arterial: 59 mmHg — ABNORMAL HIGH (ref 32.0–48.0)
pCO2 arterial: 54.7 mmHg — ABNORMAL HIGH (ref 32.0–48.0)
pH, Arterial: 7.355 (ref 7.350–7.450)
pH, Arterial: 7.273 — ABNORMAL LOW (ref 7.350–7.450)
pH, Arterial: 7.297 — ABNORMAL LOW (ref 7.350–7.450)
pH, Arterial: 7.32 — ABNORMAL LOW (ref 7.350–7.450)
pH, Arterial: 7.372 (ref 7.350–7.450)
pH, Arterial: 7.3 — ABNORMAL LOW (ref 7.350–7.450)
pH, Arterial: 7.309 — ABNORMAL LOW (ref 7.350–7.450)
pH, Arterial: 7.325 — ABNORMAL LOW (ref 7.350–7.450)
pH, Arterial: 7.359 (ref 7.350–7.450)
pO2, Arterial: 75 mmHg — ABNORMAL LOW (ref 83.0–108.0)
pO2, Arterial: 42 mmHg — ABNORMAL LOW (ref 83.0–108.0)
pO2, Arterial: 433 mmHg — ABNORMAL HIGH (ref 83.0–108.0)
pO2, Arterial: 68 mmHg — ABNORMAL LOW (ref 83.0–108.0)
pO2, Arterial: 85 mmHg (ref 83.0–108.0)
pO2, Arterial: 106 mmHg (ref 83.0–108.0)
pO2, Arterial: 91 mmHg (ref 83.0–108.0)
pO2, Arterial: 99 mmHg (ref 83.0–108.0)
pO2, Arterial: 81 mmHg — ABNORMAL LOW (ref 83.0–108.0)

## 2020-04-22 LAB — CBC
HCT: 28.8 % — ABNORMAL LOW (ref 39.0–52.0)
HCT: 30.4 % — ABNORMAL LOW (ref 39.0–52.0)
Hemoglobin: 8.7 g/dL — ABNORMAL LOW (ref 13.0–17.0)
Hemoglobin: 9 g/dL — ABNORMAL LOW (ref 13.0–17.0)
MCH: 28.9 pg (ref 26.0–34.0)
MCH: 29 pg (ref 26.0–34.0)
MCHC: 30.2 g/dL (ref 30.0–36.0)
MCHC: 29.6 g/dL — ABNORMAL LOW (ref 30.0–36.0)
MCV: 95.7 fL (ref 80.0–100.0)
MCV: 98.1 fL (ref 80.0–100.0)
Platelets: 216 10*3/uL (ref 150–400)
Platelets: 217 10*3/uL (ref 150–400)
RBC: 3.01 MIL/uL — ABNORMAL LOW (ref 4.22–5.81)
RBC: 3.1 MIL/uL — ABNORMAL LOW (ref 4.22–5.81)
RDW: 19.1 % — ABNORMAL HIGH (ref 11.5–15.5)
RDW: 18.8 % — ABNORMAL HIGH (ref 11.5–15.5)
WBC: 13.9 10*3/uL — ABNORMAL HIGH (ref 4.0–10.5)
WBC: 17.7 10*3/uL — ABNORMAL HIGH (ref 4.0–10.5)
nRBC: 2.8 % — ABNORMAL HIGH (ref 0.0–0.2)
nRBC: 2.2 % — ABNORMAL HIGH (ref 0.0–0.2)

## 2020-04-22 LAB — GLUCOSE, CAPILLARY
Glucose-Capillary: 127 mg/dL — ABNORMAL HIGH (ref 70–99)
Glucose-Capillary: 128 mg/dL — ABNORMAL HIGH (ref 70–99)
Glucose-Capillary: 129 mg/dL — ABNORMAL HIGH (ref 70–99)
Glucose-Capillary: 145 mg/dL — ABNORMAL HIGH (ref 70–99)
Glucose-Capillary: 169 mg/dL — ABNORMAL HIGH (ref 70–99)
Glucose-Capillary: 172 mg/dL — ABNORMAL HIGH (ref 70–99)
Glucose-Capillary: 176 mg/dL — ABNORMAL HIGH (ref 70–99)
Glucose-Capillary: 58 mg/dL — ABNORMAL LOW (ref 70–99)

## 2020-04-22 LAB — BASIC METABOLIC PANEL
Anion gap: 9 (ref 5–15)
Anion gap: 9 (ref 5–15)
BUN: 56 mg/dL — ABNORMAL HIGH (ref 6–20)
BUN: 60 mg/dL — ABNORMAL HIGH (ref 6–20)
CO2: 28 mmol/L (ref 22–32)
CO2: 28 mmol/L (ref 22–32)
Calcium: 8.8 mg/dL — ABNORMAL LOW (ref 8.9–10.3)
Calcium: 9 mg/dL (ref 8.9–10.3)
Chloride: 106 mmol/L (ref 98–111)
Chloride: 107 mmol/L (ref 98–111)
Creatinine, Ser: 0.65 mg/dL (ref 0.61–1.24)
Creatinine, Ser: 0.63 mg/dL (ref 0.61–1.24)
GFR, Estimated: >60 mL/min (ref >60)
GFR, Estimated: >60 mL/min (ref >60)
Glucose, Bld: 194 mg/dL — ABNORMAL HIGH (ref 70–99)
Glucose, Bld: 134 mg/dL — ABNORMAL HIGH (ref 70–99)
Potassium: 3.6 mmol/L (ref 3.5–5.1)
Potassium: 4.1 mmol/L (ref 3.5–5.1)
Sodium: 143 mmol/L (ref 135–145)
Sodium: 144 mmol/L (ref 135–145)

## 2020-04-22 LAB — URINALYSIS, ROUTINE W REFLEX MICROSCOPIC
Bilirubin Urine: NEGATIVE
Glucose, UA: NEGATIVE mg/dL
Ketones, ur: NEGATIVE mg/dL
Leukocytes,Ua: NEGATIVE
Nitrite: NEGATIVE
Protein, ur: NEGATIVE mg/dL
RBC / HPF: >50 RBC/hpf — ABNORMAL HIGH (ref 0–5)
Specific Gravity, Urine: 1.011 (ref 1.005–1.030)
pH: 7 (ref 5.0–8.0)

## 2020-04-22 LAB — PROTIME-INR
INR: 1.5 — ABNORMAL HIGH (ref 0.8–1.2)
Prothrombin Time: 17.5 seconds — ABNORMAL HIGH (ref 11.4–15.2)

## 2020-04-22 LAB — APTT
aPTT: 61 seconds — ABNORMAL HIGH (ref 24–36)
aPTT: 61 seconds — ABNORMAL HIGH (ref 24–36)

## 2020-04-22 LAB — LACTATE DEHYDROGENASE: LDH: 507 U/L — ABNORMAL HIGH (ref 98–192)

## 2020-04-22 LAB — LACTIC ACID, PLASMA
Lactic Acid, Venous: 1.3 mmol/L (ref 0.5–1.9)
Lactic Acid, Venous: 0.7 mmol/L (ref 0.5–1.9)

## 2020-04-22 LAB — FIBRINOGEN: Fibrinogen: 295 mg/dL (ref 210–475)

## 2020-04-22 LAB — PATHOLOGIST SMEAR REVIEW

## 2020-04-22 MED ORDER — DEXTROSE 50 % IV SOLN: 12.5000 g | INTRAVENOUS | Status: AC

## 2020-04-22 MED ORDER — ACETAZOLAMIDE 250 MG PO TABS
250.0000 mg | ORAL_TABLET | Freq: Every day | ORAL | Status: DC
  Administered 2020-04-22 – 2020-04-28 (×7): 250 mg
  Filled 2020-04-22 (×8): qty 1

## 2020-04-22 MED ORDER — IPRATROPIUM-ALBUTEROL 0.5-2.5 (3) MG/3ML IN SOLN
3.0000 mL | Freq: Three times a day (TID) | RESPIRATORY_TRACT | Status: DC
  Administered 2020-04-22 – 2020-04-25 (×9): 3 mL via RESPIRATORY_TRACT
  Filled 2020-04-22 (×10): qty 3

## 2020-04-22 MED ORDER — DEXTROSE 50 % IV SOLN
INTRAVENOUS | Status: AC
  Administered 2020-04-22: 12.5 g via INTRAVENOUS
  Filled 2020-04-22: qty 50

## 2020-04-22 MED ORDER — FUROSEMIDE 10 MG/ML IJ SOLN
40.0000 mg | Freq: Two times a day (BID) | INTRAMUSCULAR | Status: DC
  Administered 2020-04-22 (×2): 40 mg via INTRAVENOUS
  Filled 2020-04-22 (×2): qty 4

## 2020-04-22 MED ORDER — METOCLOPRAMIDE HCL 5 MG/ML IJ SOLN
10.0000 mg | Freq: Three times a day (TID) | INTRAMUSCULAR | Status: DC
  Administered 2020-04-22 – 2020-05-12 (×58): 10 mg via INTRAVENOUS
  Filled 2020-04-22 (×59): qty 2

## 2020-04-22 MED ORDER — MORPHINE SULFATE (CONCENTRATE) 10 MG/0.5ML PO SOLN: 20.0000 mg | ORAL | Status: DC | PRN

## 2020-04-22 MED ORDER — POTASSIUM CHLORIDE 20 MEQ PO PACK
40.0000 meq | PACK | Freq: Once | ORAL | Status: AC
  Administered 2020-04-22: 40 meq
  Filled 2020-04-22: qty 2

## 2020-04-22 NOTE — Progress Notes (Signed)
Patient states he has the urge to void but unable to void.  Foley removed earlier today.  Bladder scan demonstrated 558 ml in bladder.  Paged Dr. Chestine Spore New order to reinsert foley.  Removed 575 instantly when foley was inserted.  Urine pink tinged.

## 2020-04-22 NOTE — Procedures (Signed)
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   Configuration: Venovenous  Drainage cannula: 32 French crescent cannula via right IJ Return cannula: Same  Pump speed: 3600 RPM Pump flow: 4.95 L/min Pump used: Cardio help  Oxygenator: Cardio help O2 blender: 100% Sweep gas: 9L  Circuit check: minimal clots in circuit Anticoagulant: Bivalirudin Anticoagulation targets: PTT 60-80  Changes in support:  Continue increasing mobility. Urine culture, remove foley.  Anticipated goals/duration of support: Bridge to recovery.  Multidisciplinary ECMO rounds completed.  Steffanie Dunn, DO 04/22/20 8:02 AM [facility]

## 2020-04-22 NOTE — Progress Notes (Signed)
  Speech Language Pathology Treatment: Dysphagia  Patient Details Name: Alex [name] MRN: [id number] DOB: 12-Jan-1971 Today's Date: 04/22/2020 Time: 6754-4920 SLP Time Calculation (min) (ACUTE ONLY): 18 min  Assessment / Plan / Recommendation Clinical Impression  Pt is much more alert today, although still a little drowsy. He is verbally communicating more, primarily at the word/short phrase level, although at times he defaults to gestures still. SLP provided cues and encouragement throughout session to increase verbal output. Pt can sustain phonation for approximately one second with good volume, but current breath support does not support longer duration. Pt followed commands for oral motor exam today with generalized weakness noted. Pt does not swallow to command or with provision of small amounts of water via swab, so further PO trials were not administered today. Will continue to follow and advance as able.    HPI HPI: 50 year old man admitted to hospital 12/28 with 1 week history of dyspnea cough nausea and vomiting.  Initially admitted to [facility] long hospital and placed on high flow nasal cannula but rapidly failed and required intubation 12/29.  Persistent hypoxic respiratory failure with PF ratio 55 in spite of 18 of PEEP FiO2 0.1 despite paralytics.  Did not improve with prone ventilation. Cannulated for VV ECMO 12/30 via right IJ crescent cannula. Iatrogenic pneumothorax from left subclavian triple-lumen placement. Trach on 1/6. Decannulated 04/16/20      SLP Plan  Continue with current plan of care       Recommendations  Diet recommendations: NPO Medication Administration: Via alternative means                Oral Care Recommendations: Oral care QID Follow up Recommendations: Inpatient Rehab SLP Visit Diagnosis: Dysphagia, unspecified (R13.10) Plan: Continue with current plan of care       GO                Mahala Menghini., M.A. CCC-SLP Acute Rehabilitation  Services Pager [phone] Office [phone]  04/22/2020, 12:21 PM

## 2020-04-22 NOTE — Progress Notes (Signed)
Physical Therapy Treatment Patient Details Name: Alex [name] MRN: [id number] DOB: [birth year] Today's Date: 04/22/2020    History of Present Illness Pt is 50 y.o. male  with no significant past medical history admitted on 03-28-2020 with dyspnea, cough, nausea/vomiting ~ 1 week ago with worsening symptoms of body aches and fatigue and +COVID 02/07/20 and admitted with shortness of breath. Required intubation 03/10/20. Cannulated for VV ECMO 03/10/2020. Oxygenating better with ECMO. Also evidence of RLE DVT, RV thrombus, and high suspicion of PE. Has required chest tube to L lung for collapse which needs further reposition with recurrent collapse. Extubated 03-15-19.  Reintubated 1/5 and trach placed 1/6. Decannulated 04/16/20.    PT Comments    Pt admitted with above diagnosis. Pt was able to stand to [facility] with +2 mod assist today.  Pt also able to  sit at EOB on his own today for periods of 30 seconds.  Pt showing great progress today and was less anxious. Pt participates and gives his best.  Will continue.    Pt currently with functional limitations due to balance and endurance deficits. Pt will benefit from skilled PT to increase their independence and safety with mobility to allow discharge to the venue listed below.     Follow Up Recommendations  CIR;Supervision/Assistance - 24 hour     Equipment Recommendations  Other (comment) (TBD (pt still on ECMO))    Recommendations for Other Services       Precautions / Restrictions Precautions Precautions: Fall Precaution Comments: ECMO, NGtube, Foley, Pt decannulated himself 2/4 Required Braces or Orthoses: Splint/Cast Splint/Cast:  (B Prevalon boots) Restrictions Weight Bearing Restrictions: No    Mobility  Bed Mobility Overal bed mobility: Needs Assistance Bed Mobility: Supine to Sit;Sit to Supine Rolling: Max assist;+2 for physical assistance;+2 for safety/equipment Sidelying to sit: Total assist;+2 for physical assistance Supine to  sit: Total assist;+2 for physical assistance   Sit to sidelying: Total assist;+2 for physical assistance (extra 2 persons for lines) General bed mobility comments: Assist for LES and for trunk due to lines    Transfers Overall transfer level: Needs assistance   Transfers: Sit to/from Stand Sit to Stand: +2 physical assistance;From elevated surface;Mod assist;+2 safety/equipment;Max assist (3rd person for lines)         General transfer comment: Pt stood to [facility] 3 x for 30 seconds, then 10 seconds, then 1 min 20 sec.  Pt did collapse knees into knee pad each rest break and needed constant cues and assist to maintain sitting on the [place] but was able to stand several times with assist.  Ambulation/Gait             General Gait Details: TBA   Stairs             Wheelchair Mobility    Modified Rankin (Stroke Patients Only)       Balance Overall balance assessment: Needs assistance Sitting-balance support: Feet supported;Bilateral upper extremity supported Sitting balance-Leahy Scale: Poor Sitting balance - Comments: Varied from min guard A-mod A; Sat EOB 30 minutes; Pt performed neck extension, LE LAQ with ability to sustain left LE into full knee extension for up to 5 seconds at least once each LE.  Today pt had first attempts to use UEs to assist with sitting balance once PT placed the UEs on bed; Pt sat without any assist with min guard assist for 20 seconds, 25 seconds, 30 seconds and 25 seconds. Postural control: Left lateral lean Standing balance support: Bilateral upper extremity supported;During  functional activity Standing balance-Leahy Scale: Poor Standing balance comment: Stood to [facility] with +3 mod to max assist to power up and mod to min assist once standing.                            Cognition Arousal/Alertness: Awake/alert Behavior During Therapy: Restless;Flat affect Overall Cognitive Status: Impaired/Different from baseline                                  General Comments: inconsistently following 1 step commands      Exercises General Exercises - Upper Extremity Shoulder Flexion: AAROM;Both;10 reps;Supine Shoulder ABduction: PROM;Both;10 reps;Supine Elbow Flexion: Both;10 reps;AAROM;Supine Elbow Extension: Both;10 reps;AAROM;Supine Wrist Flexion: AROM;AAROM;Both;10 reps;Seated Wrist Extension: PROM;Both;10 reps;Supine Digit Composite Flexion: Both;10 reps;Supine;PROM Composite Extension: Both;10 reps;Supine;PROM General Exercises - Lower Extremity Ankle Circles/Pumps: PROM;Both;10 reps;Supine Quad Sets: AROM;Both;10 reps;Supine Gluteal Sets: AROM;Both;10 reps;Supine Short Arc Quad: PROM;Both;10 reps;Supine Long Arc Quad: AROM;Both;10 reps;Seated Heel Slides: Both;10 reps;Supine;AAROM Hip ABduction/ADduction: Both;Supine;AAROM;5 reps Straight Leg Raises: PROM;Both;10 reps;Supine Hip Flexion/Marching: PROM;Both;10 reps;Supine Shoulder Exercises Shoulder Flexion: AROM Elbow Flexion: Both;10 reps;Supine;PROM Elbow Extension: PROM;Both;Supine;10 reps Wrist Flexion: PROM;Both;10 reps;Supine Wrist Extension: PROM;Both;10 reps;Supine Digit Composite Flexion: PROM;Right;10 reps;Supine Composite Extension: PROM;Right;10 reps;Supine Hand Exercises Forearm Supination: AAROM;Left;10 reps Forearm Pronation: AAROM;Left;10 reps Wrist Flexion: AAROM;Left;10 reps Wrist Extension: AAROM;Left;10 reps Digit Composite Flexion: PROM;Left;10 reps Composite Extension: PROM;Left;10 reps Thumb Abduction: PROM;Left;10 reps Thumb Adduction: PROM;Left;10 reps Other Exercises Other Exercises: gentle retrodgrade massage R/L hand with significant decrease in edema Other Exercises: L hand splint further modified to attempt to increase MP flexion and IP extension -- tried to use straps to better hold MP in flexion and IPs in ext.    General Comments        Pertinent Vitals/Pain Pain Assessment: No/denies  pain Faces Pain Scale: No hurt Pain Location: nose Pain Descriptors / Indicators: Discomfort;Grimacing;Guarding Pain Intervention(s): Monitored during session    Home Living                      Prior Function            PT Goals (current goals can now be found in the care plan section) Acute Rehab PT Goals Patient Stated Goal: unable to state PT Goal Formulation: With patient Time For Goal Achievement: 04/26/20 Potential to Achieve Goals: Fair Progress towards PT goals: Progressing toward goals    Frequency    Min 3X/week      PT Plan Current plan remains appropriate    Co-evaluation PT/OT/SLP Co-Evaluation/Treatment: Yes            AM-PAC PT "6 Clicks" Mobility   Outcome Measure  Help needed turning from your back to your side while in a flat bed without using bedrails?: Total Help needed moving from lying on your back to sitting on the side of a flat bed without using bedrails?: Total Help needed moving to and from a bed to a chair (including a wheelchair)?: Total Help needed standing up from a chair using your arms (e.g., wheelchair or bedside chair)?: Total Help needed to walk in hospital room?: Total Help needed climbing 3-5 steps with a railing? : Total 6 Click Score: 6    End of Session Equipment Utilized During Treatment: Gait belt Activity Tolerance: Patient limited by fatigue Patient left: in bed;with nursing/sitter in room;with call bell/phone within reach Nurse Communication: Mobility  status PT Visit Diagnosis: Muscle weakness (generalized) (M62.81);Pain Pain - part of body:  (chest)     Time: 1610-9604 PT Time Calculation (min) (ACUTE ONLY): 32 min  Charges:  $Therapeutic Exercise: 8-22 mins $Therapeutic Activity: 8-22 mins                     Baila Rouse W,PT Acute Rehabilitation Services Pager:  [phone]  Office:  [phone]     Berline Lopes 04/22/2020, 2:23 PM

## 2020-04-22 NOTE — Progress Notes (Signed)
Patient's BG 58 adminitered 12.5g of dextrose per hypoglycermia protocol.  Patient's BG now 127.  Paitent denies nausea resumed Tube feeds.

## 2020-04-22 NOTE — Progress Notes (Signed)
ANTICOAGULATION CONSULT NOTE  Pharmacy Consult for bivalirudin Indication: ECMO and VTE  Labs: Recent Labs    04/20/20 0357 04/20/20 0404 04/21/20 0402 04/21/20 0406 04/21/20 1603 04/21/20 1620 04/22/20 0440 04/22/20 0930 04/22/20 1011 04/22/20 1031 04/22/20 1112  HGB 8.3*   < > 8.9*   < > 8.6*   < > 8.7*   < > 9.2* 8.8* 9.2*  HCT 28.1*   < > 28.4*   < > 27.0*   < > 28.8*   < > 27.0* 26.0* 27.0*  PLT 197   < > 216  --  204  --  216  --   --   --   --   APTT 66*   < > 59*  --  63*  --  61*  --   --   --   --   LABPROT 18.1*  --  17.8*  --   --   --  17.5*  --   --   --   --   INR 1.6*  --  1.5*  --   --   --  1.5*  --   --   --   --   CREATININE 0.79   < > 0.73  --  0.64  --  0.65  --   --   --   --    < > = values in this interval not displayed.    Assessment: 68 yoM admitted with COVID-19 PNA with worsening hypoxia, s/p cannulation for ECMO. Pt was started on IV heparin prior to cannulation due to acute DVTs and possible PE, transitioned to bivalirudin with ECMO. Now s/p tPA on 1/5 and tracheostomy on 1/6. Circuit last changed 1/26.  APTT therapeutic at 61, on bivalirudin 0.083 mg/kg/hr. Hgb 8.7, plt 216. Having bleeding in urine - will monitor closely.  Pulling foley today. Circuit remains unchanged.   Goal of Therapy:  aPTT 60-80 seconds   Plan:  -Continue bivalirudin at 0.083 mg/kg/hr (using order-specific wt 72.1kg) -Monitor q12h aPTT/CBC, LDH, and for s/sx of bleeding  Jenetta Downer, [facility] Clinical Pharmacist  04/22/2020 12:46 PM   [facility] pharmacy phone numbers are listed on [email or web address]

## 2020-04-22 NOTE — Progress Notes (Signed)
Patient coughing vigorously. Patient brady down 48 but recovered quickly.  Administered phenergan w/codeine (see MAR) for cough

## 2020-04-22 NOTE — Progress Notes (Signed)
ECMO PROGRESS NOTE  NAME:  Alex [name], MRN:  [id number], DOB:  12-15-1970, LOS: 44 ADMISSION DATE:  02/28/2020, CONSULTATION DATE: 2020-03-16 REFERRING MD: Wynona Neat -LBPCCM, CHIEF COMPLAINT: Respiratory failure requiring ECMO  HPI/course in hospital  50 year old man admitted to hospital 12/28 with 1 week history of dyspnea cough nausea and vomiting.  Initially admitted to [facility] hospital and placed on high flow nasal cannula but rapidly failed and required intubation 12/29.  Persistent hypoxic respiratory failure with PF ratio 55 in spite of 18 of PEEP FiO2 0.1 despite paralytics.  Did not improve with prone ventilation  Cannulated for VV ECMO 12/30 via right IJ crescent cannula.  ECMO circuit was changed on 04/07/2020 Iatrogenic pneumothorax from left subclavian triple-lumen placement  12/28 admitted covid, ARDS 12/30 ECMO cannulation, iatrogenic pneumothrorax, DVT, Bivalrudin started           Left subclavian hematoma, chest tube, ceftriaxone/ azithromycin started 12/31 1Unit PRBC 1/1 Palliative consult, noted HTN, fentanyl only 1/2 Extubation I/O positive, left lung re-expanded, EF 60-65%, Lasix 20mg  BID 1/3 BiPAP in place, HTN to 190s, 200s, labetolol for BP, I/O even, cefepime and vancomycin started 1/4 agitation off BiPAP, possible aspiration 1/5 agitation overnight, reintubated      ECHO with RV dilation, ECMO cannula repositioned, LUE DVT (+), 1/2 dose tPA given,      I/O up, on lasix, Epi, NE, vasopressin started, HR/ rhythm issues noted 1/6 Tracheostomy, hypoglycemic when off tube feeds       Cortrack tune issues 1/7 on lasix gtt 4mg /hr with diuresis, agitation improved 1/8 starting steroid taper 1/9 severe agitation, heavy sedation required, lasix gtt 6mg /hr, I/Os (-)       precedex and fentanyl 1/10 Vanc stopped 1/11 sweep to 2, lasix gtt at 4mg /hr, weight up, metoprolol added for HTN 1/12 fevers to 99 noted, lasix gtt and metazolone 1/13 lasix gtt dced 1/14  intermittent HTN, possibly flash pulmonary edema tied to sedation        Ischemic left hand changes, 1 Unit PRBCs 1/15 1/16 sweep from 4 to 2.5, chest tube removed 1/17 sweep at 6, trial of nebulized morphine for cough, LUE dopplers show no obstruction 1/18 two events of 2nd degree type II HB noted with coughing         sweep at 4, IV lasix 40, acetazolamide, distal XLT trach placement         agitation and BP spikes 1/19 agitation, air hunger, seroquel, klonopin, oxy, valproate, ketamine trial 1/20 sweep at 3.5, diuresis results in chugging, albumin given         1 Unit PRBCs, lidocaine nebulizers for cough, ancef started 1/21 sweep to 8, anxiety, I/Os (+) with lasix and acetazolamide 1/22 sweep at 5, chest CT bilateral upper lobe PEs 1/23 sweep at 5, delirium, I/Os even with lasix and acetazolamide         continued coughing, 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 1/24 sweep at 7.5, 1 Unit PRBCs, repeat bronchoscopy showed resolution of 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 1/25 sweep at 7.5, on propofol, lasix gtt at 45ml/hr, I/Os (+)         nebulized morphine and lidocaine for cough 1/26 sweep at 8, propofol/ precedex, cough present when weaned, lasix gtt at 23ml/hr        ECMO circuit changes, levophed started, 1 Unit PRBCs 1/27 sweep at 4, off sedation, delirium, I/Os even        lasix gtt restated, acetazolamide overnight 1/28 sweep at 7.5, HTN  labile 1/29 sweep of 6, patient reports a tickle in throat, cetacaine 1/30 sweep down to 6, cough improved with cetacaine and gabapentin 1/31 sweep at 5, agitation, off acetazolamide, lasix, weight up 2/1 cough, of pulmicort, cetacaine for cough, continued gabapentin, dexmethorphan       CT demonstrated trach placement against back of trachea 2/2 Trach collar trial somewhat effective in managing cough       low dose diltiazem for HR,  2/3 sweep at 4,trach cannula replacement (longer, more flexible bivona); brochoscopy shows  irritated mucosa       IV lasix and acetazolamide 2/4 sweep at 5, I/Os (+), BUN elevated, lasix gtt with diamox,       trach decannulation, increased gabapentin for cough 2/5 start HCAP coverage for rising WBC/temps, check Pct/cultures, CXR worse with lack of PEEP, diuresed well but started getting very hypotensive, sepsis vs. Too dry 04/18/20 blood cx grew E faecalis 2/7 PICC change, TEE  Past Medical History  none  Interim history/subjective:  Sat EOB 30 min yesterday, stool on tilt table. Weak with IS & flutter valve. Denies complaints today.  Objective   Blood pressure 115/85, pulse (!) 106, temperature 98.7 F (37.1 C), temperature source Axillary, resp. rate 17, height 5\' 4"  (1.626 m), weight 70.3 kg, SpO2 100 %.        Intake/Output Summary (Last 24 hours) at 04/22/2020 0715 Last data filed at 04/22/2020 0659 Gross per 24 hour  Intake 2532.95 ml  Output 4400 ml  Net -1867.05 ml   Filed Weights   04/20/20 0426 04/21/20 0600 04/22/20 0500  Weight: 70.9 kg 69.9 kg 70.3 kg    Examination: Constitutional: critically ill appearing man laying in bed in NAD, more awake today HEENT: [place]/AT, eyes anicteric, NGT in place Neck: trach site bandaged, some air leak with strong cough Cardiovascular: tachycardic, reg rhythm, no murmurs Respiratory: occasional cough- strong, rhales bilaterally Gastrointestinal: soft, NT, ND Skin: left hand bandaged, no rashes. No cyanosis right hand. Neurologic: more awake and interactive, able to speak 1-2 words at a time Psychiatric: calm, not anxious   Net  -1.6L, net +1.6L for admission CXR> persistent cavity left lung base, slightly improved upper lobe aeration bilaterally, no air bronchograms.  Assessment & Plan:  Acute hypoxemic/hypercapnic respiratory failure due to severe ARDS from COVID-19 pneumonia Probable acute PE, and RV dysfunction s/p TPA Refractory coughing- improved after trach removal but still an issue Strep group F/  enterococcal pneumonia- s/p 10 days vanc 1/29 - Continue VV ECMO support, wean sweep as able, but limited with poor respiratory excursion. Sweep trial today to assess circuit CO2 clearance. Being off PPV may be limiting.  - Current cough regimen:  dextromethorphan, gabapentin, plus ongoing opiates and benzos to control WOB. Decreasing benzos. -Con't supplemental O2 via [place]; maintstay of respiratory support is ECMO circuit -IS, flutter, OOB mobility  HTN - Trials of reducing HR/BP have resulted in reduced pCO2 (likely related to fractional flow through ECMO circuit) -Continue coreg. No longer on diltiazem due to hypotension; hopefully can restart in a few days. -Con't clonidine  Acute delirium, with agitation- improved - RASS goal -1 to 0, PRN, morphine PRN, oxycodone 15mg  q6h, qHS seroquel - Decrease kloninpin to 1mg  tid to help improve alertness during the day  Sepsis secondary to E faecalis bacteremia- secondary to PNA, has grown enterococcus in sputum prior, previous vanc course completed 1/29. TEE on 2/7 without vegetations. 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  - wound care following, appreciate dressing recommendations - surgical consult for L hand once more stable -keep A-line out of left arm   Hyperglycemia due to diabetes type II- controlled hyperglycemia -con't levemir 45 units BID, SSI  prn -increasing TF coverage to 10 units Q4h with hold parameters  Acute urinary retention -failed condom cath trial, foley replaced 2/7 - con't bethenacol (off since 1/30); pull foley today -urine culture  Hypernatremia, fluid balance- goal even with balanced  diuresis-- resolved -con't to monitor -con't lasix, stop diamox  Deconditioning -increase mobility today now that sepsis is resolving> increase OOB mobility -con't working with PT, OT, SLP   Daily Goals Checklist  Pain/Anxiety/Delirium protocol (if indicated): see above VAP protocol (if indicated):   DVT prophylaxis: bivalirudin GI prophylaxis: pantoprazole Glucose control: Euglcyemic on basal bolus insulin Mobility/therapy needs: Mobilization as tolerated, appreciate PT's help Code Status: Full code Disposition: ICU   This patient is critically ill with multiple organ system failure which requires frequent high complexity decision making, assessment, support, evaluation, and titration of therapies. This was completed through the application of advanced monitoring technologies and extensive interpretation of multiple databases. During this encounter critical care time was devoted to patient care services described in this note for 43 minutes.   2/30, DO 04/22/20 8:20 AM [facility]  From 7AM- 7PM if no response to pager, please call [phone]. After hours, 7PM- 7AM, please call Elink  [phone].

## 2020-04-22 NOTE — Progress Notes (Signed)
Occupational Therapy Treatment Patient Details Name: Alex [name] MRN: [id number] DOB: [birth year] Today's Date: 04/22/2020    History of present illness Pt is 50 y.o. male  with no significant past medical history admitted on 02/12/2020 with dyspnea, cough, nausea/vomiting ~ 1 week ago with worsening symptoms of body aches and fatigue and +COVID 02/07/20 and admitted with shortness of breath. Required intubation 03/10/20. Cannulated for VV ECMO 02/14/2020. Oxygenating better with ECMO. Also evidence of RLE DVT, RV thrombus, and high suspicion of PE. Has required chest tube to L lung for collapse which needs further reposition with recurrent collapse. Extubated 03-15-19.  Reintubated 1/5 and trach placed 1/6. Decannulated 04/16/20.   OT comments  Splint removed and found not fitting the patient correctly.  New splint may need to be fabricated depending on progression of medical status.  OT continued aggressive ROM to L UE in particular.  Hand was positioned in elevation, and not as much swelling noted this session.  Patient tolerated ROM well, OT is attempting to increase AROM and strength to bilateral upper extremities.  Continue to follow in the acute setting to maximize functional status, and assist with transition to the next level of care when appropriate.    Follow Up Recommendations  CIR;Supervision/Assistance - 24 hour    Equipment Recommendations       Recommendations for Other Services      Precautions / Restrictions Precautions Precaution Comments: ECMO, NGtube, Foley, Pt decannulated himself 2/4 Restrictions Weight Bearing Restrictions: No                                                                                           Cognition  Increased alertness noted at times, and patient asking relevant questions.                                                Exercises General Exercises - Upper Extremity Shoulder  Flexion: AAROM;Both;10 reps;Supine Elbow Flexion: Both;10 reps;AAROM;Supine Elbow Extension: Both;10 reps;AAROM;Supine Digit Composite Flexion: Both;10 reps;Supine;PROM Composite Extension: Both;10 reps;Supine;PROM   Shoulder Instructions       General Comments      Pertinent Vitals/ Pain       Pain Assessment: Faces Faces Pain Scale: No hurt Pain Intervention(s): Monitored during session                                                          Frequency  Min 2X/week        Progress Toward Goals  OT Goals(current goals can now be found in the care plan section)  Progress towards OT goals: Progressing toward goals  Acute Rehab OT Goals Patient Stated Goal: unable to state OT Goal Formulation: Patient unable to participate in goal setting Time For Goal Achievement: 04/30/20 Potential to Achieve Goals: Fair  Plan Discharge plan remains appropriate  Co-evaluation                 AM-PAC OT "6 Clicks" Daily Activity     Outcome Measure   Help from another person eating meals?: Total Help from another person taking care of personal grooming?: Total Help from another person toileting, which includes using toliet, bedpan, or urinal?: Total Help from another person bathing (including washing, rinsing, drying)?: Total Help from another person to put on and taking off regular upper body clothing?: Total Help from another person to put on and taking off regular lower body clothing?: Total 6 Click Score: 6    End of Session Equipment Utilized During Treatment: Oxygen  OT Visit Diagnosis: Muscle weakness (generalized) (M62.81);Other symptoms and signs involving cognitive function;Other abnormalities of gait and mobility (R26.89)   Activity Tolerance Patient limited by fatigue   Patient Left in bed;with call bell/phone within reach   Nurse Communication          Time: 1347-1400 OT Time Calculation (min): 13 min  Charges: OT  General Charges $OT Visit: 1 Visit OT Treatments $Therapeutic Exercise: 8-22 mins  04/22/2020  Rich, OTR/L  Acute Rehabilitation Services  Office:  [phone]    Suzanna Obey 04/22/2020, 2:07 PM

## 2020-04-22 NOTE — Progress Notes (Signed)
Patient ID: Alex [name], male   DOB: 1970/04/19, 50 y.o.   MRN: [id number]     Advanced Heart Failure Rounding Note  PCP-Cardiologist: No primary care provider on file.   Subjective:    - 12/30: VV ECMO cannulation - 12/31: Left chest tube replaced - 1/2: Extubated. Echo with EF 60-65%, mildly dilated RV with mildly decreased systolic function.  - 1/4: Agitated, suspected aspiration.  Re-intubated.  - 1/5: ECMO cannula repositioned under TEE guidance. TEE showed moderately dilated/moderate-severely dysfunctional RV in setting of hypoxemia. LUE DVT found.  Patient got 1/2 dose of TPA due to initial concern for large PE.  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  - 1/6: Tracheostomy - 1/7: Echo with mild RV dilation/mild RV dysfunction.  - 1/16: Left chest tube out - 1/17: LUE arterial dopplers repeated, showed no obstruction.  - 1/20: Echo with EF 65-70%, mildly D-shaped septum, mildly dilated and mildly dysfunctional RV.  - 1/22: CTA chest: Bilateral upper lobe PEs (suspect chronic), changes c/w ARDS - 1/23: Bronchoscopy showed semi-occlusive ?mass/polyp in the trachea.  - 1/24: Bronchoscopy showed resolution of 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 - 1/26: ECMO circuit changed.  - 2/1: CT chest showed diffuse bronchiectasis as well as diffuse opacity consistent with COVID-19 PNA with ARDS. - 2/3: Trach exchange - 2/5: Extubate to HFNC - 2/6: Enterococcal bacteremia.  Echo showed EF 65-70%, normal-appearing RV, no vegetation noted.  - 2/7: TEE with no vegetation, EF 55-60%, RV low normal function with normal size.   Sweep 9 today, remains on HFNC 10L.  Awake on vent.  CXR with severe bilateral airspace disease, slight improvement.   WBCs 13.9, afebrile. He is on vancomycin for Enterococcus faecalis in blood cultures.   Hematuria ongoing.   I/Os negative with 2 dose 40 mg IV Lasix + Diamox.    Has been working with PT and speech, able to speak some. Less cough.   Had short  episodes with decreased HR yesterday, one associated with cough.   Of note, pre and post gases show expected increase in O2 but minimal change in CO2.   ECMO parameters: 3600 rpm Flow 4.96 L/min Pvenous -98 Delta P 32 Sweep 9 HFNC 10 L  ABG 7.36/46/75/94% LDH  534 => 488 => 547 => 461 => 474 => 489 => 553 => 449 => 457 => 421 => 464 => 469 => 432 => 497 => 507 PTT 61 Lactate 1.3  Objective:   Weight Range: 70.3 kg Body mass index is 26.6 kg/m.   Vital Signs:   Temp:  [98.3 F (36.8 C)-98.7 F (37.1 C)] 98.7 F (37.1 C) (02/10 0400) Pulse Rate:  [97-110] 106 (02/10 0500) Resp:  [0-38] 17 (02/10 0500) BP: (105-162)/(65-95) 115/85 (02/10 0500) SpO2:  [92 %-100 %] 100 % (02/10 0500) Arterial Line BP: (118-175)/(58-90) 159/68 (02/10 0500) Weight:  [70.3 kg] 70.3 kg (02/10 0500) Last BM Date: 04/21/20  Weight change: Filed Weights   04/20/20 0426 04/21/20 0600 04/22/20 0500  Weight: 70.9 kg 69.9 kg 70.3 kg    Intake/Output:   Intake/Output Summary (Last 24 hours) at 04/22/2020 0742 Last data filed at 04/22/2020 0659 Gross per 24 hour  Intake 2532.95 ml  Output 4400 ml  Net -1867.05 ml      Physical Exam    General: NAD Neck: Thick. No JVD, no thyromegaly or thyroid nodule.  Lungs: Decreased bilaterally.  CV: Nondisplaced PMI.  Heart regular S1/S2, no S3/S4, no murmur.  No peripheral edema.  Abdomen: Soft, nontender, no hepatosplenomegaly, no distention.  Skin: Intact without lesions or rashes.  Neurologic: Alert and follows commands.  Extremities: Dry gangrene digits left hand.   HEENT: Normal.    Telemetry   NSR 100s Personally reviewed   Labs    CBC Recent Labs    04/21/20 1603 04/21/20 1620 04/22/20 0418 04/22/20 0440  WBC 13.7*  --   --  13.9*  HGB 8.6*   < > 13.3 8.7*  HCT 27.0*   < > 39.0 28.8*  MCV 95.4  --   --  95.7  PLT 204  --   --  216   < > = values in this interval not displayed.   Basic Metabolic Panel Recent Labs     04/21/20 1603 04/21/20 1620 04/22/20 0418 04/22/20 0440  NA 146*   < > 147* 143  K 3.7   < > 3.5 3.6  CL 108  --   --  106  CO2 28  --   --  28  GLUCOSE 154*  --   --  194*  BUN 67*  --   --  56*  CREATININE 0.64  --   --  0.65  CALCIUM 8.9  --   --  8.8*   < > = values in this interval not displayed.   Liver Function Tests No results for input(s): AST, ALT, ALKPHOS, BILITOT, PROT, ALBUMIN in the last 72 hours. No results for input(s): LIPASE, AMYLASE in the last 72 hours. Cardiac Enzymes No results for input(s): CKTOTAL, CKMB, CKMBINDEX, TROPONINI in the last 72 hours.  BNP: BNP (last 3 results) No results for input(s): BNP in the last 8760 hours.  ProBNP (last 3 results) No results for input(s): PROBNP in the last 8760 hours.   D-Dimer No results for input(s): DDIMER in the last 72 hours. Hemoglobin A1C No results for input(s): HGBA1C in the last 72 hours. Fasting Lipid Panel No results for input(s): CHOL, HDL, LDLCALC, TRIG, CHOLHDL, LDLDIRECT in the last 72 hours. Thyroid Function Tests No results for input(s): TSH, T4TOTAL, T3FREE, THYROIDAB in the last 72 hours.  Invalid input(s): FREET3  Other results:   Imaging    No results found.   Medications:     Scheduled Medications: . sodium chloride   Intravenous Once  . acetaZOLAMIDE  250 mg Per Tube Daily  . bethanechol  10 mg Per Tube TID  . carvedilol  25 mg Oral BID WC  . chlorhexidine  15 mL Mouth Rinse BID  . Chlorhexidine Gluconate Cloth  6 each Topical Daily  . chlorpheniramine-HYDROcodone  5 mL Per Tube Q12H  . clonazePAM  1 mg Oral TID  . cloNIDine  0.1 mg Per Tube Q8H  . dextromethorphan  30 mg Per Tube TID  . docusate  100 mg Per Tube BID  . fiber  1 packet Per Tube BID  . free water  200 mL Per Tube Q4H  . furosemide  40 mg Intravenous BID  . gabapentin  900 mg Per Tube Q8H  . insulin aspart  0-20 Units Subcutaneous Q4H  . insulin aspart  10 Units Subcutaneous Q4H  . insulin detemir   45 Units Subcutaneous BID  . ipratropium-albuterol  3 mL Nebulization QID  . lactobacillus acidophilus  2 tablet Per Tube TID  . liver oil-zinc oxide   Topical 5 X Daily  . mouth rinse  15 mL Mouth Rinse q12n4p  . melatonin  3 mg Per Tube QHS  .  metoCLOPramide (REGLAN) injection  5 mg Intravenous Q8H  . nutrition supplement (JUVEN)  1 packet Per Tube BID BM  . oxyCODONE  15 mg Per Tube Q6H  . pantoprazole sodium  40 mg Per Tube QHS  . QUEtiapine  100-300 mg Per Tube QHS  . sennosides  10 mL Per Tube QHS  . sodium chloride flush  10-40 mL Intracatheter Q12H  . sodium chloride flush  10-40 mL Intracatheter Q12H    Infusions: . sodium chloride    . sodium chloride Stopped (04/16/20 0934)  . sodium chloride 250 mL (04/07/20 2203)  . sodium chloride Stopped (03/12/20 0131)  . sodium chloride Stopped (04/19/20 1959)  . albumin human 12.5 g (04/11/20 1558)  . bivalirudin (ANGIOMAX) infusion 0.5 mg/mL (Non-ACS indications) 0.083 mg/kg/hr (04/22/20 0600)  . feeding supplement (PIVOT 1.5 CAL) 1,000 mL (04/21/20 0604)  . norepinephrine (LEVOPHED) Adult infusion Stopped (04/19/20 0146)  . vancomycin Stopped (04/21/20 2314)    PRN Medications: Place/Maintain arterial line **AND** sodium chloride, sodium chloride, sodium chloride, acetaminophen (TYLENOL) oral liquid 160 mg/5 mL, albumin human, [DISCONTINUED] lidocaine **AND** albuterol, Gerhardt's butt cream, guaiFENesin, hydrALAZINE, labetalol, lip balm, midazolam, morphine, morphine CONCENTRATE, ondansetron (ZOFRAN) IV, phenol, polyethylene glycol, promethazine-codeine, simethicone, sodium chloride flush, sodium chloride flush   Assessment/Plan   1. Acute hypoxemic respiratory failure: Due to COVID-19 PNA with bilateral infiltrates.  Refractory hypoxemia, VV-ECMO cannulation on 04/07/2020 with improvement in oxygenation.  Developed left PTX post-subclavian CVL and had left chest tube, the left lung is re-expanded and CT out.  He was extubated  1/2 but reintubated 1/4 with agitation and suspected aspiration.  Tracheostomy 1/6.  CTA chest 1/22 with suspected chronic PEs and ARDS. ECMO cannula repositioned 1/5. ECMO circuit changed 1/26. Extubated to HFNC on 2/5. LDH stable. Sweep at 9, have struggled with hypercarbia from significant dead space ventilation and had a set back with recurrent sepsis.  Restarted abx 2/5 with sepsis, Enterococcus faecalis, now on vancomycin.  I/Os negative with IV Lasix + acetazolamide. CXR worsened with extubation and loss of PEEP, mildly improved today. - Drop sweep and recheck pre/post ABG to see if there is a change in PaCO2.  If not, may need to change circuit.  - Lasix 40 mg IV bid again today with acetazolamide 250 once daily.   - On vancomycin with Enterococcus.    - Patient has had remdesivir, tocilizumab. - Completed steroid taper. - Continue bivalirudin, goal PTT 65-80.  Discussed dosing with PharmD personally.  - Continue to mobilize => work with PT, stand in bed.   - Coreg continues at 25 mg bid to increase fractional flow via ECMO circuit.  - Will need to consider for lung transplantation, think we need to start thinking along this line once Enterococcal infection treated and he is doing more PT.  2. 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  Clot noted on TEE in RV as well.  TTE 1/2 showed normal EF 60-65%, RV improved (mildly dilated/dysfunctional). TEE on 1/5 with moderate to severe RV dysfunction but patient was hypoxemic.  Had 1/2 dose TPA on 1/5. Echo 1/20 with mildly dilated/mildly dysfunctional RV. CTA chest 1/22 with chronic-appearing PEs in upper lobes. - Bivalirudin for goal PTT 65-80.  Discussed dosing with PharmD personally. 3. Left PTX: Left chest tube, lung is re-expanded. Tube now out, stable CXR. 4. Shock: Suspect septic/distributive.  Now resolved, off NE.  5. Anemia: Hgb 8.7, transfuse < 8.    6.  AKI: Stable renal  indices, follow closely.   7. Hyperglycemia: insulin.  8. HTN: BP stable.     - On clonidine.  - Continue Coreg 25 mg bid.   - Suspect arterial line inaccurate, following cuff for now.  9. CHB: Episode of CHB when hypoxemic and with cough (suspect vagal).  NSR since then.   He has occasional short vagal episodes.  - Continue Coreg, watch rhythm.  10. Thrombocytopenia: Resolved  11. Ileus: Resolved. TFs restarted.  - Getting Reglan  - Cor-trak repositioned to post-pyloric placement 12. Ischemic digits: LUE.  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  Repeat study 1/17 showed no obstruction.  - Wound Care following. 13. ID: Group F Strep and Enterococcus faecalis in sputum. Initially completed abx for these bacteria. Now with recurrent sepsis, Enterococcus faecalis in blood now. Vancomycin restarted. TEE 2/7 with no vegetation.  - Continue vancomycin.   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   15. Hypernatremia: Continue free water.  16. Hematuria: Will get urine culture and d/c foley.   CRITICAL CARE Performed by: Marca Ancona  Total critical care time: 40 minutes  Critical care time was exclusive of separately billable procedures and treating other patients.  Critical care was necessary to treat or prevent imminent or life-threatening deterioration.  Critical care was time spent personally by me on the following activities: development of treatment plan with patient and/or surrogate as well as nursing, discussions with consultants, evaluation of patient's response to treatment, examination of patient, obtaining history from patient or surrogate, ordering and performing treatments and interventions, ordering and review of laboratory studies, ordering and review of radiographic studies, pulse oximetry and re-evaluation of patient's condition.    Length of Stay:  43  Marca Ancona, MD  04/22/2020, 7:42 AM  Advanced Heart Failure Team Pager [phone] (M-F; 7a - 4p)  Please contact [facility] for night-coverage after hours (4p -7a ) and weekends on [email or web address]

## 2020-04-22 NOTE — Progress Notes (Addendum)
ANTICOAGULATION CONSULT NOTE  Pharmacy Consult for bivalirudin Indication: ECMO and VTE  Labs: Recent Labs    04/20/20 0357 04/20/20 0404 04/21/20 0402 04/21/20 0406 04/21/20 1603 04/21/20 1620 04/22/20 0440 04/22/20 0930 04/22/20 1112 04/22/20 1626 04/22/20 1700  HGB 8.3*   < > 8.9*   < > 8.6*   < > 8.7*   < > 9.2* 9.5* 9.0*  HCT 28.1*   < > 28.4*   < > 27.0*   < > 28.8*   < > 27.0* 28.0* 30.4*  PLT 197   < > 216  --  204  --  216  --   --   --  217  APTT 66*   < > 59*  --  63*  --  61*  --   --   --  61*  LABPROT 18.1*  --  17.8*  --   --   --  17.5*  --   --   --   --   INR 1.6*  --  1.5*  --   --   --  1.5*  --   --   --   --   CREATININE 0.79   < > 0.73  --  0.64  --  0.65  --   --   --  0.63   < > = values in this interval not displayed.    Assessment: 32 yoM admitted with COVID-19 PNA with worsening hypoxia, s/p cannulation for ECMO. Pt was started on IV heparin prior to cannulation due to acute DVTs and possible PE, transitioned to bivalirudin with ECMO. Now s/p tPA on 1/5 and tracheostomy on 1/6. Circuit last changed 1/26.  APTT therapeutic at 61, on bivalirudin 0.083 mg/kg/hr. Hgb 9.0, plt 217. Having bleeding in urine - foley removed today and RN reports continues to note blood in urine with clot earlier. No other bleeding noted. Circuit remains unchanged.   Goal of Therapy:  aPTT 60-80 seconds   Plan:  -Continue bivalirudin at 0.083 mg/kg/hr (using order-specific wt 72.1kg) -Monitor q12h aPTT/CBC, LDH, and for s/sx of bleeding  Gerrit Halls, PharmD Clinical Pharmacist   04/22/2020 6:17 PM

## 2020-04-22 NOTE — Progress Notes (Signed)
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  2/07 repeat bcx ngtd  2/07 right UE picc removed; placement left UE picc. ECMO catheter remains along with right radial a-line 2/07 TEE no valvular vegetation; ecmo catheter without thrombus/vegetation  ------ 2/10 assessment Dependent on ECMO still for covid ards Clinically stable sick  Enterococcus BSI s/p picc exchange 2/07. Tee no valve/ecmo catheter vegetation. If repeat bcx positive will need to revisit catheter exchange discussion including a-line and ecmo. Repeat bcx 2/07 negative.   PLAN: 1. Finish 2 weeks vanc from 2/07 until 2/21 2. Resend blood cx if recurrent sepsis 3.   ID will sign off  Active Problems:   COVID-19   Pneumonia due to COVID-19 virus   Acute respiratory failure with hypoxia (HCC)   Respiratory failure (HCC)   Pressure injury of skin   History of extracorporeal membrane oxygenation   Bacteremia due to Enterococcus   ARDS (adult respiratory distress syndrome)  (HCC)   Scheduled Meds: . sodium chloride   Intravenous Once  . acetaZOLAMIDE  250 mg Per Tube Daily  . bethanechol  10 mg Per Tube TID  . carvedilol  25 mg Oral BID WC  . chlorhexidine  15 mL Mouth Rinse BID  . Chlorhexidine Gluconate Cloth  6 each Topical Daily  . chlorpheniramine-HYDROcodone  5 mL Per Tube Q12H  . clonazePAM  1 mg Oral TID  . cloNIDine  0.1 mg Per Tube Q8H  . dextromethorphan  30 mg Per Tube TID  . docusate  100 mg Per Tube BID  . fiber  1 packet Per Tube BID  . free water  200 mL Per Tube Q4H  . furosemide  40 mg Intravenous BID  . gabapentin  900 mg Per Tube Q8H  . insulin aspart  0-20 Units Subcutaneous Q4H  . insulin aspart  10 Units Subcutaneous Q4H  . insulin detemir  45 Units Subcutaneous BID  . ipratropium-albuterol  3 mL Nebulization QID  . lactobacillus acidophilus  2 tablet Per Tube TID  . liver oil-zinc oxide   Topical 5 X Daily  . mouth rinse  15 mL Mouth Rinse q12n4p  . melatonin  3 mg Per Tube QHS  . metoCLOPramide (REGLAN) injection  5 mg Intravenous Q8H  . nutrition supplement (JUVEN)  1 packet Per Tube BID BM  . oxyCODONE  15 mg Per Tube Q6H  . pantoprazole sodium  40 mg Per Tube QHS  . QUEtiapine  100-300 mg Per Tube QHS  . sennosides  10 mL Per Tube QHS  . sodium chloride flush  10-40 mL Intracatheter Q12H  . sodium chloride flush  10-40 mL Intracatheter Q12H   Continuous Infusions: . sodium chloride    . sodium chloride Stopped (04/16/20 0934)  . sodium chloride 250 mL (04/07/20 2203)  . sodium chloride Stopped (03/12/20 0131)  . sodium chloride Stopped (04/19/20 1959)  . albumin human 12.5 g (04/11/20 1558)  . bivalirudin (ANGIOMAX) infusion 0.5 mg/mL (Non-ACS indications) 0.083 mg/kg/hr (04/22/20 0800)  . feeding supplement (PIVOT 1.5 CAL) 1,000 mL (04/21/20 0604)  . norepinephrine (LEVOPHED) Adult infusion Stopped (04/19/20 0146)  . vancomycin 750 mg (04/22/20 0806)   PRN Meds:.Place/Maintain arterial line **AND** sodium  chloride, sodium chloride, sodium chloride, acetaminophen (TYLENOL) oral liquid 160 mg/5 mL, albumin human, [DISCONTINUED] lidocaine **AND** albuterol, Gerhardt's butt cream, guaiFENesin, hydrALAZINE, labetalol, lip balm, midazolam, morphine, morphine CONCENTRATE, ondansetron (ZOFRAN) IV, phenol, polyethylene glycol, promethazine-codeine, simethicone, sodium chloride flush, sodium chloride flush   SUBJECTIVE: No acute event Denies nv/diarrhea No rash  Review of Systems: ROS Other ros negative  No Known Allergies  OBJECTIVE: Vitals:   04/22/20 0500 04/22/20 0600 04/22/20 0700 04/22/20 0800  BP: 115/85 (!) 144/99 116/71 (!) 133/95  Pulse: (!) 106 (!) 111 (!) 108 (!) 113  Resp: 17 (!) 31 10 (!) 27  Temp:      TempSrc:      SpO2: 100% 97% 100% 100%  Weight: 70.3 kg     Height:       Body mass index is 26.6 kg/m.  Physical Exam Same settings from 2/08 --> ecmo veno-veno circuit; o2 set at 10 liters  Right ij ecmo catheter site no purulence/redness LUE picc site no purulence/redness Right radial a-line site no purulence/redness  Acutely chronically ill appearing, eyes open; tracking and more cooperative today Heent: normocephalic; conj clear; oropharynx clear cv rrr no mrg Lungs coarse bs/rhonchi bialteral abd soft/nondistended/nontender Ext no edema Skin left hand dry gangrene on fingers Neuro - more tracking and following simple commands today 2/9  Lab Results Lab Results  Component Value Date   WBC 13.9 (H) 04/22/2020   HGB 8.7 (L) 04/22/2020   HCT 28.8 (L) 04/22/2020   MCV 95.7 04/22/2020   PLT 216 04/22/2020    Lab Results  Component Value Date   CREATININE 0.65 04/22/2020   BUN 56 (H) 04/22/2020   NA 143 04/22/2020   K 3.6 04/22/2020   CL 106 04/22/2020   CO2 28 04/22/2020    Lab Results  Component Value Date   ALT 32 04/11/2020   AST 19 04/11/2020   ALKPHOS 143 (H) 04/11/2020   BILITOT 0.8 04/11/2020     Microbiology: Recent Results (from the  past 240 hour(s))  Culture, respiratory (non-expectorated)     Status: None   Collection Time: 04/13/20  5:23 PM   Specimen: Tracheal Aspirate; Respiratory  Result Value Ref Range Status   Specimen Description TRACHEAL ASPIRATE  Final   Special Requests NONE  Final   Gram Stain   Final    MODERATE WBC PRESENT, PREDOMINANTLY PMN FEW GRAM POSITIVE RODS RARE GRAM POSITIVE COCCI    Culture   Final    FEW Normal respiratory flora-no Staph aureus or Pseudomonas seen Performed at [facility], [address], [place], [place] [address]    Report Status 04/16/2020 FINAL  Final  Culture, blood (routine x 2)     Status: Abnormal   Collection Time: 04/17/20  8:01 AM   Specimen: BLOOD LEFT HAND  Result Value Ref Range Status   Specimen Description BLOOD LEFT HAND  Final   Special Requests  Final    BOTTLES DRAWN AEROBIC ONLY Blood Culture adequate volume   Culture  Setup Time   Final    GRAM POSITIVE COCCI IN CHAINS IN PAIRS AEROBIC BOTTLE ONLY CRITICAL VALUE NOTED.  VALUE IS CONSISTENT WITH PREVIOUSLY REPORTED AND CALLED VALUE.    Culture (A)  Final    ENTEROCOCCUS FAECALIS SUSCEPTIBILITIES PERFORMED ON PREVIOUS CULTURE WITHIN THE LAST 5 DAYS. Performed at [facility], [address], [place], [place] [address]    Report Status 04/19/2020 FINAL  Final  Culture, blood (routine x 2)     Status: Abnormal   Collection Time: 04/17/20  8:11 AM   Specimen: BLOOD LEFT WRIST  Result Value Ref Range Status   Specimen Description BLOOD LEFT WRIST  Final   Special Requests   Final    BOTTLES DRAWN AEROBIC AND ANAEROBIC Blood Culture adequate volume   Culture  Setup Time   Final    GRAM POSITIVE COCCI IN CHAINS IN BOTH AEROBIC AND ANAEROBIC BOTTLES CRITICAL RESULT CALLED TO, READ BACK BY AND VERIFIED WITH: Sheppard Coil PHARMD @2016  04/17/20 EB Performed at [facility], [address], [place], [place]    Culture ENTEROCOCCUS FAECALIS (A)  Final   Report Status  04/19/2020 FINAL  Final   Organism ID, Bacteria ENTEROCOCCUS FAECALIS  Final      Susceptibility   Enterococcus faecalis - MIC*    AMPICILLIN <=2 SENSITIVE Sensitive     VANCOMYCIN 1 SENSITIVE Sensitive     GENTAMICIN SYNERGY SENSITIVE Sensitive     * ENTEROCOCCUS FAECALIS  Blood Culture ID Panel (Reflexed)     Status: Abnormal   Collection Time: 04/17/20  8:11 AM  Result Value Ref Range Status   Enterococcus faecalis DETECTED (A) NOT DETECTED Final    Comment: CRITICAL RESULT CALLED TO, READ BACK BY AND VERIFIED WITH: 06/15/20 PHARMD @2016  04/17/20 EB    Enterococcus Faecium NOT DETECTED NOT DETECTED Final   Listeria monocytogenes NOT DETECTED NOT DETECTED Final   Staphylococcus species NOT DETECTED NOT DETECTED Final   Staphylococcus aureus (BCID) NOT DETECTED NOT DETECTED Final   Staphylococcus epidermidis NOT DETECTED NOT DETECTED Final   Staphylococcus lugdunensis NOT DETECTED NOT DETECTED Final   Streptococcus species NOT DETECTED NOT DETECTED Final   Streptococcus agalactiae NOT DETECTED NOT DETECTED Final   Streptococcus pneumoniae NOT DETECTED NOT DETECTED Final   Streptococcus pyogenes NOT DETECTED NOT DETECTED Final   A.calcoaceticus-baumannii NOT DETECTED NOT DETECTED Final   Bacteroides fragilis NOT DETECTED NOT DETECTED Final   Enterobacterales NOT DETECTED NOT DETECTED Final   Enterobacter cloacae complex NOT DETECTED NOT DETECTED Final   Escherichia coli NOT DETECTED NOT DETECTED Final   Klebsiella aerogenes NOT DETECTED NOT DETECTED Final   Klebsiella oxytoca NOT DETECTED NOT DETECTED Final   Klebsiella pneumoniae NOT DETECTED NOT DETECTED Final   Proteus species NOT DETECTED NOT DETECTED Final   Salmonella species NOT DETECTED NOT DETECTED Final   Serratia marcescens NOT DETECTED NOT DETECTED Final   Haemophilus influenzae NOT DETECTED NOT DETECTED Final   Neisseria meningitidis NOT DETECTED NOT DETECTED Final   Pseudomonas aeruginosa NOT DETECTED NOT  DETECTED Final   Stenotrophomonas maltophilia NOT DETECTED NOT DETECTED Final   Candida albicans NOT DETECTED NOT DETECTED Final   Candida auris NOT DETECTED NOT DETECTED Final   Candida glabrata NOT DETECTED NOT DETECTED Final   Candida krusei NOT DETECTED NOT DETECTED Final   Candida parapsilosis NOT DETECTED NOT DETECTED Final  Candida tropicalis NOT DETECTED NOT DETECTED Final   Cryptococcus neoformans/gattii NOT DETECTED NOT DETECTED Final   Vancomycin resistance NOT DETECTED NOT DETECTED Final    Comment: Performed at [facility], [address] [place], [place] [address]  Culture, blood (routine x 2)     Status: None (Preliminary result)   Collection Time: 04/19/20 10:40 AM   Specimen: BLOOD LEFT FOREARM  Result Value Ref Range Status   Specimen Description BLOOD LEFT FOREARM  Final   Special Requests   Final    BOTTLES DRAWN AEROBIC AND ANAEROBIC Blood Culture adequate volume   Culture   Final    NO GROWTH 3 DAYS Performed at [facility], [address] [place], [place] [address]    Report Status PENDING  Incomplete  Culture, blood (routine x 2)     Status: None (Preliminary result)   Collection Time: 04/19/20 12:20 PM   Specimen: BLOOD  Result Value Ref Range Status   Specimen Description BLOOD SITE NOT SPECIFIED  Final   Special Requests   Final    BOTTLES DRAWN AEROBIC AND ANAEROBIC Blood Culture results may not be optimal due to an excessive volume of blood received in culture bottles   Culture   Final    NO GROWTH 3 DAYS Performed at [facility], [address] [place], [place] [address]    Report Status PENDING  Incomplete    Serology: Hiv/hepatitis serology negative 12/27 covid pcr positive  Micro: 2/5 bcx e faecalis 2/7 bcx ngtd  Imaging: 2/01 ct chest reviewed 1. ECMO device is seen involving the right internal jugular vein and passing through the SVC, right atrium and into the IVC. Right-sided PICC line is seen with distal  tip in expected position of cavoatrial junction. 2. Small right pleural effusion is noted. 3. Bronchiectasis is noted throughout both lung bases. Some degree of cystic bronchiectasis is noted in the left lower lobe. There remains diffuse opacity of both lungs consistent with pneumonia or inflammation. These findings are consistent with COVID-19 pneumonia as well as superimposed ARDS. 4. Stable 12 mm right paratracheal lymph node is noted which most likely is reactive in etiology.  2/07 tee 1. Left ventricular ejection fraction, by estimation, is 55 to 60%. The  left ventricle has normal function. The left ventricle has no regional  wall motion abnormalities.  2. Right ventricular systolic function is low normal. The right  ventricular size is normal.  3. No left atrial/left atrial appendage thrombus was detected.  4. ECMO cannula noted in the right atrium with flow directed towards the  tricuspid valve. There is a PICC line noted ending in the the RA just  beyond the SVC inlet, it is adjacent to the ECMO cannula. No vegetation  noted on PICC or ECMO cannula.  5. No aortic valve vegetation. The aortic valve is tricuspid. Aortic  valve regurgitation is not visualized. No aortic stenosis is present.  6. No pulmonic valve vegetation.  7. No mitral valve vegetation. The mitral valve is normal in structure.  Trivial mitral valve regurgitation. No evidence of mitral stenosis.  8. No tricuspid valve vegetation.  9. Small PFO noted by color doppler.   2/08 cxr Reviewed severe bilateral diffuse airspace opacity 1. Similar appearance of diffuse bilateral airspace disease although air bronchograms are now more evident. 2. New left PICC line tip overlies the proximal SVC level. 3. Interval removal right PICC line  2/09 cxr Reviewed Not any better. Almost complete  white out bilaterally with air bronchogram   Raymondo Band, MD [facility] [phone] pager    04/22/2020, 8:44 AM

## 2020-04-23 ENCOUNTER — Inpatient Hospital Stay (HOSPITAL_COMMUNITY): Payer: Medicaid Other

## 2020-04-23 DIAGNOSIS — R739 Hyperglycemia, unspecified: Secondary | ICD-10-CM

## 2020-04-23 DIAGNOSIS — Z9911 Dependence on respirator [ventilator] status: Secondary | ICD-10-CM

## 2020-04-23 LAB — GLUCOSE, CAPILLARY
Glucose-Capillary: 152 mg/dL — ABNORMAL HIGH (ref 70–99)
Glucose-Capillary: 126 mg/dL — ABNORMAL HIGH (ref 70–99)
Glucose-Capillary: 122 mg/dL — ABNORMAL HIGH (ref 70–99)
Glucose-Capillary: 90 mg/dL (ref 70–99)
Glucose-Capillary: 130 mg/dL — ABNORMAL HIGH (ref 70–99)
Glucose-Capillary: 111 mg/dL — ABNORMAL HIGH (ref 70–99)

## 2020-04-23 LAB — POCT I-STAT 7, (LYTES, BLD GAS, ICA,H+H)
Acid-Base Excess: 2 mmol/L (ref 0.0–2.0)
Acid-Base Excess: 5 mmol/L — ABNORMAL HIGH (ref 0.0–2.0)
Acid-Base Excess: 2 mmol/L (ref 0.0–2.0)
Acid-Base Excess: 4 mmol/L — ABNORMAL HIGH (ref 0.0–2.0)
Acid-Base Excess: 4 mmol/L — ABNORMAL HIGH (ref 0.0–2.0)
Bicarbonate: 27.4 mmol/L (ref 20.0–28.0)
Bicarbonate: 30.2 mmol/L — ABNORMAL HIGH (ref 20.0–28.0)
Bicarbonate: 29.1 mmol/L — ABNORMAL HIGH (ref 20.0–28.0)
Bicarbonate: 30.7 mmol/L — ABNORMAL HIGH (ref 20.0–28.0)
Bicarbonate: 30.6 mmol/L — ABNORMAL HIGH (ref 20.0–28.0)
Calcium, Ion: 1.2 mmol/L (ref 1.15–1.40)
Calcium, Ion: 1.29 mmol/L (ref 1.15–1.40)
Calcium, Ion: 1.3 mmol/L (ref 1.15–1.40)
Calcium, Ion: 1.33 mmol/L (ref 1.15–1.40)
Calcium, Ion: 1.28 mmol/L (ref 1.15–1.40)
HCT: 20 % — ABNORMAL LOW (ref 39.0–52.0)
HCT: 24 % — ABNORMAL LOW (ref 39.0–52.0)
HCT: 28 % — ABNORMAL LOW (ref 39.0–52.0)
HCT: 29 % — ABNORMAL LOW (ref 39.0–52.0)
HCT: 28 % — ABNORMAL LOW (ref 39.0–52.0)
Hemoglobin: 6.8 g/dL — CL (ref 13.0–17.0)
Hemoglobin: 8.2 g/dL — ABNORMAL LOW (ref 13.0–17.0)
Hemoglobin: 9.5 g/dL — ABNORMAL LOW (ref 13.0–17.0)
Hemoglobin: 9.9 g/dL — ABNORMAL LOW (ref 13.0–17.0)
Hemoglobin: 9.5 g/dL — ABNORMAL LOW (ref 13.0–17.0)
O2 Saturation: 64 %
O2 Saturation: 96 %
O2 Saturation: 95 %
O2 Saturation: 94 %
O2 Saturation: 92 %
Patient temperature: 37.1
Patient temperature: 37.1
Patient temperature: 37.3
Patient temperature: 37.2
Potassium: 3.2 mmol/L — ABNORMAL LOW (ref 3.5–5.1)
Potassium: 3.6 mmol/L (ref 3.5–5.1)
Potassium: 4.2 mmol/L (ref 3.5–5.1)
Potassium: 4.1 mmol/L (ref 3.5–5.1)
Potassium: 4 mmol/L (ref 3.5–5.1)
Sodium: 143 mmol/L (ref 135–145)
Sodium: 147 mmol/L — ABNORMAL HIGH (ref 135–145)
Sodium: 145 mmol/L (ref 135–145)
Sodium: 144 mmol/L (ref 135–145)
Sodium: 144 mmol/L (ref 135–145)
TCO2: 29 mmol/L (ref 22–32)
TCO2: 32 mmol/L (ref 22–32)
TCO2: 31 mmol/L (ref 22–32)
TCO2: 32 mmol/L (ref 22–32)
TCO2: 32 mmol/L (ref 22–32)
pCO2 arterial: 45.8 mmHg (ref 32.0–48.0)
pCO2 arterial: 48 mmHg (ref 32.0–48.0)
pCO2 arterial: 56.6 mmHg — ABNORMAL HIGH (ref 32.0–48.0)
pCO2 arterial: 58 mmHg — ABNORMAL HIGH (ref 32.0–48.0)
pCO2 arterial: 56.8 mmHg — ABNORMAL HIGH (ref 32.0–48.0)
pH, Arterial: 7.385 (ref 7.350–7.450)
pH, Arterial: 7.407 (ref 7.350–7.450)
pH, Arterial: 7.32 — ABNORMAL LOW (ref 7.350–7.450)
pH, Arterial: 7.331 — ABNORMAL LOW (ref 7.350–7.450)
pH, Arterial: 7.34 — ABNORMAL LOW (ref 7.350–7.450)
pO2, Arterial: 34 mmHg — CL (ref 83.0–108.0)
pO2, Arterial: 80 mmHg — ABNORMAL LOW (ref 83.0–108.0)
pO2, Arterial: 82 mmHg — ABNORMAL LOW (ref 83.0–108.0)
pO2, Arterial: 78 mmHg — ABNORMAL LOW (ref 83.0–108.0)
pO2, Arterial: 69 mmHg — ABNORMAL LOW (ref 83.0–108.0)

## 2020-04-23 LAB — CBC
HCT: 26 % — ABNORMAL LOW (ref 39.0–52.0)
HCT: 31.4 % — ABNORMAL LOW (ref 39.0–52.0)
Hemoglobin: 7.8 g/dL — ABNORMAL LOW (ref 13.0–17.0)
Hemoglobin: 9.6 g/dL — ABNORMAL LOW (ref 13.0–17.0)
MCH: 29 pg (ref 26.0–34.0)
MCH: 29.4 pg (ref 26.0–34.0)
MCHC: 30 g/dL (ref 30.0–36.0)
MCHC: 30.6 g/dL (ref 30.0–36.0)
MCV: 96.7 fL (ref 80.0–100.0)
MCV: 96.3 fL (ref 80.0–100.0)
Platelets: 184 10*3/uL (ref 150–400)
Platelets: 193 10*3/uL (ref 150–400)
RBC: 2.69 MIL/uL — ABNORMAL LOW (ref 4.22–5.81)
RBC: 3.26 MIL/uL — ABNORMAL LOW (ref 4.22–5.81)
RDW: 18.7 % — ABNORMAL HIGH (ref 11.5–15.5)
RDW: 18.9 % — ABNORMAL HIGH (ref 11.5–15.5)
WBC: 14.5 10*3/uL — ABNORMAL HIGH (ref 4.0–10.5)
WBC: 18.1 10*3/uL — ABNORMAL HIGH (ref 4.0–10.5)
nRBC: 1.3 % — ABNORMAL HIGH (ref 0.0–0.2)
nRBC: 1.4 % — ABNORMAL HIGH (ref 0.0–0.2)

## 2020-04-23 LAB — BASIC METABOLIC PANEL
Anion gap: 8 (ref 5–15)
Anion gap: 9 (ref 5–15)
BUN: 54 mg/dL — ABNORMAL HIGH (ref 6–20)
BUN: 62 mg/dL — ABNORMAL HIGH (ref 6–20)
CO2: 30 mmol/L (ref 22–32)
CO2: 29 mmol/L (ref 22–32)
Calcium: 8.7 mg/dL — ABNORMAL LOW (ref 8.9–10.3)
Calcium: 8.8 mg/dL — ABNORMAL LOW (ref 8.9–10.3)
Chloride: 105 mmol/L (ref 98–111)
Chloride: 107 mmol/L (ref 98–111)
Creatinine, Ser: 0.68 mg/dL (ref 0.61–1.24)
Creatinine, Ser: 0.63 mg/dL (ref 0.61–1.24)
GFR, Estimated: >60 mL/min (ref >60)
GFR, Estimated: >60 mL/min (ref >60)
Glucose, Bld: 161 mg/dL — ABNORMAL HIGH (ref 70–99)
Glucose, Bld: 90 mg/dL (ref 70–99)
Potassium: 3.6 mmol/L (ref 3.5–5.1)
Potassium: 4.1 mmol/L (ref 3.5–5.1)
Sodium: 143 mmol/L (ref 135–145)
Sodium: 145 mmol/L (ref 135–145)

## 2020-04-23 LAB — PREPARE RBC (CROSSMATCH)

## 2020-04-23 LAB — PROTIME-INR
INR: 1.5 — ABNORMAL HIGH (ref 0.8–1.2)
Prothrombin Time: 17.6 seconds — ABNORMAL HIGH (ref 11.4–15.2)

## 2020-04-23 LAB — FIBRINOGEN: Fibrinogen: 272 mg/dL (ref 210–475)

## 2020-04-23 LAB — LACTIC ACID, PLASMA
Lactic Acid, Venous: 1.1 mmol/L (ref 0.5–1.9)
Lactic Acid, Venous: 0.5 mmol/L (ref 0.5–1.9)

## 2020-04-23 LAB — APTT
aPTT: 63 seconds — ABNORMAL HIGH (ref 24–36)
aPTT: 58 seconds — ABNORMAL HIGH (ref 24–36)

## 2020-04-23 LAB — LACTATE DEHYDROGENASE: LDH: 475 U/L — ABNORMAL HIGH (ref 98–192)

## 2020-04-23 MED ORDER — POTASSIUM CHLORIDE 20 MEQ PO PACK
40.0000 meq | PACK | Freq: Once | ORAL | Status: AC
  Administered 2020-04-23: 40 meq via ORAL
  Filled 2020-04-23: qty 2

## 2020-04-23 MED ORDER — MELATONIN 5 MG PO TABS
5.0000 mg | ORAL_TABLET | Freq: Every day | ORAL | Status: DC
  Administered 2020-04-23 – 2020-05-01 (×9): 5 mg
  Filled 2020-04-23 (×9): qty 1

## 2020-04-23 MED ORDER — GABAPENTIN 250 MG/5ML PO SOLN
600.0000 mg | Freq: Three times a day (TID) | ORAL | Status: DC
  Administered 2020-04-23 – 2020-04-25 (×8): 600 mg
  Filled 2020-04-23 (×12): qty 12

## 2020-04-23 MED ORDER — QUETIAPINE FUMARATE 50 MG PO TABS
75.0000 mg | ORAL_TABLET | Freq: Every day | ORAL | Status: DC
  Administered 2020-04-23 – 2020-04-26 (×3): 75 mg
  Filled 2020-04-23 (×3): qty 1

## 2020-04-23 MED ORDER — CLONIDINE HCL 0.1 MG PO TABS
0.1000 mg | ORAL_TABLET | Freq: Two times a day (BID) | ORAL | Status: DC
  Administered 2020-04-23 – 2020-04-29 (×13): 0.1 mg
  Filled 2020-04-23 (×13): qty 1

## 2020-04-23 MED ORDER — POTASSIUM CHLORIDE 20 MEQ PO PACK
40.0000 meq | PACK | Freq: Once | ORAL | Status: AC
  Administered 2020-04-23: 40 meq
  Filled 2020-04-23: qty 2

## 2020-04-23 MED ORDER — FUROSEMIDE 10 MG/ML IJ SOLN
40.0000 mg | Freq: Every day | INTRAMUSCULAR | Status: DC
  Administered 2020-04-23 – 2020-04-26 (×4): 40 mg via INTRAVENOUS
  Filled 2020-04-23 (×4): qty 4

## 2020-04-23 MED ORDER — SODIUM CHLORIDE 0.9% IV SOLUTION: Freq: Once | INTRAVENOUS | Status: AC

## 2020-04-23 NOTE — Procedures (Signed)
Extracorporeal support note  ECLS cannulation date: 03/14/2020 Last circuit change: 04/07/2020  Indication: Acute hypoxic respiratory failure due to ARDS from COVID-19 pneumonia with RV dysfunction.   Configuration: Venovenous  Drainage cannula: 32 French crescent cannula via right IJ Return cannula: Same  Pump speed: 3600 RPM Pump flow: 5 L/min Pump used: Cardio help  Oxygenator: Cardio help O2 blender: 100% Sweep gas: 9L  Circuit check: minimal thrombin in circuit Anticoagulant: Bivalirudin Anticoagulation targets: PTT 60-80  Changes in support:  Con't aggressive mobility and pulmonary hygiene. Decreasing sedating meds slowly. Wean sweep.  Anticipated goals/duration of support: Bridge to recovery.  Multidisciplinary ECMO rounds completed.  Steffanie Dunn, DO 04/23/20 8:15 AM [facility]

## 2020-04-23 NOTE — Progress Notes (Signed)
Dr. Chestine Spore informed of patient's SBP dropping into the 80s Map is holding at 65.  Patient more sleepy but arousal holding some of 2200 meds and midnight meds that can be sedating.  Instructed to give PRN albumin and if BP does not improve may start levo.

## 2020-04-23 NOTE — Progress Notes (Addendum)
ANTICOAGULATION CONSULT NOTE  Pharmacy Consult for bivalirudin Indication: ECMO and VTE  Labs: Recent Labs    04/21/20 0402 04/21/20 0406 04/22/20 0440 04/22/20 0930 04/22/20 1700 04/22/20 2041 04/23/20 0407 04/23/20 0411 04/23/20 0904 04/23/20 1551 04/23/20 1554  HGB 8.9*   < > 8.7*   < > 9.0*   < > 7.8*   < > 9.5* 9.6* 9.9*  HCT 28.4*   < > 28.8*   < > 30.4*   < > 26.0*   < > 28.0* 31.4* 29.0*  PLT 216   < > 216  --  217  --  184  --   --  193  --   APTT 59*   < > 61*  --  61*  --  63*  --   --  58*  --   LABPROT 17.8*  --  17.5*  --   --   --  17.6*  --   --   --   --   INR 1.5*  --  1.5*  --   --   --  1.5*  --   --   --   --   CREATININE 0.73   < > 0.65  --  0.63  --  0.68  --   --  0.63  --    < > = values in this interval not displayed.    Assessment: 89 yoM admitted with COVID-19 PNA with worsening hypoxia, s/p cannulation for ECMO. Pt was started on IV heparin prior to cannulation due to acute DVTs and possible PE, transitioned to bivalirudin with ECMO. Now s/p tPA on 1/5 and tracheostomy on 1/6. Circuit last changed 1/26.  APTT slightly sub-therapeutic at 58 sec.  RN reports that heparin was paused for about 10 min, 2 hours prior to lab draw.  He has been stable on bivalirudin 0.083 mg/kg/hr that I will hold off on adjusting.  No bleeding reported.   Goal of Therapy:  aPTT 60-80 seconds   Plan:  -Continue bivalirudin at 0.083 mg/kg/hr (using order-specific wt 72.1kg) -F/U AM labs and increase bivalidurin if aPTT remains < 60 sec  Skylie Hiott D. Laney Potash, PharmD, BCPS, BCCCP 04/23/2020, 5:55 PM

## 2020-04-23 NOTE — Plan of Care (Signed)
  Problem: Education: Goal: Knowledge of risk factors and measures for prevention of condition will improve 04/23/2020 2215 by Brooks Sailors, RN Outcome: Progressing 04/23/2020 2215 by Brooks Sailors, RN Outcome: Progressing   Problem: Coping: Goal: Psychosocial and spiritual needs will be supported 04/23/2020 2215 by Brooks Sailors, RN Outcome: Progressing 04/23/2020 2215 by Brooks Sailors, RN Outcome: Progressing   Problem: Respiratory: Goal: Will maintain a patent airway 04/23/2020 2215 by Brooks Sailors, RN Outcome: Progressing 04/23/2020 2215 by Brooks Sailors, RN Outcome: Progressing Goal: Complications related to the disease process, condition or treatment will be avoided or minimized 04/23/2020 2215 by Brooks Sailors, RN Outcome: Progressing 04/23/2020 2215 by Brooks Sailors, RN Outcome: Progressing   Problem: Activity: Goal: Ability to tolerate increased activity will improve 04/23/2020 2215 by Brooks Sailors, RN Outcome: Progressing 04/23/2020 2215 by Brooks Sailors, RN Outcome: Progressing   Problem: Respiratory: Goal: Ability to maintain a clear airway and adequate ventilation will improve 04/23/2020 2215 by Brooks Sailors, RN Outcome: Progressing 04/23/2020 2215 by Brooks Sailors, RN Outcome: Progressing   Problem: Role Relationship: Goal: Method of communication will improve 04/23/2020 2215 by Brooks Sailors, RN Outcome: Progressing 04/23/2020 2215 by Brooks Sailors, RN Outcome: Progressing   Problem: Health Behavior/Discharge Planning: Goal: Ability to manage health-related needs will improve 04/23/2020 2215 by Brooks Sailors, RN Outcome: Progressing 04/23/2020 2215 by Brooks Sailors, RN Outcome: Progressing   Problem: Clinical Measurements: Goal: Ability to maintain clinical measurements within normal limits will improve 04/23/2020 2215 by Brooks Sailors, RN Outcome:  Progressing 04/23/2020 2215 by Brooks Sailors, RN Outcome: Progressing Goal: Will remain free from infection 04/23/2020 2215 by Brooks Sailors, RN Outcome: Progressing 04/23/2020 2215 by Brooks Sailors, RN Outcome: Progressing Goal: Diagnostic test results will improve 04/23/2020 2215 by Brooks Sailors, RN Outcome: Progressing 04/23/2020 2215 by Brooks Sailors, RN Outcome: Progressing Goal: Respiratory complications will improve 04/23/2020 2215 by Brooks Sailors, RN Outcome: Progressing 04/23/2020 2215 by Brooks Sailors, RN Outcome: Progressing Goal: Cardiovascular complication will be avoided 04/23/2020 2215 by Brooks Sailors, RN Outcome: Progressing 04/23/2020 2215 by Brooks Sailors, RN Outcome: Progressing   Problem: Activity: Goal: Risk for activity intolerance will decrease 04/23/2020 2215 by Brooks Sailors, RN Outcome: Progressing 04/23/2020 2215 by Brooks Sailors, RN Outcome: Progressing   Problem: Coping: Goal: Level of anxiety will decrease 04/23/2020 2215 by Brooks Sailors, RN Outcome: Progressing 04/23/2020 2215 by Brooks Sailors, RN Outcome: Progressing   Problem: Pain Managment: Goal: General experience of comfort will improve 04/23/2020 2215 by Brooks Sailors, RN Outcome: Progressing 04/23/2020 2215 by Brooks Sailors, RN Outcome: Progressing   Problem: Elimination: Goal: Will not experience complications related to bowel motility 04/23/2020 2215 by Brooks Sailors, RN Outcome: Progressing 04/23/2020 2215 by Brooks Sailors, RN Outcome: Progressing Goal: Will not experience complications related to urinary retention 04/23/2020 2215 by Brooks Sailors, RN Outcome: Progressing 04/23/2020 2215 by Brooks Sailors, RN Outcome: Progressing   Problem: Safety: Goal: Ability to remain free from injury will improve 04/23/2020 2215 by Brooks Sailors, RN Outcome:  Progressing 04/23/2020 2215 by Brooks Sailors, RN Outcome: Progressing   Problem: Skin Integrity: Goal: Risk for impaired skin integrity will decrease 04/23/2020 2215 by Brooks Sailors, RN Outcome: Progressing 04/23/2020 2215 by Brooks Sailors, RN Outcome: Progressing

## 2020-04-23 NOTE — Progress Notes (Signed)
Patient ID: Alex [name], male   DOB: [birth year], 50 y.o.   MRN: [id number]     Advanced Heart Failure Rounding Note  PCP-Cardiologist: No primary care provider on file.   Subjective:    - 12/30: VV ECMO cannulation - 12/31: Left chest tube replaced - 1/2: Extubated. Echo with EF 60-65%, mildly dilated RV with mildly decreased systolic function.  - 1/4: Agitated, suspected aspiration.  Re-intubated.  - 1/5: ECMO cannula repositioned under TEE guidance. TEE showed moderately dilated/moderate-severely dysfunctional RV in setting of hypoxemia. LUE DVT found.  Patient got 1/2 dose of TPA due to initial concern for large PE.  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  - 1/6: Tracheostomy - 1/7: Echo with mild RV dilation/mild RV dysfunction.  - 1/16: Left chest tube out - 1/17: LUE arterial dopplers repeated, showed no obstruction.  - 1/20: Echo with EF 65-70%, mildly D-shaped septum, mildly dilated and mildly dysfunctional RV.  - 1/22: CTA chest: Bilateral upper lobe PEs (suspect chronic), changes c/w ARDS - 1/23: Bronchoscopy showed semi-occlusive ?mass/polyp in the trachea.  - 1/24: Bronchoscopy showed resolution of 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 - 1/26: ECMO circuit changed.  - 2/1: CT chest showed diffuse bronchiectasis as well as diffuse opacity consistent with COVID-19 PNA with ARDS. - 2/3: Trach exchange - 2/5: Extubate to HFNC - 2/6: Enterococcal bacteremia.  Echo showed EF 65-70%, normal-appearing RV, no vegetation noted.  - 2/7: TEE with no vegetation, EF 55-60%, RV low normal function with normal size.   Sweep 9 today, remains on HFNC 10L.  Awake/alert. CXR with severe bilateral airspace disease, no change.  WBCs 14.5, afebrile. He is on vancomycin for Enterococcus faecalis in blood cultures.   Hematuria cleared with changing foley.  Hgb down to 7.8, got 1 unit PRBCs this morning.   I/Os negative with 2 dose 40 mg IV Lasix + Diamox. Weight down 4 lbs.     Hypotension in pm, night meds held.  Now SBP improved.    Has been working with PT and speech, able to speak some. Less cough.   Of note, pre and post gases show expected increase in O2 but only 6 point change in PaCO2.   ECMO parameters: 3650 rpm Flow 5 L/min Pvenous -98 Delta P 32 Sweep 9 HFNC 10 L  ABG 7.41/48/80/96% LDH  534 => 488 => 547 => 461 => 474 => 489 => 553 => 449 => 457 => 421 => 464 => 469 => 432 => 497 => 507 => 475 PTT 63 Lactate 1.1  Objective:   Weight Range: 68.1 kg Body mass index is 25.77 kg/m.   Vital Signs:   Temp:  [98 F (36.7 C)-98.8 F (37.1 C)] 98.3 F (36.8 C) (02/11 0715) Pulse Rate:  [96-119] 103 (02/11 0715) Resp:  [0-34] 0 (02/11 0715) BP: (73-152)/(44-96) 96/60 (02/11 0715) SpO2:  [94 %-100 %] 100 % (02/11 0715) Arterial Line BP: (96-226)/(41-95) 108/55 (02/11 0715) Weight:  [68.1 kg] 68.1 kg (02/11 0546) Last BM Date: 04/21/20  Weight change: Filed Weights   04/21/20 0600 04/22/20 0500 04/23/20 0546  Weight: 69.9 kg 70.3 kg 68.1 kg    Intake/Output:   Intake/Output Summary (Last 24 hours) at 04/23/2020 0740 Last data filed at 04/23/2020 0715 Gross per 24 hour  Intake 2464.31 ml  Output 3400 ml  Net -935.69 ml      Physical Exam    General: NAD Neck: No JVD, no thyromegaly or thyroid nodule.  Lungs: Crackles at bases.  CV: Nondisplaced PMI.  Heart regular S1/S2, no S3/S4, no murmur.  No peripheral edema.    Abdomen: Soft, nontender, no hepatosplenomegaly, no distention.  Skin: Intact without lesions or rashes.  Neurologic: Awake, alert, follows commands.  Extremities: Left digits dry gangrene.  HEENT: Normal.    Telemetry   NSR 100s Personally reviewed   Labs    CBC Recent Labs    04/22/20 1700 04/22/20 2041 04/23/20 0407 04/23/20 0411 04/23/20 0419  WBC 17.7*  --  14.5*  --   --   HGB 9.0*   < > 7.8* 8.2* 6.8*  HCT 30.4*   < > 26.0* 24.0* 20.0*  MCV 98.1  --  96.7  --   --   PLT 217  --  184   --   --    < > = values in this interval not displayed.   Basic Metabolic Panel Recent Labs    16/10/96 1700 04/22/20 2041 04/23/20 0407 04/23/20 0411 04/23/20 0419  NA 144   < > 143 143 147*  K 4.1   < > 3.6 3.6 3.2*  CL 107  --  105  --   --   CO2 28  --  30  --   --   GLUCOSE 134*  --  161*  --   --   BUN 60*  --  54*  --   --   CREATININE 0.63  --  0.68  --   --   CALCIUM 9.0  --  8.7*  --   --    < > = values in this interval not displayed.   Liver Function Tests No results for input(s): AST, ALT, ALKPHOS, BILITOT, PROT, ALBUMIN in the last 72 hours. No results for input(s): LIPASE, AMYLASE in the last 72 hours. Cardiac Enzymes No results for input(s): CKTOTAL, CKMB, CKMBINDEX, TROPONINI in the last 72 hours.  BNP: BNP (last 3 results) No results for input(s): BNP in the last 8760 hours.  ProBNP (last 3 results) No results for input(s): PROBNP in the last 8760 hours.   D-Dimer No results for input(s): DDIMER in the last 72 hours. Hemoglobin A1C No results for input(s): HGBA1C in the last 72 hours. Fasting Lipid Panel No results for input(s): CHOL, HDL, LDLCALC, TRIG, CHOLHDL, LDLDIRECT in the last 72 hours. Thyroid Function Tests No results for input(s): TSH, T4TOTAL, T3FREE, THYROIDAB in the last 72 hours.  Invalid input(s): FREET3  Other results:   Imaging    No results found.   Medications:     Scheduled Medications: . sodium chloride   Intravenous Once  . sodium chloride   Intravenous Once  . acetaZOLAMIDE  250 mg Per Tube Daily  . bethanechol  10 mg Per Tube TID  . carvedilol  25 mg Oral BID WC  . chlorhexidine  15 mL Mouth Rinse BID  . Chlorhexidine Gluconate Cloth  6 each Topical Daily  . chlorpheniramine-HYDROcodone  5 mL Per Tube Q12H  . clonazePAM  1 mg Oral TID  . cloNIDine  0.1 mg Per Tube BID  . dextromethorphan  30 mg Per Tube TID  . docusate  100 mg Per Tube BID  . fiber  1 packet Per Tube BID  . free water  200 mL Per Tube  Q4H  . furosemide  40 mg Intravenous Daily  . gabapentin  600 mg Per Tube Q8H  . insulin aspart  0-20 Units Subcutaneous Q4H  . insulin aspart  10 Units Subcutaneous Q4H  .  insulin detemir  45 Units Subcutaneous BID  . ipratropium-albuterol  3 mL Nebulization TID  . lactobacillus acidophilus  2 tablet Per Tube TID  . liver oil-zinc oxide   Topical 5 X Daily  . mouth rinse  15 mL Mouth Rinse q12n4p  . melatonin  3 mg Per Tube QHS  . metoCLOPramide (REGLAN) injection  10 mg Intravenous Q8H  . nutrition supplement (JUVEN)  1 packet Per Tube BID BM  . oxyCODONE  15 mg Per Tube Q6H  . pantoprazole sodium  40 mg Per Tube QHS  . potassium chloride  40 mEq Oral Once  . QUEtiapine  100-300 mg Per Tube QHS  . sennosides  10 mL Per Tube QHS  . sodium chloride flush  10-40 mL Intracatheter Q12H  . sodium chloride flush  10-40 mL Intracatheter Q12H    Infusions: . sodium chloride    . sodium chloride Stopped (04/16/20 0934)  . sodium chloride 250 mL (04/07/20 2203)  . sodium chloride Stopped (03/12/20 0131)  . sodium chloride Stopped (04/19/20 1959)  . albumin human Stopped (04/23/20 0041)  . bivalirudin (ANGIOMAX) infusion 0.5 mg/mL (Non-ACS indications) 0.083 mg/kg/hr (04/23/20 0200)  . feeding supplement (PIVOT 1.5 CAL) 1,000 mL (04/23/20 0300)  . norepinephrine (LEVOPHED) Adult infusion Stopped (04/19/20 0146)  . vancomycin Stopped (04/22/20 2038)    PRN Medications: Place/Maintain arterial line **AND** sodium chloride, sodium chloride, sodium chloride, acetaminophen (TYLENOL) oral liquid 160 mg/5 mL, albumin human, [DISCONTINUED] lidocaine **AND** albuterol, Gerhardt's butt cream, guaiFENesin, hydrALAZINE, labetalol, lip balm, midazolam, morphine, morphine CONCENTRATE, ondansetron (ZOFRAN) IV, phenol, polyethylene glycol, promethazine-codeine, simethicone, sodium chloride flush, sodium chloride flush   Assessment/Plan   1. Acute hypoxemic respiratory failure: Due to COVID-19 PNA with  bilateral infiltrates.  Refractory hypoxemia, VV-ECMO cannulation on March 20, 2020 with improvement in oxygenation.  Developed left PTX post-subclavian CVL and had left chest tube, the left lung is re-expanded and CT out.  He was extubated 1/2 but reintubated 1/4 with agitation and suspected aspiration.  Tracheostomy 1/6.  CTA chest 1/22 with suspected chronic PEs and ARDS. ECMO cannula repositioned 1/5. ECMO circuit changed 1/26. Extubated to HFNC on 2/5. LDH stable. Sweep at 9, have struggled with hypercarbia from significant dead space ventilation and had a set back with recurrent sepsis.  Restarted abx 2/5 with sepsis, Enterococcus faecalis, now on vancomycin.  I/Os negative with IV Lasix + acetazolamide, weight down 4 lbs. CXR worsened with extubation and loss of PEEP, no change today.  - PaCO2 better, start slow sweep wean => decrease to 8 today and leave.   - Lasix 40 mg IV once today with acetazolamide 250 once daily.   - On vancomycin with Enterococcus.    - Patient has had remdesivir, tocilizumab. - Completed steroid taper. - Continue bivalirudin, goal PTT 65-80.  Discussed dosing with PharmD personally.  - Continue to mobilize => work with PT, stand in bed.   - Coreg continues at 25 mg bid to increase fractional flow via ECMO circuit.  - Will need to consider for lung transplantation, think we need to start thinking along this line once Enterococcal infection treated and he is doing more PT.  2. 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  Clot noted on TEE in RV as well.  TTE 1/2 showed normal EF 60-65%, RV improved (mildly dilated/dysfunctional). TEE on 1/5 with moderate to severe RV dysfunction but patient was hypoxemic.  Had 1/2 dose TPA on 1/5. Echo 1/20 with  mildly dilated/mildly dysfunctional RV. CTA chest 1/22 with chronic-appearing PEs in upper lobes. - Bivalirudin for goal PTT 65-80.  Discussed dosing with PharmD personally. 3.  Left PTX: Left chest tube, lung is re-expanded. Tube now out, stable CXR. 4. Shock: Suspect septic/distributive.  Now resolved, off NE.  5. Anemia: Hgb 7.8, transfuse < 8.  1 unit this morning.  6. AKI: Stable renal indices, follow closely.   7. Hyperglycemia: insulin.  8. HTN: BP lower.     - Decrease clonidine to 0.1 bid and continue to wean down as able.  - Continue Coreg 25 mg bid.   - Suspect arterial line inaccurate, following cuff for now.  9. CHB: Episode of CHB when hypoxemic and with cough (suspect vagal).  NSR since then.   He has occasional short vagal episodes.  - Continue Coreg, watch rhythm.  10. Thrombocytopenia: Resolved  11. Ileus: Resolved. TFs restarted.  - Getting Reglan  - Cor-trak repositioned to post-pyloric placement 12. Ischemic digits: LUE.  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  Repeat study 1/17 showed no obstruction.  - Wound Care following. 13. ID: Group F Strep and Enterococcus faecalis in sputum. Initially completed abx for these bacteria. Now with recurrent sepsis, Enterococcus faecalis in blood now. Vancomycin restarted. TEE 2/7 with no vegetation.  - Continue vancomycin.   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   15. Hypernatremia: Continue free water.  16. Hematuria: Resolved with foley change.   CRITICAL CARE Performed by: Marca Ancona  Total critical care time: 40 minutes  Critical care time was exclusive of separately billable procedures and treating other patients.  Critical care was necessary to treat or prevent imminent or life-threatening deterioration.  Critical care was time spent personally by me on the following activities: development of treatment plan with patient and/or surrogate as well as nursing, discussions with consultants, evaluation of patient's response to treatment, examination of patient, obtaining  history from patient or surrogate, ordering and performing treatments and interventions, ordering and review of laboratory studies, ordering and review of radiographic studies, pulse oximetry and re-evaluation of patient's condition.    Length of Stay: 58  Marca Ancona, MD  04/23/2020, 7:40 AM  Advanced Heart Failure Team Pager [phone] (M-F; 7a - 4p)  Please contact [facility] for night-coverage after hours (4p -7a ) and weekends on [email or web address]

## 2020-04-23 NOTE — Progress Notes (Signed)
Pharmacy Antibiotic Note  Alex [name] is a 50 y.o. male on ECMO. Pharmacy consulted to dose vancomycin for treatment of enterococcal bacteremia. TEE showed no vegetation. Treating nosocomial enterococcus faecalis bacteremia. 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 Ongoing discussion around catheter exchange including a-line and ECMO lines. Will continue on vancomycin until 2/21 tentatively, depending on repeat Bcx. Plan to check VT on 2/12 2 0730.   Plan: - Continue vancomycin 750mg  Q12H  - VT scheduled for 0730 on 2/12  - Will follow renal function, cultures and clinical progress   Height: 5\' 4"  (162.6 cm) Weight: 68.1 kg (150 lb 2.1 oz) IBW/kg (Calculated) : 59.2  Temp (24hrs), Avg:98.5 F (36.9 C), Min:98 F (36.7 C), Max:99.1 F (37.3 C)  Recent Labs  Lab 04/20/20 0737 04/20/20 1530 04/21/20 0402 04/21/20 0833 04/21/20 1603 04/21/20 1610 04/22/20 0440 04/22/20 0452 04/22/20 1624 04/22/20 1700 04/23/20 0407  WBC  --    < > 13.0*  --  13.7*  --  13.9*  --   --  17.7* 14.5*  CREATININE  --    < > 0.73  --  0.64  --  0.65  --   --  0.63 0.68  LATICACIDVEN  --    < >  --  1.1  --  1.0  --  1.3 0.7  --  1.1  VANCOTROUGH 20  --   --   --   --   --   --   --   --   --   --    < > = values in this interval not displayed.    Estimated Creatinine Clearance: 95.6 mL/min (by C-G formula based on SCr of 0.68 mg/dL).    No Known Allergies   Thank you for allowing pharmacy to be a part of this patient's care.  06/20/20, PharmD, MBA Pharmacy Resident [phone] 04/23/2020 10:39 AM

## 2020-04-23 NOTE — Progress Notes (Signed)
Patient's BP recovered and patient very alert and coughing vigorously administered Gabapentin held earlier.

## 2020-04-23 NOTE — Progress Notes (Signed)
ECMO PROGRESS NOTE  NAME:  Alex [name], MRN:  [id number], DOB:  Oct 04, 1970, LOS: 45 ADMISSION DATE:  2020/03/16, CONSULTATION DATE: 02/24/2020 REFERRING MD: Wynona Neat -LBPCCM, CHIEF COMPLAINT: Respiratory failure requiring ECMO  HPI/course in hospital  50 year old man admitted to hospital 12/28 with 1 week history of dyspnea cough nausea and vomiting.  Initially admitted to [facility] and placed on high flow nasal cannula but rapidly failed and required intubation 12/29.  Persistent hypoxic respiratory failure with PF ratio 55 in spite of 18 of PEEP FiO2 0.1 despite paralytics.  Did not improve with prone ventilation  Cannulated for VV ECMO 12/30 via right IJ crescent cannula.  ECMO circuit was changed on 04/07/2020 Iatrogenic pneumothorax from left subclavian triple-lumen placement  12/28 admitted covid, ARDS 12/30 ECMO cannulation, iatrogenic pneumothrorax, DVT, Bivalrudin started           Left subclavian hematoma, chest tube, ceftriaxone/ azithromycin started 12/31 1Unit PRBC 1/1 Palliative consult, noted HTN, fentanyl only 1/2 Extubation I/O positive, left lung re-expanded, EF 60-65%, Lasix 20mg  BID 1/3 BiPAP in place, HTN to 190s, 200s, labetolol for BP, I/O even, cefepime and vancomycin started 1/4 agitation off BiPAP, possible aspiration 1/5 agitation overnight, reintubated      ECHO with RV dilation, ECMO cannula repositioned, LUE DVT (+), 1/2 dose tPA given,      I/O up, on lasix, Epi, NE, vasopressin started, HR/ rhythm issues noted 1/6 Tracheostomy, hypoglycemic when off tube feeds       Cortrack tune issues 1/7 on lasix gtt 4mg /hr with diuresis, agitation improved 1/8 starting steroid taper 1/9 severe agitation, heavy sedation required, lasix gtt 6mg /hr, I/Os (-)       precedex and fentanyl 1/10 Vanc stopped 1/11 sweep to 2, lasix gtt at 4mg /hr, weight up, metoprolol added for HTN 1/12 fevers to 99 noted, lasix gtt and metazolone 1/13 lasix gtt dced 1/14  intermittent HTN, possibly flash pulmonary edema tied to sedation        Ischemic left hand changes, 1 Unit PRBCs 1/15 1/16 sweep from 4 to 2.5, chest tube removed 1/17 sweep at 6, trial of nebulized morphine for cough, LUE dopplers show no obstruction 1/18 two events of 2nd degree type II HB noted with coughing         sweep at 4, IV lasix 40, acetazolamide, distal XLT trach placement         agitation and BP spikes 1/19 agitation, air hunger, seroquel, klonopin, oxy, valproate, ketamine trial 1/20 sweep at 3.5, diuresis results in chugging, albumin given         1 Unit PRBCs, lidocaine nebulizers for cough, ancef started 1/21 sweep to 8, anxiety, I/Os (+) with lasix and acetazolamide 1/22 sweep at 5, chest CT bilateral upper lobe PEs 1/23 sweep at 5, delirium, I/Os even with lasix and acetazolamide         continued coughing, 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 1/24 sweep at 7.5, 1 Unit PRBCs, repeat bronchoscopy showed resolution of 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 1/25 sweep at 7.5, on propofol, lasix gtt at 72ml/hr, I/Os (+)         nebulized morphine and lidocaine for cough 1/26 sweep at 8, propofol/ precedex, cough present when weaned, lasix gtt at 46ml/hr        ECMO circuit changes, levophed started, 1 Unit PRBCs 1/27 sweep at 4, off sedation, delirium, I/Os even        lasix gtt restated, acetazolamide overnight 1/28 sweep at 7.5, HTN  labile 1/29 sweep of 6, patient reports a tickle in throat, cetacaine 1/30 sweep down to 6, cough improved with cetacaine and gabapentin 1/31 sweep at 5, agitation, off acetazolamide, lasix, weight up 2/1 cough, of pulmicort, cetacaine for cough, continued gabapentin, dexmethorphan       CT demonstrated trach placement against back of trachea 2/2 Trach collar trial somewhat effective in managing cough       low dose diltiazem for HR,  2/3 sweep at 4,trach cannula replacement (longer, more flexible bivona); brochoscopy shows  irritated mucosa       IV lasix and acetazolamide 2/4 sweep at 5, I/Os (+), BUN elevated, lasix gtt with diamox,       trach decannulation, increased gabapentin for cough 2/5 start HCAP coverage for rising WBC/temps, check Pct/cultures, CXR worse with lack of PEEP, diuresed well but started getting very hypotensive, sepsis vs. Too dry 04/18/20 blood cx grew E faecalis 2/7 PICC change, TEE  Past Medical History  none  Interim history/subjective:  Vomited last night, then hypotensive. Held BP meds and diuretics overnight. Foley replaced for retention- less bloody.   Objective   Blood pressure 96/60, pulse (!) 103, temperature 98.3 F (36.8 C), temperature source Axillary, resp. rate (!) 0, height 5\' 4"  (1.626 m), weight 68.1 kg, SpO2 100 %.        Intake/Output Summary (Last 24 hours) at 04/23/2020 0717 Last data filed at 04/23/2020 0715 Gross per 24 hour  Intake 2598.31 ml  Output 3400 ml  Net -801.69 ml   Filed Weights   04/21/20 0600 04/22/20 0500 04/23/20 0546  Weight: 69.9 kg 70.3 kg 68.1 kg    Examination: Constitutional: critically ill appearing man laying in bed in NAD HEENT: [place]/AT, eyes anicteric Neck: trach site bandaged Cardiovascular: tachycardic, reg rhythm, no murmurs Respiratory: strong cough, rhales bilaterally, minimal breath sounds Gastrointestinal: soft, NT, ND Skin: left hand bandaged, necrotic digits on left hand. Good perfusion distal to R radial A-line Neurologic: awake, interactive, talking more today speaking in 1-2 word phrases Psychiatric: calm, cooperative with exam   Net  -1.3L, net +1.7L for admission CXR> smaller L cavity in base, persistent bilateral airspace disease  Assessment & Plan:  Acute hypoxemic/hypercapnic respiratory failure due to severe ARDS from COVID-19 pneumonia Probable acute PE, and RV dysfunction s/p TPA Refractory coughing- improved after trach removal but still an issue Strep group F/ enterococcal pneumonia- s/p 10 days  vanc 1/29 - Continue VV ECMO support, wean sweep. Being off PPV may be limiting.  - Current cough regimen:  dextromethorphan, plus ongoing opiates and benzos to control WOB. Decrease gabapentin due to questionable efficacy and sedation. Decreasing benzos slowly- no more than 1 dose adjustment per week. -Con't supplemental O2 via [place]; maintstay of respiratory support is ECMO circuit -IS, flutter, OOB mobility   HTN, now hypotensive overnight.  Trials of reducing HR/BP have resulted in reduced pCO2 (likely related to fractional flow through ECMO circuit) -Continue coreg. No longer on diltiazem due to hypotension;. -Decrease clonidine to BID- taper slowly.   Acute delirium, with agitation- improved - RASS goal -1 to 0, PRN, morphine PRN, oxycodone 15mg  q6h, qHS seroquel - Decrease kloninpin to 1mg  tid to help improve alertness during the day> decrease TDD slowly  Sepsis secondary to E faecalis bacteremia- secondary to PNA, has grown enterococcus in sputum prior, previous vanc course completed 1/29. TEE on 2/7 without vegetations. PICC changed on 2/7. -Continue vanc through 2/21 per ID -urine culture pending  Gastroparesis with some element  ileus: improved but occasional vomiting spells with coughing attacks - Reglan increased back to 10mg  Q8h overnight. 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 One episode of hypoglycemia overnight a/w TF held, otherwise controlled. -con't levemir 45 units BID, SSI prn -TF coverage to 10 units Q4h with hold parameters  Acute urinary retention; no evidence of UTI based on UA Hematuria -failed condom cath trial, foley  replaced 2/7; again on 2/11 -con't bethenacol (off since 1/30)  -urine culture pending  Hypernatremia, resolved -con't to monitor -lasix and diamox once daily  Deconditioning -increase mobility today now that sepsis is resolving  -con't working with PT, OT, SLP   Daily Goals Checklist  Pain/Anxiety/Delirium protocol (if indicated): see above VAP protocol (if indicated):   DVT prophylaxis: bivalirudin GI prophylaxis: pantoprazole Glucose control: Euglcyemic on basal bolus insulin Mobility/therapy needs: Mobilization as tolerated, appreciate PT's help Code Status: Full code Disposition: ICU   This patient is critically ill with multiple organ system failure which requires frequent high complexity decision making, assessment, support, evaluation, and titration of therapies. This was completed through the application of advanced monitoring technologies and extensive interpretation of multiple databases. During this encounter critical care time was devoted to patient care services described in this note for 47 minutes.   4/11, DO 04/23/20 8:08 AM [facility]  From 7AM- 7PM if no response to pager, please call [phone]. After hours, 7PM- 7AM, please call Elink  [phone].

## 2020-04-23 NOTE — Progress Notes (Signed)
ANTICOAGULATION CONSULT NOTE  Pharmacy Consult for bivalirudin Indication: ECMO and VTE  Labs: Recent Labs    04/21/20 0402 04/21/20 0406 04/22/20 0440 04/22/20 0930 04/22/20 1700 04/22/20 2041 04/23/20 0407 04/23/20 0411 04/23/20 0419 04/23/20 0904  HGB 8.9*   < > 8.7*   < > 9.0*   < > 7.8* 8.2* 6.8* 9.5*  HCT 28.4*   < > 28.8*   < > 30.4*   < > 26.0* 24.0* 20.0* 28.0*  PLT 216   < > 216  --  217  --  184  --   --   --   APTT 59*   < > 61*  --  61*  --  63*  --   --   --   LABPROT 17.8*  --  17.5*  --   --   --  17.6*  --   --   --   INR 1.5*  --  1.5*  --   --   --  1.5*  --   --   --   CREATININE 0.73   < > 0.65  --  0.63  --  0.68  --   --   --    < > = values in this interval not displayed.    Assessment: 16 yoM admitted with COVID-19 PNA with worsening hypoxia, s/p cannulation for ECMO. Pt was started on IV heparin prior to cannulation due to acute DVTs and possible PE, transitioned to bivalirudin with ECMO. Now s/p tPA on 1/5 and tracheostomy on 1/6. Circuit last changed 1/26.  APTT therapeutic at 63, on bivalirudin 0.083 mg/kg/hr. Hgb 6.8, plt 184. Having bleeding in urine - foley changed and hematuria resolved. Circuit remains unchanged.   Goal of Therapy:  aPTT 60-80 seconds   Plan:  -Continue bivalirudin at 0.083 mg/kg/hr (using order-specific wt 72.1kg) -Monitor q12h aPTT/CBC, LDH, and for s/sx of bleeding  Jenetta Downer, [facility] Clinical Pharmacist  04/23/2020 10:29 AM   [facility] pharmacy phone numbers are listed on [email or web address]

## 2020-04-23 NOTE — Discharge Summary (Incomplete)
Physician Discharge Summary         Patient ID: Alex [name] MRN: [id number] DOB/AGE: Jul 12, 1970 50 y.o.  Admit date: 03/01/2020 Discharge date: 04/23/2020  Discharge Diagnoses:    Acute hypoxic and hypercapnic respiratory failure due to Covid pneumonia with severe ARDS Acute pulmonary emboli with right ventricular dysfunction status post TPA Group F streptococcus and Enterococcus pneumonia status post 10 days of IV vancomycin 1/29 Septic shock with E faecalis bacteremia secondary to pneumonia 04/17/20: Transesophageal echocardiogram on 2/7 was negative for vegetation. 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  Initially admitted to [facility] long hospital and placed on high flow nasal cannula but rapidly failed and required intubation 12/29.  Persistent hypoxic respiratory failure with PF ratio 55 in spite of 18 of PEEP FiO2 0.1 despite paralytics.  Did not improve with prone ventilation. Remainder of hospital course from 12/30 up until present day: 12/30 ECMO cannulation-- (56F [place] cannula RIJ), iatrogenic pneumothrorax, DVT, Bivalrudin started Left subclavian hematoma, chest tube, ceftriaxone/ azithromycin started 12/31 1Unit PRBC 1/1 Palliative consult, noted HTN, fentanyl only 1/2 Extubation I/O positive, left lung re-expanded, EF 60-65%, Lasix 20mg  BID 1/3 BiPAP in place, HTN to 190s, 200s, labetolol for BP, I/O even, cefepime and vancomycin started 1/4 agitation off BiPAP, possible aspiration 1/5 agitation overnight,  reintubated ECHO with RV dilation, ECMO cannula repositioned, LUE DVT (+), 1/2 dose tPA given, I/O up, on lasix, Epi, NE, vasopressin started, HR/ rhythm issues noted 1/6 Tracheostomy, hypoglycemic when off tube feeds Cortrack tube issues 1/7 on lasix gtt 4mg /hr with diuresis, agitation improved 1/8 starting steroid taper 1/9 severe agitation, heavy sedation required, lasix gtt 6mg /hr, I/Os (-) precedex and fentanyl 1/10 Vanc stopped 1/11 sweep to 2, lasix gtt at 4mg /hr, weight up, metoprolol added for HTN 1/12 fevers to 99 noted, lasix gtt and metazolone 1/13 lasix gtt dced 1/14 intermittent HTN, possibly flash pulmonary edema tied to sedation Ischemic left hand changes, 1 Unit PRBCs 1/15 1/16 sweep from 4 to 2.5, chest tube removed 1/17 sweep at 6, trial of nebulized morphine for cough, LUE dopplers show no obstruction 1/18 two events of 2nd degree type II HB noted with coughing sweep at 4, IV lasix 40, acetazolamide, distal XLT trach placement agitation and BP spikes 1/19 agitation, air hunger, seroquel, klonopin, oxy, valproate, ketamine trial 1/20 sweep at 3.5, diuresis results in chugging, albumin given 1 Unit PRBCs, lidocaine nebulizers for cough, ancef started 1/21 sweep to 8, anxiety, I/Os (+) with lasix and acetazolamide 1/22 sweep at 5, chest CT bilateral upper lobe PEs 1/23 sweep at 5, delirium, I/Os even with lasix and acetazolamide continued coughing, 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 1/24 sweep at 7.5, 1 Unit PRBCs, repeat bronchoscopy showed resolution of 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 1/25 sweep at 7.5, on propofol, lasix gtt at 40ml/hr, I/Os (+) nebulized morphine and lidocaine for cough 1/26 sweep at 8, propofol/ precedex, cough present when weaned, lasix gtt at 58ml/hr ECMO circuit changes, levophed started, 1 Unit PRBCs 1/27 sweep at 4, off sedation, delirium, I/Os  even lasix gtt restated, acetazolamide overnight 1/28 sweep at 7.5, HTN labile 1/29 sweep of 6, patient reports a tickle  in throat, cetacaine 1/30 sweep down to 6, cough improved with cetacaine and gabapentin 1/31 sweep at 5, agitation, off acetazolamide, lasix, weight up 2/1 cough, of pulmicort, cetacaine for cough, continued gabapentin, dexmethorphan CT demonstrated trach placement against back of trachea 2/2 Trach collar trial somewhat effective in managing cough low dose diltiazem for HR,  2/3 sweep at 4,trach cannula replacement (longer, more flexible bivona); brochoscopy shows irritated mucosa IV lasix and acetazolamide 2/4 sweep at 5, I/Os (+), BUN elevated, lasix gtt with diamox,  trach decannulation, increased gabapentin for cough 2/5 start HCAP coverage for rising WBC/temps, check Pct/cultures, CXR worse with lack of PEEP, diuresed well but started getting very hypotensive, sepsis vs. Too dry 04/18/20 blood cx grew E faecalis 2/7 PICC change, TEE 2/11 1 unit pRBCs today, pulled to standing with assistance, 2.5 hours in chair  This week due to work of breathing had sweep increased to 9L, which improved respiratory mechanics. Decreasing benzos slowly to prevent withdrawal, decreasing gabapentin. Tried to decrease reglan, but had recurrent vomiting and does increased. Coughing has been better controlled with phenergan cough syrup.  As of 2/11 remains on VV ECMO support. He is awake, no tracheostomy. Remains on ECMO supported on sweep 8L on 100%, 3650 RPMs with 4.8L flow. Last circuit change 04/07/2020.   Discharge Plan by Active Problems       Significant Hospital tests/ studies   Procedures    Culture data/antimicrobials      Consults      Discharge Exam: Blood Pressure (Abnormal) 143/87   Pulse (Abnormal) 114   Temperature 98.4 F (36.9 C) (Core)   Respiration (Abnormal) 28   Height 5\' 4"  (1.626 m)   Weight 68.1 kg   Oxygen Saturation  100%   Body Mass Index 25.77 kg/m   ****** Labs at discharge   Lab Results  Component Value Date   CREATININE 0.68 04/23/2020   BUN 54 (H) 04/23/2020   NA 145 04/23/2020   K 4.2 04/23/2020   CL 105 04/23/2020   CO2 30 04/23/2020   Lab Results  Component Value Date   WBC 14.5 (H) 04/23/2020   HGB 9.5 (L) 04/23/2020   HCT 28.0 (L) 04/23/2020   MCV 96.7 04/23/2020   PLT 184 04/23/2020   Lab Results  Component Value Date   ALT 32 04/11/2020   AST 19 04/11/2020   ALKPHOS 143 (H) 04/11/2020   BILITOT 0.8 04/11/2020   Lab Results  Component Value Date   INR 1.5 (H) 04/23/2020   INR 1.5 (H) 04/22/2020   INR 1.5 (H) 04/21/2020    Current radiological studies    DG CHEST PORT 1 VIEW  Result Date: 04/23/2020 CLINICAL DATA:  ECMO. EXAM: PORTABLE CHEST 1 VIEW COMPARISON:  04/22/2020 FINDINGS: 0532 hours. A feeding tube passes into the stomach although the distal tip position is not included on the film. Right ECMO cannula again noted. The diffuse bilateral lung opacification is similar to prior. Left PICC line tip overlies the proximal SVC. IMPRESSION: 1. No substantial interval change in exam. Diffuse bilateral lung opacification. Electronically Signed   By: 06/20/2020 M.D.   On: 04/23/2020 07:55   DG CHEST PORT 1 VIEW  Result Date: 04/22/2020 CLINICAL DATA:  COVID positive.  ECMO. EXAM: PORTABLE CHEST 1 VIEW COMPARISON:  04/21/2020 FINDINGS: Severe whiteout of both lungs with minimal aeration remaining. No pneumothorax. Left PICC tip in the SVC. Right jugular ECMO catheter passes through the right atrium into the hepatic IVC. No change  from yesterday. IMPRESSION: No interval change.  Severe white out both lungs. Electronically Signed   By: Marlan Palau M.D.   On: 04/22/2020 08:05    Disposition:        Allergies as of 04/23/2020   No Known Allergies   Med Rec must be completed prior to using this [facility]***        Follow-up appointment   *** Discharge  Condition:    {condition:18240}  Physician Statement:   The Patient was personally examined, the discharge assessment and plan has been personally reviewed and I agree with ACNP Babcock's assessment and plan. *** minutes of time have been dedicated to discharge assessment, planning and discharge instructions.   Signed: Shelby Mattocks 04/23/2020, 2:38 PM

## 2020-04-24 ENCOUNTER — Inpatient Hospital Stay (HOSPITAL_COMMUNITY): Payer: Medicaid Other

## 2020-04-24 LAB — TYPE AND SCREEN
ABO/RH(D): B POS
Antibody Screen: NEGATIVE
Unit division: 0
Unit division: 0
Unit division: 0
Unit division: 0

## 2020-04-24 LAB — CBC
HCT: 28 % — ABNORMAL LOW (ref 39.0–52.0)
HCT: 28.3 % — ABNORMAL LOW (ref 39.0–52.0)
Hemoglobin: 8.8 g/dL — ABNORMAL LOW (ref 13.0–17.0)
Hemoglobin: 8.9 g/dL — ABNORMAL LOW (ref 13.0–17.0)
MCH: 29.7 pg (ref 26.0–34.0)
MCH: 29.8 pg (ref 26.0–34.0)
MCHC: 31.4 g/dL (ref 30.0–36.0)
MCHC: 31.4 g/dL (ref 30.0–36.0)
MCV: 94.6 fL (ref 80.0–100.0)
MCV: 94.6 fL (ref 80.0–100.0)
Platelets: 180 10*3/uL (ref 150–400)
Platelets: 182 10*3/uL (ref 150–400)
RBC: 2.96 MIL/uL — ABNORMAL LOW (ref 4.22–5.81)
RBC: 2.99 MIL/uL — ABNORMAL LOW (ref 4.22–5.81)
RDW: 18.6 % — ABNORMAL HIGH (ref 11.5–15.5)
RDW: 18.6 % — ABNORMAL HIGH (ref 11.5–15.5)
WBC: 14.9 10*3/uL — ABNORMAL HIGH (ref 4.0–10.5)
WBC: 14.5 10*3/uL — ABNORMAL HIGH (ref 4.0–10.5)
nRBC: 1.1 % — ABNORMAL HIGH (ref 0.0–0.2)
nRBC: 0.7 % — ABNORMAL HIGH (ref 0.0–0.2)

## 2020-04-24 LAB — BASIC METABOLIC PANEL
Anion gap: 8 (ref 5–15)
Anion gap: 11 (ref 5–15)
BUN: 56 mg/dL — ABNORMAL HIGH (ref 6–20)
BUN: 58 mg/dL — ABNORMAL HIGH (ref 6–20)
CO2: 29 mmol/L (ref 22–32)
CO2: 27 mmol/L (ref 22–32)
Calcium: 8.8 mg/dL — ABNORMAL LOW (ref 8.9–10.3)
Calcium: 8.8 mg/dL — ABNORMAL LOW (ref 8.9–10.3)
Chloride: 105 mmol/L (ref 98–111)
Chloride: 105 mmol/L (ref 98–111)
Creatinine, Ser: 0.67 mg/dL (ref 0.61–1.24)
Creatinine, Ser: 0.62 mg/dL (ref 0.61–1.24)
GFR, Estimated: >60 mL/min (ref >60)
GFR, Estimated: >60 mL/min (ref >60)
Glucose, Bld: 189 mg/dL — ABNORMAL HIGH (ref 70–99)
Glucose, Bld: 57 mg/dL — ABNORMAL LOW (ref 70–99)
Potassium: 4 mmol/L (ref 3.5–5.1)
Potassium: 3.6 mmol/L (ref 3.5–5.1)
Sodium: 142 mmol/L (ref 135–145)
Sodium: 143 mmol/L (ref 135–145)

## 2020-04-24 LAB — POCT I-STAT 7, (LYTES, BLD GAS, ICA,H+H)
Acid-Base Excess: 4 mmol/L — ABNORMAL HIGH (ref 0.0–2.0)
Acid-Base Excess: 3 mmol/L — ABNORMAL HIGH (ref 0.0–2.0)
Acid-Base Excess: 5 mmol/L — ABNORMAL HIGH (ref 0.0–2.0)
Bicarbonate: 30.3 mmol/L — ABNORMAL HIGH (ref 20.0–28.0)
Bicarbonate: 29.9 mmol/L — ABNORMAL HIGH (ref 20.0–28.0)
Bicarbonate: 30.2 mmol/L — ABNORMAL HIGH (ref 20.0–28.0)
Calcium, Ion: 1.25 mmol/L (ref 1.15–1.40)
Calcium, Ion: 1.27 mmol/L (ref 1.15–1.40)
Calcium, Ion: 1.27 mmol/L (ref 1.15–1.40)
HCT: 27 % — ABNORMAL LOW (ref 39.0–52.0)
HCT: 29 % — ABNORMAL LOW (ref 39.0–52.0)
HCT: 27 % — ABNORMAL LOW (ref 39.0–52.0)
Hemoglobin: 9.2 g/dL — ABNORMAL LOW (ref 13.0–17.0)
Hemoglobin: 9.9 g/dL — ABNORMAL LOW (ref 13.0–17.0)
Hemoglobin: 9.2 g/dL — ABNORMAL LOW (ref 13.0–17.0)
O2 Saturation: 90 %
O2 Saturation: 96 %
O2 Saturation: 96 %
Patient temperature: 37.2
Patient temperature: 37.1
Patient temperature: 37
Potassium: 4 mmol/L (ref 3.5–5.1)
Potassium: 4 mmol/L (ref 3.5–5.1)
Potassium: 3.7 mmol/L (ref 3.5–5.1)
Sodium: 144 mmol/L (ref 135–145)
Sodium: 144 mmol/L (ref 135–145)
Sodium: 142 mmol/L (ref 135–145)
TCO2: 32 mmol/L (ref 22–32)
TCO2: 32 mmol/L (ref 22–32)
TCO2: 32 mmol/L (ref 22–32)
pCO2 arterial: 51.6 mmHg — ABNORMAL HIGH (ref 32.0–48.0)
pCO2 arterial: 57 mmHg — ABNORMAL HIGH (ref 32.0–48.0)
pCO2 arterial: 49 mmHg — ABNORMAL HIGH (ref 32.0–48.0)
pH, Arterial: 7.377 (ref 7.350–7.450)
pH, Arterial: 7.328 — ABNORMAL LOW (ref 7.350–7.450)
pH, Arterial: 7.397 (ref 7.350–7.450)
pO2, Arterial: 62 mmHg — ABNORMAL LOW (ref 83.0–108.0)
pO2, Arterial: 87 mmHg (ref 83.0–108.0)
pO2, Arterial: 81 mmHg — ABNORMAL LOW (ref 83.0–108.0)

## 2020-04-24 LAB — LACTIC ACID, PLASMA
Lactic Acid, Venous: 1.1 mmol/L (ref 0.5–1.9)
Lactic Acid, Venous: 0.5 mmol/L (ref 0.5–1.9)

## 2020-04-24 LAB — CULTURE, BLOOD (ROUTINE X 2)
Culture: NO GROWTH
Culture: NO GROWTH
Special Requests: ADEQUATE

## 2020-04-24 LAB — BPAM RBC
Blood Product Expiration Date: 202202202359
Blood Product Expiration Date: 202202212359
Blood Product Expiration Date: 202202212359
Blood Product Expiration Date: 202203022359
ISSUE DATE / TIME: 202202110517
Unit Type and Rh: 7300
Unit Type and Rh: 7300
Unit Type and Rh: 7300
Unit Type and Rh: 7300

## 2020-04-24 LAB — URINE CULTURE: Culture: NO GROWTH

## 2020-04-24 LAB — GLUCOSE, CAPILLARY
Glucose-Capillary: 107 mg/dL — ABNORMAL HIGH (ref 70–99)
Glucose-Capillary: 109 mg/dL — ABNORMAL HIGH (ref 70–99)
Glucose-Capillary: 126 mg/dL — ABNORMAL HIGH (ref 70–99)
Glucose-Capillary: 126 mg/dL — ABNORMAL HIGH (ref 70–99)
Glucose-Capillary: 145 mg/dL — ABNORMAL HIGH (ref 70–99)
Glucose-Capillary: 177 mg/dL — ABNORMAL HIGH (ref 70–99)
Glucose-Capillary: 53 mg/dL — ABNORMAL LOW (ref 70–99)
Glucose-Capillary: 60 mg/dL — ABNORMAL LOW (ref 70–99)
Glucose-Capillary: 67 mg/dL — ABNORMAL LOW (ref 70–99)
Glucose-Capillary: 93 mg/dL (ref 70–99)

## 2020-04-24 LAB — PROTIME-INR
INR: 1.5 — ABNORMAL HIGH (ref 0.8–1.2)
Prothrombin Time: 17.9 seconds — ABNORMAL HIGH (ref 11.4–15.2)

## 2020-04-24 LAB — APTT
aPTT: 58 seconds — ABNORMAL HIGH (ref 24–36)
aPTT: 66 seconds — ABNORMAL HIGH (ref 24–36)

## 2020-04-24 LAB — VANCOMYCIN, TROUGH: Vancomycin Tr: 15 ug/mL (ref 15–20)

## 2020-04-24 LAB — FIBRINOGEN: Fibrinogen: 317 mg/dL (ref 210–475)

## 2020-04-24 LAB — LACTATE DEHYDROGENASE: LDH: 501 U/L — ABNORMAL HIGH (ref 98–192)

## 2020-04-24 MED ORDER — DEXTROSE 50 % IV SOLN
INTRAVENOUS | Status: AC
  Administered 2020-04-24: 50 mL
  Filled 2020-04-24: qty 50

## 2020-04-24 MED ORDER — CLONAZEPAM 1 MG PO TABS
1.0000 mg | ORAL_TABLET | Freq: Three times a day (TID) | ORAL | Status: AC
  Administered 2020-04-24 – 2020-04-26 (×6): 1 mg
  Filled 2020-04-24 (×7): qty 1

## 2020-04-24 MED ORDER — POTASSIUM CHLORIDE 20 MEQ PO PACK
40.0000 meq | PACK | Freq: Once | ORAL | Status: AC
  Administered 2020-04-24: 40 meq
  Filled 2020-04-24: qty 2

## 2020-04-24 MED ORDER — VANCOMYCIN HCL 1000 MG/200ML IV SOLN
1000.0000 mg | Freq: Two times a day (BID) | INTRAVENOUS | Status: DC
  Administered 2020-04-24 – 2020-05-03 (×19): 1000 mg via INTRAVENOUS
  Filled 2020-04-24 (×20): qty 200

## 2020-04-24 MED ORDER — OXYCODONE HCL 5 MG PO TABS
15.0000 mg | ORAL_TABLET | Freq: Three times a day (TID) | ORAL | Status: DC
Start: 2020-04-24 — End: 2020-05-02
  Administered 2020-04-24 – 2020-05-01 (×21): 15 mg
  Filled 2020-04-24 (×23): qty 3

## 2020-04-24 MED ORDER — CARVEDILOL 25 MG PO TABS
25.0000 mg | ORAL_TABLET | Freq: Two times a day (BID) | ORAL | Status: DC
  Administered 2020-04-24 – 2020-05-06 (×25): 25 mg
  Filled 2020-04-24 (×26): qty 1

## 2020-04-24 NOTE — Procedures (Signed)
Extracorporeal support note  ECLS cannulation date: 02/27/2020 Last circuit change: 04/07/2020  Indication: Acute hypoxic respiratory failure due to ARDS from COVID-19 pneumonia with RV dysfunction.   Configuration: Venovenous  Drainage cannula: 32 French crescent cannula via right IJ Return cannula: Same  Pump speed: 3550 RPM Pump flow: 4.85 L/min Pump used: Cardio help  Oxygenator: Cardio help O2 blender: 100% Sweep gas: 8L  Circuit check: minimal thrombin in circuit Anticoagulant: Bivalirudin Anticoagulation targets: PTT 60-80  Changes in support: Aggressive mobility. His goal is to be able to advance his diet today- SLP coming to evaluate again today. Tapering off sedating meds slowly to minimize changes of withdrawal. Wean sweep as tolerated. Needs to have appropriate work of breathing to minimize further lung damage.  Anticipated goals/duration of support: Bridge to recovery vs transplant. Ongoing communication with [facility] lung transplant.  Multidisciplinary ECMO rounds completed.  Steffanie Dunn, DO 04/24/20 11:14 AM [facility]

## 2020-04-24 NOTE — Progress Notes (Signed)
ANTICOAGULATION CONSULT NOTE  Pharmacy Consult for bivalirudin Indication: ECMO and VTE  Labs: Recent Labs    04/22/20 0440 04/22/20 0930 04/23/20 0407 04/23/20 0411 04/23/20 1551 04/23/20 1554 04/24/20 0406 04/24/20 0414 04/24/20 1217 04/24/20 1640 04/24/20 1648  HGB 8.7*   < > 7.8*   < > 9.6*   < > 8.8*   < > 9.9* 8.9* 9.2*  HCT 28.8*   < > 26.0*   < > 31.4*   < > 28.0*   < > 29.0* 28.3* 27.0*  PLT 216   < > 184  --  193  --  180  --   --  182  --   APTT 61*   < > 63*  --  58*  --  58*  --   --  66*  --   LABPROT 17.5*  --  17.6*  --   --   --  17.9*  --   --   --   --   INR 1.5*  --  1.5*  --   --   --  1.5*  --   --   --   --   CREATININE 0.65   < > 0.68  --  0.63  --  0.67  --   --  0.62  --    < > = values in this interval not displayed.    Assessment: 71 yoM admitted with COVID-19 PNA with worsening hypoxia, s/p cannulation for ECMO. Pt was started on IV heparin prior to cannulation due to acute DVTs and possible PE, transitioned to bivalirudin with ECMO. Now s/p tPA on 1/5 and tracheostomy on 1/6. Circuit last changed 1/26.  APTT is therapeutic at 66 sec.  No bleeding reported.  Goal of Therapy:  aPTT 60-80 seconds   Plan:  -Continue bivalirudin at 0.085 mg/kg/hr (using order-specific wt 72.1kg) -Monitor q12h aPTT/CBC, LDH, and for s/sx of bleeding  Demtrius Rounds D. Laney Potash, PharmD, BCPS, BCCCP 04/24/2020, 6:19 PM

## 2020-04-24 NOTE — Progress Notes (Signed)
ECMO PROGRESS NOTE  NAME:  Alex [name], MRN:  [id number], DOB:  05-30-1970, LOS: 46 ADMISSION DATE:  03/01/2020, CONSULTATION DATE: 03/23/20 REFERRING MD: Wynona Neat -LBPCCM, CHIEF COMPLAINT: Respiratory failure requiring ECMO  HPI/course in hospital  50 year old man admitted to hospital 12/28 with 1 week history of dyspnea cough nausea and vomiting.  Initially admitted to [facility] long hospital and placed on high flow nasal cannula but rapidly failed and required intubation 12/29.  Persistent hypoxic respiratory failure with PF ratio 55 in spite of 18 of PEEP FiO2 0.1 despite paralytics.  Did not improve with prone ventilation  Cannulated for VV ECMO 12/30 via right IJ crescent cannula.  ECMO circuit was changed on 04/07/2020 Iatrogenic pneumothorax from left subclavian triple-lumen placement  12/28 admitted covid, ARDS 12/30 ECMO cannulation, iatrogenic pneumothrorax, DVT, Bivalrudin started           Left subclavian hematoma, chest tube, ceftriaxone/ azithromycin started 12/31 1Unit PRBC 1/1 Palliative consult, noted HTN, fentanyl only 1/2 Extubation I/O positive, left lung re-expanded, EF 60-65%, Lasix 20mg  BID 1/3 BiPAP in place, HTN to 190s, 200s, labetolol for BP, I/O even, cefepime and vancomycin started 1/4 agitation off BiPAP, possible aspiration 1/5 agitation overnight, reintubated      ECHO with RV dilation, ECMO cannula repositioned, LUE DVT (+), 1/2 dose tPA given,      I/O up, on lasix, Epi, NE, vasopressin started, HR/ rhythm issues noted 1/6 Tracheostomy, hypoglycemic when off tube feeds       Cortrack tune issues 1/7 on lasix gtt 4mg /hr with diuresis, agitation improved 1/8 starting steroid taper 1/9 severe agitation, heavy sedation required, lasix gtt 6mg /hr, I/Os (-)       precedex and fentanyl 1/10 Vanc stopped 1/11 sweep to 2, lasix gtt at 4mg /hr, weight up, metoprolol added for HTN 1/12 fevers to 99 noted, lasix gtt and metazolone 1/13 lasix gtt dced 1/14  intermittent HTN, possibly flash pulmonary edema tied to sedation        Ischemic left hand changes, 1 Unit PRBCs 1/15 1/16 sweep from 4 to 2.5, chest tube removed 1/17 sweep at 6, trial of nebulized morphine for cough, LUE dopplers show no obstruction 1/18 two events of 2nd degree type II HB noted with coughing         sweep at 4, IV lasix 40, acetazolamide, distal XLT trach placement         agitation and BP spikes 1/19 agitation, air hunger, seroquel, klonopin, oxy, valproate, ketamine trial 1/20 sweep at 3.5, diuresis results in chugging, albumin given         1 Unit PRBCs, lidocaine nebulizers for cough, ancef started 1/21 sweep to 8, anxiety, I/Os (+) with lasix and acetazolamide 1/22 sweep at 5, chest CT bilateral upper lobe PEs 1/23 sweep at 5, delirium, I/Os even with lasix and acetazolamide         continued coughing, 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 1/24 sweep at 7.5, 1 Unit PRBCs, repeat bronchoscopy showed resolution of 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 1/25 sweep at 7.5, on propofol, lasix gtt at 91ml/hr, I/Os (+)         nebulized morphine and lidocaine for cough 1/26 sweep at 8, propofol/ precedex, cough present when weaned, lasix gtt at 26ml/hr        ECMO circuit changes, levophed started, 1 Unit PRBCs 1/27 sweep at 4, off sedation, delirium, I/Os even        lasix gtt restated, acetazolamide overnight 1/28 sweep at 7.5, HTN  labile 1/29 sweep of 6, patient reports a tickle in throat, cetacaine 1/30 sweep down to 6, cough improved with cetacaine and gabapentin 1/31 sweep at 5, agitation, off acetazolamide, lasix, weight up 2/1 cough, of pulmicort, cetacaine for cough, continued gabapentin, dexmethorphan       CT demonstrated trach placement against back of trachea 2/2 Trach collar trial somewhat effective in managing cough       low dose diltiazem for HR,  2/3 sweep at 4,trach cannula replacement (longer, more flexible bivona); brochoscopy shows  irritated mucosa       IV lasix and acetazolamide 2/4 sweep at 5, I/Os (+), BUN elevated, lasix gtt with diamox,       trach decannulation, increased gabapentin for cough 2/5 start HCAP coverage for rising WBC/temps, check Pct/cultures, CXR worse with lack of PEEP, diuresed well but started getting very hypotensive, sepsis vs. Too dry 04/18/20 blood cx grew E faecalis 2/7 PICC change, TEE  Past Medical History  none  Interim history/subjective:  Nausea persists, no vomiting. He denies pain.  Objective   Blood pressure 117/74, pulse 98, temperature 99 F (37.2 C), temperature source Core, resp. rate 13, height 5\' 4"  (1.626 m), weight 68.1 kg, SpO2 99 %.        Intake/Output Summary (Last 24 hours) at 04/24/2020 0733 Last data filed at 04/24/2020 0700 Gross per 24 hour  Intake 3552.79 ml  Output 3330 ml  Net 222.79 ml   Filed Weights   04/22/20 0500 04/23/20 0546 04/24/20 0605  Weight: 70.3 kg 68.1 kg 68.1 kg    Examination: Constitutional: Critically ill-appearing man sitting up in the chair no acute distress.  Remains on nasal cannula and ECMO. HEENT: [place]/AT, eyes anicteric Neck: Trach site bandaged.  Right IJ ECMO cannula. Cardiovascular: Tachycardic, regular rhythm, no murmurs Respiratory: Able to speak in longer phrases, minimal breath sounds. Gastrointestinal: Soft, nontender, nondistended Skin: Right radial A-line with good distal perfusion.  Necrotic first digit on the left, hand bandaged. Neurologic: Awake and alert, answering questions appropriately, tracking around the room.  Moving all extremities. Psychiatric: Calm, cooperative with exam   Net  +500cc , net +1L for admission CXR> small left dependent cavity, improving aeration, air bronchograms especially on the left persist.  Assessment & Plan:  Acute hypoxemic/hypercapnic respiratory failure due to severe ARDS from COVID-19 pneumonia Probable acute PE, and RV dysfunction s/p TPA Refractory coughing- improved  after trach removal but still an issue Strep group F/ enterococcal pneumonia- s/p 10 days vanc 1/29 - Continue VV ECMO support, wean sweep. Being off PPV may be limiting.  - Current cough regimen:  dextromethorphan, plus ongoing opiates and benzos to control WOB. Decrease gabapentin due to questionable efficacy and sedation.  Slowly decreasing opiates--recent every 8 hours on 2/12.  Decreasing benzos slowly- no more than 1 dose adjustment per week-next dose adjustment on 2/14 -Continue supplemental oxygen via nasal cannula, can decrease his oxygen improves. -IS, flutter, OOB mobility    HTN, now hypotensive overnight.  Trials of reducing HR/BP have resulted in reduced pCO2 (likely related to fractional flow through ECMO circuit) -Continue coreg. No longer on diltiazem due to hypotension;. -Decreased clonidine to BID this week- taper slowly.   Acute delirium, with agitation- improved - RASS goal -1 to 0, PRN, morphine PRN, oxycodone 15mg > now q8h, qHS seroquel decreased on 2/11. Kloninpin to 1mg  tid to help improve alertness during the day> decrease TDD slowly.  Sepsis secondary to E faecalis bacteremia- secondary to PNA, has grown enterococcus  in sputum prior, previous vanc course completed 1/29. TEE on 2/7 without vegetations. PICC changed on 2/7. -Continue vanc through 2/21 per ID -urine culture NG -repeat blood cx 2/11 pending  Gastroparesis with some element ileus: improved but occasional vomiting spells with coughing attacks - Reglan back to 10mg  Q8h on 2/11. 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  - wound care following, appreciate dressing recommendations - surgical consult for L hand once more stable - keep A-line out of left arm   Hyperglycemia due to diabetes type II- controlled hyperglycemia.  -con't levemir 45 units BID, SSI prn -TF coverage to 10 units  Q4h with hold parameters  Acute urinary retention; no evidence of UTI based on UA Hematuria -failed condom cath trial, foley replaced 2/7; again on 2/11 -con't bethenacol (off since 1/30)  -increase mobility, decrease meds that could be precipitating this  Hypernatremia, resolved -con't to monitor -lasix and diamox once daily  Deconditioning -increasing therapy now that encephalopathy and sepsis improved -con't working with PT, OT, SLP   Daily Goals Checklist  Pain/Anxiety/Delirium protocol (if indicated): see above VAP protocol (if indicated):   DVT prophylaxis: bivalirudin GI prophylaxis: pantoprazole Glucose control: basal bolus insulin Mobility/therapy needs: Mobilization as tolerated, appreciate PT's help Code Status: Full code Disposition: ICU   This patient is critically ill with multiple organ system failure which requires frequent high complexity decision making, assessment, support, evaluation, and titration of therapies. This was completed through the application of advanced monitoring technologies and extensive interpretation of multiple databases. During this encounter critical care time was devoted to patient care services described in this note for 51 minutes.   2/30, DO 04/24/20 11:25 AM [facility]  From 7AM- 7PM if no response to pager, please call [phone]. After hours, 7PM- 7AM, please call Elink  [phone].

## 2020-04-24 NOTE — Progress Notes (Signed)
Pt stood at bedside with [facility] today.  Pt was able to hold his balance and rise from a seated postion to standing position using the [facility] for 30 seconds unassisted. Patient performed this action x 5 with minimal assistance.  Pt performed calf raises 2 sets x 5 reps while standing with the Stedy and mod balance assist.

## 2020-04-24 NOTE — Progress Notes (Signed)
Pharmacy Antibiotic Note  Alex [name] is a 50 y.o. male on ECMO. Pharmacy consulted to dose vancomycin for treatment of enterococcal bacteremia. TEE showed no vegetation. Treating nosocomial enterococcus faecalis bacteremia. 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 Vancomycin tentatively planned through 2/21. Vancomycin trough today is 15 mcg/ml however true trough is likely ~14 mcg/ml, Cr remains stable ~0.6. Repeat cultures 2/11 pending.  Plan: -Will increase vancomycin back to 1000mg  IV q12h -Follow Cr daily -Recheck VT early next week   Height: 5\' 4"  (162.6 cm) Weight: 68.1 kg (150 lb 2.1 oz) IBW/kg (Calculated) : 59.2  Temp (24hrs), Avg:98.8 F (37.1 C), Min:98.4 F (36.9 C), Max:99.1 F (37.3 C)  Recent Labs  Lab 04/20/20 0737 04/20/20 1530 04/22/20 0440 04/22/20 0452 04/22/20 1624 04/22/20 1700 04/23/20 0407 04/23/20 1551 04/24/20 0406 04/24/20 0417  WBC  --    < > 13.9*  --   --  17.7* 14.5* 18.1* 14.9*  --   CREATININE  --    < > 0.65  --   --  0.63 0.68 0.63 0.67  --   LATICACIDVEN  --    < >  --  1.3 0.7  --  1.1 0.5  --  1.1  VANCOTROUGH 20  --   --   --   --   --   --   --   --   --    < > = values in this interval not displayed.    Estimated Creatinine Clearance: 95.6 mL/min (by C-G formula based on SCr of 0.67 mg/dL).    No Known Allergies   Thank you for allowing pharmacy to be a part of this patient's care.  06/22/20, PharmD, BCPS, [facility] Clinical Pharmacist [phone] Please check AMION for all [facility] numbers 04/24/2020

## 2020-04-24 NOTE — Progress Notes (Signed)
Patient ID: Alex [name], male   DOB: 12/22/1970, 50 y.o.   MRN: [id number]     Advanced Heart Failure Rounding Note  PCP-Cardiologist: No primary care provider on file.   Subjective:    - 12/30: VV ECMO cannulation - 12/31: Left chest tube replaced - 1/2: Extubated. Echo with EF 60-65%, mildly dilated RV with mildly decreased systolic function.  - 1/4: Agitated, suspected aspiration.  Re-intubated.  - 1/5: ECMO cannula repositioned under TEE guidance. TEE showed moderately dilated/moderate-severely dysfunctional RV in setting of hypoxemia. LUE DVT found.  Patient got 1/2 dose of TPA due to initial concern for large PE.  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  - 1/6: Tracheostomy - 1/7: Echo with mild RV dilation/mild RV dysfunction.  - 1/16: Left chest tube out - 1/17: LUE arterial dopplers repeated, showed no obstruction.  - 1/20: Echo with EF 65-70%, mildly D-shaped septum, mildly dilated and mildly dysfunctional RV.  - 1/22: CTA chest: Bilateral upper lobe PEs (suspect chronic), changes c/w ARDS - 1/23: Bronchoscopy showed semi-occlusive ?mass/polyp in the trachea.  - 1/24: Bronchoscopy showed resolution of 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 - 1/26: ECMO circuit changed.  - 2/1: CT chest showed diffuse bronchiectasis as well as diffuse opacity consistent with COVID-19 PNA with ARDS. - 2/3: Trach exchange - 2/5: Decannualted to HFNC - 2/6: Enterococcal PNA/bacteremia.  Echo showed EF 65-70%, normal-appearing RV, no vegetation noted.  - 2/7: TEE with no vegetation, EF 55-60%, RV low normal function with normal size.   Up in chair x 4 hours yesterday. Able to stand   Sweep 9 ->  today, remains on HFNC 10L. Sitting in chair. Still with mild cough. Weaning sedation.    ECMO parameters: 3500 rpm Flow 4.8 L/min Pvenous -100 Delta P 33 Sweep 9 -> 8 HFNC 10 L  ABG 7.37/52/62/90% LDH  534 => 488 => 547 => 461 => => 507 => 475 => 501 PTT 63 Lactate 1.1 Hgb  8.8  Objective:   Weight Range: 68.1 kg Body mass index is 25.77 kg/m.   Vital Signs:   Temp:  [98.4 F (36.9 C)-99.1 F (37.3 C)] 99 F (37.2 C) (02/12 0400) Pulse Rate:  [75-120] 98 (02/12 0700) Resp:  [0-38] 13 (02/12 0700) BP: (96-163)/(67-91) 117/74 (02/12 0700) SpO2:  [91 %-100 %] 99 % (02/12 0700) Arterial Line BP: (108-193)/(57-83) 152/67 (02/12 0700) Weight:  [68.1 kg] 68.1 kg (02/12 0605) Last BM Date: 04/22/20  Weight change: Filed Weights   04/22/20 0500 04/23/20 0546 04/24/20 0605  Weight: 70.3 kg 68.1 kg 68.1 kg    Intake/Output:   Intake/Output Summary (Last 24 hours) at 04/24/2020 0740 Last data filed at 04/24/2020 0700 Gross per 24 hour  Intake 3552.79 ml  Output 3330 ml  Net 222.79 ml      Physical Exam    General:  Sitting in chair HEENT: normal Neck: RIJ ECMO cannula supple. no JVD. Carotids 2+ bilat; no bruits. No lymphadenopathy or thryomegaly appreciated. Cor: PMI nondisplaced. Regular rate & rhythm. No rubs, gallops or murmurs. Lungs: clear Abdomen: soft, nontender, nondistended. No hepatosplenomegaly. No bruits or masses. Good bowel sounds. Extremities: no cyanosis, clubbing, rash, edema  Left hand with dry gangrene Neuro: alert & orientedx3, cranial nerves grossly intact. moves all 4 extremities w/o difficulty. Affect pleasant   Telemetry   NSR 100-115s Personally reviewed   Labs    CBC Recent Labs    04/23/20 1551 04/23/20 1554 04/24/20 0406 04/24/20 0414  WBC 18.1*  --  14.9*  --   HGB 9.6*   < > 8.8* 9.2*  HCT 31.4*   < > 28.0* 27.0*  MCV 96.3  --  94.6  --   PLT 193  --  180  --    < > = values in this interval not displayed.   Basic Metabolic Panel Recent Labs    78/29/56 1551 04/23/20 1554 04/24/20 0406 04/24/20 0414  NA 145   < > 142 144  K 4.1   < > 4.0 4.0  CL 107  --  105  --   CO2 29  --  29  --   GLUCOSE 90  --  189*  --   BUN 62*  --  56*  --   CREATININE 0.63  --  0.67  --   CALCIUM 8.8*  --   8.8*  --    < > = values in this interval not displayed.   Liver Function Tests No results for input(s): AST, ALT, ALKPHOS, BILITOT, PROT, ALBUMIN in the last 72 hours. No results for input(s): LIPASE, AMYLASE in the last 72 hours. Cardiac Enzymes No results for input(s): CKTOTAL, CKMB, CKMBINDEX, TROPONINI in the last 72 hours.  BNP: BNP (last 3 results) No results for input(s): BNP in the last 8760 hours.  ProBNP (last 3 results) No results for input(s): PROBNP in the last 8760 hours.   D-Dimer No results for input(s): DDIMER in the last 72 hours. Hemoglobin A1C No results for input(s): HGBA1C in the last 72 hours. Fasting Lipid Panel No results for input(s): CHOL, HDL, LDLCALC, TRIG, CHOLHDL, LDLDIRECT in the last 72 hours. Thyroid Function Tests No results for input(s): TSH, T4TOTAL, T3FREE, THYROIDAB in the last 72 hours.  Invalid input(s): FREET3  Other results:   Imaging    No results found.   Medications:     Scheduled Medications: . sodium chloride   Intravenous Once  . sodium chloride   Intravenous Once  . acetaZOLAMIDE  250 mg Per Tube Daily  . bethanechol  10 mg Per Tube TID  . carvedilol  25 mg Per Tube BID WC  . chlorhexidine  15 mL Mouth Rinse BID  . Chlorhexidine Gluconate Cloth  6 each Topical Daily  . chlorpheniramine-HYDROcodone  5 mL Per Tube Q12H  . clonazePAM  1 mg Per Tube TID  . cloNIDine  0.1 mg Per Tube BID  . dextromethorphan  30 mg Per Tube TID  . docusate  100 mg Per Tube BID  . fiber  1 packet Per Tube BID  . free water  200 mL Per Tube Q4H  . furosemide  40 mg Intravenous Daily  . gabapentin  600 mg Per Tube Q8H  . insulin aspart  0-20 Units Subcutaneous Q4H  . insulin aspart  10 Units Subcutaneous Q4H  . insulin detemir  45 Units Subcutaneous BID  . ipratropium-albuterol  3 mL Nebulization TID  . lactobacillus acidophilus  2 tablet Per Tube TID  . liver oil-zinc oxide   Topical 5 X Daily  . mouth rinse  15 mL Mouth Rinse  q12n4p  . melatonin  5 mg Per Tube QHS  . metoCLOPramide (REGLAN) injection  10 mg Intravenous Q8H  . nutrition supplement (JUVEN)  1 packet Per Tube BID BM  . oxyCODONE  15 mg Per Tube Q6H  . pantoprazole sodium  40 mg Per Tube QHS  . QUEtiapine  75 mg Per Tube QHS  . sennosides  10 mL Per Tube QHS  .  sodium chloride flush  10-40 mL Intracatheter Q12H  . sodium chloride flush  10-40 mL Intracatheter Q12H    Infusions: . sodium chloride    . sodium chloride Stopped (04/19/20 1959)  . albumin human Stopped (04/23/20 0041)  . bivalirudin (ANGIOMAX) infusion 0.5 mg/mL (Non-ACS indications) 0.085 mg/kg/hr (04/24/20 0731)  . feeding supplement (PIVOT 1.5 CAL) 1,000 mL (04/23/20 0300)  . vancomycin      PRN Medications: Place/Maintain arterial line **AND** sodium chloride, acetaminophen (TYLENOL) oral liquid 160 mg/5 mL, albumin human, [DISCONTINUED] lidocaine **AND** albuterol, Gerhardt's butt cream, guaiFENesin, hydrALAZINE, labetalol, lip balm, ondansetron (ZOFRAN) IV, phenol, polyethylene glycol, promethazine-codeine, simethicone, sodium chloride flush, sodium chloride flush   Assessment/Plan   1. Acute hypoxemic respiratory failure: Due to COVID-19 PNA with bilateral infiltrates.  Refractory hypoxemia, VV-ECMO cannulation on 02/13/2020 with improvement in oxygenation.  Developed left PTX post-subclavian CVL and had left chest tube, the left lung is re-expanded and CT out.  He was extubated 1/2 but reintubated 1/4 with agitation and suspected aspiration.  Tracheostomy 1/6.  CTA chest 1/22 with suspected chronic PEs and ARDS. ECMO cannula repositioned 1/5. ECMO circuit changed 1/26. Extubated to HFNC on 2/5. LDH stable. Sweep at 9, have struggled with hypercarbia from significant dead space ventilation and had a set back with recurrent sepsis.  Restarted abx 2/5 with sepsis, Enterococcus faecalis, now on vancomycin.  I/Os negative with IV Lasix + acetazolamide, weight down 4 lbs. CXR worsened  with extubation and loss of PEEP, no change today.  - PaCO2 better, start slow sweep wean => decrease to 8 today and leave.   - On Lasix 40 mg IV with acetazolamide 250 once daily.   - On vancomycin with Enterococcus.  ID has seen - Patient has had remdesivir, tocilizumab. - Completed steroid taper. - Continue bivalirudin, goal PTT 65-80.  PTT 58  Discussed dosing with PharmD personally. - Continue to mobilize => work with PT, stand in bed.   - Coreg continues at 25 mg bid to increase fractional flow via ECMO circuit.  - Will need to consider for lung transplantation, think we need to start thinking along this line once Enterococcal infection treated and he is doing more PT. Will continue to wean sedation and work on ambulation 2. 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  Clot noted on TEE in RV as well.  TTE 1/2 showed normal EF 60-65%, RV improved (mildly dilated/dysfunctional). TEE on 1/5 with moderate to severe RV dysfunction but patient was hypoxemic.  Had 1/2 dose TPA on 1/5. Echo 1/20 with mildly dilated/mildly dysfunctional RV. CTA chest 1/22 with chronic-appearing PEs in upper lobes. - PTT 58. Bivalirudin for goal PTT 65-80.  Discussed dosing with PharmD personally. 3. Left PTX: Left chest tube, lung is re-expanded. Tube now out, stable CXR. 4. Shock: Suspect septic/distributive.  Now resolved, off NE.  5. Anemia: Hgb 8.8, transfuse < 8.  1 unit this morning.  6. AKI: Resolved 7. Hyperglycemia: insulin.  8. HTN: BP lower.     - Clonidine decreased to 0.1 bid and continue to wean down as able.  - Continue Coreg 25 mg bid.   - Suspect arterial line inaccurate, following cuff for now.  9. CHB: Episode of CHB when hypoxemic and with cough (suspect vagal).  NSR since then.   He has occasional short vagal episodes.  - Continue Coreg, watch rhythm.  10. Thrombocytopenia: Resolved  11. Ileus: Resolved. TFs restarted.  -  Getting  Reglan  - Cor-trak repositioned to post-pyloric placement 12. Ischemic digits: LUE.  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  Repeat study 1/17 showed no obstruction.  - Wound Care following. 13. ID: Group F Strep and Enterococcus faecalis in sputum. Initially completed abx for these bacteria. Now with recurrent sepsis, Enterococcus faecalis in blood now. Vancomycin restarted. TEE 2/7 with no vegetation.  - Continue vancomycin.   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   15. Hypernatremia: Na 142 Continue free water.  16. Hematuria: Resolved with foley change.   CRITICAL CARE Performed by: Arvilla Meresaniel Antawan Mchugh  Total critical care time: 35 minutes  Critical care time was exclusive of separately billable procedures and treating other patients.  Critical care was necessary to treat or prevent imminent or life-threatening deterioration.  Critical care was time spent personally by me on the following activities: development of treatment plan with patient and/or surrogate as well as nursing, discussions with consultants, evaluation of patient's response to treatment, examination of patient, obtaining history from patient or surrogate, ordering and performing treatments and interventions, ordering and review of laboratory studies, ordering and review of radiographic studies, pulse oximetry and re-evaluation of patient's condition.    Length of Stay: [id number]  Arvilla Meresaniel Holy [place], MD  04/24/2020, 7:40 AM  Advanced Heart Failure Team Pager [phone] (M-F; 7a - 4p)  Please contact [facility] for night-coverage after hours (4p -7a ) and weekends on [email or web address]

## 2020-04-24 NOTE — Progress Notes (Signed)
  Speech Language Pathology Treatment: Dysphagia;Cognitive-Linquistic  Patient Details Name: Alex [name] MRN: [id number] DOB: 06/09/1970 Today's Date: 04/24/2020 Time: 2671-2458 SLP Time Calculation (min) (ACUTE ONLY): 15 min  Assessment / Plan / Recommendation Clinical Impression  RN contacted SLP this am; pt alert, up in chair requesting something to drink as his goal for the day. Pt found to be fully alert, vocal quality clear, able to participate in conversation with RN and SLP about the Superbowl. SLP slowly transitioned pt from ice chips to small sips, to consecutive straw sips, several oz at once with no signs of aspiration. Vocal quality clear. Pt also consumed 3 oz of applesauce without difficulty. Pt does have the capacity for hard coughing and has been struggling with refractory cough. Pt did very well during assessment, though there is still concern for decreased laryngeal sensation given documented tracheal irritation. Recommend allowing pt ice chips, small sips of water with RN assist as long as he is alert and upright. If this is tolerated without any concerns RN could offer an Icee or some puree. SLP will f/u for tolerance and need for potential FEES Monday depending on progress.   HPI HPI: 50 year old man admitted to hospital 12/28 with 1 week history of dyspnea cough nausea and vomiting.  Initially admitted to [facility] long hospital and placed on high flow nasal cannula but rapidly failed and required intubation 12/29.  Persistent hypoxic respiratory failure with PF ratio 55 in spite of 18 of PEEP FiO2 0.1 despite paralytics.  Did not improve with prone ventilation. Cannulated for VV ECMO 12/30 via right IJ crescent cannula. Iatrogenic pneumothorax from left subclavian triple-lumen placement. Trach on 1/6. Decannulated 04/16/20      SLP Plan  Continue with current plan of care       Recommendations  Diet recommendations: Thin liquid Liquids provided via: Straw;Cup Medication  Administration: Via alternative means Supervision: Trained caregiver to feed patient Compensations: Slow rate;Small sips/bites Postural Changes and/or Swallow Maneuvers: Seated upright 90 degrees                Plan: Continue with current plan of care       GO               Alex Ditty, MA CCC-SLP  Acute Rehabilitation Services Pager [phone] Office [phone]  Alex [name] 04/24/2020, 9:47 AM

## 2020-04-24 NOTE — Progress Notes (Addendum)
ANTICOAGULATION CONSULT NOTE  Pharmacy Consult for bivalirudin Indication: ECMO and VTE  Labs: Recent Labs    04/22/20 0440 04/22/20 0930 04/23/20 0407 04/23/20 0411 04/23/20 1551 04/23/20 1554 04/23/20 2141 04/24/20 0406 04/24/20 0414  HGB 8.7*   < > 7.8*   < > 9.6*   < > 9.5* 8.8* 9.2*  HCT 28.8*   < > 26.0*   < > 31.4*   < > 28.0* 28.0* 27.0*  PLT 216   < > 184  --  193  --   --  180  --   APTT 61*   < > 63*  --  58*  --   --  58*  --   LABPROT 17.5*  --  17.6*  --   --   --   --  17.9*  --   INR 1.5*  --  1.5*  --   --   --   --  1.5*  --   CREATININE 0.65   < > 0.68  --  0.63  --   --  0.67  --    < > = values in this interval not displayed.    Assessment: 76 yoM admitted with COVID-19 PNA with worsening hypoxia, s/p cannulation for ECMO. Pt was started on IV heparin prior to cannulation due to acute DVTs and possible PE, transitioned to bivalirudin with ECMO. Now s/p tPA on 1/5 and tracheostomy on 1/6. Circuit last changed 1/26.  APTT slightly below goal at 58 seconds on bivalirudin 0.083 mg/kg/hr. H/H stable, fibrinogen and LDH stable.  Goal of Therapy:  aPTT 60-80 seconds   Plan:  -Increase bivalirudin to 0.085 mg/kg/hr (using order-specific wt 72.1kg) -Monitor q12h aPTT/CBC, LDH, and for s/sx of bleeding  Fredonia Highland, PharmD, BCPS, [facility] Clinical Pharmacist [phone] Please check AMION for all [facility] numbers 04/24/2020

## 2020-04-25 ENCOUNTER — Inpatient Hospital Stay (HOSPITAL_COMMUNITY): Payer: Medicaid Other

## 2020-04-25 LAB — POCT I-STAT 7, (LYTES, BLD GAS, ICA,H+H)
Acid-Base Excess: 5 mmol/L — ABNORMAL HIGH (ref 0.0–2.0)
Acid-Base Excess: 3 mmol/L — ABNORMAL HIGH (ref 0.0–2.0)
Acid-Base Excess: 2 mmol/L (ref 0.0–2.0)
Acid-Base Excess: 1 mmol/L (ref 0.0–2.0)
Acid-Base Excess: 2 mmol/L (ref 0.0–2.0)
Bicarbonate: 28.6 mmol/L — ABNORMAL HIGH (ref 20.0–28.0)
Bicarbonate: 28.7 mmol/L — ABNORMAL HIGH (ref 20.0–28.0)
Bicarbonate: 28 mmol/L (ref 20.0–28.0)
Bicarbonate: 28.3 mmol/L — ABNORMAL HIGH (ref 20.0–28.0)
Bicarbonate: 28.3 mmol/L — ABNORMAL HIGH (ref 20.0–28.0)
Calcium, Ion: 1.23 mmol/L (ref 1.15–1.40)
Calcium, Ion: 1.28 mmol/L (ref 1.15–1.40)
Calcium, Ion: 1.25 mmol/L (ref 1.15–1.40)
Calcium, Ion: 1.3 mmol/L (ref 1.15–1.40)
Calcium, Ion: 1.27 mmol/L (ref 1.15–1.40)
HCT: 22 % — ABNORMAL LOW (ref 39.0–52.0)
HCT: 24 % — ABNORMAL LOW (ref 39.0–52.0)
HCT: 25 % — ABNORMAL LOW (ref 39.0–52.0)
HCT: 28 % — ABNORMAL LOW (ref 39.0–52.0)
HCT: 25 % — ABNORMAL LOW (ref 39.0–52.0)
Hemoglobin: 7.5 g/dL — ABNORMAL LOW (ref 13.0–17.0)
Hemoglobin: 8.2 g/dL — ABNORMAL LOW (ref 13.0–17.0)
Hemoglobin: 8.5 g/dL — ABNORMAL LOW (ref 13.0–17.0)
Hemoglobin: 9.5 g/dL — ABNORMAL LOW (ref 13.0–17.0)
Hemoglobin: 8.5 g/dL — ABNORMAL LOW (ref 13.0–17.0)
O2 Saturation: 99 %
O2 Saturation: 94 %
O2 Saturation: 97 %
O2 Saturation: 94 %
O2 Saturation: 96 %
Patient temperature: 36.2
Patient temperature: 36.6
Patient temperature: 36.9
Patient temperature: 36.9
Patient temperature: 36.6
Potassium: 3.6 mmol/L (ref 3.5–5.1)
Potassium: 3.8 mmol/L (ref 3.5–5.1)
Potassium: 4.2 mmol/L (ref 3.5–5.1)
Potassium: 3.9 mmol/L (ref 3.5–5.1)
Potassium: 3.8 mmol/L (ref 3.5–5.1)
Sodium: 143 mmol/L (ref 135–145)
Sodium: 142 mmol/L (ref 135–145)
Sodium: 142 mmol/L (ref 135–145)
Sodium: 142 mmol/L (ref 135–145)
Sodium: 141 mmol/L (ref 135–145)
TCO2: 30 mmol/L (ref 22–32)
TCO2: 30 mmol/L (ref 22–32)
TCO2: 30 mmol/L (ref 22–32)
TCO2: 30 mmol/L (ref 22–32)
TCO2: 30 mmol/L (ref 22–32)
pCO2 arterial: 35.1 mmHg (ref 32.0–48.0)
pCO2 arterial: 47.8 mmHg (ref 32.0–48.0)
pCO2 arterial: 50.6 mmHg — ABNORMAL HIGH (ref 32.0–48.0)
pCO2 arterial: 56.3 mmHg — ABNORMAL HIGH (ref 32.0–48.0)
pCO2 arterial: 49.5 mmHg — ABNORMAL HIGH (ref 32.0–48.0)
pH, Arterial: 7.517 — ABNORMAL HIGH (ref 7.350–7.450)
pH, Arterial: 7.385 (ref 7.350–7.450)
pH, Arterial: 7.351 (ref 7.350–7.450)
pH, Arterial: 7.309 — ABNORMAL LOW (ref 7.350–7.450)
pH, Arterial: 7.363 (ref 7.350–7.450)
pO2, Arterial: 132 mmHg — ABNORMAL HIGH (ref 83.0–108.0)
pO2, Arterial: 70 mmHg — ABNORMAL LOW (ref 83.0–108.0)
pO2, Arterial: 92 mmHg (ref 83.0–108.0)
pO2, Arterial: 79 mmHg — ABNORMAL LOW (ref 83.0–108.0)
pO2, Arterial: 84 mmHg (ref 83.0–108.0)

## 2020-04-25 LAB — BASIC METABOLIC PANEL
Anion gap: 10 (ref 5–15)
Anion gap: 9 (ref 5–15)
BUN: 54 mg/dL — ABNORMAL HIGH (ref 6–20)
BUN: 60 mg/dL — ABNORMAL HIGH (ref 6–20)
CO2: 26 mmol/L (ref 22–32)
CO2: 28 mmol/L (ref 22–32)
Calcium: 8.6 mg/dL — ABNORMAL LOW (ref 8.9–10.3)
Calcium: 8.7 mg/dL — ABNORMAL LOW (ref 8.9–10.3)
Chloride: 106 mmol/L (ref 98–111)
Chloride: 105 mmol/L (ref 98–111)
Creatinine, Ser: 0.73 mg/dL (ref 0.61–1.24)
Creatinine, Ser: 0.77 mg/dL (ref 0.61–1.24)
GFR, Estimated: >60 mL/min (ref >60)
GFR, Estimated: >60 mL/min (ref >60)
Glucose, Bld: 147 mg/dL — ABNORMAL HIGH (ref 70–99)
Glucose, Bld: 175 mg/dL — ABNORMAL HIGH (ref 70–99)
Potassium: 3.8 mmol/L (ref 3.5–5.1)
Potassium: 4.1 mmol/L (ref 3.5–5.1)
Sodium: 142 mmol/L (ref 135–145)
Sodium: 142 mmol/L (ref 135–145)

## 2020-04-25 LAB — CBC
HCT: 25.6 % — ABNORMAL LOW (ref 39.0–52.0)
HCT: 27.5 % — ABNORMAL LOW (ref 39.0–52.0)
Hemoglobin: 8.5 g/dL — ABNORMAL LOW (ref 13.0–17.0)
Hemoglobin: 8.4 g/dL — ABNORMAL LOW (ref 13.0–17.0)
MCH: 30.9 pg (ref 26.0–34.0)
MCH: 29.7 pg (ref 26.0–34.0)
MCHC: 33.2 g/dL (ref 30.0–36.0)
MCHC: 30.5 g/dL (ref 30.0–36.0)
MCV: 93.1 fL (ref 80.0–100.0)
MCV: 97.2 fL (ref 80.0–100.0)
Platelets: 184 10*3/uL (ref 150–400)
Platelets: 189 10*3/uL (ref 150–400)
RBC: 2.75 MIL/uL — ABNORMAL LOW (ref 4.22–5.81)
RBC: 2.83 MIL/uL — ABNORMAL LOW (ref 4.22–5.81)
RDW: 18.6 % — ABNORMAL HIGH (ref 11.5–15.5)
RDW: 18.9 % — ABNORMAL HIGH (ref 11.5–15.5)
WBC: 11.1 10*3/uL — ABNORMAL HIGH (ref 4.0–10.5)
WBC: 12.9 10*3/uL — ABNORMAL HIGH (ref 4.0–10.5)
nRBC: 0.8 % — ABNORMAL HIGH (ref 0.0–0.2)
nRBC: 0.6 % — ABNORMAL HIGH (ref 0.0–0.2)

## 2020-04-25 LAB — LACTIC ACID, PLASMA
Lactic Acid, Venous: 1 mmol/L (ref 0.5–1.9)
Lactic Acid, Venous: 0.5 mmol/L (ref 0.5–1.9)

## 2020-04-25 LAB — GLUCOSE, CAPILLARY
Glucose-Capillary: 107 mg/dL — ABNORMAL HIGH (ref 70–99)
Glucose-Capillary: 124 mg/dL — ABNORMAL HIGH (ref 70–99)
Glucose-Capillary: 134 mg/dL — ABNORMAL HIGH (ref 70–99)
Glucose-Capillary: 147 mg/dL — ABNORMAL HIGH (ref 70–99)
Glucose-Capillary: 148 mg/dL — ABNORMAL HIGH (ref 70–99)
Glucose-Capillary: 36 mg/dL — CL (ref 70–99)
Glucose-Capillary: 76 mg/dL (ref 70–99)
Glucose-Capillary: 80 mg/dL (ref 70–99)
Glucose-Capillary: 90 mg/dL (ref 70–99)

## 2020-04-25 LAB — PROTIME-INR
INR: 1.5 — ABNORMAL HIGH (ref 0.8–1.2)
Prothrombin Time: 17.8 seconds — ABNORMAL HIGH (ref 11.4–15.2)

## 2020-04-25 LAB — APTT
aPTT: 63 seconds — ABNORMAL HIGH (ref 24–36)
aPTT: 62 seconds — ABNORMAL HIGH (ref 24–36)

## 2020-04-25 LAB — FIBRINOGEN: Fibrinogen: 305 mg/dL (ref 210–475)

## 2020-04-25 LAB — LACTATE DEHYDROGENASE: LDH: 478 U/L — ABNORMAL HIGH (ref 98–192)

## 2020-04-25 MED ORDER — IPRATROPIUM-ALBUTEROL 0.5-2.5 (3) MG/3ML IN SOLN
3.0000 mL | Freq: Two times a day (BID) | RESPIRATORY_TRACT | Status: DC
  Administered 2020-04-25 – 2020-04-26 (×2): 3 mL via RESPIRATORY_TRACT
  Filled 2020-04-25: qty 3

## 2020-04-25 MED ORDER — DEXTROSE 10 % IV SOLN: INTRAVENOUS | Status: DC

## 2020-04-25 MED ORDER — CLONAZEPAM 0.5 MG PO TABS
0.5000 mg | ORAL_TABLET | Freq: Every day | ORAL | Status: DC
  Administered 2020-04-26 – 2020-04-27 (×2): 0.5 mg
  Filled 2020-04-25 (×3): qty 1

## 2020-04-25 MED ORDER — INSULIN DETEMIR 100 UNIT/ML ~~LOC~~ SOLN
40.0000 [IU] | Freq: Two times a day (BID) | SUBCUTANEOUS | Status: DC
  Administered 2020-04-25: 40 [IU] via SUBCUTANEOUS
  Filled 2020-04-25 (×5): qty 0.4

## 2020-04-25 MED ORDER — FREE WATER
50.0000 mL | Status: DC
  Administered 2020-04-25 – 2020-04-27 (×9): 50 mL

## 2020-04-25 MED ORDER — POTASSIUM CHLORIDE 20 MEQ PO PACK
40.0000 meq | PACK | Freq: Once | ORAL | Status: AC
  Administered 2020-04-25: 40 meq
  Filled 2020-04-25: qty 2

## 2020-04-25 MED ORDER — DEXTROSE 50 % IV SOLN
INTRAVENOUS | Status: AC
  Administered 2020-04-25: 50 mL
  Filled 2020-04-25: qty 50

## 2020-04-25 MED ORDER — CLONAZEPAM 1 MG PO TABS
1.0000 mg | ORAL_TABLET | Freq: Two times a day (BID) | ORAL | Status: DC
  Administered 2020-04-26 – 2020-05-01 (×11): 1 mg
  Filled 2020-04-25 (×11): qty 1

## 2020-04-25 MED ORDER — PANCRELIPASE (LIP-PROT-AMYL) 10440-39150 UNITS PO TABS
20880.0000 [IU] | ORAL_TABLET | Freq: Once | ORAL | Status: AC
  Administered 2020-04-25: 20880 [IU]
  Filled 2020-04-25: qty 2

## 2020-04-25 MED ORDER — SODIUM BICARBONATE 650 MG PO TABS
650.0000 mg | ORAL_TABLET | Freq: Once | ORAL | Status: AC
  Administered 2020-04-25: 650 mg
  Filled 2020-04-25: qty 1

## 2020-04-25 NOTE — Progress Notes (Signed)
ECMO PROGRESS NOTE  NAME:  Alex [name], MRN:  [id number], DOB:  27-Jan-1971, LOS: 47 ADMISSION DATE:  02/19/2020, CONSULTATION DATE: 2020/03/19 REFERRING MD: Wynona Neat -LBPCCM, CHIEF COMPLAINT: Respiratory failure requiring ECMO  HPI/course in hospital  50 year old man admitted to hospital 12/28 with 1 week history of dyspnea cough nausea and vomiting.  Initially admitted to [facility] and placed on high flow nasal cannula but rapidly failed and required intubation 12/29.  Persistent hypoxic respiratory failure with PF ratio 55 in spite of 18 of PEEP FiO2 0.1 despite paralytics.  Did not improve with prone ventilation  Cannulated for VV ECMO 12/30 via right IJ crescent cannula.  ECMO circuit was changed on 04/07/2020 Iatrogenic pneumothorax from left subclavian triple-lumen placement  12/28 admitted covid, ARDS 12/30 ECMO cannulation, iatrogenic pneumothrorax, DVT, Bivalrudin started           Left subclavian hematoma, chest tube, ceftriaxone/ azithromycin started 12/31 1Unit PRBC 1/1 Palliative consult, noted HTN, fentanyl only 1/2 Extubation I/O positive, left lung re-expanded, EF 60-65%, Lasix 20mg  BID 1/3 BiPAP in place, HTN to 190s, 200s, labetolol for BP, I/O even, cefepime and vancomycin started 1/4 agitation off BiPAP, possible aspiration 1/5 agitation overnight, reintubated      ECHO with RV dilation, ECMO cannula repositioned, LUE DVT (+), 1/2 dose tPA given,      I/O up, on lasix, Epi, NE, vasopressin started, HR/ rhythm issues noted 1/6 Tracheostomy, hypoglycemic when off tube feeds       Cortrack tune issues 1/7 on lasix gtt 4mg /hr with diuresis, agitation improved 1/8 starting steroid taper 1/9 severe agitation, heavy sedation required, lasix gtt 6mg /hr, I/Os (-)       precedex and fentanyl 1/10 Vanc stopped 1/11 sweep to 2, lasix gtt at 4mg /hr, weight up, metoprolol added for HTN 1/12 fevers to 99 noted, lasix gtt and metazolone 1/13 lasix gtt dced 1/14  intermittent HTN, possibly flash pulmonary edema tied to sedation        Ischemic left hand changes, 1 Unit PRBCs 1/15 1/16 sweep from 4 to 2.5, chest tube removed 1/17 sweep at 6, trial of nebulized morphine for cough, LUE dopplers show no obstruction 1/18 two events of 2nd degree type II HB noted with coughing         sweep at 4, IV lasix 40, acetazolamide, distal XLT trach placement         agitation and BP spikes 1/19 agitation, air hunger, seroquel, klonopin, oxy, valproate, ketamine trial 1/20 sweep at 3.5, diuresis results in chugging, albumin given         1 Unit PRBCs, lidocaine nebulizers for cough, ancef started 1/21 sweep to 8, anxiety, I/Os (+) with lasix and acetazolamide 1/22 sweep at 5, chest CT bilateral upper lobe PEs 1/23 sweep at 5, delirium, I/Os even with lasix and acetazolamide         continued coughing, 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 1/24 sweep at 7.5, 1 Unit PRBCs, repeat bronchoscopy showed resolution of 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 1/25 sweep at 7.5, on propofol, lasix gtt at 3ml/hr, I/Os (+)         nebulized morphine and lidocaine for cough 1/26 sweep at 8, propofol/ precedex, cough present when weaned, lasix gtt at 28ml/hr        ECMO circuit changes, levophed started, 1 Unit PRBCs 1/27 sweep at 4, off sedation, delirium, I/Os even        lasix gtt restated, acetazolamide overnight 1/28 sweep at 7.5, HTN  labile 1/29 sweep of 6, patient reports a tickle in throat, cetacaine 1/30 sweep down to 6, cough improved with cetacaine and gabapentin 1/31 sweep at 5, agitation, off acetazolamide, lasix, weight up 2/1 cough, of pulmicort, cetacaine for cough, continued gabapentin, dexmethorphan       CT demonstrated trach placement against back of trachea 2/2 Trach collar trial somewhat effective in managing cough       low dose diltiazem for HR,  2/3 sweep at 4,trach cannula replacement (longer, more flexible bivona); brochoscopy shows  irritated mucosa       IV lasix and acetazolamide 2/4 sweep at 5, I/Os (+), BUN elevated, lasix gtt with diamox,       trach decannulation, increased gabapentin for cough 2/5 start HCAP coverage for rising WBC/temps, check Pct/cultures, CXR worse with lack of PEEP, diuresed well but started getting very hypotensive, sepsis vs. Too dry 04/18/20 blood cx grew E faecalis 2/7 PICC change, TEE  Past Medical History  none  Interim history/subjective:  Clears per SLP- drinking water, wants to advance more. Still occasional nausea- work him up overnight once. Stood several times yesterday, had some balance issues.  Objective   Blood pressure (!) 135/92, pulse (!) 107, temperature 98.6 F (37 C), temperature source Oral, resp. rate 20, height 5\' 4"  (1.626 m), weight 71 kg, SpO2 100 %.        Intake/Output Summary (Last 24 hours) at 04/25/2020 0721 Last data filed at 04/25/2020 0701 Gross per 24 hour  Intake 2574.28 ml  Output 2490 ml  Net 84.28 ml   Filed Weights   04/23/20 0546 04/24/20 0605 04/25/20 0500  Weight: 68.1 kg 68.1 kg 71 kg    Examination: Constitutional: ill-appearing man sitting up in bed in NAD HEENT: [place]/AT, eyes anicteric Neck: Trach site bandage.  Right IJ ECMO cannula, some bleeding under the bandage. Cardiovascular: Tachycardic, regular rhythm Respiratory: Voice slowly getting stronger, speaking in phrases several words long.  Minimal breath sounds bilaterally.  On 5 L nasal cannula.  Intermittent dry cough. Gastrointestinal: Soft, nontender, nondistended Skin: Right radial A-line with good distal perfusion.  Digit necrosis on the left, some skin sloughing-bandaged Neurologic: Awake and alert, asking questions and answering questions appropriately.  Moving all extremities spontaneously. psychiatric: Calm, cooperative with exam.   Net  +160cc , net +1L for admission CXR> improving bilateral opacities, R>L. Still has LLL cavity.  Assessment & Plan:  Acute  hypoxemic/hypercapnic respiratory failure due to severe ARDS from COVID-19 pneumonia Probable acute PE, and RV dysfunction s/p TPA Refractory coughing- improved after trach removal but still an issue Strep group F/ enterococcal pneumonia- s/p 10 days vanc 1/29; recurrent with bacteremia on 2/5. - Continue VV ECMO support, wean sweep. - Current cough regimen:  dextromethorphan, plus ongoing opiates and benzos to control WOB. Decrease gabapentin due to questionable efficacy and sedation.  Slowly decreasing opiates--decreased to every 8 hours on 2/12.  Decreasing benzos slowly-  1 dose adjustment per week-next dose adjustment on 2/14 -Continue supplemental oxygen via nasal cannula, can decrease as his oxygen improves. -IS, flutter, OOB mobility    HTN, now hypotensive overnight.  Trials of reducing HR/BP have resulted in reduced pCO2 (likely related to fractional flow through ECMO circuit) -Continue coreg. No longer on diltiazem due to hypotension. -Decreased clonidine to BID this week- taper slowly.   Acute delirium, with agitation- improved. -RASS goal -1 to 0, PRN, morphine PRN, oxycodone 15mg > now q8h, qHS seroquel decreased on 2/11. Klononpin to 1mg  tid to help  improve alertness during the day> decrease TDD slowly.   Sepsis secondary to E faecalis bacteremia- secondary to PNA, has grown enterococcus in sputum prior, previous vanc course completed 1/29. TEE on 2/7 without vegetations. PICC changed on 2/7. -Continue vanc through 2/21 per ID -repeat blood cx 2/11 pending  Gastroparesis with some element ileus: improved but occasional vomiting spells with coughing attacks - Reglan back to 10mg  Q8h on 2/11. 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 -decrease levemir 40 units BID, SSI prn -TF coverage to 10 units Q4h with hold parameters -goal BG 140-180  Acute urinary retention; no evidence of UTI based on UA Hematuria -failed condom cath trial, foley replaced 2/7; again on 2/11 -con't bethenacol (off since 1/30)  -increase mobility, decrease meds that could be precipitating this -when able to take pills can try tamsulosin  Hypernatremia, resolved -con't to monitor -lasix and diamox once daily  Deconditioning -increasing therapy now that encephalopathy and sepsis improved -con't working with PT, OT, SLP   Daily Goals Checklist  Pain/Anxiety/Delirium protocol (if indicated): see above VAP protocol (if indicated):   DVT prophylaxis: bivalirudin GI prophylaxis: pantoprazole Glucose control: basal bolus insulin Mobility/therapy needs: Mobilization as tolerated Code Status: Full code Disposition: ICU   This patient is critically ill with multiple organ system failure which requires frequent high complexity decision making, assessment, support, evaluation, and titration of therapies. This was completed through the application of advanced monitoring technologies and extensive interpretation of multiple databases. During this encounter critical care time was devoted to patient care services described in this note for 45 minutes.   2/30, DO 04/25/20 8:01 AM  Pulmonary & Critical Care  From 7AM- 7PM if no response to pager, please call [phone]. After hours, 7PM- 7AM, please call Elink  [phone].

## 2020-04-25 NOTE — Progress Notes (Signed)
Overnight Nursing Note  Pt AOX4, pleasant and cooperative. Denies SOB/CP overnight.   Lungs clear/dim in bases.   SR/ST 90-100. BP within patient set parameters.   Bowels active, BM X1 overnight. TF infusing via pump, PIVOT 1.5 @ 70 ml/hr, 200cc free water q 4hr. Pt able to tolerate part of free water orally. Okay to give patient sips of water and ice chips per Speech Therapy. Pt tolerated sips and chips with no N/V overnight.   Dressing changed on left fingers per order. Trach site dressing changed.  Pt transferred to chair @ 0630 with STEDY and nurse assist x3. Pt tolerated transfer well.   Pt states "I think I had a good night."   Lines: LEFT upper arm PICC- triple lumen Right radial art line, wave form appropriate. Foley cath  Drips: Angiomax @ .085 mg/kg/hr  In: PO- 430 ml Free water Tube- 200 ml IV- TF- TOTAL- 2850 ml  OUT: FOLEY-1065 ml  Fluid Balance: 12 hour + 24 hour + 

## 2020-04-25 NOTE — Progress Notes (Signed)
Patient ID: Alex [name], male   DOB: 09-12-1970, 50 y.o.   MRN: [id number]     Advanced Heart Failure Rounding Note  PCP-Cardiologist: No primary care provider on file.   Subjective:    - 12/30: VV ECMO cannulation - 12/31: Left chest tube replaced - 1/2: Extubated. Echo with EF 60-65%, mildly dilated RV with mildly decreased systolic function.  - 1/4: Agitated, suspected aspiration.  Re-intubated.  - 1/5: ECMO cannula repositioned under TEE guidance. TEE showed moderately dilated/moderate-severely dysfunctional RV in setting of hypoxemia. LUE DVT found.  Patient got 1/2 dose of TPA due to initial concern for large PE.  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  - 1/6: Tracheostomy - 1/7: Echo with mild RV dilation/mild RV dysfunction.  - 1/16: Left chest tube out - 1/17: LUE arterial dopplers repeated, showed no obstruction.  - 1/20: Echo with EF 65-70%, mildly D-shaped septum, mildly dilated and mildly dysfunctional RV.  - 1/22: CTA chest: Bilateral upper lobe PEs (suspect chronic), changes c/w ARDS - 1/23: Bronchoscopy showed semi-occlusive ?mass/polyp in the trachea.  - 1/24: Bronchoscopy showed resolution of 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 - 1/26: ECMO circuit changed.  - 2/1: CT chest showed diffuse bronchiectasis as well as diffuse opacity consistent with COVID-19 PNA with ARDS. - 2/3: Trach exchange - 2/5: Decannualted to HFNC - 2/6: Enterococcal PNA/bacteremia.  Echo showed EF 65-70%, normal-appearing RV, no vegetation noted.  - 2/7: TEE with no vegetation, EF 55-60%, RV low normal function with normal size.   Able to stand several times yesterday. Still having some nausea.   Sweep 8 -> 7   ECMO parameters: 3500 rpm Flow 4.7 L/min Pvenous -88 Delta P 31 Sweep 8 -> 7 HFNC 10 L  ABG 7.38/48/70/94% LDH  534 => 488 => 547 => 461 => => 507 => 475 => 501 => 478 PTT 63 Lactate 1.0 Hgb 8.5  Objective:   Weight Range: 71 kg Body mass index is 26.87 kg/m.    Vital Signs:   Temp:  [98.3 F (36.8 C)-98.9 F (37.2 C)] 98.6 F (37 C) (02/13 0400) Pulse Rate:  [83-111] 107 (02/13 0700) Resp:  [6-25] 23 (02/13 0800) BP: (88-136)/(58-96) 136/90 (02/13 0800) SpO2:  [93 %-100 %] 100 % (02/13 0816) Arterial Line BP: (97-178)/(51-80) 151/71 (02/13 0800) Weight:  [71 kg] 71 kg (02/13 0500) Last BM Date: 04/25/20  Weight change: Filed Weights   04/23/20 0546 04/24/20 0605 04/25/20 0500  Weight: 68.1 kg 68.1 kg 71 kg    Intake/Output:   Intake/Output Summary (Last 24 hours) at 04/25/2020 0938 Last data filed at 04/25/2020 0800 Gross per 24 hour  Intake 2823.89 ml  Output 2490 ml  Net 333.89 ml      Physical Exam    General:  Sitting in chair HEENT: normal Neck: supple. RIJ ECMO cannula. Carotids 2+ bilat; no bruits. No lymphadenopathy or thryomegaly appreciated. Cor: PMI nondisplaced. Regular tachy  Lungs: coarse Abdomen: soft, nontender, nondistended. No hepatosplenomegaly. No bruits or masses. Good bowel sounds. Extremities: no cyanosis, clubbing, rash, edema L hand dry gangrene Neuro: alert & orientedx3, cranial nerves grossly intact. moves all 4 extremities w/o difficulty. Affect pleasant   Telemetry   NSR 100-110 Personally reviewed   Labs    CBC Recent Labs    04/24/20 1640 04/24/20 1648 04/25/20 0403 04/25/20 0413  WBC 14.5*  --  11.1*  --   HGB 8.9*   < > 8.5* 8.2*  HCT 28.3*   < > 25.6*  24.0*  MCV 94.6  --  93.1  --   PLT 182  --  184  --    < > = values in this interval not displayed.   Basic Metabolic Panel Recent Labs    67/89/38 1640 04/24/20 1648 04/25/20 0403 04/25/20 0413  NA 143   < > 142 142  K 3.6   < > 3.8 3.8  CL 105  --  106  --   CO2 27  --  26  --   GLUCOSE 57*  --  147*  --   BUN 58*  --  54*  --   CREATININE 0.62  --  0.73  --   CALCIUM 8.8*  --  8.6*  --    < > = values in this interval not displayed.   Liver Function Tests No results for input(s): AST, ALT, ALKPHOS, BILITOT,  PROT, ALBUMIN in the last 72 hours. No results for input(s): LIPASE, AMYLASE in the last 72 hours. Cardiac Enzymes No results for input(s): CKTOTAL, CKMB, CKMBINDEX, TROPONINI in the last 72 hours.  BNP: BNP (last 3 results) No results for input(s): BNP in the last 8760 hours.  ProBNP (last 3 results) No results for input(s): PROBNP in the last 8760 hours.   D-Dimer No results for input(s): DDIMER in the last 72 hours. Hemoglobin A1C No results for input(s): HGBA1C in the last 72 hours. Fasting Lipid Panel No results for input(s): CHOL, HDL, LDLCALC, TRIG, CHOLHDL, LDLDIRECT in the last 72 hours. Thyroid Function Tests No results for input(s): TSH, T4TOTAL, T3FREE, THYROIDAB in the last 72 hours.  Invalid input(s): FREET3  Other results:   Imaging    DG CHEST PORT 1 VIEW  Result Date: 04/25/2020 CLINICAL DATA:  ECMO therapy.  Airspace consolidation EXAM: PORTABLE CHEST 1 VIEW COMPARISON:  April 24, 2020 FINDINGS: ECMO catheter tip is in the inferior vena cava. Enteric tube tip is below the diaphragm. Peripherally inserted central catheter tip in superior vena cava. No pneumothorax. Diffuse opacification throughout the lungs bilaterally noted with air bronchograms in the lower lobe regions. Heart is upper normal in size with pulmonary vascularity grossly within normal limits. No adenopathy. No bone lesions. IMPRESSION: Tube and catheter positions as described without evident pneumothorax. Widespread airspace opacity bilaterally with consolidation in both lower lobes. Air bronchograms noted in both lower lobes. Appearance of the lungs is essentially stable compared to the prior study. Question widespread pulmonary edema versus pneumonia. A degree of ARDS is also possible. More than one of these entities may be present concurrently. Stable cardiac silhouette. Electronically Signed   By: Bretta Bang III M.D.   On: 04/25/2020 08:45     Medications:     Scheduled  Medications: . sodium chloride   Intravenous Once  . sodium chloride   Intravenous Once  . acetaZOLAMIDE  250 mg Per Tube Daily  . bethanechol  10 mg Per Tube TID  . carvedilol  25 mg Per Tube BID WC  . chlorhexidine  15 mL Mouth Rinse BID  . Chlorhexidine Gluconate Cloth  6 each Topical Daily  . chlorpheniramine-HYDROcodone  5 mL Per Tube Q12H  . [START ON 04/26/2020] clonazePAM  0.5 mg Per Tube Daily  . clonazePAM  1 mg Per Tube TID  . [START ON 04/26/2020] clonazePAM  1 mg Per Tube BID  . cloNIDine  0.1 mg Per Tube BID  . dextromethorphan  30 mg Per Tube TID  . docusate  100 mg Per Tube BID  .  fiber  1 packet Per Tube BID  . free water  50 mL Per Tube Q4H  . furosemide  40 mg Intravenous Daily  . gabapentin  600 mg Per Tube Q8H  . insulin aspart  0-20 Units Subcutaneous Q4H  . insulin aspart  10 Units Subcutaneous Q4H  . insulin detemir  40 Units Subcutaneous BID  . ipratropium-albuterol  3 mL Nebulization TID  . lactobacillus acidophilus  2 tablet Per Tube TID  . liver oil-zinc oxide   Topical 5 X Daily  . mouth rinse  15 mL Mouth Rinse q12n4p  . melatonin  5 mg Per Tube QHS  . metoCLOPramide (REGLAN) injection  10 mg Intravenous Q8H  . nutrition supplement (JUVEN)  1 packet Per Tube BID BM  . oxyCODONE  15 mg Per Tube Q8H  . pantoprazole sodium  40 mg Per Tube QHS  . QUEtiapine  75 mg Per Tube QHS  . sennosides  10 mL Per Tube QHS  . sodium chloride flush  10-40 mL Intracatheter Q12H  . sodium chloride flush  10-40 mL Intracatheter Q12H    Infusions: . sodium chloride    . sodium chloride Stopped (04/19/20 1959)  . albumin human Stopped (04/23/20 0041)  . bivalirudin (ANGIOMAX) infusion 0.5 mg/mL (Non-ACS indications) 0.085 mg/kg/hr (04/25/20 0800)  . feeding supplement (PIVOT 1.5 CAL) 1,000 mL (04/23/20 0300)  . vancomycin 200 mL/hr at 04/25/20 0800    PRN Medications: Place/Maintain arterial line **AND** sodium chloride, acetaminophen (TYLENOL) oral liquid 160 mg/5  mL, albumin human, [DISCONTINUED] lidocaine **AND** albuterol, Gerhardt's butt cream, guaiFENesin, hydrALAZINE, labetalol, lip balm, ondansetron (ZOFRAN) IV, phenol, polyethylene glycol, promethazine-codeine, simethicone, sodium chloride flush, sodium chloride flush   Assessment/Plan   1. Acute hypoxemic respiratory failure: Due to COVID-19 PNA with bilateral infiltrates.  Refractory hypoxemia, VV-ECMO cannulation on 03/08/2020 with improvement in oxygenation.  Developed left PTX post-subclavian CVL and had left chest tube, the left lung is re-expanded and CT out.  He was extubated 1/2 but reintubated 1/4 with agitation and suspected aspiration.  Tracheostomy 1/6.  CTA chest 1/22 with suspected chronic PEs and ARDS. ECMO cannula repositioned 1/5. ECMO circuit changed 1/26. Extubated to HFNC on 2/5. LDH stable. Sweep at 9, have struggled with hypercarbia from significant dead space ventilation and had a set back with recurrent sepsis.  Restarted abx 2/5 with sepsis, Enterococcus faecalis, now on vancomycin.  I/Os negative with IV Lasix + acetazolamide, weight down 4 lbs. CXR worsened with extubation and loss of PEEP, no change today.  - PaCO2 better, start slow sweep wean => decreased to 7 today - On Lasix 40 mg IV with acetazolamide 250 once daily.  Weight up today but looks euvolemic - On vancomycin with Enterococcus.  ID has seen - Patient has had remdesivir, tocilizumab. - Completed steroid taper. - Continue bivalirudin, goal PTT 65-80.  PTT 63 Discussed dosing with PharmD personally. - Continue to mobilize => work with PT, stand in bed.   - Coreg continues at 25 mg bid to increase fractional flow via ECMO circuit.  - Will need to consider for lung transplantation, think we need to start thinking along this line once Enterococcal infection treated and he is doing more PT. Will continue to wean sedation and work on ambulation. Antibodies from blood transfusion may be an issue. Limit transfusion going  forward as possible,  2. 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  Clot noted on TEE in RV as well.  TTE 1/2 showed normal EF 60-65%, RV improved (mildly dilated/dysfunctional). TEE on 1/5 with moderate to severe RV dysfunction but patient was hypoxemic.  Had 1/2 dose TPA on 1/5. Echo 1/20 with mildly dilated/mildly dysfunctional RV. CTA chest 1/22 with chronic-appearing PEs in upper lobes. - PTT 63. Bivalirudin for goal PTT 65-80. Discussed dosing with PharmD personally. 3. Left PTX: Left chest tube, lung is re-expanded. Tube now out, stable CXR. 4. Shock: Suspect septic/distributive.  Now resolved, off NE.  5. Anemia: Hgb 8.5, transfuse < 7.5. Limit transfusion with transplant possibly on the horizon 6. AKI: Resolved 7. Hyperglycemia: insulin.  8. HTN: BP lower.     - Clonidine decreased to 0.1 bid and continue to wean down as able. No change today - Continue Coreg 25 mg bid.   - Suspect arterial line inaccurate, following cuff for now.  9. CHB: Episode of CHB when hypoxemic and with cough (suspect vagal).  NSR since then.   He has occasional short vagal episodes.  - Continue Coreg, watch rhythm.  10. Thrombocytopenia: Resolved  11. Ileus: Resolved. TFs restarted.  - Getting Reglan  - Cor-trak repositioned to post-pyloric placement 12. Ischemic digits: LUE.  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  Repeat study 1/17 showed no obstruction.  - Wound Care following. 13. ID: Group F Strep and Enterococcus faecalis in sputum. Initially completed abx for these bacteria. Now with recurrent sepsis, Enterococcus faecalis in blood now. Vancomycin restarted. TEE 2/7 with no vegetation.  - Continue vancomycin.   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   - resolved 15. Hypernatremia: Na 142 Continue free water. No  change 16. Hematuria: Resolved with foley change.   CRITICAL CARE Performed by: Arvilla Meresaniel Bensimhon  Total critical care time: 35 minutes  Critical care time was exclusive of separately billable procedures and treating other patients.  Critical care was necessary to treat or prevent imminent or life-threatening deterioration.  Critical care was time spent personally by me on the following activities: development of treatment plan with patient and/or surrogate as well as nursing, discussions with consultants, evaluation of patient's response to treatment, examination of patient, obtaining history from patient or surrogate, ordering and performing treatments and interventions, ordering and review of laboratory studies, ordering and review of radiographic studies, pulse oximetry and re-evaluation of patient's condition.    Length of Stay: [id number]  Arvilla Meresaniel Bensimhon, MD  04/25/2020, 9:38 AM  Advanced Heart Failure Team Pager [phone] (M-F; 7a - 4p)  Please contact [facility] for night-coverage after hours (4p -7a ) and weekends on [email or web address]

## 2020-04-25 NOTE — Progress Notes (Signed)
ANTICOAGULATION CONSULT NOTE  Pharmacy Consult for bivalirudin Indication: ECMO and VTE  Labs: Recent Labs    04/23/20 0407 04/23/20 0411 04/24/20 0406 04/24/20 0414 04/24/20 1640 04/24/20 1648 04/25/20 0234 04/25/20 0403 04/25/20 0413  HGB 7.8*   < > 8.8*   < > 8.9*   < > 7.5* 8.5* 8.2*  HCT 26.0*   < > 28.0*   < > 28.3*   < > 22.0* 25.6* 24.0*  PLT 184   < > 180  --  182  --   --  184  --   APTT 63*   < > 58*  --  66*  --   --  63*  --   LABPROT 17.6*  --  17.9*  --   --   --   --  17.8*  --   INR 1.5*  --  1.5*  --   --   --   --  1.5*  --   CREATININE 0.68   < > 0.67  --  0.62  --   --  0.73  --    < > = values in this interval not displayed.    Assessment: 60 yoM admitted with COVID-19 PNA with worsening hypoxia, s/p cannulation for ECMO. Pt was started on IV heparin prior to cannulation due to acute DVTs and possible PE, transitioned to bivalirudin with ECMO. Now s/p tPA on 1/5 and tracheostomy on 1/6. Circuit last changed 1/26.  APTT therapeutic at 63 seconds on bivalirudin 0.085 mg/kg/hr. H/H stable, fibrinogen and LDH stable.  Goal of Therapy:  aPTT 60-80 seconds   Plan:  -Continue bivalirudin 0.085 mg/kg/hr (using order-specific wt 72.1kg) -Monitor q12h aPTT/CBC, LDH, and for s/sx of bleeding  Fredonia Highland, PharmD, BCPS, [facility] Clinical Pharmacist [phone] Please check AMION for all [facility] numbers 04/25/2020

## 2020-04-25 NOTE — Progress Notes (Signed)
ANTICOAGULATION CONSULT NOTE  Pharmacy Consult for bivalirudin Indication: ECMO and VTE  Labs: Recent Labs    04/23/20 0407 04/23/20 0411 04/24/20 0406 04/24/20 0414 04/24/20 1640 04/24/20 1648 04/25/20 0403 04/25/20 0413 04/25/20 1108 04/25/20 1600  HGB 7.8*   < > 8.8*   < > 8.9*   < > 8.5* 8.2* 8.5* 8.4*  HCT 26.0*   < > 28.0*   < > 28.3*   < > 25.6* 24.0* 25.0* 27.5*  PLT 184   < > 180  --  182  --  184  --   --  189  APTT 63*   < > 58*  --  66*  --  63*  --   --  62*  LABPROT 17.6*  --  17.9*  --   --   --  17.8*  --   --   --   INR 1.5*  --  1.5*  --   --   --  1.5*  --   --   --   CREATININE 0.68   < > 0.67  --  0.62  --  0.73  --   --  0.77   < > = values in this interval not displayed.    Assessment: 82 yoM admitted with COVID-19 PNA with worsening hypoxia, s/p cannulation for ECMO. Pt was started on IV heparin prior to cannulation due to acute DVTs and possible PE, transitioned to bivalirudin with ECMO. Now s/p tPA on 1/5 and tracheostomy on 1/6. Circuit last changed 1/26.  PM aPTT is therapeutic at 62 sec.  No bleeding reported.  Goal of Therapy:  aPTT 60-80 seconds   Plan:  -Continue bivalirudin at 0.085 mg/kg/hr (using order-specific wt 72.1kg) -Monitor q12h aPTT/CBC, LDH, and for s/sx of bleeding  Iliany Losier D. Laney Potash, PharmD, BCPS, BCCCP 04/25/2020, 4:59 PM

## 2020-04-25 NOTE — Progress Notes (Signed)
Hypoglycemic Event  CBG: 36  Treatment: 1 amp D50  Symptoms: lethargic  CBG Result: 124  Possible Reasons for Event: cortrak clogged, TF stopped, unclogging protocol initiated.    MD Chestine Spore notified. D10 started at 32ml. Will continue to monitor.

## 2020-04-25 NOTE — Progress Notes (Signed)
Continued working on mobility today. Pt stood with Stedy at bedside with minimal assistance. Pt was able to rise from a seated position using the Stedy and maintain balance unassisted for 3.5 minutes unassisted. Pt performed exercise 3 more times for for a total of 10 minutes of standing.  Pt practiced shifting balance back and forth from each leg while standing with min-mod assist.  Calf raises 3 sets x 5 reps  Pt stood on scale at bedside this evening with moderate 2+ assist to chair. Pt

## 2020-04-25 NOTE — Procedures (Signed)
Extracorporeal support note  ECLS cannulation date: 03-25-2020 Last circuit change: 04/07/2020  Indication: Acute hypoxic respiratory failure due to ARDS from COVID-19 pneumonia with RV dysfunction.   Configuration: Venovenous  Drainage cannula: 32 French crescent cannula via right IJ Return cannula: Same  Pump speed: 3500 RPM Pump flow: 4.75 L/min Pump used: Cardio help  Oxygenator: Cardio help O2 blender: 100% Sweep gas: 7L  Circuit check: minimal thrombin in circuit, no clots Anticoagulant: Bivalirudin Anticoagulation targets: PTT 60-80  Changes in support: Con't aggressive mobility- progressing well over the past few days. Motivated to keep working with SLP to advance his diet. Reducing FWF now that taking clears. Need to watch I/OS- may need diuretic adjustment.  Anticipated goals/duration of support: Bridge to recovery vs transplant. Ongoing communication with [facility] lung transplant.  Multidisciplinary ECMO rounds completed.  Steffanie Dunn, DO 04/25/20 8:03 AM  Pulmonary & Critical Care

## 2020-04-25 NOTE — Progress Notes (Signed)
  Speech Language Pathology Treatment: Dysphagia  Patient Details Name: Alex [name] MRN: [id number] DOB: 1970-12-14 Today's Date: 04/25/2020 Time: 6073-7106 SLP Time Calculation (min) (ACUTE ONLY): 19 min  Assessment / Plan / Recommendation Clinical Impression  Pt was seen for dysphagia treatment. Pt's RN, Luther Parody, reported that the pt has not had any p.o. intake today due to an episode of coughing later yesterday which resulted in emesis. RN indicated that the coughing was not associated with p.o. intake. Pt tolerated ice chips, thin liquids, puree solids and [place] ice boluses in a similar progression as presented on 2/12. No immediate s/sx of aspiration were noted following trials. Secondary swallows were inconsistently observed with full-tsp boluses of puree, suggesting possible pharyngeal residue. Pt exhibited two sets of coughs approximately 2-5 minutes after all trials were completed and pt was reclined slightly; however, RN reported that the pt does have a baseline cough. Pt may continue to have limited floorstock thin liquids via individual sips, and purees and [place] ices if liquids are tolerated well. Pt should be fully upright for p.o. intake and intake discontinued if pt becomes symptomatic of aspiration. Considering his history and presentation, an instrumental assessment is recommended prior to initiating a full p.o. diet.    HPI HPI: 50 year old man admitted to hospital 12/28 with 1 week history of dyspnea cough nausea and vomiting.  Initially admitted to [facility] long hospital and placed on high flow nasal cannula but rapidly failed and required intubation 12/29.  Persistent hypoxic respiratory failure with PF ratio 55 in spite of 18 of PEEP FiO2 0.1 despite paralytics.  Did not improve with prone ventilation. Cannulated for VV ECMO 12/30 via right IJ crescent cannula. Iatrogenic pneumothorax from left subclavian triple-lumen placement. Trach on 1/6. Decannulated 04/16/20      SLP  Plan   (FEES)       Recommendations  Diet recommendations: Thin liquid (limited floorstock thin liqiuds, italian ices, purees) Liquids provided via: Straw;Cup Medication Administration: Via alternative means Supervision: Trained caregiver to feed patient Compensations: Slow rate;Small sips/bites Postural Changes and/or Swallow Maneuvers: Seated upright 90 degrees                Plan:  (FEES)       Jona Erkkila I. Vear Clock, MS, CCC-SLP Acute Rehabilitation Services Office number [phone] Pager [phone]                 Scheryl Marten 04/25/2020, 12:36 PM

## 2020-04-26 ENCOUNTER — Inpatient Hospital Stay (HOSPITAL_COMMUNITY): Payer: Medicaid Other

## 2020-04-26 LAB — POCT I-STAT 7, (LYTES, BLD GAS, ICA,H+H)
Acid-Base Excess: 5 mmol/L — ABNORMAL HIGH (ref 0.0–2.0)
Acid-Base Excess: 0 mmol/L (ref 0.0–2.0)
Acid-Base Excess: 1 mmol/L (ref 0.0–2.0)
Acid-Base Excess: 1 mmol/L (ref 0.0–2.0)
Bicarbonate: 29 mmol/L — ABNORMAL HIGH (ref 20.0–28.0)
Bicarbonate: 26.3 mmol/L (ref 20.0–28.0)
Bicarbonate: 28.6 mmol/L — ABNORMAL HIGH (ref 20.0–28.0)
Bicarbonate: 27.8 mmol/L (ref 20.0–28.0)
Calcium, Ion: 1.24 mmol/L (ref 1.15–1.40)
Calcium, Ion: 1.26 mmol/L (ref 1.15–1.40)
Calcium, Ion: 1.3 mmol/L (ref 1.15–1.40)
Calcium, Ion: 1.27 mmol/L (ref 1.15–1.40)
HCT: 22 % — ABNORMAL LOW (ref 39.0–52.0)
HCT: 28 % — ABNORMAL LOW (ref 39.0–52.0)
HCT: 29 % — ABNORMAL LOW (ref 39.0–52.0)
HCT: 29 % — ABNORMAL LOW (ref 39.0–52.0)
Hemoglobin: 7.5 g/dL — ABNORMAL LOW (ref 13.0–17.0)
Hemoglobin: 9.5 g/dL — ABNORMAL LOW (ref 13.0–17.0)
Hemoglobin: 9.9 g/dL — ABNORMAL LOW (ref 13.0–17.0)
Hemoglobin: 9.9 g/dL — ABNORMAL LOW (ref 13.0–17.0)
O2 Saturation: 100 %
O2 Saturation: 95 %
O2 Saturation: 99 %
O2 Saturation: 99 %
Patient temperature: 36.8
Patient temperature: 36.8
Patient temperature: 37
Patient temperature: 99
Potassium: 3.4 mmol/L — ABNORMAL LOW (ref 3.5–5.1)
Potassium: 3.8 mmol/L (ref 3.5–5.1)
Potassium: 4.1 mmol/L (ref 3.5–5.1)
Potassium: 4.3 mmol/L (ref 3.5–5.1)
Sodium: 139 mmol/L (ref 135–145)
Sodium: 137 mmol/L (ref 135–145)
Sodium: 137 mmol/L (ref 135–145)
Sodium: 136 mmol/L (ref 135–145)
TCO2: 30 mmol/L (ref 22–32)
TCO2: 28 mmol/L (ref 22–32)
TCO2: 30 mmol/L (ref 22–32)
TCO2: 29 mmol/L (ref 22–32)
pCO2 arterial: 37.1 mmHg (ref 32.0–48.0)
pCO2 arterial: 48.3 mmHg — ABNORMAL HIGH (ref 32.0–48.0)
pCO2 arterial: 58.4 mmHg — ABNORMAL HIGH (ref 32.0–48.0)
pCO2 arterial: 55.3 mmHg — ABNORMAL HIGH (ref 32.0–48.0)
pH, Arterial: 7.501 — ABNORMAL HIGH (ref 7.350–7.450)
pH, Arterial: 7.343 — ABNORMAL LOW (ref 7.350–7.450)
pH, Arterial: 7.298 — ABNORMAL LOW (ref 7.350–7.450)
pH, Arterial: 7.31 — ABNORMAL LOW (ref 7.350–7.450)
pO2, Arterial: 173 mmHg — ABNORMAL HIGH (ref 83.0–108.0)
pO2, Arterial: 80 mmHg — ABNORMAL LOW (ref 83.0–108.0)
pO2, Arterial: 141 mmHg — ABNORMAL HIGH (ref 83.0–108.0)
pO2, Arterial: 158 mmHg — ABNORMAL HIGH (ref 83.0–108.0)

## 2020-04-26 LAB — BASIC METABOLIC PANEL
Anion gap: 7 (ref 5–15)
Anion gap: 8 (ref 5–15)
BUN: 44 mg/dL — ABNORMAL HIGH (ref 6–20)
BUN: 47 mg/dL — ABNORMAL HIGH (ref 6–20)
CO2: 27 mmol/L (ref 22–32)
CO2: 27 mmol/L (ref 22–32)
Calcium: 8.6 mg/dL — ABNORMAL LOW (ref 8.9–10.3)
Calcium: 8.7 mg/dL — ABNORMAL LOW (ref 8.9–10.3)
Chloride: 105 mmol/L (ref 98–111)
Chloride: 101 mmol/L (ref 98–111)
Creatinine, Ser: 0.6 mg/dL — ABNORMAL LOW (ref 0.61–1.24)
Creatinine, Ser: 0.66 mg/dL (ref 0.61–1.24)
GFR, Estimated: >60 mL/min (ref >60)
GFR, Estimated: >60 mL/min (ref >60)
Glucose, Bld: 119 mg/dL — ABNORMAL HIGH (ref 70–99)
Glucose, Bld: 231 mg/dL — ABNORMAL HIGH (ref 70–99)
Potassium: 3.4 mmol/L — ABNORMAL LOW (ref 3.5–5.1)
Potassium: 4.1 mmol/L (ref 3.5–5.1)
Sodium: 139 mmol/L (ref 135–145)
Sodium: 136 mmol/L (ref 135–145)

## 2020-04-26 LAB — LACTIC ACID, PLASMA
Lactic Acid, Venous: 0.9 mmol/L (ref 0.5–1.9)
Lactic Acid, Venous: 1.1 mmol/L (ref 0.5–1.9)

## 2020-04-26 LAB — GLUCOSE, CAPILLARY
Glucose-Capillary: 115 mg/dL — ABNORMAL HIGH (ref 70–99)
Glucose-Capillary: 117 mg/dL — ABNORMAL HIGH (ref 70–99)
Glucose-Capillary: 174 mg/dL — ABNORMAL HIGH (ref 70–99)
Glucose-Capillary: 279 mg/dL — ABNORMAL HIGH (ref 70–99)
Glucose-Capillary: 222 mg/dL — ABNORMAL HIGH (ref 70–99)
Glucose-Capillary: 112 mg/dL — ABNORMAL HIGH (ref 70–99)
Glucose-Capillary: 113 mg/dL — ABNORMAL HIGH (ref 70–99)

## 2020-04-26 LAB — CBC
HCT: 23.5 % — ABNORMAL LOW (ref 39.0–52.0)
HCT: 29.2 % — ABNORMAL LOW (ref 39.0–52.0)
Hemoglobin: 7.4 g/dL — ABNORMAL LOW (ref 13.0–17.0)
Hemoglobin: 9.7 g/dL — ABNORMAL LOW (ref 13.0–17.0)
MCH: 29.6 pg (ref 26.0–34.0)
MCH: 31.1 pg (ref 26.0–34.0)
MCHC: 31.5 g/dL (ref 30.0–36.0)
MCHC: 33.2 g/dL (ref 30.0–36.0)
MCV: 94 fL (ref 80.0–100.0)
MCV: 93.6 fL (ref 80.0–100.0)
Platelets: 169 10*3/uL (ref 150–400)
Platelets: 190 10*3/uL (ref 150–400)
RBC: 2.5 MIL/uL — ABNORMAL LOW (ref 4.22–5.81)
RBC: 3.12 MIL/uL — ABNORMAL LOW (ref 4.22–5.81)
RDW: 18.1 % — ABNORMAL HIGH (ref 11.5–15.5)
RDW: 18.2 % — ABNORMAL HIGH (ref 11.5–15.5)
WBC: 8.6 10*3/uL (ref 4.0–10.5)
WBC: 11.8 10*3/uL — ABNORMAL HIGH (ref 4.0–10.5)
nRBC: 0.5 % — ABNORMAL HIGH (ref 0.0–0.2)
nRBC: 0.4 % — ABNORMAL HIGH (ref 0.0–0.2)

## 2020-04-26 LAB — APTT
aPTT: 70 seconds — ABNORMAL HIGH (ref 24–36)
aPTT: 63 seconds — ABNORMAL HIGH (ref 24–36)

## 2020-04-26 LAB — LACTATE DEHYDROGENASE: LDH: 449 U/L — ABNORMAL HIGH (ref 98–192)

## 2020-04-26 LAB — PREPARE RBC (CROSSMATCH)

## 2020-04-26 LAB — HEMOGLOBIN AND HEMATOCRIT, BLOOD
HCT: 33.2 % — ABNORMAL LOW (ref 39.0–52.0)
Hemoglobin: 10.5 g/dL — ABNORMAL LOW (ref 13.0–17.0)

## 2020-04-26 LAB — PROTIME-INR
INR: 1.6 — ABNORMAL HIGH (ref 0.8–1.2)
Prothrombin Time: 18.5 seconds — ABNORMAL HIGH (ref 11.4–15.2)

## 2020-04-26 MED ORDER — METOCLOPRAMIDE HCL 5 MG/ML IJ SOLN
10.0000 mg | Freq: Once | INTRAMUSCULAR | Status: AC
  Administered 2020-04-26: 10 mg via INTRAVENOUS

## 2020-04-26 MED ORDER — DOXAZOSIN MESYLATE 2 MG PO TABS
2.0000 mg | ORAL_TABLET | Freq: Every day | ORAL | Status: DC
  Administered 2020-04-27 – 2020-05-01 (×5): 2 mg
  Filled 2020-04-26 (×6): qty 1

## 2020-04-26 MED ORDER — IPRATROPIUM-ALBUTEROL 0.5-2.5 (3) MG/3ML IN SOLN
3.0000 mL | Freq: Two times a day (BID) | RESPIRATORY_TRACT | Status: DC
  Administered 2020-04-26 – 2020-05-13 (×33): 3 mL via RESPIRATORY_TRACT
  Filled 2020-04-26 (×34): qty 3

## 2020-04-26 MED ORDER — INSULIN DETEMIR 100 UNIT/ML ~~LOC~~ SOLN
40.0000 [IU] | Freq: Two times a day (BID) | SUBCUTANEOUS | Status: DC
  Administered 2020-04-26: 40 [IU] via SUBCUTANEOUS
  Filled 2020-04-26 (×3): qty 0.4

## 2020-04-26 MED ORDER — SALINE SPRAY 0.65 % NA SOLN
1.0000 | NASAL | Status: DC | PRN
  Administered 2020-04-30: 1 via NASAL
  Filled 2020-04-26 (×2): qty 44

## 2020-04-26 MED ORDER — SODIUM CHLORIDE 0.9% IV SOLUTION: Freq: Once | INTRAVENOUS | Status: AC

## 2020-04-26 MED ORDER — FLORANEX PO PACK
1.0000 g | PACK | Freq: Three times a day (TID) | ORAL | Status: DC
  Administered 2020-04-26: 1 g
  Filled 2020-04-26 (×3): qty 1

## 2020-04-26 MED ORDER — DOXAZOSIN MESYLATE 2 MG PO TABS
2.0000 mg | ORAL_TABLET | Freq: Every day | ORAL | Status: DC
  Administered 2020-04-26: 2 mg via ORAL
  Filled 2020-04-26: qty 1

## 2020-04-26 MED ORDER — QUETIAPINE FUMARATE 25 MG PO TABS
25.0000 mg | ORAL_TABLET | Freq: Every day | ORAL | Status: DC
  Administered 2020-04-26 – 2020-04-29 (×4): 25 mg
  Filled 2020-04-26 (×5): qty 1

## 2020-04-26 MED ORDER — POTASSIUM CHLORIDE 10 MEQ/50ML IV SOLN
10.0000 meq | INTRAVENOUS | Status: AC
  Administered 2020-04-26 (×4): 10 meq via INTRAVENOUS
  Filled 2020-04-26 (×4): qty 50

## 2020-04-26 MED ORDER — GABAPENTIN 250 MG/5ML PO SOLN
400.0000 mg | Freq: Three times a day (TID) | ORAL | Status: DC
  Administered 2020-04-26 – 2020-04-30 (×12): 400 mg
  Filled 2020-04-26 (×13): qty 8

## 2020-04-26 MED ORDER — INSULIN DETEMIR 100 UNIT/ML ~~LOC~~ SOLN
25.0000 [IU] | Freq: Two times a day (BID) | SUBCUTANEOUS | Status: DC
  Administered 2020-04-26: 25 [IU] via SUBCUTANEOUS
  Filled 2020-04-26 (×2): qty 0.25

## 2020-04-26 NOTE — Progress Notes (Signed)
ANTICOAGULATION CONSULT NOTE  Pharmacy Consult for bivalirudin Indication: ECMO and VTE  Labs: Recent Labs    04/24/20 0406 04/24/20 0414 04/25/20 0403 04/25/20 0413 04/25/20 1600 04/25/20 1727 04/25/20 1940 04/26/20 0450  HGB 8.8*   < > 8.5*   < > 8.4* 9.5* 8.5* 7.4*  7.5*  HCT 28.0*   < > 25.6*   < > 27.5* 28.0* 25.0* 23.5*  22.0*  PLT 180   < > 184  --  189  --   --  169  APTT 58*   < > 63*  --  62*  --   --  70*  LABPROT 17.9*  --  17.8*  --   --   --   --  18.5*  INR 1.5*  --  1.5*  --   --   --   --  1.6*  CREATININE 0.67   < > 0.73  --  0.77  --   --  0.60*   < > = values in this interval not displayed.    Assessment: 28 yoM admitted with COVID-19 PNA with worsening hypoxia, s/p cannulation for ECMO. Pt was started on IV heparin prior to cannulation due to acute DVTs and possible PE, transitioned to bivalirudin with ECMO. Now s/p tPA on 1/5 and tracheostomy on 1/6. Circuit last changed 1/26.  aPTT is therapeutic at 70 sec. No bleeding reported. LDH stable 400s. Hgb down to 7s this morning.   Goal of Therapy:  aPTT 60-80 seconds   Plan:  -Continue bivalirudin at 0.085 mg/kg/hr (using order-specific wt 72.1kg) -Monitor q12h aPTT/CBC, LDH, and for s/sx of bleeding  Sheppard Coil PharmD., BCPS Clinical Pharmacist 04/26/2020 7:51 AM

## 2020-04-26 NOTE — Progress Notes (Signed)
Focus of session on B UE ROM and R hand to mouth movements as a precursor to self feeding and grooming. Tolerated well.    04/26/20 1400  OT Visit Information  Last OT Received On 04/26/20  Assistance Needed +2  History of Present Illness Pt is 50 y.o. male  with no significant past medical history admitted on 03/06/2020 with dyspnea, cough, nausea/vomiting ~ 1 week ago with worsening symptoms of body aches and fatigue and +COVID 02/07/20 and admitted with shortness of breath. Required intubation 03/10/20. Cannulated for VV ECMO 2020/03/24. Oxygenating better with ECMO. Also evidence of RLE DVT, RV thrombus, and high suspicion of PE. Has required chest tube to L lung for collapse which needs further reposition with recurrent collapse. Extubated 03-15-19.  Reintubated 1/5 and trach placed 1/6. Decannulated 04/16/20.  Precautions  Precautions Fall  Precaution Comments ECMO, NGtube, Foley  Pain Assessment  Pain Assessment Faces  Faces Pain Scale 6  Pain Location L fingers with passive flexion  Pain Descriptors / Indicators Grimacing;Guarding;Discomfort  Pain Intervention(s) Monitored during session;Repositioned  Cognition  Arousal/Alertness Awake/alert  Behavior During Therapy WFL for tasks assessed/performed  Overall Cognitive Status Impaired/Different from baseline  General Comments pt asking exactly what location he is in  Upper Extremity Assessment  LUE Deficits / Details necrotic fingers 1-3 and thumb, edematous  ADL  General ADL Comments worked on R hand to mouth in preparation for self feeding with OT supporting under elbow, pt to have MBS on 04/27/20  Exercises  Exercises General Upper Extremity  General Exercises - Upper Extremity  Shoulder Flexion AAROM;Both;10 reps;Supine  Elbow Flexion Both;10 reps;Supine;AROM  Elbow Extension Both;10 reps;Supine;AROM  Shoulder ABduction AAROM;Both;10 reps;Supine  Wrist Flexion AROM;AAROM;Both;10 reps;Supine  Wrist Extension PROM;Both;10  reps;Supine  Digit Composite Flexion Left;5 reps;Supine  Composite Extension Left;5 reps;Supine  Other Exercises  Other Exercises applied lotion to L wrist/forearm, retrograde massage  OT - End of Session  Activity Tolerance Patient tolerated treatment well  Patient left in bed;with call bell/phone within reach  OT Assessment/Plan  OT Plan Discharge plan remains appropriate  OT Visit Diagnosis Muscle weakness (generalized) (M62.81);Other symptoms and signs involving cognitive function;Other abnormalities of gait and mobility (R26.89)  OT Frequency (ACUTE ONLY) Min 2X/week  Follow Up Recommendations CIR;Supervision/Assistance - 24 hour  OT Equipment Other (comment) (defer to next venue)  AM-PAC OT "6 Clicks" Daily Activity Outcome Measure (Version 2)  Help from another person eating meals? 1  Help from another person taking care of personal grooming? 1  Help from another person toileting, which includes using toliet, bedpan, or urinal? 1  Help from another person bathing (including washing, rinsing, drying)? 1  Help from another person to put on and taking off regular upper body clothing? 1  Help from another person to put on and taking off regular lower body clothing? 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

## 2020-04-26 NOTE — Procedures (Signed)
Extracorporeal support note  ECLS cannulation date: 02/18/2020 Last circuit change: 04/07/2020  Indication: Acute hypoxic respiratory failure due to ARDS from COVID-19 pneumonia with RV dysfunction.   Configuration: Venovenous  Drainage cannula: 32 French crescent cannula via right IJ Return cannula: Same  Pump speed: 3500 RPM Pump flow: 4.88 L/min Pump used: Cardio help  Oxygenator: Cardio help O2 blender: 100% Sweep gas: 7.5L  Circuit check: minimal thrombin in circuit, no clots Anticoagulant: Bivalirudin Anticoagulation targets: PTT 60-80  Changes in support: continue mobility as tolerated to prevent muscle atrophy, slowly wean down on sedating meds  Anticipated goals/duration of support: Bridge to recovery vs transplant. Ongoing communication with [facility] transplant (Dr. Chestine Spore coordinating, appreciate help).  Multidisciplinary ECMO rounds completed.  Lorin Glass, MD 04/26/20 7:58 AM [place] [facility]

## 2020-04-26 NOTE — Progress Notes (Signed)
ANTICOAGULATION CONSULT NOTE  Pharmacy Consult for bivalirudin Indication: ECMO and VTE  Labs: Recent Labs    04/24/20 0406 04/24/20 0414 04/25/20 0403 04/25/20 0413 04/25/20 1600 04/25/20 1727 04/26/20 0450 04/26/20 0839 04/26/20 1119 04/26/20 1632 04/26/20 1636  HGB 8.8*   < > 8.5*   < > 8.4*   < > 7.4*  7.5*   < > 10.5* 9.7* 9.9*  HCT 28.0*   < > 25.6*   < > 27.5*   < > 23.5*  22.0*   < > 33.2* 29.2* 29.0*  PLT 180   < > 184  --  189  --  169  --   --  190  --   APTT 58*   < > 63*  --  62*  --  70*  --   --  63*  --   LABPROT 17.9*  --  17.8*  --   --   --  18.5*  --   --   --   --   INR 1.5*  --  1.5*  --   --   --  1.6*  --   --   --   --   CREATININE 0.67   < > 0.73  --  0.77  --  0.60*  --   --   --   --    < > = values in this interval not displayed.    Assessment: 31 yoM admitted with COVID-19 PNA with worsening hypoxia, s/p cannulation for ECMO. Pt was started on IV heparin prior to cannulation due to acute DVTs and possible PE, transitioned to bivalirudin with ECMO. Now s/p tPA on 1/5 and tracheostomy on 1/6. Circuit last changed 1/26.  aPTT is therapeutic at 63 sec. No bleeding reported. LDH stable 400s. Hgb up to 10 after transfusion. Patient up in chair talking with RN.   Goal of Therapy:  aPTT 60-80 seconds   Plan:  -Continue bivalirudin at 0.085 mg/kg/hr (using order-specific wt 72.1kg) -Monitor q12h aPTT/CBC, LDH, and for s/sx of bleeding  Link Snuffer, PharmD, BCPS, BCCCP Clinical Pharmacist Please refer to [facility] for [facility] Pharmacy numbers 04/26/2020, 5:38 PM

## 2020-04-26 NOTE — Progress Notes (Signed)
ECMO PROGRESS NOTE  NAME:  Alex [name], MRN:  [id number], DOB:  September 03, 1970, LOS: 48 ADMISSION DATE:  02/21/2020, CONSULTATION DATE: 02/20/2020 REFERRING MD: Wynona Neat -LBPCCM, CHIEF COMPLAINT: Respiratory failure requiring ECMO  HPI/course in hospital  50 year old man admitted to hospital 12/28 with 1 week history of dyspnea cough nausea and vomiting.  Initially admitted to [facility] long hospital and placed on high flow nasal cannula but rapidly failed and required intubation 12/29.  Persistent hypoxic respiratory failure with PF ratio 55 in spite of 18 of PEEP FiO2 0.1 despite paralytics.  Did not improve with prone ventilation  Cannulated for VV ECMO 12/30 via right IJ crescent cannula.  ECMO circuit was changed on 04/07/2020 Iatrogenic pneumothorax from left subclavian triple-lumen placement  12/28 admitted covid, ARDS 12/30 ECMO cannulation, iatrogenic pneumothrorax, DVT, Bivalrudin started           Left subclavian hematoma, chest tube, ceftriaxone/ azithromycin started 12/31 1Unit PRBC 1/1 Palliative consult, noted HTN, fentanyl only 1/2 Extubation I/O positive, left lung re-expanded, EF 60-65%, Lasix 20mg  BID 1/3 BiPAP in place, HTN to 190s, 200s, labetolol for BP, I/O even, cefepime and vancomycin started 1/4 agitation off BiPAP, possible aspiration 1/5 agitation overnight, reintubated      ECHO with RV dilation, ECMO cannula repositioned, LUE DVT (+), 1/2 dose tPA given,      I/O up, on lasix, Epi, NE, vasopressin started, HR/ rhythm issues noted 1/6 Tracheostomy, hypoglycemic when off tube feeds       Cortrack tune issues 1/7 on lasix gtt 4mg /hr with diuresis, agitation improved 1/8 starting steroid taper 1/9 severe agitation, heavy sedation required, lasix gtt 6mg /hr, I/Os (-)       precedex and fentanyl 1/10 Vanc stopped 1/11 sweep to 2, lasix gtt at 4mg /hr, weight up, metoprolol added for HTN 1/12 fevers to 99 noted, lasix gtt and metazolone 1/13 lasix gtt dced 1/14  intermittent HTN, possibly flash pulmonary edema tied to sedation        Ischemic left hand changes, 1 Unit PRBCs 1/15 1/16 sweep from 4 to 2.5, chest tube removed 1/17 sweep at 6, trial of nebulized morphine for cough, LUE dopplers show no obstruction 1/18 two events of 2nd degree type II HB noted with coughing         sweep at 4, IV lasix 40, acetazolamide, distal XLT trach placement         agitation and BP spikes 1/19 agitation, air hunger, seroquel, klonopin, oxy, valproate, ketamine trial 1/20 sweep at 3.5, diuresis results in chugging, albumin given         1 Unit PRBCs, lidocaine nebulizers for cough, ancef started 1/21 sweep to 8, anxiety, I/Os (+) with lasix and acetazolamide 1/22 sweep at 5, chest CT bilateral upper lobe PEs 1/23 sweep at 5, delirium, I/Os even with lasix and acetazolamide         continued coughing, 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 1/24 sweep at 7.5, 1 Unit PRBCs, repeat bronchoscopy showed resolution of 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 1/25 sweep at 7.5, on propofol, lasix gtt at 40ml/hr, I/Os (+)         nebulized morphine and lidocaine for cough 1/26 sweep at 8, propofol/ precedex, cough present when weaned, lasix gtt at 96ml/hr        ECMO circuit changes, levophed started, 1 Unit PRBCs 1/27 sweep at 4, off sedation, delirium, I/Os even        lasix gtt restated, acetazolamide overnight 1/28 sweep at 7.5, HTN  labile 1/29 sweep of 6, patient reports a tickle in throat, cetacaine 1/30 sweep down to 6, cough improved with cetacaine and gabapentin 1/31 sweep at 5, agitation, off acetazolamide, lasix, weight up 2/1 cough, of pulmicort, cetacaine for cough, continued gabapentin, dexmethorphan       CT demonstrated trach placement against back of trachea 2/2 Trach collar trial somewhat effective in managing cough       low dose diltiazem for HR,  2/3 sweep at 4,trach cannula replacement (longer, more flexible bivona); brochoscopy shows  irritated mucosa       IV lasix and acetazolamide 2/4 sweep at 5, I/Os (+), BUN elevated, lasix gtt with diamox,       trach decannulation, increased gabapentin for cough 2/5 start HCAP coverage for rising WBC/temps, check Pct/cultures, CXR worse with lack of PEEP, diuresed well but started getting very hypotensive, sepsis vs. Too dry 04/18/20 blood cx grew E faecalis 2/7 PICC change, TEE  Past Medical History  none  Interim history/subjective:  No events. Did well with PT yesterday. Denies pain.  Objective   Blood pressure (!) 141/91, pulse (!) 102, temperature 98.7 F (37.1 C), resp. rate (!) 27, height 5\' 4"  (1.626 m), weight 70.9 kg, SpO2 100 %.        Intake/Output Summary (Last 24 hours) at 04/26/2020 0746 Last data filed at 04/26/2020 0700 Gross per 24 hour  Intake 2724.87 ml  Output 2490 ml  Net 234.87 ml   Filed Weights   04/24/20 0605 04/25/20 0500 04/26/20 0500  Weight: 68.1 kg 71 kg 70.9 kg    Examination: Constitutional: no acute distress, looks much better with higher sweeps  Eyes: EOMI, pupils equal Ears, nose, mouth, and throat: tracheal stoma covered, some air still escapes, MMM Cardiovascular: RRR, ext warm Respiratory: shallow inspiratory  Gastrointestinal: soft, +BS Skin: No rashes, normal turgor, L hand ischemic changes stable/improving Neurologic: moves all 4 ext to command Psychiatric: much more alert and oriented than when I saw him a week ago  CBG low: cortrack fell out, required push of d50 K low being repleted Net even, Cr okay Lactate benign ABG alkalotic, bicarb 29 H/H slightly down, getting 1 unit Hemolysis parameters look stable  Assessment & Plan:  Acute hypoxemic/hypercapnic respiratory failure due to severe ARDS from COVID-19 pneumonia Probable acute PE, and RV dysfunction s/p TPA Refractory coughing- improved after trach removal but still an issue Strep group F/ enterococcal pneumonia- s/p 10 days vanc 1/29; recurrent with  bacteremia on 2/5. - Continue VV ECMO, sweep weans titrated to WOB/ABG - Cough regimen as ordered - Trial of metanebs - Continue supplemental oxygen via nasal cannula, can decrease as his oxygen improves. -IS, flutter, OOB mobility   - [facility] transplant has been contacted and is reviewing his case  HTN.  Trials of reducing HR/BP have resulted in reduced pCO2 (likely related to fractional flow through ECMO circuit) -Continue coreg. -Clonidine at BID, can slowly wean off over next week depending on BP curve to reduce its CNS effects  Acute delirium, with agitation- improved. -RASS goal -1 to 0, PRN, morphine PRN, oxycodone 15mg > now q8h, qHS seroquel decreased on 2/11 and again today. Klononpin to 1mg  tid to help improve alertness during the day -Plans to slowly wean this over next couple weeks  Sepsis secondary to E faecalis bacteremia- secondary to PNA, has grown enterococcus in sputum prior, previous vanc course completed 1/29. TEE on 2/7 without vegetations. PICC changed on 2/7. -Continue vanc through 2/21 per ID  Gastroparesis with some element ileus: improved but occasional vomiting spells with coughing attacks.  Stable - Reglan back to 10mg  Q8h on 2/11. 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  - wound care following, appreciate dressing recommendations - surgical consult for L hand once more stable - keep A-line out of left arm   Hyperglycemia due to diabetes type II- lost cortrak this am -decrease levemir 25 units BID, SSI prn, d10 for now -resume levemir 40 units BID once cortrak fixed -TF coverage to 10 units Q4h with hold parameters -goal BG 140-180  Acute urinary retention; no evidence of UTI based on UA Hematuria -failed condom cath trial, foley replaced 2/7; again on 2/11 -con't bethenacol (off since 1/30)  -increase mobility, decrease meds  that could be precipitating this -start cardura  Hypernatremia, resolved -con't to monitor -lasix and diamox once daily; goal balanced diuresis  Deconditioning -increasing therapy now that encephalopathy and sepsis improved -con't working with PT, OT, SLP   Daily Goals Checklist  Pain/Anxiety/Delirium protocol (if indicated): see above VAP protocol (if indicated):  n/a DVT prophylaxis: bivalirudin GI prophylaxis: pantoprazole Glucose control: basal bolus insulin Mobility/therapy needs: Mobilization as tolerated Code Status: Full code Disposition: ICU  Patient critically ill due to ARDS Interventions to address this today ECMO titration Risk of deterioration without these interventions is high  I personally spent 34 minutes providing critical care not including any separately billable procedures  2/30 MD [place] [facility] 04/12/2020 7:23 AM Prefer epic messenger for cross cover needs If after hours, please call E-link

## 2020-04-26 NOTE — Progress Notes (Addendum)
Physical Therapy Treatment Patient Details Name: Alex [name] MRN: [id number] DOB: Dec 24, 1970 Today's Date: 04/26/2020    History of Present Illness Pt is 50 y.o. male  with no significant past medical history admitted on 03/06/2020 with dyspnea, cough, nausea/vomiting ~ 1 week ago with worsening symptoms of body aches and fatigue and +COVID 02/07/20 and admitted with shortness of breath. Required intubation 03/10/20. Cannulated for VV ECMO 03-16-2020. Oxygenating better with ECMO. Also evidence of RLE DVT, RV thrombus, and high suspicion of PE. Has required chest tube to L lung for collapse which needs further reposition with recurrent collapse. Extubated 03-15-19.  Reintubated 1/5 and trach placed 1/6. Decannulated 04/16/20.    PT Comments    Pt has had very busy morning with speech therapy and Cortrak placement but is eager to get up with therapy to attempt ambulation. Pt command follow is improving. Pt is mod-maxA for transfers. Once in standing pt is able to tolerate 2 bouts of ambulation, x4 feet and x 8 feet with RW. Pt is very stiff and with distance able to improve his LE advancement with assisted hip rotation. Pt returned to bed after therapy. PT told by RN that to be considered for lung transplant at [facility] pt will need to be able to ambulate 1000 feet. Will work with pt to progress towards that goal. Short term goals reviewed and updated today.     Follow Up Recommendations  CIR;Supervision/Assistance - 24 hour     Equipment Recommendations  Other (comment) (TBD (pt still on ECMO))       Precautions / Restrictions Precautions Precautions: Fall Precaution Comments: ECMO, NGtube, Foley, Pt decannulated himself 2/4 Splint/Cast:  (B Prevalon boots) Restrictions Weight Bearing Restrictions: No    Mobility  Bed Mobility Overal bed mobility: Needs Assistance Bed Mobility: Sit to Supine       Sit to supine: Total assist;+2 for physical assistance;+2 for safety/equipment Sit to  sidelying: Total assist;+2 for physical assistance (extra 2 persons for lines) General bed mobility comments: requires assist of 2 for assist and 2 for management of lines    Transfers Overall transfer level: Needs assistance Equipment used: Rolling walker (2 wheeled) Transfers: Sit to/from [employer] Transfers Sit to Stand: +2 physical assistance;Mod assist;+2 safety/equipment;Max assist (3rd person for lines) Stand pivot transfers: Max assist       General transfer comment: maxA for power up on first bout, vc for scooting forward in chair, and hands on armrest, on second bout requires heavy modA for standing, vc for hand placement, max A for stand step transfer from recliner to bed after second bout of ambulation  Ambulation/Gait Ambulation/Gait assistance: Total assist;Max assist;+2 physical assistance;+2 safety/equipment Gait Distance (Feet): 8 Feet (1x4, 1x8) Assistive device: Rolling walker (2 wheeled) Gait Pattern/deviations: Step-to pattern;Decreased step length - right;Decreased step length - left;Decreased dorsiflexion - right;Decreased dorsiflexion - left;Decreased weight shift to right;Decreased weight shift to left;Narrow base of support Gait velocity: slowed Gait velocity interpretation: <1.31 ft/sec, indicative of household ambulator General Gait Details: requires total A for support in first bout of 4 feet ambulation, vc for weightshifting, weightbearing through UE, upright posture, after 8 min seated rest break pt able to complete second bout pt requires maxA, PT able to work on pelvic rotation at hips to facilitate weightshift and LE advancement, requires 3 additional people for management of lines and leads and close chair follow       Balance Overall balance assessment: Needs assistance Sitting-balance support: Feet supported;Bilateral upper extremity supported Sitting balance-Leahy  Scale: Poor Sitting balance - Comments: requires UE support but able to sit  at edge of recliner to facilitate core activation   Standing balance support: Bilateral upper extremity supported;During functional activity Standing balance-Leahy Scale: Zero Standing balance comment: requires max outside support to maintain blance                            Cognition Arousal/Alertness: Awake/alert Behavior During Therapy: Flat affect                                   General Comments: require increased cuing      Exercises General Exercises - Lower Extremity Long Arc Quad: AROM;Both;10 reps;Seated Other Exercises Other Exercises: bridging with focus on pelvic rotation x 5    General Comments  Pt vitals monitored by RN and ECMO specialist pt on 4L O2 via [place]. SaO2 dropped to 85%O2 with first bout of ambulation, bumped to 6 L and pt able to maintain SaO2 >90%O2 for second bout of ambulation.       Pertinent Vitals/Pain Pain Assessment: Faces Pain Location: generalized Pain Descriptors / Indicators: Discomfort;Grimacing;Guarding Pain Intervention(s): Limited activity within patient's tolerance;Monitored during session;Repositioned           PT Goals (current goals can now be found in the care plan section) Acute Rehab PT Goals Patient Stated Goal: unable to state PT Goal Formulation: With patient Time For Goal Achievement: 05/10/20 Potential to Achieve Goals: Fair Progress towards PT goals: Progressing toward goals    Frequency    Min 3X/week      PT Plan Current plan remains appropriate       AM-PAC PT "6 Clicks" Mobility   Outcome Measure  Help needed turning from your back to your side while in a flat bed without using bedrails?: Total Help needed moving from lying on your back to sitting on the side of a flat bed without using bedrails?: Total Help needed moving to and from a bed to a chair (including a wheelchair)?: Total Help needed standing up from a chair using your arms (e.g., wheelchair or bedside chair)?:  Total Help needed to walk in hospital room?: Total Help needed climbing 3-5 steps with a railing? : Total 6 Click Score: 6    End of Session Equipment Utilized During Treatment: Gait belt Activity Tolerance: Patient tolerated treatment well Patient left: in bed;with nursing/sitter in room;with call bell/phone within reach Nurse Communication: Mobility status PT Visit Diagnosis: Muscle weakness (generalized) (M62.81);Difficulty in walking, not elsewhere classified (R26.2);Other abnormalities of gait and mobility (R26.89);Unsteadiness on feet (R26.81)     Time: 6384-5364 PT Time Calculation (min) (ACUTE ONLY): 42 min  Charges:  $Gait Training: 23-37 mins $Therapeutic Exercise: 8-22 mins                     Temara Lanum B. Beverely Risen PT, DPT Acute Rehabilitation Services Pager [phone] Office [phone]    Elon Alas Fleet 04/26/2020, 1:27 PM

## 2020-04-26 NOTE — Procedures (Signed)
Cortrak  Person Inserting Tube:  Madigan, Carly, RD Tube Type:  Cortrak - 43 inches Tube Location:  Right nare Initial Placement:  Postpyloric Secured by: Bridle Technique Used to Measure Tube Placement:  Documented cm marking at nare/ corner of mouth Cortrak Secured At:  90 cm   X-ray is required, abdominal x-ray has been ordered by the Cortrak team. Please confirm tube placement before using the Cortrak tube.   If the tube becomes dislodged please keep the tube and contact the Cortrak team at [email or web address] ([email or web address]) for replacement.  If after hours and replacement cannot be delayed, place a NG tube and confirm placement with an abdominal x-ray.    Vanessa Kick RD, LDN Clinical Nutrition Pager listed in AMION

## 2020-04-26 NOTE — Progress Notes (Signed)
Patient ID: Alex [name], male   DOB: [birth year], 50 y.o.   MRN: [id number]     Advanced Heart Failure Rounding Note  PCP-Cardiologist: No primary care provider on file.   Subjective:    - 12/30: VV ECMO cannulation - 12/31: Left chest tube replaced - 1/2: Extubated. Echo with EF 60-65%, mildly dilated RV with mildly decreased systolic function.  - 1/4: Agitated, suspected aspiration.  Re-intubated.  - 1/5: ECMO cannula repositioned under TEE guidance. TEE showed moderately dilated/moderate-severely dysfunctional RV in setting of hypoxemia. LUE DVT found.  Patient got 1/2 dose of TPA due to initial concern for large PE.  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  - 1/6: Tracheostomy - 1/7: Echo with mild RV dilation/mild RV dysfunction.  - 1/16: Left chest tube out - 1/17: LUE arterial dopplers repeated, showed no obstruction.  - 1/20: Echo with EF 65-70%, mildly D-shaped septum, mildly dilated and mildly dysfunctional RV.  - 1/22: CTA chest: Bilateral upper lobe PEs (suspect chronic), changes c/w ARDS - 1/23: Bronchoscopy showed semi-occlusive ?mass/polyp in the trachea.  - 1/24: Bronchoscopy showed resolution of 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 - 1/26: ECMO circuit changed.  - 2/1: CT chest showed diffuse bronchiectasis as well as diffuse opacity consistent with COVID-19 PNA with ARDS. - 2/3: Trach exchange - 2/5: Decannualted to HFNC - 2/6: Enterococcal PNA/bacteremia.  Echo showed EF 65-70%, normal-appearing RV, no vegetation noted.  - 2/7: TEE with no vegetation, EF 55-60%, RV low normal function with normal size.   Stood 5 times and spent 3 hrs in chair. Talking, starting to take pos.  CXR improved.  Cortrack clogged.   Sweep 7.5.   Remains on vancomycin to 2/21.   ECMO parameters: 3500 rpm Flow 4.79 L/min Pvenous -90 Delta P 33 Sweep 7.5 HFNC 5 L  ABG 7.5/37.1/173/100% LDH  534 => 488 => 547 => 461 => => 507 => 475 => 501 => 478 => 449 PTT 70 Lactate  0.9 Hgb 7.4  Objective:   Weight Range: 70.9 kg Body mass index is 26.83 kg/m.   Vital Signs:   Temp:  [98 F (36.7 C)-99 F (37.2 C)] 98.7 F (37.1 C) (02/14 0715) Pulse Rate:  [84-108] 102 (02/14 0710) Resp:  [0-39] 27 (02/14 0710) BP: (93-148)/(56-104) 141/91 (02/14 0705) SpO2:  [96 %-100 %] 100 % (02/14 0710) Arterial Line BP: (104-231)/(44-94) 167/70 (02/14 0710) Weight:  [70.9 kg] 70.9 kg (02/14 0500) Last BM Date: 04/25/20  Weight change: Filed Weights   04/24/20 0605 04/25/20 0500 04/26/20 0500  Weight: 68.1 kg 71 kg 70.9 kg    Intake/Output:   Intake/Output Summary (Last 24 hours) at 04/26/2020 0740 Last data filed at 04/26/2020 0700 Gross per 24 hour  Intake 2724.87 ml  Output 2490 ml  Net 234.87 ml      Physical Exam    General: NAD, in chair Neck: No JVD, no thyromegaly or thyroid nodule.  Lungs: Crackles at bases.  CV: Nondisplaced PMI.  Heart regular S1/S2, no S3/S4, no murmur.  No peripheral edema.   Abdomen: Soft, nontender, no hepatosplenomegaly, no distention.  Skin: Intact without lesions or rashes.  Neurologic: Alert and oriented x 3.  Psych: Normal affect. Extremities: Dry gangrene left digits HEENT: Normal.    Telemetry   NSR 100-110 Personally reviewed   Labs    CBC Recent Labs    04/25/20 1600 04/25/20 1727 04/25/20 1940 04/26/20 0450  WBC 12.9*  --   --  8.6  HGB 8.4*   < >  8.5* 7.4*  7.5*  HCT 27.5*   < > 25.0* 23.5*  22.0*  MCV 97.2  --   --  94.0  PLT 189  --   --  169   < > = values in this interval not displayed.   Basic Metabolic Panel Recent Labs    68/12/75 1600 04/25/20 1727 04/25/20 1940 04/26/20 0450  NA 142   < > 141 139  139  K 4.1   < > 3.8 3.4*  3.4*  CL 105  --   --  105  CO2 28  --   --  27  GLUCOSE 175*  --   --  119*  BUN 60*  --   --  44*  CREATININE 0.77  --   --  0.60*  CALCIUM 8.7*  --   --  8.6*   < > = values in this interval not displayed.   Liver Function Tests No results  for input(s): AST, ALT, ALKPHOS, BILITOT, PROT, ALBUMIN in the last 72 hours. No results for input(s): LIPASE, AMYLASE in the last 72 hours. Cardiac Enzymes No results for input(s): CKTOTAL, CKMB, CKMBINDEX, TROPONINI in the last 72 hours.  BNP: BNP (last 3 results) No results for input(s): BNP in the last 8760 hours.  ProBNP (last 3 results) No results for input(s): PROBNP in the last 8760 hours.   D-Dimer No results for input(s): DDIMER in the last 72 hours. Hemoglobin A1C No results for input(s): HGBA1C in the last 72 hours. Fasting Lipid Panel No results for input(s): CHOL, HDL, LDLCALC, TRIG, CHOLHDL, LDLDIRECT in the last 72 hours. Thyroid Function Tests No results for input(s): TSH, T4TOTAL, T3FREE, THYROIDAB in the last 72 hours.  Invalid input(s): FREET3  Other results:   Imaging    No results found.   Medications:     Scheduled Medications: . sodium chloride   Intravenous Once  . sodium chloride   Intravenous Once  . sodium chloride   Intravenous Once  . acetaZOLAMIDE  250 mg Per Tube Daily  . bethanechol  10 mg Per Tube TID  . carvedilol  25 mg Per Tube BID WC  . chlorhexidine  15 mL Mouth Rinse BID  . Chlorhexidine Gluconate Cloth  6 each Topical Daily  . chlorpheniramine-HYDROcodone  5 mL Per Tube Q12H  . clonazePAM  0.5 mg Per Tube Daily  . clonazePAM  1 mg Per Tube BID  . cloNIDine  0.1 mg Per Tube BID  . dextromethorphan  30 mg Per Tube TID  . docusate  100 mg Per Tube BID  . fiber  1 packet Per Tube BID  . free water  50 mL Per Tube Q4H  . furosemide  40 mg Intravenous Daily  . gabapentin  600 mg Per Tube Q8H  . insulin aspart  0-20 Units Subcutaneous Q4H  . insulin aspart  10 Units Subcutaneous Q4H  . insulin detemir  40 Units Subcutaneous BID  . ipratropium-albuterol  3 mL Nebulization BID  . lactobacillus acidophilus  2 tablet Per Tube TID  . liver oil-zinc oxide   Topical 5 X Daily  . mouth rinse  15 mL Mouth Rinse q12n4p  . melatonin   5 mg Per Tube QHS  . metoCLOPramide (REGLAN) injection  10 mg Intravenous Q8H  . nutrition supplement (JUVEN)  1 packet Per Tube BID BM  . oxyCODONE  15 mg Per Tube Q8H  . pantoprazole sodium  40 mg Per Tube QHS  . QUEtiapine  75 mg Per Tube QHS  . sennosides  10 mL Per Tube QHS  . sodium chloride flush  10-40 mL Intracatheter Q12H  . sodium chloride flush  10-40 mL Intracatheter Q12H    Infusions: . sodium chloride    . sodium chloride Stopped (04/19/20 1959)  . albumin human Stopped (04/23/20 0041)  . bivalirudin (ANGIOMAX) infusion 0.5 mg/mL (Non-ACS indications) 0.085 mg/kg/hr (04/26/20 0700)  . dextrose 80 mL/hr at 04/26/20 0700  . feeding supplement (PIVOT 1.5 CAL) 1,000 mL (04/23/20 0300)  . vancomycin Stopped (04/25/20 2140)    PRN Medications: Place/Maintain arterial line **AND** sodium chloride, acetaminophen (TYLENOL) oral liquid 160 mg/5 mL, albumin human, [DISCONTINUED] lidocaine **AND** albuterol, Gerhardt's butt cream, guaiFENesin, hydrALAZINE, labetalol, lip balm, ondansetron (ZOFRAN) IV, phenol, polyethylene glycol, promethazine-codeine, simethicone, sodium chloride flush, sodium chloride flush   Assessment/Plan   1. Acute hypoxemic respiratory failure: Due to COVID-19 PNA with bilateral infiltrates.  Refractory hypoxemia, VV-ECMO cannulation on 03/02/2020 with improvement in oxygenation.  Developed left PTX post-subclavian CVL and had left chest tube, the left lung is re-expanded and CT out.  He was extubated 1/2 but reintubated 1/4 with agitation and suspected aspiration.  Tracheostomy 1/6.  CTA chest 1/22 with suspected chronic PEs and ARDS. ECMO cannula repositioned 1/5. ECMO circuit changed 1/26. Extubated to HFNC on 2/5. LDH stable. Sweep at 7.5, have struggled with hypercarbia from significant dead space ventilation and had a set back with recurrent sepsis.  Restarted abx 2/5 with sepsis, Enterococcus faecalis, now on vancomycin.  I/Os even with IV Lasix +  acetazolamide. CXR improved today.  - PaCO2 better, start slow sweep wean => decrease to 6.5 today - On Lasix 40 mg IV daily with acetazolamide 250 once daily.  Weight up today but looks euvolemic - On vancomycin with Enterococcus.  ID has seen - Patient has had remdesivir, tocilizumab. - Completed steroid taper. - Continue bivalirudin, goal PTT 65-80.  PTT 70 Discussed dosing with PharmD personally. - Continue to mobilize => work with PT, stand and try to walk.   - Coreg continues at 25 mg bid to increase fractional flow via ECMO circuit.  - Will need to consider for lung transplantation, think we need to start thinking along this line once Enterococcal infection treated and he is doing more PT if we cannot wean sweep successfully. Will continue to wean sedation and work on ambulation. Antibodies from blood transfusion may be an issue. Limit transfusion going forward as possible,  2. 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  Clot noted on TEE in RV as well.  TTE 1/2 showed normal EF 60-65%, RV improved (mildly dilated/dysfunctional). TEE on 1/5 with moderate to severe RV dysfunction but patient was hypoxemic.  Had 1/2 dose TPA on 1/5. Echo 1/20 with mildly dilated/mildly dysfunctional RV. CTA chest 1/22 with chronic-appearing PEs in upper lobes. - PTT 70. Bivalirudin for goal PTT 65-80. Discussed dosing with PharmD personally. 3. Left PTX: Left chest tube, lung is re-expanded. Tube now out, stable CXR. 4. Shock: Suspect septic/distributive.  Now resolved, off NE.  5. Anemia: Hgb 7.4, transfuse < 7.5. Limit transfusion with transplant possibly on the horizon 6. AKI: Resolved 7. Hyperglycemia: insulin.  8. HTN: BP lower.     - Clonidine decreased to 0.1 bid and continue to wean down as able. No change today - Continue Coreg 25 mg bid.   - Suspect arterial line inaccurate, following cuff for now.  9. CHB: Episode of  CHB when  hypoxemic and with cough (suspect vagal).  NSR since then.   He has occasional short vagal episodes.  - Continue Coreg, watch rhythm.  10. Thrombocytopenia: Resolved  11. FEN: Cortrack clogged, will work on [employer] today.  12. Ischemic digits: LUE.  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  Repeat study 1/17 showed no obstruction.  - Wound Care following. 13. ID: Group F Strep and Enterococcus faecalis in sputum. Initially completed abx for these bacteria. Now with recurrent sepsis, Enterococcus faecalis in blood now. Vancomycin restarted. TEE 2/7 with no vegetation.  - Continue vancomycin to 2/21.   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   - resolved 15. Hypernatremia: Na 139. Continue free water. No change 16. Hematuria: Resolved with foley change.   CRITICAL CARE Performed by: Marca Ancona  Total critical care time: 35 minutes  Critical care time was exclusive of separately billable procedures and treating other patients.  Critical care was necessary to treat or prevent imminent or life-threatening deterioration.  Critical care was time spent personally by me on the following activities: development of treatment plan with patient and/or surrogate as well as nursing, discussions with consultants, evaluation of patient's response to treatment, examination of patient, obtaining history from patient or surrogate, ordering and performing treatments and interventions, ordering and review of laboratory studies, ordering and review of radiographic studies, pulse oximetry and re-evaluation of patient's condition.    Length of Stay: 20  Marca Ancona, MD  04/26/2020, 7:40 AM  Advanced Heart Failure Team Pager [phone] (M-F; 7a - 4p)  Please contact [facility] for night-coverage after hours (4p -7a ) and weekends on [email or web address]

## 2020-04-26 NOTE — Progress Notes (Signed)
  Speech Language Pathology Treatment: Dysphagia  Patient Details Name: Alex [name] MRN: [id number] DOB: 03/31/1970 Today's Date: 04/26/2020 Time: 5701-7793 SLP Time Calculation (min) (ACUTE ONLY): 28 min  Assessment / Plan / Recommendation Clinical Impression  Attempted FEES but Dr Alex [name] cautioned that pt is at risk of nosebleed given heavy anticoagulation. When scope was just at the opening of the nostril pt found to have bilateral nares completely obstructed with dry mucous, scabs and inflamed tissue, so study was discontinued. Discussed plan with MD, RN and ECMO specialist. All agreed that we will attempt an MBS tomorrow with a team on ECMO to determine if pt can tolerated advancing his diet. Alex [name] was eager to continue sips and bites so we trialed chocolate milk, ensure, icees and crackers. Pt did have some very delayed coughing, likely more associated with baseline coughing than any aspiration. However given fragility of pts lungs instrumental assessment is still warranted. Pt may continue small snacks with RN, including an ice cream of choice today.    HPI HPI: 50 year old man admitted to hospital 12/28 with 1 week history of dyspnea cough nausea and vomiting.  Initially admitted to [facility] long hospital and placed on high flow nasal cannula but rapidly failed and required intubation 12/29.  Persistent hypoxic respiratory failure with PF ratio 55 in spite of 18 of PEEP FiO2 0.1 despite paralytics.  Did not improve with prone ventilation. Cannulated for VV ECMO 12/30 via right IJ crescent cannula. Iatrogenic pneumothorax from left subclavian triple-lumen placement. Trach on 1/6. Decannulated 04/16/20      SLP Plan  MBS       Recommendations  Diet recommendations: Thin liquid;Dysphagia 1 (puree) Liquids provided via: Cup;Straw Medication Administration: Via alternative means Supervision: Trained caregiver to feed patient Compensations: Slow rate;Small sips/bites Postural Changes  and/or Swallow Maneuvers: Seated upright 90 degrees                Plan: MBS       GO                Alex [name], Alex [name] 04/26/2020, 11:18 AM

## 2020-04-27 ENCOUNTER — Inpatient Hospital Stay (HOSPITAL_COMMUNITY): Payer: Medicaid Other

## 2020-04-27 DIAGNOSIS — M7989 Other specified soft tissue disorders: Secondary | ICD-10-CM

## 2020-04-27 LAB — POCT I-STAT 7, (LYTES, BLD GAS, ICA,H+H)
Acid-Base Excess: 3 mmol/L — ABNORMAL HIGH (ref 0.0–2.0)
Acid-Base Excess: 3 mmol/L — ABNORMAL HIGH (ref 0.0–2.0)
Acid-Base Excess: 5 mmol/L — ABNORMAL HIGH (ref 0.0–2.0)
Acid-Base Excess: 2 mmol/L (ref 0.0–2.0)
Bicarbonate: 29.1 mmol/L — ABNORMAL HIGH (ref 20.0–28.0)
Bicarbonate: 29.2 mmol/L — ABNORMAL HIGH (ref 20.0–28.0)
Bicarbonate: 29.4 mmol/L — ABNORMAL HIGH (ref 20.0–28.0)
Bicarbonate: 29.4 mmol/L — ABNORMAL HIGH (ref 20.0–28.0)
Calcium, Ion: 1.28 mmol/L (ref 1.15–1.40)
Calcium, Ion: 1.26 mmol/L (ref 1.15–1.40)
Calcium, Ion: 1.26 mmol/L (ref 1.15–1.40)
Calcium, Ion: 1.26 mmol/L (ref 1.15–1.40)
HCT: 27 % — ABNORMAL LOW (ref 39.0–52.0)
HCT: 29 % — ABNORMAL LOW (ref 39.0–52.0)
HCT: 26 % — ABNORMAL LOW (ref 39.0–52.0)
HCT: 30 % — ABNORMAL LOW (ref 39.0–52.0)
Hemoglobin: 9.2 g/dL — ABNORMAL LOW (ref 13.0–17.0)
Hemoglobin: 9.9 g/dL — ABNORMAL LOW (ref 13.0–17.0)
Hemoglobin: 8.8 g/dL — ABNORMAL LOW (ref 13.0–17.0)
Hemoglobin: 10.2 g/dL — ABNORMAL LOW (ref 13.0–17.0)
O2 Saturation: 96 %
O2 Saturation: 98 %
O2 Saturation: 99 %
O2 Saturation: 92 %
Patient temperature: 98.8
Patient temperature: 37
Patient temperature: 37
Patient temperature: 97.6
Potassium: 4.1 mmol/L (ref 3.5–5.1)
Potassium: 3.6 mmol/L (ref 3.5–5.1)
Potassium: 3.5 mmol/L (ref 3.5–5.1)
Potassium: 4.5 mmol/L (ref 3.5–5.1)
Sodium: 135 mmol/L (ref 135–145)
Sodium: 136 mmol/L (ref 135–145)
Sodium: 137 mmol/L (ref 135–145)
Sodium: 134 mmol/L — ABNORMAL LOW (ref 135–145)
TCO2: 31 mmol/L (ref 22–32)
TCO2: 31 mmol/L (ref 22–32)
TCO2: 31 mmol/L (ref 22–32)
TCO2: 31 mmol/L (ref 22–32)
pCO2 arterial: 54.7 mmHg — ABNORMAL HIGH (ref 32.0–48.0)
pCO2 arterial: 51.8 mmHg — ABNORMAL HIGH (ref 32.0–48.0)
pCO2 arterial: 43.3 mmHg (ref 32.0–48.0)
pCO2 arterial: 58.1 mmHg — ABNORMAL HIGH (ref 32.0–48.0)
pH, Arterial: 7.334 — ABNORMAL LOW (ref 7.350–7.450)
pH, Arterial: 7.358 (ref 7.350–7.450)
pH, Arterial: 7.44 (ref 7.350–7.450)
pH, Arterial: 7.31 — ABNORMAL LOW (ref 7.350–7.450)
pO2, Arterial: 89 mmHg (ref 83.0–108.0)
pO2, Arterial: 108 mmHg (ref 83.0–108.0)
pO2, Arterial: 135 mmHg — ABNORMAL HIGH (ref 83.0–108.0)
pO2, Arterial: 70 mmHg — ABNORMAL LOW (ref 83.0–108.0)

## 2020-04-27 LAB — GLUCOSE, CAPILLARY
Glucose-Capillary: 118 mg/dL — ABNORMAL HIGH (ref 70–99)
Glucose-Capillary: 140 mg/dL — ABNORMAL HIGH (ref 70–99)
Glucose-Capillary: 147 mg/dL — ABNORMAL HIGH (ref 70–99)
Glucose-Capillary: 148 mg/dL — ABNORMAL HIGH (ref 70–99)
Glucose-Capillary: 162 mg/dL — ABNORMAL HIGH (ref 70–99)
Glucose-Capillary: 208 mg/dL — ABNORMAL HIGH (ref 70–99)
Glucose-Capillary: 51 mg/dL — ABNORMAL LOW (ref 70–99)
Glucose-Capillary: 95 mg/dL (ref 70–99)

## 2020-04-27 LAB — LACTATE DEHYDROGENASE: LDH: 495 U/L — ABNORMAL HIGH (ref 98–192)

## 2020-04-27 LAB — TYPE AND SCREEN
ABO/RH(D): B POS
Antibody Screen: NEGATIVE
Unit division: 0
Unit division: 0
Unit division: 0
Unit division: 0

## 2020-04-27 LAB — CBC
HCT: 30 % — ABNORMAL LOW (ref 39.0–52.0)
HCT: 25.6 % — ABNORMAL LOW (ref 39.0–52.0)
Hemoglobin: 9.6 g/dL — ABNORMAL LOW (ref 13.0–17.0)
Hemoglobin: 8.8 g/dL — ABNORMAL LOW (ref 13.0–17.0)
MCH: 30.3 pg (ref 26.0–34.0)
MCH: 31.1 pg (ref 26.0–34.0)
MCHC: 32 g/dL (ref 30.0–36.0)
MCHC: 34.4 g/dL (ref 30.0–36.0)
MCV: 94.6 fL (ref 80.0–100.0)
MCV: 90.5 fL (ref 80.0–100.0)
Platelets: 176 10*3/uL (ref 150–400)
Platelets: 171 10*3/uL (ref 150–400)
RBC: 3.17 MIL/uL — ABNORMAL LOW (ref 4.22–5.81)
RBC: 2.83 MIL/uL — ABNORMAL LOW (ref 4.22–5.81)
RDW: 17.5 % — ABNORMAL HIGH (ref 11.5–15.5)
RDW: 17.3 % — ABNORMAL HIGH (ref 11.5–15.5)
WBC: 10.8 10*3/uL — ABNORMAL HIGH (ref 4.0–10.5)
WBC: 8 10*3/uL (ref 4.0–10.5)
nRBC: 0.6 % — ABNORMAL HIGH (ref 0.0–0.2)
nRBC: 0 % (ref 0.0–0.2)

## 2020-04-27 LAB — BPAM RBC
Blood Product Expiration Date: 202202212359
Blood Product Expiration Date: 202202212359
Blood Product Expiration Date: 202203022359
Blood Product Expiration Date: 202203072359
ISSUE DATE / TIME: 202202140633
ISSUE DATE / TIME: 202202150920
ISSUE DATE / TIME: 202202150920
ISSUE DATE / TIME: 202202150920
Unit Type and Rh: 7300
Unit Type and Rh: 7300
Unit Type and Rh: 7300
Unit Type and Rh: 7300

## 2020-04-27 LAB — APTT
aPTT: 64 seconds — ABNORMAL HIGH (ref 24–36)
aPTT: 65 seconds — ABNORMAL HIGH (ref 24–36)

## 2020-04-27 LAB — BASIC METABOLIC PANEL
Anion gap: 8 (ref 5–15)
Anion gap: 9 (ref 5–15)
BUN: 41 mg/dL — ABNORMAL HIGH (ref 6–20)
BUN: 38 mg/dL — ABNORMAL HIGH (ref 6–20)
CO2: 26 mmol/L (ref 22–32)
CO2: 27 mmol/L (ref 22–32)
Calcium: 8.5 mg/dL — ABNORMAL LOW (ref 8.9–10.3)
Calcium: 8.4 mg/dL — ABNORMAL LOW (ref 8.9–10.3)
Chloride: 101 mmol/L (ref 98–111)
Chloride: 99 mmol/L (ref 98–111)
Creatinine, Ser: 0.63 mg/dL (ref 0.61–1.24)
Creatinine, Ser: 0.49 mg/dL — ABNORMAL LOW (ref 0.61–1.24)
GFR, Estimated: >60 mL/min (ref >60)
GFR, Estimated: >60 mL/min (ref >60)
Glucose, Bld: 124 mg/dL — ABNORMAL HIGH (ref 70–99)
Glucose, Bld: 51 mg/dL — ABNORMAL LOW (ref 70–99)
Potassium: 3.9 mmol/L (ref 3.5–5.1)
Potassium: 3.3 mmol/L — ABNORMAL LOW (ref 3.5–5.1)
Sodium: 135 mmol/L (ref 135–145)
Sodium: 135 mmol/L (ref 135–145)

## 2020-04-27 LAB — LACTIC ACID, PLASMA
Lactic Acid, Venous: 1 mmol/L (ref 0.5–1.9)
Lactic Acid, Venous: 0.7 mmol/L (ref 0.5–1.9)

## 2020-04-27 LAB — PROTIME-INR
INR: 1.5 — ABNORMAL HIGH (ref 0.8–1.2)
Prothrombin Time: 17.5 seconds — ABNORMAL HIGH (ref 11.4–15.2)

## 2020-04-27 MED ORDER — ATROPINE SULFATE 1 MG/10ML IJ SOSY
PREFILLED_SYRINGE | INTRAMUSCULAR | Status: AC
  Filled 2020-04-27: qty 10

## 2020-04-27 MED ORDER — FUROSEMIDE 10 MG/ML IJ SOLN
40.0000 mg | Freq: Two times a day (BID) | INTRAMUSCULAR | Status: DC
  Administered 2020-04-27 – 2020-05-05 (×18): 40 mg via INTRAVENOUS
  Filled 2020-04-27 (×18): qty 4

## 2020-04-27 MED ORDER — INSULIN ASPART 100 UNIT/ML ~~LOC~~ SOLN
10.0000 [IU] | Freq: Three times a day (TID) | SUBCUTANEOUS | Status: DC
  Administered 2020-04-27 – 2020-04-28 (×2): 10 [IU] via SUBCUTANEOUS

## 2020-04-27 MED ORDER — PIVOT 1.5 CAL PO LIQD
1000.0000 mL | ORAL | Status: DC
  Administered 2020-04-27 – 2020-04-29 (×3): 1000 mL

## 2020-04-27 MED ORDER — INSULIN DETEMIR 100 UNIT/ML ~~LOC~~ SOLN: 40.0000 [IU] | Freq: Every day | SUBCUTANEOUS | Status: DC

## 2020-04-27 MED ORDER — ENSURE ENLIVE PO LIQD
237.0000 mL | Freq: Three times a day (TID) | ORAL | Status: DC
  Administered 2020-04-27 – 2020-05-06 (×17): 237 mL via ORAL

## 2020-04-27 MED ORDER — POTASSIUM CHLORIDE 20 MEQ PO PACK
40.0000 meq | PACK | Freq: Once | ORAL | Status: AC
  Administered 2020-04-27: 40 meq

## 2020-04-27 MED ORDER — INSULIN DETEMIR 100 UNIT/ML ~~LOC~~ SOLN
25.0000 [IU] | Freq: Every day | SUBCUTANEOUS | Status: DC
  Administered 2020-04-27: 25 [IU] via SUBCUTANEOUS
  Filled 2020-04-27 (×2): qty 0.25

## 2020-04-27 MED ORDER — INSULIN DETEMIR 100 UNIT/ML ~~LOC~~ SOLN
40.0000 [IU] | Freq: Every day | SUBCUTANEOUS | Status: DC
  Administered 2020-04-27 – 2020-05-06 (×10): 40 [IU] via SUBCUTANEOUS
  Filled 2020-04-27 (×11): qty 0.4

## 2020-04-27 NOTE — Progress Notes (Signed)
Patient ID: Alex [name], male   DOB: 1970-08-20, 50 y.o.   MRN: [id number]     Advanced Heart Failure Rounding Note  PCP-Cardiologist: No primary care provider on file.   Subjective:    - 12/30: VV ECMO cannulation - 12/31: Left chest tube replaced - 1/2: Extubated. Echo with EF 60-65%, mildly dilated RV with mildly decreased systolic function.  - 1/4: Agitated, suspected aspiration.  Re-intubated.  - 1/5: ECMO cannula repositioned under TEE guidance. TEE showed moderately dilated/moderate-severely dysfunctional RV in setting of hypoxemia. LUE DVT found.  Patient got 1/2 dose of TPA due to initial concern for large PE.  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  - 1/6: Tracheostomy - 1/7: Echo with mild RV dilation/mild RV dysfunction.  - 1/16: Left chest tube out - 1/17: LUE arterial dopplers repeated, showed no obstruction.  - 1/20: Echo with EF 65-70%, mildly D-shaped septum, mildly dilated and mildly dysfunctional RV.  - 1/22: CTA chest: Bilateral upper lobe PEs (suspect chronic), changes c/w ARDS - 1/23: Bronchoscopy showed semi-occlusive ?mass/polyp in the trachea.  - 1/24: Bronchoscopy showed resolution of 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 - 1/26: ECMO circuit changed.  - 2/1: CT chest showed diffuse bronchiectasis as well as diffuse opacity consistent with COVID-19 PNA with ARDS. - 2/3: Trach exchange - 2/5: Decannualted to HFNC - 2/6: Enterococcal PNA/bacteremia.  Echo showed EF 65-70%, normal-appearing RV, no vegetation noted.  - 2/7: TEE with no vegetation, EF 55-60%, RV low normal function with normal size.   Able to walk to door. Talking, taking pos.  CXR improved.  Cortrack replaced.    Remains on vancomycin to 2/21.   ECMO parameters: 3500 rpm Flow 4.75 L/min Pvenous -90 Delta P 33 Sweep 6.5 HFNC 4 L  ABG 7.33/55/89/96% LDH  534 => 488 => 547 => 461 => => 507 => 475 => 501 => 478 => 449 => 495 PTT 64 Lactate 1.0 Hgb 9.6  Objective:   Weight  Range: 74.2 kg Body mass index is 28.08 kg/m.   Vital Signs:   Temp:  [97.9 F (36.6 C)-99 F (37.2 C)] 98.8 F (37.1 C) (02/15 0400) Pulse Rate:  [85-109] 96 (02/15 0700) Resp:  [9-32] 32 (02/15 0700) BP: (91-142)/(60-100) 115/75 (02/15 0600) SpO2:  [91 %-100 %] 100 % (02/15 0700) Arterial Line BP: (93-213)/(42-80) 155/67 (02/15 0700) Weight:  [74.2 kg] 74.2 kg (02/15 0620) Last BM Date: 04/27/20  Weight change: Filed Weights   04/25/20 0500 04/26/20 0500 04/27/20 0620  Weight: 71 kg 70.9 kg 74.2 kg    Intake/Output:   Intake/Output Summary (Last 24 hours) at 04/27/2020 0737 Last data filed at 04/27/2020 0700 Gross per 24 hour  Intake 3242.6 ml  Output 2505 ml  Net 737.6 ml      Physical Exam    General: NAD Neck: No JVD, no thyromegaly or thyroid nodule.  Lungs: Crackles at bases.  CV: Nondisplaced PMI.  Heart regular S1/S2, no S3/S4, no murmur.  RUE 1+ edema.    Abdomen: Soft, nontender, no hepatosplenomegaly, no distention.  Skin: Intact without lesions or rashes.  Neurologic: Alert and oriented x 3.  Psych: Normal affect. Extremities: Dry gangrene left digits  HEENT: Normal.   Telemetry   NSR 100s. Personally reviewed   Labs    CBC Recent Labs    04/26/20 1632 04/26/20 1636 04/27/20 0421 04/27/20 0622  WBC 11.8*  --  10.8*  --   HGB 9.7*   < > 9.6* 9.2*  HCT  29.2*   < > 30.0* 27.0*  MCV 93.6  --  94.6  --   PLT 190  --  176  --    < > = values in this interval not displayed.   Basic Metabolic Panel Recent Labs    23/53/61 1632 04/26/20 1636 04/27/20 0421 04/27/20 0622  NA 136   < > 135 135  K 4.1   < > 3.9 4.1  CL 101  --  101  --   CO2 27  --  26  --   GLUCOSE 231*  --  124*  --   BUN 47*  --  41*  --   CREATININE 0.66  --  0.63  --   CALCIUM 8.7*  --  8.5*  --    < > = values in this interval not displayed.   Liver Function Tests No results for input(s): AST, ALT, ALKPHOS, BILITOT, PROT, ALBUMIN in the last 72 hours. No  results for input(s): LIPASE, AMYLASE in the last 72 hours. Cardiac Enzymes No results for input(s): CKTOTAL, CKMB, CKMBINDEX, TROPONINI in the last 72 hours.  BNP: BNP (last 3 results) No results for input(s): BNP in the last 8760 hours.  ProBNP (last 3 results) No results for input(s): PROBNP in the last 8760 hours.   D-Dimer No results for input(s): DDIMER in the last 72 hours. Hemoglobin A1C No results for input(s): HGBA1C in the last 72 hours. Fasting Lipid Panel No results for input(s): CHOL, HDL, LDLCALC, TRIG, CHOLHDL, LDLDIRECT in the last 72 hours. Thyroid Function Tests No results for input(s): TSH, T4TOTAL, T3FREE, THYROIDAB in the last 72 hours.  Invalid input(s): FREET3  Other results:   Imaging    DG Abd Portable 1V  Result Date: 04/26/2020 CLINICAL DATA:  Status post feeding catheter placement EXAM: PORTABLE ABDOMEN - 1 VIEW COMPARISON:  04/16/2020 FINDINGS: Scattered large and small bowel gas is noted. Feeding catheter is noted extending into the stomach and likely into the third portion of the duodenum. No other focal abnormality is noted. ECMO catheter is again noted and stable position. IMPRESSION: Feeding catheter extending into the stomach and likely into the third portion of the duodenum. Contrast injection might be helpful for confirmation. Electronically Signed   By: Alcide Clever M.D.   On: 04/26/2020 11:38     Medications:     Scheduled Medications: . sodium chloride   Intravenous Once  . sodium chloride   Intravenous Once  . acetaZOLAMIDE  250 mg Per Tube Daily  . bethanechol  10 mg Per Tube TID  . carvedilol  25 mg Per Tube BID WC  . chlorhexidine  15 mL Mouth Rinse BID  . Chlorhexidine Gluconate Cloth  6 each Topical Daily  . chlorpheniramine-HYDROcodone  5 mL Per Tube Q12H  . clonazePAM  0.5 mg Per Tube Daily  . clonazePAM  1 mg Per Tube BID  . cloNIDine  0.1 mg Per Tube BID  . dextromethorphan  30 mg Per Tube TID  . docusate  100 mg  Per Tube BID  . doxazosin  2 mg Per Tube Daily  . fiber  1 packet Per Tube BID  . free water  50 mL Per Tube Q4H  . furosemide  40 mg Intravenous BID  . gabapentin  400 mg Per Tube Q8H  . insulin aspart  0-20 Units Subcutaneous Q4H  . insulin aspart  10 Units Subcutaneous Q4H  . insulin detemir  40 Units Subcutaneous BID  . ipratropium-albuterol  3 mL Nebulization BID  . lactobacillus  1 g Per Tube TID WC  . liver oil-zinc oxide   Topical 5 X Daily  . mouth rinse  15 mL Mouth Rinse q12n4p  . melatonin  5 mg Per Tube QHS  . metoCLOPramide (REGLAN) injection  10 mg Intravenous Q8H  . nutrition supplement (JUVEN)  1 packet Per Tube BID BM  . oxyCODONE  15 mg Per Tube Q8H  . pantoprazole sodium  40 mg Per Tube QHS  . QUEtiapine  25 mg Per Tube QHS  . sennosides  10 mL Per Tube QHS  . sodium chloride flush  10-40 mL Intracatheter Q12H  . sodium chloride flush  10-40 mL Intracatheter Q12H    Infusions: . sodium chloride    . sodium chloride Stopped (04/19/20 1959)  . albumin human Stopped (04/23/20 0041)  . bivalirudin (ANGIOMAX) infusion 0.5 mg/mL (Non-ACS indications) 0.085 mg/kg/hr (04/27/20 0700)  . feeding supplement (PIVOT 1.5 CAL) 1,000 mL (04/26/20 1311)  . vancomycin 1,000 mg (04/27/20 0732)    PRN Medications: Place/Maintain arterial line **AND** sodium chloride, acetaminophen (TYLENOL) oral liquid 160 mg/5 mL, albumin human, [DISCONTINUED] lidocaine **AND** albuterol, Gerhardt's butt cream, guaiFENesin, hydrALAZINE, labetalol, lip balm, ondansetron (ZOFRAN) IV, phenol, polyethylene glycol, promethazine-codeine, simethicone, sodium chloride, sodium chloride flush, sodium chloride flush   Assessment/Plan   1. Acute hypoxemic respiratory failure: Due to COVID-19 PNA with bilateral infiltrates.  Refractory hypoxemia, VV-ECMO cannulation on 02/29/2020 with improvement in oxygenation.  Developed left PTX post-subclavian CVL and had left chest tube, the left lung is re-expanded and  CT out.  He was extubated 1/2 but reintubated 1/4 with agitation and suspected aspiration.  Tracheostomy 1/6.  CTA chest 1/22 with suspected chronic PEs and ARDS. ECMO cannula repositioned 1/5. ECMO circuit changed 1/26. Extubated to HFNC on 2/5. LDH stable. Sweep at 6.5, have struggled with hypercarbia from significant dead space ventilation and had a set back with recurrent sepsis but now improving.  Restarted abx 2/5 with sepsis, Enterococcus faecalis, now on vancomycin.  I/Os positive yesterday with IV Lasix + acetazolamide.  - Continue slow sweep wean => decrease to 5.5 today - Continue acetazolamide 250 and will make Lasix 40 mg IV bid.  - On vancomycin with Enterococcus.  ID has seen - Patient has had remdesivir, tocilizumab. - Completed steroid taper. - Continue bivalirudin, goal PTT 65-80.  PTT 64. Discussed dosing with PharmD personally. - Continue to mobilize => work with PT, doing some walking. - Coreg continues at 25 mg bid to increase fractional flow via ECMO circuit.  - Will need to consider for lung transplantation, think we need to start thinking along this line once Enterococcal infection treated and he is doing more PT if we cannot wean sweep successfully. Will continue to wean sedation and work on ambulation. Antibodies from blood transfusion may be an issue. Limit transfusion going forward as possible,  2. 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  Clot noted on TEE in RV as well.  TTE 1/2 showed normal EF 60-65%, RV improved (mildly dilated/dysfunctional). TEE on 1/5 with moderate to severe RV dysfunction but patient was hypoxemic.  Had 1/2 dose TPA on 1/5. Echo 1/20 with mildly dilated/mildly dysfunctional RV. CTA chest 1/22 with chronic-appearing PEs in upper lobes. - PTT 64. Bivalirudin for goal PTT 65-80. Discussed dosing with PharmD personally. 3. Left PTX: Left chest tube, lung is re-expanded. Tube now out, stable  CXR. 4. Shock: Suspect septic/distributive.  Now resolved, off NE.  5. Anemia: Hgb 9.6, transfuse < 7.5. Limit transfusion with transplant possibly on the horizon 6. AKI: Resolved 7. Hyperglycemia: insulin.  8. HTN: BP lower.     - Clonidine decreased to 0.1 bid and continue to wean down as able. No change today - Continue Coreg 25 mg bid.   - Suspect arterial line inaccurate, following cuff for now.  9. CHB: Episode of CHB when hypoxemic and with cough (suspect vagal).  NSR since then.   He has occasional short vagal episodes.  - Continue Coreg, watch rhythm.  10. Thrombocytopenia: Resolved  11. FEN: Cortrack clogged, will work on [employer] today.  12. Ischemic digits: LUE.  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  Repeat study 1/17 showed no obstruction.  - Wound Care following. 13. ID: Group F Strep and Enterococcus faecalis in sputum. Initially completed abx for these bacteria. Now with recurrent sepsis, Enterococcus faecalis in blood now. Vancomycin restarted. TEE 2/7 with no vegetation.  - Continue vancomycin to 2/21.   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   - resolved 15. Hypernatremia: Resolved. Continue free water. No change 16. Hematuria: Resolved with foley change.   CRITICAL CARE Performed by: Marca Ancona  Total critical care time: 35 minutes  Critical care time was exclusive of separately billable procedures and treating other patients.  Critical care was necessary to treat or prevent imminent or life-threatening deterioration.  Critical care was time spent personally by me on the following activities: development of treatment plan with patient and/or surrogate as well as nursing, discussions with consultants, evaluation of patient's response to treatment, examination of patient, obtaining history from patient or surrogate, ordering and performing  treatments and interventions, ordering and review of laboratory studies, ordering and review of radiographic studies, pulse oximetry and re-evaluation of patient's condition.    Length of Stay: 37  Marca Ancona, MD  04/27/2020, 7:37 AM  Advanced Heart Failure Team Pager [phone] (M-F; 7a - 4p)  Please contact [facility] for night-coverage after hours (4p -7a ) and weekends on [email or web address]

## 2020-04-27 NOTE — Progress Notes (Signed)
ECMO PROGRESS NOTE  NAME:  Alex [name], MRN:  [id number], DOB:  1971/02/09, LOS: 49 ADMISSION DATE:  03-19-20, CONSULTATION DATE: 02/21/2020 REFERRING MD: Wynona Neat -LBPCCM, CHIEF COMPLAINT: Respiratory failure requiring ECMO  HPI/course in hospital  50 year old man admitted to hospital 12/28 with 1 week history of dyspnea cough nausea and vomiting.  Initially admitted to [facility] long hospital and placed on high flow nasal cannula but rapidly failed and required intubation 12/29.  Persistent hypoxic respiratory failure with PF ratio 55 in spite of 18 of PEEP FiO2 0.1 despite paralytics.  Did not improve with prone ventilation  Cannulated for VV ECMO 12/30 via right IJ crescent cannula.  ECMO circuit was changed on 04/07/2020 Iatrogenic pneumothorax from left subclavian triple-lumen placement  12/28 admitted covid, ARDS 12/30 ECMO cannulation, iatrogenic pneumothrorax, DVT, Bivalrudin started           Left subclavian hematoma, chest tube, ceftriaxone/ azithromycin started 12/31 1Unit PRBC 1/1 Palliative consult, noted HTN, fentanyl only 1/2 Extubation I/O positive, left lung re-expanded, EF 60-65%, Lasix 20mg  BID 1/3 BiPAP in place, HTN to 190s, 200s, labetolol for BP, I/O even, cefepime and vancomycin started 1/4 agitation off BiPAP, possible aspiration 1/5 agitation overnight, reintubated      ECHO with RV dilation, ECMO cannula repositioned, LUE DVT (+), 1/2 dose tPA given,      I/O up, on lasix, Epi, NE, vasopressin started, HR/ rhythm issues noted 1/6 Tracheostomy, hypoglycemic when off tube feeds       Cortrack tune issues 1/7 on lasix gtt 4mg /hr with diuresis, agitation improved 1/8 starting steroid taper 1/9 severe agitation, heavy sedation required, lasix gtt 6mg /hr, I/Os (-)       precedex and fentanyl 1/10 Vanc stopped 1/11 sweep to 2, lasix gtt at 4mg /hr, weight up, metoprolol added for HTN 1/12 fevers to 99 noted, lasix gtt and metazolone 1/13 lasix gtt dced 1/14  intermittent HTN, possibly flash pulmonary edema tied to sedation        Ischemic left hand changes, 1 Unit PRBCs 1/15 1/16 sweep from 4 to 2.5, chest tube removed 1/17 sweep at 6, trial of nebulized morphine for cough, LUE dopplers show no obstruction 1/18 two events of 2nd degree type II HB noted with coughing         sweep at 4, IV lasix 40, acetazolamide, distal XLT trach placement         agitation and BP spikes 1/19 agitation, air hunger, seroquel, klonopin, oxy, valproate, ketamine trial 1/20 sweep at 3.5, diuresis results in chugging, albumin given         1 Unit PRBCs, lidocaine nebulizers for cough, ancef started 1/21 sweep to 8, anxiety, I/Os (+) with lasix and acetazolamide 1/22 sweep at 5, chest CT bilateral upper lobe PEs 1/23 sweep at 5, delirium, I/Os even with lasix and acetazolamide         continued coughing, 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 1/24 sweep at 7.5, 1 Unit PRBCs, repeat bronchoscopy showed resolution of 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 1/25 sweep at 7.5, on propofol, lasix gtt at 37ml/hr, I/Os (+)         nebulized morphine and lidocaine for cough 1/26 sweep at 8, propofol/ precedex, cough present when weaned, lasix gtt at 67ml/hr        ECMO circuit changes, levophed started, 1 Unit PRBCs 1/27 sweep at 4, off sedation, delirium, I/Os even        lasix gtt restated, acetazolamide overnight 1/28 sweep at 7.5, HTN  labile 1/29 sweep of 6, patient reports a tickle in throat, cetacaine 1/30 sweep down to 6, cough improved with cetacaine and gabapentin 1/31 sweep at 5, agitation, off acetazolamide, lasix, weight up 2/1 cough, of pulmicort, cetacaine for cough, continued gabapentin, dexmethorphan       CT demonstrated trach placement against back of trachea 2/2 Trach collar trial somewhat effective in managing cough       low dose diltiazem for HR,  2/3 sweep at 4,trach cannula replacement (longer, more flexible bivona); brochoscopy shows  irritated mucosa       IV lasix and acetazolamide 2/4 sweep at 5, I/Os (+), BUN elevated, lasix gtt with diamox,       trach decannulation, increased gabapentin for cough 2/5 start HCAP coverage for rising WBC/temps, check Pct/cultures, CXR worse with lack of PEEP, diuresed well but started getting very hypotensive, sepsis vs. Too dry 04/18/20 blood cx grew E faecalis 2/7 PICC change, TEE  Past Medical History  none  Interim history/subjective:  No events, Walked across room yesterday Some left hand pain R arm more swollen from BP cuff  Objective   Blood pressure 126/80, pulse (!) 101, temperature 98.6 F (37 C), temperature source Axillary, resp. rate 20, height 5\' 4"  (1.626 m), weight 74.2 kg, SpO2 98 %.        Intake/Output Summary (Last 24 hours) at 04/27/2020 0847 Last data filed at 04/27/2020 0700 Gross per 24 hour  Intake 3138.67 ml  Output 2400 ml  Net 738.67 ml   Filed Weights   04/25/20 0500 04/26/20 0500 04/27/20 04/29/20  Weight: 71 kg 70.9 kg 74.2 kg    Examination: Constitutional: no acute distress sitting in chair  Eyes: EOMI, pupils equal Ears, nose, mouth, and throat: MMM, trach stoma dressed Cardiovascular: slightly tachycardic, ext warm Respiratory: shallow inspiratory efforts, no wheezing Gastrointestinal: slightly distended, +BS Skin: No rashes, normal turgor, stable L hand ischemic changes Neurologic: moves all 4 ext to command Psychiatric: RASS 0, in good spirits  CBG good Net +0.7L CBC stable LDH stable CXR stable diffuse airspace disease, slightly better aeration, R loculated effusion a bit more conspicuous  Assessment & Plan:  Acute hypoxemic/hypercapnic respiratory failure due to severe ARDS from COVID-19 pneumonia Probable acute PE, and RV dysfunction s/p TPA Refractory coughing- improved after trach removal but still an issue Strep group F/ enterococcal pneumonia- s/p 10 days vanc 1/29; recurrent with bacteremia on 2/5. - Continue VV ECMO,  sweep weans titrated to WOB/ABG - Cough regimen as ordered - Continue supplemental oxygen via nasal cannula, can decrease as his oxygen improves. - IS, flutter, OOB mobility to prevent muscle atrophy - [facility] lung transplant has been contacted and is reviewing his case, Dr. 2/29 is managing this process - Keep even with diuresis: double lasix dose today  HTN.  Trials of reducing HR/BP have resulted in reduced pCO2 (likely related to fractional flow through ECMO circuit) -Continue coreg, clonidine at current doses for now  Acute delirium, with agitation- improved. -RASS goal -1 to 0, PRN, morphine PRN, oxycodone 15mg > now q8h, qHS seroquel, klonipin 1mg  TID -Plans to slowly wean this over next couple weeks: keep the same today  Sepsis secondary to E faecalis bacteremia- secondary to PNA, has grown enterococcus in sputum prior, previous vanc course completed 1/29. TEE on 2/7 without vegetations. PICC changed on 2/7. -Continue vanc through 2/21 per ID  Gastroparesis with some element ileus: improved but occasional vomiting spells with coughing attacks.  Stable - Reglan back to 10mg   Q8h on 2/11. 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  - wound care following, appreciate dressing recommendations - surgical consult for L hand once more stable - keep A-line out of left arm   R arm swelling- hopefully just from BP cuff, check RUE duplex  Hyperglycemia due to diabetes type II -Basal bolus insulin as ordered, goal CBG 100-180, looks good today  Acute urinary retention; no evidence of UTI based on UA Hematuria -failed condom cath trial, foley replaced 2/7; again on 2/11 -increase mobility -continue cardura - rechallenge with voiding trial 2/18  Deconditioning -con't working with PT, OT, SLP   Daily Goals Checklist  Pain/Anxiety/Delirium protocol (if  indicated): see above VAP protocol (if indicated):  n/a DVT prophylaxis: bivalirudin GI prophylaxis: pantoprazole Glucose control: basal bolus insulin Mobility/therapy needs: Mobilization as tolerated Code Status: Full code Disposition: ICU  Patient critically ill due to ARDS Interventions to address this today ECMO titration Risk of deterioration without these interventions is high  I personally spent 33 minutes providing critical care not including any separately billable procedures  Myrla Halsted MD  Pulmonary Critical Care 04/12/2020 7:23 AM Prefer epic messenger for cross cover needs If after hours, please call E-link

## 2020-04-27 NOTE — Progress Notes (Signed)
ECMO and vent patient transported to CT and returned to 2H24 without complications by: Gar Gibbon, RN, ECMO Specialist Devota Pace, RN, ECMO Specialist, ECMO Coordinator Kinnie Feil, RN  Prior to transport all emergency drugs and equipment prepared for the trip, circuit battery verified, oxygen tubing connected to a full O2 tank set to 6L of sweep, circuit heater turned off and heater tubing clamped.  When patient returned to 2H24, the circuit was connected back to a red wall power outlet, the heater was turned back on and set to 37 degrees, the oxygen hose was connected back to the wall O2 outlet, and the oxygen tubing connected back to the circuit flow meter at 6.5L of sweep. Once the heater warmed to 37 degrees the heater tubing was unclamped.

## 2020-04-27 NOTE — Progress Notes (Signed)
ANTICOAGULATION CONSULT NOTE  Pharmacy Consult for bivalirudin Indication: ECMO and VTE  Labs: Recent Labs    04/25/20 0403 04/25/20 0413 04/26/20 0450 04/26/20 0839 04/26/20 1632 04/26/20 1636 04/26/20 2230 04/27/20 0421 04/27/20 0622  HGB 8.5*   < > 7.4*  7.5*   < > 9.7*   < > 9.9* 9.6* 9.2*  HCT 25.6*   < > 23.5*  22.0*   < > 29.2*   < > 29.0* 30.0* 27.0*  PLT 184   < > 169  --  190  --   --  176  --   APTT 63*   < > 70*  --  63*  --   --  64*  --   LABPROT 17.8*  --  18.5*  --   --   --   --  17.5*  --   INR 1.5*  --  1.6*  --   --   --   --  1.5*  --   CREATININE 0.73   < > 0.60*  --  0.66  --   --  0.63  --    < > = values in this interval not displayed.    Assessment: 43 yoM admitted with COVID-19 PNA with worsening hypoxia, s/p cannulation for ECMO. Pt was started on IV heparin prior to cannulation due to acute DVTs and possible PE, transitioned to bivalirudin with ECMO. Now s/p tPA on 1/5 and tracheostomy on 1/6. Circuit last changed 1/26.  aPTT is therapeutic at 64 sec. No bleeding reported. LDH stable 400s. Hgb 9s. Patient in chair talking with RN.   Goal of Therapy:  aPTT 60-80 seconds   Plan:  -Continue bivalirudin at 0.085 mg/kg/hr (using order-specific wt 72.1kg) -Monitor q12h aPTT/CBC, LDH, and for s/sx of bleeding  Sheppard Coil PharmD., BCPS Clinical Pharmacist 04/27/2020 7:32 AM

## 2020-04-27 NOTE — Plan of Care (Signed)
  Problem: Health Behavior/Discharge Planning: Goal: Ability to manage health-related needs will improve Outcome: Progressing   Problem: Clinical Measurements: Goal: Ability to maintain clinical measurements within normal limits will improve Outcome: Progressing Goal: Will remain free from infection Outcome: Progressing Goal: Diagnostic test results will improve Outcome: Progressing Goal: Respiratory complications will improve Outcome: Progressing Goal: Cardiovascular complication will be avoided Outcome: Progressing   Problem: Activity: Goal: Risk for activity intolerance will decrease Outcome: Progressing   Problem: Coping: Goal: Level of anxiety will decrease Outcome: Progressing   Problem: Pain Managment: Goal: General experience of comfort will improve Outcome: Progressing   Problem: Elimination: Goal: Will not experience complications related to bowel motility Outcome: Progressing Goal: Will not experience complications related to urinary retention Outcome: Progressing   Problem: Safety: Goal: Ability to remain free from injury will improve Outcome: Progressing   Problem: Skin Integrity: Goal: Risk for impaired skin integrity will decrease Outcome: Progressing   Problem: Education: Goal: Knowledge of risk factors and measures for prevention of condition will improve Outcome: Progressing   Problem: Coping: Goal: Psychosocial and spiritual needs will be supported Outcome: Progressing   Problem: Respiratory: Goal: Will maintain a patent airway Outcome: Progressing Goal: Complications related to the disease process, condition or treatment will be avoided or minimized Outcome: Progressing   Problem: Activity: Goal: Ability to tolerate increased activity will improve Outcome: Progressing   Problem: Respiratory: Goal: Ability to maintain a clear airway and adequate ventilation will improve Outcome: Progressing   Problem: Role Relationship: Goal: Method  of communication will improve Outcome: Progressing   

## 2020-04-27 NOTE — Procedures (Signed)
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   Configuration: Venovenous  Drainage cannula: 32 French crescent cannula via right IJ Return cannula: Same  Pump speed: 3500 RPM Pump flow: 4.81 L/min Pump used: Cardio help  Oxygenator: Cardio help O2 blender: 100% Sweep gas: 6.5L  Circuit check: minimal thrombin in circuit, no clots, unchanged Anticoagulant: Bivalirudin Anticoagulation targets: PTT 60-80  Changes in support: continue mobility as tolerated to prevent muscle atrophy, slowly wean down on sedating meds, wean sweep as tolerated by WOB and ABG  Anticipated goals/duration of support: Bridge to recovery vs transplant. Ongoing communication with [facility] transplant (Dr. Chestine Spore coordinating, appreciate help).  Multidisciplinary ECMO rounds completed.  Lorin Glass, MD 04/27/20 9:11 AM [facility]

## 2020-04-27 NOTE — Progress Notes (Signed)
Modified Barium Swallow Progress Note  Patient Details  Name: Alex [name] MRN: [id number] Date of Birth: 05-20-1970  Today's Date: 04/27/2020  Modified Barium Swallow completed.  Full report located under Chart Review in the Imaging Section.  Brief recommendations include the following:  Clinical Impression  Pt demonstrates mild oropharyngeal dysphagia with mild lethargy note today during study. He was able to orally manipulate all POs, but when masticating solid he needed cues to sustain attention and started drifting off to sleep. With verbal reminders he completed mastication. Pt also struggled to coordinate pill with liquids; pill lodged briefly in valleculae. Pharyngeal function characterized by adequate strength, but instances of slightly delayed swallow initiation resulting in some flash and flash frank penetration. Even with pressed with large quantities with rapid intake, no aspiration occurred. Pt incidentally not coughing during assessment. Esophageal sweep appeared WNL. Recommend pt initaite thin liquids and mech soft solids. Meds crushed in puree. Pt can trial regular texture pleasure foods if he tolerates mech soft and is fully alert.   Swallow Evaluation Recommendations       SLP Diet Recommendations: Dysphagia 3 (Mech soft) solids;Thin liquid   Liquid Administration via: Straw;Cup   Medication Administration: Crushed with puree       Compensations: Slow rate;Small sips/bites   Postural Changes: Seated upright at 90 degrees   Oral Care Recommendations: Oral care BID   Other Recommendations: Have oral suction available   Harlon Ditty, MA CCC-SLP  Acute Rehabilitation Services Pager [phone] Office [phone] Prefer Secure Chat messaging  Claudine Mouton 04/27/2020,11:19 AM

## 2020-04-27 NOTE — Progress Notes (Signed)
Nutrition Follow-up  DOCUMENTATION CODES:   Not applicable  INTERVENTION:   Ensure Enlive po TID, each supplement provides 350 kcal and 20 grams of protein  Tube Feeding via Cortrak: Transition to Nocturnal TF Pivot 1.5 at 80 ml/hr x 14 hours Provies 1680 kcals, 105 g of protein, 851 ml of free water Meets <75% of estimated minimum needs   NUTRITION DIAGNOSIS:   Increased nutrient needs related to acute illness,catabolic illness (COVID-19 infection) as evidenced by estimated needs.  Being addressed via TF   GOAL:   Patient will meet greater than or equal to 90% of their needs  Progressing  MONITOR:   Vent status,TF tolerance,Labs,Weight trends  REASON FOR ASSESSMENT:   LOS Enteral/tube feeding initiation and management  ASSESSMENT:   50 y.o. male with no significant medical history. He presented to the ED with dyspnea, cough, and N/V. He reported that his symptoms began with cough 1 week ago. He tried OTC meds but nothing helped. Symptoms worsened to include body aches, fatigue, and N/V 3 days later. He went to his PCP 12/26 and got tested for COVID; he was positive.  12/26 COVID+  12/28 Admitted to [facility] 12/29 Intubated 12/30 Transferred to [facility], VV ECMO cannulation, L PTX with Chest tube insertion 12/31 Cortrak placed, Post-pyloric  1/02 Extubated to HFNC/BiPap as needed, ECHO with EF 60-65% 1/06 TEE for ECMO cannula position, Re-Intubated, Rhinorockets placed for epistaxis, Cortrak malpositioned-repositioned and now gastric per xray 1/07 Trach placed 1/14 Cortrak advanced to post pyloric position 1/16 L. Chest tube removed 1/22 CT chest: bilateral UL PE 1/23 Bronch showed semi occlusive ?mass/polyp in trachea 1/24 Bronch showed resolution of 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 1/26 ECMO Circuit exchanged 2/03 Trach exchange 2/05 Extubated to HFNC 2/07 TEE with no vegetation, EF 55-60% 2/14 Cortrak replaced, post-pyloric  Pt remains on VV ECMO Able to walk to door Diet advanced  to Dysphagia 3 today. Pt very motivated to eat. Pt likes Ensure, plan to add TID between meals  Pivot 1.5 at 70 ml/hr via Cortrak Plan to transition to nocturnal TF today to promote po   Labs: reviewed Meds: reglan, lasix, ss novolog, levemir, novolog    Diet Order:   Diet Order            Diet NPO time specified Except for: Ice Chips, Other (See Comments)  Diet effective now                 EDUCATION NEEDS:   Not appropriate for education at this time  Skin:  Skin Assessment: Skin Integrity Issues: Skin Integrity Issues:: Stage II,Other (Comment) DTI: buttocks, forhead Stage II: anus, coccyx Other: ischemic changes to LUE, large bulla on L hand that ruptured  Last BM:  2/8 rectal tube re-inserted  Height:   Ht Readings from Last 1 Encounters:  02/27/2020 5\' 4"  (1.626 m)    Weight:   Wt Readings from Last 1 Encounters:  04/27/20 74.2 kg    Ideal Body Weight:  59.1 kg  BMI:  Body mass index is 28.08 kg/m.  Estimated Nutritional Needs:   Kcal:  2160-2520 kcals  Protein:  130-150 g  Fluid:  >/= 2 L   05-30-1992 MS, RDN, LDN, CNSC Registered Dietitian III Clinical Nutrition RD Pager and On-Call Pager Number Located in [place]

## 2020-04-27 NOTE — Progress Notes (Signed)
ANTICOAGULATION CONSULT NOTE  Pharmacy Consult for bivalirudin Indication: ECMO and VTE  Labs: Recent Labs    04/25/20 0403 04/25/20 0413 04/26/20 0450 04/26/20 0839 04/26/20 1632 04/26/20 1636 04/27/20 0421 04/27/20 0622 04/27/20 1202 04/27/20 1649 04/27/20 1654  HGB 8.5*   < > 7.4*  7.5*   < > 9.7*   < > 9.6*   < > 9.9* 8.8* 8.8*  HCT 25.6*   < > 23.5*  22.0*   < > 29.2*   < > 30.0*   < > 29.0* 25.6* 26.0*  PLT 184   < > 169  --  190  --  176  --   --  171  --   APTT 63*   < > 70*  --  63*  --  64*  --   --  65*  --   LABPROT 17.8*  --  18.5*  --   --   --  17.5*  --   --   --   --   INR 1.5*  --  1.6*  --   --   --  1.5*  --   --   --   --   CREATININE 0.73   < > 0.60*  --  0.66  --  0.63  --   --  0.49*  --    < > = values in this interval not displayed.    Assessment: 62 yoM admitted with COVID-19 PNA with worsening hypoxia, s/p cannulation for ECMO. Pt was started on IV heparin prior to cannulation due to acute DVTs and possible PE, transitioned to bivalirudin with ECMO. Now s/p tPA on 1/5 and tracheostomy on 1/6. Circuit last changed 1/26.  aPTT is therapeutic at 65 sec. No bleeding reported. LDH stable 400s. CBC stable.  Goal of Therapy:  aPTT 60-80 seconds   Plan:  -Continue bivalirudin at 0.085 mg/kg/hr (using order-specific wt 72.1kg) -Monitor q12h aPTT/CBC, LDH, and for s/sx of bleeding   Leia Alf, PharmD, BCPS Please check AMION for all [facility] contact numbers Clinical Pharmacist 04/27/2020 5:47 PM

## 2020-04-27 NOTE — Progress Notes (Signed)
Physical Therapy Treatment Patient Details Name: Alex [name] MRN: [id number] DOB: [birth year] Today's Date: 04/27/2020    History of Present Illness Pt is 50 y.o. male  with no significant past medical history admitted on 03/06/2020 with dyspnea, cough, nausea/vomiting ~ 1 week ago with worsening symptoms of body aches and fatigue and +COVID 02/07/20 and admitted with shortness of breath. Required intubation 03/10/20. Cannulated for VV ECMO March 24, 2020. Oxygenating better with ECMO. Also evidence of RLE DVT, RV thrombus, and high suspicion of PE. Has required chest tube to L lung for collapse which needs further reposition with recurrent collapse. Extubated 03-15-19.  Reintubated 1/5 and trach placed 1/6. Decannulated 04/16/20.    PT Comments    Pt continues to have high motivation for ambulation today despite having already had MBS and looking visibly fatigued at start of session. Pt reports BM, and requires maxA to come to standing for pericare. Pt able to stand for approx 4 min while cleaned, medications applied and fresh linens placed. Pt sat back down and rested while recliner positioned for maximal progression with ambulation. Pt able to perform 2 bouts of ambulation first with PT support anteriorly at waist, however pt with decreased ability to weightshift and rotate pelvis for LE advancement. With second attempt pt able to progress to 10 feet with support at pelvis to facilitate rotation and weight shift. Pt with increased L finger pain with attempt to grasp RW with L hand. PT to bring L platform walker to decrease weightbearing through L hand. Spoke with pulmonologist who is in touch with [facility] transplant team and indicates that pt will need to be able to walk approximately 1000 feet for consideration for transplant. Therapy team will work with nursing and ECMO team to facilitate pt mobility through functional movement for self care, strengthening, balance and ambulation.     Follow Up  Recommendations  CIR;Supervision/Assistance - 24 hour     Equipment Recommendations  Other (comment) (TBD (pt still on ECMO))    Recommendations for Other Services       Precautions / Restrictions Precautions Precautions: Fall Precaution Comments: ECMO, NGtube, Foley, Pt decannulated himself 2/4 Splint/Cast:  (B Prevalon boots)    Mobility  Bed Mobility               General bed mobility comments: sitting in chair    Transfers Overall transfer level: Needs assistance Equipment used: Rolling walker (2 wheeled) Transfers: Sit to/from [employer] Sit to Stand: +2 physical assistance;+2 safety/equipment;Max assist (3rd person for lines)         General transfer comment: maxA for power up into standing, increased cuing for hand placement, pelvic rotation and upright posture, pt able to stand for 4 min for pericare with mod-maxA from therapist, by end pt resting head on therapist shoulder, pt able to stand twice more for ambulation, assist from front x 1 and from rear x1  Ambulation/Gait Ambulation/Gait assistance: Total assist;Max assist;+2 physical assistance;+2 safety/equipment Gait Distance (Feet): 15 Feet (1x5, 1x 10) Assistive device: Rolling walker (2 wheeled) Gait Pattern/deviations: Step-to pattern;Decreased step length - right;Decreased step length - left;Decreased dorsiflexion - right;Decreased dorsiflexion - left;Decreased weight shift to right;Decreased weight shift to left;Narrow base of support Gait velocity: slowed Gait velocity interpretation: <1.31 ft/sec, indicative of household ambulator General Gait Details: pt with 2 bouts of walking today, 5 feet followed by 10 feet, on first walk PT provided assist anteriorly at waist with gait belt, vc for upright posture, and sequencing, with second bout therapist  provided assist at hips to aid in pelvic rotation to advance feet and pt able ambulate greater distance, assist of 3 required for ECMO and  lines and leads         Balance Overall balance assessment: Needs assistance Sitting-balance support: Feet supported;Bilateral upper extremity supported Sitting balance-Leahy Scale: Poor Sitting balance - Comments: requires UE support but able to sit at edge of recliner to facilitate core activation   Standing balance support: Bilateral upper extremity supported;During functional activity Standing balance-Leahy Scale: Zero Standing balance comment: requires max outside support to maintain blance                            Cognition Arousal/Alertness: Awake/alert Behavior During Therapy: Flat affect                                   General Comments: pt with increased fatigue today at during session, s/p MBS, pt provided increased cuing for mobility         General Comments General comments (skin integrity, edema, etc.): increased O2 to 10L/min via [place] to maximize oxygenation for activity      Pertinent Vitals/Pain Pain Assessment: Faces Faces Pain Scale: Hurts whole lot Pain Location: L fingers with attempt to hold onto RW for ambulation Pain Descriptors / Indicators: Discomfort;Grimacing;Guarding Pain Intervention(s): Limited activity within patient's tolerance;Monitored during session;Repositioned (will bring platform walker to decrease pressure on L hand)           PT Goals (current goals can now be found in the care plan section) Acute Rehab PT Goals Patient Stated Goal: unable to state PT Goal Formulation: With patient Time For Goal Achievement: 05/10/20 Potential to Achieve Goals: Fair Progress towards PT goals: Progressing toward goals    Frequency    Min 3X/week      PT Plan Current plan remains appropriate       AM-PAC PT "6 Clicks" Mobility   Outcome Measure  Help needed turning from your back to your side while in a flat bed without using bedrails?: Total Help needed moving from lying on your back to sitting on the  side of a flat bed without using bedrails?: Total Help needed moving to and from a bed to a chair (including a wheelchair)?: Total Help needed standing up from a chair using your arms (e.g., wheelchair or bedside chair)?: Total Help needed to walk in hospital room?: Total Help needed climbing 3-5 steps with a railing? : Total 6 Click Score: 6    End of Session Equipment Utilized During Treatment: Gait belt Activity Tolerance: Patient tolerated treatment well Patient left: in bed;with nursing/sitter in room;with call bell/phone within reach Nurse Communication: Mobility status PT Visit Diagnosis: Muscle weakness (generalized) (M62.81);Difficulty in walking, not elsewhere classified (R26.2);Other abnormalities of gait and mobility (R26.89);Unsteadiness on feet (R26.81)     Time: 3382-5053 PT Time Calculation (min) (ACUTE ONLY): 47 min  Charges:  $Gait Training: 23-37 mins $Therapeutic Exercise: 8-22 mins                     Tashi Andujo B. Beverely Risen PT, DPT Acute Rehabilitation Services Pager [phone] Office [phone]    Elon Alas [facility] 04/27/2020, 5:45 PM

## 2020-04-27 NOTE — Progress Notes (Signed)
Right upper Extremity venous US completed.     Please see CV Proc for preliminary results.   Clint Guy, RVT

## 2020-04-27 NOTE — Progress Notes (Signed)
Pharmacy Antibiotic Note  Alex [name] is a 50 y.o. male on ECMO. Pharmacy consulted to dose vancomycin for treatment of enterococcal bacteremia. TEE showed no vegetation. Treating nosocomial enterococcus faecalis bacteremia. 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 Vancomycin tentatively planned through 2/21.  Tmax 99, wbc 10.8, scr normal at 0.63. Stop date entered for abx of next week. For MBS today.   Plan: -Continue vancomycin 1000mg  IV q12h -Follow Cr daily -Recheck VT in am   Height: 5\' 4"  (162.6 cm) Weight: 74.2 kg (163 lb 9.3 oz) IBW/kg (Calculated) : 59.2  Temp (24hrs), Avg:98.5 F (36.9 C), Min:97.9 F (36.6 C), Max:99 F (37.2 C)  Recent Labs  Lab 04/24/20 0623 04/24/20 1640 04/25/20 0403 04/25/20 1600 04/26/20 0450 04/26/20 1632 04/27/20 0421  WBC  --    < > 11.1* 12.9* 8.6 11.8* 10.8*  CREATININE  --    < > 0.73 0.77 0.60* 0.66 0.63  LATICACIDVEN  --    < > 1.0 0.5 0.9 1.1 1.0  VANCOTROUGH 15  --   --   --   --   --   --    < > = values in this interval not displayed.    Estimated Creatinine Clearance: 105.3 mL/min (by C-G formula based on SCr of 0.63 mg/dL).    No Known Allergies   Thank you for allowing pharmacy to be a part of this patient's care.  04/28/20 PharmD., BCPS Clinical Pharmacist 04/27/2020 7:38 AM

## 2020-04-28 ENCOUNTER — Inpatient Hospital Stay (HOSPITAL_COMMUNITY): Payer: Medicaid Other

## 2020-04-28 LAB — BASIC METABOLIC PANEL
Anion gap: 7 (ref 5–15)
Anion gap: 7 (ref 5–15)
BUN: 37 mg/dL — ABNORMAL HIGH (ref 6–20)
BUN: 32 mg/dL — ABNORMAL HIGH (ref 6–20)
CO2: 28 mmol/L (ref 22–32)
CO2: 29 mmol/L (ref 22–32)
Calcium: 8.4 mg/dL — ABNORMAL LOW (ref 8.9–10.3)
Calcium: 8.4 mg/dL — ABNORMAL LOW (ref 8.9–10.3)
Chloride: 99 mmol/L (ref 98–111)
Chloride: 98 mmol/L (ref 98–111)
Creatinine, Ser: 0.59 mg/dL — ABNORMAL LOW (ref 0.61–1.24)
Creatinine, Ser: 0.59 mg/dL — ABNORMAL LOW (ref 0.61–1.24)
GFR, Estimated: >60 mL/min (ref >60)
GFR, Estimated: >60 mL/min (ref >60)
Glucose, Bld: 133 mg/dL — ABNORMAL HIGH (ref 70–99)
Glucose, Bld: 275 mg/dL — ABNORMAL HIGH (ref 70–99)
Potassium: 3.8 mmol/L (ref 3.5–5.1)
Potassium: 4.1 mmol/L (ref 3.5–5.1)
Sodium: 134 mmol/L — ABNORMAL LOW (ref 135–145)
Sodium: 134 mmol/L — ABNORMAL LOW (ref 135–145)

## 2020-04-28 LAB — LACTIC ACID, PLASMA
Lactic Acid, Venous: 0.6 mmol/L (ref 0.5–1.9)
Lactic Acid, Venous: 1.3 mmol/L (ref 0.5–1.9)

## 2020-04-28 LAB — POCT I-STAT 7, (LYTES, BLD GAS, ICA,H+H)
Acid-Base Excess: 5 mmol/L — ABNORMAL HIGH (ref 0.0–2.0)
Acid-Base Excess: 3 mmol/L — ABNORMAL HIGH (ref 0.0–2.0)
Acid-Base Excess: 3 mmol/L — ABNORMAL HIGH (ref 0.0–2.0)
Acid-Base Excess: 6 mmol/L — ABNORMAL HIGH (ref 0.0–2.0)
Bicarbonate: 31.3 mmol/L — ABNORMAL HIGH (ref 20.0–28.0)
Bicarbonate: 30.7 mmol/L — ABNORMAL HIGH (ref 20.0–28.0)
Bicarbonate: 30.8 mmol/L — ABNORMAL HIGH (ref 20.0–28.0)
Bicarbonate: 32.6 mmol/L — ABNORMAL HIGH (ref 20.0–28.0)
Calcium, Ion: 1.25 mmol/L (ref 1.15–1.40)
Calcium, Ion: 1.25 mmol/L (ref 1.15–1.40)
Calcium, Ion: 1.24 mmol/L (ref 1.15–1.40)
Calcium, Ion: 1.25 mmol/L (ref 1.15–1.40)
HCT: 25 % — ABNORMAL LOW (ref 39.0–52.0)
HCT: 28 % — ABNORMAL LOW (ref 39.0–52.0)
HCT: 28 % — ABNORMAL LOW (ref 39.0–52.0)
HCT: 28 % — ABNORMAL LOW (ref 39.0–52.0)
Hemoglobin: 8.5 g/dL — ABNORMAL LOW (ref 13.0–17.0)
Hemoglobin: 9.5 g/dL — ABNORMAL LOW (ref 13.0–17.0)
Hemoglobin: 9.5 g/dL — ABNORMAL LOW (ref 13.0–17.0)
Hemoglobin: 9.5 g/dL — ABNORMAL LOW (ref 13.0–17.0)
O2 Saturation: 98 %
O2 Saturation: 92 %
O2 Saturation: 98 %
O2 Saturation: 94 %
Patient temperature: 97.7
Patient temperature: 37.3
Potassium: 3.8 mmol/L (ref 3.5–5.1)
Potassium: 4 mmol/L (ref 3.5–5.1)
Potassium: 4.3 mmol/L (ref 3.5–5.1)
Potassium: 3.9 mmol/L (ref 3.5–5.1)
Sodium: 134 mmol/L — ABNORMAL LOW (ref 135–145)
Sodium: 134 mmol/L — ABNORMAL LOW (ref 135–145)
Sodium: 135 mmol/L (ref 135–145)
Sodium: 135 mmol/L (ref 135–145)
TCO2: 33 mmol/L — ABNORMAL HIGH (ref 22–32)
TCO2: 33 mmol/L — ABNORMAL HIGH (ref 22–32)
TCO2: 33 mmol/L — ABNORMAL HIGH (ref 22–32)
TCO2: 34 mmol/L — ABNORMAL HIGH (ref 22–32)
pCO2 arterial: 54.9 mmHg — ABNORMAL HIGH (ref 32.0–48.0)
pCO2 arterial: 61.6 mmHg — ABNORMAL HIGH (ref 32.0–48.0)
pCO2 arterial: 62.2 mmHg — ABNORMAL HIGH (ref 32.0–48.0)
pCO2 arterial: 60.4 mmHg — ABNORMAL HIGH (ref 32.0–48.0)
pH, Arterial: 7.362 (ref 7.350–7.450)
pH, Arterial: 7.306 — ABNORMAL LOW (ref 7.350–7.450)
pH, Arterial: 7.303 — ABNORMAL LOW (ref 7.350–7.450)
pH, Arterial: 7.341 — ABNORMAL LOW (ref 7.350–7.450)
pO2, Arterial: 111 mmHg — ABNORMAL HIGH (ref 83.0–108.0)
pO2, Arterial: 72 mmHg — ABNORMAL LOW (ref 83.0–108.0)
pO2, Arterial: 123 mmHg — ABNORMAL HIGH (ref 83.0–108.0)
pO2, Arterial: 76 mmHg — ABNORMAL LOW (ref 83.0–108.0)

## 2020-04-28 LAB — CBC
HCT: 25.7 % — ABNORMAL LOW (ref 39.0–52.0)
HCT: 27.6 % — ABNORMAL LOW (ref 39.0–52.0)
Hemoglobin: 8.7 g/dL — ABNORMAL LOW (ref 13.0–17.0)
Hemoglobin: 9.1 g/dL — ABNORMAL LOW (ref 13.0–17.0)
MCH: 31.3 pg (ref 26.0–34.0)
MCH: 31 pg (ref 26.0–34.0)
MCHC: 33.9 g/dL (ref 30.0–36.0)
MCHC: 33 g/dL (ref 30.0–36.0)
MCV: 92.4 fL (ref 80.0–100.0)
MCV: 93.9 fL (ref 80.0–100.0)
Platelets: 176 10*3/uL (ref 150–400)
Platelets: 196 10*3/uL (ref 150–400)
RBC: 2.78 MIL/uL — ABNORMAL LOW (ref 4.22–5.81)
RBC: 2.94 MIL/uL — ABNORMAL LOW (ref 4.22–5.81)
RDW: 17.5 % — ABNORMAL HIGH (ref 11.5–15.5)
RDW: 17.5 % — ABNORMAL HIGH (ref 11.5–15.5)
WBC: 10.3 10*3/uL (ref 4.0–10.5)
WBC: 11.8 10*3/uL — ABNORMAL HIGH (ref 4.0–10.5)
nRBC: 0.2 % (ref 0.0–0.2)
nRBC: 0.2 % (ref 0.0–0.2)

## 2020-04-28 LAB — APTT
aPTT: 64 seconds — ABNORMAL HIGH (ref 24–36)
aPTT: 65 seconds — ABNORMAL HIGH (ref 24–36)

## 2020-04-28 LAB — PROTIME-INR
INR: 1.5 — ABNORMAL HIGH (ref 0.8–1.2)
Prothrombin Time: 17.3 seconds — ABNORMAL HIGH (ref 11.4–15.2)

## 2020-04-28 LAB — GLUCOSE, CAPILLARY
Glucose-Capillary: 126 mg/dL — ABNORMAL HIGH (ref 70–99)
Glucose-Capillary: 149 mg/dL — ABNORMAL HIGH (ref 70–99)
Glucose-Capillary: 141 mg/dL — ABNORMAL HIGH (ref 70–99)
Glucose-Capillary: 235 mg/dL — ABNORMAL HIGH (ref 70–99)
Glucose-Capillary: 241 mg/dL — ABNORMAL HIGH (ref 70–99)
Glucose-Capillary: 226 mg/dL — ABNORMAL HIGH (ref 70–99)

## 2020-04-28 LAB — BODY FLUID CELL COUNT WITH DIFFERENTIAL
Eos, Fluid: 0 %
Lymphs, Fluid: 65 %
Monocyte-Macrophage-Serous Fluid: 21 % — ABNORMAL LOW (ref 50–90)
Neutrophil Count, Fluid: 14 % (ref 0–25)
Total Nucleated Cell Count, Fluid: 541 cu mm (ref 0–1000)

## 2020-04-28 LAB — MAGNESIUM: Magnesium: 2.1 mg/dL (ref 1.7–2.4)

## 2020-04-28 LAB — CULTURE, BLOOD (ROUTINE X 2): Culture: NO GROWTH

## 2020-04-28 LAB — LACTATE DEHYDROGENASE: LDH: 480 U/L — ABNORMAL HIGH (ref 98–192)

## 2020-04-28 LAB — LACTATE DEHYDROGENASE, PLEURAL OR PERITONEAL FLUID: LD, Fluid: 170 U/L — ABNORMAL HIGH (ref 3–23)

## 2020-04-28 LAB — FIBRINOGEN: Fibrinogen: 278 mg/dL (ref 210–475)

## 2020-04-28 LAB — PROTEIN, PLEURAL OR PERITONEAL FLUID: Total protein, fluid: <3 g/dL

## 2020-04-28 LAB — VANCOMYCIN, TROUGH: Vancomycin Tr: 19 ug/mL (ref 15–20)

## 2020-04-28 MED ORDER — POTASSIUM CHLORIDE 20 MEQ PO PACK
40.0000 meq | PACK | Freq: Once | ORAL | Status: AC
  Administered 2020-04-28: 40 meq
  Filled 2020-04-28: qty 2

## 2020-04-28 NOTE — Progress Notes (Signed)
Occupational Therapy Treatment Patient Details Name: Alex [name] MRN: [id number] DOB: 1970-06-08 Today's Date: 04/28/2020    History of present illness Pt is 50 y.o. male  with no significant past medical history admitted on 02/28/2020 with dyspnea, cough, nausea/vomiting ~ 1 week ago with worsening symptoms of body aches and fatigue and +COVID 02/07/20 and admitted with shortness of breath. Required intubation 03/10/20. Cannulated for VV ECMO 02/21/2020. Oxygenating better with ECMO. Also evidence of RLE DVT, RV thrombus, and high suspicion of PE. Has required chest tube to L lung for collapse which needs further reposition with recurrent collapse. Extubated 03-15-19.  Reintubated 1/5 and trach placed 1/6. Decannulated 04/16/20.   OT comments  Patient continues to make nice progress toward stated OT goals.  Patient able to sit edge of bed this date and walk nearly 25' with Min A of 2 a L platform walker and nursing support.  Patient is also demonstrating and increased ability to grip, hold items and do more self feeding and light grooming tasks.  OT will continue in the acute setting, mobility and activity tolerance are the focus for an eventual transition to CIR.     Follow Up Recommendations  CIR;Supervision/Assistance - 24 hour    Equipment Recommendations       Recommendations for Other Services      Precautions / Restrictions Precautions Precautions: Fall Precaution Comments: ECMO, NGtube, Foley, Pt decannulated himself 2/4 Restrictions Weight Bearing Restrictions: No Other Position/Activity Restrictions: L platform walker used for mobility due to discomfort with grip to L hand       Mobility Bed Mobility Overal bed mobility: Needs Assistance       Supine to sit: +2 for physical assistance;Min assist        Transfers   Equipment used: Left platform walker Transfers: Sit to/from Stand;Stand Pivot Transfers Sit to Stand: +2 physical assistance;+2 safety/equipment;Min  assist Stand pivot transfers: +2 physical assistance;+2 safety/equipment;Min assist            Balance   Sitting-balance support: No upper extremity supported;Feet unsupported Sitting balance-Leahy Scale: Fair     Standing balance support: Bilateral upper extremity supported;During functional activity Standing balance-Leahy Scale: Poor                             ADL either performed or assessed with clinical judgement   ADL       Grooming: Wash/dry hands;Wash/dry face;Minimal assistance                               Functional mobility during ADLs: Minimal assistance;+2 for physical assistance;+2 for safety/equipment                         Cognition Arousal/Alertness: Awake/alert Behavior During Therapy: Flat affect Overall Cognitive Status: Within Functional Limits for tasks assessed                                                            Pertinent Vitals/ Pain       Pain Assessment: No/denies pain Pain Intervention(s): Monitored during session  Frequency  Min 2X/week        Progress Toward Goals  OT Goals(current goals can now be found in the care plan section)        Plan Discharge plan remains appropriate    Co-evaluation    PT/OT/SLP Co-Evaluation/Treatment: Yes Reason for Co-Treatment: Complexity of the patient's impairments (multi-system involvement);For patient/therapist safety;To address functional/ADL transfers   OT goals addressed during session: ADL's and self-care;Strengthening/ROM      AM-PAC OT "6 Clicks" Daily Activity     Outcome Measure   Help from another person eating meals?: A Little Help from another person taking care of personal grooming?: A Little Help from another person toileting, which includes using toliet, bedpan, or urinal?: A Lot Help from another person bathing  (including washing, rinsing, drying)?: A Lot Help from another person to put on and taking off regular upper body clothing?: A Lot Help from another person to put on and taking off regular lower body clothing?: A Lot 6 Click Score: 14    End of Session Equipment Utilized During Treatment: Oxygen;Gait belt;Rolling walker  OT Visit Diagnosis: Muscle weakness (generalized) (M62.81);Other symptoms and signs involving cognitive function;Other abnormalities of gait and mobility (R26.89)   Activity Tolerance Patient tolerated treatment well   Patient Left in chair;with call bell/phone within reach;with nursing/sitter in room   Nurse Communication          Time: 4034-7425 OT Time Calculation (min): 40 min  Charges: OT General Charges $OT Visit: 1 Visit OT Treatments $Therapeutic Activity: 8-22 mins  04/28/2020  Rich, OTR/L  Acute Rehabilitation Services  Office:  [phone]    Suzanna Obey 04/28/2020, 12:38 PM

## 2020-04-28 NOTE — Progress Notes (Signed)
Attempted to get Alex [name]'s standing weight this morning but he was unable to tolerate the movements. Had difficulty with maintaining his hips forward in neutral standing position. Otherwise handled transfer from bed to chair well.

## 2020-04-28 NOTE — Progress Notes (Signed)
Pharmacy Antibiotic Note  Alex [name] is a 50 y.o. male on ECMO. Pharmacy consulted to dose vancomycin for treatment of enterococcal bacteremia. TEE showed no vegetation. Treating nosocomial enterococcus faecalis bacteremia. 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 Vancomycin tentatively planned through 2/21.   Vancomycin trough today is 19 mcg/ml. Cr remains stable ~0.6. Repeat cultures 2/11 are no growth.  Plan: -Will continue vancomycin 1000mg  IV q12h -Follow SCr daily   Height: 5\' 4"  (162.6 cm) Weight: 72.7 kg (160 lb 4.4 oz) IBW/kg (Calculated) : 59.2  Temp (24hrs), Avg:98.3 F (36.8 C), Min:97.6 F (36.4 C), Max:99.5 F (37.5 C)  Recent Labs  Lab 04/24/20 0623 04/24/20 1640 04/26/20 0450 04/26/20 1632 04/27/20 0421 04/27/20 1649 04/28/20 0418 04/28/20 0715  WBC  --    < > 8.6 11.8* 10.8* 8.0 10.3  --   CREATININE  --    < > 0.60* 0.66 0.63 0.49* 0.59*  --   LATICACIDVEN  --    < > 0.9 1.1 1.0 0.7 0.6  --   VANCOTROUGH 15  --   --   --   --   --   --  19   < > = values in this interval not displayed.    Estimated Creatinine Clearance: 104.3 mL/min (A) (by C-G formula based on SCr of 0.59 mg/dL (L)).    No Known Allergies   Thank you for allowing pharmacy to be a part of this patient's care.  04/30/20 PharmD., BCPS Clinical Pharmacist 04/28/2020 11:14 AM

## 2020-04-28 NOTE — Progress Notes (Signed)
ANTICOAGULATION CONSULT NOTE  Pharmacy Consult for bivalirudin Indication: ECMO and VTE  Labs: Recent Labs    04/26/20 0450 04/26/20 0839 04/27/20 0421 04/27/20 0622 04/27/20 1649 04/27/20 1654 04/28/20 0418 04/28/20 0420 04/28/20 1457 04/28/20 1620 04/28/20 1652  HGB 7.4*  7.5*   < > 9.6*   < > 8.8*   < > 8.7*   < > 9.5* 9.5* 9.1*  HCT 23.5*  22.0*   < > 30.0*   < > 25.6*   < > 25.7*   < > 28.0* 28.0* 27.6*  PLT 169   < > 176  --  171  --  176  --   --   --  196  APTT 70*   < > 64*  --  65*  --  64*  --   --   --  65*  LABPROT 18.5*  --  17.5*  --   --   --  17.3*  --   --   --   --   INR 1.6*  --  1.5*  --   --   --  1.5*  --   --   --   --   CREATININE 0.60*   < > 0.63  --  0.49*  --  0.59*  --   --   --  0.59*   < > = values in this interval not displayed.    Assessment: 27 yoM admitted with COVID-19 PNA with worsening hypoxia, s/p cannulation for ECMO. Pt was started on IV heparin prior to cannulation due to acute DVTs and possible PE, transitioned to bivalirudin with ECMO. Now s/p tPA on 1/5 and tracheostomy on 1/6. Circuit last changed 1/26.  aPTT continue to be therapeutic at 65 sec. No bleeding reported. LDH stable 400s. Hgb stable 9.1, plt wnl.  Goal of Therapy:  aPTT 60-80 seconds   Plan:  -Continue bivalirudin at 0.085 mg/kg/hr (using order-specific wt 72.1kg) -Monitor q12h aPTT/CBC, LDH, and for s/sx of bleeding   Leia Alf, PharmD, BCPS Please check AMION for all [facility] Pharmacy contact numbers Clinical Pharmacist 04/28/2020 6:54 PM

## 2020-04-28 NOTE — Plan of Care (Signed)
  Problem: Health Behavior/Discharge Planning: Goal: Ability to manage health-related needs will improve Outcome: Progressing   Problem: Clinical Measurements: Goal: Ability to maintain clinical measurements within normal limits will improve Outcome: Progressing Goal: Will remain free from infection Outcome: Progressing Goal: Diagnostic test results will improve Outcome: Progressing Goal: Respiratory complications will improve Outcome: Progressing Goal: Cardiovascular complication will be avoided Outcome: Progressing   Problem: Activity: Goal: Risk for activity intolerance will decrease Outcome: Progressing   Problem: Coping: Goal: Level of anxiety will decrease Outcome: Progressing   Problem: Pain Managment: Goal: General experience of comfort will improve Outcome: Progressing   Problem: Elimination: Goal: Will not experience complications related to bowel motility Outcome: Progressing Goal: Will not experience complications related to urinary retention Outcome: Progressing   Problem: Safety: Goal: Ability to remain free from injury will improve Outcome: Progressing   Problem: Skin Integrity: Goal: Risk for impaired skin integrity will decrease Outcome: Progressing   Problem: Education: Goal: Knowledge of risk factors and measures for prevention of condition will improve Outcome: Progressing   Problem: Coping: Goal: Psychosocial and spiritual needs will be supported Outcome: Progressing   Problem: Respiratory: Goal: Will maintain a patent airway Outcome: Progressing Goal: Complications related to the disease process, condition or treatment will be avoided or minimized Outcome: Progressing   Problem: Activity: Goal: Ability to tolerate increased activity will improve Outcome: Progressing   Problem: Respiratory: Goal: Ability to maintain a clear airway and adequate ventilation will improve Outcome: Progressing   Problem: Role Relationship: Goal: Method  of communication will improve Outcome: Progressing   

## 2020-04-28 NOTE — Progress Notes (Signed)
Physical Therapy Treatment Patient Details Name: Alex [name] MRN: [id number] DOB: 01/09/1971 Today's Date: 04/28/2020    History of Present Illness Pt is 50 y.o. male  with no significant past medical history admitted on 03/06/2020 with dyspnea, cough, nausea/vomiting ~ 1 week ago with worsening symptoms of body aches and fatigue and +COVID 02/07/20 and admitted with shortness of breath. Required intubation 03/10/20. Cannulated for VV ECMO 02/23/2020. Oxygenating better with ECMO. Also evidence of RLE DVT, RV thrombus, and high suspicion of PE. Has required chest tube to L lung for collapse which needs further reposition with recurrent collapse. Extubated 03-15-19.  Reintubated 1/5 and trach placed 1/6. Decannulated 04/16/20.    PT Comments    Pt continues to make strong progress towards his goals. Pt surpassed expectations by ambulating approximately 75 feet with 3 bouts of walking. Treatment team in agreement to increase oxygenation via ECMO and supplementally to improve pt ability to participate in therapy. Pt endurance and alertness improved with this strategy. Continue to work on Animator starting with core strength and pelvic rotation. As pt fatigues hip adductors activation decreases narrowing his BoS. Pt utilized L platform walker today and had no complaints of pain in L hand today. Pt reports feeling more secure with new walker set up. Pt also revitalized by support of whole treatment team while walking in hallway. PT will continue to work with pt 5x/wk to progress gait mechanics, and distance/endurance.      Follow Up Recommendations  CIR;Supervision/Assistance - 24 hour     Equipment Recommendations  Other (comment) (TBD (pt still on ECMO))    Recommendations for Other Services       Precautions / Restrictions Precautions Precautions: Fall Precaution Comments: ECMO, NGtube, Foley, Pt decannulated himself 2/4 Restrictions Weight Bearing Restrictions: No Other  Position/Activity Restrictions: L platform walker used for mobility due to discomfort with grip to L hand    Mobility  Bed Mobility Overal bed mobility: Needs Assistance Bed Mobility: Sit to Supine     Supine to sit: +2 for physical assistance;Min assist     General bed mobility comments: minA for assisting pt come to EoB, mainly for pad scoot of hips to EoB    Transfers Overall transfer level: Needs assistance Equipment used: Left platform walker Transfers: Sit to/from Stand;Stand Pivot Transfers Sit to Stand: +2 physical assistance;+2 safety/equipment;Min assist Stand pivot transfers: +2 physical assistance;+2 safety/equipment;Min assist       General transfer comment: minA for power up to standing and assit to bring L arm up to platform  Ambulation/Gait Ambulation/Gait assistance: Max assist;Total assist;Mod assist Gait Distance (Feet): 75 Feet (in 3 bouts of walking) Assistive device: Left platform walker Gait Pattern/deviations: Step-to pattern;Step-through pattern;Decreased step length - right;Decreased step length - left;Wide base of support;Narrow base of support;Trunk flexed;Drifts right/left;Shuffle;Decreased weight shift to right;Decreased weight shift to left Gait velocity: slowed Gait velocity interpretation: <1.31 ft/sec, indicative of household ambulator General Gait Details: pt with 3 bouts of ambulation today, able to make it out of room, pt with assist of 5 today PT/OT facilitated ambulation, ECMO specilst, RN and NT for equipment and chair. Continue to focus on facilitating pelvic rotation for increased foot clearance, cuing for wider BoS, need to work on hip Adductor strength, core strength         Balance Overall balance assessment: Needs assistance Sitting-balance support: No upper extremity supported;Feet unsupported Sitting balance-Leahy Scale: Fair     Standing balance support: Bilateral upper extremity supported;During functional  activity Standing balance-Leahy  Scale: Poor Standing balance comment: requires outside support to maintain balance in standing                            Cognition Arousal/Alertness: Awake/alert Behavior During Therapy: Flat affect Overall Cognitive Status: Within Functional Limits for tasks assessed                                           General Comments General comments (skin integrity, edema, etc.): RN and ECMO specialist in agreement to increase supplemental O2 from 6 to 8L [place] and ECMO sweep increased from 5.5>6.5>8, increased oxygenation improved pt endurance for mobility and improved alertness and increased speed and accuracy of command follow      Pertinent Vitals/Pain Pain Assessment: No/denies pain Pain Intervention(s): Monitored during session           PT Goals (current goals can now be found in the care plan section) Acute Rehab PT Goals Patient Stated Goal: continue to walk PT Goal Formulation: With patient Time For Goal Achievement: 05/10/20 Potential to Achieve Goals: Fair Progress towards PT goals: Progressing toward goals    Frequency    Min 3X/week      PT Plan Current plan remains appropriate    Co-evaluation PT/OT/SLP Co-Evaluation/Treatment: Yes Reason for Co-Treatment: Complexity of the patient's impairments (multi-system involvement) PT goals addressed during session: Mobility/safety with mobility OT goals addressed during session: ADL's and self-care      AM-PAC PT "6 Clicks" Mobility   Outcome Measure  Help needed turning from your back to your side while in a flat bed without using bedrails?: A Lot Help needed moving from lying on your back to sitting on the side of a flat bed without using bedrails?: A Lot Help needed moving to and from a bed to a chair (including a wheelchair)?: A Lot Help needed standing up from a chair using your arms (e.g., wheelchair or bedside chair)?: A Lot Help needed to walk in  hospital room?: A Lot Help needed climbing 3-5 steps with a railing? : Total 6 Click Score: 11    End of Session Equipment Utilized During Treatment: Gait belt Activity Tolerance: Patient tolerated treatment well Patient left: in chair;with call bell/phone within reach;with nursing/sitter in room Nurse Communication: Mobility status;Other (comment) (scheduled approx 11:30 for daily therapy) PT Visit Diagnosis: Muscle weakness (generalized) (M62.81);Difficulty in walking, not elsewhere classified (R26.2);Other abnormalities of gait and mobility (R26.89);Unsteadiness on feet (R26.81)     Time: 1140-1220 PT Time Calculation (min) (ACUTE ONLY): 40 min  Charges:  $Gait Training: 23-37 mins                     Abdelaziz Westenberger B. Beverely Risen PT, DPT Acute Rehabilitation Services Pager [phone] Office [phone]    [facility]on Alas [facility] 04/28/2020, 3:34 PM

## 2020-04-28 NOTE — Progress Notes (Signed)
Patient ID: Alex [name], male   DOB: 05/30/1970, 50 y.o.   MRN: [id number]     Advanced Heart Failure Rounding Note  PCP-Cardiologist: No primary care provider on file.   Subjective:    - 12/30: VV ECMO cannulation - 12/31: Left chest tube replaced - 1/2: Extubated. Echo with EF 60-65%, mildly dilated RV with mildly decreased systolic function.  - 1/4: Agitated, suspected aspiration.  Re-intubated.  - 1/5: ECMO cannula repositioned under TEE guidance. TEE showed moderately dilated/moderate-severely dysfunctional RV in setting of hypoxemia. LUE DVT found.  Patient got 1/2 dose of TPA due to initial concern for large PE.  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  - 1/6: Tracheostomy - 1/7: Echo with mild RV dilation/mild RV dysfunction.  - 1/16: Left chest tube out - 1/17: LUE arterial dopplers repeated, showed no obstruction.  - 1/20: Echo with EF 65-70%, mildly D-shaped septum, mildly dilated and mildly dysfunctional RV.  - 1/22: CTA chest: Bilateral upper lobe PEs (suspect chronic), changes c/w ARDS - 1/23: Bronchoscopy showed semi-occlusive ?mass/polyp in the trachea.  - 1/24: Bronchoscopy showed resolution of 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 - 1/26: ECMO circuit changed.  - 2/1: CT chest showed diffuse bronchiectasis as well as diffuse opacity consistent with COVID-19 PNA with ARDS. - 2/3: Trach exchange - 2/5: Decannualted to HFNC - 2/6: Enterococcal PNA/bacteremia.  Echo showed EF 65-70%, normal-appearing RV, no vegetation noted.  - 2/7: TEE with no vegetation, EF 55-60%, RV low normal function with normal size.   Has walked already this morning. Talking, taking pos.  CXR slowly improving.    Remains on vancomycin to 2/21.   ECMO parameters: 3500 rpm Flow 4.7 L/min Pvenous -71 Delta P 33 Sweep 5.5 HFNC 4 L  ABG 7.36/55/111/98% LDH  534 => 488 => 547 => 461 => => 507 => 475 => 501 => 478 => 449 => 495 => 480 PTT 64 Lactate 0.6 Hgb 8.7  Objective:   Weight  Range: 72.7 kg Body mass index is 27.51 kg/m.   Vital Signs:   Temp:  [97.6 F (36.4 C)-99.5 F (37.5 C)] 97.6 F (36.4 C) (02/16 0400) Pulse Rate:  [86-110] 102 (02/16 0700) Resp:  [10-38] 24 (02/16 0700) BP: (96-147)/(58-100) 120/100 (02/16 0700) SpO2:  [92 %-100 %] 100 % (02/16 0700) Arterial Line BP: (103-192)/(51-89) 144/69 (02/16 0700) Weight:  [72.7 kg] 72.7 kg (02/16 0640) Last BM Date: 04/27/20  Weight change: Filed Weights   04/26/20 0500 04/27/20 0620 04/28/20 0640  Weight: 70.9 kg 74.2 kg 72.7 kg    Intake/Output:   Intake/Output Summary (Last 24 hours) at 04/28/2020 0737 Last data filed at 04/28/2020 0700 Gross per 24 hour  Intake 3191.63 ml  Output 3880 ml  Net -688.37 ml      Physical Exam    General: NAD Neck: No JVD, no thyromegaly or thyroid nodule.  Lungs: Crackles at bases.  CV: Nondisplaced PMI.  Heart regular S1/S2, no S3/S4, no murmur.  1+ ankle edema.  Abdomen: Soft, nontender, no hepatosplenomegaly, no distention.  Skin: Intact without lesions or rashes.  Neurologic: Alert and oriented x 3.  Psych: Normal affect. Extremities: Dry gangrene digits left hand.  HEENT: Normal.    Telemetry   NSR 100s. Personally reviewed   Labs    CBC Recent Labs    04/27/20 1649 04/27/20 1654 04/28/20 0418 04/28/20 0420  WBC 8.0  --  10.3  --   HGB 8.8*   < > 8.7* 8.5*  HCT  25.6*   < > 25.7* 25.0*  MCV 90.5  --  92.4  --   PLT 171  --  176  --    < > = values in this interval not displayed.   Basic Metabolic Panel Recent Labs    16/10/96 1649 04/27/20 1654 04/28/20 0418 04/28/20 0420  NA 135   < > 134* 134*  K 3.3*   < > 3.8 3.8  CL 99  --  99  --   CO2 27  --  28  --   GLUCOSE 51*  --  133*  --   BUN 38*  --  37*  --   CREATININE 0.49*  --  0.59*  --   CALCIUM 8.4*  --  8.4*  --   MG  --   --  2.1  --    < > = values in this interval not displayed.   Liver Function Tests No results for input(s): AST, ALT, ALKPHOS, BILITOT,  PROT, ALBUMIN in the last 72 hours. No results for input(s): LIPASE, AMYLASE in the last 72 hours. Cardiac Enzymes No results for input(s): CKTOTAL, CKMB, CKMBINDEX, TROPONINI in the last 72 hours.  BNP: BNP (last 3 results) No results for input(s): BNP in the last 8760 hours.  ProBNP (last 3 results) No results for input(s): PROBNP in the last 8760 hours.   D-Dimer No results for input(s): DDIMER in the last 72 hours. Hemoglobin A1C No results for input(s): HGBA1C in the last 72 hours. Fasting Lipid Panel No results for input(s): CHOL, HDL, LDLCALC, TRIG, CHOLHDL, LDLDIRECT in the last 72 hours. Thyroid Function Tests No results for input(s): TSH, T4TOTAL, T3FREE, THYROIDAB in the last 72 hours.  Invalid input(s): FREET3  Other results:   Imaging    DG CHEST PORT 1 VIEW  Result Date: 04/28/2020 CLINICAL DATA:  Recent COVID, ECMO. EXAM: PORTABLE CHEST 1 VIEW COMPARISON:  04/27/2020 FINDINGS: Support devices are stable. Extensive bilateral airspace disease. No significant change since prior study. IMPRESSION: Severe diffuse bilateral airspace disease, unchanged. Electronically Signed   By: Charlett Nose M.D.   On: 04/28/2020 06:56   DG Swallowing Func-Speech Pathology  Result Date: 04/27/2020 Objective Swallowing Evaluation: Type of Study: MBS-Modified Barium Swallow Study  Patient Details Name: Chen Saadeh MRN: [id number] Date of Birth: 1970/08/05 Today's Date: 04/27/2020 Time: SLP Start Time (ACUTE ONLY): 0940 -SLP Stop Time (ACUTE ONLY): 1020 SLP Time Calculation (min) (ACUTE ONLY): 40 min Past Medical History: No past medical history on file. Past Surgical History: Past Surgical History: Procedure Laterality Date . CENTRAL LINE INSERTION  03/09/2020  Procedure: CENTRAL LINE INSERTION;  Surgeon: Laurey Morale, MD;  Location: [facility] INVASIVE CV LAB;  Service: Cardiovascular;; . ECMO CANNULATION N/A 03/07/2020  Procedure: ECMO CANNULATION;  Surgeon: Laurey Morale, MD;  Location:  [facility] INVASIVE CV LAB;  Service: Cardiovascular;  Laterality: N/A; . TEE WITHOUT CARDIOVERSION  02/19/2020  Procedure: TRANSESOPHAGEAL ECHOCARDIOGRAM (TEE);  Surgeon: Laurey Morale, MD;  Location: [facility] INVASIVE CV LAB;  Service: Cardiovascular;; . [place] teeth   HPI: 50 year old man admitted to hospital 12/28 with 1 week history of dyspnea cough nausea and vomiting.  Initially admitted to [facility] hospital and placed on high flow nasal cannula but rapidly failed and required intubation 12/29.  Persistent hypoxic respiratory failure with PF ratio 55 in spite of 18 of PEEP FiO2 0.1 despite paralytics.  Did not improve with prone ventilation. Cannulated for VV ECMO 12/30 via right IJ crescent cannula. Iatrogenic  pneumothorax from left subclavian triple-lumen placement. Trach on 1/6. Decannulated 04/16/20  Subjective: lethargic Assessment / Plan / Recommendation CHL IP CLINICAL IMPRESSIONS 04/27/2020 Clinical Impression Pt demonstrates mild oropharyngeal dysphagia with mild lethargy note today during study. He was able to orally manipulate all POs, but when masticating solid he needed cues to sustain attention and started drifting off to sleep. With verbal reminders he completed mastication. Pt also struggled to coordinate pill with liquids; pill lodged briefly in valleculae. Pharyngeal function characterized by adequate strength, but instances of slightly delayed swallow initiation resulting in some flash and flash frank penetration. Even when pressed with large quantities with rapid intake, no aspiration occurred. Pt incidentally not coughing during assessment. Esophageal sweep appeared WNL. Recommend pt initaite thin liquids and mech soft solids. Meds crushed in puree. Pt can trial regular texture pleasure foods if he tolerates mech soft and is fully alert. SLP Visit Diagnosis Dysphagia, oropharyngeal phase (R13.12) Attention and concentration deficit following -- Frontal lobe and executive function deficit following  -- Impact on safety and function Mild aspiration risk   CHL IP TREATMENT RECOMMENDATION 04/27/2020 Treatment Recommendations Therapy as outlined in treatment plan below   Prognosis 04/27/2020 Prognosis for Safe Diet Advancement Good Barriers to Reach Goals -- Barriers/Prognosis Comment -- CHL IP DIET RECOMMENDATION 04/27/2020 SLP Diet Recommendations Dysphagia 3 (Mech soft) solids;Thin liquid Liquid Administration via Straw;Cup Medication Administration Crushed with puree Compensations Slow rate;Small sips/bites Postural Changes Seated upright at 90 degrees   CHL IP OTHER RECOMMENDATIONS 04/27/2020 Recommended Consults -- Oral Care Recommendations Oral care BID Other Recommendations Have oral suction available   CHL IP FOLLOW UP RECOMMENDATIONS 04/27/2020 Follow up Recommendations Inpatient Rehab   CHL IP FREQUENCY AND DURATION 04/27/2020 Speech Therapy Frequency (ACUTE ONLY) min 2x/week Treatment Duration 2 weeks      CHL IP ORAL PHASE 04/27/2020 Oral Phase Impaired Oral - Pudding Teaspoon -- Oral - Pudding Cup -- Oral - Honey Teaspoon -- Oral - Honey Cup -- Oral - Nectar Teaspoon -- Oral - Nectar Cup -- Oral - Nectar Straw -- Oral - Thin Teaspoon -- Oral - Thin Cup -- Oral - Thin Straw WFL Oral - Puree WFL Oral - Mech Soft Delayed oral transit;Impaired mastication Oral - Regular -- Oral - Multi-Consistency -- Oral - Pill Decreased bolus cohesion;Delayed oral transit;Reduced posterior propulsion Oral Phase - Comment --  CHL IP PHARYNGEAL PHASE 04/27/2020 Pharyngeal Phase Impaired Pharyngeal- Pudding Teaspoon -- Pharyngeal -- Pharyngeal- Pudding Cup -- Pharyngeal -- Pharyngeal- Honey Teaspoon -- Pharyngeal -- Pharyngeal- Honey Cup -- Pharyngeal -- Pharyngeal- Nectar Teaspoon -- Pharyngeal -- Pharyngeal- Nectar Cup -- Pharyngeal -- Pharyngeal- Nectar Straw -- Pharyngeal -- Pharyngeal- Thin Teaspoon -- Pharyngeal -- Pharyngeal- Thin Cup -- Pharyngeal -- Pharyngeal- Thin Straw Penetration/Aspiration before swallow;Delayed  swallow initiation-vallecula;Delayed swallow initiation-pyriform sinuses Pharyngeal Material enters airway, CONTACTS cords and then ejected out;Material enters airway, remains ABOVE vocal cords then ejected out;Material does not enter airway Pharyngeal- Puree Delayed swallow initiation-vallecula Pharyngeal -- Pharyngeal- Mechanical Soft Delayed swallow initiation-vallecula Pharyngeal -- Pharyngeal- Regular -- Pharyngeal -- Pharyngeal- Multi-consistency -- Pharyngeal -- Pharyngeal- Pill Delayed swallow initiation-vallecula Pharyngeal -- Pharyngeal Comment --  No flowsheet data found. Harlon Ditty, MA CCC-SLP Acute Rehabilitation Services Pager [phone] Office [phone] Claudine Mouton 04/27/2020, 11:22 AM              VAS [place] UPPER EXTREMITY VENOUS DUPLEX  Result Date: 04/27/2020 UPPER VENOUS STUDY  Indications: Swelling Risk Factors: None identified. Comparison Study: No previous RUE venous Performing Technologist: Misty Stanley  Hollice Espy RVT  Examination Guidelines: A complete evaluation includes B-mode imaging, spectral Doppler, color Doppler, and power Doppler as needed of all accessible portions of each vessel. Bilateral testing is considered an integral part of a complete examination. Limited examinations for reoccurring indications may be performed as noted.  Right Findings: +----------+------------+---------+-----------+----------+--------------+ RIGHT     CompressiblePhasicitySpontaneousProperties   Summary     +----------+------------+---------+-----------+----------+--------------+ IJV                                                 Not visualized +----------+------------+---------+-----------+----------+--------------+ Subclavian               Yes       Yes                             +----------+------------+---------+-----------+----------+--------------+ Axillary      Full       Yes       Yes                              +----------+------------+---------+-----------+----------+--------------+ Brachial      Full       Yes       Yes                             +----------+------------+---------+-----------+----------+--------------+ Radial        Full                                                 +----------+------------+---------+-----------+----------+--------------+ Ulnar         Full                                                 +----------+------------+---------+-----------+----------+--------------+ Cephalic      Full                                                 +----------+------------+---------+-----------+----------+--------------+ Basilic                                             Not visualized +----------+------------+---------+-----------+----------+--------------+  Summary:  Right: No evidence of deep vein thrombosis in the upper extremity. No evidence of superficial vein thrombosis in the upper extremity. However, Unable to visualize IJV due to ECMO and basilic due to arm position.  *See table(s) above for measurements and observations.    Preliminary      Medications:     Scheduled Medications: . sodium chloride   Intravenous Once  . acetaZOLAMIDE  250 mg Per Tube Daily  . bethanechol  10 mg Per Tube TID  . carvedilol  25 mg Per Tube BID WC  . chlorhexidine  15 mL Mouth Rinse BID  .  Chlorhexidine Gluconate Cloth  6 each Topical Daily  . chlorpheniramine-HYDROcodone  5 mL Per Tube Q12H  . clonazePAM  0.5 mg Per Tube Daily  . clonazePAM  1 mg Per Tube BID  . cloNIDine  0.1 mg Per Tube BID  . dextromethorphan  30 mg Per Tube TID  . docusate  100 mg Per Tube BID  . doxazosin  2 mg Per Tube Daily  . feeding supplement  237 mL Oral TID BM  . feeding supplement (PIVOT 1.5 CAL)  1,000 mL Per Tube Q24H  . fiber  1 packet Per Tube BID  . furosemide  40 mg Intravenous BID  . gabapentin  400 mg Per Tube Q8H  . insulin aspart  0-20 Units Subcutaneous Q4H  .  insulin aspart  10 Units Subcutaneous TID  . insulin detemir  25 Units Subcutaneous Daily  . insulin detemir  40 Units Subcutaneous QHS  . ipratropium-albuterol  3 mL Nebulization BID  . liver oil-zinc oxide   Topical 5 X Daily  . mouth rinse  15 mL Mouth Rinse q12n4p  . melatonin  5 mg Per Tube QHS  . metoCLOPramide (REGLAN) injection  10 mg Intravenous Q8H  . oxyCODONE  15 mg Per Tube Q8H  . pantoprazole sodium  40 mg Per Tube QHS  . QUEtiapine  25 mg Per Tube QHS  . sennosides  10 mL Per Tube QHS  . sodium chloride flush  10-40 mL Intracatheter Q12H  . sodium chloride flush  10-40 mL Intracatheter Q12H    Infusions: . sodium chloride    . sodium chloride Stopped (04/19/20 1959)  . albumin human Stopped (04/23/20 0041)  . bivalirudin (ANGIOMAX) infusion 0.5 mg/mL (Non-ACS indications) 0.085 mg/kg/hr (04/28/20 0700)  . vancomycin Stopped (04/27/20 2153)    PRN Medications: Place/Maintain arterial line **AND** sodium chloride, acetaminophen (TYLENOL) oral liquid 160 mg/5 mL, albumin human, [DISCONTINUED] lidocaine **AND** albuterol, Gerhardt's butt cream, guaiFENesin, hydrALAZINE, labetalol, lip balm, ondansetron (ZOFRAN) IV, phenol, polyethylene glycol, promethazine-codeine, simethicone, sodium chloride, sodium chloride flush, sodium chloride flush   Assessment/Plan   1. Acute hypoxemic respiratory failure: Due to COVID-19 PNA with bilateral infiltrates.  Refractory hypoxemia, VV-ECMO cannulation on 02/12/2020 with improvement in oxygenation.  Developed left PTX post-subclavian CVL and had left chest tube, the left lung is re-expanded and CT out.  He was extubated 1/2 but reintubated 1/4 with agitation and suspected aspiration.  Tracheostomy 1/6.  CTA chest 1/22 with suspected chronic PEs and ARDS. ECMO cannula repositioned 1/5. ECMO circuit changed 1/26. Extubated to HFNC on 2/5. LDH stable. Sweep at 6.5, have struggled with hypercarbia from significant dead space ventilation and had a  set back with recurrent sepsis but now improving.  Restarted abx 2/5 with sepsis, Enterococcus faecalis, now on vancomycin.  I/Os negative yesterday with IV Lasix + acetazolamide.  - Continue slow sweep wean => decrease to 4.5 today - Continue acetazolamide 250 mg daily and Lasix 40 mg IV bid.  - On vancomycin with Enterococcus.  ID has seen - Patient has had remdesivir, tocilizumab. - Completed steroid taper. - Continue bivalirudin, goal PTT 65-80.  PTT 64. Discussed dosing with PharmD personally. - Continue to mobilize => work with PT, doing some walking. - Coreg continues at 25 mg bid to increase fractional flow via ECMO circuit.  - Plan for thoracentesis today for pleural effusion.  2. 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  Clot noted on TEE in RV as well.  TTE 1/2 showed normal EF 60-65%, RV improved (mildly dilated/dysfunctional). TEE on 1/5 with moderate to severe RV dysfunction but patient was hypoxemic.  Had 1/2 dose TPA on 1/5. Echo 1/20 with mildly dilated/mildly dysfunctional RV. CTA chest 1/22 with chronic-appearing PEs in upper lobes. - PTT 64. Bivalirudin for goal PTT 65-80. Discussed dosing with PharmD personally. 3. Left PTX: Left chest tube, lung is re-expanded. Tube now out, stable CXR. 4. Shock: Suspect septic/distributive.  Now resolved, off NE.  5. Anemia: Hgb 8.7, transfuse < 7.5. Limit transfusion with transplant possibly on the horizon 6. AKI: Resolved 7. Hyperglycemia: insulin.  8. HTN: BP lower.     - Clonidine decreased to 0.1 bid and continue to wean down as able. No change today - Continue Coreg 25 mg bid.   - Suspect arterial line inaccurate, following cuff for now.  9. CHB: Episode of CHB when hypoxemic and with cough (suspect vagal).  NSR since then.   He has occasional short vagal episodes.  - Continue Coreg, watch rhythm.  10. Thrombocytopenia: Resolved  11. FEN: Continue tube feeds.  12.  Ischemic digits: LUE.  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  Repeat study 1/17 showed no obstruction.  - Wound Care following. 13. ID: Group F Strep and Enterococcus faecalis in sputum. Initially completed abx for these bacteria. Now with recurrent sepsis, Enterococcus faecalis in blood now. Vancomycin restarted. TEE 2/7 with no vegetation.  - Continue vancomycin to 2/21.   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   - resolved 15. Hypernatremia: Resolved. Continue free water. No change 16. Hematuria: Resolved with foley change.   CRITICAL CARE Performed by: Marca Ancona  Total critical care time: 35 minutes  Critical care time was exclusive of separately billable procedures and treating other patients.  Critical care was necessary to treat or prevent imminent or life-threatening deterioration.  Critical care was time spent personally by me on the following activities: development of treatment plan with patient and/or surrogate as well as nursing, discussions with consultants, evaluation of patient's response to treatment, examination of patient, obtaining history from patient or surrogate, ordering and performing treatments and interventions, ordering and review of laboratory studies, ordering and review of radiographic studies, pulse oximetry and re-evaluation of patient's condition.    Length of Stay: 10  Marca Ancona, MD  04/28/2020, 7:37 AM  Advanced Heart Failure Team Pager [phone] (M-F; 7a - 4p)  Please contact [facility] for night-coverage after hours (4p -7a ) and weekends on [email or web address]

## 2020-04-28 NOTE — Progress Notes (Signed)
ANTICOAGULATION CONSULT NOTE  Pharmacy Consult for bivalirudin Indication: ECMO and VTE  Labs: Recent Labs    04/26/20 0450 04/26/20 0839 04/27/20 0421 04/27/20 0622 04/27/20 1649 04/27/20 1654 04/27/20 2018 04/28/20 0418 04/28/20 0420  HGB 7.4*  7.5*   < > 9.6*   < > 8.8*   < > 10.2* 8.7* 8.5*  HCT 23.5*  22.0*   < > 30.0*   < > 25.6*   < > 30.0* 25.7* 25.0*  PLT 169   < > 176  --  171  --   --  176  --   APTT 70*   < > 64*  --  65*  --   --  64*  --   LABPROT 18.5*  --  17.5*  --   --   --   --  17.3*  --   INR 1.6*  --  1.5*  --   --   --   --  1.5*  --   CREATININE 0.60*   < > 0.63  --  0.49*  --   --  0.59*  --    < > = values in this interval not displayed.    Assessment: 76 yoM admitted with COVID-19 PNA with worsening hypoxia, s/p cannulation for ECMO. Pt was started on IV heparin prior to cannulation due to acute DVTs and possible PE, transitioned to bivalirudin with ECMO. Now s/p tPA on 1/5 and tracheostomy on 1/6. Circuit last changed 1/26.  aPTT continue to be therapeutic at 64 sec. No bleeding reported. LDH stable 400s. Hgb down to 8.5. Patient progressing well.  Goal of Therapy:  aPTT 60-80 seconds   Plan:  -Continue bivalirudin at 0.085 mg/kg/hr (using order-specific wt 72.1kg) -Monitor q12h aPTT/CBC, LDH, and for s/sx of bleeding  Sheppard Coil PharmD., BCPS Clinical Pharmacist 04/28/2020 11:14 AM

## 2020-04-28 NOTE — Progress Notes (Signed)
SLP Cancellation Note  Patient Details Name: Daveyon Kitchings MRN: [id number] DOB: Jan 24, 1971   Cancelled treatment:       Reason Eval/Treat Not Completed: Other (comment). Pt sleeping, RN reports Mr. Haddix enjoyed his breakfast and RN did not have any concerns about safety with intake. Will f/u as able   Rober Skeels, Riley Nearing 04/28/2020, 11:46 AM

## 2020-04-28 NOTE — Progress Notes (Signed)
ECMO PROGRESS NOTE  NAME:  Alex [name], MRN:  [id number], DOB:  04-25-1970, LOS: 50 ADMISSION DATE:  03/05/2020, CONSULTATION DATE: Mar 13, 2020 REFERRING MD: Wynona Neat -LBPCCM, CHIEF COMPLAINT: Respiratory failure requiring ECMO  HPI/course in hospital  50 year old man admitted to hospital 12/28 with 1 week history of dyspnea cough nausea and vomiting.  Initially admitted to [facility] and placed on high flow nasal cannula but rapidly failed and required intubation 12/29.  Persistent hypoxic respiratory failure with PF ratio 55 in spite of 18 of PEEP FiO2 0.1 despite paralytics.  Did not improve with prone ventilation  Cannulated for VV ECMO 12/30 via right IJ crescent cannula.  ECMO circuit was changed on 04/07/2020 Iatrogenic pneumothorax from left subclavian triple-lumen placement  12/28 admitted covid, ARDS 12/30 ECMO cannulation, iatrogenic pneumothrorax, DVT, Bivalrudin started           Left subclavian hematoma, chest tube, ceftriaxone/ azithromycin started 12/31 1Unit PRBC 1/1 Palliative consult, noted HTN, fentanyl only 1/2 Extubation I/O positive, left lung re-expanded, EF 60-65%, Lasix 20mg  BID 1/3 BiPAP in place, HTN to 190s, 200s, labetolol for BP, I/O even, cefepime and vancomycin started 1/4 agitation off BiPAP, possible aspiration 1/5 agitation overnight, reintubated      ECHO with RV dilation, ECMO cannula repositioned, LUE DVT (+), 1/2 dose tPA given,      I/O up, on lasix, Epi, NE, vasopressin started, HR/ rhythm issues noted 1/6 Tracheostomy, hypoglycemic when off tube feeds       Cortrack tune issues 1/7 on lasix gtt 4mg /hr with diuresis, agitation improved 1/8 starting steroid taper 1/9 severe agitation, heavy sedation required, lasix gtt 6mg /hr, I/Os (-)       precedex and fentanyl 1/10 Vanc stopped 1/11 sweep to 2, lasix gtt at 4mg /hr, weight up, metoprolol added for HTN 1/12 fevers to 99 noted, lasix gtt and metazolone 1/13 lasix gtt dced 1/14  intermittent HTN, possibly flash pulmonary edema tied to sedation        Ischemic left hand changes, 1 Unit PRBCs 1/15 1/16 sweep from 4 to 2.5, chest tube removed 1/17 sweep at 6, trial of nebulized morphine for cough, LUE dopplers show no obstruction 1/18 two events of 2nd degree type II HB noted with coughing         sweep at 4, IV lasix 40, acetazolamide, distal XLT trach placement         agitation and BP spikes 1/19 agitation, air hunger, seroquel, klonopin, oxy, valproate, ketamine trial 1/20 sweep at 3.5, diuresis results in chugging, albumin given         1 Unit PRBCs, lidocaine nebulizers for cough, ancef started 1/21 sweep to 8, anxiety, I/Os (+) with lasix and acetazolamide 1/22 sweep at 5, chest CT bilateral upper lobe PEs 1/23 sweep at 5, delirium, I/Os even with lasix and acetazolamide         continued coughing, 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 1/24 sweep at 7.5, 1 Unit PRBCs, repeat bronchoscopy showed resolution of 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 1/25 sweep at 7.5, on propofol, lasix gtt at 51ml/hr, I/Os (+)         nebulized morphine and lidocaine for cough 1/26 sweep at 8, propofol/ precedex, cough present when weaned, lasix gtt at 48ml/hr        ECMO circuit changes, levophed started, 1 Unit PRBCs 1/27 sweep at 4, off sedation, delirium, I/Os even        lasix gtt restated, acetazolamide overnight 1/28 sweep at 7.5, HTN  labile 1/29 sweep of 6, patient reports a tickle in throat, cetacaine 1/30 sweep down to 6, cough improved with cetacaine and gabapentin 1/31 sweep at 5, agitation, off acetazolamide, lasix, weight up 2/1 cough, of pulmicort, cetacaine for cough, continued gabapentin, dexmethorphan       CT demonstrated trach placement against back of trachea 2/2 Trach collar trial somewhat effective in managing cough       low dose diltiazem for HR,  2/3 sweep at 4,trach cannula replacement (longer, more flexible bivona); brochoscopy shows  irritated mucosa       IV lasix and acetazolamide 2/4 sweep at 5, I/Os (+), BUN elevated, lasix gtt with diamox,       trach decannulation, increased gabapentin for cough 2/5 start HCAP coverage for rising WBC/temps, check Pct/cultures, CXR worse with lack of PEEP, diuresed well but started getting very hypotensive, sepsis vs. Too dry 04/18/20 blood cx grew E faecalis 2/7 PICC change, TEE  Past Medical History  none  Interim history/subjective:  Had a good night, slept well, pain under control, sweep at 5.5LPM.  Objective   Blood pressure (!) 144/89, pulse 99, temperature 98.4 F (36.9 C), temperature source Oral, resp. rate (!) 24, height 5\' 4"  (1.626 m), weight 72.7 kg, SpO2 100 %.        Intake/Output Summary (Last 24 hours) at 04/28/2020 04/30/2020 Last data filed at 04/28/2020 0800 Gross per 24 hour  Intake 3474.01 ml  Output 3805 ml  Net -330.99 ml   Filed Weights   04/26/20 0500 04/27/20 0620 04/28/20 0640  Weight: 70.9 kg 74.2 kg 72.7 kg    Examination: Constitutional: no acute distress sitting in chair  Eyes: EOMI, pupils equal, tracking Ears, nose, mouth, and throat: MMM, trach stoma dressed without leak Cardiovascular: RRR, ext warm Respiratory: shallow inspiratory efforts, no wheezing, diminished on R Gastrointestinal: slightly distended, +BS Skin: No rashes, normal turgor, stable L hand ischemic changes Neurologic: moves all 4 ext to command Psychiatric: RASS 0, in good spirits  CBG variable with new TF schedule, adjusting after discussion with PharmD Net -0.6L CBC stable LDH stable CXR stable diffuse airspace disease, R effusion persists  Assessment & Plan:  Acute hypoxemic/hypercapnic respiratory failure due to severe ARDS from COVID-19 pneumonia Probable acute PE, and RV dysfunction s/p TPA Refractory coughing- improved after trach removal but still an issue Strep group F/ enterococcal pneumonia- s/p 10 days vanc 1/29; recurrent with bacteremia on 2/5. R  effusion- question parapneumonic - Continue VV ECMO, sweep weans titrated to WOB/ABG - Cough regimen as ordered - Continue supplemental oxygen via nasal cannula, can decrease as his oxygen improves. - IS, flutter, OOB mobility to prevent muscle atrophy - Keep even with diuresis: lasix and acetazolamide as ordered - R therapeutic/diagnostic thora today  HTN.  Trials of reducing HR/BP have resulted in reduced pCO2 (likely related to fractional flow through ECMO circuit) -Continue coreg, clonidine at current doses for now  Acute delirium, with agitation- improved. -RASS goal -1 to 0, PRN, morphine PRN, oxycodone 15mg > now q8h, qHS seroquel, klonipin to BID 1mg  -Plans to slowly wean this over next couple weeks:   Sepsis secondary to E faecalis bacteremia- secondary to PNA, has grown enterococcus in sputum prior, previous vanc course completed 1/29. TEE on 2/7 without vegetations. PICC changed on 2/7. -Continue vanc through 2/21 per ID  Gastroparesis with some element ileus: improved but occasional vomiting spells with coughing attacks.  Stable - Reglan back to 10mg  Q8h on 2/11. 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  - wound care following, appreciate dressing recommendations - surgical consult for L hand once more stable - keep A-line out of left arm   R arm swelling- duplex neg, NTD here  Hyperglycemia due to diabetes type II -Basal bolus insulin adjusted (see orders), goal CBG 100-180  Acute urinary retention; no evidence of UTI based on UA Hematuria -failed condom cath trial, foley replaced 2/7; again on 2/11 -increase mobility -continue cardura - rechallenge with voiding trial 2/18  Deconditioning -con't working with PT, OT, SLP   Daily Goals Checklist  Pain/Anxiety/Delirium protocol (if indicated): see above VAP protocol (if indicated):   n/a DVT prophylaxis: bivalirudin GI prophylaxis: pantoprazole Glucose control: basal bolus insulin Mobility/therapy needs: Mobilization as tolerated Code Status: Full code Disposition: ICU  Patient critically ill due to ARDS Interventions to address this today ECMO titration Risk of deterioration without these interventions is high  I personally spent 32 minutes providing critical care not including any separately billable procedures  Myrla Halsted MD [facility] 04/12/2020 7:23 AM Prefer epic messenger for cross cover needs If after hours, please call E-link

## 2020-04-28 NOTE — Procedures (Signed)
Extracorporeal support note  ECLS cannulation date: 02/29/2020 Last circuit change: 04/07/2020  Indication: Acute hypoxic respiratory failure due to ARDS from COVID-19 pneumonia with RV dysfunction.   Configuration: Venovenous  Drainage cannula: 32 French crescent cannula via right IJ Return cannula: Same  Pump speed: 3500 RPM Pump flow: 4.78 L/min Pump used: Cardio help  Oxygenator: Cardio help O2 blender: 100% Sweep gas: 5.5L  Circuit check: minimal thrombin in circuit, no clots, unchanged Anticoagulant: Bivalirudin Anticoagulation targets: PTT 60-80  Changes in support: continue mobility as tolerated to prevent muscle atrophy, slowly wean down on sedating meds, wean sweep as tolerated by WOB and ABG, had a good night  Anticipated goals/duration of support: Bridge to recovery   Multidisciplinary ECMO rounds completed.  Lorin Glass, MD 04/28/20 9:36 AM [facility]

## 2020-04-28 NOTE — Procedures (Signed)
Thoracentesis  Procedure Note  Alex [name]  [id number]  [birth year]  Date:04/28/20  Time:9:30 AM   Provider Performing:Anitta Tenny C Katrinka Blazing   Procedure: Thoracentesis with imaging guidance (60737)  Indication(s) Pleural Effusion  Consent Risks of the procedure as well as the alternatives and risks of each were explained to the patient and/or caregiver.  Consent for the procedure was obtained and is signed in the bedside chart  Anesthesia Topical only with 1% lidocaine    Time Out Verified patient identification, verified procedure, site/side was marked, verified correct patient position, special equipment/implants available, medications/allergies/relevant history reviewed, required imaging and test results available.   Sterile Technique Maximal sterile technique including full sterile barrier drape, hand hygiene, sterile gown, sterile gloves, mask, hair covering, sterile ultrasound probe cover (if used).  Procedure Description Ultrasound was used to identify appropriate pleural anatomy for placement and overlying skin marked.  Area of drainage cleaned and draped in sterile fashion. Lidocaine was used to anesthetize the skin and subcutaneous tissue.  500 cc's of amber appearing fluid was drained from the right pleural space. Catheter then removed and bandaid applied to site.   Complications/Tolerance None; patient tolerated the procedure well. Chest X-ray is ordered to confirm no post-procedural complication.   EBL Minimal   Specimen(s) Pleural fluid

## 2020-04-29 ENCOUNTER — Inpatient Hospital Stay (HOSPITAL_COMMUNITY): Payer: Medicaid Other

## 2020-04-29 LAB — GLUCOSE, CAPILLARY
Glucose-Capillary: 163 mg/dL — ABNORMAL HIGH (ref 70–99)
Glucose-Capillary: 199 mg/dL — ABNORMAL HIGH (ref 70–99)
Glucose-Capillary: 115 mg/dL — ABNORMAL HIGH (ref 70–99)
Glucose-Capillary: 136 mg/dL — ABNORMAL HIGH (ref 70–99)
Glucose-Capillary: 261 mg/dL — ABNORMAL HIGH (ref 70–99)

## 2020-04-29 LAB — POCT I-STAT 7, (LYTES, BLD GAS, ICA,H+H)
Acid-Base Excess: 7 mmol/L — ABNORMAL HIGH (ref 0.0–2.0)
Acid-Base Excess: 6 mmol/L — ABNORMAL HIGH (ref 0.0–2.0)
Acid-Base Excess: 8 mmol/L — ABNORMAL HIGH (ref 0.0–2.0)
Acid-Base Excess: 7 mmol/L — ABNORMAL HIGH (ref 0.0–2.0)
Acid-Base Excess: 7 mmol/L — ABNORMAL HIGH (ref 0.0–2.0)
Acid-Base Excess: 8 mmol/L — ABNORMAL HIGH (ref 0.0–2.0)
Bicarbonate: 34.5 mmol/L — ABNORMAL HIGH (ref 20.0–28.0)
Bicarbonate: 32.5 mmol/L — ABNORMAL HIGH (ref 20.0–28.0)
Bicarbonate: 33.9 mmol/L — ABNORMAL HIGH (ref 20.0–28.0)
Bicarbonate: 33.9 mmol/L — ABNORMAL HIGH (ref 20.0–28.0)
Bicarbonate: 34.9 mmol/L — ABNORMAL HIGH (ref 20.0–28.0)
Bicarbonate: 34.5 mmol/L — ABNORMAL HIGH (ref 20.0–28.0)
Calcium, Ion: 1.23 mmol/L (ref 1.15–1.40)
Calcium, Ion: 1.22 mmol/L (ref 1.15–1.40)
Calcium, Ion: 1.24 mmol/L (ref 1.15–1.40)
Calcium, Ion: 1.28 mmol/L (ref 1.15–1.40)
Calcium, Ion: 1.25 mmol/L (ref 1.15–1.40)
Calcium, Ion: 1.24 mmol/L (ref 1.15–1.40)
HCT: 26 % — ABNORMAL LOW (ref 39.0–52.0)
HCT: 28 % — ABNORMAL LOW (ref 39.0–52.0)
HCT: 26 % — ABNORMAL LOW (ref 39.0–52.0)
HCT: 27 % — ABNORMAL LOW (ref 39.0–52.0)
HCT: 27 % — ABNORMAL LOW (ref 39.0–52.0)
HCT: 25 % — ABNORMAL LOW (ref 39.0–52.0)
Hemoglobin: 8.8 g/dL — ABNORMAL LOW (ref 13.0–17.0)
Hemoglobin: 9.5 g/dL — ABNORMAL LOW (ref 13.0–17.0)
Hemoglobin: 8.8 g/dL — ABNORMAL LOW (ref 13.0–17.0)
Hemoglobin: 9.2 g/dL — ABNORMAL LOW (ref 13.0–17.0)
Hemoglobin: 9.2 g/dL — ABNORMAL LOW (ref 13.0–17.0)
Hemoglobin: 8.5 g/dL — ABNORMAL LOW (ref 13.0–17.0)
O2 Saturation: 98 %
O2 Saturation: 93 %
O2 Saturation: 99 %
O2 Saturation: 98 %
O2 Saturation: 96 %
O2 Saturation: 98 %
Patient temperature: 37.3
Patient temperature: 37.1
Patient temperature: 37
Patient temperature: 37
Patient temperature: 37
Potassium: 3.8 mmol/L (ref 3.5–5.1)
Potassium: 5.2 mmol/L — ABNORMAL HIGH (ref 3.5–5.1)
Potassium: 3.9 mmol/L (ref 3.5–5.1)
Potassium: 3.9 mmol/L (ref 3.5–5.1)
Potassium: 3.8 mmol/L (ref 3.5–5.1)
Potassium: 3.7 mmol/L (ref 3.5–5.1)
Sodium: 135 mmol/L (ref 135–145)
Sodium: 134 mmol/L — ABNORMAL LOW (ref 135–145)
Sodium: 135 mmol/L (ref 135–145)
Sodium: 133 mmol/L — ABNORMAL LOW (ref 135–145)
Sodium: 134 mmol/L — ABNORMAL LOW (ref 135–145)
Sodium: 133 mmol/L — ABNORMAL LOW (ref 135–145)
TCO2: 36 mmol/L — ABNORMAL HIGH (ref 22–32)
TCO2: 34 mmol/L — ABNORMAL HIGH (ref 22–32)
TCO2: 36 mmol/L — ABNORMAL HIGH (ref 22–32)
TCO2: 36 mmol/L — ABNORMAL HIGH (ref 22–32)
TCO2: 37 mmol/L — ABNORMAL HIGH (ref 22–32)
TCO2: 36 mmol/L — ABNORMAL HIGH (ref 22–32)
pCO2 arterial: 64.4 mmHg — ABNORMAL HIGH (ref 32.0–48.0)
pCO2 arterial: 60.5 mmHg — ABNORMAL HIGH (ref 32.0–48.0)
pCO2 arterial: 55.4 mmHg — ABNORMAL HIGH (ref 32.0–48.0)
pCO2 arterial: 64.3 mmHg — ABNORMAL HIGH (ref 32.0–48.0)
pCO2 arterial: 71.4 mmHg (ref 32.0–48.0)
pCO2 arterial: 60.5 mmHg — ABNORMAL HIGH (ref 32.0–48.0)
pH, Arterial: 7.338 — ABNORMAL LOW (ref 7.350–7.450)
pH, Arterial: 7.338 — ABNORMAL LOW (ref 7.350–7.450)
pH, Arterial: 7.395 (ref 7.350–7.450)
pH, Arterial: 7.33 — ABNORMAL LOW (ref 7.350–7.450)
pH, Arterial: 7.297 — ABNORMAL LOW (ref 7.350–7.450)
pH, Arterial: 7.364 (ref 7.350–7.450)
pO2, Arterial: 127 mmHg — ABNORMAL HIGH (ref 83.0–108.0)
pO2, Arterial: 75 mmHg — ABNORMAL LOW (ref 83.0–108.0)
pO2, Arterial: 144 mmHg — ABNORMAL HIGH (ref 83.0–108.0)
pO2, Arterial: 108 mmHg (ref 83.0–108.0)
pO2, Arterial: 96 mmHg (ref 83.0–108.0)
pO2, Arterial: 109 mmHg — ABNORMAL HIGH (ref 83.0–108.0)

## 2020-04-29 LAB — CBC
HCT: 27.2 % — ABNORMAL LOW (ref 39.0–52.0)
HCT: 25.3 % — ABNORMAL LOW (ref 39.0–52.0)
Hemoglobin: 8.7 g/dL — ABNORMAL LOW (ref 13.0–17.0)
Hemoglobin: 8.5 g/dL — ABNORMAL LOW (ref 13.0–17.0)
MCH: 30.3 pg (ref 26.0–34.0)
MCH: 31.1 pg (ref 26.0–34.0)
MCHC: 32 g/dL (ref 30.0–36.0)
MCHC: 33.6 g/dL (ref 30.0–36.0)
MCV: 94.8 fL (ref 80.0–100.0)
MCV: 92.7 fL (ref 80.0–100.0)
Platelets: 189 10*3/uL (ref 150–400)
Platelets: 200 10*3/uL (ref 150–400)
RBC: 2.87 MIL/uL — ABNORMAL LOW (ref 4.22–5.81)
RBC: 2.73 MIL/uL — ABNORMAL LOW (ref 4.22–5.81)
RDW: 17.3 % — ABNORMAL HIGH (ref 11.5–15.5)
RDW: 17.2 % — ABNORMAL HIGH (ref 11.5–15.5)
WBC: 11.3 10*3/uL — ABNORMAL HIGH (ref 4.0–10.5)
WBC: 10 10*3/uL (ref 4.0–10.5)
nRBC: 0.3 % — ABNORMAL HIGH (ref 0.0–0.2)
nRBC: 0 % (ref 0.0–0.2)

## 2020-04-29 LAB — TYPE AND SCREEN
ABO/RH(D): B POS
Antibody Screen: NEGATIVE
Unit division: 0
Unit division: 0
Unit division: 0
Unit division: 0

## 2020-04-29 LAB — BPAM RBC
Blood Product Expiration Date: 202202212359
Blood Product Expiration Date: 202203022359
Blood Product Expiration Date: 202203072359
Blood Product Expiration Date: 202203072359
ISSUE DATE / TIME: 202202150920
ISSUE DATE / TIME: 202202150920
ISSUE DATE / TIME: 202202150920
ISSUE DATE / TIME: 202202150920
Unit Type and Rh: 7300
Unit Type and Rh: 7300
Unit Type and Rh: 7300
Unit Type and Rh: 7300

## 2020-04-29 LAB — BASIC METABOLIC PANEL
Anion gap: 8 (ref 5–15)
Anion gap: 8 (ref 5–15)
BUN: 30 mg/dL — ABNORMAL HIGH (ref 6–20)
BUN: 28 mg/dL — ABNORMAL HIGH (ref 6–20)
CO2: 31 mmol/L (ref 22–32)
CO2: 30 mmol/L (ref 22–32)
Calcium: 8.5 mg/dL — ABNORMAL LOW (ref 8.9–10.3)
Calcium: 8.4 mg/dL — ABNORMAL LOW (ref 8.9–10.3)
Chloride: 97 mmol/L — ABNORMAL LOW (ref 98–111)
Chloride: 96 mmol/L — ABNORMAL LOW (ref 98–111)
Creatinine, Ser: 0.67 mg/dL (ref 0.61–1.24)
Creatinine, Ser: 0.63 mg/dL (ref 0.61–1.24)
GFR, Estimated: >60 mL/min (ref >60)
GFR, Estimated: >60 mL/min (ref >60)
Glucose, Bld: 196 mg/dL — ABNORMAL HIGH (ref 70–99)
Glucose, Bld: 135 mg/dL — ABNORMAL HIGH (ref 70–99)
Potassium: 3.8 mmol/L (ref 3.5–5.1)
Potassium: 3.8 mmol/L (ref 3.5–5.1)
Sodium: 136 mmol/L (ref 135–145)
Sodium: 134 mmol/L — ABNORMAL LOW (ref 135–145)

## 2020-04-29 LAB — LACTIC ACID, PLASMA
Lactic Acid, Venous: 0.7 mmol/L (ref 0.5–1.9)
Lactic Acid, Venous: 0.9 mmol/L (ref 0.5–1.9)

## 2020-04-29 LAB — PROTIME-INR
INR: 1.4 — ABNORMAL HIGH (ref 0.8–1.2)
Prothrombin Time: 17.1 seconds — ABNORMAL HIGH (ref 11.4–15.2)

## 2020-04-29 LAB — APTT
aPTT: 62 seconds — ABNORMAL HIGH (ref 24–36)
aPTT: 66 seconds — ABNORMAL HIGH (ref 24–36)

## 2020-04-29 LAB — CULTURE, BLOOD (ROUTINE X 2)
Culture: NO GROWTH
Special Requests: ADEQUATE

## 2020-04-29 LAB — PATHOLOGIST SMEAR REVIEW

## 2020-04-29 LAB — FIBRINOGEN: Fibrinogen: 319 mg/dL (ref 210–475)

## 2020-04-29 LAB — LACTATE DEHYDROGENASE: LDH: 472 U/L — ABNORMAL HIGH (ref 98–192)

## 2020-04-29 MED ORDER — OXYCODONE HCL 5 MG PO TABS
15.0000 mg | ORAL_TABLET | ORAL | Status: DC | PRN
  Administered 2020-05-02 – 2020-05-07 (×9): 15 mg via ORAL
  Filled 2020-04-29 (×9): qty 3

## 2020-04-29 MED ORDER — INSULIN DETEMIR 100 UNIT/ML ~~LOC~~ SOLN
10.0000 [IU] | Freq: Every day | SUBCUTANEOUS | Status: DC
  Administered 2020-04-29: 10 [IU] via SUBCUTANEOUS
  Filled 2020-04-29 (×2): qty 0.1

## 2020-04-29 MED ORDER — ACETAZOLAMIDE 250 MG PO TABS
250.0000 mg | ORAL_TABLET | Freq: Two times a day (BID) | ORAL | Status: DC
  Administered 2020-04-29 – 2020-05-06 (×15): 250 mg
  Filled 2020-04-29 (×15): qty 1

## 2020-04-29 MED ORDER — POTASSIUM CHLORIDE 20 MEQ PO PACK
40.0000 meq | PACK | Freq: Once | ORAL | Status: AC
  Administered 2020-04-29: 40 meq
  Filled 2020-04-29: qty 2

## 2020-04-29 NOTE — Progress Notes (Signed)
ECMO patient transported outside with this ECMO specialist, bedside RN, NT and ECMO Coordinator without complications. When patient returned to 2H24, the circuit was connected back to a red wall outlet, the heater was turned back on and set to 37 degrees and unclamped when ready, the oxygen hose was connected back to the wall O2, and oxygen tubing connected back to the circuit flow and 6L of sweep.

## 2020-04-29 NOTE — Progress Notes (Signed)
Patient ID: Alex [name], male   DOB: 12-14-1970, 50 y.o.   MRN: [id number]     Advanced Heart Failure Rounding Note  PCP-Cardiologist: No primary care provider on file.   Subjective:    - 12/30: VV ECMO cannulation - 12/31: Left chest tube replaced - 1/2: Extubated. Echo with EF 60-65%, mildly dilated RV with mildly decreased systolic function.  - 1/4: Agitated, suspected aspiration.  Re-intubated.  - 1/5: ECMO cannula repositioned under TEE guidance. TEE showed moderately dilated/moderate-severely dysfunctional RV in setting of hypoxemia. LUE DVT found.  Patient got 1/2 dose of TPA due to initial concern for large PE.  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  - 1/6: Tracheostomy - 1/7: Echo with mild RV dilation/mild RV dysfunction.  - 1/16: Left chest tube out - 1/17: LUE arterial dopplers repeated, showed no obstruction.  - 1/20: Echo with EF 65-70%, mildly D-shaped septum, mildly dilated and mildly dysfunctional RV.  - 1/22: CTA chest: Bilateral upper lobe PEs (suspect chronic), changes c/w ARDS - 1/23: Bronchoscopy showed semi-occlusive ?mass/polyp in the trachea.  - 1/24: Bronchoscopy showed resolution of 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 - 1/26: ECMO circuit changed.  - 2/1: CT chest showed diffuse bronchiectasis as well as diffuse opacity consistent with COVID-19 PNA with ARDS. - 2/3: Trach exchange - 2/5: Decannualted to HFNC - 2/6: Enterococcal PNA/bacteremia.  Echo showed EF 65-70%, normal-appearing RV, no vegetation noted.  - 2/7: TEE with no vegetation, EF 55-60%, RV low normal function with normal size.  - 2/16: Right thoracentesis 500 cc  Walked in hall.  CXR slowly improving.  No complaints this morning.   Remains on vancomycin to 2/21.   ECMO parameters: 3500 rpm Flow 4.77 L/min Pvenous -89 Delta P 33 Sweep 5.0 HFNC 4 L  ABG 7.34/64/127/98% LDH  534 => 488 => 547 => 461 => => 507 => 475 => 501 => 478 => 449 => 495 => 480 => 472 PTT 62 Lactate  0.7 Hgb 8.7  Objective:   Weight Range: 71.9 kg Body mass index is 27.21 kg/m.   Vital Signs:   Temp:  [98.4 F (36.9 C)-99.1 F (37.3 C)] 99.1 F (37.3 C) (02/17 0400) Pulse Rate:  [92-116] 103 (02/17 0400) Resp:  [7-34] 30 (02/17 0400) BP: (120-147)/(63-94) 147/67 (02/17 0400) SpO2:  [96 %-100 %] 99 % (02/17 0400) Arterial Line BP: (100-206)/(53-95) 185/78 (02/17 0400) Weight:  [71.9 kg] 71.9 kg (02/17 0500) Last BM Date: 04/28/20  Weight change: Filed Weights   04/27/20 0620 04/28/20 0640 04/29/20 0500  Weight: 74.2 kg 72.7 kg 71.9 kg    Intake/Output:   Intake/Output Summary (Last 24 hours) at 04/29/2020 0735 Last data filed at 04/29/2020 0600 Gross per 24 hour  Intake 2956.92 ml  Output 3975 ml  Net -1018.08 ml      Physical Exam    General: NAD Neck: No JVD, no thyromegaly or thyroid nodule.  Lungs: Clear to auscultation bilaterally with normal respiratory effort. CV: Nondisplaced PMI.  Heart regular S1/S2, no S3/S4, no murmur.  No peripheral edema.   Abdomen: Soft, nontender, no hepatosplenomegaly, no distention.  Skin: Intact without lesions or rashes.  Neurologic: Alert and oriented x 3.  Psych: Normal affect. Extremities: Dry gangrene left digits HEENT: Normal.    Telemetry   NSR 100s. Personally reviewed   Labs    CBC Recent Labs    04/28/20 1652 04/28/20 1949 04/29/20 0403 04/29/20 0409  WBC 11.8*  --  11.3*  --  HGB 9.1*   < > 8.7* 8.8*  HCT 27.6*   < > 27.2* 26.0*  MCV 93.9  --  94.8  --   PLT 196  --  189  --    < > = values in this interval not displayed.   Basic Metabolic Panel Recent Labs    16/12/9600/16/22 0418 04/28/20 0420 04/28/20 1652 04/28/20 1949 04/29/20 0403 04/29/20 0409  NA 134*   < > 134*   < > 136 135  K 3.8   < > 4.1   < > 3.8 3.8  CL 99  --  98  --  97*  --   CO2 28  --  29  --  31  --   GLUCOSE 133*  --  275*  --  196*  --   BUN 37*  --  32*  --  30*  --   CREATININE 0.59*  --  0.59*  --  0.67  --    CALCIUM 8.4*  --  8.4*  --  8.5*  --   MG 2.1  --   --   --   --   --    < > = values in this interval not displayed.   Liver Function Tests No results for input(s): AST, ALT, ALKPHOS, BILITOT, PROT, ALBUMIN in the last 72 hours. No results for input(s): LIPASE, AMYLASE in the last 72 hours. Cardiac Enzymes No results for input(s): CKTOTAL, CKMB, CKMBINDEX, TROPONINI in the last 72 hours.  BNP: BNP (last 3 results) No results for input(s): BNP in the last 8760 hours.  ProBNP (last 3 results) No results for input(s): PROBNP in the last 8760 hours.   D-Dimer No results for input(s): DDIMER in the last 72 hours. Hemoglobin A1C No results for input(s): HGBA1C in the last 72 hours. Fasting Lipid Panel No results for input(s): CHOL, HDL, LDLCALC, TRIG, CHOLHDL, LDLDIRECT in the last 72 hours. Thyroid Function Tests No results for input(s): TSH, T4TOTAL, T3FREE, THYROIDAB in the last 72 hours.  Invalid input(s): FREET3  Other results:   Imaging    DG Chest 1 View  Result Date: 04/28/2020 CLINICAL DATA:  Post thoracentesis.  ECMO. EXAM: CHEST  1 VIEW COMPARISON:  04/28/2020 FINDINGS: Severe diffuse bilateral airspace disease, slightly worsened since prior study. No visible effusions. No pneumothorax. Support devices are unchanged. IMPRESSION: No pneumothorax following thoracentesis. Severe diffuse bilateral airspace disease, worsening since prior study. Electronically Signed   By: Charlett NoseKevin  Dover M.D.   On: 04/28/2020 10:04     Medications:     Scheduled Medications: . acetaZOLAMIDE  250 mg Per Tube Daily  . bethanechol  10 mg Per Tube TID  . carvedilol  25 mg Per Tube BID WC  . chlorhexidine  15 mL Mouth Rinse BID  . Chlorhexidine Gluconate Cloth  6 each Topical Daily  . chlorpheniramine-HYDROcodone  5 mL Per Tube Q12H  . clonazePAM  1 mg Per Tube BID  . cloNIDine  0.1 mg Per Tube BID  . dextromethorphan  30 mg Per Tube TID  . docusate  100 mg Per Tube BID  . doxazosin   2 mg Per Tube Daily  . feeding supplement  237 mL Oral TID BM  . feeding supplement (PIVOT 1.5 CAL)  1,000 mL Per Tube Q24H  . fiber  1 packet Per Tube BID  . furosemide  40 mg Intravenous BID  . gabapentin  400 mg Per Tube Q8H  . insulin aspart  0-20 Units  Subcutaneous Q4H  . insulin detemir  40 Units Subcutaneous QHS  . ipratropium-albuterol  3 mL Nebulization BID  . liver oil-zinc oxide   Topical 5 X Daily  . mouth rinse  15 mL Mouth Rinse q12n4p  . melatonin  5 mg Per Tube QHS  . metoCLOPramide (REGLAN) injection  10 mg Intravenous Q8H  . oxyCODONE  15 mg Per Tube Q8H  . pantoprazole sodium  40 mg Per Tube QHS  . potassium chloride  40 mEq Per Tube Once  . QUEtiapine  25 mg Per Tube QHS  . sennosides  10 mL Per Tube QHS  . sodium chloride flush  10-40 mL Intracatheter Q12H    Infusions: . sodium chloride    . sodium chloride 10 mL/hr at 04/29/20 0600  . albumin human Stopped (04/23/20 0041)  . bivalirudin (ANGIOMAX) infusion 0.5 mg/mL (Non-ACS indications) 0.085 mg/kg/hr (04/29/20 0600)  . vancomycin Stopped (04/28/20 2057)    PRN Medications: Place/Maintain arterial line **AND** sodium chloride, acetaminophen (TYLENOL) oral liquid 160 mg/5 mL, albumin human, [DISCONTINUED] lidocaine **AND** albuterol, Gerhardt's butt cream, guaiFENesin, hydrALAZINE, labetalol, lip balm, ondansetron (ZOFRAN) IV, phenol, polyethylene glycol, promethazine-codeine, simethicone, sodium chloride, sodium chloride flush   Assessment/Plan   1. Acute hypoxemic respiratory failure: Due to COVID-19 PNA with bilateral infiltrates.  Refractory hypoxemia, VV-ECMO cannulation on 02/20/2020 with improvement in oxygenation.  Developed left PTX post-subclavian CVL and had left chest tube, the left lung is re-expanded and CT out.  He was extubated 1/2 but reintubated 1/4 with agitation and suspected aspiration.  Tracheostomy 1/6.  CTA chest 1/22 with suspected chronic PEs and ARDS. ECMO cannula repositioned 1/5.  ECMO circuit changed 1/26. Extubated to HFNC on 2/5. LDH stable. Sweep at 6.5, have struggled with hypercarbia from significant dead space ventilation and had a set back with recurrent sepsis but now improving.  Restarted abx 2/5 with sepsis, Enterococcus faecalis, now on vancomycin.  I/Os negative yesterday with IV Lasix + acetazolamide.  - Continue slow sweep wean => decrease to 4 today (can increase back with increased work of breathing).  - Continue acetazolamide 250 mg daily and Lasix 40 mg IV bid today.  - On vancomycin with Enterococcus.  ID has seen - Patient has had remdesivir, tocilizumab. - Completed steroid taper. - Continue bivalirudin, goal PTT 65-80.  PTT 62. Discussed dosing with PharmD personally. - Continue to mobilize => walking in hall now.  - Coreg continues at 25 mg bid to increase fractional flow via ECMO circuit.  2. 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  Clot noted on TEE in RV as well.  TTE 1/2 showed normal EF 60-65%, RV improved (mildly dilated/dysfunctional). TEE on 1/5 with moderate to severe RV dysfunction but patient was hypoxemic.  Had 1/2 dose TPA on 1/5. Echo 1/20 with mildly dilated/mildly dysfunctional RV. CTA chest 1/22 with chronic-appearing PEs in upper lobes. - PTT 62. Bivalirudin for goal PTT 65-80. Discussed dosing with PharmD personally. 3. Left PTX: Left chest tube, lung is re-expanded. Tube now out, stable CXR. 4. Shock: Suspect septic/distributive.  Now resolved, off NE.  5. Anemia: Hgb 8.7, transfuse < 7.5. Limit transfusion with transplant possibly on the horizon 6. AKI: Resolved 7. Hyperglycemia: insulin.  8. HTN: BP lower.     - Clonidine decreased to 0.1 bid and continue to wean down as able. No change today - Continue Coreg 25 mg bid.   - Suspect arterial line inaccurate, following cuff for now.  9. CHB: Episode of CHB when hypoxemic and with cough (suspect vagal).  NSR since  then.   He has occasional short vagal episodes.  - Continue Coreg, watch rhythm.  10. Thrombocytopenia: Resolved  11. FEN: Continue tube feeds.  12. Ischemic digits: LUE.  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  Repeat study 1/17 showed no obstruction.  - Wound Care following. 13. ID: Group F Strep and Enterococcus faecalis in sputum. Initially completed abx for these bacteria. Now with recurrent sepsis, Enterococcus faecalis in blood now. Vancomycin restarted. TEE 2/7 with no vegetation.  - Continue vancomycin to 2/21.   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   - resolved 15. Hypernatremia: Resolved. Continue free water. No change 16. Hematuria: Resolved with foley change.   CRITICAL CARE Performed by: Marca Ancona  Total critical care time: 35 minutes  Critical care time was exclusive of separately billable procedures and treating other patients.  Critical care was necessary to treat or prevent imminent or life-threatening deterioration.  Critical care was time spent personally by me on the following activities: development of treatment plan with patient and/or surrogate as well as nursing, discussions with consultants, evaluation of patient's response to treatment, examination of patient, obtaining history from patient or surrogate, ordering and performing treatments and interventions, ordering and review of laboratory studies, ordering and review of radiographic studies, pulse oximetry and re-evaluation of patient's condition.    Length of Stay: 32  Marca Ancona, MD  04/29/2020, 7:35 AM  Advanced Heart Failure Team Pager [phone] (M-F; 7a - 4p)  Please contact [facility] for night-coverage after hours (4p -7a ) and weekends on [email or web address]

## 2020-04-29 NOTE — Progress Notes (Signed)
ANTICOAGULATION CONSULT NOTE  Pharmacy Consult for bivalirudin Indication: ECMO and VTE  Labs: Recent Labs    04/27/20 0421 04/27/20 0622 04/28/20 0418 04/28/20 0420 04/28/20 1652 04/28/20 1949 04/29/20 0403 04/29/20 0409  HGB 9.6*   < > 8.7*   < > 9.1* 9.5* 8.7* 8.8*  HCT 30.0*   < > 25.7*   < > 27.6* 28.0* 27.2* 26.0*  PLT 176   < > 176  --  196  --  189  --   APTT 64*   < > 64*  --  65*  --  62*  --   LABPROT 17.5*  --  17.3*  --   --   --  17.1*  --   INR 1.5*  --  1.5*  --   --   --  1.4*  --   CREATININE 0.63   < > 0.59*  --  0.59*  --  0.67  --    < > = values in this interval not displayed.    Assessment: 82 yoM admitted with COVID-19 PNA with worsening hypoxia, s/p cannulation for ECMO. Pt was started on IV heparin prior to cannulation due to acute DVTs and possible PE, transitioned to bivalirudin with ECMO. Now s/p tPA on 1/5 and tracheostomy on 1/6. Circuit last changed 1/26.  aPTT continue to be therapeutic at 62 sec. No bleeding reported. LDH stable 400s. Hgb stable 8.8, plt wnl.  Goal of Therapy:  aPTT 60-80 seconds   Plan:  -Continue bivalirudin at 0.085 mg/kg/hr (using order-specific wt 72.1kg) -Monitor q12h aPTT/CBC, LDH, and for s/sx of bleeding  Sheppard Coil PharmD., BCPS Clinical Pharmacist 04/29/2020 8:00 AM

## 2020-04-29 NOTE — Progress Notes (Signed)
ECMO PROGRESS NOTE  NAME:  Alex [name], MRN:  [id number], DOB:  1971/01/20, LOS: 51 ADMISSION DATE:  03/05/2020, CONSULTATION DATE: March 21, 2020 REFERRING MD: Wynona Neat -LBPCCM, CHIEF COMPLAINT: Respiratory failure requiring ECMO  HPI/course in hospital  50 year old man admitted to hospital 12/28 with 1 week history of dyspnea cough nausea and vomiting.  Initially admitted to [facility] long hospital and placed on high flow nasal cannula but rapidly failed and required intubation 12/29.  Persistent hypoxic respiratory failure with PF ratio 55 in spite of 18 of PEEP FiO2 0.1 despite paralytics.  Did not improve with prone ventilation  Cannulated for VV ECMO 12/30 via right IJ crescent cannula.  ECMO circuit was changed on 04/07/2020 Iatrogenic pneumothorax from left subclavian triple-lumen placement  12/28 admitted covid, ARDS 12/30 ECMO cannulation, iatrogenic pneumothrorax, DVT, Bivalrudin started           Left subclavian hematoma, chest tube, ceftriaxone/ azithromycin started 12/31 1Unit PRBC 1/1 Palliative consult, noted HTN, fentanyl only 1/2 Extubation I/O positive, left lung re-expanded, EF 60-65%, Lasix 20mg  BID 1/3 BiPAP in place, HTN to 190s, 200s, labetolol for BP, I/O even, cefepime and vancomycin started 1/4 agitation off BiPAP, possible aspiration 1/5 agitation overnight, reintubated      ECHO with RV dilation, ECMO cannula repositioned, LUE DVT (+), 1/2 dose tPA given,      I/O up, on lasix, Epi, NE, vasopressin started, HR/ rhythm issues noted 1/6 Tracheostomy, hypoglycemic when off tube feeds       Cortrack tune issues 1/7 on lasix gtt 4mg /hr with diuresis, agitation improved 1/8 starting steroid taper 1/9 severe agitation, heavy sedation required, lasix gtt 6mg /hr, I/Os (-)       precedex and fentanyl 1/10 Vanc stopped 1/11 sweep to 2, lasix gtt at 4mg /hr, weight up, metoprolol added for HTN 1/12 fevers to 99 noted, lasix gtt and metazolone 1/13 lasix gtt dced 1/14  intermittent HTN, possibly flash pulmonary edema tied to sedation        Ischemic left hand changes, 1 Unit PRBCs 1/15 1/16 sweep from 4 to 2.5, chest tube removed 1/17 sweep at 6, trial of nebulized morphine for cough, LUE dopplers show no obstruction 1/18 two events of 2nd degree type II HB noted with coughing         sweep at 4, IV lasix 40, acetazolamide, distal XLT trach placement         agitation and BP spikes 1/19 agitation, air hunger, seroquel, klonopin, oxy, valproate, ketamine trial 1/20 sweep at 3.5, diuresis results in chugging, albumin given         1 Unit PRBCs, lidocaine nebulizers for cough, ancef started 1/21 sweep to 8, anxiety, I/Os (+) with lasix and acetazolamide 1/22 sweep at 5, chest CT bilateral upper lobe PEs 1/23 sweep at 5, delirium, I/Os even with lasix and acetazolamide         continued coughing, 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 1/24 sweep at 7.5, 1 Unit PRBCs, repeat bronchoscopy showed resolution of 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 1/25 sweep at 7.5, on propofol, lasix gtt at 46ml/hr, I/Os (+)         nebulized morphine and lidocaine for cough 1/26 sweep at 8, propofol/ precedex, cough present when weaned, lasix gtt at 39ml/hr        ECMO circuit changes, levophed started, 1 Unit PRBCs 1/27 sweep at 4, off sedation, delirium, I/Os even        lasix gtt restated, acetazolamide overnight 1/28 sweep at 7.5, HTN  labile 1/29 sweep of 6, patient reports a tickle in throat, cetacaine 1/30 sweep down to 6, cough improved with cetacaine and gabapentin 1/31 sweep at 5, agitation, off acetazolamide, lasix, weight up 2/1 cough, of pulmicort, cetacaine for cough, continued gabapentin, dexmethorphan       CT demonstrated trach placement against back of trachea 2/2 Trach collar trial somewhat effective in managing cough       low dose diltiazem for HR,  2/3 sweep at 4,trach cannula replacement (longer, more flexible bivona); brochoscopy shows  irritated mucosa       IV lasix and acetazolamide 2/4 sweep at 5, I/Os (+), BUN elevated, lasix gtt with diamox,       trach decannulation, increased gabapentin for cough 2/5 start HCAP coverage for rising WBC/temps, check Pct/cultures, CXR worse with lack of PEEP, diuresed well but started getting very hypotensive, sepsis vs. Too dry 04/18/20 blood cx grew E faecalis 2/7 PICC change, TEE  Past Medical History  none  Interim history/subjective:  No events.  Slept worse last night due to a nagging cough.  Objective   Blood pressure (!) 141/63, pulse 89, temperature 99.1 F (37.3 C), temperature source Core, resp. rate 15, height 5\' 4"  (1.626 m), weight 71.9 kg, SpO2 100 %.        Intake/Output Summary (Last 24 hours) at 04/29/2020 0843 Last data filed at 04/29/2020 0800 Gross per 24 hour  Intake 2604.8 ml  Output 3960 ml  Net -1355.2 ml   Filed Weights   04/27/20 0620 04/28/20 0640 04/29/20 0500  Weight: 74.2 kg 72.7 kg 71.9 kg    Examination: Constitutional: no acute distress sitting in chair  Eyes: EOMI, pupils equal Ears, nose, mouth, and throat: trach site dressed without audible leak Cardiovascular: RRR, ext warm Respiratory: basilar crackles, no accessory muscle use Gastrointestinal: Soft, +BS, rectal tube in place Skin: No rashes, normal turgor Neurologic: moves all 4 ext to command Psychiatric: RASS 0  CBC stable LDH stable Coags stable Sugars high, insulin adjusted  Assessment & Plan:  Acute hypoxemic/hypercapnic respiratory failure due to severe ARDS from COVID-19 pneumonia Probable acute PE, and RV dysfunction s/p TPA Refractory coughing- improved after trach removal but still an issue Strep group F/ enterococcal pneumonia- s/p 10 days vanc 1/29; recurrent with bacteremia on 2/5. R effusion- lymphocyte predominant transudate drained 2/16 - Continue VV ECMO, sweep weans titrated to WOB/ABG - Cough regimen as ordered - Continue supplemental oxygen via nasal  cannula, can decrease as his oxygen improves. - IS, flutter, OOB mobility to prevent muscle atrophy - Keep even with diuresis: lasix and acetazolamide BID - Today: trial of BIPAP, if tolerates will try this qHS and PRN to help with sweep wean  HTN.  Trials of reducing HR/BP have resulted in reduced pCO2 (likely related to fractional flow through ECMO circuit) -Continue coreg, clonidine at current doses for now  Acute delirium, with agitation- improved. -RASS goal -1 to 0, PRN, morphine PRN, oxycodone 15mg > now q8h, qHS seroquel, klonipin BID 1mg  -Plans to slowly wean this over next couple weeks: klonipin lowered 2/16  Sepsis secondary to E faecalis bacteremia- secondary to PNA, has grown enterococcus in sputum prior, previous vanc course completed 1/29. TEE on 2/7 without vegetations. PICC changed on 2/7. -Continue vanc through 2/21 per ID  Gastroparesis with some element ileus: improved but occasional vomiting spells with coughing attacks.  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  - wound care following, appreciate dressing recommendations - keep A-line out of left arm   R arm swelling- duplex neg, NTD here, improved  Hyperglycemia due to diabetes type II -Basal bolus insulin adjusted (AM levemir added), goal CBG 100-180  Acute urinary retention; no evidence of UTI based on UA Hematuria -failed condom cath trial, foley replaced 2/7; again on 2/11 -increase mobility -continue cardura - rechallenge with voiding trial tomorrow  Deconditioning -con't working with PT, OT, SLP   Daily Goals Checklist  Pain/Anxiety/Delirium protocol (if indicated): see above VAP protocol (if indicated):  n/a DVT prophylaxis: bivalirudin GI prophylaxis: pantoprazole Glucose control: basal bolus insulin Mobility/therapy needs: Mobilization as  tolerated Code Status: Full code Disposition: ICU  Patient critically ill due to ARDS Interventions to address this today ECMO titration Risk of deterioration without these interventions is high  I personally spent 34 minutes providing critical care not including any separately billable procedures  Myrla Halsted MD [facility] 04/12/2020 7:23 AM Prefer epic messenger for cross cover needs If after hours, please call E-link

## 2020-04-29 NOTE — Progress Notes (Signed)
Physical Therapy Treatment Patient Details Name: Alex [name] MRN: [id number] DOB: October 20, 1970 Today's Date: 04/29/2020    History of Present Illness Pt is 50 y.o. male  with no significant past medical history admitted on 02/11/2020 with dyspnea, cough, nausea/vomiting ~ 1 week ago with worsening symptoms of body aches and fatigue and +COVID 02/07/20 and admitted with shortness of breath. Required intubation 03/10/20. Cannulated for VV ECMO Mar 23, 2020. Oxygenating better with ECMO. Also evidence of RLE DVT, RV thrombus, and high suspicion of PE. Has required chest tube to L lung for collapse which needs further reposition with recurrent collapse. Extubated 03-15-19.  Reintubated 1/5 and trach placed 1/6. Decannulated 04/16/20.    PT Comments    Pt agreeable to progress ambulation today, he is noted to be slightly more fatigued at start of session. Pt able to ambulate a total of approximately 64 feet in 3 bouts with maxAx2, and 3+ for ECMO and line and lead management. Able to continue to work on widening BoS and improving hip rotation to maximize step length and LE clearance with swing through. Pt team excited to take pt for field trip outside after walking, so deferred core and hip Abductor strengthening until tomorrow.     Follow Up Recommendations  CIR;Supervision/Assistance - 24 hour     Equipment Recommendations  Other (comment) (TBD (pt still on ECMO))       Precautions / Restrictions Precautions Precautions: Fall Precaution Comments: ECMO, NGtube, Foley, Pt decannulated himself 2/4 Restrictions Weight Bearing Restrictions: No Other Position/Activity Restrictions: L platform walker used for mobility due to discomfort with grip to L hand    Mobility  Bed Mobility Overal bed mobility: Needs Assistance Bed Mobility: Sit to Supine     Supine to sit: +2 for physical assistance;Min assist;Mod assist          Transfers Overall transfer level: Needs assistance Equipment used: Left  platform walker Transfers: Sit to/from Stand;Stand Pivot Transfers Sit to Stand: +2 physical assistance;+2 safety/equipment;Min assist;Mod assist         General transfer comment: min-modAx2 for 3x power up into standing from recliner, vc for scooting hips forward and placement of LE  Ambulation/Gait Ambulation/Gait assistance: Max assist;Total assist;Mod assist Gait Distance (Feet): 64 Feet (18-28-18) Assistive device: Left platform walker Gait Pattern/deviations: Step-to pattern;Step-through pattern;Decreased step length - right;Decreased step length - left;Wide base of support;Narrow base of support;Trunk flexed;Drifts right/left;Shuffle;Decreased weight shift to right;Decreased weight shift to left Gait velocity: slowed Gait velocity interpretation: <1.31 ft/sec, indicative of household ambulator General Gait Details: 3 bouts of ambulation today, pt noted to require increased recovery time and increased cuing for purse lip breathing during seated rest breaks, Maximal multimodal cuing for wider BoS, upright posture, and pelvic rotation.         Balance Overall balance assessment: Needs assistance Sitting-balance support: No upper extremity supported;Feet unsupported Sitting balance-Leahy Scale: Fair     Standing balance support: Bilateral upper extremity supported;During functional activity Standing balance-Leahy Scale: Poor Standing balance comment: requires outside support to maintain balance in standing                            Cognition Arousal/Alertness: Awake/alert Behavior During Therapy: Flat affect Overall Cognitive Status: Within Functional Limits for tasks assessed  General Comments General comments (skin integrity, edema, etc.): 2H staff understandably excited about progress and request to have pt ambulate in cath lab and then were going to take pt outside, will work towards more focus on  strengthening and ambulation form      Pertinent Vitals/Pain Pain Assessment: No/denies pain           PT Goals (current goals can now be found in the care plan section) Acute Rehab PT Goals Patient Stated Goal: continue to walk PT Goal Formulation: With patient Time For Goal Achievement: 05/10/20 Potential to Achieve Goals: Fair Progress towards PT goals: Progressing toward goals    Frequency    Min 5X/week      PT Plan Current plan remains appropriate       AM-PAC PT "6 Clicks" Mobility   Outcome Measure  Help needed turning from your back to your side while in a flat bed without using bedrails?: A Lot Help needed moving from lying on your back to sitting on the side of a flat bed without using bedrails?: A Lot Help needed moving to and from a bed to a chair (including a wheelchair)?: A Lot Help needed standing up from a chair using your arms (e.g., wheelchair or bedside chair)?: A Lot Help needed to walk in hospital room?: A Lot Help needed climbing 3-5 steps with a railing? : Total 6 Click Score: 11    End of Session Equipment Utilized During Treatment: Gait belt Activity Tolerance: Patient tolerated treatment well Patient left: in chair;with call bell/phone within reach;with nursing/sitter in room Nurse Communication: Mobility status;Other (comment) (scheduled approx 11:30 for daily therapy) PT Visit Diagnosis: Muscle weakness (generalized) (M62.81);Difficulty in walking, not elsewhere classified (R26.2);Other abnormalities of gait and mobility (R26.89);Unsteadiness on feet (R26.81)     Time: 4628-6381 PT Time Calculation (min) (ACUTE ONLY): 59 min  Charges:  $Gait Training: 38-52 mins $Therapeutic Activity: 8-22 mins                     Breniya Goertzen B. Beverely Risen PT, DPT Acute Rehabilitation Services Pager [phone] Office [phone]    [facility] 04/29/2020, 3:43 PM

## 2020-04-29 NOTE — Progress Notes (Signed)
ABG values are on 6L of supplemental oxygen. 100% delivery via ECMO mode of therapy. Sweep Gas: 4.5L Pump Speed: 3500 RPM's   04/29/2020 04:09  Sample type ARTERIAL  pH, Arterial 7.338 (L)  pCO2 arterial 64.4 (H)  pO2, Arterial 127 (H)  TCO2 36 (H)  Acid-Base Excess 7.0 (H)  Bicarbonate 34.5 (H)  O2 Saturation 98.0    Goal of care: Maintain good ventilation and optimize oxygenation via utilization of the VV ECMO mode of therapy to meet systemic and myocardial oxygen demands to assure adequate tissue perfusion, all while assuring and maintaining circuit patency free from thrombin and obstruction Impedence.  At this time patient is meeting PTT goals which are therapeutic. ECMO Specialist/Primary RN has been monitoring patient clinical presentation, respiratory status, and all hemodynamic parameters throughout the night. Saturations have been stable/acceptable throughout the shift. No complications noted.    Genesys Coggeshall L. Katrinka Blazing, BS, RRT, RCP

## 2020-04-29 NOTE — Progress Notes (Signed)
Assisted tele visit to patient with son.  Alex Marchetta Anderson, RN   

## 2020-04-29 NOTE — Plan of Care (Signed)
  Problem: Health Behavior/Discharge Planning: Goal: Ability to manage health-related needs will improve Outcome: Progressing   Problem: Clinical Measurements: Goal: Ability to maintain clinical measurements within normal limits will improve Outcome: Progressing Goal: Will remain free from infection Outcome: Progressing Goal: Diagnostic test results will improve Outcome: Progressing Goal: Respiratory complications will improve Outcome: Progressing Goal: Cardiovascular complication will be avoided Outcome: Progressing   Problem: Activity: Goal: Risk for activity intolerance will decrease Outcome: Progressing   Problem: Coping: Goal: Level of anxiety will decrease Outcome: Progressing   Problem: Pain Managment: Goal: General experience of comfort will improve Outcome: Progressing   Problem: Elimination: Goal: Will not experience complications related to bowel motility Outcome: Progressing Goal: Will not experience complications related to urinary retention Outcome: Progressing   Problem: Safety: Goal: Ability to remain free from injury will improve Outcome: Progressing   Problem: Skin Integrity: Goal: Risk for impaired skin integrity will decrease Outcome: Progressing   Problem: Education: Goal: Knowledge of risk factors and measures for prevention of condition will improve Outcome: Progressing

## 2020-04-29 NOTE — Progress Notes (Signed)
Patient ID: Parley Pidcock, male   DOB: [birth year], 50 y.o.   MRN: [id number]  This NP visited patient at the bedside as a follow up for palliative medcien needs and emotional support.  Medical records reviewed.  Discussed with treatment team  49 y.o.malewith no significantpast medical history admitted on12/28/2021with dyspnea, cough, nausea/vomiting ~ 1 week ago with worsening symptoms of body aches and fatigue and +COVID 02/07/20 and admitted with shortness of breath.Required intubation 03/10/20. Cannulated for VV ECMO 03/12/2020. Oxygenating better with ECMO. Also evidence of RLE DVT, RV thrombus, and high suspicion of PE. Has required chest tube to L lung for collapse which needs further reposition with recurrent collapse.  Today is day 22 of this hospitalization.  Patient is  independent from  ventilator, remains on ECMO.      Patient is critically ill and making  slow prognosis, he continues to work with therapies    He remains  high risk for decompensation  Created space and opportunity for [name] to explore his thoughts and feelings regarding his current medical situation.  He verbalizes mostly concerns around financial responsibilities. Emotional support and attempted reassurance to decrease concerns.  I let him know I would be sure to communicate his concerns with his friend Zollie Beckers  I spoke to Willaim Sheng, he is aware and working on things.   Education offered today with patient and  Zollie Beckers the importance of continued conversations with each other  and the medical providers regarding overall plan of care and treatment options,  ensuring decisions are within the context of the patients values and GOCs.    Patient is seriously ill and high risk for decompensation.  Questions and concerns addressed   Discussed with bedside RN       PMT will continue to support holistically  Total time spent on the unit was 25 minutes  Greater than 50% of the time was spent in counseling and  coordination of care  Lorinda Creed NP  Palliative Medicine Team Team Phone # [phone] Pager [phone]

## 2020-04-29 NOTE — Procedures (Signed)
Extracorporeal support note  ECLS cannulation date: 2020-04-02 Last circuit change: 04/07/2020  Indication: Acute hypoxic respiratory failure due to ARDS from COVID-19 pneumonia with RV dysfunction.   Configuration: Venovenous  Drainage cannula: 32 French crescent cannula via right IJ Return cannula: Same  Pump speed: 3500 RPM Pump flow: 4.84 L/min Pump used: Cardio help  Oxygenator: Cardio help O2 blender: 100% Sweep gas: 5 L  Circuit check: minimal thrombin in circuit, no clots, unchanged Anticoagulant: Bivalirudin Anticoagulation targets: PTT 60-80  Changes in support: continue mobility as tolerated to prevent muscle atrophy, slowly wean down on sedating meds, wean sweep as tolerated by WOB and ABG, trial of BIPAP  Anticipated goals/duration of support: Bridge to recovery   Multidisciplinary ECMO rounds completed.  Lorin Glass, MD 04/29/20 8:53 AM [facility]

## 2020-04-29 NOTE — Progress Notes (Signed)
CSW received call from [place] with [facility]. Amil Amen had questions regarding patients insurance. CSW referred her to financial counseling. No further questions reported at this time.

## 2020-04-29 NOTE — Progress Notes (Signed)
  Speech Language Pathology Treatment: Dysphagia  Patient Details Name: Alex [name] MRN: [id number] DOB: 06/26/1970 Today's Date: 04/29/2020 Time: 1200-1230 SLP Time Calculation (min) (ACUTE ONLY): 30 min  Assessment / Plan / Recommendation Clinical Impression  Pt demonstrates adequate tolerance of thin liquids and regular solids. He benefits from his food being cut up, but does not necessarily need mechanical soft foods. Will upgrade to regular to allow pt to officially have foods of choice since this is a motivator for him. There are otherwise no further SLP needs at this time. His cognition is adequate for his level of activity. He may benefit from f/u cognitive interventions as he liberates from ECMO and becomes for independent and active. Will sign off for now.   HPI HPI: 50 year old man admitted to hospital 12/28 with 1 week history of dyspnea cough nausea and vomiting.  Initially admitted to [facility] long hospital and placed on high flow nasal cannula but rapidly failed and required intubation 12/29.  Persistent hypoxic respiratory failure with PF ratio 55 in spite of 18 of PEEP FiO2 0.1 despite paralytics.  Did not improve with prone ventilation. Cannulated for VV ECMO 12/30 via right IJ crescent cannula. Iatrogenic pneumothorax from left subclavian triple-lumen placement. Trach on 1/6. Decannulated 04/16/20      SLP Plan  All goals met       Recommendations  Diet recommendations: Regular;Thin liquid Supervision: Trained caregiver to feed patient Compensations: Slow rate;Small sips/bites Postural Changes and/or Swallow Maneuvers: Seated upright 90 degrees                Oral Care Recommendations: Oral care BID Follow up Recommendations: Inpatient Rehab SLP Visit Diagnosis: Dysphagia, oropharyngeal phase (R13.12) Plan: All goals met       GO               Herbie Baltimore, MA CCC-SLP  Acute Rehabilitation Services Pager [phone] Office [phone]  Lynann Beaver 04/29/2020, 1:54 PM

## 2020-04-29 NOTE — Progress Notes (Signed)
ANTICOAGULATION CONSULT NOTE  Pharmacy Consult for bivalirudin Indication: ECMO and VTE  Labs: Recent Labs    04/27/20 0421 04/27/20 0622 04/28/20 0418 04/28/20 0420 04/28/20 1652 04/28/20 1949 04/29/20 0403 04/29/20 0409 04/29/20 0840 04/29/20 1607 04/29/20 1609  HGB 9.6*   < > 8.7*   < > 9.1*   < > 8.7*   < > 9.5* 8.5* 8.8*  HCT 30.0*   < > 25.7*   < > 27.6*   < > 27.2*   < > 28.0* 25.3* 26.0*  PLT 176   < > 176  --  196  --  189  --   --  200  --   APTT 64*   < > 64*  --  65*  --  62*  --   --  66*  --   LABPROT 17.5*  --  17.3*  --   --   --  17.1*  --   --   --   --   INR 1.5*  --  1.5*  --   --   --  1.4*  --   --   --   --   CREATININE 0.63   < > 0.59*  --  0.59*  --  0.67  --   --   --   --    < > = values in this interval not displayed.    Assessment: 89 yoM admitted with COVID-19 PNA with worsening hypoxia, s/p cannulation for ECMO. Pt was started on IV heparin prior to cannulation due to acute DVTs and possible PE, transitioned to bivalirudin with ECMO. Now s/p tPA on 1/5 and tracheostomy on 1/6. Circuit last changed 1/26.  aPTT continue to be therapeutic at 66 sec. No bleeding reported. LDH stable 400s. Hgb stable 8.8, plt wnl.  Goal of Therapy:  aPTT 60-80 seconds   Plan:  -Continue bivalirudin at 0.085 mg/kg/hr (using order-specific wt 72.1kg) -Monitor q12h aPTT/CBC, LDH, and for s/sx of bleeding   Leia Alf, PharmD, BCPS Please check AMION for all [facility] contact numbers Clinical Pharmacist 04/29/2020 5:01 PM

## 2020-04-30 ENCOUNTER — Inpatient Hospital Stay (HOSPITAL_COMMUNITY): Payer: Medicaid Other

## 2020-04-30 LAB — POCT I-STAT 7, (LYTES, BLD GAS, ICA,H+H)
Acid-Base Excess: 7 mmol/L — ABNORMAL HIGH (ref 0.0–2.0)
Acid-Base Excess: 6 mmol/L — ABNORMAL HIGH (ref 0.0–2.0)
Acid-Base Excess: 9 mmol/L — ABNORMAL HIGH (ref 0.0–2.0)
Acid-Base Excess: 7 mmol/L — ABNORMAL HIGH (ref 0.0–2.0)
Acid-Base Excess: 8 mmol/L — ABNORMAL HIGH (ref 0.0–2.0)
Bicarbonate: 33.8 mmol/L — ABNORMAL HIGH (ref 20.0–28.0)
Bicarbonate: 33.9 mmol/L — ABNORMAL HIGH (ref 20.0–28.0)
Bicarbonate: 36 mmol/L — ABNORMAL HIGH (ref 20.0–28.0)
Bicarbonate: 34.9 mmol/L — ABNORMAL HIGH (ref 20.0–28.0)
Bicarbonate: 35.7 mmol/L — ABNORMAL HIGH (ref 20.0–28.0)
Calcium, Ion: 1.24 mmol/L (ref 1.15–1.40)
Calcium, Ion: 1.27 mmol/L (ref 1.15–1.40)
Calcium, Ion: 1.27 mmol/L (ref 1.15–1.40)
Calcium, Ion: 1.29 mmol/L (ref 1.15–1.40)
Calcium, Ion: 1.26 mmol/L (ref 1.15–1.40)
HCT: 24 % — ABNORMAL LOW (ref 39.0–52.0)
HCT: 28 % — ABNORMAL LOW (ref 39.0–52.0)
HCT: 25 % — ABNORMAL LOW (ref 39.0–52.0)
HCT: 28 % — ABNORMAL LOW (ref 39.0–52.0)
HCT: 27 % — ABNORMAL LOW (ref 39.0–52.0)
Hemoglobin: 8.2 g/dL — ABNORMAL LOW (ref 13.0–17.0)
Hemoglobin: 9.5 g/dL — ABNORMAL LOW (ref 13.0–17.0)
Hemoglobin: 8.5 g/dL — ABNORMAL LOW (ref 13.0–17.0)
Hemoglobin: 9.5 g/dL — ABNORMAL LOW (ref 13.0–17.0)
Hemoglobin: 9.2 g/dL — ABNORMAL LOW (ref 13.0–17.0)
O2 Saturation: 98 %
O2 Saturation: 97 %
O2 Saturation: 100 %
O2 Saturation: 99 %
O2 Saturation: 99 %
Patient temperature: 37.1
Patient temperature: 37
Patient temperature: 37
Patient temperature: 97.7
Potassium: 3.8 mmol/L (ref 3.5–5.1)
Potassium: 4 mmol/L (ref 3.5–5.1)
Potassium: 4.2 mmol/L (ref 3.5–5.1)
Potassium: 4.1 mmol/L (ref 3.5–5.1)
Potassium: 3.7 mmol/L (ref 3.5–5.1)
Sodium: 134 mmol/L — ABNORMAL LOW (ref 135–145)
Sodium: 135 mmol/L (ref 135–145)
Sodium: 135 mmol/L (ref 135–145)
Sodium: 136 mmol/L (ref 135–145)
Sodium: 135 mmol/L (ref 135–145)
TCO2: 36 mmol/L — ABNORMAL HIGH (ref 22–32)
TCO2: 36 mmol/L — ABNORMAL HIGH (ref 22–32)
TCO2: 38 mmol/L — ABNORMAL HIGH (ref 22–32)
TCO2: 37 mmol/L — ABNORMAL HIGH (ref 22–32)
TCO2: 38 mmol/L — ABNORMAL HIGH (ref 22–32)
pCO2 arterial: 57.9 mmHg — ABNORMAL HIGH (ref 32.0–48.0)
pCO2 arterial: 72.5 mmHg (ref 32.0–48.0)
pCO2 arterial: 69.7 mmHg (ref 32.0–48.0)
pCO2 arterial: 71.1 mmHg (ref 32.0–48.0)
pCO2 arterial: 70 mmHg (ref 32.0–48.0)
pH, Arterial: 7.374 (ref 7.350–7.450)
pH, Arterial: 7.278 — ABNORMAL LOW (ref 7.350–7.450)
pH, Arterial: 7.322 — ABNORMAL LOW (ref 7.350–7.450)
pH, Arterial: 7.3 — ABNORMAL LOW (ref 7.350–7.450)
pH, Arterial: 7.313 — ABNORMAL LOW (ref 7.350–7.450)
pO2, Arterial: 103 mmHg (ref 83.0–108.0)
pO2, Arterial: 110 mmHg — ABNORMAL HIGH (ref 83.0–108.0)
pO2, Arterial: 224 mmHg — ABNORMAL HIGH (ref 83.0–108.0)
pO2, Arterial: 182 mmHg — ABNORMAL HIGH (ref 83.0–108.0)
pO2, Arterial: 137 mmHg — ABNORMAL HIGH (ref 83.0–108.0)

## 2020-04-30 LAB — BASIC METABOLIC PANEL
Anion gap: 9 (ref 5–15)
Anion gap: 8 (ref 5–15)
BUN: 33 mg/dL — ABNORMAL HIGH (ref 6–20)
BUN: 29 mg/dL — ABNORMAL HIGH (ref 6–20)
CO2: 29 mmol/L (ref 22–32)
CO2: 31 mmol/L (ref 22–32)
Calcium: 8.2 mg/dL — ABNORMAL LOW (ref 8.9–10.3)
Calcium: 8.7 mg/dL — ABNORMAL LOW (ref 8.9–10.3)
Chloride: 94 mmol/L — ABNORMAL LOW (ref 98–111)
Chloride: 95 mmol/L — ABNORMAL LOW (ref 98–111)
Creatinine, Ser: 0.73 mg/dL (ref 0.61–1.24)
Creatinine, Ser: 0.68 mg/dL (ref 0.61–1.24)
GFR, Estimated: >60 mL/min (ref >60)
GFR, Estimated: >60 mL/min (ref >60)
Glucose, Bld: 219 mg/dL — ABNORMAL HIGH (ref 70–99)
Glucose, Bld: 127 mg/dL — ABNORMAL HIGH (ref 70–99)
Potassium: 3.7 mmol/L (ref 3.5–5.1)
Potassium: 4.2 mmol/L (ref 3.5–5.1)
Sodium: 132 mmol/L — ABNORMAL LOW (ref 135–145)
Sodium: 134 mmol/L — ABNORMAL LOW (ref 135–145)

## 2020-04-30 LAB — GLUCOSE, CAPILLARY
Glucose-Capillary: 125 mg/dL — ABNORMAL HIGH (ref 70–99)
Glucose-Capillary: 171 mg/dL — ABNORMAL HIGH (ref 70–99)
Glucose-Capillary: 182 mg/dL — ABNORMAL HIGH (ref 70–99)
Glucose-Capillary: 206 mg/dL — ABNORMAL HIGH (ref 70–99)
Glucose-Capillary: 224 mg/dL — ABNORMAL HIGH (ref 70–99)
Glucose-Capillary: 253 mg/dL — ABNORMAL HIGH (ref 70–99)

## 2020-04-30 LAB — CBC
HCT: 23.9 % — ABNORMAL LOW (ref 39.0–52.0)
HCT: 26.3 % — ABNORMAL LOW (ref 39.0–52.0)
Hemoglobin: 8.1 g/dL — ABNORMAL LOW (ref 13.0–17.0)
Hemoglobin: 8.1 g/dL — ABNORMAL LOW (ref 13.0–17.0)
MCH: 31.6 pg (ref 26.0–34.0)
MCH: 29.3 pg (ref 26.0–34.0)
MCHC: 33.9 g/dL (ref 30.0–36.0)
MCHC: 30.8 g/dL (ref 30.0–36.0)
MCV: 93.4 fL (ref 80.0–100.0)
MCV: 95.3 fL (ref 80.0–100.0)
Platelets: 202 10*3/uL (ref 150–400)
Platelets: 208 10*3/uL (ref 150–400)
RBC: 2.56 MIL/uL — ABNORMAL LOW (ref 4.22–5.81)
RBC: 2.76 MIL/uL — ABNORMAL LOW (ref 4.22–5.81)
RDW: 17.3 % — ABNORMAL HIGH (ref 11.5–15.5)
RDW: 17.1 % — ABNORMAL HIGH (ref 11.5–15.5)
WBC: 10.4 10*3/uL (ref 4.0–10.5)
WBC: 9.6 10*3/uL (ref 4.0–10.5)
nRBC: 0.2 % (ref 0.0–0.2)
nRBC: 0 % (ref 0.0–0.2)

## 2020-04-30 LAB — PROTIME-INR
INR: 1.4 — ABNORMAL HIGH (ref 0.8–1.2)
Prothrombin Time: 16.7 seconds — ABNORMAL HIGH (ref 11.4–15.2)

## 2020-04-30 LAB — APTT
aPTT: 64 seconds — ABNORMAL HIGH (ref 24–36)
aPTT: 65 seconds — ABNORMAL HIGH (ref 24–36)

## 2020-04-30 LAB — FIBRINOGEN: Fibrinogen: 322 mg/dL (ref 210–475)

## 2020-04-30 LAB — LACTATE DEHYDROGENASE: LDH: 412 U/L — ABNORMAL HIGH (ref 98–192)

## 2020-04-30 LAB — LACTIC ACID, PLASMA
Lactic Acid, Venous: 0.9 mmol/L (ref 0.5–1.9)
Lactic Acid, Venous: 0.6 mmol/L (ref 0.5–1.9)

## 2020-04-30 MED ORDER — INSULIN DETEMIR 100 UNIT/ML ~~LOC~~ SOLN
10.0000 [IU] | Freq: Every day | SUBCUTANEOUS | Status: DC
  Administered 2020-04-30 – 2020-05-01 (×2): 10 [IU] via SUBCUTANEOUS
  Filled 2020-04-30 (×3): qty 0.1

## 2020-04-30 MED ORDER — POTASSIUM CHLORIDE 20 MEQ PO PACK
40.0000 meq | PACK | Freq: Once | ORAL | Status: AC
  Administered 2020-04-30: 40 meq
  Filled 2020-04-30: qty 2

## 2020-04-30 MED ORDER — GABAPENTIN 250 MG/5ML PO SOLN
300.0000 mg | Freq: Three times a day (TID) | ORAL | Status: AC
  Administered 2020-04-30 – 2020-05-04 (×14): 300 mg
  Filled 2020-04-30 (×14): qty 6

## 2020-04-30 MED ORDER — INSULIN ASPART 100 UNIT/ML ~~LOC~~ SOLN: 4.0000 [IU] | Freq: Three times a day (TID) | SUBCUTANEOUS | Status: DC

## 2020-04-30 MED ORDER — PIVOT 1.5 CAL PO LIQD
1000.0000 mL | ORAL | Status: DC
  Administered 2020-04-30 – 2020-05-05 (×6): 1000 mL

## 2020-04-30 MED ORDER — DILTIAZEM HCL 60 MG PO TABS
30.0000 mg | ORAL_TABLET | Freq: Two times a day (BID) | ORAL | Status: DC
  Administered 2020-04-30 – 2020-05-05 (×8): 30 mg via ORAL
  Filled 2020-04-30 (×11): qty 1

## 2020-04-30 MED ORDER — INSULIN DETEMIR 100 UNIT/ML ~~LOC~~ SOLN
20.0000 [IU] | Freq: Every day | SUBCUTANEOUS | Status: DC
  Filled 2020-04-30: qty 0.2

## 2020-04-30 MED ORDER — QUETIAPINE FUMARATE 50 MG PO TABS
75.0000 mg | ORAL_TABLET | Freq: Every day | ORAL | Status: DC
  Administered 2020-04-30: 75 mg
  Filled 2020-04-30: qty 1

## 2020-04-30 MED ORDER — CLONIDINE HCL 0.1 MG PO TABS
0.1000 mg | ORAL_TABLET | Freq: Every day | ORAL | Status: DC
  Administered 2020-04-30 – 2020-05-01 (×2): 0.1 mg
  Filled 2020-04-30 (×2): qty 1

## 2020-04-30 MED ORDER — INSULIN ASPART 100 UNIT/ML ~~LOC~~ SOLN
4.0000 [IU] | SUBCUTANEOUS | Status: DC
  Administered 2020-04-30 – 2020-05-06 (×18): 4 [IU] via SUBCUTANEOUS

## 2020-04-30 NOTE — Progress Notes (Signed)
ECMO PROGRESS NOTE  NAME:  Malvern Kadlec, MRN:  [id number], DOB:  03-Nov-1970, LOS: 52 ADMISSION DATE:  02/12/2020, CONSULTATION DATE: 02/12/2020 REFERRING MD: Wynona Neat -LBPCCM, CHIEF COMPLAINT: Respiratory failure requiring ECMO  HPI/course in hospital  50 year old man admitted to hospital 12/28 with 1 week history of dyspnea cough nausea and vomiting.  Initially admitted to [facility] long hospital and placed on high flow nasal cannula but rapidly failed and required intubation 12/29.  Persistent hypoxic respiratory failure with PF ratio 55 in spite of 18 of PEEP FiO2 0.1 despite paralytics.  Did not improve with prone ventilation  Cannulated for VV ECMO 12/30 via right IJ crescent cannula.  ECMO circuit was changed on 04/07/2020 Iatrogenic pneumothorax from left subclavian triple-lumen placement  12/28 admitted covid, ARDS 12/30 ECMO cannulation, iatrogenic pneumothrorax, DVT, Bivalrudin started           Left subclavian hematoma, chest tube, ceftriaxone/ azithromycin started 12/31 1Unit PRBC 1/1 Palliative consult, noted HTN, fentanyl only 1/2 Extubation I/O positive, left lung re-expanded, EF 60-65%, Lasix 20mg  BID 1/3 BiPAP in place, HTN to 190s, 200s, labetolol for BP, I/O even, cefepime and vancomycin started 1/4 agitation off BiPAP, possible aspiration 1/5 agitation overnight, reintubated      ECHO with RV dilation, ECMO cannula repositioned, LUE DVT (+), 1/2 dose tPA given,      I/O up, on lasix, Epi, NE, vasopressin started, HR/ rhythm issues noted 1/6 Tracheostomy, hypoglycemic when off tube feeds       Cortrack tune issues 1/7 on lasix gtt 4mg /hr with diuresis, agitation improved 1/8 starting steroid taper 1/9 severe agitation, heavy sedation required, lasix gtt 6mg /hr, I/Os (-)       precedex and fentanyl 1/10 Vanc stopped 1/11 sweep to 2, lasix gtt at 4mg /hr, weight up, metoprolol added for HTN 1/12 fevers to 99 noted, lasix gtt and metazolone 1/13 lasix gtt dced 1/14  intermittent HTN, possibly flash pulmonary edema tied to sedation        Ischemic left hand changes, 1 Unit PRBCs 1/15 1/16 sweep from 4 to 2.5, chest tube removed 1/17 sweep at 6, trial of nebulized morphine for cough, LUE dopplers show no obstruction 1/18 two events of 2nd degree type II HB noted with coughing         sweep at 4, IV lasix 40, acetazolamide, distal XLT trach placement         agitation and BP spikes 1/19 agitation, air hunger, seroquel, klonopin, oxy, valproate, ketamine trial 1/20 sweep at 3.5, diuresis results in chugging, albumin given         1 Unit PRBCs, lidocaine nebulizers for cough, ancef started 1/21 sweep to 8, anxiety, I/Os (+) with lasix and acetazolamide 1/22 sweep at 5, chest CT bilateral upper lobe PEs 1/23 sweep at 5, delirium, I/Os even with lasix and acetazolamide         continued coughing, 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 1/24 sweep at 7.5, 1 Unit PRBCs, repeat bronchoscopy showed resolution of 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 1/25 sweep at 7.5, on propofol, lasix gtt at 47ml/hr, I/Os (+)         nebulized morphine and lidocaine for cough 1/26 sweep at 8, propofol/ precedex, cough present when weaned, lasix gtt at 67ml/hr        ECMO circuit changes, levophed started, 1 Unit PRBCs 1/27 sweep at 4, off sedation, delirium, I/Os even        lasix gtt restated, acetazolamide overnight 1/28 sweep at 7.5, HTN  labile 1/29 sweep of 6, patient reports a tickle in throat, cetacaine 1/30 sweep down to 6, cough improved with cetacaine and gabapentin 1/31 sweep at 5, agitation, off acetazolamide, lasix, weight up 2/1 cough, of pulmicort, cetacaine for cough, continued gabapentin, dexmethorphan       CT demonstrated trach placement against back of trachea 2/2 Trach collar trial somewhat effective in managing cough       low dose diltiazem for HR,  2/3 sweep at 4,trach cannula replacement (longer, more flexible bivona); brochoscopy shows  irritated mucosa       IV lasix and acetazolamide 2/4 sweep at 5, I/Os (+), BUN elevated, lasix gtt with diamox,       trach decannulation, increased gabapentin for cough 2/5 start HCAP coverage for rising WBC/temps, check Pct/cultures, CXR worse with lack of PEEP, diuresed well but started getting very hypotensive, sepsis vs. Too dry 04/18/20 blood cx grew E faecalis 2/7 PICC change, TEE  Past Medical History  none  Interim history/subjective:  Slept poorly. Remains on 5L sweep Feels well otherwise Able to go outside yesterday.  Objective   Blood pressure (!) 146/78, pulse (!) 103, temperature 99.1 F (37.3 C), temperature source Core (Comment), resp. rate (!) 25, height 5\' 4"  (1.626 m), weight 74.5 kg, SpO2 100 %.    FiO2 (%):  [40 %] 40 %   Intake/Output Summary (Last 24 hours) at 04/30/2020 0802 Last data filed at 04/30/2020 0600 Gross per 24 hour  Intake 2229.58 ml  Output 2675 ml  Net -445.42 ml   Filed Weights   04/28/20 0640 04/29/20 0500 04/30/20 0600  Weight: 72.7 kg 71.9 kg 74.5 kg    Examination: Constitutional: no acute distress sitting in chair  Eyes: EOMI, pupils equal Ears, nose, mouth, and throat: trach site dressed without audible leak or strikethrough Cardiovascular: trachycardic, ext warm Respiratory: basilar crackles unchanged, no accessory muscle use Gastrointestinal: Soft, +BS, rectal tube in place Skin: No rashes, normal turgor, stable L hand ischemic changes Neurologic: moves all 4 ext to command Psychiatric: RASS 0, pleasant  CBC stable LDH stable Coags stable Sugars high, insulin adjusted again  Assessment & Plan:  Acute hypoxemic/hypercapnic respiratory failure due to severe ARDS from COVID-19 pneumonia Probable acute PE, and RV dysfunction s/p TPA Refractory coughing- improved after trach removal but still an issue Strep group F/ enterococcal pneumonia- s/p 10 days vanc 1/29; recurrent with bacteremia on 2/5. R effusion- lymphocyte  predominant transudate drained 2/16 - Continue VV ECMO, sweep weans titrated to WOB/ABG - Cough regimen as ordered - Continue supplemental oxygen via nasal cannula, can decrease as his oxygen improves. - IS, flutter, OOB mobility to prevent muscle atrophy - Keep even with diuresis: lasix and acetazolamide BID - Today: sweep wean, continue balanced diuresis, hopefully he can walk again.  Did not tolerate BIPAP yesterday.  HTN.  Trials of reducing HR/BP have resulted in reduced pCO2 (likely related to fractional flow through ECMO circuit) -Continue coreg, taper clonidine, start dilt; goal to keep lower MAPs and HR to increase fractional circuit flow  Acute delirium, with agitation- improved. -RASS goal -1 to 0, PRN, morphine PRN, oxycodone 15mg > now q8h, qHS seroquel, klonipin BID 1mg  -Plans to slowly wean this over next couple weeks: klonipin lowered 2/16  Sepsis secondary to E faecalis bacteremia- secondary to PNA, has grown enterococcus in sputum prior, previous vanc course completed 1/29. TEE on 2/7 without vegetations. PICC changed on 2/7. -Continue vanc through 2/21 per ID  Gastroparesis with some element ileus:  improved but occasional vomiting spells with coughing attacks.  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  - wound care following, appreciate dressing recommendations - keep A-line out of left arm   R arm swelling- duplex neg, NTD here, improved  Hyperglycemia due to diabetes type II -Basal bolus insulin adjusted (AM levemir increased), goal CBG 100-180  Acute urinary retention; no evidence of UTI based on UA Hematuria -failed condom cath trial, foley replaced 2/7; again on 2/11 -increase mobility -continue cardura - rechallenge with voiding trial today  Deconditioning -con't working with PT, OT, SLP, appreciate  help   Daily Goals Checklist  Pain/Anxiety/Delirium protocol (if indicated): see above VAP protocol (if indicated):  n/a DVT prophylaxis: bivalirudin GI prophylaxis: pantoprazole Glucose control: basal bolus insulin Mobility/therapy needs: Mobilization as tolerated Code Status: Full code Disposition: ICU  Patient critically ill due to ARDS Interventions to address this today ECMO titration Risk of deterioration without these interventions is high  I personally spent 38 minutes providing critical care not including any separately billable procedures  4/11 MD [facility] 04/12/2020 7:23 AM Prefer epic messenger for cross cover needs If after hours, please call E-link

## 2020-04-30 NOTE — Progress Notes (Signed)
ANTICOAGULATION CONSULT NOTE  Pharmacy Consult for bivalirudin Indication: ECMO and VTE  Labs: Recent Labs    04/28/20 0418 04/28/20 0420 04/29/20 0403 04/29/20 0409 04/29/20 1607 04/29/20 1609 04/29/20 1958 04/30/20 0420 04/30/20 0426  HGB 8.7*   < > 8.7*   < > 8.5*   < > 8.5* 8.1* 8.2*  HCT 25.7*   < > 27.2*   < > 25.3*   < > 25.0* 23.9* 24.0*  PLT 176   < > 189  --  200  --   --  202  --   APTT 64*   < > 62*  --  66*  --   --  64*  --   LABPROT 17.3*  --  17.1*  --   --   --   --  16.7*  --   INR 1.5*  --  1.4*  --   --   --   --  1.4*  --   CREATININE 0.59*   < > 0.67  --  0.63  --   --  0.73  --    < > = values in this interval not displayed.    Assessment: 54 yoM admitted with COVID-19 PNA with worsening hypoxia, s/p cannulation for ECMO. Pt was started on IV heparin prior to cannulation due to acute DVTs and possible PE, transitioned to bivalirudin with ECMO. Now s/p tPA on 1/5 and tracheostomy on 1/6. Circuit last changed 1/26.  aPTT continue to be therapeutic at 64 sec. No bleeding reported. LDH coming down 480 > 472 > 412. Hgb stable 8.2, plt wnl.  Goal of Therapy:  aPTT 60-80 seconds   Plan:  -Continue bivalirudin at 0.085 mg/kg/hr (using order-specific wt 72.1kg) -Monitor q12h aPTT/CBC, LDH, and for s/sx of bleeding   Reece Leader, Colon Flattery, [facility] Clinical Pharmacist  04/30/2020 8:02 AM   [facility] pharmacy phone numbers are listed on [email or web address]

## 2020-04-30 NOTE — Plan of Care (Signed)
  Problem: Health Behavior/Discharge Planning: Goal: Ability to manage health-related needs will improve Outcome: Progressing   Problem: Clinical Measurements: Goal: Ability to maintain clinical measurements within normal limits will improve Outcome: Progressing Goal: Will remain free from infection Outcome: Progressing Goal: Diagnostic test results will improve Outcome: Progressing Goal: Respiratory complications will improve Outcome: Progressing Goal: Cardiovascular complication will be avoided Outcome: Progressing   Problem: Activity: Goal: Risk for activity intolerance will decrease Outcome: Progressing   Problem: Coping: Goal: Level of anxiety will decrease Outcome: Progressing   Problem: Pain Managment: Goal: General experience of comfort will improve Outcome: Progressing   Problem: Elimination: Goal: Will not experience complications related to bowel motility Outcome: Progressing Goal: Will not experience complications related to urinary retention Outcome: Progressing   Problem: Safety: Goal: Ability to remain free from injury will improve Outcome: Progressing   Problem: Skin Integrity: Goal: Risk for impaired skin integrity will decrease Outcome: Progressing   Problem: Education: Goal: Knowledge of risk factors and measures for prevention of condition will improve Outcome: Progressing   Problem: Coping: Goal: Psychosocial and spiritual needs will be supported Outcome: Progressing   Problem: Respiratory: Goal: Will maintain a patent airway Outcome: Progressing Goal: Complications related to the disease process, condition or treatment will be avoided or minimized Outcome: Progressing   Problem: Activity: Goal: Ability to tolerate increased activity will improve Outcome: Progressing   Problem: Respiratory: Goal: Ability to maintain a clear airway and adequate ventilation will improve Outcome: Progressing   Problem: Role Relationship: Goal: Method  of communication will improve Outcome: Progressing   

## 2020-04-30 NOTE — Progress Notes (Signed)
Inpatient Diabetes Program Recommendations  AACE/ADA: New Consensus Statement on Inpatient Glycemic Control (2015)  Target Ranges:  Prepandial:   less than 140 mg/dL      Peak postprandial:   less than 180 mg/dL (1-2 hours)      Critically ill patients:  140 - 180 mg/dL   Lab Results  Component Value Date   GLUCAP 253 (H) 04/30/2020   HGBA1C 11.8 (H) 02/12/2020    Review of Glycemic Control Results for Alex [name], Alex [name] (MRN [id number]) as of 04/30/2020 12:02  Ref. Range 04/29/2020 19:56 04/30/2020 00:12 04/30/2020 04:18 04/30/2020 07:33  Glucose-Capillary Latest Ref Range: 70 - 99 mg/dL 482 (H) 707 (H) 867 (H) 253 (H)   Diabetes history: Type 2 Dm Current orders for Inpatient glycemic control: Novolog 0-20 units Q4H, Levemir 20 units QAM, 40 units QPm Pivot nocturnal tube feeds from 1800-0800  Inpatient Diabetes Program Recommendations:    Patient has not yet received Levemir 20 units QAM. Patient experienced low from 25 units on 2/15, concern for repeat risk of hypoglycemia.  Would reduce Levemir back to 10 units QAM and add Novolog 4 units for nocturnal tube feeds at 2000, 0000, 0400, 0800. If appropriate, may want to change diet to carb modified during day.   Thanks, Lujean Rave, MSN, RNC-OB Diabetes Coordinator [phone] (8a-5p)

## 2020-04-30 NOTE — Progress Notes (Signed)
Occupational Therapy Treatment Patient Details Name: Alex [name] MRN: [id number] DOB: 1971-01-28 Today's Date: 04/30/2020    History of present illness Pt is 50 y.o. male  with no significant past medical history admitted on 03/08/2020 with dyspnea, cough, nausea/vomiting ~ 1 week ago with worsening symptoms of body aches and fatigue and +COVID 02/07/20 and admitted with shortness of breath. Required intubation 03/10/20. Cannulated for VV ECMO 02/29/2020. Oxygenating better with ECMO. Also evidence of RLE DVT, RV thrombus, and high suspicion of PE. Has required chest tube to L lung for collapse which needs further reposition with recurrent collapse. Extubated 03-15-19.  Reintubated 1/5 and trach placed 1/6. Decannulated 04/16/20.   OT comments  Patient continues to progress with the quality of his mobility, activity tolerance, and generalized strength.  Deficits remain as listed below, and they continue to significantly impact independence with ADL and toileting skills.  OT to continue to follow in the acute setting, the next goals will be to practice transfer to [facility] for increased independence with toilet skills, and begin seated grooming/UB ADL tasks.  Given encouraging progress towards goals, and patient's motivation, CIR continues to be recommended.     Follow Up Recommendations  CIR;Supervision/Assistance - 24 hour    Equipment Recommendations  Other (comment)    Recommendations for Other Services      Precautions / Restrictions Precautions Precautions: Fall Precaution Comments: ECMO, NGtube, Foley, Pt decannulated himself 2/4 Restrictions Other Position/Activity Restrictions: L platform walker used for mobility due to discomfort with grip to L hand       Mobility Bed Mobility Overal bed mobility: Needs Assistance Bed Mobility: Sit to Supine     Supine to sit: +2 for physical assistance;Min assist;Mod assist     General bed mobility comments: OOB in recliner  Transfers Overall  transfer level: Needs assistance (Simultaneous filing. User may not have seen previous data.) Equipment used: Left platform walker (Simultaneous filing. User may not have seen previous data.) Transfers: Sit to/from [employer] (Simultaneous filing. User may not have seen previous data.) Sit to Stand: +2 physical assistance;+2 safety/equipment;Min assist;Mod assist (Simultaneous filing. User may not have seen previous data.)         General transfer comment: min-modAx2 for 3x power up into standing from recliner, vc for scooting hips forward and placement of LE    Balance Overall balance assessment: Needs assistance Sitting-balance support: No upper extremity supported;Feet unsupported Sitting balance-Leahy Scale: Fair     Standing balance support: Bilateral upper extremity supported;During functional activity Standing balance-Leahy Scale: Poor Standing balance comment: requires outside support to maintain balance in standing                                                Cognition Arousal/Alertness: Awake/alert (Simultaneous filing. User may not have seen previous data.) Behavior During Therapy: Flat affect (Simultaneous filing. User may not have seen previous data.) Overall Cognitive Status: Within Functional Limits for tasks assessed (Simultaneous filing. User may not have seen previous data.)                                                            Pertinent Vitals/ Pain  Pain Assessment: No/denies pain (Simultaneous filing. User may not have seen previous data.)                                                          Frequency  Min 2X/week        Progress Toward Goals  OT Goals(current goals can now be found in the care plan section)  Progress towards OT goals: Progressing toward goals  Acute Rehab OT Goals Patient Stated Goal: continue to walk OT Goal Formulation: With  patient Time For Goal Achievement: 05/27/2020 Potential to Achieve Goals: Good ADL Goals Pt Will Perform Grooming: with set-up;sitting Pt Will Perform Upper Body Bathing: with min assist;sitting Pt/caregiver will Perform Home Exercise Program: With theraband;With theraputty;With Supervision;With written HEP provided Additional ADL Goal #1: Patient will be Min A of one, excluding ECMO support, with plaform walker for stand pivot transfers to [facility].  Plan Discharge plan remains appropriate    Co-evaluation    PT/OT/SLP Co-Evaluation/Treatment: Yes Reason for Co-Treatment: Complexity of the patient's impairments (multi-system involvement);For patient/therapist safety PT goals addressed during session: Mobility/safety with mobility OT goals addressed during session: ADL's and self-care      AM-PAC OT "6 Clicks" Daily Activity     Outcome Measure   Help from another person eating meals?: A Little Help from another person taking care of personal grooming?: A Little Help from another person toileting, which includes using toliet, bedpan, or urinal?: A Lot Help from another person bathing (including washing, rinsing, drying)?: A Lot Help from another person to put on and taking off regular upper body clothing?: A Lot Help from another person to put on and taking off regular lower body clothing?: A Lot 6 Click Score: 14    End of Session Equipment Utilized During Treatment: Oxygen;Gait belt;Rolling walker  OT Visit Diagnosis: Muscle weakness (generalized) (M62.81);Other symptoms and signs involving cognitive function;Other abnormalities of gait and mobility (R26.89)   Activity Tolerance Patient tolerated treatment well   Patient Left in chair;with call bell/phone within reach;with nursing/sitter in room   Nurse Communication          Time: 7209-4709 OT Time Calculation (min): 36 min  Charges: OT General Charges $OT Visit: 1 Visit OT Treatments $Therapeutic Activity: 8-22  mins  04/30/2020  [name], OTR/L  Acute Rehabilitation Services  Office:  [phone]    Suzanna Obey 04/30/2020, 1:20 PM

## 2020-04-30 NOTE — Procedures (Signed)
Extracorporeal support note  ECLS cannulation date: 02/22/2020 Last circuit change: 04/07/2020  Indication: Acute hypoxic respiratory failure due to ARDS from COVID-19 pneumonia with RV dysfunction.   Configuration: Venovenous  Drainage cannula: 32 French crescent cannula via right IJ Return cannula: Same  Pump speed: 3500 RPM Pump flow: 4.77 L/min Pump used: Cardio help  Oxygenator: Cardio help O2 blender: 100% Sweep gas: 5 L  Circuit check: minimal thrombin in circuit, no clots.  stable Anticoagulant: Bivalirudin Anticoagulation targets: PTT 60-80  Changes in support: continue aggressive mobility trials, sweep wean today, increase sleep meds  Anticipated goals/duration of support: Bridge to recovery   Multidisciplinary ECMO rounds completed.  Lorin Glass, MD 04/30/20 7:49 AM  Pulmonary & Critical Care

## 2020-04-30 NOTE — Progress Notes (Signed)
ANTICOAGULATION CONSULT NOTE  Pharmacy Consult for bivalirudin Indication: ECMO and VTE  Labs: Recent Labs    04/28/20 0418 04/28/20 0420 04/29/20 0403 04/29/20 0409 04/29/20 1607 04/29/20 1609 04/30/20 0420 04/30/20 0426 04/30/20 1121 04/30/20 1554 04/30/20 1611  HGB 8.7*   < > 8.7*   < > 8.5*   < > 8.1*   < > 9.5* 8.1* 8.5*  HCT 25.7*   < > 27.2*   < > 25.3*   < > 23.9*   < > 28.0* 26.3* 25.0*  PLT 176   < > 189  --  200  --  202  --   --  208  --   APTT 64*   < > 62*  --  66*  --  64*  --   --  65*  --   LABPROT 17.3*  --  17.1*  --   --   --  16.7*  --   --   --   --   INR 1.5*  --  1.4*  --   --   --  1.4*  --   --   --   --   CREATININE 0.59*   < > 0.67  --  0.63  --  0.73  --   --  0.68  --    < > = values in this interval not displayed.    Assessment: 34 yoM admitted with COVID-19 PNA with worsening hypoxia, s/p cannulation for ECMO. Pt was started on IV heparin prior to cannulation due to acute DVTs and possible PE, transitioned to bivalirudin with ECMO. Now s/p tPA on 1/5 and tracheostomy on 1/6. Circuit last changed 1/26.  aPTT continues to be therapeutic at 65 sec. No bleeding reported. LDH coming down 480 > 472 > 412. Hgb stable 8.5, plt wnl.  Goal of Therapy:  aPTT 60-80 seconds   Plan:  -Continue bivalirudin at 0.085 mg/kg/hr (using order-specific wt 72.1kg) -Monitor q12h aPTT/CBC, LDH, and for s/sx of bleeding   Leia Alf, PharmD, BCPS Please check AMION for all [facility] contact numbers Clinical Pharmacist 04/30/2020 5:56 PM

## 2020-04-30 NOTE — Progress Notes (Signed)
Nutrition Follow-up  DOCUMENTATION CODES:   Not applicable  INTERVENTION:   Ensure Enlive po TID, each supplement provides 350 kcal and 20 grams of protein  Tube Feeding via Cortrak: Nocturnal TF-reducing the rate and total time to 12 hours in attempt to improve pt's "feeling of fullness" to promote po intake Pivot 1.5 at 60 ml/hr x 12 hours Provies 1680 kcals, 105 g of protein, 851 ml of free water Meets <75% of estimated minimum needs   NUTRITION DIAGNOSIS:   Increased nutrient needs related to acute illness,catabolic illness (COVID-19 infection) as evidenced by estimated needs.  Being addressed via TF   GOAL:   Patient will meet greater than or equal to 90% of their needs  Progressing  MONITOR:   Vent status,TF tolerance,Labs,Weight trends  REASON FOR ASSESSMENT:   LOS Enteral/tube feeding initiation and management  ASSESSMENT:   50 y.o. male with no significant medical history. He presented to the ED with dyspnea, cough, and N/V. He reported that his symptoms began with cough 1 week ago. He tried OTC meds but nothing helped. Symptoms worsened to include body aches, fatigue, and N/V 3 days later. He went to his PCP 12/26 and got tested for COVID; he was positive.  12/26 COVID+  12/28 Admitted to [facility] 12/29 Intubated 12/30 Transferred to [facility], VV ECMO cannulation, L PTX with Chest tube insertion 12/31 Cortrak placed, Post-pyloric  1/02 Extubated to HFNC/BiPap as needed, ECHO with EF 60-65% 1/06 TEE for ECMO cannula position, Re-Intubated, Rhinorockets placed for epistaxis, Cortrak malpositioned-repositioned and now gastric per xray 1/07 Trach placed 1/14 Cortrak advanced to post pyloric position 1/16 L. Chest tube removed 1/22 CT chest: bilateral UL PE 1/23 Bronch showed semi occlusive ?mass/polyp in trachea 1/24 Bronch showed resolution of 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 1/26 ECMO Circuit exchanged 2/03 Trach exchange 2/05 Extubated to HFNC 2/07 TEE with no vegetation, EF  55-60% 2/14 Cortrak replaced, post-pyloric 2/15 Changed to nocturnal TF  2/16 Thoracentesis with 500 mL   Pt remains on VV ECMO, sweep 5 L Able to walk to door, walking in hall,  went outside yesterday   Diet advanced to Regular. Pt feels full in AM. Pt ate 1.5 meals yesterday and drank 2 Ensures. Recorded po intake from 2/16 80% at breakfast, 60% at lunch and 20% at dinner.  Today pt had already consumed 2 Ensures.   Pivot 1.5 at 80 ml/hr via Cortrak x 14 hours over night.  TF stops at 8 am each morning and currently meeting >75% of nutritional needs. Plan to change stop time of TF and reduce calorie provision to see if this promotes po intake, plan to take into consideration that fact that pt consumes average of 2 Ensures/day    Labs: reviewed, Cbgs 200s Meds: reglan, lasix, ss novolog, levemir, novolog , simethicone   Diet Order:   Diet Order            Diet regular Room service appropriate? Yes; Fluid consistency: Thin  Diet effective now                 EDUCATION NEEDS:   Not appropriate for education at this time  Skin:  Skin Assessment: Skin Integrity Issues: Skin Integrity Issues:: Stage II,Other (Comment) DTI: buttocks, forhead Stage II: anus, coccyx Other: ischemic changes to LUE, large bulla on L hand that ruptured  Last BM:  2/18 small type 4, rectal tube removed again  Height:   Ht Readings from Last 1 Encounters:  03/11/2020 5\' 4"  (1.626 m)  Weight:   Wt Readings from Last 1 Encounters:  04/30/20 74.5 kg    Ideal Body Weight:  59.1 kg  BMI:  Body mass index is 28.19 kg/m.  Estimated Nutritional Needs:   Kcal:  2160-2520 kcals  Protein:  130-150 g  Fluid:  >/= 2 L   Romelle Starcher MS, RDN, LDN, CNSC Registered Dietitian III Clinical Nutrition RD Pager and On-Call Pager Number Located in [place]

## 2020-04-30 NOTE — Progress Notes (Addendum)
CSW spoke with patients son [name] and answered all questions requested. No further questions reported at this time.  CSW will continue to follow.Marland Kitchen

## 2020-04-30 NOTE — Progress Notes (Signed)
Pt will not wear BIPAP tonight. Will put him on if needed.

## 2020-04-30 NOTE — Progress Notes (Signed)
Physical Therapy Treatment Patient Details Name: Alex [name] MRN: [id number] DOB: 1970-10-13 Today's Date: 04/30/2020    History of Present Illness Pt is 50 y.o. male  with no significant past medical history admitted on 2020-03-30 with dyspnea, cough, nausea/vomiting ~ 1 week ago with worsening symptoms of body aches and fatigue and +COVID 02/07/20 and admitted with shortness of breath. Required intubation 03/10/20. Cannulated for VV ECMO 02/11/2020. Oxygenating better with ECMO. Also evidence of RLE DVT, RV thrombus, and high suspicion of PE. Has required chest tube to L lung for collapse which needs further reposition with recurrent collapse. Extubated 03-15-19.  Reintubated 1/5 and trach placed 1/6. Decannulated 04/16/20.    PT Comments    As always pt agreeable to working with therapy. Pt and equipment moved to hallway, on first bout of standing worked to adjust new lower L platform walker and to work on pelvic rotation and standing hip flexor stretch. Pt easily fatigues and sits back down. ECMO Sweep increased to 6 and pt perks up. Pt able to perform 2 bouts of ambulation of about 30 feet. Quality of ambulation much improved today. Pt able to maintain wider BoS, and pelvic rotation, with only mod-minAx2 from therapists. Pt set up with resisted clamshell exercise to be performed 5x every hour as able throughout the weekend. PT will continue to progress mobility.     Follow Up Recommendations  CIR;Supervision/Assistance - 24 hour     Equipment Recommendations  Other (comment) (TBD (pt still on ECMO))    Recommendations for Other Services       Precautions / Restrictions Precautions Precautions: Fall Precaution Comments: ECMO, NGtube, Foley, Pt decannulated himself 2/4 Restrictions Weight Bearing Restrictions: No Other Position/Activity Restrictions: L platform walker used for mobility due to discomfort with grip to L hand    Mobility  Bed Mobility Overal bed mobility: Needs  Assistance Bed Mobility: Sit to Supine     Supine to sit: +2 for physical assistance;Min assist;Mod assist     General bed mobility comments: OOB in recliner    Transfers Overall transfer level: Needs assistance Equipment used: Left platform walker Transfers: Sit to/from Stand;Stand Pivot Transfers Sit to Stand: +2 physical assistance;+2 safety/equipment;Min assist;Mod assist         General transfer comment: min-modAx2 for 3x power up into standing from recliner, vc for scooting hips forward and placement of LE  Ambulation/Gait Ambulation/Gait assistance: Min assist Gait Distance (Feet): 60 Feet Assistive device: Left platform walker Gait Pattern/deviations: Step-to pattern;Step-through pattern;Decreased step length - right;Decreased step length - left;Wide base of support;Narrow base of support;Trunk flexed;Drifts right/left;Shuffle;Decreased weight shift to right;Decreased weight shift to left Gait velocity: slowed Gait velocity interpretation: <1.31 ft/sec, indicative of household ambulator General Gait Details: 2 bouts of ambulation today for about 30 feet each, improved hip rotation and BoS able to progress to step over step pattern without stopping continues to require multimodal cuing for upright posture and wide BoS       Balance Overall balance assessment: Needs assistance Sitting-balance support: No upper extremity supported;Feet unsupported Sitting balance-Leahy Scale: Fair     Standing balance support: Bilateral upper extremity supported;During functional activity Standing balance-Leahy Scale: Poor Standing balance comment: requires outside support to maintain balance in standing                            Cognition Arousal/Alertness: Awake/alert Behavior During Therapy: Flat affect Overall Cognitive Status: Within Functional Limits for tasks assessed  Exercises General Exercises - Lower  Extremity Hip ABduction/ADduction: Seated;Strengthening;AROM;5 reps (light orange resistance band)    General Comments General comments (skin integrity, edema, etc.): 10 L O2 via [place] and Sweep increased from 4 to 6 with distance. Pt's awareness better and recovery faster on increased Sweep      Pertinent Vitals/Pain Pain Assessment: No/denies pain           PT Goals (current goals can now be found in the care plan section) Acute Rehab PT Goals Patient Stated Goal: continue to walk PT Goal Formulation: With patient Time For Goal Achievement: 05/10/20 Potential to Achieve Goals: Fair Progress towards PT goals: Progressing toward goals    Frequency    Min 5X/week      PT Plan Current plan remains appropriate    Co-evaluation PT/OT/SLP Co-Evaluation/Treatment: Yes Reason for Co-Treatment: Complexity of the patient's impairments (multi-system involvement) PT goals addressed during session: Mobility/safety with mobility OT goals addressed during session: ADL's and self-care      AM-PAC PT "6 Clicks" Mobility   Outcome Measure  Help needed turning from your back to your side while in a flat bed without using bedrails?: A Lot Help needed moving from lying on your back to sitting on the side of a flat bed without using bedrails?: A Lot Help needed moving to and from a bed to a chair (including a wheelchair)?: A Lot Help needed standing up from a chair using your arms (e.g., wheelchair or bedside chair)?: A Lot Help needed to walk in hospital room?: A Lot Help needed climbing 3-5 steps with a railing? : Total 6 Click Score: 11    End of Session Equipment Utilized During Treatment: Gait belt Activity Tolerance: Patient tolerated treatment well Patient left: in chair;with call bell/phone within reach;with nursing/sitter in room Nurse Communication: Mobility status;Other (comment) (scheduled approx 11:30 for daily therapy) PT Visit Diagnosis: Muscle weakness (generalized)  (M62.81);Difficulty in walking, not elsewhere classified (R26.2);Other abnormalities of gait and mobility (R26.89);Unsteadiness on feet (R26.81)     Time: 4650-3546 PT Time Calculation (min) (ACUTE ONLY): 36 min  Charges:  $Gait Training: 8-22 mins                     Stephanie Mcglone B. Beverely Risen PT, DPT Acute Rehabilitation Services Pager [phone] Office [phone]    Elon Alas [facility] 04/30/2020, 3:38 PM

## 2020-04-30 NOTE — Progress Notes (Signed)
Patient ID: Alex [name], male   DOB: Mar 01, 1971, 50 y.o.   MRN: [id number]     Advanced Heart Failure Rounding Note  PCP-Cardiologist: No primary care provider on file.   Subjective:    - 12/30: VV ECMO cannulation - 12/31: Left chest tube replaced - 1/2: Extubated. Echo with EF 60-65%, mildly dilated RV with mildly decreased systolic function.  - 1/4: Agitated, suspected aspiration.  Re-intubated.  - 1/5: ECMO cannula repositioned under TEE guidance. TEE showed moderately dilated/moderate-severely dysfunctional RV in setting of hypoxemia. LUE DVT found.  Patient got 1/2 dose of TPA due to initial concern for large PE.  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  - 1/6: Tracheostomy - 1/7: Echo with mild RV dilation/mild RV dysfunction.  - 1/16: Left chest tube out - 1/17: LUE arterial dopplers repeated, showed no obstruction.  - 1/20: Echo with EF 65-70%, mildly D-shaped septum, mildly dilated and mildly dysfunctional RV.  - 1/22: CTA chest: Bilateral upper lobe PEs (suspect chronic), changes c/w ARDS - 1/23: Bronchoscopy showed semi-occlusive ?mass/polyp in the trachea.  - 1/24: Bronchoscopy showed resolution of 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 - 1/26: ECMO circuit changed.  - 2/1: CT chest showed diffuse bronchiectasis as well as diffuse opacity consistent with COVID-19 PNA with ARDS. - 2/3: Trach exchange - 2/5: Decannualted to HFNC - 2/6: Enterococcal PNA/bacteremia.  Echo showed EF 65-70%, normal-appearing RV, no vegetation noted.  - 2/7: TEE with no vegetation, EF 55-60%, RV low normal function with normal size.  - 2/16: Right thoracentesis 500 cc  Walked in hall again.  CXR slowly improving.  No complaints this morning.   Remains on vancomycin to 2/21.   ECMO parameters: 3500 rpm Flow 4.8 L/min Pvenous -91 Delta P 33 Sweep 5.0 HFNC 4 L  ABG 7.37/58/103/98% LDH  534 => 488 => 547 => 461 => => 507 => 475 => 501 => 478 => 449 => 495 => 480 => 472 => 412 PTT  64 Lactate 0.9 Hgb 8.1  Objective:   Weight Range: 74.5 kg Body mass index is 28.19 kg/m.   Vital Signs:   Temp:  [97.9 F (36.6 C)-99.5 F (37.5 C)] 99.5 F (37.5 C) (02/18 0000) Pulse Rate:  [87-120] 114 (02/18 0600) Resp:  [0-33] 0 (02/18 0600) BP: (100-172)/(55-94) 146/78 (02/18 0600) SpO2:  [93 %-100 %] 95 % (02/18 0600) Arterial Line BP: (114-174)/(51-102) 174/73 (02/18 0600) FiO2 (%):  [40 %] 40 % (02/17 2015) Weight:  [74.5 kg] 74.5 kg (02/18 0600) Last BM Date: 04/28/20  Weight change: Filed Weights   04/28/20 0640 04/29/20 0500 04/30/20 0600  Weight: 72.7 kg 71.9 kg 74.5 kg    Intake/Output:   Intake/Output Summary (Last 24 hours) at 04/30/2020 0737 Last data filed at 04/30/2020 0600 Gross per 24 hour  Intake 2383.45 ml  Output 2735 ml  Net -351.55 ml      Physical Exam    General: NAD Neck: No JVD, no thyromegaly or thyroid nodule.  Lungs: Clear to auscultation bilaterally with normal respiratory effort. CV: Nondisplaced PMI.  Heart regular S1/S2, no S3/S4, no murmur.  No peripheral edema.   Abdomen: Soft, nontender, no hepatosplenomegaly, no distention.  Skin: Intact without lesions or rashes.  Neurologic: Alert and oriented x 3.  Psych: Normal affect. Extremities: Digital gangrene left hand  HEENT: Normal.    Telemetry   NSR 100s. Personally reviewed   Labs    CBC Recent Labs    04/29/20 1607 04/29/20 1609 04/30/20 0420  04/30/20 0426  WBC 10.0  --  10.4  --   HGB 8.5*   < > 8.1* 8.2*  HCT 25.3*   < > 23.9* 24.0*  MCV 92.7  --  93.4  --   PLT 200  --  202  --    < > = values in this interval not displayed.   Basic Metabolic Panel Recent Labs    16/12/9600/16/22 0418 04/28/20 0420 04/29/20 1607 04/29/20 1609 04/30/20 0420 04/30/20 0426  NA 134*   < > 134*   < > 132* 134*  K 3.8   < > 3.8   < > 3.7 3.8  CL 99   < > 96*  --  94*  --   CO2 28   < > 30  --  29  --   GLUCOSE 133*   < > 135*  --  219*  --   BUN 37*   < > 28*  --   33*  --   CREATININE 0.59*   < > 0.63  --  0.73  --   CALCIUM 8.4*   < > 8.4*  --  8.2*  --   MG 2.1  --   --   --   --   --    < > = values in this interval not displayed.   Liver Function Tests No results for input(s): AST, ALT, ALKPHOS, BILITOT, PROT, ALBUMIN in the last 72 hours. No results for input(s): LIPASE, AMYLASE in the last 72 hours. Cardiac Enzymes No results for input(s): CKTOTAL, CKMB, CKMBINDEX, TROPONINI in the last 72 hours.  BNP: BNP (last 3 results) No results for input(s): BNP in the last 8760 hours.  ProBNP (last 3 results) No results for input(s): PROBNP in the last 8760 hours.   D-Dimer No results for input(s): DDIMER in the last 72 hours. Hemoglobin A1C No results for input(s): HGBA1C in the last 72 hours. Fasting Lipid Panel No results for input(s): CHOL, HDL, LDLCALC, TRIG, CHOLHDL, LDLDIRECT in the last 72 hours. Thyroid Function Tests No results for input(s): TSH, T4TOTAL, T3FREE, THYROIDAB in the last 72 hours.  Invalid input(s): FREET3  Other results:   Imaging    No results found.   Medications:     Scheduled Medications: . acetaZOLAMIDE  250 mg Per Tube BID  . bethanechol  10 mg Per Tube TID  . carvedilol  25 mg Per Tube BID WC  . chlorhexidine  15 mL Mouth Rinse BID  . Chlorhexidine Gluconate Cloth  6 each Topical Daily  . chlorpheniramine-HYDROcodone  5 mL Per Tube Q12H  . clonazePAM  1 mg Per Tube BID  . cloNIDine  0.1 mg Per Tube BID  . dextromethorphan  30 mg Per Tube TID  . docusate  100 mg Per Tube BID  . doxazosin  2 mg Per Tube Daily  . feeding supplement  237 mL Oral TID BM  . feeding supplement (PIVOT 1.5 CAL)  1,000 mL Per Tube Q24H  . fiber  1 packet Per Tube BID  . furosemide  40 mg Intravenous BID  . gabapentin  400 mg Per Tube Q8H  . insulin aspart  0-20 Units Subcutaneous Q4H  . insulin detemir  10 Units Subcutaneous Daily  . insulin detemir  40 Units Subcutaneous QHS  . ipratropium-albuterol  3 mL  Nebulization BID  . liver oil-zinc oxide   Topical 5 X Daily  . mouth rinse  15 mL Mouth Rinse q12n4p  .  melatonin  5 mg Per Tube QHS  . metoCLOPramide (REGLAN) injection  10 mg Intravenous Q8H  . oxyCODONE  15 mg Per Tube Q8H  . pantoprazole sodium  40 mg Per Tube QHS  . QUEtiapine  25 mg Per Tube QHS  . sennosides  10 mL Per Tube QHS  . sodium chloride flush  10-40 mL Intracatheter Q12H    Infusions: . sodium chloride    . sodium chloride Stopped (04/29/20 1611)  . albumin human Stopped (04/23/20 0041)  . bivalirudin (ANGIOMAX) infusion 0.5 mg/mL (Non-ACS indications) 0.085 mg/kg/hr (04/30/20 0600)  . vancomycin Stopped (04/29/20 2113)    PRN Medications: Place/Maintain arterial line **AND** sodium chloride, acetaminophen (TYLENOL) oral liquid 160 mg/5 mL, albumin human, [DISCONTINUED] lidocaine **AND** albuterol, Gerhardt's butt cream, guaiFENesin, hydrALAZINE, labetalol, lip balm, ondansetron (ZOFRAN) IV, oxyCODONE, phenol, polyethylene glycol, promethazine-codeine, simethicone, sodium chloride, sodium chloride flush   Assessment/Plan   1. Acute hypoxemic respiratory failure: Due to COVID-19 PNA with bilateral infiltrates.  Refractory hypoxemia, VV-ECMO cannulation on 02/28/2020 with improvement in oxygenation.  Developed left PTX post-subclavian CVL and had left chest tube, the left lung is re-expanded and CT out.  He was extubated 1/2 but reintubated 1/4 with agitation and suspected aspiration.  Tracheostomy 1/6.  CTA chest 1/22 with suspected chronic PEs and ARDS. ECMO cannula repositioned 1/5. ECMO circuit changed 1/26. Extubated to HFNC on 2/5. LDH stable. Sweep at 6.5, have struggled with hypercarbia from significant dead space ventilation and had a set back with recurrent sepsis but now improving.  Restarted abx 2/5 with sepsis, Enterococcus faecalis, now on vancomycin.  I/Os even yesterday with IV Lasix + acetazolamide.  - Continue slow sweep wean => decrease to 4 today (can  increase back with increased work of breathing).  - Continue acetazolamide 250 mg daily and Lasix 40 mg IV bid today.  - On vancomycin with Enterococcus.  ID has seen - Patient has had remdesivir, tocilizumab. - Completed steroid taper. - Continue bivalirudin, goal PTT 65-80.  PTT 64. Discussed dosing with PharmD personally. - Continue to mobilize => walking in hall now.  - Coreg continues at 25 mg bid to increase fractional flow via ECMO circuit.  2. 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  Clot noted on TEE in RV as well.  TTE 1/2 showed normal EF 60-65%, RV improved (mildly dilated/dysfunctional). TEE on 1/5 with moderate to severe RV dysfunction but patient was hypoxemic.  Had 1/2 dose TPA on 1/5. Echo 1/20 with mildly dilated/mildly dysfunctional RV. CTA chest 1/22 with chronic-appearing PEs in upper lobes. - PTT 64. Bivalirudin for goal PTT 65-80. Discussed dosing with PharmD personally. 3. Left PTX: Left chest tube, lung is re-expanded. Tube now out, stable CXR. 4. Shock: Suspect septic/distributive.  Now resolved, off NE.  5. Anemia: Hgb 8.1, transfuse < 7.5. Limit transfusion with transplant possibly on the horizon 6. AKI: Resolved 7. Hyperglycemia: insulin.  8. HTN: BP lower.     - Clonidine decreased to 0.1 bid and continue to wean down as able. No change today - Continue Coreg 25 mg bid.   - Suspect arterial line inaccurate, following cuff for now.  9. CHB: Episode of CHB when hypoxemic and with cough (suspect vagal).  NSR since then.   He has occasional short vagal episodes.  - Continue Coreg, watch rhythm.  10. Thrombocytopenia: Resolved  11. FEN: Continue tube feeds.  12. Ischemic digits: LUE.  Arterial dopplers 1/5 showed >50% left  brachial stenosis.  Repeat study 1/17 showed no obstruction.  - Wound Care following. 13. ID: Group F Strep and Enterococcus faecalis in sputum. Initially completed abx for these  bacteria. Now with recurrent sepsis, Enterococcus faecalis in blood now. Vancomycin restarted. TEE 2/7 with no vegetation.  - Continue vancomycin to 2/21.   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   - resolved 15. Hypernatremia: Resolved. Continue free water. No change 16. Remove foley today  CRITICAL CARE Performed by: Marca Ancona  Total critical care time: 35 minutes  Critical care time was exclusive of separately billable procedures and treating other patients.  Critical care was necessary to treat or prevent imminent or life-threatening deterioration.  Critical care was time spent personally by me on the following activities: development of treatment plan with patient and/or surrogate as well as nursing, discussions with consultants, evaluation of patient's response to treatment, examination of patient, obtaining history from patient or surrogate, ordering and performing treatments and interventions, ordering and review of laboratory studies, ordering and review of radiographic studies, pulse oximetry and re-evaluation of patient's condition.    Length of Stay: 69  Marca Ancona, MD  04/30/2020, 7:37 AM  Advanced Heart Failure Team Pager [phone] (M-F; 7a - 4p)  Please contact [facility] for night-coverage after hours (4p -7a ) and weekends on [email or web address]

## 2020-05-01 ENCOUNTER — Inpatient Hospital Stay (HOSPITAL_COMMUNITY): Payer: Medicaid Other

## 2020-05-01 LAB — POCT I-STAT 7, (LYTES, BLD GAS, ICA,H+H)
Acid-Base Excess: 9 mmol/L — ABNORMAL HIGH (ref 0.0–2.0)
Acid-Base Excess: 8 mmol/L — ABNORMAL HIGH (ref 0.0–2.0)
Acid-Base Excess: 4 mmol/L — ABNORMAL HIGH (ref 0.0–2.0)
Acid-Base Excess: 6 mmol/L — ABNORMAL HIGH (ref 0.0–2.0)
Acid-Base Excess: 8 mmol/L — ABNORMAL HIGH (ref 0.0–2.0)
Acid-Base Excess: 7 mmol/L — ABNORMAL HIGH (ref 0.0–2.0)
Acid-Base Excess: 7 mmol/L — ABNORMAL HIGH (ref 0.0–2.0)
Bicarbonate: 36 mmol/L — ABNORMAL HIGH (ref 20.0–28.0)
Bicarbonate: 36 mmol/L — ABNORMAL HIGH (ref 20.0–28.0)
Bicarbonate: 31 mmol/L — ABNORMAL HIGH (ref 20.0–28.0)
Bicarbonate: 31.8 mmol/L — ABNORMAL HIGH (ref 20.0–28.0)
Bicarbonate: 33.2 mmol/L — ABNORMAL HIGH (ref 20.0–28.0)
Bicarbonate: 34.5 mmol/L — ABNORMAL HIGH (ref 20.0–28.0)
Bicarbonate: 34.3 mmol/L — ABNORMAL HIGH (ref 20.0–28.0)
Calcium, Ion: 1.27 mmol/L (ref 1.15–1.40)
Calcium, Ion: 1.3 mmol/L (ref 1.15–1.40)
Calcium, Ion: 1.21 mmol/L (ref 1.15–1.40)
Calcium, Ion: 1.23 mmol/L (ref 1.15–1.40)
Calcium, Ion: 1.25 mmol/L (ref 1.15–1.40)
Calcium, Ion: 1.28 mmol/L (ref 1.15–1.40)
Calcium, Ion: 1.24 mmol/L (ref 1.15–1.40)
HCT: 24 % — ABNORMAL LOW (ref 39.0–52.0)
HCT: 26 % — ABNORMAL LOW (ref 39.0–52.0)
HCT: 23 % — ABNORMAL LOW (ref 39.0–52.0)
HCT: 25 % — ABNORMAL LOW (ref 39.0–52.0)
HCT: 26 % — ABNORMAL LOW (ref 39.0–52.0)
HCT: 27 % — ABNORMAL LOW (ref 39.0–52.0)
HCT: 29 % — ABNORMAL LOW (ref 39.0–52.0)
Hemoglobin: 8.2 g/dL — ABNORMAL LOW (ref 13.0–17.0)
Hemoglobin: 8.8 g/dL — ABNORMAL LOW (ref 13.0–17.0)
Hemoglobin: 7.8 g/dL — ABNORMAL LOW (ref 13.0–17.0)
Hemoglobin: 8.5 g/dL — ABNORMAL LOW (ref 13.0–17.0)
Hemoglobin: 8.8 g/dL — ABNORMAL LOW (ref 13.0–17.0)
Hemoglobin: 9.2 g/dL — ABNORMAL LOW (ref 13.0–17.0)
Hemoglobin: 9.9 g/dL — ABNORMAL LOW (ref 13.0–17.0)
O2 Saturation: 99 %
O2 Saturation: 91 %
O2 Saturation: 97 %
O2 Saturation: 92 %
O2 Saturation: 92 %
O2 Saturation: 97 %
O2 Saturation: 92 %
Patient temperature: 37.6
Patient temperature: 37.6
Patient temperature: 99.3
Patient temperature: 99.4
Patient temperature: 98
Potassium: 3.8 mmol/L (ref 3.5–5.1)
Potassium: 4 mmol/L (ref 3.5–5.1)
Potassium: 4.3 mmol/L (ref 3.5–5.1)
Potassium: 4.1 mmol/L (ref 3.5–5.1)
Potassium: 3.9 mmol/L (ref 3.5–5.1)
Potassium: 3.8 mmol/L (ref 3.5–5.1)
Potassium: 3.6 mmol/L (ref 3.5–5.1)
Sodium: 135 mmol/L (ref 135–145)
Sodium: 135 mmol/L (ref 135–145)
Sodium: 137 mmol/L (ref 135–145)
Sodium: 134 mmol/L — ABNORMAL LOW (ref 135–145)
Sodium: 136 mmol/L (ref 135–145)
Sodium: 134 mmol/L — ABNORMAL LOW (ref 135–145)
Sodium: 133 mmol/L — ABNORMAL LOW (ref 135–145)
TCO2: 38 mmol/L — ABNORMAL HIGH (ref 22–32)
TCO2: 38 mmol/L — ABNORMAL HIGH (ref 22–32)
TCO2: 33 mmol/L — ABNORMAL HIGH (ref 22–32)
TCO2: 33 mmol/L — ABNORMAL HIGH (ref 22–32)
TCO2: 35 mmol/L — ABNORMAL HIGH (ref 22–32)
TCO2: 36 mmol/L — ABNORMAL HIGH (ref 22–32)
TCO2: 36 mmol/L — ABNORMAL HIGH (ref 22–32)
pCO2 arterial: 70.3 mmHg (ref 32.0–48.0)
pCO2 arterial: 77.9 mmHg (ref 32.0–48.0)
pCO2 arterial: 62.7 mmHg — ABNORMAL HIGH (ref 32.0–48.0)
pCO2 arterial: 51.1 mmHg — ABNORMAL HIGH (ref 32.0–48.0)
pCO2 arterial: 48.7 mmHg — ABNORMAL HIGH (ref 32.0–48.0)
pCO2 arterial: 64.3 mmHg — ABNORMAL HIGH (ref 32.0–48.0)
pCO2 arterial: 63.8 mmHg — ABNORMAL HIGH (ref 32.0–48.0)
pH, Arterial: 7.32 — ABNORMAL LOW (ref 7.350–7.450)
pH, Arterial: 7.275 — ABNORMAL LOW (ref 7.350–7.450)
pH, Arterial: 7.302 — ABNORMAL LOW (ref 7.350–7.450)
pH, Arterial: 7.404 (ref 7.350–7.450)
pH, Arterial: 7.442 (ref 7.350–7.450)
pH, Arterial: 7.339 — ABNORMAL LOW (ref 7.350–7.450)
pH, Arterial: 7.337 — ABNORMAL LOW (ref 7.350–7.450)
pO2, Arterial: 135 mmHg — ABNORMAL HIGH (ref 83.0–108.0)
pO2, Arterial: 74 mmHg — ABNORMAL LOW (ref 83.0–108.0)
pO2, Arterial: 106 mmHg (ref 83.0–108.0)
pO2, Arterial: 67 mmHg — ABNORMAL LOW (ref 83.0–108.0)
pO2, Arterial: 62 mmHg — ABNORMAL LOW (ref 83.0–108.0)
pO2, Arterial: 108 mmHg (ref 83.0–108.0)
pO2, Arterial: 70 mmHg — ABNORMAL LOW (ref 83.0–108.0)

## 2020-05-01 LAB — BASIC METABOLIC PANEL
Anion gap: 8 (ref 5–15)
Anion gap: 7 (ref 5–15)
BUN: 28 mg/dL — ABNORMAL HIGH (ref 6–20)
BUN: 27 mg/dL — ABNORMAL HIGH (ref 6–20)
CO2: 33 mmol/L — ABNORMAL HIGH (ref 22–32)
CO2: 33 mmol/L — ABNORMAL HIGH (ref 22–32)
Calcium: 8.8 mg/dL — ABNORMAL LOW (ref 8.9–10.3)
Calcium: 8.8 mg/dL — ABNORMAL LOW (ref 8.9–10.3)
Chloride: 95 mmol/L — ABNORMAL LOW (ref 98–111)
Chloride: 94 mmol/L — ABNORMAL LOW (ref 98–111)
Creatinine, Ser: 0.68 mg/dL (ref 0.61–1.24)
Creatinine, Ser: 0.64 mg/dL (ref 0.61–1.24)
GFR, Estimated: >60 mL/min (ref >60)
GFR, Estimated: >60 mL/min (ref >60)
Glucose, Bld: 148 mg/dL — ABNORMAL HIGH (ref 70–99)
Glucose, Bld: 149 mg/dL — ABNORMAL HIGH (ref 70–99)
Potassium: 3.8 mmol/L (ref 3.5–5.1)
Potassium: 3.8 mmol/L (ref 3.5–5.1)
Sodium: 136 mmol/L (ref 135–145)
Sodium: 134 mmol/L — ABNORMAL LOW (ref 135–145)

## 2020-05-01 LAB — PREPARE RBC (CROSSMATCH)

## 2020-05-01 LAB — URINALYSIS, ROUTINE W REFLEX MICROSCOPIC
Bilirubin Urine: NEGATIVE
Glucose, UA: NEGATIVE mg/dL
Hgb urine dipstick: NEGATIVE
Ketones, ur: NEGATIVE mg/dL
Leukocytes,Ua: NEGATIVE
Nitrite: NEGATIVE
Protein, ur: NEGATIVE mg/dL
Specific Gravity, Urine: 1.017 (ref 1.005–1.030)
pH: 7 (ref 5.0–8.0)

## 2020-05-01 LAB — CBC
HCT: 25.5 % — ABNORMAL LOW (ref 39.0–52.0)
HCT: 25.9 % — ABNORMAL LOW (ref 39.0–52.0)
HCT: 27.6 % — ABNORMAL LOW (ref 39.0–52.0)
Hemoglobin: 8 g/dL — ABNORMAL LOW (ref 13.0–17.0)
Hemoglobin: 8.5 g/dL — ABNORMAL LOW (ref 13.0–17.0)
Hemoglobin: 9.4 g/dL — ABNORMAL LOW (ref 13.0–17.0)
MCH: 30 pg (ref 26.0–34.0)
MCH: 30.4 pg (ref 26.0–34.0)
MCH: 31.4 pg (ref 26.0–34.0)
MCHC: 31.4 g/dL (ref 30.0–36.0)
MCHC: 32.8 g/dL (ref 30.0–36.0)
MCHC: 34.1 g/dL (ref 30.0–36.0)
MCV: 95.5 fL (ref 80.0–100.0)
MCV: 92.5 fL (ref 80.0–100.0)
MCV: 92.3 fL (ref 80.0–100.0)
Platelets: 216 10*3/uL (ref 150–400)
Platelets: 203 10*3/uL (ref 150–400)
Platelets: 233 10*3/uL (ref 150–400)
RBC: 2.67 MIL/uL — ABNORMAL LOW (ref 4.22–5.81)
RBC: 2.8 MIL/uL — ABNORMAL LOW (ref 4.22–5.81)
RBC: 2.99 MIL/uL — ABNORMAL LOW (ref 4.22–5.81)
RDW: 17 % — ABNORMAL HIGH (ref 11.5–15.5)
RDW: 16.5 % — ABNORMAL HIGH (ref 11.5–15.5)
RDW: 17.1 % — ABNORMAL HIGH (ref 11.5–15.5)
WBC: 10.7 10*3/uL — ABNORMAL HIGH (ref 4.0–10.5)
WBC: 9.9 10*3/uL (ref 4.0–10.5)
WBC: 10.3 10*3/uL (ref 4.0–10.5)
nRBC: 0.3 % — ABNORMAL HIGH (ref 0.0–0.2)
nRBC: 0.2 % (ref 0.0–0.2)
nRBC: 0 % (ref 0.0–0.2)

## 2020-05-01 LAB — GLUCOSE, CAPILLARY
Glucose-Capillary: 140 mg/dL — ABNORMAL HIGH (ref 70–99)
Glucose-Capillary: 140 mg/dL — ABNORMAL HIGH (ref 70–99)
Glucose-Capillary: 146 mg/dL — ABNORMAL HIGH (ref 70–99)
Glucose-Capillary: 175 mg/dL — ABNORMAL HIGH (ref 70–99)
Glucose-Capillary: 175 mg/dL — ABNORMAL HIGH (ref 70–99)

## 2020-05-01 LAB — APTT
aPTT: 60 seconds — ABNORMAL HIGH (ref 24–36)
aPTT: 48 seconds — ABNORMAL HIGH (ref 24–36)
aPTT: 72 seconds — ABNORMAL HIGH (ref 24–36)

## 2020-05-01 LAB — LACTIC ACID, PLASMA
Lactic Acid, Venous: 0.6 mmol/L (ref 0.5–1.9)
Lactic Acid, Venous: 0.8 mmol/L (ref 0.5–1.9)

## 2020-05-01 LAB — PROTIME-INR
INR: 1.4 — ABNORMAL HIGH (ref 0.8–1.2)
Prothrombin Time: 16.7 seconds — ABNORMAL HIGH (ref 11.4–15.2)

## 2020-05-01 LAB — LACTATE DEHYDROGENASE: LDH: 436 U/L — ABNORMAL HIGH (ref 98–192)

## 2020-05-01 LAB — FIBRINOGEN: Fibrinogen: 349 mg/dL (ref 210–475)

## 2020-05-01 MED ORDER — POTASSIUM CHLORIDE 20 MEQ PO PACK
40.0000 meq | PACK | Freq: Once | ORAL | Status: AC
  Administered 2020-05-01: 40 meq
  Filled 2020-05-01: qty 2

## 2020-05-01 MED ORDER — SODIUM CHLORIDE 0.9% IV SOLUTION: Freq: Once | INTRAVENOUS | Status: AC

## 2020-05-01 MED ORDER — QUETIAPINE FUMARATE 50 MG PO TABS
50.0000 mg | ORAL_TABLET | Freq: Every day | ORAL | Status: DC
  Administered 2020-05-01: 50 mg
  Filled 2020-05-01: qty 1

## 2020-05-01 NOTE — Progress Notes (Signed)
Pharmacy Antibiotic Note  Alex [name] is a 50 y.o. male on ECMO. Pharmacy consulted to dose vancomycin for treatment of enterococcal bacteremia. TEE showed no vegetation. Treating nosocomial enterococcus faecalis bacteremia. 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 Vancomycin tentatively planned through 2/21.   Vancomycin trough last checked on 2/16 therapeutic at 19 mcg/ml. Cr has remained stable ~0.6. Repeat cultures 2/11 are no growth.  Plan: -Will continue vancomycin 1000mg  IV q12h -Follow SCr daily   Height: 5\' 4"  (162.6 cm) Weight: 75 kg (165 lb 5.5 oz) IBW/kg (Calculated) : 59.2  Temp (24hrs), Avg:98.4 F (36.9 C), Min:97.7 F (36.5 C), Max:99.1 F (37.3 C)  Recent Labs  Lab 04/28/20 0715 04/28/20 1652 04/29/20 0403 04/29/20 1607 04/30/20 0420 04/30/20 1554 05/01/20 0406  WBC  --    < > 11.3* 10.0 10.4 9.6 10.7*  CREATININE  --    < > 0.67 0.63 0.73 0.68 0.68  LATICACIDVEN  --    < > 0.7 0.9 0.9 0.6 0.6  VANCOTROUGH 19  --   --   --   --   --   --    < > = values in this interval not displayed.    Estimated Creatinine Clearance: 105.8 mL/min (by C-G formula based on SCr of 0.68 mg/dL).    No Known Allergies   05/02/20, PharmD, BCPS PGY2 Cardiology Pharmacy Resident Phone: [phone] 05/01/2020  7:41 AM  Please check [email or web address] for unit-specific pharmacy phone numbers.

## 2020-05-01 NOTE — Progress Notes (Signed)
ECMO PROGRESS NOTE  NAME:  Alex [name], MRN:  [id number], DOB:  May 28, 1970, LOS: 53 ADMISSION DATE:  03/08/2020, CONSULTATION DATE: 03/04/2020 REFERRING MD: Wynona Neat -LBPCCM, CHIEF COMPLAINT: Respiratory failure requiring ECMO  HPI/course in hospital  50 year old man admitted to hospital 12/28 with 1 week history of dyspnea cough nausea and vomiting.  Initially admitted to [facility] long hospital and placed on high flow nasal cannula but rapidly failed and required intubation 12/29.  Persistent hypoxic respiratory failure with PF ratio 55 in spite of 18 of PEEP FiO2 0.1 despite paralytics.  Did not improve with prone ventilation  Cannulated for VV ECMO 12/30 via right IJ crescent cannula.  ECMO circuit was changed on 04/07/2020 Iatrogenic pneumothorax from left subclavian triple-lumen placement  12/28 admitted covid, ARDS 12/30 ECMO cannulation, iatrogenic pneumothrorax, DVT, Bivalrudin started           Left subclavian hematoma, chest tube, ceftriaxone/ azithromycin started 12/31 1Unit PRBC 1/1 Palliative consult, noted HTN, fentanyl only 1/2 Extubation I/O positive, left lung re-expanded, EF 60-65%, Lasix 20mg  BID 1/3 BiPAP in place, HTN to 190s, 200s, labetolol for BP, I/O even, cefepime and vancomycin started 1/4 agitation off BiPAP, possible aspiration 1/5 agitation overnight, reintubated      ECHO with RV dilation, ECMO cannula repositioned, LUE DVT (+), 1/2 dose tPA given,      I/O up, on lasix, Epi, NE, vasopressin started, HR/ rhythm issues noted 1/6 Tracheostomy, hypoglycemic when off tube feeds       Cortrack tune issues 1/7 on lasix gtt 4mg /hr with diuresis, agitation improved 1/8 starting steroid taper 1/9 severe agitation, heavy sedation required, lasix gtt 6mg /hr, I/Os (-)       precedex and fentanyl 1/10 Vanc stopped 1/11 sweep to 2, lasix gtt at 4mg /hr, weight up, metoprolol added for HTN 1/12 fevers to 99 noted, lasix gtt and metazolone 1/13 lasix gtt dced 1/14  intermittent HTN, possibly flash pulmonary edema tied to sedation        Ischemic left hand changes, 1 Unit PRBCs 1/15 1/16 sweep from 4 to 2.5, chest tube removed 1/17 sweep at 6, trial of nebulized morphine for cough, LUE dopplers show no obstruction 1/18 two events of 2nd degree type II HB noted with coughing         sweep at 4, IV lasix 40, acetazolamide, distal XLT trach placement         agitation and BP spikes 1/19 agitation, air hunger, seroquel, klonopin, oxy, valproate, ketamine trial 1/20 sweep at 3.5, diuresis results in chugging, albumin given         1 Unit PRBCs, lidocaine nebulizers for cough, ancef started 1/21 sweep to 8, anxiety, I/Os (+) with lasix and acetazolamide 1/22 sweep at 5, chest CT bilateral upper lobe PEs 1/23 sweep at 5, delirium, I/Os even with lasix and acetazolamide         continued coughing, 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 1/24 sweep at 7.5, 1 Unit PRBCs, repeat bronchoscopy showed resolution of 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 1/25 sweep at 7.5, on propofol, lasix gtt at 27ml/hr, I/Os (+)         nebulized morphine and lidocaine for cough 1/26 sweep at 8, propofol/ precedex, cough present when weaned, lasix gtt at 30ml/hr        ECMO circuit changes, levophed started, 1 Unit PRBCs 1/27 sweep at 4, off sedation, delirium, I/Os even        lasix gtt restated, acetazolamide overnight 1/28 sweep at 7.5, HTN  labile 1/29 sweep of 6, patient reports a tickle in throat, cetacaine 1/30 sweep down to 6, cough improved with cetacaine and gabapentin 1/31 sweep at 5, agitation, off acetazolamide, lasix, weight up 2/1 cough, of pulmicort, cetacaine for cough, continued gabapentin, dexmethorphan       CT demonstrated trach placement against back of trachea 2/2 Trach collar trial somewhat effective in managing cough       low dose diltiazem for HR,  2/3 sweep at 4,trach cannula replacement (longer, more flexible bivona); brochoscopy shows  irritated mucosa       IV lasix and acetazolamide 2/4 sweep at 5, I/Os (+), BUN elevated, lasix gtt with diamox,       trach decannulation, increased gabapentin for cough 2/5 start HCAP coverage for rising WBC/temps, check Pct/cultures, CXR worse with lack of PEEP, diuresed well but started getting very hypotensive, sepsis vs. Too dry 04/18/20 blood cx grew E faecalis 2/7 PICC change, TEE 2/7>> sweep weans  Past Medical History  none  Interim history/subjective:  Slept better. On 2.5L sweep but acidotic. He's a bit depressed due to financial issues.  Objective   Blood pressure 118/85, pulse (!) 102, temperature 97.7 F (36.5 C), temperature source Axillary, resp. rate 19, height 5\' 4"  (1.626 m), weight 75 kg, SpO2 100 %.        Intake/Output Summary (Last 24 hours) at 05/01/2020 0748 Last data filed at 05/01/2020 0600 Gross per 24 hour  Intake 2342.19 ml  Output 2760 ml  Net -417.81 ml   Filed Weights   04/29/20 0500 04/30/20 0600 05/01/20 0500  Weight: 71.9 kg 74.5 kg 75 kg    Examination: Constitutional: no acute distress sitting in chair  Eyes: EOMI, pupils equal Ears, nose, mouth, and throat: Trach site dressed without strikethrough Cardiovascular: tachy, ext warm Respiratory: Diminished at bases, no accessory muscle use Gastrointestinal: soft, +BS Skin: No rashes, normal turgor Neurologic: moves all 4 ext to command Psychiatric: RASS 0   Labs all stable with exception of Hgb drifting down now 8 More or less even fluid-wise Stable diffuse airspace disease CBG look good!  Assessment & Plan:  Acute hypoxemic/hypercapnic respiratory failure due to severe ARDS from COVID-19 pneumonia Probable acute PE, and RV dysfunction s/p TPA Refractory coughing- improved after trach removal but still an issue Strep group F/ enterococcal pneumonia- s/p 10 days vanc 1/29; recurrent with bacteremia on 2/5. R effusion- lymphocyte predominant transudate drained 2/16 - Continue VV  ECMO, sweep weans titrated to WOB/ABG - Cough regimen as ordered - Continue supplemental oxygen via nasal cannula - IS, flutter, OOB mobility to prevent muscle atrophy - Keep even with diuresis: lasix and acetazolamide BID, doing well with this - Today: increase sweep slightly, RN team thinks he may need a rest day  HTN.  Trials of reducing HR/BP have resulted in reduced pCO2 (likely related to fractional flow through ECMO circuit) -Continue coreg, taper clonidine (to end 2/20), continue dilt; goal to keep lower MAPs and HR to increase fractional circuit flow  Acute delirium, with agitation- improved. -RASS goal -1 to 0, PRN, morphine PRN, oxycodone 15mg > now q8h, qHS seroquel, klonipin BID 1mg  -Plans to slowly wean this over next couple weeks: klonipin lowered 2/16 -Seroquel 25 (2/17)>>75 (2/18) >> 50 (2/19)  Sepsis secondary to E faecalis bacteremia- secondary to PNA, has grown enterococcus in sputum prior, previous vanc course completed 1/29. TEE on 2/7 without vegetations. PICC changed on 2/7. -Continue vanc through 2/21 per ID  Gastroparesis with some element ileus:  improved but occasional vomiting spells with coughing attacks.  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  - wound care following, appreciate dressing recommendations - keep A-line out of left arm   R arm swelling- duplex neg, NTD here, going to space out cuff measurements since we have arterial line  Hyperglycemia due to diabetes type II -Basal bolus insulin adjusted, goal CBG 100-180  Acute urinary retention; no evidence of UTI based on UA Hematuria -failed condom cath trial, foley replaced 2/7; again on 2/11 -increase mobility -continue cardura -on voiding trial, check intermittent bladder scans PRN  Deconditioning -con't working with PT, OT, SLP, appreciate  help   Daily Goals Checklist  Pain/Anxiety/Delirium protocol (if indicated): see above VAP protocol (if indicated):  n/a DVT prophylaxis: bivalirudin GI prophylaxis: pantoprazole Glucose control: basal bolus insulin Mobility/therapy needs: Mobilization as tolerated Code Status: Full code Disposition: ICU  Patient critically ill due to ARDS Interventions to address this today ECMO titration Risk of deterioration without these interventions is high  I personally spent 35 minutes providing critical care not including any separately billable procedures  4/11 MD [facility] 04/12/2020 7:23 AM Prefer epic messenger for cross cover needs If after hours, please call E-link

## 2020-05-01 NOTE — Plan of Care (Signed)
  Problem: Health Behavior/Discharge Planning: Goal: Ability to manage health-related needs will improve Outcome: Progressing   Problem: Clinical Measurements: Goal: Ability to maintain clinical measurements within normal limits will improve Outcome: Progressing Goal: Will remain free from infection Outcome: Progressing Goal: Diagnostic test results will improve Outcome: Progressing Goal: Respiratory complications will improve Outcome: Progressing Goal: Cardiovascular complication will be avoided Outcome: Progressing   Problem: Activity: Goal: Risk for activity intolerance will decrease Outcome: Progressing   Problem: Coping: Goal: Level of anxiety will decrease Outcome: Progressing   Problem: Pain Managment: Goal: General experience of comfort will improve Outcome: Progressing   Problem: Elimination: Goal: Will not experience complications related to bowel motility Outcome: Progressing Goal: Will not experience complications related to urinary retention Outcome: Progressing   Problem: Safety: Goal: Ability to remain free from injury will improve Outcome: Progressing   Problem: Skin Integrity: Goal: Risk for impaired skin integrity will decrease Outcome: Progressing   Problem: Education: Goal: Knowledge of risk factors and measures for prevention of condition will improve Outcome: Progressing   Problem: Coping: Goal: Psychosocial and spiritual needs will be supported Outcome: Progressing   Problem: Respiratory: Goal: Will maintain a patent airway Outcome: Progressing Goal: Complications related to the disease process, condition or treatment will be avoided or minimized Outcome: Progressing   

## 2020-05-01 NOTE — Procedures (Signed)
Extracorporeal support note  ECLS cannulation date: 03/12/2020 Last circuit change: 04/07/2020  Indication: Acute hypoxic respiratory failure due to ARDS from COVID-19 pneumonia with RV dysfunction.   Configuration: Venovenous  Drainage cannula: 32 French crescent cannula via right IJ Return cannula: Same  Pump speed: 3500 RPM Pump flow: 4.88 L/min Pump used: Cardio help  Oxygenator: Cardio help O2 blender: 100% Sweep gas: 2.5 L  Circuit check: minimal thrombin in circuit, no clots.  stable Anticoagulant: Bivalirudin Anticoagulation targets: PTT 60-80  Changes in support: to have less aggressive mobility session today, sweep to 3.5, keep even fluid-wise, adjusting sedating meds  Anticipated goals/duration of support: Bridge to recovery   Multidisciplinary ECMO rounds completed.  Lorin Glass, MD 05/01/20 7:58 AM [facility]

## 2020-05-01 NOTE — Progress Notes (Addendum)
ANTICOAGULATION CONSULT NOTE  Pharmacy Consult for bivalirudin Indication: ECMO and VTE  Labs: Recent Labs    04/29/20 0403 04/29/20 0409 04/30/20 0420 04/30/20 0426 04/30/20 1554 04/30/20 1611 05/01/20 0406 05/01/20 0415 05/01/20 0640  HGB 8.7*   < > 8.1*   < > 8.1*   < > 8.0* 8.2* 8.8*  HCT 27.2*   < > 23.9*   < > 26.3*   < > 25.5* 24.0* 26.0*  PLT 189   < > 202  --  208  --  216  --   --   APTT 62*   < > 64*  --  65*  --  60*  --   --   LABPROT 17.1*  --  16.7*  --   --   --  16.7*  --   --   INR 1.4*  --  1.4*  --   --   --  1.4*  --   --   CREATININE 0.67   < > 0.73  --  0.68  --  0.68  --   --    < > = values in this interval not displayed.    Assessment: 25 yoM admitted with COVID-19 PNA with worsening hypoxia, s/p cannulation for ECMO. Pt was started on IV heparin prior to cannulation due to acute DVTs and possible PE, transitioned to bivalirudin with ECMO. Now s/p tPA on 1/5 and tracheostomy on 1/6. Circuit last changed 1/26.  aPTT continues to be therapeutic at 60 sec. No bleeding reported. LDH up slightly 480 > 472 > 412 > 436. Hgb down slightly 8.0, plt wnl. Giving 1u PRBC today.  Goal of Therapy:  aPTT 60-80 seconds   Plan:  -Continue bivalirudin at 0.085 mg/kg/hr (using order-specific wt 72.1kg) -Monitor q12h aPTT/CBC, LDH, and for s/sx of bleeding  Tama Headings, PharmD, BCPS PGY2 Cardiology Pharmacy Resident Phone: [phone] 05/01/2020  7:23 AM  Please check [email or web address] for unit-specific pharmacy phone numbers.

## 2020-05-01 NOTE — Progress Notes (Signed)
ANTICOAGULATION CONSULT NOTE  Pharmacy Consult for bivalirudin Indication: ECMO and VTE  Labs: Recent Labs    04/29/20 0403 04/29/20 0409 04/30/20 0420 04/30/20 0426 04/30/20 1554 04/30/20 1611 05/01/20 0406 05/01/20 0415 05/01/20 1324 05/01/20 1332 05/01/20 1549 05/01/20 1605 05/01/20 1717  HGB 8.7*   < > 8.1*   < > 8.1*   < > 8.0*   < > 8.5* 8.8* 9.4* 9.2*  --   HCT 27.2*   < > 23.9*   < > 26.3*   < > 25.5*   < > 25.9* 26.0* 27.6* 27.0*  --   PLT 189   < > 202  --  208  --  216  --  203  --  233  --   --   APTT 62*   < > 64*  --  65*  --  60*  --   --   --  48*  --  72*  LABPROT 17.1*  --  16.7*  --   --   --  16.7*  --   --   --   --   --   --   INR 1.4*  --  1.4*  --   --   --  1.4*  --   --   --   --   --   --   CREATININE 0.67   < > 0.73  --  0.68  --  0.68  --   --   --  0.64  --   --    < > = values in this interval not displayed.    Assessment: 55 yoM admitted with COVID-19 PNA with worsening hypoxia, s/p cannulation for ECMO. Pt was started on IV heparin prior to cannulation due to acute DVTs and possible PE, transitioned to bivalirudin with ECMO. Now s/p tPA on 1/5 and tracheostomy on 1/6. Circuit last changed 1/26.  aPTT continues to be therapeutic at 60 sec. No bleeding reported. LDH up slightly 480 > 472 > 412 > 436. Hgb down slightly 8.0, plt wnl. Giving 1u PRBC today.  PM f/u > aPTT = 48 this evening, but was collected by phlebotomy while collecting blood cultures, seemed inconsistent with previous levels, so rechecked from A-line = 72.  Goal of Therapy:  aPTT 60-80 seconds   Plan:  -Continue bivalirudin at 0.085 mg/kg/hr (using order-specific wt 72.1kg) -Monitor q12h aPTT/CBC, LDH, and for s/sx of bleeding  Tad Moore, BCPS, BCCP Clinical Pharmacist  05/01/2020 7:00 PM   [facility] pharmacy phone numbers are listed on [email or web address]

## 2020-05-01 NOTE — Progress Notes (Signed)
Patient ID: Alex [name], male   DOB: Nov 17, 1970, 50 y.o.   MRN: [id number]     Advanced Heart Failure Rounding Note  PCP-Cardiologist: No primary care provider on file.   Subjective:    - 12/30: VV ECMO cannulation - 12/31: Left chest tube replaced - 1/2: Extubated. Echo with EF 60-65%, mildly dilated RV with mildly decreased systolic function.  - 1/4: Agitated, suspected aspiration.  Re-intubated.  - 1/5: ECMO cannula repositioned under TEE guidance. TEE showed moderately dilated/moderate-severely dysfunctional RV in setting of hypoxemia. LUE DVT found.  Patient got 1/2 dose of TPA due to initial concern for large PE.  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  - 1/6: Tracheostomy - 1/7: Echo with mild RV dilation/mild RV dysfunction.  - 1/16: Left chest tube out - 1/17: LUE arterial dopplers repeated, showed no obstruction.  - 1/20: Echo with EF 65-70%, mildly D-shaped septum, mildly dilated and mildly dysfunctional RV.  - 1/22: CTA chest: Bilateral upper lobe PEs (suspect chronic), changes c/w ARDS - 1/23: Bronchoscopy showed semi-occlusive ?mass/polyp in the trachea.  - 1/24: Bronchoscopy showed resolution of 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 - 1/26: ECMO circuit changed.  - 2/1: CT chest showed diffuse bronchiectasis as well as diffuse opacity consistent with COVID-19 PNA with ARDS. - 2/3: Trach exchange - 2/5: Decannualted to HFNC - 2/6: Enterococcal PNA/bacteremia.  Echo showed EF 65-70%, normal-appearing RV, no vegetation noted.  - 2/7: TEE with no vegetation, EF 55-60%, RV low normal function with normal size.  - 2/16: Right thoracentesis 500 cc  Worked aggressively with PT again yesterday.  Tired this morning and more depressed.  Sweep down to 2, ABG looks acidotic. Up to 12 L HFNC. CXR stable.  I/Os even on IV Lasix.   Remains on vancomycin to 2/21.   ECMO parameters: 3500 rpm Flow 4.8 L/min Pvenous -79 Delta P 39 Sweep 2.0 HFNC 12 L  ABG  7.28/78/74/91% LDH  436 PTT 60 Lactate 0.6 Hgb 8.0  Objective:   Weight Range: 75 kg Body mass index is 28.38 kg/m.   Vital Signs:   Temp:  [97.7 F (36.5 C)-99.1 F (37.3 C)] 97.7 F (36.5 C) (02/18 2000) Pulse Rate:  [91-118] 102 (02/19 0700) Resp:  [1-36] 19 (02/19 0700) BP: (103-144)/(36-85) 118/85 (02/19 0700) SpO2:  [96 %-100 %] 100 % (02/19 0700) Arterial Line BP: (109-216)/(47-86) 150/63 (02/19 0700) Weight:  [75 kg] 75 kg (02/19 0500) Last BM Date: 04/29/20  Weight change: Filed Weights   04/29/20 0500 04/30/20 0600 05/01/20 0500  Weight: 71.9 kg 74.5 kg 75 kg    Intake/Output:   Intake/Output Summary (Last 24 hours) at 05/01/2020 0733 Last data filed at 05/01/2020 0600 Gross per 24 hour  Intake 2342.19 ml  Output 2760 ml  Net -417.81 ml      Physical Exam    General: NAD Neck: Thick, no JVD, no thyromegaly or thyroid nodule.  Lungs: Clear to auscultation bilaterally with normal respiratory effort. CV: Nondisplaced PMI.  Heart regular S1/S2, no S3/S4, no murmur.  No peripheral edema.   Abdomen: Soft, nontender, no hepatosplenomegaly, no distention.  Skin: Intact without lesions or rashes.  Neurologic: Alert and oriented x 3.  Psych: Depressed affect. Extremities: Digital gangrene left hand.  HEENT: Normal.    Telemetry   NSR 100s. Personally reviewed   Labs    CBC Recent Labs    04/30/20 1554 04/30/20 1611 05/01/20 0406 05/01/20 0415 05/01/20 0640  WBC 9.6  --  10.7*  --   --  HGB 8.1*   < > 8.0* 8.2* 8.8*  HCT 26.3*   < > 25.5* 24.0* 26.0*  MCV 95.3  --  95.5  --   --   PLT 208  --  216  --   --    < > = values in this interval not displayed.   Basic Metabolic Panel Recent Labs    85/88/50 1554 04/30/20 1611 05/01/20 0406 05/01/20 0415 05/01/20 0640  NA 134*   < > 136 135 135  K 4.2   < > 3.8 3.8 4.0  CL 95*  --  95*  --   --   CO2 31  --  33*  --   --   GLUCOSE 127*  --  148*  --   --   BUN 29*  --  28*  --   --    CREATININE 0.68  --  0.68  --   --   CALCIUM 8.7*  --  8.8*  --   --    < > = values in this interval not displayed.   Liver Function Tests No results for input(s): AST, ALT, ALKPHOS, BILITOT, PROT, ALBUMIN in the last 72 hours. No results for input(s): LIPASE, AMYLASE in the last 72 hours. Cardiac Enzymes No results for input(s): CKTOTAL, CKMB, CKMBINDEX, TROPONINI in the last 72 hours.  BNP: BNP (last 3 results) No results for input(s): BNP in the last 8760 hours.  ProBNP (last 3 results) No results for input(s): PROBNP in the last 8760 hours.   D-Dimer No results for input(s): DDIMER in the last 72 hours. Hemoglobin A1C No results for input(s): HGBA1C in the last 72 hours. Fasting Lipid Panel No results for input(s): CHOL, HDL, LDLCALC, TRIG, CHOLHDL, LDLDIRECT in the last 72 hours. Thyroid Function Tests No results for input(s): TSH, T4TOTAL, T3FREE, THYROIDAB in the last 72 hours.  Invalid input(s): FREET3  Other results:   Imaging    No results found.   Medications:     Scheduled Medications: . acetaZOLAMIDE  250 mg Per Tube BID  . bethanechol  10 mg Per Tube TID  . carvedilol  25 mg Per Tube BID WC  . chlorhexidine  15 mL Mouth Rinse BID  . Chlorhexidine Gluconate Cloth  6 each Topical Daily  . chlorpheniramine-HYDROcodone  5 mL Per Tube Q12H  . clonazePAM  1 mg Per Tube BID  . cloNIDine  0.1 mg Per Tube Daily  . dextromethorphan  30 mg Per Tube TID  . diltiazem  30 mg Oral Q12H  . docusate  100 mg Per Tube BID  . doxazosin  2 mg Per Tube Daily  . feeding supplement  237 mL Oral TID BM  . feeding supplement (PIVOT 1.5 CAL)  1,000 mL Per Tube Q24H  . fiber  1 packet Per Tube BID  . furosemide  40 mg Intravenous BID  . gabapentin  300 mg Per Tube Q8H  . insulin aspart  0-20 Units Subcutaneous Q4H  . insulin aspart  4 Units Subcutaneous 3 times per day  . insulin detemir  10 Units Subcutaneous Daily  . insulin detemir  40 Units Subcutaneous QHS  .  ipratropium-albuterol  3 mL Nebulization BID  . liver oil-zinc oxide   Topical 5 X Daily  . mouth rinse  15 mL Mouth Rinse q12n4p  . melatonin  5 mg Per Tube QHS  . metoCLOPramide (REGLAN) injection  10 mg Intravenous Q8H  . oxyCODONE  15 mg Per Tube  Q8H  . pantoprazole sodium  40 mg Per Tube QHS  . potassium chloride  40 mEq Per Tube Once  . QUEtiapine  50 mg Per Tube QHS  . sennosides  10 mL Per Tube QHS  . sodium chloride flush  10-40 mL Intracatheter Q12H    Infusions: . sodium chloride    . sodium chloride Stopped (04/29/20 1611)  . albumin human Stopped (04/23/20 0041)  . bivalirudin (ANGIOMAX) infusion 0.5 mg/mL (Non-ACS indications) 0.085 mg/kg/hr (05/01/20 0600)  . vancomycin Stopped (04/30/20 2056)    PRN Medications: Place/Maintain arterial line **AND** sodium chloride, acetaminophen (TYLENOL) oral liquid 160 mg/5 mL, albumin human, [DISCONTINUED] lidocaine **AND** albuterol, Gerhardt's butt cream, guaiFENesin, hydrALAZINE, labetalol, lip balm, ondansetron (ZOFRAN) IV, oxyCODONE, phenol, polyethylene glycol, promethazine-codeine, simethicone, sodium chloride, sodium chloride flush   Assessment/Plan   1. Acute hypoxemic respiratory failure: Due to COVID-19 PNA with bilateral infiltrates.  Refractory hypoxemia, VV-ECMO cannulation on 02/16/2020 with improvement in oxygenation.  Developed left PTX post-subclavian CVL and had left chest tube, the left lung is re-expanded and CT out.  He was extubated 1/2 but reintubated 1/4 with agitation and suspected aspiration.  Tracheostomy 1/6.  CTA chest 1/22 with suspected chronic PEs and ARDS. ECMO cannula repositioned 1/5. ECMO circuit changed 1/26. Extubated to HFNC on 2/5. LDH stable. Sweep at 2 but acidotic ABG, have struggled with hypercarbia from significant dead space ventilation.  Restarted abx 2/5 with sepsis, Enterococcus faecalis, now on vancomycin.  I/Os even yesterday with IV Lasix + acetazolamide.  - Sweep at 2 today with  respiratory acidosis, will increase to 3 to rest him some.  - Continue acetazolamide 250 mg bid and Lasix 40 mg IV bid today.  - On vancomycin with Enterococcus.  ID has seen - Patient has had remdesivir, tocilizumab. - Completed steroid taper. - Continue bivalirudin, goal PTT 65-80.  PTT 60. Discussed dosing with PharmD personally. - Continue to mobilize => walking in hall now.  - Coreg + low dose diltiazem continues at 25 mg bid to increase fractional flow via ECMO circuit.  2. 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  Clot noted on TEE in RV as well.  TTE 1/2 showed normal EF 60-65%, RV improved (mildly dilated/dysfunctional). TEE on 1/5 with moderate to severe RV dysfunction but patient was hypoxemic.  Had 1/2 dose TPA on 1/5. Echo 1/20 with mildly dilated/mildly dysfunctional RV. CTA chest 1/22 with chronic-appearing PEs in upper lobes. - PTT 64. Bivalirudin for goal PTT 65-80. Discussed dosing with PharmD personally. 3. Left PTX: Left chest tube, lung is re-expanded. Tube now out, stable CXR. 4. Shock: Suspect septic/distributive.  Now resolved, off NE.  5. Anemia: Hgb 8.0, will go ahead and give 1 unit today.  6. AKI: Resolved 7. Hyperglycemia: insulin.  8. HTN: Controlled.     - Clonidine 0.1 bid. - Continue Coreg 25 mg bid.   - Diltiazem 30 bid.  9. CHB: Episode of CHB when hypoxemic and with cough (suspect vagal).  NSR since then.   He has occasional short vagal episodes.  - Continue Coreg/diltiazem, watch rhythm.  10. Thrombocytopenia: Resolved  11. FEN: Continue tube feeds.  12. Ischemic digits: LUE.  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  Repeat study 1/17 showed no obstruction.  - Wound Care following. 13. ID: Group F Strep and Enterococcus faecalis in sputum. Initially completed abx for these bacteria. Now with recurrent sepsis, Enterococcus faecalis in blood now. Vancomycin restarted.  TEE 2/7  with no vegetation.  - Continue vancomycin to 2/21.   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   - resolved 15. Hypernatremia: Resolved. Continue free water. No change  CRITICAL CARE Performed by: Marca Ancona  Total critical care time: 35 minutes  Critical care time was exclusive of separately billable procedures and treating other patients.  Critical care was necessary to treat or prevent imminent or life-threatening deterioration.  Critical care was time spent personally by me on the following activities: development of treatment plan with patient and/or surrogate as well as nursing, discussions with consultants, evaluation of patient's response to treatment, examination of patient, obtaining history from patient or surrogate, ordering and performing treatments and interventions, ordering and review of laboratory studies, ordering and review of radiographic studies, pulse oximetry and re-evaluation of patient's condition.    Length of Stay: 35  Marca Ancona, MD  05/01/2020, 7:33 AM  Advanced Heart Failure Team Pager [phone] (M-F; 7a - 4p)  Please contact [facility] for night-coverage after hours (4p -7a ) and weekends on [email or web address]

## 2020-05-01 NOTE — Plan of Care (Signed)
  Problem: Coping: Goal: Psychosocial and spiritual needs will be supported Outcome: Progressing   Problem: Clinical Measurements: Goal: Respiratory complications will improve Outcome: Progressing Goal: Cardiovascular complication will be avoided Outcome: Progressing   Problem: Coping: Goal: Level of anxiety will decrease Outcome: Progressing   Problem: Pain Managment: Goal: General experience of comfort will improve Outcome: Progressing

## 2020-05-02 ENCOUNTER — Inpatient Hospital Stay (HOSPITAL_COMMUNITY): Payer: Medicaid Other

## 2020-05-02 DIAGNOSIS — R531 Weakness: Secondary | ICD-10-CM

## 2020-05-02 LAB — POCT I-STAT 7, (LYTES, BLD GAS, ICA,H+H)
Acid-Base Excess: 7 mmol/L — ABNORMAL HIGH (ref 0.0–2.0)
Acid-Base Excess: 2 mmol/L (ref 0.0–2.0)
Acid-Base Excess: 8 mmol/L — ABNORMAL HIGH (ref 0.0–2.0)
Acid-Base Excess: 7 mmol/L — ABNORMAL HIGH (ref 0.0–2.0)
Acid-Base Excess: 7 mmol/L — ABNORMAL HIGH (ref 0.0–2.0)
Acid-Base Excess: 10 mmol/L — ABNORMAL HIGH (ref 0.0–2.0)
Acid-Base Excess: 7 mmol/L — ABNORMAL HIGH (ref 0.0–2.0)
Acid-Base Excess: 7 mmol/L — ABNORMAL HIGH (ref 0.0–2.0)
Acid-Base Excess: 7 mmol/L — ABNORMAL HIGH (ref 0.0–2.0)
Acid-Base Excess: 8 mmol/L — ABNORMAL HIGH (ref 0.0–2.0)
Bicarbonate: 33.9 mmol/L — ABNORMAL HIGH (ref 20.0–28.0)
Bicarbonate: 29.5 mmol/L — ABNORMAL HIGH (ref 20.0–28.0)
Bicarbonate: 35.7 mmol/L — ABNORMAL HIGH (ref 20.0–28.0)
Bicarbonate: 34.9 mmol/L — ABNORMAL HIGH (ref 20.0–28.0)
Bicarbonate: 33.6 mmol/L — ABNORMAL HIGH (ref 20.0–28.0)
Bicarbonate: 33.2 mmol/L — ABNORMAL HIGH (ref 20.0–28.0)
Bicarbonate: 33.3 mmol/L — ABNORMAL HIGH (ref 20.0–28.0)
Bicarbonate: 35.4 mmol/L — ABNORMAL HIGH (ref 20.0–28.0)
Bicarbonate: 32 mmol/L — ABNORMAL HIGH (ref 20.0–28.0)
Bicarbonate: 32.9 mmol/L — ABNORMAL HIGH (ref 20.0–28.0)
Calcium, Ion: 1.25 mmol/L (ref 1.15–1.40)
Calcium, Ion: 1.17 mmol/L (ref 1.15–1.40)
Calcium, Ion: 1.24 mmol/L (ref 1.15–1.40)
Calcium, Ion: 1.27 mmol/L (ref 1.15–1.40)
Calcium, Ion: 1.26 mmol/L (ref 1.15–1.40)
Calcium, Ion: 1.21 mmol/L (ref 1.15–1.40)
Calcium, Ion: 1.23 mmol/L (ref 1.15–1.40)
Calcium, Ion: 1.24 mmol/L (ref 1.15–1.40)
Calcium, Ion: 1.25 mmol/L (ref 1.15–1.40)
Calcium, Ion: 1.24 mmol/L (ref 1.15–1.40)
HCT: 27 % — ABNORMAL LOW (ref 39.0–52.0)
HCT: 24 % — ABNORMAL LOW (ref 39.0–52.0)
HCT: 28 % — ABNORMAL LOW (ref 39.0–52.0)
HCT: 27 % — ABNORMAL LOW (ref 39.0–52.0)
HCT: 27 % — ABNORMAL LOW (ref 39.0–52.0)
HCT: 28 % — ABNORMAL LOW (ref 39.0–52.0)
HCT: 28 % — ABNORMAL LOW (ref 39.0–52.0)
HCT: 28 % — ABNORMAL LOW (ref 39.0–52.0)
HCT: 26 % — ABNORMAL LOW (ref 39.0–52.0)
HCT: 27 % — ABNORMAL LOW (ref 39.0–52.0)
Hemoglobin: 9.2 g/dL — ABNORMAL LOW (ref 13.0–17.0)
Hemoglobin: 8.2 g/dL — ABNORMAL LOW (ref 13.0–17.0)
Hemoglobin: 9.5 g/dL — ABNORMAL LOW (ref 13.0–17.0)
Hemoglobin: 9.2 g/dL — ABNORMAL LOW (ref 13.0–17.0)
Hemoglobin: 9.2 g/dL — ABNORMAL LOW (ref 13.0–17.0)
Hemoglobin: 9.5 g/dL — ABNORMAL LOW (ref 13.0–17.0)
Hemoglobin: 9.5 g/dL — ABNORMAL LOW (ref 13.0–17.0)
Hemoglobin: 9.5 g/dL — ABNORMAL LOW (ref 13.0–17.0)
Hemoglobin: 8.8 g/dL — ABNORMAL LOW (ref 13.0–17.0)
Hemoglobin: 9.2 g/dL — ABNORMAL LOW (ref 13.0–17.0)
O2 Saturation: 97 %
O2 Saturation: 74 %
O2 Saturation: 100 %
O2 Saturation: 99 %
O2 Saturation: 89 %
O2 Saturation: 100 %
O2 Saturation: 70 %
O2 Saturation: 90 %
O2 Saturation: 92 %
O2 Saturation: 96 %
Patient temperature: 37.4
Patient temperature: 99.1
Patient temperature: 98.8
Potassium: 4.1 mmol/L (ref 3.5–5.1)
Potassium: 3.6 mmol/L (ref 3.5–5.1)
Potassium: 4.2 mmol/L (ref 3.5–5.1)
Potassium: 4 mmol/L (ref 3.5–5.1)
Potassium: 4 mmol/L (ref 3.5–5.1)
Potassium: 3.9 mmol/L (ref 3.5–5.1)
Potassium: 4 mmol/L (ref 3.5–5.1)
Potassium: 4 mmol/L (ref 3.5–5.1)
Potassium: 3.8 mmol/L (ref 3.5–5.1)
Potassium: 3.7 mmol/L (ref 3.5–5.1)
Sodium: 134 mmol/L — ABNORMAL LOW (ref 135–145)
Sodium: 136 mmol/L (ref 135–145)
Sodium: 133 mmol/L — ABNORMAL LOW (ref 135–145)
Sodium: 134 mmol/L — ABNORMAL LOW (ref 135–145)
Sodium: 134 mmol/L — ABNORMAL LOW (ref 135–145)
Sodium: 132 mmol/L — ABNORMAL LOW (ref 135–145)
Sodium: 134 mmol/L — ABNORMAL LOW (ref 135–145)
Sodium: 134 mmol/L — ABNORMAL LOW (ref 135–145)
Sodium: 133 mmol/L — ABNORMAL LOW (ref 135–145)
Sodium: 134 mmol/L — ABNORMAL LOW (ref 135–145)
TCO2: 36 mmol/L — ABNORMAL HIGH (ref 22–32)
TCO2: 31 mmol/L (ref 22–32)
TCO2: 38 mmol/L — ABNORMAL HIGH (ref 22–32)
TCO2: 37 mmol/L — ABNORMAL HIGH (ref 22–32)
TCO2: 35 mmol/L — ABNORMAL HIGH (ref 22–32)
TCO2: 35 mmol/L — ABNORMAL HIGH (ref 22–32)
TCO2: 35 mmol/L — ABNORMAL HIGH (ref 22–32)
TCO2: 37 mmol/L — ABNORMAL HIGH (ref 22–32)
TCO2: 33 mmol/L — ABNORMAL HIGH (ref 22–32)
TCO2: 34 mmol/L — ABNORMAL HIGH (ref 22–32)
pCO2 arterial: 64.5 mmHg — ABNORMAL HIGH (ref 32.0–48.0)
pCO2 arterial: 60 mmHg — ABNORMAL HIGH (ref 32.0–48.0)
pCO2 arterial: 66.1 mmHg (ref 32.0–48.0)
pCO2 arterial: 71.8 mmHg (ref 32.0–48.0)
pCO2 arterial: 56.6 mmHg — ABNORMAL HIGH (ref 32.0–48.0)
pCO2 arterial: 51.4 mmHg — ABNORMAL HIGH (ref 32.0–48.0)
pCO2 arterial: 55.5 mmHg — ABNORMAL HIGH (ref 32.0–48.0)
pCO2 arterial: 57.3 mmHg — ABNORMAL HIGH (ref 32.0–48.0)
pCO2 arterial: 45.1 mmHg (ref 32.0–48.0)
pCO2 arterial: 49.5 mmHg — ABNORMAL HIGH (ref 32.0–48.0)
pH, Arterial: 7.331 — ABNORMAL LOW (ref 7.350–7.450)
pH, Arterial: 7.299 — ABNORMAL LOW (ref 7.350–7.450)
pH, Arterial: 7.34 — ABNORMAL LOW (ref 7.350–7.450)
pH, Arterial: 7.294 — ABNORMAL LOW (ref 7.350–7.450)
pH, Arterial: 7.381 (ref 7.350–7.450)
pH, Arterial: 7.372 (ref 7.350–7.450)
pH, Arterial: 7.385 (ref 7.350–7.450)
pH, Arterial: 7.446 (ref 7.350–7.450)
pH, Arterial: 7.46 — ABNORMAL HIGH (ref 7.350–7.450)
pH, Arterial: 7.431 (ref 7.350–7.450)
pO2, Arterial: 102 mmHg (ref 83.0–108.0)
pO2, Arterial: 45 mmHg — ABNORMAL LOW (ref 83.0–108.0)
pO2, Arterial: 453 mmHg — ABNORMAL HIGH (ref 83.0–108.0)
pO2, Arterial: 146 mmHg — ABNORMAL HIGH (ref 83.0–108.0)
pO2, Arterial: 58 mmHg — ABNORMAL LOW (ref 83.0–108.0)
pO2, Arterial: 39 mmHg — CL (ref 83.0–108.0)
pO2, Arterial: 501 mmHg — ABNORMAL HIGH (ref 83.0–108.0)
pO2, Arterial: 60 mmHg — ABNORMAL LOW (ref 83.0–108.0)
pO2, Arterial: 61 mmHg — ABNORMAL LOW (ref 83.0–108.0)
pO2, Arterial: 84 mmHg (ref 83.0–108.0)

## 2020-05-02 LAB — CBC
HCT: 27 % — ABNORMAL LOW (ref 39.0–52.0)
HCT: 27.9 % — ABNORMAL LOW (ref 39.0–52.0)
Hemoglobin: 9.2 g/dL — ABNORMAL LOW (ref 13.0–17.0)
Hemoglobin: 9.1 g/dL — ABNORMAL LOW (ref 13.0–17.0)
MCH: 31.7 pg (ref 26.0–34.0)
MCH: 29.9 pg (ref 26.0–34.0)
MCHC: 34.1 g/dL (ref 30.0–36.0)
MCHC: 32.6 g/dL (ref 30.0–36.0)
MCV: 93.1 fL (ref 80.0–100.0)
MCV: 91.8 fL (ref 80.0–100.0)
Platelets: 227 10*3/uL (ref 150–400)
Platelets: 231 10*3/uL (ref 150–400)
RBC: 2.9 MIL/uL — ABNORMAL LOW (ref 4.22–5.81)
RBC: 3.04 MIL/uL — ABNORMAL LOW (ref 4.22–5.81)
RDW: 16.8 % — ABNORMAL HIGH (ref 11.5–15.5)
RDW: 16.3 % — ABNORMAL HIGH (ref 11.5–15.5)
WBC: 9.1 10*3/uL (ref 4.0–10.5)
WBC: 8.9 10*3/uL (ref 4.0–10.5)
nRBC: 0.2 % (ref 0.0–0.2)
nRBC: 0 % (ref 0.0–0.2)

## 2020-05-02 LAB — TYPE AND SCREEN
ABO/RH(D): B POS
Antibody Screen: NEGATIVE
Unit division: 0
Unit division: 0
Unit division: 0
Unit division: 0

## 2020-05-02 LAB — GLUCOSE, CAPILLARY
Glucose-Capillary: 137 mg/dL — ABNORMAL HIGH (ref 70–99)
Glucose-Capillary: 174 mg/dL — ABNORMAL HIGH (ref 70–99)
Glucose-Capillary: 177 mg/dL — ABNORMAL HIGH (ref 70–99)
Glucose-Capillary: 195 mg/dL — ABNORMAL HIGH (ref 70–99)
Glucose-Capillary: 195 mg/dL — ABNORMAL HIGH (ref 70–99)
Glucose-Capillary: 221 mg/dL — ABNORMAL HIGH (ref 70–99)
Glucose-Capillary: 226 mg/dL — ABNORMAL HIGH (ref 70–99)

## 2020-05-02 LAB — BPAM RBC
Blood Product Expiration Date: 202203152359
Blood Product Expiration Date: 202203162359
Blood Product Expiration Date: 202203162359
Blood Product Expiration Date: 202203162359
ISSUE DATE / TIME: 202202190853
Unit Type and Rh: 7300
Unit Type and Rh: 7300
Unit Type and Rh: 7300
Unit Type and Rh: 7300

## 2020-05-02 LAB — BASIC METABOLIC PANEL
Anion gap: 7 (ref 5–15)
Anion gap: 9 (ref 5–15)
BUN: 29 mg/dL — ABNORMAL HIGH (ref 6–20)
BUN: 28 mg/dL — ABNORMAL HIGH (ref 6–20)
CO2: 31 mmol/L (ref 22–32)
CO2: 29 mmol/L (ref 22–32)
Calcium: 8.8 mg/dL — ABNORMAL LOW (ref 8.9–10.3)
Calcium: 8.9 mg/dL (ref 8.9–10.3)
Chloride: 94 mmol/L — ABNORMAL LOW (ref 98–111)
Chloride: 94 mmol/L — ABNORMAL LOW (ref 98–111)
Creatinine, Ser: 0.67 mg/dL (ref 0.61–1.24)
Creatinine, Ser: 0.61 mg/dL (ref 0.61–1.24)
GFR, Estimated: >60 mL/min (ref >60)
GFR, Estimated: >60 mL/min (ref >60)
Glucose, Bld: 232 mg/dL — ABNORMAL HIGH (ref 70–99)
Glucose, Bld: 197 mg/dL — ABNORMAL HIGH (ref 70–99)
Potassium: 4 mmol/L (ref 3.5–5.1)
Potassium: 3.8 mmol/L (ref 3.5–5.1)
Sodium: 132 mmol/L — ABNORMAL LOW (ref 135–145)
Sodium: 132 mmol/L — ABNORMAL LOW (ref 135–145)

## 2020-05-02 LAB — LACTATE DEHYDROGENASE: LDH: 410 U/L — ABNORMAL HIGH (ref 98–192)

## 2020-05-02 LAB — LACTIC ACID, PLASMA
Lactic Acid, Venous: 0.8 mmol/L (ref 0.5–1.9)
Lactic Acid, Venous: 1.2 mmol/L (ref 0.5–1.9)

## 2020-05-02 LAB — PROTIME-INR
INR: 1.4 — ABNORMAL HIGH (ref 0.8–1.2)
Prothrombin Time: 16.8 seconds — ABNORMAL HIGH (ref 11.4–15.2)

## 2020-05-02 LAB — APTT
aPTT: 65 seconds — ABNORMAL HIGH (ref 24–36)
aPTT: 59 seconds — ABNORMAL HIGH (ref 24–36)

## 2020-05-02 LAB — FIBRINOGEN: Fibrinogen: 352 mg/dL (ref 210–475)

## 2020-05-02 MED ORDER — SERTRALINE HCL 50 MG PO TABS: 25.0000 mg | ORAL_TABLET | Freq: Every day | ORAL | Status: DC

## 2020-05-02 MED ORDER — INSULIN DETEMIR 100 UNIT/ML ~~LOC~~ SOLN
15.0000 [IU] | Freq: Every day | SUBCUTANEOUS | Status: DC
  Administered 2020-05-02: 15 [IU] via SUBCUTANEOUS
  Filled 2020-05-02 (×2): qty 0.15

## 2020-05-02 MED ORDER — QUETIAPINE FUMARATE 50 MG PO TABS
50.0000 mg | ORAL_TABLET | Freq: Every day | ORAL | Status: DC
  Administered 2020-05-02 – 2020-05-08 (×7): 50 mg via ORAL
  Filled 2020-05-02 (×7): qty 1

## 2020-05-02 MED ORDER — ROSUVASTATIN CALCIUM 20 MG PO TABS
40.0000 mg | ORAL_TABLET | Freq: Every day | ORAL | Status: AC
  Administered 2020-05-02 – 2020-05-07 (×6): 40 mg via ORAL
  Filled 2020-05-02 (×6): qty 2

## 2020-05-02 MED ORDER — CLONAZEPAM 1 MG PO TABS
1.0000 mg | ORAL_TABLET | Freq: Two times a day (BID) | ORAL | Status: AC
  Administered 2020-05-02 – 2020-05-04 (×6): 1 mg via ORAL
  Filled 2020-05-02 (×6): qty 1

## 2020-05-02 MED ORDER — SERTRALINE HCL 20 MG/ML PO CONC: 25.0000 mg | Freq: Every day | ORAL | Status: DC

## 2020-05-02 MED ORDER — SERTRALINE HCL 50 MG PO TABS
25.0000 mg | ORAL_TABLET | Freq: Every day | ORAL | Status: DC
  Administered 2020-05-02 – 2020-05-08 (×7): 25 mg via ORAL
  Filled 2020-05-02 (×7): qty 1

## 2020-05-02 MED ORDER — CLONIDINE HCL 0.1 MG PO TABS
0.1000 mg | ORAL_TABLET | Freq: Every day | ORAL | Status: AC
  Administered 2020-05-02: 0.1 mg via ORAL
  Filled 2020-05-02: qty 1

## 2020-05-02 MED ORDER — MELATONIN 5 MG PO TABS
5.0000 mg | ORAL_TABLET | Freq: Every day | ORAL | Status: DC
  Administered 2020-05-02 – 2020-05-08 (×7): 5 mg via ORAL
  Filled 2020-05-02 (×7): qty 1

## 2020-05-02 MED ORDER — CLONAZEPAM 1 MG PO TABS: 1.0000 mg | ORAL_TABLET | Freq: Four times a day (QID) | ORAL | Status: DC | PRN

## 2020-05-02 MED ORDER — CLONAZEPAM 1 MG PO TABS: 1.0000 mg | ORAL_TABLET | Freq: Two times a day (BID) | ORAL | Status: DC

## 2020-05-02 MED ORDER — POTASSIUM CHLORIDE 20 MEQ PO PACK
40.0000 meq | PACK | Freq: Once | ORAL | Status: AC
  Administered 2020-05-02: 40 meq
  Filled 2020-05-02: qty 2

## 2020-05-02 MED ORDER — DOXAZOSIN MESYLATE 2 MG PO TABS
2.0000 mg | ORAL_TABLET | Freq: Every day | ORAL | Status: DC
  Administered 2020-05-02 – 2020-05-03 (×2): 2 mg via ORAL
  Filled 2020-05-02 (×3): qty 1

## 2020-05-02 NOTE — Plan of Care (Signed)
  Problem: Clinical Measurements: Goal: Respiratory complications will improve Outcome: Progressing Goal: Cardiovascular complication will be avoided Outcome: Progressing   Problem: Coping: Goal: Level of anxiety will decrease Outcome: Progressing   Problem: Pain Managment: Goal: General experience of comfort will improve Outcome: Progressing   Problem: Elimination: Goal: Will not experience complications related to bowel motility Outcome: Progressing

## 2020-05-02 NOTE — Progress Notes (Signed)
ANTICOAGULATION CONSULT NOTE  Pharmacy Consult for bivalirudin Indication: ECMO and VTE  Labs: Recent Labs    04/30/20 0420 04/30/20 0426 05/01/20 0406 05/01/20 0415 05/01/20 1549 05/01/20 1605 05/01/20 1717 05/01/20 2004 05/02/20 0353 05/02/20 0355 05/02/20 1253 05/02/20 1616 05/02/20 1623  HGB 8.1*   < > 8.0*   < > 9.4*   < >  --    < > 9.2*   < > 9.5* 9.1* 8.8*  HCT 23.9*   < > 25.5*   < > 27.6*   < >  --    < > 27.0*   < > 28.0* 27.9* 26.0*  PLT 202   < > 216   < > 233  --   --   --  227  --   --  231  --   APTT 64*   < > 60*  --  48*  --  72*  --  65*  --   --  59*  --   LABPROT 16.7*  --  16.7*  --   --   --   --   --  16.8*  --   --   --   --   INR 1.4*  --  1.4*  --   --   --   --   --  1.4*  --   --   --   --   CREATININE 0.73   < > 0.68  --  0.64  --   --   --  0.67  --   --  0.61  --    < > = values in this interval not displayed.    Assessment: 40 yoM admitted with COVID-19 PNA with worsening hypoxia, s/p cannulation for ECMO. Pt was started on IV heparin prior to cannulation due to acute DVTs and possible PE, transitioned to bivalirudin with ECMO. Now s/p tPA on 1/5 and tracheostomy on 1/6. Circuit last changed 1/26.  aPTT just slightly below goal this evening at 59.  No overt bleeding or complications noted.  Overall aPTTs slowly trending down.  Goal of Therapy:  aPTT 60-80 seconds   Plan:  -Increase bivalirudin to 0.087 mg/kg/hr (using order-specific wt 72.1kg) -Monitor q12h aPTT/CBC, LDH, and for s/sx of bleeding  Jenetta Downer, [facility] Clinical Pharmacist  05/02/2020 5:06 PM   [facility] pharmacy phone numbers are listed on [email or web address]

## 2020-05-02 NOTE — Progress Notes (Signed)
Patient ID: Geovanni Rahming, male   DOB: 03/28/1970, 50 y.o.   MRN: [id number]  This NP visited patient at the bedside as a follow up for palliative medcien needs and emotional support.  Medical records reviewed.  Discussed with treatment team  49 y.o.malewith no significantpast medical history admitted on12/28/2021with dyspnea, cough, nausea/vomiting ~ 1 week ago with worsening symptoms of body aches and fatigue and +COVID 02/07/20 and admitted with shortness of breath.Required intubation 03/10/20. Cannulated for VV ECMO 02/29/2020. Oxygenating better with ECMO. Also evidence of RLE DVT, RV thrombus, and high suspicion of PE.   Today is day 67 of this hospitalization.  Patient is  independent from  ventilator, remains on ECMO.      Patient is critically ill and making  slow prognosis, he continues to work with therapies    He remains  high risk for decompensation  Created space and opportunity for [name] to explore his thoughts and feelings regarding his current medical situation.  He verbalizes mostly concerns around financial responsibilities. Emotional support and attempted reassurance to decrease concerns.  I let him know I would be sure to communicate his concerns with his friend Zollie Beckers  I spoke to Willaim Sheng, he is aware and working on things.   Education offered today with patient and  Zollie Beckers the importance of continued conversations with each other  and the medical providers regarding overall plan of care and treatment options,  ensuring decisions are within the context of the patients values and GOCs.    Patient is seriously ill and high risk for decompensation.  Questions and concerns addressed   Discussed with bedside RN       PMT will continue to support holistically  Total time spent on the unit was 25 minutes  Greater than 50% of the time was spent in counseling and coordination of care  Lorinda Creed NP  Palliative Medicine Team Team Phone # [phone] Pager  [phone]

## 2020-05-02 NOTE — Progress Notes (Addendum)
Patient ID: Alex [name], male   DOB: 1970-06-30, 50 y.o.   MRN: [id number]     Advanced Heart Failure Rounding Note  PCP-Cardiologist: No primary care provider on file.   Subjective:    - 12/30: VV ECMO cannulation - 12/31: Left chest tube replaced - 1/2: Extubated. Echo with EF 60-65%, mildly dilated RV with mildly decreased systolic function.  - 1/4: Agitated, suspected aspiration.  Re-intubated.  - 1/5: ECMO cannula repositioned under TEE guidance. TEE showed moderately dilated/moderate-severely dysfunctional RV in setting of hypoxemia. LUE DVT found.  Patient got 1/2 dose of TPA due to initial concern for large PE.  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  - 1/6: Tracheostomy - 1/7: Echo with mild RV dilation/mild RV dysfunction.  - 1/16: Left chest tube out - 1/17: LUE arterial dopplers repeated, showed no obstruction.  - 1/20: Echo with EF 65-70%, mildly D-shaped septum, mildly dilated and mildly dysfunctional RV.  - 1/22: CTA chest: Bilateral upper lobe PEs (suspect chronic), changes c/w ARDS - 1/23: Bronchoscopy showed semi-occlusive ?mass/polyp in the trachea.  - 1/24: Bronchoscopy showed resolution of 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 - 1/26: ECMO circuit changed.  - 2/1: CT chest showed diffuse bronchiectasis as well as diffuse opacity consistent with COVID-19 PNA with ARDS. - 2/3: Trach exchange - 2/5: Decannulated to HFNC - 2/6: Enterococcal PNA/bacteremia.  Echo showed EF 65-70%, normal-appearing RV, no vegetation noted.  - 2/7: TEE with no vegetation, EF 55-60%, RV low normal function with normal size.  - 2/16: Right thoracentesis 500 cc  Rested yesterday.  Depressed about situation, finances.  Low grade fever, 99.1, but WBCs not elevated.  I/Os listed as positive but weight down, did not catch all urine output.   Remains on vancomycin to 2/21.   ECMO parameters: 3500 rpm Flow 4.6 L/min Pvenous -82 Delta P 34 Sweep 4 HFNC 12 L  ABG  7.33/64/102/97% LDH 410 PTT 65 Lactate 0.6 Hgb 9.2  Objective:   Weight Range: 72.7 kg Body mass index is 27.51 kg/m.   Vital Signs:   Temp:  [98 F (36.7 C)-99.4 F (37.4 C)] 99.3 F (37.4 C) (02/20 0000) Pulse Rate:  [87-110] 102 (02/20 0400) Resp:  [10-30] 22 (02/20 0500) BP: (83-164)/(54-86) 122/75 (02/20 0400) SpO2:  [93 %-100 %] 100 % (02/20 0400) Arterial Line BP: (84-205)/(45-87) 190/71 (02/20 0500) Weight:  [72.7 kg-75.4 kg] 72.7 kg (02/20 0500) Last BM Date: 05/01/20  Weight change: Filed Weights   05/01/20 0500 05/01/20 1900 05/02/20 0500  Weight: 75 kg 75.4 kg 72.7 kg    Intake/Output:   Intake/Output Summary (Last 24 hours) at 05/02/2020 0740 Last data filed at 05/02/2020 0600 Gross per 24 hour  Intake 3323.29 ml  Output 1640 ml  Net 1683.29 ml      Physical Exam    General: NAD Neck: Thick. No JVD, no thyromegaly or thyroid nodule.  Lungs: Decreased at bases.  CV: Nondisplaced PMI.  Heart regular S1/S2, no S3/S4, no murmur.  No peripheral edema.   Abdomen: Soft, nontender, no hepatosplenomegaly, no distention.  Skin: Intact without lesions or rashes.  Neurologic: Alert and oriented x 3.  Psych: Normal affect. Extremities: Dry gangrene left digits.  HEENT: Normal.    Telemetry   NSR 100s. Personally reviewed   Labs    CBC Recent Labs    05/01/20 1549 05/01/20 1605 05/02/20 0353 05/02/20 0355  WBC 10.3  --  9.1  --   HGB 9.4*   < > 9.2*  9.2*  HCT 27.6*   < > 27.0* 27.0*  MCV 92.3  --  93.1  --   PLT 233  --  227  --    < > = values in this interval not displayed.   Basic Metabolic Panel Recent Labs    27/25/36 1549 05/01/20 1605 05/02/20 0353 05/02/20 0355  NA 134*   < > 132* 134*  K 3.8   < > 4.0 4.1  CL 94*  --  94*  --   CO2 33*  --  31  --   GLUCOSE 149*  --  232*  --   BUN 27*  --  29*  --   CREATININE 0.64  --  0.67  --   CALCIUM 8.8*  --  8.8*  --    < > = values in this interval not displayed.   Liver  Function Tests No results for input(s): AST, ALT, ALKPHOS, BILITOT, PROT, ALBUMIN in the last 72 hours. No results for input(s): LIPASE, AMYLASE in the last 72 hours. Cardiac Enzymes No results for input(s): CKTOTAL, CKMB, CKMBINDEX, TROPONINI in the last 72 hours.  BNP: BNP (last 3 results) No results for input(s): BNP in the last 8760 hours.  ProBNP (last 3 results) No results for input(s): PROBNP in the last 8760 hours.   D-Dimer No results for input(s): DDIMER in the last 72 hours. Hemoglobin A1C No results for input(s): HGBA1C in the last 72 hours. Fasting Lipid Panel No results for input(s): CHOL, HDL, LDLCALC, TRIG, CHOLHDL, LDLDIRECT in the last 72 hours. Thyroid Function Tests No results for input(s): TSH, T4TOTAL, T3FREE, THYROIDAB in the last 72 hours.  Invalid input(s): FREET3  Other results:   Imaging    No results found.   Medications:     Scheduled Medications: . acetaZOLAMIDE  250 mg Per Tube BID  . bethanechol  10 mg Per Tube TID  . carvedilol  25 mg Per Tube BID WC  . chlorhexidine  15 mL Mouth Rinse BID  . Chlorhexidine Gluconate Cloth  6 each Topical Daily  . chlorpheniramine-HYDROcodone  5 mL Per Tube Q12H  . clonazePAM  1 mg Per Tube BID  . cloNIDine  0.1 mg Per Tube Daily  . dextromethorphan  30 mg Per Tube TID  . diltiazem  30 mg Oral Q12H  . docusate  100 mg Per Tube BID  . doxazosin  2 mg Per Tube Daily  . feeding supplement  237 mL Oral TID BM  . feeding supplement (PIVOT 1.5 CAL)  1,000 mL Per Tube Q24H  . fiber  1 packet Per Tube BID  . furosemide  40 mg Intravenous BID  . gabapentin  300 mg Per Tube Q8H  . insulin aspart  0-20 Units Subcutaneous Q4H  . insulin aspart  4 Units Subcutaneous 3 times per day  . insulin detemir  10 Units Subcutaneous Daily  . insulin detemir  40 Units Subcutaneous QHS  . ipratropium-albuterol  3 mL Nebulization BID  . liver oil-zinc oxide   Topical 5 X Daily  . mouth rinse  15 mL Mouth Rinse q12n4p   . melatonin  5 mg Per Tube QHS  . metoCLOPramide (REGLAN) injection  10 mg Intravenous Q8H  . pantoprazole sodium  40 mg Per Tube QHS  . QUEtiapine  50 mg Per Tube QHS  . sennosides  10 mL Per Tube QHS  . sertraline  25 mg Per Tube Daily  . sodium chloride flush  10-40 mL  Intracatheter Q12H    Infusions: . sodium chloride    . sodium chloride Stopped (04/29/20 1611)  . albumin human Stopped (04/23/20 0041)  . bivalirudin (ANGIOMAX) infusion 0.5 mg/mL (Non-ACS indications) 0.085 mg/kg/hr (05/02/20 0400)  . vancomycin Stopped (05/01/20 2103)    PRN Medications: Place/Maintain arterial line **AND** sodium chloride, acetaminophen (TYLENOL) oral liquid 160 mg/5 mL, albumin human, [DISCONTINUED] lidocaine **AND** albuterol, Gerhardt's butt cream, guaiFENesin, hydrALAZINE, labetalol, lip balm, ondansetron (ZOFRAN) IV, oxyCODONE, phenol, polyethylene glycol, promethazine-codeine, simethicone, sodium chloride, sodium chloride flush   Assessment/Plan   1. Acute hypoxemic respiratory failure: Due to COVID-19 PNA with bilateral infiltrates.  Refractory hypoxemia, VV-ECMO cannulation on 13-Aug-2019 with improvement in oxygenation.  Developed left PTX post-subclavian CVL and had left chest tube, the left lung is re-expanded and CT out.  He was extubated 1/2 but reintubated 1/4 with agitation and suspected aspiration.  Tracheostomy 1/6.  CTA chest 1/22 with suspected chronic PEs and ARDS. ECMO cannula repositioned 1/5. ECMO circuit changed 1/26. Extubated to HFNC on 2/5. LDH stable. Sweep at 4, have struggled with hypercarbia from significant dead space ventilation.  Restarted abx 2/5 with sepsis, Enterococcus faecalis, now on vancomycin.  I/Os positive but weight down with IV Lasix + acetazolamide.  - Sweep at 4 today, drop slowly to 3.5.  - Pre and post gas showed minimal change in PaCO2 with appropriate change PaO2.  Will likely need circuit change, can wait to tomorrow.  - Continue acetazolamide 250 mg  bid and Lasix 40 mg IV bid today.  - On vancomycin with Enterococcus.  ID has seen - Patient has had remdesivir, tocilizumab. - Completed steroid taper. - Continue bivalirudin, goal PTT 65-80.  PTT 65. Discussed dosing with PharmD personally. - Continue to mobilize => have him walk today.  - Coreg + low dose diltiazem continues at 25 mg bid to increase fractional flow via ECMO circuit.  2. 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  Clot noted on TEE in RV as well.  TTE 1/2 showed normal EF 60-65%, RV improved (mildly dilated/dysfunctional). TEE on 1/5 with moderate to severe RV dysfunction but patient was hypoxemic.  Had 1/2 dose TPA on 1/5. Echo 1/20 with mildly dilated/mildly dysfunctional RV. CTA chest 1/22 with chronic-appearing PEs in upper lobes. - PTT 64. Bivalirudin for goal PTT 65-80. Discussed dosing with PharmD personally. 3. Left PTX: Left chest tube, lung is re-expanded. Tube now out, stable CXR. 4. Shock: Suspect septic/distributive.  Now resolved, off NE.  5. Anemia: Hgb 9.2, transfuse < 7.5.  6. AKI: Resolved 7. Hyperglycemia: insulin.  8. HTN: Controlled.     - Clonidine 0.1 bid. - Continue Coreg 25 mg bid.   - Diltiazem 30 bid.  9. CHB: Episode of CHB when hypoxemic and with cough (suspect vagal).  NSR since then.   He has occasional short vagal episodes.  - Continue Coreg/diltiazem, watch rhythm.  10. Thrombocytopenia: Resolved  11. FEN: Continue tube feeds.  12. Ischemic digits: LUE.  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  Repeat study 1/17 showed no obstruction.  - Wound Care following. 13. ID: Group F Strep and Enterococcus faecalis in sputum. Initially completed abx for these bacteria. Now with recurrent sepsis, Enterococcus faecalis in blood now. Vancomycin restarted. TEE 2/7 with no vegetation.  - Continue vancomycin to 2/21.   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   -  resolved 15. Hypernatremia: Resolved. Continue free water. No change 16. Depression: Start sertraline 25 mg daily.   CRITICAL CARE Performed by: Marca Ancona  Total critical care time: 35 minutes  Critical care time was exclusive of separately billable procedures and treating other patients.  Critical care was necessary to treat or prevent imminent or life-threatening deterioration.  Critical care was time spent personally by me on the following activities: development of treatment plan with patient and/or surrogate as well as nursing, discussions with consultants, evaluation of patient's response to treatment, examination of patient, obtaining history from patient or surrogate, ordering and performing treatments and interventions, ordering and review of laboratory studies, ordering and review of radiographic studies, pulse oximetry and re-evaluation of patient's condition.    Length of Stay: 29  Marca Ancona, MD  05/02/2020, 7:40 AM  Advanced Heart Failure Team Pager [phone] (M-F; 7a - 4p)  Please contact [facility] for night-coverage after hours (4p -7a ) and weekends on [email or web address]

## 2020-05-02 NOTE — Progress Notes (Signed)
Critical Care Progress Note  NAME:  Alex [name], MRN:  [id number], DOB:  [birth year], LOS: 54 ADMISSION DATE:  03/04/2020, CONSULTATION DATE: 02/20/2020 REFERRING MD: Wynona Neat -LBPCCM, CHIEF COMPLAINT: Respiratory failure requiring ECMO  HPI/course in hospital  50 year old man admitted to hospital 12/28 with 1 week history of dyspnea cough nausea and vomiting.  Initially admitted to [facility] and placed on high flow nasal cannula but rapidly failed and required intubation 12/29.  Persistent hypoxic respiratory failure with PF ratio 55 in spite of 18 of PEEP FiO2 0.1 despite paralytics.  Did not improve with prone ventilation  Cannulated for VV ECMO 12/30 via right IJ crescent cannula.  ECMO circuit was changed on 04/07/2020 Iatrogenic pneumothorax from left subclavian triple-lumen placement  12/28 admitted covid, ARDS 12/30 ECMO cannulation, iatrogenic pneumothrorax, DVT, Bivalrudin started           Left subclavian hematoma, chest tube, ceftriaxone/ azithromycin started 12/31 1Unit PRBC 1/1 Palliative consult, noted HTN, fentanyl only 1/2 Extubation I/O positive, left lung re-expanded, EF 60-65%, Lasix 20mg  BID 1/3 BiPAP in place, HTN to 190s, 200s, labetolol for BP, I/O even, cefepime and vancomycin started 1/4 agitation off BiPAP, possible aspiration 1/5 agitation overnight, reintubated      ECHO with RV dilation, ECMO cannula repositioned, LUE DVT (+), 1/2 dose tPA given,      I/O up, on lasix, Epi, NE, vasopressin started, HR/ rhythm issues noted 1/6 Tracheostomy, hypoglycemic when off tube feeds       Cortrack tune issues 1/7 on lasix gtt 4mg /hr with diuresis, agitation improved 1/8 starting steroid taper 1/9 severe agitation, heavy sedation required, lasix gtt 6mg /hr, I/Os (-)       precedex and fentanyl 1/10 Vanc stopped 1/11 sweep to 2, lasix gtt at 4mg /hr, weight up, metoprolol added for HTN 1/12 fevers to 99 noted, lasix gtt and metazolone 1/13 lasix gtt  dced 1/14 intermittent HTN, possibly flash pulmonary edema tied to sedation        Ischemic left hand changes, 1 Unit PRBCs 1/15 1/16 sweep from 4 to 2.5, chest tube removed 1/17 sweep at 6, trial of nebulized morphine for cough, LUE dopplers show no obstruction 1/18 two events of 2nd degree type II HB noted with coughing         sweep at 4, IV lasix 40, acetazolamide, distal XLT trach placement         agitation and BP spikes 1/19 agitation, air hunger, seroquel, klonopin, oxy, valproate, ketamine trial 1/20 sweep at 3.5, diuresis results in chugging, albumin given         1 Unit PRBCs, lidocaine nebulizers for cough, ancef started 1/21 sweep to 8, anxiety, I/Os (+) with lasix and acetazolamide 1/22 sweep at 5, chest CT bilateral upper lobe PEs 1/23 sweep at 5, delirium, I/Os even with lasix and acetazolamide         continued coughing, 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 1/24 sweep at 7.5, 1 Unit PRBCs, repeat bronchoscopy showed resolution of 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 1/25 sweep at 7.5, on propofol, lasix gtt at 97ml/hr, I/Os (+)         nebulized morphine and lidocaine for cough 1/26 sweep at 8, propofol/ precedex, cough present when weaned, lasix gtt at 78ml/hr        ECMO circuit changes, levophed started, 1 Unit PRBCs 1/27 sweep at 4, off sedation, delirium, I/Os even        lasix gtt restated, acetazolamide overnight 1/28 sweep at 7.5,  HTN labile 1/29 sweep of 6, patient reports a tickle in throat, cetacaine 1/30 sweep down to 6, cough improved with cetacaine and gabapentin 1/31 sweep at 5, agitation, off acetazolamide, lasix, weight up 2/1 cough, of pulmicort, cetacaine for cough, continued gabapentin, dexmethorphan       CT demonstrated trach placement against back of trachea 2/2 Trach collar trial somewhat effective in managing cough       low dose diltiazem for HR,  2/3 sweep at 4,trach cannula replacement (longer, more flexible bivona); brochoscopy  shows irritated mucosa       IV lasix and acetazolamide 2/4 sweep at 5, I/Os (+), BUN elevated, lasix gtt with diamox,       trach decannulation, increased gabapentin for cough 2/5 start HCAP coverage for rising WBC/temps, check Pct/cultures, CXR worse with lack of PEEP, diuresed well but started getting very hypotensive, sepsis vs. Too dry 04/18/20 blood cx grew E faecalis 2/7 PICC change, TEE 2/7>> sweep weans  Past Medical History  none  Interim history/subjective:  Had increased temp yesterday question developing infection, pan-cultured. He remains depressed this AM. Slept okay.  Objective   Blood pressure 122/75, pulse (!) 102, temperature (!) 97.4 F (36.3 C), temperature source Oral, resp. rate (!) 22, height 5\' 4"  (1.626 m), weight 72.7 kg, SpO2 99 %.        Intake/Output Summary (Last 24 hours) at 05/02/2020 0830 Last data filed at 05/02/2020 0600 Gross per 24 hour  Intake 2511.77 ml  Output 1640 ml  Net 871.77 ml   Filed Weights   05/01/20 0500 05/01/20 1900 05/02/20 0500  Weight: 75 kg 75.4 kg 72.7 kg    Examination: Constitutional: no acute distress sitting in chair  Eyes: EOMI, pupils equal Ears, nose, mouth, and throat: trach stoma I think is closed now, MMM Cardiovascular: tachy, ext warm Respiratory: diminished at bases, mildly tachypneic Gastrointestinal: slightly distended, hypoactive bS Skin: stable ischemic changes to left hand Neurologic: moves all 4 ext to command Psychiatric: depressed  ABG okay CXR stable BMP with hyponatremia  Assessment & Plan:  Acute hypoxemic/hypercapnic respiratory failure due to severe ARDS from COVID-19 pneumonia Probable acute PE, and RV dysfunction s/p TPA Refractory coughing- improved after trach removal but still an issue Strep group F/ enterococcal pneumonia- s/p 10 days vanc 1/29; recurrent with bacteremia on 2/5. R effusion- lymphocyte predominant transudate drained 2/16 - Continue VV ECMO, sweep weans titrated  to WOB/ABG - Cough regimen as ordered - Continue supplemental oxygen via nasal cannula - IS, flutter, OOB mobility to prevent muscle atrophy - Keep even with diuresis: lasix and acetazolamide BID, doing well with this - Today: watch chugging in circuit and delta P, may need circuit change soon, f/u culture data from 2/19  HTN.  Trials of reducing HR/BP have resulted in reduced pCO2 (likely related to fractional flow through ECMO circuit) -Continue coreg, taper clonidine (to end 2/20), continue dilt; goal to keep lower MAPs and HR to increase fractional circuit flow, no changes here  Hyponatremia- unclear cause, keep an eye on for now, no Rx obvious at this point  Acute delirium, with agitation- improved. -RASS goal -1 to 0, PRN, morphine PRN, oxycodone 15mg > now q8h, qHS seroquel, klonipin BID 1mg  -Plans to slowly wean this over next couple weeks: klonipin lowered 2/16, 2/20 add additional klonipin PRN -Seroquel 25 (2/17)>>75 (2/18) >> 50 (2/19)  Sepsis secondary to E faecalis bacteremia- secondary to PNA, has grown enterococcus in sputum prior, previous vanc course completed 1/29. TEE on  2/7 without vegetations. PICC changed on 2/7. -Continue vanc through 2/21 per ID  Gastroparesis with some element ileus: improved but occasional vomiting spells with coughing attacks.  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  - wound care following, appreciate dressing recommendations - keep A-line out of left arm   R arm swelling- duplex neg, NTD here, going to space out cuff measurements since we have arterial line  Hyperglycemia due to diabetes type II -Basal bolus insulin adjusted, goal CBG 100-180, AM levemir increased today  Acute urinary retention; no evidence of UTI based on UA Hematuria -failed  condom cath trial, foley replaced 2/7; again on 2/11 -increase mobility -continue cardura -on voiding trial, check intermittent bladder scans PRN  Deconditioning -con't working with PT, OT, SLP, appreciate help   Daily Goals Checklist  Pain/Anxiety/Delirium protocol (if indicated): see above VAP protocol (if indicated):  n/a DVT prophylaxis: bivalirudin GI prophylaxis: pantoprazole Glucose control: basal bolus insulin Mobility/therapy needs: Mobilization as tolerated Code Status: Full code Disposition: ICU  Patient critically ill due to ARDS Interventions to address this today ECMO titration Risk of deterioration without these interventions is high  I personally spent 33 minutes providing critical care not including any separately billable procedures  4/11 MD [facility] 04/12/2020 7:23 AM Prefer epic messenger for cross cover needs If after hours, please call E-link

## 2020-05-02 NOTE — Plan of Care (Signed)
  Problem: Health Behavior/Discharge Planning: Goal: Ability to manage health-related needs will improve Outcome: Progressing   Problem: Clinical Measurements: Goal: Ability to maintain clinical measurements within normal limits will improve Outcome: Progressing Goal: Will remain free from infection Outcome: Progressing Goal: Diagnostic test results will improve Outcome: Progressing Goal: Respiratory complications will improve Outcome: Progressing Goal: Cardiovascular complication will be avoided Outcome: Progressing   Problem: Activity: Goal: Risk for activity intolerance will decrease Outcome: Progressing   Problem: Coping: Goal: Level of anxiety will decrease Outcome: Progressing   Problem: Pain Managment: Goal: General experience of comfort will improve Outcome: Progressing   Problem: Elimination: Goal: Will not experience complications related to bowel motility Outcome: Progressing Goal: Will not experience complications related to urinary retention Outcome: Progressing   Problem: Safety: Goal: Ability to remain free from injury will improve Outcome: Progressing

## 2020-05-02 NOTE — Progress Notes (Signed)
ANTICOAGULATION CONSULT NOTE  Pharmacy Consult for bivalirudin Indication: ECMO and VTE  Labs: Recent Labs    04/30/20 0420 04/30/20 0426 05/01/20 0406 05/01/20 0415 05/01/20 1324 05/01/20 1332 05/01/20 1549 05/01/20 1605 05/01/20 1717 05/01/20 2004 05/02/20 0353 05/02/20 0355  HGB 8.1*   < > 8.0*   < > 8.5*   < > 9.4*   < >  --  9.9* 9.2* 9.2*  HCT 23.9*   < > 25.5*   < > 25.9*   < > 27.6*   < >  --  29.0* 27.0* 27.0*  PLT 202   < > 216  --  203  --  233  --   --   --  227  --   APTT 64*   < > 60*  --   --   --  48*  --  72*  --  65*  --   LABPROT 16.7*  --  16.7*  --   --   --   --   --   --   --  16.8*  --   INR 1.4*  --  1.4*  --   --   --   --   --   --   --  1.4*  --   CREATININE 0.73   < > 0.68  --   --   --  0.64  --   --   --  0.67  --    < > = values in this interval not displayed.    Assessment: 87 yoM admitted with COVID-19 PNA with worsening hypoxia, s/p cannulation for ECMO. Pt was started on IV heparin prior to cannulation due to acute DVTs and possible PE, transitioned to bivalirudin with ECMO. Now s/p tPA on 1/5 and tracheostomy on 1/6. Circuit last changed 1/26.  aPTT continues to be therapeutic at 65 sec. No bleeding reported. LDH down slightly 436 > 410. Hgb up to 9.2 s/p 1u PRBC yesterday, plt wnl.  Goal of Therapy:  aPTT 60-80 seconds   Plan:  -Continue bivalirudin at 0.085 mg/kg/hr (using order-specific wt 72.1kg) -Monitor q12h aPTT/CBC, LDH, and for s/sx of bleeding  Tama Headings, PharmD, BCPS PGY2 Cardiology Pharmacy Resident Phone: [phone] 05/02/2020  7:14 AM  Please check [email or web address] for unit-specific pharmacy phone numbers.

## 2020-05-02 NOTE — Plan of Care (Signed)
  Problem: Clinical Measurements: Goal: Respiratory complications will improve Outcome: Progressing Goal: Cardiovascular complication will be avoided Outcome: Progressing   Problem: Coping: Goal: Level of anxiety will decrease Outcome: Progressing   Problem: Pain Managment: Goal: General experience of comfort will improve Outcome: Progressing   Problem: Elimination: Goal: Will not experience complications related to bowel motility Outcome: Progressing   Problem: Activity: Goal: Ability to tolerate increased activity will improve Outcome: Completed/Met   Problem: Respiratory: Goal: Ability to maintain a clear airway and adequate ventilation will improve Outcome: Completed/Met   Problem: Role Relationship: Goal: Method of communication will improve Outcome: Completed/Met

## 2020-05-02 NOTE — Procedures (Signed)
Extracorporeal support note  ECLS cannulation date: 02/11/2020 Last circuit change: 04/07/2020  Indication: Acute hypoxic respiratory failure due to ARDS from COVID-19 pneumonia with RV dysfunction.   Configuration: Venovenous  Drainage cannula: 32 French crescent cannula via right IJ Return cannula: Same  Pump speed: 3500 RPM Pump flow: 4.63 L/min Pump used: Cardio help  Oxygenator: Cardio help O2 blender: 100% Sweep gas: 4 L  Circuit check: minimal thrombin in circuit, no clots.  stable Anticoagulant: Bivalirudin Anticoagulation targets: PTT 60-80  Changes in support: sweep weans as tolerated, keep even fluid-wise  Anticipated goals/duration of support: Bridge to recovery   Multidisciplinary ECMO rounds completed.  Lorin Glass, MD 05/02/20 8:29 AM [facility]

## 2020-05-03 ENCOUNTER — Inpatient Hospital Stay (HOSPITAL_COMMUNITY): Payer: Medicaid Other

## 2020-05-03 DIAGNOSIS — U071 COVID-19: Secondary | ICD-10-CM

## 2020-05-03 DIAGNOSIS — J9601 Acute respiratory failure with hypoxia: Secondary | ICD-10-CM

## 2020-05-03 LAB — POCT I-STAT 7, (LYTES, BLD GAS, ICA,H+H)
Acid-Base Excess: 11 mmol/L — ABNORMAL HIGH (ref 0.0–2.0)
Acid-Base Excess: 2 mmol/L (ref 0.0–2.0)
Acid-Base Excess: 3 mmol/L — ABNORMAL HIGH (ref 0.0–2.0)
Acid-Base Excess: 3 mmol/L — ABNORMAL HIGH (ref 0.0–2.0)
Acid-Base Excess: 4 mmol/L — ABNORMAL HIGH (ref 0.0–2.0)
Acid-Base Excess: 5 mmol/L — ABNORMAL HIGH (ref 0.0–2.0)
Acid-Base Excess: 5 mmol/L — ABNORMAL HIGH (ref 0.0–2.0)
Acid-Base Excess: 5 mmol/L — ABNORMAL HIGH (ref 0.0–2.0)
Acid-Base Excess: 5 mmol/L — ABNORMAL HIGH (ref 0.0–2.0)
Acid-Base Excess: 6 mmol/L — ABNORMAL HIGH (ref 0.0–2.0)
Acid-Base Excess: 6 mmol/L — ABNORMAL HIGH (ref 0.0–2.0)
Acid-Base Excess: 7 mmol/L — ABNORMAL HIGH (ref 0.0–2.0)
Acid-Base Excess: 7 mmol/L — ABNORMAL HIGH (ref 0.0–2.0)
Bicarbonate: 26.9 mmol/L (ref 20.0–28.0)
Bicarbonate: 29.2 mmol/L — ABNORMAL HIGH (ref 20.0–28.0)
Bicarbonate: 29.2 mmol/L — ABNORMAL HIGH (ref 20.0–28.0)
Bicarbonate: 29.2 mmol/L — ABNORMAL HIGH (ref 20.0–28.0)
Bicarbonate: 29.3 mmol/L — ABNORMAL HIGH (ref 20.0–28.0)
Bicarbonate: 29.4 mmol/L — ABNORMAL HIGH (ref 20.0–28.0)
Bicarbonate: 29.5 mmol/L — ABNORMAL HIGH (ref 20.0–28.0)
Bicarbonate: 30 mmol/L — ABNORMAL HIGH (ref 20.0–28.0)
Bicarbonate: 30.8 mmol/L — ABNORMAL HIGH (ref 20.0–28.0)
Bicarbonate: 30.9 mmol/L — ABNORMAL HIGH (ref 20.0–28.0)
Bicarbonate: 31.8 mmol/L — ABNORMAL HIGH (ref 20.0–28.0)
Bicarbonate: 32.2 mmol/L — ABNORMAL HIGH (ref 20.0–28.0)
Bicarbonate: 33.3 mmol/L — ABNORMAL HIGH (ref 20.0–28.0)
Calcium, Ion: 1.09 mmol/L — ABNORMAL LOW (ref 1.15–1.40)
Calcium, Ion: 1.09 mmol/L — ABNORMAL LOW (ref 1.15–1.40)
Calcium, Ion: 1.13 mmol/L — ABNORMAL LOW (ref 1.15–1.40)
Calcium, Ion: 1.16 mmol/L (ref 1.15–1.40)
Calcium, Ion: 1.16 mmol/L (ref 1.15–1.40)
Calcium, Ion: 1.17 mmol/L (ref 1.15–1.40)
Calcium, Ion: 1.18 mmol/L (ref 1.15–1.40)
Calcium, Ion: 1.19 mmol/L (ref 1.15–1.40)
Calcium, Ion: 1.21 mmol/L (ref 1.15–1.40)
Calcium, Ion: 1.23 mmol/L (ref 1.15–1.40)
Calcium, Ion: 1.24 mmol/L (ref 1.15–1.40)
Calcium, Ion: 1.25 mmol/L (ref 1.15–1.40)
Calcium, Ion: 1.26 mmol/L (ref 1.15–1.40)
HCT: 23 % — ABNORMAL LOW (ref 39.0–52.0)
HCT: 24 % — ABNORMAL LOW (ref 39.0–52.0)
HCT: 25 % — ABNORMAL LOW (ref 39.0–52.0)
HCT: 25 % — ABNORMAL LOW (ref 39.0–52.0)
HCT: 26 % — ABNORMAL LOW (ref 39.0–52.0)
HCT: 26 % — ABNORMAL LOW (ref 39.0–52.0)
HCT: 26 % — ABNORMAL LOW (ref 39.0–52.0)
HCT: 27 % — ABNORMAL LOW (ref 39.0–52.0)
HCT: 27 % — ABNORMAL LOW (ref 39.0–52.0)
HCT: 27 % — ABNORMAL LOW (ref 39.0–52.0)
HCT: 28 % — ABNORMAL LOW (ref 39.0–52.0)
HCT: 29 % — ABNORMAL LOW (ref 39.0–52.0)
HCT: 29 % — ABNORMAL LOW (ref 39.0–52.0)
Hemoglobin: 7.8 g/dL — ABNORMAL LOW (ref 13.0–17.0)
Hemoglobin: 8.2 g/dL — ABNORMAL LOW (ref 13.0–17.0)
Hemoglobin: 8.5 g/dL — ABNORMAL LOW (ref 13.0–17.0)
Hemoglobin: 8.5 g/dL — ABNORMAL LOW (ref 13.0–17.0)
Hemoglobin: 8.8 g/dL — ABNORMAL LOW (ref 13.0–17.0)
Hemoglobin: 8.8 g/dL — ABNORMAL LOW (ref 13.0–17.0)
Hemoglobin: 8.8 g/dL — ABNORMAL LOW (ref 13.0–17.0)
Hemoglobin: 9.2 g/dL — ABNORMAL LOW (ref 13.0–17.0)
Hemoglobin: 9.2 g/dL — ABNORMAL LOW (ref 13.0–17.0)
Hemoglobin: 9.2 g/dL — ABNORMAL LOW (ref 13.0–17.0)
Hemoglobin: 9.5 g/dL — ABNORMAL LOW (ref 13.0–17.0)
Hemoglobin: 9.9 g/dL — ABNORMAL LOW (ref 13.0–17.0)
Hemoglobin: 9.9 g/dL — ABNORMAL LOW (ref 13.0–17.0)
O2 Saturation: 100 %
O2 Saturation: 100 %
O2 Saturation: 100 %
O2 Saturation: 61 %
O2 Saturation: 78 %
O2 Saturation: 82 %
O2 Saturation: 84 %
O2 Saturation: 87 %
O2 Saturation: 89 %
O2 Saturation: 99 %
O2 Saturation: 99 %
O2 Saturation: 99 %
O2 Saturation: 99 %
Patient temperature: 36.5
Patient temperature: 36.7
Patient temperature: 36.7
Patient temperature: 36.8
Patient temperature: 36.8
Patient temperature: 36.9
Patient temperature: 36.9
Patient temperature: 36.9
Patient temperature: 37
Patient temperature: 37
Patient temperature: 37
Patient temperature: 37.1
Patient temperature: 98.8
Potassium: 3.7 mmol/L (ref 3.5–5.1)
Potassium: 3.8 mmol/L (ref 3.5–5.1)
Potassium: 3.8 mmol/L (ref 3.5–5.1)
Potassium: 3.8 mmol/L (ref 3.5–5.1)
Potassium: 3.9 mmol/L (ref 3.5–5.1)
Potassium: 4 mmol/L (ref 3.5–5.1)
Potassium: 4 mmol/L (ref 3.5–5.1)
Potassium: 4.1 mmol/L (ref 3.5–5.1)
Potassium: 4.2 mmol/L (ref 3.5–5.1)
Potassium: 4.3 mmol/L (ref 3.5–5.1)
Potassium: 4.5 mmol/L (ref 3.5–5.1)
Potassium: 4.6 mmol/L (ref 3.5–5.1)
Potassium: 5.5 mmol/L — ABNORMAL HIGH (ref 3.5–5.1)
Sodium: 131 mmol/L — ABNORMAL LOW (ref 135–145)
Sodium: 132 mmol/L — ABNORMAL LOW (ref 135–145)
Sodium: 132 mmol/L — ABNORMAL LOW (ref 135–145)
Sodium: 132 mmol/L — ABNORMAL LOW (ref 135–145)
Sodium: 132 mmol/L — ABNORMAL LOW (ref 135–145)
Sodium: 132 mmol/L — ABNORMAL LOW (ref 135–145)
Sodium: 133 mmol/L — ABNORMAL LOW (ref 135–145)
Sodium: 133 mmol/L — ABNORMAL LOW (ref 135–145)
Sodium: 133 mmol/L — ABNORMAL LOW (ref 135–145)
Sodium: 133 mmol/L — ABNORMAL LOW (ref 135–145)
Sodium: 134 mmol/L — ABNORMAL LOW (ref 135–145)
Sodium: 134 mmol/L — ABNORMAL LOW (ref 135–145)
Sodium: 135 mmol/L (ref 135–145)
TCO2: 28 mmol/L (ref 22–32)
TCO2: 30 mmol/L (ref 22–32)
TCO2: 31 mmol/L (ref 22–32)
TCO2: 31 mmol/L (ref 22–32)
TCO2: 31 mmol/L (ref 22–32)
TCO2: 31 mmol/L (ref 22–32)
TCO2: 31 mmol/L (ref 22–32)
TCO2: 31 mmol/L (ref 22–32)
TCO2: 32 mmol/L (ref 22–32)
TCO2: 33 mmol/L — ABNORMAL HIGH (ref 22–32)
TCO2: 33 mmol/L — ABNORMAL HIGH (ref 22–32)
TCO2: 34 mmol/L — ABNORMAL HIGH (ref 22–32)
TCO2: 34 mmol/L — ABNORMAL HIGH (ref 22–32)
pCO2 arterial: 32.7 mmHg (ref 32.0–48.0)
pCO2 arterial: 33.1 mmHg (ref 32.0–48.0)
pCO2 arterial: 37.3 mmHg (ref 32.0–48.0)
pCO2 arterial: 39.5 mmHg (ref 32.0–48.0)
pCO2 arterial: 39.9 mmHg (ref 32.0–48.0)
pCO2 arterial: 40.5 mmHg (ref 32.0–48.0)
pCO2 arterial: 43.3 mmHg (ref 32.0–48.0)
pCO2 arterial: 48 mmHg (ref 32.0–48.0)
pCO2 arterial: 50.7 mmHg — ABNORMAL HIGH (ref 32.0–48.0)
pCO2 arterial: 53.8 mmHg — ABNORMAL HIGH (ref 32.0–48.0)
pCO2 arterial: 54.2 mmHg — ABNORMAL HIGH (ref 32.0–48.0)
pCO2 arterial: 56.3 mmHg — ABNORMAL HIGH (ref 32.0–48.0)
pCO2 arterial: 58.7 mmHg — ABNORMAL HIGH (ref 32.0–48.0)
pH, Arterial: 7.341 — ABNORMAL LOW (ref 7.350–7.450)
pH, Arterial: 7.343 — ABNORMAL LOW (ref 7.350–7.450)
pH, Arterial: 7.346 — ABNORMAL LOW (ref 7.350–7.450)
pH, Arterial: 7.364 (ref 7.350–7.450)
pH, Arterial: 7.37 (ref 7.350–7.450)
pH, Arterial: 7.429 (ref 7.350–7.450)
pH, Arterial: 7.43 (ref 7.350–7.450)
pH, Arterial: 7.437 (ref 7.350–7.450)
pH, Arterial: 7.475 — ABNORMAL HIGH (ref 7.350–7.450)
pH, Arterial: 7.489 — ABNORMAL HIGH (ref 7.350–7.450)
pH, Arterial: 7.504 — ABNORMAL HIGH (ref 7.350–7.450)
pH, Arterial: 7.559 — ABNORMAL HIGH (ref 7.350–7.450)
pH, Arterial: 7.611 (ref 7.350–7.450)
pO2, Arterial: 129 mmHg — ABNORMAL HIGH (ref 83.0–108.0)
pO2, Arterial: 134 mmHg — ABNORMAL HIGH (ref 83.0–108.0)
pO2, Arterial: 136 mmHg — ABNORMAL HIGH (ref 83.0–108.0)
pO2, Arterial: 178 mmHg — ABNORMAL HIGH (ref 83.0–108.0)
pO2, Arterial: 34 mmHg — CL (ref 83.0–108.0)
pO2, Arterial: 38 mmHg — CL (ref 83.0–108.0)
pO2, Arterial: 47 mmHg — ABNORMAL LOW (ref 83.0–108.0)
pO2, Arterial: 48 mmHg — ABNORMAL LOW (ref 83.0–108.0)
pO2, Arterial: 505 mmHg — ABNORMAL HIGH (ref 83.0–108.0)
pO2, Arterial: 51 mmHg — ABNORMAL LOW (ref 83.0–108.0)
pO2, Arterial: 529 mmHg — ABNORMAL HIGH (ref 83.0–108.0)
pO2, Arterial: 557 mmHg — ABNORMAL HIGH (ref 83.0–108.0)
pO2, Arterial: 56 mmHg — ABNORMAL LOW (ref 83.0–108.0)

## 2020-05-03 LAB — CBC
HCT: 26.7 % — ABNORMAL LOW (ref 39.0–52.0)
HCT: 28.4 % — ABNORMAL LOW (ref 39.0–52.0)
Hemoglobin: 8.7 g/dL — ABNORMAL LOW (ref 13.0–17.0)
Hemoglobin: 9.5 g/dL — ABNORMAL LOW (ref 13.0–17.0)
MCH: 30.3 pg (ref 26.0–34.0)
MCH: 30.1 pg (ref 26.0–34.0)
MCHC: 32.6 g/dL (ref 30.0–36.0)
MCHC: 33.5 g/dL (ref 30.0–36.0)
MCV: 93 fL (ref 80.0–100.0)
MCV: 89.9 fL (ref 80.0–100.0)
Platelets: 232 10*3/uL (ref 150–400)
Platelets: 225 10*3/uL (ref 150–400)
RBC: 2.87 MIL/uL — ABNORMAL LOW (ref 4.22–5.81)
RBC: 3.16 MIL/uL — ABNORMAL LOW (ref 4.22–5.81)
RDW: 16.5 % — ABNORMAL HIGH (ref 11.5–15.5)
RDW: 16.6 % — ABNORMAL HIGH (ref 11.5–15.5)
WBC: 8 10*3/uL (ref 4.0–10.5)
WBC: 8.7 10*3/uL (ref 4.0–10.5)
nRBC: 0 % (ref 0.0–0.2)
nRBC: 0 % (ref 0.0–0.2)

## 2020-05-03 LAB — BASIC METABOLIC PANEL
Anion gap: 9 (ref 5–15)
Anion gap: 9 (ref 5–15)
BUN: 32 mg/dL — ABNORMAL HIGH (ref 6–20)
BUN: 33 mg/dL — ABNORMAL HIGH (ref 6–20)
CO2: 29 mmol/L (ref 22–32)
CO2: 27 mmol/L (ref 22–32)
Calcium: 8.7 mg/dL — ABNORMAL LOW (ref 8.9–10.3)
Calcium: 8.7 mg/dL — ABNORMAL LOW (ref 8.9–10.3)
Chloride: 95 mmol/L — ABNORMAL LOW (ref 98–111)
Chloride: 97 mmol/L — ABNORMAL LOW (ref 98–111)
Creatinine, Ser: 0.81 mg/dL (ref 0.61–1.24)
Creatinine, Ser: 0.81 mg/dL (ref 0.61–1.24)
GFR, Estimated: >60 mL/min (ref >60)
GFR, Estimated: >60 mL/min (ref >60)
Glucose, Bld: 235 mg/dL — ABNORMAL HIGH (ref 70–99)
Glucose, Bld: 203 mg/dL — ABNORMAL HIGH (ref 70–99)
Potassium: 3.5 mmol/L (ref 3.5–5.1)
Potassium: 3.8 mmol/L (ref 3.5–5.1)
Sodium: 133 mmol/L — ABNORMAL LOW (ref 135–145)
Sodium: 133 mmol/L — ABNORMAL LOW (ref 135–145)

## 2020-05-03 LAB — LACTIC ACID, PLASMA
Lactic Acid, Venous: 1.3 mmol/L (ref 0.5–1.9)
Lactic Acid, Venous: 1.8 mmol/L (ref 0.5–1.9)

## 2020-05-03 LAB — APTT
aPTT: 59 seconds — ABNORMAL HIGH (ref 24–36)
aPTT: 62 seconds — ABNORMAL HIGH (ref 24–36)

## 2020-05-03 LAB — LACTATE DEHYDROGENASE: LDH: 384 U/L — ABNORMAL HIGH (ref 98–192)

## 2020-05-03 LAB — GLUCOSE, CAPILLARY
Glucose-Capillary: 192 mg/dL — ABNORMAL HIGH (ref 70–99)
Glucose-Capillary: 204 mg/dL — ABNORMAL HIGH (ref 70–99)
Glucose-Capillary: 205 mg/dL — ABNORMAL HIGH (ref 70–99)
Glucose-Capillary: 206 mg/dL — ABNORMAL HIGH (ref 70–99)
Glucose-Capillary: 209 mg/dL — ABNORMAL HIGH (ref 70–99)
Glucose-Capillary: 220 mg/dL — ABNORMAL HIGH (ref 70–99)
Glucose-Capillary: 248 mg/dL — ABNORMAL HIGH (ref 70–99)
Glucose-Capillary: 305 mg/dL — ABNORMAL HIGH (ref 70–99)

## 2020-05-03 LAB — FIBRINOGEN: Fibrinogen: 324 mg/dL (ref 210–475)

## 2020-05-03 LAB — PROTIME-INR
INR: 1.5 — ABNORMAL HIGH (ref 0.8–1.2)
Prothrombin Time: 17.1 seconds — ABNORMAL HIGH (ref 11.4–15.2)

## 2020-05-03 LAB — PREPARE RBC (CROSSMATCH)

## 2020-05-03 MED ORDER — MIDAZOLAM HCL 2 MG/2ML IJ SOLN
INTRAMUSCULAR | Status: AC
  Administered 2020-05-03: 2 mg
  Filled 2020-05-03: qty 4

## 2020-05-03 MED ORDER — HEPARIN SODIUM (PORCINE) 5000 UNIT/ML IJ SOLN
INTRAMUSCULAR | Status: AC
  Administered 2020-05-03: 5000 [IU]
  Filled 2020-05-03: qty 1

## 2020-05-03 MED ORDER — POTASSIUM CHLORIDE 20 MEQ PO PACK
40.0000 meq | PACK | Freq: Once | ORAL | Status: AC
  Administered 2020-05-03: 40 meq
  Filled 2020-05-03: qty 2

## 2020-05-03 MED ORDER — DEXAMETHASONE SODIUM PHOSPHATE 10 MG/ML IJ SOLN
20.0000 mg | Freq: Once | INTRAMUSCULAR | Status: AC | PRN
  Administered 2020-05-03: 20 mg via INTRAVENOUS
  Filled 2020-05-03: qty 2

## 2020-05-03 MED ORDER — INSULIN DETEMIR 100 UNIT/ML ~~LOC~~ SOLN
25.0000 [IU] | Freq: Every day | SUBCUTANEOUS | Status: DC
  Administered 2020-05-03 – 2020-05-08 (×5): 25 [IU] via SUBCUTANEOUS
  Filled 2020-05-03 (×8): qty 0.25

## 2020-05-03 MED ORDER — SODIUM CHLORIDE 0.9% IV SOLUTION: Freq: Once | INTRAVENOUS | Status: DC

## 2020-05-03 MED ORDER — ETOMIDATE 2 MG/ML IV SOLN
20.0000 mg | Freq: Once | INTRAVENOUS | Status: AC
  Administered 2020-05-03: 20 mg via INTRAVENOUS
  Filled 2020-05-03: qty 10

## 2020-05-03 MED ORDER — MIDAZOLAM HCL 2 MG/2ML IJ SOLN
INTRAMUSCULAR | Status: AC
  Administered 2020-05-03: 8 mg
  Filled 2020-05-03: qty 8

## 2020-05-03 NOTE — Progress Notes (Signed)
ANTICOAGULATION CONSULT NOTE  Pharmacy Consult for bivalirudin Indication: ECMO and VTE  Labs: Recent Labs    05/01/20 0406 05/01/20 0415 05/02/20 0353 05/02/20 0355 05/02/20 1616 05/02/20 1623 05/03/20 0355 05/03/20 0646 05/03/20 1509 05/03/20 1512 05/03/20 1519  HGB 8.0*   < > 9.2*   < > 9.1*   < > 8.7*   < > 9.2* 9.5* 8.8*  HCT 25.5*   < > 27.0*   < > 27.9*   < > 26.7*   < > 27.0* 28.4* 26.0*  PLT 216   < > 227  --  231  --  232  --   --  225  --   APTT 60*   < > 65*  --  59*  --  59*  --   --  62*  --   LABPROT 16.7*  --  16.8*  --   --   --  17.1*  --   --   --   --   INR 1.4*  --  1.4*  --   --   --  1.5*  --   --   --   --   CREATININE 0.68   < > 0.67  --  0.61  --  0.81  --   --  0.81  --    < > = values in this interval not displayed.    Assessment: 64 yoM admitted with COVID-19 PNA with worsening hypoxia, s/p cannulation for ECMO. Pt was started on IV heparin prior to cannulation due to acute DVTs and possible PE, transitioned to bivalirudin with ECMO. Now s/p tPA on 1/5 and tracheostomy on 1/6.   Circuit changed on 05/03/2020 - 5000 units of heparin given during procedure at  ~1000. aPTT 62 on bivalirudin 0.087mg /kg/hr is therapeutic. Hgb 8.8. Plt 225.  Goal of Therapy:  aPTT 60-80 seconds   Plan:  -Continue bivalirudin to 0.087 mg/kg/hr (using order-specific wt 72.1kg) -Monitor q12h aPTT/CBC, LDH, and for s/sx of bleeding  Gerrit Halls, PharmD Clinical Pharmacist

## 2020-05-03 NOTE — Progress Notes (Signed)
  Echocardiogram Echocardiogram Transesophageal has been performed.  Alex [name] 05/03/2020, 3:10 PM

## 2020-05-03 NOTE — Procedures (Signed)
Extracorporeal support note  ECLS cannulation date: 03/02/2020 Last circuit change: 04/07/2020  Indication: Acute hypoxic respiratory failure due to ARDS from COVID-19 pneumonia with RV dysfunction.   Configuration: Venovenous  Drainage cannula: 32 French crescent cannula via right IJ Return cannula: Same  Pump speed: 3500 RPM Pump flow: 4.59 L/min Pump used: Cardio help  Oxygenator: Cardio help O2 blender: 100% Sweep gas: 8 L  Circuit check: not seeing any clots at all, IJ catheter with some oozing Anticoagulant: Bivalirudin Anticoagulation targets: PTT 60-80  Changes in support:  - Circuit change due to dropping flows, statin/steroids/higher PTT to reduce post change inflammation - If still having variable SvO2 after circuit change, tentative plan for TEE and catheter position check this afternoon  Anticipated goals/duration of support: Bridge to recovery   Multidisciplinary ECMO rounds completed.  Lorin Glass, MD 05/03/20 8:59 AM [facility]

## 2020-05-03 NOTE — H&P (View-Only) (Signed)
Patient ID: Alex [name], male   DOB: 07-07-1970, 50 y.o.   MRN: [id number]     Advanced Heart Failure Rounding Note  PCP-Cardiologist: No primary care provider on file.   Subjective:    - 12/30: VV ECMO cannulation - 12/31: Left chest tube replaced - 1/2: Extubated. Echo with EF 60-65%, mildly dilated RV with mildly decreased systolic function.  - 1/4: Agitated, suspected aspiration.  Re-intubated.  - 1/5: ECMO cannula repositioned under TEE guidance. TEE showed moderately dilated/moderate-severely dysfunctional RV in setting of hypoxemia. LUE DVT found.  Patient got 1/2 dose of TPA due to initial concern for large PE.  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  - 1/6: Tracheostomy - 1/7: Echo with mild RV dilation/mild RV dysfunction.  - 1/16: Left chest tube out - 1/17: LUE arterial dopplers repeated, showed no obstruction.  - 1/20: Echo with EF 65-70%, mildly D-shaped septum, mildly dilated and mildly dysfunctional RV.  - 1/22: CTA chest: Bilateral upper lobe PEs (suspect chronic), changes c/w ARDS - 1/23: Bronchoscopy showed semi-occlusive ?mass/polyp in the trachea.  - 1/24: Bronchoscopy showed resolution of 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 - 1/26: ECMO circuit changed.  - 2/1: CT chest showed diffuse bronchiectasis as well as diffuse opacity consistent with COVID-19 PNA with ARDS. - 2/3: Trach exchange - 2/5: Decannulated to HFNC - 2/6: Enterococcal PNA/bacteremia.  Echo showed EF 65-70%, normal-appearing RV, no vegetation noted.  - 2/7: TEE with no vegetation, EF 55-60%, RV low normal function with normal size.  - 2/16: Right thoracentesis 500 cc  On 15 L HFNC this morning.  Pre- and post-oxygenator gases this morning show good change in oxygenation but minimal change in PaCO2.  Sweep up to 8 this morning.   He is sitting in chair watching a movie.   ECMO parameters: 3500 rpm Flow 4.55 L/min Pvenous -78 Delta P 33 Sweep 8 HFNC 15 L  ABG 7.36/54/56/87% LDH  384 PTT 59 Lactate 1.3 Hgb 8.7  Objective:   Weight Range: 77.9 kg Body mass index is 29.48 kg/m.   Vital Signs:   Temp:  [98.1 F (36.7 C)-100 F (37.8 C)] 98.8 F (37.1 C) (02/21 0600) Pulse Rate:  [80-109] 99 (02/21 0600) Resp:  [3-39] 20 (02/21 0600) BP: (90-143)/(53-98) 139/91 (02/21 0600) SpO2:  [89 %-100 %] 98 % (02/21 0845) Arterial Line BP: (89-168)/(47-85) 168/81 (02/21 0600) Weight:  [77.9 kg] 77.9 kg (02/21 0600) Last BM Date: 05/01/20  Weight change: Filed Weights   05/01/20 1900 05/02/20 0500 05/03/20 0600  Weight: 75.4 kg 72.7 kg 77.9 kg    Intake/Output:   Intake/Output Summary (Last 24 hours) at 05/03/2020 0906 Last data filed at 05/03/2020 0600 Gross per 24 hour  Intake 1854.45 ml  Output 2600 ml  Net -745.55 ml      Physical Exam    General: NAD Neck: No JVD, no thyromegaly or thyroid nodule.  Lungs: Clear to auscultation bilaterally with normal respiratory effort. CV: Nondisplaced PMI.  Heart regular S1/S2, no S3/S4, no murmur.  No peripheral edema.  Abdomen: Soft, nontender, no hepatosplenomegaly, no distention.  Skin: Intact without lesions or rashes.  Neurologic: Alert and oriented x 3.  Psych: Normal affect. Extremities: Dry gangrene left digits  HEENT: Normal.    Telemetry   NSR 100s. Personally reviewed   Labs    CBC Recent Labs    05/02/20 1616 05/02/20 1623 05/03/20 0355 05/03/20 0646 05/03/20 0735 05/03/20 0743  WBC 8.9  --  8.0  --   --   --  HGB 9.1*   < > 8.7*   < > 8.5* 8.8*  HCT 27.9*   < > 26.7*   < > 25.0* 26.0*  MCV 91.8  --  93.0  --   --   --   PLT 231  --  232  --   --   --    < > = values in this interval not displayed.   Basic Metabolic Panel Recent Labs    47/42/59 1616 05/02/20 1623 05/03/20 0355 05/03/20 0646 05/03/20 0735 05/03/20 0743  NA 132*   < > 133*   < > 133* 133*  K 3.8   < > 3.5   < > 4.0 4.1  CL 94*  --  95*  --   --   --   CO2 29  --  29  --   --   --   GLUCOSE 197*  --   235*  --   --   --   BUN 28*  --  32*  --   --   --   CREATININE 0.61  --  0.81  --   --   --   CALCIUM 8.9  --  8.7*  --   --   --    < > = values in this interval not displayed.   Liver Function Tests No results for input(s): AST, ALT, ALKPHOS, BILITOT, PROT, ALBUMIN in the last 72 hours. No results for input(s): LIPASE, AMYLASE in the last 72 hours. Cardiac Enzymes No results for input(s): CKTOTAL, CKMB, CKMBINDEX, TROPONINI in the last 72 hours.  BNP: BNP (last 3 results) No results for input(s): BNP in the last 8760 hours.  ProBNP (last 3 results) No results for input(s): PROBNP in the last 8760 hours.   D-Dimer No results for input(s): DDIMER in the last 72 hours. Hemoglobin A1C No results for input(s): HGBA1C in the last 72 hours. Fasting Lipid Panel No results for input(s): CHOL, HDL, LDLCALC, TRIG, CHOLHDL, LDLDIRECT in the last 72 hours. Thyroid Function Tests No results for input(s): TSH, T4TOTAL, T3FREE, THYROIDAB in the last 72 hours.  Invalid input(s): FREET3  Other results:   Imaging    DG CHEST PORT 1 VIEW  Result Date: 05/03/2020 CLINICAL DATA:  Respiratory failure and ECMO. EXAM: PORTABLE CHEST 1 VIEW COMPARISON:  05/02/2020 FINDINGS: Again noted is an ECMO cannula in the right neck that extends into the upper abdomen. Dense opacities throughout both lungs with central air bronchograms. Cardiac silhouette cannot be well identified due to the diffuse lung densities. Negative for pneumothorax. Feeding tube extends into the abdomen but the tip is beyond the image. Left arm PICC line tip has apparently flipped up into the right innominate rain. IMPRESSION: 1. No change in the bilateral lung opacities. Findings are compatible with bilateral lung consolidation. 2. PICC line tip has flipped up into the right innominate vein. Electronically Signed   By: Richarda Overlie M.D.   On: 05/03/2020 07:59     Medications:     Scheduled Medications: . acetaZOLAMIDE  250 mg  Per Tube BID  . bethanechol  10 mg Per Tube TID  . carvedilol  25 mg Per Tube BID WC  . chlorhexidine  15 mL Mouth Rinse BID  . Chlorhexidine Gluconate Cloth  6 each Topical Daily  . chlorpheniramine-HYDROcodone  5 mL Per Tube Q12H  . clonazePAM  1 mg Oral BID  . dextromethorphan  30 mg Per Tube TID  . diltiazem  30  mg Oral Q12H  . docusate  100 mg Per Tube BID  . doxazosin  2 mg Oral Daily  . feeding supplement  237 mL Oral TID BM  . feeding supplement (PIVOT 1.5 CAL)  1,000 mL Per Tube Q24H  . fiber  1 packet Per Tube BID  . furosemide  40 mg Intravenous BID  . gabapentin  300 mg Per Tube Q8H  . insulin aspart  0-20 Units Subcutaneous Q4H  . insulin aspart  4 Units Subcutaneous 3 times per day  . insulin detemir  25 Units Subcutaneous Daily  . insulin detemir  40 Units Subcutaneous QHS  . ipratropium-albuterol  3 mL Nebulization BID  . liver oil-zinc oxide   Topical 5 X Daily  . mouth rinse  15 mL Mouth Rinse q12n4p  . melatonin  5 mg Oral QHS  . metoCLOPramide (REGLAN) injection  10 mg Intravenous Q8H  . pantoprazole sodium  40 mg Per Tube QHS  . QUEtiapine  50 mg Oral QHS  . rosuvastatin  40 mg Oral Daily  . sennosides  10 mL Per Tube QHS  . sertraline  25 mg Oral Daily  . sodium chloride flush  10-40 mL Intracatheter Q12H    Infusions: . sodium chloride    . sodium chloride Stopped (04/29/20 1611)  . albumin human Stopped (04/23/20 0041)  . bivalirudin (ANGIOMAX) infusion 0.5 mg/mL (Non-ACS indications) 0.087 mg/kg/hr (05/03/20 0600)  . vancomycin 1,000 mg (05/03/20 0823)    PRN Medications: Place/Maintain arterial line **AND** sodium chloride, acetaminophen (TYLENOL) oral liquid 160 mg/5 mL, albumin human, [DISCONTINUED] lidocaine **AND** albuterol, clonazePAM, dexamethasone (DECADRON) injection, Gerhardt's butt cream, guaiFENesin, hydrALAZINE, labetalol, lip balm, ondansetron (ZOFRAN) IV, oxyCODONE, phenol, polyethylene glycol, promethazine-codeine, simethicone,  sodium chloride, sodium chloride flush   Assessment/Plan   1. Acute hypoxemic respiratory failure: Due to COVID-19 PNA with bilateral infiltrates.  Refractory hypoxemia, VV-ECMO cannulation on 03/01/2020 with improvement in oxygenation.  Developed left PTX post-subclavian CVL and had left chest tube, the left lung is re-expanded and CT out.  He was extubated 1/2 but reintubated 1/4 with agitation and suspected aspiration.  Tracheostomy 1/6.  CTA chest 1/22 with suspected chronic PEs and ARDS. ECMO cannula repositioned 1/5. ECMO circuit changed 1/26. Extubated to HFNC on 2/5. LDH stable. Sweep at 4, have struggled with hypercarbia from significant dead space ventilation.  Restarted abx 2/5 with sepsis, Enterococcus faecalis, now on vancomycin.  I/Os even to negative with IV Lasix + acetazolamide.  - Pre and post-oxygenator gases with minimal change in PaCO2 but expected change in PaO2, sweep up to 8.  Discussed with team, will change circuit today.  - Continue acetazolamide 250 mg bid and Lasix 40 mg IV bid today.  - On vancomycin with Enterococcus.  Today should be the last day.  - Patient has had remdesivir, tocilizumab. - Completed steroid taper. - Continue bivalirudin, goal PTT 65-80.  PTT 59. Discussed dosing with PharmD personally. - Continue to mobilize. - Coreg + low dose diltiazem continues at 25 mg bid to increase fractional flow via ECMO circuit.  2. 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  Clot noted on TEE in RV as well.  TTE 1/2 showed normal EF 60-65%, RV improved (mildly dilated/dysfunctional). TEE on 1/5 with moderate to severe RV dysfunction but patient was hypoxemic.  Had 1/2 dose TPA on 1/5. Echo 1/20 with mildly dilated/mildly dysfunctional RV. CTA chest 1/22 with chronic-appearing PEs in upper lobes. - PTT 64.   Bivalirudin for goal PTT 65-80. Discussed dosing with PharmD personally. 3. Left PTX: Left chest tube, lung  is re-expanded. Tube now out, stable CXR. 4. Shock: Suspect septic/distributive.  Now resolved, off NE.  5. Anemia: Hgb 8.7, transfuse < 7.5.  6. AKI: Resolved 7. Hyperglycemia: insulin.  8. HTN: Controlled.     - Clonidine 0.1 bid. - Continue Coreg 25 mg bid.   - Diltiazem 30 bid.  9. CHB: Episode of CHB when hypoxemic and with cough (suspect vagal).  NSR since then.   He has occasional short vagal episodes.  - Continue Coreg/diltiazem, watch rhythm.  10. Thrombocytopenia: Resolved  11. FEN: Continue tube feeds.  12. Ischemic digits: LUE.  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  Repeat study 1/17 showed no obstruction.  - Wound Care following. 13. ID: Group F Strep and Enterococcus faecalis in sputum. Initially completed abx for these bacteria. Now with recurrent sepsis, Enterococcus faecalis in blood now. Vancomycin restarted. TEE 2/7 with no vegetation.  - Continue vancomycin to 2/21 (stop after today).   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   - resolved 15. Hypernatremia: Resolved. Continue free water. No change 16. Depression: Started sertraline 25 mg daily.   CRITICAL CARE Performed by: Marca Ancona  Total critical care time: 35 minutes  Critical care time was exclusive of separately billable procedures and treating other patients.  Critical care was necessary to treat or prevent imminent or life-threatening deterioration.  Critical care was time spent personally by me on the following activities: development of treatment plan with patient and/or surrogate as well as nursing, discussions with consultants, evaluation of patient's response to treatment, examination of patient, obtaining history from patient or surrogate, ordering and performing treatments and interventions, ordering and review of laboratory studies, ordering and review of radiographic  studies, pulse oximetry and re-evaluation of patient's condition.    Length of Stay: 78  Marca Ancona, MD  05/03/2020, 9:06 AM  Advanced Heart Failure Team Pager [phone] (M-F; 7a - 4p)  Please contact [facility] for night-coverage after hours (4p -7a ) and weekends on [email or web address]

## 2020-05-03 NOTE — CV Procedure (Signed)
Procedure: TEE  Sedation: Per CCM  Indication: ECMO cannula position  Findings: Please see echo section for full report.  Normal LV size and systolic function, EF 60-65%.  Normal wall motion.  Trivial mitral regurgitation.  Trileaflet aortic valve with no stenosis or regurgitation.  Normal left atrium, no LA appendage thrombus.  Mildly dilated right atrium.  Normal RV size and systolic function.  Minimal TR.    The ECMO catheter appeared to be deep, was withdrawn about 2 cm by Dr. Vickey Sages.  Flow was then directed towards the tricuspid valve.   Alex [name] 05/03/2020 2:50 PM

## 2020-05-03 NOTE — Interval H&P Note (Signed)
History and Physical Interval Note:  05/03/2020 2:46 PM  Alex [name]  has presented today for surgery, with the diagnosis of covid.  The various methods of treatment have been discussed with the patient and family. After consideration of risks, benefits and other options for treatment, the patient has consented to  Procedure(s): ECMO CANNULATION (N/A) CENTRAL LINE INSERTION TRANSESOPHAGEAL ECHOCARDIOGRAM (TEE) as a surgical intervention.  The patient's history has been reviewed, patient examined, no change in status, stable for surgery.  I have reviewed the patient's chart and labs.  Questions were answered to the patient's satisfaction.     Ayat Drenning [employer]

## 2020-05-03 NOTE — Progress Notes (Signed)
Patient placed on NRB mask at 15L during the ECMO circuit change.

## 2020-05-03 NOTE — Progress Notes (Signed)
Physical Therapy Treatment Patient Details Name: Alex [name] MRN: [id number] DOB: 09-Aug-1970 Today's Date: 05/03/2020    History of Present Illness Pt is 50 y.o. male  with no significant past medical history admitted on 02/17/2020 with dyspnea, cough, nausea/vomiting ~ 1 week ago with worsening symptoms of body aches and fatigue and +COVID 02/07/20 and admitted with shortness of breath. Required intubation 03/10/20. Cannulated for VV ECMO March 21, 2020. Oxygenating better with ECMO. Also evidence of RLE DVT, RV thrombus, and high suspicion of PE. Has required chest tube to L lung for collapse which needs further reposition with recurrent collapse. Extubated 03-15-19.  Reintubated 1/5 and trach placed 1/6. Decannulated 04/16/20.    PT Comments    Pt with 2 procedures today requiring sedating medications, asleep on entry but able to rouse easily. PT took opportunity of pt being in bed to teach supine bridging and transverse abdominus activation to improve pelvic rotation and core strengthening for carry over to gait training. PT will follow back tomorrow for continued gait training.    Follow Up Recommendations  CIR;Supervision/Assistance - 24 hour     Equipment Recommendations  Other (comment) (TBD (pt still on ECMO))       Precautions / Restrictions Precautions Precautions: Fall Precaution Comments: ECMO, NGtube, Foley, Pt decannulated himself 2/4 Restrictions Other Position/Activity Restrictions: L platform walker used for mobility due to discomfort with grip to L hand          Cognition Arousal/Alertness: Awake/alert Behavior During Therapy: Flat affect Overall Cognitive Status: Within Functional Limits for tasks assessed                                 General Comments: somewhat lethargic from medication for procedure this morning.      Exercises General Exercises - Lower Extremity Hip ABduction/ADduction: Seated;Strengthening;AROM;5 reps (light orange resistance  band) Low Level/ICU Exercises Stabilized Bridging: Supine;10 reps;AROM Other Exercises Other Exercises: transverse abdominus activation, for progressing core strength, x10    General Comments General comments (skin integrity, edema, etc.): Pt with procedure this morning and one scheduled this afternoon, both with sedating medicine.      Pertinent Vitals/Pain Pain Assessment: No/denies pain           PT Goals (current goals can now be found in the care plan section) Acute Rehab PT Goals Patient Stated Goal: continue to walk PT Goal Formulation: With patient Time For Goal Achievement: 05/10/20 Potential to Achieve Goals: Fair Progress towards PT goals: Progressing toward goals    Frequency    Min 5X/week      PT Plan Current plan remains appropriate       AM-PAC PT "6 Clicks" Mobility   Outcome Measure  Help needed turning from your back to your side while in a flat bed without using bedrails?: A Lot Help needed moving from lying on your back to sitting on the side of a flat bed without using bedrails?: A Lot Help needed moving to and from a bed to a chair (including a wheelchair)?: A Lot Help needed standing up from a chair using your arms (e.g., wheelchair or bedside chair)?: A Lot Help needed to walk in hospital room?: A Lot Help needed climbing 3-5 steps with a railing? : Total 6 Click Score: 11    End of Session   Activity Tolerance: Patient tolerated treatment well Patient left: with nursing/sitter in room;in bed Nurse Communication: Mobility status;Other (comment) (scheduled approx 11:30 for  daily therapy) PT Visit Diagnosis: Muscle weakness (generalized) (M62.81);Difficulty in walking, not elsewhere classified (R26.2);Other abnormalities of gait and mobility (R26.89);Unsteadiness on feet (R26.81)     Time: 2831-5176 PT Time Calculation (min) (ACUTE ONLY): 11 min  Charges:  $Therapeutic Exercise: 8-22 mins                     Alyssa Rotondo B. Beverely Risen  PT, DPT Acute Rehabilitation Services Pager [phone] Office [phone]    Elon Alas Fleet 05/03/2020, 1:50 PM

## 2020-05-03 NOTE — Progress Notes (Signed)
Patient ID: Alex [name], male   DOB: 07-07-1970, 50 y.o.   MRN: [id number]     Advanced Heart Failure Rounding Note  PCP-Cardiologist: No primary care provider on file.   Subjective:    - 12/30: VV ECMO cannulation - 12/31: Left chest tube replaced - 1/2: Extubated. Echo with EF 60-65%, mildly dilated RV with mildly decreased systolic function.  - 1/4: Agitated, suspected aspiration.  Re-intubated.  - 1/5: ECMO cannula repositioned under TEE guidance. TEE showed moderately dilated/moderate-severely dysfunctional RV in setting of hypoxemia. LUE DVT found.  Patient got 1/2 dose of TPA due to initial concern for large PE.  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  - 1/6: Tracheostomy - 1/7: Echo with mild RV dilation/mild RV dysfunction.  - 1/16: Left chest tube out - 1/17: LUE arterial dopplers repeated, showed no obstruction.  - 1/20: Echo with EF 65-70%, mildly D-shaped septum, mildly dilated and mildly dysfunctional RV.  - 1/22: CTA chest: Bilateral upper lobe PEs (suspect chronic), changes c/w ARDS - 1/23: Bronchoscopy showed semi-occlusive ?mass/polyp in the trachea.  - 1/24: Bronchoscopy showed resolution of 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 - 1/26: ECMO circuit changed.  - 2/1: CT chest showed diffuse bronchiectasis as well as diffuse opacity consistent with COVID-19 PNA with ARDS. - 2/3: Trach exchange - 2/5: Decannulated to HFNC - 2/6: Enterococcal PNA/bacteremia.  Echo showed EF 65-70%, normal-appearing RV, no vegetation noted.  - 2/7: TEE with no vegetation, EF 55-60%, RV low normal function with normal size.  - 2/16: Right thoracentesis 500 cc  On 15 L HFNC this morning.  Pre- and post-oxygenator gases this morning show good change in oxygenation but minimal change in PaCO2.  Sweep up to 8 this morning.   He is sitting in chair watching a movie.   ECMO parameters: 3500 rpm Flow 4.55 L/min Pvenous -78 Delta P 33 Sweep 8 HFNC 15 L  ABG 7.36/54/56/87% LDH  384 PTT 59 Lactate 1.3 Hgb 8.7  Objective:   Weight Range: 77.9 kg Body mass index is 29.48 kg/m.   Vital Signs:   Temp:  [98.1 F (36.7 C)-100 F (37.8 C)] 98.8 F (37.1 C) (02/21 0600) Pulse Rate:  [80-109] 99 (02/21 0600) Resp:  [3-39] 20 (02/21 0600) BP: (90-143)/(53-98) 139/91 (02/21 0600) SpO2:  [89 %-100 %] 98 % (02/21 0845) Arterial Line BP: (89-168)/(47-85) 168/81 (02/21 0600) Weight:  [77.9 kg] 77.9 kg (02/21 0600) Last BM Date: 05/01/20  Weight change: Filed Weights   05/01/20 1900 05/02/20 0500 05/03/20 0600  Weight: 75.4 kg 72.7 kg 77.9 kg    Intake/Output:   Intake/Output Summary (Last 24 hours) at 05/03/2020 0906 Last data filed at 05/03/2020 0600 Gross per 24 hour  Intake 1854.45 ml  Output 2600 ml  Net -745.55 ml      Physical Exam    General: NAD Neck: No JVD, no thyromegaly or thyroid nodule.  Lungs: Clear to auscultation bilaterally with normal respiratory effort. CV: Nondisplaced PMI.  Heart regular S1/S2, no S3/S4, no murmur.  No peripheral edema.  Abdomen: Soft, nontender, no hepatosplenomegaly, no distention.  Skin: Intact without lesions or rashes.  Neurologic: Alert and oriented x 3.  Psych: Normal affect. Extremities: Dry gangrene left digits  HEENT: Normal.    Telemetry   NSR 100s. Personally reviewed   Labs    CBC Recent Labs    05/02/20 1616 05/02/20 1623 05/03/20 0355 05/03/20 0646 05/03/20 0735 05/03/20 0743  WBC 8.9  --  8.0  --   --   --  HGB 9.1*   < > 8.7*   < > 8.5* 8.8*  HCT 27.9*   < > 26.7*   < > 25.0* 26.0*  MCV 91.8  --  93.0  --   --   --   PLT 231  --  232  --   --   --    < > = values in this interval not displayed.   Basic Metabolic Panel Recent Labs    47/42/59 1616 05/02/20 1623 05/03/20 0355 05/03/20 0646 05/03/20 0735 05/03/20 0743  NA 132*   < > 133*   < > 133* 133*  K 3.8   < > 3.5   < > 4.0 4.1  CL 94*  --  95*  --   --   --   CO2 29  --  29  --   --   --   GLUCOSE 197*  --   235*  --   --   --   BUN 28*  --  32*  --   --   --   CREATININE 0.61  --  0.81  --   --   --   CALCIUM 8.9  --  8.7*  --   --   --    < > = values in this interval not displayed.   Liver Function Tests No results for input(s): AST, ALT, ALKPHOS, BILITOT, PROT, ALBUMIN in the last 72 hours. No results for input(s): LIPASE, AMYLASE in the last 72 hours. Cardiac Enzymes No results for input(s): CKTOTAL, CKMB, CKMBINDEX, TROPONINI in the last 72 hours.  BNP: BNP (last 3 results) No results for input(s): BNP in the last 8760 hours.  ProBNP (last 3 results) No results for input(s): PROBNP in the last 8760 hours.   D-Dimer No results for input(s): DDIMER in the last 72 hours. Hemoglobin A1C No results for input(s): HGBA1C in the last 72 hours. Fasting Lipid Panel No results for input(s): CHOL, HDL, LDLCALC, TRIG, CHOLHDL, LDLDIRECT in the last 72 hours. Thyroid Function Tests No results for input(s): TSH, T4TOTAL, T3FREE, THYROIDAB in the last 72 hours.  Invalid input(s): FREET3  Other results:   Imaging    DG CHEST PORT 1 VIEW  Result Date: 05/03/2020 CLINICAL DATA:  Respiratory failure and ECMO. EXAM: PORTABLE CHEST 1 VIEW COMPARISON:  05/02/2020 FINDINGS: Again noted is an ECMO cannula in the right neck that extends into the upper abdomen. Dense opacities throughout both lungs with central air bronchograms. Cardiac silhouette cannot be well identified due to the diffuse lung densities. Negative for pneumothorax. Feeding tube extends into the abdomen but the tip is beyond the image. Left arm PICC line tip has apparently flipped up into the right innominate rain. IMPRESSION: 1. No change in the bilateral lung opacities. Findings are compatible with bilateral lung consolidation. 2. PICC line tip has flipped up into the right innominate vein. Electronically Signed   By: Richarda Overlie M.D.   On: 05/03/2020 07:59     Medications:     Scheduled Medications: . acetaZOLAMIDE  250 mg  Per Tube BID  . bethanechol  10 mg Per Tube TID  . carvedilol  25 mg Per Tube BID WC  . chlorhexidine  15 mL Mouth Rinse BID  . Chlorhexidine Gluconate Cloth  6 each Topical Daily  . chlorpheniramine-HYDROcodone  5 mL Per Tube Q12H  . clonazePAM  1 mg Oral BID  . dextromethorphan  30 mg Per Tube TID  . diltiazem  30  mg Oral Q12H  . docusate  100 mg Per Tube BID  . doxazosin  2 mg Oral Daily  . feeding supplement  237 mL Oral TID BM  . feeding supplement (PIVOT 1.5 CAL)  1,000 mL Per Tube Q24H  . fiber  1 packet Per Tube BID  . furosemide  40 mg Intravenous BID  . gabapentin  300 mg Per Tube Q8H  . insulin aspart  0-20 Units Subcutaneous Q4H  . insulin aspart  4 Units Subcutaneous 3 times per day  . insulin detemir  25 Units Subcutaneous Daily  . insulin detemir  40 Units Subcutaneous QHS  . ipratropium-albuterol  3 mL Nebulization BID  . liver oil-zinc oxide   Topical 5 X Daily  . mouth rinse  15 mL Mouth Rinse q12n4p  . melatonin  5 mg Oral QHS  . metoCLOPramide (REGLAN) injection  10 mg Intravenous Q8H  . pantoprazole sodium  40 mg Per Tube QHS  . QUEtiapine  50 mg Oral QHS  . rosuvastatin  40 mg Oral Daily  . sennosides  10 mL Per Tube QHS  . sertraline  25 mg Oral Daily  . sodium chloride flush  10-40 mL Intracatheter Q12H    Infusions: . sodium chloride    . sodium chloride Stopped (04/29/20 1611)  . albumin human Stopped (04/23/20 0041)  . bivalirudin (ANGIOMAX) infusion 0.5 mg/mL (Non-ACS indications) 0.087 mg/kg/hr (05/03/20 0600)  . vancomycin 1,000 mg (05/03/20 0823)    PRN Medications: Place/Maintain arterial line **AND** sodium chloride, acetaminophen (TYLENOL) oral liquid 160 mg/5 mL, albumin human, [DISCONTINUED] lidocaine **AND** albuterol, clonazePAM, dexamethasone (DECADRON) injection, Gerhardt's butt cream, guaiFENesin, hydrALAZINE, labetalol, lip balm, ondansetron (ZOFRAN) IV, oxyCODONE, phenol, polyethylene glycol, promethazine-codeine, simethicone,  sodium chloride, sodium chloride flush   Assessment/Plan   1. Acute hypoxemic respiratory failure: Due to COVID-19 PNA with bilateral infiltrates.  Refractory hypoxemia, VV-ECMO cannulation on December 13, 2019 with improvement in oxygenation.  Developed left PTX post-subclavian CVL and had left chest tube, the left lung is re-expanded and CT out.  He was extubated 1/2 but reintubated 1/4 with agitation and suspected aspiration.  Tracheostomy 1/6.  CTA chest 1/22 with suspected chronic PEs and ARDS. ECMO cannula repositioned 1/5. ECMO circuit changed 1/26. Extubated to HFNC on 2/5. LDH stable. Sweep at 4, have struggled with hypercarbia from significant dead space ventilation.  Restarted abx 2/5 with sepsis, Enterococcus faecalis, now on vancomycin.  I/Os even to negative with IV Lasix + acetazolamide.  - Pre and post-oxygenator gases with minimal change in PaCO2 but expected change in PaO2, sweep up to 8.  Discussed with team, will change circuit today.  - Continue acetazolamide 250 mg bid and Lasix 40 mg IV bid today.  - On vancomycin with Enterococcus.  Today should be the last day.  - Patient has had remdesivir, tocilizumab. - Completed steroid taper. - Continue bivalirudin, goal PTT 65-80.  PTT 59. Discussed dosing with PharmD personally. - Continue to mobilize. - Coreg + low dose diltiazem continues at 25 mg bid to increase fractional flow via ECMO circuit.  2. 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  Clot noted on TEE in RV as well.  TTE 1/2 showed normal EF 60-65%, RV improved (mildly dilated/dysfunctional). TEE on 1/5 with moderate to severe RV dysfunction but patient was hypoxemic.  Had 1/2 dose TPA on 1/5. Echo 1/20 with mildly dilated/mildly dysfunctional RV. CTA chest 1/22 with chronic-appearing PEs in upper lobes. - PTT 64.  Bivalirudin for goal PTT 65-80. Discussed dosing with PharmD personally. 3. Left PTX: Left chest tube, lung  is re-expanded. Tube now out, stable CXR. 4. Shock: Suspect septic/distributive.  Now resolved, off NE.  5. Anemia: Hgb 8.7, transfuse < 7.5.  6. AKI: Resolved 7. Hyperglycemia: insulin.  8. HTN: Controlled.     - Clonidine 0.1 bid. - Continue Coreg 25 mg bid.   - Diltiazem 30 bid.  9. CHB: Episode of CHB when hypoxemic and with cough (suspect vagal).  NSR since then.   He has occasional short vagal episodes.  - Continue Coreg/diltiazem, watch rhythm.  10. Thrombocytopenia: Resolved  11. FEN: Continue tube feeds.  12. Ischemic digits: LUE.  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  Repeat study 1/17 showed no obstruction.  - Wound Care following. 13. ID: Group F Strep and Enterococcus faecalis in sputum. Initially completed abx for these bacteria. Now with recurrent sepsis, Enterococcus faecalis in blood now. Vancomycin restarted. TEE 2/7 with no vegetation.  - Continue vancomycin to 2/21 (stop after today).   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   - resolved 15. Hypernatremia: Resolved. Continue free water. No change 16. Depression: Started sertraline 25 mg daily.   CRITICAL CARE Performed by: Marca Ancona  Total critical care time: 35 minutes  Critical care time was exclusive of separately billable procedures and treating other patients.  Critical care was necessary to treat or prevent imminent or life-threatening deterioration.  Critical care was time spent personally by me on the following activities: development of treatment plan with patient and/or surrogate as well as nursing, discussions with consultants, evaluation of patient's response to treatment, examination of patient, obtaining history from patient or surrogate, ordering and performing treatments and interventions, ordering and review of laboratory studies, ordering and review of radiographic  studies, pulse oximetry and re-evaluation of patient's condition.    Length of Stay: 78  Marca Ancona, MD  05/03/2020, 9:06 AM  Advanced Heart Failure Team Pager [phone] (M-F; 7a - 4p)  Please contact [facility] for night-coverage after hours (4p -7a ) and weekends on [email or web address]

## 2020-05-03 NOTE — Progress Notes (Signed)
Critical Care Progress Note  NAME:  Alex [name], MRN:  [id number], DOB:  [birth year], LOS: 55 ADMISSION DATE:  02/19/2020, CONSULTATION DATE: 03-24-20 REFERRING MD: Wynona Neat -LBPCCM, CHIEF COMPLAINT: Respiratory failure requiring ECMO  HPI/course in hospital  50 year old man admitted to hospital 12/28 with 1 week history of dyspnea cough nausea and vomiting.  Initially admitted to [facility] long hospital and placed on high flow nasal cannula but rapidly failed and required intubation 12/29.  Persistent hypoxic respiratory failure with PF ratio 55 in spite of 18 of PEEP FiO2 0.1 despite paralytics.  Did not improve with prone ventilation  Cannulated for VV ECMO 12/30 via right IJ crescent cannula.  ECMO circuit was changed on 04/07/2020 Iatrogenic pneumothorax from left subclavian triple-lumen placement  12/28 admitted covid, ARDS 12/30 ECMO cannulation, iatrogenic pneumothrorax, DVT, Bivalrudin started           Left subclavian hematoma, chest tube, ceftriaxone/ azithromycin started 12/31 1Unit PRBC 1/1 Palliative consult, noted HTN, fentanyl only 1/2 Extubation I/O positive, left lung re-expanded, EF 60-65%, Lasix 20mg  BID 1/3 BiPAP in place, HTN to 190s, 200s, labetolol for BP, I/O even, cefepime and vancomycin started 1/4 agitation off BiPAP, possible aspiration 1/5 agitation overnight, reintubated      ECHO with RV dilation, ECMO cannula repositioned, LUE DVT (+), 1/2 dose tPA given,      I/O up, on lasix, Epi, NE, vasopressin started, HR/ rhythm issues noted 1/6 Tracheostomy, hypoglycemic when off tube feeds       Cortrack tune issues 1/7 on lasix gtt 4mg /hr with diuresis, agitation improved 1/8 starting steroid taper 1/9 severe agitation, heavy sedation required, lasix gtt 6mg /hr, I/Os (-)       precedex and fentanyl 1/10 Vanc stopped 1/11 sweep to 2, lasix gtt at 4mg /hr, weight up, metoprolol added for HTN 1/12 fevers to 99 noted, lasix gtt and metazolone 1/13 lasix gtt  dced 1/14 intermittent HTN, possibly flash pulmonary edema tied to sedation        Ischemic left hand changes, 1 Unit PRBCs 1/15 1/16 sweep from 4 to 2.5, chest tube removed 1/17 sweep at 6, trial of nebulized morphine for cough, LUE dopplers show no obstruction 1/18 two events of 2nd degree type II HB noted with coughing         sweep at 4, IV lasix 40, acetazolamide, distal XLT trach placement         agitation and BP spikes 1/19 agitation, air hunger, seroquel, klonopin, oxy, valproate, ketamine trial 1/20 sweep at 3.5, diuresis results in chugging, albumin given         1 Unit PRBCs, lidocaine nebulizers for cough, ancef started 1/21 sweep to 8, anxiety, I/Os (+) with lasix and acetazolamide 1/22 sweep at 5, chest CT bilateral upper lobe PEs 1/23 sweep at 5, delirium, I/Os even with lasix and acetazolamide         continued coughing, 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 1/24 sweep at 7.5, 1 Unit PRBCs, repeat bronchoscopy showed resolution of 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 1/25 sweep at 7.5, on propofol, lasix gtt at 7ml/hr, I/Os (+)         nebulized morphine and lidocaine for cough 1/26 sweep at 8, propofol/ precedex, cough present when weaned, lasix gtt at 81ml/hr        ECMO circuit changes, levophed started, 1 Unit PRBCs 1/27 sweep at 4, off sedation, delirium, I/Os even        lasix gtt restated, acetazolamide overnight 1/28 sweep at 7.5,  HTN labile 1/29 sweep of 6, patient reports a tickle in throat, cetacaine 1/30 sweep down to 6, cough improved with cetacaine and gabapentin 1/31 sweep at 5, agitation, off acetazolamide, lasix, weight up 2/1 cough, of pulmicort, cetacaine for cough, continued gabapentin, dexmethorphan       CT demonstrated trach placement against back of trachea 2/2 Trach collar trial somewhat effective in managing cough       low dose diltiazem for HR,  2/3 sweep at 4,trach cannula replacement (longer, more flexible bivona); brochoscopy  shows irritated mucosa       IV lasix and acetazolamide 2/4 sweep at 5, I/Os (+), BUN elevated, lasix gtt with diamox,       trach decannulation, increased gabapentin for cough 2/5 start HCAP coverage for rising WBC/temps, check Pct/cultures, CXR worse with lack of PEEP, diuresed well but started getting very hypotensive, sepsis vs. Too dry 04/18/20 blood cx grew E faecalis 2/7 PICC change, TEE 2/7>> sweep weans  Past Medical History  none  Interim history/subjective:  Increased WOB today Variable SvO2 raising question of cannula position vs. Aging circuit. Pain controlled. Slept okay.  Objective   Blood pressure (!) 139/91, pulse 99, temperature 98.8 F (37.1 C), temperature source Oral, resp. rate 20, height 5\' 4"  (1.626 m), weight 77.9 kg, SpO2 98 %.        Intake/Output Summary (Last 24 hours) at 05/03/2020 0850 Last data filed at 05/03/2020 0600 Gross per 24 hour  Intake 2194.45 ml  Output 2750 ml  Net -555.55 ml   Filed Weights   05/01/20 1900 05/02/20 0500 05/03/20 0600  Weight: 75.4 kg 72.7 kg 77.9 kg    Examination: Constitutional: no acute distress sitting in chair  Eyes: EOMI, pupils equal Ears, nose, mouth, and throat: trach scar CDI, MMM Cardiovascular: tachycardic, ext warm Respiratory: diminished at bases, less tachypneic Gastrointestinal: slightly distended, +BS Skin: stable ischemic changes to left hand Neurologic: moves all 4 ext to command Psychiatric: in a little better spirits today  Pre-post showing inadequate CO2 blowoff but good O2 exchange Remains hyponatremic Good Uop LDH improved Lactate okay H/h stable  Assessment & Plan:  Acute hypoxemic/hypercapnic respiratory failure due to severe ARDS from COVID-19 pneumonia Probable acute PE, and RV dysfunction s/p TPA Refractory coughing- improved after trach removal but still an issue Strep group F/ enterococcal pneumonia- s/p 10 days vanc 1/29; recurrent with bacteremia on 2/5. R effusion-  lymphocyte predominant transudate drained 2/16 - Continue VV ECMO, sweep weans titrated to WOB/ABG - Cough regimen as ordered - Continue supplemental oxygen via nasal cannula - IS, flutter, OOB mobility to prevent muscle atrophy - Keep even with diuresis: lasix and acetazolamide BID, doing well with this - Today: exchange ECMO circuit with inflammatory ppx with dexamethasone, high dose statin and higher PTT with bival (discussed with Dr. 3/16), if this fails to address variable SvO2 issues will need TEE and catheter position check this afternoon.  HTN.  Trials of reducing HR/BP have resulted in reduced pCO2 (likely related to fractional flow through ECMO circuit) -Continue coreg, taper clonidine (to end 2/20), continue dilt; goal to keep lower MAPs and HR to increase fractional circuit flow, no changes here  Hyponatremia- unclear cause, keep an eye on for now, no Rx obvious at this point  Acute delirium, with agitation- improved. -RASS goal -1 to 0, PRN, morphine PRN, oxycodone 15mg  now PRN, klonipin BID 1mg  -Plans to slowly wean this over next couple weeks: klonipin lowered 2/16, 2/20 add additional klonipin PRN -  Seroquel 25 (2/17)>>75 (2/18) >> 50 (2/19)  Sepsis secondary to E faecalis bacteremia- secondary to PNA, has grown enterococcus in sputum prior, previous vanc course completed 1/29. TEE on 2/7 without vegetations. PICC changed on 2/7. -Continue vanc through 2/21 per ID  Gastroparesis with some element ileus: improved but occasional vomiting spells with coughing attacks.  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  - wound care following, appreciate dressing recommendations - keep A-line out of left arm   R arm swelling- duplex neg, NTD here, going to space out cuff measurements since we have arterial line  Hyperglycemia due  to diabetes type II -Basal bolus insulin adjusted, goal CBG 100-180, AM levemir increased again today  Acute urinary retention- recurrent issue now improved -continue cardura -check intermittent bladder scans PRN  Deconditioning -con't working with PT, OT, SLP, appreciate help   Daily Goals Checklist  Pain/Anxiety/Delirium protocol (if indicated): see above VAP protocol (if indicated):  n/a DVT prophylaxis: bivalirudin GI prophylaxis: pantoprazole Glucose control: basal bolus insulin Mobility/therapy needs: Mobilization as tolerated Code Status: Full code Disposition: ICU  Patient critically ill due to ARDS Interventions to address this today ECMO titration Risk of deterioration without these interventions is high  I personally spent 36 minutes providing critical care not including any separately billable procedures  3/21 MD [facility] 04/12/2020 7:23 AM Prefer epic messenger for cross cover needs If after hours, please call E-link

## 2020-05-03 NOTE — Progress Notes (Signed)
ECMO Circuit Change  During multidisciplinary rounds, the decision was made by Dr. Katrinka Blazing and the care team to change out the ECMO circuit. A new circuit was set up and primed using sterile technique and brought to the patient bedside by Pete Pelt. The following staff were present for the procedure:    Devota Pace; ECMO Coordinator Myrla Halsted MD Larry Sierras; Perfusion Pete Pelt; ECMO Specialist Gar Gibbon; ECMO Specialist Misha Ferolito; Bedside RN Harland German; [facility] Merlene Laughter; RT  The field was prepped and draped, a timeout was performed, and the team completed a briefing prior to the start of the procedure. 2000 units of Heparin were given per pharmacy. The ECMO circuit was clamped out at 1000, cut and reconnected to the new circuit via a wet-to-wet connection. Flow was reinitiated at 4.26 lpm and 3140.  Disposable Lot Numbers:  Oxygenator Lot #: [id number] Tubing Lot #:  [id number] Cardiohelp Console #:  1

## 2020-05-03 NOTE — Progress Notes (Signed)
ANTICOAGULATION CONSULT NOTE  Pharmacy Consult for bivalirudin Indication: ECMO and VTE  Labs: Recent Labs    05/01/20 0406 05/01/20 0415 05/02/20 0353 05/02/20 0355 05/02/20 1616 05/02/20 1623 05/03/20 0355 05/03/20 0646 05/03/20 0735 05/03/20 0743  HGB 8.0*   < > 9.2*   < > 9.1*   < > 8.7* 9.2* 8.5* 8.8*  HCT 25.5*   < > 27.0*   < > 27.9*   < > 26.7* 27.0* 25.0* 26.0*  PLT 216   < > 227  --  231  --  232  --   --   --   APTT 60*   < > 65*  --  59*  --  59*  --   --   --   LABPROT 16.7*  --  16.8*  --   --   --  17.1*  --   --   --   INR 1.4*  --  1.4*  --   --   --  1.5*  --   --   --   CREATININE 0.68   < > 0.67  --  0.61  --  0.81  --   --   --    < > = values in this interval not displayed.    Assessment: 58 yoM admitted with COVID-19 PNA with worsening hypoxia, s/p cannulation for ECMO. Pt was started on IV heparin prior to cannulation due to acute DVTs and possible PE, transitioned to bivalirudin with ECMO. Now s/p tPA on 1/5 and tracheostomy on 1/6. Circuit last changed 1/26.  aPTT just slightly below goal this evening at 59. Plans for circuit change today  Goal of Therapy:  aPTT 60-80 seconds   Plan:  -Continue bivalirudin to 0.087 mg/kg/hr (using order-specific wt 72.1kg) -anticipate aPTT increase with heparin bolus around ECMO circuit change -Monitor q12h aPTT/CBC, LDH, and for s/sx of bleeding  Harland German, PharmD Clinical Pharmacist **Pharmacist phone directory can now be found on [email or web address] ([email or web address]).  Listed under [facility].

## 2020-05-04 ENCOUNTER — Inpatient Hospital Stay (HOSPITAL_COMMUNITY): Payer: Medicaid Other

## 2020-05-04 LAB — PROTIME-INR
INR: 1.5 — ABNORMAL HIGH (ref 0.8–1.2)
Prothrombin Time: 17.2 seconds — ABNORMAL HIGH (ref 11.4–15.2)

## 2020-05-04 LAB — POCT I-STAT 7, (LYTES, BLD GAS, ICA,H+H)
Acid-Base Excess: 7 mmol/L — ABNORMAL HIGH (ref 0.0–2.0)
Acid-Base Excess: 4 mmol/L — ABNORMAL HIGH (ref 0.0–2.0)
Acid-Base Excess: 7 mmol/L — ABNORMAL HIGH (ref 0.0–2.0)
Bicarbonate: 33 mmol/L — ABNORMAL HIGH (ref 20.0–28.0)
Bicarbonate: 32 mmol/L — ABNORMAL HIGH (ref 20.0–28.0)
Bicarbonate: 34 mmol/L — ABNORMAL HIGH (ref 20.0–28.0)
Calcium, Ion: 1.31 mmol/L (ref 1.15–1.40)
Calcium, Ion: 1.28 mmol/L (ref 1.15–1.40)
Calcium, Ion: 1.24 mmol/L (ref 1.15–1.40)
HCT: 26 % — ABNORMAL LOW (ref 39.0–52.0)
HCT: 29 % — ABNORMAL LOW (ref 39.0–52.0)
HCT: 29 % — ABNORMAL LOW (ref 39.0–52.0)
Hemoglobin: 8.8 g/dL — ABNORMAL LOW (ref 13.0–17.0)
Hemoglobin: 9.9 g/dL — ABNORMAL LOW (ref 13.0–17.0)
Hemoglobin: 9.9 g/dL — ABNORMAL LOW (ref 13.0–17.0)
O2 Saturation: 100 %
O2 Saturation: 99 %
O2 Saturation: 98 %
Patient temperature: 37
Patient temperature: 37
Potassium: 3.9 mmol/L (ref 3.5–5.1)
Potassium: 4 mmol/L (ref 3.5–5.1)
Potassium: 4.2 mmol/L (ref 3.5–5.1)
Sodium: 133 mmol/L — ABNORMAL LOW (ref 135–145)
Sodium: 135 mmol/L (ref 135–145)
Sodium: 135 mmol/L (ref 135–145)
TCO2: 35 mmol/L — ABNORMAL HIGH (ref 22–32)
TCO2: 34 mmol/L — ABNORMAL HIGH (ref 22–32)
TCO2: 36 mmol/L — ABNORMAL HIGH (ref 22–32)
pCO2 arterial: 56 mmHg — ABNORMAL HIGH (ref 32.0–48.0)
pCO2 arterial: 64.9 mmHg — ABNORMAL HIGH (ref 32.0–48.0)
pCO2 arterial: 64.5 mmHg — ABNORMAL HIGH (ref 32.0–48.0)
pH, Arterial: 7.379 (ref 7.350–7.450)
pH, Arterial: 7.3 — ABNORMAL LOW (ref 7.350–7.450)
pH, Arterial: 7.33 — ABNORMAL LOW (ref 7.350–7.450)
pO2, Arterial: 240 mmHg — ABNORMAL HIGH (ref 83.0–108.0)
pO2, Arterial: 149 mmHg — ABNORMAL HIGH (ref 83.0–108.0)
pO2, Arterial: 126 mmHg — ABNORMAL HIGH (ref 83.0–108.0)

## 2020-05-04 LAB — BASIC METABOLIC PANEL
Anion gap: 11 (ref 5–15)
Anion gap: 9 (ref 5–15)
BUN: 36 mg/dL — ABNORMAL HIGH (ref 6–20)
BUN: 31 mg/dL — ABNORMAL HIGH (ref 6–20)
CO2: 27 mmol/L (ref 22–32)
CO2: 29 mmol/L (ref 22–32)
Calcium: 8.7 mg/dL — ABNORMAL LOW (ref 8.9–10.3)
Calcium: 8.7 mg/dL — ABNORMAL LOW (ref 8.9–10.3)
Chloride: 96 mmol/L — ABNORMAL LOW (ref 98–111)
Chloride: 96 mmol/L — ABNORMAL LOW (ref 98–111)
Creatinine, Ser: 0.77 mg/dL (ref 0.61–1.24)
Creatinine, Ser: 0.67 mg/dL (ref 0.61–1.24)
GFR, Estimated: >60 mL/min (ref >60)
GFR, Estimated: >60 mL/min (ref >60)
Glucose, Bld: 194 mg/dL — ABNORMAL HIGH (ref 70–99)
Glucose, Bld: 151 mg/dL — ABNORMAL HIGH (ref 70–99)
Potassium: 4.2 mmol/L (ref 3.5–5.1)
Potassium: 3.9 mmol/L (ref 3.5–5.1)
Sodium: 134 mmol/L — ABNORMAL LOW (ref 135–145)
Sodium: 134 mmol/L — ABNORMAL LOW (ref 135–145)

## 2020-05-04 LAB — GLUCOSE, CAPILLARY
Glucose-Capillary: 189 mg/dL — ABNORMAL HIGH (ref 70–99)
Glucose-Capillary: 201 mg/dL — ABNORMAL HIGH (ref 70–99)
Glucose-Capillary: 247 mg/dL — ABNORMAL HIGH (ref 70–99)
Glucose-Capillary: 141 mg/dL — ABNORMAL HIGH (ref 70–99)

## 2020-05-04 LAB — CBC
HCT: 26.4 % — ABNORMAL LOW (ref 39.0–52.0)
HCT: 28.9 % — ABNORMAL LOW (ref 39.0–52.0)
Hemoglobin: 9 g/dL — ABNORMAL LOW (ref 13.0–17.0)
Hemoglobin: 9.2 g/dL — ABNORMAL LOW (ref 13.0–17.0)
MCH: 30.9 pg (ref 26.0–34.0)
MCH: 29.8 pg (ref 26.0–34.0)
MCHC: 34.1 g/dL (ref 30.0–36.0)
MCHC: 31.8 g/dL (ref 30.0–36.0)
MCV: 90.7 fL (ref 80.0–100.0)
MCV: 93.5 fL (ref 80.0–100.0)
Platelets: 230 10*3/uL (ref 150–400)
Platelets: 252 10*3/uL (ref 150–400)
RBC: 2.91 MIL/uL — ABNORMAL LOW (ref 4.22–5.81)
RBC: 3.09 MIL/uL — ABNORMAL LOW (ref 4.22–5.81)
RDW: 16.6 % — ABNORMAL HIGH (ref 11.5–15.5)
RDW: 16.4 % — ABNORMAL HIGH (ref 11.5–15.5)
WBC: 8.8 10*3/uL (ref 4.0–10.5)
WBC: 10.6 10*3/uL — ABNORMAL HIGH (ref 4.0–10.5)
nRBC: 0 % (ref 0.0–0.2)
nRBC: 0.2 % (ref 0.0–0.2)

## 2020-05-04 LAB — LACTATE DEHYDROGENASE: LDH: 330 U/L — ABNORMAL HIGH (ref 98–192)

## 2020-05-04 LAB — APTT
aPTT: 62 seconds — ABNORMAL HIGH (ref 24–36)
aPTT: 57 seconds — ABNORMAL HIGH (ref 24–36)

## 2020-05-04 LAB — LACTIC ACID, PLASMA
Lactic Acid, Venous: 0.9 mmol/L (ref 0.5–1.9)
Lactic Acid, Venous: 0.9 mmol/L (ref 0.5–1.9)

## 2020-05-04 LAB — FIBRINOGEN: Fibrinogen: 302 mg/dL (ref 210–475)

## 2020-05-04 MED ORDER — CLONAZEPAM 1 MG PO TABS: 2.0000 mg | ORAL_TABLET | Freq: Every day | ORAL | Status: DC

## 2020-05-04 MED ORDER — CLONAZEPAM 1 MG PO TABS: 1.0000 mg | ORAL_TABLET | Freq: Every day | ORAL | Status: DC

## 2020-05-04 MED ORDER — DOXAZOSIN MESYLATE 4 MG PO TABS
4.0000 mg | ORAL_TABLET | Freq: Every day | ORAL | Status: DC
  Administered 2020-05-04 – 2020-05-05 (×2): 4 mg
  Filled 2020-05-04 (×3): qty 1

## 2020-05-04 MED ORDER — CLONAZEPAM 0.5 MG PO TABS
0.5000 mg | ORAL_TABLET | Freq: Every day | ORAL | Status: DC
  Administered 2020-05-05 – 2020-05-08 (×4): 0.5 mg via ORAL
  Filled 2020-05-04 (×4): qty 1

## 2020-05-04 MED ORDER — CLONAZEPAM 1 MG PO TABS
1.0000 mg | ORAL_TABLET | Freq: Every day | ORAL | Status: DC
  Administered 2020-05-05 – 2020-05-08 (×4): 1 mg via ORAL
  Filled 2020-05-04 (×4): qty 1

## 2020-05-04 MED ORDER — GABAPENTIN 250 MG/5ML PO SOLN
300.0000 mg | Freq: Two times a day (BID) | ORAL | Status: DC
  Administered 2020-05-05 – 2020-05-06 (×4): 300 mg via ORAL
  Filled 2020-05-04 (×5): qty 6

## 2020-05-04 NOTE — Procedures (Signed)
Extracorporeal support note  ECLS cannulation date: 04-04-20 Last circuit change: 05/03/20  Indication: Acute hypoxic respiratory failure due to ARDS from COVID-19 pneumonia with RV dysfunction.   Configuration: Venovenous  Drainage cannula: 32 French crescent cannula via right IJ Return cannula: Same  Pump speed: 3400 RPM Pump flow: 4.61 L/min Pump used: Cardio help  Oxygenator: Cardio help O2 blender: 100% Sweep gas: 4 L  Circuit check: no clots Anticoagulant: Bivalirudin Anticoagulation targets: PTT 60-80  Changes in support:  - Sweep wean with new circuit and cannula adjustment done yesterday  Anticipated goals/duration of support: Bridge to recovery   Multidisciplinary ECMO rounds completed.  Lorin Glass, MD 05/04/20 8:44 AM [facility]

## 2020-05-04 NOTE — Progress Notes (Signed)
PICC is malpositioned. Per primary RN is ok to power flush  R TL PICC.Pt is sitting at the bedside chair;power flushed done with two IV RN's, with RT and 2 RN's at bedside; pt tolerated well. Chest xray will be done after 30 minutes.

## 2020-05-04 NOTE — Progress Notes (Signed)
ECMO pt walked with PT/OT, bedside RN, and ECMO specialist around ICU. D/T increased WOB pt sweep gas increased to 8 for duration of exercise and returned to prior setting of 4 upon return to room and decreased WOB.

## 2020-05-04 NOTE — Progress Notes (Signed)
ANTICOAGULATION CONSULT NOTE  Pharmacy Consult for bivalirudin Indication: ECMO and VTE  Labs: Recent Labs    05/02/20 0353 05/02/20 0355 05/03/20 0355 05/03/20 0646 05/03/20 1512 05/03/20 1519 05/03/20 1745 05/03/20 2345 05/04/20 0414  HGB 9.2*   < > 8.7*   < > 9.5*   < > 9.9* 8.5* 9.0*  HCT 27.0*   < > 26.7*   < > 28.4*   < > 29.0* 25.0* 26.4*  PLT 227   < > 232  --  225  --   --   --  230  APTT 65*   < > 59*  --  62*  --   --   --  62*  LABPROT 16.8*  --  17.1*  --   --   --   --   --  17.2*  INR 1.4*  --  1.5*  --   --   --   --   --  1.5*  CREATININE 0.67   < > 0.81  --  0.81  --   --   --  0.77   < > = values in this interval not displayed.    Assessment: 56 yoM admitted with COVID-19 PNA with worsening hypoxia, s/p cannulation for ECMO. Pt was started on IV heparin prior to cannulation due to acute DVTs and possible PE, transitioned to bivalirudin with ECMO. Now s/p tPA on 1/5 and tracheostomy on 1/6. ECMO circuit changed on 05/03/2020 -aPTT at goal on bivalirudin 0.087 mg/kg/hr, hg= 9, plt= 230  Goal of Therapy:  aPTT 60-80 seconds   Plan:  -Continue bivalirudin to 0.087 mg/kg/hr (using order-specific wt 72.1kg) -Monitor q12h aPTT/CBC, LDH, and for s/sx of bleeding  Harland German, PharmD Clinical Pharmacist **Pharmacist phone directory can now be found on [email or web address] ([email or web address]).  Listed under [facility].

## 2020-05-04 NOTE — Progress Notes (Signed)
Occupational Therapy Treatment Patient Details Name: Alex [name] MRN: [id number] DOB: 06-29-1970 Today's Date: 05/04/2020    History of present illness Pt is 50 y.o. male  with no significant past medical history admitted on 02/15/2020 with dyspnea, cough, nausea/vomiting ~ 1 week ago with worsening symptoms of body aches and fatigue and +COVID 02/07/20 and admitted with shortness of breath. Required intubation 03/10/20. Cannulated for VV ECMO Mar 29, 2020. Oxygenating better with ECMO. Also evidence of RLE DVT, RV thrombus, and high suspicion of PE. Has required chest tube to L lung for collapse which needs further reposition with recurrent collapse. Extubated 03-15-19.  Reintubated 1/5 and trach placed 1/6. Decannulated 04/16/20.   OT comments  Pt making steady progress towards OT goals this session. Pt seen in conjunction with PT to progress pts functional mobility goals while optimizing pts participation. Pt making excellent progress towards functional mobility goals with pt able to ambulate 150 ft with L platform RW needing x3 seated rest breaks. Pts quality of mobility continues to improve however pt needs frequent cues to correct narrow base of support and flexed posture. Pt additionally making progress with pts standing tolerance for higher level BADLs with pt able to static stand ~ 5 mins with BUE support. Pt would continue to benefit from skilled occupational therapy while admitted and after d/c to address the below listed limitations in order to improve overall functional mobility and facilitate independence with BADL participation. DC plan remains appropriate, will follow acutely per POC.     Follow Up Recommendations  CIR;Supervision/Assistance - 24 hour    Equipment Recommendations  Other (comment) (will further asssess)    Recommendations for Other Services      Precautions / Restrictions Precautions Precautions: Fall Precaution Comments: ECMO, NGtube, Foley, Pt decannulated himself  2/4 Restrictions Weight Bearing Restrictions: No Other Position/Activity Restrictions: L platform walker used for mobility due to discomfort with grip to L hand       Mobility Bed Mobility               General bed mobility comments: OOB in recliner    Transfers Overall transfer level: Needs assistance Equipment used: Left platform walker Transfers: Sit to/from Stand;Stand Pivot Transfers Sit to Stand: +2 physical assistance;+2 safety/equipment;Min assist;Mod assist         General transfer comment: min-modAx2 for 3x power up into standing from recliner, vc for scooting hips forward and placement of LE    Balance Overall balance assessment: Needs assistance Sitting-balance support: No upper extremity supported;Feet unsupported Sitting balance-Leahy Scale: Fair     Standing balance support: Bilateral upper extremity supported;During functional activity Standing balance-Leahy Scale: Poor Standing balance comment: requires BUE support fot static standing with RW ~ 5 mins                           ADL either performed or assessed with clinical judgement   ADL Overall ADL's : Needs assistance/impaired Eating/Feeding: Supervision/ safety;Set up;Sitting Eating/Feeding Details (indicate cue type and reason): observed pt to self feed with built up utensils with set- up assist         Lower Body Bathing: Total assistance;Sit to/from stand Lower Body Bathing Details (indicate cue type and reason): simulated via LB dressing Upper Body Dressing : Moderate assistance;Sitting Upper Body Dressing Details (indicate cue type and reason): to don gown as back side cover Lower Body Dressing: Total assistance;Sit to/from stand Lower Body Dressing Details (indicate cue type and reason): to don  shoes from recliner Toilet Transfer: Minimal assistance;Moderate assistance;Ambulation (L platform RW) Toilet Transfer Details (indicate cue type and reason): simulated via  functional mobility with pt able to ambulate ~ 150 ft with L platform Rw and MIN - MOD A +2 with chair follow, Rn reports they have been getting pt up to [facility]         Functional mobility during ADLs: Minimal assistance;+2 for physical assistance;+2 for safety/equipment;Moderate assistance General ADL Comments: pt making excellent progress able to ambulate ~ 150 ft with L platform RW, pt continues to present with decreased activity tolerance, impaired balance and generalized weakness     Vision       Perception     Praxis      Cognition Arousal/Alertness: Awake/alert Behavior During Therapy: Flat affect Overall Cognitive Status: Within Functional Limits for tasks assessed                                 General Comments: moments of lethargy but seemed more conversant this session in comparision to previous therapy notes        Exercises General Exercises - Lower Extremity Hip ABduction/ADduction: Seated;Strengthening;AROM;5 reps (light orange resistance band) Other Exercises Other Exercises: standing in RW for 5 min, worked on pelvic rotation forward and back and hip circles, good tolerance for standing today   Shoulder Instructions       General Comments At rest O2 4L O2 via HFNC, ECMO Sweep 4, for ambulation increased to 8L via HFNC and Sweep of 8    Pertinent Vitals/ Pain       Pain Assessment: No/denies pain  Home Living                                          Prior Functioning/Environment              Frequency  Min 2X/week        Progress Toward Goals  OT Goals(current goals can now be found in the care plan section)  Progress towards OT goals: Progressing toward goals  Acute Rehab OT Goals Patient Stated Goal: continue to walk OT Goal Formulation: With patient Time For Goal Achievement: 06/01/2020 Potential to Achieve Goals: Good  Plan Discharge plan remains appropriate;Frequency remains appropriate     Co-evaluation      Reason for Co-Treatment: Complexity of the patient's impairments (multi-system involvement) PT goals addressed during session: Mobility/safety with mobility OT goals addressed during session: ADL's and self-care      AM-PAC OT "6 Clicks" Daily Activity     Outcome Measure   Help from another person eating meals?: A Little (s/u) Help from another person taking care of personal grooming?: A Little Help from another person toileting, which includes using toliet, bedpan, or urinal?: A Lot Help from another person bathing (including washing, rinsing, drying)?: A Lot Help from another person to put on and taking off regular upper body clothing?: A Lot Help from another person to put on and taking off regular lower body clothing?: Total 6 Click Score: 13    End of Session Equipment Utilized During Treatment: Oxygen;Gait belt;Rolling walker  OT Visit Diagnosis: Muscle weakness (generalized) (M62.81);Other symptoms and signs involving cognitive function;Other abnormalities of gait and mobility (R26.89)   Activity Tolerance Patient tolerated treatment well   Patient Left Other (comment) (handed off  to PT and rest of ECMO team)   Nurse Communication          Time: 4268-3419 OT Time Calculation (min): 49 min  Charges: OT General Charges $OT Visit: 1 Visit OT Treatments $Therapeutic Activity: 23-37 mins  Lenor Derrick., COTA/L Acute Rehabilitation Services [phone]    Barron Schmid 05/04/2020, 3:32 PM

## 2020-05-04 NOTE — Progress Notes (Signed)
Patient ID: Alex [name], male   DOB: 14-Jun-1970, 50 y.o.   MRN: [id number]  This NP visited patient at the bedside as a follow up for palliative medcien needs and emotional support.  Medical records reviewed.  Discussed with treatment team  49 y.o.malewith no significantpast medical history admitted on12/28/2021with dyspnea, cough, nausea/vomiting ~ 1 week ago with worsening symptoms of body aches and fatigue and +COVID 02/07/20 and admitted with shortness of breath.Required intubation 03/10/20. Cannulated for VV ECMO 04-10-2020.  Today is day 13 of this hospitalization.  Patient is  independent from  ventilator, remains on ECMO.      Patient is critically ill and making  slow prognosis, he continues to work with therapies    He remains  high risk for decompensation 2/2 to complex medical situation  Created space and opportunity for [name] to continue to  explore his thoughts and feelings regarding his current medical situation.    [name] communicates his readiness to "talk about things"    This has been a difficult time for him.  He is "thinking about what the future will be".      Education offered to patient regarding the importance of continued conversation with his family/support persons  and the medical providers regarding overall plan of care and treatment options,  ensuring decisions are within the context of the patients values and GOCs.   He is in agreement for me to visit for continued conversation.   I let him know I will be out of the hospital until Monday, I will follow-up with him at that time.  Emotional support and attempted reassurance to decrease concerns.  Patient is seriously ill and high risk for decompensation.  Questions and concerns addressed   Discussed with bedside RN       PMT will continue to support holistically  Total time spent on the unit was 25 minutes  Greater than 50% of the time was spent in counseling and coordination of care  Lorinda Creed NP  Palliative Medicine Team Team Phone # [phone] Pager [phone]

## 2020-05-04 NOTE — Progress Notes (Signed)
Pt L TL PICC is in SVC per Chest Xray, after power flushed.RN aware.

## 2020-05-04 NOTE — Progress Notes (Signed)
Critical Care Progress Note  NAME:  Alex [name], MRN:  [id number], DOB:  [birth year], LOS: 56 ADMISSION DATE:  02/26/2020, CONSULTATION DATE: 02/28/2020 REFERRING MD: Wynona Neat -LBPCCM, CHIEF COMPLAINT: Respiratory failure requiring ECMO  HPI/course in hospital  50 year old man admitted to hospital 12/28 with 1 week history of dyspnea cough nausea and vomiting.  Initially admitted to [facility] long hospital and placed on high flow nasal cannula but rapidly failed and required intubation 12/29.  Persistent hypoxic respiratory failure with PF ratio 55 in spite of 18 of PEEP FiO2 0.1 despite paralytics.  Did not improve with prone ventilation  Cannulated for VV ECMO 12/30 via right IJ crescent cannula.  ECMO circuit was changed on 04/07/2020 Iatrogenic pneumothorax from left subclavian triple-lumen placement  12/28 admitted covid, ARDS 12/30 ECMO cannulation, iatrogenic pneumothrorax, DVT, Bivalrudin started           Left subclavian hematoma, chest tube, ceftriaxone/ azithromycin started 12/31 1Unit PRBC 1/1 Palliative consult, noted HTN, fentanyl only 1/2 Extubation I/O positive, left lung re-expanded, EF 60-65%, Lasix 20mg  BID 1/3 BiPAP in place, HTN to 190s, 200s, labetolol for BP, I/O even, cefepime and vancomycin started 1/4 agitation off BiPAP, possible aspiration 1/5 agitation overnight, reintubated      ECHO with RV dilation, ECMO cannula repositioned, LUE DVT (+), 1/2 dose tPA given,      I/O up, on lasix, Epi, NE, vasopressin started, HR/ rhythm issues noted 1/6 Tracheostomy, hypoglycemic when off tube feeds       Cortrack tune issues 1/7 on lasix gtt 4mg /hr with diuresis, agitation improved 1/8 starting steroid taper 1/9 severe agitation, heavy sedation required, lasix gtt 6mg /hr, I/Os (-)       precedex and fentanyl 1/10 Vanc stopped 1/11 sweep to 2, lasix gtt at 4mg /hr, weight up, metoprolol added for HTN 1/12 fevers to 99 noted, lasix gtt and metazolone 1/13 lasix gtt  dced 1/14 intermittent HTN, possibly flash pulmonary edema tied to sedation        Ischemic left hand changes, 1 Unit PRBCs 1/15 1/16 sweep from 4 to 2.5, chest tube removed 1/17 sweep at 6, trial of nebulized morphine for cough, LUE dopplers show no obstruction 1/18 two events of 2nd degree type II HB noted with coughing         sweep at 4, IV lasix 40, acetazolamide, distal XLT trach placement         agitation and BP spikes 1/19 agitation, air hunger, seroquel, klonopin, oxy, valproate, ketamine trial 1/20 sweep at 3.5, diuresis results in chugging, albumin given         1 Unit PRBCs, lidocaine nebulizers for cough, ancef started 1/21 sweep to 8, anxiety, I/Os (+) with lasix and acetazolamide 1/22 sweep at 5, chest CT bilateral upper lobe PEs 1/23 sweep at 5, delirium, I/Os even with lasix and acetazolamide         continued coughing, 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 1/24 sweep at 7.5, 1 Unit PRBCs, repeat bronchoscopy showed resolution of 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 1/25 sweep at 7.5, on propofol, lasix gtt at 72ml/hr, I/Os (+)         nebulized morphine and lidocaine for cough 1/26 sweep at 8, propofol/ precedex, cough present when weaned, lasix gtt at 66ml/hr        ECMO circuit changes, levophed started, 1 Unit PRBCs 1/27 sweep at 4, off sedation, delirium, I/Os even        lasix gtt restated, acetazolamide overnight 1/28 sweep at 7.5,  HTN labile 1/29 sweep of 6, patient reports a tickle in throat, cetacaine 1/30 sweep down to 6, cough improved with cetacaine and gabapentin 1/31 sweep at 5, agitation, off acetazolamide, lasix, weight up 2/1 cough, of pulmicort, cetacaine for cough, continued gabapentin, dexmethorphan       CT demonstrated trach placement against back of trachea 2/2 Trach collar trial somewhat effective in managing cough       low dose diltiazem for HR,  2/3 sweep at 4,trach cannula replacement (longer, more flexible bivona); brochoscopy  shows irritated mucosa       IV lasix and acetazolamide 2/4 sweep at 5, I/Os (+), BUN elevated, lasix gtt with diamox,       trach decannulation, increased gabapentin for cough 2/5 start HCAP coverage for rising WBC/temps, check Pct/cultures, CXR worse with lack of PEEP, diuresed well but started getting very hypotensive, sepsis vs. Too dry 04/18/20 blood cx grew E faecalis 2/7 PICC change, TEE 2/7>> sweep weans 2/22 Cannula adjustment, circuit change  Past Medical History  none  Interim history/subjective:  Doing better after circuit change and cannula adjustment Denies pain Bladder retention issues acting up again.  Objective   Blood pressure (!) 106/56, pulse (!) 109, temperature 98.1 F (36.7 C), temperature source Core (Comment), resp. rate (!) 28, height 5\' 4"  (1.626 m), weight 78.8 kg, SpO2 100 %.        Intake/Output Summary (Last 24 hours) at 05/04/2020 0835 Last data filed at 05/04/2020 0700 Gross per 24 hour  Intake 2749.15 ml  Output 2050 ml  Net 699.15 ml   Filed Weights   05/02/20 0500 05/03/20 0600 05/04/20 0500  Weight: 72.7 kg 77.9 kg 78.8 kg    Examination: Constitutional: no acute distress sitting up in chair  Eyes: EOMI, pupils equal, facial plethora stable Ears, nose, mouth, and throat: MMM, prior trach site CDI Cardiovascular: RRR, ext warm Respiratory: shallow inspiratory efforts, mild tachypnea, no wheezing Gastrointestinal: soft, protuberant, +BS Skin: No rashes, normal turgor, stable ischemic changes of L hand Neurologic: moves all 4 ext to command Psychiatric: RASS 0  Weight up slightly BMP stable Stable severe lung disease on CXR  Assessment & Plan:  Acute hypoxemic/hypercapnic respiratory failure due to severe ARDS from COVID-19 pneumonia Probable acute PE, and RV dysfunction s/p TPA Refractory coughing- improved after trach removal but still an issue intermittently Strep group F/ enterococcal pneumonia- s/p 10 days vanc 1/29; recurrent  with bacteremia on 2/5 now s/p vanc x 14+ days R effusion- lymphocyte predominant transudate drained 2/16 - Continue VV ECMO, sweep weans titrated to WOB/ABG - Cough regimen as ordered - Continue supplemental oxygen via nasal cannula - IS, flutter, OOB mobility to prevent muscle atrophy - Keep even with diuresis: lasix and acetazolamide BID, doing well with this - Today: slow sweep wean, work on mobility, keep even fluidwise  HTN.  Trials of reducing HR/BP have resulted in reduced pCO2 (likely related to fractional flow through ECMO circuit) -Continue coreg, taper clonidine (to end 2/20), continue dilt; goal to keep lower MAPs and HR to increase fractional circuit flow, no changes here  Hyponatremia- mild, unclear cause, keep an eye on for now, no Rx obvious at this point  Acute delirium, with agitation- improved. -RASS goal -1 to 0, PRN, morphine PRN, oxycodone 15mg  now PRN, klonipin BID 1mg  and klonipin PRN -Plan for 2/23: dropping klonipin and gabapentin a bit -Seroquel 25 (2/17)>>75 (2/18) >> 50 (2/19)  Sepsis secondary to E faecalis bacteremia- secondary to PNA, has grown  enterococcus in sputum prior, previous vanc course completed 1/29. TEE on 2/7 without vegetations. PICC changed on 2/7.  14 day vanc therapy from PICC change completed 2/21.  Gastroparesis with some element ileus: improved but occasional vomiting spells with coughing attacks.  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  - wound care following, appreciate dressing recommendations - keep A-line out of left arm   R arm swelling- duplex neg, NTD here, going to space out cuff measurements since we have arterial line  Hyperglycemia due to diabetes type II -Basal bolus insulin adjusted, goal CBG 100-180, had spike 2/21 due to dexamethasone for circuit change  Acute  urinary retention- recurrent issue -increase cardura -check intermittent bladder scans PRN -does better standing with this, hopefully does not need another foley  Deconditioning -con't working with PT, OT, SLP, appreciate help   Daily Goals Checklist  Pain/Anxiety/Delirium protocol (if indicated): see above VAP protocol (if indicated):  n/a DVT prophylaxis: bivalirudin GI prophylaxis: pantoprazole Glucose control: basal bolus insulin Mobility/therapy needs: Mobilization as tolerated Code Status: Full code Disposition: ICU  Patient critically ill due to ARDS Interventions to address this today ECMO titration Risk of deterioration without these interventions is high  I personally spent 35 minutes providing critical care not including any separately billable procedures  3/21 MD [facility] 04/12/2020 7:23 AM Prefer epic messenger for cross cover needs If after hours, please call E-link

## 2020-05-04 NOTE — Progress Notes (Signed)
Patient ID: Alex [name], male   DOB: Jul 04, 1970, 50 y.o.   MRN: [id number]     Advanced Heart Failure Rounding Note  PCP-Cardiologist: No primary care provider on file.   Subjective:    - 12/30: VV ECMO cannulation - 12/31: Left chest tube replaced - 1/2: Extubated. Echo with EF 60-65%, mildly dilated RV with mildly decreased systolic function.  - 1/4: Agitated, suspected aspiration.  Re-intubated.  - 1/5: ECMO cannula repositioned under TEE guidance. TEE showed moderately dilated/moderate-severely dysfunctional RV in setting of hypoxemia. LUE DVT found.  Patient got 1/2 dose of TPA due to initial concern for large PE.  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  - 1/6: Tracheostomy - 1/7: Echo with mild RV dilation/mild RV dysfunction.  - 1/16: Left chest tube out - 1/17: LUE arterial dopplers repeated, showed no obstruction.  - 1/20: Echo with EF 65-70%, mildly D-shaped septum, mildly dilated and mildly dysfunctional RV.  - 1/22: CTA chest: Bilateral upper lobe PEs (suspect chronic), changes c/w ARDS - 1/23: Bronchoscopy showed semi-occlusive ?mass/polyp in the trachea.  - 1/24: Bronchoscopy showed resolution of 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 - 1/26: ECMO circuit changed.  - 2/1: CT chest showed diffuse bronchiectasis as well as diffuse opacity consistent with COVID-19 PNA with ARDS. - 2/3: Trach exchange - 2/5: Decannulated to HFNC - 2/6: Enterococcal PNA/bacteremia.  Echo showed EF 65-70%, normal-appearing RV, no vegetation noted.  - 2/7: TEE with no vegetation, EF 55-60%, RV low normal function with normal size.  - 2/16: Right thoracentesis 500 cc - 2/22: ECMO circuit changed.  ECMO cannula repositioned under TEE guidance. TEE with EF 60-65%, normal RV.   On 4 L HFNC this morning.  Circuit functioning well today.  CXR with stable bilateral infiltrates.   He is sitting in chair, no complaints.   ECMO parameters: 3400 rpm Flow 4.57 L/min Pvenous -80 Delta P  23 Sweep 4 HFNC 4 L  ABG 7.35/59/178/99% LDH 330 PTT 62 Lactate 0.9 Hgb 9  Objective:   Weight Range: 78.8 kg Body mass index is 29.82 kg/m.   Vital Signs:   Temp:  [98.1 F (36.7 C)-98.6 F (37 C)] 98.1 F (36.7 C) (02/22 0400) Pulse Rate:  [90-113] 109 (02/22 0700) Resp:  [0-28] 28 (02/22 0700) BP: (94-162)/(56-98) 106/56 (02/22 0200) SpO2:  [86 %-100 %] 100 % (02/22 0700) Arterial Line BP: (94-217)/(54-106) 157/71 (02/22 0700) Weight:  [78.8 kg] 78.8 kg (02/22 0500) Last BM Date: 05/01/20  Weight change: Filed Weights   05/02/20 0500 05/03/20 0600 05/04/20 0500  Weight: 72.7 kg 77.9 kg 78.8 kg    Intake/Output:   Intake/Output Summary (Last 24 hours) at 05/04/2020 0738 Last data filed at 05/04/2020 0700 Gross per 24 hour  Intake 2774.3 ml  Output 2050 ml  Net 724.3 ml      Physical Exam    General: NAD Neck: No JVD, no thyromegaly or thyroid nodule.  Lungs: Crackles at bases bilaterally.  CV: Nondisplaced PMI.  Heart regular S1/S2, no S3/S4, no murmur. Trace ankle edema.   Abdomen: Soft, nontender, no hepatosplenomegaly, no distention.  Skin: Intact without lesions or rashes.  Neurologic: Alert and oriented x 3.  Psych: Normal affect. Extremities: Dry gangrene left digits.   HEENT: Normal.    Telemetry   NSR 100s. Personally reviewed   Labs    CBC Recent Labs    05/03/20 1512 05/03/20 1519 05/03/20 2345 05/04/20 0414  WBC 8.7  --   --  8.8  HGB 9.5*   < > 8.5* 9.0*  HCT 28.4*   < > 25.0* 26.4*  MCV 89.9  --   --  90.7  PLT 225  --   --  230   < > = values in this interval not displayed.   Basic Metabolic Panel Recent Labs    16/12/9600/21/22 1512 05/03/20 1519 05/03/20 2345 05/04/20 0414  NA 133*   < > 133* 134*  K 3.8   < > 4.6 4.2  CL 97*  --   --  96*  CO2 27  --   --  27  GLUCOSE 203*  --   --  194*  BUN 33*  --   --  36*  CREATININE 0.81  --   --  0.77  CALCIUM 8.7*  --   --  8.7*   < > = values in this interval not  displayed.   Liver Function Tests No results for input(s): AST, ALT, ALKPHOS, BILITOT, PROT, ALBUMIN in the last 72 hours. No results for input(s): LIPASE, AMYLASE in the last 72 hours. Cardiac Enzymes No results for input(s): CKTOTAL, CKMB, CKMBINDEX, TROPONINI in the last 72 hours.  BNP: BNP (last 3 results) No results for input(s): BNP in the last 8760 hours.  ProBNP (last 3 results) No results for input(s): PROBNP in the last 8760 hours.   D-Dimer No results for input(s): DDIMER in the last 72 hours. Hemoglobin A1C No results for input(s): HGBA1C in the last 72 hours. Fasting Lipid Panel No results for input(s): CHOL, HDL, LDLCALC, TRIG, CHOLHDL, LDLDIRECT in the last 72 hours. Thyroid Function Tests No results for input(s): TSH, T4TOTAL, T3FREE, THYROIDAB in the last 72 hours.  Invalid input(s): FREET3  Other results:   Imaging    DG CHEST PORT 1 VIEW  Result Date: 05/04/2020 CLINICAL DATA:  ECMO therapy, respiratory failure EXAM: PORTABLE CHEST 1 VIEW COMPARISON:  Radiograph 05/03/2020 FINDINGS: Redemonstration of the left upper extremity PICC which crosses midline and redirected superiorly likely in the SVC directing towards the right brachiocephalic vein. ECMO cannula tip terminates in the inferior vena cava. Transesophageal tip terminates in the left upper quadrant. Telemetry leads overlie the chest. Diffuse airspace opacity throughout both lungs is unchanged from prior. Cardiac silhouette largely obscured by this density. No visible pneumothorax. Layering effusion difficult to exclude. No acute osseous or soft tissue abnormality. IMPRESSION: 1. Stable appearance of the left upper extremity PICC which crosses midline and redirected superiorly likely in the SVC directing towards the right brachiocephalic vein. Consider repositioning 2. Additional lines and tubes as above. 3. Stable diffuse airspace opacity throughout both lungs. These results will be called to the ordering  clinician or representative by the Radiologist Assistant, and communication documented in the PACS or [employer] Dashboard. Electronically Signed   By: Kreg ShropshirePrice  DeHay M.D.   On: 05/04/2020 06:55   DG CHEST PORT 1 VIEW  Result Date: 05/03/2020 CLINICAL DATA:  ECMO therapy.  Respiratory failure EXAM: PORTABLE CHEST 1 VIEW COMPARISON:  May 03, 2020 study obtained earlier in the day FINDINGS: The left-sided central catheter tip is again to the right of midline directed superiorly, likely in the distal most aspect of the right internal jugular vein. ECMO catheter tip is in the inferior vena cava. Enteric tube tip is below the diaphragm. No pneumothorax. Diffuse airspace opacity throughout the lungs bilaterally is stable. Cardiac silhouette is obscured by the extensive airspace opacity bilaterally. No appreciable bone lesions. IMPRESSION: Tube and catheter positions as described  without pneumothorax. Note that the left-sided central catheter tip is directed superiorly in what most likely represents a distal aspect of the right internal jugular vein, unchanged from earlier in the day. Widespread airspace opacity persists. Electronically Signed   By: Bretta Bang III M.D.   On: 05/03/2020 15:17   DG CHEST PORT 1 VIEW  Result Date: 05/03/2020 CLINICAL DATA:  Patient on ECMO. Respiratory failure due to COVID 19. EXAM: PORTABLE CHEST 1 VIEW COMPARISON:  Single-view of the chest earlier today and 05/02/2020. FINDINGS: Tip of the patient's right PICC now projects over the neck of the right clavicle. The catheter tip is likely in the distal most right internal jugular vein. Support apparatus is otherwise unchanged. Near complete whiteout of the chest persists. IMPRESSION: Right PICC remains malpositioned with its tip now projecting over the neck of the right clavicle, likely in the distal right internal jugular vein. No other change. Electronically Signed   By: Drusilla Kanner M.D.   On: 05/03/2020 12:19      Medications:     Scheduled Medications: . sodium chloride   Intravenous Once  . acetaZOLAMIDE  250 mg Per Tube BID  . bethanechol  10 mg Per Tube TID  . carvedilol  25 mg Per Tube BID WC  . chlorhexidine  15 mL Mouth Rinse BID  . Chlorhexidine Gluconate Cloth  6 each Topical Daily  . chlorpheniramine-HYDROcodone  5 mL Per Tube Q12H  . clonazePAM  1 mg Oral BID  . dextromethorphan  30 mg Per Tube TID  . diltiazem  30 mg Oral Q12H  . docusate  100 mg Per Tube BID  . doxazosin  2 mg Oral Daily  . feeding supplement  237 mL Oral TID BM  . feeding supplement (PIVOT 1.5 CAL)  1,000 mL Per Tube Q24H  . fiber  1 packet Per Tube BID  . furosemide  40 mg Intravenous BID  . gabapentin  300 mg Per Tube Q8H  . insulin aspart  0-20 Units Subcutaneous Q4H  . insulin aspart  4 Units Subcutaneous 3 times per day  . insulin detemir  25 Units Subcutaneous Daily  . insulin detemir  40 Units Subcutaneous QHS  . ipratropium-albuterol  3 mL Nebulization BID  . liver oil-zinc oxide   Topical 5 X Daily  . mouth rinse  15 mL Mouth Rinse q12n4p  . melatonin  5 mg Oral QHS  . metoCLOPramide (REGLAN) injection  10 mg Intravenous Q8H  . pantoprazole sodium  40 mg Per Tube QHS  . QUEtiapine  50 mg Oral QHS  . rosuvastatin  40 mg Oral Daily  . sennosides  10 mL Per Tube QHS  . sertraline  25 mg Oral Daily  . sodium chloride flush  10-40 mL Intracatheter Q12H    Infusions: . sodium chloride    . sodium chloride Stopped (04/29/20 1611)  . albumin human Stopped (04/23/20 0041)  . bivalirudin (ANGIOMAX) infusion 0.5 mg/mL (Non-ACS indications) 0.087 mg/kg/hr (05/04/20 0700)    PRN Medications: Place/Maintain arterial line **AND** sodium chloride, acetaminophen (TYLENOL) oral liquid 160 mg/5 mL, albumin human, [DISCONTINUED] lidocaine **AND** albuterol, clonazePAM, Gerhardt's butt cream, guaiFENesin, hydrALAZINE, labetalol, lip balm, ondansetron (ZOFRAN) IV, oxyCODONE, phenol, polyethylene glycol,  promethazine-codeine, simethicone, sodium chloride, sodium chloride flush   Assessment/Plan   1. Acute hypoxemic respiratory failure: Due to COVID-19 PNA with bilateral infiltrates.  Refractory hypoxemia, VV-ECMO cannulation on 03/21/20 with improvement in oxygenation.  Developed left PTX post-subclavian CVL and had left chest tube,  the left lung is re-expanded and CT out.  He was extubated 1/2 but reintubated 1/4 with agitation and suspected aspiration.  Tracheostomy 1/6.  CTA chest 1/22 with suspected chronic PEs and ARDS. ECMO cannula repositioned 1/5. ECMO circuit changed 1/26. Extubated to HFNC on 2/5. ECMO circuit changed and cannula repositioned 2/22.  LDH stable. Sweep at 4, have struggled with hypercarbia from significant dead space ventilation.  Now off abx.  I/Os generally even with IV Lasix + acetazolamide.  - Pre and post-oxygenator gases with minimal change in PaCO2 but expected change in PaO2, sweep up to 8.  Discussed with team, will change circuit today.  - Continue acetazolamide 250 mg bid and Lasix 40 mg IV bid today.   - Patient has had remdesivir, tocilizumab. - Completed steroid taper. - Continue bivalirudin, goal PTT 65-80.  PTT 62. Discussed dosing with PharmD personally. - Continue to mobilize. - Coreg + low dose diltiazem continues at 25 mg bid to increase fractional flow via ECMO circuit.  2. 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  Clot noted on TEE in RV as well.  TTE 1/2 showed normal EF 60-65%, RV improved (mildly dilated/dysfunctional). TEE on 1/5 with moderate to severe RV dysfunction but patient was hypoxemic.  Had 1/2 dose TPA on 1/5. Echo 1/20 with mildly dilated/mildly dysfunctional RV. CTA chest 1/22 with chronic-appearing PEs in upper lobes. - PTT 62. Bivalirudin for goal PTT 65-80. Discussed dosing with PharmD personally. 3. Left PTX: Left chest tube, lung is re-expanded. Tube now out, stable  CXR. 4. Shock: Suspect septic/distributive.  Now resolved, off NE.  5. Anemia: Hgb 9, transfuse < 7.5.  6. AKI: Resolved 7. Hyperglycemia: insulin.  8. HTN: Controlled.     - Continue Coreg 25 mg bid.   - Diltiazem 30 bid.  9. CHB: Episode of CHB when hypoxemic and with cough (suspect vagal).  NSR since then.   He has occasional short vagal episodes.  - Continue Coreg/diltiazem, watch rhythm.  10. Thrombocytopenia: Resolved  11. FEN: Continue tube feeds.  12. Ischemic digits: LUE.  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  Repeat study 1/17 showed no obstruction.  - Wound Care following. 13. ID: Group F Strep and Enterococcus faecalis in sputum. Initially completed abx for these bacteria. Now with recurrent sepsis, Enterococcus faecalis in blood now. Vancomycin restarted and continued to 2/21. TEE 2/7 with no vegetation. Currently no abx.  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   - resolved 15. Hypernatremia: Resolved. Continue free water. No change 16. Depression: Started sertraline 25 mg daily.   CRITICAL CARE Performed by: Marca Ancona  Total critical care time: 35 minutes  Critical care time was exclusive of separately billable procedures and treating other patients.  Critical care was necessary to treat or prevent imminent or life-threatening deterioration.  Critical care was time spent personally by me on the following activities: development of treatment plan with patient and/or surrogate as well as nursing, discussions with consultants, evaluation of patient's response to treatment, examination of patient, obtaining history from patient or surrogate, ordering and performing treatments and interventions, ordering and review of laboratory studies, ordering and review of radiographic studies, pulse oximetry and re-evaluation of patient's condition.    Length of  Stay: 74  Marca Ancona, MD  05/04/2020, 7:38 AM  Advanced Heart Failure Team Pager [phone] (M-F; 7a - 4p)  Please contact [facility] for night-coverage after hours (4p -7a ) and weekends on [email or web address]

## 2020-05-04 NOTE — Progress Notes (Signed)
Nutrition Follow-up  DOCUMENTATION CODES:   Not applicable  INTERVENTION:   Liberalize diet to Regular  Continue Ensure Enlive po TID, each supplement provides 350 kcal and 20 grams of protein  Continue Nocturnal Supplemental TF x 12 hours:  Pivot 1.5 at 60 ml/hr Provides 1080 kcals, 68 g of protein   NUTRITION DIAGNOSIS:   Increased nutrient needs related to acute illness,catabolic illness (COVID-19 infection) as evidenced by estimated needs.  Being addressed via supplemental TF, supplements  GOAL:   Patient will meet greater than or equal to 90% of their needs  Progressing  MONITOR:   Vent status,TF tolerance,Labs,Weight trends  REASON FOR ASSESSMENT:   LOS Enteral/tube feeding initiation and management  ASSESSMENT:   50 y.o. male with no significant medical history. He presented to the ED with dyspnea, cough, and N/V. He reported that his symptoms began with cough 1 week ago. He tried OTC meds but nothing helped. Symptoms worsened to include body aches, fatigue, and N/V 3 days later. He went to his PCP 12/26 and got tested for COVID; he was positive.   12/26 COVID+  12/28 Admitted to [facility] 12/29 Intubated 12/30 Transferred to [facility], VV ECMO cannulation, L PTX with Chest tube insertion 12/31 Cortrak placed, Post-pyloric  1/02 Extubated to HFNC/BiPap as needed, ECHO with EF 60-65% 1/06 TEE for ECMO cannula position, Re-Intubated, Rhinorockets placed for epistaxis, Cortrak malpositioned-repositioned and now gastric per xray 1/07 Trach placed 1/14 Cortrak advanced to post pyloric position 1/16 L. Chest tube removed 1/22 CT chest: bilateral UL PE 1/23 Bronch showed semi occlusive ?mass/polyp in trachea 1/24 Bronch showed resolution of 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 1/26 ECMO Circuit exchanged 2/03 Trach exchange 2/05 Extubated to HFNC 2/07 TEE with no vegetation, EF 55-60% 2/14 Cortrak replaced, post-pyloric 2/15 Changed to nocturnal TF  2/16 Thoracentesis with 500 mL  2/21  Cannula adjustment, circuit change  Pt alert, sitting in chair, finishing lunch on visit today; getting ready to work with PR  PO intake waxes and wanes. Noted pt eating well currently per RN but pt ate minimally over the weekend due to significant fatigue/weakness, and recorded po intake only 0-10% of meals. Observed lunch tray today and pt ate 1/2 chicken salad sandwich, soup and most of banana. Pt ate all of breakfast except for eggs.   Pt drinking Ensure supplements well; drinking 3 per day  Given irregular po intake, recommend continuing nocturnal supplemental TF for now and continue to monitor po intake. Pt is agreeable with this plan. Discussed with RN and ECMO  Nocturnal TF of pivot 1.5 at 60 ml/hr x 12 hours continues  Pt's diet changed to Carb Modified on 2/18; po intake remains variable and inadequate. Noted CBGs still poorly controlled, 189-305 in last 24 hours. Recommend liberalizing diet back to Regular to promote po intake, in hopes of being able to stop TF, given the variable po intake likely more of a culprit in the CBG fluctuations. Also noted pt drinking lots of juice throughout the day which is likely contributing to this as well  Per RN, wounds improving   Labs: CBGs 189-247, sodium 134 Meds: colace, lasix, nutrisource fiber, ss novolog, novolog q 4 hours, levemir, reglan  Diet Order:   Diet Order            Diet regular Room service appropriate? Yes; Fluid consistency: Thin  Diet effective now                 EDUCATION NEEDS:   Not appropriate for  education at this time  Skin:  Skin Assessment: Skin Integrity Issues: Skin Integrity Issues:: Stage II,Other (Comment) DTI: buttocks, forhead Stage II: anus, coccyx Other: ischemic changes to LUE, large bulla on L hand that ruptured  Last BM:  2/22  Height:   Ht Readings from Last 1 Encounters:  02/24/2020 5\' 4"  (1.626 m)    Weight:   Wt Readings from Last 1 Encounters:  05/04/20 78.8 kg    Ideal  Body Weight:  59.1 kg  BMI:  Body mass index is 29.82 kg/m.  Estimated Nutritional Needs:   Kcal:  2160-2520 kcals  Protein:  130-150 g  Fluid:  >/= 2 L    05-30-1992 MS, RDN, LDN, CNSC Registered Dietitian III Clinical Nutrition RD Pager and On-Call Pager Number Located in [place]

## 2020-05-04 NOTE — Progress Notes (Signed)
ANTICOAGULATION CONSULT NOTE  Pharmacy Consult for bivalirudin Indication: ECMO and VTE  Labs: Recent Labs    05/02/20 0353 05/02/20 0355 05/03/20 0355 05/03/20 0646 05/03/20 1512 05/03/20 1519 05/03/20 2345 05/04/20 0414 05/04/20 1643  HGB 9.2*   < > 8.7*   < > 9.5*   < > 8.5* 9.0* 9.2*  HCT 27.0*   < > 26.7*   < > 28.4*   < > 25.0* 26.4* 28.9*  PLT 227   < > 232  --  225  --   --  230 252  APTT 65*   < > 59*  --  62*  --   --  62* 57*  LABPROT 16.8*  --  17.1*  --   --   --   --  17.2*  --   INR 1.4*  --  1.5*  --   --   --   --  1.5*  --   CREATININE 0.67   < > 0.81  --  0.81  --   --  0.77  --    < > = values in this interval not displayed.    Assessment: 48 yoM admitted with COVID-19 PNA with worsening hypoxia, s/p cannulation for ECMO. Pt was started on IV heparin prior to cannulation due to acute DVTs and possible PE, transitioned to bivalirudin with ECMO. Now s/p tPA on 1/5 and tracheostomy on 1/6. ECMO circuit changed on 05/03/2020  Afternoon aPTT is slightly below goal at 57 seconds, CBC stable.  Goal of Therapy:  aPTT 60-80 seconds   Plan:  -Increase bivalirudin slightly to 0.088 mg/kg/hr (using order-specific wt 72.1kg) -Monitor q12h aPTT/CBC, LDH, and for s/sx of bleeding  Fredonia Highland, PharmD, BCPS, [facility] Clinical Pharmacist [phone] Please check AMION for all [facility] numbers 05/04/2020

## 2020-05-04 NOTE — Plan of Care (Signed)
  Problem: Health Behavior/Discharge Planning: Goal: Ability to manage health-related needs will improve Outcome: Progressing   Problem: Clinical Measurements: Goal: Ability to maintain clinical measurements within normal limits will improve Outcome: Progressing Goal: Will remain free from infection Outcome: Progressing Goal: Diagnostic test results will improve Outcome: Progressing Goal: Respiratory complications will improve Outcome: Progressing Goal: Cardiovascular complication will be avoided Outcome: Progressing   Problem: Activity: Goal: Risk for activity intolerance will decrease Outcome: Progressing   Problem: Coping: Goal: Level of anxiety will decrease Outcome: Progressing   Problem: Pain Managment: Goal: General experience of comfort will improve Outcome: Progressing   Problem: Elimination: Goal: Will not experience complications related to bowel motility Outcome: Progressing Goal: Will not experience complications related to urinary retention Outcome: Progressing   Problem: Safety: Goal: Ability to remain free from injury will improve Outcome: Progressing   Problem: Skin Integrity: Goal: Risk for impaired skin integrity will decrease Outcome: Progressing   Problem: Education: Goal: Knowledge of risk factors and measures for prevention of condition will improve Outcome: Progressing   Problem: Coping: Goal: Psychosocial and spiritual needs will be supported Outcome: Progressing   Problem: Respiratory: Goal: Will maintain a patent airway Outcome: Progressing Goal: Complications related to the disease process, condition or treatment will be avoided or minimized Outcome: Progressing

## 2020-05-04 NOTE — Progress Notes (Signed)
Physical Therapy Treatment Patient Details Name: Alex [name] MRN: [id number] DOB: 1970-09-19 Today's Date: 05/04/2020    History of Present Illness Pt is 50 y.o. male  with no significant past medical history admitted on 08-Apr-2020 with dyspnea, cough, nausea/vomiting ~ 1 week ago with worsening symptoms of body aches and fatigue and +COVID 02/07/20 and admitted with shortness of breath. Required intubation 03/10/20. Cannulated for VV ECMO 02/12/2020. Oxygenating better with ECMO. Also evidence of RLE DVT, RV thrombus, and high suspicion of PE. Has required chest tube to L lung for collapse which needs further reposition with recurrent collapse. Extubated 03-15-19.  Reintubated 1/5 and trach placed 1/6. Decannulated 04/16/20.    PT Comments    Pt is making excellent progress towards his goals today after circuit change yesterday. Pt has progress gait distance and assist level as well as static standing tolerance. Pt is min-modA for sit>stand x3, and min-modAx2 for ambulation of a total of 150 feet in 3 bouts. Continue to work on widening BoS through cuing with walking and resisted clamshells in seated throughout day with direction from RN. At end of ambulation, pt able to stand for an additional 5 minutes working on pelvic rotation anterior/posterior and hip circles L and R for improved mobility. Pt also able to carry on conversation with son on FaceTime in standing. Pt with spontaneous smile today after session. PT will continue to progress and improve gait mechanics.      Follow Up Recommendations  CIR;Supervision/Assistance - 24 hour     Equipment Recommendations  Other (comment) (TBD (pt still on ECMO))       Precautions / Restrictions Precautions Precautions: Fall Precaution Comments: ECMO, NGtube, Foley, Pt decannulated himself 2/4 Restrictions Other Position/Activity Restrictions: L platform walker used for mobility due to discomfort with grip to L hand    Mobility  Bed Mobility                General bed mobility comments: OOB in recliner    Transfers Overall transfer level: Needs assistance Equipment used: Left platform walker Transfers: Sit to/from Stand;Stand Pivot Transfers Sit to Stand: +2 physical assistance;+2 safety/equipment;Min assist;Mod assist         General transfer comment: min-modAx2 for 3x power up into standing from recliner, vc for scooting hips forward and placement of LE  Ambulation/Gait Ambulation/Gait assistance: Min assist;Mod assist;+2 physical assistance Gait Distance (Feet): 150 Feet (2 x 50' , 1x45') Assistive device: Left platform walker Gait Pattern/deviations: Step-to pattern;Step-through pattern;Decreased step length - right;Decreased step length - left;Wide base of support;Narrow base of support;Trunk flexed;Drifts right/left;Shuffle;Decreased weight shift to right;Decreased weight shift to left Gait velocity: slowed   General Gait Details: 3 bouts of ambulation today for about 30 feet each, improved hip rotation and BoS able to progress to step over step pattern without stopping continues to require multimodal cuing for upright posture and wide BoS         Balance Overall balance assessment: Needs assistance Sitting-balance support: No upper extremity supported;Feet unsupported Sitting balance-Leahy Scale: Fair     Standing balance support: Bilateral upper extremity supported;During functional activity Standing balance-Leahy Scale: Poor (requires minimal outside support and is able to tolerate 5 min static standing at the end of walking and carried on conversation with son via Facetime) Standing balance comment: requires outside support to maintain balance in standing  Cognition Arousal/Alertness: Awake/alert Behavior During Therapy: Flat affect Overall Cognitive Status: Within Functional Limits for tasks assessed                                         Exercises General Exercises - Lower Extremity Hip ABduction/ADduction: Seated;Strengthening;AROM;5 reps (light orange resistance band) Other Exercises Other Exercises: standing in RW for 5 min, worked on pelvic rotation forward and back and hip circles, good tolerance for standing today    General Comments General comments (skin integrity, edema, etc.): At rest O2 4L O2 via HFNC, ECMO Sweep 4, for ambulation increased to 8L via HFNC and Sweep of 8      Pertinent Vitals/Pain Pain Assessment: No/denies pain           PT Goals (current goals can now be found in the care plan section) Acute Rehab PT Goals Patient Stated Goal: continue to walk PT Goal Formulation: With patient Time For Goal Achievement: 05/10/20 Potential to Achieve Goals: Fair Progress towards PT goals: Progressing toward goals    Frequency    Min 5X/week      PT Plan Current plan remains appropriate    Co-evaluation PT/OT/SLP Co-Evaluation/Treatment: Yes Reason for Co-Treatment: Complexity of the patient's impairments (multi-system involvement) PT goals addressed during session: Mobility/safety with mobility OT goals addressed during session: Strengthening/ROM      AM-PAC PT "6 Clicks" Mobility   Outcome Measure  Help needed turning from your back to your side while in a flat bed without using bedrails?: A Lot Help needed moving from lying on your back to sitting on the side of a flat bed without using bedrails?: A Lot Help needed moving to and from a bed to a chair (including a wheelchair)?: A Lot Help needed standing up from a chair using your arms (e.g., wheelchair or bedside chair)?: A Lot Help needed to walk in hospital room?: A Lot Help needed climbing 3-5 steps with a railing? : Total 6 Click Score: 11    End of Session Equipment Utilized During Treatment: Gait belt Activity Tolerance: Patient tolerated treatment well Patient left: in chair;with call bell/phone within reach;with  nursing/sitter in room Nurse Communication: Mobility status;Other (comment) (scheduled approx 11:30 for daily therapy) PT Visit Diagnosis: Muscle weakness (generalized) (M62.81);Difficulty in walking, not elsewhere classified (R26.2);Other abnormalities of gait and mobility (R26.89);Unsteadiness on feet (R26.81)     Time: 0177-9390 PT Time Calculation (min) (ACUTE ONLY): 60 min  Charges:  $Gait Training: 23-37 mins                     Elizabeth B. Beverely Risen PT, DPT Acute Rehabilitation Services Pager [phone] Office [phone]    Elon Alas [facility] 05/04/2020, 2:37 PM

## 2020-05-05 ENCOUNTER — Inpatient Hospital Stay (HOSPITAL_COMMUNITY): Payer: Medicaid Other

## 2020-05-05 LAB — POCT I-STAT 7, (LYTES, BLD GAS, ICA,H+H)
Acid-Base Excess: 8 mmol/L — ABNORMAL HIGH (ref 0.0–2.0)
Acid-Base Excess: 7 mmol/L — ABNORMAL HIGH (ref 0.0–2.0)
Acid-Base Excess: 6 mmol/L — ABNORMAL HIGH (ref 0.0–2.0)
Acid-Base Excess: 6 mmol/L — ABNORMAL HIGH (ref 0.0–2.0)
Acid-Base Excess: 5 mmol/L — ABNORMAL HIGH (ref 0.0–2.0)
Bicarbonate: 35.1 mmol/L — ABNORMAL HIGH (ref 20.0–28.0)
Bicarbonate: 35.6 mmol/L — ABNORMAL HIGH (ref 20.0–28.0)
Bicarbonate: 33.8 mmol/L — ABNORMAL HIGH (ref 20.0–28.0)
Bicarbonate: 32.7 mmol/L — ABNORMAL HIGH (ref 20.0–28.0)
Bicarbonate: 32.5 mmol/L — ABNORMAL HIGH (ref 20.0–28.0)
Calcium, Ion: 1.26 mmol/L (ref 1.15–1.40)
Calcium, Ion: 1.26 mmol/L (ref 1.15–1.40)
Calcium, Ion: 1.28 mmol/L (ref 1.15–1.40)
Calcium, Ion: 1.26 mmol/L (ref 1.15–1.40)
Calcium, Ion: 1.24 mmol/L (ref 1.15–1.40)
HCT: 25 % — ABNORMAL LOW (ref 39.0–52.0)
HCT: 29 % — ABNORMAL LOW (ref 39.0–52.0)
HCT: 28 % — ABNORMAL LOW (ref 39.0–52.0)
HCT: 27 % — ABNORMAL LOW (ref 39.0–52.0)
HCT: 26 % — ABNORMAL LOW (ref 39.0–52.0)
Hemoglobin: 8.5 g/dL — ABNORMAL LOW (ref 13.0–17.0)
Hemoglobin: 9.9 g/dL — ABNORMAL LOW (ref 13.0–17.0)
Hemoglobin: 9.5 g/dL — ABNORMAL LOW (ref 13.0–17.0)
Hemoglobin: 9.2 g/dL — ABNORMAL LOW (ref 13.0–17.0)
Hemoglobin: 8.8 g/dL — ABNORMAL LOW (ref 13.0–17.0)
O2 Saturation: 100 %
O2 Saturation: 99 %
O2 Saturation: 100 %
O2 Saturation: 98 %
O2 Saturation: 99 %
Patient temperature: 37
Patient temperature: 36.8
Patient temperature: 36.9
Patient temperature: 36.9
Potassium: 4 mmol/L (ref 3.5–5.1)
Potassium: 5.6 mmol/L — ABNORMAL HIGH (ref 3.5–5.1)
Potassium: 4.7 mmol/L (ref 3.5–5.1)
Potassium: 4.4 mmol/L (ref 3.5–5.1)
Potassium: 4.2 mmol/L (ref 3.5–5.1)
Sodium: 136 mmol/L (ref 135–145)
Sodium: 133 mmol/L — ABNORMAL LOW (ref 135–145)
Sodium: 135 mmol/L (ref 135–145)
Sodium: 134 mmol/L — ABNORMAL LOW (ref 135–145)
Sodium: 134 mmol/L — ABNORMAL LOW (ref 135–145)
TCO2: 37 mmol/L — ABNORMAL HIGH (ref 22–32)
TCO2: 38 mmol/L — ABNORMAL HIGH (ref 22–32)
TCO2: 36 mmol/L — ABNORMAL HIGH (ref 22–32)
TCO2: 34 mmol/L — ABNORMAL HIGH (ref 22–32)
TCO2: 34 mmol/L — ABNORMAL HIGH (ref 22–32)
pCO2 arterial: 62.8 mmHg — ABNORMAL HIGH (ref 32.0–48.0)
pCO2 arterial: 76.2 mmHg (ref 32.0–48.0)
pCO2 arterial: 67.5 mmHg (ref 32.0–48.0)
pCO2 arterial: 59.9 mmHg — ABNORMAL HIGH (ref 32.0–48.0)
pCO2 arterial: 63.4 mmHg — ABNORMAL HIGH (ref 32.0–48.0)
pH, Arterial: 7.356 (ref 7.350–7.450)
pH, Arterial: 7.277 — ABNORMAL LOW (ref 7.350–7.450)
pH, Arterial: 7.307 — ABNORMAL LOW (ref 7.350–7.450)
pH, Arterial: 7.344 — ABNORMAL LOW (ref 7.350–7.450)
pH, Arterial: 7.317 — ABNORMAL LOW (ref 7.350–7.450)
pO2, Arterial: 187 mmHg — ABNORMAL HIGH (ref 83.0–108.0)
pO2, Arterial: 146 mmHg — ABNORMAL HIGH (ref 83.0–108.0)
pO2, Arterial: 190 mmHg — ABNORMAL HIGH (ref 83.0–108.0)
pO2, Arterial: 114 mmHg — ABNORMAL HIGH (ref 83.0–108.0)
pO2, Arterial: 146 mmHg — ABNORMAL HIGH (ref 83.0–108.0)

## 2020-05-05 LAB — CBC
HCT: 27.5 % — ABNORMAL LOW (ref 39.0–52.0)
HCT: 28.6 % — ABNORMAL LOW (ref 39.0–52.0)
Hemoglobin: 8.7 g/dL — ABNORMAL LOW (ref 13.0–17.0)
Hemoglobin: 9.1 g/dL — ABNORMAL LOW (ref 13.0–17.0)
MCH: 29.9 pg (ref 26.0–34.0)
MCH: 30.4 pg (ref 26.0–34.0)
MCHC: 31.6 g/dL (ref 30.0–36.0)
MCHC: 31.8 g/dL (ref 30.0–36.0)
MCV: 94.5 fL (ref 80.0–100.0)
MCV: 95.7 fL (ref 80.0–100.0)
Platelets: 230 10*3/uL (ref 150–400)
Platelets: 260 10*3/uL (ref 150–400)
RBC: 2.91 MIL/uL — ABNORMAL LOW (ref 4.22–5.81)
RBC: 2.99 MIL/uL — ABNORMAL LOW (ref 4.22–5.81)
RDW: 16.2 % — ABNORMAL HIGH (ref 11.5–15.5)
RDW: 16.1 % — ABNORMAL HIGH (ref 11.5–15.5)
WBC: 9.4 10*3/uL (ref 4.0–10.5)
WBC: 11.2 10*3/uL — ABNORMAL HIGH (ref 4.0–10.5)
nRBC: 0 % (ref 0.0–0.2)
nRBC: 0 % (ref 0.0–0.2)

## 2020-05-05 LAB — BASIC METABOLIC PANEL
Anion gap: 9 (ref 5–15)
Anion gap: 8 (ref 5–15)
BUN: 32 mg/dL — ABNORMAL HIGH (ref 6–20)
BUN: 31 mg/dL — ABNORMAL HIGH (ref 6–20)
CO2: 32 mmol/L (ref 22–32)
CO2: 31 mmol/L (ref 22–32)
Calcium: 8.8 mg/dL — ABNORMAL LOW (ref 8.9–10.3)
Calcium: 8.8 mg/dL — ABNORMAL LOW (ref 8.9–10.3)
Chloride: 96 mmol/L — ABNORMAL LOW (ref 98–111)
Chloride: 94 mmol/L — ABNORMAL LOW (ref 98–111)
Creatinine, Ser: 0.7 mg/dL (ref 0.61–1.24)
Creatinine, Ser: 0.7 mg/dL (ref 0.61–1.24)
GFR, Estimated: >60 mL/min (ref >60)
GFR, Estimated: >60 mL/min (ref >60)
Glucose, Bld: 136 mg/dL — ABNORMAL HIGH (ref 70–99)
Glucose, Bld: 236 mg/dL — ABNORMAL HIGH (ref 70–99)
Potassium: 3.9 mmol/L (ref 3.5–5.1)
Potassium: 4.3 mmol/L (ref 3.5–5.1)
Sodium: 137 mmol/L (ref 135–145)
Sodium: 133 mmol/L — ABNORMAL LOW (ref 135–145)

## 2020-05-05 LAB — TYPE AND SCREEN
ABO/RH(D): B POS
Antibody Screen: NEGATIVE
Unit division: 0
Unit division: 0
Unit division: 0
Unit division: 0
Unit division: 0

## 2020-05-05 LAB — APTT
aPTT: 59 seconds — ABNORMAL HIGH (ref 24–36)
aPTT: 61 seconds — ABNORMAL HIGH (ref 24–36)

## 2020-05-05 LAB — BPAM RBC
Blood Product Expiration Date: 202203142359
Blood Product Expiration Date: 202203162359
Blood Product Expiration Date: 202203162359
Blood Product Expiration Date: 202203162359
Blood Product Expiration Date: 202203172359
ISSUE DATE / TIME: 202202210835
ISSUE DATE / TIME: 202202210835
ISSUE DATE / TIME: 202202210835
ISSUE DATE / TIME: 202202210835
Unit Type and Rh: 7300
Unit Type and Rh: 7300
Unit Type and Rh: 7300
Unit Type and Rh: 7300
Unit Type and Rh: 7300

## 2020-05-05 LAB — GLUCOSE, CAPILLARY
Glucose-Capillary: 115 mg/dL — ABNORMAL HIGH (ref 70–99)
Glucose-Capillary: 130 mg/dL — ABNORMAL HIGH (ref 70–99)
Glucose-Capillary: 186 mg/dL — ABNORMAL HIGH (ref 70–99)
Glucose-Capillary: 194 mg/dL — ABNORMAL HIGH (ref 70–99)
Glucose-Capillary: 223 mg/dL — ABNORMAL HIGH (ref 70–99)
Glucose-Capillary: 229 mg/dL — ABNORMAL HIGH (ref 70–99)
Glucose-Capillary: 254 mg/dL — ABNORMAL HIGH (ref 70–99)

## 2020-05-05 LAB — FIBRINOGEN: Fibrinogen: 321 mg/dL (ref 210–475)

## 2020-05-05 LAB — LACTIC ACID, PLASMA
Lactic Acid, Venous: 0.8 mmol/L (ref 0.5–1.9)
Lactic Acid, Venous: 1.1 mmol/L (ref 0.5–1.9)

## 2020-05-05 LAB — LACTATE DEHYDROGENASE: LDH: 324 U/L — ABNORMAL HIGH (ref 98–192)

## 2020-05-05 LAB — PROTIME-INR
INR: 1.4 — ABNORMAL HIGH (ref 0.8–1.2)
Prothrombin Time: 17 seconds — ABNORMAL HIGH (ref 11.4–15.2)

## 2020-05-05 MED ORDER — POTASSIUM CHLORIDE 20 MEQ PO PACK
40.0000 meq | PACK | Freq: Once | ORAL | Status: AC
  Administered 2020-05-05: 40 meq
  Filled 2020-05-05: qty 2

## 2020-05-05 NOTE — Progress Notes (Signed)
Critical Care Progress Note  NAME:  Alex [name], MRN:  [id number], DOB:  03/20/1970, LOS: 57 ADMISSION DATE:  March 29, 2020, CONSULTATION DATE: 03/04/2020 REFERRING MD: Wynona Neat -LBPCCM, CHIEF COMPLAINT: Respiratory failure requiring ECMO  HPI/course in hospital  50 year old man admitted to hospital 12/28 with 1 week history of dyspnea cough nausea and vomiting.  Initially admitted to [facility] long hospital and placed on high flow nasal cannula but rapidly failed and required intubation 12/29.  Persistent hypoxic respiratory failure with PF ratio 55 in spite of 18 of PEEP FiO2 0.1 despite paralytics.  Did not improve with prone ventilation  Cannulated for VV ECMO 12/30 via right IJ crescent cannula.  ECMO circuit was changed on 04/07/2020 Iatrogenic pneumothorax from left subclavian triple-lumen placement  12/28 admitted covid, ARDS 12/30 ECMO cannulation, iatrogenic pneumothrorax, DVT, Bivalrudin started           Left subclavian hematoma, chest tube, ceftriaxone/ azithromycin started 12/31 1Unit PRBC 1/1 Palliative consult, noted HTN, fentanyl only 1/2 Extubation I/O positive, left lung re-expanded, EF 60-65%, Lasix 20mg  BID 1/3 BiPAP in place, HTN to 190s, 200s, labetolol for BP, I/O even, cefepime and vancomycin started 1/4 agitation off BiPAP, possible aspiration 1/5 agitation overnight, reintubated      ECHO with RV dilation, ECMO cannula repositioned, LUE DVT (+), 1/2 dose tPA given,      I/O up, on lasix, Epi, NE, vasopressin started, HR/ rhythm issues noted 1/6 Tracheostomy, hypoglycemic when off tube feeds       Cortrack tune issues 1/7 on lasix gtt 4mg /hr with diuresis, agitation improved 1/8 starting steroid taper 1/9 severe agitation, heavy sedation required, lasix gtt 6mg /hr, I/Os (-)       precedex and fentanyl 1/10 Vanc stopped 1/11 sweep to 2, lasix gtt at 4mg /hr, weight up, metoprolol added for HTN 1/12 fevers to 99 noted, lasix gtt and metazolone 1/13 lasix gtt  dced 1/14 intermittent HTN, possibly flash pulmonary edema tied to sedation        Ischemic left hand changes, 1 Unit PRBCs 1/15 1/16 sweep from 4 to 2.5, chest tube removed 1/17 sweep at 6, trial of nebulized morphine for cough, LUE dopplers show no obstruction 1/18 two events of 2nd degree type II HB noted with coughing         sweep at 4, IV lasix 40, acetazolamide, distal XLT trach placement         agitation and BP spikes 1/19 agitation, air hunger, seroquel, klonopin, oxy, valproate, ketamine trial 1/20 sweep at 3.5, diuresis results in chugging, albumin given         1 Unit PRBCs, lidocaine nebulizers for cough, ancef started 1/21 sweep to 8, anxiety, I/Os (+) with lasix and acetazolamide 1/22 sweep at 5, chest CT bilateral upper lobe PEs 1/23 sweep at 5, delirium, I/Os even with lasix and acetazolamide         continued coughing, 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 1/24 sweep at 7.5, 1 Unit PRBCs, repeat bronchoscopy showed resolution of 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 1/25 sweep at 7.5, on propofol, lasix gtt at 67ml/hr, I/Os (+)         nebulized morphine and lidocaine for cough 1/26 sweep at 8, propofol/ precedex, cough present when weaned, lasix gtt at 22ml/hr        ECMO circuit changes, levophed started, 1 Unit PRBCs 1/27 sweep at 4, off sedation, delirium, I/Os even        lasix gtt restated, acetazolamide overnight 1/28 sweep at 7.5,  HTN labile 1/29 sweep of 6, patient reports a tickle in throat, cetacaine 1/30 sweep down to 6, cough improved with cetacaine and gabapentin 1/31 sweep at 5, agitation, off acetazolamide, lasix, weight up 2/1 cough, of pulmicort, cetacaine for cough, continued gabapentin, dexmethorphan       CT demonstrated trach placement against back of trachea 2/2 Trach collar trial somewhat effective in managing cough       low dose diltiazem for HR,  2/3 sweep at 4,trach cannula replacement (longer, more flexible bivona); brochoscopy  shows irritated mucosa       IV lasix and acetazolamide 2/4 sweep at 5, I/Os (+), BUN elevated, lasix gtt with diamox,       trach decannulation, increased gabapentin for cough 2/5 start HCAP coverage for rising WBC/temps, check Pct/cultures, CXR worse with lack of PEEP, diuresed well but started getting very hypotensive, sepsis vs. Too dry 04/18/20 blood cx grew E faecalis 2/7 PICC change, TEE 2/7>> sweep weans 2/22 Cannula adjustment, circuit change  Past Medical History  none  Interim history/subjective:  No events. He is lonely. Slept well.  Objective   Blood pressure (!) 109/57, pulse (!) 109, temperature 98 F (36.7 C), temperature source Oral, resp. rate (!) 32, height 5\' 4"  (1.626 m), weight 78.1 kg, SpO2 97 %.        Intake/Output Summary (Last 24 hours) at 05/05/2020 0753 Last data filed at 05/05/2020 0600 Gross per 24 hour  Intake 1451.87 ml  Output 2550 ml  Net -1098.13 ml   Filed Weights   05/03/20 0600 05/04/20 0500 05/05/20 0600  Weight: 77.9 kg 78.8 kg 78.1 kg    Examination: Constitutional: no acute distress sitting up in chair  Eyes: EOMI, pupils equal, facial plethora stable Ears, nose, mouth, and throat: MMM, prior trach site CDI Cardiovascular: RRR, ext warm Respiratory: rhonci, worse on left, shallow inspiratory efforts Gastrointestinal: soft, protuberant, +BS Skin: No rashes, normal turgor, stable ischemic changes of L hand Neurologic: moves all 4 ext to command Psychiatric: RASS 0 Ext: 2+ pitting edema  Sodium better Labs all look good  Assessment & Plan:  Acute hypoxemic/hypercapnic respiratory failure due to severe ARDS from COVID-19 pneumonia Probable acute PE, and RV dysfunction s/p TPA Refractory coughing- improved after trach removal but still an issue intermittently Strep group F/ enterococcal pneumonia- s/p 10 days vanc 1/29; recurrent with bacteremia on 2/5 now s/p vanc x 14+ days R effusion- lymphocyte predominant transudate  drained 2/16 - Continue VV ECMO, sweep weans titrated to WOB/ABG - Cough regimen as ordered - Continue supplemental oxygen via nasal cannula - IS, flutter, OOB mobility to prevent muscle atrophy - Keep even with diuresis: lasix and acetazolamide BID, doing well with this - Today: slow sweep wean (now down to 3.5LPM with rest, 5-6 with activity), work on mobility, keep even fluidwise  HTN.  Trials of reducing HR/BP have resulted in reduced pCO2 (likely related to fractional flow through ECMO circuit) -Continue coreg, taper clonidine (to end 2/20), continue dilt; goal to keep lower MAPs and HR to increase fractional circuit flow, no changes here  Hyponatremia- improving with diureiss  Acute delirium, with agitation- improved. -Pain control with PRN oxycodone -Gabapentin and klonipin decreased today -Seroquel qHS at current dose working well  Sepsis secondary to E faecalis bacteremia- secondary to PNA, has grown enterococcus in sputum prior, previous vanc course completed 1/29. TEE on 2/7 without vegetations. PICC changed on 2/7.  14 day vanc therapy from PICC change completed 2/21.  Gastroparesis with  some element ileus: improved but occasional vomiting spells with coughing attacks.  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  - wound care following, appreciate dressing recommendations - keep A-line out of left arm   R arm swelling- duplex neg, NTD here, going to space out cuff measurements since we have arterial line  Hyperglycemia due to diabetes type II -Basal bolus insulin adjusted, goal CBG 100-180, more or less at goal with current regimen  Acute urinary retention- recurrent issue -increased cardura 2/22 -check intermittent bladder scans PRN -does better standing with this, hopefully does not need another  foley  Deconditioning -con't working with PT, OT, SLP, appreciate help  Destination of therapy- does not have social support for transplant.  Need to discuss this further if we continue to plateau.  Daily Goals Checklist  Pain/Anxiety/Delirium protocol (if indicated): see above VAP protocol (if indicated):  n/a DVT prophylaxis: bivalirudin GI prophylaxis: pantoprazole Glucose control: basal bolus insulin Mobility/therapy needs: Mobilization as tolerated Code Status: Full code Disposition: ICU  Patient critically ill due to ARDS Interventions to address this today ECMO titration Risk of deterioration without these interventions is high  I personally spent 36 minutes providing critical care not including any separately billable procedures  3/22 MD [facility] Prefer epic messenger for cross cover needs If after hours, please call E-link

## 2020-05-05 NOTE — Progress Notes (Signed)
Physical Therapy Treatment Patient Details Name: Alex [name] MRN: [id number] DOB: 1970-10-30 Today's Date: 05/05/2020    History of Present Illness Pt is 50 y.o. male  with no significant past medical history admitted on 02/21/2020 with dyspnea, cough, nausea/vomiting ~ 1 week ago with worsening symptoms of body aches and fatigue and +COVID 02/07/20 and admitted with shortness of breath. Required intubation 03/10/20. Cannulated for VV ECMO 03-23-20. Oxygenating better with ECMO. Also evidence of RLE DVT, RV thrombus, and high suspicion of PE. Has required chest tube to L lung for collapse which needs further reposition with recurrent collapse. Extubated 03-15-19.  Reintubated 1/5 and trach placed 1/6. Decannulated 04/16/20.    PT Comments    Pt on BSC on arrival, PT assist for therapeutic static standing in RW during pericare of approximately 5 min. Pt rest in recliner while team managed equipment for ambulation. Once in hallway pt able to ambulate a total of approximately 100 feet with RW. In first bout of walking pt with independent management of RW and min-modA posterior assist. Pt with increased fatigue with RW management requiring increased recovery time. With subsequent bouts of ambulation, pt provided assist for RW management and able to perform 2 more bouts of ambulation. Of note pt with sustained attention with ambulation, when BP cuff inflater went off pt ambulation fell apart and he could not advance until it was turned off. Per physician notes goal of lung transplant is no longer an option. Will continue to work with pt daily to progress mobility with apparent goal weaning from ECMO.     Follow Up Recommendations  CIR;Supervision/Assistance - 24 hour     Equipment Recommendations  Other (comment) (TBD (pt still on ECMO))       Precautions / Restrictions Precautions Precautions: Fall Precaution Comments: ECMO, NGtube, Foley, Pt decannulated himself 2/4 Restrictions Other  Position/Activity Restrictions: L platform walker used for mobility due to discomfort with grip to L hand    Mobility  Bed Mobility               General bed mobility comments: OOB in recliner    Transfers Overall transfer level: Needs assistance Equipment used: Left platform walker Transfers: Sit to/from Stand;Stand Pivot Transfers Sit to Stand: +2 physical assistance;+2 safety/equipment;Min assist;Mod assist         General transfer comment: min-modAx2 for 2x power up from [facility], and 3x power up from recliner, continues to need cuing for hand and feet placement and glute activation to come into fully upright, by end of session needing heavy modA  Ambulation/Gait Ambulation/Gait assistance: Min assist;Mod assist;+2 physical assistance Gait Distance (Feet): 100 Feet (3 bouts 1x 40', 1x30', and 1x 30') Assistive device: Left platform walker Gait Pattern/deviations: Step-to pattern;Step-through pattern;Decreased step length - right;Decreased step length - left;Wide base of support;Narrow base of support;Trunk flexed;Drifts right/left;Shuffle;Decreased weight shift to right;Decreased weight shift to left Gait velocity: slowed Gait velocity interpretation: <1.31 ft/sec, indicative of household ambulator General Gait Details: pt limited by fatigue today after use of BSC, pt able to repeat goal of wide BoS, vc for upright posture and proximity to RW to maximize use of UE strength, initial bout of walking trialed pt independence with RW management with min A for posterior support at the hips, obvious requires increased energy reserves to accomplish, with subsequent bouts assist provided for RW management anteriorly       Balance Overall balance assessment: Needs assistance Sitting-balance support: No upper extremity supported;Feet unsupported Sitting balance-Leahy Scale: Fair  Standing balance support: Bilateral upper extremity supported;During functional activity Standing  balance-Leahy Scale: Poor (pt able to static stand for pericare for approximately 5 min with contact guard support) Standing balance comment: requires outside support to maintain balance in standing                            Cognition Arousal/Alertness: Awake/alert Behavior During Therapy: Flat affect Overall Cognitive Status: Within Functional Limits for tasks assessed                                           General Comments General comments (skin integrity, edema, etc.): Pt requies 8L supplemental O2 via HFNC, and Sweep of 8 however pt with increased lethargy at end of walking bouts requiring vc for purse lip breathing for approximately 3-4 min for recovery      Pertinent Vitals/Pain Pain Assessment: Faces Faces Pain Scale: Hurts little more Pain Location: L fingers Pain Descriptors / Indicators: Discomfort;Grimacing;Guarding Pain Intervention(s): Monitored during session           PT Goals (current goals can now be found in the care plan section) Acute Rehab PT Goals Patient Stated Goal: continue to walk PT Goal Formulation: With patient Time For Goal Achievement: 05/10/20 Potential to Achieve Goals: Fair Progress towards PT goals: Progressing toward goals    Frequency    Min 5X/week      PT Plan Current plan remains appropriate       AM-PAC PT "6 Clicks" Mobility   Outcome Measure  Help needed turning from your back to your side while in a flat bed without using bedrails?: A Lot Help needed moving from lying on your back to sitting on the side of a flat bed without using bedrails?: A Lot Help needed moving to and from a bed to a chair (including a wheelchair)?: A Lot Help needed standing up from a chair using your arms (e.g., wheelchair or bedside chair)?: A Lot Help needed to walk in hospital room?: A Lot Help needed climbing 3-5 steps with a railing? : Total 6 Click Score: 11    End of Session Equipment Utilized During  Treatment: Gait belt Activity Tolerance: Patient tolerated treatment well Patient left: in chair;with call bell/phone within reach;with nursing/sitter in room Nurse Communication: Mobility status;Other (comment) (scheduled approx 11:30 for daily therapy) PT Visit Diagnosis: Muscle weakness (generalized) (M62.81);Difficulty in walking, not elsewhere classified (R26.2);Other abnormalities of gait and mobility (R26.89);Unsteadiness on feet (R26.81)     Time: 2956-2130 PT Time Calculation (min) (ACUTE ONLY): 70 min  Charges:  $Gait Training: 38-52 mins $Therapeutic Activity: 23-37 mins                     Mykaylah Ballman B. Beverely Risen PT, DPT Acute Rehabilitation Services Pager [phone] Office [phone]    Elon Alas [facility] 05/05/2020, 1:36 PM

## 2020-05-05 NOTE — Plan of Care (Signed)

## 2020-05-05 NOTE — Procedures (Signed)
Extracorporeal support note  ECLS cannulation date: 02/18/2020 Last circuit change: 05/03/20  Indication: Acute hypoxic respiratory failure due to ARDS from COVID-19 pneumonia with RV dysfunction.   Configuration: Venovenous  Drainage cannula: 32 French crescent cannula via right IJ Return cannula: Same  Pump speed: 3400 RPM Pump flow: 4.73 L/min Pump used: Cardio help  Oxygenator: Cardio help O2 blender: 100% Sweep gas: 3.5 L  Circuit check: no clots Anticoagulant: Bivalirudin Anticoagulation targets: PTT 60-80  Changes in support:  - Sweep wean, balanced diuresis  Anticipated goals/duration of support: Bridge to recovery   Multidisciplinary ECMO rounds completed.  Lorin Glass, MD 05/05/20 7:51 AM [facility]

## 2020-05-05 NOTE — Progress Notes (Signed)
ANTICOAGULATION CONSULT NOTE  Pharmacy Consult for bivalirudin Indication: ECMO and VTE  Labs: Recent Labs    05/03/20 0355 05/03/20 0646 05/04/20 0414 05/04/20 1643 05/04/20 1646 05/04/20 2001 05/05/20 0407 05/05/20 0408  HGB 8.7*   < > 9.0* 9.2*   < > 9.9* 8.7* 8.5*  HCT 26.7*   < > 26.4* 28.9*   < > 29.0* 27.5* 25.0*  PLT 232   < > 230 252  --   --  230  --   APTT 59*   < > 62* 57*  --   --  59*  --   LABPROT 17.1*  --  17.2*  --   --   --  17.0*  --   INR 1.5*  --  1.5*  --   --   --  1.4*  --   CREATININE 0.81   < > 0.77 0.67  --   --  0.70  --    < > = values in this interval not displayed.    Assessment: 51 yoM admitted with COVID-19 PNA with worsening hypoxia, s/p cannulation for ECMO. Pt was started on IV heparin prior to cannulation due to acute DVTs and possible PE, transitioned to bivalirudin with ECMO. Now s/p tPA on 1/5 and tracheostomy on 1/6. ECMO circuit changed on 05/03/2020  Afternoon aPTT is slightly below goal at 59 seconds, CBC stable.  Goal of Therapy:  aPTT 60-80 seconds   Plan:  -Increase bivalirudin slightly to 0.09 mg/kg/hr (using order-specific wt 72.1kg) -Monitor q12h aPTT/CBC, LDH, and for s/sx of bleeding  Harland German, PharmD Clinical Pharmacist **Pharmacist phone directory can now be found on [email or web address] ([email or web address]).  Listed under [facility].

## 2020-05-05 NOTE — Progress Notes (Signed)
Patient ID: Alex [name], male   DOB: [birth year], 50 y.o.   MRN: [id number]     Advanced Heart Failure Rounding Note  PCP-Cardiologist: No primary care provider on file.   Subjective:    - 12/30: VV ECMO cannulation - 12/31: Left chest tube replaced - 1/2: Extubated. Echo with EF 60-65%, mildly dilated RV with mildly decreased systolic function.  - 1/4: Agitated, suspected aspiration.  Re-intubated.  - 1/5: ECMO cannula repositioned under TEE guidance. TEE showed moderately dilated/moderate-severely dysfunctional RV in setting of hypoxemia. LUE DVT found.  Patient got 1/2 dose of TPA due to initial concern for large PE.  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  - 1/6: Tracheostomy - 1/7: Echo with mild RV dilation/mild RV dysfunction.  - 1/16: Left chest tube out - 1/17: LUE arterial dopplers repeated, showed no obstruction.  - 1/20: Echo with EF 65-70%, mildly D-shaped septum, mildly dilated and mildly dysfunctional RV.  - 1/22: CTA chest: Bilateral upper lobe PEs (suspect chronic), changes c/w ARDS - 1/23: Bronchoscopy showed semi-occlusive ?mass/polyp in the trachea.  - 1/24: Bronchoscopy showed resolution of 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 - 1/26: ECMO circuit changed.  - 2/1: CT chest showed diffuse bronchiectasis as well as diffuse opacity consistent with COVID-19 PNA with ARDS. - 2/3: Trach exchange - 2/5: Decannulated to HFNC - 2/6: Enterococcal PNA/bacteremia.  Echo showed EF 65-70%, normal-appearing RV, no vegetation noted.  - 2/7: TEE with no vegetation, EF 55-60%, RV low normal function with normal size.  - 2/16: Right thoracentesis 500 cc - 2/22: ECMO circuit changed.  ECMO cannula repositioned under TEE guidance. TEE with EF 60-65%, normal RV.   On 2 L HFNC this morning.  Sitting up in chair watching movie.  Walked 185 feet yesterday.  CXR with stable bilateral infiltrates.   I/Os negative.   ECMO parameters: 3400 rpm Flow 4.55 L/min Pvenous -80 Delta P  22 Sweep 3.5 HFNC 2 L  ABG 7.36/63/187/100% LDH 334 PTT 59 Lactate 0.8 Hgb 8.7  Objective:   Weight Range: 78.1 kg Body mass index is 29.55 kg/m.   Vital Signs:   Temp:  [98 F (36.7 C)-98.4 F (36.9 C)] 98 F (36.7 C) (02/22 2000) Pulse Rate:  [92-116] 109 (02/23 0700) Resp:  [17-34] 32 (02/23 0700) BP: (100-139)/(57-92) 109/57 (02/23 0600) SpO2:  [96 %-100 %] 97 % (02/23 0700) Arterial Line BP: (99-193)/(56-96) 138/65 (02/23 0700) Weight:  [78.1 kg] 78.1 kg (02/23 0600) Last BM Date: 05/04/20  Weight change: Filed Weights   05/03/20 0600 05/04/20 0500 05/05/20 0600  Weight: 77.9 kg 78.8 kg 78.1 kg    Intake/Output:   Intake/Output Summary (Last 24 hours) at 05/05/2020 0735 Last data filed at 05/05/2020 0600 Gross per 24 hour  Intake 1451.87 ml  Output 2550 ml  Net -1098.13 ml      Physical Exam    General: NAD Neck: No JVD, no thyromegaly or thyroid nodule.  Lungs: Clear to auscultation bilaterally with normal respiratory effort. CV: Nondisplaced PMI.  Heart regular S1/S2, no S3/S4, no murmur.  Trace ankle edema. Abdomen: Soft, nontender, no hepatosplenomegaly, no distention.  Skin: Intact without lesions or rashes.  Neurologic: Alert and oriented x 3.  Psych: Normal affect. Extremities: Digital gangrene left hand.  HEENT: Normal.    Telemetry   NSR 100s. Personally reviewed   Labs    CBC Recent Labs    05/04/20 1643 05/04/20 1646 05/05/20 0407 05/05/20 0408  WBC 10.6*  --  9.4  --  HGB 9.2*   < > 8.7* 8.5*  HCT 28.9*   < > 27.5* 25.0*  MCV 93.5  --  94.5  --   PLT 252  --  230  --    < > = values in this interval not displayed.   Basic Metabolic Panel Recent Labs    96/29/5202/22/22 1643 05/04/20 1646 05/05/20 0407 05/05/20 0408  NA 134*   < > 137 136  K 3.9   < > 3.9 4.0  CL 96*  --  96*  --   CO2 29  --  32  --   GLUCOSE 151*  --  136*  --   BUN 31*  --  32*  --   CREATININE 0.67  --  0.70  --   CALCIUM 8.7*  --  8.8*  --     < > = values in this interval not displayed.   Liver Function Tests No results for input(s): AST, ALT, ALKPHOS, BILITOT, PROT, ALBUMIN in the last 72 hours. No results for input(s): LIPASE, AMYLASE in the last 72 hours. Cardiac Enzymes No results for input(s): CKTOTAL, CKMB, CKMBINDEX, TROPONINI in the last 72 hours.  BNP: BNP (last 3 results) No results for input(s): BNP in the last 8760 hours.  ProBNP (last 3 results) No results for input(s): PROBNP in the last 8760 hours.   D-Dimer No results for input(s): DDIMER in the last 72 hours. Hemoglobin A1C No results for input(s): HGBA1C in the last 72 hours. Fasting Lipid Panel No results for input(s): CHOL, HDL, LDLCALC, TRIG, CHOLHDL, LDLDIRECT in the last 72 hours. Thyroid Function Tests No results for input(s): TSH, T4TOTAL, T3FREE, THYROIDAB in the last 72 hours.  Invalid input(s): FREET3  Other results:   Imaging    DG CHEST PORT 1 VIEW  Result Date: 05/05/2020 CLINICAL DATA:  Shortness of breath, ECMO, recent COVID EXAM: PORTABLE CHEST 1 VIEW COMPARISON:  Radiograph 05/04/2020 FINDINGS: ECMO cannula terminates in the IVC. Transesophageal tube tip terminates below the margins of imaging. Telemetry leads overlie the chest. Left upper extremity PICC tip terminates at the superior cavoatrial junction. Dense heterogeneous bilateral airspace opacities are present. Suspect some layering pleural effusion as well. No pneumothorax. No acute osseous or soft tissue abnormality. IMPRESSION: 1. Support devices as above. 2. Grossly stable heterogeneous bilateral airspace opacities and probable layering effusions. 3. Left upper extremity PICC tip terminates at the superior cavoatrial junction. Electronically Signed   By: Kreg ShropshirePrice  DeHay M.D.   On: 05/05/2020 06:46   DG CHEST PORT 1 VIEW  Result Date: 05/04/2020 CLINICAL DATA:  Patient on ECHO, assess PICC repositioning EXAM: PORTABLE CHEST 1 VIEW COMPARISON:  Chest radiograph May 04, 2020 at 0548 hours FINDINGS: ECMO cannula tip terminates in the inferior vena cava. Repositioning of the left upper extremity PICC which now terminates in the superior vena cava with tip oriented caudally. A enteric tube coursing below the diaphragm with tip obscured by collimation. The heart size and mediastinal contours are largely obscured. Similar diffuse bilateral airspace opacities. The visualized skeletal structures are unchanged. IMPRESSION: 1. Repositioning of the left upper extremity PICC which now terminates in the superior vena cava with tip oriented caudally. 2. Unchanged positioning of the ECMO cannulae with similar diffuse bilateral airspace opacities. Electronically Signed   By: Maudry MayhewJeffrey  Waltz MD   On: 05/04/2020 12:08     Medications:     Scheduled Medications: . sodium chloride   Intravenous Once  . acetaZOLAMIDE  250 mg Per  Tube BID  . bethanechol  10 mg Per Tube TID  . carvedilol  25 mg Per Tube BID WC  . chlorhexidine  15 mL Mouth Rinse BID  . Chlorhexidine Gluconate Cloth  6 each Topical Daily  . chlorpheniramine-HYDROcodone  5 mL Per Tube Q12H  . clonazePAM  0.5 mg Oral Daily  . clonazePAM  1 mg Oral QHS  . dextromethorphan  30 mg Per Tube TID  . diltiazem  30 mg Oral Q12H  . docusate  100 mg Per Tube BID  . doxazosin  4 mg Per Tube Daily  . feeding supplement  237 mL Oral TID BM  . feeding supplement (PIVOT 1.5 CAL)  1,000 mL Per Tube Q24H  . fiber  1 packet Per Tube BID  . furosemide  40 mg Intravenous BID  . gabapentin  300 mg Oral BID  . insulin aspart  0-20 Units Subcutaneous Q4H  . insulin aspart  4 Units Subcutaneous 3 times per day  . insulin detemir  25 Units Subcutaneous Daily  . insulin detemir  40 Units Subcutaneous QHS  . ipratropium-albuterol  3 mL Nebulization BID  . liver oil-zinc oxide   Topical 5 X Daily  . mouth rinse  15 mL Mouth Rinse q12n4p  . melatonin  5 mg Oral QHS  . metoCLOPramide (REGLAN) injection  10 mg Intravenous Q8H  .  pantoprazole sodium  40 mg Per Tube QHS  . potassium chloride  40 mEq Per Tube Once  . QUEtiapine  50 mg Oral QHS  . rosuvastatin  40 mg Oral Daily  . sennosides  10 mL Per Tube QHS  . sertraline  25 mg Oral Daily  . sodium chloride flush  10-40 mL Intracatheter Q12H    Infusions: . sodium chloride    . sodium chloride Stopped (04/29/20 1611)  . albumin human Stopped (04/23/20 0041)  . bivalirudin (ANGIOMAX) infusion 0.5 mg/mL (Non-ACS indications) 0.088 mg/kg/hr (05/05/20 0600)    PRN Medications: Place/Maintain arterial line **AND** sodium chloride, acetaminophen (TYLENOL) oral liquid 160 mg/5 mL, albumin human, [DISCONTINUED] lidocaine **AND** albuterol, clonazePAM, Gerhardt's butt cream, guaiFENesin, hydrALAZINE, labetalol, lip balm, ondansetron (ZOFRAN) IV, oxyCODONE, phenol, polyethylene glycol, promethazine-codeine, simethicone, sodium chloride, sodium chloride flush   Assessment/Plan   1. Acute hypoxemic respiratory failure: Due to COVID-19 PNA with bilateral infiltrates.  Refractory hypoxemia, VV-ECMO cannulation on 02/18/2020 with improvement in oxygenation.  Developed left PTX post-subclavian CVL and had left chest tube, the left lung is re-expanded and CT out.  He was extubated 1/2 but reintubated 1/4 with agitation and suspected aspiration.  Tracheostomy 1/6.  CTA chest 1/22 with suspected chronic PEs and ARDS. ECMO cannula repositioned 1/5. ECMO circuit changed 1/26. Extubated to HFNC on 2/5. ECMO circuit changed and cannula repositioned 2/22.  LDH stable. Sweep at 3.5, have struggled with hypercarbia from significant dead space ventilation.  Now off abx.  I/Os generally even to negative with IV Lasix + acetazolamide. ABG ok today.  - Continue slow sweep wean, drop to 3. - Continue acetazolamide 250 mg bid and Lasix 40 mg IV bid today.   - Patient has had remdesivir, tocilizumab. - Completed steroid taper. - Continue bivalirudin, goal PTT 65-80.  PTT 59. Discussed dosing with  PharmD personally. - Continue to mobilize. - Coreg + low dose diltiazem continues at 25 mg bid to increase fractional flow via ECMO circuit.  - Due to lack of insurance, lung transplant currently is not an option.  2. 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  Clot noted on TEE in RV as well.  TTE 1/2 showed normal EF 60-65%, RV improved (mildly dilated/dysfunctional). TEE on 1/5 with moderate to severe RV dysfunction but patient was hypoxemic.  Had 1/2 dose TPA on 1/5. Echo 1/20 with mildly dilated/mildly dysfunctional RV. CTA chest 1/22 with chronic-appearing PEs in upper lobes. - PTT 59. Bivalirudin for goal PTT 65-80. Discussed dosing with PharmD personally. 3. Left PTX: Left chest tube, lung is re-expanded. Tube now out, stable CXR. 4. Shock: Suspect septic/distributive.  Now resolved, off NE.  5. Anemia: Hgb 8.7, transfuse < 7.5.  6. AKI: Resolved 7. Hyperglycemia: insulin.  8. HTN: Controlled.     - Continue Coreg 25 mg bid.   - Diltiazem 30 bid.  9. CHB: Episode of CHB when hypoxemic and with cough (suspect vagal).  NSR since then.   He has occasional short vagal episodes.  - Continue Coreg/diltiazem, watch rhythm.  10. Thrombocytopenia: Resolved  11. FEN: Continue tube feeds.  12. Ischemic digits: LUE.  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  Repeat study 1/17 showed no obstruction.  - Wound Care following. 13. ID: Group F Strep and Enterococcus faecalis in sputum. Initially completed abx for these bacteria. Now with recurrent sepsis, Enterococcus faecalis in blood now. Vancomycin restarted and continued to 2/21. TEE 2/7 with no vegetation. Currently no abx.  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   - resolved 15. Hypernatremia: Resolved. Continue free water. No change 16. Depression: Started  sertraline 25 mg daily.   CRITICAL CARE Performed by: Marca Ancona  Total critical care time: 35 minutes  Critical care time was exclusive of separately billable procedures and treating other patients.  Critical care was necessary to treat or prevent imminent or life-threatening deterioration.  Critical care was time spent personally by me on the following activities: development of treatment plan with patient and/or surrogate as well as nursing, discussions with consultants, evaluation of patient's response to treatment, examination of patient, obtaining history from patient or surrogate, ordering and performing treatments and interventions, ordering and review of laboratory studies, ordering and review of radiographic studies, pulse oximetry and re-evaluation of patient's condition.    Length of Stay: 59  Marca Ancona, MD  05/05/2020, 7:35 AM  Advanced Heart Failure Team Pager [phone] (M-F; 7a - 4p)  Please contact [facility] for night-coverage after hours (4p -7a ) and weekends on [email or web address]

## 2020-05-05 NOTE — Progress Notes (Signed)
ANTICOAGULATION CONSULT NOTE  Pharmacy Consult for bivalirudin Indication: ECMO and VTE  Labs: Recent Labs    05/03/20 0355 05/03/20 0646 05/04/20 0414 05/04/20 1643 05/04/20 1646 05/05/20 0407 05/05/20 0408 05/05/20 1343 05/05/20 1554 05/05/20 1617  HGB 8.7*   < > 9.0* 9.2*   < > 8.7*   < > 9.5* 9.2* 9.1*  HCT 26.7*   < > 26.4* 28.9*   < > 27.5*   < > 28.0* 27.0* 28.6*  PLT 232   < > 230 252  --  230  --   --   --  260  APTT 59*   < > 62* 57*  --  59*  --   --   --  61*  LABPROT 17.1*  --  17.2*  --   --  17.0*  --   --   --   --   INR 1.5*  --  1.5*  --   --  1.4*  --   --   --   --   CREATININE 0.81   < > 0.77 0.67  --  0.70  --   --   --  0.70   < > = values in this interval not displayed.    Assessment: 21 yoM admitted with COVID-19 PNA with worsening hypoxia, s/p cannulation for ECMO. Pt was started on IV heparin prior to cannulation due to acute DVTs and possible PE, transitioned to bivalirudin with ECMO. Now s/p tPA on 1/5 and tracheostomy on 1/6. ECMO circuit changed on 05/03/2020  aptt at goal 61  Goal of Therapy:  aPTT 60-80 seconds   Plan:  -continue bivalirudin 0.09 mg/kg/hr (using order-specific wt 72.1kg) -Monitor q12h aPTT/CBC, LDH, and for s/sx of bleeding  Elmer Sow, PharmD, BCPS, BCCCP Clinical Pharmacist [phone]  Please check AMION for all [facility] numbers  05/05/2020 8:03 PM

## 2020-05-06 ENCOUNTER — Inpatient Hospital Stay (HOSPITAL_COMMUNITY): Payer: Medicaid Other

## 2020-05-06 LAB — POCT I-STAT 7, (LYTES, BLD GAS, ICA,H+H)
Acid-Base Excess: 7 mmol/L — ABNORMAL HIGH (ref 0.0–2.0)
Acid-Base Excess: 8 mmol/L — ABNORMAL HIGH (ref 0.0–2.0)
Acid-Base Excess: 8 mmol/L — ABNORMAL HIGH (ref 0.0–2.0)
Acid-Base Excess: 8 mmol/L — ABNORMAL HIGH (ref 0.0–2.0)
Acid-Base Excess: 8 mmol/L — ABNORMAL HIGH (ref 0.0–2.0)
Bicarbonate: 33.4 mmol/L — ABNORMAL HIGH (ref 20.0–28.0)
Bicarbonate: 34 mmol/L — ABNORMAL HIGH (ref 20.0–28.0)
Bicarbonate: 34.5 mmol/L — ABNORMAL HIGH (ref 20.0–28.0)
Bicarbonate: 34.7 mmol/L — ABNORMAL HIGH (ref 20.0–28.0)
Bicarbonate: 35.1 mmol/L — ABNORMAL HIGH (ref 20.0–28.0)
Calcium, Ion: 1.21 mmol/L (ref 1.15–1.40)
Calcium, Ion: 1.24 mmol/L (ref 1.15–1.40)
Calcium, Ion: 1.25 mmol/L (ref 1.15–1.40)
Calcium, Ion: 1.26 mmol/L (ref 1.15–1.40)
Calcium, Ion: 1.26 mmol/L (ref 1.15–1.40)
HCT: 23 % — ABNORMAL LOW (ref 39.0–52.0)
HCT: 26 % — ABNORMAL LOW (ref 39.0–52.0)
HCT: 26 % — ABNORMAL LOW (ref 39.0–52.0)
HCT: 26 % — ABNORMAL LOW (ref 39.0–52.0)
HCT: 26 % — ABNORMAL LOW (ref 39.0–52.0)
Hemoglobin: 7.8 g/dL — ABNORMAL LOW (ref 13.0–17.0)
Hemoglobin: 8.8 g/dL — ABNORMAL LOW (ref 13.0–17.0)
Hemoglobin: 8.8 g/dL — ABNORMAL LOW (ref 13.0–17.0)
Hemoglobin: 8.8 g/dL — ABNORMAL LOW (ref 13.0–17.0)
Hemoglobin: 8.8 g/dL — ABNORMAL LOW (ref 13.0–17.0)
O2 Saturation: 98 %
O2 Saturation: 98 %
O2 Saturation: 99 %
O2 Saturation: 99 %
O2 Saturation: 99 %
Patient temperature: 36.8
Patient temperature: 36.8
Patient temperature: 36.8
Patient temperature: 36.9
Patient temperature: 36.9
Potassium: 3.8 mmol/L (ref 3.5–5.1)
Potassium: 3.9 mmol/L (ref 3.5–5.1)
Potassium: 4 mmol/L (ref 3.5–5.1)
Potassium: 4.1 mmol/L (ref 3.5–5.1)
Potassium: 4.2 mmol/L (ref 3.5–5.1)
Sodium: 132 mmol/L — ABNORMAL LOW (ref 135–145)
Sodium: 132 mmol/L — ABNORMAL LOW (ref 135–145)
Sodium: 134 mmol/L — ABNORMAL LOW (ref 135–145)
Sodium: 134 mmol/L — ABNORMAL LOW (ref 135–145)
Sodium: 135 mmol/L (ref 135–145)
TCO2: 35 mmol/L — ABNORMAL HIGH (ref 22–32)
TCO2: 36 mmol/L — ABNORMAL HIGH (ref 22–32)
TCO2: 36 mmol/L — ABNORMAL HIGH (ref 22–32)
TCO2: 36 mmol/L — ABNORMAL HIGH (ref 22–32)
TCO2: 37 mmol/L — ABNORMAL HIGH (ref 22–32)
pCO2 arterial: 55.6 mmHg — ABNORMAL HIGH (ref 32.0–48.0)
pCO2 arterial: 57.7 mmHg — ABNORMAL HIGH (ref 32.0–48.0)
pCO2 arterial: 58.4 mmHg — ABNORMAL HIGH (ref 32.0–48.0)
pCO2 arterial: 60.3 mmHg — ABNORMAL HIGH (ref 32.0–48.0)
pCO2 arterial: 64.8 mmHg — ABNORMAL HIGH (ref 32.0–48.0)
pH, Arterial: 7.342 — ABNORMAL LOW (ref 7.350–7.450)
pH, Arterial: 7.367 (ref 7.350–7.450)
pH, Arterial: 7.369 (ref 7.350–7.450)
pH, Arterial: 7.379 (ref 7.350–7.450)
pH, Arterial: 7.393 (ref 7.350–7.450)
pO2, Arterial: 101 mmHg (ref 83.0–108.0)
pO2, Arterial: 108 mmHg (ref 83.0–108.0)
pO2, Arterial: 130 mmHg — ABNORMAL HIGH (ref 83.0–108.0)
pO2, Arterial: 136 mmHg — ABNORMAL HIGH (ref 83.0–108.0)
pO2, Arterial: 155 mmHg — ABNORMAL HIGH (ref 83.0–108.0)

## 2020-05-06 LAB — CULTURE, BLOOD (ROUTINE X 2)
Culture: NO GROWTH
Culture: NO GROWTH
Special Requests: ADEQUATE
Special Requests: ADEQUATE

## 2020-05-06 LAB — BASIC METABOLIC PANEL
Anion gap: 9 (ref 5–15)
Anion gap: 9 (ref 5–15)
BUN: 33 mg/dL — ABNORMAL HIGH (ref 6–20)
BUN: 31 mg/dL — ABNORMAL HIGH (ref 6–20)
CO2: 31 mmol/L (ref 22–32)
CO2: 32 mmol/L (ref 22–32)
Calcium: 8.8 mg/dL — ABNORMAL LOW (ref 8.9–10.3)
Calcium: 8.7 mg/dL — ABNORMAL LOW (ref 8.9–10.3)
Chloride: 95 mmol/L — ABNORMAL LOW (ref 98–111)
Chloride: 93 mmol/L — ABNORMAL LOW (ref 98–111)
Creatinine, Ser: 0.79 mg/dL (ref 0.61–1.24)
Creatinine, Ser: 0.84 mg/dL (ref 0.61–1.24)
GFR, Estimated: >60 mL/min (ref >60)
GFR, Estimated: >60 mL/min (ref >60)
Glucose, Bld: 138 mg/dL — ABNORMAL HIGH (ref 70–99)
Glucose, Bld: 283 mg/dL — ABNORMAL HIGH (ref 70–99)
Potassium: 4.2 mmol/L (ref 3.5–5.1)
Potassium: 3.9 mmol/L (ref 3.5–5.1)
Sodium: 135 mmol/L (ref 135–145)
Sodium: 134 mmol/L — ABNORMAL LOW (ref 135–145)

## 2020-05-06 LAB — CBC
HCT: 28.3 % — ABNORMAL LOW (ref 39.0–52.0)
HCT: 26.7 % — ABNORMAL LOW (ref 39.0–52.0)
Hemoglobin: 9.3 g/dL — ABNORMAL LOW (ref 13.0–17.0)
Hemoglobin: 8.7 g/dL — ABNORMAL LOW (ref 13.0–17.0)
MCH: 30.8 pg (ref 26.0–34.0)
MCH: 30.5 pg (ref 26.0–34.0)
MCHC: 32.9 g/dL (ref 30.0–36.0)
MCHC: 32.6 g/dL (ref 30.0–36.0)
MCV: 93.7 fL (ref 80.0–100.0)
MCV: 93.7 fL (ref 80.0–100.0)
Platelets: 236 10*3/uL (ref 150–400)
Platelets: 251 10*3/uL (ref 150–400)
RBC: 3.02 MIL/uL — ABNORMAL LOW (ref 4.22–5.81)
RBC: 2.85 MIL/uL — ABNORMAL LOW (ref 4.22–5.81)
RDW: 15.9 % — ABNORMAL HIGH (ref 11.5–15.5)
RDW: 15.9 % — ABNORMAL HIGH (ref 11.5–15.5)
WBC: 8.9 10*3/uL (ref 4.0–10.5)
WBC: 8.7 10*3/uL (ref 4.0–10.5)
nRBC: 0 % (ref 0.0–0.2)
nRBC: 0 % (ref 0.0–0.2)

## 2020-05-06 LAB — PROTIME-INR
INR: 1.4 — ABNORMAL HIGH (ref 0.8–1.2)
Prothrombin Time: 16.7 seconds — ABNORMAL HIGH (ref 11.4–15.2)

## 2020-05-06 LAB — GLUCOSE, CAPILLARY
Glucose-Capillary: 132 mg/dL — ABNORMAL HIGH (ref 70–99)
Glucose-Capillary: 139 mg/dL — ABNORMAL HIGH (ref 70–99)
Glucose-Capillary: 147 mg/dL — ABNORMAL HIGH (ref 70–99)
Glucose-Capillary: 167 mg/dL — ABNORMAL HIGH (ref 70–99)
Glucose-Capillary: 169 mg/dL — ABNORMAL HIGH (ref 70–99)
Glucose-Capillary: 192 mg/dL — ABNORMAL HIGH (ref 70–99)
Glucose-Capillary: 212 mg/dL — ABNORMAL HIGH (ref 70–99)
Glucose-Capillary: 276 mg/dL — ABNORMAL HIGH (ref 70–99)

## 2020-05-06 LAB — HEPATIC FUNCTION PANEL
ALT: 51 U/L — ABNORMAL HIGH (ref 0–44)
AST: 25 U/L (ref 15–41)
Albumin: 2.9 g/dL — ABNORMAL LOW (ref 3.5–5.0)
Alkaline Phosphatase: 161 U/L — ABNORMAL HIGH (ref 38–126)
Bilirubin, Direct: 0.2 mg/dL (ref 0.0–0.2)
Indirect Bilirubin: 0.6 mg/dL (ref 0.3–0.9)
Total Bilirubin: 0.8 mg/dL (ref 0.3–1.2)
Total Protein: 5.6 g/dL — ABNORMAL LOW (ref 6.5–8.1)

## 2020-05-06 LAB — FIBRINOGEN: Fibrinogen: 385 mg/dL (ref 210–475)

## 2020-05-06 LAB — LACTIC ACID, PLASMA
Lactic Acid, Venous: 1.1 mmol/L (ref 0.5–1.9)
Lactic Acid, Venous: 1.3 mmol/L (ref 0.5–1.9)

## 2020-05-06 LAB — APTT
aPTT: 57 seconds — ABNORMAL HIGH (ref 24–36)
aPTT: 66 seconds — ABNORMAL HIGH (ref 24–36)

## 2020-05-06 LAB — LACTATE DEHYDROGENASE: LDH: 329 U/L — ABNORMAL HIGH (ref 98–192)

## 2020-05-06 MED ORDER — FUROSEMIDE 10 MG/ML IJ SOLN
40.0000 mg | Freq: Three times a day (TID) | INTRAMUSCULAR | Status: DC
  Administered 2020-05-06 – 2020-05-11 (×15): 40 mg via INTRAVENOUS
  Filled 2020-05-06 (×15): qty 4

## 2020-05-06 MED ORDER — PANTOPRAZOLE SODIUM 40 MG PO PACK
40.0000 mg | PACK | Freq: Every day | ORAL | Status: DC
  Administered 2020-05-06 – 2020-05-08 (×3): 40 mg via ORAL
  Filled 2020-05-06 (×3): qty 20

## 2020-05-06 MED ORDER — DEXTROMETHORPHAN POLISTIREX ER 30 MG/5ML PO SUER
30.0000 mg | Freq: Three times a day (TID) | ORAL | Status: DC
  Administered 2020-05-06 – 2020-05-08 (×9): 30 mg via ORAL
  Filled 2020-05-06 (×11): qty 5

## 2020-05-06 MED ORDER — SODIUM CHLORIDE 0.9 % IV SOLN
0.0800 mg/kg/h | INTRAVENOUS | Status: DC
  Administered 2020-05-07 – 2020-05-09 (×2): 0.095 mg/kg/h via INTRAVENOUS
  Filled 2020-05-06 (×3): qty 250

## 2020-05-06 MED ORDER — ACETAZOLAMIDE 250 MG PO TABS
250.0000 mg | ORAL_TABLET | Freq: Two times a day (BID) | ORAL | Status: DC
  Administered 2020-05-06 – 2020-05-08 (×5): 250 mg via ORAL
  Filled 2020-05-06 (×6): qty 1

## 2020-05-06 MED ORDER — DOXAZOSIN MESYLATE 4 MG PO TABS
4.0000 mg | ORAL_TABLET | Freq: Every day | ORAL | Status: DC
  Administered 2020-05-06 – 2020-05-08 (×3): 4 mg via ORAL
  Filled 2020-05-06 (×4): qty 1

## 2020-05-06 MED ORDER — DILTIAZEM HCL 60 MG PO TABS
30.0000 mg | ORAL_TABLET | Freq: Every day | ORAL | Status: DC
  Administered 2020-05-06 – 2020-05-08 (×3): 30 mg via ORAL
  Filled 2020-05-06 (×3): qty 1

## 2020-05-06 MED ORDER — BETHANECHOL CHLORIDE 10 MG PO TABS
10.0000 mg | ORAL_TABLET | Freq: Three times a day (TID) | ORAL | Status: DC
  Administered 2020-05-06 – 2020-05-08 (×9): 10 mg via ORAL
  Filled 2020-05-06 (×8): qty 1

## 2020-05-06 MED ORDER — ACETAMINOPHEN 160 MG/5ML PO SOLN
650.0000 mg | Freq: Four times a day (QID) | ORAL | Status: DC | PRN
  Filled 2020-05-06 (×2): qty 20.3

## 2020-05-06 MED ORDER — HYDROCOD POLST-CPM POLST ER 10-8 MG/5ML PO SUER
5.0000 mL | Freq: Two times a day (BID) | ORAL | Status: DC
  Administered 2020-05-06 – 2020-05-08 (×6): 5 mL via ORAL
  Filled 2020-05-06 (×5): qty 5

## 2020-05-06 MED ORDER — NUTRISOURCE FIBER PO PACK
1.0000 | PACK | Freq: Two times a day (BID) | ORAL | Status: DC
  Administered 2020-05-06 – 2020-05-08 (×6): 1 via ORAL
  Filled 2020-05-06 (×7): qty 1

## 2020-05-06 MED ORDER — CARVEDILOL 25 MG PO TABS
25.0000 mg | ORAL_TABLET | Freq: Two times a day (BID) | ORAL | Status: DC
  Administered 2020-05-06 – 2020-05-08 (×5): 25 mg via ORAL
  Filled 2020-05-06 (×5): qty 1

## 2020-05-06 MED ORDER — GUAIFENESIN 100 MG/5ML PO SOLN: 5.0000 mL | ORAL | Status: DC | PRN

## 2020-05-06 MED ORDER — ENSURE ENLIVE PO LIQD
237.0000 mL | Freq: Three times a day (TID) | ORAL | Status: DC
  Administered 2020-05-06 – 2020-05-08 (×10): 237 mL via ORAL

## 2020-05-06 MED ORDER — SIMETHICONE 40 MG/0.6ML PO SUSP
40.0000 mg | Freq: Four times a day (QID) | ORAL | Status: DC | PRN
  Filled 2020-05-06: qty 0.6

## 2020-05-06 MED ORDER — PROSOURCE PLUS PO LIQD
30.0000 mL | Freq: Three times a day (TID) | ORAL | Status: DC
  Administered 2020-05-06 – 2020-05-08 (×8): 30 mL via ORAL
  Filled 2020-05-06 (×7): qty 30

## 2020-05-06 MED ORDER — ADULT MULTIVITAMIN W/MINERALS CH
1.0000 | ORAL_TABLET | Freq: Every day | ORAL | Status: DC
  Administered 2020-05-06 – 2020-05-08 (×3): 1 via ORAL
  Filled 2020-05-06 (×3): qty 1

## 2020-05-06 MED ORDER — ALBUMIN HUMAN 25 % IV SOLN
12.5000 g | Freq: Once | INTRAVENOUS | Status: AC
  Administered 2020-05-06: 12.5 g via INTRAVENOUS
  Filled 2020-05-06: qty 50

## 2020-05-06 MED ORDER — SENNOSIDES 8.8 MG/5ML PO SYRP
10.0000 mL | ORAL_SOLUTION | Freq: Every day | ORAL | Status: DC
  Filled 2020-05-06: qty 10

## 2020-05-06 NOTE — Progress Notes (Signed)
ANTICOAGULATION CONSULT NOTE  Pharmacy Consult for bivalirudin Indication: ECMO and VTE  Labs: Recent Labs    05/04/20 0414 05/04/20 1643 05/05/20 0407 05/05/20 0408 05/05/20 1617 05/05/20 2001 05/06/20 0353 05/06/20 0357 05/06/20 0652 05/06/20 1613  HGB 9.0*   < > 8.7*   < > 9.1*   < > 9.3* 8.8* 8.8* 8.7*  HCT 26.4*   < > 27.5*   < > 28.6*   < > 28.3* 26.0* 26.0* 26.7*  PLT 230   < > 230  --  260  --  236  --   --  251  APTT 62*   < > 59*  --  61*  --  57*  --   --  66*  LABPROT 17.2*  --  17.0*  --   --   --  16.7*  --   --   --   INR 1.5*  --  1.4*  --   --   --  1.4*  --   --   --   CREATININE 0.77   < > 0.70  --  0.70  --  0.79  --   --   --    < > = values in this interval not displayed.    Assessment: 48 yoM admitted with COVID-19 PNA with worsening hypoxia, s/p cannulation for ECMO. Pt was started on IV heparin prior to cannulation due to acute DVTs and possible PE, transitioned to bivalirudin with ECMO. Now s/p tPA on 1/5 and tracheostomy on 1/6. ECMO circuit changed on 05/03/2020  aptt within goal  Goal of Therapy:  aPTT 60-80 seconds   Plan:  -continue bivalirudin to 0.095 mg/kg/hr (using order-specific wt 72.1kg) -Monitor q12h aPTT/CBC, LDH, and for s/sx of bleeding  Elmer Sow, PharmD, BCCCP Clinical Pharmacist [phone]  Please check AMION for all [facility] numbers  05/06/2020 5:17 PM

## 2020-05-06 NOTE — Progress Notes (Signed)
Physical Therapy Treatment Patient Details Name: Alex [name] MRN: [id number] DOB: 1970-09-07 Today's Date: 05/06/2020    History of Present Illness Pt is 50 y.o. male  with no significant past medical history admitted on 03-23-2020 with dyspnea, cough, nausea/vomiting ~ 1 week ago with worsening symptoms of body aches and fatigue and +COVID 02/07/20 and admitted with shortness of breath. Required intubation 03/10/20. Cannulated for VV ECMO 03/03/2020. Oxygenating better with ECMO. Also evidence of RLE DVT, RV thrombus, and high suspicion of PE. Has required chest tube to L lung for collapse which needs further reposition with recurrent collapse. Extubated 03-15-19.  Reintubated 1/5 and trach placed 1/6. Decannulated 04/16/20.    PT Comments    Pt continues to make marked progress towards his goals today, increasing total ambulation distance to 317 feet. Pt had Cortrak removed and was in bed until therapy session which may have contributed to increased activity tolerance today. Pt cognition also clearer, able to state goals of session and provide more feedback to therapist during ambulation. Will look at adjusting time of therapy session to earlier in morning prior to sitting up in chair for long duration. Pt supplemental O2 at 8L O2 via HFNC and ECMO sweep at 8. PT will follow back tomorrow for continued progression of therapy.     Follow Up Recommendations  CIR;Supervision/Assistance - 24 hour     Equipment Recommendations  Other (comment) (TBD (pt still on ECMO))    Recommendations for Other Services       Precautions / Restrictions Precautions Precautions: Fall Precaution Comments: ECMO, NGtube, Foley, Pt decannulated himself 2/4 Restrictions Other Position/Activity Restrictions: L platform walker used for mobility due to discomfort with grip to L hand    Mobility  Bed Mobility               General bed mobility comments: OOB in recliner    Transfers Overall transfer level:  Needs assistance Equipment used: Left platform walker Transfers: Sit to/from Stand;Stand Pivot Transfers Sit to Stand: +2 physical assistance;+2 safety/equipment;Min assist;Mod assist         General transfer comment: min-modAx2 for 4x power up from recliner, pt with much more energy reserve today and able to stand from recliner with minimal cuing for hand and foot placement for improved power up, and for glute activation to come to fully upright  Ambulation/Gait Ambulation/Gait assistance: Min assist;Mod assist;+2 physical assistance Gait Distance (Feet): 317 Feet (4 bouts of walking = 317 feet) Assistive device: Left platform walker Gait Pattern/deviations: Step-through pattern;Decreased step length - right;Decreased step length - left;Narrow base of support;Trunk flexed;Shuffle Gait velocity: slowed Gait velocity interpretation: <1.31 ft/sec, indicative of household ambulator General Gait Details: pt with much less fatigue today as he was in bed until just before therapy session, much better command follow and self correction. Pt workining on wider BoS, improved proximity to RW and upright posture, pt less fatigued with walking bouts and able to recover more quickly than yesterday       Balance Overall balance assessment: Needs assistance Sitting-balance support: No upper extremity supported;Feet unsupported Sitting balance-Leahy Scale: Fair     Standing balance support: Bilateral upper extremity supported;During functional activity Standing balance-Leahy Scale: Poor Standing balance comment: requires outside support to maintain balance in standing                            Cognition Arousal/Alertness: Awake/alert Behavior During Therapy: WFL for tasks assessed/performed Overall Cognitive Status: Within  Functional Limits for tasks assessed Area of Impairment: Attention                   Current Attention Level: Sustained           General Comments:  pt with decreased lethargy and increased cognitive acuity      Exercises      General Comments General comments (skin integrity, edema, etc.): max HR 120s, 6L supplemental O2 via HFNC, Sweep 6. Pt is most grateful for removal of Cortrak and reports decreased WoB as a result      Pertinent Vitals/Pain Pain Assessment: Faces Faces Pain Scale: Hurts a little bit Pain Location: L fingers with pressure Pain Descriptors / Indicators: Discomfort;Grimacing;Guarding Pain Intervention(s): Monitored during session;Repositioned           PT Goals (current goals can now be found in the care plan section) Acute Rehab PT Goals Patient Stated Goal: continue to walk PT Goal Formulation: With patient Time For Goal Achievement: 05/10/20 Potential to Achieve Goals: Fair Progress towards PT goals: Progressing toward goals    Frequency    Min 5X/week      PT Plan Current plan remains appropriate    Co-evaluation PT/OT/SLP Co-Evaluation/Treatment: Yes Reason for Co-Treatment: Complexity of the patient's impairments (multi-system involvement) PT goals addressed during session: Mobility/safety with mobility OT goals addressed during session: ADL's and self-care      AM-PAC PT "6 Clicks" Mobility   Outcome Measure  Help needed turning from your back to your side while in a flat bed without using bedrails?: A Lot Help needed moving from lying on your back to sitting on the side of a flat bed without using bedrails?: A Lot Help needed moving to and from a bed to a chair (including a wheelchair)?: A Lot Help needed standing up from a chair using your arms (e.g., wheelchair or bedside chair)?: A Lot Help needed to walk in hospital room?: A Lot Help needed climbing 3-5 steps with a railing? : Total 6 Click Score: 11    End of Session Equipment Utilized During Treatment: Gait belt Activity Tolerance: Patient tolerated treatment well Patient left: in chair;with call bell/phone within  reach;with nursing/sitter in room Nurse Communication: Mobility status;Other (comment) (scheduled approx 11:30 for daily therapy) PT Visit Diagnosis: Muscle weakness (generalized) (M62.81);Difficulty in walking, not elsewhere classified (R26.2);Other abnormalities of gait and mobility (R26.89);Unsteadiness on feet (R26.81)     Time: 0626-9485 PT Time Calculation (min) (ACUTE ONLY): 67 min  Charges:  $Gait Training: 53-67 mins                     Nigel Wessman B. Beverely Risen PT, DPT Acute Rehabilitation Services Pager [phone] Office [phone]    Elon Alas Fleet 05/06/2020, 2:02 PM

## 2020-05-06 NOTE — Procedures (Signed)
Extracorporeal support note  ECLS cannulation date: 02/27/2020 Last circuit change: 05/03/20  Indication: Acute hypoxic respiratory failure due to ARDS from COVID-19 pneumonia with RV dysfunction.   Configuration: Venovenous  Drainage cannula: 32 French crescent cannula via right IJ Return cannula: Same  Pump speed: 3450 RPM Pump flow: 4.83 L/min Pump used: Cardio help  Oxygenator: Cardio help O2 blender: 100% Sweep gas: 7 L (!!)  Circuit check: no clots Anticoagulant: Bivalirudin Anticoagulation targets: PTT 60-80  Changes in support:  - Increase diuresis - Stop nocturnal feeds - Work on sweep wean as we get off volume  Anticipated goals/duration of support: Bridge to recovery   Multidisciplinary ECMO rounds completed.  Lorin Glass, MD 05/06/20 9:04 AM [facility]

## 2020-05-06 NOTE — Progress Notes (Signed)
Critical Care Progress Note  NAME:  Alex [name], MRN:  [id number], DOB:  [birth year], LOS: 58 ADMISSION DATE:  03/05/2020, CONSULTATION DATE: 04/08/2020 REFERRING MD: Wynona Neat -LBPCCM, CHIEF COMPLAINT: Respiratory failure requiring ECMO  HPI/course in hospital  50 year old man admitted to hospital 12/28 with 1 week history of dyspnea cough nausea and vomiting.  Initially admitted to [facility] long hospital and placed on high flow nasal cannula but rapidly failed and required intubation 12/29.  Persistent hypoxic respiratory failure with PF ratio 55 in spite of 18 of PEEP FiO2 0.1 despite paralytics.  Did not improve with prone ventilation  Cannulated for VV ECMO 12/30 via right IJ crescent cannula.  ECMO circuit was changed on 04/07/2020 Iatrogenic pneumothorax from left subclavian triple-lumen placement  12/28 admitted covid, ARDS 12/30 ECMO cannulation, iatrogenic pneumothrorax, DVT, Bivalrudin started           Left subclavian hematoma, chest tube, ceftriaxone/ azithromycin started 12/31 1Unit PRBC 1/1 Palliative consult, noted HTN, fentanyl only 1/2 Extubation I/O positive, left lung re-expanded, EF 60-65%, Lasix 20mg  BID 1/3 BiPAP in place, HTN to 190s, 200s, labetolol for BP, I/O even, cefepime and vancomycin started 1/4 agitation off BiPAP, possible aspiration 1/5 agitation overnight, reintubated      ECHO with RV dilation, ECMO cannula repositioned, LUE DVT (+), 1/2 dose tPA given,      I/O up, on lasix, Epi, NE, vasopressin started, HR/ rhythm issues noted 1/6 Tracheostomy, hypoglycemic when off tube feeds       Cortrack tune issues 1/7 on lasix gtt 4mg /hr with diuresis, agitation improved 1/8 starting steroid taper 1/9 severe agitation, heavy sedation required, lasix gtt 6mg /hr, I/Os (-)       precedex and fentanyl 1/10 Vanc stopped 1/11 sweep to 2, lasix gtt at 4mg /hr, weight up, metoprolol added for HTN 1/12 fevers to 99 noted, lasix gtt and metazolone 1/13 lasix gtt  dced 1/14 intermittent HTN, possibly flash pulmonary edema tied to sedation        Ischemic left hand changes, 1 Unit PRBCs 1/15 1/16 sweep from 4 to 2.5, chest tube removed 1/17 sweep at 6, trial of nebulized morphine for cough, LUE dopplers show no obstruction 1/18 two events of 2nd degree type II HB noted with coughing         sweep at 4, IV lasix 40, acetazolamide, distal XLT trach placement         agitation and BP spikes 1/19 agitation, air hunger, seroquel, klonopin, oxy, valproate, ketamine trial 1/20 sweep at 3.5, diuresis results in chugging, albumin given         1 Unit PRBCs, lidocaine nebulizers for cough, ancef started 1/21 sweep to 8, anxiety, I/Os (+) with lasix and acetazolamide 1/22 sweep at 5, chest CT bilateral upper lobe PEs 1/23 sweep at 5, delirium, I/Os even with lasix and acetazolamide         continued coughing, 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 1/24 sweep at 7.5, 1 Unit PRBCs, repeat bronchoscopy showed resolution of 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 1/25 sweep at 7.5, on propofol, lasix gtt at 94ml/hr, I/Os (+)         nebulized morphine and lidocaine for cough 1/26 sweep at 8, propofol/ precedex, cough present when weaned, lasix gtt at 70ml/hr        ECMO circuit changes, levophed started, 1 Unit PRBCs 1/27 sweep at 4, off sedation, delirium, I/Os even        lasix gtt restated, acetazolamide overnight 1/28 sweep at 7.5,  HTN labile 1/29 sweep of 6, patient reports a tickle in throat, cetacaine 1/30 sweep down to 6, cough improved with cetacaine and gabapentin 1/31 sweep at 5, agitation, off acetazolamide, lasix, weight up 2/1 cough, of pulmicort, cetacaine for cough, continued gabapentin, dexmethorphan       CT demonstrated trach placement against back of trachea 2/2 Trach collar trial somewhat effective in managing cough       low dose diltiazem for HR,  2/3 sweep at 4,trach cannula replacement (longer, more flexible bivona); brochoscopy  shows irritated mucosa       IV lasix and acetazolamide 2/4 sweep at 5, I/Os (+), BUN elevated, lasix gtt with diamox,       trach decannulation, increased gabapentin for cough 2/5 start HCAP coverage for rising WBC/temps, check Pct/cultures, CXR worse with lack of PEEP, diuresed well but started getting very hypotensive, sepsis vs. Too dry 04/18/20 blood cx grew E faecalis 2/7 PICC change, TEE 2/7>> sweep weans 2/22 Cannula adjustment, circuit change  Past Medical History  none  Interim history/subjective:  Weight up a good bit. Getting more puffy. Slept well.  Objective   Blood pressure 131/77, pulse (!) 108, temperature 98.4 F (36.9 C), temperature source Core, resp. rate (!) 28, height 5\' 4"  (1.626 m), weight 79.4 kg, SpO2 96 %.        Intake/Output Summary (Last 24 hours) at 05/06/2020 0834 Last data filed at 05/06/2020 0800 Gross per 24 hour  Intake 1214.49 ml  Output 2750 ml  Net -1535.51 ml   Filed Weights   05/04/20 0500 05/05/20 0600 05/06/20 0600  Weight: 78.8 kg 78.1 kg 79.4 kg    Examination: Constitutional: no acute distress sitting up in chair  Eyes: EOMI, pupils equal, facial plethora stable Ears, nose, mouth, and throat: MMM, prior trach site CDI Cardiovascular: RRR, ext warm Respiratory: rhonci, worse on left, shallow inspiratory efforts Gastrointestinal: soft, protuberant, +BS Skin: No rashes, normal turgor, stable ischemic changes of L hand Neurologic: moves all 4 ext to command Psychiatric: RASS 0 Ext: 2+ pitting edema of arms and hands  Labs reviewed, look good/stable Stable ARDS on CXR  Assessment & Plan:  Acute hypoxemic/hypercapnic respiratory failure due to severe ARDS from COVID-19 pneumonia Probable acute PE, and RV dysfunction s/p TPA Refractory coughing- improved after trach removal but still an issue intermittently Strep group F/ enterococcal pneumonia- s/p 10 days vanc 1/29; recurrent with bacteremia on 2/5 now s/p vanc x 14 days R  effusion- lymphocyte predominant transudate drained 2/16 - Continue VV ECMO, sweep weans titrated to WOB/ABG - Cough regimen as ordered - Continue supplemental oxygen via nasal cannula - IS, flutter, OOB mobility to prevent muscle atrophy - Keep even with diuresis - Today: increase diuresis, stop nocturnal night feeds (patient eating adequately, volumes are causing extreme weight gain), continue mobility efforts  HTN.  Trials of reducing HR/BP have resulted in reduced pCO2 (likely related to fractional flow through ECMO circuit) -Continue coreg, taper clonidine (to end 2/20), continue dilt; goal to keep lower MAPs and HR to increase fractional circuit flow, no changes here  Hyponatremia- improving with diureiss  Acute delirium, with agitation- improved. -Pain control with PRN oxycodone -Gabapentin and klonipin decreased 2/243 -Seroquel qHS at current dose working well  Sepsis secondary to E faecalis bacteremia- secondary to PNA, has grown enterococcus in sputum prior, previous vanc course completed 1/29. TEE on 2/7 without vegetations. PICC changed on 2/7.  14 day vanc therapy from PICC change completed 2/21.  Gastroparesis with  some element ileus: improved but occasional vomiting spells with coughing attacks.  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  - wound care following, appreciate dressing recommendations - keep A-line out of left arm   R arm swelling- duplex neg, NTD here, going to space out cuff measurements since we have arterial line  Hyperglycemia due to diabetes type II -Basal bolus insulin adjusted, goal CBG 100-180, more or less at goal with current regimen  Acute urinary retention- recurrent issue -increased cardura 2/22 -check intermittent bladder scans PRN -does better standing with this, hopefully does not need  another foley  Deconditioning -con't working with PT, OT, SLP, appreciate help  Destination of therapy- does not have social support for transplant.  Need to discuss this further if we continue to plateau, work toward euvolemia first then re-address.  Daily Goals Checklist  Pain/Anxiety/Delirium protocol (if indicated): see above VAP protocol (if indicated):  n/a DVT prophylaxis: bivalirudin GI prophylaxis: pantoprazole Glucose control: basal bolus insulin Mobility/therapy needs: Mobilization as tolerated Code Status: Full code Disposition: ICU  Patient critically ill due to ARDS Interventions to address this today ECMO titration Risk of deterioration without these interventions is high  I personally spent 32 minutes providing critical care not including any separately billable procedures  3/22 MD [facility] Prefer epic messenger for cross cover needs If after hours, please call E-link

## 2020-05-06 NOTE — Progress Notes (Signed)
ANTICOAGULATION CONSULT NOTE  Pharmacy Consult for bivalirudin Indication: ECMO and VTE  Labs: Recent Labs    05/04/20 0414 05/04/20 1643 05/05/20 0407 05/05/20 0408 05/05/20 1617 05/05/20 2001 05/06/20 0353 05/06/20 0357 05/06/20 0652  HGB 9.0*   < > 8.7*   < > 9.1*   < > 9.3* 8.8* 8.8*  HCT 26.4*   < > 27.5*   < > 28.6*   < > 28.3* 26.0* 26.0*  PLT 230   < > 230  --  260  --  236  --   --   APTT 62*   < > 59*  --  61*  --  57*  --   --   LABPROT 17.2*  --  17.0*  --   --   --  16.7*  --   --   INR 1.5*  --  1.4*  --   --   --  1.4*  --   --   CREATININE 0.77   < > 0.70  --  0.70  --  0.79  --   --    < > = values in this interval not displayed.    Assessment: 72 yoM admitted with COVID-19 PNA with worsening hypoxia, s/p cannulation for ECMO. Pt was started on IV heparin prior to cannulation due to acute DVTs and possible PE, transitioned to bivalirudin with ECMO. Now s/p tPA on 1/5 and tracheostomy on 1/6. ECMO circuit changed on 05/03/2020  Aptt this AM slightly below goal at 57 despite increasing rate of bivalirudin over the past few days.  No known issues with IV infusion.  No overt bleeding or complications noted.  LDH and fibrinogen both stable in 300s.  Goal of Therapy:  aPTT 60-80 seconds   Plan:  -Increase bivalirudin to 0.095 mg/kg/hr (using order-specific wt 72.1kg) -Monitor q12h aPTT/CBC, LDH, and for s/sx of bleeding  Jenetta Downer, [facility] Clinical Pharmacist  05/06/2020 7:19 AM   [facility] pharmacy phone numbers are listed on [email or web address]

## 2020-05-06 NOTE — Progress Notes (Signed)
Occupational Therapy Treatment Patient Details Name: Alex [name] MRN: [id number] DOB: 01-04-1971 Today's Date: 05/06/2020    History of present illness Pt is 50 y.o. male  with no significant past medical history admitted on 02/29/2020 with dyspnea, cough, nausea/vomiting ~ 1 week ago with worsening symptoms of body aches and fatigue and +COVID 02/07/20 and admitted with shortness of breath. Required intubation 03/10/20. Cannulated for VV ECMO 03/03/2020. Oxygenating better with ECMO. Also evidence of RLE DVT, RV thrombus, and high suspicion of PE. Has required chest tube to L lung for collapse which needs further reposition with recurrent collapse. Extubated 03-15-19.  Reintubated 1/5 and trach placed 1/6. Decannulated 04/16/20.   OT comments  Patient with continued progress toward increasing activity tolerance, stand tolerance, stand balance, and mobility which in turn is improving his ability to participate in functional tasks.  Patient is able to sit in a recliner with built up handles and feed himself with minimal setup.  He is also beginning the process of using a BSC for toileting.  He is very motivated and singularly focused on increasing how far he can walk in the unit.  Hopefully, this will begin to increase his participation and independence with ADL tasks.  Acute OT will continue to see him in the acute setting, hopefully getting him to CIR.     ECMO parameters: 3450 rpm Flow 4.8 L/min - bumped to 6 L for mobility.   Pvenous -80 Delta P 22 Sweep 7 HFNC 2 L  Follow Up Recommendations  CIR;Supervision/Assistance - 24 hour    Equipment Recommendations       Recommendations for Other Services      Precautions / Restrictions Precautions Precaution Comments: ECMO Restrictions Other Position/Activity Restrictions: L platform walker used for mobility due to discomfort with grip to L hand       Mobility Bed Mobility                 Patient Response:  Cooperative  Transfers   Equipment used: Left platform walker Transfers: Sit to/from Stand Sit to Stand: +2 safety/equipment;Min assist              Balance Overall balance assessment: Needs assistance Sitting-balance support: Feet supported Sitting balance-Leahy Scale: Fair     Standing balance support: Bilateral upper extremity supported;During functional activity Standing balance-Leahy Scale: Poor Standing balance comment: requires outside support to maintain balance in standing                              Vision Baseline Vision/History: No visual deficits     Perception     Praxis      Cognition Arousal/Alertness: Awake/alert   Overall Cognitive Status: Within Functional Limits for tasks assessed                                                            Pertinent Vitals/ Pain       Pain Assessment: No/denies pain  Frequency  Min 2X/week        Progress Toward Goals  OT Goals(current goals can now be found in the care plan section)  Progress towards OT goals: Progressing toward goals  Acute Rehab OT Goals Patient Stated Goal: continue to walk OT Goal Formulation: With patient Time For Goal Achievement: 05/19/2020 Potential to Achieve Goals: Good ADL Goals Pt Will Perform Grooming: (P) with min assist;standing  Plan Discharge plan remains appropriate;Frequency remains appropriate    Co-evaluation    PT/OT/SLP Co-Evaluation/Treatment: Yes Reason for Co-Treatment: Complexity of the patient's impairments (multi-system involvement);For patient/therapist safety;To address functional/ADL transfers   OT goals addressed during session: ADL's and self-care;Proper use of Adaptive equipment and DME      AM-PAC OT "6 Clicks" Daily Activity     Outcome Measure   Help from another person eating meals?: A Little Help from another  person taking care of personal grooming?: A Little Help from another person toileting, which includes using toliet, bedpan, or urinal?: A Lot Help from another person bathing (including washing, rinsing, drying)?: A Lot Help from another person to put on and taking off regular upper body clothing?: A Lot Help from another person to put on and taking off regular lower body clothing?: A Lot 6 Click Score: 14    End of Session Equipment Utilized During Treatment: Oxygen;Gait belt;Rolling walker  OT Visit Diagnosis: Muscle weakness (generalized) (M62.81);Other symptoms and signs involving cognitive function;Other abnormalities of gait and mobility (R26.89)   Activity Tolerance Patient tolerated treatment well   Patient Left     Nurse Communication          Time: 3009-2330 OT Time Calculation (min): 61 min  Charges: OT General Charges $OT Visit: 1 Visit OT Treatments $Therapeutic Activity: 23-37 mins  05/06/2020  Rich, OTR/L  Acute Rehabilitation Services  Office:  [phone]    Suzanna Obey 05/06/2020, 1:24 PM

## 2020-05-06 NOTE — Consult Note (Signed)
WOC Nurse Consult Note: Patient receiving care in [facility] 2H24. 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 Change daily. Also, the wrapping of each finger on the left hand with xeroform was making the skin too moist.  I have discontinued that order as it is no longer appropriate.  Helmut Muster, RN, MSN, CWOCN, CNS-BC, pager [phone]

## 2020-05-06 NOTE — Progress Notes (Signed)
Nutrition Follow-up  DOCUMENTATION CODES:   Not applicable  INTERVENTION:   Increase Ensure Enlive po to QID, each supplement provides 350 kcal and 20 grams of protein  Add 30 ml ProSource Plus TID, each supplement provides 100 kcals and 15 grams protein.   Continue Regular diet with no restrictions  Add MVI with Minerals   NUTRITION DIAGNOSIS:   Increased nutrient needs related to acute illness,catabolic illness (COVID-19 infection) as evidenced by estimated needs.  Being addressed via supplemental TF, supplements  GOAL:   Patient will meet greater than or equal to 90% of their needs  Progressing  MONITOR:   Vent status,TF tolerance,Labs,Weight trends  REASON FOR ASSESSMENT:   LOS Enteral/tube feeding initiation and management  ASSESSMENT:   50 y.o. male with no significant medical history. He presented to the ED with dyspnea, cough, and N/V. He reported that his symptoms began with cough 1 week ago. He tried OTC meds but nothing helped. Symptoms worsened to include body aches, fatigue, and N/V 3 days later. He went to his PCP 12/26 and got tested for COVID; he was positive.   12/26 COVID+  12/28 Admitted to [facility] 12/29 Intubated 12/30 Transferred to [facility], VV ECMO cannulation, L PTX with Chest tube insertion 12/31 Cortrak placed, Post-pyloric  1/02 Extubated to HFNC/BiPap as needed, ECHO with EF 60-65% 1/06 TEE for ECMO cannula position, Re-Intubated, Rhinorockets placed for epistaxis, Cortrak malpositioned-repositioned and now gastric per xray 1/07 Trach placed 1/14 Cortrak advanced to post pyloric position 1/16 L. Chest tube removed 1/22 CT chest: bilateral UL PE 1/23 Bronch showed semi occlusive ?mass/polyp in trachea 1/24 Bronch showed resolution of 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 1/26 ECMO Circuit exchanged 2/03 Trach exchange 2/05 Extubated to HFNC 2/07 TEE with no vegetation, EF 55-60% 2/14 Cortrak replaced, post-pyloric 2/15 Changed to nocturnal TF  2/16 Thoracentesis  with 500 mL  2/21 Cannula adjustment, circuit change 2/24 Cortrak removed per MD order, nocturnal TF discontinued  Pt remains on VV ECMO. Not transplant candidate Continues to work with therapies  Cortrak removed and nocturnal TF discontinued at request of MD this AM. Noted concern that nocturnal TF discontinued due to fluid status; of note, nocturnal TF as ordered only providing around 500 mL free water per day  PO intake has remained improved since the weekend. Recorded po intake 75-100% of meals. Pt continues to Ensure supplements as well. Hopeful that current po intake will remain consistent as pt with elevated nutritional needs given ECMO, wound healing, acute illness. Continue to encourage intake of meals but also oral nutrition supplements  WOC RN evaluated stage 3 PI on right buttocks  Weight up, current wt 79 kg; moderate edema in all extremities. Unsure of dry weight  Labs: reviewed Meds: colace, nutrisource fiber, lasix, ss novolog, levemir, novolog with meals, reglan   Diet Order:   Diet Order            Diet regular Room service appropriate? Yes; Fluid consistency: Thin  Diet effective now                 EDUCATION NEEDS:   Not appropriate for education at this time  Skin:  Skin Assessment: Skin Integrity Issues: Skin Integrity Issues:: Stage III DTI: buttocks, forhead Stage II: anus, coccyx Stage III: buttocks Other: ischemic changes to LUE, large bulla on L hand that ruptured  Last BM:  2/22  Height:   Ht Readings from Last 1 Encounters:  03-27-20 5\' 4"  (1.626 m)    Weight:  Wt Readings from Last 1 Encounters:  05/06/20 79.4 kg    Ideal Body Weight:  59.1 kg  BMI:  Body mass index is 30.05 kg/m.  Estimated Nutritional Needs:   Kcal:  2300-2500 kcals  Protein:  130-150 g  Fluid:  >/= 2 L    Romelle Starcher MS, RDN, LDN, CNSC Registered Dietitian III Clinical Nutrition RD Pager and On-Call Pager Number Located in [place]

## 2020-05-06 NOTE — Progress Notes (Signed)
Patient ID: Alex [name], male   DOB: [birth year], 50 y.o.   MRN: [id number]     Advanced Heart Failure Rounding Note  PCP-Cardiologist: No primary care provider on file.   Subjective:    - 12/30: VV ECMO cannulation - 12/31: Left chest tube replaced - 1/2: Extubated. Echo with EF 60-65%, mildly dilated RV with mildly decreased systolic function.  - 1/4: Agitated, suspected aspiration.  Re-intubated.  - 1/5: ECMO cannula repositioned under TEE guidance. TEE showed moderately dilated/moderate-severely dysfunctional RV in setting of hypoxemia. LUE DVT found.  Patient got 1/2 dose of TPA due to initial concern for large PE.  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  - 1/6: Tracheostomy - 1/7: Echo with mild RV dilation/mild RV dysfunction.  - 1/16: Left chest tube out - 1/17: LUE arterial dopplers repeated, showed no obstruction.  - 1/20: Echo with EF 65-70%, mildly D-shaped septum, mildly dilated and mildly dysfunctional RV.  - 1/22: CTA chest: Bilateral upper lobe PEs (suspect chronic), changes c/w ARDS - 1/23: Bronchoscopy showed semi-occlusive ?mass/polyp in the trachea.  - 1/24: Bronchoscopy showed resolution of 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 - 1/26: ECMO circuit changed.  - 2/1: CT chest showed diffuse bronchiectasis as well as diffuse opacity consistent with COVID-19 PNA with ARDS. - 2/3: Trach exchange - 2/5: Decannulated to HFNC - 2/6: Enterococcal PNA/bacteremia.  Echo showed EF 65-70%, normal-appearing RV, no vegetation noted.  - 2/7: TEE with no vegetation, EF 55-60%, RV low normal function with normal size.  - 2/16: Right thoracentesis 500 cc - 2/22: ECMO circuit changed.  ECMO cannula repositioned under TEE guidance. TEE with EF 60-65%, normal RV.   On 2 L HFNC this morning.  Eating breakfast.  Has been doing great with PT.  CXR with stable bilateral infiltrates. Unfortunately, PaCO2 has remained high, sweep back up to 7.   Weight is up, he feels puffy. Eating a  lot and getting tube feeds.    ECMO parameters: 3450 rpm Flow 4.8 L/min Pvenous -80 Delta P 22 Sweep 7 HFNC 2 L  ABG 7.37/60/136/99% LDH 329 PTT 57 Lactate 0.8 Hgb 9.3  Objective:   Weight Range: 79.4 kg Body mass index is 30.05 kg/m.   Vital Signs:   Temp:  [98 F (36.7 C)-98.6 F (37 C)] 98.2 F (36.8 C) (02/24 0400) Pulse Rate:  [85-120] 120 (02/24 0700) Resp:  [5-37] 35 (02/24 0700) BP: (93-156)/(58-97) 107/65 (02/24 0400) SpO2:  [95 %-100 %] 98 % (02/24 0700) Arterial Line BP: (87-282)/(48-115) 282/115 (02/24 0700) Weight:  [79.4 kg] 79.4 kg (02/24 0600) Last BM Date: 05/05/20  Weight change: Filed Weights   05/04/20 0500 05/05/20 0600 05/06/20 0600  Weight: 78.8 kg 78.1 kg 79.4 kg    Intake/Output:   Intake/Output Summary (Last 24 hours) at 05/06/2020 0744 Last data filed at 05/06/2020 0600 Gross per 24 hour  Intake 1268.05 ml  Output 2750 ml  Net -1481.95 ml      Physical Exam    General: NAD Neck: No JVD, no thyromegaly or thyroid nodule.  Lungs: Clear to auscultation bilaterally with normal respiratory effort. CV: Nondisplaced PMI.  Heart regular S1/S2, no S3/S4, no murmur.  1+ edema 1/2 to knees bilaterally Abdomen: Soft, nontender, no hepatosplenomegaly, no distention.  Skin: Intact without lesions or rashes.  Neurologic: Alert and oriented x 3.  Psych: Normal affect. Extremities: Digital gangrene left hand.  HEENT: Normal.    Telemetry   NSR 100s. Personally reviewed   Labs  CBC Recent Labs    05/05/20 1617 05/05/20 2001 05/06/20 0353 05/06/20 0357 05/06/20 0652  WBC 11.2*  --  8.9  --   --   HGB 9.1*   < > 9.3* 8.8* 8.8*  HCT 28.6*   < > 28.3* 26.0* 26.0*  MCV 95.7  --  93.7  --   --   PLT 260  --  236  --   --    < > = values in this interval not displayed.   Basic Metabolic Panel Recent Labs    34/74/25 1617 05/05/20 2001 05/06/20 0353 05/06/20 0357 05/06/20 0652  NA 133*   < > 135 135 134*  K 4.3   < >  4.2 4.2 4.1  CL 94*  --  95*  --   --   CO2 31  --  31  --   --   GLUCOSE 236*  --  138*  --   --   BUN 31*  --  33*  --   --   CREATININE 0.70  --  0.79  --   --   CALCIUM 8.8*  --  8.8*  --   --    < > = values in this interval not displayed.   Liver Function Tests Recent Labs    05/06/20 0353  AST 25  ALT 51*  ALKPHOS 161*  BILITOT 0.8  PROT 5.6*  ALBUMIN 2.9*   No results for input(s): LIPASE, AMYLASE in the last 72 hours. Cardiac Enzymes No results for input(s): CKTOTAL, CKMB, CKMBINDEX, TROPONINI in the last 72 hours.  BNP: BNP (last 3 results) No results for input(s): BNP in the last 8760 hours.  ProBNP (last 3 results) No results for input(s): PROBNP in the last 8760 hours.   D-Dimer No results for input(s): DDIMER in the last 72 hours. Hemoglobin A1C No results for input(s): HGBA1C in the last 72 hours. Fasting Lipid Panel No results for input(s): CHOL, HDL, LDLCALC, TRIG, CHOLHDL, LDLDIRECT in the last 72 hours. Thyroid Function Tests No results for input(s): TSH, T4TOTAL, T3FREE, THYROIDAB in the last 72 hours.  Invalid input(s): FREET3  Other results:   Imaging    DG CHEST PORT 1 VIEW  Result Date: 05/06/2020 CLINICAL DATA:  ECMO, COVID EXAM: PORTABLE CHEST 1 VIEW COMPARISON:  Radiograph 05/05/2020 FINDINGS: ECMO cannula the again terminates in the inferior vena cava. Transesophageal tube in stable satisfactory positioning. Left upper extremity PICC terminates in the mid to lower SVC. Telemetry leads and support devices overlie the chest. Redemonstration of the dense bilateral perihilar and basilar predominant airspace opacities with layering effusions. Some minimal mid lung clearing on the right. No discernible pneumothorax. Cardiomediastinal silhouette remains largely obscured. No acute osseous or soft tissue abnormality. IMPRESSION: 1. Lines and tubes are stable. 2. Redemonstration of dense bilateral perihilar and basilar predominant airspace  opacities with layering effusions. Some minimal clearing on the right. Electronically Signed   By: Kreg Shropshire M.D.   On: 05/06/2020 06:54     Medications:     Scheduled Medications: . sodium chloride   Intravenous Once  . acetaZOLAMIDE  250 mg Per Tube BID  . bethanechol  10 mg Per Tube TID  . carvedilol  25 mg Per Tube BID WC  . chlorhexidine  15 mL Mouth Rinse BID  . Chlorhexidine Gluconate Cloth  6 each Topical Daily  . chlorpheniramine-HYDROcodone  5 mL Per Tube Q12H  . clonazePAM  0.5 mg Oral Daily  . clonazePAM  1 mg Oral QHS  . dextromethorphan  30 mg Per Tube TID  . diltiazem  30 mg Oral Q12H  . docusate  100 mg Per Tube BID  . doxazosin  4 mg Per Tube Daily  . feeding supplement  237 mL Oral TID BM  . feeding supplement (PIVOT 1.5 CAL)  1,000 mL Per Tube Q24H  . fiber  1 packet Per Tube BID  . furosemide  40 mg Intravenous Q8H  . gabapentin  300 mg Oral BID  . insulin aspart  0-20 Units Subcutaneous Q4H  . insulin aspart  4 Units Subcutaneous 3 times per day  . insulin detemir  25 Units Subcutaneous Daily  . insulin detemir  40 Units Subcutaneous QHS  . ipratropium-albuterol  3 mL Nebulization BID  . liver oil-zinc oxide   Topical 5 X Daily  . mouth rinse  15 mL Mouth Rinse q12n4p  . melatonin  5 mg Oral QHS  . metoCLOPramide (REGLAN) injection  10 mg Intravenous Q8H  . pantoprazole sodium  40 mg Per Tube QHS  . QUEtiapine  50 mg Oral QHS  . rosuvastatin  40 mg Oral Daily  . sennosides  10 mL Per Tube QHS  . sertraline  25 mg Oral Daily  . sodium chloride flush  10-40 mL Intracatheter Q12H    Infusions: . sodium chloride    . sodium chloride Stopped (04/29/20 1611)  . albumin human Stopped (04/23/20 0041)  . bivalirudin (ANGIOMAX) infusion 0.5 mg/mL (Non-ACS indications)      PRN Medications: Place/Maintain arterial line **AND** sodium chloride, acetaminophen (TYLENOL) oral liquid 160 mg/5 mL, albumin human, [DISCONTINUED] lidocaine **AND** albuterol,  clonazePAM, Gerhardt's butt cream, guaiFENesin, hydrALAZINE, labetalol, lip balm, ondansetron (ZOFRAN) IV, oxyCODONE, phenol, polyethylene glycol, promethazine-codeine, simethicone, sodium chloride, sodium chloride flush   Assessment/Plan   1. Acute hypoxemic respiratory failure: Due to COVID-19 PNA with bilateral infiltrates.  Refractory hypoxemia, VV-ECMO cannulation on 02/11/2020 with improvement in oxygenation.  Developed left PTX post-subclavian CVL and had left chest tube, the left lung is re-expanded and CT out.  He was extubated 1/2 but reintubated 1/4 with agitation and suspected aspiration.  Tracheostomy 1/6.  CTA chest 1/22 with suspected chronic PEs and ARDS. ECMO cannula repositioned 1/5. ECMO circuit changed 1/26. Extubated to HFNC on 2/5. ECMO circuit changed and cannula repositioned 2/22.  LDH stable. Sweep at 7, have struggled with hypercarbia from significant dead space ventilation.  Now off abx. Weight is up. ABG ok today.  - Continue attempts at sweep weaning.  - Continue acetazolamide 250 mg bid and increase Lasix to 40 mg IV every 8 hrs today.   - Patient has had remdesivir, tocilizumab. - Completed steroid taper. - Continue bivalirudin, goal PTT 65-80.  PTT 57. Discussed dosing with PharmD personally. - Continue to mobilize. - Coreg + low dose diltiazem continues at 25 mg bid to increase fractional flow via ECMO circuit.  - Due to lack of insurance, lung transplant currently is not an option.  2. 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  Clot noted on TEE in RV as well.  TTE 1/2 showed normal EF 60-65%, RV improved (mildly dilated/dysfunctional). TEE on 1/5 with moderate to severe RV dysfunction but patient was hypoxemic.  Had 1/2 dose TPA on 1/5. Echo 1/20 with mildly dilated/mildly dysfunctional RV. CTA chest 1/22 with chronic-appearing PEs in upper lobes. - PTT 57. Bivalirudin for goal PTT 65-80. Discussed dosing  with PharmD personally. 3.  Left PTX: Left chest tube, lung is re-expanded. Tube now out, stable CXR. 4. Shock: Suspect septic/distributive.  Now resolved, off NE.  5. Anemia: Hgb 9.3, transfuse < 7.5.  6. AKI: Resolved 7. Hyperglycemia: insulin.  8. HTN: Controlled.     - Continue Coreg 25 mg bid.   - Diltiazem 30 bid.  9. CHB: Episode of CHB when hypoxemic and with cough (suspect vagal).  NSR since then.   He has occasional short vagal episodes.  - Continue Coreg/diltiazem, watch rhythm.  10. Thrombocytopenia: Resolved  11. FEN: He is eating well, stop tube feeds.  12. Ischemic digits: LUE.  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  Repeat study 1/17 showed no obstruction.  - Wound Care following. 13. ID: Group F Strep and Enterococcus faecalis in sputum. Initially completed abx for these bacteria. Now with recurrent sepsis, Enterococcus faecalis in blood now. Vancomycin restarted and continued to 2/21. TEE 2/7 with no vegetation. Currently no abx.  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   - resolved 15. Hypernatremia: Resolved. Continue free water. No change 16. Depression: Started sertraline 25 mg daily.   CRITICAL CARE Performed by: Marca Ancona  Total critical care time: 35 minutes  Critical care time was exclusive of separately billable procedures and treating other patients.  Critical care was necessary to treat or prevent imminent or life-threatening deterioration.  Critical care was time spent personally by me on the following activities: development of treatment plan with patient and/or surrogate as well as nursing, discussions with consultants, evaluation of patient's response to treatment, examination of patient, obtaining history from patient or surrogate, ordering and performing treatments and interventions, ordering and review of laboratory studies,  ordering and review of radiographic studies, pulse oximetry and re-evaluation of patient's condition.    Length of Stay: 21  Marca Ancona, MD  05/06/2020, 7:44 AM  Advanced Heart Failure Team Pager [phone] (M-F; 7a - 4p)  Please contact [facility] for night-coverage after hours (4p -7a ) and weekends on [email or web address]

## 2020-05-07 ENCOUNTER — Inpatient Hospital Stay (HOSPITAL_COMMUNITY): Payer: Medicaid Other

## 2020-05-07 LAB — BASIC METABOLIC PANEL
Anion gap: 11 (ref 5–15)
Anion gap: 9 (ref 5–15)
BUN: 28 mg/dL — ABNORMAL HIGH (ref 6–20)
BUN: 24 mg/dL — ABNORMAL HIGH (ref 6–20)
CO2: 32 mmol/L (ref 22–32)
CO2: 31 mmol/L (ref 22–32)
Calcium: 9.1 mg/dL (ref 8.9–10.3)
Calcium: 8.6 mg/dL — ABNORMAL LOW (ref 8.9–10.3)
Chloride: 93 mmol/L — ABNORMAL LOW (ref 98–111)
Chloride: 93 mmol/L — ABNORMAL LOW (ref 98–111)
Creatinine, Ser: 0.68 mg/dL (ref 0.61–1.24)
Creatinine, Ser: 0.94 mg/dL (ref 0.61–1.24)
GFR, Estimated: >60 mL/min (ref >60)
GFR, Estimated: >60 mL/min (ref >60)
Glucose, Bld: 68 mg/dL — ABNORMAL LOW (ref 70–99)
Glucose, Bld: 189 mg/dL — ABNORMAL HIGH (ref 70–99)
Potassium: 3.4 mmol/L — ABNORMAL LOW (ref 3.5–5.1)
Potassium: 4 mmol/L (ref 3.5–5.1)
Sodium: 136 mmol/L (ref 135–145)
Sodium: 133 mmol/L — ABNORMAL LOW (ref 135–145)

## 2020-05-07 LAB — CBC
HCT: 24.2 % — ABNORMAL LOW (ref 39.0–52.0)
HCT: 26.1 % — ABNORMAL LOW (ref 39.0–52.0)
Hemoglobin: 8.3 g/dL — ABNORMAL LOW (ref 13.0–17.0)
Hemoglobin: 8.7 g/dL — ABNORMAL LOW (ref 13.0–17.0)
MCH: 30.9 pg (ref 26.0–34.0)
MCH: 31.1 pg (ref 26.0–34.0)
MCHC: 34.3 g/dL (ref 30.0–36.0)
MCHC: 33.3 g/dL (ref 30.0–36.0)
MCV: 90 fL (ref 80.0–100.0)
MCV: 93.2 fL (ref 80.0–100.0)
Platelets: 246 10*3/uL (ref 150–400)
Platelets: 265 10*3/uL (ref 150–400)
RBC: 2.69 MIL/uL — ABNORMAL LOW (ref 4.22–5.81)
RBC: 2.8 MIL/uL — ABNORMAL LOW (ref 4.22–5.81)
RDW: 15.6 % — ABNORMAL HIGH (ref 11.5–15.5)
RDW: 15.5 % (ref 11.5–15.5)
WBC: 6.6 10*3/uL (ref 4.0–10.5)
WBC: 9.2 10*3/uL (ref 4.0–10.5)
nRBC: 0 % (ref 0.0–0.2)
nRBC: 0 % (ref 0.0–0.2)

## 2020-05-07 LAB — LACTATE DEHYDROGENASE: LDH: 301 U/L — ABNORMAL HIGH (ref 98–192)

## 2020-05-07 LAB — POCT I-STAT 7, (LYTES, BLD GAS, ICA,H+H)
Acid-Base Excess: 12 mmol/L — ABNORMAL HIGH (ref 0.0–2.0)
Acid-Base Excess: 12 mmol/L — ABNORMAL HIGH (ref 0.0–2.0)
Acid-Base Excess: 8 mmol/L — ABNORMAL HIGH (ref 0.0–2.0)
Acid-Base Excess: 7 mmol/L — ABNORMAL HIGH (ref 0.0–2.0)
Acid-Base Excess: 9 mmol/L — ABNORMAL HIGH (ref 0.0–2.0)
Bicarbonate: 35.4 mmol/L — ABNORMAL HIGH (ref 20.0–28.0)
Bicarbonate: 37 mmol/L — ABNORMAL HIGH (ref 20.0–28.0)
Bicarbonate: 35.1 mmol/L — ABNORMAL HIGH (ref 20.0–28.0)
Bicarbonate: 34.9 mmol/L — ABNORMAL HIGH (ref 20.0–28.0)
Bicarbonate: 35.6 mmol/L — ABNORMAL HIGH (ref 20.0–28.0)
Calcium, Ion: 1.23 mmol/L (ref 1.15–1.40)
Calcium, Ion: 1.21 mmol/L (ref 1.15–1.40)
Calcium, Ion: 1.21 mmol/L (ref 1.15–1.40)
Calcium, Ion: 1.24 mmol/L (ref 1.15–1.40)
Calcium, Ion: 1.23 mmol/L (ref 1.15–1.40)
HCT: 23 % — ABNORMAL LOW (ref 39.0–52.0)
HCT: 24 % — ABNORMAL LOW (ref 39.0–52.0)
HCT: 27 % — ABNORMAL LOW (ref 39.0–52.0)
HCT: 28 % — ABNORMAL LOW (ref 39.0–52.0)
HCT: 25 % — ABNORMAL LOW (ref 39.0–52.0)
Hemoglobin: 7.8 g/dL — ABNORMAL LOW (ref 13.0–17.0)
Hemoglobin: 8.2 g/dL — ABNORMAL LOW (ref 13.0–17.0)
Hemoglobin: 9.2 g/dL — ABNORMAL LOW (ref 13.0–17.0)
Hemoglobin: 9.5 g/dL — ABNORMAL LOW (ref 13.0–17.0)
Hemoglobin: 8.5 g/dL — ABNORMAL LOW (ref 13.0–17.0)
O2 Saturation: 100 %
O2 Saturation: 99 %
O2 Saturation: 95 %
O2 Saturation: 96 %
O2 Saturation: 97 %
Patient temperature: 36.9
Patient temperature: 36.9
Patient temperature: 37
Patient temperature: 37
Patient temperature: 37.1
Potassium: 3.4 mmol/L — ABNORMAL LOW (ref 3.5–5.1)
Potassium: 3.5 mmol/L (ref 3.5–5.1)
Potassium: 4.3 mmol/L (ref 3.5–5.1)
Potassium: 4.4 mmol/L (ref 3.5–5.1)
Potassium: 4.3 mmol/L (ref 3.5–5.1)
Sodium: 134 mmol/L — ABNORMAL LOW (ref 135–145)
Sodium: 133 mmol/L — ABNORMAL LOW (ref 135–145)
Sodium: 133 mmol/L — ABNORMAL LOW (ref 135–145)
Sodium: 133 mmol/L — ABNORMAL LOW (ref 135–145)
Sodium: 132 mmol/L — ABNORMAL LOW (ref 135–145)
TCO2: 37 mmol/L — ABNORMAL HIGH (ref 22–32)
TCO2: 39 mmol/L — ABNORMAL HIGH (ref 22–32)
TCO2: 37 mmol/L — ABNORMAL HIGH (ref 22–32)
TCO2: 37 mmol/L — ABNORMAL HIGH (ref 22–32)
TCO2: 37 mmol/L — ABNORMAL HIGH (ref 22–32)
pCO2 arterial: 43.1 mmHg (ref 32.0–48.0)
pCO2 arterial: 52.6 mmHg — ABNORMAL HIGH (ref 32.0–48.0)
pCO2 arterial: 62 mmHg — ABNORMAL HIGH (ref 32.0–48.0)
pCO2 arterial: 67.5 mmHg (ref 32.0–48.0)
pCO2 arterial: 62.3 mmHg — ABNORMAL HIGH (ref 32.0–48.0)
pH, Arterial: 7.523 — ABNORMAL HIGH (ref 7.350–7.450)
pH, Arterial: 7.455 — ABNORMAL HIGH (ref 7.350–7.450)
pH, Arterial: 7.361 (ref 7.350–7.450)
pH, Arterial: 7.322 — ABNORMAL LOW (ref 7.350–7.450)
pH, Arterial: 7.366 (ref 7.350–7.450)
pO2, Arterial: 188 mmHg — ABNORMAL HIGH (ref 83.0–108.0)
pO2, Arterial: 145 mmHg — ABNORMAL HIGH (ref 83.0–108.0)
pO2, Arterial: 82 mmHg — ABNORMAL LOW (ref 83.0–108.0)
pO2, Arterial: 88 mmHg (ref 83.0–108.0)
pO2, Arterial: 95 mmHg (ref 83.0–108.0)

## 2020-05-07 LAB — PROTIME-INR
INR: 1.5 — ABNORMAL HIGH (ref 0.8–1.2)
Prothrombin Time: 17.5 seconds — ABNORMAL HIGH (ref 11.4–15.2)

## 2020-05-07 LAB — GLUCOSE, CAPILLARY
Glucose-Capillary: 64 mg/dL — ABNORMAL LOW (ref 70–99)
Glucose-Capillary: 69 mg/dL — ABNORMAL LOW (ref 70–99)
Glucose-Capillary: 88 mg/dL (ref 70–99)
Glucose-Capillary: 84 mg/dL (ref 70–99)
Glucose-Capillary: 75 mg/dL (ref 70–99)
Glucose-Capillary: 232 mg/dL — ABNORMAL HIGH (ref 70–99)
Glucose-Capillary: 158 mg/dL — ABNORMAL HIGH (ref 70–99)
Glucose-Capillary: 179 mg/dL — ABNORMAL HIGH (ref 70–99)
Glucose-Capillary: 230 mg/dL — ABNORMAL HIGH (ref 70–99)

## 2020-05-07 LAB — LACTIC ACID, PLASMA
Lactic Acid, Venous: 0.8 mmol/L (ref 0.5–1.9)
Lactic Acid, Venous: 1.6 mmol/L (ref 0.5–1.9)

## 2020-05-07 LAB — APTT
aPTT: 68 seconds — ABNORMAL HIGH (ref 24–36)
aPTT: 66 seconds — ABNORMAL HIGH (ref 24–36)

## 2020-05-07 LAB — FIBRINOGEN: Fibrinogen: 356 mg/dL (ref 210–475)

## 2020-05-07 MED ORDER — GABAPENTIN 300 MG PO CAPS
300.0000 mg | ORAL_CAPSULE | Freq: Two times a day (BID) | ORAL | Status: DC
  Administered 2020-05-07 – 2020-05-08 (×4): 300 mg via ORAL
  Filled 2020-05-07 (×4): qty 1

## 2020-05-07 MED ORDER — POLYETHYLENE GLYCOL 3350 17 G PO PACK: 17.0000 g | PACK | Freq: Every day | ORAL | Status: DC | PRN

## 2020-05-07 MED ORDER — DOCUSATE SODIUM 100 MG PO CAPS
100.0000 mg | ORAL_CAPSULE | Freq: Two times a day (BID) | ORAL | Status: DC
  Administered 2020-05-07 – 2020-05-08 (×4): 100 mg via ORAL
  Filled 2020-05-07 (×4): qty 1

## 2020-05-07 MED ORDER — INSULIN DETEMIR 100 UNIT/ML ~~LOC~~ SOLN
20.0000 [IU] | Freq: Every day | SUBCUTANEOUS | Status: DC
  Administered 2020-05-07 – 2020-05-08 (×2): 20 [IU] via SUBCUTANEOUS
  Filled 2020-05-07 (×3): qty 0.2

## 2020-05-07 MED ORDER — POTASSIUM CHLORIDE 20 MEQ PO PACK
40.0000 meq | PACK | Freq: Three times a day (TID) | ORAL | Status: AC
  Administered 2020-05-07 (×2): 40 meq via ORAL
  Filled 2020-05-07 (×2): qty 2

## 2020-05-07 MED ORDER — DOCUSATE SODIUM 50 MG/5ML PO LIQD: 100.0000 mg | Freq: Two times a day (BID) | ORAL | Status: DC

## 2020-05-07 MED ORDER — SENNA 8.6 MG PO TABS
1.0000 | ORAL_TABLET | Freq: Every day | ORAL | Status: DC
  Administered 2020-05-07 – 2020-05-08 (×2): 8.6 mg via ORAL
  Filled 2020-05-07 (×2): qty 1

## 2020-05-07 MED ORDER — POTASSIUM CHLORIDE 20 MEQ PO PACK
40.0000 meq | PACK | Freq: Once | ORAL | Status: AC
  Administered 2020-05-07: 40 meq via ORAL
  Filled 2020-05-07: qty 2

## 2020-05-07 NOTE — Progress Notes (Signed)
Critical Care Progress Note  NAME:  Francisca Harbuck, MRN:  [id number], DOB:  Aug 10, 1970, LOS: 59 ADMISSION DATE:  02/27/2020, CONSULTATION DATE: 02/13/2020 REFERRING MD: Wynona Neat -LBPCCM, CHIEF COMPLAINT: Respiratory failure requiring ECMO  HPI/course in hospital  50 year old man admitted to hospital 12/28 with 1 week history of dyspnea cough nausea and vomiting.  Initially admitted to [facility] and placed on high flow nasal cannula but rapidly failed and required intubation 12/29.  Persistent hypoxic respiratory failure with PF ratio 55 in spite of 18 of PEEP FiO2 0.1 despite paralytics.  Did not improve with prone ventilation  Cannulated for VV ECMO 12/30 via right IJ crescent cannula.  ECMO circuit was changed on 04/07/2020 Iatrogenic pneumothorax from left subclavian triple-lumen placement  12/28 admitted covid, ARDS 12/30 ECMO cannulation, iatrogenic pneumothrorax, DVT, Bivalrudin started           Left subclavian hematoma, chest tube, ceftriaxone/ azithromycin started 12/31 1Unit PRBC 1/1 Palliative consult, noted HTN, fentanyl only 1/2 Extubation I/O positive, left lung re-expanded, EF 60-65%, Lasix 20mg  BID 1/3 BiPAP in place, HTN to 190s, 200s, labetolol for BP, I/O even, cefepime and vancomycin started 1/4 agitation off BiPAP, possible aspiration 1/5 agitation overnight, reintubated      ECHO with RV dilation, ECMO cannula repositioned, LUE DVT (+), 1/2 dose tPA given,      I/O up, on lasix, Epi, NE, vasopressin started, HR/ rhythm issues noted 1/6 Tracheostomy, hypoglycemic when off tube feeds       Cortrack tune issues 1/7 on lasix gtt 4mg /hr with diuresis, agitation improved 1/8 starting steroid taper 1/9 severe agitation, heavy sedation required, lasix gtt 6mg /hr, I/Os (-)       precedex and fentanyl 1/10 Vanc stopped 1/11 sweep to 2, lasix gtt at 4mg /hr, weight up, metoprolol added for HTN 1/12 fevers to 99 noted, lasix gtt and metazolone 1/13 lasix gtt  dced 1/14 intermittent HTN, possibly flash pulmonary edema tied to sedation        Ischemic left hand changes, 1 Unit PRBCs 1/15 1/16 sweep from 4 to 2.5, chest tube removed 1/17 sweep at 6, trial of nebulized morphine for cough, LUE dopplers show no obstruction 1/18 two events of 2nd degree type II HB noted with coughing         sweep at 4, IV lasix 40, acetazolamide, distal XLT trach placement         agitation and BP spikes 1/19 agitation, air hunger, seroquel, klonopin, oxy, valproate, ketamine trial 1/20 sweep at 3.5, diuresis results in chugging, albumin given         1 Unit PRBCs, lidocaine nebulizers for cough, ancef started 1/21 sweep to 8, anxiety, I/Os (+) with lasix and acetazolamide 1/22 sweep at 5, chest CT bilateral upper lobe PEs 1/23 sweep at 5, delirium, I/Os even with lasix and acetazolamide         continued coughing, 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 1/24 sweep at 7.5, 1 Unit PRBCs, repeat bronchoscopy showed resolution of 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 1/25 sweep at 7.5, on propofol, lasix gtt at 14ml/hr, I/Os (+)         nebulized morphine and lidocaine for cough 1/26 sweep at 8, propofol/ precedex, cough present when weaned, lasix gtt at 57ml/hr        ECMO circuit changes, levophed started, 1 Unit PRBCs 1/27 sweep at 4, off sedation, delirium, I/Os even        lasix gtt restated, acetazolamide overnight 1/28 sweep at 7.5,  HTN labile 1/29 sweep of 6, patient reports a tickle in throat, cetacaine 1/30 sweep down to 6, cough improved with cetacaine and gabapentin 1/31 sweep at 5, agitation, off acetazolamide, lasix, weight up 2/1 cough, of pulmicort, cetacaine for cough, continued gabapentin, dexmethorphan       CT demonstrated trach placement against back of trachea 2/2 Trach collar trial somewhat effective in managing cough       low dose diltiazem for HR,  2/3 sweep at 4,trach cannula replacement (longer, more flexible bivona); brochoscopy  shows irritated mucosa       IV lasix and acetazolamide 2/4 sweep at 5, I/Os (+), BUN elevated, lasix gtt with diamox,       trach decannulation, increased gabapentin for cough 2/5 start HCAP coverage for rising WBC/temps, check Pct/cultures, CXR worse with lack of PEEP, diuresed well but started getting very hypotensive, sepsis vs. Too dry 04/18/20 blood cx grew E faecalis 2/7 PICC change, TEE 2/7>> sweep weans 2/22 Cannula adjustment, circuit change 2/25 up walking in the hallway with physical therapy  Past Medical History  none  Interim history/subjective:   Doing well.  Core track removed eating food.  Walked yesterday in the hallway.  Objective   Blood pressure 136/76, pulse (!) 113, temperature 98.4 F (36.9 C), temperature source Oral, resp. rate (!) 28, height 5\' 4"  (1.626 m), weight 77.8 kg, SpO2 97 %.        Intake/Output Summary (Last 24 hours) at 05/07/2020 0914 Last data filed at 05/07/2020 0800 Gross per 24 hour  Intake 2236.86 ml  Output 3600 ml  Net -1363.14 ml   Filed Weights   05/05/20 0600 05/06/20 0600 05/07/20 0700  Weight: 78.1 kg 79.4 kg 77.8 kg    Examination: Constitutional: Sitting up in chair Eyes: Tracking appropriately Ears, mucous membranes moist, prior trach site clear Cardiovascular: Regular rate rhythm, S1-S2 Respiratory: Bilateral breath sounds no crackles no wheeze Gastrointestinal: Soft nontender nondistended Skin: No rash Neurologic: Moves all 4 extremities Psychiatric: RASS 0 Ext: Dependent edema, trace edema bilateral lower extremities   Labs reviewed  Chest x-ray 05/07/2020: Bilateral airspace disease.  No significant change from prior. The patient's images have been independently reviewed by me.    Assessment & Plan:   Acute hypoxemic/hypercapnic respiratory failure due to severe ARDS from COVID-19 pneumonia Probable acute PE, and RV dysfunction s/p TPA Refractory coughing- improved after trach removal but still an issue  intermittently Strep group F/ enterococcal pneumonia- s/p 10 days vanc 1/29; recurrent with bacteremia on 2/5 now s/p vanc x 14 days R effusion- lymphocyte predominant transudate drained 2/16 Plan: Continue VV ECMO support, weaning of sweep as tolerated Continue supplemental oxygen via nasal cannula Continue to wean sleep which is seeming to be our barrier. Continue I-S flutter mobility -Continue diuresis to maintain euvolemia  HTN.  Trials of reducing HR/BP have resulted in reduced pCO2 (likely related to fractional flow through ECMO circuit) -Continue Coreg  Hyponatremia- I Continue to observe, diuresis  Acute delirium, with agitation- improved. Pain control with oxycodone -Gabapentin plus Klonopin, continue to decrease -Continue Seroquel nightly  Sepsis secondary to E faecalis bacteremia- secondary to PNA, has grown enterococcus in sputum prior, previous vanc course completed 1/29. TEE on 2/7 without vegetations. PICC changed on 2/7.  14 day vanc therapy from PICC change completed 2/21. Plan: Continue to observe for any sign of fever  Gastroparesis with some element ileus: improved but occasional vomiting spells with coughing attacks.  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  - wound care following, appreciate dressing recommendations Plan: Keep arterial line out of the left arm  R arm swelling- duplex neg, NTD here, going to space out cuff measurements since we have arterial line  Hyperglycemia due to diabetes type II -Plan: With discontinuing tube feeds we will discontinue tube feed insulin coverage Decrease the Levemir  Acute urinary retention- recurrent issue Plan: Continue Cardura Intermittent bladder scans as needed Trying to avoid repeat Foley  Deconditioning -Continue rehabilitation with PT OT and SLP  Destination of therapy-   Plan: VV ECMO with bridge to recovery  Daily Goals Checklist  Pain/Anxiety/Delirium protocol (if indicated): see above VAP protocol (if indicated):  n/a DVT prophylaxis: bivalirudin GI prophylaxis: pantoprazole Glucose control: basal bolus insulin Mobility/therapy needs: Mobilization as tolerated Code Status: Full code Disposition: ICU  I spent 32 minutes dedicated to the care of this patient on the date of this encounter to include pre-visit review of records, face-to-face time with the patient discussing conditions above, post visit ordering of testing, clinical documentation with the electronic health record, making appropriate referrals as documented, and communicating necessary findings to members of the patients care team.    Josephine Igo, DO [facility] 05/07/2020 9:14 AM

## 2020-05-07 NOTE — Progress Notes (Signed)
ANTICOAGULATION CONSULT NOTE  Pharmacy Consult for bivalirudin Indication: ECMO and VTE  Labs: Recent Labs    05/05/20 0407 05/05/20 0408 05/06/20 0353 05/06/20 0357 05/06/20 1613 05/06/20 1620 05/07/20 0422 05/07/20 0427 05/07/20 0624  HGB 8.7*   < > 9.3*   < > 8.7*   < > 7.8* 8.3* 8.2*  HCT 27.5*   < > 28.3*   < > 26.7*   < > 23.0* 24.2* 24.0*  PLT 230   < > 236  --  251  --   --  246  --   APTT 59*   < > 57*  --  66*  --   --  68*  --   LABPROT 17.0*  --  16.7*  --   --   --   --  17.5*  --   INR 1.4*  --  1.4*  --   --   --   --  1.5*  --   CREATININE 0.70   < > 0.79  --  0.84  --   --  0.68  --    < > = values in this interval not displayed.    Assessment: 89 yoM admitted with COVID-19 PNA with worsening hypoxia, s/p cannulation for ECMO. Pt was started on IV heparin prior to cannulation due to acute DVTs and possible PE, transitioned to bivalirudin with ECMO. Now s/p tPA on 1/5 and tracheostomy on 1/6. ECMO circuit changed on 05/03/2020  aptt within goal, LDH= 301, fibrinogen= 356  Goal of Therapy:  aPTT 60-80 seconds   Plan:  -continue bivalirudin to 0.095 mg/kg/hr (using order-specific wt 72.1kg) -Monitor q12h aPTT/CBC, LDH, and for s/sx of bleeding  Harland German, PharmD Clinical Pharmacist **Pharmacist phone directory can now be found on [email or web address] ([email or web address]).  Listed under [facility].

## 2020-05-07 NOTE — Progress Notes (Signed)
Physical Therapy Treatment Patient Details Name: Alex [name] MRN: [id number] DOB: Oct 12, 1970 Today's Date: 05/07/2020    History of Present Illness Pt is 50 y.o. male  with no significant past medical history admitted on 03/05/2020 with dyspnea, cough, nausea/vomiting ~ 1 week ago with worsening symptoms of body aches and fatigue and +COVID 02/07/20 and admitted with shortness of breath. Required intubation 03/10/20. Cannulated for VV ECMO 04/01/20. Oxygenating better with ECMO. Also evidence of RLE DVT, RV thrombus, and high suspicion of PE. Has required chest tube to L lung for collapse which needs further reposition with recurrent collapse. Extubated 03-15-19.  Reintubated 1/5 and trach placed 1/6. Decannulated 04/16/20.    PT Comments    Pt up in chair on entry, very alert and carrying on in depth conversation with nursing staff about "[facility]", and performed well today, however feel pt with decreased stamina after sitting up in chair in morning. Will change therapy time to earlier in morning to maximize endurance. Pt did work very hard today and was able to do 5x sit<>stand between ambulation bouts with only min guard assist. Pt is also ambulating mostly with contact guard assist and only needs assist as he fatigues at end of ambulation bouts. Pt able to ambulate 270 feet today. RN/ECMO specialist asked about walking over the weekend. Request they hold off, because the weekend staff is different and pt still needs 5 people to ambulate safely.PT will be back Monday.      Follow Up Recommendations  CIR;Supervision/Assistance - 24 hour     Equipment Recommendations  Other (comment) (TBD (pt still on ECMO))       Precautions / Restrictions Precautions Precautions: Fall Precaution Comments: ECMO, Foley, Pt decannulated himself 2/4 Restrictions Other Position/Activity Restrictions: L platform walker used for mobility due to discomfort with grip to L hand    Mobility  Bed Mobility                General bed mobility comments: OOB in recliner    Transfers Overall transfer level: Needs assistance Equipment used: Left platform walker Transfers: Sit to/from Stand;Stand Pivot Transfers Sit to Stand: Min assist;+2 safety/equipment;Min guard         General transfer comment: 6x sit<>stand during ambulation, first time requiring min A, subsequent bouts min guard with vc for sequencing  Ambulation/Gait Ambulation/Gait assistance: Min assist;+2 physical assistance;Min guard Gait Distance (Feet): 270 Feet (5 bouts of walking) Assistive device: Left platform walker Gait Pattern/deviations: Step-through pattern;Decreased step length - right;Decreased step length - left;Narrow base of support;Trunk flexed;Shuffle Gait velocity: slowed Gait velocity interpretation: <1.31 ft/sec, indicative of household ambulator General Gait Details: pt able to recall       Balance Overall balance assessment: Needs assistance Sitting-balance support: No upper extremity supported;Feet unsupported Sitting balance-Leahy Scale: Fair     Standing balance support: Bilateral upper extremity supported;During functional activity Standing balance-Leahy Scale: Poor Standing balance comment: requires outside support to maintain balance in standing                            Cognition Arousal/Alertness: Awake/alert Behavior During Therapy: WFL for tasks assessed/performed Overall Cognitive Status: Within Functional Limits for tasks assessed                                 General Comments: continued increased cognitive acuity however slightly more fatigued today than yesterday  Exercises Other Exercises Other Exercises: standing in RW carrying on conversation with fellow patient for ~5 min    General Comments General comments (skin integrity, edema, etc.): 6L O2 via [place] and Sweep of 8 for ambulation      Pertinent Vitals/Pain Pain Assessment: Faces Faces  Pain Scale: Hurts a little bit Pain Location: L fingers with pressure Pain Descriptors / Indicators: Discomfort;Grimacing;Guarding Pain Intervention(s): Limited activity within patient's tolerance;Monitored during session;Repositioned           PT Goals (current goals can now be found in the care plan section) Acute Rehab PT Goals Patient Stated Goal: continue to walk PT Goal Formulation: With patient Time For Goal Achievement: 05/10/20 Potential to Achieve Goals: Fair Progress towards PT goals: Progressing toward goals    Frequency    Min 5X/week      PT Plan Current plan remains appropriate       AM-PAC PT "6 Clicks" Mobility   Outcome Measure  Help needed turning from your back to your side while in a flat bed without using bedrails?: A Lot Help needed moving from lying on your back to sitting on the side of a flat bed without using bedrails?: A Lot Help needed moving to and from a bed to a chair (including a wheelchair)?: A Lot Help needed standing up from a chair using your arms (e.g., wheelchair or bedside chair)?: A Lot Help needed to walk in hospital room?: A Lot Help needed climbing 3-5 steps with a railing? : Total 6 Click Score: 11    End of Session Equipment Utilized During Treatment: Gait belt Activity Tolerance: Patient tolerated treatment well Patient left: in chair;with call bell/phone within reach;with nursing/sitter in room Nurse Communication: Mobility status;Other (comment) (scheduled approx 11:30 for daily therapy) PT Visit Diagnosis: Muscle weakness (generalized) (M62.81);Difficulty in walking, not elsewhere classified (R26.2);Other abnormalities of gait and mobility (R26.89);Unsteadiness on feet (R26.81)     Time: 4132-4401 PT Time Calculation (min) (ACUTE ONLY): 70 min  Charges:  $Gait Training: 53-67 mins $Therapeutic Activity: 8-22 mins                     [name] B. Beverely Risen PT, DPT Acute Rehabilitation Services Pager [phone] Office [phone]    [facility]on Al[facility] 05/07/2020, 2:34 PM

## 2020-05-07 NOTE — Plan of Care (Signed)
  Problem: Health Behavior/Discharge Planning: Goal: Ability to manage health-related needs will improve Outcome: Progressing   Problem: Clinical Measurements: Goal: Ability to maintain clinical measurements within normal limits will improve Outcome: Progressing Goal: Will remain free from infection Outcome: Progressing Goal: Diagnostic test results will improve Outcome: Progressing Goal: Respiratory complications will improve Outcome: Progressing Goal: Cardiovascular complication will be avoided Outcome: Progressing   Problem: Activity: Goal: Risk for activity intolerance will decrease Outcome: Progressing   Problem: Coping: Goal: Level of anxiety will decrease Outcome: Progressing   Problem: Pain Managment: Goal: General experience of comfort will improve Outcome: Progressing   Problem: Elimination: Goal: Will not experience complications related to bowel motility Outcome: Progressing Goal: Will not experience complications related to urinary retention Outcome: Progressing

## 2020-05-07 NOTE — Progress Notes (Signed)
Patient ID: Alex [name], male   DOB: 09/01/1970, 50 y.o.   MRN: [id number]     Advanced Heart Failure Rounding Note  PCP-Cardiologist: No primary care provider on file.   Subjective:    - 12/30: VV ECMO cannulation - 12/31: Left chest tube replaced - 1/2: Extubated. Echo with EF 60-65%, mildly dilated RV with mildly decreased systolic function.  - 1/4: Agitated, suspected aspiration.  Re-intubated.  - 1/5: ECMO cannula repositioned under TEE guidance. TEE showed moderately dilated/moderate-severely dysfunctional RV in setting of hypoxemia. LUE DVT found.  Patient got 1/2 dose of TPA due to initial concern for large PE.  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  - 1/6: Tracheostomy - 1/7: Echo with mild RV dilation/mild RV dysfunction.  - 1/16: Left chest tube out - 1/17: LUE arterial dopplers repeated, showed no obstruction.  - 1/20: Echo with EF 65-70%, mildly D-shaped septum, mildly dilated and mildly dysfunctional RV.  - 1/22: CTA chest: Bilateral upper lobe PEs (suspect chronic), changes c/w ARDS - 1/23: Bronchoscopy showed semi-occlusive ?mass/polyp in the trachea.  - 1/24: Bronchoscopy showed resolution of 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 - 1/26: ECMO circuit changed.  - 2/1: CT chest showed diffuse bronchiectasis as well as diffuse opacity consistent with COVID-19 PNA with ARDS. - 2/3: Trach exchange - 2/5: Decannulated to HFNC - 2/6: Enterococcal PNA/bacteremia.  Echo showed EF 65-70%, normal-appearing RV, no vegetation noted.  - 2/7: TEE with no vegetation, EF 55-60%, RV low normal function with normal size.  - 2/16: Right thoracentesis 500 cc - 2/22: ECMO circuit changed.  ECMO cannula repositioned under TEE guidance. TEE with EF 60-65%, normal RV.   On 2 L HFNC this morning.  Eating breakfast.  Has been doing great with PT, walked in hall yesterday.  CXR with stable bilateral infiltrates. Sweep down to 6 today, PaCO2 better.    Lasix increased to q8 yesterday with  volume overload, good diuresis and weight down 4 lbs.   ECMO parameters: 3450 rpm Flow 4.7 L/min Pvenous -88 Delta P 23 Sweep 6 HFNC 2 L  ABG 7.46/53/145/98% LDH 329 => 301 PTT 68 Lactate 0.8 Hgb 8.3  Objective:   Weight Range: 77.8 kg Body mass index is 29.44 kg/m.   Vital Signs:   Temp:  [97.8 F (36.6 C)-99 F (37.2 C)] 98.4 F (36.9 C) (02/25 0400) Pulse Rate:  [90-112] 99 (02/25 0700) Resp:  [0-32] 3 (02/25 0700) BP: (92-131)/(52-79) 105/67 (02/25 0600) SpO2:  [94 %-100 %] 98 % (02/25 0700) Arterial Line BP: (89-284)/(44-103) 284/103 (02/25 0700) Weight:  [77.8 kg] 77.8 kg (02/25 0700) Last BM Date: 05/06/20  Weight change: Filed Weights   05/05/20 0600 05/06/20 0600 05/07/20 0700  Weight: 78.1 kg 79.4 kg 77.8 kg    Intake/Output:   Intake/Output Summary (Last 24 hours) at 05/07/2020 0742 Last data filed at 05/07/2020 0600 Gross per 24 hour  Intake 1785.72 ml  Output 4275 ml  Net -2489.28 ml      Physical Exam    General: NAD Neck: No JVD, no thyromegaly or thyroid nodule.  Lungs: Decreased BS bilaterally. CV: Nondisplaced PMI.  Heart regular S1/S2, no S3/S4, no murmur.  1+ edema 1/2 to knees bilaterally.  Abdomen: Soft, nontender, no hepatosplenomegaly, no distention.  Skin: Intact without lesions or rashes.  Neurologic: Alert and oriented x 3.  Psych: Normal affect. Extremities: No clubbing or cyanosis.  HEENT: Normal.    Telemetry   NSR 110s. Personally reviewed   Labs  CBC Recent Labs    05/06/20 1613 05/06/20 1620 05/07/20 0427 05/07/20 0624  WBC 8.7  --  6.6  --   HGB 8.7*   < > 8.3* 8.2*  HCT 26.7*   < > 24.2* 24.0*  MCV 93.7  --  90.0  --   PLT 251  --  246  --    < > = values in this interval not displayed.   Basic Metabolic Panel Recent Labs    81/01/75 1613 05/06/20 1620 05/07/20 0427 05/07/20 0624  NA 134*   < > 136 133*  K 3.9   < > 3.4* 3.5  CL 93*  --  93*  --   CO2 32  --  32  --   GLUCOSE 283*  --   68*  --   BUN 31*  --  28*  --   CREATININE 0.84  --  0.68  --   CALCIUM 8.7*  --  9.1  --    < > = values in this interval not displayed.   Liver Function Tests Recent Labs    05/06/20 0353  AST 25  ALT 51*  ALKPHOS 161*  BILITOT 0.8  PROT 5.6*  ALBUMIN 2.9*   No results for input(s): LIPASE, AMYLASE in the last 72 hours. Cardiac Enzymes No results for input(s): CKTOTAL, CKMB, CKMBINDEX, TROPONINI in the last 72 hours.  BNP: BNP (last 3 results) No results for input(s): BNP in the last 8760 hours.  ProBNP (last 3 results) No results for input(s): PROBNP in the last 8760 hours.   D-Dimer No results for input(s): DDIMER in the last 72 hours. Hemoglobin A1C No results for input(s): HGBA1C in the last 72 hours. Fasting Lipid Panel No results for input(s): CHOL, HDL, LDLCALC, TRIG, CHOLHDL, LDLDIRECT in the last 72 hours. Thyroid Function Tests No results for input(s): TSH, T4TOTAL, T3FREE, THYROIDAB in the last 72 hours.  Invalid input(s): FREET3  Other results:   Imaging    DG CHEST PORT 1 VIEW  Result Date: 05/07/2020 CLINICAL DATA:  Shortness of breath, on ECMO EXAM: PORTABLE CHEST 1 VIEW COMPARISON:  05/06/2020 FINDINGS: ECMO cannula terminates in the IVC. Left upper extremity PICC tip terminates at the mid SVC. Telemetry leads overlie the chest. Removal of the transesophageal tube seen previously. Persistent heterogeneous opacities throughout both lungs likely with layering effusions. Cardiomediastinal silhouette is largely obscured by overlying opacity. No acute osseous or soft tissue abnormality. IMPRESSION: Persistent heterogeneous opacities throughout both lungs with layering effusions. Appearance not significantly changed from prior. Removal of the transesophageal tube with otherwise stable positioning of support devices. Electronically Signed   By: Kreg Shropshire M.D.   On: 05/07/2020 06:17     Medications:     Scheduled Medications: . (feeding  supplement) PROSource Plus  30 mL Oral TID BM  . sodium chloride   Intravenous Once  . acetaZOLAMIDE  250 mg Oral BID  . bethanechol  10 mg Oral TID  . carvedilol  25 mg Oral BID WC  . chlorhexidine  15 mL Mouth Rinse BID  . Chlorhexidine Gluconate Cloth  6 each Topical Daily  . chlorpheniramine-HYDROcodone  5 mL Oral Q12H  . clonazePAM  0.5 mg Oral Daily  . clonazePAM  1 mg Oral QHS  . dextromethorphan  30 mg Oral TID  . diltiazem  30 mg Oral Daily  . docusate  100 mg Per Tube BID  . doxazosin  4 mg Oral Daily  . feeding supplement  237 mL Oral TID WC & HS  . fiber  1 packet Oral BID  . furosemide  40 mg Intravenous Q8H  . gabapentin  300 mg Oral BID  . insulin aspart  0-20 Units Subcutaneous Q4H  . insulin aspart  4 Units Subcutaneous 3 times per day  . insulin detemir  25 Units Subcutaneous Daily  . insulin detemir  40 Units Subcutaneous QHS  . ipratropium-albuterol  3 mL Nebulization BID  . mouth rinse  15 mL Mouth Rinse q12n4p  . melatonin  5 mg Oral QHS  . metoCLOPramide (REGLAN) injection  10 mg Intravenous Q8H  . multivitamin with minerals  1 tablet Oral Daily  . pantoprazole sodium  40 mg Oral QHS  . QUEtiapine  50 mg Oral QHS  . rosuvastatin  40 mg Oral Daily  . sennosides  10 mL Oral QHS  . sertraline  25 mg Oral Daily  . sodium chloride flush  10-40 mL Intracatheter Q12H    Infusions: . sodium chloride    . sodium chloride Stopped (04/29/20 1611)  . albumin human Stopped (04/23/20 0041)  . bivalirudin (ANGIOMAX) infusion 0.5 mg/mL (Non-ACS indications) 0.095 mg/kg/hr (05/07/20 0600)    PRN Medications: Place/Maintain arterial line **AND** sodium chloride, acetaminophen (TYLENOL) oral liquid 160 mg/5 mL, albumin human, [DISCONTINUED] lidocaine **AND** albuterol, clonazePAM, guaiFENesin, hydrALAZINE, labetalol, lip balm, ondansetron (ZOFRAN) IV, oxyCODONE, phenol, polyethylene glycol, promethazine-codeine, simethicone, sodium chloride, sodium chloride  flush   Assessment/Plan   1. Acute hypoxemic respiratory failure: Due to COVID-19 PNA with bilateral infiltrates.  Refractory hypoxemia, VV-ECMO cannulation on 02/25/2020 with improvement in oxygenation.  Developed left PTX post-subclavian CVL and had left chest tube, the left lung is re-expanded and CT out.  He was extubated 1/2 but reintubated 1/4 with agitation and suspected aspiration.  Tracheostomy 1/6.  CTA chest 1/22 with suspected chronic PEs and ARDS. ECMO cannula repositioned 1/5. ECMO circuit changed 1/26. Extubated to HFNC on 2/5. ECMO circuit changed and cannula repositioned 2/22.  LDH stable. Sweep at 6, have struggled with hypercarbia from significant dead space ventilation.  Now off abx. Weight down with increased Lasix. ABG better. - Continue attempts at sweep weaning, drop to 5.  - Continue acetazolamide 250 mg bid and Lasix 40 mg IV every 8 hrs, decrease back to bid tomorrow.   - Patient has had remdesivir, tocilizumab. - Completed steroid taper. - Continue bivalirudin, goal PTT 65-80.  PTT 68. Discussed dosing with PharmD personally. - Continue to mobilize. - Coreg + low dose diltiazem continues at 25 mg bid to increase fractional flow via ECMO circuit.  - Due to lack of insurance, lung transplant currently is not an option.  2. 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  Clot noted on TEE in RV as well.  TTE 1/2 showed normal EF 60-65%, RV improved (mildly dilated/dysfunctional). TEE on 1/5 with moderate to severe RV dysfunction but patient was hypoxemic.  Had 1/2 dose TPA on 1/5. Echo 1/20 with mildly dilated/mildly dysfunctional RV. CTA chest 1/22 with chronic-appearing PEs in upper lobes. - PTT 68. Bivalirudin for goal PTT 65-80. Discussed dosing with PharmD personally. 3. Left PTX: Left chest tube, lung is re-expanded. Tube now out, stable CXR. 4. Shock: Suspect septic/distributive.  Now resolved, off NE.  5.  Anemia: Hgb 8.3, transfuse < 7.5.  6. AKI: Resolved 7. Hyperglycemia: insulin.  8. HTN: Controlled.     - Continue Coreg 25 mg bid.   - Diltiazem  30 bid.  9. CHB: Episode of CHB when hypoxemic and with cough (suspect vagal).  NSR since then.   He has occasional short vagal episodes.  - Continue Coreg/diltiazem, watch rhythm.  10. Thrombocytopenia: Resolved  11. FEN: He is eating well, tube feeds off and [place] out.  12. Ischemic digits: LUE.  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  Repeat study 1/17 showed no obstruction.  - Wound Care following. 13. ID: Group F Strep and Enterococcus faecalis in sputum. Initially completed abx for these bacteria. Now with recurrent sepsis, Enterococcus faecalis in blood now. Vancomycin restarted and continued to 2/21. TEE 2/7 with no vegetation. Currently no abx.  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   - resolved 15. Hypernatremia: Resolved. Continue free water. No change 16. Depression: Started sertraline 25 mg daily.   CRITICAL CARE Performed by: Marca Ancona  Total critical care time: 35 minutes  Critical care time was exclusive of separately billable procedures and treating other patients.  Critical care was necessary to treat or prevent imminent or life-threatening deterioration.  Critical care was time spent personally by me on the following activities: development of treatment plan with patient and/or surrogate as well as nursing, discussions with consultants, evaluation of patient's response to treatment, examination of patient, obtaining history from patient or surrogate, ordering and performing treatments and interventions, ordering and review of laboratory studies, ordering and review of radiographic studies, pulse oximetry and re-evaluation of patient's condition.    Length of Stay: 35  Marca Ancona, MD   05/07/2020, 7:42 AM  Advanced Heart Failure Team Pager [phone] (M-F; 7a - 4p)  Please contact [facility] for night-coverage after hours (4p -7a ) and weekends on [email or web address]

## 2020-05-07 NOTE — Procedures (Signed)
Extracorporeal support note  ECLS cannulation date: March 26, 2020 Last circuit change: 05/03/20  Indication: Acute hypoxic respiratory failure due to ARDS from COVID-19 pneumonia with RV dysfunction.   Configuration: Venovenous  Drainage cannula: [address] cannula right internal jugular vein Return cannula: Same  Pump speed: 3400 RPM  Pump flow: Flow (LPM): 4.85  Pump used: Cardio help   Oxygenator: Cardio help O2 blender: 100% Sweep gas: 6L   Circuit check: no clots  Anticoagulant: bivalirudin  Anticoagulation targets: PTT 60-80  Changes in support:  - decreasing volume with diuresis  - decreasing sweep gas   Anticipated goals/duration of support: Bridge to recovery   Rounded with multidisciplinary team.   Alex Igo, DO [facility] 05/07/2020 9:15 AM

## 2020-05-07 NOTE — Progress Notes (Signed)
ANTICOAGULATION CONSULT NOTE  Pharmacy Consult for bivalirudin Indication: ECMO and VTE  Labs: Recent Labs    05/05/20 0407 05/05/20 0408 05/06/20 0353 05/06/20 0357 05/06/20 1613 05/06/20 1620 05/07/20 0427 05/07/20 0624 05/07/20 0854 05/07/20 1512  HGB 8.7*   < > 9.3*   < > 8.7*   < > 8.3* 8.2* 9.2* 8.7*  HCT 27.5*   < > 28.3*   < > 26.7*   < > 24.2* 24.0* 27.0* 26.1*  PLT 230   < > 236  --  251  --  246  --   --  265  APTT 59*   < > 57*  --  66*  --  68*  --   --  66*  LABPROT 17.0*  --  16.7*  --   --   --  17.5*  --   --   --   INR 1.4*  --  1.4*  --   --   --  1.5*  --   --   --   CREATININE 0.70   < > 0.79  --  0.84  --  0.68  --   --  0.94   < > = values in this interval not displayed.    Assessment: 83 yoM admitted with COVID-19 PNA with worsening hypoxia, s/p cannulation for ECMO. Pt was started on IV heparin prior to cannulation due to acute DVTs and possible PE, transitioned to bivalirudin with ECMO. Now s/p tPA on 1/5 and tracheostomy on 1/6. ECMO circuit changed on 05/03/2020  Aptt within goal this evening.  No overt bleeding or complications noted.  Goal of Therapy:  aPTT 60-80 seconds   Plan:  -continue bivalirudin to 0.095 mg/kg/hr (using order-specific wt 72.1kg) -Monitor q12h aPTT/CBC, LDH, and for s/sx of bleeding  Jenetta Downer, [facility] Clinical Pharmacist  05/07/2020 4:47 PM   [facility] pharmacy phone numbers are listed on [email or web address]

## 2020-05-07 NOTE — Progress Notes (Signed)
Hypoglycemic Event  CBG: 64  Treatment: 8 oz juice/soda  Symptoms: None  Follow-up CBG: Time:0455 CBG Result:67  Treatment: 8 oz juice/soda  Follow up CBG: Time: 0516   CBG result: 88  Possible Reasons for Event: Medication regimen: needs to have levemir dose reevaluated due to stoppage of overnight tube feedings  Comments/MD notified:n/a    Manette Doto M Omarius Grantham

## 2020-05-08 ENCOUNTER — Inpatient Hospital Stay (HOSPITAL_COMMUNITY): Payer: Medicaid Other

## 2020-05-08 LAB — TYPE AND SCREEN
ABO/RH(D): B POS
Antibody Screen: NEGATIVE
Unit division: 0
Unit division: 0
Unit division: 0
Unit division: 0

## 2020-05-08 LAB — POCT I-STAT 7, (LYTES, BLD GAS, ICA,H+H)
Acid-Base Excess: 10 mmol/L — ABNORMAL HIGH (ref 0.0–2.0)
Acid-Base Excess: 11 mmol/L — ABNORMAL HIGH (ref 0.0–2.0)
Acid-Base Excess: 11 mmol/L — ABNORMAL HIGH (ref 0.0–2.0)
Acid-Base Excess: 7 mmol/L — ABNORMAL HIGH (ref 0.0–2.0)
Acid-Base Excess: 9 mmol/L — ABNORMAL HIGH (ref 0.0–2.0)
Acid-Base Excess: 9 mmol/L — ABNORMAL HIGH (ref 0.0–2.0)
Acid-Base Excess: 9 mmol/L — ABNORMAL HIGH (ref 0.0–2.0)
Acid-Base Excess: 9 mmol/L — ABNORMAL HIGH (ref 0.0–2.0)
Bicarbonate: 34.8 mmol/L — ABNORMAL HIGH (ref 20.0–28.0)
Bicarbonate: 35.7 mmol/L — ABNORMAL HIGH (ref 20.0–28.0)
Bicarbonate: 35.9 mmol/L — ABNORMAL HIGH (ref 20.0–28.0)
Bicarbonate: 36.1 mmol/L — ABNORMAL HIGH (ref 20.0–28.0)
Bicarbonate: 36.8 mmol/L — ABNORMAL HIGH (ref 20.0–28.0)
Bicarbonate: 36.8 mmol/L — ABNORMAL HIGH (ref 20.0–28.0)
Bicarbonate: 37.5 mmol/L — ABNORMAL HIGH (ref 20.0–28.0)
Bicarbonate: 38 mmol/L — ABNORMAL HIGH (ref 20.0–28.0)
Calcium, Ion: 1.21 mmol/L (ref 1.15–1.40)
Calcium, Ion: 1.21 mmol/L (ref 1.15–1.40)
Calcium, Ion: 1.23 mmol/L (ref 1.15–1.40)
Calcium, Ion: 1.23 mmol/L (ref 1.15–1.40)
Calcium, Ion: 1.25 mmol/L (ref 1.15–1.40)
Calcium, Ion: 1.25 mmol/L (ref 1.15–1.40)
Calcium, Ion: 1.25 mmol/L (ref 1.15–1.40)
Calcium, Ion: 1.26 mmol/L (ref 1.15–1.40)
HCT: 25 % — ABNORMAL LOW (ref 39.0–52.0)
HCT: 25 % — ABNORMAL LOW (ref 39.0–52.0)
HCT: 26 % — ABNORMAL LOW (ref 39.0–52.0)
HCT: 26 % — ABNORMAL LOW (ref 39.0–52.0)
HCT: 26 % — ABNORMAL LOW (ref 39.0–52.0)
HCT: 27 % — ABNORMAL LOW (ref 39.0–52.0)
HCT: 27 % — ABNORMAL LOW (ref 39.0–52.0)
HCT: 28 % — ABNORMAL LOW (ref 39.0–52.0)
Hemoglobin: 8.5 g/dL — ABNORMAL LOW (ref 13.0–17.0)
Hemoglobin: 8.5 g/dL — ABNORMAL LOW (ref 13.0–17.0)
Hemoglobin: 8.8 g/dL — ABNORMAL LOW (ref 13.0–17.0)
Hemoglobin: 8.8 g/dL — ABNORMAL LOW (ref 13.0–17.0)
Hemoglobin: 8.8 g/dL — ABNORMAL LOW (ref 13.0–17.0)
Hemoglobin: 9.2 g/dL — ABNORMAL LOW (ref 13.0–17.0)
Hemoglobin: 9.2 g/dL — ABNORMAL LOW (ref 13.0–17.0)
Hemoglobin: 9.5 g/dL — ABNORMAL LOW (ref 13.0–17.0)
O2 Saturation: 100 %
O2 Saturation: 100 %
O2 Saturation: 97 %
O2 Saturation: 98 %
O2 Saturation: 98 %
O2 Saturation: 99 %
O2 Saturation: 99 %
O2 Saturation: 99 %
Patient temperature: 37
Patient temperature: 37
Patient temperature: 37
Patient temperature: 37
Patient temperature: 37
Patient temperature: 37.2
Patient temperature: 37.6
Patient temperature: 98.4
Potassium: 3.8 mmol/L (ref 3.5–5.1)
Potassium: 3.8 mmol/L (ref 3.5–5.1)
Potassium: 3.8 mmol/L (ref 3.5–5.1)
Potassium: 3.9 mmol/L (ref 3.5–5.1)
Potassium: 3.9 mmol/L (ref 3.5–5.1)
Potassium: 3.9 mmol/L (ref 3.5–5.1)
Potassium: 4.4 mmol/L (ref 3.5–5.1)
Potassium: 4.9 mmol/L (ref 3.5–5.1)
Sodium: 132 mmol/L — ABNORMAL LOW (ref 135–145)
Sodium: 133 mmol/L — ABNORMAL LOW (ref 135–145)
Sodium: 133 mmol/L — ABNORMAL LOW (ref 135–145)
Sodium: 134 mmol/L — ABNORMAL LOW (ref 135–145)
Sodium: 134 mmol/L — ABNORMAL LOW (ref 135–145)
Sodium: 134 mmol/L — ABNORMAL LOW (ref 135–145)
Sodium: 134 mmol/L — ABNORMAL LOW (ref 135–145)
Sodium: 137 mmol/L (ref 135–145)
TCO2: 37 mmol/L — ABNORMAL HIGH (ref 22–32)
TCO2: 37 mmol/L — ABNORMAL HIGH (ref 22–32)
TCO2: 38 mmol/L — ABNORMAL HIGH (ref 22–32)
TCO2: 38 mmol/L — ABNORMAL HIGH (ref 22–32)
TCO2: 39 mmol/L — ABNORMAL HIGH (ref 22–32)
TCO2: 39 mmol/L — ABNORMAL HIGH (ref 22–32)
TCO2: 39 mmol/L — ABNORMAL HIGH (ref 22–32)
TCO2: 40 mmol/L — ABNORMAL HIGH (ref 22–32)
pCO2 arterial: 60.3 mmHg — ABNORMAL HIGH (ref 32.0–48.0)
pCO2 arterial: 60.5 mmHg — ABNORMAL HIGH (ref 32.0–48.0)
pCO2 arterial: 61.9 mmHg — ABNORMAL HIGH (ref 32.0–48.0)
pCO2 arterial: 65.5 mmHg (ref 32.0–48.0)
pCO2 arterial: 67.6 mmHg (ref 32.0–48.0)
pCO2 arterial: 68.8 mmHg (ref 32.0–48.0)
pCO2 arterial: 69.8 mmHg (ref 32.0–48.0)
pCO2 arterial: 69.9 mmHg (ref 32.0–48.0)
pH, Arterial: 7.305 — ABNORMAL LOW (ref 7.350–7.450)
pH, Arterial: 7.33 — ABNORMAL LOW (ref 7.350–7.450)
pH, Arterial: 7.335 — ABNORMAL LOW (ref 7.350–7.450)
pH, Arterial: 7.353 (ref 7.350–7.450)
pH, Arterial: 7.358 (ref 7.350–7.450)
pH, Arterial: 7.372 (ref 7.350–7.450)
pH, Arterial: 7.38 (ref 7.350–7.450)
pH, Arterial: 7.4 (ref 7.350–7.450)
pO2, Arterial: 113 mmHg — ABNORMAL HIGH (ref 83.0–108.0)
pO2, Arterial: 113 mmHg — ABNORMAL HIGH (ref 83.0–108.0)
pO2, Arterial: 162 mmHg — ABNORMAL HIGH (ref 83.0–108.0)
pO2, Arterial: 170 mmHg — ABNORMAL HIGH (ref 83.0–108.0)
pO2, Arterial: 180 mmHg — ABNORMAL HIGH (ref 83.0–108.0)
pO2, Arterial: 193 mmHg — ABNORMAL HIGH (ref 83.0–108.0)
pO2, Arterial: 236 mmHg — ABNORMAL HIGH (ref 83.0–108.0)
pO2, Arterial: 97 mmHg (ref 83.0–108.0)

## 2020-05-08 LAB — BASIC METABOLIC PANEL
Anion gap: 7 (ref 5–15)
Anion gap: 7 (ref 5–15)
BUN: 27 mg/dL — ABNORMAL HIGH (ref 6–20)
BUN: 21 mg/dL — ABNORMAL HIGH (ref 6–20)
CO2: 33 mmol/L — ABNORMAL HIGH (ref 22–32)
CO2: 33 mmol/L — ABNORMAL HIGH (ref 22–32)
Calcium: 8.9 mg/dL (ref 8.9–10.3)
Calcium: 8.8 mg/dL — ABNORMAL LOW (ref 8.9–10.3)
Chloride: 95 mmol/L — ABNORMAL LOW (ref 98–111)
Chloride: 94 mmol/L — ABNORMAL LOW (ref 98–111)
Creatinine, Ser: 0.77 mg/dL (ref 0.61–1.24)
Creatinine, Ser: 0.65 mg/dL (ref 0.61–1.24)
GFR, Estimated: >60 mL/min (ref >60)
GFR, Estimated: >60 mL/min (ref >60)
Glucose, Bld: 116 mg/dL — ABNORMAL HIGH (ref 70–99)
Glucose, Bld: 150 mg/dL — ABNORMAL HIGH (ref 70–99)
Potassium: 4 mmol/L (ref 3.5–5.1)
Potassium: 3.9 mmol/L (ref 3.5–5.1)
Sodium: 135 mmol/L (ref 135–145)
Sodium: 134 mmol/L — ABNORMAL LOW (ref 135–145)

## 2020-05-08 LAB — BPAM RBC
Blood Product Expiration Date: 202203132359
Blood Product Expiration Date: 202203132359
Blood Product Expiration Date: 202203132359
Blood Product Expiration Date: 202203172359
Unit Type and Rh: 7300
Unit Type and Rh: 7300
Unit Type and Rh: 7300
Unit Type and Rh: 7300

## 2020-05-08 LAB — CBC
HCT: 25.9 % — ABNORMAL LOW (ref 39.0–52.0)
HCT: 28.3 % — ABNORMAL LOW (ref 39.0–52.0)
Hemoglobin: 8.3 g/dL — ABNORMAL LOW (ref 13.0–17.0)
Hemoglobin: 9 g/dL — ABNORMAL LOW (ref 13.0–17.0)
MCH: 29.5 pg (ref 26.0–34.0)
MCH: 29.8 pg (ref 26.0–34.0)
MCHC: 32 g/dL (ref 30.0–36.0)
MCHC: 31.8 g/dL (ref 30.0–36.0)
MCV: 92.2 fL (ref 80.0–100.0)
MCV: 93.7 fL (ref 80.0–100.0)
Platelets: 257 10*3/uL (ref 150–400)
Platelets: 263 10*3/uL (ref 150–400)
RBC: 2.81 MIL/uL — ABNORMAL LOW (ref 4.22–5.81)
RBC: 3.02 MIL/uL — ABNORMAL LOW (ref 4.22–5.81)
RDW: 15.3 % (ref 11.5–15.5)
RDW: 15.1 % (ref 11.5–15.5)
WBC: 7.7 10*3/uL (ref 4.0–10.5)
WBC: 11.4 10*3/uL — ABNORMAL HIGH (ref 4.0–10.5)
nRBC: 0 % (ref 0.0–0.2)
nRBC: 0 % (ref 0.0–0.2)

## 2020-05-08 LAB — APTT
aPTT: 65 seconds — ABNORMAL HIGH (ref 24–36)
aPTT: 63 seconds — ABNORMAL HIGH (ref 24–36)

## 2020-05-08 LAB — GLUCOSE, CAPILLARY
Glucose-Capillary: 109 mg/dL — ABNORMAL HIGH (ref 70–99)
Glucose-Capillary: 150 mg/dL — ABNORMAL HIGH (ref 70–99)
Glucose-Capillary: 185 mg/dL — ABNORMAL HIGH (ref 70–99)
Glucose-Capillary: 196 mg/dL — ABNORMAL HIGH (ref 70–99)
Glucose-Capillary: 209 mg/dL — ABNORMAL HIGH (ref 70–99)
Glucose-Capillary: 235 mg/dL — ABNORMAL HIGH (ref 70–99)
Glucose-Capillary: 88 mg/dL (ref 70–99)

## 2020-05-08 LAB — FIBRINOGEN: Fibrinogen: 424 mg/dL (ref 210–475)

## 2020-05-08 LAB — LACTATE DEHYDROGENASE: LDH: 309 U/L — ABNORMAL HIGH (ref 98–192)

## 2020-05-08 LAB — LACTIC ACID, PLASMA
Lactic Acid, Venous: 0.8 mmol/L (ref 0.5–1.9)
Lactic Acid, Venous: 0.7 mmol/L (ref 0.5–1.9)

## 2020-05-08 LAB — PROTIME-INR
INR: 1.5 — ABNORMAL HIGH (ref 0.8–1.2)
Prothrombin Time: 17.6 seconds — ABNORMAL HIGH (ref 11.4–15.2)

## 2020-05-08 MED ORDER — POTASSIUM CHLORIDE 20 MEQ PO PACK
40.0000 meq | PACK | Freq: Once | ORAL | Status: AC
  Administered 2020-05-08: 40 meq
  Filled 2020-05-08: qty 2

## 2020-05-08 MED ORDER — ACETAMINOPHEN 325 MG PO TABS
650.0000 mg | ORAL_TABLET | Freq: Four times a day (QID) | ORAL | Status: DC | PRN
  Administered 2020-05-08: 650 mg via ORAL
  Filled 2020-05-08 (×2): qty 2

## 2020-05-08 MED ORDER — ACETAMINOPHEN 160 MG/5ML PO SOLN: 650.0000 mg | Freq: Four times a day (QID) | ORAL | Status: DC | PRN

## 2020-05-08 NOTE — Progress Notes (Signed)
ANTICOAGULATION CONSULT NOTE  Pharmacy Consult for bivalirudin Indication: ECMO and VTE  Labs: Recent Labs    05/06/20 0353 05/06/20 0357 05/07/20 0427 05/07/20 0624 05/07/20 1512 05/07/20 1828 05/08/20 0048 05/08/20 0343 05/08/20 0636  HGB 9.3*   < > 8.3*   < > 8.7*   < > 8.5* 8.3* 8.5*  HCT 28.3*   < > 24.2*   < > 26.1*   < > 25.0* 25.9* 25.0*  PLT 236   < > 246  --  265  --   --  257  --   APTT 57*   < > 68*  --  66*  --   --  65*  --   LABPROT 16.7*  --  17.5*  --   --   --   --  17.6*  --   INR 1.4*  --  1.5*  --   --   --   --  1.5*  --   CREATININE 0.79   < > 0.68  --  0.94  --   --  0.77  --    < > = values in this interval not displayed.    Assessment: Alex [name] admitted with COVID-19 PNA with worsening hypoxia, s/p cannulation for ECMO. Pt was started on IV heparin prior to cannulation due to acute DVTs and possible PE, transitioned to bivalirudin with ECMO. S/p tPA on 1/5 and tracheostomy on 1/6. ECMO circuit changed on 05/03/2020  Aptt 65sec within goal on bivalirudin 0.095mg /kg/hr.  No overt bleeding or complications noted.  Goal of Therapy:  aPTT Alex-80 seconds   Plan:  -continue bivalirudin to 0.095 mg/kg/hr (using order-specific wt 72.1kg) -Monitor q12h aPTT/CBC, LDH, and for s/sx of bleeding    [name] Pharm.D. CPP, BCPS Clinical Pharmacist [phone] 05/08/2020 7:59 AM   [facility] pharmacy phone numbers are listed on [email or web address]

## 2020-05-08 NOTE — Progress Notes (Signed)
ANTICOAGULATION CONSULT NOTE  Pharmacy Consult for bivalirudin Indication: ECMO and VTE  Labs: Recent Labs    05/06/20 0353 05/06/20 0357 05/07/20 0427 05/07/20 0624 05/07/20 1512 05/07/20 1828 05/08/20 0343 05/08/20 0636 05/08/20 1624 05/08/20 1630 05/08/20 1743  HGB 9.3*   < > 8.3*   < > 8.7*   < > 8.3*   < > 9.0* 8.8* 9.5*  HCT 28.3*   < > 24.2*   < > 26.1*   < > 25.9*   < > 28.3* 26.0* 28.0*  PLT 236   < > 246  --  265  --  257  --  263  --   --   APTT 57*   < > 68*  --  66*  --  65*  --  63*  --   --   LABPROT 16.7*  --  17.5*  --   --   --  17.6*  --   --   --   --   INR 1.4*  --  1.5*  --   --   --  1.5*  --   --   --   --   CREATININE 0.79   < > 0.68  --  0.94  --  0.77  --  0.65  --   --    < > = values in this interval not displayed.    Assessment: 18 yoM admitted with COVID-19 PNA with worsening hypoxia, s/p cannulation for ECMO. Pt was started on IV heparin prior to cannulation due to acute DVTs and possible PE, transitioned to bivalirudin with ECMO. S/p tPA on 1/5 and tracheostomy on 1/6. ECMO circuit changed on 05/03/2020  Aptt 63sec this evening  within goal on bivalirudin 0.095mg /kg/hr.  No overt bleeding or complications noted. CBC stable LDH stable 300s  Goal of Therapy:  aPTT 60-80 seconds   Plan:  -continue bivalirudin to 0.095 mg/kg/hr (using order-specific wt 72.1kg) -Monitor q12h aPTT/CBC, LDH, and for s/sx of bleeding    [name] Pharm.D. CPP, BCPS Clinical Pharmacist [phone] 05/08/2020 6:15 PM   [facility] pharmacy phone numbers are listed on [email or web address]

## 2020-05-08 NOTE — Progress Notes (Signed)
Patient ID: Alex [name], male   DOB: [birth year], 50 y.o.   MRN: [id number]     Advanced Heart Failure Rounding Note  PCP-Cardiologist: No primary care provider on file.   Subjective:    - 12/30: VV ECMO cannulation - 12/31: Left chest tube replaced - 1/2: Extubated. Echo with EF 60-65%, mildly dilated RV with mildly decreased systolic function.  - 1/4: Agitated, suspected aspiration.  Re-intubated.  - 1/5: ECMO cannula repositioned under TEE guidance. TEE showed moderately dilated/moderate-severely dysfunctional RV in setting of hypoxemia. LUE DVT found.  Patient got 1/2 dose of TPA due to initial concern for large PE.  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  - 1/6: Tracheostomy - 1/7: Echo with mild RV dilation/mild RV dysfunction.  - 1/16: Left chest tube out - 1/17: LUE arterial dopplers repeated, showed no obstruction.  - 1/20: Echo with EF 65-70%, mildly D-shaped septum, mildly dilated and mildly dysfunctional RV.  - 1/22: CTA chest: Bilateral upper lobe PEs (suspect chronic), changes c/w ARDS - 1/23: Bronchoscopy showed semi-occlusive ?mass/polyp in the trachea.  - 1/24: Bronchoscopy showed resolution of 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 - 1/26: ECMO circuit changed.  - 2/1: CT chest showed diffuse bronchiectasis as well as diffuse opacity consistent with COVID-19 PNA with ARDS. - 2/3: Trach exchange - 2/5: Decannulated to HFNC - 2/6: Enterococcal PNA/bacteremia.  Echo showed EF 65-70%, normal-appearing RV, no vegetation noted.  - 2/7: TEE with no vegetation, EF 55-60%, RV low normal function with normal size.  - 2/16: Right thoracentesis 500 cc - 2/22: ECMO circuit changed.  ECMO cannula repositioned under TEE guidance. TEE with EF 60-65%, normal RV.   Remains on 2 L HFNC this morning.  Sitting in chair eating breakfast.  Walking unit with PT. CXR with stable severe bilateral infiltrates. Sweep down to 6 -> 3.5 today, PaCO2 still at 60  Remains on IV lasix 40 q8 and  diamox. Weight down 2 pounds (still up about 15 pounds)  ECMO parameters: 3500 rpm Flow 5.1 L/min Pvenous -101 Delta P 24 Sweep 3.5 HFNC 2 L  ABG 7.40/61/236/100% LDH 329 => 301 => 309 PTT 65 Lactate 0.8 Hgb 8.3  Objective:   Weight Range: 77 kg Body mass index is 29.14 kg/m.   Vital Signs:   Temp:  [98.4 F (36.9 C)-98.6 F (37 C)] 98.6 F (37 C) (02/26 0400) Pulse Rate:  [90-120] 110 (02/26 0800) Resp:  [16-33] 21 (02/26 0800) BP: (121-174)/(59-96) 142/59 (02/25 1945) SpO2:  [92 %-100 %] 98 % (02/26 0815) Arterial Line BP: (95-174)/(47-96) 149/59 (02/26 0800) Weight:  [77 kg] 77 kg (02/26 0700) Last BM Date: 05/07/20  Weight change: Filed Weights   05/06/20 0600 05/07/20 0700 05/08/20 0700  Weight: 79.4 kg 77.8 kg 77 kg    Intake/Output:   Intake/Output Summary (Last 24 hours) at 05/08/2020 0815 Last data filed at 05/08/2020 0800 Gross per 24 hour  Intake 1468.11 ml  Output 3800 ml  Net -2331.89 ml      Physical Exam    General:  Well appearing. No resp difficulty HEENT: normal Neck: supple. RIJ ecmo cannula Carotids 2+ bilat; no bruits. No lymphadenopathy or thryomegaly appreciated. Cor: PMI nondisplaced. Regular rate & rhythm. No rubs, gallops or murmurs. Lungs: decreased throughout Abdomen: soft, nontender, nondistended. No hepatosplenomegaly. No bruits or masses. Good bowel sounds. Extremities: no cyanosis, clubbing, rash, 1+ edema Neuro: alert & orientedx3, cranial nerves grossly intact. moves all 4 extremities w/o difficulty. Affect pleasant   Telemetry  NSR 110s. Personally reviewed  Labs    CBC Recent Labs    05/07/20 1512 05/07/20 1828 05/08/20 0343 05/08/20 0636  WBC 9.2  --  7.7  --   HGB 8.7*   < > 8.3* 8.5*  HCT 26.1*   < > 25.9* 25.0*  MCV 93.2  --  92.2  --   PLT 265  --  257  --    < > = values in this interval not displayed.   Basic Metabolic Panel Recent Labs    87/56/43 1512 05/07/20 1828 05/08/20 0343  05/08/20 0636  NA 133*   < > 135 137  K 4.0   < > 4.0 3.9  CL 93*  --  95*  --   CO2 31  --  33*  --   GLUCOSE 189*  --  116*  --   BUN 24*  --  27*  --   CREATININE 0.94  --  0.77  --   CALCIUM 8.6*  --  8.9  --    < > = values in this interval not displayed.   Liver Function Tests Recent Labs    05/06/20 0353  AST 25  ALT 51*  ALKPHOS 161*  BILITOT 0.8  PROT 5.6*  ALBUMIN 2.9*   No results for input(s): LIPASE, AMYLASE in the last 72 hours. Cardiac Enzymes No results for input(s): CKTOTAL, CKMB, CKMBINDEX, TROPONINI in the last 72 hours.  BNP: BNP (last 3 results) No results for input(s): BNP in the last 8760 hours.  ProBNP (last 3 results) No results for input(s): PROBNP in the last 8760 hours.   D-Dimer No results for input(s): DDIMER in the last 72 hours. Hemoglobin A1C No results for input(s): HGBA1C in the last 72 hours. Fasting Lipid Panel No results for input(s): CHOL, HDL, LDLCALC, TRIG, CHOLHDL, LDLDIRECT in the last 72 hours. Thyroid Function Tests No results for input(s): TSH, T4TOTAL, T3FREE, THYROIDAB in the last 72 hours.  Invalid input(s): FREET3  Other results:   Imaging    DG CHEST PORT 1 VIEW  Result Date: 05/08/2020 CLINICAL DATA:  50 year old male on ECMO. EXAM: PORTABLE CHEST 1 VIEW COMPARISON:  Chest x-ray 05/07/2020. FINDINGS: ECMO catheter appears similarly positioned to prior examination. There is a left upper extremity PICC with tip terminating in the mid superior vena cava. Near complete opacification of the lungs bilaterally with widespread air bronchograms, similar to recent prior examinations. Pulmonary vasculature and cardiomediastinal silhouette are completely obscured. IMPRESSION: 1. Support apparatus, as above. 2. Stable appearance of the lungs, without significant change in aeration compared to prior examinations, as above. Electronically Signed   By: Trudie Reed M.D.   On: 05/08/2020 08:12     Medications:      Scheduled Medications: . (feeding supplement) PROSource Plus  30 mL Oral TID BM  . sodium chloride   Intravenous Once  . acetaZOLAMIDE  250 mg Oral BID  . bethanechol  10 mg Oral TID  . carvedilol  25 mg Oral BID WC  . chlorhexidine  15 mL Mouth Rinse BID  . Chlorhexidine Gluconate Cloth  6 each Topical Daily  . chlorpheniramine-HYDROcodone  5 mL Oral Q12H  . clonazePAM  0.5 mg Oral Daily  . clonazePAM  1 mg Oral QHS  . dextromethorphan  30 mg Oral TID  . diltiazem  30 mg Oral Daily  . docusate sodium  100 mg Oral BID  . doxazosin  4 mg Oral Daily  . feeding supplement  237 mL Oral TID WC & HS  . fiber  1 packet Oral BID  . furosemide  40 mg Intravenous Q8H  . gabapentin  300 mg Oral BID  . insulin aspart  0-20 Units Subcutaneous Q4H  . insulin detemir  20 Units Subcutaneous QHS  . insulin detemir  25 Units Subcutaneous Daily  . ipratropium-albuterol  3 mL Nebulization BID  . mouth rinse  15 mL Mouth Rinse q12n4p  . melatonin  5 mg Oral QHS  . metoCLOPramide (REGLAN) injection  10 mg Intravenous Q8H  . multivitamin with minerals  1 tablet Oral Daily  . pantoprazole sodium  40 mg Oral QHS  . QUEtiapine  50 mg Oral QHS  . senna  1 tablet Oral QHS  . sertraline  25 mg Oral Daily  . sodium chloride flush  10-40 mL Intracatheter Q12H    Infusions: . sodium chloride    . sodium chloride Stopped (04/29/20 1611)  . albumin human Stopped (04/23/20 0041)  . bivalirudin (ANGIOMAX) infusion 0.5 mg/mL (Non-ACS indications) 0.095 mg/kg/hr (05/08/20 0800)    PRN Medications: Place/Maintain arterial line **AND** sodium chloride, acetaminophen (TYLENOL) oral liquid 160 mg/5 mL, albumin human, [DISCONTINUED] lidocaine **AND** albuterol, clonazePAM, guaiFENesin, hydrALAZINE, labetalol, lip balm, ondansetron (ZOFRAN) IV, oxyCODONE, phenol, polyethylene glycol, promethazine-codeine, simethicone, sodium chloride, sodium chloride flush   Assessment/Plan   1. Acute hypoxemic respiratory  failure: Due to COVID-19 PNA with bilateral infiltrates.  Refractory hypoxemia, VV-ECMO cannulation on 03/10/2020 with improvement in oxygenation.  Developed left PTX post-subclavian CVL and had left chest tube, the left lung is re-expanded and CT out.  He was extubated 1/2 but reintubated 1/4 with agitation and suspected aspiration.  Tracheostomy 1/6.  CTA chest 1/22 with suspected chronic PEs and ARDS. ECMO cannula repositioned 1/5. ECMO circuit changed 1/26. Extubated to HFNC on 2/5. ECMO circuit changed and cannula repositioned 2/22.  LDH stable. Has struggled with hypercarbia from significant dead space ventilation. Sweep down to 3.5. PCO2 up to 60. Now off abx. Weight coming down with increased Lasix but still and diamox. Still about 15 pounds up.  - Continue attempts at weaning sweep. Watch pCO2. CXR remains very concerning for poor chances at recovery. - Continue acetazolamide 250 mg bid and Lasix 40 mg IV every 8 hrs.   - Patient has had remdesivir, tocilizumab. - Completed steroid taper. - Continue bivalirudin, goal PTT 65-80.  PTT 68. Discussed dosing with PharmD personally. - Continue to mobilize. - Coreg + low dose diltiazem continues at 25 mg bid to increase fractional flow via ECMO circuit.  - Due to lack of insurance, lung transplant currently is not an option. SW working on options. Will see if we can check PRA level to further evaluate transplant candidacy.  2. 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  Clot noted on TEE in RV as well.  TTE 1/2 showed normal EF 60-65%, RV improved (mildly dilated/dysfunctional). TEE on 1/5 with moderate to severe RV dysfunction but patient was hypoxemic.  Had 1/2 dose TPA on 1/5. Echo 1/20 with mildly dilated/mildly dysfunctional RV. CTA chest 1/22 with chronic-appearing PEs in upper lobes. - PTT 685. Bivalirudin for goal PTT 65-80. Discussed dosing with PharmD personally. 3. Left PTX: Left  chest tube, lung is re-expanded. Tube now out, stable CXR. 4. Shock: Suspect septic/distributive. Resolved 5. Anemia: Hgb 8.3 transfuse < 7.5.  6. AKI: Resolved 7. Hyperglycemia: insulin.  8. HTN: Controlled.     - Continue Coreg  25 mg bid.   - Diltiazem 30 bid.  9. CHB: Episode of CHB when hypoxemic and with cough (suspect vagal).  NSR since then.   He has occasional short vagal episodes.  - Continue Coreg/diltiazem, watch rhythm.  10. Thrombocytopenia: Resolved  11. FEN: He is eating well, tube feeds off and Cortrack out.  12. Ischemic digits: LUE.  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  Repeat study 1/17 showed no obstruction.  - Wound Care following. 13. ID: Group F Strep and Enterococcus faecalis in sputum. Initially completed abx for these bacteria. Now with recurrent sepsis, Enterococcus faecalis in blood now. Vancomycin restarted and continued to 2/21. TEE 2/7 with no vegetation. Currently no abx.  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   - resolved 15. Hypernatremia: Resolved. Na 135  Continue free water. No change 16. Depression: On sertraline 25 mg daily.   CRITICAL CARE Performed by: Arvilla Meresaniel Cypress Fanfan  Total critical care time: 35 minutes  Critical care time was exclusive of separately billable procedures and treating other patients.  Critical care was necessary to treat or prevent imminent or life-threatening deterioration.  Critical care was time spent personally by me on the following activities: development of treatment plan with patient and/or surrogate as well as nursing, discussions with consultants, evaluation of patient's response to treatment, examination of patient, obtaining history from patient or surrogate, ordering and performing treatments and interventions, ordering and review of laboratory studies, ordering and review of radiographic  studies, pulse oximetry and re-evaluation of patient's condition.    Length of Stay: 6460  Arvilla Meresaniel Yicel Shannon, MD  05/08/2020, 8:15 AM  Advanced Heart Failure Team Pager [phone] (M-F; 7a - 4p)  Please contact [facility] for night-coverage after hours (4p -7a ) and weekends on [email or web address]

## 2020-05-08 NOTE — Procedures (Signed)
Extracorporeal support note  ECLS cannulation date: 02/29/2020 Last circuit change: 05/03/20  Indication: Acute hypoxemic respiratory failure secondary to COVID-19 pneumonia  Configuration: Venovenous ECMO  Drainage cannula: 30 [place]. cannula within the right internal jugular Return cannula: Same  Pump speed: 3400 RPM Pump flow: Flow (LPM): 5.1  Pump used: Cardio health  Oxygenator: Cardio help O2 blender: 100% Sweep gas: 4 L  Circuit check: No clots Anticoagulant: Bivalirudin Anticoagulation targets: PTT goal 60-80  Changes in support:  - decreasing volume with diuresis  - decreasing sweep gas   Anticipated goals/duration of support: Bridge to recovery  Rounding completed this morning with multidisciplinary team.  Josephine Igo, DO [facility] 05/08/2020 9:51 AM

## 2020-05-08 NOTE — Progress Notes (Signed)
Critical Care Progress Note  NAME:  Alex [name], MRN:  [id number], DOB:  10/13/1970, LOS: 60 ADMISSION DATE:  03/05/2020, CONSULTATION DATE: 03/31/20 REFERRING MD: Wynona Neat -LBPCCM, CHIEF COMPLAINT: Respiratory failure requiring ECMO  HPI/course in hospital  50 year old man admitted to hospital 12/28 with 1 week history of dyspnea cough nausea and vomiting.  Initially admitted to [facility] and placed on high flow nasal cannula but rapidly failed and required intubation 12/29.  Persistent hypoxic respiratory failure with PF ratio 55 in spite of 18 of PEEP FiO2 0.1 despite paralytics.  Did not improve with prone ventilation  Cannulated for VV ECMO 12/30 via right IJ crescent cannula.  ECMO circuit was changed on 04/07/2020 Iatrogenic pneumothorax from left subclavian triple-lumen placement  12/28 admitted covid, ARDS 12/30 ECMO cannulation, iatrogenic pneumothrorax, DVT, Bivalrudin started           Left subclavian hematoma, chest tube, ceftriaxone/ azithromycin started 12/31 1Unit PRBC 1/1 Palliative consult, noted HTN, fentanyl only 1/2 Extubation I/O positive, left lung re-expanded, EF 60-65%, Lasix 20mg  BID 1/3 BiPAP in place, HTN to 190s, 200s, labetolol for BP, I/O even, cefepime and vancomycin started 1/4 agitation off BiPAP, possible aspiration 1/5 agitation overnight, reintubated      ECHO with RV dilation, ECMO cannula repositioned, LUE DVT (+), 1/2 dose tPA given,      I/O up, on lasix, Epi, NE, vasopressin started, HR/ rhythm issues noted 1/6 Tracheostomy, hypoglycemic when off tube feeds       Cortrack tune issues 1/7 on lasix gtt 4mg /hr with diuresis, agitation improved 1/8 starting steroid taper 1/9 severe agitation, heavy sedation required, lasix gtt 6mg /hr, I/Os (-)       precedex and fentanyl 1/10 Vanc stopped 1/11 sweep to 2, lasix gtt at 4mg /hr, weight up, metoprolol added for HTN 1/12 fevers to 99 noted, lasix gtt and metazolone 1/13 lasix gtt  dced 1/14 intermittent HTN, possibly flash pulmonary edema tied to sedation        Ischemic left hand changes, 1 Unit PRBCs 1/15 1/16 sweep from 4 to 2.5, chest tube removed 1/17 sweep at 6, trial of nebulized morphine for cough, LUE dopplers show no obstruction 1/18 two events of 2nd degree type II HB noted with coughing         sweep at 4, IV lasix 40, acetazolamide, distal XLT trach placement         agitation and BP spikes 1/19 agitation, air hunger, seroquel, klonopin, oxy, valproate, ketamine trial 1/20 sweep at 3.5, diuresis results in chugging, albumin given         1 Unit PRBCs, lidocaine nebulizers for cough, ancef started 1/21 sweep to 8, anxiety, I/Os (+) with lasix and acetazolamide 1/22 sweep at 5, chest CT bilateral upper lobe PEs 1/23 sweep at 5, delirium, I/Os even with lasix and acetazolamide         continued coughing, 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 1/24 sweep at 7.5, 1 Unit PRBCs, repeat bronchoscopy showed resolution of 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 1/25 sweep at 7.5, on propofol, lasix gtt at 32ml/hr, I/Os (+)         nebulized morphine and lidocaine for cough 1/26 sweep at 8, propofol/ precedex, cough present when weaned, lasix gtt at 64ml/hr        ECMO circuit changes, levophed started, 1 Unit PRBCs 1/27 sweep at 4, off sedation, delirium, I/Os even        lasix gtt restated, acetazolamide overnight 1/28 sweep at 7.5,  HTN labile 1/29 sweep of 6, patient reports a tickle in throat, cetacaine 1/30 sweep down to 6, cough improved with cetacaine and gabapentin 1/31 sweep at 5, agitation, off acetazolamide, lasix, weight up 2/1 cough, of pulmicort, cetacaine for cough, continued gabapentin, dexmethorphan       CT demonstrated trach placement against back of trachea 2/2 Trach collar trial somewhat effective in managing cough       low dose diltiazem for HR,  2/3 sweep at 4,trach cannula replacement (longer, more flexible bivona); brochoscopy  shows irritated mucosa       IV lasix and acetazolamide 2/4 sweep at 5, I/Os (+), BUN elevated, lasix gtt with diamox,       trach decannulation, increased gabapentin for cough 2/5 start HCAP coverage for rising WBC/temps, check Pct/cultures, CXR worse with lack of PEEP, diuresed well but started getting very hypotensive, sepsis vs. Too dry 04/18/20 blood cx grew E faecalis 2/7 PICC change, TEE 2/7>> sweep weans 2/22 Cannula adjustment, circuit change 2/25 up walking in the hallway with physical therapy  Past Medical History  none  Interim history/subjective:   Patient up in chair again this morning.  Still on VV ECMO  Objective   Blood pressure (!) 142/59, pulse (!) 110, temperature 98.6 F (37 C), temperature source Core, resp. rate (!) 21, height 5\' 4"  (1.626 m), weight 77 kg, SpO2 96 %.        Intake/Output Summary (Last 24 hours) at 05/08/2020 0803 Last data filed at 05/08/2020 0800 Gross per 24 hour  Intake 1468.11 ml  Output 3800 ml  Net -2331.89 ml   Filed Weights   05/06/20 0600 05/07/20 0700 05/08/20 0700  Weight: 79.4 kg 77.8 kg 77 kg    Examination: Constitutional: Sitting up in chair Eyes: Tracking appropriately Ears, mucous membranes moist, prior trach site clear Cardiovascular: Regular rate rhythm S1-S2 Respiratory: Bilateral breath sounds Gastrointestinal: Soft nontender nondistended Skin: No rash Neurologic: Moves all 4 extremities Psychiatric: RASS 0 Ext: Dependent upper and lower extremity trace edema   Labs reviewed  Chest x-ray 05/08/2020: Bilateral airspace disease relatively unchanged The patient's images have been independently reviewed by me.    Assessment & Plan:   Acute hypoxemic/hypercapnic respiratory failure due to severe ARDS from COVID-19 pneumonia Probable acute PE, and RV dysfunction s/p TPA Refractory coughing- improved after trach removal but still an issue intermittently Strep group F/ enterococcal pneumonia- s/p 10 days vanc  1/29; recurrent with bacteremia on 2/5 now s/p vanc x 14 days R effusion- lymphocyte predominant transudate drained 2/16 Plan: Continue full VV ECMO support Continue weaning sleep gas as tolerated Currently on 4 L sweep gas plans to go down to 2 L and observe. This seems to be the location which we have run into trouble in the past per ECMO multidisciplinary team. Continue I-S, flutter, mobility Diuresis to maintain euvolemia  HTN.  Trials of reducing HR/BP have resulted in reduced pCO2 (likely related to fractional flow through ECMO circuit) Plan: Continue Coreg  Hyponatremia- I Continue to observe with diuresis  Acute delirium, with agitation- improved. Plan: Pain control with oxycodone, gabapentin Continue Klonopin would like to decrease this  Sepsis secondary to E faecalis bacteremia- secondary to PNA, has grown enterococcus in sputum prior, previous vanc course completed 1/29. TEE on 2/7 without vegetations. PICC changed on 2/7.  14 day vanc therapy from PICC change completed 2/21. Plan: Continue to observe for any sign of fever  Gastroparesis with some element ileus: improved but occasional vomiting spells  with coughing attacks.  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  - wound care following, appreciate dressing recommendations Plan: Keep arterial line out of the left arm  R arm swelling- duplex neg, NTD here, going to space out cuff measurements since we have arterial line  Hyperglycemia due to diabetes type II -Plan: Goal CBG 140-180  Acute urinary retention- recurrent issue Plan: Condom cath Continue Cardura Continue intermittent bladder scans as needed  Deconditioning -Continue PT OT SLP -Continue nursing daily exercise in bed  Destination of therapy-  Plan: VV ECMO with bridge to recovery  Daily Goals Checklist  Pain/Anxiety/Delirium protocol  (if indicated): see above VAP protocol (if indicated):  n/a DVT prophylaxis: bivalirudin GI prophylaxis: pantoprazole Glucose control: basal bolus insulin Mobility/therapy needs: Mobilization as tolerated Code Status: Full code Disposition: ICU  This patient is critically ill with multiple organ system failure; which, requires frequent high complexity decision making, assessment, support, evaluation, and titration of therapies. This was completed through the application of advanced monitoring technologies and extensive interpretation of multiple databases. During this encounter critical care time was devoted to patient care services described in this note for 32 minutes.  Josephine Igo, DO [place] [facility] 05/08/2020 8:03 AM

## 2020-05-09 ENCOUNTER — Inpatient Hospital Stay (HOSPITAL_COMMUNITY): Payer: Medicaid Other

## 2020-05-09 DIAGNOSIS — T790XXA Air embolism (traumatic), initial encounter: Secondary | ICD-10-CM

## 2020-05-09 DIAGNOSIS — J9601 Acute respiratory failure with hypoxia: Secondary | ICD-10-CM

## 2020-05-09 LAB — BASIC METABOLIC PANEL
Anion gap: 10 (ref 5–15)
Anion gap: 11 (ref 5–15)
BUN: 21 mg/dL — ABNORMAL HIGH (ref 6–20)
BUN: 24 mg/dL — ABNORMAL HIGH (ref 6–20)
CO2: 30 mmol/L (ref 22–32)
CO2: 27 mmol/L (ref 22–32)
Calcium: 8.5 mg/dL — ABNORMAL LOW (ref 8.9–10.3)
Calcium: 8.5 mg/dL — ABNORMAL LOW (ref 8.9–10.3)
Chloride: 94 mmol/L — ABNORMAL LOW (ref 98–111)
Chloride: 95 mmol/L — ABNORMAL LOW (ref 98–111)
Creatinine, Ser: 0.95 mg/dL (ref 0.61–1.24)
Creatinine, Ser: 0.77 mg/dL (ref 0.61–1.24)
GFR, Estimated: >60 mL/min (ref >60)
GFR, Estimated: >60 mL/min (ref >60)
Glucose, Bld: 138 mg/dL — ABNORMAL HIGH (ref 70–99)
Glucose, Bld: 70 mg/dL (ref 70–99)
Potassium: 3.8 mmol/L (ref 3.5–5.1)
Potassium: 4.3 mmol/L (ref 3.5–5.1)
Sodium: 134 mmol/L — ABNORMAL LOW (ref 135–145)
Sodium: 133 mmol/L — ABNORMAL LOW (ref 135–145)

## 2020-05-09 LAB — POCT I-STAT 7, (LYTES, BLD GAS, ICA,H+H)
Acid-Base Excess: 11 mmol/L — ABNORMAL HIGH (ref 0.0–2.0)
Acid-Base Excess: 4 mmol/L — ABNORMAL HIGH (ref 0.0–2.0)
Acid-Base Excess: 6 mmol/L — ABNORMAL HIGH (ref 0.0–2.0)
Acid-Base Excess: 6 mmol/L — ABNORMAL HIGH (ref 0.0–2.0)
Acid-Base Excess: 7 mmol/L — ABNORMAL HIGH (ref 0.0–2.0)
Acid-Base Excess: 8 mmol/L — ABNORMAL HIGH (ref 0.0–2.0)
Acid-Base Excess: 9 mmol/L — ABNORMAL HIGH (ref 0.0–2.0)
Bicarbonate: 30.6 mmol/L — ABNORMAL HIGH (ref 20.0–28.0)
Bicarbonate: 31.5 mmol/L — ABNORMAL HIGH (ref 20.0–28.0)
Bicarbonate: 31.6 mmol/L — ABNORMAL HIGH (ref 20.0–28.0)
Bicarbonate: 32.2 mmol/L — ABNORMAL HIGH (ref 20.0–28.0)
Bicarbonate: 33 mmol/L — ABNORMAL HIGH (ref 20.0–28.0)
Bicarbonate: 34.5 mmol/L — ABNORMAL HIGH (ref 20.0–28.0)
Bicarbonate: 34.9 mmol/L — ABNORMAL HIGH (ref 20.0–28.0)
Calcium, Ion: 1.12 mmol/L — ABNORMAL LOW (ref 1.15–1.40)
Calcium, Ion: 1.14 mmol/L — ABNORMAL LOW (ref 1.15–1.40)
Calcium, Ion: 1.17 mmol/L (ref 1.15–1.40)
Calcium, Ion: 1.17 mmol/L (ref 1.15–1.40)
Calcium, Ion: 1.17 mmol/L (ref 1.15–1.40)
Calcium, Ion: 1.18 mmol/L (ref 1.15–1.40)
Calcium, Ion: 1.19 mmol/L (ref 1.15–1.40)
HCT: 23 % — ABNORMAL LOW (ref 39.0–52.0)
HCT: 26 % — ABNORMAL LOW (ref 39.0–52.0)
HCT: 26 % — ABNORMAL LOW (ref 39.0–52.0)
HCT: 26 % — ABNORMAL LOW (ref 39.0–52.0)
HCT: 26 % — ABNORMAL LOW (ref 39.0–52.0)
HCT: 27 % — ABNORMAL LOW (ref 39.0–52.0)
HCT: 28 % — ABNORMAL LOW (ref 39.0–52.0)
Hemoglobin: 7.8 g/dL — ABNORMAL LOW (ref 13.0–17.0)
Hemoglobin: 8.8 g/dL — ABNORMAL LOW (ref 13.0–17.0)
Hemoglobin: 8.8 g/dL — ABNORMAL LOW (ref 13.0–17.0)
Hemoglobin: 8.8 g/dL — ABNORMAL LOW (ref 13.0–17.0)
Hemoglobin: 8.8 g/dL — ABNORMAL LOW (ref 13.0–17.0)
Hemoglobin: 9.2 g/dL — ABNORMAL LOW (ref 13.0–17.0)
Hemoglobin: 9.5 g/dL — ABNORMAL LOW (ref 13.0–17.0)
O2 Saturation: 100 %
O2 Saturation: 100 %
O2 Saturation: 100 %
O2 Saturation: 100 %
O2 Saturation: 100 %
O2 Saturation: 99 %
O2 Saturation: 99 %
Patient temperature: 101.8
Patient temperature: 36.8
Patient temperature: 37
Patient temperature: 37.2
Patient temperature: 37.3
Patient temperature: 37.4
Patient temperature: 37.7
Potassium: 3.3 mmol/L — ABNORMAL LOW (ref 3.5–5.1)
Potassium: 3.7 mmol/L (ref 3.5–5.1)
Potassium: 3.9 mmol/L (ref 3.5–5.1)
Potassium: 4.2 mmol/L (ref 3.5–5.1)
Potassium: 4.3 mmol/L (ref 3.5–5.1)
Potassium: 4.5 mmol/L (ref 3.5–5.1)
Potassium: 4.7 mmol/L (ref 3.5–5.1)
Sodium: 132 mmol/L — ABNORMAL LOW (ref 135–145)
Sodium: 133 mmol/L — ABNORMAL LOW (ref 135–145)
Sodium: 133 mmol/L — ABNORMAL LOW (ref 135–145)
Sodium: 133 mmol/L — ABNORMAL LOW (ref 135–145)
Sodium: 134 mmol/L — ABNORMAL LOW (ref 135–145)
Sodium: 134 mmol/L — ABNORMAL LOW (ref 135–145)
Sodium: 135 mmol/L (ref 135–145)
TCO2: 32 mmol/L (ref 22–32)
TCO2: 33 mmol/L — ABNORMAL HIGH (ref 22–32)
TCO2: 33 mmol/L — ABNORMAL HIGH (ref 22–32)
TCO2: 34 mmol/L — ABNORMAL HIGH (ref 22–32)
TCO2: 35 mmol/L — ABNORMAL HIGH (ref 22–32)
TCO2: 36 mmol/L — ABNORMAL HIGH (ref 22–32)
TCO2: 36 mmol/L — ABNORMAL HIGH (ref 22–32)
pCO2 arterial: 40.7 mmHg (ref 32.0–48.0)
pCO2 arterial: 45 mmHg (ref 32.0–48.0)
pCO2 arterial: 47.4 mmHg (ref 32.0–48.0)
pCO2 arterial: 54.5 mmHg — ABNORMAL HIGH (ref 32.0–48.0)
pCO2 arterial: 55.4 mmHg — ABNORMAL HIGH (ref 32.0–48.0)
pCO2 arterial: 56.7 mmHg — ABNORMAL HIGH (ref 32.0–48.0)
pCO2 arterial: 56.8 mmHg — ABNORMAL HIGH (ref 32.0–48.0)
pH, Arterial: 7.341 — ABNORMAL LOW (ref 7.350–7.450)
pH, Arterial: 7.361 (ref 7.350–7.450)
pH, Arterial: 7.393 (ref 7.350–7.450)
pH, Arterial: 7.41 (ref 7.350–7.450)
pH, Arterial: 7.43 (ref 7.350–7.450)
pH, Arterial: 7.498 — ABNORMAL HIGH (ref 7.350–7.450)
pH, Arterial: 7.5 — ABNORMAL HIGH (ref 7.350–7.450)
pO2, Arterial: 134 mmHg — ABNORMAL HIGH (ref 83.0–108.0)
pO2, Arterial: 162 mmHg — ABNORMAL HIGH (ref 83.0–108.0)
pO2, Arterial: 238 mmHg — ABNORMAL HIGH (ref 83.0–108.0)
pO2, Arterial: 247 mmHg — ABNORMAL HIGH (ref 83.0–108.0)
pO2, Arterial: 248 mmHg — ABNORMAL HIGH (ref 83.0–108.0)
pO2, Arterial: 298 mmHg — ABNORMAL HIGH (ref 83.0–108.0)
pO2, Arterial: 400 mmHg — ABNORMAL HIGH (ref 83.0–108.0)

## 2020-05-09 LAB — CBC
HCT: 25.4 % — ABNORMAL LOW (ref 39.0–52.0)
HCT: 27.6 % — ABNORMAL LOW (ref 39.0–52.0)
Hemoglobin: 8.7 g/dL — ABNORMAL LOW (ref 13.0–17.0)
Hemoglobin: 9.2 g/dL — ABNORMAL LOW (ref 13.0–17.0)
MCH: 31.1 pg (ref 26.0–34.0)
MCH: 29.9 pg (ref 26.0–34.0)
MCHC: 34.3 g/dL (ref 30.0–36.0)
MCHC: 33.3 g/dL (ref 30.0–36.0)
MCV: 90.7 fL (ref 80.0–100.0)
MCV: 89.6 fL (ref 80.0–100.0)
Platelets: 238 10*3/uL (ref 150–400)
Platelets: 191 10*3/uL (ref 150–400)
RBC: 2.8 MIL/uL — ABNORMAL LOW (ref 4.22–5.81)
RBC: 3.08 MIL/uL — ABNORMAL LOW (ref 4.22–5.81)
RDW: 15.1 % (ref 11.5–15.5)
RDW: 15.5 % (ref 11.5–15.5)
WBC: 8 10*3/uL (ref 4.0–10.5)
WBC: 7.6 10*3/uL (ref 4.0–10.5)
nRBC: 0 % (ref 0.0–0.2)
nRBC: 0 % (ref 0.0–0.2)

## 2020-05-09 LAB — LACTIC ACID, PLASMA
Lactic Acid, Venous: 1.4 mmol/L (ref 0.5–1.9)
Lactic Acid, Venous: 0.6 mmol/L (ref 0.5–1.9)

## 2020-05-09 LAB — GLUCOSE, CAPILLARY
Glucose-Capillary: 109 mg/dL — ABNORMAL HIGH (ref 70–99)
Glucose-Capillary: 172 mg/dL — ABNORMAL HIGH (ref 70–99)
Glucose-Capillary: 61 mg/dL — ABNORMAL LOW (ref 70–99)
Glucose-Capillary: 69 mg/dL — ABNORMAL LOW (ref 70–99)
Glucose-Capillary: 72 mg/dL (ref 70–99)
Glucose-Capillary: 76 mg/dL (ref 70–99)
Glucose-Capillary: 79 mg/dL (ref 70–99)
Glucose-Capillary: 86 mg/dL (ref 70–99)

## 2020-05-09 LAB — LACTATE DEHYDROGENASE: LDH: 291 U/L — ABNORMAL HIGH (ref 98–192)

## 2020-05-09 LAB — FIBRINOGEN: Fibrinogen: 449 mg/dL (ref 210–475)

## 2020-05-09 LAB — APTT
aPTT: 61 seconds — ABNORMAL HIGH (ref 24–36)
aPTT: 87 seconds — ABNORMAL HIGH (ref 24–36)

## 2020-05-09 LAB — PROTIME-INR
INR: 1.6 — ABNORMAL HIGH (ref 0.8–1.2)
Prothrombin Time: 18.8 seconds — ABNORMAL HIGH (ref 11.4–15.2)

## 2020-05-09 MED ORDER — ROCURONIUM BROMIDE 10 MG/ML (PF) SYRINGE
PREFILLED_SYRINGE | INTRAVENOUS | Status: AC
  Filled 2020-05-09: qty 10

## 2020-05-09 MED ORDER — GUAIFENESIN 100 MG/5ML PO SOLN: 5.0000 mL | ORAL | Status: DC | PRN

## 2020-05-09 MED ORDER — DEXTROSE 10 % IV SOLN: INTRAVENOUS | Status: DC

## 2020-05-09 MED ORDER — HYDROCOD POLST-CPM POLST ER 10-8 MG/5ML PO SUER
5.0000 mL | Freq: Two times a day (BID) | ORAL | Status: DC
  Administered 2020-05-09 – 2020-05-12 (×7): 5 mL
  Filled 2020-05-09 (×7): qty 5

## 2020-05-09 MED ORDER — MELATONIN 5 MG PO TABS
5.0000 mg | ORAL_TABLET | Freq: Every day | ORAL | Status: DC
  Administered 2020-05-11: 5 mg
  Filled 2020-05-09: qty 1

## 2020-05-09 MED ORDER — MIDAZOLAM HCL 2 MG/2ML IJ SOLN
INTRAMUSCULAR | Status: AC
  Filled 2020-05-09: qty 4

## 2020-05-09 MED ORDER — SIMETHICONE 40 MG/0.6ML PO SUSP
40.0000 mg | Freq: Four times a day (QID) | ORAL | Status: DC | PRN
  Administered 2020-05-11: 40 mg
  Filled 2020-05-09 (×2): qty 0.6

## 2020-05-09 MED ORDER — GABAPENTIN 250 MG/5ML PO SOLN
300.0000 mg | Freq: Two times a day (BID) | ORAL | Status: DC
  Administered 2020-05-09 – 2020-05-12 (×7): 300 mg
  Filled 2020-05-09 (×9): qty 6

## 2020-05-09 MED ORDER — QUETIAPINE FUMARATE 50 MG PO TABS
50.0000 mg | ORAL_TABLET | Freq: Every day | ORAL | Status: DC
  Administered 2020-05-09 – 2020-05-11 (×3): 50 mg
  Filled 2020-05-09 (×3): qty 1

## 2020-05-09 MED ORDER — VANCOMYCIN HCL 1000 MG/200ML IV SOLN
1000.0000 mg | Freq: Two times a day (BID) | INTRAVENOUS | Status: DC
  Administered 2020-05-09 – 2020-05-11 (×3): 1000 mg via INTRAVENOUS
  Filled 2020-05-09 (×3): qty 200

## 2020-05-09 MED ORDER — ACETAMINOPHEN 160 MG/5ML PO SOLN
650.0000 mg | Freq: Four times a day (QID) | ORAL | Status: DC | PRN
  Administered 2020-05-09 – 2020-05-11 (×2): 650 mg
  Filled 2020-05-09 (×2): qty 20.3

## 2020-05-09 MED ORDER — ETOMIDATE 2 MG/ML IV SOLN
INTRAVENOUS | Status: AC
  Filled 2020-05-09: qty 20

## 2020-05-09 MED ORDER — POLYETHYLENE GLYCOL 3350 17 G PO PACK: 17.0000 g | PACK | Freq: Every day | ORAL | Status: DC | PRN

## 2020-05-09 MED ORDER — CHLORHEXIDINE GLUCONATE 0.12% ORAL RINSE (MEDLINE KIT)
15.0000 mL | Freq: Two times a day (BID) | OROMUCOSAL | Status: DC
  Administered 2020-05-09 – 2020-05-12 (×7): 15 mL via OROMUCOSAL

## 2020-05-09 MED ORDER — CLONAZEPAM 1 MG PO TABS
1.0000 mg | ORAL_TABLET | Freq: Every day | ORAL | Status: DC
  Administered 2020-05-10 – 2020-05-11 (×2): 1 mg
  Filled 2020-05-09 (×2): qty 1

## 2020-05-09 MED ORDER — ADULT MULTIVITAMIN LIQUID CH
15.0000 mL | Freq: Every day | ORAL | Status: DC
  Administered 2020-05-09 – 2020-05-12 (×4): 15 mL
  Filled 2020-05-09 (×5): qty 15

## 2020-05-09 MED ORDER — DEXMEDETOMIDINE HCL IN NACL 400 MCG/100ML IV SOLN
0.4000 ug/kg/h | INTRAVENOUS | Status: DC
  Administered 2020-05-09: 0.8 ug/kg/h via INTRAVENOUS
  Administered 2020-05-09 – 2020-05-10 (×6): 1.2 ug/kg/h via INTRAVENOUS
  Administered 2020-05-10 (×2): 0.4 ug/kg/h via INTRAVENOUS
  Administered 2020-05-10: 1 ug/kg/h via INTRAVENOUS
  Administered 2020-05-12 – 2020-05-13 (×3): 1.2 ug/kg/h via INTRAVENOUS
  Filled 2020-05-09: qty 200
  Filled 2020-05-09 (×11): qty 100

## 2020-05-09 MED ORDER — DOCUSATE SODIUM 50 MG/5ML PO LIQD
100.0000 mg | Freq: Two times a day (BID) | ORAL | Status: DC
  Administered 2020-05-09 – 2020-05-11 (×6): 100 mg
  Filled 2020-05-09 (×6): qty 10

## 2020-05-09 MED ORDER — EPINEPHRINE 1 MG/10ML IJ SOSY
PREFILLED_SYRINGE | INTRAMUSCULAR | Status: AC
  Filled 2020-05-09: qty 10

## 2020-05-09 MED ORDER — CLONAZEPAM 1 MG PO TABS: 1.0000 mg | ORAL_TABLET | Freq: Four times a day (QID) | ORAL | Status: DC | PRN

## 2020-05-09 MED ORDER — POTASSIUM CHLORIDE 20 MEQ PO PACK
40.0000 meq | PACK | ORAL | Status: AC
  Administered 2020-05-09 (×2): 40 meq
  Filled 2020-05-09 (×2): qty 2

## 2020-05-09 MED ORDER — MIDAZOLAM HCL 2 MG/2ML IJ SOLN
4.0000 mg | Freq: Once | INTRAMUSCULAR | Status: AC
  Administered 2020-05-09: 4 mg via INTRAVENOUS

## 2020-05-09 MED ORDER — PIPERACILLIN-TAZOBACTAM 3.375 G IVPB 30 MIN
3.3750 g | Freq: Four times a day (QID) | INTRAVENOUS | Status: DC
  Administered 2020-05-09 – 2020-05-10 (×5): 3.375 g via INTRAVENOUS
  Filled 2020-05-09 (×11): qty 50

## 2020-05-09 MED ORDER — PIPERACILLIN-TAZOBACTAM 3.375 G IVPB: 3.3750 g | Freq: Three times a day (TID) | INTRAVENOUS | Status: DC

## 2020-05-09 MED ORDER — ORAL CARE MOUTH RINSE
15.0000 mL | OROMUCOSAL | Status: DC
  Administered 2020-05-09 – 2020-05-12 (×33): 15 mL via OROMUCOSAL

## 2020-05-09 MED ORDER — BETHANECHOL CHLORIDE 10 MG PO TABS
10.0000 mg | ORAL_TABLET | Freq: Three times a day (TID) | ORAL | Status: DC
  Administered 2020-05-09 – 2020-05-12 (×10): 10 mg
  Filled 2020-05-09 (×10): qty 1

## 2020-05-09 MED ORDER — CLONAZEPAM 0.5 MG PO TABS
0.5000 mg | ORAL_TABLET | Freq: Every day | ORAL | Status: DC
  Administered 2020-05-09 – 2020-05-12 (×4): 0.5 mg
  Filled 2020-05-09 (×4): qty 1

## 2020-05-09 MED ORDER — DEXTROMETHORPHAN POLISTIREX ER 30 MG/5ML PO SUER
30.0000 mg | Freq: Three times a day (TID) | ORAL | Status: DC
  Administered 2020-05-09 – 2020-05-12 (×9): 30 mg
  Filled 2020-05-09 (×13): qty 5

## 2020-05-09 MED ORDER — FENTANYL CITRATE (PF) 100 MCG/2ML IJ SOLN
INTRAMUSCULAR | Status: AC
  Filled 2020-05-09: qty 2

## 2020-05-09 MED ORDER — SENNOSIDES 8.8 MG/5ML PO SYRP
5.0000 mL | ORAL_SOLUTION | Freq: Every day | ORAL | Status: DC
  Administered 2020-05-09 – 2020-05-11 (×3): 5 mL
  Filled 2020-05-09 (×3): qty 5

## 2020-05-09 MED ORDER — FENTANYL 2500MCG IN NS 250ML (10MCG/ML) PREMIX INFUSION
0.0000 ug/h | INTRAVENOUS | Status: DC
  Administered 2020-05-09: 150 ug/h via INTRAVENOUS
  Administered 2020-05-09: 200 ug/h via INTRAVENOUS
  Administered 2020-05-10: 400 ug/h via INTRAVENOUS
  Administered 2020-05-10: 100 ug/h via INTRAVENOUS
  Administered 2020-05-10 – 2020-05-13 (×6): 400 ug/h via INTRAVENOUS
  Filled 2020-05-09 (×10): qty 250

## 2020-05-09 MED ORDER — DOXAZOSIN MESYLATE 4 MG PO TABS
4.0000 mg | ORAL_TABLET | Freq: Every day | ORAL | Status: DC
  Administered 2020-05-09: 4 mg
  Filled 2020-05-09 (×2): qty 1

## 2020-05-09 MED ORDER — VANCOMYCIN HCL 1500 MG/300ML IV SOLN
1500.0000 mg | Freq: Once | INTRAVENOUS | Status: AC
  Administered 2020-05-09: 1500 mg via INTRAVENOUS
  Filled 2020-05-09: qty 300

## 2020-05-09 MED ORDER — SERTRALINE HCL 50 MG PO TABS
25.0000 mg | ORAL_TABLET | Freq: Every day | ORAL | Status: DC
  Administered 2020-05-09 – 2020-05-12 (×4): 25 mg
  Filled 2020-05-09 (×4): qty 1

## 2020-05-09 MED ORDER — NOREPINEPHRINE 4 MG/250ML-% IV SOLN
0.0000 ug/min | INTRAVENOUS | Status: DC
  Administered 2020-05-10: 2 ug/min via INTRAVENOUS
  Administered 2020-05-11: 3 ug/min via INTRAVENOUS
  Filled 2020-05-09: qty 250

## 2020-05-09 MED ORDER — ACETAZOLAMIDE 250 MG PO TABS
250.0000 mg | ORAL_TABLET | Freq: Two times a day (BID) | ORAL | Status: DC
  Administered 2020-05-09 – 2020-05-10 (×4): 250 mg
  Filled 2020-05-09 (×5): qty 1

## 2020-05-09 MED ORDER — DEXTROSE 10 % IV SOLN: INTRAVENOUS | Status: AC

## 2020-05-09 MED ORDER — DEXTROSE 50 % IV SOLN
12.5000 g | INTRAVENOUS | Status: AC
  Administered 2020-05-09: 12.5 g via INTRAVENOUS
  Filled 2020-05-09: qty 50

## 2020-05-09 MED ORDER — NOREPINEPHRINE 4 MG/250ML-% IV SOLN
INTRAVENOUS | Status: AC
  Administered 2020-05-09: 2 ug/min via INTRAVENOUS
  Filled 2020-05-09: qty 250

## 2020-05-09 MED ORDER — ALTEPLASE 2 MG IJ SOLR
2.0000 mg | Freq: Once | INTRAMUSCULAR | Status: AC
  Administered 2020-05-09: 2 mg
  Filled 2020-05-09: qty 2

## 2020-05-09 MED ORDER — DILTIAZEM HCL 60 MG PO TABS
30.0000 mg | ORAL_TABLET | Freq: Every day | ORAL | Status: DC
  Administered 2020-05-09: 30 mg
  Filled 2020-05-09: qty 1

## 2020-05-09 MED ORDER — PANTOPRAZOLE SODIUM 40 MG PO PACK
40.0000 mg | PACK | Freq: Every day | ORAL | Status: DC
  Administered 2020-05-09 – 2020-05-11 (×3): 40 mg
  Filled 2020-05-09 (×3): qty 20

## 2020-05-09 MED ORDER — OXYCODONE HCL 5 MG PO TABS: 15.0000 mg | ORAL_TABLET | ORAL | Status: DC | PRN

## 2020-05-09 NOTE — Progress Notes (Signed)
Hypoglycemic Event  CBG: 61  Treatment: D50 25 mL (12.5 gm)  Symptoms: None  Follow-up CBG: Time:2028 CBG Result:109  Possible Reasons for Event: Inadequate meal intake  Comments/MD notified:n/a    Devante M Cammon

## 2020-05-09 NOTE — Progress Notes (Signed)
Cross covering ICU physician:   Called emergently to bedside for hypotension and unresponsiveness. Air in circuit on return side after volume BP stabilized but air cont to occur. Dr Vickey Sages at bedside and clearly it was coming from insertion site so new suture placed around cannula.   Air alarm still occurring. New cvc was placed on L IJ and Dr Gala Romney at bedside with Dr Tonia Brooms to attempt reposition of crescent cannula (advancing). Audible air heard as well and circuit req venting.   Suspect he may need return to cath lab/OR for placement at different site to circumvent this issue.   Notified Son [name] and friend Zollie Beckers via phone. All questions answered to best of my ability. They are agreeable to any necessary interventions.   Critical care time: The patient is critically ill with multiple organ systems failure and requires high complexity decision making for assessment and support, frequent evaluation and titration of therapies, application of advanced monitoring technologies and extensive interpretation of multiple databases.  Critical care time 51 mins. This represents my time independent of the NPs time taking care of the pt. This is excluding procedures.    Briant Sites DO [facility] 05/09/2020, 5:12 AM See Amion for pager If no response to pager, please call [phone] until 1900 After 1900 please call [facility] [phone]

## 2020-05-09 NOTE — Progress Notes (Signed)
0300- ECMO specialist notified this RN that patient was complaining of not being able to catch his breath and lost of flow on ECMO circuit. Icard and Atkins paged. Albumin given for volume x3 and levophed started for hypotension. Patient loss of consciousness. MD Gaynell Face to bedside for intubation. Echo obtained. Verbal order for 1 unit of PRBC given emergently. Atkins, Icard, and Bensimhon to bedside. A total of 200 mg of rocuronium, 20 mg of etomidate, 100 mcg of fentanyl, and 10 mg of versed given emergently from 0305 to 0555.

## 2020-05-09 NOTE — Progress Notes (Signed)
ECMO Specialist and Bedside RN attempted to slightly turn patient for pressure injury prevention. ECMO Circuit detected a venous bubble. Circuit was checked for any additional air and none noted. Bubble detector reset and decision was made to leave patient in current position.

## 2020-05-09 NOTE — Procedures (Signed)
Bronchoscopy Procedure Note  Jylan Loeza  [id number]  29-Jan-1971  Date:05/09/20  Time:6:01 AM   Provider Performing:Lucca Ballo Ruthann Cancer   Procedure(s):  Flexible bronchoscopy with bronchial alveolar lavage [phone])  Indication(s) Secretions, ecmo  Consent Unable to obtain consent due to emergent nature of procedure.  Anesthesia See mar    Time Out Verified patient identification, verified procedure, site/side was marked, verified correct patient position, special equipment/implants available, medications/allergies/relevant history reviewed, required imaging and test results available.   Sterile Technique Usual hand hygiene, masks, gowns, and gloves were used   Procedure Description Bronchoscope advanced through endotracheal tube and into airway.  Airways were examined down to subsegmental level with findings noted below.   Following diagnostic evaluation, BAL(s) performed in RUL with normal saline and return of 26m fluid  Findings: fair amount of secretions, thick tan. Edematous airways.    Complications/Tolerance None; patient tolerated the procedure well. Chest X-ray is needed post procedure.   EBL none   Specimen(s) Bal

## 2020-05-09 NOTE — Progress Notes (Signed)
During [employer] began to red alarm due to arterial bubble detector being set off. ECMO Specialist, Bedside RN and Dr. Gala Romney at bedside. 2 syringes of blood pulled from post-oxygenator pigtail to expel air and cannulas were inspected for additional air with none noted within the circuit. Arterial bubble alarm reset and flows resumed at 4.7 with 3400 rpms. Cannula site was inspected showing small crack at the insertion site. Surgical glue was used to seal site along with occlusive pressure dressing by Dr Gala Romney.

## 2020-05-09 NOTE — Progress Notes (Signed)
ANTICOAGULATION CONSULT NOTE  Pharmacy Consult for bivalirudin Indication: ECMO and VTE  Labs: Recent Labs    05/07/20 0427 05/07/20 0624 05/08/20 0343 05/08/20 0636 05/08/20 1624 05/08/20 1630 05/09/20 0140 05/09/20 0410 05/09/20 0422 05/09/20 1651 05/09/20 1652 05/09/20 1758  HGB 8.3*   < > 8.3*   < > 9.0*   < > 8.7*  --    < > 9.2* 9.2* 8.8*  HCT 24.2*   < > 25.9*   < > 28.3*   < > 25.4*  --    < > 27.6* 27.0* 26.0*  PLT 246   < > 257  --  263  --  238  --   --  191  --   --   APTT 68*   < > 65*  --  63*  --   --  61*  --  87*  --   --   LABPROT 17.5*  --  17.6*  --   --   --   --  18.8*  --   --   --   --   INR 1.5*  --  1.5*  --   --   --   --  1.6*  --   --   --   --   CREATININE 0.68   < > 0.77  --  0.65  --   --  0.95  --  0.77  --   --    < > = values in this interval not displayed.    Assessment: 68 yoM admitted with COVID-19 PNA with worsening hypoxia, s/p cannulation for ECMO. Pt was started on IV heparin prior to cannulation due to acute DVTs and possible PE, transitioned to bivalirudin with ECMO. S/p tPA on 1/5 and tracheostomy on 1/6. ECMO circuit changed on 05/03/2020  APTT now up to 87 sec (above goal) this evening on bivalirudin 0.095mg /kg/hr.  No issues with infusion per RN. No overt bleeding or complications noted. CBC stable. LDH stable ~300  Goal of Therapy:  aPTT 60-80 seconds   Plan:  - Since pt so stable on 0.095 mg/kg/hr for many days, will decrease bivalirudin slightly to 0.09 mg/kg/hr (using order-specific wt 72.1kg) -F/u 5 hr PTT  Christoper Fabian, PharmD, BCPS Please see amion for complete clinical pharmacist phone list 05/09/2020 6:34 PM

## 2020-05-09 NOTE — Progress Notes (Signed)
  Patient with recurrent air entrainment when I rotated his neck to reposition LIJ central line with marked bradycardia and near arrest. Unable to get central line wire into RV to redirect catheter out of RIJ. Central line removed.  Left subclavian vein ultrasounded with Dr. Tonia Brooms. It seemed very deep (5cm) and diminutive. Previous CT reviewed and vein seems small.  I discussed case with Drs. Lenor Derrick, Icard and Dr. Darrick Penna from [facility].   Consensus is that L subclavian is not suitable for relocation of ECMO cannula. Femoral cannulation not ideal as patient has to be ambulatory to qualify for transplant. Respiratory status not sufficient for decannulation. Thus only option is to try to salvage RIJ ECMO site.   In discussing with [facility], attempting to suture RIJ around ECMO cannula would not be feasible and best plan is to continue to reinforce surrounding skin/tissue at ECMO insertion site.   Insertion site already with multiple firm sutures. The skin is macerated and has several area of breakdown. I placed 2 tubes of skin glue and reinforced with a sterile dressing with an overlying pressure dressing.   He remains critically ill. Options increasingly limited.   Total additional CCT > 2 hours.   Arvilla Meres, MD  11:14 AM

## 2020-05-09 NOTE — Progress Notes (Addendum)
Around 0335, heard excessive alarms going off on the cardiohelp (ECMO pump). Immediately rush to the bedside, noted decrease flows and air bubble alarm going off. Noted Hypotension and decrease saturations to 81%. Blood, Albumin, and Levo given refer to MAR/Nursing. Cannulas were inspected/assessed for any air bubbles that may have been entrained into the circuit. Connections are tight. Myself and RN/ECMO Specialist Lurena Joiner noted no air bubbles in the circuit. In the middle of my assessment to see if I could locate any air bubbles noted large amounts of blood oozing from patient insertion cannulation site. Completely spontaneous blood oozing. Apparently air had been coming in from the insertion site. Dr Gala Romney, Dr Vickey Sages, Dr Tonia Brooms, Dr Gaynell Face at bedside to assess. Insertion site was resutured with new sutures per MD. Patient is now re-intubated per MD Gaynell Face w/o complications . Patient has been bronch per MD Gaynell Face. Airways appear to be edematous but no apparent inflammatory mediator process noted. Moderate amount of tan secretions noted and suctioned from patient airways. Secretions sent to the lab. TEE performed per MD Bensimhon to assure placement of cannula. Confirmation noted per MD Bensimhon. Patient is to now remain supinated at this time per MD Icard. Patient clinical presentation at this time is now stable, and hemodynamics are stable.      Loyalty Brashier L. Katrinka Blazing, BS, RRT, RCP

## 2020-05-09 NOTE — Procedures (Signed)
Extracorporeal support note  ECLS cannulation date: Mar 20, 2020 Last circuit change: 05/03/20  Major Circuit Event: Air Entrainment w/ Acute Pulmonary Air Embolism 05/09/2020  Indication: Acute hypoxemic respiratory failure secondary to COVID-19 pneumonia  Configuration: Venovenous ECMO  Drainage cannula: [address] cannula within the right internal jugular Return cannula: Same  Pump speed: Approximately 3400 RPM Pump flow: Flow (LPM): 4.79  Pump used: Cardio help  Oxygenator: Cardio help O2 blender: 100% Sweep gas: 4 L  Circuit check: Early this morning around 3 AM with documented air in venous lines as well as oxygenator.  Team assembled for air removal.  Circuit was shut down 2 separate times.  Both x60 cc of blood were aspirated along with air bubbles.  One large witnessed event we were able to see approximately 6 inches of air entrained within the venous cannula.  Anticoagulant: Bivalirudin Anticoagulation targets: PTT goal 60-80  Changes in support:  Patient was reintubated today Pending neurologic recovery we will need to consider placement of cannula. Please see separate documentation regarding cannula reposition and resuturing to help close percutaneous venous fistula formation which was deemed to be the site of air entrainment.  Anticipated goals/duration of support: Bridge to recovery  Rounding was complete this morning with multidisciplinary team at bedside.  Josephine Igo, DO [facility] 05/09/2020 8:08 AM

## 2020-05-09 NOTE — Progress Notes (Addendum)
PCCM:  Late note documentation.  I was notified at approximately 3 AM this morning of air entrainment within the ECMO circuit and immediate circulatory collapse of the patient. Members of the ECMO team or contacted and mobilized.  Called and spoke with Alex [name], Alex [name]. Bedside ECMO coordinator contacted Alex [name].  Orders were given for blood product resuscitation plus institution of norepinephrine to maintain mean arterial pressure. I contacted our nocturnal ground team Alex [name] who was immediately present at bedside. Patient was unresponsive and decision was made for endotracheal intubation and since the patient had been doing so well his only access was a PIV prior to this. Therefore central venous access was necessary at this time. Please see separate procedure notes.  Once at bedside we spent time reviewing all circuit connections. The cardio help venous bubble alarm continue to go off. Occasionally there would be an arterial bubble alarm. Patient's hemodynamics were restabilized with vasopressors and product resuscitation. For a brief period his P venous pressure dropped significantly and was unable to flow no more than 2.5L. But this was only for less than 10 to 15 minutes.  Alex [name] was the first to take the dressing down from the VV ECMO cannula. We were able to identify what appeared to be a fistulous track from skin to the venous system that was allowing air entrainment. With manipulation of the patient's neck with any slight rotation would allow audible air entrainment and alarm of the pump. 2 separate times the ECMO team members were directed in temporary pauses of circuit, clamping of arterial line and aspiration of two 60 cc syringes of blood for air removal from the oxygenator.  Alex [name] and myself later in the morning took down the 32 French catheter sutures for cannula repositioning.  We used silk sutures for a annular pursestring as well as sutures through the  skin placing tension between the skin and cannula.  Once we were done with this there was no more audible air entrainment.  Presumed diagnosis of air entrainment and acute pulmonary air embolism   Full progress note to follow.  This patient is critically ill with multiple organ system failure; which, requires frequent high complexity decision making, assessment, support, evaluation, and titration of therapies. This was completed through the application of advanced monitoring technologies and extensive interpretation of multiple databases. During this encounter critical care time was devoted to patient care services described in this note for 125  minutes.   Alex Igo, DO [facility] 05/09/2020 7:30 AM

## 2020-05-09 NOTE — CV Procedure (Signed)
    TRANSESOPHAGEAL ECHOCARDIOGRAM   NAME:  Alex [name]   MRN: [id number] DOB:  03-25-1970   ADMIT DATE: 02/17/2020  INDICATIONS:  Acute hypoxic respiratory failure. Assess ECMO cannula position  PROCEDURE:   Emergent consent. Time out take. Patient already sedated on the vent.  The transesophageal probe was inserted in the esophagus and stomach without difficulty and multiple views were obtained.    COMPLICATIONS:    There were no immediate complications.  FINDINGS:  LEFT VENTRICLE: EF = 55-60%. No regional wall motion abnormalities.  RIGHT VENTRICLE: Normal size and function.   LEFT ATRIUM: Normal  LEFT ATRIAL APPENDAGE: Not visualized  RIGHT ATRIUM: Normal. ECMO cannula outflow site well positioned in superior RA with good flow. Catheter then seen coursing into IVC with no evidence of recirculation  AORTIC VALVE:  Trileaflet. No AI/AS  MITRAL VALVE:    Normal. Trivial MR  TRICUSPID VALVE: Normal. Mild TR  PULMONIC VALVE: Grossly normal.  INTERATRIAL SEPTUM: No PFO or ASD.  PERICARDIUM: No effusion  DESCENDING AORTA: Not visualized   CONCLUSION:  Appropriate ECMO cannula position.  Jylan Loeza,MD 6:27 AM

## 2020-05-09 NOTE — Procedures (Signed)
Central Venous Catheter Insertion Procedure Note  Lopaka Karge  [id number]  02-14-1971  Date:05/09/20  Time:5:14 AM   Provider Performing:Chrisie Jankovich Gaynell Face   Procedure: Insertion of Non-tunneled Central Venous Catheter(36556) with US guidance (93716)   Indication(s) Difficult access  Consent Unable to obtain consent due to emergent nature of procedure.  Anesthesia see mar  Timeout Verified patient identification, verified procedure, site/side was marked, verified correct patient position, special equipment/implants available, medications/allergies/relevant history reviewed, required imaging and test results available.  Sterile Technique Maximal sterile technique including full sterile barrier drape, hand hygiene, sterile gown, sterile gloves, mask, hair covering, sterile ultrasound probe cover (if used).  Procedure Description Area of catheter insertion was cleaned with chlorhexidine and draped in sterile fashion.  With real-time ultrasound guidance a central venous catheter was placed into the left internal jugular vein. Nonpulsatile blood flow and easy flushing noted in all ports.  The catheter was sutured in place and sterile dressing applied.  Complications/Tolerance None; patient tolerated the procedure well. Chest X-ray is ordered to verify placement for internal jugular or subclavian cannulation.   Chest x-ray is not ordered for femoral cannulation.  EBL Minimal  Specimen(s) None   Line was done under u/s guidance. Easily compressible vein noted with neighboring pulsatile artery. Upon stick dark red non pulsatile blood was noted. Wire easily advanced. Wire was verified to be in easily compressible/non pulsatile vessel in both cross sectional and longitudinal views under ultrasound guidance. Skin was easily dilated and catheter advanced without issue. Wire was withdrawn from vessel. No air was aspirated thru entirety of the procedure. Pt tolerated well. No complications  were appreciated.

## 2020-05-09 NOTE — Progress Notes (Signed)
Blood given emergently. Verified by Rayfield Citizen RN and Danella Deis, RN.  [phone]

## 2020-05-09 NOTE — Procedures (Signed)
Intubation Procedure Note  Alex [name]  [id number]  [birth year]  Date:05/09/20  Time:5:13 AM   Provider Performing:Aanyah Loa Gaynell Face    Procedure: Intubation (31500)  Indication(s) Respiratory Failure  Consent Unable to obtain consent due to emergent nature of procedure.   Anesthesia see mar   Time Out Verified patient identification, verified procedure, site/side was marked, verified correct patient position, special equipment/implants available, medications/allergies/relevant history reviewed, required imaging and test results available.   Sterile Technique Usual hand hygeine, masks, and gloves were used   Procedure Description Patient positioned in bed supine.  Sedation given as noted above.  Patient was intubated with endotracheal tube using Glidescope.  View was Grade 1 full glottis .  Number of attempts was 1.  Colorimetric CO2 detector was consistent with tracheal placement.   Complications/Tolerance None; patient tolerated the procedure well. Chest X-ray is ordered to verify placement.   EBL none   Specimen(s) None

## 2020-05-09 NOTE — Progress Notes (Signed)
  Echocardiogram Echocardiogram Transesophageal has been performed.  Delcie Roch 05/09/2020, 6:01 AM

## 2020-05-09 NOTE — Progress Notes (Incomplete)
Palliative:  HPI: 50 y.o. male  with past medical history of CHF, s/p [employer] PPM/ICD, CAD, IDDM, CKD stage 3b, prostatomegaly admitted on 08/10/2021 with weakness x 3 days after fall followed with pain, difficulty to ambulate, decrease intake and urine output. Noted to have hypotension, leukocytosis related to discitis aspiration showing +Klebsiella along with UTI. Hospitalization complicated by cardiogenic shock EF <20%, grade 2 diastolic dysfunction, moderate pulmonary hypertension, renal failure, bladder outlet obstruction, paroxysmal atrial fibrillation. Ongoing significant fluid overload and poor response to diuresis. Trial large dose of Lasix while on dobutamine.   I met again today with Alex [name] along with his daughter and wife at bedside. Alex [name] continues to speak to his poor prognosis and is able to tell his family that he is very much at peace. He reviews that his vital signs have been good but he understands that his heart is "give out" - he says that his heart has served him well for many good years. He is a very spiritual man and reports that he had wonderful visits with chaplain Ellen. He is at peace and is prepared for transition to comfort care when his other daughter returns from [place]. Family at bedside report that they believed she would be back Thurs/Fri but she will not return until Monday July 3rd. Alex [name] was disheartened that she does not return until Monday but they all continue to report their goal to continue to maintain until she returns and then we will transition to full comfort care and allow natural and peaceful death.   I spoke with them again about my recommendation for deactivation of AICD and they all agree that this would be in his best interest at this time. I explained the process and will place order for [employer] to come to deactivate.   All questions/concerns addressed. Emotional support provided.   Exam: Alert, oriented. Frail.  Lying in bed. No distress. Generalized weakness and fatigue. HR 90-100s. Breathing regular, unlabored at rest. Abd soft. BLE edema.   Plan: - DNR - Deactivate AICD - Awaiting daughter's return from vacation to say goodbye and then will transition to full comfort care (she should be back Monday)  40 min  Alicia Parker, NP Palliative Medicine Team Pager [phone] (Please see [email or web address] for schedule) Team Phone [phone]    Greater than 50%  of this time was spent counseling and coordinating care related to the above assessment and plan   

## 2020-05-09 NOTE — CV Procedure (Signed)
  ECMO/VAD Cannula reposition  Indication: Massive air entrainment  Operators: Icard, Haylie Mccutcheon  After an appropriate time out was taken, the right neck and ECMO cannula was prepped and dapped sterilely. The majority of the pre-existing ECMO cannula sutures were removed except for skin sutures.    The ECMO cannula was then advanced 2-3 cm based on appearance of CXR.   A pursestring suture was placed around the ECMO insertion site using 1.0 silk and secured tightly. Two more skin retention sutures were also placed. Sutures were then placed up the neck to hold the ECMO cannula tightly in place. No further air entrainment was noted. A sterile dressing was placed and a headband was placed to further secure the cannula.   Sharp count was performed.   No obvious complications.   Arvilla Meres, MD  6:26 AM

## 2020-05-09 NOTE — Progress Notes (Signed)
ANTICOAGULATION CONSULT NOTE  Pharmacy Consult for bivalirudin Indication: ECMO and VTE  Labs: Recent Labs    05/07/20 0427 05/07/20 0624 05/08/20 0343 05/08/20 0636 05/08/20 1624 05/08/20 1630 05/09/20 0140 05/09/20 0410 05/09/20 0422 05/09/20 0619  HGB 8.3*   < > 8.3*   < > 9.0*   < > 8.7*  --  7.8* 8.8*  HCT 24.2*   < > 25.9*   < > 28.3*   < > 25.4*  --  23.0* 26.0*  PLT 246   < > 257  --  263  --  238  --   --   --   APTT 68*   < > 65*  --  63*  --   --  61*  --   --   LABPROT 17.5*  --  17.6*  --   --   --   --  18.8*  --   --   INR 1.5*  --  1.5*  --   --   --   --  1.6*  --   --   CREATININE 0.68   < > 0.77  --  0.65  --   --  0.95  --   --    < > = values in this interval not displayed.    Assessment: 42 yoM admitted with COVID-19 PNA with worsening hypoxia, s/p cannulation for ECMO. Pt was started on IV heparin prior to cannulation due to acute DVTs and possible PE, transitioned to bivalirudin with ECMO. S/p tPA on 1/5 and tracheostomy on 1/6. ECMO circuit changed on 05/03/2020  Events of last pm noted  Aptt 61sec this morning  within goal on bivalirudin 0.095mg /kg/hr.  No overt bleeding or complications noted. CBC stable LDH stable 300s  Goal of Therapy:  aPTT 60-80 seconds   Plan:  -continue bivalirudin to 0.095 mg/kg/hr (using order-specific wt 72.1kg) -Monitor q12h aPTT/CBC, LDH, and for s/sx of bleeding    [name] Pharm.D. CPP, BCPS Clinical Pharmacist [phone] 05/09/2020 9:18 AM   [facility] pharmacy phone numbers are listed on [email or web address]

## 2020-05-09 NOTE — Progress Notes (Signed)
RT, ECMO specialist and MD at bedside to perform bronch.  Moderate amount of secretions removed from airway and no trauma caused.  Small amount of saline introduced and removed via suction.  RT will continue to monitor

## 2020-05-09 NOTE — Progress Notes (Signed)
Pharmacy Antibiotic Note  Alex [name] is a 50 y.o. male on ECMO. Pharmacy consulted to dose vancomycin and zosyn for aspiration pneumonia and prophylaxis after central line placement / ecmo cannula reposition.  Cr 0.9 crcl stable 32ml/min  Will use previous vancomycin dosing that provided troughs 15-19  Plan: Zosyn 3.375gm Q6h (std infusion)  Vancomycin 1500mg  x1 then 1gm q12h   Height: 5\' 4"  (162.6 cm) Weight: 77 kg (169 lb 12.1 oz) IBW/kg (Calculated) : 59.2  Temp (24hrs), Avg:99.5 F (37.5 C), Min:98.4 F (36.9 C), Max:101.8 F (38.8 C)  Recent Labs  Lab 05/07/20 0427 05/07/20 1512 05/08/20 0343 05/08/20 1624 05/09/20 0140 05/09/20 0410  WBC 6.6 9.2 7.7 11.4* 8.0  --   CREATININE 0.68 0.94 0.77 0.65  --  0.95  LATICACIDVEN 0.8 1.6 0.8 0.7  --  1.4    Estimated Creatinine Clearance: 90.1 mL/min (by C-G formula based on SCr of 0.95 mg/dL).    No Known Allergies   05/11/20 Pharm.D. CPP, BCPS Clinical Pharmacist [phone] 05/09/2020 12:22 PM    Please check [email or web address] for unit-specific pharmacy phone numbers.

## 2020-05-09 NOTE — Progress Notes (Signed)
Patient ID: Alex [name], male   DOB: 21-Feb-1971, 50 y.o.   MRN: [id number]     Advanced Heart Failure Rounding Note  PCP-Cardiologist: No primary care provider on file.   Subjective:    - 12/30: VV ECMO cannulation - 12/31: Left chest tube replaced - 1/2: Extubated. Echo with EF 60-65%, mildly dilated RV with mildly decreased systolic function.  - 1/4: Agitated, suspected aspiration.  Re-intubated.  - 1/5: ECMO cannula repositioned under TEE guidance. TEE showed moderately dilated/moderate-severely dysfunctional RV in setting of hypoxemia. LUE DVT found.  Patient got 1/2 dose of TPA due to initial concern for large PE.  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  - 1/6: Tracheostomy - 1/7: Echo with mild RV dilation/mild RV dysfunction.  - 1/16: Left chest tube out - 1/17: LUE arterial dopplers repeated, showed no obstruction.  - 1/20: Echo with EF 65-70%, mildly D-shaped septum, mildly dilated and mildly dysfunctional RV.  - 1/22: CTA chest: Bilateral upper lobe PEs (suspect chronic), changes c/w ARDS - 1/23: Bronchoscopy showed semi-occlusive ?mass/polyp in the trachea.  - 1/24: Bronchoscopy showed resolution of 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 - 1/26: ECMO circuit changed.  - 2/1: CT chest showed diffuse bronchiectasis as well as diffuse opacity consistent with COVID-19 PNA with ARDS. - 2/3: Trach exchange - 2/5: Decannulated to HFNC - 2/6: Enterococcal PNA/bacteremia.  Echo showed EF 65-70%, normal-appearing RV, no vegetation noted.  - 2/7: TEE with no vegetation, EF 55-60%, RV low normal function with normal size.  - 2/16: Right thoracentesis 500 cc - 2/22: ECMO circuit changed.  ECMO cannula repositioned under TEE guidance. TEE with EF 60-65%, normal RV.  - 2/27 Massive air entrainment due to leakage at cannula insertion site -> cannula repositioning & resuturing  - 2/27 Reintubated  Had massive air entrainment early this am due to leakage at cannula insertion site. Near  arrest. Re-intubated. Cannula repositioned and resutured. TEE confirmed position.   Lasix stopped   ECMO parameters: 3500 rpm Flow 4.9  L/min Pvenous -86 Delta P 23 Sweep 4 Vent 100%  ABG 7.40/61/236/100% LDH 329 => 301 => 309 => 291 PTT 61 Lactate 1.4 Hgb 7.8  Objective:   Weight Range: 77 kg Body mass index is 29.14 kg/m.   Vital Signs:   Temp:  [98.6 F (37 C)-101.8 F (38.8 C)] 101.8 F (38.8 C) (02/26 2300) Pulse Rate:  [98-127] 125 (02/27 0356) Resp:  [12-31] 12 (02/27 0356) BP: (112)/(57) 112/57 (02/27 0356) SpO2:  [92 %-100 %] 100 % (02/27 0356) Arterial Line BP: (108-186)/(50-77) 158/70 (02/27 0300) FiO2 (%):  [100 %] 100 % (02/27 0356) Weight:  [77 kg] 77 kg (02/26 0700) Last BM Date: 05/07/20  Weight change: Filed Weights   05/06/20 0600 05/07/20 0700 05/08/20 0700  Weight: 79.4 kg 77.8 kg 77 kg    Intake/Output:   Intake/Output Summary (Last 24 hours) at 05/09/2020 0610 Last data filed at 05/09/2020 0000 Gross per 24 hour  Intake 1886.32 ml  Output 3950 ml  Net -2063.68 ml      Physical Exam    General:  Intubated/sedated HEENT: normal Neck: supple. RIJ ECMO cannula. Carotids 2+ bilat; no bruits. No lymphadenopathy or thryomegaly appreciated. Cor: PMI nondisplaced. Regular tachy Lungs: decreased throughout Abdomen: soft, nontender, nondistended. No hepatosplenomegaly. No bruits or masses. Good bowel sounds. Extremities: no cyanosis, clubbing, rash, edema ischemic digits on LUE Neuro: intubated/seated   Telemetry   Sinus 100-120 Personally reviewed  Labs    CBC Recent Labs  05/08/20 1624 05/08/20 1630 05/09/20 0140 05/09/20 0422  WBC 11.4*  --  8.0  --   HGB 9.0*   < > 8.7* 7.8*  HCT 28.3*   < > 25.4* 23.0*  MCV 93.7  --  90.7  --   PLT 263  --  238  --    < > = values in this interval not displayed.   Basic Metabolic Panel Recent Labs    86/57/84 1624 05/08/20 1630 05/09/20 0410 05/09/20 0422  NA 134*   < > 134*  134*  K 3.9   < > 3.8 3.7  CL 94*  --  94*  --   CO2 33*  --  30  --   GLUCOSE 150*  --  138*  --   BUN 21*  --  21*  --   CREATININE 0.65  --  0.95  --   CALCIUM 8.8*  --  8.5*  --    < > = values in this interval not displayed.   Liver Function Tests No results for input(s): AST, ALT, ALKPHOS, BILITOT, PROT, ALBUMIN in the last 72 hours. No results for input(s): LIPASE, AMYLASE in the last 72 hours. Cardiac Enzymes No results for input(s): CKTOTAL, CKMB, CKMBINDEX, TROPONINI in the last 72 hours.  BNP: BNP (last 3 results) No results for input(s): BNP in the last 8760 hours.  ProBNP (last 3 results) No results for input(s): PROBNP in the last 8760 hours.   D-Dimer No results for input(s): DDIMER in the last 72 hours. Hemoglobin A1C No results for input(s): HGBA1C in the last 72 hours. Fasting Lipid Panel No results for input(s): CHOL, HDL, LDLCALC, TRIG, CHOLHDL, LDLDIRECT in the last 72 hours. Thyroid Function Tests No results for input(s): TSH, T4TOTAL, T3FREE, THYROIDAB in the last 72 hours.  Invalid input(s): FREET3  Other results:   Imaging    DG CHEST PORT 1 VIEW  Result Date: 05/09/2020 CLINICAL DATA:  Respiratory failure EXAM: PORTABLE CHEST 1 VIEW COMPARISON:  05/08/2020 FINDINGS: Endotracheal tube is seen 3.5 cm above the carina. Left upper extremity PICC line tip noted at the superior vena cava. ECMO catheter is unchanged with its middle marker at the superior cavoatrial junction, inferior marker at the inferior cavoatrial junction, and the distal tip of the catheter within the expected intrahepatic inferior vena cava. Pulmonary insufflation is stable. Extensive, diffuse airspace infiltrate is unchanged. No pneumothorax or pleural effusion. Cardiac silhouette is largely obscured, but grossly unremarkable. IMPRESSION: Stable examination. Unchanged diffuse, extensive airspace infiltrate. Electronically Signed   By: Helyn Numbers MD   On: 05/09/2020 04:25      Medications:     Scheduled Medications: . (feeding supplement) PROSource Plus  30 mL Oral TID BM  . sodium chloride   Intravenous Once  . acetaZOLAMIDE  250 mg Oral BID  . bethanechol  10 mg Oral TID  . carvedilol  25 mg Oral BID WC  . chlorhexidine  15 mL Mouth Rinse BID  . Chlorhexidine Gluconate Cloth  6 each Topical Daily  . chlorpheniramine-HYDROcodone  5 mL Oral Q12H  . clonazePAM  0.5 mg Oral Daily  . clonazePAM  1 mg Oral QHS  . dextromethorphan  30 mg Oral TID  . diltiazem  30 mg Oral Daily  . docusate sodium  100 mg Oral BID  . doxazosin  4 mg Oral Daily  . EPINEPHrine      . etomidate      . feeding supplement  237 mL  Oral TID WC & HS  . fentaNYL      . fiber  1 packet Oral BID  . furosemide  40 mg Intravenous Q8H  . gabapentin  300 mg Oral BID  . insulin aspart  0-20 Units Subcutaneous Q4H  . insulin detemir  20 Units Subcutaneous QHS  . insulin detemir  25 Units Subcutaneous Daily  . ipratropium-albuterol  3 mL Nebulization BID  . mouth rinse  15 mL Mouth Rinse q12n4p  . melatonin  5 mg Oral QHS  . metoCLOPramide (REGLAN) injection  10 mg Intravenous Q8H  . midazolam      . midazolam      . midazolam      . midazolam      . multivitamin with minerals  1 tablet Oral Daily  . pantoprazole sodium  40 mg Oral QHS  . QUEtiapine  50 mg Oral QHS  . rocuronium bromide      . rocuronium bromide      . senna  1 tablet Oral QHS  . sertraline  25 mg Oral Daily  . sodium chloride flush  10-40 mL Intracatheter Q12H    Infusions: . sodium chloride    . sodium chloride Stopped (04/29/20 1611)  . albumin human Stopped (04/23/20 0041)  . bivalirudin (ANGIOMAX) infusion 0.5 mg/mL (Non-ACS indications) 0.095 mg/kg/hr (05/09/20 0000)  . dexmedetomidine (PRECEDEX) IV infusion 0.8 mcg/kg/hr (05/09/20 0538)  . fentaNYL infusion INTRAVENOUS 150 mcg/hr (05/09/20 0537)  . norepinephrine    . piperacillin-tazobactam (ZOSYN)  IV      PRN Medications: Place/Maintain  arterial line **AND** sodium chloride, acetaminophen, albumin human, [DISCONTINUED] lidocaine **AND** albuterol, clonazePAM, guaiFENesin, hydrALAZINE, labetalol, lip balm, ondansetron (ZOFRAN) IV, oxyCODONE, phenol, polyethylene glycol, promethazine-codeine, simethicone, sodium chloride, sodium chloride flush   Assessment/Plan   1. Acute hypoxemic respiratory failure: Due to COVID-19 PNA with bilateral infiltrates.  Refractory hypoxemia, VV-ECMO cannulation on 03/09/2020 with improvement in oxygenation.  Developed left PTX post-subclavian CVL and had left chest tube, the left lung is re-expanded and CT out.  He was extubated 1/2 but reintubated 1/4 with agitation and suspected aspiration.  Tracheostomy 1/6.  CTA chest 1/22 with suspected chronic PEs and ARDS. ECMO cannula repositioned 1/5. ECMO circuit changed 1/26. Extubated to HFNC on 2/5. ECMO circuit changed and cannula repositioned 2/22.  LDH stable. Has struggled with hypercarbia from significant dead space ventilation. Had massive air entrainment overnight due to leakage at ECMO cannula insertion site. Cannula repositioned and resecured but still with some air entrainment with movement. Will follow. If has complete Neuro recovery will need to consider high-risk switching cannula to left subclavian site in near future. D/w Dr. Vickey SagesAtkins and CCM team. Back on vent due to hemodynamic instability - Wean vent slowly - Continue current ECMO support.  If has complete Neuro recovery will need to consider high-risk switching cannula to left subclavian site in near future. - Hold diuretic for now - Patient has had remdesivir, tocilizumab. - Completed steroid taper. - Continue bivalirudin, goal PTT 65-80.  PTT 61. Discussed dosing with PharmD personally. - Limit mobilization until cannula issue sorted out - Coreg + low dose diltiazem continues at 25 mg bid to increase fractional flow via ECMO circuit.  - Due to lack of insurance, lung transplant currently is  not an option. SW working on options. Will see if we can check PRA level to further evaluate transplant candidacy.  2. 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  Clot noted on TEE in RV as well.  TTE 1/2 showed normal EF 60-65%, RV improved (mildly dilated/dysfunctional). TEE on 1/5 with moderate to severe RV dysfunction but patient was hypoxemic.  Had 1/2 dose TPA on 1/5. Echo 1/20 with mildly dilated/mildly dysfunctional RV. CTA chest 1/22 with chronic-appearing PEs in upper lobes. - PTT 61. Bivalirudin for goal PTT 65-80. Personally reviewed 3. Left PTX: Left chest tube, lung is re-expanded. Tube now out, stable CXR. 4. Shock: Suspect septic/distributive. Resolved 5. Anemia: Hgb 7.8 transfuse < 7.5.  6. AKI: Resolved 7. Hyperglycemia: insulin.  8. HTN: Controlled.     - Continue Coreg 25 mg bid.   - Diltiazem 30 bid.  9. CHB: Episode of CHB when hypoxemic and with cough (suspect vagal).  NSR since then.   He has occasional short vagal episodes.  - Continue Coreg/diltiazem, watch rhythm.  10. Thrombocytopenia: Resolved  11. FEN: He is eating well, tube feeds off and Cortrack out.  12. Ischemic digits: LUE.  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  Repeat study 1/17 showed no obstruction.  - Wound Care following. 13. ID: Group F Strep and Enterococcus faecalis in sputum. Initially completed abx for these bacteria. Now with recurrent sepsis, Enterococcus faecalis in blood now. Vancomycin restarted and continued to 2/21. TEE 2/7 with no vegetation. Currently no abx.  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   - resolved 15. Hypernatremia: Resolved. Na 134  Continue free water as needed. No change 16. Depression: On sertraline 25 mg daily.   CRITICAL CARE Performed by: Arvilla Meres  Total critical care time:  60 minutes  Critical care time was exclusive of separately billable procedures and treating other patients.  Critical care was necessary to treat or prevent imminent or life-threatening deterioration.  Critical care was time spent personally by me on the following activities: development of treatment plan with patient and/or surrogate as well as nursing, discussions with consultants, evaluation of patient's response to treatment, examination of patient, obtaining history from patient or surrogate, ordering and performing treatments and interventions, ordering and review of laboratory studies, ordering and review of radiographic studies, pulse oximetry and re-evaluation of patient's condition.    Length of Stay: 57  Arvilla Meres, MD  05/09/2020, 6:10 AM  Advanced Heart Failure Team Pager [phone] (M-F; 7a - 4p)  Please contact [facility] for night-coverage after hours (4p -7a ) and weekends on [email or web address]

## 2020-05-09 NOTE — Progress Notes (Signed)
Critical Care Progress Note  NAME:  Alex [name], MRN:  [id number], DOB:  Jul 26, 1970, LOS: 61 ADMISSION DATE:  02/27/2020, CONSULTATION DATE: 02/13/2020 REFERRING MD: Wynona Neat -LBPCCM, CHIEF COMPLAINT: Respiratory failure requiring ECMO  HPI/course in hospital  50 year old man admitted to hospital 12/28 with 1 week history of dyspnea cough nausea and vomiting.  Initially admitted to [facility] long hospital and placed on high flow nasal cannula but rapidly failed and required intubation 12/29.  Persistent hypoxic respiratory failure with PF ratio 55 in spite of 18 of PEEP FiO2 0.1 despite paralytics.  Did not improve with prone ventilation  Cannulated for VV ECMO 12/30 via right IJ crescent cannula.  ECMO circuit was changed on 04/07/2020 Iatrogenic pneumothorax from left subclavian triple-lumen placement  12/28 admitted covid, ARDS 12/30 ECMO cannulation, iatrogenic pneumothrorax, DVT, Bivalrudin started           Left subclavian hematoma, chest tube, ceftriaxone/ azithromycin started 12/31 1Unit PRBC 1/1 Palliative consult, noted HTN, fentanyl only 1/2 Extubation I/O positive, left lung re-expanded, EF 60-65%, Lasix 20mg  BID 1/3 BiPAP in place, HTN to 190s, 200s, labetolol for BP, I/O even, cefepime and vancomycin started 1/4 agitation off BiPAP, possible aspiration 1/5 agitation overnight, reintubated      ECHO with RV dilation, ECMO cannula repositioned, LUE DVT (+), 1/2 dose tPA given,      I/O up, on lasix, Epi, NE, vasopressin started, HR/ rhythm issues noted 1/6 Tracheostomy, hypoglycemic when off tube feeds       Cortrack tune issues 1/7 on lasix gtt 4mg /hr with diuresis, agitation improved 1/8 starting steroid taper 1/9 severe agitation, heavy sedation required, lasix gtt 6mg /hr, I/Os (-)       precedex and fentanyl 1/10 Vanc stopped 1/11 sweep to 2, lasix gtt at 4mg /hr, weight up, metoprolol added for HTN 1/12 fevers to 99 noted, lasix gtt and metazolone 1/13 lasix gtt  dced 1/14 intermittent HTN, possibly flash pulmonary edema tied to sedation        Ischemic left hand changes, 1 Unit PRBCs 1/15 1/16 sweep from 4 to 2.5, chest tube removed 1/17 sweep at 6, trial of nebulized morphine for cough, LUE dopplers show no obstruction 1/18 two events of 2nd degree type II HB noted with coughing         sweep at 4, IV lasix 40, acetazolamide, distal XLT trach placement         agitation and BP spikes 1/19 agitation, air hunger, seroquel, klonopin, oxy, valproate, ketamine trial 1/20 sweep at 3.5, diuresis results in chugging, albumin given         1 Unit PRBCs, lidocaine nebulizers for cough, ancef started 1/21 sweep to 8, anxiety, I/Os (+) with lasix and acetazolamide 1/22 sweep at 5, chest CT bilateral upper lobe PEs 1/23 sweep at 5, delirium, I/Os even with lasix and acetazolamide         continued coughing, 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 1/24 sweep at 7.5, 1 Unit PRBCs, repeat bronchoscopy showed resolution of 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 1/25 sweep at 7.5, on propofol, lasix gtt at 54ml/hr, I/Os (+)         nebulized morphine and lidocaine for cough 1/26 sweep at 8, propofol/ precedex, cough present when weaned, lasix gtt at 52ml/hr        ECMO circuit changes, levophed started, 1 Unit PRBCs 1/27 sweep at 4, off sedation, delirium, I/Os even        lasix gtt restated, acetazolamide overnight 1/28 sweep at 7.5,  HTN labile 1/29 sweep of 6, patient reports a tickle in throat, cetacaine 1/30 sweep down to 6, cough improved with cetacaine and gabapentin 1/31 sweep at 5, agitation, off acetazolamide, lasix, weight up 2/1 cough, of pulmicort, cetacaine for cough, continued gabapentin, dexmethorphan       CT demonstrated trach placement against back of trachea 2/2 Trach collar trial somewhat effective in managing cough       low dose diltiazem for HR,  2/3 sweep at 4,trach cannula replacement (longer, more flexible bivona); brochoscopy  shows irritated mucosa       IV lasix and acetazolamide 2/4 sweep at 5, I/Os (+), BUN elevated, lasix gtt with diamox,       trach decannulation, increased gabapentin for cough 2/5 start HCAP coverage for rising WBC/temps, check Pct/cultures, CXR worse with lack of PEEP, diuresed well but started getting very hypotensive, sepsis vs. Too dry 04/18/20 blood cx grew E faecalis 2/7 PICC change, TEE 2/7>> sweep weans 2/22 Cannula adjustment, circuit change 2/25 up walking in the hallway with physical therapy 2/27 Air entrainment, air embolism   Past Medical History  none  Interim history/subjective:   Air entrainment with acute pulmonary air embolism last night.   Objective   Blood pressure 127/67, pulse 86, temperature (!) 101.8 F (38.8 C), temperature source Oral, resp. rate 12, height 5\' 4"  (1.626 m), weight 77 kg, SpO2 100 %.    Vent Mode: PRVC FiO2 (%):  [40 %-100 %] 40 % Set Rate:  [12 bmp] 12 bmp Vt Set:  [350 mL] 350 mL PEEP:  [5 cmH20] 5 cmH20 Plateau Pressure:  [30 cmH20-33 cmH20] 33 cmH20   Intake/Output Summary (Last 24 hours) at 05/09/2020 0739 Last data filed at 05/09/2020 0600 Gross per 24 hour  Intake 1985.37 ml  Output 4550 ml  Net -2564.63 ml   Filed Weights   05/06/20 0600 05/07/20 0700 05/08/20 0700  Weight: 79.4 kg 77.8 kg 77 kg    Examination: Constitutional: Shock state, intubated on mechanical life support, VV ECMO Eyes: Sedated  HEENT, prior trach site clear Cardiovascular: Regular rate rhythm, S1-S2 Respiratory: Bilateral ventilated breath sounds on mechanical life support Gastrointestinal: Nontender nondistended Skin: No rash Neurologic: Moves all 4 extremities Psychiatric: RASS 0 Ext: No significant edema   Labs reviewed  Chest x-ray 05/09/2020: Appropriate ECMO cannula reposition.  No pneumothorax bilateral infiltrates The patient's images have been independently reviewed by me.    Assessment & Plan:   Acute hypoxemic/hypercapnic  respiratory failure due to severe ARDS from COVID-19 pneumonia Probable acute PE, and RV dysfunction s/p TPA Refractory coughing- improved after trach removal but still an issue intermittently Strep group F/ enterococcal pneumonia- s/p 10 days vanc 1/29; recurrent with bacteremia on 2/5 now s/p vanc x 14 days R effusion- lymphocyte predominant transudate drained 2/16 05/09/2020: Acute air pulmonary embolism, air entrainment from around catheter insertion site Plan: Please see separate documentation regarding event occurrence. Continue full VV ECMO support Patient was unfortunately reintubated due to depressed GCS and shock state. Continue to plan for weaning sweep gas as tolerated May need to consider repositioning/replacement of ECMO cannula.  HTN.  Trials of reducing HR/BP have resulted in reduced pCO2 (likely related to fractional flow through ECMO circuit) Plan: Continue Coreg  Hyponatremia- Diuresis as needed   Acute delirium, with agitation- improved. Plan: IV sedation started PAD guidelines Fentanyl plus Precedex  Sepsis secondary to E faecalis bacteremia- secondary to PNA, has grown enterococcus in sputum prior, previous vanc course completed 1/29. TEE  on 2/7 without vegetations. PICC changed on 2/7.  14 day vanc therapy from PICC change completed 2/21. 05/09/2020 During periarrest event likely significant aspiration. Plan: Broad-spectrum antibiotics  Gastroparesis with some element ileus: improved but occasional vomiting spells with coughing attacks.  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  - wound care following, appreciate dressing recommendations Plan: Attempt to keep arterial line out of left arm  R arm swelling- duplex neg, NTD here, going to space out cuff measurements since we have arterial line  Hyperglycemia due to diabetes type II -Plan: Goal CBG  140-180  Acute urinary retention- recurrent issue Plan: Condom cath Continue Cardura Intermittent bladder scanning  Deconditioning Holding off on therapy at this time after reintubation  Destination of therapy-  Plan: VV ECMO with bridge to recovery  Daily Goals Checklist  Pain/Anxiety/Delirium protocol (if indicated): see above VAP protocol (if indicated):  n/a DVT prophylaxis: bivalirudin GI prophylaxis: pantoprazole Glucose control: basal bolus insulin Mobility/therapy needs: Mobilization as tolerated Code Status: Full code Disposition: ICU  This patient is critically ill with multiple organ system failure; which, requires frequent high complexity decision making, assessment, support, evaluation, and titration of therapies. This was completed through the application of advanced monitoring technologies and extensive interpretation of multiple databases. During this encounter critical care time was devoted to patient care services described in this note for 125 minutes.  (Includes documented time from previous progress note)  Josephine Igo, DO [facility] 05/09/2020 7:39 AM

## 2020-05-10 ENCOUNTER — Inpatient Hospital Stay (HOSPITAL_COMMUNITY): Payer: Medicaid Other

## 2020-05-10 ENCOUNTER — Inpatient Hospital Stay: Payer: Self-pay

## 2020-05-10 DIAGNOSIS — R0989 Other specified symptoms and signs involving the circulatory and respiratory systems: Secondary | ICD-10-CM

## 2020-05-10 LAB — CBC
HCT: 26.5 % — ABNORMAL LOW (ref 39.0–52.0)
HCT: 31.8 % — ABNORMAL LOW (ref 39.0–52.0)
Hemoglobin: 8.9 g/dL — ABNORMAL LOW (ref 13.0–17.0)
Hemoglobin: 10.2 g/dL — ABNORMAL LOW (ref 13.0–17.0)
MCH: 30.4 pg (ref 26.0–34.0)
MCH: 29 pg (ref 26.0–34.0)
MCHC: 33.6 g/dL (ref 30.0–36.0)
MCHC: 32.1 g/dL (ref 30.0–36.0)
MCV: 90.4 fL (ref 80.0–100.0)
MCV: 90.3 fL (ref 80.0–100.0)
Platelets: 183 10*3/uL (ref 150–400)
Platelets: 171 10*3/uL (ref 150–400)
RBC: 2.93 MIL/uL — ABNORMAL LOW (ref 4.22–5.81)
RBC: 3.52 MIL/uL — ABNORMAL LOW (ref 4.22–5.81)
RDW: 15.6 % — ABNORMAL HIGH (ref 11.5–15.5)
RDW: 17.6 % — ABNORMAL HIGH (ref 11.5–15.5)
WBC: 8.5 10*3/uL (ref 4.0–10.5)
WBC: 12.2 10*3/uL — ABNORMAL HIGH (ref 4.0–10.5)
nRBC: 0 % (ref 0.0–0.2)
nRBC: 0 % (ref 0.0–0.2)

## 2020-05-10 LAB — POCT I-STAT 7, (LYTES, BLD GAS, ICA,H+H)
Acid-Base Excess: 4 mmol/L — ABNORMAL HIGH (ref 0.0–2.0)
Acid-Base Excess: 4 mmol/L — ABNORMAL HIGH (ref 0.0–2.0)
Acid-Base Excess: 4 mmol/L — ABNORMAL HIGH (ref 0.0–2.0)
Acid-Base Excess: 2 mmol/L (ref 0.0–2.0)
Acid-Base Excess: 1 mmol/L (ref 0.0–2.0)
Acid-base deficit: 2 mmol/L (ref 0.0–2.0)
Acid-base deficit: 1 mmol/L (ref 0.0–2.0)
Bicarbonate: 31.6 mmol/L — ABNORMAL HIGH (ref 20.0–28.0)
Bicarbonate: 30.8 mmol/L — ABNORMAL HIGH (ref 20.0–28.0)
Bicarbonate: 30.8 mmol/L — ABNORMAL HIGH (ref 20.0–28.0)
Bicarbonate: 28.8 mmol/L — ABNORMAL HIGH (ref 20.0–28.0)
Bicarbonate: 29.4 mmol/L — ABNORMAL HIGH (ref 20.0–28.0)
Bicarbonate: 27.5 mmol/L (ref 20.0–28.0)
Bicarbonate: 26.6 mmol/L (ref 20.0–28.0)
Calcium, Ion: 1.19 mmol/L (ref 1.15–1.40)
Calcium, Ion: 1.19 mmol/L (ref 1.15–1.40)
Calcium, Ion: 1.2 mmol/L (ref 1.15–1.40)
Calcium, Ion: 1.2 mmol/L (ref 1.15–1.40)
Calcium, Ion: 1.19 mmol/L (ref 1.15–1.40)
Calcium, Ion: 1.19 mmol/L (ref 1.15–1.40)
Calcium, Ion: 1.15 mmol/L (ref 1.15–1.40)
HCT: 26 % — ABNORMAL LOW (ref 39.0–52.0)
HCT: 26 % — ABNORMAL LOW (ref 39.0–52.0)
HCT: 25 % — ABNORMAL LOW (ref 39.0–52.0)
HCT: 26 % — ABNORMAL LOW (ref 39.0–52.0)
HCT: 30 % — ABNORMAL LOW (ref 39.0–52.0)
HCT: 43 % (ref 39.0–52.0)
HCT: 27 % — ABNORMAL LOW (ref 39.0–52.0)
Hemoglobin: 8.8 g/dL — ABNORMAL LOW (ref 13.0–17.0)
Hemoglobin: 8.8 g/dL — ABNORMAL LOW (ref 13.0–17.0)
Hemoglobin: 8.5 g/dL — ABNORMAL LOW (ref 13.0–17.0)
Hemoglobin: 8.8 g/dL — ABNORMAL LOW (ref 13.0–17.0)
Hemoglobin: 10.2 g/dL — ABNORMAL LOW (ref 13.0–17.0)
Hemoglobin: 14.6 g/dL (ref 13.0–17.0)
Hemoglobin: 9.2 g/dL — ABNORMAL LOW (ref 13.0–17.0)
O2 Saturation: 99 %
O2 Saturation: 100 %
O2 Saturation: 100 %
O2 Saturation: 86 %
O2 Saturation: 98 %
O2 Saturation: 98 %
O2 Saturation: 92 %
Patient temperature: 37.1
Patient temperature: 37
Patient temperature: 36.8
Patient temperature: 98.4
Patient temperature: 96.9
Patient temperature: 100
Patient temperature: 100
Potassium: 4 mmol/L (ref 3.5–5.1)
Potassium: 3.8 mmol/L (ref 3.5–5.1)
Potassium: 4.2 mmol/L (ref 3.5–5.1)
Potassium: 4.1 mmol/L (ref 3.5–5.1)
Potassium: 4.4 mmol/L (ref 3.5–5.1)
Potassium: 4.4 mmol/L (ref 3.5–5.1)
Potassium: 4.1 mmol/L (ref 3.5–5.1)
Sodium: 132 mmol/L — ABNORMAL LOW (ref 135–145)
Sodium: 132 mmol/L — ABNORMAL LOW (ref 135–145)
Sodium: 131 mmol/L — ABNORMAL LOW (ref 135–145)
Sodium: 131 mmol/L — ABNORMAL LOW (ref 135–145)
Sodium: 132 mmol/L — ABNORMAL LOW (ref 135–145)
Sodium: 131 mmol/L — ABNORMAL LOW (ref 135–145)
Sodium: 131 mmol/L — ABNORMAL LOW (ref 135–145)
TCO2: 34 mmol/L — ABNORMAL HIGH (ref 22–32)
TCO2: 32 mmol/L (ref 22–32)
TCO2: 33 mmol/L — ABNORMAL HIGH (ref 22–32)
TCO2: 31 mmol/L (ref 22–32)
TCO2: 31 mmol/L (ref 22–32)
TCO2: 30 mmol/L (ref 22–32)
TCO2: 28 mmol/L (ref 22–32)
pCO2 arterial: 67.9 mmHg (ref 32.0–48.0)
pCO2 arterial: 55.6 mmHg — ABNORMAL HIGH (ref 32.0–48.0)
pCO2 arterial: 58.9 mmHg — ABNORMAL HIGH (ref 32.0–48.0)
pCO2 arterial: 58.8 mmHg — ABNORMAL HIGH (ref 32.0–48.0)
pCO2 arterial: 62.9 mmHg — ABNORMAL HIGH (ref 32.0–48.0)
pCO2 arterial: 72.4 mmHg (ref 32.0–48.0)
pCO2 arterial: 61.8 mmHg — ABNORMAL HIGH (ref 32.0–48.0)
pH, Arterial: 7.276 — ABNORMAL LOW (ref 7.350–7.450)
pH, Arterial: 7.352 (ref 7.350–7.450)
pH, Arterial: 7.326 — ABNORMAL LOW (ref 7.350–7.450)
pH, Arterial: 7.298 — ABNORMAL LOW (ref 7.350–7.450)
pH, Arterial: 7.272 — ABNORMAL LOW (ref 7.350–7.450)
pH, Arterial: 7.191 — CL (ref 7.350–7.450)
pH, Arterial: 7.247 — ABNORMAL LOW (ref 7.350–7.450)
pO2, Arterial: 192 mmHg — ABNORMAL HIGH (ref 83.0–108.0)
pO2, Arterial: 200 mmHg — ABNORMAL HIGH (ref 83.0–108.0)
pO2, Arterial: 261 mmHg — ABNORMAL HIGH (ref 83.0–108.0)
pO2, Arterial: 59 mmHg — ABNORMAL LOW (ref 83.0–108.0)
pO2, Arterial: 124 mmHg — ABNORMAL HIGH (ref 83.0–108.0)
pO2, Arterial: 138 mmHg — ABNORMAL HIGH (ref 83.0–108.0)
pO2, Arterial: 78 mmHg — ABNORMAL LOW (ref 83.0–108.0)

## 2020-05-10 LAB — GLUCOSE, CAPILLARY
Glucose-Capillary: 174 mg/dL — ABNORMAL HIGH (ref 70–99)
Glucose-Capillary: 174 mg/dL — ABNORMAL HIGH (ref 70–99)
Glucose-Capillary: 170 mg/dL — ABNORMAL HIGH (ref 70–99)
Glucose-Capillary: 97 mg/dL (ref 70–99)
Glucose-Capillary: 98 mg/dL (ref 70–99)

## 2020-05-10 LAB — TYPE AND SCREEN
ABO/RH(D): B POS
Antibody Screen: NEGATIVE

## 2020-05-10 LAB — BASIC METABOLIC PANEL
Anion gap: 10 (ref 5–15)
Anion gap: 10 (ref 5–15)
BUN: 25 mg/dL — ABNORMAL HIGH (ref 6–20)
BUN: 29 mg/dL — ABNORMAL HIGH (ref 6–20)
CO2: 27 mmol/L (ref 22–32)
CO2: 27 mmol/L (ref 22–32)
Calcium: 8.2 mg/dL — ABNORMAL LOW (ref 8.9–10.3)
Calcium: 8.1 mg/dL — ABNORMAL LOW (ref 8.9–10.3)
Chloride: 94 mmol/L — ABNORMAL LOW (ref 98–111)
Chloride: 96 mmol/L — ABNORMAL LOW (ref 98–111)
Creatinine, Ser: 0.88 mg/dL (ref 0.61–1.24)
Creatinine, Ser: 1.06 mg/dL (ref 0.61–1.24)
GFR, Estimated: >60 mL/min (ref >60)
GFR, Estimated: >60 mL/min (ref >60)
Glucose, Bld: 177 mg/dL — ABNORMAL HIGH (ref 70–99)
Glucose, Bld: 96 mg/dL (ref 70–99)
Potassium: 3.8 mmol/L (ref 3.5–5.1)
Potassium: 4.2 mmol/L (ref 3.5–5.1)
Sodium: 131 mmol/L — ABNORMAL LOW (ref 135–145)
Sodium: 133 mmol/L — ABNORMAL LOW (ref 135–145)

## 2020-05-10 LAB — FIBRINOGEN: Fibrinogen: 565 mg/dL — ABNORMAL HIGH (ref 210–475)

## 2020-05-10 LAB — PROTIME-INR
INR: 1.9 — ABNORMAL HIGH (ref 0.8–1.2)
Prothrombin Time: 21.1 seconds — ABNORMAL HIGH (ref 11.4–15.2)

## 2020-05-10 LAB — LACTATE DEHYDROGENASE: LDH: 283 U/L — ABNORMAL HIGH (ref 98–192)

## 2020-05-10 LAB — PREPARE RBC (CROSSMATCH)

## 2020-05-10 LAB — APTT
aPTT: 76 seconds — ABNORMAL HIGH (ref 24–36)
aPTT: 82 seconds — ABNORMAL HIGH (ref 24–36)
aPTT: 87 seconds — ABNORMAL HIGH (ref 24–36)

## 2020-05-10 LAB — LACTIC ACID, PLASMA
Lactic Acid, Venous: 0.5 mmol/L (ref 0.5–1.9)
Lactic Acid, Venous: 0.6 mmol/L (ref 0.5–1.9)

## 2020-05-10 MED ORDER — SODIUM CHLORIDE 0.9% FLUSH
10.0000 mL | Freq: Two times a day (BID) | INTRAVENOUS | Status: DC
  Administered 2020-05-10: 20 mL
  Administered 2020-05-11: 10 mL

## 2020-05-10 MED ORDER — SODIUM CHLORIDE 0.9% FLUSH: 10.0000 mL | INTRAVENOUS | Status: DC | PRN

## 2020-05-10 MED ORDER — SODIUM CHLORIDE 0.9% IV SOLUTION: Freq: Once | INTRAVENOUS | Status: AC

## 2020-05-10 MED ORDER — SODIUM CHLORIDE 0.9 % IV SOLN
0.0550 mg/kg/h | INTRAVENOUS | Status: DC
  Administered 2020-05-10 (×2): 0.08 mg/kg/h via INTRAVENOUS
  Administered 2020-05-11: 0.065 mg/kg/h via INTRAVENOUS
  Administered 2020-05-12: 0.055 mg/kg/h via INTRAVENOUS
  Filled 2020-05-10 (×4): qty 250

## 2020-05-10 MED ORDER — LIDOCAINE HCL (PF) 1 % IJ SOLN
INTRAMUSCULAR | Status: AC
  Filled 2020-05-10: qty 30

## 2020-05-10 MED ORDER — PIPERACILLIN-TAZOBACTAM 3.375 G IVPB
3.3750 g | Freq: Three times a day (TID) | INTRAVENOUS | Status: DC
  Administered 2020-05-10 – 2020-05-12 (×5): 3.375 g via INTRAVENOUS
  Filled 2020-05-10 (×3): qty 50

## 2020-05-10 MED ORDER — MIDAZOLAM HCL 2 MG/2ML IJ SOLN
2.0000 mg | Freq: Once | INTRAMUSCULAR | Status: AC
  Administered 2020-05-10: 2 mg via INTRAVENOUS

## 2020-05-10 MED ORDER — PIVOT 1.5 CAL PO LIQD
1000.0000 mL | ORAL | Status: DC
  Administered 2020-05-10 – 2020-05-12 (×2): 1000 mL

## 2020-05-10 MED ORDER — POTASSIUM CHLORIDE 20 MEQ PO PACK
40.0000 meq | PACK | Freq: Once | ORAL | Status: AC
  Administered 2020-05-10: 40 meq
  Filled 2020-05-10: qty 2

## 2020-05-10 NOTE — Progress Notes (Signed)
Patient ID: Deegan Valentino, male   DOB: 03/22/1970, 50 y.o.   MRN: [id number]  This NP visited patient at the bedside as a follow up for palliative medcien needs and emotional support.  Medical records reviewed.  Discussed with treatment team  49 y.o.malewith no significantpast medical history admitted on12/28/2021with dyspnea, cough, nausea/vomiting ~ 1 week ago with worsening symptoms of body aches and fatigue and +COVID 02/07/20 and admitted with shortness of breath.Required intubation 03/10/20. Cannulated for VV ECMO 04-02-2020.  Today is day 75 of this hospitalization.  Patient is  independent from  ventilator, remains on ECMO.      Patient is critically ill and making  slow prognosis, he continues to work with therapies    He remains  high risk for decompensation 2/2 to complex medical situation  Noted events of the past 48 hours.  Re-intubated  05-10-20  ECMO decannulation   I spoke to Walter/ main local support person,  by telephone.  Education offered on the current medical situation, he understands the seriousness.   Patient is high risk for decompensation.    Conversations had regarding the what ifs,  I encouraged Zollie Beckers to discuss with [name]/ patient's son  his thoughts on desire to  visit with his father.  Son is out of state attending school.   Zollie Beckers assures me he is in touch and updating patient's son and brother.   Questions and concerns addressed   Discussed with bedside RN         PMT will continue to support holistically  Total time spent on the unit was 25 minutes  Greater than 50% of the time was spent in counseling and coordination of care  Lorinda Creed NP  Palliative Medicine Team Team Phone # [phone] Pager [phone]

## 2020-05-10 NOTE — Procedures (Signed)
Cortrak  Person Inserting Tube:  Renie Ora, RD Tube Type:  Cortrak - 43 inches Tube Location:  Right nare Initial Placement:  Postpyloric Secured by: Bridle Technique Used to Measure Tube Placement:  Documented cm marking at nare/ corner of mouth Cortrak Secured At:  93 cm Procedure Comments:  Cortrak Tube Team Note:  Consult received to place a Cortrak feeding tube.   X-ray is required, abdominal x-ray has been ordered by the Cortrak team. Please confirm tube placement before using the Cortrak tube.   If the tube becomes dislodged please keep the tube and contact the Cortrak team at [email or web address] ([email or web address]) for replacement.  If after hours and replacement cannot be delayed, place a NG tube and confirm placement with an abdominal x-ray.      Trenton Gammon, MS, RD, LDN, CNSC Inpatient Clinical Dietitian RD pager # available in AMION  After hours/weekend pager # available in [facility]

## 2020-05-10 NOTE — Progress Notes (Addendum)
ANTICOAGULATION CONSULT NOTE  Pharmacy Consult for bivalirudin Indication: ECMO and VTE  Labs: Recent Labs    05/08/20 0343 05/08/20 0636 05/09/20 0140 05/09/20 0410 05/09/20 0422 05/09/20 1651 05/09/20 1652 05/09/20 2342 05/09/20 2344 05/10/20 0119 05/10/20 0337 05/10/20 0340  HGB 8.3*   < > 8.7*  --    < > 9.2*   < >  --    < > 8.8* 8.9* 8.8*  HCT 25.9*   < > 25.4*  --    < > 27.6*   < >  --    < > 26.0* 26.5* 26.0*  PLT 257   < > 238  --   --  191  --   --   --   --  183  --   APTT 65*   < >  --  61*  --  87*  --  82*  --   --  76*  --   LABPROT 17.6*  --   --  18.8*  --   --   --   --   --   --  21.1*  --   INR 1.5*  --   --  1.6*  --   --   --   --   --   --  1.9*  --   CREATININE 0.77   < >  --  0.95  --  0.77  --   --   --   --  0.88  --    < > = values in this interval not displayed.    Assessment: 86 yoM admitted with COVID-19 PNA with worsening hypoxia, s/p cannulation for ECMO. Pt was started on IV heparin prior to cannulation due to acute DVTs and possible PE, transitioned to bivalirudin with ECMO. S/p tPA on 1/5 and tracheostomy on 1/6. ECMO circuit changed on 05/03/2020  APTT therapeutic this morning, CBC stable.  Goal of Therapy:  aPTT 60-80 seconds   Plan:  -Continue bivalirudin 0.08 mg/kg/hr -q12h coags   ADDENDUM: Pt decanulated from ECMO, continuing anticoagulation given DVTs noted on dopplers. Will restart anticoagulation 4h post/op per CVTS. Will continue bivalirudin for now for anticoagulation given heparin shortage.  Plan: Resume bivalirudin 0.08 mg/kg/hr at 1600 Check aPTT 6h after restart   Fredonia Highland, PharmD, BCPS, [facility] Clinical Pharmacist [phone] Please check AMION for all [facility] Pharmacy numbers 05/10/2020

## 2020-05-10 NOTE — Progress Notes (Signed)
Critical Care Progress Note  NAME:  Alex [name], MRN:  [id number], DOB:  [birth year], LOS: 62 ADMISSION DATE:  03/01/2020, CONSULTATION DATE: 03/10/2020 REFERRING MD: Wynona Neat -LBPCCM, CHIEF COMPLAINT: Respiratory failure requiring ECMO  HPI/course in hospital  50 year old man admitted to hospital 12/28 with 1 week history of dyspnea cough nausea and vomiting.  Initially admitted to [facility] long hospital and placed on high flow nasal cannula but rapidly failed and required intubation 12/29.  Persistent hypoxic respiratory failure with PF ratio 55 in spite of 18 of PEEP FiO2 0.1 despite paralytics.  Did not improve with prone ventilation  Cannulated for VV ECMO 12/30 via right IJ crescent cannula.  ECMO circuit was changed on 04/07/2020 Iatrogenic pneumothorax from left subclavian triple-lumen placement  12/28 admitted covid, ARDS 12/30 ECMO cannulation, iatrogenic pneumothrorax, DVT, Bivalrudin started           Left subclavian hematoma, chest tube, ceftriaxone/ azithromycin started 12/31 1Unit PRBC 1/1 Palliative consult, noted HTN, fentanyl only 1/2 Extubation I/O positive, left lung re-expanded, EF 60-65%, Lasix 20mg  BID 1/3 BiPAP in place, HTN to 190s, 200s, labetolol for BP, I/O even, cefepime and vancomycin started 1/4 agitation off BiPAP, possible aspiration 1/5 agitation overnight, reintubated      ECHO with RV dilation, ECMO cannula repositioned, LUE DVT (+), 1/2 dose tPA given,      I/O up, on lasix, Epi, NE, vasopressin started, HR/ rhythm issues noted 1/6 Tracheostomy, hypoglycemic when off tube feeds       Cortrack tune issues 1/7 on lasix gtt 4mg /hr with diuresis, agitation improved 1/8 starting steroid taper 1/9 severe agitation, heavy sedation required, lasix gtt 6mg /hr, I/Os (-)       precedex and fentanyl 1/10 Vanc stopped 1/11 sweep to 2, lasix gtt at 4mg /hr, weight up, metoprolol added for HTN 1/12 fevers to 99 noted, lasix gtt and metazolone 1/13 lasix gtt  dced 1/14 intermittent HTN, possibly flash pulmonary edema tied to sedation        Ischemic left hand changes, 1 Unit PRBCs 1/15 1/16 sweep from 4 to 2.5, chest tube removed 1/17 sweep at 6, trial of nebulized morphine for cough, LUE dopplers show no obstruction 1/18 two events of 2nd degree type II HB noted with coughing         sweep at 4, IV lasix 40, acetazolamide, distal XLT trach placement         agitation and BP spikes 1/19 agitation, air hunger, seroquel, klonopin, oxy, valproate, ketamine trial 1/20 sweep at 3.5, diuresis results in chugging, albumin given         1 Unit PRBCs, lidocaine nebulizers for cough, ancef started 1/21 sweep to 8, anxiety, I/Os (+) with lasix and acetazolamide 1/22 sweep at 5, chest CT bilateral upper lobe PEs 1/23 sweep at 5, delirium, I/Os even with lasix and acetazolamide         continued coughing, 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 1/24 sweep at 7.5, 1 Unit PRBCs, repeat bronchoscopy showed resolution of 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 1/25 sweep at 7.5, on propofol, lasix gtt at 62ml/hr, I/Os (+)         nebulized morphine and lidocaine for cough 1/26 sweep at 8, propofol/ precedex, cough present when weaned, lasix gtt at 93ml/hr        ECMO circuit changes, levophed started, 1 Unit PRBCs 1/27 sweep at 4, off sedation, delirium, I/Os even        lasix gtt restated, acetazolamide overnight 1/28 sweep at 7.5,  HTN labile 1/29 sweep of 6, patient reports a tickle in throat, cetacaine 1/30 sweep down to 6, cough improved with cetacaine and gabapentin 1/31 sweep at 5, agitation, off acetazolamide, lasix, weight up 2/1 cough, of pulmicort, cetacaine for cough, continued gabapentin, dexmethorphan       CT demonstrated trach placement against back of trachea 2/2 Trach collar trial somewhat effective in managing cough       low dose diltiazem for HR,  2/3 sweep at 4,trach cannula replacement (longer, more flexible bivona); brochoscopy  shows irritated mucosa       IV lasix and acetazolamide 2/4 sweep at 5, I/Os (+), BUN elevated, lasix gtt with diamox,       trach decannulation, increased gabapentin for cough 2/5 start HCAP coverage for rising WBC/temps, check Pct/cultures, CXR worse with lack of PEEP, diuresed well but started getting very hypotensive, sepsis vs. Too dry 04/18/20 blood cx grew E faecalis 2/7 PICC change, TEE 2/7>> sweep weans 2/22 Cannula adjustment, circuit change 2/25 up walking in the hallway with physical therapy 2/27 Air entrainment, air embolism  2/28 sweep trial on 0 , planning for decannulation   Past Medical History  none  Interim history/subjective:   Plan for sweep trial on 0 sleep  Objective   Blood pressure 113/85, pulse 81, temperature 98.2 F (36.8 C), temperature source Core, resp. rate 14, height 5\' 4"  (1.626 m), weight 77 kg, SpO2 100 %.    Vent Mode: PSV;CPAP FiO2 (%):  [40 %-100 %] 100 % Set Rate:  [12 bmp] 12 bmp Vt Set:  [350 mL] 350 mL PEEP:  [5 cmH20-10 cmH20] 10 cmH20 Pressure Support:  [15 cmH20] 15 cmH20 Plateau Pressure:  [17 cmH20-33 cmH20] 17 cmH20   Intake/Output Summary (Last 24 hours) at 05/10/2020 0854 Last data filed at 05/10/2020 0800 Gross per 24 hour  Intake 3541.42 ml  Output 2155 ml  Net 1386.42 ml   Filed Weights   05/06/20 0600 05/07/20 0700 05/08/20 0700  Weight: 79.4 kg 77.8 kg 77 kg    Examination: Constitutional: Remains intubated mechanical life support, VV ECMO Eyes: Sedated  HEENT, prior trach site CDI Cardiovascular: Regular rate rhythm, S1-S2 Respiratory: Bilateral mechanically ventilated breath sounds Gastrointestinal: Nontender nondistended Skin: No rash Neurologic: Withdraws to pain, Psychiatric: RASS 0 Ext: No significant edema  Labs reviewed  Chest x-ray 05/10/2020: Appropriate cannula position bilateral infiltrates. The patient's images have been independently reviewed by me.    Assessment & Plan:   Acute  hypoxemic/hypercapnic respiratory failure due to severe ARDS from COVID-19 pneumonia Probable acute PE, and RV dysfunction s/p TPA Refractory coughing- improved after trach removal but still an issue intermittently Strep group F/ enterococcal pneumonia- s/p 10 days vanc 1/29; recurrent with bacteremia on 2/5 now s/p vanc x 14 days R effusion- lymphocyte predominant transudate drained 2/16 05/09/2020: Acute air pulmonary embolism, air entrainment from around catheter insertion site Plan: Plan Place patient in pressure support ventilation. 0 sweep trial Repeat arterial blood gas in 2 hours. If arterial blood gas is reassuring we will make plans for VV ECMO decannulation. We will arrange with thoracic surgery and vascular surgery if needed for closure of this site.  HTN.  Trials of reducing HR/BP have resulted in reduced pCO2 (likely related to fractional flow through ECMO circuit) Plan: Holding Coreg due to hypotension  Hyponatremia- Diuresis as needed  Acute delirium, with agitation- improved. Plan: Decrease IV sedation   Sepsis secondary to E faecalis bacteremia- secondary to PNA, has grown enterococcus in  sputum prior, previous vanc course completed 1/29. TEE on 2/7 without vegetations. PICC changed on 2/7.  14 day vanc therapy from PICC change completed 2/21. 05/09/2020 During periarrest event likely significant aspiration. Plan: Broad-spectrum antibiotics  Gastroparesis with some element ileus: improved but occasional vomiting spells with coughing attacks.  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  - wound care following, appreciate dressing recommendations Plan: Avoid arterial line in left arm  R arm swelling- duplex neg, NTD here, going to space out cuff measurements since we have arterial line  Hyperglycemia due to diabetes type II -Plan: Goal CBG  140-180  Acute urinary retention- recurrent issue Plan: Foley was replaced yesterday  Deconditioning Holding at this time for PT OT  Destination of therapy-  Plan: Possible decannulation from VV ECMO today  Daily Goals Checklist  Pain/Anxiety/Delirium protocol (if indicated): see above VAP protocol (if indicated):  n/a DVT prophylaxis: bivalirudin GI prophylaxis: pantoprazole Glucose control: basal bolus insulin Mobility/therapy needs: Mobilization as tolerated Code Status: Full code Disposition: ICU  This patient is critically ill with multiple organ system failure; which, requires frequent high complexity decision making, assessment, support, evaluation, and titration of therapies. This was completed through the application of advanced monitoring technologies and extensive interpretation of multiple databases. During this encounter critical care time was devoted to patient care services described in this note for 32 minutes.  Josephine Igo, DO [facility] 05/10/2020 8:54 AM

## 2020-05-10 NOTE — Progress Notes (Signed)
Pt transported to CT and back w/o complications.

## 2020-05-10 NOTE — Progress Notes (Signed)
Inpatient Diabetes Program Recommendations  AACE/ADA: New Consensus Statement on Inpatient Glycemic Control (2015)  Target Ranges:  Prepandial:   less than 140 mg/dL      Peak postprandial:   less than 180 mg/dL (1-2 hours)      Critically ill patients:  140 - 180 mg/dL   Lab Results  Component Value Date   GLUCAP 174 (H) 05/10/2020   HGBA1C 11.8 (H) 02/26/2020    Review of Glycemic Control Results for Alex [name], Alex [name] (MRN [id number]) as of 05/10/2020 09:36  Ref. Range 05/09/2020 20:28 05/09/2020 23:42 05/10/2020 03:37 05/10/2020 06:39  Glucose-Capillary Latest Ref Range: 70 - 99 mg/dL 883 (H) 374 (H) 451 (H) 174 (H)   Diabetes history: Type 2 Dm Current orders for Inpatient glycemic control: Novolog 0-20 units Q4H  Inpatient Diabetes Program Recommendations:    Consider adding back portion of basal: Levemir 10 units BID.   Thanks, Lujean Rave, MSN, RNC-[facility] Diabetes Coordinator [phone] (8a-5p)

## 2020-05-10 NOTE — Progress Notes (Signed)
Patient ID: Alex [name], male   DOB: 1971/01/05, 50 y.o.   MRN: [id number]     Advanced Heart Failure Rounding Note  PCP-Cardiologist: No primary care provider on file.   Subjective:    - 12/30: VV ECMO cannulation - 12/31: Left chest tube replaced - 1/2: Extubated. Echo with EF 60-65%, mildly dilated RV with mildly decreased systolic function.  - 1/4: Agitated, suspected aspiration.  Re-intubated.  - 1/5: ECMO cannula repositioned under TEE guidance. TEE showed moderately dilated/moderate-severely dysfunctional RV in setting of hypoxemia. LUE DVT found.  Patient got 1/2 dose of TPA due to initial concern for large PE.  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  - 1/6: Tracheostomy - 1/7: Echo with mild RV dilation/mild RV dysfunction.  - 1/16: Left chest tube out - 1/17: LUE arterial dopplers repeated, showed no obstruction.  - 1/20: Echo with EF 65-70%, mildly D-shaped septum, mildly dilated and mildly dysfunctional RV.  - 1/22: CTA chest: Bilateral upper lobe PEs (suspect chronic), changes c/w ARDS - 1/23: Bronchoscopy showed semi-occlusive ?mass/polyp in the trachea.  - 1/24: Bronchoscopy showed resolution of 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 - 1/26: ECMO circuit changed.  - 2/1: CT chest showed diffuse bronchiectasis as well as diffuse opacity consistent with COVID-19 PNA with ARDS. - 2/3: Trach exchange - 2/5: Decannulated to HFNC - 2/6: Enterococcal PNA/bacteremia.  Echo showed EF 65-70%, normal-appearing RV, no vegetation noted.  - 2/7: TEE with no vegetation, EF 55-60%, RV low normal function with normal size.  - 2/16: Right thoracentesis 500 cc - 2/22: ECMO circuit changed.  ECMO cannula repositioned under TEE guidance. TEE with EF 60-65%, normal RV.  - 2/27 Massive air entrainment due to leakage at cannula insertion site -> cannula repositioning & resuturing  - 2/27 Reintubated  Had massive air entrainment yesterday due to leakage at cannula insertion site. Near  arrest. Re-intubated. Cannula repositioned and resutured. TEE confirmed position.  Overnight, with movement had further air entrainment.  Now sedated and on full vent support.   He remains on NE 1.  I/Os + yesterday.    ECMO parameters: 3000 rpm Flow 4.3  L/min Pvenous -41 Delta P 19 Sweep 2 Vent 40%  ABG 7.35/56/200/100% LDH 329 => 301 => 309 => 291 => 283 PTT 76 Lactate 0.5 Hgb 8.9  Objective:   Weight Range: 77 kg Body mass index is 29.14 kg/m.   Vital Signs:   Temp:  [98.4 F (36.9 C)-101.1 F (38.4 C)] 98.6 F (37 C) (02/28 0400) Pulse Rate:  [75-125] 76 (02/28 0753) Resp:  [8-25] 13 (02/28 0753) BP: (91-158)/(59-124) 97/70 (02/28 0700) SpO2:  [97 %-100 %] 100 % (02/28 0753) Arterial Line BP: (96-257)/(51-99) 109/61 (02/28 0700) FiO2 (%):  [40 %-100 %] 100 % (02/28 0753) Last BM Date: 05/07/20  Weight change: Filed Weights   05/06/20 0600 05/07/20 0700 05/08/20 0700  Weight: 79.4 kg 77.8 kg 77 kg    Intake/Output:   Intake/Output Summary (Last 24 hours) at 05/10/2020 0805 Last data filed at 05/10/2020 0800 Gross per 24 hour  Intake 3377.84 ml  Output 2155 ml  Net 1222.84 ml      Physical Exam    General: Intubated/sedated.  Neck: No JVD, no thyromegaly or thyroid nodule.  Lungs: Distant BS. CV: Nondisplaced PMI.  Heart regular S1/S2, no S3/S4, no murmur.  1+ edema 1/2 to knees.  Abdomen: Soft, nontender, no hepatosplenomegaly, no distention.  Skin: Intact without lesions or rashes.  Neurologic: Sedated on vent.  Extremities: No clubbing or cyanosis. Dry gangrene digits left hand.  HEENT: Normal.    Telemetry   Sinus 90s. Personally reviewed  Labs    CBC Recent Labs    05/09/20 1651 05/09/20 1652 05/10/20 0337 05/10/20 0340  WBC 7.6  --  8.5  --   HGB 9.2*   < > 8.9* 8.8*  HCT 27.6*   < > 26.5* 26.0*  MCV 89.6  --  90.4  --   PLT 191  --  183  --    < > = values in this interval not displayed.   Basic Metabolic Panel Recent  Labs    05/09/20 1651 05/09/20 1652 05/10/20 0337 05/10/20 0340  NA 133*   < > 131* 132*  K 4.3   < > 3.8 3.8  CL 95*  --  94*  --   CO2 27  --  27  --   GLUCOSE 70  --  177*  --   BUN 24*  --  25*  --   CREATININE 0.77  --  0.88  --   CALCIUM 8.5*  --  8.2*  --    < > = values in this interval not displayed.   Liver Function Tests No results for input(s): AST, ALT, ALKPHOS, BILITOT, PROT, ALBUMIN in the last 72 hours. No results for input(s): LIPASE, AMYLASE in the last 72 hours. Cardiac Enzymes No results for input(s): CKTOTAL, CKMB, CKMBINDEX, TROPONINI in the last 72 hours.  BNP: BNP (last 3 results) No results for input(s): BNP in the last 8760 hours.  ProBNP (last 3 results) No results for input(s): PROBNP in the last 8760 hours.   D-Dimer No results for input(s): DDIMER in the last 72 hours. Hemoglobin A1C No results for input(s): HGBA1C in the last 72 hours. Fasting Lipid Panel No results for input(s): CHOL, HDL, LDLCALC, TRIG, CHOLHDL, LDLDIRECT in the last 72 hours. Thyroid Function Tests No results for input(s): TSH, T4TOTAL, T3FREE, THYROIDAB in the last 72 hours.  Invalid input(s): FREET3  Other results:   Imaging    No results found.   Medications:     Scheduled Medications: . sodium chloride   Intravenous Once  . acetaZOLAMIDE  250 mg Per Tube BID  . bethanechol  10 mg Per Tube TID  . chlorhexidine gluconate (MEDLINE KIT)  15 mL Mouth Rinse BID  . Chlorhexidine Gluconate Cloth  6 each Topical Daily  . chlorpheniramine-HYDROcodone  5 mL Per Tube Q12H  . clonazePAM  0.5 mg Per Tube Daily  . clonazePAM  1 mg Per Tube QHS  . dextromethorphan  30 mg Per Tube TID  . docusate  100 mg Per Tube BID  . furosemide  40 mg Intravenous Q8H  . gabapentin  300 mg Per Tube Q12H  . insulin aspart  0-20 Units Subcutaneous Q4H  . ipratropium-albuterol  3 mL Nebulization BID  . mouth rinse  15 mL Mouth Rinse 10 times per day  . melatonin  5 mg Per Tube  QHS  . metoCLOPramide (REGLAN) injection  10 mg Intravenous Q8H  . multivitamin  15 mL Per Tube Daily  . pantoprazole sodium  40 mg Per Tube QHS  . QUEtiapine  50 mg Per Tube QHS  . sennosides  5 mL Per Tube QHS  . sertraline  25 mg Per Tube Daily  . sodium chloride flush  10-40 mL Intracatheter Q12H    Infusions: . sodium chloride    . sodium chloride Stopped (04/29/20  1611)  . albumin human Stopped (04/23/20 0041)  . bivalirudin (ANGIOMAX) infusion 0.5 mg/mL (Non-ACS indications) 0.08 mg/kg/hr (05/10/20 0700)  . dexmedetomidine (PRECEDEX) IV infusion 1.2 mcg/kg/hr (05/10/20 0700)  . dextrose 50 mL/hr at 05/10/20 0700  . fentaNYL infusion INTRAVENOUS 200 mcg/hr (05/10/20 0700)  . norepinephrine (LEVOPHED) Adult infusion 1 mcg/min (05/10/20 0700)  . piperacillin-tazobactam Stopped (05/10/20 0538)  . vancomycin Stopped (05/10/20 0037)    PRN Medications: Place/Maintain arterial line **AND** sodium chloride, acetaminophen (TYLENOL) oral liquid 160 mg/5 mL, albumin human, [DISCONTINUED] lidocaine **AND** albuterol, clonazePAM, guaiFENesin, hydrALAZINE, labetalol, lip balm, ondansetron (ZOFRAN) IV, oxyCODONE, phenol, polyethylene glycol, promethazine-codeine, simethicone, sodium chloride, sodium chloride flush   Assessment/Plan   1. Acute hypoxemic respiratory failure: Due to COVID-19 PNA with bilateral infiltrates.  Refractory hypoxemia, VV-ECMO cannulation on 02/25/2020 with improvement in oxygenation.  Developed left PTX post-subclavian CVL and had left chest tube, the left lung is re-expanded and CT out.  He was extubated 1/2 but reintubated 1/4 with agitation and suspected aspiration.  Tracheostomy 1/6.  CTA chest 1/22 with suspected chronic PEs and ARDS. ECMO cannula repositioned 1/5. ECMO circuit changed 1/26. Extubated to HFNC on 2/5. ECMO circuit changed and cannula repositioned 2/22.  LDH stable. Has struggled with hypercarbia from significant dead space ventilation. Had massive air  entrainment 2/27 due to leakage at ECMO cannula insertion site. Cannula repositioned and resecured but still with some air entrainment with movement. Back on vent due to hemodynamic instability.  - Sweep weaned to off this morning and flow decreased.  Good oxygen saturation.  If ABG is reasonable and he tolerates sweep 0 for about 2 hours, will discontinue ECMO.  Will ask vascular to assess for possible assistance closing right IJ when cannula removed.  If he does not tolerate sweep 0, option would be fem-fem ECMO.  - Restart IV Lasix with I/Os + and continue acetazolamide.  - Patient has had remdesivir, tocilizumab, steroids for COVID-19. - Continue bivalirudin, goal PTT 65-80.  PTT 76. Discussed dosing with PharmD personally.  Stop prior to OR to remove IJ cannula.  - Limit mobilization until cannula issue sorted out - Due to lack of insurance, lung transplant currently is not an option. SW working on options. Will see if we can check PRA level to further evaluate transplant candidacy.  2. 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  Clot noted on TEE in RV as well.  TTE 1/2 showed normal EF 60-65%, RV improved (mildly dilated/dysfunctional). TEE on 1/5 with moderate to severe RV dysfunction but patient was hypoxemic.  Had 1/2 dose TPA on 1/5. Echo 1/20 with mildly dilated/mildly dysfunctional RV. CTA chest 1/22 with chronic-appearing PEs in upper lobes. - PTT 76. Bivalirudin for goal PTT 65-80. Personally reviewed 3. Left PTX: Left chest tube, lung is re-expanded. Tube now out, stable CXR. 4. Shock: Suspect septic/distributive. Now on low dose NE with sedation.  5. Anemia: Hgb 8.9, transfuse < 7.5.  6. AKI: Resolved 7. Hyperglycemia: insulin.  8. HTN: Controlled.     9. CHB: Episode of CHB when hypoxemic and with cough (suspect vagal).  NSR since then.   He has occasional short vagal episodes.  10. Thrombocytopenia: Resolved  11. FEN:  He is eating well, tube feeds off and Cortrack out.  12. Ischemic digits: LUE.  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  Repeat study 1/17 showed no obstruction.  - Wound Care following. 13. ID: Group F Strep and Enterococcus  faecalis in sputum. Initially completed abx for these bacteria. Now with recurrent sepsis, Enterococcus faecalis in blood now. Vancomycin restarted and continued to 2/21. TEE 2/7 with no vegetation.  - With 2/27 event, vancomycin/Zosyn were restarted.  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   - resolved 15. Hypernatremia: Resolved. Na 131  16. Depression: On sertraline 25 mg daily.   CRITICAL CARE Performed by: Loralie Champagne  Total critical care time: 50 minutes  Critical care time was exclusive of separately billable procedures and treating other patients.  Critical care was necessary to treat or prevent imminent or life-threatening deterioration.  Critical care was time spent personally by me on the following activities: development of treatment plan with patient and/or surrogate as well as nursing, discussions with consultants, evaluation of patient's response to treatment, examination of patient, obtaining history from patient or surrogate, ordering and performing treatments and interventions, ordering and review of laboratory studies, ordering and review of radiographic studies, pulse oximetry and re-evaluation of patient's condition.    Length of Stay: [place], MD  05/10/2020, 8:05 AM  Advanced Heart Failure Team Pager [phone] (M-F; 7a - 4p)  Please contact [facility] for night-coverage after hours (4p -7a ) and weekends on [email or web address]

## 2020-05-10 NOTE — Progress Notes (Signed)
Peripherally Inserted Central Catheter Placement  The IV Nurse has discussed with the patient and/or persons authorized to consent for the patient, the purpose of this procedure and the potential benefits and risks involved with this procedure.  The benefits include less needle sticks, lab draws from the catheter, and the patient may be discharged home with the catheter. Risks include, but not limited to, infection, bleeding, blood clot (thrombus formation), and puncture of an artery; nerve damage and irregular heartbeat and possibility to perform a PICC exchange if needed/ordered by physician.  Alternatives to this procedure were also discussed.  Bard Power PICC patient education guide, fact sheet on infection prevention and patient information card has been provided to patient /or left at bedside.    PICC Placement Documentation  PICC Triple Lumen 04/19/20 PICC Left Brachial 41 cm 0 cm (Active)  Indication for Insertion or Continuance of Line Prolonged intravenous therapies 05/10/20 0800  Exposed Catheter (cm) 0 cm 05/10/20 0129  Site Assessment Clean;Dry;Intact 05/10/20 0800  Lumen #1 Status Infusing 05/10/20 0800  Lumen #2 Status Infusing 05/10/20 0800  Lumen #3 Status Occluded 05/10/20 0800  Dressing Type Transparent;Occlusive 05/10/20 0800  Dressing Status Clean;Dry;Intact 05/10/20 0800  Antimicrobial disc in place? Yes 05/10/20 0800  Safety Lock Not Applicable 05/10/20 0800  Line Care Connections checked and tightened;Line pulled back 05/10/20 0129  Line Adjustment (NICU/IV Team Only) No 05/10/20 0129  Dressing Intervention Other (Comment) 05/09/20 2000  Dressing Change Due 05/10/20 05/10/20 0800     PICC Triple Lumen 05/10/20 PICC Right Brachial 37 cm 0 cm (Active)  Exposed Catheter (cm) 0 cm 05/10/20 1717  Site Assessment Clean;Dry;Intact 05/10/20 1717  Lumen #1 Status Flushed;Blood return noted;Saline locked 05/10/20 1717  Lumen #2 Status Flushed;Blood return noted;Saline locked  05/10/20 1717  Lumen #3 Status Flushed;Blood return noted;Saline locked 05/10/20 1717  Dressing Type Transparent;Securing device 05/10/20 1717  Dressing Status Clean;Dry;Intact 05/10/20 1717  Antimicrobial disc in place? Yes 05/10/20 1717  Safety Lock Not Applicable 05/10/20 1717  Dressing Change Due 05/17/20 05/10/20 1717       Romie Jumper 05/10/2020, 5:19 PM

## 2020-05-10 NOTE — Progress Notes (Signed)
Nutrition Follow-up  DOCUMENTATION CODES:   Not applicable  INTERVENTION:   Tube Feeding via Cortrak: Pivot 1.5 at 65 ml/hr Provides  2340 kcals, 146 g of protein and 1186 mL of free water Meets 100% estimated calorie and protein needs  NUTRITION DIAGNOSIS:   Increased nutrient needs related to acute illness,catabolic illness (COVID-19 infection) as evidenced by estimated needs.  Being addressed via supplemental TF, supplements  GOAL:   Patient will meet greater than or equal to 90% of their needs  Progressing  MONITOR:   Vent status,TF tolerance,Labs,Weight trends  REASON FOR ASSESSMENT:   LOS Enteral/tube feeding initiation and management  ASSESSMENT:   50 y.o. male with no significant medical history. He presented to the ED with dyspnea, cough, and N/V. He reported that his symptoms began with cough 1 week ago. He tried OTC meds but nothing helped. Symptoms worsened to include body aches, fatigue, and N/V 3 days later. He went to his PCP 12/26 and got tested for COVID; he was positive.   12/26 COVID+  12/28 Admitted to [facility] 12/29 Intubated 12/30 Transferred to [facility], VV ECMO cannulation, L PTX with Chest tube insertion 12/31 Cortrak placed, Post-pyloric  1/02 Extubated to HFNC/BiPap as needed, ECHO with EF 60-65% 1/06 TEE for ECMO cannula position, Re-Intubated, Rhinorockets placed for epistaxis, Cortrak malpositioned-repositioned and now gastric per xray 1/07 Trach placed 1/14 Cortrak advanced to post pyloric position 1/16 L. Chest tube removed 1/22 CT chest: bilateral UL PE 1/23 Bronch showed semi occlusive ?mass/polyp in trachea 1/24 Bronch showed resolution of 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 1/26 ECMO Circuit exchanged 2/03 Trach exchange 2/05 Extubated to HFNC 2/07 TEE with no vegetation, EF 55-60% 2/14 Cortrak replaced, post-pyloric 2/15 Changed to nocturnal TF  2/16 Thoracentesis with 500 mL  2/21 Cannula adjustment, circuit change 2/24 Cortrak removed per MD order,  nocturnal TF discontinued 2/26 Massive air entrainment due to leakage at cannula insertion site, repositioned and resutured 2/27 Re-intubated  Pt currently sedated on vent, decannulated from ECMO today Cortrak placed, plan to start TF  Labs: reviewed Meds: colace, nutrisource fiber, lasix, ss novolog, levemir, novolog with meals, reglan   Diet Order:   Diet Order    None      EDUCATION NEEDS:   Not appropriate for education at this time  Skin:  Skin Assessment: Skin Integrity Issues: Skin Integrity Issues:: Stage III DTI: buttocks, forhead Stage II: anus, coccyx Stage III: buttocks Other: ischemic changes to LUE, large bulla on L hand that ruptured  Last BM:  2/22  Height:   Ht Readings from Last 1 Encounters:  03/01/2020 5\' 4"  (1.626 m)    Weight:   Wt Readings from Last 1 Encounters:  05/08/20 77 kg    Ideal Body Weight:  59.1 kg  BMI:  Body mass index is 29.14 kg/m.  Estimated Nutritional Needs:   Kcal:  2300-2500 kcals  Protein:  130-150 g  Fluid:  >/= 2 L    05/10/20 MS, RDN, LDN, CNSC Registered Dietitian III Clinical Nutrition RD Pager and On-Call Pager Number Located in [place]

## 2020-05-10 NOTE — Progress Notes (Signed)
ECMO Decannulation:  ECMO team proceeded with decannulation 05/11/19 at 1200 led by Dr Vickey Sages. ECMO Specialist, ECMO Coordinator, Bedside RN, RT and Pharmacy at bedside. RPM's turned to 0 and arterial and venous line clamped; then Dr Vickey Sages proceeded with decannulation. Patient's vitals remained stable throughout procedure.1 unit of RBC's given post procedure. 30 minute post ABG shown below; Dr Shirlee Latch notified not changes made at this time.   Results for Alex [name], Alex [name] (MRN [id number]) as of 05/10/2020 12:43  Ref. Range 05/10/2020 12:37  Sample type Unknown ARTERIAL  pH, Arterial Latest Ref Range: 7.350 - 7.450  7.298 (L)  pCO2 arterial Latest Ref Range: 32.0 - 48.0 mmHg 58.8 (H)  pO2, Arterial Latest Ref Range: 83.0 - 108.0 mmHg 59 (L)  TCO2 Latest Ref Range: 22 - 32 mmol/L 31  Acid-Base Excess Latest Ref Range: 0.0 - 2.0 mmol/L 2.0  Bicarbonate Latest Ref Range: 20.0 - 28.0 mmol/L 28.8 (H)  O2 Saturation Latest Units: % 86.0  Patient temperature Unknown 98.4 F  Collection site Unknown Radial

## 2020-05-10 NOTE — Progress Notes (Signed)
ANTICOAGULATION CONSULT NOTE  Pharmacy Consult for bivalirudin Indication: ECMO and VTE  Labs: Recent Labs    05/08/20 0343 05/08/20 0636 05/09/20 0410 05/09/20 0422 05/09/20 1651 05/09/20 1652 05/09/20 2342 05/09/20 2344 05/10/20 0337 05/10/20 0340 05/10/20 1537 05/10/20 1538 05/10/20 2047 05/10/20 2204 05/10/20 2208  HGB 8.3*   < >  --    < > 9.2*   < >  --    < > 8.9*   < > 10.2* 10.2* 14.6  --  9.2*  HCT 25.9*   < >  --    < > 27.6*   < >  --    < > 26.5*   < > 31.8* 30.0* 43.0  --  27.0*  PLT 257   < >  --   --  191  --   --   --  183  --  171  --   --   --   --   APTT 65*   < > 61*  --  87*  --  82*  --  76*  --   --   --   --  87*  --   LABPROT 17.6*  --  18.8*  --   --   --   --   --  21.1*  --   --   --   --   --   --   INR 1.5*  --  1.6*  --   --   --   --   --  1.9*  --   --   --   --   --   --   CREATININE 0.77   < > 0.95  --  0.77  --   --   --  0.88  --  1.06  --   --   --   --    < > = values in this interval not displayed.    Assessment: 57 yoM admitted with COVID-19 PNA with worsening hypoxia, s/p cannulation for ECMO. Pt was started on IV heparin prior to cannulation due to acute DVTs and possible PE, transitioned to bivalirudin with ECMO. S/p tPA on 1/5 and tracheostomy on 1/6. ECMO circuit changed on 05/03/2020  APTT above goal  Goal of Therapy:  aPTT 60-80 seconds   Plan:  -Dec bivalirudin to 0.065 mg/kg/hr -Re-check aPTT with AM labs  Abran Duke, PharmD, BCPS Clinical Pharmacist Phone: [phone]

## 2020-05-10 NOTE — Consult Note (Signed)
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  Follow up visit not possible today due to the events over the weekend with the right IJ ECMO requiring reintubation.  Patient is not able to be turned or moved at this time.  He did not tolerate a slight tilt yesterday according to the bedside RN.  WOC nurse will follow along at a distance and attempt to see later in the week.  WOC nursing team will remain available to this patient, the nursing and medical teams.  Please re-consult if needed between visits. Thanks, Ladona Mow, MSN, RN, GNP, [facility]  Pager# [phone]

## 2020-05-10 NOTE — Progress Notes (Signed)
ECMO Sweep Trial   A sweep trial was started at 0740 per Dr. Tonia Brooms & Dr Shirlee Latch due to air entrainment from current cannulation site.  Vent settings: PS/CPAP FiO2: 100 PEEP: 10  Sweep trial PASS; Dr. Tonia Brooms, Dr. Shirlee Latch, Dr Vickey Sages & Dr Randie Heinz made aware.  Sweep trial on-going, parameters discussed, decannulation scheduled with Dr Vickey Sages & Dr Randie Heinz 05/11/19 at 1200 at the bedside. RT made aware and blood is at bedside for procedure.  ABG ABG - Last 2 Results Latest Ref Rng & Units 05/10/2020 05/10/2020  PH ART 7.350 - 7.450 7.352 7.326(L)  PCO2 ART 32.0 - 48.0 mmHg 55.6(H) 58.9(H)  PO2 ART 83.0 - 108.0 mmHg 200(H) 261(H)  BICARBONATE 20.0 - 28.0 mmol/L 30.8(H) 30.8(H)  ACID-BASE EXCESS 0.0 - 2.0 mmol/L 4.0(H) 4.0(H)  O2 SAT % 100.0 100.0  Some recent data might be hidden

## 2020-05-10 NOTE — Progress Notes (Signed)
ABG results obtained. Results called to MD. RT placed patient on PCV per MD vebal order and abg results. ABG pending.

## 2020-05-10 NOTE — Progress Notes (Addendum)
Notified McLean MD of patient's poor UOP. Ordered to give lasix as scheduled.

## 2020-05-10 NOTE — Procedures (Signed)
Extracorporeal support note  ECLS cannulation date: 02/26/2020 Last circuit change: 05/03/20  Major Circuit Event: Air Entrainment w/ Acute Pulmonary Air Embolism 05/09/2020  Indication: Acute hypoxemic respiratory failure secondary to COVID-19 pneumonia  Configuration: Venovenous ECMO  Drainage cannula: [address] cannula within the right internal jugular Return cannula: Same  Pump speed: 3400 RPM Pump flow: Flow (LPM): 4.08  Pump used: Cardio help  Oxygenator: Cardio help O2 blender: 100% Sweep gas: 0 L  Circuit check: There was small amount of air and trained last night during coughing episode.  Anticoagulant: Bivalirudin Anticoagulation targets: PTT goal 60-80  Changes in support:  Ventilator set to pressure support. Repeat arterial blood gas in 2 hours on 0 L sweep trial  Anticipated goals/duration of support: Bridge to recovery  Multidisciplinary team discussion at bedside this morning.  Pending arterial blood gas analysis will consider decannulation from VV ECMO.  Josephine Igo, DO [facility] 05/10/2020 11:14 AM

## 2020-05-10 NOTE — Progress Notes (Signed)
PT Cancellation Note  Patient Details Name: Alex [name] MRN: [id number] DOB: 08/08/1970   Cancelled Treatment:    Reason Eval/Treat Not Completed: Medical issues which prohibited therapy Pt with air embolism yesterday, requiring ECMO VV cannula repositioning. Pt remains intubated and sedated, not tolerating any movement. PT will follow back tomorrow to determine therapy appropriateness.  Laia Wiley B. Beverely Risen PT, DPT Acute Rehabilitation Services Pager [phone] Office [phone]    [facility] 05/10/2020, 8:35 AM

## 2020-05-10 NOTE — Progress Notes (Signed)
Pharmacy Antibiotic Note  Alex [name] is a 50 y.o. male on ECMO. Pharmacy consulted to dose vancomycin and zosyn for aspiration pneumonia. ECMO now decanulated so will adjust Zosyn dosing.  Plan: Adjust Zosyn to 3.375g IV EI q8h Continue vancomycin 1000mg  IV q12h   Height: 5\' 4"  (162.6 cm) Weight: 77 kg (169 lb 12.1 oz) IBW/kg (Calculated) : 59.2  Temp (24hrs), Avg:98.5 F (36.9 C), Min:98.1 F (36.7 C), Max:99 F (37.2 C)  Recent Labs  Lab 05/08/20 0343 05/08/20 1624 05/09/20 0140 05/09/20 0410 05/09/20 1651 05/10/20 0337  WBC 7.7 11.4* 8.0  --  7.6 8.5  CREATININE 0.77 0.65  --  0.95 0.77 0.88  LATICACIDVEN 0.8 0.7  --  1.4 0.6 0.5    Estimated Creatinine Clearance: 97.3 mL/min (by C-G formula based on SCr of 0.88 mg/dL).    No Known Allergies   05/11/20, PharmD, BCPS, [facility] Clinical Pharmacist [phone] Please check AMION for all [facility] Pharmacy numbers 05/10/2020

## 2020-05-10 NOTE — Progress Notes (Addendum)
Paged Shirlee Latch MD in regards to low UOP (despite 40 mg lasix) and CO2 on ABG. Orders received to check CVP when new PICC placed.

## 2020-05-10 NOTE — Progress Notes (Signed)
ANTICOAGULATION CONSULT NOTE  Pharmacy Consult for bivalirudin Indication: ECMO and VTE  Labs: Recent Labs    05/07/20 0427 05/07/20 0624 05/08/20 0343 05/08/20 0636 05/08/20 1624 05/08/20 1630 05/09/20 0140 05/09/20 0410 05/09/20 0422 05/09/20 1651 05/09/20 1652 05/09/20 1758 05/09/20 1855 05/09/20 2342 05/09/20 2344  HGB 8.3*   < > 8.3*   < > 9.0*   < > 8.7*  --    < > 9.2*   < > 8.8* 8.8*  --  8.8*  HCT 24.2*   < > 25.9*   < > 28.3*   < > 25.4*  --    < > 27.6*   < > 26.0* 26.0*  --  26.0*  PLT 246   < > 257  --  263  --  238  --   --  191  --   --   --   --   --   APTT 68*   < > 65*  --  63*  --   --  61*  --  87*  --   --   --  82*  --   LABPROT 17.5*  --  17.6*  --   --   --   --  18.8*  --   --   --   --   --   --   --   INR 1.5*  --  1.5*  --   --   --   --  1.6*  --   --   --   --   --   --   --   CREATININE 0.68   < > 0.77  --  0.65  --   --  0.95  --  0.77  --   --   --   --   --    < > = values in this interval not displayed.    Assessment: 4 yoM admitted with COVID-19 PNA with worsening hypoxia, s/p cannulation for ECMO. Pt was started on IV heparin prior to cannulation due to acute DVTs and possible PE, transitioned to bivalirudin with ECMO. S/p tPA on 1/5 and tracheostomy on 1/6. ECMO circuit changed on 05/03/2020  APTT is just above goal   Goal of Therapy:  aPTT 60-80 seconds   Plan:  -Dec bivalirudin to 0.08 mg/kg/hr -Re-check aPTT with AM labs  Abran Duke, PharmD, BCPS Clinical Pharmacist Phone: [phone]

## 2020-05-11 ENCOUNTER — Inpatient Hospital Stay (HOSPITAL_COMMUNITY): Payer: Medicaid Other

## 2020-05-11 LAB — POCT I-STAT 7, (LYTES, BLD GAS, ICA,H+H)
Acid-Base Excess: 0 mmol/L (ref 0.0–2.0)
Acid-Base Excess: 1 mmol/L (ref 0.0–2.0)
Bicarbonate: 27.5 mmol/L (ref 20.0–28.0)
Bicarbonate: 29.5 mmol/L — ABNORMAL HIGH (ref 20.0–28.0)
Calcium, Ion: 1.16 mmol/L (ref 1.15–1.40)
Calcium, Ion: 1.16 mmol/L (ref 1.15–1.40)
HCT: 27 % — ABNORMAL LOW (ref 39.0–52.0)
HCT: 27 % — ABNORMAL LOW (ref 39.0–52.0)
Hemoglobin: 9.2 g/dL — ABNORMAL LOW (ref 13.0–17.0)
Hemoglobin: 9.2 g/dL — ABNORMAL LOW (ref 13.0–17.0)
O2 Saturation: 93 %
O2 Saturation: 91 %
Patient temperature: 99
Patient temperature: 98.4
Potassium: 4.2 mmol/L (ref 3.5–5.1)
Potassium: 3.7 mmol/L (ref 3.5–5.1)
Sodium: 131 mmol/L — ABNORMAL LOW (ref 135–145)
Sodium: 135 mmol/L (ref 135–145)
TCO2: 29 mmol/L (ref 22–32)
TCO2: 32 mmol/L (ref 22–32)
pCO2 arterial: 60 mmHg — ABNORMAL HIGH (ref 32.0–48.0)
pCO2 arterial: 68.4 mmHg (ref 32.0–48.0)
pH, Arterial: 7.27 — ABNORMAL LOW (ref 7.350–7.450)
pH, Arterial: 7.242 — ABNORMAL LOW (ref 7.350–7.450)
pO2, Arterial: 79 mmHg — ABNORMAL LOW (ref 83.0–108.0)
pO2, Arterial: 73 mmHg — ABNORMAL LOW (ref 83.0–108.0)

## 2020-05-11 LAB — TYPE AND SCREEN
ABO/RH(D): B POS
Antibody Screen: NEGATIVE
Unit division: 0
Unit division: 0
Unit division: 0
Unit division: 0
Unit division: 0
Unit division: 0

## 2020-05-11 LAB — CBC
HCT: 28.8 % — ABNORMAL LOW (ref 39.0–52.0)
HCT: 29.1 % — ABNORMAL LOW (ref 39.0–52.0)
Hemoglobin: 9.3 g/dL — ABNORMAL LOW (ref 13.0–17.0)
Hemoglobin: 9.2 g/dL — ABNORMAL LOW (ref 13.0–17.0)
MCH: 28.8 pg (ref 26.0–34.0)
MCH: 28.6 pg (ref 26.0–34.0)
MCHC: 32.3 g/dL (ref 30.0–36.0)
MCHC: 31.6 g/dL (ref 30.0–36.0)
MCV: 89.2 fL (ref 80.0–100.0)
MCV: 90.4 fL (ref 80.0–100.0)
Platelets: 182 10*3/uL (ref 150–400)
Platelets: 228 10*3/uL (ref 150–400)
RBC: 3.23 MIL/uL — ABNORMAL LOW (ref 4.22–5.81)
RBC: 3.22 MIL/uL — ABNORMAL LOW (ref 4.22–5.81)
RDW: 17.7 % — ABNORMAL HIGH (ref 11.5–15.5)
RDW: 17.5 % — ABNORMAL HIGH (ref 11.5–15.5)
WBC: 12.4 10*3/uL — ABNORMAL HIGH (ref 4.0–10.5)
WBC: 10.6 10*3/uL — ABNORMAL HIGH (ref 4.0–10.5)
nRBC: 0 % (ref 0.0–0.2)
nRBC: 0 % (ref 0.0–0.2)

## 2020-05-11 LAB — BASIC METABOLIC PANEL
Anion gap: 14 (ref 5–15)
Anion gap: 12 (ref 5–15)
BUN: 37 mg/dL — ABNORMAL HIGH (ref 6–20)
BUN: 44 mg/dL — ABNORMAL HIGH (ref 6–20)
CO2: 25 mmol/L (ref 22–32)
CO2: 28 mmol/L (ref 22–32)
Calcium: 8.2 mg/dL — ABNORMAL LOW (ref 8.9–10.3)
Calcium: 8.3 mg/dL — ABNORMAL LOW (ref 8.9–10.3)
Chloride: 94 mmol/L — ABNORMAL LOW (ref 98–111)
Chloride: 94 mmol/L — ABNORMAL LOW (ref 98–111)
Creatinine, Ser: 1.41 mg/dL — ABNORMAL HIGH (ref 0.61–1.24)
Creatinine, Ser: 1.34 mg/dL — ABNORMAL HIGH (ref 0.61–1.24)
GFR, Estimated: >60 mL/min (ref >60)
GFR, Estimated: >60 mL/min (ref >60)
Glucose, Bld: 183 mg/dL — ABNORMAL HIGH (ref 70–99)
Glucose, Bld: 238 mg/dL — ABNORMAL HIGH (ref 70–99)
Potassium: 4.3 mmol/L (ref 3.5–5.1)
Potassium: 4 mmol/L (ref 3.5–5.1)
Sodium: 133 mmol/L — ABNORMAL LOW (ref 135–145)
Sodium: 134 mmol/L — ABNORMAL LOW (ref 135–145)

## 2020-05-11 LAB — BPAM RBC
Blood Product Expiration Date: 202203172359
Blood Product Expiration Date: 202203192359
Blood Product Expiration Date: 202203222359
Blood Product Expiration Date: 202203222359
Blood Product Expiration Date: 202203222359
Blood Product Expiration Date: 202203232359
ISSUE DATE / TIME: 202202270346
ISSUE DATE / TIME: 202202281051
ISSUE DATE / TIME: 202202281051
ISSUE DATE / TIME: 202202281051
ISSUE DATE / TIME: 202202281051
Unit Type and Rh: 7300
Unit Type and Rh: 7300
Unit Type and Rh: 7300
Unit Type and Rh: 7300
Unit Type and Rh: 7300
Unit Type and Rh: 7300

## 2020-05-11 LAB — GLUCOSE, CAPILLARY
Glucose-Capillary: 155 mg/dL — ABNORMAL HIGH (ref 70–99)
Glucose-Capillary: 186 mg/dL — ABNORMAL HIGH (ref 70–99)
Glucose-Capillary: 197 mg/dL — ABNORMAL HIGH (ref 70–99)
Glucose-Capillary: 219 mg/dL — ABNORMAL HIGH (ref 70–99)
Glucose-Capillary: 227 mg/dL — ABNORMAL HIGH (ref 70–99)
Glucose-Capillary: 245 mg/dL — ABNORMAL HIGH (ref 70–99)

## 2020-05-11 LAB — FIBRINOGEN: Fibrinogen: 651 mg/dL — ABNORMAL HIGH (ref 210–475)

## 2020-05-11 LAB — LACTIC ACID, PLASMA: Lactic Acid, Venous: 1 mmol/L (ref 0.5–1.9)

## 2020-05-11 LAB — LACTATE DEHYDROGENASE: LDH: 557 U/L — ABNORMAL HIGH (ref 98–192)

## 2020-05-11 LAB — PROTIME-INR
INR: 2 — ABNORMAL HIGH (ref 0.8–1.2)
Prothrombin Time: 21.5 seconds — ABNORMAL HIGH (ref 11.4–15.2)

## 2020-05-11 LAB — CULTURE, BAL-QUANTITATIVE W GRAM STAIN
Culture: 80000 — AB
Gram Stain: NONE SEEN

## 2020-05-11 LAB — APTT
aPTT: 82 seconds — ABNORMAL HIGH (ref 24–36)
aPTT: 83 seconds — ABNORMAL HIGH (ref 24–36)

## 2020-05-11 MED ORDER — GADOBUTROL 1 MMOL/ML IV SOLN
8.0000 mL | Freq: Once | INTRAVENOUS | Status: AC | PRN
  Administered 2020-05-11: 8 mL via INTRAVENOUS

## 2020-05-11 MED ORDER — FUROSEMIDE 10 MG/ML IJ SOLN
10.0000 mg/h | INTRAVENOUS | Status: DC
  Administered 2020-05-11 – 2020-05-12 (×2): 10 mg/h via INTRAVENOUS
  Filled 2020-05-11 (×3): qty 20

## 2020-05-11 MED ORDER — CHLORHEXIDINE GLUCONATE 0.12 % MT SOLN
OROMUCOSAL | Status: AC
  Administered 2020-05-11: 15 mL via OROMUCOSAL
  Filled 2020-05-11: qty 15

## 2020-05-11 MED ORDER — ETOMIDATE 2 MG/ML IV SOLN
20.0000 mg | Freq: Once | INTRAVENOUS | Status: AC
  Administered 2020-05-11: 20 mg via INTRAVENOUS
  Filled 2020-05-11: qty 10

## 2020-05-11 MED ORDER — FUROSEMIDE 10 MG/ML IJ SOLN
40.0000 mg | Freq: Once | INTRAMUSCULAR | Status: AC
  Administered 2020-05-11: 40 mg via INTRAVENOUS
  Filled 2020-05-11: qty 4

## 2020-05-11 NOTE — Progress Notes (Signed)
Inpatient Diabetes Program Recommendations  AACE/ADA: New Consensus Statement on Inpatient Glycemic Control (2015)  Target Ranges:  Prepandial:   less than 140 mg/dL      Peak postprandial:   less than 180 mg/dL (1-2 hours)      Critically ill patients:  140 - 180 mg/dL   Lab Results  Component Value Date   GLUCAP 197 (H) 05/11/2020   HGBA1C 11.8 (H) 03/03/2020    Review of Glycemic Control Results for ADEEB, KONECNY (MRN [id number]) as of 05/11/2020 08:48  Ref. Range 05/10/2020 20:16 05/11/2020 00:32 05/11/2020 04:07 05/11/2020 07:46  Glucose-Capillary Latest Ref Range: 70 - 99 mg/dL 98 277 (H) 824 (H) 235 (H)   Diabetes history:Type 2 Dm Current orders for Inpatient glycemic control:Novolog 0-20 units Q4H Pivot @ 30 ml/hr  Inpatient Diabetes Program Recommendations:  As glucose trends increase with start of tube feeds, consider adding Novolog 3 units Q4H (to be stopped or held in the event tube feeds are stopped).   Thanks, Lujean Rave, MSN, RNC-OB Diabetes Coordinator [phone] (8a-5p)

## 2020-05-11 NOTE — Progress Notes (Addendum)
ANTICOAGULATION CONSULT NOTE  Pharmacy Consult for bivalirudin Indication: ECMO and VTE  Labs: Recent Labs    05/09/20 0410 05/09/20 0422 05/10/20 0337 05/10/20 0340 05/10/20 1537 05/10/20 1538 05/10/20 2204 05/10/20 2208 05/11/20 0409 05/11/20 0410 05/11/20 1749  HGB  --    < > 8.9*   < > 10.2*   < >  --    < > 9.2* 9.3* 9.2*  HCT  --    < > 26.5*   < > 31.8*   < >  --    < > 27.0* 28.8* 29.1*  PLT  --    < > 183  --  171  --   --   --   --  182 228  APTT 61*   < > 76*  --   --   --  87*  --   --  82* 83*  LABPROT 18.8*  --  21.1*  --   --   --   --   --   --  21.5*  --   INR 1.6*  --  1.9*  --   --   --   --   --   --  2.0*  --   CREATININE 0.95   < > 0.88  --  1.06  --   --   --   --  1.41* 1.34*   < > = values in this interval not displayed.    Assessment: 35 yoM admitted with COVID-19 PNA with worsening hypoxia, s/p cannulation for ECMO. Pt was started on IV heparin prior to cannulation due to acute DVTs and possible PE, transitioned to bivalirudin with ECMO. S/p tPA on 1/5 and tracheostomy on 1/6. ECMO circuit changed on 05/03/2020. ECMO decanulated 2/28, continuing bivalirudin with DVTs.  APTT remains slightly supratherapeutic this evening but decreased after rate decrease earlier today, CBC stable, fibrinogen and LDH rising. No bleeding or issues with infusion per discussion with RN.  Goal of Therapy:  aPTT 60-80 seconds   Plan:  -Reduce bivalirudin to 0.055 mg/kg/h -Continue q12h coag checks for now - will begin daily once aPTT stabilizes   Leia Alf, PharmD, BCPS Please check AMION for all [facility] Pharmacy contact numbers Clinical Pharmacist 05/11/2020 7:50 PM

## 2020-05-11 NOTE — Progress Notes (Signed)
PT Cancellation Note  Patient Details Name: Alex [name] MRN: [id number] DOB: 11/30/1970   Cancelled Treatment:    Reason Eval/Treat Not Completed: (P) Medical issues which prohibited therapy Pt ECMO decannulated yesterday. Currently back on ventilator with poor response to stimulation. MD requests hold of therapy today. PT will follow back tomorrow to determine appropriateness of treatment.   Erian Lariviere B. Beverely Risen PT, DPT Acute Rehabilitation Services Pager [phone] Office [phone]    [facility] 05/11/2020, 8:18 AM

## 2020-05-11 NOTE — Progress Notes (Signed)
OT Cancellation Note  Patient Details Name: Deandrea Rion MRN: [id number] DOB: February 12, 1971   Cancelled Treatment:    Reason Eval/Treat Not Completed: Medical issues which prohibited therapy;Other (comment) pt now with air embolism in ECMO circuit, pt now reintubated and sedated for cannula adjustment, per chart review MD requesting to hold therapy for today. OT to f/u pending pt medically stable.   Pollyann Glen K., COTA/L Acute Rehabilitation Services [phone]    Barron Schmid 05/11/2020, 1:17 PM

## 2020-05-11 NOTE — Progress Notes (Signed)
Pharmacy Antibiotic Note  Alex [name] is a 50 y.o. male on ECMO. Pharmacy consulted to dose vancomycin and zosyn for aspiration pneumonia. Cr has risen overnight from 0.88 to 1.41 mg/dl.   Plan: Continue Zosyn 3.375g IV EI q8h for now Stop vancomycin   Height: 5\' 4"  (162.6 cm) Weight: 80.6 kg (177 lb 11.1 oz) IBW/kg (Calculated) : 59.2  Temp (24hrs), Avg:98.7 F (37.1 C), Min:98.1 F (36.7 C), Max:100 F (37.8 C)  Recent Labs  Lab 05/09/20 0140 05/09/20 0410 05/09/20 1651 05/10/20 0337 05/10/20 1537 05/11/20 0410  WBC 8.0  --  7.6 8.5 12.2* 12.4*  CREATININE  --  0.95 0.77 0.88 1.06 1.41*  LATICACIDVEN  --  1.4 0.6 0.5 0.6 1.0    Estimated Creatinine Clearance: 62.1 mL/min (A) (by C-G formula based on SCr of 1.41 mg/dL (H)).    No Known Allergies   07/11/20, PharmD, BCPS, [facility] Clinical Pharmacist [phone] Please check AMION for all [facility] Pharmacy numbers 05/11/2020

## 2020-05-11 NOTE — Progress Notes (Signed)
Patient ID: Alex [name], male   DOB: 1970-10-09, 50 y.o.   MRN: [id number]     Advanced Heart Failure Rounding Note  PCP-Cardiologist: No primary care provider on file.   Subjective:    - 12/30: VV ECMO cannulation - 12/31: Left chest tube replaced - 1/2: Extubated. Echo with EF 60-65%, mildly dilated RV with mildly decreased systolic function.  - 1/4: Agitated, suspected aspiration.  Re-intubated.  - 1/5: ECMO cannula repositioned under TEE guidance. TEE showed moderately dilated/moderate-severely dysfunctional RV in setting of hypoxemia. LUE DVT found.  Patient got 1/2 dose of TPA due to initial concern for large PE.  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  - 1/6: Tracheostomy - 1/7: Echo with mild RV dilation/mild RV dysfunction.  - 1/16: Left chest tube out - 1/17: LUE arterial dopplers repeated, showed no obstruction.  - 1/20: Echo with EF 65-70%, mildly D-shaped septum, mildly dilated and mildly dysfunctional RV.  - 1/22: CTA chest: Bilateral upper lobe PEs (suspect chronic), changes c/w ARDS - 1/23: Bronchoscopy showed semi-occlusive ?mass/polyp in the trachea.  - 1/24: Bronchoscopy showed resolution of 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 - 1/26: ECMO circuit changed.  - 2/1: CT chest showed diffuse bronchiectasis as well as diffuse opacity consistent with COVID-19 PNA with ARDS. - 2/3: Trach exchange - 2/5: Decannulated to HFNC - 2/6: Enterococcal PNA/bacteremia.  Echo showed EF 65-70%, normal-appearing RV, no vegetation noted.  - 2/7: TEE with no vegetation, EF 55-60%, RV low normal function with normal size.  - 2/16: Right thoracentesis 500 cc - 2/22: ECMO circuit changed.  ECMO cannula repositioned under TEE guidance. TEE with EF 60-65%, normal RV.  - 2/27: Massive air entrainment due to leakage at cannula insertion site -> cannula repositioning & resuturing.  Re-intubated.  - 2/28: ECMO decannulated due to recurrent air entrainment.  CT head w/o acute changes.    Decannulated yesterday.  Now in sinus tachycardia (off beta blocker).  On full vent support. Sedation has been weaned, seems to have some response.  Poor UOP, I/Os positive.  CVP 18.   Tm 100, he is on vancomycin/Zosyn  ABG 7.27/60/79/93% Lactate 1.0 Hgb 9.3  Objective:   Weight Range: 80.6 kg Body mass index is 30.5 kg/m.   Vital Signs:   Temp:  [98.1 F (36.7 C)-100 F (37.8 C)] 99 F (37.2 C) (03/01 0346) Pulse Rate:  [76-135] 122 (03/01 0730) Resp:  [12-40] 34 (03/01 0730) BP: (93-141)/(55-86) 114/55 (03/01 0725) SpO2:  [81 %-100 %] 93 % (03/01 0730) Arterial Line BP: (83-194)/(43-80) 112/55 (03/01 0730) FiO2 (%):  [90 %-100 %] 100 % (03/01 0725) Weight:  [80.6 kg] 80.6 kg (03/01 0600) Last BM Date: 05/07/20  Weight change: Filed Weights   05/07/20 0700 05/08/20 0700 05/11/20 0600  Weight: 77.8 kg 77 kg 80.6 kg    Intake/Output:   Intake/Output Summary (Last 24 hours) at 05/11/2020 0737 Last data filed at 05/11/2020 0700 Gross per 24 hour  Intake 2839.68 ml  Output 750 ml  Net 2089.68 ml      Physical Exam    General: Intubated/sedated Neck: JVP 14-16. no thyromegaly or thyroid nodule.  Lungs: Decreased bilaterally.  CV: Nondisplaced PMI.  Heart tachy, regular S1/S2, no S3/S4, no murmur.  1+ edema to knees. Abdomen: Soft, nontender, no hepatosplenomegaly, no distention.  Skin: Intact without lesions or rashes.  Neurologic: Sedated.  Extremities: Dry gangrene left digits.  HEENT: Normal.    Telemetry   Sinus tachy, 110s. Personally reviewed  Labs  CBC Recent Labs    05/10/20 1537 05/10/20 1538 05/11/20 0409 05/11/20 0410  WBC 12.2*  --   --  12.4*  HGB 10.2*   < > 9.2* 9.3*  HCT 31.8*   < > 27.0* 28.8*  MCV 90.3  --   --  89.2  PLT 171  --   --  182   < > = values in this interval not displayed.   Basic Metabolic Panel Recent Labs    05/10/20 1537 05/10/20 1538 05/11/20 0409 05/11/20 0410  NA 133*   < > 131* 133*  K 4.2   < >  4.2 4.3  CL 96*  --   --  94*  CO2 27  --   --  25  GLUCOSE 96  --   --  183*  BUN 29*  --   --  37*  CREATININE 1.06  --   --  1.41*  CALCIUM 8.1*  --   --  8.2*   < > = values in this interval not displayed.   Liver Function Tests No results for input(s): AST, ALT, ALKPHOS, BILITOT, PROT, ALBUMIN in the last 72 hours. No results for input(s): LIPASE, AMYLASE in the last 72 hours. Cardiac Enzymes No results for input(s): CKTOTAL, CKMB, CKMBINDEX, TROPONINI in the last 72 hours.  BNP: BNP (last 3 results) No results for input(s): BNP in the last 8760 hours.  ProBNP (last 3 results) No results for input(s): PROBNP in the last 8760 hours.   D-Dimer No results for input(s): DDIMER in the last 72 hours. Hemoglobin A1C No results for input(s): HGBA1C in the last 72 hours. Fasting Lipid Panel No results for input(s): CHOL, HDL, LDLCALC, TRIG, CHOLHDL, LDLDIRECT in the last 72 hours. Thyroid Function Tests No results for input(s): TSH, T4TOTAL, T3FREE, THYROIDAB in the last 72 hours.  Invalid input(s): FREET3  Other results:   Imaging    CT HEAD WO CONTRAST  Result Date: 05/10/2020 CLINICAL DATA:  Neuro deficit.  Stroke suspected. EXAM: CT HEAD WITHOUT CONTRAST TECHNIQUE: Contiguous axial images were obtained from the base of the skull through the vertex without intravenous contrast. COMPARISON:  None. FINDINGS: Brain: No evidence of large-territorial acute infarction. No parenchymal hemorrhage. No mass lesion. No extra-axial collection. No mass effect or midline shift. No hydrocephalus. Basilar cisterns are patent. Vascular: No hyperdense vessel. Skull: No acute fracture or focal lesion. Sinuses/Orbits: High mucosal thickening within the bilateral maxillary sinuses. Almost complete opacification of bilateral ethmoid and sphenoid sinuses. Partial opacification of bilateral ethmoid sinuses. Fluid noted within bilateral mastoid air cells and middle ears. The orbits are unremarkable.  Other: Partially visualized nasogastric tube noted. Endotracheal tube partially visualized. IMPRESSION: 1. No acute intracranial abnormality. 2. Mucosal thickening of all paranasal sinuses, bilateral mastoid air cells, and bilateral middle ears. Electronically Signed   By: Iven Finn M.D.   On: 05/10/2020 15:13   DG Abd Portable 1V  Result Date: 05/10/2020 CLINICAL DATA:  Feeding tube placement EXAM: PORTABLE ABDOMEN - 1 VIEW COMPARISON:  05/09/2020 FINDINGS: Feeding tube tip within the left mid abdomen, likely within jejunum. Nonobstructed bowel-gas pattern with moderate stool. No radiopaque calculi. IMPRESSION: Feeding tube tip overlies the left mid abdomen, likely within jejunum. Electronically Signed   By: Donavan Foil M.D.   On: 05/10/2020 18:16   [place] EKG SITE RITE  Result Date: 05/10/2020 If [facility] image not attached, placement could not be confirmed due to current cardiac rhythm.    Medications:  Scheduled Medications: . bethanechol  10 mg Per Tube TID  . chlorhexidine gluconate (MEDLINE KIT)  15 mL Mouth Rinse BID  . Chlorhexidine Gluconate Cloth  6 each Topical Daily  . chlorpheniramine-HYDROcodone  5 mL Per Tube Q12H  . clonazePAM  0.5 mg Per Tube Daily  . clonazePAM  1 mg Per Tube QHS  . dextromethorphan  30 mg Per Tube TID  . docusate  100 mg Per Tube BID  . furosemide  40 mg Intravenous Once  . gabapentin  300 mg Per Tube Q12H  . insulin aspart  0-20 Units Subcutaneous Q4H  . ipratropium-albuterol  3 mL Nebulization BID  . mouth rinse  15 mL Mouth Rinse 10 times per day  . melatonin  5 mg Per Tube QHS  . metoCLOPramide (REGLAN) injection  10 mg Intravenous Q8H  . multivitamin  15 mL Per Tube Daily  . pantoprazole sodium  40 mg Per Tube QHS  . QUEtiapine  50 mg Per Tube QHS  . sennosides  5 mL Per Tube QHS  . sertraline  25 mg Per Tube Daily  . sodium chloride flush  10-40 mL Intracatheter Q12H    Infusions: . sodium chloride    . sodium chloride 10  mL/hr at 05/11/20 0700  . albumin human Stopped (04/23/20 0041)  . bivalirudin (ANGIOMAX) infusion 0.5 mg/mL (Non-ACS indications) 0.06 mg/kg/hr (05/11/20 0700)  . dexmedetomidine (PRECEDEX) IV infusion Stopped (05/11/20 0432)  . feeding supplement (PIVOT 1.5 CAL) 30 mL/hr at 05/11/20 0200  . fentaNYL infusion INTRAVENOUS 50 mcg/hr (05/11/20 0700)  . furosemide (LASIX) 200 mg in dextrose 5% 100 mL (73m/mL) infusion    . norepinephrine (LEVOPHED) Adult infusion 1.5 mcg/min (05/11/20 0700)  . piperacillin-tazobactam (ZOSYN)  IV Stopped (05/11/20 0502)  . vancomycin Stopped (05/11/20 0610)    PRN Medications: Place/Maintain arterial line **AND** sodium chloride, acetaminophen (TYLENOL) oral liquid 160 mg/5 mL, albumin human, [DISCONTINUED] lidocaine **AND** albuterol, clonazePAM, guaiFENesin, hydrALAZINE, labetalol, lip balm, ondansetron (ZOFRAN) IV, oxyCODONE, phenol, polyethylene glycol, promethazine-codeine, simethicone, sodium chloride, sodium chloride flush   Assessment/Plan   1. Acute hypoxemic respiratory failure: Due to COVID-19 PNA with bilateral infiltrates.  Refractory hypoxemia, VV-ECMO cannulation on 02/18/2020 with improvement in oxygenation.  Developed left PTX post-subclavian CVL and had left chest tube, the left lung is re-expanded and CT out.  He was extubated 1/2 but reintubated 1/4 with agitation and suspected aspiration.  Tracheostomy 1/6.  CTA chest 1/22 with suspected chronic PEs and ARDS. ECMO cannula repositioned 1/5. ECMO circuit changed 1/26. Extubated to HFNC on 2/5. ECMO circuit changed and cannula repositioned 2/22.  LDH stable. Has struggled with hypercarbia from significant dead space ventilation. Had massive air entrainment 2/27 due to leakage at ECMO cannula insertion site. Cannula repositioned and resecured but still with some air entrainment with movement. ECMO decannulated on 2/28.  He is back on full vent.  CVP 18.  - Lasix 40 mg IV bolus then 10 mg/hr.  - Patient  has had remdesivir, tocilizumab, steroids for COVID-19. - Due to lack of insurance, lung transplant currently is not an option. SW working on options. Will see if we can check PRA level to further evaluate transplant candidacy.  2. 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  Clot noted on TEE in RV as well.  TTE 1/2 showed normal EF 60-65%, RV improved (mildly dilated/dysfunctional). TEE on 1/5 with moderate to severe RV dysfunction but patient was hypoxemic.  Had 1/2 dose  TPA on 1/5. Echo 1/20 with mildly dilated/mildly dysfunctional RV. CTA chest 1/22 with chronic-appearing PEs in upper lobes. - on Bivalirudin. 3. Left PTX: Left chest tube, lung is re-expanded. Tube now out, stable CXR. 4. Shock: Suspect septic/distributive. He is off NE now.  5. Anemia: Hgb 9.3, transfuse < 7.5.  6. AKI: Creatinine up to 1.4 with poor UOP.  Starting Lasix gtt as above.  7. Hyperglycemia: insulin.  8. HTN: Controlled.     9. CHB: Episode of CHB when hypoxemic and with cough (suspect vagal).  NSR since then.   He has occasional short vagal episodes.  10. Thrombocytopenia: Resolved  11. FEN: He is eating well, tube feeds off and [place] out.  12. Ischemic digits: LUE.  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  Repeat study 1/17 showed no obstruction.  - Wound Care following. 13. ID: Group F Strep and Enterococcus faecalis in sputum. Initially completed abx for these bacteria. Now with recurrent sepsis, Enterococcus faecalis in blood now. Vancomycin restarted and continued to 2/21. TEE 2/7 with no vegetation. Tm 100.  - Send cultures.  - With 2/27 event, vancomycin/Zosyn were restarted.  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   - resolved 16. Neuro: CT head negative 2/28, ?some response per nursing.   - May need MRI head.    CRITICAL CARE Performed by: Loralie Champagne  Total critical care time: 35 minutes  Critical care time was exclusive of separately billable procedures and treating other patients.  Critical care was necessary to treat or prevent imminent or life-threatening deterioration.  Critical care was time spent personally by me on the following activities: development of treatment plan with patient and/or surrogate as well as nursing, discussions with consultants, evaluation of patient's response to treatment, examination of patient, obtaining history from patient or surrogate, ordering and performing treatments and interventions, ordering and review of laboratory studies, ordering and review of radiographic studies, pulse oximetry and re-evaluation of patient's condition.    Length of Stay: [place], [place]  05/11/2020, 7:37 AM  Advanced Heart Failure Team Pager [phone] (M-F; 7a - 4p)  Please contact [facility] for night-coverage after hours (4p -7a ) and weekends on [email or web address]

## 2020-05-11 NOTE — Progress Notes (Signed)
ANTICOAGULATION CONSULT NOTE  Pharmacy Consult for bivalirudin Indication: ECMO and VTE  Labs: Recent Labs    05/09/20 0410 05/09/20 0422 05/10/20 0337 05/10/20 0340 05/10/20 1537 05/10/20 1538 05/10/20 2204 05/10/20 2208 05/11/20 0409 05/11/20 0410  HGB  --    < > 8.9*   < > 10.2*   < >  --  9.2* 9.2* 9.3*  HCT  --    < > 26.5*   < > 31.8*   < >  --  27.0* 27.0* 28.8*  PLT  --    < > 183  --  171  --   --   --   --  182  APTT 61*   < > 76*  --   --   --  87*  --   --  82*  LABPROT 18.8*  --  21.1*  --   --   --   --   --   --  21.5*  INR 1.6*  --  1.9*  --   --   --   --   --   --  2.0*  CREATININE 0.95   < > 0.88  --  1.06  --   --   --   --  1.41*   < > = values in this interval not displayed.    Assessment: 54 yoM admitted with COVID-19 PNA with worsening hypoxia, s/p cannulation for ECMO. Pt was started on IV heparin prior to cannulation due to acute DVTs and possible PE, transitioned to bivalirudin with ECMO. S/p tPA on 1/5 and tracheostomy on 1/6. ECMO circuit changed on 05/03/2020. ECMO decanulated 2/28, continuing bivalirudin with DVTs.  APTT slightly supra therapeutic this morning, CBC stable, fibrinogen rising.  Goal of Therapy:  aPTT 60-80 seconds   Plan:  -Reduce bivalirudin to 0.06 mg/kg/h -Continue q12h coag checks today - will begin daily once aPTT stabilizes   Fredonia Highland, PharmD, [place], [facility] Clinical Pharmacist [phone] Please check AMION for all [facility] Pharmacy numbers 05/11/2020

## 2020-05-11 NOTE — Progress Notes (Signed)
Patient was transported to MRI & back to 2H24 without any complications.

## 2020-05-11 NOTE — Progress Notes (Signed)
EEG complete - results pending 

## 2020-05-11 NOTE — Progress Notes (Signed)
eLink Physician-Brief Progress Note Patient Name: Alex [name] DOB: 12/09/1970 MRN: [id number]   Date of Service  05/11/2020  HPI/Events of Note  ABG on 90%/PC 20/Rate 20/P 10 = 7.242/68.4/73. Patient is breathing spontaneously at a rate of 32.   eICU Interventions  Plan: 1. Increase Rate to 32. 2. Repeat ABG at 5 AM.     Intervention Category Major Interventions: Acid-Base disturbance - evaluation and management;Respiratory failure - evaluation and management  Lenell Antu 05/11/2020, 9:51 PM

## 2020-05-11 NOTE — Consult Note (Signed)
WOC Nurse wound follow up Wound type:Pressure, Stage 3 Measurement: 4.2cm x 4cm x 0.2cm  Wound bed:100% red, Moist Drainage (amount, consistency, odor) Small amount serous exudate Periwound:intact, open wound edge Dressing procedure/placement/frequency: Continue xeroform gauze as a wound contact layer, top with single piece of dry gauze 4x4, cover with silicone foam dressing.  Turn to offload as able while maintaining hemodynamic stability. Patient is on a mattress replacement with low air loss feature.  WOC nursing team will follow, and will remain available to this patient, the nursing and medical teams.  Please re-consult if needed between visits. Thanks, Ladona Mow, MSN, RN, GNP, [place]  Pager# [phone]

## 2020-05-11 NOTE — Progress Notes (Signed)
Patient is very tachypneic at this time. RT drew an ABG. Patient is more acidotic. RT called results to [facility] awaiting further orders.

## 2020-05-11 NOTE — Progress Notes (Signed)
Critical Care Progress Note  NAME:  Alex [name], MRN:  [id number], DOB:  1971/02/07, LOS: 63 ADMISSION DATE:  02/13/2020, CONSULTATION DATE: 02/16/2020 REFERRING MD: Wynona Neat -LBPCCM, CHIEF COMPLAINT: Respiratory failure requiring ECMO  HPI/course in hospital  50 year old man admitted to hospital 12/28 with 1 week history of dyspnea cough nausea and vomiting.  Initially admitted to [facility] long hospital and placed on high flow nasal cannula but rapidly failed and required intubation 12/29.  Persistent hypoxic respiratory failure with PF ratio 55 in spite of 18 of PEEP FiO2 0.1 despite paralytics.  Did not improve with prone ventilation  Cannulated for VV ECMO 12/30 via right IJ crescent cannula.  ECMO circuit was changed on 04/07/2020 Iatrogenic pneumothorax from left subclavian triple-lumen placement  12/28 admitted covid, ARDS 12/30 ECMO cannulation, iatrogenic pneumothrorax, DVT, Bivalrudin started           Left subclavian hematoma, chest tube, ceftriaxone/ azithromycin started 12/31 1Unit PRBC 1/1 Palliative consult, noted HTN, fentanyl only 1/2 Extubation I/O positive, left lung re-expanded, EF 60-65%, Lasix 20mg  BID 1/3 BiPAP in place, HTN to 190s, 200s, labetolol for BP, I/O even, cefepime and vancomycin started 1/4 agitation off BiPAP, possible aspiration 1/5 agitation overnight, reintubated      ECHO with RV dilation, ECMO cannula repositioned, LUE DVT (+), 1/2 dose tPA given,      I/O up, on lasix, Epi, NE, vasopressin started, HR/ rhythm issues noted 1/6 Tracheostomy, hypoglycemic when off tube feeds       Cortrack tune issues 1/7 on lasix gtt 4mg /hr with diuresis, agitation improved 1/8 starting steroid taper 1/9 severe agitation, heavy sedation required, lasix gtt 6mg /hr, I/Os (-)       precedex and fentanyl 1/10 Vanc stopped 1/11 sweep to 2, lasix gtt at 4mg /hr, weight up, metoprolol added for HTN 1/12 fevers to 99 noted, lasix gtt and metazolone 1/13 lasix gtt  dced 1/14 intermittent HTN, possibly flash pulmonary edema tied to sedation        Ischemic left hand changes, 1 Unit PRBCs 1/15 1/16 sweep from 4 to 2.5, chest tube removed 1/17 sweep at 6, trial of nebulized morphine for cough, LUE dopplers show no obstruction 1/18 two events of 2nd degree type II HB noted with coughing         sweep at 4, IV lasix 40, acetazolamide, distal XLT trach placement         agitation and BP spikes 1/19 agitation, air hunger, seroquel, klonopin, oxy, valproate, ketamine trial 1/20 sweep at 3.5, diuresis results in chugging, albumin given         1 Unit PRBCs, lidocaine nebulizers for cough, ancef started 1/21 sweep to 8, anxiety, I/Os (+) with lasix and acetazolamide 1/22 sweep at 5, chest CT bilateral upper lobe PEs 1/23 sweep at 5, delirium, I/Os even with lasix and acetazolamide         continued coughing, 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 1/24 sweep at 7.5, 1 Unit PRBCs, repeat bronchoscopy showed resolution of 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 1/25 sweep at 7.5, on propofol, lasix gtt at 83ml/hr, I/Os (+)         nebulized morphine and lidocaine for cough 1/26 sweep at 8, propofol/ precedex, cough present when weaned, lasix gtt at 46ml/hr        ECMO circuit changes, levophed started, 1 Unit PRBCs 1/27 sweep at 4, off sedation, delirium, I/Os even        lasix gtt restated, acetazolamide overnight 1/28 sweep at 7.5,  HTN labile 1/29 sweep of 6, patient reports a tickle in throat, cetacaine 1/30 sweep down to 6, cough improved with cetacaine and gabapentin 1/31 sweep at 5, agitation, off acetazolamide, lasix, weight up 2/1 cough, of pulmicort, cetacaine for cough, continued gabapentin, dexmethorphan       CT demonstrated trach placement against back of trachea 2/2 Trach collar trial somewhat effective in managing cough       low dose diltiazem for HR,  2/3 sweep at 4,trach cannula replacement (longer, more flexible bivona); brochoscopy  shows irritated mucosa       IV lasix and acetazolamide 2/4 sweep at 5, I/Os (+), BUN elevated, lasix gtt with diamox,       trach decannulation, increased gabapentin for cough 2/5 start HCAP coverage for rising WBC/temps, check Pct/cultures, CXR worse with lack of PEEP, diuresed well but started getting very hypotensive, sepsis vs. Too dry 04/18/20 blood cx grew E faecalis 2/7 PICC change, TEE 2/7>> sweep weans 2/22 Cannula adjustment, circuit change 2/25 up walking in the hallway with physical therapy 2/27 Air entrainment, air embolism  2/28 sweep trial on 0 , planning for decannulation vvECMO   Past Medical History  none  Interim history/subjective:   Remains critically ill. Unresponsive on vent except for eye movements.   Objective   Blood pressure 131/74, pulse (!) 117, temperature 99 F (37.2 C), temperature source Oral, resp. rate (!) 34, height 5\' 4"  (1.626 m), weight 80.6 kg, SpO2 91 %. CVP:  [21 mmHg-24 mmHg] 24 mmHg  Vent Mode: PCV FiO2 (%):  [90 %-100 %] 100 % Set Rate:  [20 bmp] 20 bmp PEEP:  [10 cmH20] 10 cmH20 Pressure Support:  [15 cmH20] 15 cmH20 Plateau Pressure:  [28 cmH20] 28 cmH20   Intake/Output Summary (Last 24 hours) at 05/11/2020 07/11/2020 Last data filed at 05/11/2020 0600 Gross per 24 hour  Intake 2752.64 ml  Output 625 ml  Net 2127.64 ml   Filed Weights   05/07/20 0700 05/08/20 0700 05/11/20 0600  Weight: 77.8 kg 77 kg 80.6 kg    Examination: Constitutional: intubated on mechanical ventilation  Eyes: Sedated   HEENT, prior trach site clear, ETT in place Cardiovascular: RRR, s1 s2  Respiratory: BL vented breaths  Gastrointestinal: soft NT ND  Skin: no rash  Neurologic: withdraws to pain  Ext: ischemia to the left UE distally   Labs reviewed  Chest x-ray 05/11/2020: Diffuse BL airspace disease  The patient's images have been independently reviewed by me.    Assessment & Plan:   Acute hypoxemic/hypercapnic respiratory failure due to severe  ARDS from COVID-19 pneumonia Probable acute PE, and RV dysfunction s/p TPA Refractory coughing- improved after trach removal but still an issue intermittently Strep group F/ enterococcal pneumonia- s/p 10 days vanc 1/29; recurrent with bacteremia on 2/5 now s/p vanc x 14 days R effusion- lymphocyte predominant transudate drained 2/16 05/09/2020: Acute air pulmonary embolism, air entrainment from around catheter insertion site 05/10/2020: De-cannulated from [facility]  Plan: Remains on full vent support  Adult mech vent protocol  Current in pressure control  Lasix drip today   HTN.  Trials of reducing HR/BP have resulted in reduced pCO2 (likely related to fractional flow through ECMO circuit) Plan: holdng coreg   Hyponatremia  Acute encephalopathy, at risk for anoxic brain injury  Acute delirium, with agitation- improved. Plan: Pad guideline sedation  fent and precedex MRI Brain today  EEG possibly needed as well   Sepsis secondary to E faecalis bacteremia- secondary to PNA,  has grown enterococcus in sputum prior, previous vanc course completed 1/29. TEE on 2/7 without vegetations. PICC changed on 2/7.  14 day vanc therapy from PICC change completed 2/21. 05/09/2020 During periarrest event likely significant aspiration. Plan: Broad spectrum abx  De-escalate to zosyn only  Stop vanco   Gastroparesis with some element ileus: improved but occasional vomiting spells with coughing attacks.  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  - wound care following, appreciate dressing recommendations Plan: Avoid left arterial line placements   R arm swelling- duplex neg, NTD here, going to space out cuff measurements since we have arterial line  Hyperglycemia due to diabetes type II -Plan: Goal CBG 140-180  Acute urinary retention- recurrent issue Plan: Foley was replaced    Deconditioning Holding at this time for PT/OT    Daily Goals Checklist  Pain/Anxiety/Delirium protocol (if indicated): see above VAP protocol (if indicated):  n/a DVT prophylaxis: bivalirudin GI prophylaxis: pantoprazole Glucose control: basal bolus insulin Mobility/therapy needs: Mobilization as tolerated Code Status: Full code Disposition: ICU   This patient is critically ill with multiple organ system failure; which, requires frequent high complexity decision making, assessment, support, evaluation, and titration of therapies. This was completed through the application of advanced monitoring technologies and extensive interpretation of multiple databases. During this encounter critical care time was devoted to patient care services described in this note for 32 minutes.   Josephine Igo, DO [facility] 05/11/2020 7:06 AM

## 2020-05-12 ENCOUNTER — Inpatient Hospital Stay (HOSPITAL_COMMUNITY): Payer: Medicaid Other

## 2020-05-12 DIAGNOSIS — R092 Respiratory arrest: Secondary | ICD-10-CM

## 2020-05-12 LAB — GLUCOSE, CAPILLARY
Glucose-Capillary: 228 mg/dL — ABNORMAL HIGH (ref 70–99)
Glucose-Capillary: 200 mg/dL — ABNORMAL HIGH (ref 70–99)
Glucose-Capillary: 275 mg/dL — ABNORMAL HIGH (ref 70–99)

## 2020-05-12 LAB — POCT I-STAT 7, (LYTES, BLD GAS, ICA,H+H)
Acid-Base Excess: 2 mmol/L (ref 0.0–2.0)
Acid-Base Excess: 4 mmol/L — ABNORMAL HIGH (ref 0.0–2.0)
Bicarbonate: 31.5 mmol/L — ABNORMAL HIGH (ref 20.0–28.0)
Bicarbonate: 32.6 mmol/L — ABNORMAL HIGH (ref 20.0–28.0)
Calcium, Ion: 1.2 mmol/L (ref 1.15–1.40)
Calcium, Ion: 1.17 mmol/L (ref 1.15–1.40)
HCT: 27 % — ABNORMAL LOW (ref 39.0–52.0)
HCT: 27 % — ABNORMAL LOW (ref 39.0–52.0)
Hemoglobin: 9.2 g/dL — ABNORMAL LOW (ref 13.0–17.0)
Hemoglobin: 9.2 g/dL — ABNORMAL LOW (ref 13.0–17.0)
O2 Saturation: 90 %
O2 Saturation: 96 %
Patient temperature: 99.7
Patient temperature: 98.4
Potassium: 4.3 mmol/L (ref 3.5–5.1)
Potassium: 4.2 mmol/L (ref 3.5–5.1)
Sodium: 135 mmol/L (ref 135–145)
Sodium: 135 mmol/L (ref 135–145)
TCO2: 34 mmol/L — ABNORMAL HIGH (ref 22–32)
TCO2: 35 mmol/L — ABNORMAL HIGH (ref 22–32)
pCO2 arterial: 83.3 mmHg (ref 32.0–48.0)
pCO2 arterial: 74.1 mmHg (ref 32.0–48.0)
pH, Arterial: 7.19 — CL (ref 7.350–7.450)
pH, Arterial: 7.251 — ABNORMAL LOW (ref 7.350–7.450)
pO2, Arterial: 78 mmHg — ABNORMAL LOW (ref 83.0–108.0)
pO2, Arterial: 95 mmHg (ref 83.0–108.0)

## 2020-05-12 LAB — CBC
HCT: 27.7 % — ABNORMAL LOW (ref 39.0–52.0)
Hemoglobin: 8.9 g/dL — ABNORMAL LOW (ref 13.0–17.0)
MCH: 29.5 pg (ref 26.0–34.0)
MCHC: 32.1 g/dL (ref 30.0–36.0)
MCV: 91.7 fL (ref 80.0–100.0)
Platelets: 244 10*3/uL (ref 150–400)
RBC: 3.02 MIL/uL — ABNORMAL LOW (ref 4.22–5.81)
RDW: 18.1 % — ABNORMAL HIGH (ref 11.5–15.5)
WBC: 11.2 10*3/uL — ABNORMAL HIGH (ref 4.0–10.5)
nRBC: 0 % (ref 0.0–0.2)

## 2020-05-12 LAB — BASIC METABOLIC PANEL
Anion gap: 10 (ref 5–15)
BUN: 49 mg/dL — ABNORMAL HIGH (ref 6–20)
CO2: 30 mmol/L (ref 22–32)
Calcium: 8.4 mg/dL — ABNORMAL LOW (ref 8.9–10.3)
Chloride: 95 mmol/L — ABNORMAL LOW (ref 98–111)
Creatinine, Ser: 1.51 mg/dL — ABNORMAL HIGH (ref 0.61–1.24)
GFR, Estimated: 57 mL/min — ABNORMAL LOW (ref >60)
Glucose, Bld: 244 mg/dL — ABNORMAL HIGH (ref 70–99)
Potassium: 4.2 mmol/L (ref 3.5–5.1)
Sodium: 135 mmol/L (ref 135–145)

## 2020-05-12 LAB — LACTIC ACID, PLASMA: Lactic Acid, Venous: 1.1 mmol/L (ref 0.5–1.9)

## 2020-05-12 LAB — PROTIME-INR
INR: 1.7 — ABNORMAL HIGH (ref 0.8–1.2)
Prothrombin Time: 19.7 seconds — ABNORMAL HIGH (ref 11.4–15.2)

## 2020-05-12 LAB — APTT: aPTT: 74 seconds — ABNORMAL HIGH (ref 24–36)

## 2020-05-12 MED ORDER — MIDAZOLAM 50MG/50ML (1MG/ML) PREMIX INFUSION
0.0000 mg/h | INTRAVENOUS | Status: DC
  Administered 2020-05-13: 01:00:00 0.2 mg/h via INTRAVENOUS
  Administered 2020-05-13: 17:00:00 4 mg/h via INTRAVENOUS
  Administered 2020-05-14: 8 mg/h via INTRAVENOUS
  Administered 2020-05-14: 5 mg/h via INTRAVENOUS
  Filled 2020-05-12 (×4): qty 50

## 2020-05-12 MED ORDER — MIDAZOLAM HCL 2 MG/2ML IJ SOLN
INTRAMUSCULAR | Status: AC
  Filled 2020-05-12: qty 2

## 2020-05-12 MED ORDER — CHLORHEXIDINE GLUCONATE CLOTH 2 % EX PADS: 6.0000 | MEDICATED_PAD | Freq: Every day | CUTANEOUS | Status: DC

## 2020-05-12 MED ORDER — MIDAZOLAM HCL 2 MG/2ML IJ SOLN: 2.0000 mg | INTRAMUSCULAR | Status: DC | PRN

## 2020-05-12 MED ORDER — MIDAZOLAM 50MG/50ML (1MG/ML) PREMIX INFUSION: 2.0000 mg/h | INTRAVENOUS | Status: DC

## 2020-05-12 NOTE — Progress Notes (Signed)
PCCM Goals of Care Discussion and Advanced Care Planning:   Date: 05/12/2020   Present Parties: 3-way telephone call with patient's friend Alex [name] and [name]  What was discussed: Discussed his MRI results concerning for anoxic brain injury  Outcome: Continue current support.  We discussed recommendations for DNR status as well as considerations for transition to comfort care.  They are making arrangements to come to the hospital to see him.  They seem very understanding and thankful for the call.  16 mins of time was spent discussing the goals of care, advanced care planning options such as code status as well as do not resuscitate forms.

## 2020-05-12 NOTE — Progress Notes (Signed)
Patient ID: Alex [name], male   DOB: 07/12/1970, 50 y.o.   MRN: [id number]     Advanced Heart Failure Rounding Note  PCP-Cardiologist: No primary care provider on file.   Subjective:    - 12/30: VV ECMO cannulation - 12/31: Left chest tube replaced - 1/2: Extubated. Echo with EF 60-65%, mildly dilated RV with mildly decreased systolic function.  - 1/4: Agitated, suspected aspiration.  Re-intubated.  - 1/5: ECMO cannula repositioned under TEE guidance. TEE showed moderately dilated/moderate-severely dysfunctional RV in setting of hypoxemia. LUE DVT found.  Patient got 1/2 dose of TPA due to initial concern for large PE.  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  - 1/6: Tracheostomy - 1/7: Echo with mild RV dilation/mild RV dysfunction.  - 1/16: Left chest tube out - 1/17: LUE arterial dopplers repeated, showed no obstruction.  - 1/20: Echo with EF 65-70%, mildly D-shaped septum, mildly dilated and mildly dysfunctional RV.  - 1/22: CTA chest: Bilateral upper lobe PEs (suspect chronic), changes c/w ARDS - 1/23: Bronchoscopy showed semi-occlusive ?mass/polyp in the trachea.  - 1/24: Bronchoscopy showed resolution of 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 - 1/26: ECMO circuit changed.  - 2/1: CT chest showed diffuse bronchiectasis as well as diffuse opacity consistent with COVID-19 PNA with ARDS. - 2/3: Trach exchange - 2/5: Decannulated to HFNC - 2/6: Enterococcal PNA/bacteremia.  Echo showed EF 65-70%, normal-appearing RV, no vegetation noted.  - 2/7: TEE with no vegetation, EF 55-60%, RV low normal function with normal size.  - 2/16: Right thoracentesis 500 cc - 2/22: ECMO circuit changed.  ECMO cannula repositioned under TEE guidance. TEE with EF 60-65%, normal RV.  - 2/27: Massive air entrainment due to leakage at cannula insertion site -> cannula repositioning & resuturing. Arrest with 20 minute downtime. Re-intubated.  - 2/28: ECMO decannulated due to recurrent air entrainment.   CT head w/o acute changes.   MRI head yesterday suggestive of hypoxic/ischemic injury.  He diuresed poorly with Lasix gtt 10 mg/hr, CVP 19 with creatinine 1.5. FiO2 increased to 100%, CXR worse.    Afebrile, he is on vancomycin/Zosyn  ABG 7.25/74/95/96% Hgb 8.9  Objective:   Weight Range: 77.2 kg Body mass index is 29.21 kg/m.   Vital Signs:   Temp:  [98.4 F (36.9 C)-99.3 F (37.4 C)] 98.8 F (37.1 C) (03/02 0841) Pulse Rate:  [110-124] 121 (03/02 0806) Resp:  [20-37] 37 (03/02 0806) BP: (114-162)/(55-84) 162/75 (03/02 0806) SpO2:  [89 %-100 %] 98 % (03/02 0806) Arterial Line BP: (96-159)/(51-72) 102/54 (03/02 0800) FiO2 (%):  [90 %-100 %] 100 % (03/02 0806) Weight:  [77.2 kg] 77.2 kg (03/02 0600) Last BM Date: 05/11/20  Weight change: Filed Weights   05/08/20 0700 05/11/20 0600 05/12/20 0600  Weight: 77 kg 80.6 kg 77.2 kg    Intake/Output:   Intake/Output Summary (Last 24 hours) at 05/12/2020 0918 Last data filed at 05/12/2020 0800 Gross per 24 hour  Intake 1447.75 ml  Output 1720 ml  Net -272.25 ml      Physical Exam    General: Intubated/sedated.  Neck: JVP 14+, no thyromegaly or thyroid nodule.  Lungs: Decreased bilaterally.  CV: Nondisplaced PMI.  Heart regular S1/S2, no S3/S4, no murmur.  1+ ankle edema.  Abdomen: Soft, nontender, no hepatosplenomegaly, no distention.  Skin: Intact without lesions or rashes.  Neurologic:Sedated.  Extremities: No clubbing or cyanosis.  HEENT: Normal.    Telemetry   Sinus tachy, 110s. Personally reviewed  Labs    CBC  Recent Labs    05/11/20 1749 05/11/20 2114 05/12/20 0346 05/12/20 0501 05/12/20 0753  WBC 10.6*  --  11.2*  --   --   HGB 9.2*   < > 8.9* 9.2* 9.2*  HCT 29.1*   < > 27.7* 27.0* 27.0*  MCV 90.4  --  91.7  --   --   PLT 228  --  244  --   --    < > = values in this interval not displayed.   Basic Metabolic Panel Recent Labs    05/11/20 1749 05/11/20 2114 05/12/20 0346 05/12/20 0501  05/12/20 0753  NA 134*   < > 135 135 135  K 4.0   < > 4.2 4.3 4.2  CL 94*  --  95*  --   --   CO2 28  --  30  --   --   GLUCOSE 238*  --  244*  --   --   BUN 44*  --  49*  --   --   CREATININE 1.34*  --  1.51*  --   --   CALCIUM 8.3*  --  8.4*  --   --    < > = values in this interval not displayed.   Liver Function Tests No results for input(s): AST, ALT, ALKPHOS, BILITOT, PROT, ALBUMIN in the last 72 hours. No results for input(s): LIPASE, AMYLASE in the last 72 hours. Cardiac Enzymes No results for input(s): CKTOTAL, CKMB, CKMBINDEX, TROPONINI in the last 72 hours.  BNP: BNP (last 3 results) No results for input(s): BNP in the last 8760 hours.  ProBNP (last 3 results) No results for input(s): PROBNP in the last 8760 hours.   D-Dimer No results for input(s): DDIMER in the last 72 hours. Hemoglobin A1C No results for input(s): HGBA1C in the last 72 hours. Fasting Lipid Panel No results for input(s): CHOL, HDL, LDLCALC, TRIG, CHOLHDL, LDLDIRECT in the last 72 hours. Thyroid Function Tests No results for input(s): TSH, T4TOTAL, T3FREE, THYROIDAB in the last 72 hours.  Invalid input(s): FREET3  Other results:   Imaging    MR BRAIN W WO CONTRAST  Result Date: 05/11/2020 CLINICAL DATA:  Encephalopathy EXAM: MRI HEAD WITHOUT AND WITH CONTRAST TECHNIQUE: Multiplanar, multiecho pulse sequences of the brain and surrounding structures were obtained without and with intravenous contrast. CONTRAST:  28m GADAVIST GADOBUTROL 1 MMOL/ML IV SOLN COMPARISON:  None. FINDINGS: Brain: Multifocal mildly reduced diffusion including involvement of the caudate nuclei bilaterally, bilateral frontoparietal and occipital cortex with some subcortical involvement, and cerebellum bilaterally. No evidence hemorrhage. Minimal corresponding enhancement in the left frontal lobe. No mass, mass effect, hydrocephalus, or extra-axial collection. Vascular: Major vessel flow voids at the skull base are  preserved. Skull and upper cervical spine: Decreased T1 marrow signal probably reflects anemia. Sinuses/Orbits: Diffuse paranasal sinus mucosal thickening with nonspecific left maxillary sinus air-fluid level. Orbits are unremarkable. Other: Sella is unremarkable.  Bilateral mastoid effusions. IMPRESSION: Multifocal abnormal signal likely reflecting hypoxic/ischemic injury. Electronically Signed   By: PMacy MisM.D.   On: 05/11/2020 13:19   DG CHEST PORT 1 VIEW  Result Date: 05/12/2020 CLINICAL DATA:  COVID-19, recent ECMO, intubation, cardia respiratory failure EXAM: PORTABLE CHEST 1 VIEW COMPARISON:  Portable exam 0546 hours compared to 05/11/2020 FINDINGS: Tip of endotracheal tube projects 2.7 cm above carina. Feeding tube extends into stomach. RIGHT arm PICC line tip projects over SVC. Borderline enlargement of cardiac silhouette. Severe diffuse BILATERAL pulmonary infiltrates which may represent multifocal  pneumonia/ COVID-19 with or without superimposed ARDS. No pleural effusion or pneumothorax. IMPRESSION: Extensive BILATERAL pulmonary infiltrates consistent with multifocal pneumonia increased since previous exam, with or without superimposed ARDS. Electronically Signed   By: Lavonia Dana M.D.   On: 05/12/2020 08:19   EEG adult  Result Date: 05/12/2020 Lora Havens, MD     05/12/2020  8:32 AM Patient Name: Zaniel Marineau MRN: [id number] Epilepsy Attending: Lora Havens Referring Physician/Provider: Dr June Leap Date: 05/11/2020 Duration: 22.06 mins Patient history: 50 year old male with altered mental status.  EEG to evaluate for seizures. Level of alertness: comatose AEDs during EEG study: Gabapentin, Klonopin Technical aspects: This EEG study was done with scalp electrodes positioned according to the 10-20 International system of electrode placement. Electrical activity was acquired at a sampling rate of '500Hz'  and reviewed with a high frequency filter of '70Hz'  and a low frequency filter of  '1Hz' . EEG data were recorded continuously and digitally stored. Description: EEG showed continuous generalized 3 to 6 Hz theta-delta slowing. Hyperventilation and photic stimulation were not performed.   ABNORMALITY -Continuous slow, generalized IMPRESSION: This study is suggestive of severe diffuse encephalopathy, nonspecific etiology. No seizures or epileptiform discharges were seen throughout the recording. Priyanka Barbra Sarks     Medications:     Scheduled Medications: . bethanechol  10 mg Per Tube TID  . chlorhexidine gluconate (MEDLINE KIT)  15 mL Mouth Rinse BID  . Chlorhexidine Gluconate Cloth  6 each Topical Daily  . chlorpheniramine-HYDROcodone  5 mL Per Tube Q12H  . clonazePAM  0.5 mg Per Tube Daily  . clonazePAM  1 mg Per Tube QHS  . dextromethorphan  30 mg Per Tube TID  . docusate  100 mg Per Tube BID  . gabapentin  300 mg Per Tube Q12H  . insulin aspart  0-20 Units Subcutaneous Q4H  . ipratropium-albuterol  3 mL Nebulization BID  . mouth rinse  15 mL Mouth Rinse 10 times per day  . melatonin  5 mg Per Tube QHS  . metoCLOPramide (REGLAN) injection  10 mg Intravenous Q8H  . multivitamin  15 mL Per Tube Daily  . pantoprazole sodium  40 mg Per Tube QHS  . QUEtiapine  50 mg Per Tube QHS  . sennosides  5 mL Per Tube QHS  . sertraline  25 mg Per Tube Daily  . sodium chloride flush  10-40 mL Intracatheter Q12H    Infusions: . sodium chloride    . sodium chloride Stopped (05/12/20 0756)  . albumin human Stopped (04/23/20 0041)  . bivalirudin (ANGIOMAX) infusion 0.5 mg/mL (Non-ACS indications) 0.055 mg/kg/hr (05/12/20 0800)  . dexmedetomidine (PRECEDEX) IV infusion Stopped (05/11/20 0432)  . feeding supplement (PIVOT 1.5 CAL) 1,000 mL (05/12/20 0619)  . fentaNYL infusion INTRAVENOUS 100 mcg/hr (05/12/20 0800)  . furosemide (LASIX) 200 mg in dextrose 5% 100 mL (10m/mL) infusion 10 mg/hr (05/12/20 0116)  . norepinephrine (LEVOPHED) Adult infusion Stopped (05/11/20 1236)  .  piperacillin-tazobactam (ZOSYN)  IV 3.375 g (05/12/20 0831)    PRN Medications: Place/Maintain arterial line **AND** sodium chloride, acetaminophen (TYLENOL) oral liquid 160 mg/5 mL, albumin human, [DISCONTINUED] lidocaine **AND** albuterol, clonazePAM, guaiFENesin, hydrALAZINE, labetalol, lip balm, ondansetron (ZOFRAN) IV, oxyCODONE, phenol, polyethylene glycol, promethazine-codeine, simethicone, sodium chloride, sodium chloride flush   Assessment/Plan   1. Acute hypoxemic respiratory failure: Due to COVID-19 PNA with bilateral infiltrates.  Refractory hypoxemia, VV-ECMO cannulation on 02/28/2020 with improvement in oxygenation.  Developed left PTX post-subclavian CVL and had left chest tube, the left lung  is re-expanded and CT out.  He was extubated 1/2 but reintubated 1/4 with agitation and suspected aspiration.  Tracheostomy 1/6.  CTA chest 1/22 with suspected chronic PEs and ARDS. ECMO cannula repositioned 1/5. ECMO circuit changed 1/26. Extubated to HFNC on 2/5. ECMO circuit changed and cannula repositioned 2/22.  LDH stable. Has struggled with hypercarbia from significant dead space ventilation. Had massive air entrainment 2/27 due to leakage at ECMO cannula insertion site, had PEA arrest with 20 minute downtime approximately. Cannula repositioned and resecured but still with some air entrainment with movement. ECMO decannulated on 2/28.  He is back on full vent.  CVP 19.  Poor diuresis, ABG worse, CXR worse, FiO2 up to 100%.   - Cardiac MRI suggestive of severe hypoxic encephalopathy.  With worsening clinical picture, Dr. Valeta Harms has talked to family about comfort care.  - Continue Lasix 10 mg/hr for now with volume overload.  - Patient has had remdesivir, tocilizumab, steroids for COVID-19. - Due to lack of insurance, lung transplant has not been an option.  2. 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  Clot noted on TEE in RV as  well.  TTE 1/2 showed normal EF 60-65%, RV improved (mildly dilated/dysfunctional). TEE on 1/5 with moderate to severe RV dysfunction but patient was hypoxemic.  Had 1/2 dose TPA on 1/5. Echo 1/20 with mildly dilated/mildly dysfunctional RV. CTA chest 1/22 with chronic-appearing PEs in upper lobes. - on Bivalirudin. 3. Left PTX: Left chest tube, lung is re-expanded. Tube now out, stable CXR. 4. Shock: Suspect septic/distributive. He is off NE now.  5. Anemia: Hgb 8.9, transfuse < 7.5.  6. AKI: Creatinine up to 1.5 with poor UOP.  Lasix gtt as above.  7. Hyperglycemia: insulin.  8. HTN: Controlled.     9. CHB: Episode of CHB when hypoxemic and with cough (suspect vagal).  NSR since then.   He has occasional short vagal episodes.  10. Thrombocytopenia: Resolved  11. FEN: He is eating well, tube feeds off and Cortrack out.  12. Ischemic digits: LUE.  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  Repeat study 1/17 showed no obstruction.  - Wound Care following. 13. ID: Group F Strep and Enterococcus faecalis in sputum. Initially completed abx for these bacteria. Now with recurrent sepsis, Enterococcus faecalis in blood now. Vancomycin restarted and continued to 2/21. TEE 2/7 with no vegetation.  - With 2/27 event, vancomycin/Zosyn were restarted.  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   - resolved 16. Neuro: Head MRI and EEG suggest anoxic encephalopathy.    Dr. Valeta Harms has spoken to family, plan for comfort care at this time with anoxic encephalopathy from episode over weekend and worsening respiratory status.   CRITICAL CARE Performed by: Loralie Champagne  Total critical care time: 35 minutes  Critical care time was exclusive of separately billable procedures and treating other patients.  Critical care was necessary to treat or prevent imminent or life-threatening  deterioration.  Critical care was time spent personally by me on the following activities: development of treatment plan with patient and/or surrogate as well as nursing, discussions with consultants, evaluation of patient's response to treatment, examination of patient, obtaining history from patient or surrogate, ordering and performing treatments and interventions, ordering and review of laboratory studies, ordering and review of radiographic studies, pulse oximetry and re-evaluation of patient's condition.    Length  of Stay: [place], MD  05/12/2020, 9:18 AM  Advanced Heart Failure Team Pager [phone] (M-F; [place])  Please contact [facility] for night-coverage after hours (4p -7a ) and weekends on [email or web address]

## 2020-05-12 NOTE — Progress Notes (Signed)
PCCM:   I called and spoke with Sharia Reeve and his mother. All questions answered. They are looking to get as many people to visit him as possible.   Orders placed for DDNR status and comfort care measures.   Patient to remain on ventilator until family has had time to visit.   Josephine Igo, DO [facility] 05/12/2020 1:34 PM

## 2020-05-12 NOTE — Progress Notes (Signed)
ANTICOAGULATION CONSULT NOTE  Pharmacy Consult for bivalirudin Indication: ECMO and VTE  Labs: Recent Labs    05/10/20 0337 05/10/20 0340 05/11/20 0410 05/11/20 1749 05/11/20 2114 05/12/20 0346 05/12/20 0501  HGB 8.9*   < > 9.3* 9.2* 9.2* 8.9* 9.2*  HCT 26.5*   < > 28.8* 29.1* 27.0* 27.7* 27.0*  PLT 183   < > 182 228  --  244  --   APTT 76*   < > 82* 83*  --  74*  --   LABPROT 21.1*  --  21.5*  --   --  19.7*  --   INR 1.9*  --  2.0*  --   --  1.7*  --   CREATININE 0.88   < > 1.41* 1.34*  --  1.51*  --    < > = values in this interval not displayed.    Assessment: 50 yoM admitted with COVID-19 PNA with worsening hypoxia, s/p cannulation for ECMO. Pt was started on IV heparin prior to cannulation due to acute DVTs and possible PE, transitioned to bivalirudin with ECMO. S/p tPA on 1/5 and tracheostomy on 1/6. ECMO circuit changed on 05/03/2020. ECMO decanulated 2/28, continuing bivalirudin with DVTs and heparin shortage.  APTT now therapeutic at 72 seconds, CBC stable.  Goal of Therapy:  aPTT 60-80 seconds   Plan:  -Continue bivalirudin 0.055 mg/kg/h -Will begin daily aPTT monitoring   Fredonia Highland, PharmD, BCPS, [facility] Clinical Pharmacist [phone] Please check AMION for all [facility] numbers 05/12/2020

## 2020-05-12 NOTE — Progress Notes (Signed)
Critical Care Progress Note  NAME:  Crews Mccollam, MRN:  [id number], DOB:  09-26-1970, LOS: 64 ADMISSION DATE:  02/11/2020, CONSULTATION DATE: 03/11/2020 REFERRING MD: Wynona Neat -LBPCCM, CHIEF COMPLAINT: Respiratory failure requiring ECMO  HPI/course in hospital  50 year old man admitted to hospital 12/28 with 1 week history of dyspnea cough nausea and vomiting.  Initially admitted to [facility] long hospital and placed on high flow nasal cannula but rapidly failed and required intubation 12/29.  Persistent hypoxic respiratory failure with PF ratio 55 in spite of 18 of PEEP FiO2 0.1 despite paralytics.  Did not improve with prone ventilation  Cannulated for VV ECMO 12/30 via right IJ crescent cannula.  ECMO circuit was changed on 04/07/2020 Iatrogenic pneumothorax from left subclavian triple-lumen placement  12/28 admitted covid, ARDS 12/30 ECMO cannulation, iatrogenic pneumothrorax, DVT, Bivalrudin started           Left subclavian hematoma, chest tube, ceftriaxone/ azithromycin started 12/31 1Unit PRBC 1/1 Palliative consult, noted HTN, fentanyl only 1/2 Extubation I/O positive, left lung re-expanded, EF 60-65%, Lasix 20mg  BID 1/3 BiPAP in place, HTN to 190s, 200s, labetolol for BP, I/O even, cefepime and vancomycin started 1/4 agitation off BiPAP, possible aspiration 1/5 agitation overnight, reintubated      ECHO with RV dilation, ECMO cannula repositioned, LUE DVT (+), 1/2 dose tPA given,      I/O up, on lasix, Epi, NE, vasopressin started, HR/ rhythm issues noted 1/6 Tracheostomy, hypoglycemic when off tube feeds       Cortrack tune issues 1/7 on lasix gtt 4mg /hr with diuresis, agitation improved 1/8 starting steroid taper 1/9 severe agitation, heavy sedation required, lasix gtt 6mg /hr, I/Os (-)       precedex and fentanyl 1/10 Vanc stopped 1/11 sweep to 2, lasix gtt at 4mg /hr, weight up, metoprolol added for HTN 1/12 fevers to 99 noted, lasix gtt and metazolone 1/13 lasix gtt  dced 1/14 intermittent HTN, possibly flash pulmonary edema tied to sedation        Ischemic left hand changes, 1 Unit PRBCs 1/15 1/16 sweep from 4 to 2.5, chest tube removed 1/17 sweep at 6, trial of nebulized morphine for cough, LUE dopplers show no obstruction 1/18 two events of 2nd degree type II HB noted with coughing         sweep at 4, IV lasix 40, acetazolamide, distal XLT trach placement         agitation and BP spikes 1/19 agitation, air hunger, seroquel, klonopin, oxy, valproate, ketamine trial 1/20 sweep at 3.5, diuresis results in chugging, albumin given         1 Unit PRBCs, lidocaine nebulizers for cough, ancef started 1/21 sweep to 8, anxiety, I/Os (+) with lasix and acetazolamide 1/22 sweep at 5, chest CT bilateral upper lobe PEs 1/23 sweep at 5, delirium, I/Os even with lasix and acetazolamide         continued coughing, 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 1/24 sweep at 7.5, 1 Unit PRBCs, repeat bronchoscopy showed resolution of 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 1/25 sweep at 7.5, on propofol, lasix gtt at 66ml/hr, I/Os (+)         nebulized morphine and lidocaine for cough 1/26 sweep at 8, propofol/ precedex, cough present when weaned, lasix gtt at 64ml/hr        ECMO circuit changes, levophed started, 1 Unit PRBCs 1/27 sweep at 4, off sedation, delirium, I/Os even        lasix gtt restated, acetazolamide overnight 1/28 sweep at 7.5,  HTN labile 1/29 sweep of 6, patient reports a tickle in throat, cetacaine 1/30 sweep down to 6, cough improved with cetacaine and gabapentin 1/31 sweep at 5, agitation, off acetazolamide, lasix, weight up 2/1 cough, of pulmicort, cetacaine for cough, continued gabapentin, dexmethorphan       CT demonstrated trach placement against back of trachea 2/2 Trach collar trial somewhat effective in managing cough       low dose diltiazem for HR,  2/3 sweep at 4,trach cannula replacement (longer, more flexible bivona); brochoscopy  shows irritated mucosa       IV lasix and acetazolamide 2/4 sweep at 5, I/Os (+), BUN elevated, lasix gtt with diamox,       trach decannulation, increased gabapentin for cough 2/5 start HCAP coverage for rising WBC/temps, check Pct/cultures, CXR worse with lack of PEEP, diuresed well but started getting very hypotensive, sepsis vs. Too dry 04/18/20 blood cx grew E faecalis 2/7 PICC change, TEE 2/7>> sweep weans 2/22 Cannula adjustment, circuit change 2/25 up walking in the hallway with physical therapy 2/27 Air entrainment, air embolism  2/28 sweep trial on 0 , planning for decannulation vvECMO 3/1 MRI anoxic brain injury    Past Medical History  none  Interim history/subjective:   Remains critically ill.  Intubated mechanical life support  Objective   Blood pressure (!) 143/83, pulse (!) 118, temperature 98.5 F (36.9 C), temperature source Oral, resp. rate (!) 28, height 5\' 4"  (1.626 m), weight 77.2 kg, SpO2 98 %. CVP:  [21 mmHg-25 mmHg] 25 mmHg  Vent Mode: PRVC FiO2 (%):  [90 %-100 %] 100 % Set Rate:  [20 bmp-32 bmp] 32 bmp Vt Set:  [470 mL] 470 mL PEEP:  [10 cmH20] 10 cmH20 Plateau Pressure:  [28 cmH20-32 cmH20] 32 cmH20   Intake/Output Summary (Last 24 hours) at 05/12/2020 0657 Last data filed at 05/12/2020 0600 Gross per 24 hour  Intake 1458.67 ml  Output 1760 ml  Net -301.33 ml   Filed Weights   05/08/20 0700 05/11/20 0600 05/12/20 0600  Weight: 77 kg 80.6 kg 77.2 kg    Examination: Constitutional: intubated, critically ill, on mechanical life support  Eyes: unresponsive   HEENT: ETT in place  Cardiovascular: RRR, s1 s2  Respiratory: intubated on life support  Gastrointestinal:  Skin: no rash  Neurologic: withdraws to pain  Ext: ischemia to the left UE distally   Labs reviewed  Chest x-ray 05/11/2020: Diffuse BL airspace disease  The patient's images have been independently reviewed by me.    Assessment & Plan:   Anoxic brain injury Acute  encephalopathy,  Acute delirium, with agitation. Plan: Continue PAD guidelines sedation MRI brain results reviewed with patient's family EEG results reviewed diffuse encephalopathy  Acute hypoxemic/hypercapnic respiratory failure due to severe ARDS from COVID-19 pneumonia Probable acute PE, and RV dysfunction s/p TPA Refractory coughing- improved after trach removal but still an issue intermittently Strep group F/ enterococcal pneumonia- s/p 10 days vanc 1/29; recurrent with bacteremia on 2/5 now s/p vanc x 14 days R effusion- lymphocyte predominant transudate drained 2/16 05/09/2020: Acute air pulmonary embolism, air entrainment from around catheter insertion site 05/10/2020: De-cannulated from [facility]  Plan: Remains on full mechanical vent support not diuresing well. Discussed with cardiology. I think next best steps would be consideration for transition to comfort care measures. Had a long discussion with patient's son and friend via phone today I will reach out to palliative care team as well.  HTN.  Trials of reducing HR/BP have resulted  in reduced pCO2 (likely related to fractional flow through ECMO circuit) Plan: Hold Coreg  Hyponatremia  Sepsis secondary to E faecalis bacteremia- secondary to PNA, has grown enterococcus in sputum prior, previous vanc course completed 1/29. TEE on 2/7 without vegetations. PICC changed on 2/7.  14 day vanc therapy from PICC change completed 2/21. 05/09/2020 During periarrest event likely significant aspiration. Plan: De-escalate to Zosyn only  Gastroparesis with some element ileus: improved but occasional vomiting spells with coughing attacks.  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  - wound care following, appreciate dressing recommendations Plan: Avoid left arterial line placements   R arm swelling- duplex neg, NTD here, going to space out cuff  measurements since we have arterial line  Hyperglycemia due to diabetes type II -Plan: Goal CBG 140-180  Acute urinary retention- recurrent issue Plan: Foley follow urine output  Deconditioning holding PT OT at this time   Daily Goals Checklist  Pain/Anxiety/Delirium protocol (if indicated): see above VAP protocol (if indicated):  n/a DVT prophylaxis: bivalirudin GI prophylaxis: pantoprazole Glucose control: basal bolus insulin Mobility/therapy needs: Mobilization as tolerated Code Status: Full code Disposition: ICU  This patient is critically ill with multiple organ system failure; which, requires frequent high complexity decision making, assessment, support, evaluation, and titration of therapies. This was completed through the application of advanced monitoring technologies and extensive interpretation of multiple databases. During this encounter critical care time was devoted to patient care services described in this note for 35 minutes.  Josephine Igo, DO [facility] 05/12/2020 6:57 AM     Please call AMION

## 2020-05-12 NOTE — Progress Notes (Signed)
eLink Physician-Brief Progress Note Patient Name: Alex [name] DOB: April 18, 1970 MRN: [id number]   Date of Service  05/12/2020  HPI/Events of Note  ABG 100%/PC 20/Rate 32/P 10 = 7.19/83.3/78  eICU Interventions  Plan: 1. Change ventilator settings to 100%/PRVC 32/TV 470/P 10. 2. Repeat ABG at 7:30 AM.     Intervention Category Major Interventions: Respiratory failure - evaluation and management;Acid-Base disturbance - evaluation and management  Sommer,Steven Eugene 05/12/2020, 5:22 AM

## 2020-05-12 NOTE — Procedures (Signed)
Patient Name: Alex [name]  MRN: [id number]  Epilepsy Attending: Charlsie Quest  Referring Physician/Provider: Dr Audie Box Date: 05/11/2020 Duration: 22.06 mins  Patient history: 50 year old male with altered mental status.  EEG to evaluate for seizures.  Level of alertness: comatose  AEDs during EEG study: Gabapentin, Klonopin  Technical aspects: This EEG study was done with scalp electrodes positioned according to the 10-20 International system of electrode placement. Electrical activity was acquired at a sampling rate of 500Hz  and reviewed with a high frequency filter of 70Hz  and a low frequency filter of 1Hz . EEG data were recorded continuously and digitally stored.   Description: EEG showed continuous generalized 3 to 6 Hz theta-delta slowing. Hyperventilation and photic stimulation were not performed.     ABNORMALITY -Continuous slow, generalized  IMPRESSION: This study is suggestive of severe diffuse encephalopathy, nonspecific etiology. No seizures or epileptiform discharges were seen throughout the recording.  Kayah Hecker 

## 2020-05-12 NOTE — Plan of Care (Signed)
?  Problem: Clinical Measurements: ?Goal: Will remain free from infection ?Outcome: Progressing ?  ?

## 2020-05-12 NOTE — Plan of Care (Signed)
  Problem: Health Behavior/Discharge Planning: Goal: Ability to manage health-related needs will improve Outcome: Not Progressing   Problem: Clinical Measurements: Goal: Ability to maintain clinical measurements within normal limits will improve Outcome: Progressing Goal: Will remain free from infection Outcome: Progressing Goal: Diagnostic test results will improve Outcome: Not Progressing Goal: Respiratory complications will improve Outcome: Not Progressing Goal: Cardiovascular complication will be avoided Outcome: Progressing   Problem: Activity: Goal: Risk for activity intolerance will decrease Outcome: Not Progressing   Problem: Coping: Goal: Level of anxiety will decrease Outcome: Progressing   Problem: Pain Managment: Goal: General experience of comfort will improve Outcome: Progressing   Problem: Elimination: Goal: Will not experience complications related to bowel motility Outcome: Progressing Goal: Will not experience complications related to urinary retention Outcome: Progressing   Problem: Safety: Goal: Ability to remain free from injury will improve Outcome: Progressing   Problem: Skin Integrity: Goal: Risk for impaired skin integrity will decrease Outcome: Progressing   Problem: Education: Goal: Knowledge of risk factors and measures for prevention of condition will improve Outcome: Not Progressing   Problem: Coping: Goal: Psychosocial and spiritual needs will be supported Outcome: Progressing   Problem: Respiratory: Goal: Will maintain a patent airway Outcome: Progressing Goal: Complications related to the disease process, condition or treatment will be avoided or minimized Outcome: Not Progressing

## 2020-05-13 ENCOUNTER — Inpatient Hospital Stay (HOSPITAL_COMMUNITY): Payer: Medicaid Other

## 2020-05-13 DIAGNOSIS — B952 Enterococcus as the cause of diseases classified elsewhere: Secondary | ICD-10-CM

## 2020-05-13 DIAGNOSIS — J9601 Acute respiratory failure with hypoxia: Secondary | ICD-10-CM

## 2020-05-13 DIAGNOSIS — R7881 Bacteremia: Secondary | ICD-10-CM

## 2020-05-13 DIAGNOSIS — J80 Acute respiratory distress syndrome: Secondary | ICD-10-CM

## 2020-05-13 LAB — URINALYSIS, ROUTINE W REFLEX MICROSCOPIC
Bilirubin Urine: NEGATIVE
Glucose, UA: NEGATIVE mg/dL
Hgb urine dipstick: NEGATIVE
Ketones, ur: NEGATIVE mg/dL
Leukocytes,Ua: NEGATIVE
Nitrite: NEGATIVE
Protein, ur: 30 mg/dL — AB
Specific Gravity, Urine: 1.017 (ref 1.005–1.030)
pH: 5 (ref 5.0–8.0)

## 2020-05-13 LAB — CBC
HCT: 30 % — ABNORMAL LOW (ref 39.0–52.0)
Hemoglobin: 8.6 g/dL — ABNORMAL LOW (ref 13.0–17.0)
MCH: 28 pg (ref 26.0–34.0)
MCHC: 28.7 g/dL — ABNORMAL LOW (ref 30.0–36.0)
MCV: 97.7 fL (ref 80.0–100.0)
Platelets: 334 10*3/uL (ref 150–400)
RBC: 3.07 MIL/uL — ABNORMAL LOW (ref 4.22–5.81)
RDW: 18.4 % — ABNORMAL HIGH (ref 11.5–15.5)
WBC: 9.8 10*3/uL (ref 4.0–10.5)
nRBC: 0 % (ref 0.0–0.2)

## 2020-05-13 LAB — GLUCOSE, CAPILLARY: Glucose-Capillary: 221 mg/dL — ABNORMAL HIGH (ref 70–99)

## 2020-05-13 LAB — SARS CORONAVIRUS 2 BY RT PCR (HOSPITAL ORDER, PERFORMED IN ~~LOC~~ HOSPITAL LAB): SARS Coronavirus 2: POSITIVE — AB

## 2020-05-13 LAB — BASIC METABOLIC PANEL
Anion gap: 13 (ref 5–15)
BUN: 81 mg/dL — ABNORMAL HIGH (ref 6–20)
CO2: 28 mmol/L (ref 22–32)
Calcium: 9 mg/dL (ref 8.9–10.3)
Chloride: 96 mmol/L — ABNORMAL LOW (ref 98–111)
Creatinine, Ser: 2.01 mg/dL — ABNORMAL HIGH (ref 0.61–1.24)
GFR, Estimated: 40 mL/min — ABNORMAL LOW (ref >60)
Glucose, Bld: 207 mg/dL — ABNORMAL HIGH (ref 70–99)
Potassium: 4.8 mmol/L (ref 3.5–5.1)
Sodium: 137 mmol/L (ref 135–145)

## 2020-05-13 LAB — CK: Total CK: 25 U/L — ABNORMAL LOW (ref 49–397)

## 2020-05-13 LAB — POCT I-STAT 7, (LYTES, BLD GAS, ICA,H+H)
Acid-Base Excess: 1 mmol/L (ref 0.0–2.0)
Bicarbonate: 31.7 mmol/L — ABNORMAL HIGH (ref 20.0–28.0)
Calcium, Ion: 1.27 mmol/L (ref 1.15–1.40)
HCT: 28 % — ABNORMAL LOW (ref 39.0–52.0)
Hemoglobin: 9.5 g/dL — ABNORMAL LOW (ref 13.0–17.0)
O2 Saturation: 99 %
Potassium: 4.9 mmol/L (ref 3.5–5.1)
Sodium: 137 mmol/L (ref 135–145)
TCO2: 34 mmol/L — ABNORMAL HIGH (ref 22–32)
pCO2 arterial: 89.3 mmHg (ref 32.0–48.0)
pH, Arterial: 7.158 — CL (ref 7.350–7.450)
pO2, Arterial: 186 mmHg — ABNORMAL HIGH (ref 83.0–108.0)

## 2020-05-13 LAB — LACTATE DEHYDROGENASE: LDH: 379 U/L — ABNORMAL HIGH (ref 98–192)

## 2020-05-13 LAB — FIBRINOGEN: Fibrinogen: 698 mg/dL — ABNORMAL HIGH (ref 210–475)

## 2020-05-13 LAB — GAMMA GT: GGT: 284 U/L — ABNORMAL HIGH (ref 7–50)

## 2020-05-13 LAB — LACTIC ACID, PLASMA: Lactic Acid, Venous: 1.2 mmol/L (ref 0.5–1.9)

## 2020-05-13 LAB — D-DIMER, QUANTITATIVE: D-Dimer, Quant: 10.94 ug/mL-FEU — ABNORMAL HIGH (ref 0.00–0.50)

## 2020-05-13 LAB — APTT: aPTT: 39 seconds — ABNORMAL HIGH (ref 24–36)

## 2020-05-13 MED ORDER — SODIUM CHLORIDE 0.9 % IV SOLN
1.0000 g | Freq: Two times a day (BID) | INTRAVENOUS | Status: DC
  Administered 2020-05-13: 1 g via INTRAVENOUS
  Filled 2020-05-13 (×3): qty 10

## 2020-05-13 MED ORDER — MORPHINE SULFATE (PF) 4 MG/ML IV SOLN: 4.0000 mg | INTRAVENOUS | Status: DC | PRN

## 2020-05-13 MED ORDER — PLASMA-LYTE A IV SOLN: INTRAVENOUS | Status: DC

## 2020-05-13 MED ORDER — MORPHINE 100MG IN NS 100ML (1MG/ML) PREMIX INFUSION
2.0000 mg/h | INTRAVENOUS | Status: DC
  Administered 2020-05-13: 2 mg/h via INTRAVENOUS
  Administered 2020-05-14 (×2): 10 mg/h via INTRAVENOUS
  Filled 2020-05-13 (×4): qty 100

## 2020-05-13 MED ORDER — INSULIN ASPART 100 UNIT/ML ~~LOC~~ SOLN
0.0000 [IU] | SUBCUTANEOUS | Status: DC
  Administered 2020-05-13: 7 [IU] via SUBCUTANEOUS

## 2020-05-13 MED ORDER — CEFTRIAXONE SODIUM 1 G IJ SOLR: 1.0000 g | Freq: Two times a day (BID) | INTRAMUSCULAR | Status: DC

## 2020-05-13 NOTE — Progress Notes (Signed)
PT Cancellation Note  Patient Details Name: Alex [name] MRN: [id number] DOB: 10-16-1970   Cancelled Treatment:    Reason Eval/Treat Not Completed: (P) Medical issues which prohibited therapy Pt has been made DNR and placed on comfort care. It has been an honor to have served as his PT over his journey at [facility]. I wish him peace. Thanks Psychologist, occupational.   Elizabeth B. Beverely Risen PT, DPT Acute Rehabilitation Services Pager [phone] Office [phone]    Elon Alas Fleet 05/13/2020, 10:13 AM

## 2020-05-13 NOTE — Progress Notes (Signed)
Patient ID: Alex [name], male   DOB: 1971-01-02, 50 y.o.   MRN: [id number]     Advanced Heart Failure Rounding Note  PCP-Cardiologist: No primary care provider on file.   Subjective:    - 12/30: VV ECMO cannulation - 12/31: Left chest tube replaced - 1/2: Extubated. Echo with EF 60-65%, mildly dilated RV with mildly decreased systolic function.  - 1/4: Agitated, suspected aspiration.  Re-intubated.  - 1/5: ECMO cannula repositioned under TEE guidance. TEE showed moderately dilated/moderate-severely dysfunctional RV in setting of hypoxemia. LUE DVT found.  Patient got 1/2 dose of TPA due to initial concern for large PE.  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  - 1/6: Tracheostomy - 1/7: Echo with mild RV dilation/mild RV dysfunction.  - 1/16: Left chest tube out - 1/17: LUE arterial dopplers repeated, showed no obstruction.  - 1/20: Echo with EF 65-70%, mildly D-shaped septum, mildly dilated and mildly dysfunctional RV.  - 1/22: CTA chest: Bilateral upper lobe PEs (suspect chronic), changes c/w ARDS - 1/23: Bronchoscopy showed semi-occlusive ?mass/polyp in the trachea.  - 1/24: Bronchoscopy showed resolution of 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 - 1/26: ECMO circuit changed.  - 2/1: CT chest showed diffuse bronchiectasis as well as diffuse opacity consistent with COVID-19 PNA with ARDS. - 2/3: Trach exchange - 2/5: Decannulated to HFNC - 2/6: Enterococcal PNA/bacteremia.  Echo showed EF 65-70%, normal-appearing RV, no vegetation noted.  - 2/7: TEE with no vegetation, EF 55-60%, RV low normal function with normal size.  - 2/16: Right thoracentesis 500 cc - 2/22: ECMO circuit changed.  ECMO cannula repositioned under TEE guidance. TEE with EF 60-65%, normal RV.  - 2/27: Massive air entrainment due to leakage at cannula insertion site -> cannula repositioning & resuturing. Arrest with 20 minute downtime. Re-intubated.  - 2/28: ECMO decannulated due to recurrent air entrainment.   CT head w/o acute changes.  - 3/1: MRI head with evidence for hypoxic ischemic injury.   Patient is now DNR with plan for comfort measures/extubation after family has visited.  He is currently intubated and sedated, no labs.   Objective:   Weight Range: 77.2 kg Body mass index is 29.21 kg/m.   Vital Signs:   Temp:  [98.4 F (36.9 C)-99 F (37.2 C)] 99 F (37.2 C) (03/02 1148) Pulse Rate:  [104-121] 109 (03/03 0720) Resp:  [12-38] 38 (03/03 0720) BP: (109-162)/(56-84) 110/67 (03/02 2349) SpO2:  [91 %-100 %] 93 % (03/03 0500) Arterial Line BP: (99-152)/(53-72) 99/56 (03/03 0500) FiO2 (%):  [100 %] 100 % (03/03 0720) Last BM Date: 05/11/20  Weight change: Filed Weights   05/08/20 0700 05/11/20 0600 05/12/20 0600  Weight: 77 kg 80.6 kg 77.2 kg    Intake/Output:   Intake/Output Summary (Last 24 hours) at 05/13/2020 0752 Last data filed at 05/13/2020 0500 Gross per 24 hour  Intake 1710.15 ml  Output 1625 ml  Net 85.15 ml      Physical Exam    General: Intubated/sedated.  Neck: JVP 14+, no thyromegaly or thyroid nodule.  Lungs: Decreased bilaterally.  CV: Nondisplaced PMI.  Heart regular S1/S2, no S3/S4, no murmur.  1+ ankle edema.  Abdomen: Soft, nontender, no hepatosplenomegaly, no distention.  Skin: Intact without lesions or rashes.  Neurologic:Sedated.  Extremities: No clubbing or cyanosis.  HEENT: Normal.    Telemetry   Not on telemetry.   Labs    CBC Recent Labs    05/11/20 1749 05/11/20 2114 05/12/20 0346 05/12/20 0501 05/12/20 5465  WBC 10.6*  --  11.2*  --   --   HGB 9.2*   < > 8.9* 9.2* 9.2*  HCT 29.1*   < > 27.7* 27.0* 27.0*  MCV 90.4  --  91.7  --   --   PLT 228  --  244  --   --    < > = values in this interval not displayed.   Basic Metabolic Panel Recent Labs    05/11/20 1749 05/11/20 2114 05/12/20 0346 05/12/20 0501 05/12/20 0753  NA 134*   < > 135 135 135  K 4.0   < > 4.2 4.3 4.2  CL 94*  --  95*  --   --   CO2 28  --  30  --    --   GLUCOSE 238*  --  244*  --   --   BUN 44*  --  49*  --   --   CREATININE 1.34*  --  1.51*  --   --   CALCIUM 8.3*  --  8.4*  --   --    < > = values in this interval not displayed.   Liver Function Tests No results for input(s): AST, ALT, ALKPHOS, BILITOT, PROT, ALBUMIN in the last 72 hours. No results for input(s): LIPASE, AMYLASE in the last 72 hours. Cardiac Enzymes No results for input(s): CKTOTAL, CKMB, CKMBINDEX, TROPONINI in the last 72 hours.  BNP: BNP (last 3 results) No results for input(s): BNP in the last 8760 hours.  ProBNP (last 3 results) No results for input(s): PROBNP in the last 8760 hours.   D-Dimer No results for input(s): DDIMER in the last 72 hours. Hemoglobin A1C No results for input(s): HGBA1C in the last 72 hours. Fasting Lipid Panel No results for input(s): CHOL, HDL, LDLCALC, TRIG, CHOLHDL, LDLDIRECT in the last 72 hours. Thyroid Function Tests No results for input(s): TSH, T4TOTAL, T3FREE, THYROIDAB in the last 72 hours.  Invalid input(s): FREET3  Other results:   Imaging    EEG adult  Result Date: 05/12/2020 Lora Havens, MD     05/12/2020  8:32 AM Patient Name: Alex [name] MRN: [id number] Epilepsy Attending: Lora Havens Referring Physician/Provider: Dr June Leap Date: 05/11/2020 Duration: 22.06 mins Patient history: 50 year old male with altered mental status.  EEG to evaluate for seizures. Level of alertness: comatose AEDs during EEG study: Gabapentin, Klonopin Technical aspects: This EEG study was done with scalp electrodes positioned according to the 10-20 International system of electrode placement. Electrical activity was acquired at a sampling rate of _0  and reviewed with a high frequency filter of _1  and a low frequency filter of _2 . EEG data were recorded continuously and digitally stored. Description: EEG showed continuous generalized 3 to 6 Hz theta-delta slowing. Hyperventilation and photic stimulation were not  performed.   ABNORMALITY -Continuous slow, generalized IMPRESSION: This study is suggestive of severe diffuse encephalopathy, nonspecific etiology. No seizures or epileptiform discharges were seen throughout the recording. Priyanka Barbra Sarks     Medications:     Scheduled Medications: . bethanechol  10 mg Per Tube TID  . chlorhexidine gluconate (MEDLINE KIT)  15 mL Mouth Rinse BID  . Chlorhexidine Gluconate Cloth  6 each Topical Daily  . chlorpheniramine-HYDROcodone  5 mL Per Tube Q12H  . clonazePAM  0.5 mg Per Tube Daily  . clonazePAM  1 mg Per Tube QHS  . dextromethorphan  30 mg Per Tube TID  . docusate  100 mg Per Tube BID  .  gabapentin  300 mg Per Tube Q12H  . insulin aspart  0-20 Units Subcutaneous Q4H  . ipratropium-albuterol  3 mL Nebulization BID  . mouth rinse  15 mL Mouth Rinse 10 times per day  . melatonin  5 mg Per Tube QHS  . metoCLOPramide (REGLAN) injection  10 mg Intravenous Q8H  . multivitamin  15 mL Per Tube Daily  . pantoprazole sodium  40 mg Per Tube QHS  . QUEtiapine  50 mg Per Tube QHS  . sennosides  5 mL Per Tube QHS  . sertraline  25 mg Per Tube Daily  . sodium chloride flush  10-40 mL Intracatheter Q12H    Infusions: . sodium chloride    . sodium chloride Stopped (05/12/20 2324)  . albumin human Stopped (04/23/20 0041)  . bivalirudin (ANGIOMAX) infusion 0.5 mg/mL (Non-ACS indications) 0.055 mg/kg/hr (05/13/20 0500)  . dexmedetomidine (PRECEDEX) IV infusion 1.2 mcg/kg/hr (05/13/20 0500)  . feeding supplement (PIVOT 1.5 CAL) 1,000 mL (05/12/20 0619)  . fentaNYL infusion INTRAVENOUS 400 mcg/hr (05/13/20 0500)  . furosemide (LASIX) 200 mg in dextrose 5% 100 mL (64m/mL) infusion 10 mg/hr (05/12/20 0116)  . midazolam 2 mg/hr (05/13/20 0500)  . norepinephrine (LEVOPHED) Adult infusion Stopped (05/11/20 1236)  . piperacillin-tazobactam (ZOSYN)  IV Stopped (05/12/20 1231)    PRN Medications: Place/Maintain arterial line **AND** sodium chloride, acetaminophen  (TYLENOL) oral liquid 160 mg/5 mL, albumin human, [DISCONTINUED] lidocaine **AND** albuterol, clonazePAM, guaiFENesin, hydrALAZINE, labetalol, lip balm, midazolam, ondansetron (ZOFRAN) IV, oxyCODONE, phenol, polyethylene glycol, promethazine-codeine, simethicone, sodium chloride, sodium chloride flush   Assessment/Plan   1. Acute hypoxemic respiratory failure: Due to COVID-19 PNA with bilateral infiltrates.  Refractory hypoxemia, VV-ECMO cannulation on 03/03/2020 with improvement in oxygenation.  Developed left PTX post-subclavian CVL and had left chest tube, the left lung is re-expanded and CT out.  He was extubated 1/2 but reintubated 1/4 with agitation and suspected aspiration.  Tracheostomy 1/6.  CTA chest 1/22 with suspected chronic PEs and ARDS. ECMO cannula repositioned 1/5. ECMO circuit changed 1/26. Extubated to HFNC on 2/5. ECMO circuit changed and cannula repositioned 2/22.  LDH stable. Has struggled with hypercarbia from significant dead space ventilation. Had massive air entrainment 2/27 due to leakage at ECMO cannula insertion site, had PEA arrest with 20 minute downtime approximately. Cannula repositioned and resecured but still with some air entrainment with movement. ECMO decannulated on 2/28.  He is back on full vent with worsening CXR.  - Head MRI suggestive of severe hypoxic encephalopathy.  With worsening clinical picture, Dr. IValeta Harmshas talked to family about comfort care.  - Now off Lasix gtt.  - Patient has had remdesivir, tocilizumab, steroids for COVID-19. - Due to lack of insurance, lung transplant has not been an option.  2. 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  Clot noted on TEE in RV as well.  TTE 1/2 showed normal EF 60-65%, RV improved (mildly dilated/dysfunctional). TEE on 1/5 with moderate to severe RV dysfunction but patient was hypoxemic.  Had 1/2 dose TPA on 1/5. Echo 1/20 with mildly dilated/mildly  dysfunctional RV. CTA chest 1/22 with chronic-appearing PEs in upper lobes. - on Bivalirudin. 3. Left PTX: Left chest tube, lung is re-expanded. Tube now out.  4. Shock: Suspect septic/distributive. He is off NE now.  5. Anemia: Stable.  6. AKI: Creatinine up to 1.5 with poor UOP.   7. Hyperglycemia: insulin.  8. HTN: Controlled.     9. CHB: Episode of CHB  when hypoxemic and with cough (suspect vagal).  NSR since then.   He has occasional short vagal episodes.  10. Thrombocytopenia: Resolved  11. Ischemic digits: LUE.  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  Repeat study 1/17 showed no obstruction.  - Wound Care following. 12. ID: Group F Strep and Enterococcus faecalis in sputum. Initially completed abx for these bacteria. Now with recurrent sepsis, Enterococcus faecalis in blood now. Vancomycin restarted and continued to 2/21. TEE 2/7 with no vegetation.  - With 2/27 event, vancomycin/Zosyn were restarted.  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   - resolved 14. Neuro: Head MRI and EEG suggest anoxic encephalopathy.    Dr. Valeta Harms has spoken to family, plan for comfort care at this time with anoxic encephalopathy from episode over weekend and worsening respiratory status.  He is now DNR, plan for comfort measures/extubation when all family has been in.    Length of Stay: [place], [place]  05/13/2020, 7:52 AM  Advanced Heart Failure Team Pager [phone] (M-F; 7a - 4p)  Please contact [facility] for night-coverage after hours (4p -7a ) and weekends on [email or web address]

## 2020-05-13 NOTE — Progress Notes (Signed)
Nutrition Brief Note ° °Chart reviewed. °Pt now transitioning to comfort care.  °No further nutrition interventions warranted at this time.  °Please re-consult as needed.  ° ° Emillia Weatherly MS, RDN, LDN, CNSC °Registered Dietitian III °Clinical Nutrition °RD Pager and On-Call Pager Number Located in Amion  ° ° ° ° °

## 2020-05-13 NOTE — Progress Notes (Signed)
Critical Care Progress Note  NAME:  Dayvian Blixt, MRN:  [id number], DOB:  May 12, 1970, LOS: 65 ADMISSION DATE:  03-21-20, CONSULTATION DATE: 03/02/2020 REFERRING MD: Wynona Neat -LBPCCM, CHIEF COMPLAINT: Respiratory failure requiring ECMO  HPI/course in hospital  50 year old man admitted to hospital 12/28 with 1 week history of dyspnea cough nausea and vomiting.  Initially admitted to [facility] hospital and placed on high flow nasal cannula but rapidly failed and required intubation 12/29.  Persistent hypoxic respiratory failure with PF ratio 55 in spite of 18 of PEEP FiO2 0.1 despite paralytics.  Did not improve with prone ventilation  Cannulated for VV ECMO 12/30 via right IJ crescent cannula.  ECMO circuit was changed on 04/07/2020 Iatrogenic pneumothorax from left subclavian triple-lumen placement  12/28 admitted covid, ARDS 12/30 ECMO cannulation, iatrogenic pneumothrorax, DVT, Bivalrudin started           Left subclavian hematoma, chest tube, ceftriaxone/ azithromycin started 12/31 1Unit PRBC 1/1 Palliative consult, noted HTN, fentanyl only 1/2 Extubation I/O positive, left lung re-expanded, EF 60-65%, Lasix 20mg  BID 1/3 BiPAP in place, HTN to 190s, 200s, labetolol for BP, I/O even, cefepime and vancomycin started 1/4 agitation off BiPAP, possible aspiration 1/5 agitation overnight, reintubated      ECHO with RV dilation, ECMO cannula repositioned, LUE DVT (+), 1/2 dose tPA given,      I/O up, on lasix, Epi, NE, vasopressin started, HR/ rhythm issues noted 1/6 Tracheostomy, hypoglycemic when off tube feeds       Cortrack tune issues 1/7 on lasix gtt 4mg /hr with diuresis, agitation improved 1/8 starting steroid taper 1/9 severe agitation, heavy sedation required, lasix gtt 6mg /hr, I/Os (-)       precedex and fentanyl 1/10 Vanc stopped 1/11 sweep to 2, lasix gtt at 4mg /hr, weight up, metoprolol added for HTN 1/12 fevers to 99 noted, lasix gtt and metazolone 1/13 lasix gtt  dced 1/14 intermittent HTN, possibly flash pulmonary edema tied to sedation        Ischemic left hand changes, 1 Unit PRBCs 1/15 1/16 sweep from 4 to 2.5, chest tube removed 1/17 sweep at 6, trial of nebulized morphine for cough, LUE dopplers show no obstruction 1/18 two events of 2nd degree type II HB noted with coughing         sweep at 4, IV lasix 40, acetazolamide, distal XLT trach placement         agitation and BP spikes 1/19 agitation, air hunger, seroquel, klonopin, oxy, valproate, ketamine trial 1/20 sweep at 3.5, diuresis results in chugging, albumin given         1 Unit PRBCs, lidocaine nebulizers for cough, ancef started 1/21 sweep to 8, anxiety, I/Os (+) with lasix and acetazolamide 1/22 sweep at 5, chest CT bilateral upper lobe PEs 1/23 sweep at 5, delirium, I/Os even with lasix and acetazolamide         continued coughing, 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 1/24 sweep at 7.5, 1 Unit PRBCs, repeat bronchoscopy showed resolution of 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 1/25 sweep at 7.5, on propofol, lasix gtt at 34ml/hr, I/Os (+)         nebulized morphine and lidocaine for cough 1/26 sweep at 8, propofol/ precedex, cough present when weaned, lasix gtt at 10ml/hr        ECMO circuit changes, levophed started, 1 Unit PRBCs 1/27 sweep at 4, off sedation, delirium, I/Os even        lasix gtt restated, acetazolamide overnight 1/28 sweep at 7.5,  HTN labile 1/29 sweep of 6, patient reports a tickle in throat, cetacaine 1/30 sweep down to 6, cough improved with cetacaine and gabapentin 1/31 sweep at 5, agitation, off acetazolamide, lasix, weight up 2/1 cough, of pulmicort, cetacaine for cough, continued gabapentin, dexmethorphan       CT demonstrated trach placement against back of trachea 2/2 Trach collar trial somewhat effective in managing cough       low dose diltiazem for HR,  2/3 sweep at 4,trach cannula replacement (longer, more flexible bivona); brochoscopy  shows irritated mucosa       IV lasix and acetazolamide 2/4 sweep at 5, I/Os (+), BUN elevated, lasix gtt with diamox,       trach decannulation, increased gabapentin for cough 2/5 start HCAP coverage for rising WBC/temps, check Pct/cultures, CXR worse with lack of PEEP, diuresed well but started getting very hypotensive, sepsis vs. Too dry 04/18/20 blood cx grew E faecalis 2/7 PICC change, TEE 2/7>> sweep weans 2/22 Cannula adjustment, circuit change 2/25 up walking in the hallway with physical therapy 2/27 Air entrainment, air embolism  2/28 sweep trial on 0 , planning for decannulation vvECMO 3/1 MRI anoxic brain injury  3/2 comfort care measures    Past Medical History  none  Interim history/subjective:   Comfort measures on vent until family are ready for liberation   Objective   Blood pressure 110/67, pulse (!) 109, temperature 99 F (37.2 C), temperature source Oral, resp. rate (!) 38, height 5\' 4"  (1.626 m), weight 77.2 kg, SpO2 93 %.    Vent Mode: PCV FiO2 (%):  [100 %] 100 % Set Rate:  [32 bmp] 32 bmp Vt Set:  [470 mL] 470 mL PEEP:  [10 cmH20] 10 cmH20 Plateau Pressure:  [29 cmH20-39 cmH20] 30 cmH20   Intake/Output Summary (Last 24 hours) at 05/13/2020 07/13/2020 Last data filed at 05/13/2020 0500 Gross per 24 hour  Intake 1558.71 ml  Output 1450 ml  Net 108.71 ml   Filed Weights   05/08/20 0700 05/11/20 0600 05/12/20 0600  Weight: 77 kg 80.6 kg 77.2 kg    Examination: Constitutional: deferred on comfort measures   Labs reviewed   Assessment & Plan:   Anoxic brain injury Acute encephalopathy,  Acute delirium, with agitation. Plan: DNR  Comfort care measures   Acute hypoxemic/hypercapnic respiratory failure due to severe ARDS from COVID-19 pneumonia Probable acute PE, and RV dysfunction s/p TPA Refractory coughing- improved after trach removal but still an issue intermittently Strep group F/ enterococcal pneumonia- s/p 10 days vanc 1/29; recurrent with  bacteremia on 2/5 now s/p vanc x 14 days R effusion- lymphocyte predominant transudate drained 2/16 05/09/2020: Acute air pulmonary embolism, air entrainment from around catheter insertion site 05/10/2020: De-cannulated from [facility]   HTN Hyponatremia Sepsis secondary to E faecalis bacteremia- secondary to PNA, has grown enterococcus in sputum prior, previous vanc course completed 1/29. TEE on 2/7 without vegetations. PICC changed on 2/7.  14 day vanc therapy from PICC change completed 2/21. 05/09/2020 During periarrest event likely significant aspiration. 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

## 2020-05-13 NOTE — Progress Notes (Signed)
   05/13/20 1904  Clinical Encounter Type  Visited With Patient and family together  Visit Type Spiritual support  Referral From Nurse  Consult/Referral To Chaplain  Spiritual Encounters  Spiritual Needs Emotional;Other (Comment) (Support for Family and Staff)  Stress Factors  Family Stress Factors Major life changes   Chaplain received a page from [facility] asking for support of the chaplain. Mr. Alex [name]'s [name] Alex [name] and Alex [name] and Alex [name] were in the waiting area. At this time many were coming to visit Alex [name]. I had the wonderful opportunity of meeting the family, offering support and comfort. All was very emotional, including the staff. The staff wanted to make sure the family was okay, and no pressure was placed on making any decisions until they were ready. The love given to the family by the staff and for each other was amazing.   Chaplain Kenderick Kobler Morgan-Simpson [phone]

## 2020-05-13 NOTE — Progress Notes (Signed)
ANTICOAGULATION CONSULT NOTE  Pharmacy Consult for bivalirudin Indication: ECMO and VTE  Labs: Recent Labs    05/11/20 0410 05/11/20 1749 05/11/20 2114 05/12/20 0346 05/12/20 0501 05/12/20 0753  HGB 9.3* 9.2*   < > 8.9* 9.2* 9.2*  HCT 28.8* 29.1*   < > 27.7* 27.0* 27.0*  PLT 182 228  --  244  --   --   APTT 82* 83*  --  74*  --   --   LABPROT 21.5*  --   --  19.7*  --   --   INR 2.0*  --   --  1.7*  --   --   CREATININE 1.41* 1.34*  --  1.51*  --   --    < > = values in this interval not displayed.    Assessment: 20 yoM admitted with COVID-19 PNA with worsening hypoxia, s/p cannulation for ECMO. Pt was started on IV heparin prior to cannulation due to acute DVTs and possible PE, transitioned to bivalirudin with ECMO. S/p tPA on 1/5 and tracheostomy on 1/6. ECMO circuit changed on 05/03/2020. ECMO decanulated 2/28, continuing bivalirudin with DVTs and heparin shortage.  Pt transitioning to comfort care. No further lab draws.  Goal of Therapy:  aPTT 60-80 seconds   Plan:  -Continue bivalirudin 0.055 mg/kg/h for now   Fredonia Highland, PharmD, BCPS, [facility] Clinical Pharmacist [phone] Please check AMION for all [facility] numbers 05/13/2020

## 2020-05-13 NOTE — Procedures (Signed)
PCCM Video Bronchoscopy Procedure Note  Verbal consent from the family  Indication: Organ donation purposes  Post Procedure Diagnosis: Post Covid COVID-19 positive test (U07.1, COVID-19) with Acute Respiratory Distress Syndrome (ARDS) (J80, ARDS) (If respiratory failure or sepsis present, add as separate assessment)    Location: ICU  Condition pre procedure: Stable  Medications for procedure: He is on fentanyl and Versed  Procedure description: Bronchoscope was introduced through the ET tube which terminates above the carina all airways are very inflamed friable erythematous and pus everywhere in the airways  Airway exam revealed pus in the airways inflamed erythematous  Procedures performed: BAL on the lingula with 100 cc and collection of around 50 cc  Specimens sent: Yes for COVID  Condition post procedure: Stable  EBL: None  Complications: None

## 2020-05-13 NOTE — Progress Notes (Signed)
RT NOTE:  Settings adjusted @ [employer] request. PC 24, FiO2 80%

## 2020-05-13 NOTE — Progress Notes (Signed)
OT Cancellation Note  Patient Details Name: Alex [name] MRN: [id number] DOB: 10/04/1970   Cancelled Treatment:    Reason Eval/Treat Not Completed: Patient is now DNR with plan for comfort measures/extubation after family has visited.   Discontinue OT at this time.    Ellese Julius D Treanna Dumler 05/13/2020, 8:47 AM

## 2020-05-13 NOTE — Progress Notes (Signed)
Assisted RN transport to CT with patient.  Pt tolerated well.

## 2020-05-13 NOTE — Plan of Care (Signed)
?  Problem: Clinical Measurements: ?Goal: Will remain free from infection ?Outcome: Progressing ?  ?

## 2020-05-14 LAB — GLUCOSE, CAPILLARY: Glucose-Capillary: 198 mg/dL — ABNORMAL HIGH (ref 70–99)

## 2020-05-14 MED ORDER — DIPHENHYDRAMINE HCL 50 MG/ML IJ SOLN: 25.0000 mg | INTRAMUSCULAR | Status: DC | PRN

## 2020-05-14 MED ORDER — GLYCOPYRROLATE 0.2 MG/ML IJ SOLN: 0.2000 mg | INTRAMUSCULAR | Status: DC | PRN

## 2020-05-14 MED ORDER — ACETAMINOPHEN 650 MG RE SUPP: 650.0000 mg | Freq: Four times a day (QID) | RECTAL | Status: DC | PRN

## 2020-05-14 MED ORDER — GLYCOPYRROLATE 1 MG PO TABS
1.0000 mg | ORAL_TABLET | ORAL | Status: DC | PRN
  Filled 2020-05-14: qty 1

## 2020-05-14 MED ORDER — ACETAMINOPHEN 325 MG PO TABS: 650.0000 mg | ORAL_TABLET | Freq: Four times a day (QID) | ORAL | Status: DC | PRN

## 2020-05-14 MED ORDER — POLYVINYL ALCOHOL 1.4 % OP SOLN
1.0000 [drp] | Freq: Four times a day (QID) | OPHTHALMIC | Status: DC | PRN
  Filled 2020-05-14: qty 15

## 2020-05-14 MED ORDER — DEXTROSE 5 % IV SOLN: INTRAVENOUS | Status: DC

## 2020-05-15 LAB — URINE CULTURE
Culture: NO GROWTH
Special Requests: NORMAL

## 2020-05-16 LAB — CULTURE, BLOOD (ROUTINE X 2)
Culture: NO GROWTH
Culture: NO GROWTH
Special Requests: ADEQUATE
Special Requests: ADEQUATE

## 2020-05-17 LAB — CULTURE, BAL-QUANTITATIVE W GRAM STAIN: Culture: 20000 — AB

## 2020-06-11 NOTE — Progress Notes (Signed)
   05/29/20 0755  Airway 7.5 mm  Placement Date/Time: 05/09/20 0345   Placed By: ICU physician  Airway Device: Endotracheal Tube  Laryngoscope Blade: MAC;4  ETT Types: Endobronchial;Oral  Size (mm): 7.5 mm  Cuffed: Cuffed  Insertion attempts: 1  Airway Equipment: Video Laryngoscope  ...  Secured at (cm) 25 cm  Measured From [place] By [employer] Tube Holder  Tube Holder Repositioned Yes  Prone position No  Cuff Pressure (cm H2O) 30 cm H2O  Site Condition Dry  Adult Ventilator Settings  Vent Type Servo i  Humidity HME  Vent Mode PCV  Set Rate 32 bmp  FiO2 (%) 70 %  I Time 0.7 Sec(s)  Pressure Control 24 cmH20  PEEP 10 cmH20  Adult Ventilator Measurements  Peak Airway Pressure 35 L/min  Mean Airway Pressure 18 cmH20  Plateau Pressure 27 cmH20  Resp Rate Spontaneous 0 br/min  Resp Rate Total 32 br/min  Exhaled Vt 308 mL  Measured Ve 11.1 mL  I:E Ratio Measured 1:1.7  Auto PEEP 0 cmH20  Total PEEP 10 cmH20  SpO2 98 %  Adult Ventilator Alarms  Alarms On Y  Ve High Alarm 22 L/min  Ve Low Alarm 5 L/min  Resp Rate High Alarm 48 br/min  Resp Rate Low Alarm 20  PEEP Low Alarm 8 cmH2O  Press High Alarm 50 cmH2O  VAP Prevention  Cuff pressure (initial) 30 cm H2O  HME changed No  Ventilator changed No  Transported while on vent No  HOB> 30 Degrees Y  Equipment wiped down Yes  Daily Weaning Assessment  Daily Assessment of Readiness to Wean Wean protocol criteria not met  Reason not met Fi02 > 40%;PEEP > 8  Breath Sounds  Bilateral Breath Sounds Diminished  Airway Suctioning/Secretions  Suction Type ETT  Suction Device  Catheter  Secretion Amount Small  Secretion Color Tan  Secretion Consistency Thick  Suction Tolerance Tolerated well  Suctioning Adverse Effects None

## 2020-06-11 NOTE — Procedures (Signed)
Extubation Procedure Note  Patient Details:   Name: Alex [name] DOB: 1971/01/03 MRN: [id number]   Airway Documentation:  Airway 7.5 mm (Active)  Secured at (cm) 25 cm 05/13/2020 1532  Measured From Lips 05/28/2020 1150  Secured Location Left 06/06/2020 1150  Secured By [employer] 06/01/2020 1150  Tube Holder Repositioned Yes 05/13/2020 1150  Prone position No 05/28/2020 1150  Cuff Pressure (cm H2O) 30 cm H2O 05/13/2020 0755  Site Condition Dry 05/28/2020 1150   Vent end date: 05/18/2020 Vent end time: 1932   Evaluation  O2 sats: currently acceptable Complications: No apparent complications Patient did tolerate procedure well. Bilateral Breath Sounds: Diminished   No  Rushie Chestnut [facility] 05/25/2020, 7:33 PM

## 2020-06-11 NOTE — Progress Notes (Signed)
Pt term extabated.

## 2020-06-11 NOTE — Progress Notes (Signed)
Time of death : 77. Verified with this RN and Aline August RN. MD aware. Family at bedside.

## 2020-06-11 NOTE — Death Summary Note (Signed)
DEATH SUMMARY   Patient Details  Name: Alex [name] MRN: [id number] DOB: 1970/08/14  Admission/Discharge Information   Admit Date:  2020-03-29  Date of Death: Date of Death: 06-03-20  Time of Death: Time of Death: 1949  Length of Stay: 08/01/2064  Referring Physician: Patient, No Pcp Per   Reason(s) for Hospitalization  Covid-19 pneumonia with respiratory failure  Diagnoses  Preliminary cause of death:  Secondary Diagnoses (including complications and co-morbidities):  Active Problems:   COVID-19   Pneumonia due to COVID-19 virus   Acute respiratory failure with hypoxia (HCC)   Respiratory failure (HCC)   Pressure injury of skin   History of extracorporeal membrane oxygenation   Bacteremia due to Enterococcus   ARDS (adult respiratory distress syndrome) [facility])   Brief Hospital Course (including significant findings, care, treatment, and services provided and events leading to death)  Kyrel Leighton is a 50 y.o. year old male who was initially admitted to [facility] long hospital with a 1 week history of dyspnea cough nausea and vomiting.  He has severe hypoxia which rapidly progressed from requiring heated high flow nasal cannula to intubation mechanical ventilation with high ventilator settings early low PF ratio despite 18 of PEEP and FiO2 of 1.0 with paralytics.  He was transferred to [facility] and was cannulated for VV ECMO on 03-30-2023.  A chest tube was also placed for an iatrogenic pneumothorax on the left side and left chest hematoma.  His ECMO run was characterized by difficulty controlling agitation.  He did not tolerate extubation and required reintubation following which he underwent tracheostomy.  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  He was eventually weaned off sedation and his ECMO requirements decreased following successful diuresis.  He was eventually up and ambulatory.  Chest x-ray did not show any improvement and  he continued to require significant sweep gas rate.  Given his lack of improvement after a month on ECMO, his case was discussed with the lung transplant program at [facility].  He was declined due to insufficient insurance and social supports.  Nevertheless he was making progress and was up walking in the hallway with physical therapy  On 2/27 he suffered a sudden episode of narrow entrainment with air embolization from which he was initially stabilized.  At that point he tolerated a sweep trial and was decannulated from VV ECMO.  An MRI performed on 3/1 showed significant anoxic brain injury.  Patient did not return to his previous baseline mental status.  It was at this point to the family decided to transition to comfort care and he passed away 3/4.    Pertinent Labs and Studies  Significant Diagnostic Studies CT ABDOMEN PELVIS WO CONTRAST  Result Date: 05/13/2020 CLINICAL DATA:  COVID-19, respiratory failure, organ donation EXAM: CT CHEST, ABDOMEN AND PELVIS WITHOUT CONTRAST TECHNIQUE: Multidetector CT imaging of the chest, abdomen and pelvis was performed following the standard protocol without IV contrast. COMPARISON:  04/13/2020, 05/12/2020 FINDINGS: CT CHEST FINDINGS Cardiovascular: Unenhanced imaging of the heart and great vessels demonstrates no pericardial effusion. Normal caliber of the thoracic aorta. Right-sided central catheter tip at the atriocaval junction. Mediastinum/Nodes: No enlarged mediastinal, hilar, or axillary lymph nodes. Thyroid gland, trachea, and esophagus demonstrate no significant findings. Endotracheal tube terminates well above carina. Lungs/Pleura: Widespread bilateral airspace disease consistent with history of ARDS. Small right pleural effusion. No pneumothorax. Musculoskeletal: No acute or destructive bony lesions. Reconstructed images demonstrate no additional findings. CT ABDOMEN PELVIS FINDINGS Hepatobiliary:  High attenuation material within the gallbladder  consistent with sludge. No evidence of cholecystitis. Unenhanced imaging of the liver is unremarkable. Pancreas: Unremarkable. No pancreatic ductal dilatation or surrounding inflammatory changes. Spleen: Normal in size without focal abnormality. Adrenals/Urinary Tract: No urinary tract calculi or obstructive uropathy. The bladder is decompressed with a Foley catheter. The adrenals are normal. Stomach/Bowel: No bowel obstruction or ileus. Normal appendix right lower quadrant. No bowel wall thickening or inflammatory change. Vascular/Lymphatic: No significant vascular findings are present. No enlarged abdominal or pelvic lymph nodes. Reproductive: Prostate is unremarkable. Other: Small volume ascites, greatest in the right flank. No free intraperitoneal gas. There is diffuse body wall edema consistent with anasarca. Musculoskeletal: No acute or destructive bony lesions. Reconstructed images demonstrate no additional findings. IMPRESSION: 1. Widespread bilateral ground-glass airspace disease consistent with ARDS. 2. Small right pleural effusion. 3. Small volume ascites. 4. Diffuse anasarca. 5. High attenuation material within the gallbladder, likely sludge. No evidence of cholecystitis. 6. Support devices as above. Electronically Signed   By: Sharlet SalinaMichael  Brown M.D.   On: 05/13/2020 18:18   DG Chest 1 View  Result Date: 04/28/2020 CLINICAL DATA:  Post thoracentesis.  ECMO. EXAM: CHEST  1 VIEW COMPARISON:  04/28/2020 FINDINGS: Severe diffuse bilateral airspace disease, slightly worsened since prior study. No visible effusions. No pneumothorax. Support devices are unchanged. IMPRESSION: No pneumothorax following thoracentesis. Severe diffuse bilateral airspace disease, worsening since prior study. Electronically Signed   By: Charlett NoseKevin  Dover M.D.   On: 04/28/2020 10:04   DG Abd 1 View  Result Date: 05/09/2020 CLINICAL DATA:  Orogastric tube placement EXAM: ABDOMEN - 1 VIEW COMPARISON:  04/26/2020 FINDINGS: Orogastric  tube is in place with its tip overlying the expected mid body of the stomach. Previously noted nasoenteric feeding tube has been removed. The abdominal gas pattern is nonspecific due to a paucity of intra-abdominal gas. Pelvis excluded from view. ECMO cannula is in place with its inferior marker within the intrahepatic IVC. IMPRESSION: Orogastric tube tip within the expected mid body of the stomach. Electronically Signed   By: Helyn NumbersAshesh  Parikh MD   On: 05/09/2020 06:51   CT HEAD WO CONTRAST  Result Date: 05/13/2020 CLINICAL DATA:  Organ donation EXAM: CT HEAD WITHOUT CONTRAST TECHNIQUE: Contiguous axial images were obtained from the base of the skull through the vertex without intravenous contrast. COMPARISON:  May 10, 2020 FINDINGS: Brain: No evidence of acute territorial infarction, hemorrhage, hydrocephalus,extra-axial collection or mass lesion/mass effect. Normal gray-white differentiation. Ventricles are normal in size and contour. Vascular: No hyperdense vessel or unexpected calcification. Skull: The skull is intact. No fracture or focal lesion identified. Sinuses/Orbits: Mucosal thickening seen within the bilateral maxillary sinuses. The orbits and globes intact. Other: None IMPRESSION: No acute intracranial abnormality. Electronically Signed   By: Jonna ClarkBindu  Avutu M.D.   On: 05/13/2020 18:10   CT HEAD WO CONTRAST  Result Date: 05/10/2020 CLINICAL DATA:  Neuro deficit.  Stroke suspected. EXAM: CT HEAD WITHOUT CONTRAST TECHNIQUE: Contiguous axial images were obtained from the base of the skull through the vertex without intravenous contrast. COMPARISON:  None. FINDINGS: Brain: No evidence of large-territorial acute infarction. No parenchymal hemorrhage. No mass lesion. No extra-axial collection. No mass effect or midline shift. No hydrocephalus. Basilar cisterns are patent. Vascular: No hyperdense vessel. Skull: No acute fracture or focal lesion. Sinuses/Orbits: High mucosal thickening within the  bilateral maxillary sinuses. Almost complete opacification of bilateral ethmoid and sphenoid sinuses. Partial opacification of bilateral ethmoid sinuses. Fluid noted within bilateral mastoid air cells  and middle ears. The orbits are unremarkable. Other: Partially visualized nasogastric tube noted. Endotracheal tube partially visualized. IMPRESSION: 1. No acute intracranial abnormality. 2. Mucosal thickening of all paranasal sinuses, bilateral mastoid air cells, and bilateral middle ears. Electronically Signed   By: Tish Frederickson M.D.   On: 05/10/2020 15:13   CT CHEST WO CONTRAST  Result Date: 05/13/2020 CLINICAL DATA:  COVID-19, respiratory failure, organ donation EXAM: CT CHEST, ABDOMEN AND PELVIS WITHOUT CONTRAST TECHNIQUE: Multidetector CT imaging of the chest, abdomen and pelvis was performed following the standard protocol without IV contrast. COMPARISON:  04/13/2020, 05/12/2020 FINDINGS: CT CHEST FINDINGS Cardiovascular: Unenhanced imaging of the heart and great vessels demonstrates no pericardial effusion. Normal caliber of the thoracic aorta. Right-sided central catheter tip at the atriocaval junction. Mediastinum/Nodes: No enlarged mediastinal, hilar, or axillary lymph nodes. Thyroid gland, trachea, and esophagus demonstrate no significant findings. Endotracheal tube terminates well above carina. Lungs/Pleura: Widespread bilateral airspace disease consistent with history of ARDS. Small right pleural effusion. No pneumothorax. Musculoskeletal: No acute or destructive bony lesions. Reconstructed images demonstrate no additional findings. CT ABDOMEN PELVIS FINDINGS Hepatobiliary: High attenuation material within the gallbladder consistent with sludge. No evidence of cholecystitis. Unenhanced imaging of the liver is unremarkable. Pancreas: Unremarkable. No pancreatic ductal dilatation or surrounding inflammatory changes. Spleen: Normal in size without focal abnormality. Adrenals/Urinary Tract: No urinary  tract calculi or obstructive uropathy. The bladder is decompressed with a Foley catheter. The adrenals are normal. Stomach/Bowel: No bowel obstruction or ileus. Normal appendix right lower quadrant. No bowel wall thickening or inflammatory change. Vascular/Lymphatic: No significant vascular findings are present. No enlarged abdominal or pelvic lymph nodes. Reproductive: Prostate is unremarkable. Other: Small volume ascites, greatest in the right flank. No free intraperitoneal gas. There is diffuse body wall edema consistent with anasarca. Musculoskeletal: No acute or destructive bony lesions. Reconstructed images demonstrate no additional findings. IMPRESSION: 1. Widespread bilateral ground-glass airspace disease consistent with ARDS. 2. Small right pleural effusion. 3. Small volume ascites. 4. Diffuse anasarca. 5. High attenuation material within the gallbladder, likely sludge. No evidence of cholecystitis. 6. Support devices as above. Electronically Signed   By: Sharlet Salina M.D.   On: 05/13/2020 18:18   MR BRAIN W WO CONTRAST  Result Date: 05/11/2020 CLINICAL DATA:  Encephalopathy EXAM: MRI HEAD WITHOUT AND WITH CONTRAST TECHNIQUE: Multiplanar, multiecho pulse sequences of the brain and surrounding structures were obtained without and with intravenous contrast. CONTRAST:  8mL GADAVIST GADOBUTROL 1 MMOL/ML IV SOLN COMPARISON:  None. FINDINGS: Brain: Multifocal mildly reduced diffusion including involvement of the caudate nuclei bilaterally, bilateral frontoparietal and occipital cortex with some subcortical involvement, and cerebellum bilaterally. No evidence hemorrhage. Minimal corresponding enhancement in the left frontal lobe. No mass, mass effect, hydrocephalus, or extra-axial collection. Vascular: Major vessel flow voids at the skull base are preserved. Skull and upper cervical spine: Decreased T1 marrow signal probably reflects anemia. Sinuses/Orbits: Diffuse paranasal sinus mucosal thickening with  nonspecific left maxillary sinus air-fluid level. Orbits are unremarkable. Other: Sella is unremarkable.  Bilateral mastoid effusions. IMPRESSION: Multifocal abnormal signal likely reflecting hypoxic/ischemic injury. Electronically Signed   By: Guadlupe Spanish M.D.   On: 05/11/2020 13:19   DG CHEST PORT 1 VIEW  Result Date: 05/12/2020 CLINICAL DATA:  COVID-19, recent ECMO, intubation, cardia respiratory failure EXAM: PORTABLE CHEST 1 VIEW COMPARISON:  Portable exam 0546 hours compared to 05/11/2020 FINDINGS: Tip of endotracheal tube projects 2.7 cm above carina. Feeding tube extends into stomach. RIGHT arm PICC line tip projects over SVC. Borderline enlargement  of cardiac silhouette. Severe diffuse BILATERAL pulmonary infiltrates which may represent multifocal pneumonia/ COVID-19 with or without superimposed ARDS. No pleural effusion or pneumothorax. IMPRESSION: Extensive BILATERAL pulmonary infiltrates consistent with multifocal pneumonia increased since previous exam, with or without superimposed ARDS. Electronically Signed   By: Ulyses Southward M.D.   On: 05/12/2020 08:19   DG CHEST PORT 1 VIEW  Result Date: 05/11/2020 CLINICAL DATA:  Intubated EXAM: PORTABLE CHEST 1 VIEW COMPARISON:  May 10, 2020 FINDINGS: The cardiomediastinal silhouette is unchanged in contour.Removal of ECMO cannulation support apparatus. RIGHT upper extremity PICC tip terminates over the inferior SVC. ETT tip terminates 3.2 cm above the carina. The enteric tube courses through the chest to the abdomen beyond the field-of-view. Small RIGHT greater than LEFT pleural effusions. No significant pneumothorax. Diffuse hazy opacities bilaterally, improved since prior. Visualized abdomen is unremarkable. No acute osseous abnormality. IMPRESSION: 1. Improving diffuse hazy opacities bilaterally. 2.  Support apparatus as described above. Electronically Signed   By: Meda Klinefelter MD   On: 05/11/2020 07:54   DG CHEST PORT 1 VIEW  Result  Date: 05/10/2020 CLINICAL DATA:  Recent COVID, ECMO EXAM: PORTABLE CHEST 1 VIEW COMPARISON:  05/09/2020 FINDINGS: Interval removal of left internal jugular central line. Left PICC line coils on itself in the left innominate vein. Endotracheal tube, ECMO, NG tube are unchanged. Cardiomegaly. Diffuse bilateral airspace disease. Right pleural effusion. Findings stable since prior study. IMPRESSION: Interval removal of left central line. Left PICC line coils on itself in the left innominate vein. Diffuse airspace disease.  Right pleural effusion.  Findings stable. Electronically Signed   By: Charlett Nose M.D.   On: 05/10/2020 08:10   DG CHEST PORT 1 VIEW  Result Date: 05/09/2020 CLINICAL DATA:  Central venous catheter placement EXAM: PORTABLE CHEST 1 VIEW COMPARISON:  05/09/2020 at 3:43 a.m. FINDINGS: Left internal jugular central venous catheter has been placed with its tip likely within the a right subclavian or right external jugular vein. Endotracheal tube, and left upper extremity PICC line are unchanged. ECMO catheter has been slightly advanced with its middle marker now within the superior right atrium. Nasogastric tube has been placed, extending into the upper abdomen beyond the margin of the examination. Diffuse airspace infiltrate is again seen and is unchanged. No pneumothorax or pleural effusion. Cardiac silhouette is largely obscured. IMPRESSION: Left internal jugular central venous catheter tip within the right subclavian or right external jugular vein. Nasogastric tube extends into the upper abdomen beyond the margin of the examination. ECMO cannula slightly advanced, middle marker now in right atrium. Stable diffuse pulmonary infiltrates. These results will be called to the ordering clinician or representative by the Radiologist Assistant, and communication documented in the PACS or [employer]. Electronically Signed   By: Helyn Numbers MD   On: 05/09/2020 06:49   DG CHEST PORT 1  VIEW  Result Date: 05/09/2020 CLINICAL DATA:  Respiratory failure EXAM: PORTABLE CHEST 1 VIEW COMPARISON:  05/08/2020 FINDINGS: Endotracheal tube is seen 3.5 cm above the carina. Left upper extremity PICC line tip noted at the superior vena cava. ECMO catheter is unchanged with its middle marker at the superior cavoatrial junction, inferior marker at the inferior cavoatrial junction, and the distal tip of the catheter within the expected intrahepatic inferior vena cava. Pulmonary insufflation is stable. Extensive, diffuse airspace infiltrate is unchanged. No pneumothorax or pleural effusion. Cardiac silhouette is largely obscured, but grossly unremarkable. IMPRESSION: Stable examination. Unchanged diffuse, extensive airspace infiltrate. Electronically Signed   By: Gloris Ham  Ramiro Harvest MD   On: 05/09/2020 04:25   DG CHEST PORT 1 VIEW  Result Date: 05/08/2020 CLINICAL DATA:  50 year old male on ECMO. EXAM: PORTABLE CHEST 1 VIEW COMPARISON:  Chest x-ray 05/07/2020. FINDINGS: ECMO catheter appears similarly positioned to prior examination. There is a left upper extremity PICC with tip terminating in the mid superior vena cava. Near complete opacification of the lungs bilaterally with widespread air bronchograms, similar to recent prior examinations. Pulmonary vasculature and cardiomediastinal silhouette are completely obscured. IMPRESSION: 1. Support apparatus, as above. 2. Stable appearance of the lungs, without significant change in aeration compared to prior examinations, as above. Electronically Signed   By: Trudie Reed M.D.   On: 05/08/2020 08:12   DG CHEST PORT 1 VIEW  Result Date: 05/07/2020 CLINICAL DATA:  Shortness of breath, on ECMO EXAM: PORTABLE CHEST 1 VIEW COMPARISON:  05/06/2020 FINDINGS: ECMO cannula terminates in the IVC. Left upper extremity PICC tip terminates at the mid SVC. Telemetry leads overlie the chest. Removal of the transesophageal tube seen previously. Persistent heterogeneous  opacities throughout both lungs likely with layering effusions. Cardiomediastinal silhouette is largely obscured by overlying opacity. No acute osseous or soft tissue abnormality. IMPRESSION: Persistent heterogeneous opacities throughout both lungs with layering effusions. Appearance not significantly changed from prior. Removal of the transesophageal tube with otherwise stable positioning of support devices. Electronically Signed   By: Kreg Shropshire M.D.   On: 05/07/2020 06:17   DG CHEST PORT 1 VIEW  Result Date: 05/06/2020 CLINICAL DATA:  ECMO, COVID EXAM: PORTABLE CHEST 1 VIEW COMPARISON:  Radiograph 05/05/2020 FINDINGS: ECMO cannula the again terminates in the inferior vena cava. Transesophageal tube in stable satisfactory positioning. Left upper extremity PICC terminates in the mid to lower SVC. Telemetry leads and support devices overlie the chest. Redemonstration of the dense bilateral perihilar and basilar predominant airspace opacities with layering effusions. Some minimal mid lung clearing on the right. No discernible pneumothorax. Cardiomediastinal silhouette remains largely obscured. No acute osseous or soft tissue abnormality. IMPRESSION: 1. Lines and tubes are stable. 2. Redemonstration of dense bilateral perihilar and basilar predominant airspace opacities with layering effusions. Some minimal clearing on the right. Electronically Signed   By: Kreg Shropshire M.D.   On: 05/06/2020 06:54   DG CHEST PORT 1 VIEW  Result Date: 05/05/2020 CLINICAL DATA:  Shortness of breath, ECMO, recent COVID EXAM: PORTABLE CHEST 1 VIEW COMPARISON:  Radiograph 05/04/2020 FINDINGS: ECMO cannula terminates in the IVC. Transesophageal tube tip terminates below the margins of imaging. Telemetry leads overlie the chest. Left upper extremity PICC tip terminates at the superior cavoatrial junction. Dense heterogeneous bilateral airspace opacities are present. Suspect some layering pleural effusion as well. No pneumothorax.  No acute osseous or soft tissue abnormality. IMPRESSION: 1. Support devices as above. 2. Grossly stable heterogeneous bilateral airspace opacities and probable layering effusions. 3. Left upper extremity PICC tip terminates at the superior cavoatrial junction. Electronically Signed   By: Kreg Shropshire M.D.   On: 05/05/2020 06:46   DG CHEST PORT 1 VIEW  Result Date: 05/04/2020 CLINICAL DATA:  Patient on ECHO, assess PICC repositioning EXAM: PORTABLE CHEST 1 VIEW COMPARISON:  Chest radiograph May 04, 2020 at 0548 hours FINDINGS: ECMO cannula tip terminates in the inferior vena cava. Repositioning of the left upper extremity PICC which now terminates in the superior vena cava with tip oriented caudally. A enteric tube coursing below the diaphragm with tip obscured by collimation. The heart size and mediastinal contours are largely obscured. Similar diffuse bilateral  airspace opacities. The visualized skeletal structures are unchanged. IMPRESSION: 1. Repositioning of the left upper extremity PICC which now terminates in the superior vena cava with tip oriented caudally. 2. Unchanged positioning of the ECMO cannulae with similar diffuse bilateral airspace opacities. Electronically Signed   By: Maudry Mayhew MD   On: 05/04/2020 12:08   DG CHEST PORT 1 VIEW  Result Date: 05/04/2020 CLINICAL DATA:  ECMO therapy, respiratory failure EXAM: PORTABLE CHEST 1 VIEW COMPARISON:  Radiograph 05/03/2020 FINDINGS: Redemonstration of the left upper extremity PICC which crosses midline and redirected superiorly likely in the SVC directing towards the right brachiocephalic vein. ECMO cannula tip terminates in the inferior vena cava. Transesophageal tip terminates in the left upper quadrant. Telemetry leads overlie the chest. Diffuse airspace opacity throughout both lungs is unchanged from prior. Cardiac silhouette largely obscured by this density. No visible pneumothorax. Layering effusion difficult to exclude. No acute  osseous or soft tissue abnormality. IMPRESSION: 1. Stable appearance of the left upper extremity PICC which crosses midline and redirected superiorly likely in the SVC directing towards the right brachiocephalic vein. Consider repositioning 2. Additional lines and tubes as above. 3. Stable diffuse airspace opacity throughout both lungs. These results will be called to the ordering clinician or representative by the Radiologist Assistant, and communication documented in the PACS or [employer]. Electronically Signed   By: Kreg Shropshire M.D.   On: 05/04/2020 06:55   DG CHEST PORT 1 VIEW  Result Date: 05/03/2020 CLINICAL DATA:  ECMO therapy.  Respiratory failure EXAM: PORTABLE CHEST 1 VIEW COMPARISON:  May 03, 2020 study obtained earlier in the day FINDINGS: The left-sided central catheter tip is again to the right of midline directed superiorly, likely in the distal most aspect of the right internal jugular vein. ECMO catheter tip is in the inferior vena cava. Enteric tube tip is below the diaphragm. No pneumothorax. Diffuse airspace opacity throughout the lungs bilaterally is stable. Cardiac silhouette is obscured by the extensive airspace opacity bilaterally. No appreciable bone lesions. IMPRESSION: Tube and catheter positions as described without pneumothorax. Note that the left-sided central catheter tip is directed superiorly in what most likely represents a distal aspect of the right internal jugular vein, unchanged from earlier in the day. Widespread airspace opacity persists. Electronically Signed   By: Bretta Bang III M.D.   On: 05/03/2020 15:17   DG CHEST PORT 1 VIEW  Result Date: 05/03/2020 CLINICAL DATA:  Patient on ECMO. Respiratory failure due to COVID 19. EXAM: PORTABLE CHEST 1 VIEW COMPARISON:  Single-view of the chest earlier today and 05/02/2020. FINDINGS: Tip of the patient's right PICC now projects over the neck of the right clavicle. The catheter tip is likely in the distal  most right internal jugular vein. Support apparatus is otherwise unchanged. Near complete whiteout of the chest persists. IMPRESSION: Right PICC remains malpositioned with its tip now projecting over the neck of the right clavicle, likely in the distal right internal jugular vein. No other change. Electronically Signed   By: Drusilla Kanner M.D.   On: 05/03/2020 12:19   DG CHEST PORT 1 VIEW  Result Date: 05/03/2020 CLINICAL DATA:  Respiratory failure and ECMO. EXAM: PORTABLE CHEST 1 VIEW COMPARISON:  05/02/2020 FINDINGS: Again noted is an ECMO cannula in the right neck that extends into the upper abdomen. Dense opacities throughout both lungs with central air bronchograms. Cardiac silhouette cannot be well identified due to the diffuse lung densities. Negative for pneumothorax. Feeding tube extends into the abdomen but  the tip is beyond the image. Left arm PICC line tip has apparently flipped up into the right innominate rain. IMPRESSION: 1. No change in the bilateral lung opacities. Findings are compatible with bilateral lung consolidation. 2. PICC line tip has flipped up into the right innominate vein. Electronically Signed   By: Richarda Overlie M.D.   On: 05/03/2020 07:59   DG CHEST PORT 1 VIEW  Result Date: 05/02/2020 CLINICAL DATA:  50 year old on ECMO. EXAM: PORTABLE CHEST 1 VIEW COMPARISON:  05/01/2020 FINDINGS: There is a left arm PICC line tip is in the SVC. There is a feeding tube with tip below the GE junction. The right IJ ECMO cannula is again noted terminating below the diaphragm. Interstitial and airspace opacities within both lungs are not significantly changed compared with previous exam. Similar appearance of right pleural effusion IMPRESSION: 1. Stable exam. No change in aeration to the lungs. 2. Stable support apparatus. Electronically Signed   By: Signa Kell M.D.   On: 05/02/2020 08:15   DG CHEST PORT 1 VIEW  Result Date: 05/01/2020 CLINICAL DATA:  50 year old male with ECMO EXAM:  PORTABLE CHEST 1 VIEW COMPARISON:  04/30/2020 FINDINGS: Cardiomediastinal silhouette likely unchanged with the borders obscured by overlying lung/pleural disease. Diffuse mixed interstitial and airspace opacities of the lungs, obscuring the heart borders and the hemidiaphragms. No significant change compared to the prior. Right IJ ECMO cannula, again terminates below the diaphragm. Enteric feeding tube projects over the mediastinum terminating out of the field of view. Left upper extremity PICC, unchanged. Overlying EKG leads. No evidence of pneumothorax. IMPRESSION: Unchanged position of the right IJ ECMO cannula. Unchanged appearance of diffuse mixed interstitial and airspace disease of the lungs. Unchanged enteric feeding tube and left upper extremity PICC Electronically Signed   By: Gilmer Mor D.O.   On: 05/01/2020 08:18   DG CHEST PORT 1 VIEW  Result Date: 04/30/2020 CLINICAL DATA:  COVID, ECMO, shortness of breath EXAM: PORTABLE CHEST 1 VIEW COMPARISON:  04/29/2020 FINDINGS: Support devices remain in place, unchanged. Severe diffuse bilateral airspace disease, unchanged heart is borderline in size. No visible effusions or pneumothorax. IMPRESSION: Severe diffuse bilateral airspace disease, unchanged. Right pleural effusion Electronically Signed   By: Charlett Nose M.D.   On: 04/30/2020 07:46   DG CHEST PORT 1 VIEW  Result Date: 04/29/2020 CLINICAL DATA:  ECMO EXAM: PORTABLE CHEST 1 VIEW COMPARISON:  04/26/2020 FINDINGS: Enteric tube passes into the stomach with tip out of field of view. Left PICC line tip unchanged. Right ECMO cannula is unchanged. Similar appearance diffuse bilateral pulmonary opacities. No definite pleural effusion. No pneumothorax. Cardiomediastinal contours are poorly delineated but unchanged. IMPRESSION: 1. Lines and tubes unchanged. 2. Similar appearance diffuse bilateral pulmonary opacities. Electronically Signed   By: Guadlupe Spanish M.D.   On: 04/29/2020 09:17   DG CHEST  PORT 1 VIEW  Result Date: 04/28/2020 CLINICAL DATA:  Recent COVID, ECMO. EXAM: PORTABLE CHEST 1 VIEW COMPARISON:  04/27/2020 FINDINGS: Support devices are stable. Extensive bilateral airspace disease. No significant change since prior study. IMPRESSION: Severe diffuse bilateral airspace disease, unchanged. Electronically Signed   By: Charlett Nose M.D.   On: 04/28/2020 06:56   DG CHEST PORT 1 VIEW  Result Date: 04/27/2020 CLINICAL DATA:  COVID.  ECMO. EXAM: PORTABLE CHEST 1 VIEW COMPARISON:  04/26/2020. FINDINGS: Left subclavian line, feeding tube, ECMO device in stable position. Heart size stable. Diffuse severe bilateral pulmonary infiltrates again noted without interim change. Right pleural effusion cannot be excluded. No  pneumothorax. IMPRESSION: 1. Lines and tubes in stable position. 2. Diffuse severe bilateral pulmonary infiltrates again noted without interim change. Right pleural effusion cannot be excluded. Electronically Signed   By: Maisie Fus  Register   On: 04/27/2020 07:40   DG CHEST PORT 1 VIEW  Result Date: 04/26/2020 CLINICAL DATA:  History of COVID-19 positivity and ECMO, follow-up EXAM: PORTABLE CHEST 1 VIEW COMPARISON:  04/25/2020 FINDINGS: ECMO cannula, left-sided PICC line and feeding catheter are again noted and stable. Cardiac shadow is stable. Diffuse bilateral airspace opacities is noted full air bronchograms similar to that seen on the prior exam. No new focal abnormality is noted. IMPRESSION: Diffuse widespread airspace opacity similar to that seen on the prior exam. Tubes and lines as described stable from the prior study. Electronically Signed   By: Alcide Clever M.D.   On: 04/26/2020 08:02   DG CHEST PORT 1 VIEW  Result Date: 04/25/2020 CLINICAL DATA:  ECMO therapy.  Airspace consolidation EXAM: PORTABLE CHEST 1 VIEW COMPARISON:  April 24, 2020 FINDINGS: ECMO catheter tip is in the inferior vena cava. Enteric tube tip is below the diaphragm. Peripherally inserted central  catheter tip in superior vena cava. No pneumothorax. Diffuse opacification throughout the lungs bilaterally noted with air bronchograms in the lower lobe regions. Heart is upper normal in size with pulmonary vascularity grossly within normal limits. No adenopathy. No bone lesions. IMPRESSION: Tube and catheter positions as described without evident pneumothorax. Widespread airspace opacity bilaterally with consolidation in both lower lobes. Air bronchograms noted in both lower lobes. Appearance of the lungs is essentially stable compared to the prior study. Question widespread pulmonary edema versus pneumonia. A degree of ARDS is also possible. More than one of these entities may be present concurrently. Stable cardiac silhouette. Electronically Signed   By: Bretta Bang III M.D.   On: 04/25/2020 08:45   DG Abd Portable 1V  Result Date: 05/10/2020 CLINICAL DATA:  Feeding tube placement EXAM: PORTABLE ABDOMEN - 1 VIEW COMPARISON:  05/09/2020 FINDINGS: Feeding tube tip within the left mid abdomen, likely within jejunum. Nonobstructed bowel-gas pattern with moderate stool. No radiopaque calculi. IMPRESSION: Feeding tube tip overlies the left mid abdomen, likely within jejunum. Electronically Signed   By: Jasmine Pang M.D.   On: 05/10/2020 18:16   DG Abd Portable 1V  Result Date: 04/26/2020 CLINICAL DATA:  Status post feeding catheter placement EXAM: PORTABLE ABDOMEN - 1 VIEW COMPARISON:  04/16/2020 FINDINGS: Scattered large and small bowel gas is noted. Feeding catheter is noted extending into the stomach and likely into the third portion of the duodenum. No other focal abnormality is noted. ECMO catheter is again noted and stable position. IMPRESSION: Feeding catheter extending into the stomach and likely into the third portion of the duodenum. Contrast injection might be helpful for confirmation. Electronically Signed   By: Alcide Clever M.D.   On: 04/26/2020 11:38   DG Swallowing Func-Speech  Pathology  Result Date: 04/27/2020 Objective Swallowing Evaluation: Type of Study: MBS-Modified Barium Swallow Study  Patient Details Name: Alex [name] MRN: [id number] Date of Birth: Oct 03, 1970 Today's Date: 04/27/2020 Time: SLP Start Time (ACUTE ONLY): 0940 -SLP Stop Time (ACUTE ONLY): 1020 SLP Time Calculation (min) (ACUTE ONLY): 40 min Past Medical History: No past medical history on file. Past Surgical History: Past Surgical History: Procedure Laterality Date . CENTRAL LINE INSERTION  02/19/2020  Procedure: CENTRAL LINE INSERTION;  Surgeon: Laurey Morale, MD;  Location: [facility] INVASIVE CV LAB;  Service: Cardiovascular;; . ECMO CANNULATION N/A 02/28/2020  Procedure: ECMO CANNULATION;  Surgeon: Laurey Morale, MD;  Location: [facility] INVASIVE CV LAB;  Service: Cardiovascular;  Laterality: N/A; . TEE WITHOUT CARDIOVERSION  02/27/2020  Procedure: TRANSESOPHAGEAL ECHOCARDIOGRAM (TEE);  Surgeon: Laurey Morale, MD;  Location: [facility] INVASIVE CV LAB;  Service: Cardiovascular;; . [place] teeth   HPI: 50 year old man admitted to hospital 12/28 with 1 week history of dyspnea cough nausea and vomiting.  Initially admitted to [facility] long hospital and placed on high flow nasal cannula but rapidly failed and required intubation 12/29.  Persistent hypoxic respiratory failure with PF ratio 55 in spite of 18 of PEEP FiO2 0.1 despite paralytics.  Did not improve with prone ventilation. Cannulated for VV ECMO 12/30 via right IJ crescent cannula. Iatrogenic pneumothorax from left subclavian triple-lumen placement. Trach on 1/6. Decannulated 04/16/20  Subjective: lethargic Assessment / Plan / Recommendation CHL IP CLINICAL IMPRESSIONS 04/27/2020 Clinical Impression Pt demonstrates mild oropharyngeal dysphagia with mild lethargy note today during study. He was able to orally manipulate all POs, but when masticating solid he needed cues to sustain attention and started drifting off to sleep. With verbal reminders he completed mastication. Pt  also struggled to coordinate pill with liquids; pill lodged briefly in valleculae. Pharyngeal function characterized by adequate strength, but instances of slightly delayed swallow initiation resulting in some flash and flash frank penetration. Even when pressed with large quantities with rapid intake, no aspiration occurred. Pt incidentally not coughing during assessment. Esophageal sweep appeared WNL. Recommend pt initaite thin liquids and mech soft solids. Meds crushed in puree. Pt can trial regular texture pleasure foods if he tolerates mech soft and is fully alert. SLP Visit Diagnosis Dysphagia, oropharyngeal phase (R13.12) Attention and concentration deficit following -- Frontal lobe and executive function deficit following -- Impact on safety and function Mild aspiration risk   CHL IP TREATMENT RECOMMENDATION 04/27/2020 Treatment Recommendations Therapy as outlined in treatment plan below   Prognosis 04/27/2020 Prognosis for Safe Diet Advancement Good Barriers to Reach Goals -- Barriers/Prognosis Comment -- CHL IP DIET RECOMMENDATION 04/27/2020 SLP Diet Recommendations Dysphagia 3 (Mech soft) solids;Thin liquid Liquid Administration via Straw;Cup Medication Administration Crushed with puree Compensations Slow rate;Small sips/bites Postural Changes Seated upright at 90 degrees   CHL IP OTHER RECOMMENDATIONS 04/27/2020 Recommended Consults -- Alex Care Recommendations Alex care BID Other Recommendations Have Alex suction available   CHL IP FOLLOW UP RECOMMENDATIONS 04/27/2020 Follow up Recommendations Inpatient Rehab   CHL IP FREQUENCY AND DURATION 04/27/2020 Speech Therapy Frequency (ACUTE ONLY) min 2x/week Treatment Duration 2 weeks      CHL IP Alex PHASE 04/27/2020 Alex Phase Impaired Alex - Pudding Teaspoon -- Alex - Pudding Cup -- Alex - Honey Teaspoon -- Alex - Honey Cup -- Alex - Nectar Teaspoon -- Alex - Nectar Cup -- Alex - Nectar Straw -- Alex - Thin Teaspoon -- Alex - Thin Cup -- Alex - Thin Straw WFL Alex  - Puree WFL Alex - Mech Soft Delayed Alex transit;Impaired mastication Alex - Regular -- Alex - Multi-Consistency -- Alex - Pill Decreased bolus cohesion;Delayed Alex transit;Reduced posterior propulsion Alex Phase - Comment --  CHL IP PHARYNGEAL PHASE 04/27/2020 Pharyngeal Phase Impaired Pharyngeal- Pudding Teaspoon -- Pharyngeal -- Pharyngeal- Pudding Cup -- Pharyngeal -- Pharyngeal- Honey Teaspoon -- Pharyngeal -- Pharyngeal- Honey Cup -- Pharyngeal -- Pharyngeal- Nectar Teaspoon -- Pharyngeal -- Pharyngeal- Nectar Cup -- Pharyngeal -- Pharyngeal- Nectar Straw -- Pharyngeal -- Pharyngeal- Thin Teaspoon -- Pharyngeal -- Pharyngeal- Thin Cup -- Pharyngeal -- Pharyngeal- Thin Straw Penetration/Aspiration before swallow;Delayed  swallow initiation-vallecula;Delayed swallow initiation-pyriform sinuses Pharyngeal Material enters airway, CONTACTS cords and then ejected out;Material enters airway, remains ABOVE vocal cords then ejected out;Material does not enter airway Pharyngeal- Puree Delayed swallow initiation-vallecula Pharyngeal -- Pharyngeal- Mechanical Soft Delayed swallow initiation-vallecula Pharyngeal -- Pharyngeal- Regular -- Pharyngeal -- Pharyngeal- Multi-consistency -- Pharyngeal -- Pharyngeal- Pill Delayed swallow initiation-vallecula Pharyngeal -- Pharyngeal Comment --  No flowsheet data found. Harlon Ditty, MA CCC-SLP Acute Rehabilitation Services Pager [phone] Office [phone] Claudine Mouton 04/27/2020, 11:22 AM              EEG adult  Result Date: 05/12/2020 Charlsie Quest, MD     05/12/2020  8:32 AM Patient Name: Butch Otterson MRN: [id number] Epilepsy Attending: Charlsie Quest Referring Physician/Provider: Dr Audie Box Date: 05/11/2020 Duration: 22.06 mins Patient history: 50 year old male with altered mental status.  EEG to evaluate for seizures. Level of alertness: comatose AEDs during EEG study: Gabapentin, Klonopin Technical aspects: This EEG study was done with scalp  electrodes positioned according to the 10-20 International system of electrode placement. Electrical activity was acquired at a sampling rate of 500Hz  and reviewed with a high frequency filter of 70Hz  and a low frequency filter of 1Hz . EEG data were recorded continuously and digitally stored. Description: EEG showed continuous generalized 3 to 6 Hz theta-delta slowing. Hyperventilation and photic stimulation were not performed.   ABNORMALITY -Continuous slow, generalized IMPRESSION: This study is suggestive of severe diffuse encephalopathy, nonspecific etiology. No seizures or epileptiform discharges were seen throughout the recording. Charlsie Quest   ECHO TEE  Result Date: 05/21/2020    TRANSESOPHOGEAL ECHO REPORT   Patient Name:   DORN HARTSHORNE Date of Exam: 05/03/2020 Medical Rec #:  [id number]      Height:       64.0 in Accession #:    [id number]     Weight:       171.7 lb Date of Birth:  Oct 11, 1970      BSA:          1.834 m Patient Age:    49 years       BP:           117/77 mmHg Patient Gender: M              HR:           100 bpm. Exam Location:  Inpatient Procedure: Transesophageal Echo Indications:     ecmo position check  History:         Patient has prior history of Echocardiogram examinations, most                  recent 04/19/2020.  Sonographer:     Delcie Roch Referring Phys:  [id number] [name] MCLEAN Diagnosing Phys: Marca Ancona MD PROCEDURE: The transesophogeal probe was passed without difficulty through the esophogus of the patient. Sedation performed by performing physician. The patient developed no complications during the procedure. IMPRESSIONS  1. Left ventricular ejection fraction, by estimation, is 60 to 65%. The left ventricle has normal function. The left ventricle has no regional wall motion abnormalities.  2. Right ventricular systolic function is normal. The right ventricular size is normal.  3. No left atrial/left atrial appendage thrombus was detected.  4. Right atrial size  was mildly dilated. ECMO catheter in RA.  5. The mitral valve is normal in structure. Trivial mitral valve regurgitation. No evidence of mitral stenosis.  6. The aortic valve is tricuspid. Aortic valve regurgitation is not visualized.  No aortic stenosis is present.  7. The ECMO catheter appeared to be deep, was withdrawn about 2 cm by Dr. Vickey Sages. Flow was then directed towards the tricuspid valve. FINDINGS  Left Ventricle: Left ventricular ejection fraction, by estimation, is 60 to 65%. The left ventricle has normal function. The left ventricle has no regional wall motion abnormalities. The left ventricular internal cavity size was normal in size. There is  no left ventricular hypertrophy. Right Ventricle: The right ventricular size is normal. No increase in right ventricular wall thickness. Right ventricular systolic function is normal. Left Atrium: Left atrial size was normal in size. No left atrial/left atrial appendage thrombus was detected. Right Atrium: Right atrial size was mildly dilated. ECMO catheter in RA. Pericardium: Trivial pericardial effusion is present. Mitral Valve: The mitral valve is normal in structure. Trivial mitral valve regurgitation. No evidence of mitral valve stenosis. Tricuspid Valve: The tricuspid valve is normal in structure. Tricuspid valve regurgitation is trivial. Aortic Valve: The aortic valve is tricuspid. Aortic valve regurgitation is not visualized. No aortic stenosis is present. Pulmonic Valve: The pulmonic valve was normal in structure. Pulmonic valve regurgitation is not visualized. Aorta: The aortic root is normal in size and structure. IAS/Shunts: No atrial level shunt detected by color flow Doppler. Marca Ancona MD Electronically signed by Marca Ancona MD Signature Date/Time: 05/21/2020/5:01:56 PM    Final    VAS [place] UPPER EXTREMITY VENOUS DUPLEX  Result Date: 04/28/2020 UPPER VENOUS STUDY  Indications: Swelling Risk Factors: None identified. Comparison Study: No  previous RUE venous Performing Technologist: Clint Guy RVT  Examination Guidelines: A complete evaluation includes B-mode imaging, spectral Doppler, color Doppler, and power Doppler as needed of all accessible portions of each vessel. Bilateral testing is considered an integral part of a complete examination. Limited examinations for reoccurring indications may be performed as noted.  Right Findings: +----------+------------+---------+-----------+----------+--------------+ RIGHT     CompressiblePhasicitySpontaneousProperties   Summary     +----------+------------+---------+-----------+----------+--------------+ IJV                                                 Not visualized +----------+------------+---------+-----------+----------+--------------+ Subclavian               Yes       Yes                             +----------+------------+---------+-----------+----------+--------------+ Axillary      Full       Yes       Yes                             +----------+------------+---------+-----------+----------+--------------+ Brachial      Full       Yes       Yes                             +----------+------------+---------+-----------+----------+--------------+ Radial        Full                                                 +----------+------------+---------+-----------+----------+--------------+ Ulnar  Full                                                 +----------+------------+---------+-----------+----------+--------------+ Cephalic      Full                                                 +----------+------------+---------+-----------+----------+--------------+ Basilic                                             Not visualized +----------+------------+---------+-----------+----------+--------------+  Summary:  Right: No evidence of deep vein thrombosis in the upper extremity. No evidence of superficial vein thrombosis in the upper  extremity. However, Unable to visualize IJV due to ECMO and basilic due to arm position.  *See table(s) above for measurements and observations.  Diagnosing physician: Coral Else MD Electronically signed by Coral Else MD on 04/28/2020 at 9:51:11 PM.    Final    [place] EKG SITE RITE  Result Date: 05/10/2020 [facility] image not attached, placement could not be confirmed due to current cardiac rhythm.   Microbiology No results found for this or any previous visit (from the past 240 hour(s)).  Lab Basic Metabolic Panel: No results for input(s): NA, K, CL, CO2, GLUCOSE, BUN, CREATININE, CALCIUM, MG, PHOS in the last 168 hours. Liver Function Tests: No results for input(s): AST, ALT, ALKPHOS, BILITOT, PROT, ALBUMIN in the last 168 hours. No results for input(s): LIPASE, AMYLASE in the last 168 hours. No results for input(s): AMMONIA in the last 168 hours. CBC: No results for input(s): WBC, NEUTROABS, HGB, HCT, MCV, PLT in the last 168 hours. Cardiac Enzymes: No results for input(s): CKTOTAL, CKMB, CKMBINDEX, TROPONINI in the last 168 hours. Sepsis Labs: No results for input(s): PROCALCITON, WBC, LATICACIDVEN in the last 168 hours.  Procedures/Operations  Intubation, mechanical ventilation, central line placement, arterial line placement, VV ECMO cannulation and daily care.   Charish Schroepfer 05/24/2020, 10:55 AM

## 2020-06-11 NOTE — Progress Notes (Signed)
Critical Care Progress Note  NAME:  Alex [name], MRN:  [id number], DOB:  [birth year], LOS: 66 ADMISSION DATE:  02/24/2020, CONSULTATION DATE: 2020/04/04 REFERRING MD: Wynona Neat -LBPCCM, CHIEF COMPLAINT: Respiratory failure requiring ECMO  HPI/course in hospital  50 year old man admitted to hospital 12/28 with 1 week history of dyspnea cough nausea and vomiting.  Initially admitted to [facility] long hospital and placed on high flow nasal cannula but rapidly failed and required intubation 12/29.  Persistent hypoxic respiratory failure with PF ratio 55 in spite of 18 of PEEP FiO2 0.1 despite paralytics.  Did not improve with prone ventilation  Cannulated for VV ECMO 12/30 via right IJ crescent cannula.  ECMO circuit was changed on 04/07/2020 Iatrogenic pneumothorax from left subclavian triple-lumen placement  12/28 admitted covid, ARDS 12/30 ECMO cannulation, iatrogenic pneumothrorax, DVT, Bivalrudin started           Left subclavian hematoma, chest tube, ceftriaxone/ azithromycin started 12/31 1Unit PRBC 1/1 Palliative consult, noted HTN, fentanyl only 1/2 Extubation I/O positive, left lung re-expanded, EF 60-65%, Lasix 20mg  BID 1/3 BiPAP in place, HTN to 190s, 200s, labetolol for BP, I/O even, cefepime and vancomycin started 1/4 agitation off BiPAP, possible aspiration 1/5 agitation overnight, reintubated      ECHO with RV dilation, ECMO cannula repositioned, LUE DVT (+), 1/2 dose tPA given,      I/O up, on lasix, Epi, NE, vasopressin started, HR/ rhythm issues noted 1/6 Tracheostomy, hypoglycemic when off tube feeds       Cortrack tune issues 1/7 on lasix gtt 4mg /hr with diuresis, agitation improved 1/8 starting steroid taper 1/9 severe agitation, heavy sedation required, lasix gtt 6mg /hr, I/Os (-)       precedex and fentanyl 1/10 Vanc stopped 1/11 sweep to 2, lasix gtt at 4mg /hr, weight up, metoprolol added for HTN 1/12 fevers to 99 noted, lasix gtt and metazolone 1/13 lasix gtt  dced 1/14 intermittent HTN, possibly flash pulmonary edema tied to sedation        Ischemic left hand changes, 1 Unit PRBCs 1/15 1/16 sweep from 4 to 2.5, chest tube removed 1/17 sweep at 6, trial of nebulized morphine for cough, LUE dopplers show no obstruction 1/18 two events of 2nd degree type II HB noted with coughing         sweep at 4, IV lasix 40, acetazolamide, distal XLT trach placement         agitation and BP spikes 1/19 agitation, air hunger, seroquel, klonopin, oxy, valproate, ketamine trial 1/20 sweep at 3.5, diuresis results in chugging, albumin given         1 Unit PRBCs, lidocaine nebulizers for cough, ancef started 1/21 sweep to 8, anxiety, I/Os (+) with lasix and acetazolamide 1/22 sweep at 5, chest CT bilateral upper lobe PEs 1/23 sweep at 5, delirium, I/Os even with lasix and acetazolamide         continued coughing, 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 1/24 sweep at 7.5, 1 Unit PRBCs, repeat bronchoscopy showed resolution of 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 1/25 sweep at 7.5, on propofol, lasix gtt at 66ml/hr, I/Os (+)         nebulized morphine and lidocaine for cough 1/26 sweep at 8, propofol/ precedex, cough present when weaned, lasix gtt at 64ml/hr        ECMO circuit changes, levophed started, 1 Unit PRBCs 1/27 sweep at 4, off sedation, delirium, I/Os even        lasix gtt restated, acetazolamide overnight 1/28 sweep at 7.5,  HTN labile 1/29 sweep of 6, patient reports a tickle in throat, cetacaine 1/30 sweep down to 6, cough improved with cetacaine and gabapentin 1/31 sweep at 5, agitation, off acetazolamide, lasix, weight up 2/1 cough, of pulmicort, cetacaine for cough, continued gabapentin, dexmethorphan       CT demonstrated trach placement against back of trachea 2/2 Trach collar trial somewhat effective in managing cough       low dose diltiazem for HR,  2/3 sweep at 4,trach cannula replacement (longer, more flexible bivona); brochoscopy  shows irritated mucosa       IV lasix and acetazolamide 2/4 sweep at 5, I/Os (+), BUN elevated, lasix gtt with diamox,       trach decannulation, increased gabapentin for cough 2/5 start HCAP coverage for rising WBC/temps, check Pct/cultures, CXR worse with lack of PEEP, diuresed well but started getting very hypotensive, sepsis vs. Too dry 04/18/20 blood cx grew E faecalis 2/7 PICC change, TEE 2/7>> sweep weans 2/22 Cannula adjustment, circuit change 2/25 up walking in the hallway with physical therapy 2/27 Air entrainment, air embolism  2/28 sweep trial on 0 , planning for decannulation vvECMO 3/1 MRI anoxic brain injury  3/2 comfort care measures  3/3 deemed not candidate for donation   Past Medical History  none  Interim history/subjective:   Comfort care   Objective   Blood pressure (Abnormal) 107/58, pulse 93, temperature 97.6 F (36.4 C), temperature source Axillary, resp. rate (Abnormal) 34, height 5\' 4"  (1.626 m), weight 77.2 kg, SpO2 98 %.    Vent Mode: PCV FiO2 (%):  [70 %-100 %] 70 % Set Rate:  [32 bmp] 32 bmp PEEP:  [10 cmH20] 10 cmH20 Plateau Pressure:  [26 cmH20-33 cmH20] 27 cmH20   Intake/Output Summary (Last 24 hours) at Jun 05, 2020 0945 Last data filed at Jun 05, 2020 0500 Gross per 24 hour  Intake 397.19 ml  Output 1135 ml  Net -737.81 ml   Filed Weights   05/08/20 0700 05/11/20 0600 05/12/20 0600  Weight: 77 kg 80.6 kg 77.2 kg    Examination: General eyes open to voice but not following commands. Still on high level support HENT trach site unremarkable Pulm rapid rr dec bases Card rrr abd soft Ext warm Neuro eyes open. No movement of ext. Not following commands   Assessment & Plan:   Anoxic brain injury Acute encephalopathy,  Acute delirium, with agitation. Acute hypoxemic/hypercapnic respiratory failure due to severe ARDS from COVID-19 pneumonia Probable acute PE, and RV dysfunction s/p TPA Refractory coughing Strep group F/ enterococcal  pneumonia R effusion 05/09/2020: Acute air pulmonary embolism, air entrainment from around catheter insertion site 05/10/2020: De-cannulated from [facility]  HTN Hyponatremia Sepsis secondary to E faecalis bacteremia 05/09/2020 During periarrest event likely significant aspiration. 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 Family had been deciding on organ donation vs one-way extubation. Unfortunately turned down by Organ donation   Plan Cont current support No further escalation Comfort care Anticipate one-way extubation later today or when his son comes back   Daily Goals Checklist  Pain/Anxiety/Delirium protocol (if indicated): see above VAP protocol (if indicated):  n/a DVT prophylaxis: bivalirudin GI prophylaxis: pantoprazole Glucose control: basal bolus  insulin Mobility/therapy needs: Mobilization as tolerated Code Status: DNR Disposition: ICU  Simonne Martinet ACNP-BC [facility] Pulmonary/Critical Care Pager # [phone] OR # [phone] if no answer

## 2020-06-11 DEATH — deceased

## 2021-05-16 IMAGING — DX DG ABDOMEN 1V
1 series · 1 of 1 positions shown · non-contrast
Comparison: Portable exam 4666 hours compared to 03/26/2020

CLINICAL DATA: Nausea and vomiting

EXAM:
ABDOMEN - 1 VIEW

[abdomen]
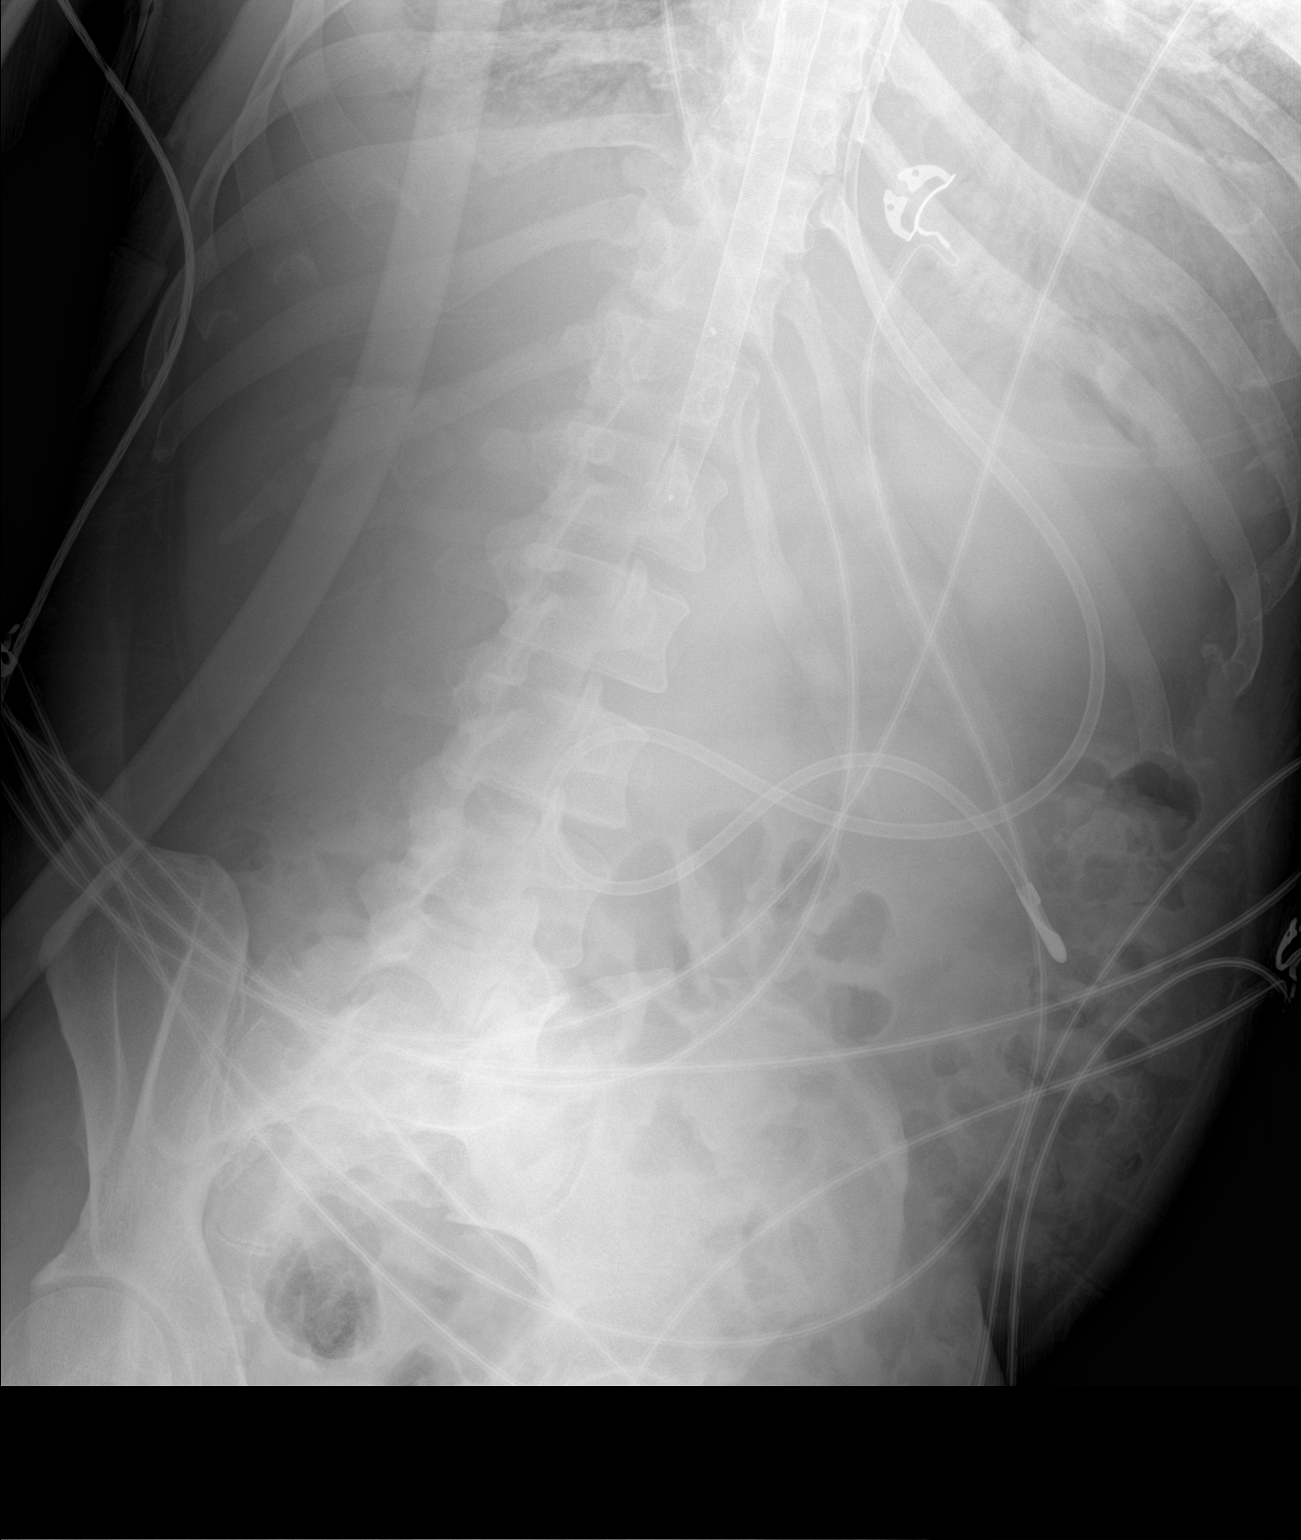

[1 of 1 positions shown; findings below may reference images not displayed]

FINDINGS: Tip of feeding tube projects over the proximal jejunum in the LEFT
upper quadrant.

Severely rotated to the LEFT.

ECMO catheter within IVC.

Bowel gas pattern normal.

No osseous abnormalities.
IMPRESSION: Tip of feeding tube projects over jejunal loops in the LEFT upper
quadrant.

Nonobstructive bowel gas pattern.

## 2021-05-17 IMAGING — DX DG CHEST 1V PORT
1 series · 1 of 1 positions shown · non-contrast
Comparison: 04/10/2020

CLINICAL DATA: Respiratory failure

EXAM:
PORTABLE CHEST 1 VIEW

[chest]
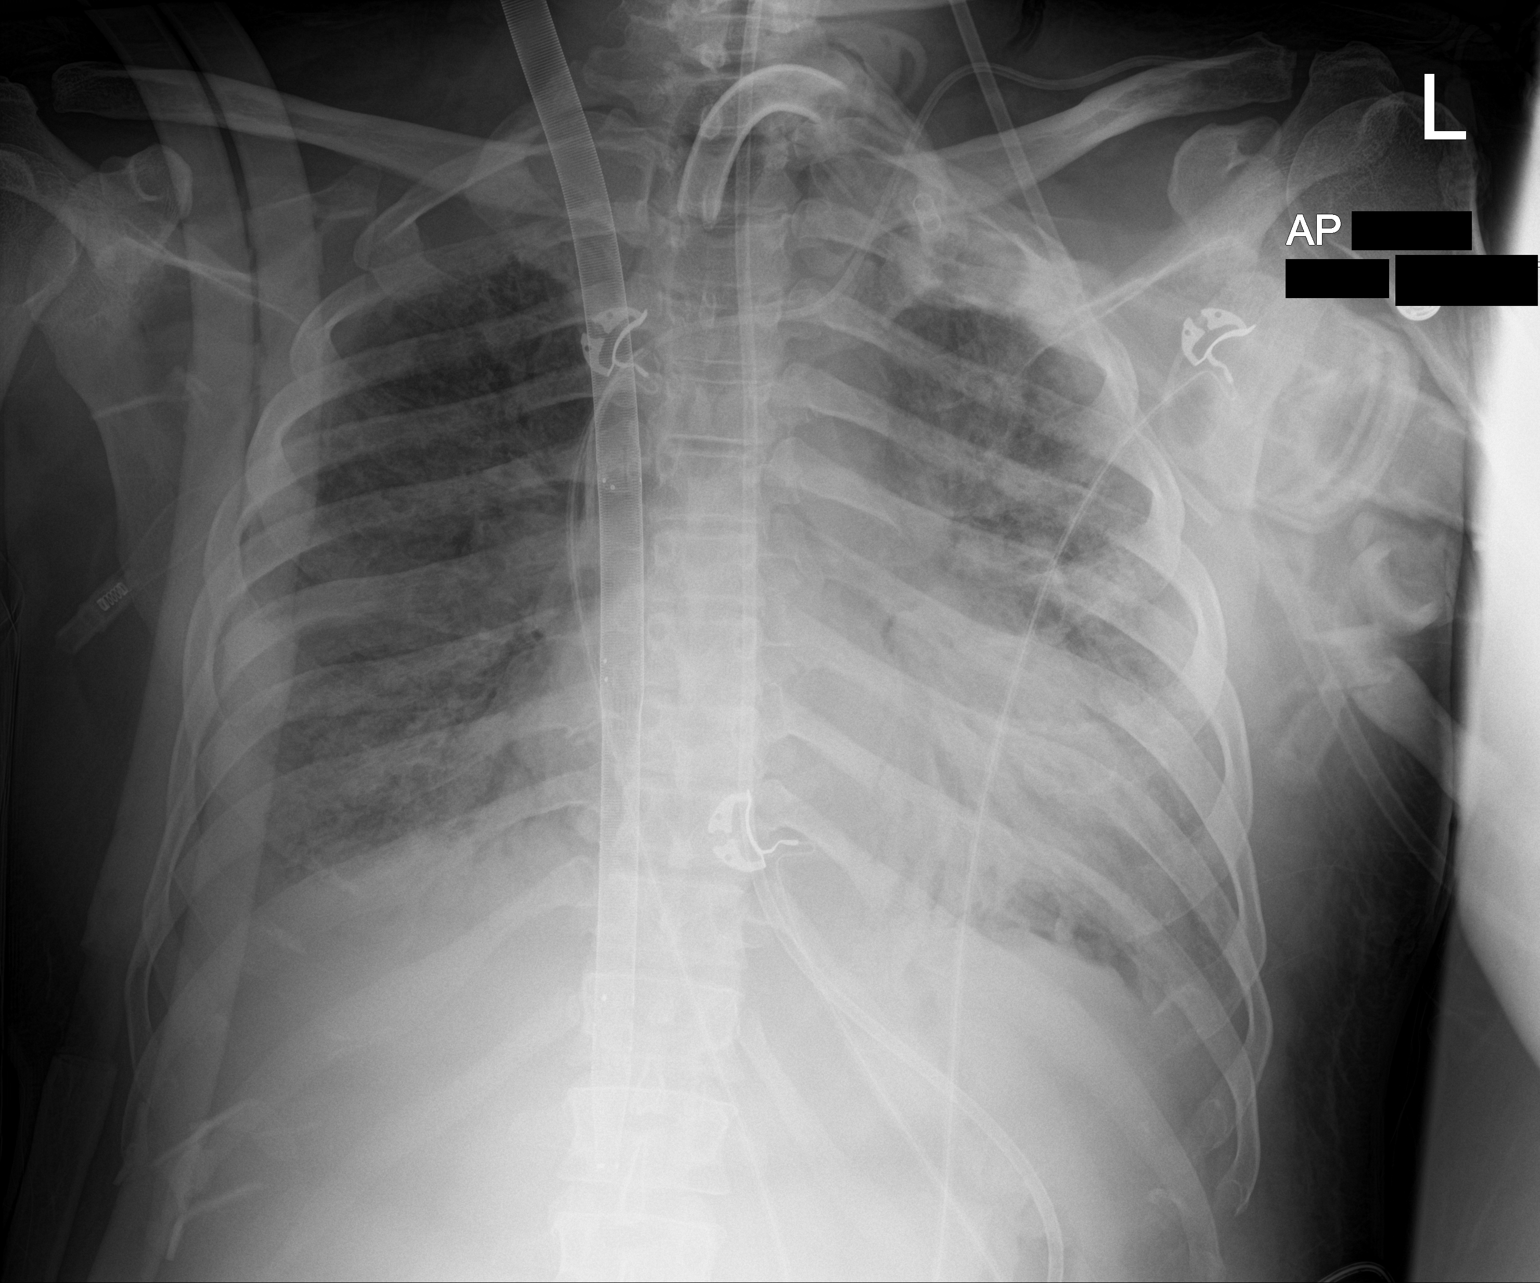

[1 of 1 positions shown; findings below may reference images not displayed]

FINDINGS: Tracheostomy tube, feeding tube and central venous line unchanged.
Stable diffuse dense bilateral airspace disease. No pneumothorax.
Pneumomediastinum.

Large bore ECMO catheter unchanged.
IMPRESSION: 1. Stable support apparatus.
2. No change in diffuse dense airspace disease.

## 2021-05-20 IMAGING — DX DG CHEST 1V PORT
1 series · 1 of 1 positions shown · non-contrast
Comparison: CT chest 04/13/2020.  Chest x-ray 05/03/2020.

CLINICAL DATA: Tracheostomy.  ECMO.  COVID

EXAM:
PORTABLE CHEST 1 VIEW

[chest ap]
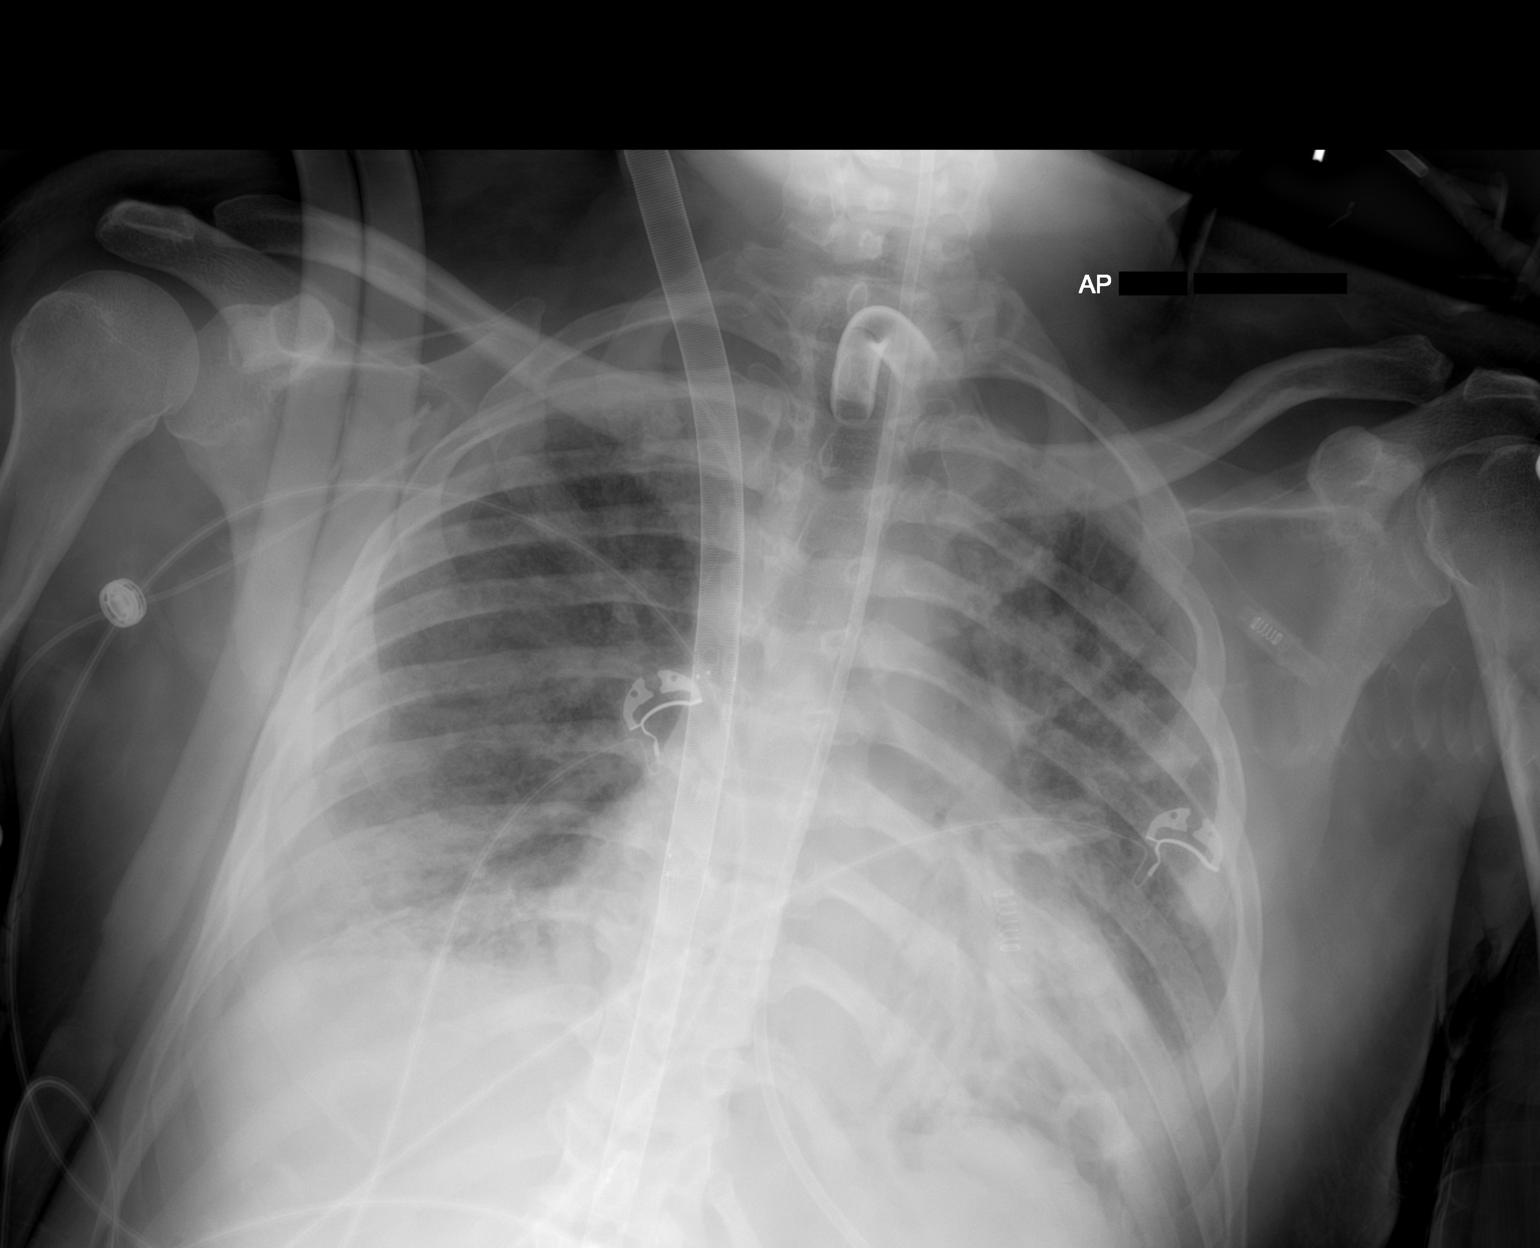

[1 of 1 positions shown; findings below may reference images not displayed]

FINDINGS: Tracheostomy tube, feeding tube, right PICC line, ECMO device in
stable position. Heart size stable. Unchanged dense bilateral
pulmonary infiltrates. Small bilateral pleural effusions again
noted. No pneumothorax.
IMPRESSION: 1. Lines and tubes in stable position.
2. Unchanged dense bilateral pulmonary infiltrates. Small bilateral
pleural effusions again noted.

## 2021-05-22 IMAGING — DX DG ABD PORTABLE 1V
1 series · 1 of 1 positions shown · non-contrast
Comparison: 04/10/2020

CLINICAL DATA: Nasoenteric feeding tube repositioning

EXAM:
PORTABLE ABDOMEN - 1 VIEW

[abdomen kub]
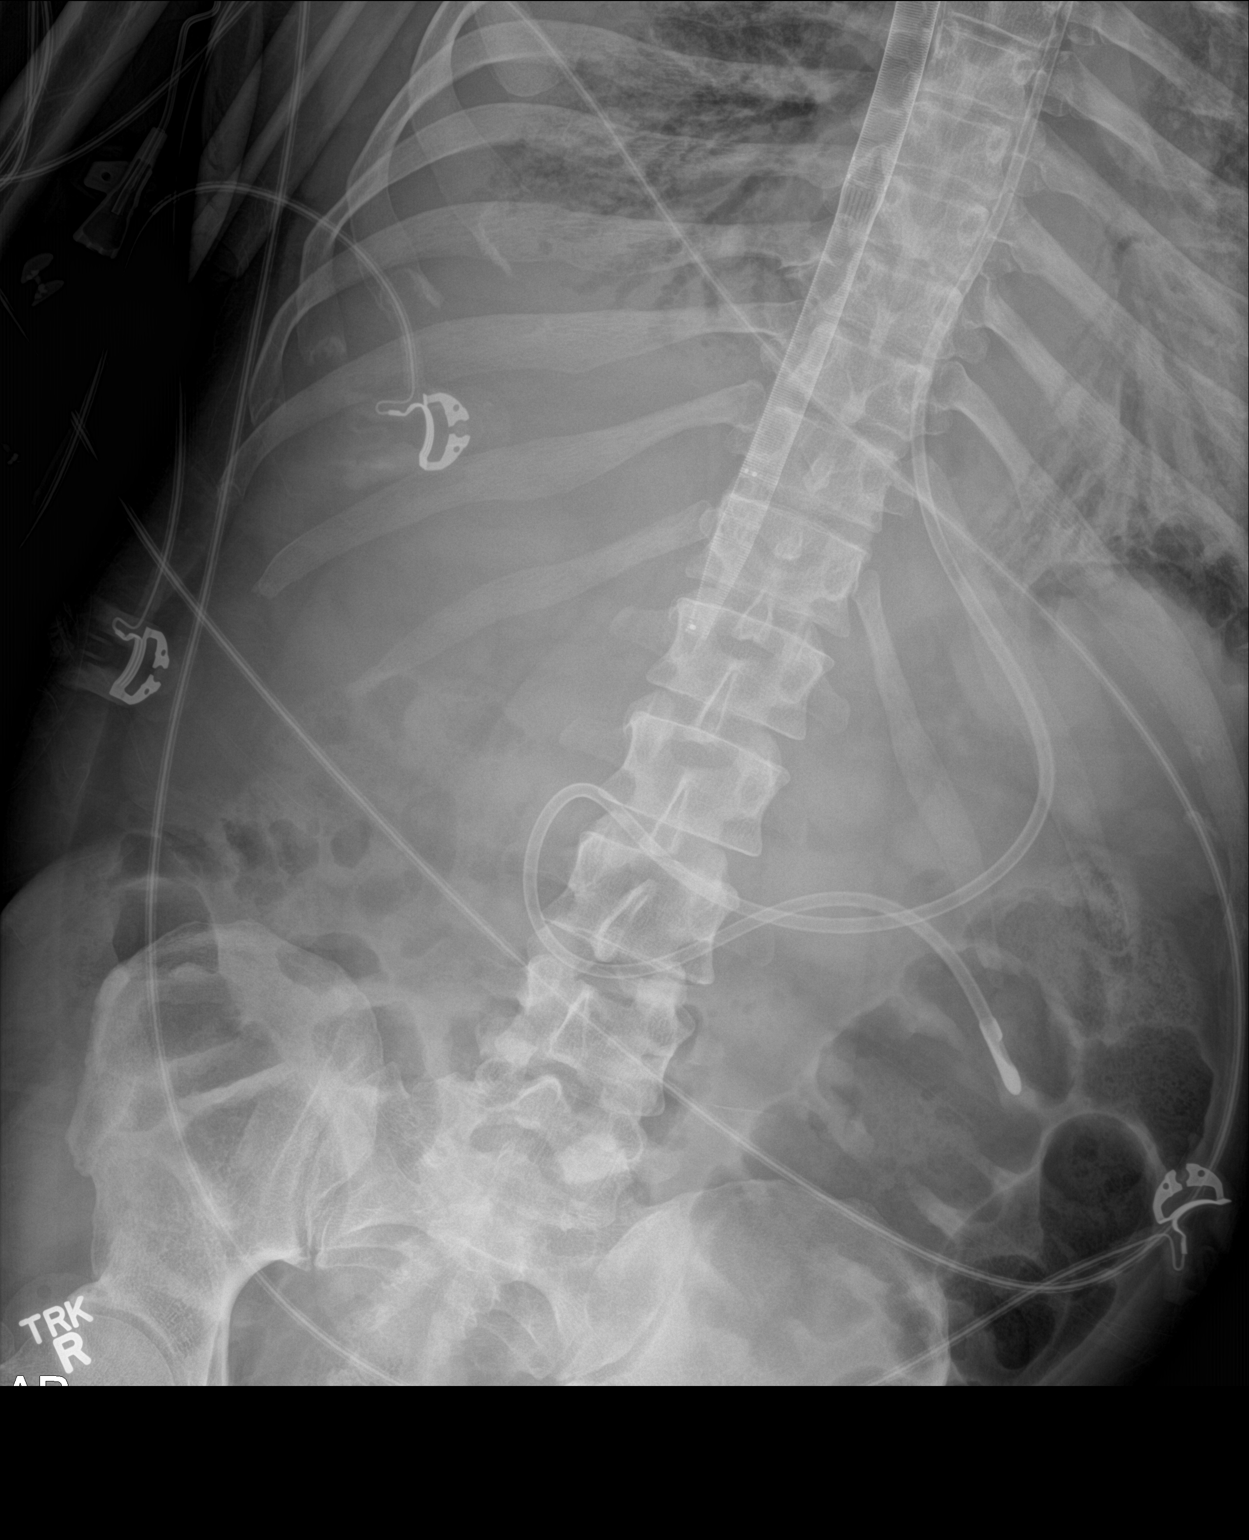

[1 of 1 positions shown; findings below may reference images not displayed]

FINDINGS: Nasoenteric feeding tube is seen with its tip overlying the expected
proximal jejunum, similar to that noted on prior examination. Normal
abdominal gas pattern. ECMO cannula overlies its expected position
bibasilar pulmonary infiltrates with air bronchograms and small
right pleural effusion again noted.
IMPRESSION: Nasoenteric feeding tube tip within the expected proximal jejunum.

## 2021-05-24 IMAGING — DX DG CHEST 1V PORT
1 series · 1 of 1 positions shown · non-contrast
Comparison: One-view chest x-ray 04/17/2020

CLINICAL DATA: ECMO.

EXAM:
PORTABLE CHEST 1 VIEW

[chest ap]
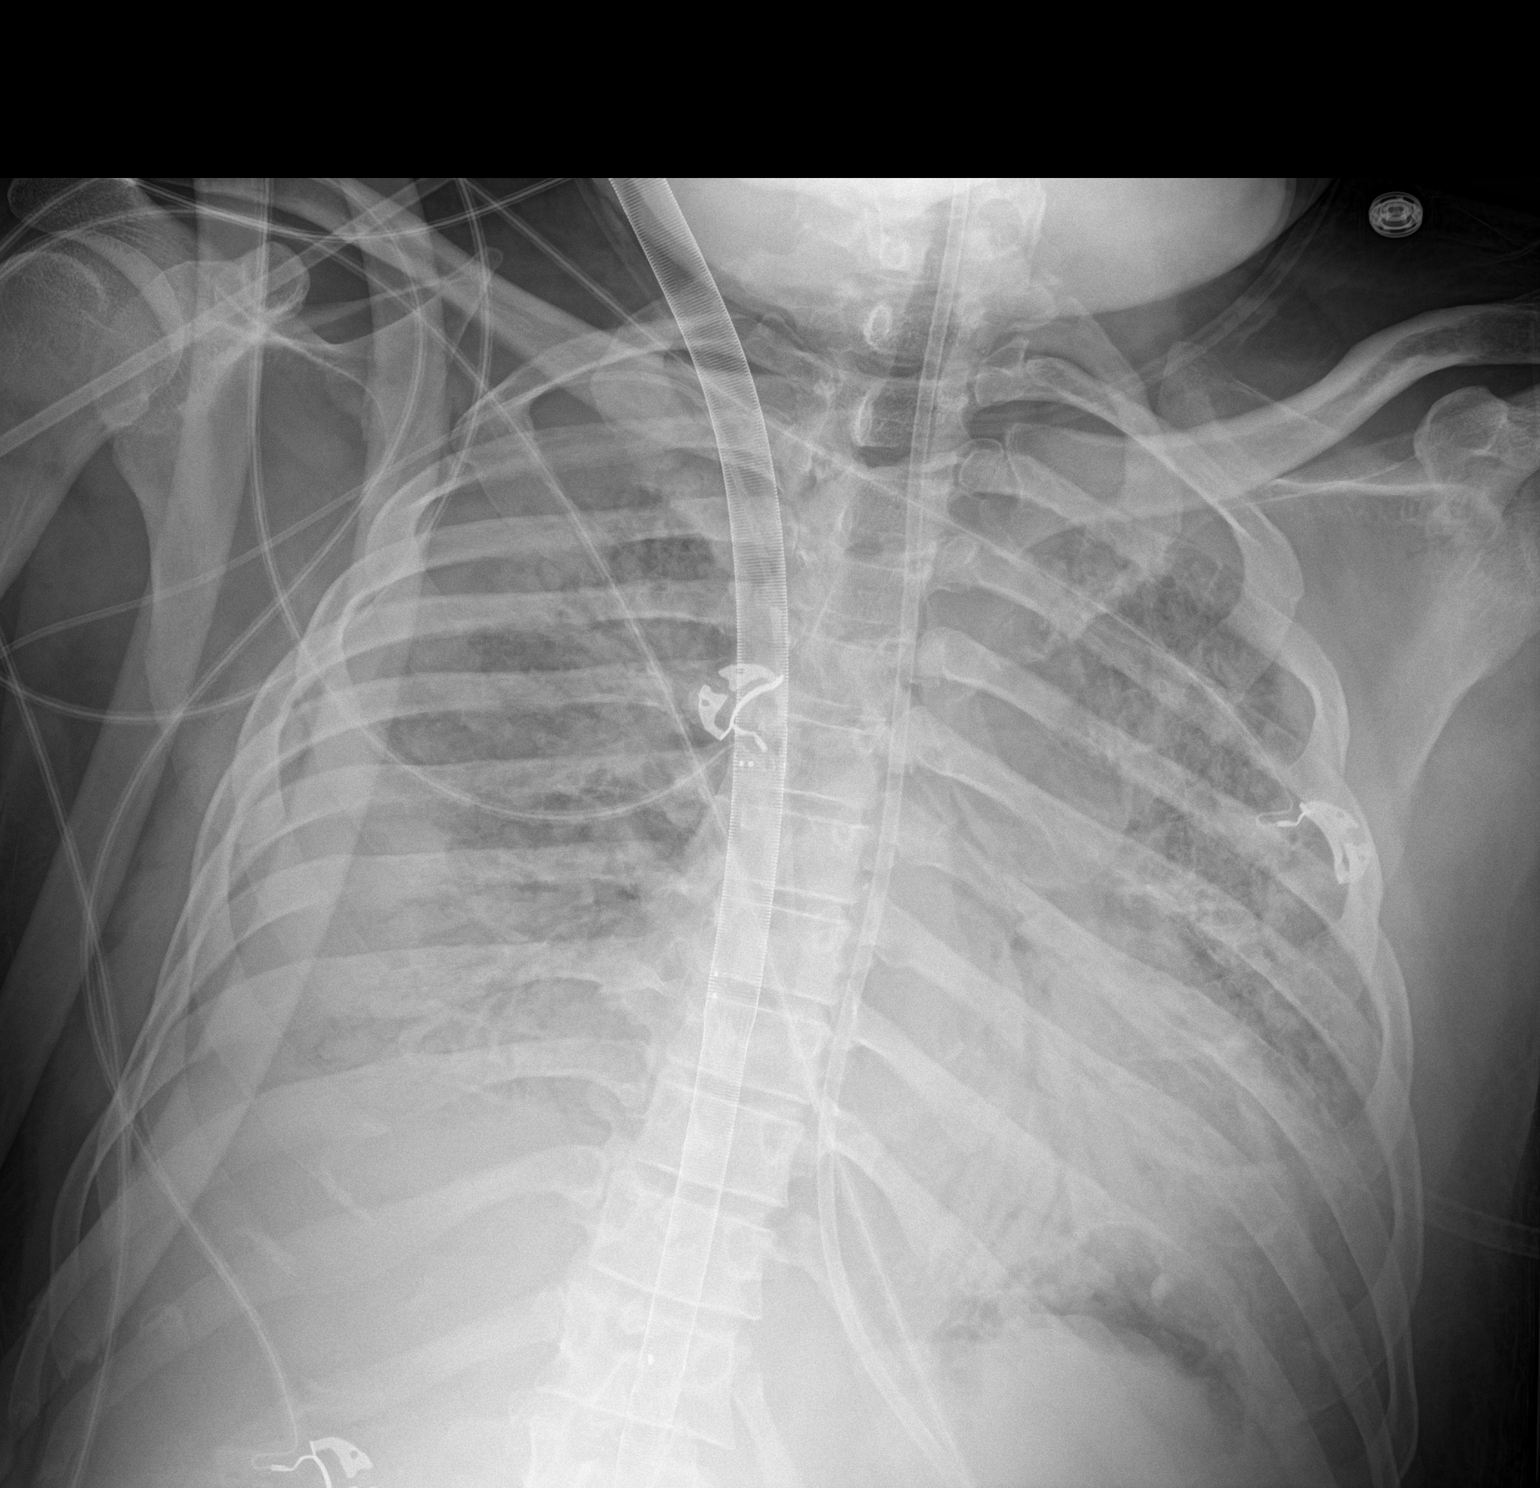

[1 of 1 positions shown; findings below may reference images not displayed]

FINDINGS: Heart is enlarged. ECMO catheter in stable position. Feeding tube
courses off the inferior border of the film.

Diffuse bilateral airspace opacities are again noted. Right pleural
effusion evident. There is no significant interval change.
IMPRESSION: Stable cardiomegaly and diffuse bilateral airspace opacities.
Findings consistent with congestive heart failure or infection.
Support apparatus including ECMO catheter is stable.
